# Patient Record
Sex: Female | Born: 1945
Health system: Southern US, Community
[De-identification: ages and names within clinical notes are randomized; demographics above are authoritative.]

## PROBLEM LIST (undated history)

## (undated) DIAGNOSIS — I700 Atherosclerosis of aorta: Secondary | ICD-10-CM

## (undated) DIAGNOSIS — Z87442 Personal history of urinary calculi: Secondary | ICD-10-CM

## (undated) DIAGNOSIS — I69354 Hemiplegia and hemiparesis following cerebral infarction affecting left non-dominant side: Secondary | ICD-10-CM

## (undated) DIAGNOSIS — E538 Deficiency of other specified B group vitamins: Secondary | ICD-10-CM

## (undated) DIAGNOSIS — M069 Rheumatoid arthritis, unspecified: Secondary | ICD-10-CM

## (undated) DIAGNOSIS — D509 Iron deficiency anemia, unspecified: Secondary | ICD-10-CM

## (undated) DIAGNOSIS — C801 Malignant (primary) neoplasm, unspecified: Secondary | ICD-10-CM

## (undated) DIAGNOSIS — K219 Gastro-esophageal reflux disease without esophagitis: Secondary | ICD-10-CM

## (undated) DIAGNOSIS — M549 Dorsalgia, unspecified: Secondary | ICD-10-CM

## (undated) DIAGNOSIS — E119 Type 2 diabetes mellitus without complications: Secondary | ICD-10-CM

## (undated) DIAGNOSIS — G5601 Carpal tunnel syndrome, right upper limb: Secondary | ICD-10-CM

## (undated) DIAGNOSIS — J45909 Unspecified asthma, uncomplicated: Secondary | ICD-10-CM

## (undated) DIAGNOSIS — E1140 Type 2 diabetes mellitus with diabetic neuropathy, unspecified: Secondary | ICD-10-CM

## (undated) DIAGNOSIS — I10 Essential (primary) hypertension: Secondary | ICD-10-CM

## (undated) DIAGNOSIS — IMO0001 Reserved for inherently not codable concepts without codable children: Secondary | ICD-10-CM

## (undated) DIAGNOSIS — M659 Unspecified synovitis and tenosynovitis, unspecified site: Secondary | ICD-10-CM

## (undated) DIAGNOSIS — C679 Malignant neoplasm of bladder, unspecified: Secondary | ICD-10-CM

## (undated) DIAGNOSIS — D494 Neoplasm of unspecified behavior of bladder: Secondary | ICD-10-CM

## (undated) DIAGNOSIS — M1990 Unspecified osteoarthritis, unspecified site: Secondary | ICD-10-CM

## (undated) DIAGNOSIS — F329 Major depressive disorder, single episode, unspecified: Secondary | ICD-10-CM

## (undated) DIAGNOSIS — E785 Hyperlipidemia, unspecified: Secondary | ICD-10-CM

## (undated) DIAGNOSIS — G629 Polyneuropathy, unspecified: Secondary | ICD-10-CM

## (undated) DIAGNOSIS — R319 Hematuria, unspecified: Secondary | ICD-10-CM

## (undated) DIAGNOSIS — R079 Chest pain, unspecified: Secondary | ICD-10-CM

## (undated) DIAGNOSIS — G4700 Insomnia, unspecified: Secondary | ICD-10-CM

## (undated) DIAGNOSIS — R0600 Dyspnea, unspecified: Secondary | ICD-10-CM

## (undated) DIAGNOSIS — J449 Chronic obstructive pulmonary disease, unspecified: Secondary | ICD-10-CM

## (undated) DIAGNOSIS — M8580 Other specified disorders of bone density and structure, unspecified site: Secondary | ICD-10-CM

## (undated) DIAGNOSIS — H269 Unspecified cataract: Secondary | ICD-10-CM

## (undated) DIAGNOSIS — M5090 Cervical disc disorder, unspecified, unspecified cervical region: Secondary | ICD-10-CM

## (undated) DIAGNOSIS — I6529 Occlusion and stenosis of unspecified carotid artery: Secondary | ICD-10-CM

## (undated) DIAGNOSIS — I639 Cerebral infarction, unspecified: Secondary | ICD-10-CM

## (undated) DIAGNOSIS — D649 Anemia, unspecified: Secondary | ICD-10-CM

## (undated) DIAGNOSIS — F32A Depression, unspecified: Secondary | ICD-10-CM

## (undated) DIAGNOSIS — G4730 Sleep apnea, unspecified: Secondary | ICD-10-CM

## (undated) HISTORY — DX: Type 2 diabetes mellitus without complications: E11.9

## (undated) HISTORY — DX: Unspecified synovitis and tenosynovitis, unspecified site: M65.90

## (undated) HISTORY — DX: Cervical disc disorder, unspecified, unspecified cervical region: M50.90

## (undated) HISTORY — DX: Other specified disorders of bone density and structure, unspecified site: M85.80

## (undated) HISTORY — DX: Depression, unspecified: F32.A

## (undated) HISTORY — DX: Polyneuropathy, unspecified: G62.9

## (undated) HISTORY — DX: Unspecified cataract: H26.9

## (undated) HISTORY — DX: Type 2 diabetes mellitus with diabetic neuropathy, unspecified: E11.40

## (undated) HISTORY — DX: Rheumatoid arthritis, unspecified: M06.9

## (undated) HISTORY — PX: NECK SURGERY: SHX720

## (undated) HISTORY — DX: Insomnia, unspecified: G47.00

## (undated) HISTORY — DX: Synovitis and tenosynovitis, unspecified: M65.9

## (undated) HISTORY — PX: OVARIAN CYST REMOVAL: SHX89

## (undated) HISTORY — PX: BREAST BIOPSY: SHX20

## (undated) HISTORY — DX: Unspecified osteoarthritis, unspecified site: M19.90

## (undated) HISTORY — DX: Gastro-esophageal reflux disease without esophagitis: K21.9

## (undated) HISTORY — DX: Cerebral infarction, unspecified: I63.9

## (undated) HISTORY — DX: Chronic obstructive pulmonary disease, unspecified: J44.9

## (undated) HISTORY — DX: Sleep apnea, unspecified: G47.30

## (undated) HISTORY — DX: Major depressive disorder, single episode, unspecified: F32.9

## (undated) HISTORY — DX: Chest pain, unspecified: R07.9

## (undated) HISTORY — DX: Hyperlipidemia, unspecified: E78.5

## (undated) HISTORY — DX: Dorsalgia, unspecified: M54.9

## (undated) HISTORY — DX: Reserved for inherently not codable concepts without codable children: IMO0001

---

## 2004-02-03 ENCOUNTER — Other Ambulatory Visit: Payer: Self-pay

## 2005-09-25 ENCOUNTER — Other Ambulatory Visit: Payer: Self-pay

## 2006-01-16 LAB — HM COLONOSCOPY: HM Colonoscopy: NORMAL

## 2006-09-24 ENCOUNTER — Other Ambulatory Visit: Payer: Self-pay

## 2008-04-05 ENCOUNTER — Emergency Department: Payer: Self-pay | Admitting: Emergency Medicine

## 2008-04-05 ENCOUNTER — Other Ambulatory Visit: Payer: Self-pay

## 2008-04-26 ENCOUNTER — Emergency Department: Payer: Self-pay | Admitting: Emergency Medicine

## 2008-07-04 ENCOUNTER — Ambulatory Visit: Payer: Self-pay | Admitting: Family Medicine

## 2008-11-04 ENCOUNTER — Ambulatory Visit: Payer: Self-pay | Admitting: Family Medicine

## 2008-12-08 ENCOUNTER — Ambulatory Visit: Payer: Self-pay | Admitting: Unknown Physician Specialty

## 2008-12-15 ENCOUNTER — Ambulatory Visit: Payer: Self-pay | Admitting: Unknown Physician Specialty

## 2009-11-09 ENCOUNTER — Emergency Department: Payer: Self-pay | Admitting: Emergency Medicine

## 2009-11-12 ENCOUNTER — Emergency Department: Payer: Self-pay | Admitting: Internal Medicine

## 2010-08-04 LAB — HM DEXA SCAN: HM Dexa Scan: NORMAL

## 2010-12-02 ENCOUNTER — Emergency Department: Payer: Self-pay | Admitting: Emergency Medicine

## 2011-06-03 ENCOUNTER — Emergency Department: Payer: Self-pay | Admitting: Unknown Physician Specialty

## 2012-06-20 DIAGNOSIS — Z23 Encounter for immunization: Secondary | ICD-10-CM | POA: Diagnosis not present

## 2013-02-14 ENCOUNTER — Ambulatory Visit: Payer: Self-pay | Admitting: Family Medicine

## 2013-03-14 ENCOUNTER — Ambulatory Visit: Payer: Self-pay | Admitting: Family Medicine

## 2013-06-10 DIAGNOSIS — J449 Chronic obstructive pulmonary disease, unspecified: Secondary | ICD-10-CM | POA: Diagnosis not present

## 2013-06-10 DIAGNOSIS — E785 Hyperlipidemia, unspecified: Secondary | ICD-10-CM | POA: Diagnosis not present

## 2013-06-10 DIAGNOSIS — Z23 Encounter for immunization: Secondary | ICD-10-CM | POA: Diagnosis not present

## 2013-06-10 DIAGNOSIS — IMO0001 Reserved for inherently not codable concepts without codable children: Secondary | ICD-10-CM | POA: Diagnosis not present

## 2013-06-10 DIAGNOSIS — Z1331 Encounter for screening for depression: Secondary | ICD-10-CM | POA: Diagnosis not present

## 2013-09-04 DIAGNOSIS — G4700 Insomnia, unspecified: Secondary | ICD-10-CM | POA: Diagnosis not present

## 2013-09-04 DIAGNOSIS — IMO0001 Reserved for inherently not codable concepts without codable children: Secondary | ICD-10-CM | POA: Diagnosis not present

## 2013-09-04 DIAGNOSIS — K219 Gastro-esophageal reflux disease without esophagitis: Secondary | ICD-10-CM | POA: Diagnosis not present

## 2013-09-04 DIAGNOSIS — E1149 Type 2 diabetes mellitus with other diabetic neurological complication: Secondary | ICD-10-CM | POA: Diagnosis not present

## 2013-09-18 DIAGNOSIS — I639 Cerebral infarction, unspecified: Secondary | ICD-10-CM

## 2013-09-18 HISTORY — DX: Cerebral infarction, unspecified: I63.9

## 2013-10-10 DIAGNOSIS — E559 Vitamin D deficiency, unspecified: Secondary | ICD-10-CM | POA: Diagnosis not present

## 2013-10-10 DIAGNOSIS — Z79899 Other long term (current) drug therapy: Secondary | ICD-10-CM | POA: Diagnosis not present

## 2013-10-10 DIAGNOSIS — M2550 Pain in unspecified joint: Secondary | ICD-10-CM | POA: Diagnosis not present

## 2013-10-10 DIAGNOSIS — M65849 Other synovitis and tenosynovitis, unspecified hand: Secondary | ICD-10-CM | POA: Diagnosis not present

## 2013-10-10 DIAGNOSIS — E785 Hyperlipidemia, unspecified: Secondary | ICD-10-CM | POA: Diagnosis not present

## 2013-10-10 DIAGNOSIS — IMO0001 Reserved for inherently not codable concepts without codable children: Secondary | ICD-10-CM | POA: Diagnosis not present

## 2013-10-10 DIAGNOSIS — E538 Deficiency of other specified B group vitamins: Secondary | ICD-10-CM | POA: Diagnosis not present

## 2013-10-10 DIAGNOSIS — M65839 Other synovitis and tenosynovitis, unspecified forearm: Secondary | ICD-10-CM | POA: Diagnosis not present

## 2013-10-10 DIAGNOSIS — Q846 Other congenital malformations of nails: Secondary | ICD-10-CM | POA: Diagnosis not present

## 2013-10-17 ENCOUNTER — Encounter: Payer: Self-pay | Admitting: Podiatry

## 2013-10-31 ENCOUNTER — Ambulatory Visit (INDEPENDENT_AMBULATORY_CARE_PROVIDER_SITE_OTHER): Payer: Medicare Other | Admitting: Podiatry

## 2013-10-31 ENCOUNTER — Encounter: Payer: Self-pay | Admitting: Podiatry

## 2013-10-31 VITALS — BP 132/81 | HR 82 | Resp 16 | Ht 64.0 in | Wt 167.0 lb

## 2013-10-31 DIAGNOSIS — E1159 Type 2 diabetes mellitus with other circulatory complications: Secondary | ICD-10-CM | POA: Diagnosis not present

## 2013-10-31 DIAGNOSIS — Q828 Other specified congenital malformations of skin: Secondary | ICD-10-CM | POA: Diagnosis not present

## 2013-10-31 DIAGNOSIS — B351 Tinea unguium: Secondary | ICD-10-CM | POA: Diagnosis not present

## 2013-10-31 DIAGNOSIS — M79609 Pain in unspecified limb: Secondary | ICD-10-CM

## 2013-10-31 NOTE — Progress Notes (Signed)
   Subjective:    Patient ID: Kendra [name], female    DOB: 1946/01/14, 68 y.o.   MRN: [id number]  HPI Comments: Primary doctor set up this appointment, i have fungus under my toenails. They are thick and painful, hard to trim self. Diabetic for 7 years and my sugars been running high, she has me on two types of insulin now for it   Diabetes Hypoglycemia symptoms include nervousness/anxiousness.      Review of Systems  Constitutional:       Weight changes   Respiratory:       Difficulty breathing  Shortness of breath  wheezing  Endocrine:       Diabetic  Excessive thirst    Musculoskeletal:       Muscle pain Joint pain  Difficulty walking   Psychiatric/Behavioral: The patient is nervous/anxious.   All other systems reviewed and are negative.       Objective:   Physical Exam        Assessment & Plan:

## 2013-10-31 NOTE — Progress Notes (Signed)
Subjective:     Patient ID: Kendra [name], female   DOB: [birth year], 68 y.o.   MRN: [id number]  Diabetes   patient presents stating she has painful nails of both feet that she cannot cut and she has very bad lesions on both feet the become very painful and are impossible for her to cut. She is a long-term diabetic and her sugars have been running high and she is to see Dr. in the next several days to try to get it under better control. States her sugars been running around 300 350   Review of Systems  All other systems reviewed and are negative.       Objective:   Physical Exam  Nursing note and vitals reviewed. Constitutional: She is oriented to person, place, and time.  Musculoskeletal: Normal range of motion.  Neurological: She is oriented to person, place, and time.  Skin: Skin is dry.   vascular status is diminished with pulses running +1/4 DP and PT. Neurological is diminished but sharp dull and vibratory and range of motion was found to be adequate. No equinus condition was noted and there is severe keratotic lesions hallux bilateral sub-one and sub-fifth metatarsals bilateral with thickness and pain when pressed. Nailbeds are thickened 1-5 both feet and impossible for her to cut     Assessment:     Diabetic x7 years who is not in good control with neurological and vascular manifestations and severe nail disease and lesion formation of both feet    Plan:     Diabetic education rendered and importance of control was told to patient. I debrided nailbeds 1-5 both feet with no iatrogenic bleeding and all lesions. Patient had a small amount of bleeding on right hallux for which I applied Neosporin and bandage and instructed on soaks and if any redness or swelling were to occur to let us know immediately. She is to see her family physician and we have also recommended diabetic shoes for long term and we'll get authorization to have these made

## 2013-11-30 ENCOUNTER — Inpatient Hospital Stay: Payer: Self-pay | Admitting: Internal Medicine

## 2013-11-30 DIAGNOSIS — F17200 Nicotine dependence, unspecified, uncomplicated: Secondary | ICD-10-CM | POA: Diagnosis not present

## 2013-11-30 DIAGNOSIS — R42 Dizziness and giddiness: Secondary | ICD-10-CM | POA: Diagnosis not present

## 2013-11-30 DIAGNOSIS — R059 Cough, unspecified: Secondary | ICD-10-CM | POA: Diagnosis not present

## 2013-11-30 DIAGNOSIS — I10 Essential (primary) hypertension: Secondary | ICD-10-CM | POA: Diagnosis present

## 2013-11-30 DIAGNOSIS — R05 Cough: Secondary | ICD-10-CM | POA: Diagnosis not present

## 2013-11-30 DIAGNOSIS — J069 Acute upper respiratory infection, unspecified: Secondary | ICD-10-CM | POA: Diagnosis not present

## 2013-11-30 DIAGNOSIS — R0602 Shortness of breath: Secondary | ICD-10-CM | POA: Diagnosis not present

## 2013-11-30 DIAGNOSIS — J209 Acute bronchitis, unspecified: Secondary | ICD-10-CM | POA: Diagnosis not present

## 2013-11-30 DIAGNOSIS — E119 Type 2 diabetes mellitus without complications: Secondary | ICD-10-CM | POA: Diagnosis not present

## 2013-11-30 DIAGNOSIS — J441 Chronic obstructive pulmonary disease with (acute) exacerbation: Secondary | ICD-10-CM | POA: Diagnosis not present

## 2013-11-30 DIAGNOSIS — J440 Chronic obstructive pulmonary disease with acute lower respiratory infection: Secondary | ICD-10-CM | POA: Diagnosis present

## 2013-11-30 DIAGNOSIS — R509 Fever, unspecified: Secondary | ICD-10-CM | POA: Diagnosis not present

## 2013-11-30 DIAGNOSIS — K219 Gastro-esophageal reflux disease without esophagitis: Secondary | ICD-10-CM | POA: Diagnosis not present

## 2013-11-30 DIAGNOSIS — A419 Sepsis, unspecified organism: Secondary | ICD-10-CM | POA: Diagnosis not present

## 2013-11-30 LAB — BASIC METABOLIC PANEL
Anion Gap: 7 (ref 7–16)
BUN: 5 mg/dL — ABNORMAL LOW (ref 7–18)
Calcium, Total: 9.5 mg/dL (ref 8.5–10.1)
Chloride: 102 mmol/L (ref 98–107)
Co2: 27 mmol/L (ref 21–32)
Creatinine: 0.76 mg/dL (ref 0.60–1.30)
EGFR (African American): >60
EGFR (Non-African Amer.): >60
Glucose: 182 mg/dL — ABNORMAL HIGH (ref 65–99)
Osmolality: 274 (ref 275–301)
Potassium: 3.6 mmol/L (ref 3.5–5.1)
Sodium: 136 mmol/L (ref 136–145)

## 2013-11-30 LAB — CBC
HCT: 39.8 % (ref 35.0–47.0)
HGB: 12.9 g/dL (ref 12.0–16.0)
MCH: 28.7 pg (ref 26.0–34.0)
MCHC: 32.4 g/dL (ref 32.0–36.0)
MCV: 89 fL (ref 80–100)
Platelet: 250 10*3/uL (ref 150–440)
RBC: 4.48 10*6/uL (ref 3.80–5.20)
RDW: 13.8 % (ref 11.5–14.5)
WBC: 6 10*3/uL (ref 3.6–11.0)

## 2013-11-30 LAB — RAPID INFLUENZA A&B ANTIGENS

## 2013-11-30 LAB — TROPONIN I: Troponin-I: <0.02 ng/mL

## 2013-12-01 DIAGNOSIS — A419 Sepsis, unspecified organism: Secondary | ICD-10-CM | POA: Diagnosis not present

## 2013-12-01 DIAGNOSIS — J209 Acute bronchitis, unspecified: Secondary | ICD-10-CM | POA: Diagnosis not present

## 2013-12-01 DIAGNOSIS — J441 Chronic obstructive pulmonary disease with (acute) exacerbation: Secondary | ICD-10-CM | POA: Diagnosis not present

## 2013-12-01 DIAGNOSIS — E119 Type 2 diabetes mellitus without complications: Secondary | ICD-10-CM | POA: Diagnosis not present

## 2013-12-01 DIAGNOSIS — F17200 Nicotine dependence, unspecified, uncomplicated: Secondary | ICD-10-CM | POA: Diagnosis not present

## 2013-12-06 LAB — CULTURE, BLOOD (SINGLE)

## 2013-12-15 DIAGNOSIS — IMO0001 Reserved for inherently not codable concepts without codable children: Secondary | ICD-10-CM | POA: Diagnosis not present

## 2013-12-15 DIAGNOSIS — J441 Chronic obstructive pulmonary disease with (acute) exacerbation: Secondary | ICD-10-CM | POA: Diagnosis not present

## 2013-12-15 DIAGNOSIS — Z09 Encounter for follow-up examination after completed treatment for conditions other than malignant neoplasm: Secondary | ICD-10-CM | POA: Diagnosis not present

## 2013-12-15 DIAGNOSIS — G4700 Insomnia, unspecified: Secondary | ICD-10-CM | POA: Diagnosis not present

## 2014-01-20 ENCOUNTER — Ambulatory Visit: Payer: Medicare Other | Admitting: Pulmonary Disease

## 2014-01-20 NOTE — Progress Notes (Signed)
No show

## 2014-01-30 ENCOUNTER — Ambulatory Visit: Payer: Medicare Other | Admitting: Podiatry

## 2014-02-06 ENCOUNTER — Ambulatory Visit: Payer: Medicare Other | Admitting: Podiatry

## 2014-02-17 ENCOUNTER — Institutional Professional Consult (permissible substitution): Payer: Self-pay | Admitting: Pulmonary Disease

## 2014-02-25 ENCOUNTER — Ambulatory Visit: Payer: Self-pay | Admitting: Family Medicine

## 2014-02-25 DIAGNOSIS — Z1231 Encounter for screening mammogram for malignant neoplasm of breast: Secondary | ICD-10-CM | POA: Diagnosis not present

## 2014-02-25 LAB — HM MAMMOGRAPHY: HM Mammogram: NORMAL

## 2014-03-30 ENCOUNTER — Institutional Professional Consult (permissible substitution): Payer: Self-pay | Admitting: Pulmonary Disease

## 2014-05-19 ENCOUNTER — Emergency Department: Payer: Self-pay | Admitting: Internal Medicine

## 2014-05-19 DIAGNOSIS — R079 Chest pain, unspecified: Secondary | ICD-10-CM | POA: Diagnosis not present

## 2014-05-19 DIAGNOSIS — F411 Generalized anxiety disorder: Secondary | ICD-10-CM | POA: Diagnosis not present

## 2014-05-19 DIAGNOSIS — Z794 Long term (current) use of insulin: Secondary | ICD-10-CM | POA: Diagnosis not present

## 2014-05-19 DIAGNOSIS — R0789 Other chest pain: Secondary | ICD-10-CM | POA: Diagnosis not present

## 2014-05-19 DIAGNOSIS — R0602 Shortness of breath: Secondary | ICD-10-CM | POA: Diagnosis not present

## 2014-05-19 DIAGNOSIS — R05 Cough: Secondary | ICD-10-CM | POA: Diagnosis not present

## 2014-05-19 DIAGNOSIS — Z79899 Other long term (current) drug therapy: Secondary | ICD-10-CM | POA: Diagnosis not present

## 2014-05-19 DIAGNOSIS — E119 Type 2 diabetes mellitus without complications: Secondary | ICD-10-CM | POA: Diagnosis not present

## 2014-05-19 DIAGNOSIS — I10 Essential (primary) hypertension: Secondary | ICD-10-CM | POA: Diagnosis not present

## 2014-05-19 DIAGNOSIS — F17200 Nicotine dependence, unspecified, uncomplicated: Secondary | ICD-10-CM | POA: Diagnosis not present

## 2014-05-19 DIAGNOSIS — J440 Chronic obstructive pulmonary disease with acute lower respiratory infection: Secondary | ICD-10-CM | POA: Diagnosis not present

## 2014-05-19 DIAGNOSIS — R059 Cough, unspecified: Secondary | ICD-10-CM | POA: Diagnosis not present

## 2014-05-19 DIAGNOSIS — R0609 Other forms of dyspnea: Secondary | ICD-10-CM | POA: Diagnosis not present

## 2014-05-19 DIAGNOSIS — J209 Acute bronchitis, unspecified: Secondary | ICD-10-CM | POA: Diagnosis not present

## 2014-05-19 LAB — COMPREHENSIVE METABOLIC PANEL
Albumin: 3.3 g/dL — ABNORMAL LOW (ref 3.4–5.0)
Alkaline Phosphatase: 82 U/L
Anion Gap: 7 (ref 7–16)
BUN: 6 mg/dL — ABNORMAL LOW (ref 7–18)
Bilirubin,Total: 0.3 mg/dL (ref 0.2–1.0)
Calcium, Total: 8.4 mg/dL — ABNORMAL LOW (ref 8.5–10.1)
Chloride: 110 mmol/L — ABNORMAL HIGH (ref 98–107)
Co2: 26 mmol/L (ref 21–32)
Creatinine: 0.79 mg/dL (ref 0.60–1.30)
EGFR (African American): >60
EGFR (Non-African Amer.): >60
Glucose: 128 mg/dL — ABNORMAL HIGH (ref 65–99)
Osmolality: 284 (ref 275–301)
Potassium: 3.2 mmol/L — ABNORMAL LOW (ref 3.5–5.1)
SGOT(AST): 28 U/L (ref 15–37)
SGPT (ALT): 26 U/L
Sodium: 143 mmol/L (ref 136–145)
Total Protein: 6.5 g/dL (ref 6.4–8.2)

## 2014-05-19 LAB — URINALYSIS, COMPLETE
Bacteria: NONE SEEN
Bilirubin,UR: NEGATIVE
Blood: NEGATIVE
Glucose,UR: NEGATIVE mg/dL (ref 0–75)
Ketone: NEGATIVE
Leukocyte Esterase: NEGATIVE
Nitrite: NEGATIVE
Ph: 6 (ref 4.5–8.0)
Protein: NEGATIVE
RBC,UR: <1 /HPF (ref 0–5)
Specific Gravity: 1.002 (ref 1.003–1.030)
Squamous Epithelial: NONE SEEN
WBC UR: <1 /HPF (ref 0–5)

## 2014-05-19 LAB — CBC
HCT: 35.8 % (ref 35.0–47.0)
HGB: 12.1 g/dL (ref 12.0–16.0)
MCH: 30.3 pg (ref 26.0–34.0)
MCHC: 33.9 g/dL (ref 32.0–36.0)
MCV: 89 fL (ref 80–100)
Platelet: 250 10*3/uL (ref 150–440)
RBC: 4.01 10*6/uL (ref 3.80–5.20)
RDW: 13.4 % (ref 11.5–14.5)
WBC: 5.7 10*3/uL (ref 3.6–11.0)

## 2014-05-19 LAB — TROPONIN I: Troponin-I: <0.02 ng/mL

## 2014-05-19 LAB — PRO B NATRIURETIC PEPTIDE: B-Type Natriuretic Peptide: 78 pg/mL (ref 0–125)

## 2014-05-26 DIAGNOSIS — J440 Chronic obstructive pulmonary disease with acute lower respiratory infection: Secondary | ICD-10-CM | POA: Diagnosis not present

## 2014-05-26 DIAGNOSIS — E785 Hyperlipidemia, unspecified: Secondary | ICD-10-CM | POA: Diagnosis not present

## 2014-05-26 DIAGNOSIS — Z23 Encounter for immunization: Secondary | ICD-10-CM | POA: Diagnosis not present

## 2014-05-26 DIAGNOSIS — IMO0001 Reserved for inherently not codable concepts without codable children: Secondary | ICD-10-CM | POA: Diagnosis not present

## 2014-06-29 DIAGNOSIS — E559 Vitamin D deficiency, unspecified: Secondary | ICD-10-CM | POA: Diagnosis not present

## 2014-06-29 DIAGNOSIS — Z5181 Encounter for therapeutic drug level monitoring: Secondary | ICD-10-CM | POA: Diagnosis not present

## 2014-06-29 DIAGNOSIS — E538 Deficiency of other specified B group vitamins: Secondary | ICD-10-CM | POA: Diagnosis not present

## 2014-06-29 DIAGNOSIS — E1143 Type 2 diabetes mellitus with diabetic autonomic (poly)neuropathy: Secondary | ICD-10-CM | POA: Diagnosis not present

## 2014-06-29 DIAGNOSIS — E1165 Type 2 diabetes mellitus with hyperglycemia: Secondary | ICD-10-CM | POA: Diagnosis not present

## 2014-06-29 DIAGNOSIS — J449 Chronic obstructive pulmonary disease, unspecified: Secondary | ICD-10-CM | POA: Diagnosis not present

## 2014-06-29 DIAGNOSIS — E785 Hyperlipidemia, unspecified: Secondary | ICD-10-CM | POA: Diagnosis not present

## 2014-06-29 LAB — LIPID PANEL
Cholesterol: 121 mg/dL (ref 0–200)
HDL: 47 mg/dL (ref 35–70)
LDL Cholesterol: 54 mg/dL
Triglycerides: 101 mg/dL (ref 40–160)

## 2014-08-01 ENCOUNTER — Emergency Department: Payer: Self-pay | Admitting: Emergency Medicine

## 2014-08-01 DIAGNOSIS — Z720 Tobacco use: Secondary | ICD-10-CM | POA: Diagnosis not present

## 2014-08-01 DIAGNOSIS — S92911A Unspecified fracture of right toe(s), initial encounter for closed fracture: Secondary | ICD-10-CM | POA: Diagnosis not present

## 2014-08-01 DIAGNOSIS — I10 Essential (primary) hypertension: Secondary | ICD-10-CM | POA: Diagnosis not present

## 2014-08-01 DIAGNOSIS — S99921A Unspecified injury of right foot, initial encounter: Secondary | ICD-10-CM | POA: Diagnosis not present

## 2014-08-01 DIAGNOSIS — E109 Type 1 diabetes mellitus without complications: Secondary | ICD-10-CM | POA: Diagnosis not present

## 2014-08-01 DIAGNOSIS — S92511A Displaced fracture of proximal phalanx of right lesser toe(s), initial encounter for closed fracture: Secondary | ICD-10-CM | POA: Diagnosis not present

## 2014-08-11 DIAGNOSIS — L851 Acquired keratosis [keratoderma] palmaris et plantaris: Secondary | ICD-10-CM | POA: Diagnosis not present

## 2014-08-11 DIAGNOSIS — B351 Tinea unguium: Secondary | ICD-10-CM | POA: Diagnosis not present

## 2014-08-11 DIAGNOSIS — E1142 Type 2 diabetes mellitus with diabetic polyneuropathy: Secondary | ICD-10-CM | POA: Diagnosis not present

## 2014-08-11 DIAGNOSIS — S92514A Nondisplaced fracture of proximal phalanx of right lesser toe(s), initial encounter for closed fracture: Secondary | ICD-10-CM | POA: Diagnosis not present

## 2014-08-12 DIAGNOSIS — B351 Tinea unguium: Secondary | ICD-10-CM | POA: Insufficient documentation

## 2014-08-12 DIAGNOSIS — S92919A Unspecified fracture of unspecified toe(s), initial encounter for closed fracture: Secondary | ICD-10-CM | POA: Insufficient documentation

## 2014-08-12 DIAGNOSIS — E1142 Type 2 diabetes mellitus with diabetic polyneuropathy: Secondary | ICD-10-CM | POA: Insufficient documentation

## 2014-08-25 DIAGNOSIS — S92514D Nondisplaced fracture of proximal phalanx of right lesser toe(s), subsequent encounter for fracture with routine healing: Secondary | ICD-10-CM | POA: Diagnosis not present

## 2014-09-01 DIAGNOSIS — R51 Headache: Secondary | ICD-10-CM | POA: Diagnosis not present

## 2014-09-01 DIAGNOSIS — I6789 Other cerebrovascular disease: Secondary | ICD-10-CM | POA: Diagnosis not present

## 2014-09-01 DIAGNOSIS — E119 Type 2 diabetes mellitus without complications: Secondary | ICD-10-CM | POA: Diagnosis not present

## 2014-09-01 DIAGNOSIS — R739 Hyperglycemia, unspecified: Secondary | ICD-10-CM | POA: Diagnosis not present

## 2014-09-01 DIAGNOSIS — K219 Gastro-esophageal reflux disease without esophagitis: Secondary | ICD-10-CM | POA: Diagnosis not present

## 2014-09-01 DIAGNOSIS — R531 Weakness: Secondary | ICD-10-CM | POA: Diagnosis not present

## 2014-09-01 DIAGNOSIS — G459 Transient cerebral ischemic attack, unspecified: Secondary | ICD-10-CM | POA: Diagnosis not present

## 2014-09-01 DIAGNOSIS — J449 Chronic obstructive pulmonary disease, unspecified: Secondary | ICD-10-CM | POA: Diagnosis not present

## 2014-09-01 DIAGNOSIS — I639 Cerebral infarction, unspecified: Secondary | ICD-10-CM | POA: Diagnosis not present

## 2014-09-01 DIAGNOSIS — H539 Unspecified visual disturbance: Secondary | ICD-10-CM | POA: Diagnosis not present

## 2014-09-01 LAB — URINALYSIS, COMPLETE
Bacteria: NONE SEEN
Bilirubin,UR: NEGATIVE
Blood: NEGATIVE
Glucose,UR: >=500 mg/dL (ref 0–75)
Ketone: NEGATIVE
Leukocyte Esterase: NEGATIVE
Nitrite: NEGATIVE
Ph: 5 (ref 4.5–8.0)
Protein: NEGATIVE
RBC,UR: 1 /HPF (ref 0–5)
Specific Gravity: 1.027 (ref 1.003–1.030)
Squamous Epithelial: 1
WBC UR: 4 /HPF (ref 0–5)

## 2014-09-01 LAB — CBC
HCT: 37.9 % (ref 35.0–47.0)
HGB: 12.7 g/dL (ref 12.0–16.0)
MCH: 30.8 pg (ref 26.0–34.0)
MCHC: 33.5 g/dL (ref 32.0–36.0)
MCV: 92 fL (ref 80–100)
Platelet: 270 10*3/uL (ref 150–440)
RBC: 4.12 10*6/uL (ref 3.80–5.20)
RDW: 13.9 % (ref 11.5–14.5)
WBC: 6.9 10*3/uL (ref 3.6–11.0)

## 2014-09-01 LAB — TROPONIN I: Troponin-I: <0.02 ng/mL

## 2014-09-01 LAB — COMPREHENSIVE METABOLIC PANEL
Albumin: 3.5 g/dL (ref 3.4–5.0)
Alkaline Phosphatase: 112 U/L
Anion Gap: 6 — ABNORMAL LOW (ref 7–16)
BUN: 14 mg/dL (ref 7–18)
Bilirubin,Total: 0.2 mg/dL (ref 0.2–1.0)
Calcium, Total: 8.9 mg/dL (ref 8.5–10.1)
Chloride: 104 mmol/L (ref 98–107)
Co2: 28 mmol/L (ref 21–32)
Creatinine: 0.9 mg/dL (ref 0.60–1.30)
EGFR (African American): >60
EGFR (Non-African Amer.): >60
Glucose: 287 mg/dL — ABNORMAL HIGH (ref 65–99)
Osmolality: 287 (ref 275–301)
Potassium: 3.5 mmol/L (ref 3.5–5.1)
SGOT(AST): 20 U/L (ref 15–37)
SGPT (ALT): 24 U/L
Sodium: 138 mmol/L (ref 136–145)
Total Protein: 7.2 g/dL (ref 6.4–8.2)

## 2014-09-01 LAB — LIPASE, BLOOD: Lipase: 69 U/L — ABNORMAL LOW (ref 73–393)

## 2014-09-02 ENCOUNTER — Inpatient Hospital Stay: Payer: Self-pay | Admitting: Specialist

## 2014-09-02 DIAGNOSIS — M6281 Muscle weakness (generalized): Secondary | ICD-10-CM | POA: Diagnosis not present

## 2014-09-02 DIAGNOSIS — K219 Gastro-esophageal reflux disease without esophagitis: Secondary | ICD-10-CM | POA: Diagnosis present

## 2014-09-02 DIAGNOSIS — I6523 Occlusion and stenosis of bilateral carotid arteries: Secondary | ICD-10-CM | POA: Diagnosis not present

## 2014-09-02 DIAGNOSIS — J45909 Unspecified asthma, uncomplicated: Secondary | ICD-10-CM | POA: Diagnosis not present

## 2014-09-02 DIAGNOSIS — R51 Headache: Secondary | ICD-10-CM | POA: Diagnosis not present

## 2014-09-02 DIAGNOSIS — Z794 Long term (current) use of insulin: Secondary | ICD-10-CM | POA: Diagnosis not present

## 2014-09-02 DIAGNOSIS — J449 Chronic obstructive pulmonary disease, unspecified: Secondary | ICD-10-CM | POA: Diagnosis not present

## 2014-09-02 DIAGNOSIS — E785 Hyperlipidemia, unspecified: Secondary | ICD-10-CM | POA: Diagnosis present

## 2014-09-02 DIAGNOSIS — E119 Type 2 diabetes mellitus without complications: Secondary | ICD-10-CM | POA: Diagnosis not present

## 2014-09-02 DIAGNOSIS — G8194 Hemiplegia, unspecified affecting left nondominant side: Secondary | ICD-10-CM | POA: Diagnosis not present

## 2014-09-02 DIAGNOSIS — E1165 Type 2 diabetes mellitus with hyperglycemia: Secondary | ICD-10-CM | POA: Diagnosis not present

## 2014-09-02 DIAGNOSIS — Z720 Tobacco use: Secondary | ICD-10-CM | POA: Diagnosis not present

## 2014-09-02 DIAGNOSIS — R531 Weakness: Secondary | ICD-10-CM | POA: Diagnosis not present

## 2014-09-02 DIAGNOSIS — K5900 Constipation, unspecified: Secondary | ICD-10-CM | POA: Diagnosis present

## 2014-09-02 DIAGNOSIS — G459 Transient cerebral ischemic attack, unspecified: Secondary | ICD-10-CM | POA: Diagnosis not present

## 2014-09-02 DIAGNOSIS — I63311 Cerebral infarction due to thrombosis of right middle cerebral artery: Secondary | ICD-10-CM | POA: Diagnosis not present

## 2014-09-02 DIAGNOSIS — F339 Major depressive disorder, recurrent, unspecified: Secondary | ICD-10-CM | POA: Diagnosis not present

## 2014-09-02 DIAGNOSIS — F329 Major depressive disorder, single episode, unspecified: Secondary | ICD-10-CM | POA: Diagnosis present

## 2014-09-02 DIAGNOSIS — R1314 Dysphagia, pharyngoesophageal phase: Secondary | ICD-10-CM | POA: Diagnosis not present

## 2014-09-02 DIAGNOSIS — I10 Essential (primary) hypertension: Secondary | ICD-10-CM | POA: Diagnosis not present

## 2014-09-02 DIAGNOSIS — I639 Cerebral infarction, unspecified: Secondary | ICD-10-CM | POA: Diagnosis not present

## 2014-09-02 DIAGNOSIS — I6529 Occlusion and stenosis of unspecified carotid artery: Secondary | ICD-10-CM | POA: Diagnosis present

## 2014-09-02 DIAGNOSIS — F17200 Nicotine dependence, unspecified, uncomplicated: Secondary | ICD-10-CM | POA: Diagnosis present

## 2014-09-02 DIAGNOSIS — R05 Cough: Secondary | ICD-10-CM | POA: Diagnosis not present

## 2014-09-02 DIAGNOSIS — R739 Hyperglycemia, unspecified: Secondary | ICD-10-CM | POA: Diagnosis not present

## 2014-09-02 LAB — LIPID PANEL
Cholesterol: 141 mg/dL (ref 0–200)
HDL Cholesterol: 37 mg/dL — ABNORMAL LOW (ref 40–60)
Ldl Cholesterol, Calc: 73 mg/dL (ref 0–100)
Triglycerides: 155 mg/dL (ref 0–200)
VLDL Cholesterol, Calc: 31 mg/dL (ref 5–40)

## 2014-09-02 LAB — HEMOGLOBIN A1C: Hemoglobin A1C: 9.5 % — ABNORMAL HIGH (ref 4.2–6.3)

## 2014-09-05 DIAGNOSIS — E780 Pure hypercholesterolemia: Secondary | ICD-10-CM | POA: Diagnosis not present

## 2014-09-05 DIAGNOSIS — J449 Chronic obstructive pulmonary disease, unspecified: Secondary | ICD-10-CM | POA: Diagnosis not present

## 2014-09-05 DIAGNOSIS — F17200 Nicotine dependence, unspecified, uncomplicated: Secondary | ICD-10-CM | POA: Diagnosis not present

## 2014-09-05 DIAGNOSIS — F339 Major depressive disorder, recurrent, unspecified: Secondary | ICD-10-CM | POA: Diagnosis not present

## 2014-09-05 DIAGNOSIS — I69352 Hemiplegia and hemiparesis following cerebral infarction affecting left dominant side: Secondary | ICD-10-CM | POA: Diagnosis not present

## 2014-09-05 DIAGNOSIS — E119 Type 2 diabetes mellitus without complications: Secondary | ICD-10-CM | POA: Diagnosis not present

## 2014-09-05 DIAGNOSIS — G459 Transient cerebral ischemic attack, unspecified: Secondary | ICD-10-CM | POA: Diagnosis not present

## 2014-09-05 DIAGNOSIS — M6281 Muscle weakness (generalized): Secondary | ICD-10-CM | POA: Diagnosis not present

## 2014-09-05 DIAGNOSIS — Z794 Long term (current) use of insulin: Secondary | ICD-10-CM | POA: Diagnosis not present

## 2014-09-05 DIAGNOSIS — I639 Cerebral infarction, unspecified: Secondary | ICD-10-CM | POA: Diagnosis not present

## 2014-09-05 DIAGNOSIS — E785 Hyperlipidemia, unspecified: Secondary | ICD-10-CM | POA: Diagnosis not present

## 2014-09-05 DIAGNOSIS — K219 Gastro-esophageal reflux disease without esophagitis: Secondary | ICD-10-CM | POA: Diagnosis not present

## 2014-09-05 DIAGNOSIS — Z720 Tobacco use: Secondary | ICD-10-CM | POA: Diagnosis not present

## 2014-09-05 DIAGNOSIS — I10 Essential (primary) hypertension: Secondary | ICD-10-CM | POA: Diagnosis not present

## 2014-09-05 LAB — PLATELET COUNT: Platelet: 260 10*3/uL (ref 150–440)

## 2014-09-08 DIAGNOSIS — F17200 Nicotine dependence, unspecified, uncomplicated: Secondary | ICD-10-CM | POA: Diagnosis not present

## 2014-09-08 DIAGNOSIS — I69352 Hemiplegia and hemiparesis following cerebral infarction affecting left dominant side: Secondary | ICD-10-CM | POA: Diagnosis not present

## 2014-09-18 DIAGNOSIS — I69352 Hemiplegia and hemiparesis following cerebral infarction affecting left dominant side: Secondary | ICD-10-CM | POA: Diagnosis not present

## 2014-09-18 DIAGNOSIS — F17200 Nicotine dependence, unspecified, uncomplicated: Secondary | ICD-10-CM | POA: Diagnosis not present

## 2014-09-23 DIAGNOSIS — Z8673 Personal history of transient ischemic attack (TIA), and cerebral infarction without residual deficits: Secondary | ICD-10-CM | POA: Diagnosis not present

## 2014-09-23 DIAGNOSIS — E119 Type 2 diabetes mellitus without complications: Secondary | ICD-10-CM | POA: Diagnosis not present

## 2014-09-23 DIAGNOSIS — K219 Gastro-esophageal reflux disease without esophagitis: Secondary | ICD-10-CM | POA: Diagnosis not present

## 2014-09-23 DIAGNOSIS — F329 Major depressive disorder, single episode, unspecified: Secondary | ICD-10-CM | POA: Diagnosis not present

## 2014-09-23 DIAGNOSIS — I10 Essential (primary) hypertension: Secondary | ICD-10-CM | POA: Diagnosis not present

## 2014-09-23 DIAGNOSIS — Z794 Long term (current) use of insulin: Secondary | ICD-10-CM | POA: Diagnosis not present

## 2014-09-23 DIAGNOSIS — J449 Chronic obstructive pulmonary disease, unspecified: Secondary | ICD-10-CM | POA: Diagnosis not present

## 2014-09-25 DIAGNOSIS — J449 Chronic obstructive pulmonary disease, unspecified: Secondary | ICD-10-CM | POA: Diagnosis not present

## 2014-09-25 DIAGNOSIS — F329 Major depressive disorder, single episode, unspecified: Secondary | ICD-10-CM | POA: Diagnosis not present

## 2014-09-25 DIAGNOSIS — I10 Essential (primary) hypertension: Secondary | ICD-10-CM | POA: Diagnosis not present

## 2014-09-25 DIAGNOSIS — E119 Type 2 diabetes mellitus without complications: Secondary | ICD-10-CM | POA: Diagnosis not present

## 2014-09-25 DIAGNOSIS — Z8673 Personal history of transient ischemic attack (TIA), and cerebral infarction without residual deficits: Secondary | ICD-10-CM | POA: Diagnosis not present

## 2014-09-25 DIAGNOSIS — K219 Gastro-esophageal reflux disease without esophagitis: Secondary | ICD-10-CM | POA: Diagnosis not present

## 2014-09-29 DIAGNOSIS — J449 Chronic obstructive pulmonary disease, unspecified: Secondary | ICD-10-CM | POA: Diagnosis not present

## 2014-09-29 DIAGNOSIS — K219 Gastro-esophageal reflux disease without esophagitis: Secondary | ICD-10-CM | POA: Diagnosis not present

## 2014-09-29 DIAGNOSIS — E119 Type 2 diabetes mellitus without complications: Secondary | ICD-10-CM | POA: Diagnosis not present

## 2014-09-29 DIAGNOSIS — F329 Major depressive disorder, single episode, unspecified: Secondary | ICD-10-CM | POA: Diagnosis not present

## 2014-09-29 DIAGNOSIS — I10 Essential (primary) hypertension: Secondary | ICD-10-CM | POA: Diagnosis not present

## 2014-09-29 DIAGNOSIS — Z8673 Personal history of transient ischemic attack (TIA), and cerebral infarction without residual deficits: Secondary | ICD-10-CM | POA: Diagnosis not present

## 2014-09-30 DIAGNOSIS — F329 Major depressive disorder, single episode, unspecified: Secondary | ICD-10-CM | POA: Diagnosis not present

## 2014-09-30 DIAGNOSIS — Z8673 Personal history of transient ischemic attack (TIA), and cerebral infarction without residual deficits: Secondary | ICD-10-CM | POA: Diagnosis not present

## 2014-09-30 DIAGNOSIS — J449 Chronic obstructive pulmonary disease, unspecified: Secondary | ICD-10-CM | POA: Diagnosis not present

## 2014-09-30 DIAGNOSIS — E119 Type 2 diabetes mellitus without complications: Secondary | ICD-10-CM | POA: Diagnosis not present

## 2014-09-30 DIAGNOSIS — K219 Gastro-esophageal reflux disease without esophagitis: Secondary | ICD-10-CM | POA: Diagnosis not present

## 2014-09-30 DIAGNOSIS — I10 Essential (primary) hypertension: Secondary | ICD-10-CM | POA: Diagnosis not present

## 2014-10-02 DIAGNOSIS — J449 Chronic obstructive pulmonary disease, unspecified: Secondary | ICD-10-CM | POA: Diagnosis not present

## 2014-10-02 DIAGNOSIS — I10 Essential (primary) hypertension: Secondary | ICD-10-CM | POA: Diagnosis not present

## 2014-10-02 DIAGNOSIS — E119 Type 2 diabetes mellitus without complications: Secondary | ICD-10-CM | POA: Diagnosis not present

## 2014-10-02 DIAGNOSIS — F329 Major depressive disorder, single episode, unspecified: Secondary | ICD-10-CM | POA: Diagnosis not present

## 2014-10-02 DIAGNOSIS — Z8673 Personal history of transient ischemic attack (TIA), and cerebral infarction without residual deficits: Secondary | ICD-10-CM | POA: Diagnosis not present

## 2014-10-02 DIAGNOSIS — K219 Gastro-esophageal reflux disease without esophagitis: Secondary | ICD-10-CM | POA: Diagnosis not present

## 2014-10-05 DIAGNOSIS — I10 Essential (primary) hypertension: Secondary | ICD-10-CM | POA: Diagnosis not present

## 2014-10-05 DIAGNOSIS — Z8673 Personal history of transient ischemic attack (TIA), and cerebral infarction without residual deficits: Secondary | ICD-10-CM | POA: Diagnosis not present

## 2014-10-05 DIAGNOSIS — F329 Major depressive disorder, single episode, unspecified: Secondary | ICD-10-CM | POA: Diagnosis not present

## 2014-10-05 DIAGNOSIS — K219 Gastro-esophageal reflux disease without esophagitis: Secondary | ICD-10-CM | POA: Diagnosis not present

## 2014-10-05 DIAGNOSIS — E119 Type 2 diabetes mellitus without complications: Secondary | ICD-10-CM | POA: Diagnosis not present

## 2014-10-05 DIAGNOSIS — J449 Chronic obstructive pulmonary disease, unspecified: Secondary | ICD-10-CM | POA: Diagnosis not present

## 2014-10-06 DIAGNOSIS — I10 Essential (primary) hypertension: Secondary | ICD-10-CM | POA: Diagnosis not present

## 2014-10-06 DIAGNOSIS — F329 Major depressive disorder, single episode, unspecified: Secondary | ICD-10-CM | POA: Diagnosis not present

## 2014-10-06 DIAGNOSIS — J449 Chronic obstructive pulmonary disease, unspecified: Secondary | ICD-10-CM | POA: Diagnosis not present

## 2014-10-06 DIAGNOSIS — K219 Gastro-esophageal reflux disease without esophagitis: Secondary | ICD-10-CM | POA: Diagnosis not present

## 2014-10-06 DIAGNOSIS — Z8673 Personal history of transient ischemic attack (TIA), and cerebral infarction without residual deficits: Secondary | ICD-10-CM | POA: Diagnosis not present

## 2014-10-06 DIAGNOSIS — E119 Type 2 diabetes mellitus without complications: Secondary | ICD-10-CM | POA: Diagnosis not present

## 2014-10-07 DIAGNOSIS — Z8673 Personal history of transient ischemic attack (TIA), and cerebral infarction without residual deficits: Secondary | ICD-10-CM | POA: Diagnosis not present

## 2014-10-07 DIAGNOSIS — J449 Chronic obstructive pulmonary disease, unspecified: Secondary | ICD-10-CM | POA: Diagnosis not present

## 2014-10-07 DIAGNOSIS — K219 Gastro-esophageal reflux disease without esophagitis: Secondary | ICD-10-CM | POA: Diagnosis not present

## 2014-10-07 DIAGNOSIS — I10 Essential (primary) hypertension: Secondary | ICD-10-CM | POA: Diagnosis not present

## 2014-10-07 DIAGNOSIS — F329 Major depressive disorder, single episode, unspecified: Secondary | ICD-10-CM | POA: Diagnosis not present

## 2014-10-07 DIAGNOSIS — E119 Type 2 diabetes mellitus without complications: Secondary | ICD-10-CM | POA: Diagnosis not present

## 2014-10-13 DIAGNOSIS — J449 Chronic obstructive pulmonary disease, unspecified: Secondary | ICD-10-CM | POA: Diagnosis not present

## 2014-10-13 DIAGNOSIS — F329 Major depressive disorder, single episode, unspecified: Secondary | ICD-10-CM | POA: Diagnosis not present

## 2014-10-13 DIAGNOSIS — I10 Essential (primary) hypertension: Secondary | ICD-10-CM | POA: Diagnosis not present

## 2014-10-13 DIAGNOSIS — Z8673 Personal history of transient ischemic attack (TIA), and cerebral infarction without residual deficits: Secondary | ICD-10-CM | POA: Diagnosis not present

## 2014-10-13 DIAGNOSIS — E119 Type 2 diabetes mellitus without complications: Secondary | ICD-10-CM | POA: Diagnosis not present

## 2014-10-13 DIAGNOSIS — K219 Gastro-esophageal reflux disease without esophagitis: Secondary | ICD-10-CM | POA: Diagnosis not present

## 2014-10-15 DIAGNOSIS — E119 Type 2 diabetes mellitus without complications: Secondary | ICD-10-CM | POA: Diagnosis not present

## 2014-10-15 DIAGNOSIS — Z8673 Personal history of transient ischemic attack (TIA), and cerebral infarction without residual deficits: Secondary | ICD-10-CM | POA: Diagnosis not present

## 2014-10-15 DIAGNOSIS — J449 Chronic obstructive pulmonary disease, unspecified: Secondary | ICD-10-CM | POA: Diagnosis not present

## 2014-10-15 DIAGNOSIS — K219 Gastro-esophageal reflux disease without esophagitis: Secondary | ICD-10-CM | POA: Diagnosis not present

## 2014-10-15 DIAGNOSIS — I10 Essential (primary) hypertension: Secondary | ICD-10-CM | POA: Diagnosis not present

## 2014-10-15 DIAGNOSIS — F329 Major depressive disorder, single episode, unspecified: Secondary | ICD-10-CM | POA: Diagnosis not present

## 2014-10-19 DIAGNOSIS — S92514S Nondisplaced fracture of proximal phalanx of right lesser toe(s), sequela: Secondary | ICD-10-CM | POA: Diagnosis not present

## 2014-10-20 DIAGNOSIS — K219 Gastro-esophageal reflux disease without esophagitis: Secondary | ICD-10-CM | POA: Diagnosis not present

## 2014-10-20 DIAGNOSIS — J449 Chronic obstructive pulmonary disease, unspecified: Secondary | ICD-10-CM | POA: Diagnosis not present

## 2014-10-20 DIAGNOSIS — I10 Essential (primary) hypertension: Secondary | ICD-10-CM | POA: Diagnosis not present

## 2014-10-20 DIAGNOSIS — E119 Type 2 diabetes mellitus without complications: Secondary | ICD-10-CM | POA: Diagnosis not present

## 2014-10-20 DIAGNOSIS — F329 Major depressive disorder, single episode, unspecified: Secondary | ICD-10-CM | POA: Diagnosis not present

## 2014-10-20 DIAGNOSIS — Z8673 Personal history of transient ischemic attack (TIA), and cerebral infarction without residual deficits: Secondary | ICD-10-CM | POA: Diagnosis not present

## 2014-10-21 DIAGNOSIS — I10 Essential (primary) hypertension: Secondary | ICD-10-CM | POA: Diagnosis not present

## 2014-10-21 DIAGNOSIS — G4700 Insomnia, unspecified: Secondary | ICD-10-CM | POA: Diagnosis not present

## 2014-10-21 DIAGNOSIS — J449 Chronic obstructive pulmonary disease, unspecified: Secondary | ICD-10-CM | POA: Diagnosis not present

## 2014-10-21 DIAGNOSIS — E1143 Type 2 diabetes mellitus with diabetic autonomic (poly)neuropathy: Secondary | ICD-10-CM | POA: Diagnosis not present

## 2014-10-21 DIAGNOSIS — K219 Gastro-esophageal reflux disease without esophagitis: Secondary | ICD-10-CM | POA: Diagnosis not present

## 2014-10-21 DIAGNOSIS — Z23 Encounter for immunization: Secondary | ICD-10-CM | POA: Diagnosis not present

## 2014-10-21 DIAGNOSIS — Z8673 Personal history of transient ischemic attack (TIA), and cerebral infarction without residual deficits: Secondary | ICD-10-CM | POA: Diagnosis not present

## 2014-10-21 DIAGNOSIS — E785 Hyperlipidemia, unspecified: Secondary | ICD-10-CM | POA: Diagnosis not present

## 2014-10-21 DIAGNOSIS — G629 Polyneuropathy, unspecified: Secondary | ICD-10-CM | POA: Diagnosis not present

## 2014-10-21 DIAGNOSIS — F329 Major depressive disorder, single episode, unspecified: Secondary | ICD-10-CM | POA: Diagnosis not present

## 2014-10-21 DIAGNOSIS — E119 Type 2 diabetes mellitus without complications: Secondary | ICD-10-CM | POA: Diagnosis not present

## 2014-10-21 LAB — HEMOGLOBIN A1C: Hgb A1c MFr Bld: 6.7 % — AB (ref 4.0–6.0)

## 2014-11-16 DIAGNOSIS — Z1239 Encounter for other screening for malignant neoplasm of breast: Secondary | ICD-10-CM | POA: Diagnosis not present

## 2014-11-16 DIAGNOSIS — Z124 Encounter for screening for malignant neoplasm of cervix: Secondary | ICD-10-CM | POA: Diagnosis not present

## 2014-11-16 DIAGNOSIS — E1143 Type 2 diabetes mellitus with diabetic autonomic (poly)neuropathy: Secondary | ICD-10-CM | POA: Diagnosis not present

## 2014-11-16 DIAGNOSIS — F329 Major depressive disorder, single episode, unspecified: Secondary | ICD-10-CM | POA: Diagnosis not present

## 2014-11-16 DIAGNOSIS — Z9181 History of falling: Secondary | ICD-10-CM | POA: Diagnosis not present

## 2014-11-16 DIAGNOSIS — Z1389 Encounter for screening for other disorder: Secondary | ICD-10-CM | POA: Diagnosis not present

## 2014-11-16 DIAGNOSIS — R6889 Other general symptoms and signs: Secondary | ICD-10-CM | POA: Diagnosis not present

## 2014-11-16 DIAGNOSIS — A630 Anogenital (venereal) warts: Secondary | ICD-10-CM | POA: Diagnosis not present

## 2014-11-16 DIAGNOSIS — Z0000 Encounter for general adult medical examination without abnormal findings: Secondary | ICD-10-CM | POA: Diagnosis not present

## 2014-11-16 LAB — HM PAP SMEAR: HM Pap smear: NORMAL

## 2014-11-30 DIAGNOSIS — M180 Bilateral primary osteoarthritis of first carpometacarpal joints: Secondary | ICD-10-CM | POA: Diagnosis not present

## 2014-12-15 DIAGNOSIS — F331 Major depressive disorder, recurrent, moderate: Secondary | ICD-10-CM | POA: Diagnosis not present

## 2014-12-28 DIAGNOSIS — E538 Deficiency of other specified B group vitamins: Secondary | ICD-10-CM | POA: Diagnosis not present

## 2014-12-28 DIAGNOSIS — Z8673 Personal history of transient ischemic attack (TIA), and cerebral infarction without residual deficits: Secondary | ICD-10-CM | POA: Diagnosis not present

## 2014-12-28 DIAGNOSIS — F329 Major depressive disorder, single episode, unspecified: Secondary | ICD-10-CM | POA: Diagnosis not present

## 2014-12-29 DIAGNOSIS — M159 Polyosteoarthritis, unspecified: Secondary | ICD-10-CM | POA: Diagnosis not present

## 2014-12-29 DIAGNOSIS — F17210 Nicotine dependence, cigarettes, uncomplicated: Secondary | ICD-10-CM | POA: Diagnosis not present

## 2014-12-29 DIAGNOSIS — G629 Polyneuropathy, unspecified: Secondary | ICD-10-CM | POA: Diagnosis not present

## 2014-12-29 DIAGNOSIS — Z8673 Personal history of transient ischemic attack (TIA), and cerebral infarction without residual deficits: Secondary | ICD-10-CM | POA: Diagnosis not present

## 2014-12-29 DIAGNOSIS — R2689 Other abnormalities of gait and mobility: Secondary | ICD-10-CM | POA: Diagnosis not present

## 2014-12-29 DIAGNOSIS — M8580 Other specified disorders of bone density and structure, unspecified site: Secondary | ICD-10-CM | POA: Diagnosis not present

## 2014-12-29 DIAGNOSIS — J449 Chronic obstructive pulmonary disease, unspecified: Secondary | ICD-10-CM | POA: Diagnosis not present

## 2014-12-29 DIAGNOSIS — E119 Type 2 diabetes mellitus without complications: Secondary | ICD-10-CM | POA: Diagnosis not present

## 2014-12-29 DIAGNOSIS — R296 Repeated falls: Secondary | ICD-10-CM | POA: Diagnosis not present

## 2014-12-29 DIAGNOSIS — F329 Major depressive disorder, single episode, unspecified: Secondary | ICD-10-CM | POA: Diagnosis not present

## 2014-12-31 DIAGNOSIS — M159 Polyosteoarthritis, unspecified: Secondary | ICD-10-CM | POA: Diagnosis not present

## 2014-12-31 DIAGNOSIS — E119 Type 2 diabetes mellitus without complications: Secondary | ICD-10-CM | POA: Diagnosis not present

## 2014-12-31 DIAGNOSIS — G629 Polyneuropathy, unspecified: Secondary | ICD-10-CM | POA: Diagnosis not present

## 2014-12-31 DIAGNOSIS — F329 Major depressive disorder, single episode, unspecified: Secondary | ICD-10-CM | POA: Diagnosis not present

## 2014-12-31 DIAGNOSIS — R2689 Other abnormalities of gait and mobility: Secondary | ICD-10-CM | POA: Diagnosis not present

## 2014-12-31 DIAGNOSIS — R296 Repeated falls: Secondary | ICD-10-CM | POA: Diagnosis not present

## 2015-01-04 DIAGNOSIS — E119 Type 2 diabetes mellitus without complications: Secondary | ICD-10-CM | POA: Diagnosis not present

## 2015-01-04 DIAGNOSIS — R296 Repeated falls: Secondary | ICD-10-CM | POA: Diagnosis not present

## 2015-01-04 DIAGNOSIS — M159 Polyosteoarthritis, unspecified: Secondary | ICD-10-CM | POA: Diagnosis not present

## 2015-01-04 DIAGNOSIS — R2689 Other abnormalities of gait and mobility: Secondary | ICD-10-CM | POA: Diagnosis not present

## 2015-01-04 DIAGNOSIS — F329 Major depressive disorder, single episode, unspecified: Secondary | ICD-10-CM | POA: Diagnosis not present

## 2015-01-04 DIAGNOSIS — G629 Polyneuropathy, unspecified: Secondary | ICD-10-CM | POA: Diagnosis not present

## 2015-01-09 NOTE — H&P (Signed)
PATIENT NAME:  Kendra [name], Kendra [name] A MR#:  [id number] DATE OF BIRTH:  09-17-1946  DATE OF ADMISSION:  11/30/2013  REFERRING PHYSICIAN: Dr. Jacqualine Code.   PRIMARY CARE PHYSICIAN: Dr. Ancil Boozer.   CHIEF COMPLAINT: Shortness of breath.    A 69 year old [place] American female with past medical history of COPD, non oxygen dependent, hypertension, diabetes presented with shortness of breath. Describes two day duration of shortness of breath with associated nonproductive cough. Positive for subjective fevers, chills which has been worsening over the last day. She denies any further symptoms. She denies any nausea, vomiting, diarrhea, abdominal pain. Denies any recent sick contacts. She was noted to be hypoxic, in the Emergency Department requiring supplemental O2  She has had some relief after receiving 2 DuoNeb treatments. Currently without complaints.   REVIEW OF SYSTEMS:  CONSTITUTIONAL: Positive for fevers, chills, fatigue.  EYES: Denies blurred vision, double vision, eye pain.  EARS, NOSE, THROAT: Denies tinnitus, ear pain, hearing loss.  RESPIRATORY: Positive for cough, shortness of breath. Denies wheezing.  CARDIOVASCULAR: Denies chest pain, palpitations, edema.  GASTROINTESTINAL: Denies nausea, vomiting, diarrhea, abdominal pain.  GENITOURINARY: Denies dysuria, hematuria.  ENDOCRINE: Denies nocturia or thyroid problems. HEMATOLOGIC/LYMPHATIC: Denies easy bruising, bleeding.  SKIN: Denies rash or lesion.  MUSCULOSKELETAL: Denies pain in neck, back, shoulders, knee, hips or arthritic symptoms.  NEUROLOGICAL: Denies paralysis, paresthesias.  PSYCHIATRIC: Denies anxiety/depression symptoms.  Otherwise, full review of systems performed by me is negative.   PAST MEDICAL HISTORY: Hypertension, diabetes, gastric reflux disease, COPD, non- O2 dependent.   SOCIAL HISTORY: Positive for tobacco usage. Denies any alcohol usage or drug usage.   FAMILY HISTORY: Denies any known cardiovascular or pulmonary  disorders.   ALLERGIES: No known drug allergies.   HOME MEDICATIONS: Include tramadol 50 mg p.o. 3 times daily as needed for pain, citalopram 20 mg p.o. daily, Janumet 1000 mg/50 mg 1 tab p.o. b.i.d., Lantus 30 units daily, NovoLog 8 units daily, Ambien 5 mg p.o. at bedtime as needed for sleep, Advair 250/50 mcg 1 puff b.i.d., Pro air 90 mcg inhalation every 4 to 6 hours as needed for shortness of breath, Spiriva 18 mcg inhalation daily, Nexium 40 mg p.o. daily, vitamin B12 1000 mcg p.o. daily,   PHYSICAL EXAMINATION: VITAL SIGNS: Temperature 101.9, heart rate 81, respirations 20, blood pressure 120/86, saturating 94% on supplemental O2. Weight 75.8 kg, BMI 28.7.  GENERAL: Well-nourished, well-developed, African American female, currently in no acute distress.  HEAD: Normocephalic, atraumatic.  EYES: Pupils equal, round, reactive to light. Extraocular muscles intact. No scleral icterus.  MOUTH: Moist oral mucosa, dentition intact. No abscess noted.  EAR, NOSE AND THROAT: Throat clear without exudates. No external lesions.  NECK: Supple. No thyromegaly. No nodules. No JVD.  PULMONARY: Clear to auscultation bilaterally without wheezes, rales, rhonchi. No use of accessory muscles. Good respiratory effort.  CHEST: Nontender to palpation.  CARDIOVASCULAR: S1, S2, tachycardic. No murmurs, rubs, or gallops. No edema. Pedal pulses 2+ bilaterally.  GASTROINTESTINAL: Soft, nontender, nondistended. No masses. Positive bowel sounds. No hepatosplenomegaly.  MUSCULOSKELETAL: No swelling, clubbing, edema. Range of motion full in all extremities.  NEUROLOGIC: Cranial nerves II through XII intact. No gross focal neurological deficits, sensation intact,  reflexes intact.  SKIN: No ulcerations, lesions, rash, cyanosis. Skin warm, dry. Turgor intact.  PSYCHIATRIC: Mood and affect within normal limits. The patient alert and oriented x 3. Insight and judgment intact.   LABORATORY DATA: Chest x-ray performed  revealing no acute cardiopulmonary process. Remainder of laboratory data: Sodium 136, potassium 3.6,  chloride 102, bicarbonate 27, BUN 5, creatinine 0.76, glucose 182, troponin I less than 0.02. WBC 6, hemoglobin 12.9, platelets of 250. Influenza negative.   ASSESSMENT AND PLAN: A 69 year old African American female with history of chronic obstructive pulmonary disease presenting with shortness of breath. 1. Chronic obstructive pulmonary disease exacerbation.  Provide DuoNeb therapy q.4h., supplemental O2 to keep oxygen greater than 92%. Flutter valve therapy, azithromycin, steroids with Solu-Medrol 60 mg IV daily. This seems that it is likely induced by a viral upper respiratory infection given her symptomatology . Continue her Advair and Spiriva. 2. Diabetes type 2. Continue Lantus, hold p.o. agents, add insulin sliding scale, q.6 hour Accu-Cheks.  3. Gastroesophageal reflux disease with proton pump inhibitor therapy. 4.  vte  prophylaxis with heparin  CODE STATUS: FULL CODE.   TIME SPENT: 45 minutes.    ____________________________ Aaron Mose. Drake Wuertz, MD dkh:sg D: 12/01/2013 00:11:00 ET T: 12/01/2013 11:58:49 ET JOB#: [id number]  cc: Aaron Mose. Maleki Hippe, MD, <Dictator> Jennalynn Rivard Woodfin Ganja MD ELECTRONICALLY SIGNED 12/01/2013 20:53

## 2015-01-09 NOTE — H&P (Signed)
PATIENT NAME:  Kendra Kendra [name], Kendra Kendra [name] MR#:  [id number] DATE OF BIRTH:  [birth year]  DATE OF ADMISSION:  09/02/2014  REFERRING PHYSICIAN: Verdia Kuba. Paduchowski, MD   PRIMARY CARE PHYSICIAN: Bethena Roys. Sowles, MD   CHIEF COMPLAINT: Weakness.   HISTORY OF PRESENT ILLNESS: Kendra [name] 69 year old African American female with history of type 2 diabetes insulin-requiring, however uncomplicated; hypertension essential, presenting with slurred speech and weakness. She describes acute onset of symptoms which were slurred speech and Kendra [name] left-sided weakness about 2 hours prior to arrival to the Emergency Department. This was noted by her daughter, who is on the phone with her. Weakness is mainly in her left lower extremity. Upon arrival to the Emergency Department, symptoms already started to improve, speech back to normal per family at bedside; however, the weakness is still present but continues improvement. Denies any further symptomatology at this time.   REVIEW OF SYSTEMS:  CONSTITUTIONAL: Denies fever, fatigue. Positive weakness as described above.  EYES: Denies blurry vision, double vision, eye pain.  EAR, NOSE, THROAT: Denies tinnitus, ear pain, hearing loss.  RESPIRATORY: Denies cough or shortness of breath. CARDIOVASCULAR: Denies chest pain, palpitations, edema. GASTROINTESTINAL: Denies nausea, vomiting, diarrhea, or abdominal pain.  GENITOURINARY; Denies dysuria or hematuria. ENDOCRINE: Denies nocturia or thyroid problems. HEMATOLOGIC AND LYMPHATIC: Denies easy bleeding or bruising.   SKIN: No rashes or lesions.  MUSCULOSKELETAL: Denies pain in neck, back, shoulder, knees, hips or arthritic symptoms.  NEUROLOGIC: Positive for dysarthria as well as left-sided weakness as mentioned above. PSYCHIATRIC: Denies anxiety or depressive symptoms. Otherwise Kendra [name] full review of systems performed by me is negative.   PAST MEDICAL HISTORY: Hypertension, essential; type 2 diabetes, insulin-requiring, however, uncomplicated;  gastroesophageal reflux disease ; depression not otherwise specified; COPD, non-oxygen dependent.   SOCIAL HISTORY: Positive for continued tobacco use. Denies alcohol or drug use.  FAMILY HISTORY: She denies any known cardiovascular or pulmonary disorders.   ALLERGIES: No known drug allergies.   HOME MEDICATIONS: Include Percocet 5/325 mg 1 tablet  p.o. q.6 hours as needed for pain, tramadol 50 mg 1 tablet p.o. b.i.d. as needed for pain, citalopram 20 mg p.o. daily, januvmet 1000/50 mg p.o. b.i.d.,  Lantus 30 units subcutaneous daily, NovoLog 8 units in the morning for sliding coverage, Ambien 5 mg p.o. at bedtime as needed for inability to sleep, Advair 250/50 mcg inhalation 1 puff b.i.d., ProAir 90 mcg inhalation 2 puffs 4 times Kendra [name] day as needed for shortness of breath, Spiriva 18 mcg inhalation once daily, Nexium 40 mg p.o. daily, vitamin B12 at 1000 mcg p.o. daily.   PHYSICAL EXAMINATION:  VITAL SIGNS: Temperature 98, heart rate 86, respirations 17, blood pressure 127/87, saturating 100% on room air. Weight  72.2 kg, BMI of 27.2.  GENERAL: Weak -appearing African American female currently in no acute distress.  HEAD: Normocephalic, atraumatic.  EYES: Pupils equal, round reactive to light. Extraocular muscles intact. No scleral icterus.  MOUTH: Moist oral mucosa. Dentition intact. No abscess noted.  EARS, NOSE, THROAT: Clear without exudates. No external lesions. NECK: Supple. No thyromegaly. No nodules. No JVD.   PULMONARY: Clear to auscultation bilaterally without wheeze, rales, or rhonchi. No accessory muscle use. Good respiratory effort. CHEST: Nontender to palpation.  CARDIOVASCULAR: S1, S2, regular rate and rhythm. No murmurs, rubs, or gallops. No edema. Pedal pulses 2+ bilaterally.   GASTROINTESTINAL: Soft, nontender, nondistended. No masses. Positive bowel sounds. No hepatosplenomegaly.  MUSCULOSKELETAL: No swelling, clubbing, or edema. Range of motion full in all extremities.   NEUROLOGIC: Cranial nerve  nerves II-XII within normal limits aside from left-sided facial droop. Also strength testing right upper extremity and right lower extremity 5/5 including proximal and distal flexion and extension; however, her left upper extremity hand grip 4/5, proximal and distal flexion-extension 4-/5, lower extremity distally 3/5, distal flexion and extension 3/5, proximal flexion and extension 4/5. Left-sided pronator drift also present. Sensation intact. Reflexes intact.  PSYCHIATRIC: Mood and affect within normal limits. Patient awake, alert, oriented x 3. Insight and judgment intact.   LABORATORY DATA: Sodium 138, potassium 3.5, bicarbonate 28, BUN 14, creatinine 0.9, glucose 270. LFTs within normal limits. WBC 6.9, hemoglobin 12.7, platelets of 270,000. CT head performed no acute intracranial process.   ASSESSMENT AND PLAN: Kendra [name] 70 year old African American type 2 diabetes insulin-requiring, hypertension essential, presenting with slurred speech and weakness. Still has symptoms present including left-sided weakness as well as pronator drift; however, symptoms have markedly improved since onset. 1.  Transient ischemic attack: Symptoms improving; however, once again, still with left-sided weakness present with pronator drift. Neurologic checks q.4 hours, MRI of the brain, transthoracic echocardiogram, and carotid Doppler. Initiate aspirin and statin therapy. permissive hypertension, treating blood pressure greater than 220/120 or symptomatic. We will hold heparin for the first 24-48 hours.  2  Type 2 diabetes, insulin-requiring, however, uncomplicated: Continue Lantus. Add insulin sliding scale with q.6 hours Accu-Checks.   3.  Chronic obstructive pulmonary disease, not in exacerbation: Continue with Advair and p.r.n. breathing treatments as required.  4.  Gastrointestinal prophylaxis: Proton pump inhibitor therapy.  5.  Venous thromboembolism prophylaxis: Sequential compression  devices.  CODE STATUS: Patient is full code.  TIME SPENT: 45 minutes.   ____________________________ Aaron Mose. Colston Pyle, MD dkh:bm D: 09/01/2014 23:56:00 ET T: 09/02/2014 01:10:55 ET JOB#: [id number]  cc: Aaron Mose. Diego Ulbricht, MD, <Dictator> Bristol Osentoski Woodfin Ganja MD ELECTRONICALLY SIGNED 09/02/2014 2:57

## 2015-01-09 NOTE — Discharge Summary (Signed)
PATIENT NAME:  Kendra [name], Kendra [name] MR#:  [id number] DATE OF BIRTH:  12-Apr-1946  DATE OF ADMISSION:  09/02/2014 DATE OF DISCHARGE:  09/05/2014   For [name] detailed note, please look at the history and physical done on admission by Dr. Valentino Nose.  Please also look at the detailed discharge summary done by Dr. Ether Griffins, which covers the extensive part of the hospital course from 09/01/2014 through 09/04/2014. This is just [name] quick addendum for 09/05/2014.   DIAGNOSES AT DISCHARGE:  1.  Acute cerebrovascular accident in the right MCA territory resulting in left-sided weakness.  2.  Slurred speech and dysphagia secondary to [name] stroke. 3.  Hyperlipidemia. 4.  Diabetes.  5.  Hypertension. 6.  Chronic obstructive pulmonary disease with ongoing tobacco abuse. 7.  Gastroesophageal reflux disease. 8.  Depression.   DIET: The patient is being discharged on [name] low-sodium, low-fat, American Diabetic Association diet with strict aspiration precautions, and [name] mechanical soft diet.   ACTIVITY: As tolerated. The patient will continue physical therapy, occupational therapy and speech at the rehab facility.   DISCHARGE MEDICATIONS: As follows: Janumet 1000 mg-50 at 1 tablet b.i.d., Spiriva  1 puff daily, Advair 250/50 at 1 puff b.i.d., Celexa 20 mg [name] day, Nexium 40 mg daily, NovoLog FlexPen 8 units in the morning, Ambien 5 mg at bedtime as needed, Percocet 5/325 at 1 tablet every 6 hours as needed, Lantus 35 units at bedtime, atorvastatin 20 mg daily, aspirin 325 mg daily, Colace 100 mg b.i.d., Senokot 1 tablet daily, Combivent Respimat  1 puff 4 times daily as needed, Nicotrol inhaler every 1 to 2 hours as needed, Glucerna shake b.i.d. as needed, and lisinopril 5 mg daily.   BRIEF HOSPITAL COURSE:  This is [name] 68 year old female with medical problems as mentioned above, presented to the hospital due to left-sided weakness and slurred speech and [name] left-sided facial droop consistent with an acute CVA.  1.  Acute  cerebrovascular accident. The patient had left-sided weakness and had [name] MRI that has confirmed [name] right MCA territory stroke. The patient was seen by physical therapy and occupational therapy and is currently being discharged to short-term rehab. She also was seen by speech, is currently tolerating [name] mechanical soft diet with strict aspiration precautions.  She will continue aspirin. She will continue statin and continue rehab as mentioned.  2.  Diabetes type 2.  This is uncontrolled. Hemoglobin A1c was as high as 9.5.  For now, the patient will continue her Lantus, Janumet and NovoLog in the morning and further titrations to diabetic medications can be done as an outpatient.  3.  Hypertension. The patient apparently was not on any antihypertensives prior to coming to the hospital.  Her blood pressures were somewhat low initially, but has started to creep up [name] bit. I did start her on low-dose lisinopril prior to discharge. 4.   Chronic obstructive pulmonary disease. The patient had no acute chronic obstructive pulmonary disease exacerbation. She will continue on Advair, Spiriva and Combivent as stated.  5.  Nicotine dependence. The patient was placed on nicotine patch. She will continue on Nicotrol inhaler as needed.  6.  Constipation. This is probably secondary to use of chronic pain medications. The patient has not had [name] bowel movement in 2 days.  She will be given lactulose. She has been given [name] suppository yesterday. We will follow response, likely she will be able to have [name] bowel movement and be discharged later today.   CODE STATUS:  The patient is  [name] FULL CODE.   DISPOSITION: She is being discharged to [name] skilled nursing facility for ongoing care.   TIME SPENT: 40 minutes.     ____________________________ Belia Heman. Verdell Carmine, MD vjs:DT D: 09/05/2014 09:02:33 ET T: 09/05/2014 09:18:07 ET JOB#: [id number]  cc: Belia Heman. Verdell Carmine, MD, <Dictator> Henreitta Leber MD ELECTRONICALLY SIGNED 09/17/2014  10:44

## 2015-01-09 NOTE — Discharge Summary (Signed)
PATIENT NAME:  Kendra [name], BLOOMQUIST A MR#:  [id number] DATE OF BIRTH:  April 11, 1946  DATE OF ADMISSION:  11/30/2013 DATE OF DISCHARGE:  12/01/2013  DISCHARGE DIAGNOSES:  1. Chronic obstructive pulmonary disease exacerbation.  2. Acute bronchitis.  3. Sepsis.  4. Insulin-dependent diabetes mellitus.  5. Tobacco abuse.   DISCHARGE MEDICATIONS:  1. Spiriva 18 mcg inhaled daily.  2. Janumet 1 tablet oral 2 times a day.  3. Citalopram 20 mg daily.  5. Tramadol 50 mg oral 2 times a day as needed for pain.  6. Ambien 5 mg oral once a day at bedtime as needed.  7. Lantus 30 units subcutaneous once a day at bedtime.  8. NovoLog FlexPen 8 units subcutaneous once a day.  9. Advair Diskus 250/50 one puff inhaled 2 times a day.  10. Vitamin B12 1000 mcg daily.  11. Nexium 40 mg daily.  12. ProAir HFA 2 puffs every 4 hours as needed for shortness of breath or wheezing.  13. Prednisone 60 mg tapered over 6 days.  14. Erythromycin 500 mg oral once a day.  15. Cheracol with codeine 10 mL oral 3 times a day as needed for cough.   ADMITTING HISTORY AND PHYSICAL AND HOSPITAL COURSE: Please see detailed H and P dictated previously by Dr. Lavetta Nielsen. In brief, a 66 patient with history of diabetes, COPD with tobacco abuse. Presented to the hospital complaining of shortness of breath. The patient was found to have fever along with tachycardia, wheezing. Admitted to the hospitalist service.   HOSPITAL COURSE: Acute COPD exacerbation with acute bronchitis and sepsis. The patient was started on IV steroids, nebulizers, antibiotics, with which she improved well. Dramatic improvement, and the patient had requested to be discharged home. She is saturating 96% on room air on ambulation with minimal wheezing. She has been counseled for greater than 3 minutes by me on day of discharge to quit smoking. She has verbalized understanding. I will start her on azithromycin tablets, prednisone taper and discharge the patient back to  follow up with her primary care physician. The patient did have sepsis during the admission which has resolved.   DISCHARGE INSTRUCTIONS: Carbohydrate-controlled diet. Activity as tolerated. Follow up with primary care physician in 1 or 2 weeks and quit smoking.   ____________________________ Leia Alf. Sharelle Burditt, MD srs:gb D: 12/01/2013 15:01:39 ET T: 12/02/2013 00:52:27 ET JOB#: [id number]  cc: Alveta Heimlich R. Peirce Deveney, MD, <Dictator> Bethena Roys. Ancil Boozer, [place] MD ELECTRONICALLY SIGNED 12/13/2013 10:24

## 2015-01-13 DIAGNOSIS — F329 Major depressive disorder, single episode, unspecified: Secondary | ICD-10-CM | POA: Diagnosis not present

## 2015-01-13 DIAGNOSIS — R2689 Other abnormalities of gait and mobility: Secondary | ICD-10-CM | POA: Diagnosis not present

## 2015-01-13 DIAGNOSIS — R296 Repeated falls: Secondary | ICD-10-CM | POA: Diagnosis not present

## 2015-01-13 DIAGNOSIS — G629 Polyneuropathy, unspecified: Secondary | ICD-10-CM | POA: Diagnosis not present

## 2015-01-13 DIAGNOSIS — M159 Polyosteoarthritis, unspecified: Secondary | ICD-10-CM | POA: Diagnosis not present

## 2015-01-13 DIAGNOSIS — E119 Type 2 diabetes mellitus without complications: Secondary | ICD-10-CM | POA: Diagnosis not present

## 2015-01-13 NOTE — Discharge Summary (Signed)
PATIENT NAME:  Kendra [name], Kendra [name] MR#:  [id number] DATE OF BIRTH:  [birth year]  DATE OF ADMISSION:  09/01/2014 DATE OF DISCHARGE:  09/04/2014  ADMITTING DIAGNOSIS: Transient ischemic attack.   DISCHARGE DIAGNOSES: 1.  Cerebrovascular accident in right MCA territory thrombotic likely with left-sided weakness, slurred speech as well as dysphagia.  2.  Hyperlipidemia with LDL of 73.  3.  Diabetes mellitus type 2, poorly controlled with hemoglobin A1c 9.5.  4.  Essential hypertension.  5.  Chronic obstructive pulmonary disease.  6.  Ongoing tobacco abuse.  7.  Carotid artery disease with less than 70% occlusion per ultrasound. 8.  History of gastroesophageal reflux disease as well as depression.   DISCHARGE CONDITION: Stable.   DISCHARGE MEDICATIONS: The patient is to continue Spiriva 1 inhalation once daily, Janumet 1000/50 one tablet twice daily, citalopram 20 mg p.o. daily, Advair Diskus 250/50 one puff twice daily, Nexium 40 mg p.o. daily, NovoLog Flex-Pen 8 units subcutaneously in the morning, Ambien 5 mg at bedtime as needed, Percocet 5/325 mg 1 tablet every 6 hours as needed, Lantus 35 units subcutaneously at bedtime, atorvastatin 20 mg p.o. daily, aspirin 325 mg p.o. daily, docusate sodium 100 mg twice daily, senna 1 tablet once daily as needed, Combivent Respimat 1 inhalation 4 times daily as needed, nicotine oral inhaler 1 inhalation every 1 to 2 hours as needed, Glucerna shakes 237 mL twice daily as needed.   NOTE: The patient is not to take tramadol, ProAir or prednisone unless recommended by primary physician.   HOME OXYGEN: None.   DISCHARGE DIET: Two grams salt, low-fat, low-cholesterol, carbohydrate-controlled diet. Mechanical soft. No stools. Aspiration precautions. Recommend quiet environment with subdued distractions during meals. Medications in puree.   DIET SPECIAL INSTRUCTIONS: Moisten foods well and cut meats small, smaller bites and sips, eat and drink slowly.  ACTIVITY  LIMITATIONS: As tolerated.   REFERRALS: To physical therapy, speech therapy as well as occupational therapy.   FOLLOW-UP APPOINTMENTS: With Dr. Ancil Boozer in 2 days after discharge.  CONSULTANTS: Care management, social work, physical therapy, speech therapy.   RADIOLOGIC STUDIES: CT scan of head without contrast, 15th of December 2015, revealing no acute intracranial findings, moderate chronic small vessel disease.   Chest x-ray, PA and lateral, 16th of December 2015, showed no active cardiopulmonary disease.   MRI of brain without contrast, 16th of December 2015, revealed small patchy right MCA territory acute infarct, moderate chronic small vessel ischemic disease.   Carotid ultrasound, 16th of December 2015, revealed moderate plaque at both carotid bifurcations and in both proximal internal carotid arteries. Estimated right ICA stenosis of 50 to 69%. Estimated left ICA stenosis is less than 50%.  Echocardiogram, 16th of December 2015, revealing left ventricular ejection fraction by visual estimation 55% to 60%, normal global left ventricular systolic function, moderately increased left ventricular posterior wall thickness.   HISTORY AND HOSPITAL COURSE: The patient is [name] 69 year old African American female with [name] history of diabetes, who is insulin dependent, presented to the hospital with complaints of weakness on the left side of her body which is going on for approximately 2 weeks now as well as slurring speech. Please refer to Dr. Samantha Crimes admission note on the 15th of December 2015.   On arrival to the Emergency Room, the patient's temperature was 98, pulse was 86, respiration rate was 17, blood pressure 127/87 and saturation was 100% on room air. Physical exam revealed left-sided weakness, also left-sided facial droop. Sensation was intact as well as reflexes.  The  patient's lab data done on arrival to the hospital: BMP: Glucose level of 287, otherwise unremarkable. Lipase level was low at  69. Liver enzymes were unremarkable. Cardiac enzymes, one set, was unremarkable. CBC within normal limits with white blood cell count 6.9, hemoglobin 12.7, and platelet count was 270,000. Urinalysis was remarkable for 4 white blood cells, 1 red blood cell, negative for protein, nitrites or leukocyte esterase, no bacteria or epithelial cells were noted. EKG showed normal sinus rhythm at 81 beats per minute, left axis deviation, moderate voltage criteria for LVH, and questionable septal infarct, age undetermined.   The patient was admitted to the hospital for further evaluation with diagnosis of TIA and TIA workup was entertained. The patient had carotid ultrasound done which revealed carotid artery stenosis as well as carotid bifurcation stenosis as well as proximal internal carotid artery stenosis, on the right less than 70% and on the left less than 50%. The patient would benefit from vascular surgery evaluation as outpatient. The patient's lipid panel was checked while she was in the hospital and her lipid panel revealed LDL of 73, cholesterol level 141, triglycerides 155, and HDL was 37. The patient was initiated on Lipitor. It is recommended to have the patient's LDL less than 73. The patient is to continue aspirin therapy for stroke, which was detected on MRI as well as clinically since the patient's condition did not improve as time progressed. She was seen by speech therapist who recommended aspiration precautions.   In regards to diabetes mellitus, the patient's hemoglobin A1c was found to be 9.3. Since the patient did not have [name] good appetite in the hospital, we were not able to advance her Lantus significantly. However, we were able to advance it to 35 units subcutaneously twice daily dose. We recommend however to advance the patient's Lantus depending on her oral intake.  In regards to essential hypertension, the patient's blood pressure medications were placed on hold while she was in the hospital  since her blood pressure was relatively low. We recommended to continue holding any blood pressure medications since her blood pressure on the day of discharge is around 370 systolic to 488Q. We recommend however to continue low salt diet.   In regards to CKD, as well as ongoing tobacco abuse, we counseled the patient about tobacco abuse risks and recommended nicotine replacement therapy. The patient is also to continue her usual inhalation therapy for COPD, which was stable.  For carotid artery disease, the patient is to follow up with vascular surgery.  For her chronic medical problems, including depression as well as gastroesophageal reflux disease, the patient is to continue her outpatient management. She was evaluated by physical therapist and rehabilitation was recommended. The patient is being referred to physical therapy, speech therapy as well as occupational therapy in rehabilitation facility. She is to follow up with her primary care physician in the next few days after discharge.  On the day of discharge, the patient's temperature was 98.1, pulse was 60, respiration was 20, blood pressure 138/72 and saturation was 97 to 98% on room air at rest.   TIME SPENT: 40 minutes.  ____________________________ Theodoro Grist, MD rv:sb D: 09/04/2014 12:28:26 ET T: 09/04/2014 13:43:17 ET JOB#: [id number]  cc: Theodoro Grist, MD, <Dictator> Bethena Roys. Ancil Boozer, MD Theodoro Grist MD ELECTRONICALLY SIGNED 09/21/2014 21:24

## 2015-01-18 DIAGNOSIS — G629 Polyneuropathy, unspecified: Secondary | ICD-10-CM | POA: Diagnosis not present

## 2015-01-18 DIAGNOSIS — E119 Type 2 diabetes mellitus without complications: Secondary | ICD-10-CM | POA: Diagnosis not present

## 2015-01-18 DIAGNOSIS — R2689 Other abnormalities of gait and mobility: Secondary | ICD-10-CM | POA: Diagnosis not present

## 2015-01-18 DIAGNOSIS — R296 Repeated falls: Secondary | ICD-10-CM | POA: Diagnosis not present

## 2015-01-18 DIAGNOSIS — M159 Polyosteoarthritis, unspecified: Secondary | ICD-10-CM | POA: Diagnosis not present

## 2015-01-18 DIAGNOSIS — F329 Major depressive disorder, single episode, unspecified: Secondary | ICD-10-CM | POA: Diagnosis not present

## 2015-03-03 ENCOUNTER — Ambulatory Visit: Payer: Self-pay | Admitting: Family Medicine

## 2015-03-16 ENCOUNTER — Other Ambulatory Visit: Payer: Self-pay | Admitting: Family Medicine

## 2015-03-30 ENCOUNTER — Telehealth: Payer: Self-pay | Admitting: Family Medicine

## 2015-03-30 ENCOUNTER — Encounter: Payer: Self-pay | Admitting: Family Medicine

## 2015-03-30 DIAGNOSIS — J449 Chronic obstructive pulmonary disease, unspecified: Secondary | ICD-10-CM | POA: Insufficient documentation

## 2015-03-30 DIAGNOSIS — I6930 Unspecified sequelae of cerebral infarction: Secondary | ICD-10-CM | POA: Insufficient documentation

## 2015-03-30 DIAGNOSIS — E663 Overweight: Secondary | ICD-10-CM | POA: Insufficient documentation

## 2015-03-30 DIAGNOSIS — E559 Vitamin D deficiency, unspecified: Secondary | ICD-10-CM | POA: Insufficient documentation

## 2015-03-30 DIAGNOSIS — F32A Depression, unspecified: Secondary | ICD-10-CM | POA: Insufficient documentation

## 2015-03-30 DIAGNOSIS — L602 Onychogryphosis: Secondary | ICD-10-CM | POA: Insufficient documentation

## 2015-03-30 DIAGNOSIS — J432 Centrilobular emphysema: Secondary | ICD-10-CM | POA: Insufficient documentation

## 2015-03-30 DIAGNOSIS — M8580 Other specified disorders of bone density and structure, unspecified site: Secondary | ICD-10-CM | POA: Insufficient documentation

## 2015-03-30 DIAGNOSIS — E1169 Type 2 diabetes mellitus with other specified complication: Secondary | ICD-10-CM | POA: Insufficient documentation

## 2015-03-30 DIAGNOSIS — E785 Hyperlipidemia, unspecified: Secondary | ICD-10-CM | POA: Insufficient documentation

## 2015-03-30 DIAGNOSIS — K219 Gastro-esophageal reflux disease without esophagitis: Secondary | ICD-10-CM | POA: Insufficient documentation

## 2015-03-30 DIAGNOSIS — M5090 Cervical disc disorder, unspecified, unspecified cervical region: Secondary | ICD-10-CM | POA: Insufficient documentation

## 2015-03-30 DIAGNOSIS — J309 Allergic rhinitis, unspecified: Secondary | ICD-10-CM | POA: Insufficient documentation

## 2015-03-30 DIAGNOSIS — H269 Unspecified cataract: Secondary | ICD-10-CM | POA: Insufficient documentation

## 2015-03-30 DIAGNOSIS — E538 Deficiency of other specified B group vitamins: Secondary | ICD-10-CM | POA: Insufficient documentation

## 2015-03-30 DIAGNOSIS — F17200 Nicotine dependence, unspecified, uncomplicated: Secondary | ICD-10-CM | POA: Insufficient documentation

## 2015-03-30 DIAGNOSIS — M1990 Unspecified osteoarthritis, unspecified site: Secondary | ICD-10-CM | POA: Insufficient documentation

## 2015-03-30 DIAGNOSIS — G4700 Insomnia, unspecified: Secondary | ICD-10-CM | POA: Insufficient documentation

## 2015-03-30 DIAGNOSIS — F329 Major depressive disorder, single episode, unspecified: Secondary | ICD-10-CM | POA: Insufficient documentation

## 2015-03-31 NOTE — Telephone Encounter (Signed)
She currently do not have any transportation and will call back once she find someone to bring her

## 2015-04-23 ENCOUNTER — Telehealth: Payer: Self-pay | Admitting: Family Medicine

## 2015-04-23 NOTE — Telephone Encounter (Signed)
Josh from [employer] is requesting a return call [id number]. He received a Prior Authorization for Spiriva but states that patient was on Incruse in the past. Just checking to make sure this is correct.

## 2015-04-23 NOTE — Telephone Encounter (Signed)
I did the PA for Spiriva because it landed on my desk. I didn't check the other medication. I guess ask Dr. Ancil Boozer which one she would prefer her to be on. Incruse must have been what her insurance paid for before.

## 2015-04-23 NOTE — Telephone Encounter (Signed)
Do you know anything regarding this PA? Thanks

## 2015-04-23 NOTE — Telephone Encounter (Signed)
Dr. Ancil Boozer we needed some clarification on what the patient is taking, is she taking Incruse or Spiriva?

## 2015-04-24 ENCOUNTER — Emergency Department
Admission: EM | Admit: 2015-04-24 | Discharge: 2015-04-24 | Discharge status: Against Medical Advice | Payer: Medicare Other | Attending: Student | Admitting: Student

## 2015-04-24 ENCOUNTER — Encounter: Payer: Self-pay | Admitting: Emergency Medicine

## 2015-04-24 ENCOUNTER — Emergency Department: Payer: Medicare Other

## 2015-04-24 ENCOUNTER — Other Ambulatory Visit: Payer: Self-pay

## 2015-04-24 DIAGNOSIS — Z79899 Other long term (current) drug therapy: Secondary | ICD-10-CM | POA: Diagnosis not present

## 2015-04-24 DIAGNOSIS — M6281 Muscle weakness (generalized): Secondary | ICD-10-CM | POA: Diagnosis present

## 2015-04-24 DIAGNOSIS — G459 Transient cerebral ischemic attack, unspecified: Secondary | ICD-10-CM | POA: Insufficient documentation

## 2015-04-24 DIAGNOSIS — R531 Weakness: Secondary | ICD-10-CM | POA: Diagnosis not present

## 2015-04-24 DIAGNOSIS — E1140 Type 2 diabetes mellitus with diabetic neuropathy, unspecified: Secondary | ICD-10-CM | POA: Insufficient documentation

## 2015-04-24 DIAGNOSIS — G629 Polyneuropathy, unspecified: Secondary | ICD-10-CM | POA: Insufficient documentation

## 2015-04-24 DIAGNOSIS — Z794 Long term (current) use of insulin: Secondary | ICD-10-CM | POA: Diagnosis not present

## 2015-04-24 DIAGNOSIS — R262 Difficulty in walking, not elsewhere classified: Secondary | ICD-10-CM | POA: Diagnosis not present

## 2015-04-24 DIAGNOSIS — R29898 Other symptoms and signs involving the musculoskeletal system: Secondary | ICD-10-CM

## 2015-04-24 DIAGNOSIS — Z720 Tobacco use: Secondary | ICD-10-CM | POA: Insufficient documentation

## 2015-04-24 DIAGNOSIS — R51 Headache: Secondary | ICD-10-CM | POA: Diagnosis not present

## 2015-04-24 DIAGNOSIS — R208 Other disturbances of skin sensation: Secondary | ICD-10-CM | POA: Diagnosis not present

## 2015-04-24 LAB — COMPREHENSIVE METABOLIC PANEL
ALT: 15 U/L (ref 14–54)
AST: 16 U/L (ref 15–41)
Albumin: 4.1 g/dL (ref 3.5–5.0)
Alkaline Phosphatase: 69 U/L (ref 38–126)
Anion gap: 7 (ref 5–15)
BUN: 15 mg/dL (ref 6–20)
CO2: 28 mmol/L (ref 22–32)
Calcium: 9.7 mg/dL (ref 8.9–10.3)
Chloride: 105 mmol/L (ref 101–111)
Creatinine, Ser: 0.63 mg/dL (ref 0.44–1.00)
GFR calc Af Amer: >60 mL/min (ref >60)
GFR calc non Af Amer: >60 mL/min (ref >60)
Glucose, Bld: 90 mg/dL (ref 65–99)
Potassium: 3.9 mmol/L (ref 3.5–5.1)
Sodium: 140 mmol/L (ref 135–145)
Total Bilirubin: 0.3 mg/dL (ref 0.3–1.2)
Total Protein: 7.3 g/dL (ref 6.5–8.1)

## 2015-04-24 LAB — URINALYSIS COMPLETE WITH MICROSCOPIC (ARMC ONLY)
Bilirubin Urine: NEGATIVE
Glucose, UA: NEGATIVE mg/dL
Hgb urine dipstick: NEGATIVE
Ketones, ur: NEGATIVE mg/dL
Leukocytes, UA: NEGATIVE
Nitrite: NEGATIVE
Protein, ur: NEGATIVE mg/dL
Specific Gravity, Urine: 1.011 (ref 1.005–1.030)
pH: 5 (ref 5.0–8.0)

## 2015-04-24 LAB — CBC WITH DIFFERENTIAL/PLATELET
Basophils Absolute: 0.1 10*3/uL (ref 0–0.1)
Basophils Relative: 1 %
Eosinophils Absolute: 0.2 10*3/uL (ref 0–0.7)
Eosinophils Relative: 3 %
HCT: 36.5 % (ref 35.0–47.0)
Hemoglobin: 12.5 g/dL (ref 12.0–16.0)
Lymphocytes Relative: 46 %
Lymphs Abs: 3.3 10*3/uL (ref 1.0–3.6)
MCH: 31.3 pg (ref 26.0–34.0)
MCHC: 34.4 g/dL (ref 32.0–36.0)
MCV: 91 fL (ref 80.0–100.0)
Monocytes Absolute: 0.3 10*3/uL (ref 0.2–0.9)
Monocytes Relative: 5 %
Neutro Abs: 3.3 10*3/uL (ref 1.4–6.5)
Neutrophils Relative %: 45 %
Platelets: 242 10*3/uL (ref 150–440)
RBC: 4.01 MIL/uL (ref 3.80–5.20)
RDW: 13.3 % (ref 11.5–14.5)
WBC: 7.2 10*3/uL (ref 3.6–11.0)

## 2015-04-24 LAB — TROPONIN I: Troponin I: <0.03 ng/mL (ref <0.031)

## 2015-04-24 LAB — PROTIME-INR
INR: 1.27
Prothrombin Time: 16.1 seconds — ABNORMAL HIGH (ref 11.4–15.0)

## 2015-04-24 LAB — APTT: aPTT: 31 seconds (ref 24–36)

## 2015-04-24 NOTE — ED Notes (Signed)
PT to ER via EMS from home with c/o hand tingling, more right than left and right leg weakness.  Pt with previous hx of CVA in December minimal left sided deficits remaining

## 2015-04-24 NOTE — ED Provider Notes (Signed)
[facility] Emergency Department Provider Note  ____________________________________________  Time seen: Approximately 8:30 PM  I have reviewed the triage vital signs and the nursing notes.   HISTORY  Chief Complaint Numbness and Extremity Weakness    HPI Kendra [name] is a 69 y.o. female with COPD, diabetes, hypo-lipidemia, recent CVA with minimal left-sided deficits to presents for evaluation of sudden onset right lower extremity weakness, now resolved. Patient reports that approximately 5:30 p.m. she developed weakness in the right leg and reports that she "couldn't make it move". Her son had to help her/catch her before she could fall on the ground. She has had tingling in bilateral hands for some time however reports that that is not new this evening. No recent illness including no cough, sneezing, runny nose, congestion, vomiting, diarrhea. No chest pain or difficulty breathing. Current severity of symptoms is mild. No modifying factors.   Past Medical History  Diagnosis Date  . Back pain   . Chest pain   . Insomnia   . Osteoarthritis   . Neuropathy   . Cataract   . Osteopenia   . Reflux   . COPD (chronic obstructive pulmonary disease)   . Tenosynovitis   . Diabetic neuropathy   . Hyperlipidemia   . Cervical disc disorder   . Diabetes     Patient Active Problem List   Diagnosis Date Noted  . Cataract 03/30/2015  . Other and unspecified disc disorder of cervical region 03/30/2015  . Insomnia 03/30/2015  . COPD, moderate 03/30/2015  . Clinical depression 03/30/2015  . Dyslipidemia 03/30/2015  . Gastro-esophageal reflux disease without esophagitis 03/30/2015  . Increased thickness of nail 03/30/2015  . History of cerebrovascular accident with residual deficit 03/30/2015  . Neuropathy 03/30/2015  . Osteoarthritis 03/30/2015  . Osteopenia 03/30/2015  . Excess weight 03/30/2015  . Allergic rhinitis 03/30/2015  . B12 deficiency 03/30/2015   . Diabetes mellitus, type 2 03/30/2015  . Tobacco use disorder 03/30/2015  . Vitamin D deficiency 03/30/2015    Past Surgical History  Procedure Laterality Date  . Breast biopsy    . Neck surgery    . Ovarian cyst removal      Current Outpatient Rx  Name  Route  Sig  Dispense  Refill  . albuterol-ipratropium (COMBIVENT) 18-103 MCG/ACT inhaler   Inhalation   Inhale 1 puff into the lungs every 6 (six) hours as needed for wheezing or shortness of breath.         [name] atorvastatin (LIPITOR) 40 MG tablet   Oral   Take 40 mg by mouth daily.         . insulin aspart (NOVOLOG) 100 UNIT/ML injection   Subcutaneous   Inject 8 Units into the skin daily. Before largest meal of the day         . insulin glargine (LANTUS) 100 UNIT/ML injection   Subcutaneous   Inject 30 Units into the skin at bedtime.          . pravastatin (PRAVACHOL) 20 MG tablet   Oral   Take 20 mg by mouth daily.         [name] PROAIR HFA 108 (90 BASE) MCG/ACT inhaler               . zolpidem (AMBIEN) 5 MG tablet   Oral   Take 5 mg by mouth at bedtime as needed for sleep.         [name] ADVAIR DISKUS 250-50 MCG/DOSE AEPB               .  BD PEN NEEDLE NANO U/F 32G X 4 MM MISC      use WITH LANTUS DAILY AND APIDRA.   100 each   2   . traMADol (ULTRAM) 50 MG tablet      50 mg every 6 (six) hours as needed for moderate pain.            Allergies Review of patient's allergies indicates no known allergies.  History reviewed. No pertinent family history.  Social History History  Substance Use Topics  . Smoking status: Current Some Day Smoker  . Smokeless tobacco: Never Used  . Alcohol Use: No    Review of Systems Constitutional: No fever/chills Eyes: No visual changes. ENT: No sore throat. Cardiovascular: Denies chest pain. Respiratory: Denies shortness of breath. Gastrointestinal: No abdominal pain.  No nausea, no vomiting.  No diarrhea.  No constipation. Genitourinary: Negative  for dysuria. Musculoskeletal: Negative for back pain. Skin: Negative for rash. Neurological: Negative for headaches, + for right lower extremity weakness.  10-point ROS otherwise negative.  ____________________________________________   PHYSICAL EXAM:  VITAL SIGNS: ED Triage Vitals  Enc Vitals Group     BP 04/24/15 2025 150/70 mmHg     Pulse Rate 04/24/15 2025 75     Resp --      Temp 04/24/15 2025 97.9 F (36.6 C)     Temp src --      SpO2 04/24/15 2025 97 %     Weight 04/24/15 2025 140 lb (63.504 kg)     Height 04/24/15 2025 5\' 4"  (1.626 m)     Head Cir --      Peak Flow --      Pain Score 04/24/15 2027 0     Pain Loc --      Pain Edu? --      Excl. in [place]? --     Constitutional: Alert and oriented. Well appearing and in no acute distress. Eyes: Conjunctivae are normal. PERRL. EOMI. Head: Atraumatic. Nose: No congestion/rhinnorhea. Mouth/Throat: Mucous membranes are moist.  Oropharynx non-erythematous. Neck: No stridor.  Cardiovascular: Normal rate, regular rhythm. Grossly normal heart sounds.  Good peripheral circulation. Respiratory: Normal respiratory effort.  No retractions. Lungs CTAB. Gastrointestinal: Soft and nontender. No distention. No abdominal bruits. No CVA tenderness. Genitourinary: deferred Musculoskeletal: No lower extremity tenderness nor edema.  No joint effusions. Neurologic:  Normal speech and language. No gross focal neurologic deficits are appreciated. 5 out of 5 strength in bilateral upper and lower extremities, sensation intact to light touch throughout.  Skin:  Skin is warm, dry and intact. No rash noted. Psychiatric: Mood and affect are normal. Speech and behavior are normal.  ____________________________________________   LABS (all labs ordered are listed, but only abnormal results are displayed)  Labs Reviewed  PROTIME-INR - Abnormal; Notable for the following:    Prothrombin Time 16.1 (*)    All other components within normal limits   URINALYSIS COMPLETEWITH MICROSCOPIC ([facility] ONLY) - Abnormal; Notable for the following:    Color, Urine YELLOW (*)    APPearance CLEAR (*)    Bacteria, UA RARE (*)    Squamous Epithelial / LPF 0-5 (*)    All other components within normal limits  CBC WITH DIFFERENTIAL/PLATELET  COMPREHENSIVE METABOLIC PANEL  TROPONIN I  APTT   ____________________________________________  EKG  ED ECG REPORT I, Joanne Gavel, the attending physician, personally viewed and interpreted this ECG.   Date: 04/24/2015  EKG Time: 20:32  Rate: 71  Rhythm: normal sinus rhythm  Axis: normal  Intervals:none  ST&T Change: Q waves in V1, V2 chronic since 09/01/2014.[name] No acute ST segment elevation.  ____________________________________________  RADIOLOGY  CT head FINDINGS: Chronic right periventricular white matter encephalomalacia. Periventricular and subcortical white matter hypodensity compatible with chronic small vessel ischemic changes. No evidence for acute cortically based infarct, intracranial hemorrhage, mass lesion or mass-effect. Orbits are unremarkable. Paranasal sinuses are unremarkable. Mastoid air cells are well aerated. Calvarium is intact.  IMPRESSION: No acute intracranial process. Chronic small vessel ischemic Change.   ____________________________________________   PROCEDURES  Procedure(s) performed: None  Critical Care performed: Yes, see critical care note(s). Total critical care time spent 30 minutes.  ____________________________________________   INITIAL IMPRESSION / ASSESSMENT AND PLAN / ED COURSE  Pertinent labs & imaging results that were available during my care of the patient were reviewed by me and considered in my medical decision making (see chart for details).  Kendra [name] is a 69 y.o. female with COPD, diabetes, hypo-lipidemia, recent CVA with minimal left-sided deficits to presents for evaluation of sudden onset right lower extremity  weakness, now resolved. Currently she has an intact neurological exam.. Given mild severity of symptoms and rapid resolution of her right lower extremity weakness, will not administer TPA. Plan for labs, CT head, urinalysis, chest x-ray, anticipate admission given concern for TIA.  ----------------------------------------- 10:50 PM on 04/24/2015 ----------------------------------------- Labs reviewed and are generally unremarkable. Urinalysis negative for any evidence of infection. Troponin negative. CT head with no acute intracranial process. I discussed need for admission secondary to my concern for TIA. The patient is refusing admission and has requested to leave [place]. I discussed risks and benefits of leaving [place]. We discussed that risks include repeat TIA or stroke with permanent weakness or numbness of arms or legs. She is able to voice "I know that I could have a permanent stroke and not be able to use my arm or my leg". She has signed [place] paperwork. I encouraged her to return at any time if she changes her mind, has worsening symptoms. She voices understanding of this.  ____________________________________________   FINAL CLINICAL IMPRESSION(S) / ED DIAGNOSES  Final diagnoses:  Right leg weakness  Transient cerebral ischemia, unspecified transient cerebral ischemia type      Joanne Gavel, MD 04/24/15 [phone]

## 2015-04-26 NOTE — Telephone Encounter (Signed)
They are the same class of medication, can take one or the other

## 2015-04-27 NOTE — Telephone Encounter (Signed)
Yes, it was approved

## 2015-04-27 NOTE — Telephone Encounter (Signed)
Was the [place] approved for this patient?

## 2015-05-16 ENCOUNTER — Other Ambulatory Visit: Payer: Self-pay | Admitting: Family Medicine

## 2015-05-21 ENCOUNTER — Ambulatory Visit: Payer: Self-pay | Admitting: Family Medicine

## 2015-05-23 ENCOUNTER — Other Ambulatory Visit: Payer: Self-pay | Admitting: Family Medicine

## 2015-05-25 NOTE — Telephone Encounter (Signed)
Spoke with patient and she said she is having transportation issues and will give [place] a call back to schedule the appointment.

## 2015-05-31 ENCOUNTER — Other Ambulatory Visit: Payer: Self-pay

## 2015-05-31 ENCOUNTER — Telehealth: Payer: Self-pay | Admitting: Family Medicine

## 2015-05-31 NOTE — Telephone Encounter (Signed)
Spoke with patient and she states she is with [employer] now. That her mail order company told her she had to have either a bigger size or smaller size needle they would not cover the 32G now. I informed her to call her Insurance and then call us back with the size so we could send them what is covered.

## 2015-05-31 NOTE — Telephone Encounter (Signed)
Pt needs to know what gage she uses for her needles for her insulin.

## 2015-06-02 NOTE — Telephone Encounter (Signed)
I need u to refuse all so we can close the encounter

## 2015-06-06 ENCOUNTER — Encounter: Payer: Self-pay | Admitting: Family Medicine

## 2015-06-06 DIAGNOSIS — E118 Type 2 diabetes mellitus with unspecified complications: Secondary | ICD-10-CM | POA: Insufficient documentation

## 2015-06-07 ENCOUNTER — Encounter: Payer: Self-pay | Admitting: Family Medicine

## 2015-06-07 ENCOUNTER — Ambulatory Visit
Admission: RE | Admit: 2015-06-07 | Discharge: 2015-06-07 | Disposition: A | Discharge status: Home / Self Care | Payer: Commercial Managed Care - HMO | Source: Ambulatory Visit | Attending: Family Medicine | Admitting: Family Medicine

## 2015-06-07 ENCOUNTER — Ambulatory Visit (INDEPENDENT_AMBULATORY_CARE_PROVIDER_SITE_OTHER): Payer: Commercial Managed Care - HMO | Admitting: Family Medicine

## 2015-06-07 VITALS — BP 116/72 | HR 93 | Temp 98.0°F | Resp 16 | Ht 64.0 in | Wt 137.7 lb

## 2015-06-07 DIAGNOSIS — M158 Other polyosteoarthritis: Secondary | ICD-10-CM | POA: Diagnosis not present

## 2015-06-07 DIAGNOSIS — E559 Vitamin D deficiency, unspecified: Secondary | ICD-10-CM | POA: Diagnosis not present

## 2015-06-07 DIAGNOSIS — M954 Acquired deformity of chest and rib: Secondary | ICD-10-CM | POA: Insufficient documentation

## 2015-06-07 DIAGNOSIS — E785 Hyperlipidemia, unspecified: Secondary | ICD-10-CM | POA: Diagnosis not present

## 2015-06-07 DIAGNOSIS — Z23 Encounter for immunization: Secondary | ICD-10-CM | POA: Diagnosis not present

## 2015-06-07 DIAGNOSIS — E1340 Other specified diabetes mellitus with diabetic neuropathy, unspecified: Secondary | ICD-10-CM

## 2015-06-07 DIAGNOSIS — J449 Chronic obstructive pulmonary disease, unspecified: Secondary | ICD-10-CM | POA: Diagnosis not present

## 2015-06-07 DIAGNOSIS — M6588 Other synovitis and tenosynovitis, other site: Secondary | ICD-10-CM | POA: Diagnosis not present

## 2015-06-07 DIAGNOSIS — R0789 Other chest pain: Secondary | ICD-10-CM | POA: Diagnosis not present

## 2015-06-07 DIAGNOSIS — R634 Abnormal weight loss: Secondary | ICD-10-CM

## 2015-06-07 DIAGNOSIS — I6930 Unspecified sequelae of cerebral infarction: Secondary | ICD-10-CM | POA: Diagnosis not present

## 2015-06-07 DIAGNOSIS — G629 Polyneuropathy, unspecified: Secondary | ICD-10-CM | POA: Diagnosis not present

## 2015-06-07 DIAGNOSIS — E538 Deficiency of other specified B group vitamins: Secondary | ICD-10-CM | POA: Diagnosis not present

## 2015-06-07 DIAGNOSIS — E1342 Other specified diabetes mellitus with diabetic polyneuropathy: Secondary | ICD-10-CM

## 2015-06-07 DIAGNOSIS — G4700 Insomnia, unspecified: Secondary | ICD-10-CM | POA: Diagnosis not present

## 2015-06-07 DIAGNOSIS — E46 Unspecified protein-calorie malnutrition: Secondary | ICD-10-CM | POA: Insufficient documentation

## 2015-06-07 DIAGNOSIS — R6889 Other general symptoms and signs: Secondary | ICD-10-CM | POA: Diagnosis not present

## 2015-06-07 DIAGNOSIS — M659 Synovitis and tenosynovitis, unspecified: Secondary | ICD-10-CM

## 2015-06-07 DIAGNOSIS — R110 Nausea: Secondary | ICD-10-CM

## 2015-06-07 LAB — POCT UA - MICROALBUMIN: Microalbumin Ur, POC: NEGATIVE mg/L

## 2015-06-07 LAB — POCT GLYCOSYLATED HEMOGLOBIN (HGB A1C): Hemoglobin A1C: 5.9

## 2015-06-07 MED ORDER — SITAGLIP PHOS-METFORMIN HCL ER 50-1000 MG PO TB24: 1.0000 | ORAL_TABLET | Freq: Every day | ORAL | Status: DC

## 2015-06-07 MED ORDER — INSULIN GLARGINE 100 UNIT/ML ~~LOC~~ SOLN: 25.0000 [IU] | Freq: Every day | SUBCUTANEOUS | Status: DC

## 2015-06-07 MED ORDER — IPRATROPIUM-ALBUTEROL 20-100 MCG/ACT IN AERS: 1.0000 | INHALATION_SPRAY | Freq: Four times a day (QID) | RESPIRATORY_TRACT | Status: DC | PRN

## 2015-06-07 MED ORDER — ACETAMINOPHEN 500 MG PO TABS: 500.0000 mg | ORAL_TABLET | Freq: Three times a day (TID) | ORAL | Status: DC | PRN

## 2015-06-07 MED ORDER — GLUCOSE BLOOD VI STRP: ORAL_STRIP | Status: DC

## 2015-06-07 MED ORDER — GABAPENTIN 300 MG PO CAPS: 300.0000 mg | ORAL_CAPSULE | Freq: Two times a day (BID) | ORAL | Status: DC

## 2015-06-07 MED ORDER — INSULIN GLARGINE 100 UNIT/ML SOLOSTAR PEN: 25.0000 [IU] | PEN_INJECTOR | Freq: Every day | SUBCUTANEOUS | Status: DC

## 2015-06-07 MED ORDER — INCRUSE ELLIPTA 62.5 MCG/INH IN AEPB
1.0000 | INHALATION_SPRAY | Freq: Every day | RESPIRATORY_TRACT | Status: DC
Start: 2015-06-07 — End: 2016-01-18

## 2015-06-07 MED ORDER — SPIRIVA HANDIHALER 18 MCG IN CAPS: 18.0000 ug | ORAL_CAPSULE | Freq: Two times a day (BID) | RESPIRATORY_TRACT | Status: DC

## 2015-06-07 MED ORDER — ATORVASTATIN CALCIUM 40 MG PO TABS: 40.0000 mg | ORAL_TABLET | Freq: Every day | ORAL | Status: DC

## 2015-06-07 MED ORDER — ZOLPIDEM TARTRATE 5 MG PO TABS: 5.0000 mg | ORAL_TABLET | Freq: Every evening | ORAL | Status: DC | PRN

## 2015-06-07 MED ORDER — ASPIRIN-DIPYRIDAMOLE ER 25-200 MG PO CP12: 1.0000 | ORAL_CAPSULE | Freq: Two times a day (BID) | ORAL | Status: DC

## 2015-06-07 NOTE — Progress Notes (Addendum)
Name: Kendra [name]   MRN: [id number]    DOB: 1946/05/03   Date:06/07/2015       Progress Note  Subjective  Chief Complaint  Chief Complaint  Patient presents with  . Medication Refill    follow-up  . Diabetes    Checks BG bid High-200 Low-123  . Hyperlipidemia  . Insomnia  . COPD  . Arm Pain    right onset 1 week unknown trauma.  Painful and unable to bend or lift arm    HPI  Weight loss: she states she has no appetite, she lives with two sons, she states the smell of food makes her feel nauseated, so she has to wait one hour before she can eat the food she cooks, no nausea or nausea when she is eating, no abdominal pain, no changed in bowel movements, no fever  Insomnia: doing well on Ambien, able to sleep 8 hours with medication, denies side effects of medication  DMII insulin dependent, taking Janumet, Lantus and Novolog before largest meal of the day, her hgbA1C today is below 6 , and because of her age we will adjust dose ( decrease medication intake ) to avoid hypoglycemia. Denies polyphagia, polydipsia or polyuria  Hyperlipidemia: taking Pravastatin, but she has a history of TIA, and we will change to Lipitor  COPD: she is still smoking cigarettes less than one pack daily, using inhalers, she still has a daily cough, no SOB with activity.   Right arm and bilateral tenosynovitis: symptoms getting worse, missed appointment with Ortho because of transportation issues. She states difficulty cooking and cleaning her house,and even opening medication bottles. Right arm is tender above the elbow, pain with extension of elbow and touch, no trauma.   TIA: recent trip to [facility], left against medical advised, went for left side weakness, symptoms resolved, taking Aggrenox, denies side effects, no blood in stools or easy bruising  Chest wall mass: she states she has noticed a lump on left lower anterior rib cage after a fall , years ago. She states it does not hurt, but she would like to  have it checked   Patient Active Problem List   Diagnosis Date Noted  . Weight loss 06/07/2015  . Tenosynovitis of wrist 06/07/2015  . Protein calorie malnutrition 06/07/2015  . Cataract 03/30/2015  . Other and unspecified disc disorder of cervical region 03/30/2015  . Insomnia 03/30/2015  . COPD, moderate 03/30/2015  . Clinical depression 03/30/2015  . Dyslipidemia 03/30/2015  . Gastro-esophageal reflux disease without esophagitis 03/30/2015  . Increased thickness of nail 03/30/2015  . History of cerebrovascular accident with residual deficit 03/30/2015  . Neuropathy 03/30/2015  . Osteoarthritis 03/30/2015  . Osteopenia 03/30/2015  . Allergic rhinitis 03/30/2015  . B12 deficiency 03/30/2015  . Tobacco use disorder 03/30/2015  . Vitamin D deficiency 03/30/2015  . Closed fracture of one or more phalanges of foot 08/12/2014  . Diabetic peripheral neuropathy associated with type 2 diabetes mellitus 08/12/2014    Past Surgical History  Procedure Laterality Date  . Breast biopsy    . Neck surgery    . Ovarian cyst removal      History reviewed. No pertinent family history.  Social History   Social History  . Marital Status: Divorced    Spouse Name: N/A  . Number of Children: N/A  . Years of Education: N/A   Occupational History  . Not on file.   Social History Main Topics  . Smoking status: Current Some Day Smoker  .  Smokeless tobacco: Never Used  . Alcohol Use: No  . Drug Use: No  . Sexual Activity: Not on file   Other Topics Concern  . Not on file   Social History Narrative     Current outpatient prescriptions:  .  acetaminophen (TYLENOL) 500 MG tablet, Take 1 tablet (500 mg total) by mouth every 8 (eight) hours as needed., Disp: 270 tablet, Rfl: 1 .  atorvastatin (LIPITOR) 40 MG tablet, Take 1 tablet (40 mg total) by mouth daily. In place of Pravastatin, Disp: 90 tablet, Rfl: 1 .  BD PEN NEEDLE NANO U/F 32G X 4 MM MISC, use WITH LANTUS DAILY AND APIDRA.,  Disp: 100 each, Rfl: 2 .  dipyridamole-aspirin (AGGRENOX) 200-25 MG per 12 hr capsule, Take 1 capsule by mouth 2 (two) times daily., Disp: 180 capsule, Rfl: 1 .  gabapentin (NEURONTIN) 300 MG capsule, Take 1 capsule (300 mg total) by mouth 2 (two) times daily., Disp: 180 capsule, Rfl: 1 .  glucose blood (ACCU-CHEK AVIVA PLUS) test strip, Use as instructed, Disp: 100 each, Rfl: 12 .  INCRUSE ELLIPTA 62.5 MCG/INH AEPB, Inhale 1 puff into the lungs daily., Disp: 3 each, Rfl: 1 .  Insulin Glargine (LANTUS SOLOSTAR) 100 UNIT/ML Solostar Pen, Inject 25 Units into the skin daily at 10 pm., Disp: 15 pen, Rfl: PRN .  Ipratropium-Albuterol (COMBIVENT RESPIMAT) 20-100 MCG/ACT AERS respimat, Inhale 1 puff into the lungs every 6 (six) hours as needed for wheezing or shortness of breath., Disp: 12 g, Rfl: 1 .  SitaGLIPtin-MetFORMIN HCl 50-1000 MG TB24, Take 1 tablet by mouth daily., Disp: 90 tablet, Rfl: 1 .  SPIRIVA HANDIHALER 18 MCG inhalation capsule, Place 1 capsule (18 mcg total) into inhaler and inhale 2 (two) times daily., Disp: 3 capsule, Rfl: 1 .  zolpidem (AMBIEN) 5 MG tablet, Take 1 tablet (5 mg total) by mouth at bedtime as needed for sleep., Disp: 90 tablet, Rfl: 1  No Known Allergies   ROS  Constitutional: Negative for fever , positive for  weight change ( loss ) .  Respiratory: Positive  for cough no  shortness of breath.   Cardiovascular: Negative for chest pain or palpitations.  Gastrointestinal: Negative for abdominal pain, no bowel changes.  Musculoskeletal: Positive  for gait problem and joint swelling.  Skin: Negative for rash.  Neurological: Negative for dizziness or headache.  No other specific complaints in a complete review of systems (except as listed in HPI above).  Objective  Filed Vitals:   06/07/15 1040  BP: 116/72  Pulse: 93  Temp: 98 F (36.7 C)  TempSrc: Oral  Resp: 16  Height: $Remove'5\' 4"'FLuUKpE$  (1.626 m)  Weight: 137 lb 11.2 oz (62.46 kg)  SpO2: 98%    Body mass index  is 23.62 kg/(m^2).  Physical Exam  Constitutional: Patient appears well-developed and well-nourished.  No distress.  HEENT: head atraumatic, normocephalic, pupils equal and reactive to light,neck supple, throat within normal limits Cardiovascular: Normal rate, regular rhythm and normal heart sounds.  No murmur heard. No BLE edema. Pulmonary/Chest: Effort normal and breath sounds normal. No respiratory distress. Chest wall has a hard , raised area, seems bone on left anterior rib cage, non tender Abdominal: Soft.  There is no tenderness. Psychiatric: Patient has a normal mood and affect. behavior is normal. Judgment and thought content normal. Muscular skeletal: pain and swelling on both thumbs/wrist and decrease rom, also has pain with extension of right elbow, slow to get up from chair and antalgic gait, hammer toes  on both feet, thick toenails  Recent Results (from the past 2160 hour(s))  CBC with Differential     Status: None   Collection Time: 04/24/15  8:39 PM  Result Value Ref Range   WBC 7.2 3.6 - 11.0 K/uL   RBC 4.01 3.80 - 5.20 MIL/uL   Hemoglobin 12.5 12.0 - 16.0 g/dL   HCT 36.5 35.0 - 47.0 %   MCV 91.0 80.0 - 100.0 fL   MCH 31.3 26.0 - 34.0 pg   MCHC 34.4 32.0 - 36.0 g/dL   RDW 13.3 11.5 - 14.5 %   Platelets 242 150 - 440 K/uL   Neutrophils Relative % 45 %   Neutro Abs 3.3 1.4 - 6.5 K/uL   Lymphocytes Relative 46 %   Lymphs Abs 3.3 1.0 - 3.6 K/uL   Monocytes Relative 5 %   Monocytes Absolute 0.3 0.2 - 0.9 K/uL   Eosinophils Relative 3 %   Eosinophils Absolute 0.2 0 - 0.7 K/uL   Basophils Relative 1 %   Basophils Absolute 0.1 0 - 0.1 K/uL  Comprehensive metabolic panel     Status: None   Collection Time: 04/24/15  8:39 PM  Result Value Ref Range   Sodium 140 135 - 145 mmol/L   Potassium 3.9 3.5 - 5.1 mmol/L   Chloride 105 101 - 111 mmol/L   CO2 28 22 - 32 mmol/L   Glucose, Bld 90 65 - 99 mg/dL   BUN 15 6 - 20 mg/dL   Creatinine, Ser 0.63 0.44 - 1.00 mg/dL    Calcium 9.7 8.9 - 10.3 mg/dL   Total Protein 7.3 6.5 - 8.1 g/dL   Albumin 4.1 3.5 - 5.0 g/dL   AST 16 15 - 41 U/L   ALT 15 14 - 54 U/L   Alkaline Phosphatase 69 38 - 126 U/L   Total Bilirubin 0.3 0.3 - 1.2 mg/dL   GFR calc non Af Amer >60 >60 mL/min   GFR calc Af Amer >60 >60 mL/min    Comment: (NOTE) The eGFR has been calculated using the CKD EPI equation. This calculation has not been validated in all clinical situations. eGFR's persistently <60 mL/min signify possible Chronic Kidney Disease.    Anion gap 7 5 - 15  Troponin I     Status: None   Collection Time: 04/24/15  8:39 PM  Result Value Ref Range   Troponin I <0.03 <0.031 ng/mL    Comment:        NO INDICATION OF MYOCARDIAL INJURY.   Protime-INR     Status: Abnormal   Collection Time: 04/24/15  8:39 PM  Result Value Ref Range   Prothrombin Time 16.1 (H) 11.4 - 15.0 seconds   INR 1.27   APTT     Status: None   Collection Time: 04/24/15  8:39 PM  Result Value Ref Range   aPTT 31 24 - 36 seconds  Urinalysis complete, with microscopic ([facility] only)     Status: Abnormal   Collection Time: 04/24/15  9:26 PM  Result Value Ref Range   Color, Urine YELLOW (A) YELLOW   APPearance CLEAR (A) CLEAR   Glucose, UA NEGATIVE NEGATIVE mg/dL   Bilirubin Urine NEGATIVE NEGATIVE   Ketones, ur NEGATIVE NEGATIVE mg/dL   Specific Gravity, Urine 1.011 1.005 - 1.030   Hgb urine dipstick NEGATIVE NEGATIVE   pH 5.0 5.0 - 8.0   Protein, ur NEGATIVE NEGATIVE mg/dL   Nitrite NEGATIVE NEGATIVE   Leukocytes, UA NEGATIVE NEGATIVE  RBC / HPF 0-5 0 - 5 RBC/hpf   WBC, UA 0-5 0 - 5 WBC/hpf   Bacteria, UA RARE (A) NONE SEEN   Squamous Epithelial / LPF 0-5 (A) NONE SEEN   Mucous PRESENT   POCT HgB A1C     Status: Normal   Collection Time: 06/07/15 10:51 AM  Result Value Ref Range   Hemoglobin A1C 5.9   POCT UA - Microalbumin     Status: Normal   Collection Time: 06/07/15 10:52 AM  Result Value Ref Range   Microalbumin Ur, POC negative mg/L    Creatinine, POC  mg/dL   Albumin/Creatinine Ratio, Urine, POC      Diabetic Foot Exam: Diabetic Foot Exam - Simple   Simple Foot Form  Visual Inspection  See comments:  Yes  Sensation Testing  Intact to touch and monofilament testing bilaterally:  Yes  Pulse Check  Posterior Tibialis and Dorsalis pulse intact bilaterally:  Yes  Comments  Hammer toes and thick toenails    thick toenails, and hammer toes  PHQ2/9: Depression screen PHQ 2/9 06/07/2015  Decreased Interest 0  Down, Depressed, Hopeless 1  PHQ - 2 Score 1     Fall Risk: Fall Risk  06/07/2015  Falls in the past year? No     Functional Status Survey: Is the patient deaf or have difficulty hearing?: No Does the patient have difficulty seeing, even when wearing glasses/contacts?: No Does the patient have difficulty walking or climbing stairs?: No Does the patient have difficulty dressing or bathing?: No Does the patient have difficulty doing errands alone such as visiting a doctor's office or shopping?: Yes (does not drive)    Assessment & Plan  1. Secondary diabetes with peripheral neuropathy hgbA1C is low, she has lost weight, we will stop pre-meal insulin and decrease dose of Lantus to avoid hypoglycemia  - POCT HgB A1C - POCT UA - Microalbumin - glucose blood (ACCU-CHEK AVIVA PLUS) test strip; Use as instructed  Dispense: 100 each; Refill: 12 - SitaGLIPtin-MetFORMIN HCl 50-1000 MG TB24; Take 1 tablet by mouth daily.  Dispense: 90 tablet; Refill: 1  2. Needs flu shot  - Flu Vaccine QUAD 36+ mos PF IM (Fluarix & Fluzone Quad PF)  3. COPD, moderate Doing better - INCRUSE ELLIPTA 62.5 MCG/INH AEPB; Inhale 1 puff into the lungs daily.  Dispense: 3 each; Refill: 1 - SPIRIVA HANDIHALER 18 MCG inhalation capsule; Place 1 capsule (18 mcg total) into inhaler and inhale 2 (two) times daily.  Dispense: 3 capsule; Refill: 1 - Ipratropium-Albuterol (COMBIVENT RESPIMAT) 20-100 MCG/ACT AERS respimat; Inhale 1 puff  into the lungs every 6 (six) hours as needed for wheezing or shortness of breath.  Dispense: 12 g; Refill: 1  4. Neuropathy She states she has been out of medication and pain has increased  - gabapentin (NEURONTIN) 300 MG capsule; Take 1 capsule (300 mg total) by mouth 2 (two) times daily.  Dispense: 180 capsule; Refill: 1  5. Dyslipidemia Change from Pravastatin to Atorvastatin since she has a history of TIA and needs a high dose statin therapy  - atorvastatin (LIPITOR) 40 MG tablet; Take 1 tablet (40 mg total) by mouth daily. In place of Pravastatin  Dispense: 90 tablet; Refill: 1 - Lipid panel  6. History of cerebrovascular accident with residual deficit Continue statin also  - dipyridamole-aspirin (AGGRENOX) 200-25 MG per 12 hr capsule; Take 1 capsule by mouth 2 (two) times daily.  Dispense: 180 capsule; Refill: 1  7. Insomnia Doing well on  medication  - zolpidem (AMBIEN) 5 MG tablet; Take 1 tablet (5 mg total) by mouth at bedtime as needed for sleep.  Dispense: 90 tablet; Refill: 1  8. Vitamin D deficiency  - Vit D  25 hydroxy (rtn osteoporosis monitoring)  9. B12 deficiency  - Vitamin B12  10. Weight loss  - C-reactive protein - Thyroid Panel With TSH  11. Tenosynovitis of wrist  Missed appointment with Ortho, refer back  - Ambulatory referral to Orthopedic Surgery  12. Other osteoarthritis involving multiple joints  - acetaminophen (TYLENOL) 500 MG tablet; Take 1 tablet (500 mg total) by mouth every 8 (eight) hours as needed.  Dispense: 270 tablet; Refill: 1  13. Protein calorie malnutrition  Discussed importance of increase in protein intake, she has been nauseated and we will check to see the cause of that  14. Nausea  - US Abdomen Limited; Future  15. Acquired chest/rib deformity  - DG Ribs Unilateral Left; Future

## 2015-06-08 ENCOUNTER — Other Ambulatory Visit: Payer: Self-pay | Admitting: Family Medicine

## 2015-06-08 DIAGNOSIS — E559 Vitamin D deficiency, unspecified: Secondary | ICD-10-CM

## 2015-06-08 LAB — THYROID PANEL WITH TSH
Free Thyroxine Index: 2.5 (ref 1.2–4.9)
T3 Uptake Ratio: 25 % (ref 24–39)
T4, Total: 10 ug/dL (ref 4.5–12.0)
TSH: 0.859 u[IU]/mL (ref 0.450–4.500)

## 2015-06-08 LAB — VITAMIN D 25 HYDROXY (VIT D DEFICIENCY, FRACTURES): Vit D, 25-Hydroxy: 17.6 ng/mL — ABNORMAL LOW (ref 30.0–100.0)

## 2015-06-08 LAB — LIPID PANEL
Chol/HDL Ratio: 3.2 ratio units (ref 0.0–4.4)
Cholesterol, Total: 177 mg/dL (ref 100–199)
HDL: 56 mg/dL (ref >39)
LDL Calculated: 93 mg/dL (ref 0–99)
Triglycerides: 138 mg/dL (ref 0–149)
VLDL Cholesterol Cal: 28 mg/dL (ref 5–40)

## 2015-06-08 LAB — VITAMIN B12: Vitamin B-12: 523 pg/mL (ref 211–946)

## 2015-06-08 LAB — C-REACTIVE PROTEIN: CRP: 0.8 mg/L (ref 0.0–4.9)

## 2015-06-08 MED ORDER — VITAMIN D (ERGOCALCIFEROL) 1.25 MG (50000 UNIT) PO CAPS: 50000.0000 [IU] | ORAL_CAPSULE | ORAL | Status: DC

## 2015-06-08 NOTE — Progress Notes (Signed)
Patient notified

## 2015-06-10 ENCOUNTER — Other Ambulatory Visit: Payer: Self-pay | Admitting: Family Medicine

## 2015-06-11 ENCOUNTER — Telehealth: Payer: Self-pay | Admitting: Family Medicine

## 2015-06-16 ENCOUNTER — Ambulatory Visit
Admission: RE | Admit: 2015-06-16 | Discharge: 2015-06-16 | Disposition: A | Discharge status: Home / Self Care | Payer: Commercial Managed Care - HMO | Source: Ambulatory Visit | Attending: Family Medicine | Admitting: Family Medicine

## 2015-06-16 DIAGNOSIS — K824 Cholesterolosis of gallbladder: Secondary | ICD-10-CM | POA: Insufficient documentation

## 2015-06-16 DIAGNOSIS — R1010 Upper abdominal pain, unspecified: Secondary | ICD-10-CM | POA: Diagnosis not present

## 2015-06-16 DIAGNOSIS — R6889 Other general symptoms and signs: Secondary | ICD-10-CM | POA: Diagnosis not present

## 2015-06-16 DIAGNOSIS — R110 Nausea: Secondary | ICD-10-CM | POA: Diagnosis not present

## 2015-06-17 ENCOUNTER — Telehealth: Payer: Self-pay

## 2015-06-17 ENCOUNTER — Other Ambulatory Visit: Payer: Self-pay

## 2015-06-17 DIAGNOSIS — R110 Nausea: Secondary | ICD-10-CM

## 2015-06-17 DIAGNOSIS — R634 Abnormal weight loss: Secondary | ICD-10-CM

## 2015-06-17 NOTE — Telephone Encounter (Signed)
ERRENOUS °

## 2015-06-17 NOTE — Telephone Encounter (Signed)
Go ahead and do referral to GI she states she has transportation do [place] but [place] office

## 2015-06-17 NOTE — Progress Notes (Signed)
Pt.notified

## 2015-06-18 ENCOUNTER — Encounter: Payer: Self-pay | Admitting: Gastroenterology

## 2015-06-21 DIAGNOSIS — M180 Bilateral primary osteoarthritis of first carpometacarpal joints: Secondary | ICD-10-CM | POA: Diagnosis not present

## 2015-07-02 DIAGNOSIS — R6889 Other general symptoms and signs: Secondary | ICD-10-CM | POA: Diagnosis not present

## 2015-07-02 DIAGNOSIS — M180 Bilateral primary osteoarthritis of first carpometacarpal joints: Secondary | ICD-10-CM | POA: Diagnosis not present

## 2015-07-06 ENCOUNTER — Telehealth: Payer: Self-pay | Admitting: Family Medicine

## 2015-07-06 NOTE — Telephone Encounter (Signed)
Will you do this? 

## 2015-07-06 NOTE — Telephone Encounter (Signed)
He will have to come in with her and bring a FMLA form, I need specifics on how he is helping her

## 2015-07-06 NOTE — Telephone Encounter (Signed)
Requesting a note or letter stating that her son Kendra [name]  help take care of her for his job purposes

## 2015-07-08 ENCOUNTER — Telehealth: Payer: Self-pay | Admitting: Family Medicine

## 2015-07-08 NOTE — Telephone Encounter (Signed)
Pt would like a call back. She has [employer] and they want to know if she [place] updated on her shots. Please advise pt.

## 2015-07-08 NOTE — Patient Outreach (Signed)
[place] [name]) Care Management  07/08/2015  Kendra [name] 05/20/1946 [id number]   Referral from [employer] Tier 2 List, assigned Jon Billings, RN to outreach for [place] Management services.  Thanks, Ronnell Freshwater. [place], [place] Assistant Phone: [phone] Fax: [phone]

## 2015-07-09 ENCOUNTER — Other Ambulatory Visit: Payer: Self-pay

## 2015-07-09 DIAGNOSIS — G5602 Carpal tunnel syndrome, left upper limb: Secondary | ICD-10-CM | POA: Diagnosis not present

## 2015-07-09 DIAGNOSIS — R6889 Other general symptoms and signs: Secondary | ICD-10-CM | POA: Diagnosis not present

## 2015-07-09 DIAGNOSIS — M180 Bilateral primary osteoarthritis of first carpometacarpal joints: Secondary | ICD-10-CM | POA: Diagnosis not present

## 2015-07-09 NOTE — Patient Outreach (Signed)
[place] [facility]) Care Management  07/09/2015  Kendra [name] Sep 22, 1945 [id number]   First telephone call to patient regarding  Tier 2 referral.  No answer.     Plan: RN Health Coach will attempt telephone outreach within 1-2 weeks.    Jone Baseman, RN, MSN [place] [phone]

## 2015-07-12 ENCOUNTER — Other Ambulatory Visit: Payer: Self-pay

## 2015-07-12 DIAGNOSIS — J441 Chronic obstructive pulmonary disease with (acute) exacerbation: Secondary | ICD-10-CM

## 2015-07-12 NOTE — Patient Outreach (Signed)
[place] [facility]) Care Management  07/12/2015  Kendra [name] Sep 12, 1946 [id number]   Request from Jon Billings, RN to assign [place], assigned Kendra Shock, RN.  Thanks, Ronnell Freshwater. [place], [place] Assistant Phone: [phone] Fax: [phone]

## 2015-07-12 NOTE — Patient Outreach (Signed)
[place] [facility]) Care Management  07/12/2015  Kendra [name] Dec 30, 1945 [id number]   Telephone call to patient for Tier 2 referral.  Patient admits to COPD and DM.  She reports her last A1c was 5.9. She reports she uses control inhaler and rescue inhaler. Patient has done flu vaccine for this season. Patient has had 1 fall this year related to ? TIA.  Patient in agreement to services.  Plan: RN Health Coach will do referral for health coach for education on COPD for self management.  Jone Baseman, RN, MSN [place] [phone]

## 2015-07-13 ENCOUNTER — Telehealth: Payer: Self-pay

## 2015-07-13 NOTE — Telephone Encounter (Signed)
Surgical clearance appointment scheduled for 07-21-15 for 67min.

## 2015-07-13 NOTE — Telephone Encounter (Signed)
Patient needs surgical clearance appointment scheduled before 07-28-15.  Please call and schedule

## 2015-07-15 ENCOUNTER — Other Ambulatory Visit: Payer: Self-pay | Admitting: *Deleted

## 2015-07-15 NOTE — Patient Outreach (Signed)
[place] [facility]) Care Management  07/15/2015  Kendra [name] 1946/05/27 [id number]   RN Health Coach attempted outreach call to Tier 2 patient to discuss services of [place]. Patient asked that I call her back before we close. She is expecting a call from her doctor. HIPPA compliance voicemail acknowledge. [place] Care Management [phone]

## 2015-07-15 NOTE — Patient Outreach (Signed)
[place] [facility]) Care Management  07/15/2015  RHIA BLATCHFORD January 15, 1946 [id number]   RN Health Coach attempted outreach call to  Patient at designated time. Per patient she still has company . Left6 patient my number and per patient she will call me in the morning. HIPPA compliance acknowledged.  [place] Care Management [phone]

## 2015-07-16 ENCOUNTER — Encounter: Payer: Self-pay | Admitting: Family Medicine

## 2015-07-16 DIAGNOSIS — M189 Osteoarthritis of first carpometacarpal joint, unspecified: Secondary | ICD-10-CM | POA: Insufficient documentation

## 2015-07-19 ENCOUNTER — Other Ambulatory Visit: Payer: Self-pay | Admitting: Family Medicine

## 2015-07-19 NOTE — Telephone Encounter (Signed)
.  [facility]

## 2015-07-19 NOTE — Telephone Encounter (Signed)
Pt stated that she is unable to make her appointment due to her insurance cutting out her transportation coverage services until next yr. (Pt is allowed 12 per year and she has already used all of them up)

## 2015-07-19 NOTE — Telephone Encounter (Signed)
Patient requesting refill. 

## 2015-07-20 ENCOUNTER — Other Ambulatory Visit: Payer: Self-pay | Admitting: Family Medicine

## 2015-07-20 ENCOUNTER — Other Ambulatory Visit: Payer: Self-pay

## 2015-07-20 DIAGNOSIS — R6889 Other general symptoms and signs: Secondary | ICD-10-CM

## 2015-07-20 DIAGNOSIS — Z01818 Encounter for other preprocedural examination: Secondary | ICD-10-CM

## 2015-07-21 ENCOUNTER — Ambulatory Visit: Payer: Commercial Managed Care - HMO | Admitting: Family Medicine

## 2015-07-21 ENCOUNTER — Other Ambulatory Visit: Payer: Self-pay

## 2015-07-23 ENCOUNTER — Other Ambulatory Visit: Payer: Self-pay

## 2015-07-27 ENCOUNTER — Other Ambulatory Visit: Payer: Self-pay

## 2015-07-27 ENCOUNTER — Ambulatory Visit: Payer: Commercial Managed Care - HMO | Admitting: Gastroenterology

## 2015-07-28 ENCOUNTER — Ambulatory Visit: Admit: 2015-07-28 | Payer: Self-pay | Admitting: Specialist

## 2015-07-28 SURGERY — ARTHROPLASTY, FINGER
Anesthesia: Choice | Laterality: Right

## 2015-07-29 ENCOUNTER — Ambulatory Visit: Payer: Commercial Managed Care - HMO | Admitting: Family Medicine

## 2015-07-30 ENCOUNTER — Other Ambulatory Visit: Payer: Self-pay | Admitting: *Deleted

## 2015-07-30 ENCOUNTER — Encounter: Payer: Self-pay | Admitting: *Deleted

## 2015-07-30 NOTE — Patient Outreach (Signed)
[place] [facility]) Care Management  07/30/2015  Kendra [name] 1945/11/17 [id number]   RN Health Coach  attempted third outreach call to patient. Per patient this is not a good time. Per patient she is washing clothes and trying to get them hung out.  The consumer has agreed to participate but does not respond to follow-up calls attempts/contacts.   Plan: RN Health coach will close this case out. RN Health Coach will send informational packet on [place] [facility] a [place] Management [phone]

## 2015-08-02 ENCOUNTER — Other Ambulatory Visit: Payer: Self-pay

## 2015-08-04 ENCOUNTER — Ambulatory Visit
Admission: RE | Admit: 2015-08-04 | Payer: Commercial Managed Care - HMO | Source: Ambulatory Visit | Admitting: Specialist

## 2015-08-04 ENCOUNTER — Encounter: Admission: RE | Payer: Self-pay | Source: Ambulatory Visit

## 2015-08-04 SURGERY — ARTHROPLASTY, FINGER
Anesthesia: Choice | Laterality: Right

## 2015-08-09 ENCOUNTER — Other Ambulatory Visit: Payer: Self-pay | Admitting: Family Medicine

## 2015-08-10 ENCOUNTER — Telehealth: Payer: Self-pay | Admitting: Family Medicine

## 2015-08-10 NOTE — Telephone Encounter (Signed)
Requesting a referral to heart specialist. Patient already have appointment for August 30, 2015 with Dr Gigi Gin [name]).

## 2015-08-10 NOTE — Telephone Encounter (Signed)
Patient requesting refill. 

## 2015-08-11 NOTE — Telephone Encounter (Signed)
I will need a reason for the referral. Why is she going to go see him?

## 2015-08-11 NOTE — Telephone Encounter (Signed)
Patient is going to the Cardiologist due to cardiac clearance for surgery.

## 2015-08-12 ENCOUNTER — Other Ambulatory Visit: Payer: Self-pay | Admitting: Family Medicine

## 2015-08-12 DIAGNOSIS — Z01810 Encounter for preprocedural cardiovascular examination: Secondary | ICD-10-CM

## 2015-08-19 ENCOUNTER — Telehealth: Payer: Self-pay | Admitting: Family Medicine

## 2015-08-19 NOTE — Telephone Encounter (Signed)
Contacted pt to ask about referral to pain management, Pt states she has to wait to get her transportation figured out and she will call us back to go ahead and do the referral then.

## 2015-08-19 NOTE — Telephone Encounter (Signed)
Pt states she has arthritis really bad and has been advised to take Tylenol for pain, pt states that is not working for her and wants to know if she can get something for her pain.

## 2015-08-19 NOTE — Telephone Encounter (Signed)
I can refer her to pain management, she has seen Ortho already

## 2015-08-24 ENCOUNTER — Other Ambulatory Visit: Payer: Self-pay | Admitting: Family Medicine

## 2015-08-24 DIAGNOSIS — I6930 Unspecified sequelae of cerebral infarction: Secondary | ICD-10-CM

## 2015-08-24 DIAGNOSIS — E785 Hyperlipidemia, unspecified: Secondary | ICD-10-CM

## 2015-08-24 DIAGNOSIS — G629 Polyneuropathy, unspecified: Secondary | ICD-10-CM

## 2015-08-24 DIAGNOSIS — G4700 Insomnia, unspecified: Secondary | ICD-10-CM

## 2015-08-24 NOTE — Telephone Encounter (Signed)
Please cal her and go over her list

## 2015-08-24 NOTE — Telephone Encounter (Signed)
Pt would like a call back about her med list.

## 2015-08-26 ENCOUNTER — Ambulatory Visit: Payer: Commercial Managed Care - HMO | Admitting: Family Medicine

## 2015-09-29 ENCOUNTER — Ambulatory Visit
Admission: RE | Admit: 2015-09-29 | Discharge: 2015-09-29 | Disposition: A | Discharge status: Home / Self Care | Payer: Commercial Managed Care - HMO | Source: Ambulatory Visit | Attending: Family Medicine | Admitting: Family Medicine

## 2015-09-29 ENCOUNTER — Encounter: Payer: Self-pay | Admitting: Family Medicine

## 2015-09-29 ENCOUNTER — Ambulatory Visit (INDEPENDENT_AMBULATORY_CARE_PROVIDER_SITE_OTHER): Payer: Commercial Managed Care - HMO | Admitting: Family Medicine

## 2015-09-29 VITALS — BP 110/66 | HR 100 | Temp 97.7°F | Resp 18 | Wt 141.8 lb

## 2015-09-29 DIAGNOSIS — M47818 Spondylosis without myelopathy or radiculopathy, sacral and sacrococcygeal region: Secondary | ICD-10-CM | POA: Diagnosis not present

## 2015-09-29 DIAGNOSIS — W19XXXA Unspecified fall, initial encounter: Secondary | ICD-10-CM

## 2015-09-29 DIAGNOSIS — M533 Sacrococcygeal disorders, not elsewhere classified: Secondary | ICD-10-CM | POA: Diagnosis not present

## 2015-09-29 DIAGNOSIS — S3992XA Unspecified injury of lower back, initial encounter: Secondary | ICD-10-CM | POA: Diagnosis not present

## 2015-09-29 MED ORDER — TRAMADOL HCL 50 MG PO TABS: 50.0000 mg | ORAL_TABLET | Freq: Four times a day (QID) | ORAL | Status: DC | PRN

## 2015-09-29 NOTE — Progress Notes (Signed)
Name: Kendra [name]   MRN: [id number]    DOB: [birth year]   Date:09/29/2015       Progress Note  Subjective  Chief Complaint  Chief Complaint  Patient presents with  . Fall    HPI  Fall at home: she had placed some heavy candles on her floor this past Sunday ( 4 days ago ) because she was expecting her power to go off. She was returning from the bathroom and was about to get in bed, but left foot kicked the candle that was on the floor, she lost her balance and fell backwards on her buttocks. Her son helped her get up. She states the pain was initially a 10/10. She has been using a heating pad and pain is now down to 8/10. Pain is described as a constant aching. Worse when sitting down, better when standing up. She denies numbness, bowel or bladder incontinence, able to walk.   Patient Active Problem List   Diagnosis Date Noted  . CMC arthritis, thumb, degenerative 07/16/2015  . Weight loss 06/07/2015  . Tenosynovitis of wrist 06/07/2015  . Protein calorie malnutrition (HCC) 06/07/2015  . Cataract 03/30/2015  . Other and unspecified disc disorder of cervical region 03/30/2015  . Insomnia 03/30/2015  . COPD, moderate (HCC) 03/30/2015  . Clinical depression 03/30/2015  . Dyslipidemia 03/30/2015  . Gastro-esophageal reflux disease without esophagitis 03/30/2015  . Increased thickness of nail 03/30/2015  . History of cerebrovascular accident with residual deficit 03/30/2015  . Neuropathy (HCC) 03/30/2015  . Osteoarthritis 03/30/2015  . Osteopenia 03/30/2015  . Allergic rhinitis 03/30/2015  . B12 deficiency 03/30/2015  . Tobacco use disorder 03/30/2015  . Vitamin D deficiency 03/30/2015  . Closed fracture of one or more phalanges of foot 08/12/2014  . Diabetic peripheral neuropathy associated with type 2 diabetes mellitus (HCC) 08/12/2014    Past Surgical History  Procedure Laterality Date  . Breast biopsy    . Neck surgery    . Ovarian cyst removal      History reviewed.  No pertinent family history.  Social History   Social History  . Marital Status: Divorced    Spouse Name: N/A  . Number of Children: N/A  . Years of Education: N/A   Occupational History  . Not on file.   Social History Main Topics  . Smoking status: Current Some Day Smoker  . Smokeless tobacco: Never Used  . Alcohol Use: No  . Drug Use: No  . Sexual Activity: Not on file   Other Topics Concern  . Not on file   Social History Narrative     Current outpatient prescriptions:  .  ACCU-CHEK AVIVA PLUS test strip, CHECK BLOOD SUGAR FOUR TIMES DAILY, Disp: 100 each, Rfl: 5 .  ACCU-CHEK SOFTCLIX LANCETS lancets, CHECK BLOOD SUGAR FOUR TIMES DAILY, Disp: 400 each, Rfl: 5 .  acetaminophen (TYLENOL) 500 MG tablet, Take 1 tablet (500 mg total) by mouth every 8 (eight) hours as needed. (Patient not taking: Reported on 07/12/2015), Disp: 270 tablet, Rfl: 1 .  atorvastatin (LIPITOR) 40 MG tablet, Take 1 tablet (40 mg total) by mouth daily. In place of Pravastatin, Disp: 90 tablet, Rfl: 1 .  BD PEN NEEDLE NANO U/F 32G X 4 MM MISC, use WITH LANTUS DAILY AND APIDRA., Disp: 100 each, Rfl: 2 .  Blood Glucose Monitoring Suppl (ACCU-CHEK AVIVA PLUS) W/DEVICE KIT, CHECK BLOOD SUGAR FOUR TIMES DAILY, Disp: 1 kit, Rfl: 0 .  cyclobenzaprine (FLEXERIL) 10 MG tablet, take 1 tablet  by mouth at bedtime for SPASMS, Disp: 30 tablet, Rfl: 0 .  dipyridamole-aspirin (AGGRENOX) 200-25 MG per 12 hr capsule, Take 1 capsule by mouth 2 (two) times daily., Disp: 180 capsule, Rfl: 1 .  gabapentin (NEURONTIN) 300 MG capsule, Take 1 capsule (300 mg total) by mouth 2 (two) times daily., Disp: 180 capsule, Rfl: 1 .  INCRUSE ELLIPTA 62.5 MCG/INH AEPB, Inhale 1 puff into the lungs daily., Disp: 3 each, Rfl: 1 .  Insulin Glargine (LANTUS SOLOSTAR) 100 UNIT/ML Solostar Pen, Inject 25 Units into the skin daily at 10 pm., Disp: 15 pen, Rfl: PRN .  Ipratropium-Albuterol (COMBIVENT RESPIMAT) 20-100 MCG/ACT AERS respimat, Inhale 1  puff into the lungs every 6 (six) hours as needed for wheezing or shortness of breath., Disp: 12 g, Rfl: 1 .  SitaGLIPtin-MetFORMIN HCl 50-1000 MG TB24, Take 1 tablet by mouth daily. (Patient not taking: Reported on 07/12/2015), Disp: 90 tablet, Rfl: 1 .  tiotropium (SPIRIVA) 18 MCG inhalation capsule, Place 18 mcg into inhaler and inhale daily., Disp: , Rfl:  .  Vitamin D, Ergocalciferol, (DRISDOL) 50000 UNITS CAPS capsule, Take 1 capsule (50,000 Units total) by mouth every 7 (seven) days. (Patient not taking: Reported on 07/12/2015), Disp: 12 capsule, Rfl: 0 .  zolpidem (AMBIEN) 5 MG tablet, Take 1 tablet (5 mg total) by mouth at bedtime as needed for sleep., Disp: 90 tablet, Rfl: 1  No Known Allergies   ROS  Ten systems reviewed and is negative except as mentioned in HPI   Objective  Filed Vitals:   09/29/15 0945  BP: 110/66  Pulse: 100  Temp: 97.7 F (36.5 C)  TempSrc: Oral  Resp: 18  Weight: 141 lb 12.8 oz (64.32 kg)  SpO2: 98%    Body mass index is 24.33 kg/(m^2).  Physical Exam  Constitutional: Patient appears well-developed and well-nourished.No distress.  HEENT: head atraumatic, normocephalic, pupils equal and reactive to light,  neck supple, throat within normal limits Cardiovascular: Normal rate, regular rhythm and normal heart sounds.  No murmur heard. No BLE edema. Pulmonary/Chest: Effort normal and breath sounds normal. No respiratory distress. Abdominal: Soft.  There is no tenderness. Psychiatric: Patient has a normal mood and affect. behavior is normal. Judgment and thought content normal. Muscular Skeletal: slow to get up from chair, sitting on a pillow, stooping a little to walk, antalgia and slow gait. Tail bone is very sore to touch, decrease rom of lumbar spine   PHQ2/9: Depression screen [name] 2/9 09/29/2015 07/12/2015 06/07/2015  Decreased Interest 0 0 0  Down, Depressed, Hopeless 0 0 1  PHQ - 2 Score 0 0 1     Fall Risk: Fall Risk  09/29/2015  07/12/2015 06/07/2015  Falls in the past year? Yes Yes No  Number falls in past yr: 1 1 -  Injury with Fall? Yes No -  Follow up - Falls prevention discussed -      Functional Status Survey: Is the patient deaf or have difficulty hearing?: No Does the patient have difficulty seeing, even when wearing glasses/contacts?: No Does the patient have difficulty concentrating, remembering, or making decisions?: No Does the patient have difficulty walking or climbing stairs?: No Does the patient have difficulty dressing or bathing?: No Does the patient have difficulty doing errands alone such as visiting a doctor's office or shopping?: No    Assessment & Plan  1. Fall, initial encounter  Discussed importance of not having any items on the floor to avoid recurrence of fall  2. Sacral pain  -  DG Sacrum/Coccyx; Future - traMADol (ULTRAM) 50 MG tablet; Take 1 tablet (50 mg total) by mouth every 6 (six) hours as needed.  Dispense: 40 tablet; Refill: 0

## 2015-10-08 ENCOUNTER — Ambulatory Visit: Payer: Commercial Managed Care - HMO | Admitting: Family Medicine

## 2015-10-14 ENCOUNTER — Other Ambulatory Visit: Payer: Self-pay | Admitting: Family Medicine

## 2015-10-22 ENCOUNTER — Other Ambulatory Visit: Payer: Self-pay | Admitting: Family Medicine

## 2015-10-22 NOTE — Telephone Encounter (Signed)
Patient was prescribed Tramadol on 10-08-15 and due to being in severe pain she is now completely out. Please send in new refill to pharmacy

## 2015-10-23 NOTE — Telephone Encounter (Signed)
She needs to be seen.

## 2015-10-25 NOTE — Telephone Encounter (Signed)
Tried to call to notify patient phone has been disconnected.

## 2015-11-02 ENCOUNTER — Ambulatory Visit: Payer: Commercial Managed Care - HMO | Admitting: Family Medicine

## 2015-11-04 NOTE — Telephone Encounter (Signed)
Call in one month supply of insulin, atorvastatin and aggrenox only. Thank you

## 2015-11-04 NOTE — Telephone Encounter (Signed)
Per the request of Dr. Steele Sizer, I called in the requested prescriptions (insulin - 10 pens with 3 refills, atorvastatin - 40, Aggrenox - 200/25) to [facility] mail order..  I spoke to pharmacist, Erasmo Downer.

## 2015-11-08 ENCOUNTER — Ambulatory Visit: Payer: Commercial Managed Care - HMO | Admitting: Family Medicine

## 2015-12-03 ENCOUNTER — Telehealth: Payer: Self-pay | Admitting: Family Medicine

## 2015-12-03 NOTE — Telephone Encounter (Signed)
Patient is experiencing severe right hand pain that is radiating up her arm.  Patient wants to know what she can take.  Please call patient @ [phone].

## 2015-12-03 NOTE — Telephone Encounter (Signed)
She needs to go to [facility]. Just got message after 5 today

## 2015-12-14 ENCOUNTER — Other Ambulatory Visit: Payer: Self-pay

## 2015-12-14 DIAGNOSIS — G4700 Insomnia, unspecified: Secondary | ICD-10-CM

## 2015-12-14 NOTE — Telephone Encounter (Signed)
Refill request was sent to Dr. Krichna Sowles for approval and submission.  

## 2015-12-15 NOTE — Telephone Encounter (Signed)
Tried to contact patient number on file is disconnected.

## 2015-12-27 ENCOUNTER — Other Ambulatory Visit: Payer: Self-pay | Admitting: Family Medicine

## 2015-12-28 NOTE — Telephone Encounter (Signed)
Patient requesting refill. 

## 2015-12-29 NOTE — Telephone Encounter (Signed)
Tried contacting patient but number on file has been disconnected.

## 2015-12-29 NOTE — Telephone Encounter (Signed)
Last seen 09-29-15. She has cancelled three appointments this year and have cancelled several last year.

## 2016-01-18 ENCOUNTER — Encounter: Payer: Self-pay | Admitting: Family Medicine

## 2016-01-18 ENCOUNTER — Ambulatory Visit (INDEPENDENT_AMBULATORY_CARE_PROVIDER_SITE_OTHER): Payer: Commercial Managed Care - HMO | Admitting: Family Medicine

## 2016-01-18 VITALS — BP 108/60 | HR 84 | Temp 97.5°F | Resp 20 | Ht 64.0 in | Wt 147.8 lb

## 2016-01-18 DIAGNOSIS — M6588 Other synovitis and tenosynovitis, other site: Secondary | ICD-10-CM

## 2016-01-18 DIAGNOSIS — I6930 Unspecified sequelae of cerebral infarction: Secondary | ICD-10-CM

## 2016-01-18 DIAGNOSIS — G4700 Insomnia, unspecified: Secondary | ICD-10-CM

## 2016-01-18 DIAGNOSIS — Z79899 Other long term (current) drug therapy: Secondary | ICD-10-CM | POA: Diagnosis not present

## 2016-01-18 DIAGNOSIS — G629 Polyneuropathy, unspecified: Secondary | ICD-10-CM | POA: Diagnosis not present

## 2016-01-18 DIAGNOSIS — E46 Unspecified protein-calorie malnutrition: Secondary | ICD-10-CM | POA: Diagnosis not present

## 2016-01-18 DIAGNOSIS — E1142 Type 2 diabetes mellitus with diabetic polyneuropathy: Secondary | ICD-10-CM | POA: Diagnosis not present

## 2016-01-18 DIAGNOSIS — E785 Hyperlipidemia, unspecified: Secondary | ICD-10-CM

## 2016-01-18 DIAGNOSIS — M158 Other polyosteoarthritis: Secondary | ICD-10-CM | POA: Diagnosis not present

## 2016-01-18 DIAGNOSIS — M62838 Other muscle spasm: Secondary | ICD-10-CM | POA: Diagnosis not present

## 2016-01-18 DIAGNOSIS — J449 Chronic obstructive pulmonary disease, unspecified: Secondary | ICD-10-CM

## 2016-01-18 DIAGNOSIS — Z1211 Encounter for screening for malignant neoplasm of colon: Secondary | ICD-10-CM | POA: Diagnosis not present

## 2016-01-18 DIAGNOSIS — E559 Vitamin D deficiency, unspecified: Secondary | ICD-10-CM | POA: Diagnosis not present

## 2016-01-18 DIAGNOSIS — M7541 Impingement syndrome of right shoulder: Secondary | ICD-10-CM

## 2016-01-18 DIAGNOSIS — M659 Synovitis and tenosynovitis, unspecified: Secondary | ICD-10-CM

## 2016-01-18 DIAGNOSIS — M65939 Unspecified synovitis and tenosynovitis, unspecified forearm: Secondary | ICD-10-CM

## 2016-01-18 LAB — POCT GLYCOSYLATED HEMOGLOBIN (HGB A1C): Hemoglobin A1C: 6.5

## 2016-01-18 LAB — POCT UA - MICROALBUMIN: Microalbumin Ur, POC: 20 mg/L

## 2016-01-18 MED ORDER — GABAPENTIN 300 MG PO CAPS: 300.0000 mg | ORAL_CAPSULE | Freq: Three times a day (TID) | ORAL | Status: DC

## 2016-01-18 MED ORDER — TIZANIDINE HCL 4 MG PO TABS: 4.0000 mg | ORAL_TABLET | Freq: Every day | ORAL | Status: DC

## 2016-01-18 MED ORDER — ACETAMINOPHEN 500 MG PO TABS: 500.0000 mg | ORAL_TABLET | Freq: Three times a day (TID) | ORAL | Status: DC | PRN

## 2016-01-18 MED ORDER — ATORVASTATIN CALCIUM 40 MG PO TABS: 40.0000 mg | ORAL_TABLET | Freq: Every day | ORAL | Status: DC

## 2016-01-18 MED ORDER — ACCU-CHEK SOFTCLIX LANCETS MISC: 1.0000 | Freq: Three times a day (TID) | Status: DC

## 2016-01-18 MED ORDER — IPRATROPIUM-ALBUTEROL 20-100 MCG/ACT IN AERS: 1.0000 | INHALATION_SPRAY | Freq: Four times a day (QID) | RESPIRATORY_TRACT | Status: DC | PRN

## 2016-01-18 MED ORDER — ASPIRIN-DIPYRIDAMOLE ER 25-200 MG PO CP12
1.0000 | ORAL_CAPSULE | Freq: Two times a day (BID) | ORAL | Status: DC
Start: 2016-01-18 — End: 2017-03-30

## 2016-01-18 MED ORDER — GLUCOSE BLOOD VI STRP: 1.0000 | ORAL_STRIP | Freq: Three times a day (TID) | Status: DC

## 2016-01-18 MED ORDER — TIOTROPIUM BROMIDE MONOHYDRATE 18 MCG IN CAPS: 18.0000 ug | ORAL_CAPSULE | Freq: Every day | RESPIRATORY_TRACT | Status: DC

## 2016-01-18 MED ORDER — INSULIN GLARGINE 100 UNIT/ML SOLOSTAR PEN: 22.0000 [IU] | PEN_INJECTOR | Freq: Every day | SUBCUTANEOUS | Status: DC

## 2016-01-18 MED ORDER — SITAGLIP PHOS-METFORMIN HCL ER 50-1000 MG PO TB24: 1.0000 | ORAL_TABLET | Freq: Every day | ORAL | Status: DC

## 2016-01-18 MED ORDER — VITAMIN D (ERGOCALCIFEROL) 1.25 MG (50000 UNIT) PO CAPS: 50000.0000 [IU] | ORAL_CAPSULE | ORAL | Status: DC

## 2016-01-18 MED ORDER — INSULIN PEN NEEDLE 32G X 4 MM MISC: Status: DC

## 2016-01-18 MED ORDER — ZOLPIDEM TARTRATE 5 MG PO TABS: 5.0000 mg | ORAL_TABLET | Freq: Every evening | ORAL | Status: DC | PRN
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
## 2016-01-19 LAB — COMPREHENSIVE METABOLIC PANEL
ALT: 26 IU/L (ref 0–32)
AST: 16 IU/L (ref 0–40)
Albumin/Globulin Ratio: 1.8 (ref 1.2–2.2)
Albumin: 4.6 g/dL (ref 3.6–4.8)
Alkaline Phosphatase: 83 IU/L (ref 39–117)
BUN/Creatinine Ratio: 17 (ref 12–28)
BUN: 12 mg/dL (ref 8–27)
Bilirubin Total: 0.3 mg/dL (ref 0.0–1.2)
CO2: 25 mmol/L (ref 18–29)
Calcium: 9.7 mg/dL (ref 8.7–10.3)
Chloride: 100 mmol/L (ref 96–106)
Creatinine, Ser: 0.7 mg/dL (ref 0.57–1.00)
GFR calc Af Amer: 104 mL/min/{1.73_m2} (ref >59)
GFR calc non Af Amer: 90 mL/min/{1.73_m2} (ref >59)
Globulin, Total: 2.5 g/dL (ref 1.5–4.5)
Glucose: 118 mg/dL — ABNORMAL HIGH (ref 65–99)
Potassium: 4 mmol/L (ref 3.5–5.2)
Sodium: 141 mmol/L (ref 134–144)
Total Protein: 7.1 g/dL (ref 6.0–8.5)

## 2016-01-19 LAB — MAGNESIUM: Magnesium: 2.1 mg/dL (ref 1.6–2.3)

## 2016-01-27 ENCOUNTER — Telehealth: Payer: Self-pay | Admitting: Family Medicine

## 2016-01-27 NOTE — Telephone Encounter (Signed)
Pt states her hand is still swollen and was advised to take Tylenol and pt states she needs something else for her pain. Please advise.

## 2016-01-27 NOTE — Telephone Encounter (Signed)
She has seen ortho, we can send her to pain clinic if she would like, or she can contact her Ortho to treat her pain. I am sorry

## 2016-01-28 NOTE — Telephone Encounter (Signed)
Patient notified and states she has made a appointment to go see Dr. Sabra Heck in Orthopedics on Tuesday and her hand is very swollen. Informed her Dr. Ancil Boozer was sorry but she needed to follow up with Orthopedics or we could refer her to Pain Clinic and she denied pain clinic referral.

## 2016-01-28 NOTE — Telephone Encounter (Signed)
Called pt and informed pt of Dr Ancil Boozer suggestion. Pt understands and will wait on Orhto appt.

## 2016-02-28 ENCOUNTER — Other Ambulatory Visit: Payer: Self-pay | Admitting: Family Medicine

## 2016-02-28 DIAGNOSIS — J449 Chronic obstructive pulmonary disease, unspecified: Secondary | ICD-10-CM

## 2016-02-28 NOTE — Telephone Encounter (Signed)
REFILLS NEEDED ON CONTICENT INHALER. PHARM IS [facility].  PLEASE CALL SOME AT [facility] ON [address] TILL THE MAIL ORDER CAN COME FOR SHE IS TOTALLY OUT.

## 2016-02-29 MED ORDER — IPRATROPIUM-ALBUTEROL 20-100 MCG/ACT IN AERS: 1.0000 | INHALATION_SPRAY | Freq: Four times a day (QID) | RESPIRATORY_TRACT | Status: DC | PRN

## 2016-02-29 NOTE — Telephone Encounter (Signed)
Refill request was sent to Dr. Steele Sizer for approval and submission.   Patient needs a short dose sent to [facility] on [address] first then the rest to her mail order.

## 2016-03-16 ENCOUNTER — Other Ambulatory Visit: Payer: Self-pay | Admitting: Family Medicine

## 2016-03-16 NOTE — Telephone Encounter (Signed)
Patient requesting refill. 

## 2016-03-22 ENCOUNTER — Telehealth: Payer: Self-pay | Admitting: Family Medicine

## 2016-03-22 NOTE — Telephone Encounter (Signed)
Patient was informed of lab results and said thank you.

## 2016-03-22 NOTE — Telephone Encounter (Signed)
Requesting return call for test results °

## 2016-04-21 ENCOUNTER — Telehealth: Payer: Self-pay | Admitting: Family Medicine

## 2016-04-21 NOTE — Telephone Encounter (Signed)
Please advise 

## 2016-04-23 ENCOUNTER — Other Ambulatory Visit: Payer: Self-pay | Admitting: Family Medicine

## 2016-04-23 MED ORDER — CITALOPRAM HYDROBROMIDE 20 MG PO TABS: 20.0000 mg | ORAL_TABLET | Freq: Every day | ORAL | 0 refills | Status: DC

## 2016-04-23 NOTE — Telephone Encounter (Signed)
Sent Citalopram to [facility], she should have a follow up sooner if she is so depressed

## 2016-04-24 ENCOUNTER — Other Ambulatory Visit: Payer: Self-pay | Admitting: Family Medicine

## 2016-04-24 NOTE — Telephone Encounter (Signed)
Patient notified and has a follow up on 05/23/16 with Dr. Ancil Boozer

## 2016-05-23 ENCOUNTER — Ambulatory Visit: Payer: Commercial Managed Care - HMO | Admitting: Family Medicine

## 2016-05-23 ENCOUNTER — Other Ambulatory Visit: Payer: Self-pay | Admitting: Family Medicine

## 2016-05-23 DIAGNOSIS — J449 Chronic obstructive pulmonary disease, unspecified: Secondary | ICD-10-CM

## 2016-05-23 DIAGNOSIS — E1142 Type 2 diabetes mellitus with diabetic polyneuropathy: Secondary | ICD-10-CM

## 2016-05-24 NOTE — Telephone Encounter (Signed)
Patient requesting refill of Combivent and Accu-Chek Softclix lancets be sent to [facility].

## 2016-06-05 ENCOUNTER — Other Ambulatory Visit: Payer: Self-pay | Admitting: Family Medicine

## 2016-06-05 DIAGNOSIS — E785 Hyperlipidemia, unspecified: Secondary | ICD-10-CM

## 2016-06-05 DIAGNOSIS — J449 Chronic obstructive pulmonary disease, unspecified: Secondary | ICD-10-CM

## 2016-06-05 DIAGNOSIS — I6930 Unspecified sequelae of cerebral infarction: Secondary | ICD-10-CM

## 2016-06-05 DIAGNOSIS — M62838 Other muscle spasm: Secondary | ICD-10-CM

## 2016-06-06 NOTE — Telephone Encounter (Signed)
PT COMING IN ON 06-07-16 @ 8:20

## 2016-06-07 ENCOUNTER — Ambulatory Visit: Payer: Commercial Managed Care - HMO | Admitting: Family Medicine

## 2016-06-07 ENCOUNTER — Telehealth: Payer: Self-pay

## 2016-06-07 NOTE — Telephone Encounter (Signed)
Patient needs to know where to make the appointment at for the biopsy due to her imaging of her chest. Patient states Dr. Ancil Boozer showed her the image and she needs a biopsy done due to it showing something on her left side near her rib cage. Please advise I'm not showing a referral or this imaging. Thanks [phone]

## 2016-06-07 NOTE — Telephone Encounter (Signed)
I don't know either, I think she was scheduled to see me today and did not show. She will need an appointment

## 2016-06-08 NOTE — Telephone Encounter (Signed)
PT CALLED IN ON 06-07-16 AND [place] HER APPT TO [facility]. SHE IS COMING IN ON 06-20-16 FOR REFILL

## 2016-06-08 NOTE — Telephone Encounter (Signed)
Patient rescheduled for 06/20/16.

## 2016-06-08 NOTE — Telephone Encounter (Signed)
Pt states she has enough meds to last until her next appt

## 2016-06-08 NOTE — Telephone Encounter (Signed)
Please call in enough until follow up

## 2016-06-08 NOTE — Telephone Encounter (Signed)
Please inform patient we are only able to send in enough to her appointment to a local pharmacy since she was a no show to her appointment. Please let me know when her appointment is scheduled. Thanks

## 2016-06-12 NOTE — Telephone Encounter (Signed)
Call in until her follow up

## 2016-06-20 ENCOUNTER — Ambulatory Visit: Payer: Commercial Managed Care - HMO | Admitting: Family Medicine

## 2016-07-03 ENCOUNTER — Telehealth: Payer: Self-pay | Admitting: Family Medicine

## 2016-07-03 NOTE — Telephone Encounter (Signed)
She needs to take otc medication, I can't call in cough medication without seeing her

## 2016-07-03 NOTE — Telephone Encounter (Signed)
Patient notified to take otc medications.

## 2016-07-03 NOTE — Telephone Encounter (Signed)
PT SAYS THAT SHE HAS A BAD COUGH AND WANTS TO KNOW IF YOU WOULD CALL HER SOMETHING IN FOR IT. SHE FEELS LIKE SHE HAS A COLD  BUT THE COUGH KEEPS HER AWAKE. LOOKED AT APPT SCHEDULE AND YOU HAVE NOTHING THIS WEEK.

## 2016-07-04 ENCOUNTER — Encounter: Payer: Self-pay | Admitting: Emergency Medicine

## 2016-07-04 DIAGNOSIS — F17210 Nicotine dependence, cigarettes, uncomplicated: Secondary | ICD-10-CM | POA: Diagnosis not present

## 2016-07-04 DIAGNOSIS — R05 Cough: Secondary | ICD-10-CM | POA: Diagnosis not present

## 2016-07-04 DIAGNOSIS — Z794 Long term (current) use of insulin: Secondary | ICD-10-CM | POA: Insufficient documentation

## 2016-07-04 DIAGNOSIS — E119 Type 2 diabetes mellitus without complications: Secondary | ICD-10-CM | POA: Insufficient documentation

## 2016-07-04 DIAGNOSIS — J449 Chronic obstructive pulmonary disease, unspecified: Secondary | ICD-10-CM | POA: Insufficient documentation

## 2016-07-04 DIAGNOSIS — J40 Bronchitis, not specified as acute or chronic: Secondary | ICD-10-CM | POA: Insufficient documentation

## 2016-07-04 DIAGNOSIS — Z79899 Other long term (current) drug therapy: Secondary | ICD-10-CM | POA: Diagnosis not present

## 2016-07-04 DIAGNOSIS — J219 Acute bronchiolitis, unspecified: Secondary | ICD-10-CM | POA: Diagnosis not present

## 2016-07-04 NOTE — ED Triage Notes (Signed)
Pt presents to ED with frequent productive cough for the past 3 days. Pt states when she starts to cough it will be ongoing for about 10 minutes and makes her feel short of breath. Inhaler is not helping at home. +nasal congestion. Denies n/v/d. Pt has slight increased work of breathing noted at this time. Alert and answering questions without difficulty.

## 2016-07-05 ENCOUNTER — Emergency Department: Payer: Commercial Managed Care - HMO

## 2016-07-05 ENCOUNTER — Emergency Department
Admission: EM | Admit: 2016-07-05 | Discharge: 2016-07-05 | Disposition: A | Discharge status: Home / Self Care | Payer: Commercial Managed Care - HMO | Attending: Emergency Medicine | Admitting: Emergency Medicine

## 2016-07-05 DIAGNOSIS — R05 Cough: Secondary | ICD-10-CM | POA: Diagnosis not present

## 2016-07-05 DIAGNOSIS — J40 Bronchitis, not specified as acute or chronic: Secondary | ICD-10-CM

## 2016-07-05 DIAGNOSIS — R059 Cough, unspecified: Secondary | ICD-10-CM

## 2016-07-05 LAB — BASIC METABOLIC PANEL
Anion gap: 6 (ref 5–15)
BUN: 17 mg/dL (ref 6–20)
CO2: 29 mmol/L (ref 22–32)
Calcium: 9.5 mg/dL (ref 8.9–10.3)
Chloride: 105 mmol/L (ref 101–111)
Creatinine, Ser: 0.93 mg/dL (ref 0.44–1.00)
GFR calc Af Amer: >60 mL/min (ref >60)
GFR calc non Af Amer: >60 mL/min (ref >60)
Glucose, Bld: 159 mg/dL — ABNORMAL HIGH (ref 65–99)
Potassium: 3.4 mmol/L — ABNORMAL LOW (ref 3.5–5.1)
Sodium: 140 mmol/L (ref 135–145)

## 2016-07-05 LAB — CBC
HCT: 36.9 % (ref 35.0–47.0)
Hemoglobin: 12.6 g/dL (ref 12.0–16.0)
MCH: 31.9 pg (ref 26.0–34.0)
MCHC: 34.1 g/dL (ref 32.0–36.0)
MCV: 93.6 fL (ref 80.0–100.0)
Platelets: 259 10*3/uL (ref 150–440)
RBC: 3.95 MIL/uL (ref 3.80–5.20)
RDW: 13.8 % (ref 11.5–14.5)
WBC: 7.6 10*3/uL (ref 3.6–11.0)

## 2016-07-05 MED ORDER — PREDNISONE 20 MG PO TABS: 60.0000 mg | ORAL_TABLET | Freq: Every day | ORAL | 0 refills | Status: DC

## 2016-07-05 MED ORDER — IPRATROPIUM-ALBUTEROL 0.5-2.5 (3) MG/3ML IN SOLN
3.0000 mL | Freq: Once | RESPIRATORY_TRACT | Status: AC
  Administered 2016-07-05: 3 mL via RESPIRATORY_TRACT
  Filled 2016-07-05: qty 3

## 2016-07-05 MED ORDER — PREDNISONE 20 MG PO TABS
60.0000 mg | ORAL_TABLET | Freq: Once | ORAL | Status: AC
  Administered 2016-07-05: 60 mg via ORAL
  Filled 2016-07-05: qty 3

## 2016-07-05 MED ORDER — BENZONATATE 100 MG PO CAPS
100.0000 mg | ORAL_CAPSULE | Freq: Once | ORAL | Status: AC
  Administered 2016-07-05: 100 mg via ORAL
  Filled 2016-07-05: qty 1

## 2016-07-05 MED ORDER — ALBUTEROL SULFATE HFA 108 (90 BASE) MCG/ACT IN AERS: 2.0000 | INHALATION_SPRAY | Freq: Four times a day (QID) | RESPIRATORY_TRACT | 0 refills | Status: DC | PRN

## 2016-07-05 NOTE — ED Provider Notes (Signed)
[facility] Emergency Department Provider Note   ____________________________________________   First MD Initiated Contact with Patient 07/05/16 0402     (approximate)  I have reviewed the triage vital signs and the nursing notes.   HISTORY  Chief Complaint Cough and Shortness of Breath    HPI Kendra [name] is a 70 y.o. female who comes into the hospital today with a cough and shortness of breath. The patient reports that she's had these symptoms for the past 3-4 days and she coughs a lot while getting out of breath. The patient has been unable to sleep. The patient's family is concerned because at the very bad sounding cough. The patient spoke to her doctor and was told to get some NyQuil but she was unable to do so. The family reports his house deeper than a simple cold. The patient has had similar cough before. The patient does have an inhaler but thinks it does not work. The patient is using Combivent. The patient's cough is dry and nonproductive. She's had no fevers. The patient had some chest pains earlier but is not bothering her anymore. The patient has no headache with some blurred vision but no nausea or vomiting.   Past Medical History:  Diagnosis Date  . Back pain   . Cataract   . Cervical disc disorder   . Chest pain   . COPD (chronic obstructive pulmonary disease) ([place])   . Diabetes ([place])   . Diabetic neuropathy ([place])   . Hyperlipidemia   . Insomnia   . Neuropathy ([place])   . Osteoarthritis   . Osteopenia   . Reflux   . Tenosynovitis     Patient Active Problem List   Diagnosis Date Noted  . CMC arthritis, thumb, degenerative 07/16/2015  . Weight loss 06/07/2015  . Tenosynovitis of wrist 06/07/2015  . Protein calorie malnutrition ([place]) 06/07/2015  . Cataract 03/30/2015  . Other and unspecified disc disorder of cervical region 03/30/2015  . Insomnia 03/30/2015  . COPD, moderate ([place]) 03/30/2015  . Clinical depression  03/30/2015  . Dyslipidemia 03/30/2015  . Gastro-esophageal reflux disease without esophagitis 03/30/2015  . Increased thickness of nail 03/30/2015  . History of cerebrovascular accident with residual deficit 03/30/2015  . Neuropathy ([place]) 03/30/2015  . Osteoarthritis 03/30/2015  . Osteopenia 03/30/2015  . Allergic rhinitis 03/30/2015  . B12 deficiency 03/30/2015  . Tobacco use disorder 03/30/2015  . Vitamin D deficiency 03/30/2015  . Closed fracture of one or more phalanges of foot 08/12/2014  . Diabetic peripheral neuropathy associated with type 2 diabetes mellitus ([place]) 08/12/2014    Past Surgical History:  Procedure Laterality Date  . BREAST BIOPSY    . NECK SURGERY    . OVARIAN CYST REMOVAL      Prior to Admission medications   Medication Sig Start Date End Date Taking? Authorizing Provider  ACCU-CHEK AVIVA PLUS test strip USE  TO  TEST THREE TIMES DAILY AS DIRECTED 03/16/16   Steele Sizer, MD  ACCU-CHEK SOFTCLIX LANCETS lancets TEST 3 TIMES DAILY AS INSTRUCTED 05/24/16   Steele Sizer, MD  acetaminophen (TYLENOL) 500 MG tablet Take 1 tablet (500 mg total) by mouth every 8 (eight) hours as needed. 01/18/16   Steele Sizer, MD  albuterol (PROVENTIL HFA;VENTOLIN HFA) 108 (90 Base) MCG/ACT inhaler Inhale 2 puffs into the lungs every 6 (six) hours as needed for wheezing or shortness of breath. 07/05/16   Loney Hering, MD  atorvastatin (LIPITOR) 40 MG tablet Take 1 tablet (40  mg total) by mouth daily. In place of Pravastatin 01/18/16   Steele Sizer, MD  Blood Glucose Monitoring Suppl (ACCU-CHEK AVIVA PLUS) w/Device KIT CHECK BLOOD SUGAR FOUR TIMES DAILY 10/14/15   Steele Sizer, MD  citalopram (CELEXA) 20 MG tablet Take 1 tablet (20 mg total) by mouth daily. 04/23/16   Steele Sizer, MD  COMBIVENT RESPIMAT 20-100 MCG/ACT AERS respimat INHALE 1 PUFF EVERY 6 HOURS AS NEEDED FOR SHORTNESS OF BREATH  OR  WHEEZING. FOLLOW UP NEEDED. 05/24/16   Steele Sizer, MD  dipyridamole-aspirin  (AGGRENOX) 200-25 MG 12hr capsule Take 1 capsule by mouth 2 (two) times daily. 01/18/16   Steele Sizer, MD  gabapentin (NEURONTIN) 300 MG capsule Take 1 capsule (300 mg total) by mouth 3 (three) times daily. 01/18/16   Steele Sizer, MD  Insulin Glargine (LANTUS SOLOSTAR) 100 UNIT/ML Solostar Pen Inject 22 Units into the skin daily at 10 pm. 01/18/16   Steele Sizer, MD  Insulin Pen Needle (BD PEN NEEDLE NANO U/F) 32G X 4 MM MISC use WITH LANTUS 01/18/16   Steele Sizer, MD  predniSONE (DELTASONE) 20 MG tablet Take 3 tablets (60 mg total) by mouth daily. 07/05/16   Loney Hering, MD  SitaGLIPtin-MetFORMIN HCl 50-1000 MG TB24 Take 1 tablet by mouth daily. 01/18/16   Steele Sizer, MD  tiotropium (SPIRIVA HANDIHALER) 18 MCG inhalation capsule Place 1 capsule (18 mcg total) into inhaler and inhale daily. 01/18/16   Steele Sizer, MD  tiZANidine (ZANAFLEX) 4 MG tablet Take 1 tablet (4 mg total) by mouth at bedtime. 01/18/16   Steele Sizer, MD  Vitamin D, Ergocalciferol, (DRISDOL) 50000 units CAPS capsule Take 1 capsule (50,000 Units total) by mouth every 7 (seven) days. 01/18/16   Steele Sizer, MD  zolpidem (AMBIEN) 5 MG tablet Take 1 tablet (5 mg total) by mouth at bedtime as needed for sleep. 01/18/16   Steele Sizer, MD    Allergies Review of patient's allergies indicates no known allergies.  No family history on file.  Social History Social History  Substance Use Topics  . Smoking status: Current Some Day Smoker    Packs/day: 1.00    Types: Cigarettes  . Smokeless tobacco: Never Used  . Alcohol use No    Review of Systems Constitutional: No fever/chills Eyes: No visual changes. ENT: No sore throat. Cardiovascular: Denies chest pain. Respiratory: cough and shortness of breath. Gastrointestinal: No abdominal pain.  No nausea, no vomiting.  No diarrhea.  No constipation. Genitourinary: Negative for dysuria. Musculoskeletal: Negative for back pain. Skin: Negative for  rash. Neurological: Negative for headaches, focal weakness or numbness.  10-point ROS otherwise negative.  ____________________________________________   PHYSICAL EXAM:  VITAL SIGNS: ED Triage Vitals  Enc Vitals Group     BP 07/04/16 2341 124/72     Pulse Rate 07/04/16 2341 97     Resp 07/04/16 2341 (!) 22     Temp 07/04/16 2341 98.8 F (37.1 C)     Temp Source 07/04/16 2341 Oral     SpO2 07/04/16 2341 98 %     Weight 07/04/16 2342 140 lb (63.5 kg)     Height 07/04/16 2342 _0  (1.626 m)     Head Circumference --      Peak Flow --      Pain Score 07/04/16 2342 0     Pain Loc --      Pain Edu? --      Excl. in [place]? --     Constitutional: Alert and oriented.  Well appearing and in no acute distress. Eyes: Conjunctivae are normal. PERRL. EOMI. Head: Atraumatic. Nose: No congestion/rhinnorhea. Mouth/Throat: Mucous membranes are moist.  Oropharynx non-erythematous. Cardiovascular: Normal rate, regular rhythm. Grossly normal heart sounds.  Good peripheral circulation. Respiratory: Normal respiratory effort.  No retractions. With some expiratory wheezing Gastrointestinal: Soft and nontender. No distention. Positive bowel sounds Musculoskeletal: No lower extremity tenderness nor edema.   Neurologic:  Normal speech and language.  Skin:  Skin is warm, dry and intact. No rash noted. Psychiatric: Mood and affect are normal.  ____________________________________________   LABS (all labs ordered are listed, but only abnormal results are displayed)  Labs Reviewed  BASIC METABOLIC PANEL - Abnormal; Notable for the following:       Result Value   Potassium 3.4 (*)    Glucose, Bld 159 (*)    All other components within normal limits  CBC   ____________________________________________  EKG  none ____________________________________________  RADIOLOGY  CXR ____________________________________________   PROCEDURES  Procedure(s) performed:  None  Procedures  Critical Care performed: No  ____________________________________________   INITIAL IMPRESSION / ASSESSMENT AND PLAN / ED COURSE  Pertinent labs & imaging results that were available during my care of the patient were reviewed by me and considered in my medical decision making (see chart for details).  This is a 70 year old female who comes into the hospital today with a cough that is causing her fall short of breath. The patient has a history of COPD and does have some mild wheezing. I'll give the patient a dose of prednisone as well as some DuoNeb's. I will reassess the patient when she's received medications. The patient's blood work is unremarkable.  Clinical Course  Value Comment By Time  DG Chest 2 View No acute cardiopulmonary process seen. Loney Hering, MD 10/18 0424   The patient's chest x-ray does not show a pneumonia. After the DuoNeb's and the prednisone the patient reports that she does feel improved. She'll be discharged home to follow-up with her primary care physician.  ____________________________________________   FINAL CLINICAL IMPRESSION(S) / ED DIAGNOSES  Final diagnoses:  Cough  Bronchitis      NEW MEDICATIONS STARTED DURING THIS VISIT:  Discharge Medication List as of 07/05/2016  5:39 AM    START taking these medications   Details  albuterol (PROVENTIL HFA;VENTOLIN HFA) 108 (90 Base) MCG/ACT inhaler Inhale 2 puffs into the lungs every 6 (six) hours as needed for wheezing or shortness of breath., Starting Wed 07/05/2016, Print    predniSONE (DELTASONE) 20 MG tablet Take 3 tablets (60 mg total) by mouth daily., Starting Wed 07/05/2016, Print         Note:  This document was prepared using Dragon voice recognition software and may include unintentional dictation errors.    Loney Hering, MD 07/05/16 [phone]

## 2016-07-26 ENCOUNTER — Encounter: Payer: Self-pay | Admitting: Family Medicine

## 2016-07-26 ENCOUNTER — Ambulatory Visit (INDEPENDENT_AMBULATORY_CARE_PROVIDER_SITE_OTHER): Payer: Commercial Managed Care - HMO | Admitting: Family Medicine

## 2016-07-26 VITALS — BP 136/68 | HR 86 | Temp 97.5°F | Resp 18 | Ht 64.0 in | Wt 147.7 lb

## 2016-07-26 DIAGNOSIS — E441 Mild protein-calorie malnutrition: Secondary | ICD-10-CM | POA: Diagnosis not present

## 2016-07-26 DIAGNOSIS — Z1211 Encounter for screening for malignant neoplasm of colon: Secondary | ICD-10-CM

## 2016-07-26 DIAGNOSIS — Z1231 Encounter for screening mammogram for malignant neoplasm of breast: Secondary | ICD-10-CM

## 2016-07-26 DIAGNOSIS — E1142 Type 2 diabetes mellitus with diabetic polyneuropathy: Secondary | ICD-10-CM

## 2016-07-26 DIAGNOSIS — E559 Vitamin D deficiency, unspecified: Secondary | ICD-10-CM | POA: Diagnosis not present

## 2016-07-26 DIAGNOSIS — M62838 Other muscle spasm: Secondary | ICD-10-CM

## 2016-07-26 DIAGNOSIS — E785 Hyperlipidemia, unspecified: Secondary | ICD-10-CM | POA: Diagnosis not present

## 2016-07-26 DIAGNOSIS — R6881 Early satiety: Secondary | ICD-10-CM

## 2016-07-26 DIAGNOSIS — E538 Deficiency of other specified B group vitamins: Secondary | ICD-10-CM | POA: Diagnosis not present

## 2016-07-26 DIAGNOSIS — F331 Major depressive disorder, recurrent, moderate: Secondary | ICD-10-CM

## 2016-07-26 DIAGNOSIS — M25572 Pain in left ankle and joints of left foot: Secondary | ICD-10-CM | POA: Diagnosis not present

## 2016-07-26 DIAGNOSIS — G629 Polyneuropathy, unspecified: Secondary | ICD-10-CM

## 2016-07-26 DIAGNOSIS — J449 Chronic obstructive pulmonary disease, unspecified: Secondary | ICD-10-CM

## 2016-07-26 DIAGNOSIS — I69354 Hemiplegia and hemiparesis following cerebral infarction affecting left non-dominant side: Secondary | ICD-10-CM | POA: Diagnosis not present

## 2016-07-26 DIAGNOSIS — Z23 Encounter for immunization: Secondary | ICD-10-CM | POA: Diagnosis not present

## 2016-07-26 LAB — COMPLETE METABOLIC PANEL WITH GFR
ALT: 14 U/L (ref 6–29)
AST: 13 U/L (ref 10–35)
Albumin: 4.1 g/dL (ref 3.6–5.1)
Alkaline Phosphatase: 65 U/L (ref 33–130)
BUN: 10 mg/dL (ref 7–25)
CO2: 27 mmol/L (ref 20–31)
Calcium: 9.6 mg/dL (ref 8.6–10.4)
Chloride: 108 mmol/L (ref 98–110)
Creat: 0.75 mg/dL (ref 0.50–0.99)
GFR, Est African American: >89 mL/min (ref >=60)
GFR, Est Non African American: 82 mL/min (ref >=60)
Glucose, Bld: 90 mg/dL (ref 65–99)
Potassium: 5.2 mmol/L (ref 3.5–5.3)
Sodium: 141 mmol/L (ref 135–146)
Total Bilirubin: 0.4 mg/dL (ref 0.2–1.2)
Total Protein: 6.6 g/dL (ref 6.1–8.1)

## 2016-07-26 LAB — LIPID PANEL
Cholesterol: 103 mg/dL (ref <200)
HDL: 52 mg/dL (ref >50)
LDL Cholesterol: 35 mg/dL
Total CHOL/HDL Ratio: 2 Ratio (ref <5.0)
Triglycerides: 78 mg/dL (ref <150)
VLDL: 16 mg/dL (ref <30)

## 2016-07-26 LAB — HEMOGLOBIN A1C
Hgb A1c MFr Bld: 6.4 % — ABNORMAL HIGH (ref <5.7)
Mean Plasma Glucose: 137 mg/dL

## 2016-07-26 LAB — URIC ACID: Uric Acid, Serum: 4.3 mg/dL (ref 2.5–7.0)

## 2016-07-26 MED ORDER — MELOXICAM 15 MG PO TABS: 15.0000 mg | ORAL_TABLET | Freq: Every day | ORAL | 0 refills | Status: DC

## 2016-07-26 MED ORDER — CITALOPRAM HYDROBROMIDE 20 MG PO TABS: 20.0000 mg | ORAL_TABLET | Freq: Every day | ORAL | 1 refills | Status: DC

## 2016-07-26 MED ORDER — GABAPENTIN 300 MG PO CAPS: 300.0000 mg | ORAL_CAPSULE | Freq: Three times a day (TID) | ORAL | 1 refills | Status: DC

## 2016-07-26 MED ORDER — INSULIN GLARGINE 100 UNIT/ML SOLOSTAR PEN: 22.0000 [IU] | PEN_INJECTOR | Freq: Every day | SUBCUTANEOUS | 1 refills | Status: DC

## 2016-07-26 MED ORDER — FLUTICASONE FUROATE-VILANTEROL 100-25 MCG/INH IN AEPB: 1.0000 | INHALATION_SPRAY | Freq: Every day | RESPIRATORY_TRACT | 3 refills | Status: DC

## 2016-07-26 MED ORDER — TIZANIDINE HCL 4 MG PO TABS: 4.0000 mg | ORAL_TABLET | Freq: Every day | ORAL | 1 refills | Status: DC

## 2016-07-26 NOTE — Progress Notes (Signed)
Name: Kendra [name]   MRN: [id number]    DOB: December 12, 1945   Date:07/26/2016       Progress Note  Subjective  Chief Complaint  Chief Complaint  Patient presents with  . Hospitalization Follow-up    Patient went in on 07/05/16 for SOB, wheezing and was diagnosed with URI. Given Tessalon perles, Duoneb and Prednisone. Symptoms have improved.     HPI   DMII only on Lantus now she stopped aking Janumet because of diarrhea, denies hypoglycemia and glucose fasting has been in the 120's. HgbA1C is at goal at 6.5%. She has intermittent episodes of  polyphagia, polydipsia but no polyuria. She has neuropathy and symptoms have improved with gabapentin, no longer has burning sensation .  COPD: she is still smoking but down to 2 cigarettes per day . She recently went to [name] with COPD exacerbation, she was given Ventolin and prednisone, she states she is feeling better, back to baseline  daily cough, and wheezing but no SOB with activity. She does not like Spiriva, and we will try Breo  Right arm and bilateral tenosynovitis: seen  Ortho was given steroid injection and advised to have surgery but has not scheduled it yet.. She states difficulty cooking and cleaning her house,and even opening medication bottles. She also has swelling of left ankle and pain over the past month. Pain with pressure, no redness or increase in warmth, no trauma.  CVA history : she still has some weakness on left leg that causes her lose her balance at times, taking Aggrenox and Atorvastatin daily , she has muscle spasms and discussed adding CoQ 10 otc  Depression Moderate Recurrent: she feels sad all the time, denies suicidal thought or ideation, lack of appetite and she has anhedonia. She has been off Celexa and would like a refill  Malnutrition: she has noticed some early satiety lately, eats small amounts, we will refer to GI  Patient Active Problem List   Diagnosis Date Noted  . Early satiety 07/26/2016  . CMC  arthritis, thumb, degenerative 07/16/2015  . Weight loss 06/07/2015  . Tenosynovitis of wrist 06/07/2015  . Protein calorie malnutrition ([place]) 06/07/2015  . Cataract 03/30/2015  . Other and unspecified disc disorder of cervical region 03/30/2015  . Insomnia 03/30/2015  . COPD, moderate ([place]) 03/30/2015  . Clinical depression 03/30/2015  . Dyslipidemia 03/30/2015  . Gastro-esophageal reflux disease without esophagitis 03/30/2015  . Increased thickness of nail 03/30/2015  . History of cerebrovascular accident with residual deficit 03/30/2015  . Neuropathy ([place]) 03/30/2015  . Osteoarthritis 03/30/2015  . Osteopenia 03/30/2015  . Allergic rhinitis 03/30/2015  . B12 deficiency 03/30/2015  . Tobacco use disorder 03/30/2015  . Vitamin D deficiency 03/30/2015  . Closed fracture of one or more phalanges of foot 08/12/2014  . Diabetic peripheral neuropathy associated with type 2 diabetes mellitus ([place]) 08/12/2014    Past Surgical History:  Procedure Laterality Date  . BREAST BIOPSY    . NECK SURGERY    . OVARIAN CYST REMOVAL      No family history on file.  Social History   Social History  . Marital status: Divorced    Spouse name: N/A  . Number of children: N/A  . Years of education: N/A   Occupational History  . Not on file.   Social History Main Topics  . Smoking status: Current Some Day Smoker    Packs/day: 1.00    Types: Cigarettes  . Smokeless tobacco: Never Used  . Alcohol use No  .  Drug use: No  . Sexual activity: Not on file   Other Topics Concern  . Not on file   Social History Narrative  . No narrative on file     Current Outpatient Prescriptions:  .  ACCU-CHEK AVIVA PLUS test strip, USE  TO  TEST THREE TIMES DAILY AS DIRECTED, Disp: 300 each, Rfl: 1 .  ACCU-CHEK SOFTCLIX LANCETS lancets, TEST 3 TIMES DAILY AS INSTRUCTED, Disp: 300 each, Rfl: 1 .  acetaminophen (TYLENOL) 500 MG tablet, Take 1 tablet (500 mg total) by mouth every 8 (eight) hours as  needed., Disp: 270 tablet, Rfl: 1 .  albuterol (PROVENTIL HFA;VENTOLIN HFA) 108 (90 Base) MCG/ACT inhaler, Inhale 2 puffs into the lungs every 6 (six) hours as needed for wheezing or shortness of breath., Disp: 1 Inhaler, Rfl: 0 .  atorvastatin (LIPITOR) 40 MG tablet, Take 1 tablet (40 mg total) by mouth daily. In place of Pravastatin, Disp: 90 tablet, Rfl: 1 .  Blood Glucose Monitoring Suppl (ACCU-CHEK AVIVA PLUS) w/Device KIT, CHECK BLOOD SUGAR FOUR TIMES DAILY, Disp: 1 kit, Rfl: 0 .  citalopram (CELEXA) 20 MG tablet, Take 1 tablet (20 mg total) by mouth daily., Disp: 90 tablet, Rfl: 1 .  dipyridamole-aspirin (AGGRENOX) 200-25 MG 12hr capsule, Take 1 capsule by mouth 2 (two) times daily., Disp: 180 capsule, Rfl: 1 .  gabapentin (NEURONTIN) 300 MG capsule, Take 1 capsule (300 mg total) by mouth 3 (three) times daily., Disp: 270 capsule, Rfl: 1 .  Insulin Glargine (LANTUS SOLOSTAR) 100 UNIT/ML Solostar Pen, Inject 22 Units into the skin daily at 10 pm., Disp: 15 pen, Rfl: 1 .  Insulin Pen Needle (BD PEN NEEDLE NANO U/F) 32G X 4 MM MISC, use WITH LANTUS, Disp: 100 each, Rfl: 2 .  tiZANidine (ZANAFLEX) 4 MG tablet, Take 1 tablet (4 mg total) by mouth at bedtime., Disp: 90 tablet, Rfl: 1 .  fluticasone furoate-vilanterol (BREO ELLIPTA) 100-25 MCG/INH AEPB, Inhale 1 puff into the lungs daily., Disp: 60 each, Rfl: 3 .  meloxicam (MOBIC) 15 MG tablet, Take 1 tablet (15 mg total) by mouth daily., Disp: 30 tablet, Rfl: 0  No Known Allergies   ROS  Constitutional: Negative for fever, positive for  Weight gain Respiratory: Positive  for cough but denies  shortness of breath.   Cardiovascular: Negative for chest pain or palpitations.  Gastrointestinal: Negative for abdominal pain, no bowel changes.  Musculoskeletal: positive for gait problem or joint swelling.  Skin: Negative for rash.  Neurological: Negative for dizziness or headache.  No other specific complaints in a complete review of systems  (except as listed in HPI above).  Objective  Vitals:   07/26/16 1116  BP: 136/68  Pulse: 86  Resp: 18  Temp: 97.5 F (36.4 C)  TempSrc: Oral  SpO2: 97%  Weight: 147 lb 11.2 oz (67 kg)  Height: '5\' 4"'$  (1.626 m)    Body mass index is 25.35 kg/m.  Physical Exam  Constitutional: Patient appears well-developed. No distress.  HEENT: head atraumatic, normocephalic, pupils equal and reactive to light,  neck supple, throat within normal limits Cardiovascular: Normal rate, regular rhythm and normal heart sounds.  No murmur heard. No BLE edema. Pulmonary/Chest: Effort normal and breath sounds normal. No respiratory distress. Abdominal: Soft.  There is no tenderness. Psychiatric: Patient has a normal mood and affect. behavior is normal. Judgment and thought content normal. Muscular Skeletal: hand deformities, pain during palpation of dorsal foot, no pain with palpation of malleolus, no ecchymosis or erythema Neurological:  failed monofilament test, normal grip and sensation , weaker on left lower extremity   Recent Results (from the past 2160 hour(s))  CBC     Status: None   Collection Time: 07/04/16 11:51 PM  Result Value Ref Range   WBC 7.6 3.6 - 11.0 K/uL   RBC 3.95 3.80 - 5.20 MIL/uL   Hemoglobin 12.6 12.0 - 16.0 g/dL   HCT 36.9 35.0 - 47.0 %   MCV 93.6 80.0 - 100.0 fL   MCH 31.9 26.0 - 34.0 pg   MCHC 34.1 32.0 - 36.0 g/dL   RDW 13.8 11.5 - 14.5 %   Platelets 259 150 - 440 K/uL  Basic metabolic panel     Status: Abnormal   Collection Time: 07/04/16 11:51 PM  Result Value Ref Range   Sodium 140 135 - 145 mmol/L   Potassium 3.4 (L) 3.5 - 5.1 mmol/L   Chloride 105 101 - 111 mmol/L   CO2 29 22 - 32 mmol/L   Glucose, Bld 159 (H) 65 - 99 mg/dL   BUN 17 6 - 20 mg/dL   Creatinine, Ser 0.93 0.44 - 1.00 mg/dL   Calcium 9.5 8.9 - 10.3 mg/dL   GFR calc non Af Amer >60 >60 mL/min   GFR calc Af Amer >60 >60 mL/min    Comment: (NOTE) The eGFR has been calculated using the CKD EPI  equation. This calculation has not been validated in all clinical situations. eGFR's persistently <60 mL/min signify possible Chronic Kidney Disease.    Anion gap 6 5 - 15    Diabetic Foot Exam: Diabetic Foot Exam - Simple   Simple Foot Form Diabetic Foot exam was performed with the following findings:  Yes 07/26/2016 11:57 AM  Visual Inspection See comments:  Yes Sensation Testing See comments:  Yes Pulse Check Posterior Tibialis and Dorsalis pulse intact bilaterally:  Yes Comments Failed monofilament, thick toenails     PHQ2/9: Depression screen [facility] 2/9 07/26/2016 01/18/2016 09/29/2015 07/12/2015 06/07/2015  Decreased Interest 3 0 0 0 0  Down, Depressed, Hopeless 3 0 0 0 1  PHQ - 2 Score 6 0 0 0 1  Altered sleeping 3 - - - -  Tired, decreased energy 3 - - - -  Change in appetite 3 - - - -  Feeling bad or failure about yourself  0 - - - -  Trouble concentrating 0 - - - -  Moving slowly or fidgety/restless 2 - - - -  Suicidal thoughts 0 - - - -  PHQ-9 Score 17 - - - -  Difficult doing work/chores Somewhat difficult - - - -     Fall Risk: Fall Risk  07/26/2016 01/18/2016 09/29/2015 07/12/2015 06/07/2015  Falls in the past year? Yes Yes Yes Yes No  Number falls in past yr: 1 2 or more 1 1 -  Injury with Fall? Yes Yes Yes No -  Follow up - - - Falls prevention discussed -      Functional Status Survey: Is the patient deaf or have difficulty hearing?: No Does the patient have difficulty seeing, even when wearing glasses/contacts?: No Does the patient have difficulty concentrating, remembering, or making decisions?: No Does the patient have difficulty walking or climbing stairs?: No Does the patient have difficulty dressing or bathing?: No Does the patient have difficulty doing errands alone such as visiting a doctor's office or shopping?: No    Assessment & Plan  1. Diabetic peripheral neuropathy associated with type 2 diabetes mellitus ([place])  -  Hemoglobin A1c -  Insulin Glargine (LANTUS SOLOSTAR) 100 UNIT/ML Solostar Pen; Inject 22 Units into the skin daily at 10 pm.  Dispense: 15 pen; Refill: 1 - gabapentin (NEURONTIN) 300 MG capsule; Take 1 capsule (300 mg total) by mouth 3 (three) times daily.  Dispense: 270 capsule; Refill: 1 - Ambulatory referral to Ophthalmology  2. Needs flu shot  - Flu vaccine HIGH DOSE PF (Fluzone High dose)  3. Hemiparesis affecting left side as late effect of cerebrovascular accident (CVA) ([place])  Continue medication   4. COPD, moderate (HCC)  - fluticasone furoate-vilanterol (BREO ELLIPTA) 100-25 MCG/INH AEPB; Inhale 1 puff into the lungs daily.  Dispense: 60 each; Refill: 3  5. Dyslipidemia  - Lipid panel  6. Vitamin D deficiency  - VITAMIN D 25 Hydroxy (Vit-D Deficiency, Fractures)  7. Mild protein-calorie malnutrition (HCC)  - COMPLETE METABOLIC PANEL WITH GFR  8. Muscle spasm  - tiZANidine (ZANAFLEX) 4 MG tablet; Take 1 tablet (4 mg total) by mouth at bedtime.  Dispense: 90 tablet; Refill: 1  9. Neuropathy (HCC)  - gabapentin (NEURONTIN) 300 MG capsule; Take 1 capsule (300 mg total) by mouth 3 (three) times daily.  Dispense: 270 capsule; Refill: 1  10. Acute left ankle pain  - Uric acid - meloxicam (MOBIC) 15 MG tablet; Take 1 tablet (15 mg total) by mouth daily.  Dispense: 30 tablet; Refill: 0  11. B12 deficiency  -B12  12. Moderate episode of recurrent major depressive disorder (HCC)  - citalopram (CELEXA) 20 MG tablet; Take 1 tablet (20 mg total) by mouth daily.  Dispense: 90 tablet; Refill: 1  13. Encounter for screening mammogram for breast cancer   - MM Digital Screening; Future  14. Colon cancer screening  - Ambulatory referral to Gastroenterology   15. Early satiety  - see GI, it may be diabetic gastroparesis

## 2016-07-27 ENCOUNTER — Other Ambulatory Visit: Payer: Self-pay | Admitting: Family Medicine

## 2016-07-27 LAB — VITAMIN B12: Vitamin B-12: 433 pg/mL (ref 200–1100)

## 2016-07-27 LAB — VITAMIN D 25 HYDROXY (VIT D DEFICIENCY, FRACTURES): Vit D, 25-Hydroxy: 14 ng/mL — ABNORMAL LOW (ref 30–100)

## 2016-07-27 NOTE — Telephone Encounter (Signed)
Patient requesting refill of Combivent to [facility].

## 2016-08-02 ENCOUNTER — Other Ambulatory Visit: Payer: Self-pay | Admitting: Family Medicine

## 2016-08-02 DIAGNOSIS — E1142 Type 2 diabetes mellitus with diabetic polyneuropathy: Secondary | ICD-10-CM

## 2016-08-02 NOTE — Telephone Encounter (Signed)
Patient requesting refill of BD Pen Needle.

## 2016-08-03 ENCOUNTER — Encounter: Payer: Self-pay | Admitting: *Deleted

## 2016-08-09 ENCOUNTER — Other Ambulatory Visit: Payer: Self-pay

## 2016-08-09 DIAGNOSIS — M62838 Other muscle spasm: Secondary | ICD-10-CM

## 2016-08-09 NOTE — Telephone Encounter (Signed)
Patient requesting refill of test strips to [facility].

## 2016-08-09 NOTE — Telephone Encounter (Signed)
Kendra [name] from [facility] called stating Insurance will only fill a 90 day supply at a time for patient Kendra [name]. They do not perfer the 30 day supplies. Patient just picked up 30 day supply on 08/07/16. But when it comes to her pick up please change to 90 day supply and send to pharmacy, so they will have it on file. Thanks

## 2016-08-10 MED ORDER — TIZANIDINE HCL 4 MG PO TABS: 4.0000 mg | ORAL_TABLET | Freq: Every day | ORAL | 0 refills | Status: DC

## 2016-08-10 MED ORDER — GLUCOSE BLOOD VI STRP: ORAL_STRIP | 1 refills | Status: DC

## 2016-08-15 ENCOUNTER — Encounter: Payer: Self-pay | Admitting: *Deleted

## 2016-08-17 ENCOUNTER — Ambulatory Visit: Payer: Self-pay | Admitting: General Surgery

## 2016-08-18 ENCOUNTER — Other Ambulatory Visit: Payer: Self-pay | Admitting: Family Medicine

## 2016-08-21 ENCOUNTER — Other Ambulatory Visit: Payer: Self-pay | Admitting: Family Medicine

## 2016-08-21 DIAGNOSIS — M25572 Pain in left ankle and joints of left foot: Secondary | ICD-10-CM

## 2016-09-05 ENCOUNTER — Ambulatory Visit: Payer: Self-pay | Admitting: General Surgery

## 2016-09-06 ENCOUNTER — Ambulatory Visit: Payer: Commercial Managed Care - HMO | Attending: Family Medicine

## 2016-09-07 ENCOUNTER — Other Ambulatory Visit: Payer: Self-pay | Admitting: Family Medicine

## 2016-09-07 ENCOUNTER — Encounter: Payer: Self-pay | Admitting: *Deleted

## 2016-09-07 DIAGNOSIS — M25572 Pain in left ankle and joints of left foot: Secondary | ICD-10-CM

## 2016-09-07 NOTE — Telephone Encounter (Signed)
Pt requesting refill on meloxicam (for swelling in left ankle). Patient is completely out. Please send to [facility]

## 2016-09-08 MED ORDER — MELOXICAM 15 MG PO TABS: 15.0000 mg | ORAL_TABLET | Freq: Every day | ORAL | 0 refills | Status: DC

## 2016-09-15 ENCOUNTER — Other Ambulatory Visit: Payer: Self-pay | Admitting: Family Medicine

## 2016-09-15 DIAGNOSIS — E559 Vitamin D deficiency, unspecified: Secondary | ICD-10-CM

## 2016-09-15 MED ORDER — VITAMIN D (ERGOCALCIFEROL) 1.25 MG (50000 UNIT) PO CAPS: 50000.0000 [IU] | ORAL_CAPSULE | ORAL | 0 refills | Status: DC

## 2016-09-18 DIAGNOSIS — J189 Pneumonia, unspecified organism: Secondary | ICD-10-CM

## 2016-09-18 HISTORY — DX: Pneumonia, unspecified organism: J18.9

## 2016-09-20 ENCOUNTER — Ambulatory Visit: Payer: Self-pay | Admitting: General Surgery

## 2016-09-26 ENCOUNTER — Encounter: Payer: Self-pay | Admitting: *Deleted

## 2016-09-26 DIAGNOSIS — R05 Cough: Secondary | ICD-10-CM | POA: Diagnosis not present

## 2016-09-26 DIAGNOSIS — R110 Nausea: Secondary | ICD-10-CM | POA: Diagnosis not present

## 2016-09-27 ENCOUNTER — Encounter: Payer: Self-pay | Admitting: *Deleted

## 2016-09-27 ENCOUNTER — Emergency Department
Admission: EM | Admit: 2016-09-27 | Discharge: 2016-09-27 | Disposition: A | Discharge status: Home / Self Care | Payer: Commercial Managed Care - HMO | Attending: Emergency Medicine | Admitting: Emergency Medicine

## 2016-09-27 ENCOUNTER — Encounter: Payer: Self-pay | Admitting: Family Medicine

## 2016-09-27 ENCOUNTER — Emergency Department: Payer: Commercial Managed Care - HMO

## 2016-09-27 DIAGNOSIS — R197 Diarrhea, unspecified: Secondary | ICD-10-CM | POA: Diagnosis not present

## 2016-09-27 DIAGNOSIS — R109 Unspecified abdominal pain: Secondary | ICD-10-CM | POA: Insufficient documentation

## 2016-09-27 DIAGNOSIS — F17210 Nicotine dependence, cigarettes, uncomplicated: Secondary | ICD-10-CM | POA: Diagnosis not present

## 2016-09-27 DIAGNOSIS — Z79899 Other long term (current) drug therapy: Secondary | ICD-10-CM | POA: Insufficient documentation

## 2016-09-27 DIAGNOSIS — I700 Atherosclerosis of aorta: Secondary | ICD-10-CM | POA: Insufficient documentation

## 2016-09-27 DIAGNOSIS — Z794 Long term (current) use of insulin: Secondary | ICD-10-CM | POA: Diagnosis not present

## 2016-09-27 DIAGNOSIS — J449 Chronic obstructive pulmonary disease, unspecified: Secondary | ICD-10-CM | POA: Insufficient documentation

## 2016-09-27 DIAGNOSIS — Z5321 Procedure and treatment not carried out due to patient leaving prior to being seen by health care provider: Secondary | ICD-10-CM | POA: Diagnosis not present

## 2016-09-27 DIAGNOSIS — R1110 Vomiting, unspecified: Secondary | ICD-10-CM | POA: Insufficient documentation

## 2016-09-27 DIAGNOSIS — E119 Type 2 diabetes mellitus without complications: Secondary | ICD-10-CM | POA: Diagnosis not present

## 2016-09-27 DIAGNOSIS — R05 Cough: Secondary | ICD-10-CM | POA: Diagnosis not present

## 2016-09-27 LAB — CBC
HCT: 40.1 % (ref 35.0–47.0)
Hemoglobin: 14 g/dL (ref 12.0–16.0)
MCH: 32 pg (ref 26.0–34.0)
MCHC: 34.9 g/dL (ref 32.0–36.0)
MCV: 91.8 fL (ref 80.0–100.0)
Platelets: 194 10*3/uL (ref 150–440)
RBC: 4.37 MIL/uL (ref 3.80–5.20)
RDW: 13.3 % (ref 11.5–14.5)
WBC: 6 10*3/uL (ref 3.6–11.0)

## 2016-09-27 LAB — BASIC METABOLIC PANEL
Anion gap: 8 (ref 5–15)
BUN: 21 mg/dL — ABNORMAL HIGH (ref 6–20)
CO2: 25 mmol/L (ref 22–32)
Calcium: 9 mg/dL (ref 8.9–10.3)
Chloride: 103 mmol/L (ref 101–111)
Creatinine, Ser: 0.84 mg/dL (ref 0.44–1.00)
GFR calc Af Amer: >60 mL/min (ref >60)
GFR calc non Af Amer: >60 mL/min (ref >60)
Glucose, Bld: 127 mg/dL — ABNORMAL HIGH (ref 65–99)
Potassium: 3.4 mmol/L — ABNORMAL LOW (ref 3.5–5.1)
Sodium: 136 mmol/L (ref 135–145)

## 2016-09-27 LAB — TROPONIN I: Troponin I: <0.03 ng/mL (ref <0.03)

## 2016-09-27 NOTE — ED Notes (Signed)
Pt noted leaving ED lobby with family 

## 2016-09-27 NOTE — ED Notes (Signed)
No answer when called from lobby 

## 2016-09-27 NOTE — ED Triage Notes (Addendum)
Pt brought in via ems from home with vomiting and diarrhea.  Pt reports abd cramping.  Pt states vomiting x 1 today .  Diarrhea x 3.  Pt also has sob. No chest pain.  Pt coughing in triage.  Hx copd  cig smoker.  Pt alert.

## 2016-09-28 ENCOUNTER — Telehealth: Payer: Self-pay | Admitting: Emergency Medicine

## 2016-09-28 NOTE — Telephone Encounter (Signed)
Called patient due to lwot to inquire about condition and follow up plans. Patient says she still feels bad.  She is planning to go to pcp on Monday.  I told her that her labs and xray really need to be reviewed by her doctor.  She had said her doctor may not be in today, but I asked her to call them anyway to see if another provider can review these.  She agrees and will call now.

## 2016-09-29 ENCOUNTER — Ambulatory Visit (INDEPENDENT_AMBULATORY_CARE_PROVIDER_SITE_OTHER): Payer: Medicare HMO | Admitting: Family Medicine

## 2016-09-29 ENCOUNTER — Encounter: Payer: Self-pay | Admitting: Family Medicine

## 2016-09-29 VITALS — BP 106/68 | HR 86 | Temp 98.1°F | Resp 16 | Ht 64.0 in | Wt 143.8 lb

## 2016-09-29 DIAGNOSIS — T2115XA Burn of first degree of buttock, initial encounter: Secondary | ICD-10-CM | POA: Diagnosis not present

## 2016-09-29 DIAGNOSIS — J181 Lobar pneumonia, unspecified organism: Secondary | ICD-10-CM

## 2016-09-29 DIAGNOSIS — J449 Chronic obstructive pulmonary disease, unspecified: Secondary | ICD-10-CM

## 2016-09-29 DIAGNOSIS — J189 Pneumonia, unspecified organism: Secondary | ICD-10-CM

## 2016-09-29 MED ORDER — FLUTICASONE FUROATE-VILANTEROL 100-25 MCG/INH IN AEPB: 1.0000 | INHALATION_SPRAY | Freq: Every day | RESPIRATORY_TRACT | 3 refills | Status: DC

## 2016-09-29 MED ORDER — MUCINEX DM 30-600 MG PO TB12: 1.0000 | ORAL_TABLET | Freq: Two times a day (BID) | ORAL | 0 refills | Status: DC

## 2016-09-29 MED ORDER — LEVOFLOXACIN 500 MG PO TABS: 500.0000 mg | ORAL_TABLET | Freq: Every day | ORAL | 0 refills | Status: DC

## 2016-09-29 NOTE — Patient Instructions (Signed)
PLEASE DO NOT TAKE CELEXA ( CITALOPRAM ) WHILE TAKING LEVAQUIN FOR PNEUMONIA
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
## 2016-10-04 ENCOUNTER — Ambulatory Visit: Payer: Medicare HMO | Admitting: Family Medicine

## 2016-10-11 ENCOUNTER — Other Ambulatory Visit: Payer: Self-pay | Admitting: Family Medicine

## 2016-10-11 DIAGNOSIS — J449 Chronic obstructive pulmonary disease, unspecified: Secondary | ICD-10-CM

## 2016-10-11 MED ORDER — FLUTICASONE FUROATE-VILANTEROL 100-25 MCG/INH IN AEPB: 1.0000 | INHALATION_SPRAY | Freq: Every day | RESPIRATORY_TRACT | 3 refills | Status: DC

## 2016-10-11 NOTE — Telephone Encounter (Signed)
PT NEEDS REFILL ON HER BREO IN [place]. [place]

## 2016-10-12 ENCOUNTER — Ambulatory Visit: Payer: Medicare HMO | Admitting: Family Medicine

## 2016-10-17 ENCOUNTER — Telehealth: Payer: Self-pay | Admitting: Family Medicine

## 2016-10-17 NOTE — Telephone Encounter (Signed)
Patient stated that she can't afford the [facility] inhaler.  Would like to know if there is an another inhaler she can use.  Please advise.

## 2016-10-18 ENCOUNTER — Other Ambulatory Visit: Payer: Self-pay | Admitting: Family Medicine

## 2016-10-18 DIAGNOSIS — E1142 Type 2 diabetes mellitus with diabetic polyneuropathy: Secondary | ICD-10-CM

## 2016-10-18 MED ORDER — FLUTICASONE-SALMETEROL 250-50 MCG/DOSE IN AEPB: 1.0000 | INHALATION_SPRAY | Freq: Two times a day (BID) | RESPIRATORY_TRACT | 2 refills | Status: DC

## 2016-10-23 ENCOUNTER — Other Ambulatory Visit: Payer: Self-pay | Admitting: Family Medicine

## 2016-11-10 ENCOUNTER — Other Ambulatory Visit: Payer: Self-pay | Admitting: Family Medicine

## 2016-11-10 DIAGNOSIS — J189 Pneumonia, unspecified organism: Secondary | ICD-10-CM

## 2016-11-10 DIAGNOSIS — J181 Lobar pneumonia, unspecified organism: Principal | ICD-10-CM

## 2016-11-10 NOTE — Telephone Encounter (Signed)
Pt states she needs a refill on Levofloxacin. [address]

## 2016-11-24 ENCOUNTER — Ambulatory Visit: Payer: Commercial Managed Care - HMO | Admitting: Family Medicine

## 2016-12-25 ENCOUNTER — Telehealth: Payer: Self-pay | Admitting: Family Medicine

## 2016-12-25 DIAGNOSIS — M62838 Other muscle spasm: Secondary | ICD-10-CM

## 2016-12-25 DIAGNOSIS — F331 Major depressive disorder, recurrent, moderate: Secondary | ICD-10-CM

## 2016-12-25 DIAGNOSIS — E1142 Type 2 diabetes mellitus with diabetic polyneuropathy: Secondary | ICD-10-CM

## 2016-12-28 NOTE — Telephone Encounter (Signed)
Pt informed and appt made for 03-05-17

## 2017-01-11 ENCOUNTER — Ambulatory Visit: Payer: Medicare HMO | Admitting: Nurse Practitioner

## 2017-01-17 ENCOUNTER — Ambulatory Visit: Payer: Commercial Managed Care - HMO | Attending: Family Medicine

## 2017-01-18 ENCOUNTER — Ambulatory Visit: Payer: Medicare HMO | Admitting: Nurse Practitioner

## 2017-02-14 ENCOUNTER — Ambulatory Visit: Payer: Medicare HMO | Admitting: Nurse Practitioner

## 2017-02-19 ENCOUNTER — Ambulatory Visit: Payer: Medicare HMO | Admitting: Nurse Practitioner

## 2017-03-05 ENCOUNTER — Ambulatory Visit: Payer: Medicare HMO | Admitting: Family Medicine

## 2017-03-05 ENCOUNTER — Ambulatory Visit: Payer: Medicare HMO

## 2017-03-07 ENCOUNTER — Ambulatory Visit: Payer: Medicare HMO | Admitting: Nurse Practitioner

## 2017-03-30 ENCOUNTER — Ambulatory Visit (INDEPENDENT_AMBULATORY_CARE_PROVIDER_SITE_OTHER): Payer: Medicare Other | Admitting: Nurse Practitioner

## 2017-03-30 ENCOUNTER — Encounter: Payer: Self-pay | Admitting: Nurse Practitioner

## 2017-03-30 VITALS — BP 135/77 | HR 82 | Temp 97.5°F | Ht 64.0 in | Wt 150.2 lb

## 2017-03-30 DIAGNOSIS — J449 Chronic obstructive pulmonary disease, unspecified: Secondary | ICD-10-CM

## 2017-03-30 DIAGNOSIS — Z1211 Encounter for screening for malignant neoplasm of colon: Secondary | ICD-10-CM

## 2017-03-30 DIAGNOSIS — E785 Hyperlipidemia, unspecified: Secondary | ICD-10-CM | POA: Diagnosis not present

## 2017-03-30 DIAGNOSIS — M19041 Primary osteoarthritis, right hand: Secondary | ICD-10-CM

## 2017-03-30 DIAGNOSIS — M19042 Primary osteoarthritis, left hand: Secondary | ICD-10-CM | POA: Diagnosis not present

## 2017-03-30 DIAGNOSIS — Z7689 Persons encountering health services in other specified circumstances: Secondary | ICD-10-CM | POA: Diagnosis not present

## 2017-03-30 DIAGNOSIS — E1142 Type 2 diabetes mellitus with diabetic polyneuropathy: Secondary | ICD-10-CM

## 2017-03-30 DIAGNOSIS — E559 Vitamin D deficiency, unspecified: Secondary | ICD-10-CM | POA: Diagnosis not present

## 2017-03-30 MED ORDER — BUDESONIDE-FORMOTEROL FUMARATE 160-4.5 MCG/ACT IN AERO: 2.0000 | INHALATION_SPRAY | Freq: Two times a day (BID) | RESPIRATORY_TRACT | 3 refills | Status: DC

## 2017-03-30 MED ORDER — ATORVASTATIN CALCIUM 40 MG PO TABS: 40.0000 mg | ORAL_TABLET | Freq: Every day | ORAL | 1 refills | Status: DC

## 2017-03-30 MED ORDER — BUDESONIDE-FORMOTEROL FUMARATE 160-4.5 MCG/ACT IN AERO: 2.0000 | INHALATION_SPRAY | Freq: Two times a day (BID) | RESPIRATORY_TRACT | 0 refills | Status: DC

## 2017-03-30 MED ORDER — GABAPENTIN 300 MG PO CAPS: ORAL_CAPSULE | ORAL | 1 refills | Status: DC

## 2017-03-30 MED ORDER — ALBUTEROL SULFATE HFA 108 (90 BASE) MCG/ACT IN AERS: 2.0000 | INHALATION_SPRAY | Freq: Four times a day (QID) | RESPIRATORY_TRACT | 0 refills | Status: DC | PRN

## 2017-03-30 MED ORDER — ALBUTEROL SULFATE HFA 108 (90 BASE) MCG/ACT IN AERS: 2.0000 | INHALATION_SPRAY | Freq: Four times a day (QID) | RESPIRATORY_TRACT | 5 refills | Status: DC | PRN

## 2017-03-30 MED ORDER — MEDERMA EX GEL: 1.0000 "application " | Freq: Every day | CUTANEOUS | 0 refills | Status: DC

## 2017-03-30 NOTE — Progress Notes (Signed)
Subjective:    Patient ID: Kendra [name], female    DOB: 1946/02/03, 71 y.o.   MRN: [id number]  Kendra [name] is a 71 y.o. female presenting on 03/30/2017 for Establish Care (muscle spasm w/ pain in buttock and down bilateral legs since stroke x 1 yr ago )   HPI Establish Care New Provider Pt last seen by PCP Dr. Ancil [name] in January.  Obtain records from [facility].  Pt was unable to continue to be seen there r/t "insurance."  Pulmonology Dr. Bedelia [name] cares for pulmonology needs Is having shortness of breath w/ exertion after about 25 feet.  Is currently out of albuterol inhaler.  Would use about 2x daily if she had it.  Is also not sure of her long acting inhaler frequency or which one she is taking.  Med list shows she is taking advair, but she states she has run out.  Scar on bilateral buttock Heat related injury from heating pad she uses for low back pain in past.   Today: No back pain.  Has buttock spasm and will have shooting pain down back of legs.  Occurs "all day every day." Gabapentin does not help. Tizanidine for spasm and does not help.  Can sleep, but awakens at night w/ spasm.    Past Medical History:  Diagnosis Date  . Back pain   . Cataract   . Cervical disc disorder   . Chest pain   . COPD (chronic obstructive pulmonary disease) ([place])   . Depression   . Diabetes ([place])   . Diabetic neuropathy ([place])   . GERD (gastroesophageal reflux disease)   . Hyperlipidemia   . Insomnia   . Neuropathy   . Osteoarthritis   . Osteopenia   . Reflux   . Rheumatoid arthritis ([place])   . Sleep apnea    no CPAP and no suggestion that she needed one per pt  . Stroke ([place]) 2015   and again 2017  - Weakness in left leg, staggers w/ walking  . Tenosynovitis    Past Surgical History:  Procedure Laterality Date  . BREAST BIOPSY    . NECK SURGERY    . OVARIAN CYST REMOVAL     Social History   Social History  . Marital status: Divorced    Spouse name: N/A  . Number  of children: N/A  . Years of education: N/A   Occupational History  . Not on file.   Social History Main Topics  . Smoking status: Current Some Day Smoker    Packs/day: 0.25    Types: Cigarettes  . Smokeless tobacco: Never Used  . Alcohol use No  . Drug use: No  . Sexual activity: No   Other Topics Concern  . Not on file   Social History Narrative  . No narrative on file   Family History  Problem Relation Age of Onset  . Stroke Father   . Depression Father   . Diabetes Mother   . Hyperlipidemia Mother   . Drug abuse Sister   . Heart murmur Son   . Drug abuse Sister   . Heart murmur Son   . Heart murmur Son    Current Outpatient Prescriptions on File Prior to Visit  Medication Sig  . ACCU-CHEK AVIVA PLUS test strip CHECK BLOOD SUGAR FOUR TIMES DAILY  . ACCU-CHEK SOFTCLIX LANCETS lancets TEST 3 TIMES DAILY AS INSTRUCTED  . acetaminophen (TYLENOL) 500 MG tablet Take 1 tablet (500 mg total) by mouth every  8 (eight) hours as needed.  . BD PEN NEEDLE NANO U/F 32G X 4 MM MISC USE WITH LANTUS AS DIRECTED  . Blood Glucose Monitoring Suppl (ACCU-CHEK AVIVA PLUS) w/Device KIT CHECK BLOOD SUGAR FOUR TIMES DAILY  . citalopram (CELEXA) 20 MG tablet TAKE 1 TABLET EVERY DAY  . LANTUS SOLOSTAR 100 UNIT/ML Solostar Pen INJECT  22 UNITS SUBCUTANEOUSLY EVERY DAY  AT  10PM  . meloxicam (MOBIC) 15 MG tablet Take 1 tablet (15 mg total) by mouth daily.  [name] tiZANidine (ZANAFLEX) 4 MG tablet TAKE 1 TABLET AT BEDTIME   No current facility-administered medications on file prior to visit.     Review of Systems  Constitutional: Negative.   HENT: Negative.   Eyes: Negative.   Respiratory: Positive for cough, shortness of breath and wheezing.   Cardiovascular: Negative.   Gastrointestinal: Negative.   Endocrine: Negative.   Genitourinary: Negative.   Musculoskeletal: Positive for arthralgias, back pain and myalgias.  Skin: Positive for wound.  Allergic/Immunologic: Negative.     Neurological: Negative.   Hematological: Negative.   Psychiatric/Behavioral: Negative.    Per HPI unless specifically indicated above.     Objective:    BP 135/77 (BP Location: Left Arm, Patient Position: Sitting, Cuff Size: Normal)   Pulse 82   Temp (!) 97.5 F (36.4 C) (Oral)   Ht '5\' 4"'$  (1.626 m)   Wt 150 lb 3.2 oz (68.1 kg)   BMI 25.78 kg/m   Wt Readings from Last 3 Encounters:  03/30/17 150 lb 3.2 oz (68.1 kg)  09/29/16 143 lb 12.8 oz (65.2 kg)  09/27/16 147 lb (66.7 kg)    Physical Exam  General - healthy, well-appearing, NAD HEENT - Normocephalic, atraumatic Neck - supple, non-tender, no LAD, no thyromegaly, no carotid bruit Heart - RRR, no murmurs heard Lungs - Clear throughout all lobes on left.  Right lung w/ deeply pitched inspiratory wheezing throughout, no crackles or rhonchi. Normal work of breathing. Abdomen - soft, NTND, no masses, no hepatosplenomegaly, active bowel sounds Musculoskeletal -  Left Hand/Wrist Inspection: Normal appearance, symmetrical, bulky PIP joints of hand, no edema or erythema.    Palpation: tender wrist at carpal bones and base of thumb.  Nontender MCP joints. No distinct anatomical snuff box or scaphoid tenderness. Mild tenderness over APL / EPB tendons radially. ROM: limited active wrist ROM w/ flexion.  Normal AROM ext, ulnar / radial deviation Strength: 5/5 grip, thumb opposition, pincer grasp. 3/5 wrist flex/ext Neurovascular: distally intact Right Hand/Wrist Inspection: Normal appearance, symmetrical, bulky PIP joints of hand, no edema or erythema.    Palpation: significantly tender wrist at carpal bones and base of thumb.  Nontender MCP joints. Distinct anatomical snuff box and scaphoid tenderness. Severe tenderness over APL / EPB tendons radially. ROM: limited active wrist ROM w/ flexion only to 70 degrees, extension to 190 degrees, limited ulnar / radial deviation r/t pain Strength: 4/5 grip; 3/5 thumb opposition, pincer grasp;  1/5 wrist flex/ext Neurovascular: distally intact Lower Extremities - non-tender, no edema, cap refill < 2 seconds, peripheral pulses intact +2 bilaterally Skin - warm, dry, no rashes Neuro - awake, alert, oriented x3, CN II-X intact, intact muscle strength 5/5 right leg and arm & 4/5 left leg and arm, intact distal sensation to light touch, normal coordination, mildly hemiplegic gait Psych - Normal mood and affect, normal behavior    Results for orders placed or performed in visit on 03/30/17  Vitamin D (25 hydroxy)  Result Value Ref Range  Vit D, 25-Hydroxy 17 (L) 30 - 100 ng/mL  Comprehensive metabolic panel  Result Value Ref Range   Sodium 138 135 - 146 mmol/L   Potassium 4.0 3.5 - 5.3 mmol/L   Chloride 103 98 - 110 mmol/L   CO2 22 20 - 31 mmol/L   Glucose, Bld 130 (H) 65 - 99 mg/dL   BUN 10 7 - 25 mg/dL   Creat 0.81 0.50 - 0.99 mg/dL   Total Bilirubin 0.5 0.2 - 1.2 mg/dL   Alkaline Phosphatase 75 33 - 130 U/L   AST 13 10 - 35 U/L   ALT 18 6 - 29 U/L   Total Protein 7.1 6.1 - 8.1 g/dL   Albumin 4.3 3.6 - 5.1 g/dL   Calcium 9.6 8.6 - 10.4 mg/dL      Assessment & Plan:   Problem List Items Addressed This Visit      Respiratory   COPD, moderate (HCC)    Stable, uncontrolled.  Pt w/o recent regular use of inhalers.  Pt needs refill of albuterol and maintenance inhaler.    Plan: 1. START symbicort instead of advair r/t insurance coverage w/ medicare preferred formulary.  Take 2 puffs twice daily. 2. START albuterol 2 puffs up to every 6 hours for shortness of breath. 3. Continue follow up w/ pulmonology.  4. Follow up w/ [facility] as needed.      Relevant Medications   albuterol (PROVENTIL HFA;VENTOLIN HFA) 108 (90 Base) MCG/ACT inhaler   albuterol (PROVENTIL HFA;VENTOLIN HFA) 108 (90 Base) MCG/ACT inhaler   budesonide-formoterol (SYMBICORT) 160-4.5 MCG/ACT inhaler     Endocrine   Diabetic peripheral neuropathy associated with type 2 diabetes mellitus (HCC)    Pt notes  gabapentin dose makes her sleepy.  Does help neuropathy.  Plan:  1. Continue gabapentin 300 mg at bedtime.   2. Continue atorvastatin for ASCVD risk reduction for pt w/ DM. 3. FOllow up w/ DM in about 4 weeks.      Relevant Medications   atorvastatin (LIPITOR) 40 MG tablet   gabapentin (NEURONTIN) 300 MG capsule     Musculoskeletal and Integument   Osteoarthritis - Primary    Pt w/ osteoarthritis of multiple joints.  R wrist w/ tenosynovitis.  Left wrist w/ mild tenosynovitis.  Prior workup for surgery, but no follow through.  Pt desires referral.  Plan: 1. Proceed w/ referral to [name]. 2. Follow up at [facility] as needed.      Relevant Orders   AMB referral to orthopedics     Other   Dyslipidemia    Status unknown.  Pt not fasting today.  Pt taking atorvastatin 40 mg once daily.  Plan: 1. Continue atorvastatin.  Refill provided 2. Encouraged heart healthy, low carb diet. 3. Follow up 4 weeks w/ DM visit.      Relevant Medications   atorvastatin (LIPITOR) 40 MG tablet   Other Relevant Orders   Comprehensive metabolic panel (Completed)   Vitamin D deficiency    Pt w/ prior vitamin D deficiency on ergocalciferol 50,000 IU once weekly supplement.  Pt requests refill today.    Plan: 1. Hold refill until assessment of current vitamin D levels. 2. Vitamin D 25 = 17, repeat prescription strength vitamin D replacement for 12 weeks. 3. Follow up 3 months      Relevant Medications   Vitamin D, Ergocalciferol, (DRISDOL) 50000 units CAPS capsule   Other Relevant Orders   Vitamin D (25 hydroxy) (Completed)    Other Visit Diagnoses  Colon cancer screening     Pt w/ prior colonoscopies that were negative.    Plan: 1. Cologuard order placed.  Kit demonstration provided to pt.   Relevant Orders   Cologuard   Encounter to establish care     Pt needs re-establishment w/ PCP after insurance lapse made her ineligible to continue seeing her former PCP.        Meds ordered this encounter  Medications  . Scar Treatment Products [facility]) GEL    Sig: Apply 1 application topically daily.    Dispense:  1 Tube    Refill:  0    Order Specific Question:   Supervising Provider    Answer:   Olin Hauser [[id number]]  . DISCONTD: budesonide-formoterol (SYMBICORT) 160-4.5 MCG/ACT inhaler    Sig: Inhale 2 puffs into the lungs 2 (two) times daily.    Dispense:  1 Inhaler    Refill:  3    Order Specific Question:   Supervising Provider    Answer:   Olin Hauser [[id number]]  . DISCONTD: albuterol (PROVENTIL HFA;VENTOLIN HFA) 108 (90 Base) MCG/ACT inhaler    Sig: Inhale 2 puffs into the lungs every 6 (six) hours as needed for wheezing or shortness of breath.    Dispense:  1 Inhaler    Refill:  0    Order Specific Question:   Supervising Provider    Answer:   Olin Hauser [[id number]]  . atorvastatin (LIPITOR) 40 MG tablet    Sig: Take 1 tablet (40 mg total) by mouth daily. In place of Pravastatin    Dispense:  90 tablet    Refill:  1    Order Specific Question:   Supervising Provider    Answer:   Olin Hauser [[id number]]  . gabapentin (NEURONTIN) 300 MG capsule    Sig: Take one tablet three times during the day.  Take 2 tablets at bedtime.    Dispense:  270 capsule    Refill:  1    Do not fill until refill request.    Order Specific Question:   Supervising Provider    Answer:   Olin Hauser [[id number]]  . albuterol (PROVENTIL HFA;VENTOLIN HFA) 108 (90 Base) MCG/ACT inhaler    Sig: Inhale 2 puffs into the lungs every 6 (six) hours as needed for wheezing or shortness of breath.    Dispense:  1 Inhaler    Refill:  0    Order Specific Question:   Supervising Provider    Answer:   Olin Hauser [[id number]]  . albuterol (PROVENTIL HFA;VENTOLIN HFA) 108 (90 Base) MCG/ACT inhaler    Sig: Inhale 2 puffs into the lungs every 6 (six) hours as needed for wheezing or shortness of breath.    Dispense:  1 Inhaler     Refill:  5    Order Specific Question:   Supervising Provider    Answer:   Olin Hauser [[id number]]  . budesonide-formoterol (SYMBICORT) 160-4.5 MCG/ACT inhaler    Sig: Inhale 2 puffs into the lungs 2 (two) times daily.    Dispense:  1 Inhaler    Refill:  0    Order Specific Question:   Supervising Provider    Answer:   Olin Hauser [[id number]]  . Vitamin D, Ergocalciferol, (DRISDOL) 50000 units CAPS capsule    Sig: Take 1 capsule (50,000 Units total) by mouth every 7 (seven) days.    Dispense:  12 capsule    Refill:  0    Order  Specific Question:   Supervising Provider    Answer:   Olin Hauser [[id number]]      Follow up plan: Return in about 4 weeks (around 04/27/2017) for diabetes.  [name], DNP, AGPCNP-BC Adult Gerontology Primary Care Nurse Practitioner [facility] 04/01/2017, 11:51 PM

## 2017-03-30 NOTE — Patient Instructions (Addendum)
Kendra [name], Thank you for coming in to clinic today.  1. Please return for your diabetes management.  2. For your leg pain: - Take gabapentin 300 mg tablets up to three times per day and 2 tablets at night.  3. For your COPD: - STOP advair and START your symbicort 2 puffs twice daily. - Albuterol inhaler - use every 6 hours as needed for shortness of breath that does not go away after you rest.  4. For your colon cancer screening: Colon Cancer Screening: - For all adults age 18 and older, routine colon cancer screening is highly recommended. - Early detection of colon cancer is important, because often there are no warning signs or symptoms.  If colon cancer is found early, usually it can be cured. Advanced cancer is hard to treat. - If you are not interested in Colonoscopy screening (if done and normal you could be cleared for 5 to 10 years until next due), then Cologuard is an excellent alternative for screening test for Colon Cancer. It is highly sensitive for detecting DNA of colon cancer from even the earliest stages. Also, there is NO bowel prep required. - If Cologuard is NEGATIVE, then it is good for 3 years before next due - If Cologuard is POSITIVE, then it is strongly advised to get a Colonoscopy, which allows the GI doctor to locate the source of the cancer or polyp (even very early stage) and treat it by removing it. - The test kit will be delivered to your house in about 1 week. Follow instructions to collect your stool sample.  You may call the company for any help or questions, 24/7 telephone support at [phone].   Please schedule a follow-up appointment with [name], AGNP to Return in about 4 weeks (around 04/27/2017) for diabetes.   Confirm your transportation the day before your next appointment.  If you have any other questions or concerns, please feel free to call the clinic or send a message through [place]. You may also schedule an earlier appointment if  necessary.  [name], DNP, AGNP-BC Adult Gerontology Nurse Practitioner [place]

## 2017-03-31 LAB — COMPREHENSIVE METABOLIC PANEL
ALT: 18 U/L (ref 6–29)
AST: 13 U/L (ref 10–35)
Albumin: 4.3 g/dL (ref 3.6–5.1)
Alkaline Phosphatase: 75 U/L (ref 33–130)
BUN: 10 mg/dL (ref 7–25)
CO2: 22 mmol/L (ref 20–31)
Calcium: 9.6 mg/dL (ref 8.6–10.4)
Chloride: 103 mmol/L (ref 98–110)
Creat: 0.81 mg/dL (ref 0.50–0.99)
Glucose, Bld: 130 mg/dL — ABNORMAL HIGH (ref 65–99)
Potassium: 4 mmol/L (ref 3.5–5.3)
Sodium: 138 mmol/L (ref 135–146)
Total Bilirubin: 0.5 mg/dL (ref 0.2–1.2)
Total Protein: 7.1 g/dL (ref 6.1–8.1)

## 2017-03-31 LAB — VITAMIN D 25 HYDROXY (VIT D DEFICIENCY, FRACTURES): Vit D, 25-Hydroxy: 17 ng/mL — ABNORMAL LOW (ref 30–100)

## 2017-04-01 DIAGNOSIS — M5410 Radiculopathy, site unspecified: Secondary | ICD-10-CM | POA: Insufficient documentation

## 2017-04-01 MED ORDER — VITAMIN D (ERGOCALCIFEROL) 1.25 MG (50000 UNIT) PO CAPS: 50000.0000 [IU] | ORAL_CAPSULE | ORAL | 0 refills | Status: DC

## 2017-04-01 NOTE — Assessment & Plan Note (Signed)
Pt w/ prior vitamin D deficiency on ergocalciferol 50,000 IU once weekly supplement.  Pt requests refill today.    Plan: 1. Hold refill until assessment of current vitamin D levels. 2. Vitamin D 25 = 17, repeat prescription strength vitamin D replacement for 12 weeks. 3. Follow up 3 months

## 2017-04-01 NOTE — Assessment & Plan Note (Addendum)
Pt notes gabapentin dose makes her sleepy.  Does help neuropathy.  Plan:  1. Continue gabapentin 300 mg at bedtime.   2. Continue atorvastatin for ASCVD risk reduction for pt w/ DM. 3. FOllow up w/ DM in about 4 weeks.

## 2017-04-01 NOTE — Assessment & Plan Note (Signed)
Pt w/ osteoarthritis of multiple joints.  R wrist w/ tenosynovitis.  Left wrist w/ mild tenosynovitis.  Prior workup for surgery, but no follow through.  Pt desires referral.  Plan: 1. Proceed w/ referral to [facility]. 2. Follow up at [facility] as needed.

## 2017-04-01 NOTE — Assessment & Plan Note (Addendum)
Status unknown.  Pt not fasting today.  Pt taking atorvastatin 40 mg once daily.  Plan: 1. Continue atorvastatin.  Refill provided 2. Encouraged heart healthy, low carb diet. 3. Follow up 4 weeks w/ DM visit.

## 2017-04-01 NOTE — Assessment & Plan Note (Signed)
Stable, uncontrolled.  Pt w/o recent regular use of inhalers.  Pt needs refill of albuterol and maintenance inhaler.    Plan: 1. START symbicort instead of advair r/t insurance coverage w/ medicare preferred formulary.  Take 2 puffs twice daily. 2. START albuterol 2 puffs up to every 6 hours for shortness of breath. 3. Continue follow up w/ pulmonology.  4. Follow up w/ [facility] as needed.

## 2017-04-02 ENCOUNTER — Telehealth: Payer: Self-pay | Admitting: Nurse Practitioner

## 2017-04-02 NOTE — Telephone Encounter (Signed)
Pt called for lab results [phone]

## 2017-04-02 NOTE — Addendum Note (Signed)
Addended by: Cleaster Corin on: 04/02/2017 04:45 PM   Modules accepted: Orders

## 2017-04-02 NOTE — Telephone Encounter (Signed)
The pt was notified. No questions or concerns. 

## 2017-04-02 NOTE — Progress Notes (Signed)
I have reviewed this encounter including the documentation in this note and/or discussed this patient with the provider, [name], AGPCNP-BC. I am certifying that I agree with the content of this note as supervising physician.  Nobie Putnam, [facility] 04/02/2017, 8:34 AM

## 2017-04-05 LAB — VITAMIN D 1,25 DIHYDROXY
Vitamin D 1, 25 (OH)2 Total: 45 pg/mL (ref 18–72)
Vitamin D2 1, 25 (OH)2: 24 pg/mL
Vitamin D3 1, 25 (OH)2: 21 pg/mL

## 2017-04-11 ENCOUNTER — Other Ambulatory Visit: Payer: Self-pay

## 2017-04-11 MED ORDER — MEDERMA EX GEL: 1.0000 "application " | Freq: Every day | CUTANEOUS | 0 refills | Status: DC

## 2017-04-30 ENCOUNTER — Ambulatory Visit: Payer: Medicare Other | Admitting: Nurse Practitioner

## 2017-05-01 ENCOUNTER — Other Ambulatory Visit: Payer: Self-pay

## 2017-05-01 DIAGNOSIS — M25572 Pain in left ankle and joints of left foot: Secondary | ICD-10-CM

## 2017-05-01 DIAGNOSIS — F331 Major depressive disorder, recurrent, moderate: Secondary | ICD-10-CM

## 2017-05-01 DIAGNOSIS — E1142 Type 2 diabetes mellitus with diabetic polyneuropathy: Secondary | ICD-10-CM

## 2017-05-01 DIAGNOSIS — M62838 Other muscle spasm: Secondary | ICD-10-CM

## 2017-05-01 DIAGNOSIS — J449 Chronic obstructive pulmonary disease, unspecified: Secondary | ICD-10-CM

## 2017-05-01 MED ORDER — GABAPENTIN 300 MG PO CAPS: ORAL_CAPSULE | ORAL | 1 refills | Status: DC

## 2017-05-01 MED ORDER — GLUCOSE BLOOD VI STRP: ORAL_STRIP | 5 refills | Status: DC

## 2017-05-01 MED ORDER — MELOXICAM 15 MG PO TABS: 15.0000 mg | ORAL_TABLET | Freq: Every day | ORAL | 0 refills | Status: DC

## 2017-05-01 MED ORDER — INSULIN GLARGINE 100 UNIT/ML SOLOSTAR PEN: PEN_INJECTOR | SUBCUTANEOUS | 2 refills | Status: DC

## 2017-05-01 MED ORDER — CITALOPRAM HYDROBROMIDE 20 MG PO TABS: 20.0000 mg | ORAL_TABLET | Freq: Every day | ORAL | 1 refills | Status: DC

## 2017-05-01 MED ORDER — BUDESONIDE-FORMOTEROL FUMARATE 160-4.5 MCG/ACT IN AERO: 2.0000 | INHALATION_SPRAY | Freq: Two times a day (BID) | RESPIRATORY_TRACT | 5 refills | Status: DC

## 2017-05-01 NOTE — Telephone Encounter (Signed)
Pt was to follow up around 04/30/17 for diabetes follow up.  She does not have any appointments scheduled.  Please schedule follow up ASAP.

## 2017-05-10 ENCOUNTER — Telehealth: Payer: Self-pay | Admitting: Nurse Practitioner

## 2017-05-10 NOTE — Telephone Encounter (Signed)
Pt needs refills on gabapentin, vit D, atorvastatin, fluid pills, insulin pens and tips.  She said she uses [facility] mail order  [phone]

## 2017-05-15 NOTE — Telephone Encounter (Signed)
Rx send 08/15.

## 2017-05-22 ENCOUNTER — Other Ambulatory Visit: Payer: Self-pay | Admitting: Nurse Practitioner

## 2017-05-22 DIAGNOSIS — J449 Chronic obstructive pulmonary disease, unspecified: Secondary | ICD-10-CM

## 2017-05-29 ENCOUNTER — Other Ambulatory Visit: Payer: Self-pay

## 2017-05-29 ENCOUNTER — Other Ambulatory Visit: Payer: Self-pay | Admitting: Family Medicine

## 2017-05-29 DIAGNOSIS — E559 Vitamin D deficiency, unspecified: Secondary | ICD-10-CM

## 2017-05-29 MED ORDER — VITAMIN D (ERGOCALCIFEROL) 1.25 MG (50000 UNIT) PO CAPS: 50000.0000 [IU] | ORAL_CAPSULE | ORAL | 1 refills | Status: DC

## 2017-05-30 ENCOUNTER — Other Ambulatory Visit: Payer: Self-pay

## 2017-05-30 DIAGNOSIS — E1142 Type 2 diabetes mellitus with diabetic polyneuropathy: Secondary | ICD-10-CM

## 2017-05-30 DIAGNOSIS — J449 Chronic obstructive pulmonary disease, unspecified: Secondary | ICD-10-CM

## 2017-05-30 DIAGNOSIS — F331 Major depressive disorder, recurrent, moderate: Secondary | ICD-10-CM

## 2017-05-30 DIAGNOSIS — M25572 Pain in left ankle and joints of left foot: Secondary | ICD-10-CM

## 2017-05-30 MED ORDER — ACCU-CHEK AVIVA PLUS W/DEVICE KIT: PACK | 0 refills | Status: DC

## 2017-05-30 MED ORDER — ACCU-CHEK SOFTCLIX LANCETS MISC: 1 refills | Status: DC

## 2017-05-30 MED ORDER — GABAPENTIN 300 MG PO CAPS: ORAL_CAPSULE | ORAL | 1 refills | Status: DC

## 2017-05-30 MED ORDER — MELOXICAM 15 MG PO TABS: 15.0000 mg | ORAL_TABLET | Freq: Every day | ORAL | 0 refills | Status: DC

## 2017-05-30 MED ORDER — CITALOPRAM HYDROBROMIDE 20 MG PO TABS: 20.0000 mg | ORAL_TABLET | Freq: Every day | ORAL | 1 refills | Status: DC

## 2017-05-30 MED ORDER — BUDESONIDE-FORMOTEROL FUMARATE 160-4.5 MCG/ACT IN AERO: 2.0000 | INHALATION_SPRAY | Freq: Two times a day (BID) | RESPIRATORY_TRACT | 1 refills | Status: DC

## 2017-05-30 NOTE — Telephone Encounter (Signed)
Patient requesting refill of Flexeril to [facility].

## 2017-05-31 ENCOUNTER — Telehealth: Payer: Self-pay

## 2017-05-31 NOTE — Telephone Encounter (Signed)
I spoke w/ pt and cleared of all her concerns about her medication that was recalled.

## 2017-06-04 ENCOUNTER — Telehealth: Payer: Self-pay | Admitting: Nurse Practitioner

## 2017-06-04 NOTE — Telephone Encounter (Signed)
Please call pt and triage then schedule appointment.

## 2017-06-04 NOTE — Telephone Encounter (Signed)
Shoulder pain needs to be evaluated before treatment.  No medication will be provided via phone for this complaint.

## 2017-06-04 NOTE — Telephone Encounter (Signed)
I called the pt and she informed me that her shoulder pain feels like cold water under her skin. The pt was scheduled to come in to be evaluated on this Friday.

## 2017-06-04 NOTE — Telephone Encounter (Signed)
Pt said she needed something for her right shoulder pain.  Her call back number is [phone]

## 2017-06-08 ENCOUNTER — Encounter: Payer: Self-pay | Admitting: Nurse Practitioner

## 2017-06-08 ENCOUNTER — Ambulatory Visit (INDEPENDENT_AMBULATORY_CARE_PROVIDER_SITE_OTHER): Payer: Medicare Other | Admitting: Nurse Practitioner

## 2017-06-08 VITALS — BP 125/55 | HR 66 | Temp 98.2°F | Ht 64.0 in | Wt 150.2 lb

## 2017-06-08 DIAGNOSIS — R809 Proteinuria, unspecified: Secondary | ICD-10-CM | POA: Diagnosis not present

## 2017-06-08 DIAGNOSIS — Z23 Encounter for immunization: Secondary | ICD-10-CM

## 2017-06-08 DIAGNOSIS — E118 Type 2 diabetes mellitus with unspecified complications: Secondary | ICD-10-CM

## 2017-06-08 DIAGNOSIS — M25511 Pain in right shoulder: Secondary | ICD-10-CM

## 2017-06-08 DIAGNOSIS — J449 Chronic obstructive pulmonary disease, unspecified: Secondary | ICD-10-CM

## 2017-06-08 DIAGNOSIS — E785 Hyperlipidemia, unspecified: Secondary | ICD-10-CM | POA: Diagnosis not present

## 2017-06-08 DIAGNOSIS — E1129 Type 2 diabetes mellitus with other diabetic kidney complication: Secondary | ICD-10-CM | POA: Diagnosis not present

## 2017-06-08 DIAGNOSIS — E559 Vitamin D deficiency, unspecified: Secondary | ICD-10-CM

## 2017-06-08 DIAGNOSIS — E1142 Type 2 diabetes mellitus with diabetic polyneuropathy: Secondary | ICD-10-CM

## 2017-06-08 LAB — POCT GLYCOSYLATED HEMOGLOBIN (HGB A1C): Hemoglobin A1C: 6.6

## 2017-06-08 LAB — POCT UA - MICROALBUMIN: Microalbumin Ur, POC: 50 mg/L

## 2017-06-08 MED ORDER — VITAMIN D (ERGOCALCIFEROL) 1.25 MG (50000 UNIT) PO CAPS: 50000.0000 [IU] | ORAL_CAPSULE | ORAL | 0 refills | Status: DC

## 2017-06-08 MED ORDER — LISINOPRIL 2.5 MG PO TABS: 2.5000 mg | ORAL_TABLET | Freq: Every day | ORAL | 0 refills | Status: DC

## 2017-06-08 MED ORDER — MELOXICAM 15 MG PO TABS: 15.0000 mg | ORAL_TABLET | Freq: Every day | ORAL | 0 refills | Status: DC

## 2017-06-08 MED ORDER — LIDOCAINE 5 % EX OINT: 1.0000 "application " | TOPICAL_OINTMENT | CUTANEOUS | 0 refills | Status: DC | PRN

## 2017-06-08 MED ORDER — BUDESONIDE-FORMOTEROL FUMARATE 160-4.5 MCG/ACT IN AERO: 2.0000 | INHALATION_SPRAY | Freq: Two times a day (BID) | RESPIRATORY_TRACT | 1 refills | Status: DC

## 2017-06-08 MED ORDER — ATORVASTATIN CALCIUM 40 MG PO TABS: 40.0000 mg | ORAL_TABLET | Freq: Every day | ORAL | 1 refills | Status: DC

## 2017-06-08 NOTE — Patient Instructions (Addendum)
Stephaney, Thank you for coming in to clinic today.  1. Your provider would like to you have your annual eye exam. Please contact your current eye doctor or here are some good options for you to contact.   [facility] Address: [address] [place], [place] [address]  Address: [address], [place], [place] [address]  Phone: [phone]     Phone: [phone]  Website: [email or web address]   Website: [email or web address]     [name]  Address: [place], [place], [place] [address]  Address: [place], [place], [place] [address] Phone: [phone]     Phone: [phone]    [name] Address: [place], [place], [place] [address]  Phone: [phone]  2. START taking lisinopril 2.5 mg once daily, then recheck your kidney function with labs in this clinic 2 weeks after you start this medicine. You will be due for FASTING BLOOD WORK (no food or drink after midnight before, only water or coffee without cream/sugar on the morning of)  - Please go ahead and schedule a "Lab Only" visit in the morning at the clinic for lab draw in 3 WEEKS before next Annual Physical   3. Continue lantus 22 units once daily.  4. For your shoulder: - Continue your lidocaine rub up to 3 times daily - START baclofen 10 mg at bedtime for 15 days. - You received a Right Shoulder Joint steroid injection today. - Lidocaine numbing medicine may ease the pain initially for a few hours until it wears off - As discussed, you may experience a "steroid flare" this evening or within 24-48 hours, anytime medicine is injected into an inflamed joint it can cause the pain to get worse temporarily - Everyone responds differently to these injections, it depends on the patient and the severity of the joint problem, it may provide anywhere from days to weeks, to months of relief. Ideal response is >6 months relief - Try to take it easy for  next 1-2 days, avoid over activity and strain on joint (limit lifting for shoulder) - Recommend the following:   - For swelling - rest, compression sleeve / ACE wrap, elevation, and ice packs as needed for first few days   - For pain in future may use heating pad or moist heat as needed   Please schedule a follow-up appointment with [name], AGNP. Return in about 3 months (around 09/07/2017) for Diabetes, Cholesterol.  If you have any other questions or concerns, please feel free to call the clinic or send a message through [place]. You may also schedule an earlier appointment if necessary.  You will receive a survey after today's visit either digitally by e-mail or paper by [employer]. Your experiences and feedback matter to [place].  Please respond so we know how we are doing as we provide care for you.   [name], DNP, AGNP-BC Adult Gerontology Nurse Practitioner [place]

## 2017-06-08 NOTE — Progress Notes (Signed)
Subjective:    Patient ID: Kendra [name], female    DOB: [birth year], 71 y.o.   MRN: [id number]  Kendra [name] is a 71 y.o. female presenting on 06/08/2017 for Shoulder Pain (right shoulder pain that radiates down the arm x 58mth ) and Diabetes   HPI Right Shoulder Pain No known injury for right shoulder.  Known arthritis throughout her body affecting multiple joints.  - Pt describes pain that is aching, and occasionally sharp w/ movement.   Sometimes feels like cold water is under skin lasting 1 month w/ gradual worsening.  - Can move her right arm, but pain occurs, so Pt has had to limit her movements.   - Pain currently prevents sleep, cannot comb her hair, wash dishes, mop floor. - Tylenol 1,000 mg every 12 hours intermittently is not helping. Is still taking meloxicam 15 mg once daily.   - Has used lidocaine cream - helps for about 8 hours. Uses once per day at bedtime, but does not help her sleep. - No previous right shoulder surgeries or injections    Diabetes Pt presents today for follow up of Type 2 diabetes mellitus. She is checking fasting am CBG at home with a range of 200, 120, 75  Mostly between 120-150 - Current diabetic medications include: lantus 22 units once daily - She is not currently symptomatic.  - She denies polydipsia, polyphagia, polyuria, headaches, diaphoresis, shakiness, chills, pain, numbness or tingling in extremities and changes in vision.   - Clinical course has been stable. - She  reports no regular exercise routine. Cleaning house when able. - Her diet is moderate in salt, moderate in fat, and moderate in carbohydrates. - Weight trend: stable  PREVENTION: Eye exam current (within one year): unknown - Needs one Foot exam current (within one year): yes Lipid/ASCVD risk reduction - on statin: yes Kidney protection - on ace or arb: started today  Recent Labs  07/26/16 1216 06/08/17 1010  HGBA1C 6.4* 6.6    Social History  Substance Use  Topics  . Smoking status: Current Some Day Smoker    Packs/day: 0.25    Types: Cigarettes  . Smokeless tobacco: Never Used  . Alcohol use No    Review of Systems Per HPI unless specifically indicated above     Objective:    BP (!) 125/55 (BP Location: Right Arm, Patient Position: Sitting, Cuff Size: Normal)   Pulse 66   Temp 98.2 F (36.8 C) (Oral)   Ht 5\' 4"  (1.626 m)   Wt 150 lb 3.2 oz (68.1 kg)   SpO2 100%   BMI 25.78 kg/m   Wt Readings from Last 3 Encounters:  06/08/17 150 lb 3.2 oz (68.1 kg)  03/30/17 150 lb 3.2 oz (68.1 kg)  09/29/16 143 lb 12.8 oz (65.2 kg)    Physical Exam  General - frail older adult, well-appearing, NAD HEENT - Normocephalic, atraumatic Neck - supple, non-tender, no LAD, no carotid bruit Heart - RRR, no murmurs heard Lungs - Clear throughout all lobes, no wheezing, crackles, or rhonchi. Normal work of breathing. Musculoskeletal - Right Shoulder Inspection: Normal appearance bilateral symmetrical Palpation: Non-tender to palpation over anterior, lateral, or posterior shoulder ROM: Limited ROM w/ forward flexion, abduction, internal / external rotation on R w/ pain posterior shoulder. Left shoulder w/ full AROM. Special Testing: Rotator cuff testing negative for weakness with supraspinatus full can and empty can test, O'brien's negative for labral pain, Hawkin's AC impingement negative for pain Strength: Normal strength  5/5 flex/ext, ext rot / int rot, grip, rotator cuff str testing. Neurovascular: Distally intact pulses, sensation to light touch  Extremeties - non-tender, +1 pedal edema, cap refill < 2 seconds, peripheral pulses intact +2 bilaterally Skin - warm, dry Neuro - awake, alert, oriented x3, antalgic gait - patient walks w/ walker Psych - Normal mood and affect, normal behavior   Diabetic Foot Exam - Simple   Simple Foot Form Diabetic Foot exam was performed with the following findings:  Yes 06/08/2017 10:15 AM  Visual  Inspection See comments:  Yes Sensation Testing Intact to touch and monofilament testing bilaterally:  Yes Pulse Check Posterior Tibialis and Dorsalis pulse intact bilaterally:  Yes Comments Right foot third digit w/ hammer toe. 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      Results for orders placed or performed in visit on 06/08/17  POCT UA - Microalbumin  Result Value Ref Range   Microalbumin Ur, POC 50 mg/L   Creatinine, POC  mg/dL   Albumin/Creatinine Ratio, Urine, POC        Assessment & Plan:   Problem List Items Addressed This Visit      Respiratory   COPD, moderate ([place])    Pt is tolerating inhalers well.  Needs refills.  Plan: 1. Provide refill of Symbicort      Relevant Medications   budesonide-formoterol (SYMBICORT) 160-4.5 MCG/ACT inhaler   methylPREDNISolone acetate (DEPO-MEDROL) injection 40 mg (Completed)     Endocrine   Diabetic peripheral neuropathy associated with type 2 diabetes mellitus (HCC)    Stable neuropathy.  Pt notes gabapentin dose makes her sleepy and has had adequate relief of symptoms w/ HS dosing.   Plan:  1. Continue gabapentin 300 mg at bedtime.   2. Follow up  3 months.      Relevant Medications   lisinopril (PRINIVIL,ZESTRIL) 2.5 MG tablet   atorvastatin (LIPITOR) 40 MG tablet   Other Relevant Orders   POCT glycosylated hemoglobin (Hb A1C) (Completed)   POCT UA - Microalbumin (Completed)   Diabetes mellitus type 2, controlled, with complications ([place]) - Primary    Controlled T2DM w/ A1c <7.0% and at goal.  Today A1c = 6.6%.  Pt taking lantus 22 units once daily w/o any other medications.   Plan: 1. Continue lantus 22 units once daily w/o changes. 2. Reviewed need for physical activity. 3. Encouraged healthy diet w/ low glycemic foods. 4. Follow up 3 months.      Relevant Medications   lisinopril (PRINIVIL,ZESTRIL) 2.5 MG tablet   atorvastatin (LIPITOR) 40 MG tablet     Other   Dyslipidemia    No  recent lipid panel.  Pt taking atorvastatin 40 mg once daily and is tolerating well. - Controlled on lipid panel collected 06/08/17 w/ LDL = 70 which is goal for pt w/ DMT2  Plan: 1. Continue atorvastatin.  Refill provided 2. Encouraged heart healthy, low carb diet. 3. Lipid panel today. 4. Follow up 3 months.      Relevant Medications   atorvastatin (LIPITOR) 40 MG tablet   Other Relevant Orders   Comprehensive metabolic panel   Lipid panel (Completed)   Vitamin D deficiency    Pt w/ prior vitamin D deficiency on ergocalciferol 50,000 IU once weekly supplement.  Pt requests refill today.    Plan: 1. Vitamin D repeated and remains low.  Continue prescription strength vitamin D replacement for 8 weeks. 3. Follow up 3 months      Relevant Medications  Vitamin D, Ergocalciferol, (DRISDOL) 50000 units CAPS capsule    Other Visit Diagnoses    Microalbuminuria due to type 2 diabetes mellitus ([place])       See AP for DMT2   Relevant Medications   lisinopril (PRINIVIL,ZESTRIL) 2.5 MG tablet   atorvastatin (LIPITOR) 40 MG tablet   Other Relevant Orders   BASIC METABOLIC PANEL WITH GFR   Comprehensive metabolic panel   Acute pain of right shoulder       Pt w/ pain lasting about 1 month w/ limitation of AROM.  Pt has has adequate conservative therapy prior to visit.  Pain impacts patient's ability to complete ADLs and iADLs.  Plan: 1. Perform subacromial right shoulder injection w/ corticosteroids today. See procedure note below. 2. Treat with NSAIDs (acetaminophen and meloxicam).  Discussed alternate dosing and max dosing. 3. Apply heat and/or ice to affected area. 4. May also apply a muscle rub with lidocaine after heat or ice. 5. Follow up as needed   Relevant Medications   meloxicam (MOBIC) 15 MG tablet   lidocaine (PF) (XYLOCAINE) 1 % injection 4 mL (Completed)   methylPREDNISolone acetate (DEPO-MEDROL) injection 40 mg (Completed)   Need for immunization against influenza      Pt > age 79.  Needs annual influenza vaccine.  Plan: 1. Administer high dose fluzone today.   Relevant Orders   Flu vaccine HIGH DOSE PF (Fluzone High dose)      Meds ordered this encounter  Medications  . lisinopril (PRINIVIL,ZESTRIL) 2.5 MG tablet    Sig: Take 1 tablet (2.5 mg total) by mouth daily.    Dispense:  90 tablet    Refill:  0  . atorvastatin (LIPITOR) 40 MG tablet    Sig: Take 1 tablet (40 mg total) by mouth daily. In place of Pravastatin    Dispense:  90 tablet    Refill:  1  . budesonide-formoterol (SYMBICORT) 160-4.5 MCG/ACT inhaler    Sig: Inhale 2 puffs into the lungs 2 (two) times daily.    Dispense:  20.4 g    Refill:  1    Must have appointment prior to additional refills.  . meloxicam (MOBIC) 15 MG tablet    Sig: Take 1 tablet (15 mg total) by mouth daily.    Dispense:  30 tablet    Refill:  0  . lidocaine (XYLOCAINE) 5 % ointment    Sig: Apply 1 application topically as needed.    Dispense:  35.44 g    Refill:  0  . Vitamin D, Ergocalciferol, (DRISDOL) 50000 units CAPS capsule    Sig: Take 1 capsule (50,000 Units total) by mouth every 7 (seven) days.    Dispense:  8 capsule    Refill:  0  . lidocaine (PF) (XYLOCAINE) 1 % injection 4 mL  . methylPREDNISolone acetate (DEPO-MEDROL) injection 40 mg   ________________________________________________________ PROCEDURE NOTE Date: 06/08/2017 Right subacromial shoulder injection Discussed benefits and risks (including pain, bleeding, infection, steroid flare). Verbal consent given by patient. Medication:  1 cc Depo-medrol 40mg  and 4 cc Lidocaine 1% without epi Time Out taken  Landmarks identified. Area cleansed with alcohol wipes.Using 21 gauge and 1, 1/2 inch needle, Right subacromial bursa space was injected (with above listed medication) via posterior approach.  Mild pain relief and increased AROM was obtained within 5 minutes after procedure.  Procedure observed directly by Dr. Nobie Putnam, DO   Follow up plan: Return in about 3 months (around 09/07/2017) for Diabetes, Cholesterol.  [name],  DNP, AGPCNP-BC Adult Gerontology Primary Care Nurse Practitioner [facility] 06/08/2017, 9:50 AM

## 2017-06-09 LAB — LIPID PANEL
Cholesterol: 147 mg/dL (ref <200)
HDL: 52 mg/dL (ref >50)
LDL Cholesterol (Calc): 70 mg/dL (calc)
Non-HDL Cholesterol (Calc): 95 mg/dL (calc) (ref <130)
Total CHOL/HDL Ratio: 2.8 (calc) (ref <5.0)
Triglycerides: 175 mg/dL — ABNORMAL HIGH (ref <150)

## 2017-06-09 LAB — VITAMIN D 25 HYDROXY (VIT D DEFICIENCY, FRACTURES): Vit D, 25-Hydroxy: 14 ng/mL — ABNORMAL LOW (ref 30–100)

## 2017-06-11 ENCOUNTER — Telehealth: Payer: Self-pay | Admitting: Nurse Practitioner

## 2017-06-11 NOTE — Telephone Encounter (Signed)
Pt needs someone to call her pharmacy.  They are wanting her to pay for medications.  Her call back number is [phone]

## 2017-06-14 DIAGNOSIS — Z23 Encounter for immunization: Secondary | ICD-10-CM

## 2017-06-14 MED ORDER — METHYLPREDNISOLONE ACETATE 40 MG/ML IJ SUSP
40.0000 mg | Freq: Once | INTRAMUSCULAR | Status: AC
  Administered 2017-06-08: 40 mg via INTRA_ARTICULAR

## 2017-06-14 MED ORDER — LIDOCAINE HCL (PF) 1 % IJ SOLN
4.0000 mL | Freq: Once | INTRAMUSCULAR | Status: AC
  Administered 2017-06-08: 4 mL

## 2017-06-14 NOTE — Assessment & Plan Note (Signed)
Controlled T2DM w/ A1c <7.0% and at goal.  Today A1c = 6.6%.  Pt taking lantus 22 units once daily w/o any other medications.   Plan: 1. Continue lantus 22 units once daily w/o changes. 2. Reviewed need for physical activity. 3. Encouraged healthy diet w/ low glycemic foods. 4. Follow up 3 months.

## 2017-06-14 NOTE — Assessment & Plan Note (Addendum)
No recent lipid panel.  Pt taking atorvastatin 40 mg once daily and is tolerating well. - Controlled on lipid panel collected 06/08/17 w/ LDL = 70 which is goal for pt w/ DMT2  Plan: 1. Continue atorvastatin.  Refill provided 2. Encouraged heart healthy, low carb diet. 3. Lipid panel today. 4. Follow up 3 months.

## 2017-06-14 NOTE — Assessment & Plan Note (Signed)
Pt is tolerating inhalers well.  Needs refills.  Plan: 1. Provide refill of Symbicort

## 2017-06-14 NOTE — Assessment & Plan Note (Signed)
Pt w/ prior vitamin D deficiency on ergocalciferol 50,000 IU once weekly supplement.  Pt requests refill today.    Plan: 1. Vitamin D repeated and remains low.  Continue prescription strength vitamin D replacement for 8 weeks. 3. Follow up 3 months

## 2017-06-14 NOTE — Assessment & Plan Note (Signed)
Stable neuropathy.  Pt notes gabapentin dose makes her sleepy and has had adequate relief of symptoms w/ HS dosing.   Plan:  1. Continue gabapentin 300 mg at bedtime.   2. Follow up  3 months.

## 2017-06-22 ENCOUNTER — Telehealth: Payer: Self-pay

## 2017-06-22 NOTE — Telephone Encounter (Signed)
The pt was notified, no fever, chills or bodyaches. Just cough and congestion. She verbalize understanding. No questions or concerns.

## 2017-06-22 NOTE — Telephone Encounter (Signed)
Pt called complaining of  A productive cough w/ whitish, darkess phlegm, fatigue, and weak. X 1 week  She scheduled an appt for Monday. She have transportation issues so can't  cannot come in today.

## 2017-06-22 NOTE — Telephone Encounter (Signed)
If she has high fever> 102 that does not respond to Tylenol, chills, sweats, feels very ill, cannot eat or drink, or cannot void she will need to go to ER before Monday.  She can also visit an urgent care over the weekend if feeling like she needs to be seen before Monday.  Otherwise, we will be more than willing to see her Monday.  Until then: - Use 1-2 tablet over the counter mucinex-DM 2 times daily for cough and congestion.   - Use acetaminophen/Tylenol 1-2 tablets every 6-8 hours as needed for fever, aches, pains. - Drink plenty of fluids. - Keep taking all medications, but especially her diabetes medications -

## 2017-06-25 ENCOUNTER — Ambulatory Visit: Payer: Medicare Other | Admitting: Nurse Practitioner

## 2017-07-02 ENCOUNTER — Telehealth: Payer: Self-pay

## 2017-07-02 DIAGNOSIS — M25511 Pain in right shoulder: Principal | ICD-10-CM

## 2017-07-02 DIAGNOSIS — G8929 Other chronic pain: Secondary | ICD-10-CM

## 2017-07-02 NOTE — Telephone Encounter (Signed)
The pt called complaining of of sharp pain in the right arm that radiates down the arm with sudden movement and cough. The pt states the symptoms started after her injection. I reminded the pt that her symptoms at the time of her last visit was   Aching pain, and occasionally sharp w/ movement.   Sometimes feels like cold water is under skin lasting 1 month w/ gradual worsening. Please advise

## 2017-07-02 NOTE — Telephone Encounter (Signed)
Pain is likely chronic in nature w/ symptoms consistent w/ prior evaluation.  Pt w/ severe ROM limitations at that time.  Now is approximately 3 weeks s/p corticosteroid injection to R shoulder subacromial space.  Will send orthopedic referral for additional evaluation.

## 2017-07-03 NOTE — Telephone Encounter (Signed)
Pt notified and referral was faxed over to [place].

## 2017-07-04 ENCOUNTER — Telehealth: Payer: Self-pay

## 2017-07-04 NOTE — Telephone Encounter (Signed)
Pt notified about [place] scheduled for Monday, Oct 22nd at 2:00pm.

## 2017-07-10 ENCOUNTER — Telehealth: Payer: Self-pay | Admitting: Nurse Practitioner

## 2017-07-10 DIAGNOSIS — E559 Vitamin D deficiency, unspecified: Secondary | ICD-10-CM

## 2017-07-10 MED ORDER — BACLOFEN 10 MG PO TABS: 10.0000 mg | ORAL_TABLET | Freq: Three times a day (TID) | ORAL | 0 refills | Status: DC | PRN

## 2017-07-10 MED ORDER — VITAMIN D (ERGOCALCIFEROL) 1.25 MG (50000 UNIT) PO CAPS: 50000.0000 [IU] | ORAL_CAPSULE | ORAL | 0 refills | Status: AC

## 2017-07-10 NOTE — Telephone Encounter (Signed)
Pt. Called request vitamin D, muscle spasm medication to be called into pt [facility] mail order. Pt call back # is  [phone]

## 2017-07-10 NOTE — Telephone Encounter (Signed)
Pt called.  Encouraged pt to use local pharmacy for faster medications than mail order.  Had never received Vitamin D from [facility] in September, so has not started this yet.  Rx sent for baclofen 10 mg three times daily prn and vitamin D once weekly to [facility].

## 2017-07-23 ENCOUNTER — Telehealth: Payer: Self-pay | Admitting: Nurse Practitioner

## 2017-07-23 DIAGNOSIS — M25511 Pain in right shoulder: Secondary | ICD-10-CM

## 2017-07-23 MED ORDER — MELOXICAM 15 MG PO TABS: 15.0000 mg | ORAL_TABLET | Freq: Every day | ORAL | 5 refills | Status: DC

## 2017-07-23 NOTE — Telephone Encounter (Signed)
Pt needs a refill on meloxicam sent to mail order [phone]

## 2017-07-31 ENCOUNTER — Other Ambulatory Visit: Payer: Self-pay | Admitting: Nurse Practitioner

## 2017-07-31 DIAGNOSIS — J449 Chronic obstructive pulmonary disease, unspecified: Secondary | ICD-10-CM

## 2017-09-04 ENCOUNTER — Ambulatory Visit: Payer: Medicare Other

## 2017-09-07 ENCOUNTER — Ambulatory Visit: Payer: Medicare Other | Admitting: Nurse Practitioner

## 2017-09-25 ENCOUNTER — Ambulatory Visit: Payer: Medicare Other

## 2017-09-25 ENCOUNTER — Other Ambulatory Visit: Payer: Self-pay | Admitting: Nurse Practitioner

## 2017-09-25 ENCOUNTER — Other Ambulatory Visit: Payer: Self-pay

## 2017-09-25 ENCOUNTER — Telehealth: Payer: Self-pay | Admitting: Nurse Practitioner

## 2017-09-25 DIAGNOSIS — F331 Major depressive disorder, recurrent, moderate: Secondary | ICD-10-CM

## 2017-09-25 DIAGNOSIS — J449 Chronic obstructive pulmonary disease, unspecified: Secondary | ICD-10-CM

## 2017-09-25 DIAGNOSIS — R809 Proteinuria, unspecified: Secondary | ICD-10-CM

## 2017-09-25 DIAGNOSIS — E785 Hyperlipidemia, unspecified: Secondary | ICD-10-CM

## 2017-09-25 DIAGNOSIS — M25511 Pain in right shoulder: Secondary | ICD-10-CM

## 2017-09-25 DIAGNOSIS — E1142 Type 2 diabetes mellitus with diabetic polyneuropathy: Secondary | ICD-10-CM

## 2017-09-25 DIAGNOSIS — E1129 Type 2 diabetes mellitus with other diabetic kidney complication: Secondary | ICD-10-CM

## 2017-09-25 MED ORDER — INSULIN PEN NEEDLE 32G X 4 MM MISC: 2 refills | Status: DC

## 2017-09-25 MED ORDER — CITALOPRAM HYDROBROMIDE 20 MG PO TABS: 20.0000 mg | ORAL_TABLET | Freq: Every day | ORAL | 1 refills | Status: DC

## 2017-09-25 MED ORDER — MELOXICAM 15 MG PO TABS: 15.0000 mg | ORAL_TABLET | Freq: Every day | ORAL | 5 refills | Status: DC

## 2017-09-25 MED ORDER — BACLOFEN 10 MG PO TABS
10.0000 mg | ORAL_TABLET | Freq: Three times a day (TID) | ORAL | 0 refills | Status: DC | PRN
Start: 2017-09-25 — End: 2018-01-17

## 2017-09-25 MED ORDER — GABAPENTIN 300 MG PO CAPS: ORAL_CAPSULE | ORAL | 1 refills | Status: DC

## 2017-09-25 MED ORDER — ATORVASTATIN CALCIUM 40 MG PO TABS: 40.0000 mg | ORAL_TABLET | Freq: Every day | ORAL | 1 refills | Status: DC

## 2017-09-25 MED ORDER — LIDOCAINE 5 % EX OINT: 1.0000 "application " | TOPICAL_OINTMENT | CUTANEOUS | 0 refills | Status: DC | PRN

## 2017-09-25 MED ORDER — LISINOPRIL 2.5 MG PO TABS: 2.5000 mg | ORAL_TABLET | Freq: Every day | ORAL | 0 refills | Status: DC

## 2017-09-25 MED ORDER — GLUCOSE BLOOD VI STRP: ORAL_STRIP | 5 refills | Status: DC

## 2017-09-25 MED ORDER — BUDESONIDE-FORMOTEROL FUMARATE 160-4.5 MCG/ACT IN AERO: INHALATION_SPRAY | RESPIRATORY_TRACT | 3 refills | Status: DC

## 2017-09-25 MED ORDER — ALBUTEROL SULFATE HFA 108 (90 BASE) MCG/ACT IN AERS: 2.0000 | INHALATION_SPRAY | Freq: Four times a day (QID) | RESPIRATORY_TRACT | 5 refills | Status: DC | PRN

## 2017-09-25 MED ORDER — ACCU-CHEK SOFTCLIX LANCETS MISC: 1 refills | Status: DC

## 2017-09-25 MED ORDER — INSULIN GLARGINE 100 UNIT/ML SOLOSTAR PEN: PEN_INJECTOR | SUBCUTANEOUS | 2 refills | Status: DC

## 2017-09-25 MED ORDER — LIDOCAINE 5 % EX OINT: 1.0000 "application " | TOPICAL_OINTMENT | Freq: Four times a day (QID) | CUTANEOUS | 0 refills | Status: DC | PRN

## 2017-09-25 NOTE — Progress Notes (Signed)
The pt confirmed that she does want to change her pharmacy and would like all her prescription sent over.

## 2017-09-25 NOTE — Progress Notes (Signed)
Received notification pt would like to start using [facility].  Not all meds were on request for prescriptions.  Please call to confirm with patient that all meds are to go to [facility].

## 2017-09-25 NOTE — Telephone Encounter (Signed)
Corrine with [facility] has a question about directions on a prescription [phone]

## 2017-10-02 ENCOUNTER — Telehealth: Payer: Self-pay

## 2017-10-02 ENCOUNTER — Ambulatory Visit: Payer: Self-pay

## 2017-10-02 DIAGNOSIS — Z748 Other problems related to care provider dependency: Secondary | ICD-10-CM

## 2017-10-02 NOTE — Telephone Encounter (Signed)
Cancelled appt due to no transportation. Will placed referral to connected care.

## 2017-10-03 ENCOUNTER — Other Ambulatory Visit: Payer: Self-pay | Admitting: Nurse Practitioner

## 2017-10-03 DIAGNOSIS — E1129 Type 2 diabetes mellitus with other diabetic kidney complication: Secondary | ICD-10-CM

## 2017-10-03 DIAGNOSIS — E1142 Type 2 diabetes mellitus with diabetic polyneuropathy: Secondary | ICD-10-CM

## 2017-10-03 DIAGNOSIS — R809 Proteinuria, unspecified: Secondary | ICD-10-CM

## 2017-10-10 ENCOUNTER — Other Ambulatory Visit: Payer: Self-pay

## 2017-10-10 DIAGNOSIS — E559 Vitamin D deficiency, unspecified: Secondary | ICD-10-CM

## 2017-10-10 MED ORDER — ERGOCALCIFEROL 1.25 MG (50000 UT) PO CAPS: 50000.0000 [IU] | ORAL_CAPSULE | ORAL | 0 refills | Status: DC

## 2017-11-30 ENCOUNTER — Other Ambulatory Visit: Payer: Self-pay | Admitting: Nurse Practitioner

## 2017-11-30 DIAGNOSIS — E1129 Type 2 diabetes mellitus with other diabetic kidney complication: Secondary | ICD-10-CM

## 2017-11-30 DIAGNOSIS — E559 Vitamin D deficiency, unspecified: Secondary | ICD-10-CM

## 2017-11-30 DIAGNOSIS — R809 Proteinuria, unspecified: Secondary | ICD-10-CM

## 2017-12-09 DIAGNOSIS — Z794 Long term (current) use of insulin: Secondary | ICD-10-CM | POA: Diagnosis not present

## 2017-12-09 DIAGNOSIS — E1136 Type 2 diabetes mellitus with diabetic cataract: Secondary | ICD-10-CM | POA: Diagnosis not present

## 2017-12-09 DIAGNOSIS — Z6827 Body mass index (BMI) 27.0-27.9, adult: Secondary | ICD-10-CM | POA: Diagnosis not present

## 2017-12-09 DIAGNOSIS — M545 Low back pain: Secondary | ICD-10-CM | POA: Diagnosis not present

## 2017-12-09 DIAGNOSIS — F17210 Nicotine dependence, cigarettes, uncomplicated: Secondary | ICD-10-CM | POA: Diagnosis not present

## 2017-12-09 DIAGNOSIS — Z8673 Personal history of transient ischemic attack (TIA), and cerebral infarction without residual deficits: Secondary | ICD-10-CM | POA: Diagnosis not present

## 2017-12-09 DIAGNOSIS — B351 Tinea unguium: Secondary | ICD-10-CM | POA: Diagnosis not present

## 2017-12-09 DIAGNOSIS — F330 Major depressive disorder, recurrent, mild: Secondary | ICD-10-CM | POA: Diagnosis not present

## 2017-12-09 DIAGNOSIS — E663 Overweight: Secondary | ICD-10-CM | POA: Diagnosis not present

## 2017-12-09 DIAGNOSIS — H547 Unspecified visual loss: Secondary | ICD-10-CM | POA: Diagnosis not present

## 2017-12-09 DIAGNOSIS — E785 Hyperlipidemia, unspecified: Secondary | ICD-10-CM | POA: Diagnosis not present

## 2017-12-09 DIAGNOSIS — F418 Other specified anxiety disorders: Secondary | ICD-10-CM | POA: Diagnosis not present

## 2017-12-09 DIAGNOSIS — E1142 Type 2 diabetes mellitus with diabetic polyneuropathy: Secondary | ICD-10-CM | POA: Diagnosis not present

## 2017-12-10 ENCOUNTER — Other Ambulatory Visit: Payer: Self-pay | Admitting: Nurse Practitioner

## 2017-12-10 DIAGNOSIS — Z1231 Encounter for screening mammogram for malignant neoplasm of breast: Secondary | ICD-10-CM

## 2017-12-11 DIAGNOSIS — M170 Bilateral primary osteoarthritis of knee: Secondary | ICD-10-CM | POA: Diagnosis not present

## 2017-12-11 DIAGNOSIS — M545 Low back pain: Secondary | ICD-10-CM | POA: Diagnosis not present

## 2017-12-27 ENCOUNTER — Other Ambulatory Visit: Payer: Self-pay

## 2018-01-04 ENCOUNTER — Other Ambulatory Visit: Payer: Self-pay | Admitting: *Deleted

## 2018-01-04 NOTE — Patient Outreach (Signed)
[facility]) Care Management  01/04/2018  Kendra [name] [birth year] [id number]   Telephone screening call  Referral received 4/15 Referral source: PCP office, insurance referral  Referral reason; COPD,  Transportation needs, concern for paying more medication.  Insurance: [employer] screening call to patient, HIPAA verified, explained purpose of call.  Patient discussed she missed her recent appointment due to not having a ride, patient states she does not drive.  Social Patient lives at home with her son, patient does not drive and recently cancelled  MD office visit due to no transportation .  Patient discussed she had medicaid and transportation up until August of 2018 and they would taxi to her home for  transportation. Patient reports she gets $ 36 in food stamps monthly/ Patient reports being independent with care.   Conditions  Patient discussed having history of COPD, she is not familiar with the zone information when discussed. Patient reports using her symbicort daily , but does not have Proair. Patient denies increased in shortness of breath, cough or sputum production with color change.  Patient reports she has diabetes, reports she has blood sugar meter but it will not come on . Patient discussed she has asked her PCP about the meter that does not require finger sticks .  Patient reports she is taking her insulin daily. Denies having symptoms of low blood sugar when reviewed .   Medications Patient discussed getting her medication from [facility], patient states cost of medication is about  $3.40 including inhaler that she gets from [facility] [facility] [employer]. Patient discussed not having ProAir for a couple of months, reports she request it but they do not send it, " I got tried of calling about it. Patient reports she has number to [facility] and will try again .   Consent  Explained and offered [facility] care management services and patient agreeable. Patient will  benefit from care management community care manager services for COPD education and assist with education on use of blood sugar meter.  Patient will benefit from LCSW consult for transportation needs .    Plan Will mark case as active Will place LCSW consult for transportation needs and financial difficulties  Will place pharmacy consult for patient concern regarding problems paying for  medication  and difficulty  getting ProAir inhaler from [facility].  Will plan follow up call in the next week to complete  assessments and plan home visit .  Provided patient with [facility] care management and care coordinator contact information. Provided and explained [employer] transportation benefit  and provided contact number.    Joylene Draft, RN, [facility] Management Coordinator  [phone]- Mobile [phone]- Toll Free Main Office

## 2018-01-07 ENCOUNTER — Other Ambulatory Visit: Payer: Self-pay | Admitting: Pharmacist

## 2018-01-07 ENCOUNTER — Other Ambulatory Visit: Payer: Self-pay | Admitting: *Deleted

## 2018-01-07 NOTE — Patient Outreach (Signed)
[facility]) Care Management  01/07/2018  Kendra [name] 1946/03/26 [id number]  Telephone assessment   Successful outreach call to patient ,for assessments not able to complete on initial contact on 4/19, due to severe weather in her area. HIPAA information verified.   Patient reports feeling alright on this am denies any increase in shortness of breath this am.  Patient agreeable to home visit by care coordinator to assist with use of blood sugar meter, further assessment of needs and care planning, goal setting.   Plan  Will plan home visit on this week.    Joylene Draft, RN, [facility] Management Coordinator  [phone]- Mobile [phone]- Toll Free Main Office

## 2018-01-07 NOTE — Patient Outreach (Signed)
[facility]) Care Management  01/07/2018  ZARI CLY September 09, 1946 [id number]  72 y.o. year old female referred to [facility] for Medication Management (Initial pharmacy outreach) and Medication Assistance (Proventil) Referred by primary care provider, Dr. Merrilyn Puma regarding medication assistance for Proventil.  Patient with [name] Advantage plan.   Patient confirms identity with HIPAA-identifiers x2. Patient scheduled a home visit for later this week.   Charlett Lango, PharmD Clinical Pharmacist, [facility] [phone]

## 2018-01-09 ENCOUNTER — Ambulatory Visit: Payer: Self-pay | Admitting: Pharmacist

## 2018-01-09 ENCOUNTER — Encounter: Payer: Self-pay | Admitting: *Deleted

## 2018-01-09 ENCOUNTER — Other Ambulatory Visit: Payer: Self-pay | Admitting: Pharmacist

## 2018-01-09 NOTE — Patient Outreach (Signed)
[facility]) Care Management  [place]   01/09/2018  Kendra [name] July 16, 1946 [id number]  Subjective: 72 y.o. year old female referred to [facility] for Medication Adherence (Initial pharmacy visit) and Medication Assistance (Proventil) Referred by primary care provider, Dr. [name], DNP.   PMH s/f: COPD, type 2 diabetes mellitus, neuropathy, osteoarthritis, depression, dyslipidemia, osteopenia, history of CVA  Patient with  [name] Advantage plan.   Patient confirms identity with HIPAA-identifiers x2 and gives verbal and written consent to [employer] and speak about medications. Consent will be uploaded under Media tab.  Medication Adherence: Patient reports no issues with adherence, but a review of medications shows discrepancies in fill history. After some questioning, patient states that sometimes she gets busy and forgets to take her medications. She reports 100% compliance with inhalers (Proventil, Symbicort).   Medication Assistance:  Patient has full Extra Help with Medicare. She reports difficulty obtaining inhalers from current preferred pharmacy.   Medication Management:  Patient manages her medications herself. Her pharmacy, [facility], packages her medications into bubble packs with date, time, and what medications printed on each pack. Patient states she sometimes has difficulty obtaining test strips for her glucometer, but she will be getting a CGM soon. Currently, report her fasting BG (when patient first wakes up) as 120-140 mg/dL. Does not have a home BP monitor and reports she sometimes gets dizzy if she stands up too fast.  Objective:   Encounter Medications: Outpatient Encounter Medications as of 01/09/2018  Medication Sig Note  . ACCU-CHEK SOFTCLIX LANCETS lancets Use One time daily as instructed   . albuterol (PROVENTIL HFA;VENTOLIN HFA) 108 (90 Base) MCG/ACT inhaler Inhale 2 puffs into the lungs every 6  (six) hours as needed for wheezing or shortness of breath.   [name] aspirin 81 MG chewable tablet Chew 81 mg by mouth daily.   [name] atorvastatin (LIPITOR) 40 MG tablet Take 1 tablet (40 mg total) by mouth daily. In place of Pravastatin   . budesonide-formoterol (SYMBICORT) 160-4.5 MCG/ACT inhaler USE 2 PUFFS BY INHALATION  TWO TIMES DAILY   . citalopram (CELEXA) 20 MG tablet Take 1 tablet (20 mg total) by mouth daily.   [name] gabapentin (NEURONTIN) 300 MG capsule Take one tablet three times during the day.  Take 2 tablets at bedtime.   [name] glucose blood (ACCU-CHEK AVIVA PLUS) test strip CHECK BLOOD SUGAR FOUR TIMES DAILY 01/09/2018: Need some test strips  . Insulin Glargine (LANTUS SOLOSTAR) 100 UNIT/ML Solostar Pen INJECT  22 UNITS SUBCUTANEOUSLY EVERY DAY  AT  10PM   . Insulin Pen Needle (BD PEN NEEDLE NANO U/F) 32G X 4 MM MISC USE WITH LANTUS AS DIRECTED   . lisinopril (PRINIVIL,ZESTRIL) 2.5 MG tablet Take 1 tablet (2.5 mg total) by mouth daily.   . meloxicam (MOBIC) 15 MG tablet Take 1 tablet (15 mg total) by mouth daily.   . Vitamin D, Ergocalciferol, (DRISDOL) 50000 units CAPS capsule Take 1 capsule (50,000 Units total) by mouth once a week. 01/09/2018: Monday  . acetaminophen (TYLENOL) 500 MG tablet Take 1 tablet (500 mg total) by mouth every 8 (eight) hours as needed. (Patient not taking: Reported on 06/08/2017)   . baclofen (LIORESAL) 10 MG tablet Take 1 tablet (10 mg total) by mouth 3 (three) times daily as needed for muscle spasms. (Patient not taking: Reported on 01/09/2018)   . Blood Glucose Monitoring Suppl (ACCU-CHEK AVIVA PLUS) w/Device KIT CHECK BLOOD SUGAR ONE TIME DAILY   . lidocaine (  XYLOCAINE) 5 % ointment Apply 1 application topically 4 (four) times daily as needed. (Patient not taking: Reported on 01/09/2018)   . [DISCONTINUED] budesonide-formoterol (SYMBICORT) 160-4.5 MCG/ACT inhaler Inhale 2 puffs into the lungs 2 (two) times daily.    No facility-administered encounter medications on file as  of 01/09/2018.     Functional Status: In your present state of health, do you have any difficulty performing the following activities: 01/07/2018  Hearing? N  Vision? N  Difficulty concentrating or making decisions? N  Walking or climbing stairs? N  Dressing or bathing? N  Doing errands, shopping? Y  Comment needs Agricultural engineer and eating ? Y  Comment son helps  Using the Toilet? N  In the past six months, have you accidently leaked urine? Y  Do you have problems with loss of bowel control? N  Managing your Medications? N  Managing your Finances? N  Housekeeping or managing your Housekeeping? N  Some recent data might be hidden    Fall/Depression Screening: Fall Risk  01/07/2018 09/29/2016 07/26/2016  Falls in the past year? No Yes Yes  Number falls in past yr: - 2 or more 1  Injury with Fall? - Yes Yes  Comment - - Injured her hips  Follow up - - -   PHQ 2/9 Scores 01/04/2018 06/08/2017 03/30/2017 09/29/2016 07/26/2016 01/18/2016 09/29/2015  PHQ - 2 Score _0 0 6 0 0  PHQ- 9 Score _1 - 17 - -   Assessment:  Drugs sorted by system:  Neurologic/Psychologic: citalopram, gabapentin  Cardiovascular: aspirin, lisinopril, atorvastatin  Pulmonary/Allergy: Proventil, Symbicort  Endocrine: insulin glargine  Pain: meloxicam  Vitamins/Minerals: ergocalciferol  Drug interactions: none of clinical significance Other issues noted: patient reports she needs pull up depends. The adult diapers she ordered are too small. She thinks she ordered them herself without prescription from [facility].   Plan: 1. Patient already is subsidized on prescription costs through Medicare Extra Help. Unfortunately, this would disqualify her from enrolling in any programs for inhalers, including Proventil. However, patient could switch to [employer] and receive all Tier 1 and Tier 2 generics for free. Patient is agreeable to this plan. [facility] pharmacist will provide pill tray to  patient so that she can continue with adherence aids similar to her current [facility] adherence packaging. Will contact prescribers regarding sending new prescriptions to [facility] and update patient's preferred pharmacy in chart.  2. Place order to [employer] for transportation assessment as patient expressed transportation needs in the course of home visit.  3. Contact primary care physician regarding a prescription for adult diapers to be sent to appropriate DME supply company.  4. Will follow up in 3-5 business days to ensure successful transfer to mail order pharmacy. Will close pharmacy case at that time if there is no further need.   Charlett Lango, PharmD Clinical Pharmacist, [facility] [phone]

## 2018-01-09 NOTE — Progress Notes (Signed)
This encounter was created in error - please disregard.

## 2018-01-11 ENCOUNTER — Other Ambulatory Visit: Payer: Self-pay | Admitting: *Deleted

## 2018-01-11 NOTE — Patient Outreach (Signed)
[facility]) Care Management   01/11/2018  KINZY WEYERS 11/19/1945 [id number]  Kendra [name] is an 72 y.o. female   Referral received 4/15 Referral source: PCP office, insurance referral  Referral reason; COPD,  Transportation needs, concern for paying more medication.  Insurance: [employer]  Chart reviewed PMHx includes Diabetes, COPD, Osteoarthritis, CVA   Subjective:  Patient discussed feeling tired often even after sleeping well at night.   Patient discussed she is unsure what her insurance plan is, states she has cards for Kendra [name], with [employer] being most recent.   Patient reports discomfort of right shoulder at times, states she is going to have doctor check it out.     Objective: BP 122/80 (BP Location: Right Arm, Patient Position: Sitting, Cuff Size: Normal)   Pulse 78   Resp 18   SpO2 95%  Review of Systems  Constitutional: Negative.   HENT: Negative.   Eyes: Negative.    Respiratory: Negative.   Cardiovascular: Negative.   Gastrointestinal: Negative.   Genitourinary: Negative.   Musculoskeletal: Negative.   Skin: Negative.   Endo/Heme/Allergies: Negative.     Physical Exam  Constitutional: She is oriented to person, place, and time. She appears well-developed and well-nourished.  Cardiovascular: Normal rate and normal heart sounds.  Respiratory: Effort normal and breath sounds normal.  GI: Soft.  Neurological: She is alert and oriented to person, place, and time.  Skin: Skin is warm and dry.  Psychiatric: She has a normal mood and affect. Her behavior is normal. Judgment and thought content normal.    Encounter Medications:   Outpatient Encounter Medications as of 01/11/2018  Medication Sig Note  . albuterol (PROVENTIL HFA;VENTOLIN HFA) 108 (90 Base) MCG/ACT inhaler Inhale 2 puffs into the lungs every 6 (six) hours as needed for wheezing or shortness of breath.   [name] aspirin 81 MG EC tablet Take 81 mg by mouth daily. Swallow  whole.   [name] atorvastatin (LIPITOR) 40 MG tablet Take 1 tablet (40 mg total) by mouth daily. In place of Pravastatin   . budesonide-formoterol (SYMBICORT) 160-4.5 MCG/ACT inhaler USE 2 PUFFS BY INHALATION  TWO TIMES DAILY   . citalopram (CELEXA) 20 MG tablet Take 1 tablet (20 mg total) by mouth daily.   [name] gabapentin (NEURONTIN) 300 MG capsule Take one tablet three times during the day.  Take 2 tablets at bedtime.   . Insulin Glargine (LANTUS SOLOSTAR) 100 UNIT/ML Solostar Pen INJECT  22 UNITS SUBCUTANEOUSLY EVERY DAY  AT  10PM   . Insulin Pen Needle (BD PEN NEEDLE NANO U/F) 32G X 4 MM MISC USE WITH LANTUS AS DIRECTED   . lisinopril (PRINIVIL,ZESTRIL) 2.5 MG tablet Take 1 tablet (2.5 mg total) by mouth daily.   . meloxicam (MOBIC) 15 MG tablet Take 1 tablet (15 mg total) by mouth daily.   . Vitamin D, Ergocalciferol, (DRISDOL) 50000 units CAPS capsule Take 1 capsule (50,000 Units total) by mouth once a week. 01/09/2018: Monday  . ACCU-CHEK SOFTCLIX LANCETS lancets Use One time daily as instructed (Patient not taking: Reported on 01/11/2018)   . acetaminophen (TYLENOL) 500 MG tablet Take 1 tablet (500 mg total) by mouth every 8 (eight) hours as needed. (Patient not taking: Reported on 06/08/2017)   . aspirin 81 MG chewable tablet Chew 81 mg by mouth daily.   . baclofen (LIORESAL) 10 MG tablet Take 1 tablet (10 mg total) by mouth 3 (three) times daily as needed for muscle spasms. (Patient not taking: Reported on  01/09/2018)   . Blood Glucose Monitoring Suppl (ACCU-CHEK AVIVA PLUS) w/Device KIT CHECK BLOOD SUGAR ONE TIME DAILY (Patient not taking: Reported on 01/11/2018)   . glucose blood (ACCU-CHEK AVIVA PLUS) test strip CHECK BLOOD SUGAR FOUR TIMES DAILY (Patient not taking: Reported on 01/11/2018) 01/09/2018: Need some test strips  . lidocaine (XYLOCAINE) 5 % ointment Apply 1 application topically 4 (four) times daily as needed. (Patient not taking: Reported on 01/09/2018)    No facility-administered  encounter medications on file as of 01/11/2018.     Functional Status:   In your present state of health, do you have any difficulty performing the following activities: 01/07/2018  Hearing? N  Vision? N  Difficulty concentrating or making decisions? N  Walking or climbing stairs? N  Dressing or bathing? N  Doing errands, shopping? Y  Comment needs Agricultural engineer and eating ? Y  Comment son helps  Using the Toilet? N  In the past six months, have you accidently leaked urine? Y  Do you have problems with loss of bowel control? N  Managing your Medications? N  Managing your Finances? N  Housekeeping or managing your Housekeeping? N  Some recent data might be hidden    Fall/Depression Screening:    Fall Risk  01/07/2018 09/29/2016 07/26/2016  Falls in the past year? No Yes Yes  Number falls in past yr: - 2 or more 1  Injury with Fall? - Yes Yes  Comment - - Injured her hips  Follow up - - -   PHQ 2/9 Scores 01/04/2018 06/08/2017 03/30/2017 09/29/2016 07/26/2016 01/18/2016 09/29/2015  PHQ - 2 Score '2 3 4 '$ 0 6 0 0  PHQ- 9 Score '7 15 20 '$ - 17 - -    Assessment:  Initial home visit , patient son is present in home as well as friend. Patient receiving multiple telephone calls during visit.    COPD Patient continues to smoke, will discuss nicotine patch with MD at visit.  Patient her breathing is at usual state, she has proair and symbicort available for use at home, report use of albuterol inhaler about once daily. Will benefit from continued education on COPD self care management.   Diabetes  Patient has Pro[employer] meter, no strips reports she received meter a few months ago but is unsure who sent meter to her. Asked patient about having Accu Check arriva on medication list ,  reports she may have had that in the past, when she was with [facility] insurance,  Does not recall getting strips from [facility].  She reports taking insulin daily , will benefit from continued education on Diabetes  management and care coordination with getting necessary supplies  Medications Patient currently getting  medication from [facility], reports easier for her to use due to difficulty with opening bottles at times. Patient seems unsure of whether she wants to continue with [facility] or switch to [facility] concern /PCP follow up  Patient discussed issues with transportation to medical appointments, reports she no longer has medicaid and the transportation benefit. Reviewed [employer] transportation benefit.   Plan 1.Provided and reviewed welcome packet, THN consent signed 2.Provided smoking cessation handout.  3.Will place call to PCP office regarding need for CBG meter and strips patient request to get  from [facility] due to cost benefit.  4.Will discuss patient concern with opening pill bottles and ease of [facility] and of her being  unsure which company she wants to get medication from  with  [facility] pharmacist Karolee Stamps, Pharm D 5..Pt placed call to PCP office to schedule office visit, assisted patient  with calling [employer] transportation to arrange ride to PCP visit on 5/2 at 0920. Contact number for [employer] transportation to arrange a ride and help number for "where is my ride, and return transportation number written down for patient . Verified with patient her [employer] plan is active, and on speaker phone assisted with  Call to [facility]  regarding patient question of her over the counter benefits, verified she has $50 per quarter , explained to patient time frame, provided contact number and requested to have OTC catalog sent to patient.  Will plan telephone visit in the next 2 weeks.   Toward end of  visit, loud argument outside patient apartment, patient checked outside door and stated it might be best if I leave now.     [facility] CM Care Plan Problem One     Most Recent Value  Care Plan Problem One  Knowledge related to COPD self care management   Role Documenting the Problem One   Care Management Coordinator  Care Plan for Problem One  Active  THN Long Term Goal   Patient will report increased knowledge of COPD self care managment in the next 31 days   Interventions for Problem One Long Term Goal  Advised regarding importance of taking medication as prescribed,   THN CM Short Term Goal #1   Patient will report attending PCP visit in the next 14 days   THN CM Short Term Goal #1 Start Date  01/11/18  Interventions for Short Term Goal #1  RN assist patient with scheduling PCP office visit and call to [name] transporation to arrange a ride, teachback to patient on how to contact for return ride , and time to be ready for ride pick up all instructions written down .     THN CM Short Term Goal #2   Over the next 30 days patient will be able to report 3 symptoms of worsening COPD and action plan  THN CM Short Term Goal #2 Start Date  01/11/18  Interventions for Short Term Goal #2  Provided Patient with [facility] COPD packet, magnet,. Reviewed zone chart information .       Joylene Draft, RN, [place] Management Coordinator  [phone]- Mobile [phone]- Toll Free Main Office

## 2018-01-14 ENCOUNTER — Ambulatory Visit: Payer: Self-pay | Admitting: Pharmacist

## 2018-01-16 ENCOUNTER — Other Ambulatory Visit: Payer: Self-pay | Admitting: Pharmacist

## 2018-01-16 ENCOUNTER — Other Ambulatory Visit: Payer: Self-pay | Admitting: *Deleted

## 2018-01-16 ENCOUNTER — Other Ambulatory Visit: Payer: Self-pay | Admitting: Nurse Practitioner

## 2018-01-16 DIAGNOSIS — E1142 Type 2 diabetes mellitus with diabetic polyneuropathy: Secondary | ICD-10-CM

## 2018-01-16 DIAGNOSIS — E118 Type 2 diabetes mellitus with unspecified complications: Secondary | ICD-10-CM

## 2018-01-16 MED ORDER — ACCU-CHEK SOFTCLIX LANCETS MISC: 4 refills | Status: DC

## 2018-01-16 MED ORDER — GLUCOSE BLOOD VI STRP
ORAL_STRIP | 4 refills | Status: DC
Start: 2018-01-16 — End: 2018-04-01

## 2018-01-16 MED ORDER — LANCET DEVICE WITH EJECTOR MISC: 1.0000 | Freq: Once | 0 refills | Status: AC

## 2018-01-16 MED ORDER — ACCU-CHEK AVIVA PLUS W/DEVICE KIT: PACK | 0 refills | Status: DC

## 2018-01-16 NOTE — Patient Outreach (Signed)
[facility]) Care Management  01/16/2018  Kendra [name] [birth year] [id number]  72 y.o. year old female referred to [facility] for Medication Management (Follow up- [facility] mail order) Referred by primary care provider, Dr. [name], DNP.   PMH s/f: COPD, type 2 diabetes mellitus, neuropathy, osteoarthritis, depression, dyslipidemia, osteopenia, history of CVA  Patient with [facility] Advantage plan.   Patient confirms identity with HIPAA-identifiers x2 and gives verbal consent to speak over the phone about medications.   Medication Management:  Patient states that she received a bill from [facility] her current preferred pharmacy. [facility] states they will not send her new medications until she pays her bill. Patient states she does plan on paying the bill ("my bills come first") but would still like to switch to [facility] due to the $0 copay on generic Tier 1 and Tier 2 medications.   Assessment/Plan: 1. Spoke with patient at length regarding the switch to [name] order pharmacy. Benefit would be the lower cost of medications. However, patient has previously stated that she has difficulty opening pill bottles. Informed patient she would need to request "easy open bottles" from [facility]. Patient verbalized understanding of the pros/cons of [employer] mail order and wishes to proceed with switching to [facility] mail order as preferred pharmacy.  2. Contact Dr. Audrea Muscat office to ask that all new prescriptions be sent to [facility] mail order pharmacy. Also requested that the prescriber include a note about easy open prescription bottles for the patient. Updated preferred pharmacy in patient's chart.  3. Will follow up in 3-4 business days to ensure successful transfer of medications. Will close pharmacy episode at that time if there are no further clinical pharmacy needs.   Charlett Lango, PharmD Clinical Pharmacist, [facility] [phone]

## 2018-01-16 NOTE — Patient Outreach (Signed)
[facility]) Care Management  01/16/2018  Kendra [name] 01/18/1946 [id number]   Telephone outreach call to patient, to remind of scheduled PCP visit on 5/2, HIPAA verified.  Patient states she is planning to attend PCP visit, discussed transportation, patient is able to read back [employer] contact number . Patient able to state time of her appointment 5/2 at 9:20 patient is able to recall from written information that  transportation will pick her up at 8:30 and she is able to read contact number for return ride or to question where ride is.    Plan  Will plan return follow up call in the next 2 weeks.    Joylene Draft, RN, [place] Management Coordinator  [phone]- Mobile [phone]- Toll Free Main Office

## 2018-01-17 ENCOUNTER — Encounter: Payer: Self-pay | Admitting: Nurse Practitioner

## 2018-01-17 ENCOUNTER — Ambulatory Visit (INDEPENDENT_AMBULATORY_CARE_PROVIDER_SITE_OTHER): Payer: Self-pay | Admitting: Nurse Practitioner

## 2018-01-17 ENCOUNTER — Other Ambulatory Visit: Payer: Self-pay

## 2018-01-17 VITALS — BP 138/79 | HR 67 | Temp 98.2°F | Ht 64.0 in | Wt 162.8 lb

## 2018-01-17 DIAGNOSIS — E785 Hyperlipidemia, unspecified: Secondary | ICD-10-CM

## 2018-01-17 DIAGNOSIS — E118 Type 2 diabetes mellitus with unspecified complications: Secondary | ICD-10-CM | POA: Diagnosis not present

## 2018-01-17 DIAGNOSIS — F331 Major depressive disorder, recurrent, moderate: Secondary | ICD-10-CM

## 2018-01-17 DIAGNOSIS — Z79899 Other long term (current) drug therapy: Secondary | ICD-10-CM | POA: Diagnosis not present

## 2018-01-17 DIAGNOSIS — R809 Proteinuria, unspecified: Secondary | ICD-10-CM

## 2018-01-17 DIAGNOSIS — E1129 Type 2 diabetes mellitus with other diabetic kidney complication: Secondary | ICD-10-CM

## 2018-01-17 DIAGNOSIS — E1142 Type 2 diabetes mellitus with diabetic polyneuropathy: Secondary | ICD-10-CM

## 2018-01-17 DIAGNOSIS — J449 Chronic obstructive pulmonary disease, unspecified: Secondary | ICD-10-CM

## 2018-01-17 DIAGNOSIS — M19011 Primary osteoarthritis, right shoulder: Secondary | ICD-10-CM

## 2018-01-17 DIAGNOSIS — R6889 Other general symptoms and signs: Secondary | ICD-10-CM | POA: Diagnosis not present

## 2018-01-17 DIAGNOSIS — E559 Vitamin D deficiency, unspecified: Secondary | ICD-10-CM

## 2018-01-17 DIAGNOSIS — M25511 Pain in right shoulder: Secondary | ICD-10-CM

## 2018-01-17 LAB — LIPID PANEL
Cholesterol: 121 mg/dL (ref <200)
HDL: 57 mg/dL (ref >50)
LDL Cholesterol (Calc): 48 mg/dL (calc)
Non-HDL Cholesterol (Calc): 64 mg/dL (calc) (ref <130)
Total CHOL/HDL Ratio: 2.1 (calc) (ref <5.0)
Triglycerides: 77 mg/dL (ref <150)

## 2018-01-17 LAB — COMPLETE METABOLIC PANEL WITH GFR
AG Ratio: 1.7 (calc) (ref 1.0–2.5)
ALT: 24 U/L (ref 6–29)
AST: 16 U/L (ref 10–35)
Albumin: 4.3 g/dL (ref 3.6–5.1)
Alkaline phosphatase (APISO): 87 U/L (ref 33–130)
BUN: 17 mg/dL (ref 7–25)
CO2: 28 mmol/L (ref 20–32)
Calcium: 9.4 mg/dL (ref 8.6–10.4)
Chloride: 106 mmol/L (ref 98–110)
Creat: 0.94 mg/dL (ref 0.50–0.99)
GFR, Est African American: 72 mL/min/{1.73_m2} (ref >=60)
GFR, Est Non African American: 62 mL/min/{1.73_m2} (ref >=60)
Globulin: 2.5 g/dL (calc) (ref 1.9–3.7)
Glucose, Bld: 152 mg/dL — ABNORMAL HIGH (ref 65–99)
Potassium: 4.8 mmol/L (ref 3.5–5.3)
Sodium: 140 mmol/L (ref 135–146)
Total Bilirubin: 0.3 mg/dL (ref 0.2–1.2)
Total Protein: 6.8 g/dL (ref 6.1–8.1)

## 2018-01-17 LAB — POCT GLYCOSYLATED HEMOGLOBIN (HGB A1C): Hemoglobin A1C: 7.8

## 2018-01-17 MED ORDER — CITALOPRAM HYDROBROMIDE 20 MG PO TABS
20.0000 mg | ORAL_TABLET | Freq: Every day | ORAL | 1 refills | Status: DC
Start: 2018-01-17 — End: 2018-09-30

## 2018-01-17 MED ORDER — GABAPENTIN 300 MG PO CAPS: ORAL_CAPSULE | ORAL | 1 refills | Status: DC

## 2018-01-17 MED ORDER — ATORVASTATIN CALCIUM 40 MG PO TABS: 40.0000 mg | ORAL_TABLET | Freq: Every day | ORAL | 1 refills | Status: DC

## 2018-01-17 MED ORDER — VITAMIN D (ERGOCALCIFEROL) 1.25 MG (50000 UNIT) PO CAPS: ORAL_CAPSULE | ORAL | 0 refills | Status: DC

## 2018-01-17 MED ORDER — BACLOFEN 10 MG PO TABS: 10.0000 mg | ORAL_TABLET | Freq: Every evening | ORAL | 6 refills | Status: DC | PRN

## 2018-01-17 MED ORDER — INSULIN PEN NEEDLE 32G X 4 MM MISC: 2 refills | Status: DC

## 2018-01-17 MED ORDER — LISINOPRIL 2.5 MG PO TABS: 2.5000 mg | ORAL_TABLET | Freq: Every day | ORAL | 3 refills | Status: DC

## 2018-01-17 MED ORDER — ALBUTEROL SULFATE HFA 108 (90 BASE) MCG/ACT IN AERS: 2.0000 | INHALATION_SPRAY | Freq: Four times a day (QID) | RESPIRATORY_TRACT | 5 refills | Status: DC | PRN

## 2018-01-17 MED ORDER — INSULIN GLARGINE 100 UNIT/ML SOLOSTAR PEN: PEN_INJECTOR | SUBCUTANEOUS | 2 refills | Status: DC

## 2018-01-17 MED ORDER — ASPIRIN 81 MG PO TBEC: 81.0000 mg | DELAYED_RELEASE_TABLET | Freq: Every day | ORAL | 3 refills | Status: DC

## 2018-01-17 MED ORDER — BUDESONIDE-FORMOTEROL FUMARATE 160-4.5 MCG/ACT IN AERO: INHALATION_SPRAY | RESPIRATORY_TRACT | 3 refills | Status: DC

## 2018-01-17 MED ORDER — MELOXICAM 15 MG PO TABS: 15.0000 mg | ORAL_TABLET | Freq: Every day | ORAL | 5 refills | Status: DC

## 2018-01-17 MED ORDER — GLIPIZIDE 10 MG PO TABS: 10.0000 mg | ORAL_TABLET | Freq: Every day | ORAL | 1 refills | Status: DC

## 2018-01-17 NOTE — Patient Instructions (Addendum)
Kendra [name],   Thank you for coming in to clinic today.  1. When people call you to offer products or services including insurance changes, you do not have to say yes.  2. [employer] pharmacy is the only pharmacy you should continue to use this year.  3. For diabetes: - Restart all of your old medicines. - START new glimepiride 10 mg once daily with breakfast.  4. I have ordered [facility] home health for physical therapy.  They will call you.  5. I have also referred you to orthopedic surgery.  [facility] or [facility] will call to schedule.  Your provider would like to you have your annual eye exam. Please contact your current eye doctor or here are some good options for you to contact.   Let them know you have diabetes and you need your diabetic eye exam.  [facility]   Address: [address] [place], [place] [address] Phone: [phone]  Website: [email or web address] [address] [place], [place] [address] Phone: [phone] [email or web address]  [facility]  Address: [place], [place], [place] [address] Phone: [phone]   [facility] [address] [place], [place] [address] Phone: [phone]  [facility] Address: [place], [place], [place] [address]  Phone: [phone]    Please schedule a follow-up appointment with [name], AGNP. Return in about 3 months (around 04/19/2018) for diabetes.  If you have any other questions or concerns, please feel free to call the clinic or send a message through [place]. You may also schedule an earlier appointment if necessary.  You will receive a survey after today's visit either digitally by e-mail or paper by [employer]. Your experiences and feedback matter to [place].  Please respond so we know how we are doing as we provide care for you.   [name], DNP, AGNP-BC Adult Gerontology Nurse Practitioner [place]

## 2018-01-17 NOTE — Progress Notes (Signed)
Subjective:    Patient ID: Kendra [name], female    DOB: 20-Sep-1945, 72 y.o.   MRN: [id number]  Kendra [name] is a 72 y.o. female presenting on 01/17/2018 for Diabetes and Shoulder Pain (chronic R shoulder & neck pain)   HPI Difficulty with navigating healthcare resources Pt has previously been referred to [facility] for assistance in community resources as well as disease-specific information.  Pt has requested all med refills to [facility] with easy open caps for affordability while still being able to open pill bottles.  RN with [facility] has also requested additional meter supplies and meter for pt to be ordered as she has no way to check her CBG.  Diabetes Pt presents today for follow up of Type 2 diabetes mellitus. She is not checking CBG at home - Current diabetic medications include: lantus only.  Is continuing to use lantus once daily. - She is symptomatic with polyphagia, polydipsia,polyuria.  - She denies headaches, diaphoresis, shakiness, chills, pain, numbness or tingling in extremities and changes in vision.   - Clinical course has been worsening. - She  reports no regular exercise routine. - Her diet is moderate in salt, moderate in fat, and high in carbohydrates. - Weight trend: increasing steadily  PREVENTION: Eye exam current (within one year): no Foot exam current (within one year): yes  Lipid/ASCVD risk reduction - on statin: yes Kidney protection - on ace or arb: yes Recent Labs    06/08/17 1010 01/17/18 1011  HGBA1C 6.6 7.8   Depression Pt notes she no longer has her depression pills.  She hasn't taken them for several days.  She notes she worries more, can't calm down.  Cant get worries off her mind even with walking.  Has been out of citalopram because "a nurse came to the house and took the medication."  She requests to restart them.  Chronic R shoulder pain Causing insomnia. Fell with ministrokes.  Had gone to ER.  Xrays negative.  She has had persistent R shoulder  pain since that time.  Intraarticular injection to R subacromial joint space relieved pain for only a few days. She would like to proceed with treatment.  Social History   Tobacco Use  . Smoking status: Current Some Day Smoker    Packs/day: 0.25    Types: Cigarettes  . Smokeless tobacco: Never Used  Substance Use Topics  . Alcohol use: No  . Drug use: No    Review of Systems Per HPI unless specifically indicated above     Objective:    BP 138/79 (BP Location: Right Arm, Patient Position: Sitting, Cuff Size: Normal)   Pulse 67   Temp 98.2 F (36.8 C) (Oral)   Ht 5\' 4"  (1.626 m)   Wt 162 lb 12.8 oz (73.8 kg)   BMI 27.94 kg/m   Wt Readings from Last 3 Encounters:  01/17/18 162 lb 12.8 oz (73.8 kg)  06/08/17 150 lb 3.2 oz (68.1 kg)  03/30/17 150 lb 3.2 oz (68.1 kg)    Physical Exam  Constitutional: She is oriented to person, place, and time. She appears well-developed and well-nourished. No distress.  Neck: Normal range of motion. Neck supple. Carotid bruit is not present.  Cardiovascular: Normal rate, regular rhythm, S1 normal, S2 normal, normal heart sounds and intact distal pulses.  Pulmonary/Chest: Effort normal and breath sounds normal. No respiratory distress.  Musculoskeletal: She exhibits no edema (pedal).  Right Shoulder Inspection: Normal appearance bilateral symmetrical Palpation: Non-tender to palpation over anterior, lateral,  or posterior shoulder  ROM: Full intact active ROM forward flexion, abduction, internal / external rotation, symmetrical Special Testing: Rotator cuff testing positive for pain and weakness with supraspinatus full can test.  Negative pain, but positive weakness with empty can test.  O'brien's negative for labral pain, Hawkin's AC impingement positive for pain. Strength: Normal strength 5/5 flex/ext, ext rot / int rot, grip, rotator cuff str testing. Neurovascular: Distally intact pulses, sensation to light touch   Neurological: She is alert  and oriented to person, place, and time.  Skin: Skin is warm and dry.  Psychiatric: She has a normal mood and affect. Her behavior is normal.  Vitals reviewed.   Results for orders placed or performed in visit on 01/17/18  POCT glycosylated hemoglobin (Hb A1C)  Result Value Ref Range   Hemoglobin A1C 7.8       Assessment & Plan:   Problem List Items Addressed This Visit      Respiratory   COPD, moderate (HCC)   Relevant Medications   budesonide-formoterol (SYMBICORT) 160-4.5 MCG/ACT inhaler   albuterol (PROVENTIL HFA;VENTOLIN HFA) 108 (90 Base) MCG/ACT inhaler     Endocrine   Diabetic peripheral neuropathy associated with type 2 diabetes mellitus (HCC)   Relevant Medications   glipiZIDE (GLUCOTROL) 10 MG tablet   citalopram (CELEXA) 20 MG tablet   baclofen (LIORESAL) 10 MG tablet   lisinopril (PRINIVIL,ZESTRIL) 2.5 MG tablet   Insulin Pen Needle (BD PEN NEEDLE NANO U/F) 32G X 4 MM MISC   Insulin Glargine (LANTUS SOLOSTAR) 100 UNIT/ML Solostar Pen   gabapentin (NEURONTIN) 300 MG capsule   atorvastatin (LIPITOR) 40 MG tablet   aspirin 81 MG EC tablet   Diabetes mellitus type 2, controlled, with complications (HCC) - Primary   Relevant Medications   glipiZIDE (GLUCOTROL) 10 MG tablet   lisinopril (PRINIVIL,ZESTRIL) 2.5 MG tablet   Insulin Glargine (LANTUS SOLOSTAR) 100 UNIT/ML Solostar Pen   atorvastatin (LIPITOR) 40 MG tablet   aspirin 81 MG EC tablet   Other Relevant Orders   POCT glycosylated hemoglobin (Hb A1C) (Completed)   Lipid panel   COMPLETE METABOLIC PANEL WITH GFR     Musculoskeletal and Integument   Osteoarthritis   Relevant Medications   baclofen (LIORESAL) 10 MG tablet   meloxicam (MOBIC) 15 MG tablet   aspirin 81 MG EC tablet   Other Relevant Orders   AMB referral to orthopedics   Ambulatory referral to [facility]     Other   Clinical depression   Relevant Medications   citalopram (CELEXA) 20 MG tablet   Dyslipidemia   Relevant Medications    atorvastatin (LIPITOR) 40 MG tablet   aspirin 81 MG EC tablet   Vitamin D deficiency   Relevant Medications   Vitamin D, Ergocalciferol, (DRISDOL) 50000 units CAPS capsule    Other Visit Diagnoses    Acute pain of right shoulder       Pt w/ pain lasting about 1 month w/ limitation of AROM.  Pt has has adequate conservative therapy prior to visit.   Relevant Medications   baclofen (LIORESAL) 10 MG tablet   meloxicam (MOBIC) 15 MG tablet   Microalbuminuria due to type 2 diabetes mellitus ([place])       See AP for DMT2   Relevant Medications   glipiZIDE (GLUCOTROL) 10 MG tablet   lisinopril (PRINIVIL,ZESTRIL) 2.5 MG tablet   Insulin Glargine (LANTUS SOLOSTAR) 100 UNIT/ML Solostar Pen   atorvastatin (LIPITOR) 40 MG tablet   aspirin 81 MG EC tablet  Encounter for long-term (current) use of high-risk medication       Relevant Orders   COMPLETE METABOLIC PANEL WITH GFR     #1 UncontrolledDM with A1c 7.4% and  Worsening control from last A1c. Goal A1c < 7.0%. - Complications - peripheral neuropathy and hyperglycemia.  Plan:  1. Continue lantus without changes  - START glimepiride 10 mg once daily 2. Encourage improved lifestyle: - low carb/low glycemic diet reinforced prior education - Increase physical activity to 30 minutes most days of the week.  Explained that increased physical activity increases body's use of sugar for energy. 3. Check fasting am CBG.  Bring log to next visit for review 4. Continue ASA, ACEi and Statin 5. Advised to schedule DM ophtho exam, send record. 6. Follow-up 3 months     #2 R shoulder pain most consistent with rotator cuff injury vs arthritis.  Plan: 1. START home health PT 2. Referral Orthopedics ([facility] or [facility])  Pt states she has outstanding balance at one, but cannot remember which.  She can go to either location. 3. Continue current medications.  #3 Healthcare system navigation: as assisted by [facility] Pharm[facility], medications are all  reviewed and refilled to [facility] for easy-open bottles.  Meds ordered this encounter  Medications  . glipiZIDE (GLUCOTROL) 10 MG tablet    Sig: Take 1 tablet (10 mg total) by mouth daily before breakfast.    Dispense:  90 tablet    Refill:  1    Always use easy open prescription bottles.    Order Specific Question:   Supervising Provider    Answer:   Olin Hauser [[id number]]  . citalopram (CELEXA) 20 MG tablet    Sig: Take 1 tablet (20 mg total) by mouth daily.    Dispense:  90 tablet    Refill:  1    Always use easy open prescription bottles.    Order Specific Question:   Supervising Provider    Answer:   Olin Hauser [[id number]]  . baclofen (LIORESAL) 10 MG tablet    Sig: Take 1 tablet (10 mg total) by mouth at bedtime as needed for muscle spasms.    Dispense:  30 each    Refill:  6    Always use easy open prescription bottles.    Order Specific Question:   Supervising Provider    Answer:   Olin Hauser [[id number]]  . Vitamin D, Ergocalciferol, (DRISDOL) 50000 units CAPS capsule    Sig: Take 1 capsule (50,000 Units total) by mouth once a week.    Dispense:  12 capsule    Refill:  0    Always use easy open prescription bottles.    Order Specific Question:   Supervising Provider    Answer:   Olin Hauser [[id number]]  . meloxicam (MOBIC) 15 MG tablet    Sig: Take 1 tablet (15 mg total) by mouth daily.    Dispense:  30 tablet    Refill:  5    Always use easy open prescription bottles.    Order Specific Question:   Supervising Provider    Answer:   Olin Hauser [[id number]]  . lisinopril (PRINIVIL,ZESTRIL) 2.5 MG tablet    Sig: Take 1 tablet (2.5 mg total) by mouth daily.    Dispense:  90 tablet    Refill:  3    Always use easy open prescription bottles.    Order Specific Question:   Supervising Provider    Answer:  KARAMALEGOS, ALEXANDER J [[id number]]  . Insulin Pen Needle (BD PEN NEEDLE NANO U/F) 32G X 4 MM MISC    Sig: USE WITH  LANTUS AS DIRECTED    Dispense:  90 each    Refill:  2    Always use easy open prescription bottles.    Order Specific Question:   Supervising Provider    Answer:   Olin Hauser [[id number]]  . Insulin Glargine (LANTUS SOLOSTAR) 100 UNIT/ML Solostar Pen    Sig: INJECT  22 UNITS SUBCUTANEOUSLY EVERY DAY  AT  10PM    Dispense:  8 pen    Refill:  2    Always use easy open prescription bottles.    Order Specific Question:   Supervising Provider    Answer:   Olin Hauser [[id number]]  . gabapentin (NEURONTIN) 300 MG capsule    Sig: Take one tablet three times during the day.  Take 2 tablets at bedtime.    Dispense:  450 capsule    Refill:  1    Always use easy open prescription bottles.    Order Specific Question:   Supervising Provider    Answer:   Olin Hauser [[id number]]  . budesonide-formoterol (SYMBICORT) 160-4.5 MCG/ACT inhaler    Sig: USE 2 PUFFS BY INHALATION  TWO TIMES DAILY    Dispense:  30.6 g    Refill:  3    Always use easy open prescription bottles.    Order Specific Question:   Supervising Provider    Answer:   Olin Hauser [[id number]]  . atorvastatin (LIPITOR) 40 MG tablet    Sig: Take 1 tablet (40 mg total) by mouth daily. In place of Pravastatin    Dispense:  90 tablet    Refill:  1    Always use easy open prescription bottles.    Order Specific Question:   Supervising Provider    Answer:   Olin Hauser [[id number]]  . aspirin 81 MG EC tablet    Sig: Take 1 tablet (81 mg total) by mouth daily. Swallow whole.    Dispense:  90 tablet    Refill:  3    Always use easy open prescription bottles.    Order Specific Question:   Supervising Provider    Answer:   Olin Hauser [[id number]]  . albuterol (PROVENTIL HFA;VENTOLIN HFA) 108 (90 Base) MCG/ACT inhaler    Sig: Inhale 2 puffs into the lungs every 6 (six) hours as needed for wheezing or shortness of breath.    Dispense:  1 Inhaler    Refill:  5    Product selection permitted  for insurance preference for best coverage and least cost to patient.    Order Specific Question:   Supervising Provider    Answer:   Olin Hauser [[id number]]    Follow up plan: Return in about 3 months (around 04/19/2018) for diabetes.  [name], DNP, AGPCNP-BC Adult Gerontology Primary Care Nurse Practitioner [facility] 01/17/2018, 1:33 PM

## 2018-01-18 ENCOUNTER — Other Ambulatory Visit: Payer: Self-pay

## 2018-01-18 ENCOUNTER — Other Ambulatory Visit: Payer: Self-pay | Admitting: Pharmacist

## 2018-01-18 NOTE — Patient Outreach (Signed)
[facility]) Care Management  01/18/2018  Kendra [name] 1946-04-01 [id number]   BSW contacted patient to follow up on social work referral for transportation needs. HIPAA identifiers verified. Unfortunately, there are no public transportation options that service patients home address. Patient does have transportation benefits through [place].   Patient was educated on [place] transportation benefits by RN CM, [name] on 01/11/18. Patient confirmed she understands her transportation benefits through insurance. Patient successfully used this benefit on 01/17/18. Patient states she has 12 one-way trips per year with this benefit. At this time, patient has used 2 of her trips. Patient aware she has 10 one-way tips left for this calendar year.  BSW spoke with patient about her normal amount of physician visits in a year and inquired if she normally would be seen more than six times per year. Patient denies large amount of physician needs. Patients next physician appointment is August 6th. Patient plans to utilize her [employer] benefit for upcomming appointment in August.  BSW informed patient that her social work episode would be resolved due to patients understanding of her transportation benefit. BSW spoke with patient about self referral if other social work needs arise or if she runs out of transportation benefits and needs assistance to future appointments. Patient agrees with this option for future needs. BSW provided patient with contact information.  Plan: BSW to mail patient information about the process of scheduling transportation through [facility] for future needs.  BSW to mail patient this BSW's business card for patient to keep on file for possible future social work needs. BSW will resolve social work episode due to no other active social work needs at this time.  [name], BSW, CDP Social Worker Cell # [phone] [name]

## 2018-01-18 NOTE — Patient Outreach (Signed)
[place] [facility]) Care Management  01/18/2018  Kendra [name] 20-Oct-1945 [id number]   72 y.o. year old female referred to [facility] for Case Closure (pharmacy episode closure) Referred by primary care provider, Dr. [name], DNP.  Patient with [facility] Advantage plan.   Patient confirms identity with HIPAA-identifiers x2 and gives verbal consent to speak over the phone about medications.   Spoke with patient following her appointment with Dr. Merrilyn Puma yesterday. Dr. Merrilyn Puma has sent in all prescriptions, including a new prescription for glimepiride, to [facility]. All prescriptions written for easy open caps as requested by patient.   Counseled the patient again regarding medication adherence, the reasons why she decided to use [employer], and about new prescription glimepiride. Patient states she did not want a fingerprick glucometer, but rather the "kind you stick to your arm" (CGM- Freestyle Libre).  All clinical pharmacy services needs have been met. Will close pharmacy episode at this time. Patient verbalized understanding. Provided patient with contact information should she have any pharmacy related needs in the future.   Charlett Lango, PharmD Clinical Pharmacist, [facility] [phone]

## 2018-01-21 ENCOUNTER — Ambulatory Visit: Payer: Self-pay | Admitting: Pharmacist

## 2018-01-23 ENCOUNTER — Telehealth: Payer: Self-pay | Admitting: Nurse Practitioner

## 2018-01-23 DIAGNOSIS — M19011 Primary osteoarthritis, right shoulder: Secondary | ICD-10-CM

## 2018-01-23 DIAGNOSIS — M25511 Pain in right shoulder: Secondary | ICD-10-CM

## 2018-01-23 NOTE — Telephone Encounter (Signed)
patient preferred [facility] outpatient PT.

## 2018-01-23 NOTE — Telephone Encounter (Signed)
Terra with [facility] said pt was not qualified for home health PT because she is not homebound but does need outpatient PT for shoulder.  Pt requested going to [facility].  Terra's call back number is [phone]

## 2018-01-23 NOTE — Telephone Encounter (Signed)
Pt prefers [facility].  Order placed.

## 2018-01-23 NOTE — Telephone Encounter (Signed)
[name] does not [facility].  This will not be the best fit for [name].  I recommend she go to [name] or [facility].  She can arrange transportation if needed to get there.  Because it is for medical purposes, her medicare transport will take her.  Paper order written.  Can send electronic order as needed to [facility]

## 2018-01-24 ENCOUNTER — Other Ambulatory Visit: Payer: Self-pay | Admitting: *Deleted

## 2018-01-24 ENCOUNTER — Encounter: Payer: Self-pay | Admitting: *Deleted

## 2018-01-24 NOTE — Patient Outreach (Signed)
[facility]) Care Management  01/24/2018  BELKIS NORBECK Oct 15, 1945 [id number]   Telephone follow up call   Placed call to patient , HIPAA verified. Patient discussed feeling better on today, she shared that she is waiting a call regarding getting therapy on shoulder at [facility]. Discussed transportation needs, patient states she will use [employer] service as she did going to MD appointment . Reinforced with patient , limits on rides available per year, she understands that she has 10 one way trips left for this year.    Patient discussed that she has all of her medications, new delivery from [facility] mail order on yesterday, patient reports being able to manage her daily medications taking from the bottle . Patient reports she has not received her new meter yet, but has received a call from [facility] stating it is on the way. Encouraged patient to notify RN if meter arrived before next visit, and she needed help with using meter.   Patient reports her breathing is good in usual state, using Symbicort as prescribed and rescue inhaler as needed, no increase in shortness of breath or yellow zone symptoms reviewed.   Patient requesting help with getting a blood pressure monitor for use at home.   Plan  Will plan follow up home visit in the next 3 weeks. Will assess [facility] resources for assistance with blood pressure monitor and provide to patient by next visit.     Joylene Draft, RN, [place] Management Coordinator  [phone]- Mobile [phone]- Toll Free Main Office

## 2018-01-29 ENCOUNTER — Other Ambulatory Visit: Payer: Self-pay | Admitting: Nurse Practitioner

## 2018-01-29 DIAGNOSIS — E1142 Type 2 diabetes mellitus with diabetic polyneuropathy: Secondary | ICD-10-CM

## 2018-01-29 MED ORDER — INSULIN GLARGINE 100 UNIT/ML SOLOSTAR PEN: PEN_INJECTOR | SUBCUTANEOUS | 2 refills | Status: DC

## 2018-01-31 ENCOUNTER — Other Ambulatory Visit: Payer: Self-pay | Admitting: Nurse Practitioner

## 2018-01-31 DIAGNOSIS — J449 Chronic obstructive pulmonary disease, unspecified: Secondary | ICD-10-CM

## 2018-01-31 MED ORDER — ALBUTEROL SULFATE HFA 108 (90 BASE) MCG/ACT IN AERS: 1.0000 | INHALATION_SPRAY | Freq: Four times a day (QID) | RESPIRATORY_TRACT | 3 refills | Status: DC | PRN

## 2018-02-07 ENCOUNTER — Telehealth: Payer: Self-pay | Admitting: Nurse Practitioner

## 2018-02-07 DIAGNOSIS — E118 Type 2 diabetes mellitus with unspecified complications: Secondary | ICD-10-CM

## 2018-02-07 MED ORDER — GLIPIZIDE 10 MG PO TABS: 10.0000 mg | ORAL_TABLET | Freq: Every day | ORAL | 1 refills | Status: DC

## 2018-02-07 NOTE — Telephone Encounter (Signed)
Sick with dizziness and weakness yesterday.  Took today also, but did not have symptoms.   Pt was advised to take again tomorrow and call if any symptoms with future doses.  Also advised that if dizziness and weakness occurs again, she should call clinic and consider coming for a visit to evaluate the dizziness. Pt verbalized understanding.

## 2018-02-07 NOTE — Telephone Encounter (Signed)
Pt. Called states that her medicine glipizide was making her sick in the morning wanted to know what she needed to do.Pt call back  # Is [phone]

## 2018-02-08 ENCOUNTER — Other Ambulatory Visit: Payer: Self-pay | Admitting: *Deleted

## 2018-02-08 NOTE — Patient Outreach (Signed)
[name]) Care Management  02/08/2018  Kendra [name] 1946-04-13 [id number]  Telephone follow up call   Successful outreach call to patient, HIPAA verified , patient discussed doing okay except she still has right shoulder discomfort that she has been bothered with for years . She reports that she has an orthopedic appointment on June 5th, Discussed transportation and plan to use [name] [employer] transportation that she has used previously, verified that she she has contact number.   Patient discussed that she has all her medication through [facility] now. Patient states she has received her new blood sugar meter and has reviewed last 2 days 170 and 250.   Patient discussed COPD and her breathing is about the same, using rescue inhaler about once a day, has occasional coughing spell, white colored sputum. Reviewed COPD action plan.   Discussed with patient rescheduling home visit on next week. Patient again mentions wanting help with getting blood pressure monitor, for home use remarks that she is on blood pressure medication and wants to keep a check on blood pressure.   Plan  mail message to [name] secretary regarding mailing blood pressure monitor to patient.  Will plan follow up call in the next month.   Joylene Draft, RN, [place] Management Coordinator  [phone]- Mobile [phone]- Toll Free Main Office

## 2018-02-13 ENCOUNTER — Ambulatory Visit: Payer: Self-pay | Admitting: *Deleted

## 2018-02-14 ENCOUNTER — Other Ambulatory Visit: Payer: Self-pay

## 2018-02-27 ENCOUNTER — Other Ambulatory Visit: Payer: Self-pay | Admitting: *Deleted

## 2018-02-27 ENCOUNTER — Encounter: Payer: Self-pay | Admitting: *Deleted

## 2018-02-27 NOTE — Patient Outreach (Signed)
[facility] [facility]) Care Management  02/27/2018  Kendra [name] 08-04-1946 [id number]   Referral received 4/15 Referral source: PCP office, insurance referral  Referral reason; COPD, Transportation needs, concern for paying more medication.  Insurance: [employer]  Chart reviewed PMHx includes Diabetes, COPD, Osteoarthritis, CVA   Unsuccessful telephone outreach to patient, no answer to mobile number able to leave a HIPAA compliant message. Placed call to home number phone just rang then line disconnected as if phone hung up.  Plan Will await return call, if no response will send unsuccessful outreach letter, will plan outreach call in the next 3 to 4 business days.   Joylene Draft, RN, [place] Management Coordinator  [phone]- Mobile [phone]- Toll Free Main Office

## 2018-02-28 ENCOUNTER — Other Ambulatory Visit: Payer: Self-pay | Admitting: Nurse Practitioner

## 2018-02-28 DIAGNOSIS — E559 Vitamin D deficiency, unspecified: Secondary | ICD-10-CM

## 2018-03-01 ENCOUNTER — Other Ambulatory Visit: Payer: Self-pay | Admitting: *Deleted

## 2018-03-01 NOTE — Patient Outreach (Signed)
[facility]) Care Management  03/01/2018  Kendra [name] 03-08-1946 [id number]  Telephone call attempt #2  Referral received 4/15 Referral source: PCP office, insurance referral  Referral reason; COPD, Transportation needs, concern for paying more medication.  Insurance: [employer]  Chart reviewed PMHx includes Diabetes, COPD, Osteoarthritis, CVA  Unsuccessful outreach call attempt to patient home and mobile numbers, no answer , no answering to leave a voice message.   Plan  Will plan 3rd call attempt in day #7 , 6/20.   Joylene Draft, RN, [place] Management Coordinator  [phone]- Mobile [phone]- Toll Free Main Office

## 2018-03-07 ENCOUNTER — Other Ambulatory Visit: Payer: Self-pay | Admitting: *Deleted

## 2018-03-07 ENCOUNTER — Other Ambulatory Visit: Payer: Self-pay | Admitting: Family Medicine

## 2018-03-07 DIAGNOSIS — J449 Chronic obstructive pulmonary disease, unspecified: Secondary | ICD-10-CM

## 2018-03-07 NOTE — Telephone Encounter (Signed)
Pt needs a refill on symbicort and medium diapers sent to [facility] order.  Her call back number is [phone]

## 2018-03-07 NOTE — Patient Outreach (Signed)
[name]) Care Management  03/07/2018  Kendra [name] [birth year] [id number]   Telephone outreach call  Successful outreach call to patient, HIPAA verified. Patient discussed having spasm discomfort in legs has medication that she takes at night that helps. Patient discussed recent visit from home health therapist from [name] that came to evaluate her and she discussed plans for outpatient therapy.  Patient reports she is has all her of medication,except she is out of Symbicort states [employer] needs a new refill on medication from doctor office and she plans to call them on today. Patient reports having her albuterol inhaler and uses it about twice daily for wheezing. Patient reports usual cough and clear sputum.  Patient discussed using [employer] OTC to order pull up briefs, she believes that she still has some money left on her this quarter benefit, verified with patient that she has contact number to use.   Patient unable to complete call at this time, needs to go she is agreeable to call in the next week and discuss home visit .    Plan  Will plan return call within a week,  regarding getting medication refill and accessing [employer] for over the counter use.   Joylene Draft, RN, [place] Management Coordinator  [phone]- Mobile [phone]- Toll Free Main Office

## 2018-03-08 MED ORDER — BUDESONIDE-FORMOTEROL FUMARATE 160-4.5 MCG/ACT IN AERO: INHALATION_SPRAY | RESPIRATORY_TRACT | 3 refills | Status: DC

## 2018-03-09 ENCOUNTER — Other Ambulatory Visit: Payer: Self-pay | Admitting: Nurse Practitioner

## 2018-03-09 DIAGNOSIS — F331 Major depressive disorder, recurrent, moderate: Secondary | ICD-10-CM

## 2018-03-09 DIAGNOSIS — M25511 Pain in right shoulder: Secondary | ICD-10-CM

## 2018-03-09 DIAGNOSIS — E785 Hyperlipidemia, unspecified: Secondary | ICD-10-CM

## 2018-03-12 ENCOUNTER — Other Ambulatory Visit: Payer: Self-pay | Admitting: *Deleted

## 2018-03-12 NOTE — Patient Outreach (Signed)
[facility]) Care Management  03/12/2018  Kendra [name] Apr 01, 1946 [id number]   Referral received 4/15 Referral source: PCP office, insurance referral  Referral reason; COPD, Transportation needs, concern for paying more medication.  Insurance: [employer]  Chart reviewed PMHx includes Diabetes, COPD, Osteoarthritis, CVA   Unsuccessful telephone outreach to patient , able to leave a voice message on phone for return call.   Plan Will return call in the next 3 to 4 business days, unsuccessful outreach letter on 6/12, Unsuccessful outreaches on 6/12,6/14 and 6/20 brief telephone outreach patient had to hang up. 3 unsuccessful contacts in the last 4 calls.    Joylene Draft, RN, [place] Management Coordinator  [phone]- Mobile [phone]- Toll Free Main Office

## 2018-03-15 ENCOUNTER — Other Ambulatory Visit: Payer: Self-pay | Admitting: *Deleted

## 2018-03-15 NOTE — Patient Outreach (Signed)
[facility]) Care Management  03/15/2018  Kendra [name] Jun 27, 1946 [id number]   Referral received 4/15 Referral source: PCP office, insurance referral  Referral reason; COPD, Transportation needs, concern for paying more medication.  Insurance: [employer] Chart reviewed PMHx includes Diabetes, COPD, Osteoarthritis, CVA  Subjective Successful outreach call to patient , HIPAA verified.  Patient reports feeling pretty good on today.  COPD  Patient denies increase in shortness of breath, reports having a cough , clear thick sputum, denies fever, or wheezing noted.  Patient reports she has received her symbicort and using daily as prescribed, reports she has also reordered albuterol inhaler as if is near out and using as needed at least 4 days a week for wheezing/sob. Patient reports drinking adequate fluids and staying cool, avoiding going out in the heat.   Diabetes  Patient reports checking blood sugar daily, she reviewed recent reading of [id number]. Patient reports taking po medication daily as prescribed, denies any new symptoms. Patient denies having any low blood sugar readings.   Assessment  COPD- no increase in symptoms, will benefit from continued education support.   Patient declined home visit , agreeable to telephone visit in the next month.   Plan  Will plan follow up call in the next month,  To assess for further community care management concern .     Joylene Draft, RN, [place] Management Coordinator  [phone]- Mobile [phone]- Toll Free Main Office

## 2018-03-20 ENCOUNTER — Other Ambulatory Visit: Payer: Self-pay | Admitting: Nurse Practitioner

## 2018-03-20 ENCOUNTER — Other Ambulatory Visit: Payer: Self-pay | Admitting: Pharmacist

## 2018-03-20 ENCOUNTER — Telehealth: Payer: Self-pay | Admitting: Nurse Practitioner

## 2018-03-20 DIAGNOSIS — E559 Vitamin D deficiency, unspecified: Secondary | ICD-10-CM

## 2018-03-20 DIAGNOSIS — J449 Chronic obstructive pulmonary disease, unspecified: Secondary | ICD-10-CM

## 2018-03-20 NOTE — Patient Outreach (Addendum)
[place] [facility]) Care Management  03/20/2018  SHEILLA MARIS [birth year] [id number]  Outreach call to [name] regarding her request for follow up from the [facility] Medication Adherence Campaign. Speak with patient. HIPAA identifiers verified and verbal consent received.  Ms. Roets reports that she is feeling short of breath today. Reports that she has been using her Symbicort inhaler twice daily as directed, rinsing her mouth out after each use, but that she is concerned as she is running low on her albuterol inhaler, only has 10 puffs remaining on this inhaler. Reports that she has been using this inhaler about twice daily this week.  Counsel patient about the importance of planning ahead with mail order to insure that she has a sufficient supply of her inhalers and other medications before she runs out. As patient will need a new albuterol inhaler before this can be delivered by [place], let patient know that I will call her PCP office today to request that a refill be called into a local pharmacy, patient requests to use [address] in [place].  Ask patient if she has called to discuss her shortness of breath with her PCP's office. Patient reports that she has not, reporting that she only thought that she should call if it was an emergency. Counsel patient about calling her PCP sooner for shortness of breath or other warning symptoms/signs of a flare. Review these symptoms with the patient. Patient verbalizes understanding and states that she will call the office to let them know about her symptoms and schedule an appointment. Note that patient is also working with [place] for education about her COPD.  Ms. Bischoff reports that she has been taking her atorvastatin and lisinopril each once daily as directed. However, patient reports that she sometimes misses doses. Discuss with patient the importance of medication adherence  and medication adherence techniques. Patient reports that she does have a weekly pillbox, but does not currently use it. Patient also reports difficulty with keeping up with her medication refills through [facility]. Counsel patient about the value of using the pillbox as a tool to help with her medication adherence and how filling this tool ahead of time can help her to plan ahead for ordering her medications from [employer]. Note that patient has previously worked with [name] RPh Ruben Reason for medication management. Patient expresses interest in having a pharmacist come to her home again to help her with learning to use the medication pillbox to improve her medication adherence.  Ms. Hietala denies any further medication questions/concerns at this time. Provide patient with my phone number as well as the phone number for the 24- hour nurse advice line.  PLAN  1) Will call patient's PCP office to request a prescription for an albuterol rescue inhaler be called into patient's local pharmacy [facility]) today.  2) Ms. Moger to call to follow up with her PCP office regarding her current symptoms.  3) Will route my note to [place] who is currently working with Ms. Althouse  4) Will send an Google to [place] to ask her to schedule a visit with the patient to aid her with setting up her pillbox/ medication adherence.  5) Let Ms. Yorks know that I will call to follow up with her again on 03/22/18 to insure that she was able to pick up her albuterol rescue inhaler.  [name], PharmD, [place]  [place] Management [phone]

## 2018-03-20 NOTE — Patient Outreach (Signed)
[place] [facility]) Care Management  03/20/2018  Kendra [name] Jan 25, 1946 [id number]   Call patient's PCP office to request a prescription for an albuterol rescue inhaler be called into patient's local pharmacy [facility]) today. Speak with Thayer Headings in the office.  [name], PharmD, [place] Management [phone]

## 2018-03-20 NOTE — Telephone Encounter (Signed)
Elizabeth with [facility] talked with pt today and pt told her she was having shortness of breath.  Elizabeth asked if pt's albuterol could be sent to [facility] in [place] and not have to wait on mail order.  Her call back number is [phone]

## 2018-03-22 ENCOUNTER — Other Ambulatory Visit: Payer: Self-pay | Admitting: Pharmacist

## 2018-03-22 ENCOUNTER — Ambulatory Visit: Payer: Self-pay | Admitting: Pharmacist

## 2018-03-22 ENCOUNTER — Telehealth: Payer: Self-pay | Admitting: Nurse Practitioner

## 2018-03-22 ENCOUNTER — Other Ambulatory Visit: Payer: Self-pay

## 2018-03-22 MED ORDER — ALBUTEROL SULFATE HFA 108 (90 BASE) MCG/ACT IN AERS: 1.0000 | INHALATION_SPRAY | Freq: Four times a day (QID) | RESPIRATORY_TRACT | 3 refills | Status: DC | PRN

## 2018-03-22 NOTE — Telephone Encounter (Signed)
Responded in other encounter.

## 2018-03-22 NOTE — Telephone Encounter (Signed)
Need albuterol inhaler to local pharmacy not mail order as she has had some SOB episodes related to heat,

## 2018-03-22 NOTE — Telephone Encounter (Signed)
[facility]  Called requesting that we call pt  [facility]ol be [facility] into   [facility]

## 2018-03-22 NOTE — Patient Outreach (Signed)
[facility]) Care Management  03/22/2018  Kendra [name] June 28, 1946 [id number]   Receive a message within Epic from patient's PCP to let me know that a prescription for patient's albuterol inhaler has been called into patient's [place]. Call patient's [place] to confirm that the prescription has been received and filled. Speak with pharmacist Marya Amsler regarding a billing issue with the prescription and assist him with resolving this issue. Reports that the patient's copayment will be $0 and that the pharmacy will get the prescription ready.  [name], PharmD, [place] Management [phone]

## 2018-03-22 NOTE — Patient Outreach (Signed)
[facility]) Care Management  03/22/2018  TRENITA HULME 1946/05/27 [id number]   72 y.o. year old female referred to [place] for Medication Adherence (EMMI referral med adherence) Referred by [name], [facility] pharmacist, from her med adherence EMMI campaign (see note 03/20/18 for detail). I have previously worked with this patient in April/May of 2019.   Patient with [facility] Advantage plan.   Patient confirms identity with HIPAA-identifiers x2 and gives verbal consent to speak over the phone about medications.    Medication Adherence: Patient would like assistance with using her pill box and knowing when and how to reorder her prescriptions from [place].   Plan:  1. Will attend primary care office visit with patient on Monday to provide counseling support and also review her pill box and medications after the visit. [place] to alert them that I will be attending patient's visit.  2. Patient states she is not sure if she has transportation to the visit. Called [place], [facility] BSW to assist in setting up transportation.   Follow up on Monday, July 8.   Ruben Reason, PharmD Clinical Pharmacist, [place] Network [phone]

## 2018-03-22 NOTE — Patient Outreach (Signed)
[name]) Care Management  03/22/2018  Kendra [name] 1946-03-27 [id number]   Called and spoke with patient. HIPAA identifiers verified and verbal consent received. Let Ms. Oats know that I just called her local [place] and that the prescription for her albuterol inhaler is now ready for pick up. Ms. Wickes confirms that she spoke with [facility] Pharmacist Ruben Reason and will meet with Almyra Free at her PCP's office on Monday, following her appointment with her PCP.  Ms. Subia denies any further medication questions or concerns at this time.   [name], PharmD, [place] Management [phone]

## 2018-03-22 NOTE — Patient Outreach (Signed)
[facility] [facility]) Care Management  03/22/2018  Kendra [name] 07-28-1946 [id number]  BSW received a request from Ruben Reason, [facility] pharmacist, to assist the patient with transportation to her physician appointment on Monday July 8th. BSW arranged transportation through [address]. BSW contacted the patient to confirm transportation had been arranged.   [name], BSW, CDP [facility] [phone]

## 2018-03-25 ENCOUNTER — Ambulatory Visit: Payer: Self-pay | Admitting: Nurse Practitioner

## 2018-03-25 ENCOUNTER — Other Ambulatory Visit: Payer: Self-pay | Admitting: Pharmacist

## 2018-03-25 ENCOUNTER — Ambulatory Visit: Payer: Self-pay | Admitting: Pharmacist

## 2018-03-25 ENCOUNTER — Other Ambulatory Visit: Payer: Self-pay | Admitting: Nurse Practitioner

## 2018-03-25 ENCOUNTER — Telehealth: Payer: Self-pay

## 2018-03-25 ENCOUNTER — Other Ambulatory Visit: Payer: Self-pay

## 2018-03-25 ENCOUNTER — Telehealth: Payer: Self-pay | Admitting: Nurse Practitioner

## 2018-03-25 DIAGNOSIS — E118 Type 2 diabetes mellitus with unspecified complications: Secondary | ICD-10-CM

## 2018-03-25 DIAGNOSIS — M25511 Pain in right shoulder: Secondary | ICD-10-CM

## 2018-03-25 MED ORDER — BACLOFEN 10 MG PO TABS: 10.0000 mg | ORAL_TABLET | Freq: Every evening | ORAL | 0 refills | Status: DC | PRN

## 2018-03-25 NOTE — Patient Outreach (Signed)
[name] Care Management  03/25/2018  Kendra [name] [birth year] [id number]   4:00 PM:   Followed up with patient this afternoon after speaking with [name], [facility] RNCM. [name] states she is feeling better than she felt this morning. Patient has agreed to co-home visit with Maudie Mercury and myself on Thursday.   Ruben Reason, PharmD Clinical Pharmacist, [place] Network [phone]

## 2018-03-25 NOTE — Telephone Encounter (Signed)
Pt. Called requesting refill on  Baclofen [facility]. Pt. Call back  # is  [phone]

## 2018-03-25 NOTE — Telephone Encounter (Signed)
Patient should keep these types of appointments for best care.  Please make sure she reschedules a followup appointment.

## 2018-03-25 NOTE — Patient Outreach (Signed)
[place] [facility]) Care Management  03/25/2018  Kendra [name] [birth year] [id number]    72 y.o. year old female referred to [place] for [place], [facility] BSW, contacted me this morning stating that [employer] [address] [employer] has been waiting at patient's home to take her to office visit and is unable to contact patient.   Placed call to patient prior to meeting her at her primary care appointment. Patient answered phone and stated she "forgot" she had an appointment today and did not have transportation. I reminded patient that transportation has been arranged for her and has since had to cancel. I asked if her son could take her to the appointment. [name] stated no and she "didn't feel good and didn't want to go" to the doctor.   I encouraged [name] to please go to the doctor if she does not feel good. [name] did not respond. I then asked her to cancel her appointment and provided her the phone number. Encouraged patient to reschedule for later this week.  Placed call to [name]. [name], RN, states that patient did cancel but unfortunately with less than 24 hours notice and the reasoning for cancellation, it will count as a no show. [name] further states that she encouraged patient to come in or reschedule and patient declined.   I will follow up in 2-3 business days to try and schedule the patient with her PCP at a time when her son can take her.   Ruben Reason, PharmD Clinical Pharmacist, [place] Network [phone]

## 2018-03-25 NOTE — Telephone Encounter (Signed)
The pt called today about 10 minutes before her schedule appointment to cancel. She stated the reason for cancelling was because she was up all night coughing and didn't get any sleep. I encourage the patient to keep the appointment since the coughing is worsening. She declined.

## 2018-03-25 NOTE — Patient Outreach (Signed)
[place]) Care Management  03/25/2018  NURA CAHOON Nov 15, 1945 [id number]  BSW received an incomming call from Mr. White the driver assigned to pick-up the patient for this mornings physician appointment. Mr. Dema Severin stated he has been at the patients home for 20 minutes and has attempted to contact her without success. Mr. Dema Severin stated his supervisor, Nathaneil Canary, had just contacted him to say the reservation was canceled. BSW contacted Nathaneil Canary who confirmed she successfully contacted the patient. Mrs. Glennon Mac informed this BSW that the patient canceled the reservation stating to Mrs Glennon Mac "I forgot about the trip and I'm not going".  BSW notified Ruben Reason, pharmacist with [facility] Care Management of patients cancelling arranged transportation.  [name], BSW, CDP [facility] [phone]

## 2018-03-28 ENCOUNTER — Other Ambulatory Visit: Payer: Self-pay | Admitting: *Deleted

## 2018-03-28 ENCOUNTER — Other Ambulatory Visit: Payer: Self-pay | Admitting: Pharmacist

## 2018-03-28 NOTE — Patient Outreach (Addendum)
[facility] [name]) Care Management   03/28/2018  Kendra [name] September 20, 1945 [id number]  Kendra [name] is an 72 y.o. female  Subjective:  Patient report being a little groggy every morning, states never able to sleep off gabapentin.   Patient discussed her breathing is doing pretty good on today.   Patient complains of problems with right shoulder and limited movement , states difficulty with lifting right arm up to use a comb at times. She states that she had referral appointment to orthopedic doctor but she owed  a balance at office and they would not see her until she paid it and she did not have funds. .    Objective:  BP 130/80 (BP Location: Left Arm, Patient Position: Sitting, Cuff Size: Normal)   Pulse 67   Resp 18   SpO2 98%  Review of Systems  Constitutional: Negative.   HENT: Negative.   Eyes: Negative.   Respiratory: Positive for cough and sputum production.   Cardiovascular: Negative.   Gastrointestinal: Negative.   Genitourinary: Negative.   Musculoskeletal: Negative.   Skin: Negative.   Neurological: Negative.   Endo/Heme/Allergies: Negative.   Psychiatric/Behavioral: Negative.     Physical Exam  Constitutional: She is oriented to person, place, and time. She appears well-developed and well-nourished.  Cardiovascular: Normal rate and normal heart sounds.  Respiratory: Effort normal and breath sounds normal.  GI: Soft. Bowel sounds are normal.  Neurological: She is alert and oriented to person, place, and time.  Skin: Skin is warm and dry.  Psychiatric: She has a normal mood and affect. Her behavior is normal. Judgment and thought content normal.    Encounter Medications:   Outpatient Encounter Medications as of 03/28/2018  Medication Sig  . ACCU-CHEK SOFTCLIX LANCETS lancets Use One time daily as instructed  . acetaminophen (TYLENOL) 500 MG tablet Take 1 tablet (500 mg total) by mouth every 8 (eight) hours as needed. (Patient not taking:  Reported on 06/08/2017)  . albuterol (VENTOLIN HFA) 108 (90 Base) MCG/ACT inhaler Inhale 1-2 puffs into the lungs every 6 (six) hours as needed for wheezing or shortness of breath.  [name] aspirin 81 MG EC tablet Take 1 tablet (81 mg total) by mouth daily. Swallow whole.  [name] atorvastatin (LIPITOR) 40 MG tablet Take 1 tablet (40 mg total) by mouth daily. In place of Pravastatin  . baclofen (LIORESAL) 10 MG tablet Take 1 tablet (10 mg total) by mouth at bedtime as needed for muscle spasms.  . Blood Glucose Monitoring Suppl (ACCU-CHEK AVIVA PLUS) w/Device KIT CHECK BLOOD SUGAR ONE TIME DAILY  . budesonide-formoterol (SYMBICORT) 160-4.5 MCG/ACT inhaler USE 2 PUFFS BY INHALATION  TWO TIMES DAILY  . citalopram (CELEXA) 20 MG tablet Take 1 tablet (20 mg total) by mouth daily.  [name] gabapentin (NEURONTIN) 300 MG capsule Take one tablet three times during the day.  Take 2 tablets at bedtime.  [name] glipiZIDE (GLUCOTROL) 10 MG tablet Take 1 tablet (10 mg total) by mouth daily before breakfast.  . glucose blood (ACCU-CHEK AVIVA PLUS) test strip CHECK BLOOD SUGAR ONE TIME DAILY  . Insulin Glargine (LANTUS SOLOSTAR) 100 UNIT/ML Solostar Pen INJECT  22 UNITS SUBCUTANEOUSLY EVERY DAY  AT  10PM  . Insulin Pen Needle (BD PEN NEEDLE NANO U/F) 32G X 4 MM MISC USE WITH LANTUS AS DIRECTED  . lidocaine (XYLOCAINE) 5 % ointment Apply 1 application topically 4 (four) times daily as needed. (Patient not taking: Reported on 01/09/2018)  . lisinopril (PRINIVIL,ZESTRIL) 2.5 MG tablet Take  1 tablet (2.5 mg total) by mouth daily.  . meloxicam (MOBIC) 15 MG tablet Take 1 tablet (15 mg total) by mouth daily.  . Vitamin D, Ergocalciferol, (DRISDOL) 50000 units CAPS capsule TAKE 1 CAPSULE ONE TIME WEEKLY   No facility-administered encounter medications on file as of 03/28/2018.     Functional Status:   In your present state of health, do you have any difficulty performing the following activities: 01/07/2018  Hearing? N  Vision? N   Difficulty concentrating or making decisions? N  Walking or climbing stairs? N  Dressing or bathing? N  Doing errands, shopping? Y  Comment needs Agricultural engineer and eating ? Y  Comment son helps  Using the Toilet? N  In the past six months, have you accidently leaked urine? Y  Do you have problems with loss of bowel control? N  Managing your Medications? N  Managing your Finances? N  Housekeeping or managing your Housekeeping? N  Some recent data might be hidden    Fall/Depression Screening:    Fall Risk  01/07/2018 09/29/2016 07/26/2016  Falls in the past year? No Yes Yes  Number falls in past yr: - 2 or more 1  Injury with Fall? - Yes Yes  Comment - - Injured her hips  Follow up - - -   PHQ 2/9 Scores 01/04/2018 06/08/2017 03/30/2017 09/29/2016 07/26/2016 01/18/2016 09/29/2015  PHQ - 2 Score '2 3 4 '$ 0 6 0 0  PHQ- 9 Score '7 15 20 '$ - 17 - -    Assessment:  Routine home visit , co visit with Ruben Reason, Pharmacist. Patient has at least 4 other adults present in her home, we met with patient in her kitchen.   COPD- reports being in green zone after symptoms reviewed. Still smoking not ready to quit yet . Will benefit from continued education and support.   Diabetes - has accu check arriva meter but does not have correct strips to match. [facility] pharmacist assisted with ordering correct strips .  Patient toenails long,  dark discolored , with callus great toe areas .skin intact she has never had podiatry visit for nail trim .  Patient plans to follow up on eye exam, but wants to address shoulder issue first.   Shoulder/discomfort _  Limited movement with right shoulder, patient is agreeable to follow up outpatient therapy at this time .  She will need a  referral if MD agreeable.   Social Issues  Patient  discussed problems with co payments with speciality visits, deductions in food stamps,she does get once a month food delivery from a church.  Patient discussed she has  received a letter and not eligible for medicaid any longer states someone told her to reapply again, but she is unsure.  She  interested any other community resources that may be available. Patient is familiar with and plans to use [employer] transportation for medical appointments and states aware  to call at least 3 business day prior to appointment reminded that she has 10 one way trips left.  Patient concern regarding transportation difficulty to shopping as family at times does not have gas money, and there is a distance to bus stop in the area. Patient asked about housing resources in areas more accessible to public transportation .   Plan:  Will continue education and support on COPD Will plan return follow up call in the next 2 weeks to follow up on Accu meter strips and monitoring reading. Will notify PCP of patient  wanting physical therapy treatment and will need new referral . Provided written instructions on transportation contact and OTC contact number and placed on patient refrigerator . Will re consult [facility] Social worker consult due to patient ongoing social issues, medicaid, housing , transportation, meals on wheels.  Will send this visit note to PCP.   [facility] CM Care Plan Problem One     Most Recent Value  Care Plan Problem One  Knowledge related to COPD self care management   Role Documenting the Problem One  Care Management Coordinator  Care Plan for Problem One  Active  [facility]N Long Term Goal   Patient will report increased knowledge of COPD self care managment in the next 90 days  [goal updated ]  THN Long Term Goal Start Date  01/11/18  Interventions for Problem One Long Term Goal  home visit completed , reinforced the differences in her rescue inhaler and symbicort daily inhaler, discussed how cigarette smoking is a irritant to lung and will worsen condition .   THN CM Short Term Goal #1   Patient will report attending PCP visit in the next 14 days   THN CM Short Term Goal #1 Start  Date  01/11/18  [facility] CM Short Term Goal #1 Met Date  01/24/18  THN CM Short Term Goal #2   Over the next 30 days patient will be able to report 3 symptoms of worsening COPD and action plan [goal restablished ]  THN CM Short Term Goal #2 Start Date  03/28/18  Interventions for Short Term Goal #2  RN explained COPD zones with patient , provided zone magnet placed on refrigerator   THN CM Short Term Goal #3  Patient will be able to reports how to successfully make contact for OTC products. in the next 20 days   [goal date restated ]  THN CM Short Term Goal #3 Start Date  03/28/18  Interventions for Short Tern Goal #3  RN provided written note for [employer] OTC contact information, pharmacy to provide catalog.       Joylene Draft, RN, [place] Management Coordinator  [phone]- Mobile [phone]- Toll Free Main Office

## 2018-03-28 NOTE — Patient Outreach (Signed)
[place] [facility]) Care Management  [place]   03/28/2018  Kendra [name] 15-Jan-1946 [id number]  72 y.o. year old female referred to [place] for Medication Management and Medication Adherence Referred by [name], [facility] Pharmacist, following an EMMI call. I have previously worked with this patient in April/May 2019.   Patient with [facility] Advantage plan.   Visit conducted in patient's home with [name], [facility]. Patient identified self x 2.    Subjective:   Medication Adherence: Referral based on EMMI medication adherence campaign. Patient has a pill box that she doesn't use.   Medication Assistance:  Patient has Medicare full Extra Help and uses [employer] Mail order so that her copays on tier 1 and 2 generics are $0. Patient still reports difficulty paying for copays for physician appointments. On going difficulty with Medicaid, food stamps, and transportation.   Medication Management:  #HLD: per labs 01/17/18, lipids are controlled  #Diabetes: per A1c 01/17/18 of 7.8%, below goal for age of <8.0%; patient has incorrect strips that do not match her meter #Vitamin D deficiency: last labs drawn 06/08/17 #neuropathy: patient complaining of excessive somnolence with gabapentin #COPD: using Symbicort and albuterol as needed #osteoarthritis: difficulty gripping items, especially with right hand #smoking: not ready to quit    Objective:   Encounter Medications: Outpatient Encounter Medications as of 03/28/2018  Medication Sig Note  . albuterol (VENTOLIN HFA) 108 (90 Base) MCG/ACT inhaler Inhale 1-2 puffs into the lungs every 6 (six) hours as needed for wheezing or shortness of breath.   [name] aspirin 81 MG EC tablet Take 1 tablet (81 mg total) by mouth daily. Swallow whole.   [name] atorvastatin (LIPITOR) 40 MG tablet Take 1 tablet (40 mg total) by mouth daily. In place of Pravastatin   . budesonide-formoterol (SYMBICORT) 160-4.5 MCG/ACT inhaler USE 2  PUFFS BY INHALATION  TWO TIMES DAILY   . citalopram (CELEXA) 20 MG tablet Take 1 tablet (20 mg total) by mouth daily.   [name] gabapentin (NEURONTIN) 300 MG capsule Take one tablet three times during the day.  Take 2 tablets at bedtime. 03/28/2018: Patient experiencing excessive drowsiness  . glipiZIDE (GLUCOTROL) 10 MG tablet Take 1 tablet (10 mg total) by mouth daily before breakfast.   . Insulin Glargine (LANTUS SOLOSTAR) 100 UNIT/ML Solostar Pen INJECT  22 UNITS SUBCUTANEOUSLY EVERY DAY  AT  10PM   . lidocaine (XYLOCAINE) 5 % ointment Apply 1 application topically 4 (four) times daily as needed.   [name] lisinopril (PRINIVIL,ZESTRIL) 2.5 MG tablet Take 1 tablet (2.5 mg total) by mouth daily.   . meloxicam (MOBIC) 15 MG tablet Take 1 tablet (15 mg total) by mouth daily.   . Vitamin D, Ergocalciferol, (DRISDOL) 50000 units CAPS capsule TAKE 1 CAPSULE ONE TIME WEEKLY   . ACCU-CHEK SOFTCLIX LANCETS lancets Use One time daily as instructed   . acetaminophen (TYLENOL) 500 MG tablet Take 1 tablet (500 mg total) by mouth every 8 (eight) hours as needed. (Patient not taking: Reported on 06/08/2017)   . baclofen (LIORESAL) 10 MG tablet Take 1 tablet (10 mg total) by mouth at bedtime as needed for muscle spasms. 03/28/2018: Needs refill  . Blood Glucose Monitoring Suppl (ACCU-CHEK AVIVA PLUS) w/Device KIT CHECK BLOOD SUGAR ONE TIME DAILY   . glucose blood (ACCU-CHEK AVIVA PLUS) test strip CHECK BLOOD SUGAR ONE TIME DAILY (Patient not taking: Reported on 03/28/2018) 03/28/2018: Patient has "True Meter" strips that do not match her AcuChek Aviva meter. Reordered the  Aviva Plus test strips, next refill date: 04/13/18  . Insulin Pen Needle (BD PEN NEEDLE NANO U/F) 32G X 4 MM MISC USE WITH LANTUS AS DIRECTED    No facility-administered encounter medications on file as of 03/28/2018.     Functional Status: In your present state of health, do you have any difficulty performing the following activities: 01/07/2018  Hearing? N   Vision? N  Difficulty concentrating or making decisions? N  Walking or climbing stairs? N  Dressing or bathing? N  Doing errands, shopping? Y  Comment needs Agricultural engineer and eating ? Y  Comment son helps  Using the Toilet? N  In the past six months, have you accidently leaked urine? Y  Do you have problems with loss of bowel control? N  Managing your Medications? N  Managing your Finances? N  Housekeeping or managing your Housekeeping? N  Some recent data might be hidden    Fall/Depression Screening: Fall Risk  01/07/2018 09/29/2016 07/26/2016  Falls in the past year? No Yes Yes  Number falls in past yr: - 2 or more 1  Injury with Fall? - Yes Yes  Comment - - Injured her hips  Follow up - - -   PHQ 2/9 Scores 01/04/2018 06/08/2017 03/30/2017 09/29/2016 07/26/2016 01/18/2016 09/29/2015  PHQ - 2 Score _0 0 6 0 0  PHQ- 9 Score _1 - 17 - -    Assessment and Plan:  1. Placed call to [facility] mail order to double check status of Baclofen and test strips. Baclofen has been refilled (sent in by Dr. Merrilyn Puma 03/25/18) and patient should receive it sometime next week (7-10 days from order date). Acu Check Aviva strips were filled 01/23/18. Next fill date is April 13 2018. Patient does not know where the Aviva test strips are in her home. [employer] representative Gilmore Laroche) further stated that patient had an approximately $26 bill on her account from ergocalciferol and aspirin.  2. Request OTC catalog for patient to use her [employer] benefit with. Recommended patient use catalog to purchase aspirin and Vitamin D. 3. Counseled patient on adherence and indication for each of her medications.  4. Counseled patient on inhaler technique. Observed her administer Symbicort. Explained that she should use Symbicort once in the morning and once in the evening and it will really help her breathing and coughing. Patient verbalized understanding. Labeled patient's inhalers to help her remember. 5. Educated  patient on how to place refills with [facility] mail order pharmacy. Wrote out simple instructions and left them on her fridge.  6. Will update Dr. Merrilyn Puma on today's visit, particularly patient's Symbicort adherence and somnolence with gabapentin.  7. Discussed ongoing social issues with [name] and patient (Mediciad, housing, transportation, meals, food stamps). [name] to place [facility] Social Work consult.  8. Follow up in 2 weeks to ensure patient has received prescriptions and assess how daily Symbicort has improved her symptoms.   Ruben Reason, PharmD Clinical Pharmacist, [place] [facility] [phone]

## 2018-03-29 ENCOUNTER — Telehealth: Payer: Self-pay | Admitting: Nurse Practitioner

## 2018-03-29 DIAGNOSIS — M25511 Pain in right shoulder: Secondary | ICD-10-CM

## 2018-03-29 NOTE — Telephone Encounter (Signed)
-----   Message from Alfonzo Feller, RN sent at 03/28/2018  9:43 PM EDT ----- Regarding: Patient home visit  Hi [name], PCP , I am [facility] care coordinator following patient .  I had home visit with patient along with [facility] pharmacist on today. One of her concerns was her right shoulder discomfort and limits in movement due to reported rotator cuff. She now wants to pursue outpatient PT sessions. She will be able to use [name] transportation. We have consulted social worker again to look into her other social needs, finances, meals on wheels, possibly pursuing medicaid again , housing resources as mentioned by patient .  Patient reports she was unable to go to ortho referral due to having a balance at office.  Thank You.   Joylene Draft, RN, [place] Management Coordinator  [phone]- Mobile [phone]- Toll Free Main Office

## 2018-03-29 NOTE — Telephone Encounter (Signed)
RIGHT shoulder pain with reduced ROM, rotator cuff limitations.  Patient previously referred to ortho, but has been unable to keep/make this appointment due to outstanding balance.  She had also previously been offered PT and declined.  Now willing.  OV 06/08/2017 with R subacromial corticosteroid injection.    Last OV: 01/17/2018

## 2018-04-01 ENCOUNTER — Other Ambulatory Visit: Payer: Self-pay

## 2018-04-01 DIAGNOSIS — E118 Type 2 diabetes mellitus with unspecified complications: Secondary | ICD-10-CM

## 2018-04-01 MED ORDER — GLUCOSE BLOOD VI STRP: ORAL_STRIP | 4 refills | Status: DC

## 2018-04-02 ENCOUNTER — Other Ambulatory Visit: Payer: Self-pay | Admitting: *Deleted

## 2018-04-02 NOTE — Patient Outreach (Addendum)
[place] [facility]) Care Management  04/02/2018  BAO COREAS 13-Dec-1945 [id number]   Patient referred to this social worker by [facility] Pharmacist and RNCM to assist with referral for Meals on Wheels, housing resources closer to public transportation, questions regarding food stamps and Medicaid. Per patient, resides in her own apartment with her son. Pateint states that she would like a referral for meals on wheels due to difficulty making her own meals. Patient reports that her son helps with meals when he is at home and leaves a cooked meal for her when he leaves but she is interested in a meal that is consistent. This social worker explained that patient may not qualify for Meals on Wheels due to the fact that she is not homebound and her son is available to assist with meals. This social worker will complete referral to confirm.  Per patient, she receives $15.00 in food stamps and has applied for Medicaid but does not qualify. Medicaid eligibility and income limits. This Education officer, museum discussed considering Senior Living due to patient's recent rent increase and difficultry paying utilities. Per patient, her son helps financially  but is not consistent. Per patient, she is not interested in [place] for seniors. Patient will accept a resource list for affordable living.    Plan: This social worker will mail patient a affordable living housing list. Patient will inform food stamp worker of increase in rent. This Education officer, museum will refer patient to Meals on Wheels.   [place], [place] Management [place], [place] Care Management [phone]

## 2018-04-03 ENCOUNTER — Other Ambulatory Visit: Payer: Self-pay | Admitting: *Deleted

## 2018-04-03 NOTE — Patient Outreach (Signed)
[place] [facility]) Care Management  04/03/2018  Kendra [name] 18-May-1946 [id number]   Referral made to Meals on Wheels on patient's behalf. Referral completed with [name] who will forward to the social worker. Patient will go on the waiting list, once her name comes up the social worker will contact patient to complete an in home assessment.   [name] [facility] Care Management [phone]

## 2018-04-04 ENCOUNTER — Encounter: Payer: Self-pay | Admitting: *Deleted

## 2018-04-08 ENCOUNTER — Ambulatory Visit: Payer: Medicare HMO | Admitting: *Deleted

## 2018-04-10 ENCOUNTER — Other Ambulatory Visit: Payer: Self-pay | Admitting: *Deleted

## 2018-04-10 NOTE — Patient Outreach (Signed)
[place] [facility]) Care Management  [place]  04/10/2018   Kendra [name] 02/18/1946 [id number]  Subjective:  Successful outreach call to patient , HIPAA verified.  Patient reports that she is having a pretty good day, she denies increase in shortness of breath, cough or sputum production. Patient reports that she is using  symbicort inhaler  and not having to use albuterol inhaler as much .  Patient discussed that she is still bothered by right shoulder discomfort. She reports that she has her visit for physical therapy in August, discussed patient follow up with orthopedic MD , states she wants to just get started with the therapy of now.   Patient reports that she currently has all of her medication and taking as prescribed, she discussed that she has not received strips for blood sugar meter yet , but received a message from the pharmacy that some medication would be delivered in the next 7 - 10 days she is unable to recall the specifics .   Encounter Medications:  Outpatient Encounter Medications as of 04/10/2018  Medication Sig Note  . ACCU-CHEK SOFTCLIX LANCETS lancets Use One time daily as instructed   . acetaminophen (TYLENOL) 500 MG tablet Take 1 tablet (500 mg total) by mouth every 8 (eight) hours as needed. (Patient not taking: Reported on 06/08/2017)   . albuterol (VENTOLIN HFA) 108 (90 Base) MCG/ACT inhaler Inhale 1-2 puffs into the lungs every 6 (six) hours as needed for wheezing or shortness of breath.   [name] aspirin 81 MG EC tablet Take 1 tablet (81 mg total) by mouth daily. Swallow whole.   [name] atorvastatin (LIPITOR) 40 MG tablet Take 1 tablet (40 mg total) by mouth daily. In place of Pravastatin   . baclofen (LIORESAL) 10 MG tablet Take 1 tablet (10 mg total) by mouth at bedtime as needed for muscle spasms. (Patient not taking: Reported on 03/28/2018) 03/28/2018: Needs refill  . Blood Glucose Monitoring Suppl (ACCU-CHEK AVIVA PLUS) w/Device KIT CHECK BLOOD  SUGAR ONE TIME DAILY   . budesonide-formoterol (SYMBICORT) 160-4.5 MCG/ACT inhaler USE 2 PUFFS BY INHALATION  TWO TIMES DAILY 03/28/2018: She's using  . citalopram (CELEXA) 20 MG tablet Take 1 tablet (20 mg total) by mouth daily.   [name] gabapentin (NEURONTIN) 300 MG capsule Take one tablet three times during the day.  Take 2 tablets at bedtime. 03/28/2018: Patient experiencing excessive drowsiness  . glipiZIDE (GLUCOTROL) 10 MG tablet Take 1 tablet (10 mg total) by mouth daily before breakfast.   . glucose blood (ACCU-CHEK AVIVA PLUS) test strip CHECK BLOOD SUGAR ONE TIME DAILY   . Insulin Glargine (LANTUS SOLOSTAR) 100 UNIT/ML Solostar Pen INJECT  22 UNITS SUBCUTANEOUSLY EVERY DAY  AT  10PM   . Insulin Pen Needle (BD PEN NEEDLE NANO U/F) 32G X 4 MM MISC USE WITH LANTUS AS DIRECTED   . lidocaine (XYLOCAINE) 5 % ointment Apply 1 application topically 4 (four) times daily as needed.   [name] lisinopril (PRINIVIL,ZESTRIL) 2.5 MG tablet Take 1 tablet (2.5 mg total) by mouth daily.   . meloxicam (MOBIC) 15 MG tablet Take 1 tablet (15 mg total) by mouth daily.   . Vitamin D, Ergocalciferol, (DRISDOL) 50000 units CAPS capsule TAKE 1 CAPSULE ONE TIME WEEKLY    No facility-administered encounter medications on file as of 04/10/2018.     Functional Status:  In your present state of health, do you have any difficulty performing the following activities: 01/07/2018  Hearing? N  Vision? N  Difficulty  concentrating or making decisions? N  Walking or climbing stairs? N  Dressing or bathing? N  Doing errands, shopping? Y  Comment needs Agricultural engineer and eating ? Y  Comment son helps  Using the Toilet? N  In the past six months, have you accidently leaked urine? Y  Do you have problems with loss of bowel control? N  Managing your Medications? N  Managing your Finances? N  Housekeeping or managing your Housekeeping? N  Some recent data might be hidden    Fall/Depression Screening: Fall Risk   01/07/2018 09/29/2016 07/26/2016  Falls in the past year? No Yes Yes  Number falls in past yr: - 2 or more 1  Injury with Fall? - Yes Yes  Comment - - Injured her hips  Follow up - - -   PHQ 2/9 Scores 01/04/2018 06/08/2017 03/30/2017 09/29/2016 07/26/2016 01/18/2016 09/29/2015  PHQ - 2 Score '2 3 4 '$ 0 6 0 0  PHQ- 9 Score '7 15 20 '$ - 17 - -    Assessment:   COPD- will benefit from ongoing education and care coordination support, identifies as being in green zone when zone reviewed.  Right shoulder pain - has upcoming physical therapy evaluation, she is aware of copayment and states she should have it on that day.   Diabetes - anticipate refill of strips for meter after 7/27, will support with education.   Plan:  Will continue to follow patient for community care coordination, education and support for chronic conditions of COPD, Diabetes.  Provided patient for address of [facility], to arrange ride for her appointment on 8/ 7 and with PCP on 8/6. Patient able to state contact number for arranging [employer] rides and to arrange 3 days prior to appointments. Reviewed this will leave her 6 one -way trips for the calendar year .  Will plan follow up call in the next 3 weeks .   THN CM Care Plan Problem One     Most Recent Value  Care Plan Problem One  Knowledge related to COPD self care management   Role Documenting the Problem One  Care Management [place] for Problem One  Active  THN Long Term Goal   Patient will report increased knowledge of COPD self care managment in the next 90 days  [goal extended to to reach outcomes ]  THN Long Term Goal Start Date  04/10/18  Interventions for Problem One Long Term Goal  Advised regarding taking inhalers as prescribed, reviewed difference in control and rescue   THN CM Short Term Goal #1   Patient will report attending upcoming appointments in the next 20 days   THN CM Short Term Goal #1 Start Date  04/10/18  Interventions for  Short Term Goal #1  Discussed the importance in keeping all medical appointments   THN CM Short Term Goal #2   Over the next 30 days patient will be able to report 3 symptoms of worsening COPD and action plan  [facility] CM Short Term Goal #2 Start Date  04/10/18 [name] restarted ]  Interventions for Short Term Goal #2  Reviewed COPD yellow zone symptoms       Joylene Draft, RN, [place] Management Coordinator  [phone]- Mobile [phone]- [place]

## 2018-04-12 ENCOUNTER — Ambulatory Visit: Payer: Self-pay | Admitting: *Deleted

## 2018-04-12 ENCOUNTER — Other Ambulatory Visit: Payer: Self-pay | Admitting: Pharmacist

## 2018-04-12 ENCOUNTER — Ambulatory Visit: Payer: Self-pay | Admitting: Pharmacist

## 2018-04-12 ENCOUNTER — Other Ambulatory Visit: Payer: Self-pay | Admitting: *Deleted

## 2018-04-12 NOTE — Patient Outreach (Signed)
[place] [name]) Care Management  04/12/2018  YARIELIS FUNARO [birth year] [id number]   72 y.o. year old female referred to [place] for Medication Adherence (follow up)  Following up from home visit 03/28/18 regarding:  #was OTC catalog received? #ordering Aviva test strips via mail order #is Symbicort daily use controlling symptoms?  Was unable to reach patient via telephone today and have left HIPAA compliant voicemail asking patient to return my call. (unsuccessful outreach #1). Home phone number in chart no longer connects.  Plan: Will follow-up within 2-4  business days via telephone. Noted Chrystal Land, [facility] LCSW, also had difficulty reaching the patient today.   Ruben Reason, PharmD Clinical Pharmacist, [place] [facility] [phone]

## 2018-04-12 NOTE — Patient Outreach (Signed)
[place] [facility]) Care Management  04/12/2018  Kendra [name] 1946/05/31 [id number]   Phone call to patient to confirm receiving  The resources mailed to her regarding affordable housing. Patient did not answer, voicemail message left requesting a return call.   [name] [facility] Care Management [phone]

## 2018-04-16 ENCOUNTER — Other Ambulatory Visit: Payer: Self-pay | Admitting: Pharmacist

## 2018-04-16 NOTE — Patient Outreach (Signed)
[place] [facility]) Care Management  04/16/2018  AUTUM BENFER 01-Apr-1946 [id number]  72 y.o. year old female referred to [place] for Medication Adherence (follow up)  Following up from home visit 03/28/18 regarding:  #was OTC catalog received? #ordering Aviva test strips via mail order #is Symbicort daily use controlling symptoms?  Successfully contacted patient via mobile phone today. Identified self x 2 with HIPAA identifiers.   Patient states that she is "out" of inhalers. At home visit (03/28/18), she had picked up her albuterol inhaler a few days prior and stated that she had two Symbicort inhalers in her room in addition to the one I saw and labeled for her. Additionally, follow up call with [name] (04/10/18) patient does not mention this and states she is doing well.   I begin to ask patient further clarifying questions, but patient states that she is running out of minutes on her cell phone.   [place]. Spoke with Santiago Glad, Education administrator. Next fill date for Symbicort is 05/17/18. [employer] does not have an active Proventil prescription on file.   [employer]. Spoke with Lonn [place], Education administrator. Requested refill for Proventil prescription on file. Proventil will be ready for pick up tomorrow morning.   Called patient to update her that Proventil will be available tomorrow, but unfortunately (likely due to her cell phone minutes), patient did not answer the phone. Left HIPAA compliant message.   Plan:  Will follow up with patient in 2-4 days (hopefully she will be able to purchase more minutes after the first of the month or when SS checks are issued).   After clarifying with patient, it is sometimes possible to achieve a onetime override on prescriptions if a patient has misplaced them. This is an option for her Symbicort if they are truly lost.   Patient has an appointment with her primary care doctor on 04/23/18.   Ruben Reason, PharmD Clinical Pharmacist, [place] [facility] [phone]

## 2018-04-17 ENCOUNTER — Ambulatory Visit: Payer: Self-pay | Admitting: *Deleted

## 2018-04-17 ENCOUNTER — Other Ambulatory Visit: Payer: Self-pay | Admitting: *Deleted

## 2018-04-17 NOTE — Patient Outreach (Signed)
[place] [facility]) Care Management  04/17/2018  Kendra [name] [birth year] [id number]    Phone call to patient to confirm receiving the resources mailed to her regarding affordable housing. Patient did not answer, voicemail message left requesting a return call.   [name] [facility] Care Management [phone]

## 2018-04-18 ENCOUNTER — Other Ambulatory Visit: Payer: Self-pay | Admitting: *Deleted

## 2018-04-18 ENCOUNTER — Ambulatory Visit: Payer: Self-pay | Admitting: *Deleted

## 2018-04-18 NOTE — Patient Outreach (Signed)
[place] [facility]) Care Management  04/18/2018  Kendra [name] 11-09-1945 [id number]   Phone call to patient to confirm receving affordable housing resources mailed to her. This social worker left a Dance movement psychotherapist requesitng a return all.   Plan: This Education officer, museum will send patient an Economist. Follow up within 2-4 business days.  [name] [facility] Care Management [phone]

## 2018-04-19 ENCOUNTER — Other Ambulatory Visit: Payer: Self-pay | Admitting: Pharmacist

## 2018-04-19 NOTE — Patient Outreach (Signed)
[place] [facility]) Care Management  04/19/2018  Kendra [name] 1945/10/24 [id number]   72 y.o. year old female referred to [place] for Medication Adherence (follow up)  Patient confirms identity with HIPAA-identifiers x2 and gives verbal consent to speak over the phone about medications.   Following up from home visit 03/28/18 regarding:  #was OTC catalog received? #ordering Aviva test strips via mail order #is Symbicort daily use controlling symptoms?  Medication Adherence:  Reviewed patient's inhalers with her. Informed her albuterol is ready for her to pick up at [facility] (see note 04/16/18). Symbicort daily use (counseled patient during home visit 03/28/18) was improving her breathing. However, it seems patient started using Symbicort more frequently than BID as prescribed and has run out (not misplaced) her inhalers. Per call with [facility] Mail Order on 04/16/18, next refill is 05/17/18.   Patient states she did receive the Aviva test strips in the mail.   She has not received the OTC catalog requested 03/28/18.   Plan: Noted with patient that Chrystal Land, [facility] LCSW, has been trying to contact her. Patient has enough minutes on her cell phone now.   Will contact Dr. Merrilyn Puma regarding patient's inhaler use. Next appointment is 04/23/18. I will to attend visit (with permission). Reminded patient about this appointment.    I will call [place] and ask for a one time override for Symbicort. I will send patient a copy of the [facility] OTC catalog that we have from the office.   Follow up Monday, plan to attend office visit Tuesday.  Ruben Reason, PharmD Clinical Pharmacist, [place] Network [phone]

## 2018-04-22 ENCOUNTER — Other Ambulatory Visit: Payer: Self-pay | Admitting: Pharmacist

## 2018-04-23 ENCOUNTER — Other Ambulatory Visit: Payer: Self-pay | Admitting: *Deleted

## 2018-04-23 ENCOUNTER — Ambulatory Visit: Payer: Self-pay | Admitting: *Deleted

## 2018-04-23 ENCOUNTER — Ambulatory Visit: Payer: Self-pay | Admitting: Nurse Practitioner

## 2018-04-23 ENCOUNTER — Ambulatory Visit: Payer: Self-pay

## 2018-04-23 NOTE — Patient Outreach (Signed)
[name]) Care Management  04/23/2018  Kendra [name] Jul 05, 1946 [id number]     Phone call to patient to confirm receving affordable housing resources mailed to her. This social worker left a Dance movement psychotherapist requesitng a return all.  Plan: This Education officer, museum will make final attempt to reach patient within 10 days,   [name] [name] Care Management [phone]

## 2018-04-24 ENCOUNTER — Ambulatory Visit: Payer: Medicare HMO | Attending: Nurse Practitioner

## 2018-04-29 ENCOUNTER — Ambulatory Visit: Payer: Medicare HMO

## 2018-04-30 ENCOUNTER — Other Ambulatory Visit: Payer: Self-pay | Admitting: *Deleted

## 2018-04-30 NOTE — Patient Outreach (Signed)
[facility]) Care Management  04/30/2018  Kendra [name] June 01, 1946 [id number]    Telephone follow up call   Referral received 4/15 Referral source: PCP office, insurance referral  Referral reason; COPD, Transportation needs, concern for paying more medication.  Insurance: [employer]  Chart reviewed PMHx includes Diabetes, COPD, Osteoarthritis, CVA    Successful outreach call to patient, confirmed HIPAA x 2 identifiers. Patient discussed having a pretty good day, states she still has some wheezing at times and productive cough white thick sputum but improved on today.  Patient discussed that she uses her blue inhaler at least twice a day for wheezing, reports that she lost her symbicort inhaler while in the yard one day and does not have a replacement yet.  Diabetes Patient discussed that she did received strips for blood sugar meter and she has checked it at least twice she stated reading of 132 and 129   Missed PCP appointment  Discussed patient not attending recent PCP office visits, she states [place] did not have her ride on the book, patient is agreeable to rescheduling visit.   Missed Outpatient Physical therapy visit for right shoulder   Patient states that her shoulder has been feeling better after using exercise ball to rub over her shoulder and she does not plan to attend physical therapy sessions.   Patient is agreeable to home visit to review education on scheduling PCP visit and scheduling transportation with [facility] again.  Patient is not able to identify zone for worsening of COPD, will benefit from reinforcement of self care management education .   Plan  Will plan follow up home visit in the next week, for education and care coordination of COPD. Will in basket Ruben Reason, [name] Pharmacist regarding patient concern regarding not having symbicort inhaler.  Will send PCP this note.    [facility] CM Care Plan Problem One     Most Recent  Value  Care Plan Problem One  Knowledge related to COPD self care management   Role Documenting the Problem One  Care Management Coordinator  Care Plan for Problem One  Active  [facility] Long Term Goal   Patient will report increased knowledge of COPD self care managment in the next 90 days  [goal extended to to reach outcomes ]  [facility] Long Term Goal Start Date  04/10/18  Interventions for Problem One Long Term Goal  Home visit scheduled, advised regarding importance of taking all medication as prescribed to prevent worsening of symptoms, reviewed the difference in her rescue inhaler and daily control inhaler.   [facility] CM Short Term Goal #1   Patient will report attending upcoming appointments in the next 20 days   [facility] CM Short Term Goal #1 Start Date  04/30/18 [name] not met, restarted ]  Interventions for Short Term Goal #1  Advised the importance of attending all medical appointments, will schedule home visit to review how to contact [employer] transportaton for ride.   [facility] CM Short Term Goal #2   Over the next 30 days patient will be able to report 3 symptoms of worsening COPD and action plan  [facility] CM Short Term Goal #2 Start Date  04/10/18 [name] restarted ]  Interventions for Short Term Goal #2  Discussed COPD zones, unable to state by teachback, reviewed zones and importance of action plan to identify sooner to prevent worsening or symptoms and avoid hospital admission       Joylene Draft, RN, [place] Management Coordinator  [place]

## 2018-05-01 ENCOUNTER — Other Ambulatory Visit: Payer: Self-pay | Admitting: Licensed Clinical Social Worker

## 2018-05-01 ENCOUNTER — Ambulatory Visit: Payer: Medicare HMO

## 2018-05-01 NOTE — Patient Outreach (Signed)
[facility]) Care Management  05/01/2018  Kendra [name] 01-16-1946 [id number]  Community resources mailed on today's date as requested by CSW, [name].  [name], BSW, CDP [facility] [phone]

## 2018-05-01 NOTE — Patient Outreach (Signed)
[facility] [facility]) Care Management  05/01/2018  Kendra [name] 12/07/1945 [id number]  [facility] CSW [name] is currently providing coverage for [name]. [facility] CSW completed third and final outreach but was unable to reach patient. However, patient returned call while [facility] CSW was leaving a voice message. [facility] CSW introduced self, reason for call and of [facility] social work services. HIPPA verifications successfully received. Patient reports concerns related to finances and being unable to afford copayments to specialty visits and is unsure if she can apply again for medicaid. Patient is also asking about housing resource information to areas that have public transportation that she may access for shopping. Patient is interested in meals on wheels. Patient is interested in looking into other cost saving measures to help with being able to afford co payments and she is aware that we are unable to assist with payments. [facility] BSW [name] has consulted on patient in the past. Patient reports that she uses [employer] as her primary source of transportation. Patient interested in Meals on Wheels and patient was reminded that Zena made a referral for patient for Meals on Wheels on patient's behalf on 04/03/18. Patient is currently on the waiting list and once her name comes up the social worker will contact patient to complete an in home assessment. Per patient, she still receives $15.00 in food stamps and has applied for Medicaid but does not qualify. Medicaid eligibility and income limit education provided to patient. [facility] CSW Chrystal Land has already mailed patient a Futures trader for affordable living but patient states she needs this information to be resent to her. [facility] CSW will send request to [place] to mail out financial resources, food assistance resources and affordable housing resources to patient's residence. [place] will follow up with patient upon her return  back to the office to ensure resources were successfully received in the mail and that [employer] education has been provided. Patient agreeable to plan.  [name], BSW, MSW, [email or web address] Phone: [phone] Fax: [phone]

## 2018-05-02 ENCOUNTER — Other Ambulatory Visit: Payer: Self-pay | Admitting: *Deleted

## 2018-05-02 ENCOUNTER — Encounter: Payer: Self-pay | Admitting: *Deleted

## 2018-05-02 NOTE — Patient Outreach (Signed)
Kendra [name]) Care Management   05/02/2018  Kendra [name] 07/06/1946 [id number]  Kendra [name] is an 72 y.o. female  Subjective:  Patient reports feeling okay today, she discussed glipizide that she takes in the morning makes her feel nauseated states she plans to talk to MD about it again at visit.  Patient states that she does try to eat something in the morning also.  Patient reports having meter and strips available to check blood sugar but has not checked it yet.  Patient report she breathing is in usual range on today , still has a cough that bothers mostly at night .  Patient reports right shoulder discomfort has improved, but still has some discomfort in her right hand.    Objective:  BP 118/60 (BP Location: Right Arm, Patient Position: Sitting, Cuff Size: Normal)   Pulse 73   Resp 18   SpO2 98%  Review of Systems  Constitutional: Negative.   HENT: Negative.   Eyes: Negative.   Respiratory: Positive for cough.   Cardiovascular: Negative.   Genitourinary: Negative.   Musculoskeletal: Positive for joint pain.       Right shoulder   Skin: Negative.   Neurological: Negative.   Endo/Heme/Allergies: Negative.   Psychiatric/Behavioral: Negative.     Physical Exam  Constitutional: She is oriented to person, place, and time. She appears well-developed and well-nourished.  Cardiovascular: Normal rate and normal heart sounds.  Respiratory: Effort normal and breath sounds normal.  GI: Soft.  Neurological: She is alert and oriented to person, place, and time.  Skin: Skin is warm and dry.  Toe nails on both feet  thick dark discolored and some with long length   Psychiatric: She has a normal mood and affect. Her behavior is normal. Judgment and thought content normal.    Encounter Medications:   Outpatient Encounter Medications as of 05/02/2018  Medication Sig Note  . ACCU-CHEK SOFTCLIX LANCETS lancets Use One time daily as instructed   . aspirin 81 MG  EC tablet Take 1 tablet (81 mg total) by mouth daily. Swallow whole.   [name] atorvastatin (LIPITOR) 40 MG tablet Take 1 tablet (40 mg total) by mouth daily. In place of Pravastatin   . baclofen (LIORESAL) 10 MG tablet Take 1 tablet (10 mg total) by mouth at bedtime as needed for muscle spasms. 03/28/2018: Needs refill  . Blood Glucose Monitoring Suppl (ACCU-CHEK AVIVA PLUS) w/Device KIT CHECK BLOOD SUGAR ONE TIME DAILY   . citalopram (CELEXA) 20 MG tablet Take 1 tablet (20 mg total) by mouth daily.   [name] gabapentin (NEURONTIN) 300 MG capsule Take one tablet three times during the day.  Take 2 tablets at bedtime. 03/28/2018: Patient experiencing excessive drowsiness  . glucose blood (ACCU-CHEK AVIVA PLUS) test strip CHECK BLOOD SUGAR ONE TIME DAILY   . Insulin Glargine (LANTUS SOLOSTAR) 100 UNIT/ML Solostar Pen INJECT  22 UNITS SUBCUTANEOUSLY EVERY DAY  AT  10PM   . Insulin Pen Needle (BD PEN NEEDLE NANO U/F) 32G X 4 MM MISC USE WITH LANTUS AS DIRECTED   . lisinopril (PRINIVIL,ZESTRIL) 2.5 MG tablet Take 1 tablet (2.5 mg total) by mouth daily.   . meloxicam (MOBIC) 15 MG tablet Take 1 tablet (15 mg total) by mouth daily.   . Vitamin D, Ergocalciferol, (DRISDOL) 50000 units CAPS capsule TAKE 1 CAPSULE ONE TIME WEEKLY   . acetaminophen (TYLENOL) 500 MG tablet Take 1 tablet (500 mg total) by mouth every 8 (eight) hours as needed. (Patient not  taking: Reported on 06/08/2017)   . albuterol (VENTOLIN HFA) 108 (90 Base) MCG/ACT inhaler Inhale 1-2 puffs into the lungs every 6 (six) hours as needed for wheezing or shortness of breath.   . budesonide-formoterol (SYMBICORT) 160-4.5 MCG/ACT inhaler USE 2 PUFFS BY INHALATION  TWO TIMES DAILY 03/28/2018: She's using  . glipiZIDE (GLUCOTROL) 10 MG tablet Take 1 tablet (10 mg total) by mouth daily before breakfast.   . lidocaine (XYLOCAINE) 5 % ointment Apply 1 application topically 4 (four) times daily as needed. (Patient not taking: Reported on 05/02/2018)    No  facility-administered encounter medications on file as of 05/02/2018.     Functional Status:   In your present state of health, do you have any difficulty performing the following activities: 01/07/2018  Hearing? N  Vision? N  Difficulty concentrating or making decisions? N  Walking or climbing stairs? N  Dressing or bathing? N  Doing errands, shopping? Y  Comment needs Agricultural engineer and eating ? Y  Comment son helps  Using the Toilet? N  In the past six months, have you accidently leaked urine? Y  Do you have problems with loss of bowel control? N  Managing your Medications? N  Managing your Finances? N  Housekeeping or managing your Housekeeping? N  Some recent data might be hidden    Fall/Depression Screening:    Fall Risk  01/07/2018 09/29/2016 07/26/2016  Falls in the past year? No Yes Yes  Number falls in past yr: - 2 or more 1  Injury with Fall? - Yes Yes  Comment - - Injured her hips  Follow up - - -   PHQ 2/9 Scores 01/04/2018 06/08/2017 03/30/2017 09/29/2016 07/26/2016 01/18/2016 09/29/2015  PHQ - 2 Score _0 0 6 0 0  PHQ- 9 Score _1 - 17 - -    Assessment:  Routine home visit  COPD- reports being in green zone when COPD zones reviewed.  Patient reports using albuterol inhaler at least twice daily, she does not have Symbicort , and it is not available until 8/30 for refill per Ruben Reason , Pharmacist . Patient continues to smoke,unsure if she is ready to quit , review of benefits of quitting.   Diabetes- patient has meter, strips available but not able to locate lancet device to fit lancets that she has, reports they are in her room she will continue to search. She is able to teach back, how to use meter. Unable to recall recent eye exam.    Right shoulder discomfort- reports improvement and does not wish to go to therapy at this time .   PCP follow up - patient reports she has rescheduled PCP visit and arranged transportation per [facility]    Plan:   Will send PCP visit note Provided COPD handout on living with COPD on review of COPD zones.  Review of arranging transportation, confirmed transportation appointment .  Assisted patient with calling [employer] for personal care product order Provided plastic bin to place patient medication bottles and blood sugar meter supplies on hand.  Will plan reminder call on Monday for her PCP visit on 8/21.  [facility] CM Care Plan Problem One     Most Recent Value  Care Plan Problem One  Knowledge related to COPD self care management   Role Documenting the Problem One  Care Management [place] for Problem One  Active  [facility] Long Term Goal   Patient will report increased knowledge of  COPD self care managment in the next 90 days  [goal extended to to reach outcomes ]  [facility] Long Term Goal Start Date  04/10/18  Interventions for Problem One Long Term Goal  Home visit, reinforced taking medication as prescribed, notifying MD sooner of worsening of symptoms and scheduling af office visit, reviewed again when to use rescue inhaler vs daily control inhaler   [facility] CM Short Term Goal #1   Patient will report attending upcoming appointments in the next 20 days   [facility] CM Short Term Goal #1 Start Date  04/30/18 [name] not met, restarted ]  Interventions for Short Term Goal #1  Reinforced attending rescheduled PCP visit, reviewed process for scheduling future rides and return   [facility] CM Short Term Goal #2   Over the next 30 days patient will be able to report 3 symptoms of worsening COPD and action plan  [facility] CM Short Term Goal #2 Start Date  04/10/18 [name] restarted ]  Interventions for Short Term Goal #2  Provided and reviewed information on COPD and zones in Living with COPD packet,       Joylene Draft, RN, [place] Management Coordinator  [phone]- Mobile [phone]- [place]

## 2018-05-06 ENCOUNTER — Ambulatory Visit: Payer: Medicare HMO

## 2018-05-06 ENCOUNTER — Other Ambulatory Visit: Payer: Self-pay | Admitting: *Deleted

## 2018-05-06 NOTE — Patient Outreach (Signed)
[facility]) Care Management  05/06/2018  Kendra [name] [birth year] [id number]   Care Coordination call  Placed call to patient as reminder of her PCP visit on 8/20 She reports that she plans to attend, is able to state arrival time of transportation.   Patient reports she has not been able to locate lancets for lancet device that she has , will she look again. Reviewed with patient [facility] pharmacy contact number for reorder reports she will call if unable to locate or return call to me it assistance needed.   Plan  Will plan return call to patient in the next 3 weeks.    Joylene Draft, RN, [place] Management Coordinator  [phone]- Mobile [phone]- Toll Free Main Office    [place], [place], [place] Management Coordinator  [phone]- Mobile [phone]- Toll Free Main Office

## 2018-05-07 ENCOUNTER — Telehealth: Payer: Self-pay

## 2018-05-07 ENCOUNTER — Ambulatory Visit
Admission: RE | Admit: 2018-05-07 | Discharge: 2018-05-07 | Disposition: A | Discharge status: Home / Self Care | Payer: Medicare HMO | Source: Ambulatory Visit | Attending: Nurse Practitioner | Admitting: Nurse Practitioner

## 2018-05-07 ENCOUNTER — Encounter: Payer: Self-pay | Admitting: Nurse Practitioner

## 2018-05-07 ENCOUNTER — Ambulatory Visit (INDEPENDENT_AMBULATORY_CARE_PROVIDER_SITE_OTHER): Payer: Medicare HMO | Admitting: Nurse Practitioner

## 2018-05-07 ENCOUNTER — Other Ambulatory Visit: Payer: Self-pay

## 2018-05-07 VITALS — BP 113/58 | HR 72 | Temp 98.3°F | Resp 18 | Ht 64.0 in | Wt 163.4 lb

## 2018-05-07 DIAGNOSIS — E1142 Type 2 diabetes mellitus with diabetic polyneuropathy: Secondary | ICD-10-CM

## 2018-05-07 DIAGNOSIS — M19031 Primary osteoarthritis, right wrist: Secondary | ICD-10-CM | POA: Insufficient documentation

## 2018-05-07 DIAGNOSIS — M545 Low back pain, unspecified: Secondary | ICD-10-CM

## 2018-05-07 DIAGNOSIS — E118 Type 2 diabetes mellitus with unspecified complications: Secondary | ICD-10-CM | POA: Diagnosis not present

## 2018-05-07 DIAGNOSIS — M25531 Pain in right wrist: Principal | ICD-10-CM

## 2018-05-07 DIAGNOSIS — M79644 Pain in right finger(s): Secondary | ICD-10-CM

## 2018-05-07 DIAGNOSIS — G8929 Other chronic pain: Secondary | ICD-10-CM | POA: Diagnosis not present

## 2018-05-07 DIAGNOSIS — M79641 Pain in right hand: Secondary | ICD-10-CM | POA: Diagnosis not present

## 2018-05-07 DIAGNOSIS — J449 Chronic obstructive pulmonary disease, unspecified: Secondary | ICD-10-CM | POA: Diagnosis not present

## 2018-05-07 DIAGNOSIS — R6889 Other general symptoms and signs: Secondary | ICD-10-CM | POA: Diagnosis not present

## 2018-05-07 DIAGNOSIS — E1143 Type 2 diabetes mellitus with diabetic autonomic (poly)neuropathy: Secondary | ICD-10-CM | POA: Diagnosis not present

## 2018-05-07 LAB — POCT GLYCOSYLATED HEMOGLOBIN (HGB A1C): Hemoglobin A1C: 6.7 % — AB (ref 4.0–5.6)

## 2018-05-07 MED ORDER — LANCET DEVICE WITH EJECTOR MISC: 1.0000 | Freq: Every day | 0 refills | Status: DC

## 2018-05-07 MED ORDER — BUDESONIDE-FORMOTEROL FUMARATE 160-4.5 MCG/ACT IN AERO: INHALATION_SPRAY | RESPIRATORY_TRACT | 3 refills | Status: DC

## 2018-05-07 MED ORDER — GABAPENTIN 100 MG PO CAPS: ORAL_CAPSULE | ORAL | 3 refills | Status: DC

## 2018-05-07 MED ORDER — BACLOFEN 10 MG PO TABS: 10.0000 mg | ORAL_TABLET | Freq: Every day | ORAL | 0 refills | Status: DC | PRN

## 2018-05-07 MED ORDER — GLIPIZIDE 5 MG PO TABS: 5.0000 mg | ORAL_TABLET | Freq: Every day | ORAL | 5 refills | Status: DC

## 2018-05-07 NOTE — Telephone Encounter (Signed)
Enter in error

## 2018-05-07 NOTE — Assessment & Plan Note (Signed)
Stable neuropathy.  Pt notes gabapentin dose makes her sleepy/dizzy in early am.  She is also sleepy during day when taking 300 mg tid and 600 mg hs.  She currently has adequate relief of symptoms.  Desires to reduce fatigue side effect.  Plan: 1. REDUCE gabapentin to 100 mg bid while awake and 300 mg at bedtime.   2. Follow up 6 weeks reassess pain/side effects after dose decrease.

## 2018-05-07 NOTE — Progress Notes (Signed)
Subjective:    Patient ID: Kendra [name], female    DOB: 1945/10/01, 71 y.o.   MRN: [id number]  Kendra [name] is a 72 y.o. female presenting on 05/07/2018 for Diabetes (pt reports the glipizide makes her nausea, but she still takes it) and Insomnia   HPI Diabetes Pt presents today for follow up of Type 2 diabetes mellitus. She is not checking CBG at home.  She does not have a functioning lancet device. - Current diabetic medications include: Lantus and glipizide. Patient is having nausea with glipizide dose. - She is not currently symptomatic.  - She denies polydipsia, polyphagia, polyuria, headaches, diaphoresis, shakiness, chills, pain, numbness or tingling in extremities and changes in vision.   - Clinical course has been improving. - She  reports no regular exercise routine. - Her diet is moderate in salt, moderate in fat, and moderate in carbohydrates. - Weight trend: steadily increasing  PREVENTION: Eye exam current (within one year): no Foot exam current (within one year): yes  Lipid/ASCVD risk reduction - on statin: yes Kidney protection - on ace or arb: yes Recent Labs    06/08/17 1010 01/17/18 1011 05/07/18 0835  HGBA1C 6.6 7.8 6.7*     Hand pain - prevents physical therapy for her shoulder, but is no longer having shoulder pain.  She has had right CMC joint pain worsening over last several weeks.  Meloxicam is not helping this pain.  Patient continues meloxicam 15 mg once daily.   Insomnia -  Patient had taken ambien from 2015-2017 as prescribed by Dr. Ancil Boozer.  It was stopped in 2017 likely due to side effect that patient is unable to recall.  Currently patient reports difficulty sleeping through the night.  Patient is awoken by noises around her apartment in the middle of the night up to 3 am.  It takes about 90 minutes to return to sleep.  Patient reports she does not feel rested in am.  Is dizzy first in the morning "because of not sleeping off that medicine,  the gabapentin."  She also reports feeling very drowsy throughout the day after she takes her gabapentin.  Peripheral autonomic neuropathy Patient is taking gabapentin 300 mg tid and 600 mg q hs.  Patient notes she is having daytime sleepiness with current dose of medication.  She is denying all pain in feet today with improved DM control.  Social History   Tobacco Use  . Smoking status: Current Some Day Smoker    Packs/day: 0.25    Types: Cigarettes  . Smokeless tobacco: Never Used  Substance Use Topics  . Alcohol use: No  . Drug use: No    Review of Systems Per HPI unless specifically indicated above     Objective:    BP (!) 113/58 (BP Location: Right Arm, Patient Position: Sitting, Cuff Size: Normal)   Pulse 72   Temp 98.3 F (36.8 C) (Oral)   Resp 18   Ht 5\' 4"  (1.626 m)   Wt 163 lb 6.4 oz (74.1 kg)   SpO2 100%   BMI 28.05 kg/m   Wt Readings from Last 3 Encounters:  05/07/18 163 lb 6.4 oz (74.1 kg)  01/17/18 162 lb 12.8 oz (73.8 kg)  06/08/17 150 lb 3.2 oz (68.1 kg)    Physical Exam  Constitutional: She is oriented to person, place, and time. She appears well-developed and well-nourished. No distress.  HENT:  Head: Normocephalic and atraumatic.  Neck: Normal range of motion. Neck supple. Carotid bruit is  not present.  Cardiovascular: Normal rate, regular rhythm, S1 normal, S2 normal, normal heart sounds and intact distal pulses.  Pulmonary/Chest: Effort normal and breath sounds normal. No respiratory distress.  Musculoskeletal: She exhibits no edema (pedal).       Right wrist: She exhibits decreased range of motion (decreased wrist extension, internal/external rotation), tenderness and deformity (bony tenderness and enlargement noted at first [facility], carpal bones of radial aspect of wrist). She exhibits no swelling, no effusion, no crepitus and no laceration.       Hands: Neurological: She is alert and oriented to person, place, and time.  Skin: Skin is warm and dry.   Psychiatric: She has a normal mood and affect. Her behavior is normal.  Vitals reviewed.    Results for orders placed or performed in visit on 05/07/18  POCT glycosylated hemoglobin (Hb A1C)  Result Value Ref Range   Hemoglobin A1C 6.7 (A) 4.0 - 5.6 %   HbA1c POC (<> result, manual entry)     HbA1c, POC (prediabetic range)     HbA1c, POC (controlled diabetic range)        Assessment & Plan:   Problem List Items Addressed This Visit      Respiratory   COPD, moderate ([place])    Currently uncontrolled without Symbicort.  Previously patient was tolerating inhalers well.  Needs refills.  Continues albuterol prn shortness of breath and wheezing.  Plan: 1. Provide refill of Symbicort.  Take 2 puffs bid. 2. Continue albuterol prn. 3. Followup 3 months.  Consider PFT for evaluation of disease progression.       Relevant Medications   budesonide-formoterol (SYMBICORT) 160-4.5 MCG/ACT inhaler     Endocrine   Diabetic peripheral neuropathy associated with type 2 diabetes mellitus (HCC)    Stable neuropathy.  Pt notes gabapentin dose makes her sleepy/dizzy in early am.  She is also sleepy during day when taking 300 mg tid and 600 mg hs.  She currently has adequate relief of symptoms.  Desires to reduce fatigue side effect.  Plan: 1. REDUCE gabapentin to 100 mg bid while awake and 300 mg at bedtime.   2. Follow up 6 weeks reassess pain/side effects after dose decrease.       Relevant Medications   glipiZIDE (GLUCOTROL) 5 MG tablet   gabapentin (NEURONTIN) 100 MG capsule   baclofen (LIORESAL) 10 MG tablet   Lancet Devices (LANCET DEVICE WITH EJECTOR) MISC   Other Relevant Orders   COMPLETE METABOLIC PANEL WITH GFR   Lipid panel   Diabetes mellitus type 2, controlled, with complications ([place]) - Primary    Controlled T2DM w/ A1c <7.0% and at goal.  Today A1c = 6.7%.  Pt taking lantus 22 units once daily w/ glipizide 10 mg once daily with side effect of nausea after taking.      Plan: 1. Continue lantus 22 units once daily w/o changes. - Reduce glipizide to 5 mg once daily with breakfast.  Reduce dose to reduce nausea. May also take 10-15 minutes before breakfast instead of 30 minutes prior. 2. Reviewed need for physical activity. 3. Encouraged healthy diet w/ low glycemic foods. 4. Follow up 3 months.      Relevant Medications   glipiZIDE (GLUCOTROL) 5 MG tablet   gabapentin (NEURONTIN) 100 MG capsule   Lancet Devices (LANCET DEVICE WITH EJECTOR) MISC   Other Relevant Orders   POCT glycosylated hemoglobin (Hb A1C) (Completed)   COMPLETE METABOLIC PANEL WITH GFR   Lipid panel  Other   Chronic midline low back pain without sciatica    Pain likely chronic and associated with arthritis of spine.  Patient with spasm controlled with baclofen 10 mg once daily.    Plan:  1. Continue acetaminophen 1000 mg tid prn for pain. - Continue meloxicam 15 mg once daily for arthritis pain.  Consider stopping med several days to provide prostaglandin recovery.  Discussed protecting stomach and kidneys with patient. 2. Apply heat and/or ice to affected area. 3. May also apply a muscle rub with lidocaine or lidocaine patch after heat or ice. 4. Take muscle relaxer baclofen 10 mg once daily as needed for muscle spasm.  Refill provided.  Cautioned drowsiness. 5. Follow up 3 months        Relevant Medications   baclofen (LIORESAL) 10 MG tablet    Other Visit Diagnoses    Wrist pain, chronic, right       Relevant Medications   gabapentin (NEURONTIN) 100 MG capsule   baclofen (LIORESAL) 10 MG tablet   Other Relevant Orders   DG Hand Complete Right (Completed)   DG Wrist Complete Right (Completed)   Chronic thumb pain, right       Relevant Medications   gabapentin (NEURONTIN) 100 MG capsule   baclofen (LIORESAL) 10 MG tablet   Other Relevant Orders   DG Hand Complete Right (Completed)   DG Wrist Complete Right (Completed)    Wrist and thumb pain likely chronic.   No prior Xray evaluation.  Patient with decreased ROM, decreased grip strength.  No relief with meloxicam currently taken for other osteoarthritis.  - Xray today.  - Continue meloxicam - Consider orthopedic referral, however, patient has been unable to go to orthopedics in past due to debt with local practices. - Followup prn.  Consider OT/PT for increasing ROM.    Meds ordered this encounter  Medications  . glipiZIDE (GLUCOTROL) 5 MG tablet    Sig: Take 1 tablet (5 mg total) by mouth daily before breakfast.    Dispense:  30 tablet    Refill:  5    Always use easy open prescription bottles.    Order Specific Question:   Supervising Provider    Answer:   Olin Hauser [[id number]]  . gabapentin (NEURONTIN) 100 MG capsule    Sig: Take 1 capsule (100mg ) by mouth in morning and afternoon.  Take 3 capsules (300mg ) by mouth at bedtime.    Dispense:  90 capsule    Refill:  3    Order Specific Question:   Supervising Provider    Answer:   Olin Hauser [[id number]]  . baclofen (LIORESAL) 10 MG tablet    Sig: Take 1 tablet (10 mg total) by mouth daily as needed for muscle spasms.    Dispense:  90 each    Refill:  0    Always use easy open prescription bottles.    Order Specific Question:   Supervising Provider    Answer:   Olin Hauser [[id number]]  . budesonide-formoterol (SYMBICORT) 160-4.5 MCG/ACT inhaler    Sig: USE 2 PUFFS BY INHALATION  TWO TIMES DAILY    Dispense:  30.6 g    Refill:  3    Order Specific Question:   Supervising Provider    Answer:   Olin Hauser [[id number]]  . Lancet Devices (LANCET DEVICE WITH EJECTOR) MISC    Sig: 1 Device by Does not apply route daily. Use to check blood sugar once daily.    Dispense:  1 each    Refill:  0    Please send prescription request for lancet refills once brand of lancet device is chosen based on insurance preference for coverage.    Order Specific Question:   Supervising Provider    Answer:   Olin Hauser [[id number]]    Follow up plan: Return in about 6 weeks (around 06/18/2018) for diabetes, insomnia.  [name], DNP, AGPCNP-BC Adult Gerontology Primary Care Nurse Practitioner [facility] 05/07/2018, 10:43 AM

## 2018-05-07 NOTE — Assessment & Plan Note (Signed)
Pain likely chronic and associated with arthritis of spine.  Patient with spasm controlled with baclofen 10 mg once daily.    Plan:  1. Continue acetaminophen 1000 mg tid prn for pain. - Continue meloxicam 15 mg once daily for arthritis pain.  Consider stopping med several days to provide prostaglandin recovery.  Discussed protecting stomach and kidneys with patient. 2. Apply heat and/or ice to affected area. 3. May also apply a muscle rub with lidocaine or lidocaine patch after heat or ice. 4. Take muscle relaxer baclofen 10 mg once daily as needed for muscle spasm.  Refill provided.  Cautioned drowsiness. 5. Follow up 3 months

## 2018-05-07 NOTE — Assessment & Plan Note (Signed)
Currently uncontrolled without Symbicort.  Previously patient was tolerating inhalers well.  Needs refills.  Continues albuterol prn shortness of breath and wheezing.  Plan: 1. Provide refill of Symbicort.  Take 2 puffs bid. 2. Continue albuterol prn. 3. Followup 3 months.  Consider PFT for evaluation of disease progression.

## 2018-05-07 NOTE — Assessment & Plan Note (Signed)
Controlled T2DM w/ A1c <7.0% and at goal.  Today A1c = 6.7%.  Pt taking lantus 22 units once daily w/ glipizide 10 mg once daily with side effect of nausea after taking.     Plan: 1. Continue lantus 22 units once daily w/o changes. - Reduce glipizide to 5 mg once daily with breakfast.  Reduce dose to reduce nausea. May also take 10-15 minutes before breakfast instead of 30 minutes prior. 2. Reviewed need for physical activity. 3. Encouraged healthy diet w/ low glycemic foods. 4. Follow up 3 months.

## 2018-05-07 NOTE — Patient Instructions (Addendum)
[name],   Thank you for coming in to clinic today.  1. Gabapentin 100 mg tablets - NOTE DOSE CHANGE: - Take one 100 mg tablet in morning and afternoon.  Take three 100 mg tablets at bedtime.  You may take one 300 mg tablet at bedtime instead of your new 100 mg tablets until you run out of your current prescription.  2. Glipizide 5 mg tablet - REDUCE DOSE - Take one 5 mg tablet in the morning close to breakfast.  3. Sleepiness - gabapentin reduction should help.   Insomnia - we will address in about 6 weeks.  4. Vitamin D deficiency -  START Vitamin D3 from your catalog.  Purchase 2,000 IU tablets.  Take 2,000 IU (units) once daily.  5. Hand and wrist xray today for your wrist pain.  Please schedule a follow-up appointment with [name], AGNP. Return in about 6 weeks (around 06/18/2018) for diabetes, insomnia.  If you have any other questions or concerns, please feel free to call the clinic or send a message through [place]. You may also schedule an earlier appointment if necessary.  You will receive a survey after today's visit either digitally by e-mail or paper by [employer]. Your experiences and feedback matter to [place].  Please respond so we know how we are doing as we provide care for you.   [name], DNP, AGNP-BC Adult Gerontology Nurse Practitioner [place]

## 2018-05-08 ENCOUNTER — Telehealth: Payer: Self-pay

## 2018-05-08 DIAGNOSIS — M25531 Pain in right wrist: Secondary | ICD-10-CM

## 2018-05-08 LAB — LIPID PANEL
Cholesterol: 120 mg/dL (ref <200)
HDL: 39 mg/dL — ABNORMAL LOW (ref >50)
LDL Cholesterol (Calc): 57 mg/dL (calc)
Non-HDL Cholesterol (Calc): 81 mg/dL (calc) (ref <130)
Total CHOL/HDL Ratio: 3.1 (calc) (ref <5.0)
Triglycerides: 153 mg/dL — ABNORMAL HIGH (ref <150)

## 2018-05-08 LAB — COMPLETE METABOLIC PANEL WITH GFR
AG Ratio: 1.6 (calc) (ref 1.0–2.5)
ALT: 13 U/L (ref 6–29)
AST: 15 U/L (ref 10–35)
Albumin: 4.3 g/dL (ref 3.6–5.1)
Alkaline phosphatase (APISO): 71 U/L (ref 33–130)
BUN: 18 mg/dL (ref 7–25)
CO2: 26 mmol/L (ref 20–32)
Calcium: 9.4 mg/dL (ref 8.6–10.4)
Chloride: 104 mmol/L (ref 98–110)
Creat: 0.92 mg/dL (ref 0.60–0.93)
GFR, Est African American: 73 mL/min/{1.73_m2} (ref >=60)
GFR, Est Non African American: 63 mL/min/{1.73_m2} (ref >=60)
Globulin: 2.7 g/dL (calc) (ref 1.9–3.7)
Glucose, Bld: 157 mg/dL — ABNORMAL HIGH (ref 65–99)
Potassium: 4.1 mmol/L (ref 3.5–5.3)
Sodium: 139 mmol/L (ref 135–146)
Total Bilirubin: 0.4 mg/dL (ref 0.2–1.2)
Total Protein: 7 g/dL (ref 6.1–8.1)

## 2018-05-08 NOTE — Telephone Encounter (Signed)
The referral can be placed to [facility].

## 2018-05-08 NOTE — Telephone Encounter (Signed)
-----   Message from [name], NP sent at 05/08/2018  1:24 PM EDT ----- Regarding: Referral Can you find out which office she has a balance?  It was either [facility] or [facility].  I'll send the referral to the location where she can be seen.   ----- Message ----- From: Wilson Singer, CMA Sent: 05/08/2018   1:18 PM EDT To: [name], NP  The pt was notified and would like for you to place that referral for orthopedics.

## 2018-05-09 ENCOUNTER — Other Ambulatory Visit: Payer: Self-pay | Admitting: Pharmacist

## 2018-05-09 NOTE — Patient Outreach (Signed)
[facility]) Care Management  05/09/2018  Kendra [name] 11-29-1945 [id number]   72 y.o. year old female referred to [place] for Medication Adherence  Patient with [facility] Advantage plan.   Patient confirms identity with HIPAA-identifiers x2 and gives verbal consent to speak over the phone about medications.   Office visit with primary care provider, Dr. [name] on 05/07/18. Several medication changes were made at this appointment.   Medication Adherence: We reviewed patient's medications list, especially emphasizing recent changes in medications (decreased doses of glipizide and gabapentin). Patient is doing well managing COPD and type 2 DM. She has not started the reduced dose in gabapentin yet as she is waiting on the new prescription from [facility] mail order pharmacy. We reviewed how to refill prescriptions from mail order.   Assessment/Plan:  Patient was able to verbalize well medication changes. All clinical pharmacy needs have been met at this time. I hope patient continues to stabilize her management of her chronic disease states. I have ended the clinical pharmacy episode and removed myself from the care team.   Ruben Reason, PharmD Clinical Pharmacist, [place] [phone]

## 2018-05-10 ENCOUNTER — Other Ambulatory Visit: Payer: Self-pay | Admitting: *Deleted

## 2018-05-10 NOTE — Patient Outreach (Signed)
[facility]) Care Management  05/10/2018  Kendra [name] 05/02/1946 [id number]   Phone call to patient to follow up on resources previously mailed to patient regarding affordable housing. Per patient, she now recalls that she did receive the housing resources mailed to her. Per patient, she is considering moving into affordable housing and has identified an apartment however is hesitant because she cannot take her family with her. Per patient, she does not want to abandon them. This Education officer, museum provided emotional support emphasizing placing focus on herself and her own wellbeing as her children are now responsible adults. Patient verbalized an understanding of this and will continue to consider her options. Patient ws able to also verbalize area food pantry options. The [employer]AF[employer] program's contact information provided in addition [address] [place], [place]. Patient verbalized having no additional community resource needs at this time. This social worker's contact information provided if she has in questions or concerns in the future.  This Education officer, museum will sign off and will update RNCM [name].    [name] [facility] Care Management [phone]

## 2018-05-27 DIAGNOSIS — M1811 Unilateral primary osteoarthritis of first carpometacarpal joint, right hand: Secondary | ICD-10-CM | POA: Diagnosis not present

## 2018-05-27 DIAGNOSIS — M67431 Ganglion, right wrist: Secondary | ICD-10-CM | POA: Diagnosis not present

## 2018-05-27 DIAGNOSIS — R6889 Other general symptoms and signs: Secondary | ICD-10-CM | POA: Diagnosis not present

## 2018-05-30 ENCOUNTER — Other Ambulatory Visit: Payer: Self-pay | Admitting: *Deleted

## 2018-05-30 NOTE — Patient Outreach (Signed)
[facility]) Care Management  05/30/2018  Kendra [name] 04-15-1946 [id number]   Telephone follow up call   Successful outreach call to patient.  Patient reports that she is doing pretty good, has been busy attending medical appointments.  She discussed recent appointment with orthopedics regarding right wrist reports that she received and injection and is wearing a brace to wrist , reports improved in comfort.   COPD She reports her breathing is good and cough about the same, no change in sputum color or shortness of breath . Patient continues to smoke. Patient reports using symbicort twice daily. She has refill for albuterol inhaler for pickup at local pharmacy , will call her sister to pick up for her.   Diabetes Patient states that she does not have strips yet to match meter that she has at home . Discussed with patient calling [facility] pharmacy for strips, states that she has not done that , attempt 3 way call to [facility] for requesting strips for meter, wait time 10 to 20 minutes patient states that is too long because her phone minutes is getting low.  Patient agreeable to home visit for support on notifying pharmacy for refills on strips.  Plan Will plan home visit in the next week, for continued care coordination for chronic condition states .    Joylene Draft, RN, [place] Management Coordinator  [phone]- Mobile [phone]- Toll Free Main Office

## 2018-06-03 ENCOUNTER — Other Ambulatory Visit: Payer: Self-pay | Admitting: *Deleted

## 2018-06-03 NOTE — Patient Outreach (Signed)
[facility]) Care Management   06/03/2018  Kendra [name] Dec 20, 1945 [id number]    Kendra [name] is an 72 y.o. female  Subjective:  Patient discussed checking her blood sugar with strips she found in home on yesterday, reports reading 168, patient discussed this being high due to recent death in family.   Patient discussed discomfort with right wrist with some improvement she receiving a injection.   Patient discussed needing help with ordering strips for meter.    Objective:  Pulse 72   Resp 18   SpO2 94%  Review of Systems  Constitutional: Negative.   HENT: Negative.   Eyes: Negative.   Respiratory: Positive for cough.   Cardiovascular: Negative.   Gastrointestinal: Negative.   Genitourinary: Negative.   Musculoskeletal: Positive for joint pain.       Right wrist and shoulder  Skin: Negative.   Neurological: Negative.   Endo/Heme/Allergies: Negative.   Psychiatric/Behavioral: Negative.     Physical Exam  Constitutional: She is oriented to person, place, and time. She appears well-developed and well-nourished.  Cardiovascular: Normal rate and normal heart sounds.  Respiratory: Effort normal and breath sounds normal.  GI: Soft. Bowel sounds are normal.  Musculoskeletal:       Arms: Neurological: She is alert and oriented to person, place, and time.  Skin: Skin is warm and dry.  Psychiatric: She has a normal mood and affect. Her behavior is normal. Thought content normal.    Encounter Medications:   Outpatient Encounter Medications as of 06/03/2018  Medication Sig  . ACCU-CHEK SOFTCLIX LANCETS lancets Use One time daily as instructed  . albuterol (VENTOLIN HFA) 108 (90 Base) MCG/ACT inhaler Inhale 1-2 puffs into the lungs every 6 (six) hours as needed for wheezing or shortness of breath.  [name] aspirin 81 MG EC tablet Take 1 tablet (81 mg total) by mouth daily. Swallow whole.  [name] atorvastatin (LIPITOR) 40 MG tablet Take 1 tablet (40 mg total) by  mouth daily. In place of Pravastatin  . baclofen (LIORESAL) 10 MG tablet Take 1 tablet (10 mg total) by mouth daily as needed for muscle spasms.  . Blood Glucose Monitoring Suppl (ACCU-CHEK AVIVA PLUS) w/Device KIT CHECK BLOOD SUGAR ONE TIME DAILY  . budesonide-formoterol (SYMBICORT) 160-4.5 MCG/ACT inhaler USE 2 PUFFS BY INHALATION  TWO TIMES DAILY  . citalopram (CELEXA) 20 MG tablet Take 1 tablet (20 mg total) by mouth daily.  [name] gabapentin (NEURONTIN) 100 MG capsule Take 1 capsule ('100mg'$ ) by mouth in morning and afternoon.  Take 3 capsules ('300mg'$ ) by mouth at bedtime.  [name] glipiZIDE (GLUCOTROL) 5 MG tablet Take 1 tablet (5 mg total) by mouth daily before breakfast.  . glucose blood (ACCU-CHEK AVIVA PLUS) test strip CHECK BLOOD SUGAR ONE TIME DAILY  . Insulin Glargine (LANTUS SOLOSTAR) 100 UNIT/ML Solostar Pen INJECT  22 UNITS SUBCUTANEOUSLY EVERY DAY  AT  10PM  . Insulin Pen Needle (BD PEN NEEDLE NANO U/F) 32G X 4 MM MISC USE WITH LANTUS AS DIRECTED  . Lancet Devices (LANCET DEVICE WITH EJECTOR) MISC 1 Device by Does not apply route daily. Use to check blood sugar once daily.  [name] lisinopril (PRINIVIL,ZESTRIL) 2.5 MG tablet Take 1 tablet (2.5 mg total) by mouth daily.  . meloxicam (MOBIC) 15 MG tablet Take 1 tablet (15 mg total) by mouth daily.  . Vitamin D, Ergocalciferol, (DRISDOL) 50000 units CAPS capsule TAKE 1 CAPSULE ONE TIME WEEKLY  . acetaminophen (TYLENOL) 500 MG tablet Take 1 tablet (500 mg total)  by mouth every 8 (eight) hours as needed. (Patient not taking: Reported on 05/07/2018)  . lidocaine (XYLOCAINE) 5 % ointment Apply 1 application topically 4 (four) times daily as needed. (Patient not taking: Reported on 05/02/2018)   No facility-administered encounter medications on file as of 06/03/2018.     Functional Status:   In your present state of health, do you have any difficulty performing the following activities: 01/07/2018  Hearing? N  Vision? N  Difficulty concentrating or making  decisions? N  Walking or climbing stairs? N  Dressing or bathing? N  Doing errands, shopping? Y  Comment needs Agricultural engineer and eating ? Y  Comment son helps  Using the Toilet? N  In the past six months, have you accidently leaked urine? Y  Do you have problems with loss of bowel control? N  Managing your Medications? N  Managing your Finances? N  Housekeeping or managing your Housekeeping? N  Some recent data might be hidden    Fall/Depression Screening:    Fall Risk  01/07/2018 09/29/2016 07/26/2016  Falls in the past year? No Yes Yes  Number falls in past yr: - 2 or more 1  Injury with Fall? - Yes Yes  Comment - - Injured her hips  Follow up - - -   PHQ 2/9 Scores 05/07/2018 01/04/2018 06/08/2017 03/30/2017 09/29/2016 07/26/2016 01/18/2016  PHQ - 2 Score '5 2 3 4 '$ 0 6 0  PHQ- 9 Score '19 7 15 20 '$ - 17 -    Assessment:  Routine home visit  COPD- has all medications, verbalized using symbicort twice daily and albuterol as needed at least once daily.  Patient continues to smoke, discussed smoking cessation reports not ready to work on this at present  Discussed getting eye exam, agreeable with making appointment in next month after next MD appointment . [name]   Diabetes - stirps out of date, has other supplies, lancets and lancet device. Needs assistance with reordering strips. Coached patient with placing call to [facility] with contact number previously posted on her refrigerator , placing order for strips, last ordered in July but patient unable locate in home. Patient to anticipate strips to be refilled on 9/25 and anticipate delivery by 10/1 .  Patient discussed next PCP visit on 10/1, patient able to verbalize when and how to make contact with [facility] transportation to set up ride.      Plan:  Provided EMMI on COPD what to do Provided EMMI on rotating sites with insulin .  Will send PCP visit note Will plan follow up call in the next month.  Will send PCP visit  note , and letter for quarterly update.    THN CM Care Plan Problem One     Most Recent Value  Care Plan Problem One  Knowledge related to COPD self care management   Role Documenting the Problem One  Care Management Coordinator  Care Plan for Problem One  Active  THN Long Term Goal   Patient will report increased knowledge of COPD self care managment in the next 90 days  [goal extended to to reach outcomes ]  THN Long Term Goal Start Date  04/10/18  Interventions for Problem One Long Term Goal  Reinforced differences inhaler, symbicort maintenance and albuterol as needed for increased shortness of breath , wheezing.. encouraged calling or refills sooner to avoid running out. , Teachback on worseing symptoms to notify MD of .   [facility]N CM Short Term Goal #1  Patient will report attending upcoming appointments in the next 20 days   [facility]N CM Short Term Goal #1 Start Date  04/30/18 [name] not met, restarted ]  [facility] CM Short Term Goal #1 Met Date  05/07/18  THN CM Short Term Goal #2   Over the next 30 days patient will be able to report 3 symptoms of worsening COPD and action plan  [name] CM Short Term Goal #2 Start Date  04/10/18 [name] restarted ]  [name] CM Short Term Goal #2 Met Date  05/30/18    [facility] CM Care Plan Problem Two     Most Recent Value  Care Plan Problem Two  Diabetes self care managment knowledge deficit   Role Documenting the Problem Two  Care Management Coordinator  Care Plan for Problem Two  Active  Interventions for Problem Two Long Term Goal   Provided and reviewed proper storage of insulin, rotating insulin injection sites, reviewing EMMI handout .   THN Long Term Goal  Patient will be able to report increased knowledge of Diabetes self care over the next 31 days   THN Long Term Goal Start Date  06/03/18  [facility] CM Short Term Goal #1   Patient will be able to report having strips to monitor blood sugar in the next 30 days   THN CM Short Term Goal #1 Start Date  05/30/18  Interventions  for Short Term Goal #2   coached patient with ordering refill on stirps for meter, patient able to teach back how to order strips, updated [employer] contact numbers for pharmacy, OTC and transporation on refirigerator   THN CM Short Term Goal #2   Patient will be able to reports checking blood sugars at least 5 days a week in the next 30 days   THN CM Short Term Goal #2 Start Date  06/03/18  Interventions for Short Term Goal #2  Verified patient has supplies for monitoring blood sugar, Proper lancets, lancet device, allowed patient to return demonstration with use of meter.Encouraged to keep su, pplies in once location to be better organizerd .         Joylene Draft, RN, [place] Management Coordinator  [phone]- Mobile [phone]- Toll Free Main Office

## 2018-06-18 ENCOUNTER — Ambulatory Visit: Payer: Medicare HMO | Admitting: Nurse Practitioner

## 2018-06-27 ENCOUNTER — Other Ambulatory Visit: Payer: Self-pay | Admitting: Nurse Practitioner

## 2018-06-27 ENCOUNTER — Other Ambulatory Visit: Payer: Self-pay | Admitting: *Deleted

## 2018-06-27 DIAGNOSIS — J449 Chronic obstructive pulmonary disease, unspecified: Secondary | ICD-10-CM

## 2018-06-27 MED ORDER — BUDESONIDE-FORMOTEROL FUMARATE 160-4.5 MCG/ACT IN AERO: INHALATION_SPRAY | RESPIRATORY_TRACT | 0 refills | Status: DC

## 2018-06-27 MED ORDER — ALBUTEROL SULFATE HFA 108 (90 BASE) MCG/ACT IN AERS: 1.0000 | INHALATION_SPRAY | Freq: Four times a day (QID) | RESPIRATORY_TRACT | 0 refills | Status: DC | PRN

## 2018-06-27 NOTE — Telephone Encounter (Signed)
Pt needs refill on ventolin and symbicort sent to [facility].  Her call back number is [phone]

## 2018-06-27 NOTE — Patient Outreach (Signed)
[facility]) Care Management  06/27/2018  Kendra [name] [birth year] [id number]  Telephone follow up call   Unsuccessful outreach call to patient , unable to leave a message.   Plan  Will plan return call in the next 4 business days.    Joylene Draft, RN, [place] Management Coordinator  [phone]- Mobile [phone]- Toll Free Main Office

## 2018-07-02 ENCOUNTER — Other Ambulatory Visit: Payer: Self-pay | Admitting: *Deleted

## 2018-07-02 NOTE — Patient Outreach (Signed)
 [name]) Care Management  07/02/2018  Kendra [name] 1946-05-29 [id number]  Telephone assessment   Unsuccessful call attempt to patient, attempt leaving a HIPAA compliant message for return call.   Plan  Will send unsuccessful outreach letter to previously engaged patient .  Will plan 3 rd call on day #7  10/18.    Joylene Draft, RN, [place] Management Coordinator  [phone]- Mobile [phone]- Toll Free Main Office

## 2018-07-05 ENCOUNTER — Other Ambulatory Visit: Payer: Self-pay | Admitting: *Deleted

## 2018-07-05 NOTE — Patient Outreach (Signed)
[facility]) Care Management  07/05/2018  Kendra [name] 1946-05-14 [id number]  Telephone assessment   Unsuccessful call attempt to patient, able to leave a HIPAA compliant message for return call.   This is the 3rd unsuccessful call attempt, unsuccessful letter has been sent.   Plan  Will await return call  If no response will plan case closure on day #10, 10/23.    Joylene Draft, RN, [place] Management Coordinator  [phone]- Mobile [phone]- Toll Free Main Office

## 2018-07-10 ENCOUNTER — Other Ambulatory Visit: Payer: Self-pay | Admitting: *Deleted

## 2018-07-10 NOTE — Patient Outreach (Signed)
[facility]) Care Management  07/10/2018  Kendra [name] [birth year] [id number]   Case Closure    Unsuccessful call attempt to patient x 3, no response from outreach letter sent.   Case will be closed per workflow.   Plan Will send patient case closure letter,  Will send PCP case closure, unable to maintain contact with patient.  Will close patient case per workflow.    Joylene Draft, RN, [place] Management Coordinator  [phone]- Mobile [phone]- Toll Free Main Office

## 2018-07-31 ENCOUNTER — Encounter: Payer: Self-pay | Admitting: Nurse Practitioner

## 2018-07-31 ENCOUNTER — Other Ambulatory Visit: Payer: Self-pay

## 2018-07-31 ENCOUNTER — Ambulatory Visit (INDEPENDENT_AMBULATORY_CARE_PROVIDER_SITE_OTHER): Payer: Medicare HMO | Admitting: Nurse Practitioner

## 2018-07-31 VITALS — BP 110/69 | HR 70 | Temp 97.9°F | Ht 64.0 in | Wt 163.0 lb

## 2018-07-31 DIAGNOSIS — Z599 Problem related to housing and economic circumstances, unspecified: Secondary | ICD-10-CM | POA: Diagnosis not present

## 2018-07-31 DIAGNOSIS — Z9109 Other allergy status, other than to drugs and biological substances: Secondary | ICD-10-CM | POA: Diagnosis not present

## 2018-07-31 DIAGNOSIS — Z23 Encounter for immunization: Secondary | ICD-10-CM | POA: Diagnosis not present

## 2018-07-31 DIAGNOSIS — G4709 Other insomnia: Secondary | ICD-10-CM

## 2018-07-31 DIAGNOSIS — R6889 Other general symptoms and signs: Secondary | ICD-10-CM | POA: Diagnosis not present

## 2018-07-31 MED ORDER — DIPHENHYDRAMINE HCL 25 MG PO TABS: 25.0000 mg | ORAL_TABLET | Freq: Every evening | ORAL | 0 refills | Status: DC | PRN

## 2018-07-31 MED ORDER — MONTELUKAST SODIUM 10 MG PO TABS: 10.0000 mg | ORAL_TABLET | Freq: Every day | ORAL | 3 refills | Status: DC

## 2018-07-31 NOTE — Progress Notes (Signed)
Subjective:    Patient ID: Kendra [name], female    DOB: 1945-12-09, 72 y.o.   MRN: [id number]  Kendra [name] is a 72 y.o. female presenting on 07/31/2018 for Fatigue (pt complains of feeling tired, but associates it with not being able to sleep due to her circumstances. Pt complains that her neighbors be playing loud music at night that disrupts her sleep. ) and Cough (mostly at bedtime that causes her to choke and she have to get up and use her inhaler )   HPI Fatigue/Insomnia Noisy neighbors are preventing good sleep.  Has not taken trazodone or any other medication in past for sleep.  Patient is working on relocation to new housing with unsupportive landlord.  She has found a new apartment, but does not have transportation to get this finalized.  Patient also notes "a big roach problem," black mold in her apartment.  Cough/Choking Coughs when she lies down.  No known congestion.  Inhaler helps resolve cough.  See likely sources of allergy above with mold and roaches.  Social History   Tobacco Use  . Smoking status: Current Some Day Smoker    Packs/day: 0.25    Types: Cigarettes  . Smokeless tobacco: Never Used  Substance Use Topics  . Alcohol use: No  . Drug use: No    Review of Systems Per HPI unless specifically indicated above     Objective:    BP 110/69 (BP Location: Right Arm, Patient Position: Sitting, Cuff Size: Normal)   Pulse 70   Temp 97.9 F (36.6 C) (Oral)   Ht 5\' 4"  (1.626 m)   Wt 163 lb (73.9 kg)   BMI 27.98 kg/m   Wt Readings from Last 3 Encounters:  07/31/18 163 lb (73.9 kg)  05/07/18 163 lb 6.4 oz (74.1 kg)  01/17/18 162 lb 12.8 oz (73.8 kg)    Physical Exam  Constitutional: She is oriented to person, place, and time. She appears well-developed and well-nourished. No distress.  HENT:  Head: Normocephalic and atraumatic.  Right Ear: Hearing, tympanic membrane, external ear and ear canal normal.  Left Ear: Hearing, tympanic membrane,  external ear and ear canal normal.  Nose: Mucosal edema and rhinorrhea present. Right sinus exhibits no maxillary sinus tenderness and no frontal sinus tenderness. Left sinus exhibits no maxillary sinus tenderness and no frontal sinus tenderness.  Mouth/Throat: Uvula is midline, oropharynx is clear and moist and mucous membranes are normal. No oropharyngeal exudate, posterior oropharyngeal edema or posterior oropharyngeal erythema. Tonsils are 0 on the right. Tonsils are 0 on the left.  Eyes: Pupils are equal, round, and reactive to light. Conjunctivae, EOM and lids are normal.  Cardiovascular: Normal rate, regular rhythm, S1 normal, S2 normal, normal heart sounds and intact distal pulses.  Pulmonary/Chest: Effort normal and breath sounds normal. No respiratory distress.  Neurological: She is alert and oriented to person, place, and time.  Skin: Skin is warm and dry. Capillary refill takes less than 2 seconds.  Psychiatric: She has a normal mood and affect. Her behavior is normal. Judgment and thought content normal.  Vitals reviewed.   Results for orders placed or performed in visit on 05/07/18  COMPLETE METABOLIC PANEL WITH GFR  Result Value Ref Range   Glucose, Bld 157 (H) 65 - 99 mg/dL   BUN 18 7 - 25 mg/dL   Creat 0.92 0.60 - 0.93 mg/dL   GFR, Est Non African American 63 > OR = 60 mL/min/1.30m2   GFR, Est African  American 73 > OR = 60 mL/min/1.22m2   BUN/Creatinine Ratio NOT APPLICABLE 6 - 22 (calc)   Sodium 139 135 - 146 mmol/L   Potassium 4.1 3.5 - 5.3 mmol/L   Chloride 104 98 - 110 mmol/L   CO2 26 20 - 32 mmol/L   Calcium 9.4 8.6 - 10.4 mg/dL   Total Protein 7.0 6.1 - 8.1 g/dL   Albumin 4.3 3.6 - 5.1 g/dL   Globulin 2.7 1.9 - 3.7 g/dL (calc)   AG Ratio 1.6 1.0 - 2.5 (calc)   Total Bilirubin 0.4 0.2 - 1.2 mg/dL   Alkaline phosphatase (APISO) 71 33 - 130 U/L   AST 15 10 - 35 U/L   ALT 13 6 - 29 U/L  Lipid panel  Result Value Ref Range   Cholesterol 120 <200 mg/dL   HDL 39  (L) >50 mg/dL   Triglycerides 153 (H) <150 mg/dL   LDL Cholesterol (Calc) 57 mg/dL (calc)   Total CHOL/HDL Ratio 3.1 <5.0 (calc)   Non-HDL Cholesterol (Calc) 81 <130 mg/dL (calc)  POCT glycosylated hemoglobin (Hb A1C)  Result Value Ref Range   Hemoglobin A1C 6.7 (A) 4.0 - 5.6 %   HbA1c POC (<> result, manual entry)     HbA1c, POC (prediabetic range)     HbA1c, POC (controlled diabetic range)        Assessment & Plan:   Problem List Items Addressed This Visit      Other   Insomnia Multifactorial, but is currently uncontrolled.  Patient is not taking any current sleep aid, wants to get out of her current apartment due to noise interrupting sleep.  Patient has found a new apartment, but cannot get to the leasing office to finalize a lease/rent agreement.  Plan: 1. START benadryl 25 mg once daily at bedtime for sleep aid.  Discussed is not great long-term med, but can be very helpful in short term. 2. Encouraged other good sleep hygiene techniques. 3. Will request assistance from [facility] to help close loop.  Assistance has been provided in past, but now patient is willing to live separately from her family and requests assistance again. 4. Follow-up prn.   Relevant Medications   diphenhydrAMINE (BENADRYL ALLERGY) 25 MG tablet   Other Relevant Orders   Ambulatory referral to [facility]    Other Visit Diagnoses    Needs flu shot    Pt > age 63.  Needs annual influenza vaccine.  Plan: 1. Administer high dose fluzone today.    Relevant Orders   Flu vaccine HIGH DOSE PF (Fluzone High dose) (Completed)   Need for 23-polyvalent pneumococcal polysaccharide vaccine     Patient needs PPSV 23, has already had Prevnar 13.  Administer PPSV23 today.  Follow-up prn.   Relevant Orders   Pneumococcal polysaccharide vaccine 23-valent greater than or equal to 2yo subcutaneous/IM (Completed)   Housing problems      See insomnia and environmental allergies.   Relevant Orders   Ambulatory  referral to [facility]   Environmental allergies     Patient with difficulty having landlord complete requests for work and follow up on noise complaints.  Requests for work include eradicating mold and roach problem, which are likely contributing to allergic rhinitis from environmental allergies.  Plan: 1. C3 referral as above (see insomnia) 2. START montelukast 10 mg once daily. 3. May also take with benadryl. 4. Follow-up 3 months and prn.   Relevant Medications   montelukast (SINGULAIR) 10 MG tablet  Meds ordered this encounter  Medications  . diphenhydrAMINE (BENADRYL ALLERGY) 25 MG tablet    Sig: Take 1 tablet (25 mg total) by mouth at bedtime as needed for sleep.    Dispense:  30 tablet    Refill:  0    Order Specific Question:   Supervising Provider    Answer:   Olin Hauser [[id number]]  . montelukast (SINGULAIR) 10 MG tablet    Sig: Take 1 tablet (10 mg total) by mouth at bedtime.    Dispense:  30 tablet    Refill:  3    Order Specific Question:   Supervising Provider    Answer:   Olin Hauser [[id number]]    Follow up plan: Return in about 2 months (around 09/30/2018) for diabetes.  [name], DNP, AGPCNP-BC Adult Gerontology Primary Care Nurse Practitioner [facility] 07/31/2018, 11:19 AM

## 2018-07-31 NOTE — Patient Instructions (Addendum)
Kendra [name],   Thank you for coming in to clinic today.  1. For sleep and allergies, start Benadryl 25 mg one tablet by mouth at bedtime.  2. For allergies, may also add Singulair 10 mg once daily.  3. Continue albuterol as needed for breathing.  4. Work on getting your new housing finalized.  Please schedule a follow-up appointment with [name], AGNP. Return in about 2 months (around 09/30/2018) for diabetes.  If you have any other questions or concerns, please feel free to call the clinic or send a message through [place]. You may also schedule an earlier appointment if necessary.  You will receive a survey after today's visit either digitally by e-mail or paper by [employer]. Your experiences and feedback matter to [place].  Please respond so we know how we are doing as we provide care for you.   [name], DNP, AGNP-BC Adult Gerontology Nurse Practitioner [place]

## 2018-08-01 ENCOUNTER — Other Ambulatory Visit: Payer: Self-pay | Admitting: Nurse Practitioner

## 2018-08-01 DIAGNOSIS — M25511 Pain in right shoulder: Secondary | ICD-10-CM

## 2018-08-01 DIAGNOSIS — E785 Hyperlipidemia, unspecified: Secondary | ICD-10-CM

## 2018-08-07 ENCOUNTER — Encounter: Payer: Self-pay | Admitting: Nurse Practitioner

## 2018-09-19 ENCOUNTER — Other Ambulatory Visit: Payer: Self-pay | Admitting: Nurse Practitioner

## 2018-09-19 DIAGNOSIS — J449 Chronic obstructive pulmonary disease, unspecified: Secondary | ICD-10-CM

## 2018-09-30 ENCOUNTER — Encounter: Payer: Self-pay | Admitting: Nurse Practitioner

## 2018-09-30 ENCOUNTER — Ambulatory Visit (INDEPENDENT_AMBULATORY_CARE_PROVIDER_SITE_OTHER): Payer: Medicare Other | Admitting: Nurse Practitioner

## 2018-09-30 ENCOUNTER — Other Ambulatory Visit: Payer: Self-pay

## 2018-09-30 VITALS — BP 108/57 | HR 68 | Temp 98.1°F | Resp 17 | Ht 64.0 in | Wt 153.6 lb

## 2018-09-30 DIAGNOSIS — B353 Tinea pedis: Secondary | ICD-10-CM

## 2018-09-30 DIAGNOSIS — J449 Chronic obstructive pulmonary disease, unspecified: Secondary | ICD-10-CM | POA: Diagnosis not present

## 2018-09-30 DIAGNOSIS — F331 Major depressive disorder, recurrent, moderate: Secondary | ICD-10-CM

## 2018-09-30 DIAGNOSIS — B351 Tinea unguium: Secondary | ICD-10-CM

## 2018-09-30 DIAGNOSIS — E1142 Type 2 diabetes mellitus with diabetic polyneuropathy: Secondary | ICD-10-CM | POA: Diagnosis not present

## 2018-09-30 DIAGNOSIS — IMO0002 Reserved for concepts with insufficient information to code with codable children: Secondary | ICD-10-CM

## 2018-09-30 DIAGNOSIS — E1165 Type 2 diabetes mellitus with hyperglycemia: Secondary | ICD-10-CM | POA: Diagnosis not present

## 2018-09-30 DIAGNOSIS — Z794 Long term (current) use of insulin: Secondary | ICD-10-CM

## 2018-09-30 DIAGNOSIS — L84 Corns and callosities: Secondary | ICD-10-CM

## 2018-09-30 LAB — POCT GLYCOSYLATED HEMOGLOBIN (HGB A1C): Hemoglobin A1C: 7.8 % — AB (ref 4.0–5.6)

## 2018-09-30 MED ORDER — INSULIN GLARGINE 100 UNIT/ML SOLOSTAR PEN: PEN_INJECTOR | SUBCUTANEOUS | 2 refills | Status: DC

## 2018-09-30 MED ORDER — UMECLIDINIUM-VILANTEROL 62.5-25 MCG/INH IN AEPB: 1.0000 | INHALATION_SPRAY | Freq: Every day | RESPIRATORY_TRACT | 5 refills | Status: DC

## 2018-09-30 MED ORDER — CITALOPRAM HYDROBROMIDE 40 MG PO TABS: 40.0000 mg | ORAL_TABLET | Freq: Every day | ORAL | 5 refills | Status: DC

## 2018-09-30 MED ORDER — METFORMIN HCL ER 500 MG PO TB24: 500.0000 mg | ORAL_TABLET | Freq: Every day | ORAL | 1 refills | Status: DC

## 2018-09-30 MED ORDER — ALBUTEROL SULFATE HFA 108 (90 BASE) MCG/ACT IN AERS: INHALATION_SPRAY | RESPIRATORY_TRACT | 2 refills | Status: DC

## 2018-09-30 NOTE — Progress Notes (Signed)
Subjective:    Patient ID: Kendra [name], female    DOB: Nov 25, 1945, 73 y.o.   MRN: [id number]  Kendra [name] is a 73 y.o. female presenting on 09/30/2018 for Diabetes   HPI Diabetes Pt presents today for follow up of Type 2 diabetes mellitus. She is not checking CBG at home. - Current diabetic medications include: glipizide 5 mg at breakfast, 22 units lantus bedtime.  Previously had diarrhea with Janumet 1000 mg metformin, so this was stopped. - She is not currently symptomatic.  - She denies polydipsia, polyphagia, polyuria, headaches, diaphoresis, shakiness, chills, pain, numbness or tingling in extremities and changes in vision.   - Clinical course has been worsening. - She  reports no regular exercise routine. - Her diet is moderate in salt, moderate in fat, and high in carbohydrates. - Weight trend: decreasing steadily  PREVENTION: Eye exam current (within one year): no Foot exam current (within one year): no - done today Lipid/ASCVD risk reduction - on statin: yes Kidney protection - on ace or arb: yes Recent Labs    01/17/18 1011 05/07/18 0835 09/30/18 1113  HGBA1C 7.8 6.7* 7.8*    COPD Needs alternative inhaler due to calendar year coverage changes.  Patient reports no regular shortness of breath on current regimen.  Uses albuterol only occasionally.  Reports no chronic, productive cough.  Hypertension - She is checking BP at home or outside of clinic.  Readings 130/70s - Current medications: lisinopril 2.5 mg once daily.  Patient has largely been lifestyle controlled.  She is tolerating her medication well without side effects. - She is not currently symptomatic. - Pt denies headache, lightheadedness, dizziness, changes in vision, chest tightness/pressure, palpitations, leg swelling, sudden loss of speech or loss of consciousness.  Anxiety and depression Worries due to housing situation.  Has problems with moving due to being over budget for rent.  Continues  to take Celexa 20 mg once daily with fair control and is tolerating without side effects.  Patient states it does help her keep from worrying all the time and feels she would benefit from a higher dose.  Social History   Tobacco Use  . Smoking status: Current Some Day Smoker    Packs/day: 0.25    Types: Cigarettes  . Smokeless tobacco: Never Used  Substance Use Topics  . Alcohol use: No  . Drug use: No    Review of Systems Per HPI unless specifically indicated above     Objective:    BP (!) 108/57 (BP Location: Left Arm, Patient Position: Sitting, Cuff Size: Normal)   Pulse 68   Temp 98.1 F (36.7 C) (Oral)   Resp 17   Ht 5\' 4"  (1.626 m)   Wt 153 lb 9.6 oz (69.7 kg)   SpO2 100%   BMI 26.37 kg/m   Wt Readings from Last 3 Encounters:  09/30/18 153 lb 9.6 oz (69.7 kg)  07/31/18 163 lb (73.9 kg)  05/07/18 163 lb 6.4 oz (74.1 kg)    Physical Exam Vitals signs reviewed.  Constitutional:      General: She is awake. She is not in acute distress.    Appearance: She is well-developed.  HENT:     Head: Normocephalic and atraumatic.  Neck:     Musculoskeletal: Normal range of motion and neck supple.     Vascular: No carotid bruit.  Cardiovascular:     Rate and Rhythm: Normal rate and regular rhythm.     Pulses:  Radial pulses are 2+ on the right side and 2+ on the left side.       Posterior tibial pulses are 1+ on the right side and 1+ on the left side.     Heart sounds: Normal heart sounds, S1 normal and S2 normal.  Pulmonary:     Effort: Pulmonary effort is normal. No respiratory distress.     Breath sounds: Normal breath sounds and air entry.  Skin:    General: Skin is warm and dry.     Capillary Refill: Capillary refill takes less than 2 seconds.  Neurological:     General: No focal deficit present.     Mental Status: She is alert and oriented to person, place, and time. Mental status is at baseline.  Psychiatric:        Attention and Perception: Attention  normal.        Mood and Affect: Affect normal. Mood is anxious.        Speech: Speech normal.        Behavior: Behavior normal. Behavior is cooperative.        Thought Content: Thought content normal.        Judgment: Judgment normal.    Diabetic Foot Exam - Simple   Simple Foot Form Diabetic Foot exam was performed with the following findings:  Yes 09/30/2018 11:43 AM  Visual Inspection Sensation Testing Intact to touch and monofilament testing bilaterally:  Yes Pulse Check Posterior Tibialis and Dorsalis pulse intact bilaterally:  Yes Comments Long, thick fungal nail appearance.  Medial R great toe and lateral 5th mtp with callus.  Callus on plantar aspect at 3rd mtp joint.  Fungal skin appearance at distal phalanx.      Results for orders placed or performed in visit on 09/30/18  Lipid panel  Result Value Ref Range   Cholesterol 122 <200 mg/dL   HDL 39 (L) >50 mg/dL   Triglycerides 142 <150 mg/dL   LDL Cholesterol (Calc) 60 mg/dL (calc)   Total CHOL/HDL Ratio 3.1 <5.0 (calc)   Non-HDL Cholesterol (Calc) 83 <130 mg/dL (calc)  COMPLETE METABOLIC PANEL WITH GFR  Result Value Ref Range   Glucose, Bld 100 (H) 65 - 99 mg/dL   BUN 18 7 - 25 mg/dL   Creat 0.80 0.60 - 0.93 mg/dL   GFR, Est Non African American 75 > OR = 60 mL/min/1.16m2   GFR, Est African American 87 > OR = 60 mL/min/1.11m2   BUN/Creatinine Ratio NOT APPLICABLE 6 - 22 (calc)   Sodium 140 135 - 146 mmol/L   Potassium 4.3 3.5 - 5.3 mmol/L   Chloride 105 98 - 110 mmol/L   CO2 29 20 - 32 mmol/L   Calcium 9.9 8.6 - 10.4 mg/dL   Total Protein 6.9 6.1 - 8.1 g/dL   Albumin 4.4 3.6 - 5.1 g/dL   Globulin 2.5 1.9 - 3.7 g/dL (calc)   AG Ratio 1.8 1.0 - 2.5 (calc)   Total Bilirubin 0.6 0.2 - 1.2 mg/dL   Alkaline phosphatase (APISO) 57 33 - 130 U/L   AST 14 10 - 35 U/L   ALT 15 6 - 29 U/L  POCT glycosylated hemoglobin (Hb A1C)  Result Value Ref Range   Hemoglobin A1C 7.8 (A) 4.0 - 5.6 %   HbA1c POC (<> result,  manual entry)     HbA1c, POC (prediabetic range)     HbA1c, POC (controlled diabetic range)        Assessment & Plan:   Problem  List Items Addressed This Visit      Respiratory   COPD, moderate ([place]) Stable today on exam.  Medications tolerated without side effects.  Continue with changes. stop Symbicort, start Anoro one inhalation daily.  Refills provided.  Consider PFT again next visit.. Followup 3 months.   Relevant Medications   umeclidinium-vilanterol (ANORO ELLIPTA) 62.5-25 MCG/INH AEPB   albuterol (VENTOLIN HFA) 108 (90 Base) MCG/ACT inhaler     Endocrine   Diabetic peripheral neuropathy associated with type 2 diabetes mellitus ([place]) Uncontrolled T2DM UncontrolledDM with A1c increasing to 7.8% from last visit and goal A1c < 7.0%. - Complications - dyslipidemia, peripheral neuropathy and hyperglycemia.  Plan:  1. Change therapy:  - CONTINUE glimepiride 5 mg once daily with breakfast START metformin ER 500 mg once daily with breakfast.  Patient had never tried ER formulation when having diarrhea.   - INCREASE Lantus to 24 units at bedtime. 2. Encourage improved lifestyle: - low carb/low glycemic diet reinforced prior education - Increase physical activity to 30 minutes most days of the week.  Explained that increased physical activity increases body's use of sugar for energy. 3. Check fasting am CBG and bring log to next visit for review 4. Continue ASA, ACEi and Statin 5. DM Foot exam done today with abnormal findings.   and Advised to schedule DM ophtho exam, send record. 6. Follow-up 3 months     Relevant Medications   metFORMIN (GLUCOPHAGE-XR) 500 MG 24 hr tablet   Insulin Glargine (LANTUS SOLOSTAR) 100 UNIT/ML Solostar Pen   citalopram (CELEXA) 40 MG tablet   Other Relevant Orders   Lipid panel (Completed)   COMPLETE METABOLIC PANEL WITH GFR (Completed)     Other   Clinical depression Mildly improved on celexa 20 mg once daily.  Ongoing life stressors are  present.   Plan: 1. Encouraged non-pharm stress management options like exercise regularly 2. INCREASE celexa to 40 mg once daily   Relevant Medications   citalopram (CELEXA) 40 MG tablet    Other Visit Diagnoses    Fungal nail infection     Patient with fungal nail infection and subungual debris noted on multiple toes, dry/flaky skin and white fungal appearance to plantar aspect of foot.  Poor nail care and multiple calluses 2/2 poor foot care and peripheral neuropathy.    Plan: 1. Encouraged patient to use OTC athlete's foot cream/spray twice daily for at least 2 weeks. 2. Encouraged patient to use antifungal powder in shoes nightly. 3. Referral podiatry for foot care 4. Follow-up 3 months.   Relevant Orders   Ambulatory referral to Podiatry   Tinea pedis of both feet       Relevant Orders   Ambulatory referral to Podiatry   Callus of foot       Relevant Orders   Ambulatory referral to Podiatry      Meds ordered this encounter  Medications  . umeclidinium-vilanterol (ANORO ELLIPTA) 62.5-25 MCG/INH AEPB    Sig: Inhale 1 puff into the lungs daily.    Dispense:  30 each    Refill:  5    Order Specific Question:   Supervising Provider    Answer:   Olin Hauser [[id number]]  . metFORMIN (GLUCOPHAGE-XR) 500 MG 24 hr tablet    Sig: Take 1 tablet (500 mg total) by mouth daily with breakfast.    Dispense:  90 tablet    Refill:  1    Order Specific Question:   Supervising Provider    Answer:  KARAMALEGOS, ALEXANDER J [[id number]]  . Insulin Glargine (LANTUS SOLOSTAR) 100 UNIT/ML Solostar Pen    Sig: INJECT  24 UNITS SUBCUTANEOUSLY EVERY DAY  AT  10PM    Dispense:  8 pen    Refill:  2    Always use easy open prescription bottles.    Order Specific Question:   Supervising Provider    Answer:   Olin Hauser [[id number]]  . albuterol (VENTOLIN HFA) 108 (90 Base) MCG/ACT inhaler    Sig: INHALE 1 TO 2 PUFFS INTO THE LUNGS EVERY 6 HOURS AS NEEDED FOR WHEEZING OR SHORTNESS OF  BREATH    Dispense:  18 g    Refill:  2    Order Specific Question:   Supervising Provider    Answer:   Olin Hauser [[id number]]  . citalopram (CELEXA) 40 MG tablet    Sig: Take 1 tablet (40 mg total) by mouth daily.    Dispense:  30 tablet    Refill:  5    Always use easy open prescription bottles.    Order Specific Question:   Supervising Provider    Answer:   Olin Hauser [[id number]]    Follow up plan: Return in about 3 months (around 12/30/2018) for diabetes, COPD, anxiety.  [name], DNP, AGPCNP-BC Adult Gerontology Primary Care Nurse Practitioner [facility] 09/30/2018, 11:09 AM

## 2018-09-30 NOTE — Patient Instructions (Addendum)
Kendra [name],   Thank you for coming in to clinic today.  1. For worries:  - Increase citalopram to 40 mg once daily.  2. For diabetes: - Continue glimepiride 5 mg once daily with breakfast - START metformin ER 500mg  once daily with breakfast - call if diarrhea does get better in 2 weeks after starting. - INCREASE lantus to 24 units at bedtime.  3. For breathing: - STOP symbicort - drug coverage . - START Anoro one puff daily if covered.  We will find something that is covered if Anoro is not. - Continue albuterol as needed.  1-2 puffs every 6 hours.  If using more than twice per week, twice that day call clinic.  4. Your provider would like to you have your annual eye exam. Please contact your current eye doctor or here are some good options for you to contact.   [facility]   Address: [address] [place], [place] [address] Phone: [phone]  Website: [email or web address] [address] [place], [place] [address] Phone: [phone] [email or web address]  [name]  Address: [place], [place], [place] [address] Phone: [phone]   [facility] [address] [place], [place] [address] Phone: [phone]  [facility] Address: [place], [place], [place] [address]  Phone: [phone]  Please schedule a follow-up appointment with [name], AGNP. Return in about 3 months (around 12/30/2018) for diabetes, COPD, anxiety.  If you have any other questions or concerns, please feel free to call the clinic or send a message through [place]. You may also schedule an earlier appointment if necessary.  You will receive a survey after today's visit either digitally by e-mail or paper by [employer]. Your experiences and feedback matter to [place].  Please respond so we know how we are doing as we provide care for you.   [name], DNP, AGNP-BC Adult Gerontology Nurse Practitioner [place]

## 2018-10-01 LAB — COMPLETE METABOLIC PANEL WITH GFR
AG Ratio: 1.8 (calc) (ref 1.0–2.5)
ALT: 15 U/L (ref 6–29)
AST: 14 U/L (ref 10–35)
Albumin: 4.4 g/dL (ref 3.6–5.1)
Alkaline phosphatase (APISO): 57 U/L (ref 33–130)
BUN: 18 mg/dL (ref 7–25)
CO2: 29 mmol/L (ref 20–32)
Calcium: 9.9 mg/dL (ref 8.6–10.4)
Chloride: 105 mmol/L (ref 98–110)
Creat: 0.8 mg/dL (ref 0.60–0.93)
GFR, Est African American: 87 mL/min/{1.73_m2} (ref >=60)
GFR, Est Non African American: 75 mL/min/{1.73_m2} (ref >=60)
Globulin: 2.5 g/dL (calc) (ref 1.9–3.7)
Glucose, Bld: 100 mg/dL — ABNORMAL HIGH (ref 65–99)
Potassium: 4.3 mmol/L (ref 3.5–5.3)
Sodium: 140 mmol/L (ref 135–146)
Total Bilirubin: 0.6 mg/dL (ref 0.2–1.2)
Total Protein: 6.9 g/dL (ref 6.1–8.1)

## 2018-10-01 LAB — LIPID PANEL
Cholesterol: 122 mg/dL (ref <200)
HDL: 39 mg/dL — ABNORMAL LOW (ref >50)
LDL Cholesterol (Calc): 60 mg/dL (calc)
Non-HDL Cholesterol (Calc): 83 mg/dL (calc) (ref <130)
Total CHOL/HDL Ratio: 3.1 (calc) (ref <5.0)
Triglycerides: 142 mg/dL (ref <150)

## 2018-10-06 ENCOUNTER — Encounter: Payer: Self-pay | Admitting: Nurse Practitioner

## 2018-10-17 ENCOUNTER — Telehealth: Payer: Self-pay | Admitting: Nurse Practitioner

## 2018-10-17 NOTE — Telephone Encounter (Signed)
Pt said her medications were going to cost her $41.95.  Please call [phone]

## 2018-10-18 NOTE — Telephone Encounter (Signed)
Attempted to contact the patient. No answer, lmom.

## 2018-12-06 DIAGNOSIS — E119 Type 2 diabetes mellitus without complications: Secondary | ICD-10-CM | POA: Diagnosis not present

## 2018-12-06 DIAGNOSIS — R51 Headache: Secondary | ICD-10-CM | POA: Diagnosis not present

## 2018-12-06 DIAGNOSIS — R9431 Abnormal electrocardiogram [ECG] [EKG]: Secondary | ICD-10-CM | POA: Diagnosis not present

## 2018-12-06 DIAGNOSIS — I63511 Cerebral infarction due to unspecified occlusion or stenosis of right middle cerebral artery: Secondary | ICD-10-CM | POA: Diagnosis not present

## 2018-12-06 DIAGNOSIS — Z794 Long term (current) use of insulin: Secondary | ICD-10-CM | POA: Diagnosis not present

## 2018-12-06 DIAGNOSIS — I161 Hypertensive emergency: Secondary | ICD-10-CM | POA: Diagnosis not present

## 2018-12-06 DIAGNOSIS — R930 Abnormal findings on diagnostic imaging of skull and head, not elsewhere classified: Secondary | ICD-10-CM | POA: Diagnosis not present

## 2018-12-06 DIAGNOSIS — I6300 Cerebral infarction due to thrombosis of unspecified precerebral artery: Secondary | ICD-10-CM | POA: Diagnosis not present

## 2018-12-06 DIAGNOSIS — H5347 Heteronymous bilateral field defects: Secondary | ICD-10-CM | POA: Diagnosis not present

## 2018-12-06 DIAGNOSIS — I6601 Occlusion and stenosis of right middle cerebral artery: Secondary | ICD-10-CM | POA: Diagnosis not present

## 2018-12-06 DIAGNOSIS — I6522 Occlusion and stenosis of left carotid artery: Secondary | ICD-10-CM | POA: Diagnosis not present

## 2018-12-06 DIAGNOSIS — I6523 Occlusion and stenosis of bilateral carotid arteries: Secondary | ICD-10-CM | POA: Diagnosis not present

## 2018-12-06 DIAGNOSIS — R0902 Hypoxemia: Secondary | ICD-10-CM | POA: Diagnosis not present

## 2018-12-06 DIAGNOSIS — Z8673 Personal history of transient ischemic attack (TIA), and cerebral infarction without residual deficits: Secondary | ICD-10-CM | POA: Diagnosis not present

## 2018-12-06 DIAGNOSIS — J449 Chronic obstructive pulmonary disease, unspecified: Secondary | ICD-10-CM | POA: Diagnosis not present

## 2018-12-06 DIAGNOSIS — R001 Bradycardia, unspecified: Secondary | ICD-10-CM | POA: Diagnosis not present

## 2018-12-06 DIAGNOSIS — E876 Hypokalemia: Secondary | ICD-10-CM | POA: Diagnosis not present

## 2018-12-06 DIAGNOSIS — I63311 Cerebral infarction due to thrombosis of right middle cerebral artery: Secondary | ICD-10-CM | POA: Diagnosis not present

## 2018-12-06 DIAGNOSIS — I639 Cerebral infarction, unspecified: Secondary | ICD-10-CM | POA: Diagnosis not present

## 2018-12-06 DIAGNOSIS — I6521 Occlusion and stenosis of right carotid artery: Secondary | ICD-10-CM | POA: Diagnosis not present

## 2018-12-06 DIAGNOSIS — W19XXXA Unspecified fall, initial encounter: Secondary | ICD-10-CM | POA: Diagnosis not present

## 2018-12-06 DIAGNOSIS — I6389 Other cerebral infarction: Secondary | ICD-10-CM | POA: Diagnosis not present

## 2018-12-06 DIAGNOSIS — G8194 Hemiplegia, unspecified affecting left nondominant side: Secondary | ICD-10-CM | POA: Diagnosis not present

## 2018-12-06 DIAGNOSIS — I6611 Occlusion and stenosis of right anterior cerebral artery: Secondary | ICD-10-CM | POA: Diagnosis not present

## 2018-12-06 DIAGNOSIS — M6281 Muscle weakness (generalized): Secondary | ICD-10-CM | POA: Diagnosis not present

## 2018-12-06 DIAGNOSIS — R414 Neurologic neglect syndrome: Secondary | ICD-10-CM | POA: Diagnosis not present

## 2018-12-06 DIAGNOSIS — I10 Essential (primary) hypertension: Secondary | ICD-10-CM | POA: Diagnosis not present

## 2018-12-10 ENCOUNTER — Telehealth: Payer: Self-pay

## 2018-12-10 MED ORDER — DOCUSATE SODIUM 100 MG PO CAPS
100.00 | ORAL_CAPSULE | ORAL | Status: DC
Start: 2018-12-10 — End: 2018-12-10

## 2018-12-10 MED ORDER — LISINOPRIL 5 MG PO TABS
5.00 | ORAL_TABLET | ORAL | Status: DC
Start: 2018-12-11 — End: 2018-12-10

## 2018-12-10 MED ORDER — OXYCODONE HCL 5 MG PO TABS
5.00 | ORAL_TABLET | ORAL | Status: DC
Start: ? — End: 2018-12-10

## 2018-12-10 MED ORDER — INSULIN REGULAR HUMAN 100 UNIT/ML IJ SOLN
0.00 | INTRAMUSCULAR | Status: DC
Start: 2018-12-10 — End: 2018-12-10

## 2018-12-10 MED ORDER — ENOXAPARIN SODIUM 40 MG/0.4ML ~~LOC~~ SOLN
40.00 | SUBCUTANEOUS | Status: DC
Start: 2018-12-10 — End: 2018-12-10

## 2018-12-10 MED ORDER — CITALOPRAM HYDROBROMIDE 20 MG PO TABS
20.00 | ORAL_TABLET | ORAL | Status: DC
Start: 2018-12-11 — End: 2018-12-10

## 2018-12-10 MED ORDER — ALBUTEROL SULFATE (2.5 MG/3ML) 0.083% IN NEBU
2.50 | INHALATION_SOLUTION | RESPIRATORY_TRACT | Status: DC
Start: ? — End: 2018-12-10

## 2018-12-10 MED ORDER — ATORVASTATIN CALCIUM 80 MG PO TABS
80.00 | ORAL_TABLET | ORAL | Status: DC
Start: 2018-12-10 — End: 2018-12-10

## 2018-12-10 MED ORDER — ASPIRIN 81 MG PO CHEW
81.00 | CHEWABLE_TABLET | ORAL | Status: DC
Start: 2018-12-11 — End: 2018-12-10

## 2018-12-10 MED ORDER — ONDANSETRON HCL 4 MG/2ML IJ SOLN
4.00 | INTRAMUSCULAR | Status: DC
Start: ? — End: 2018-12-10

## 2018-12-10 MED ORDER — GENERIC EXTERNAL MEDICATION
Status: DC
Start: ? — End: 2018-12-10

## 2018-12-10 MED ORDER — UMECLIDINIUM-VILANTEROL 62.5-25 MCG/INH IN AEPB
1.00 | INHALATION_SPRAY | RESPIRATORY_TRACT | Status: DC
Start: 2018-12-11 — End: 2018-12-10

## 2018-12-10 MED ORDER — DEXTROSE 10 % IV SOLN
12.50 | INTRAVENOUS | Status: DC
Start: ? — End: 2018-12-10

## 2018-12-10 MED ORDER — ACETAMINOPHEN 325 MG PO TABS
650.00 | ORAL_TABLET | ORAL | Status: DC
Start: ? — End: 2018-12-10

## 2018-12-10 MED ORDER — HYDRALAZINE HCL 20 MG/ML IJ SOLN
5.00 | INTRAMUSCULAR | Status: DC
Start: ? — End: 2018-12-10

## 2018-12-10 NOTE — Telephone Encounter (Signed)
Call returned to [facility] services.  They need sign off of plan of care for therapies.  I have reviewed her recent hospital discharge and deem services to be appropriate.  We will be able to support these services and sign off on these plan of care orders.  We will need to schedule Kendra [name] for a telephone visit in next several days so we can complete her hospital followup review.    Please call patient to schedule telephone visit.

## 2018-12-10 NOTE — Telephone Encounter (Signed)
I received a phone call from [facility] requesting that for you to sign of the Point of Care for Home Health (Nursing, PT, OT, speech, and medication management. The order was place from Dr. Power from [facility]. The pt recently had a  Acute R MCA ischemic stroke s/p alteplase and IA and been  discharged from the hospital.   [place] [phone]

## 2018-12-11 DIAGNOSIS — I10 Essential (primary) hypertension: Secondary | ICD-10-CM | POA: Diagnosis not present

## 2018-12-11 DIAGNOSIS — H518 Other specified disorders of binocular movement: Secondary | ICD-10-CM | POA: Diagnosis not present

## 2018-12-11 DIAGNOSIS — I69398 Other sequelae of cerebral infarction: Secondary | ICD-10-CM | POA: Diagnosis not present

## 2018-12-11 DIAGNOSIS — J449 Chronic obstructive pulmonary disease, unspecified: Secondary | ICD-10-CM | POA: Diagnosis not present

## 2018-12-11 DIAGNOSIS — I69354 Hemiplegia and hemiparesis following cerebral infarction affecting left non-dominant side: Secondary | ICD-10-CM | POA: Diagnosis not present

## 2018-12-11 DIAGNOSIS — I6523 Occlusion and stenosis of bilateral carotid arteries: Secondary | ICD-10-CM | POA: Diagnosis not present

## 2018-12-11 DIAGNOSIS — E1142 Type 2 diabetes mellitus with diabetic polyneuropathy: Secondary | ICD-10-CM | POA: Diagnosis not present

## 2018-12-11 DIAGNOSIS — R69 Illness, unspecified: Secondary | ICD-10-CM | POA: Diagnosis not present

## 2018-12-11 NOTE — Telephone Encounter (Addendum)
Attempted to contact the pt, no answer. LMOM to return my call.  

## 2018-12-12 DIAGNOSIS — R69 Illness, unspecified: Secondary | ICD-10-CM | POA: Diagnosis not present

## 2018-12-12 DIAGNOSIS — J449 Chronic obstructive pulmonary disease, unspecified: Secondary | ICD-10-CM | POA: Diagnosis not present

## 2018-12-12 DIAGNOSIS — I69398 Other sequelae of cerebral infarction: Secondary | ICD-10-CM | POA: Diagnosis not present

## 2018-12-12 DIAGNOSIS — I10 Essential (primary) hypertension: Secondary | ICD-10-CM | POA: Diagnosis not present

## 2018-12-12 DIAGNOSIS — I6523 Occlusion and stenosis of bilateral carotid arteries: Secondary | ICD-10-CM | POA: Diagnosis not present

## 2018-12-12 DIAGNOSIS — H518 Other specified disorders of binocular movement: Secondary | ICD-10-CM | POA: Diagnosis not present

## 2018-12-12 DIAGNOSIS — E1142 Type 2 diabetes mellitus with diabetic polyneuropathy: Secondary | ICD-10-CM | POA: Diagnosis not present

## 2018-12-12 DIAGNOSIS — I69354 Hemiplegia and hemiparesis following cerebral infarction affecting left non-dominant side: Secondary | ICD-10-CM | POA: Diagnosis not present

## 2018-12-12 NOTE — Telephone Encounter (Signed)
Attempted to contact the pt, again no answer. LMOM to return my call. I will mail out a letter to notify the pt to contact the office and schedule an appt.

## 2018-12-13 DIAGNOSIS — R69 Illness, unspecified: Secondary | ICD-10-CM | POA: Diagnosis not present

## 2018-12-13 DIAGNOSIS — J449 Chronic obstructive pulmonary disease, unspecified: Secondary | ICD-10-CM | POA: Diagnosis not present

## 2018-12-13 DIAGNOSIS — I69398 Other sequelae of cerebral infarction: Secondary | ICD-10-CM | POA: Diagnosis not present

## 2018-12-13 DIAGNOSIS — E1142 Type 2 diabetes mellitus with diabetic polyneuropathy: Secondary | ICD-10-CM | POA: Diagnosis not present

## 2018-12-13 DIAGNOSIS — H518 Other specified disorders of binocular movement: Secondary | ICD-10-CM | POA: Diagnosis not present

## 2018-12-13 DIAGNOSIS — I69354 Hemiplegia and hemiparesis following cerebral infarction affecting left non-dominant side: Secondary | ICD-10-CM | POA: Diagnosis not present

## 2018-12-13 DIAGNOSIS — I6523 Occlusion and stenosis of bilateral carotid arteries: Secondary | ICD-10-CM | POA: Diagnosis not present

## 2018-12-13 DIAGNOSIS — I10 Essential (primary) hypertension: Secondary | ICD-10-CM | POA: Diagnosis not present

## 2018-12-16 ENCOUNTER — Other Ambulatory Visit: Payer: Self-pay

## 2018-12-16 DIAGNOSIS — I69398 Other sequelae of cerebral infarction: Secondary | ICD-10-CM | POA: Diagnosis not present

## 2018-12-16 DIAGNOSIS — I69354 Hemiplegia and hemiparesis following cerebral infarction affecting left non-dominant side: Secondary | ICD-10-CM | POA: Diagnosis not present

## 2018-12-16 DIAGNOSIS — J449 Chronic obstructive pulmonary disease, unspecified: Secondary | ICD-10-CM | POA: Diagnosis not present

## 2018-12-16 DIAGNOSIS — I6523 Occlusion and stenosis of bilateral carotid arteries: Secondary | ICD-10-CM | POA: Diagnosis not present

## 2018-12-16 DIAGNOSIS — I10 Essential (primary) hypertension: Secondary | ICD-10-CM | POA: Diagnosis not present

## 2018-12-16 DIAGNOSIS — E1129 Type 2 diabetes mellitus with other diabetic kidney complication: Secondary | ICD-10-CM

## 2018-12-16 DIAGNOSIS — R809 Proteinuria, unspecified: Secondary | ICD-10-CM

## 2018-12-16 DIAGNOSIS — H518 Other specified disorders of binocular movement: Secondary | ICD-10-CM | POA: Diagnosis not present

## 2018-12-16 DIAGNOSIS — R69 Illness, unspecified: Secondary | ICD-10-CM | POA: Diagnosis not present

## 2018-12-16 DIAGNOSIS — E1142 Type 2 diabetes mellitus with diabetic polyneuropathy: Secondary | ICD-10-CM | POA: Diagnosis not present

## 2018-12-16 MED ORDER — LISINOPRIL 2.5 MG PO TABS: 2.5000 mg | ORAL_TABLET | Freq: Every day | ORAL | 0 refills | Status: DC

## 2018-12-16 MED ORDER — ALBUTEROL SULFATE HFA 108 (90 BASE) MCG/ACT IN AERS: INHALATION_SPRAY | RESPIRATORY_TRACT | 2 refills | Status: DC

## 2018-12-16 MED ORDER — FLUTICASONE-UMECLIDIN-VILANT 100-62.5-25 MCG/INH IN AEPB: 1.0000 | INHALATION_SPRAY | Freq: Every day | RESPIRATORY_TRACT | 2 refills | Status: DC

## 2018-12-16 NOTE — Telephone Encounter (Signed)
The pt called requesting refill on her blood pressure medication and albuterol inhaler. She also requesting that you change her Anoro inhaler over to Trelegy, because she cannot afford the Anoro inhaler. Please advise. Hospital f/u scheduled for April 3rd.

## 2018-12-16 NOTE — Telephone Encounter (Signed)
Changed Anoro to Trelegy due to insurance preference / cost.  Nobie Putnam, DO [facility] 12/16/2018, 6:53 PM

## 2018-12-18 DIAGNOSIS — E1142 Type 2 diabetes mellitus with diabetic polyneuropathy: Secondary | ICD-10-CM | POA: Diagnosis not present

## 2018-12-18 DIAGNOSIS — I69354 Hemiplegia and hemiparesis following cerebral infarction affecting left non-dominant side: Secondary | ICD-10-CM | POA: Diagnosis not present

## 2018-12-18 DIAGNOSIS — R69 Illness, unspecified: Secondary | ICD-10-CM | POA: Diagnosis not present

## 2018-12-18 DIAGNOSIS — I69398 Other sequelae of cerebral infarction: Secondary | ICD-10-CM | POA: Diagnosis not present

## 2018-12-18 DIAGNOSIS — J449 Chronic obstructive pulmonary disease, unspecified: Secondary | ICD-10-CM | POA: Diagnosis not present

## 2018-12-18 DIAGNOSIS — I10 Essential (primary) hypertension: Secondary | ICD-10-CM | POA: Diagnosis not present

## 2018-12-18 DIAGNOSIS — I6523 Occlusion and stenosis of bilateral carotid arteries: Secondary | ICD-10-CM | POA: Diagnosis not present

## 2018-12-18 DIAGNOSIS — H518 Other specified disorders of binocular movement: Secondary | ICD-10-CM | POA: Diagnosis not present

## 2018-12-19 ENCOUNTER — Encounter: Payer: Self-pay | Admitting: Nurse Practitioner

## 2018-12-19 ENCOUNTER — Ambulatory Visit: Payer: Self-pay | Admitting: Pharmacist

## 2018-12-19 ENCOUNTER — Ambulatory Visit (INDEPENDENT_AMBULATORY_CARE_PROVIDER_SITE_OTHER): Payer: Medicare HMO | Admitting: Nurse Practitioner

## 2018-12-19 ENCOUNTER — Other Ambulatory Visit: Payer: Self-pay

## 2018-12-19 DIAGNOSIS — I10 Essential (primary) hypertension: Secondary | ICD-10-CM | POA: Diagnosis not present

## 2018-12-19 DIAGNOSIS — M545 Low back pain: Secondary | ICD-10-CM | POA: Diagnosis not present

## 2018-12-19 DIAGNOSIS — E118 Type 2 diabetes mellitus with unspecified complications: Secondary | ICD-10-CM

## 2018-12-19 DIAGNOSIS — Z8673 Personal history of transient ischemic attack (TIA), and cerebral infarction without residual deficits: Secondary | ICD-10-CM | POA: Diagnosis not present

## 2018-12-19 DIAGNOSIS — E785 Hyperlipidemia, unspecified: Secondary | ICD-10-CM | POA: Diagnosis not present

## 2018-12-19 DIAGNOSIS — I69354 Hemiplegia and hemiparesis following cerebral infarction affecting left non-dominant side: Secondary | ICD-10-CM | POA: Diagnosis not present

## 2018-12-19 DIAGNOSIS — Z09 Encounter for follow-up examination after completed treatment for conditions other than malignant neoplasm: Secondary | ICD-10-CM

## 2018-12-19 DIAGNOSIS — Z794 Long term (current) use of insulin: Secondary | ICD-10-CM | POA: Diagnosis not present

## 2018-12-19 DIAGNOSIS — IMO0002 Reserved for concepts with insufficient information to code with codable children: Secondary | ICD-10-CM

## 2018-12-19 DIAGNOSIS — G8929 Other chronic pain: Secondary | ICD-10-CM

## 2018-12-19 DIAGNOSIS — E1142 Type 2 diabetes mellitus with diabetic polyneuropathy: Secondary | ICD-10-CM | POA: Diagnosis not present

## 2018-12-19 DIAGNOSIS — H518 Other specified disorders of binocular movement: Secondary | ICD-10-CM | POA: Diagnosis not present

## 2018-12-19 DIAGNOSIS — I152 Hypertension secondary to endocrine disorders: Secondary | ICD-10-CM | POA: Insufficient documentation

## 2018-12-19 DIAGNOSIS — I69398 Other sequelae of cerebral infarction: Secondary | ICD-10-CM | POA: Diagnosis not present

## 2018-12-19 DIAGNOSIS — I6523 Occlusion and stenosis of bilateral carotid arteries: Secondary | ICD-10-CM | POA: Diagnosis not present

## 2018-12-19 DIAGNOSIS — J449 Chronic obstructive pulmonary disease, unspecified: Secondary | ICD-10-CM | POA: Diagnosis not present

## 2018-12-19 DIAGNOSIS — E1165 Type 2 diabetes mellitus with hyperglycemia: Secondary | ICD-10-CM

## 2018-12-19 DIAGNOSIS — E1159 Type 2 diabetes mellitus with other circulatory complications: Secondary | ICD-10-CM | POA: Insufficient documentation

## 2018-12-19 DIAGNOSIS — R69 Illness, unspecified: Secondary | ICD-10-CM | POA: Diagnosis not present

## 2018-12-19 NOTE — Progress Notes (Signed)
Subjective:    Patient ID: Kendra [name], female    DOB: Apr 06, 1946, 73 y.o.   MRN: [id number]  Kendra [name] is a 73 y.o. female presenting on 12/19/2018 for Hospitalization Follow-up (pt was recently discharged for a TIA)   [facility] Follow-up  Patient was diagnosed with CVA and treated at [facility].  She had an event much more significant than a TIA, but patient refers to this as a TIA.  Hospital/Location: [facility] Date of Admission: 12/06/2018 Date of Discharge: 12/10/2018 Transitions of care telephone call: 12/11/2018  Reason for Admission: CVA, Ischemic Right MCA Primary (+Secondary) Diagnosis: Right MCA ischemic CVA, left hemiparesis  FOLLOW-UP  - Hospital H&P and Discharge Summary have been reviewed. - Patient presents today 9 days after recent hospitalization.  - New medications on discharge: aspirin 81 mg once daily - Changes to current meds on discharge: Increased lisinopril to 5 mg daily, atorvastatin to 80 mg    Patient started having symptoms of headache on 3/20.  Notes she had a bad headache when she wok up and she fell.  She could not help her son stand as her left side was too weak.  Transported to hospital via EMS. She was treated with thrombolysis and thrombectomy.  No known source of clot, but is presumed to be atherosclerotic disease.  She has established [facility] follow-up in 4-8 weeks.  Visit is not scheduled.  Patient will call today. - Patient had initial NIHSS 20, resolution tow NIHSS of 2 at discharge.  DC summary recommended PCP discuss dm, hypertension, smoking cessation counseling at D/C follow-up. - Patient with residual deficits of Left leg weakness.  Left hand weakness but still has regular functioning.  She is continuing with [facility] for PT/OT.  She has been provided rolling walker, and 3-in-1 commode.  Home health has started.  SINCE discharge: - Patient has had some headaches ongoing since discharge.  - Patient uses walker  at home.  Patient is not walking on uneven surfaces outside.  - Patient is not currently cooking for safety. Her son is helping her with meals now.   Diabetes "Entire bag of meds was $41" the last time she left my clinic (09/30/2018) about 3 months ago.  She had only enough money to get one inhaler.  She didn't get any of those other fills and has been off medications except Lantus since that time.  Patient states they keep sending wrong test strips - she is using accu-chek aviva meter currently which was the last order sent to the pharmacy.  She is unable to tell me who is sending her strips. Patient has used mail order in past and is content to use it again.  [facility] benefit mail order [facility], but has no account set up this year.  Hypertension Last BP readings with physical therapy - 132/78 "possibly"  - Patient is taking some of her old dose of lisinopril, but has not taken her cholesterol medication nor has she gotten increased dose from hospital filled.  Social History   Tobacco Use  . Smoking status: Current Some Day Smoker    Packs/day: 0.25    Types: Cigarettes  . Smokeless tobacco: Never Used  Substance Use Topics  . Alcohol use: No  . Drug use: No   Outpatient Encounter Medications as of 12/19/2018  Medication Sig  . [DISCONTINUED] ACCU-CHEK SOFTCLIX LANCETS lancets Use One time daily as instructed  . [DISCONTINUED] acetaminophen (TYLENOL) 500  MG tablet Take 1 tablet (500 mg total) by mouth every 8 (eight) hours as needed. (Patient taking differently: Take 1,000 mg by mouth every 8 (eight) hours as needed. )  . [DISCONTINUED] aspirin 81 MG EC tablet Take 1 tablet (81 mg total) by mouth daily. Swallow whole.  . [DISCONTINUED] atorvastatin (LIPITOR) 40 MG tablet TAKE 1 TABLET EVERY DAY (IN PLACE OF PRAVASTATIN)  . [DISCONTINUED] Blood Glucose Monitoring Suppl (ACCU-CHEK AVIVA PLUS) w/Device KIT CHECK BLOOD SUGAR ONE TIME DAILY  . [DISCONTINUED]  Cholecalciferol (VITAMIN D3 PO) Take 125 mcg by mouth daily.   . [DISCONTINUED] citalopram (CELEXA) 40 MG tablet Take 1 tablet (40 mg total) by mouth daily. (Patient not taking: Reported on 12/20/2018)  . [DISCONTINUED] glucose blood (ACCU-CHEK AVIVA PLUS) test strip CHECK BLOOD SUGAR ONE TIME DAILY  . [DISCONTINUED] Insulin Glargine (LANTUS SOLOSTAR) 100 UNIT/ML Solostar Pen INJECT  24 UNITS SUBCUTANEOUSLY EVERY DAY  AT  10PM  . [DISCONTINUED] Insulin Pen Needle (BD PEN NEEDLE NANO U/F) 32G X 4 MM MISC USE WITH LANTUS AS DIRECTED  . [DISCONTINUED] Lancet Devices (LANCET DEVICE WITH EJECTOR) MISC 1 Device by Does not apply route daily. Use to check blood sugar once daily.  . [DISCONTINUED] Lancets Misc. (ACCU-CHEK SOFTCLIX LANCET DEV) KIT   . [DISCONTINUED] lisinopril (PRINIVIL,ZESTRIL) 2.5 MG tablet Take 1 tablet (2.5 mg total) by mouth daily.  . [DISCONTINUED] albuterol (VENTOLIN HFA) 108 (90 Base) MCG/ACT inhaler INHALE 1 TO 2 PUFFS INTO THE LUNGS EVERY 6 HOURS AS NEEDED FOR WHEEZING OR SHORTNESS OF BREATH (Patient not taking: Reported on 12/19/2018)  . [DISCONTINUED] baclofen (LIORESAL) 10 MG tablet Take 1 tablet (10 mg total) by mouth daily as needed for muscle spasms.  . [DISCONTINUED] Fluticasone-Umeclidin-Vilant (TRELEGY ELLIPTA) 100-62.5-25 MCG/INH AEPB Inhale 1 puff into the lungs daily. (Patient not taking: Reported on 12/19/2018)  . [DISCONTINUED] glipiZIDE (GLUCOTROL) 5 MG tablet Take 1 tablet (5 mg total) by mouth daily before breakfast.  . [DISCONTINUED] meloxicam (MOBIC) 15 MG tablet TAKE 1 TABLET EVERY DAY (Patient taking differently: Take 15 mg by mouth daily as needed (for arthritis pain). )  . [DISCONTINUED] metFORMIN (GLUCOPHAGE-XR) 500 MG 24 hr tablet Take 1 tablet (500 mg total) by mouth daily with breakfast. (Patient not taking: Reported on 12/19/2018)   No facility-administered encounter medications on file as of 12/19/2018.    Medications reviewed with patient in clinic  Review of  Systems Per HPI unless specifically indicated above     Objective:    There were no vitals taken for this visit.  Wt Readings from Last 3 Encounters:  09/30/18 153 lb 9.6 oz (69.7 kg)  07/31/18 163 lb (73.9 kg)  05/07/18 163 lb 6.4 oz (74.1 kg)    Physical Exam Patient remotely monitored.  Verbal communication appropriate.  Cognition normal.   Results for orders placed or performed in visit on 09/30/18  Lipid panel  Result Value Ref Range   Cholesterol 122 <200 mg/dL   HDL 39 (L) >50 mg/dL   Triglycerides 142 <150 mg/dL   LDL Cholesterol (Calc) 60 mg/dL (calc)   Total CHOL/HDL Ratio 3.1 <5.0 (calc)   Non-HDL Cholesterol (Calc) 83 <130 mg/dL (calc)  COMPLETE METABOLIC PANEL WITH GFR  Result Value Ref Range   Glucose, Bld 100 (H) 65 - 99 mg/dL   BUN 18 7 - 25 mg/dL   Creat 0.80 0.60 - 0.93 mg/dL   GFR, Est Non African American 75 > OR = 60 mL/min/1.62m   GFR, Est African American  87 > OR = 60 mL/min/1.3m   BUN/Creatinine Ratio NOT APPLICABLE 6 - 22 (calc)   Sodium 140 135 - 146 mmol/L   Potassium 4.3 3.5 - 5.3 mmol/L   Chloride 105 98 - 110 mmol/L   CO2 29 20 - 32 mmol/L   Calcium 9.9 8.6 - 10.4 mg/dL   Total Protein 6.9 6.1 - 8.1 g/dL   Albumin 4.4 3.6 - 5.1 g/dL   Globulin 2.5 1.9 - 3.7 g/dL (calc)   AG Ratio 1.8 1.0 - 2.5 (calc)   Total Bilirubin 0.6 0.2 - 1.2 mg/dL   Alkaline phosphatase (APISO) 57 33 - 130 U/L   AST 14 10 - 35 U/L   ALT 15 6 - 29 U/L  POCT glycosylated hemoglobin (Hb A1C)  Result Value Ref Range   Hemoglobin A1C 7.8 (A) 4.0 - 5.6 %   HbA1c POC (<> result, manual entry)     HbA1c, POC (prediabetic range)     HbA1c, POC (controlled diabetic range)        Assessment & Plan:   Problem List Items Addressed This Visit      Cardiovascular and Mediastinum   Essential hypertension     Endocrine   Diabetes mellitus type 2, controlled, with complications ([place]     Other   Dyslipidemia   Chronic midline low back pain without sciatica     Other Visit Diagnoses    Hospital discharge follow-up    -  Primary   Hemiparesis affecting left side as late effect of stroke ([place]       Relevant Orders   Ambulatory referral to Chronic Care Management Services   History of ischemic right MCA stroke       Relevant Orders   Ambulatory referral to Chronic Care Management Services     # Hospital FOLLOW-UP: History of RIGHT MCA CVA with hemiparesis affecting left leg > left arm Patient with expected recovery at this time with left leg weakness persisting.  [facility]  Plan: 1. Continue managing dyslipidemia, hypertension to goals 2. Continue homehealth recovery 3. Medication adherence problems discovered today.  Needs referral to CCM.    # Dyslipidemia Currently uncontrolled and not on statin medication despite being prescribed.    Plan: 1. Discussed need to have regular high-intensity statin to prevent future ASCVD events. - Continue atorvastatin as prescribed at discharge 2. Labs in 3 months after resuming med.   #DMT2, Hypertension Uncontrolled, off medications due to cost/coverage issues for patient.   - Referral to CCM - Medications refilled.  Patient needs to resume ASAP to improve control - labs 3 months to reassess.   Disclosed to patient at start of encounter that we will bill telephone services and she will receive bill of services provided.  Patient consents to be treated via phone prior to discussion.  - Patient is at her home and is accessed via telephone. - [facility] - Time spent in direct consultation with patient on phone: 17 minutes  Follow up plan: Follow-up 3 months.  LCassell Smiles DNP, AGPCNP-BC Adult Gerontology Primary Care Nurse Practitioner [facility] 12/19/2018, 9:20 AM

## 2018-12-19 NOTE — Chronic Care Management (AMB) (Signed)
Chronic Care Management   Note  12/19/2018 Name: Kendra [name] MRN: [id number] DOB: 12-11-1945   Subjective:   Kendra [name] is a 73 y.o. year old female who is a primary care patient of Kendra College, NP. The CM team was consulted for assistance with chronic disease management, care coordination and medication assistance.  I reached out to [name] by phone today.   Kendra [name] was given information about Chronic Care Management services today. Patient agreed to services and verbal consent obtained. Investigation of health plan enrollment underway.  Kendra [name] stated that her current coverage is through [employer]. Also per notes in chart from [place], patient had Full Extra Help Subsidy in 2019. However, patient states that when she called her [place] to have her medications refilled last week, she was told that her cost would be over $200.  Place a coordination of care call to patient's Kendra [name]. Kendra [name] Pharmacist attempts to bill Kendra [name]' prescriptions through her [employer] that is listed and reports back a rejection of patient not-covered and requests an alternative card for the patient.  Attempt to reach Kendra [name] again by telephone, but patient does not answer.   Objective: Lab Results  Component Value Date   CREATININE 0.80 09/30/2018   CREATININE 0.92 05/07/2018   CREATININE 0.94 01/17/2018    Lab Results  Component Value Date   HGBA1C 7.8 (A) 09/30/2018    Lipid Panel     Component Value Date/Time   CHOL 122 09/30/2018 1151   CHOL 177 06/07/2015 1215   CHOL 141 09/02/2014 0440   TRIG 142 09/30/2018 1151   TRIG 155 09/02/2014 0440   HDL 39 (L) 09/30/2018 1151   HDL 56 06/07/2015 1215   HDL 37 (L) 09/02/2014 0440   CHOLHDL 3.1 09/30/2018 1151   VLDL 16 07/26/2016 1216   VLDL 31 09/02/2014 0440   LDLCALC 60 09/30/2018 1151   LDLCALC 73 09/02/2014 0440    BP Readings from Last 3 Encounters:  09/30/18 (!) 108/57  07/31/18 110/69  05/07/18 (!) 113/58    No Known Allergies  Medications Reviewed Today    Reviewed by Wilson Singer, CMA (Certified Medical Assistant) on 12/19/18 at [phone]  Med List Status: <None>  Medication Order Taking? Sig Documenting Provider Last Dose Status Informant  ACCU-CHEK SOFTCLIX LANCETS lancets [id number] Yes Use One time daily as instructed Kendra College, NP Taking Active   acetaminophen (TYLENOL) 500 MG tablet [id number] Yes Take 1 tablet (500 mg total) by mouth every 8 (eight) hours as needed. Steele Sizer, MD Taking Active   albuterol (VENTOLIN HFA) 108 (90 Base) MCG/ACT inhaler [id number] No INHALE 1 TO 2 PUFFS INTO THE LUNGS EVERY 6 HOURS AS NEEDED FOR WHEEZING OR SHORTNESS OF BREATH  Patient not taking:  Reported on 12/19/2018   Olin Hauser, DO Not Taking Active   aspirin 81 MG EC tablet [id number] Yes Take 1 tablet (81 mg total) by mouth daily. Swallow whole. Kendra College, NP Taking Active   atorvastatin (LIPITOR) 40 MG tablet [id number] Yes TAKE 1 TABLET EVERY DAY (IN PLACE OF PRAVASTATIN) Kendra College, NP Taking Active   baclofen (LIORESAL) 10 MG tablet [id number] No Take 1 tablet (10 mg total) by mouth daily as needed for muscle spasms.  Patient not taking:  Reported on 12/19/2018   Kendra College, NP Not Taking Active   Blood Glucose Monitoring Suppl (ACCU-CHEK  AVIVA PLUS) w/Device KIT [id number] Yes CHECK BLOOD SUGAR ONE TIME DAILY Kendra College, NP Taking Active   Cholecalciferol (VITAMIN D3 PO) [id number] Yes Take by mouth. [provider] Taking Active   citalopram (CELEXA) 40 MG tablet [id number] Yes Take 1 tablet (40 mg total) by mouth daily. Kendra College, NP Taking Active   Fluticasone-Umeclidin-Vilant (TRELEGY ELLIPTA) 100-62.5-25 MCG/INH AEPB [id number] No Inhale 1 puff into the lungs daily.  Patient not  taking:  Reported on 12/19/2018   Olin Hauser, DO Not Taking Active   glipiZIDE (GLUCOTROL) 5 MG tablet [id number] No Take 1 tablet (5 mg total) by mouth daily before breakfast.  Patient not taking:  Reported on 12/19/2018   Kendra College, NP Not Taking Active   glucose blood (ACCU-CHEK AVIVA PLUS) test strip [id number] Yes CHECK BLOOD SUGAR ONE TIME DAILY Kendra College, NP Taking Active   Insulin Glargine (LANTUS SOLOSTAR) 100 UNIT/ML Solostar Pen [id number] Yes INJECT  24 UNITS SUBCUTANEOUSLY EVERY DAY  AT  10PM Kendra College, NP Taking Active   Insulin Pen Needle (BD PEN NEEDLE NANO U/F) 32G X 4 MM MISC [id number] Yes USE WITH LANTUS AS DIRECTED Kendra College, NP Taking Active   Lancet Devices (LANCET DEVICE WITH EJECTOR) MISC [id number] Yes 1 Device by Does not apply route daily. Use to check blood sugar once daily. Kendra College, NP Taking Active   Lancets Misc. (ACCU-CHEK SOFTCLIX LANCET DEV) KIT [id number] Yes  [provider] Taking Active   lisinopril (PRINIVIL,ZESTRIL) 2.5 MG tablet [id number] Yes Take 1 tablet (2.5 mg total) by mouth daily. Olin Hauser, DO Taking Active   meloxicam (MOBIC) 15 MG tablet [id number] No TAKE 1 TABLET EVERY DAY  Patient not taking:  Reported on 12/19/2018   Kendra College, NP Not Taking Active   metFORMIN (GLUCOPHAGE-XR) 500 MG 24 hr tablet [id number] No Take 1 tablet (500 mg total) by mouth daily with breakfast.  Patient not taking:  Reported on 12/19/2018   Kendra College, NP Not Taking Active            Assessment:   Patient in need of assistance with determining her current health plan coverage and assistance with medication cost.  Plan:  The CM team will reach out to the patient again tomorrow.  [name], PharmD, [place] [employer] [phone]

## 2018-12-20 ENCOUNTER — Other Ambulatory Visit: Payer: Self-pay | Admitting: Nurse Practitioner

## 2018-12-20 ENCOUNTER — Ambulatory Visit (INDEPENDENT_AMBULATORY_CARE_PROVIDER_SITE_OTHER): Payer: Medicare HMO | Admitting: Pharmacist

## 2018-12-20 ENCOUNTER — Ambulatory Visit: Payer: Self-pay | Admitting: *Deleted

## 2018-12-20 DIAGNOSIS — E1165 Type 2 diabetes mellitus with hyperglycemia: Secondary | ICD-10-CM

## 2018-12-20 DIAGNOSIS — Z794 Long term (current) use of insulin: Secondary | ICD-10-CM

## 2018-12-20 DIAGNOSIS — E118 Type 2 diabetes mellitus with unspecified complications: Secondary | ICD-10-CM

## 2018-12-20 DIAGNOSIS — E785 Hyperlipidemia, unspecified: Secondary | ICD-10-CM

## 2018-12-20 DIAGNOSIS — M19011 Primary osteoarthritis, right shoulder: Secondary | ICD-10-CM

## 2018-12-20 DIAGNOSIS — M158 Other polyosteoarthritis: Secondary | ICD-10-CM

## 2018-12-20 DIAGNOSIS — IMO0002 Reserved for concepts with insufficient information to code with codable children: Secondary | ICD-10-CM

## 2018-12-20 DIAGNOSIS — F331 Major depressive disorder, recurrent, moderate: Secondary | ICD-10-CM

## 2018-12-20 DIAGNOSIS — E559 Vitamin D deficiency, unspecified: Secondary | ICD-10-CM

## 2018-12-20 DIAGNOSIS — M545 Low back pain, unspecified: Secondary | ICD-10-CM

## 2018-12-20 DIAGNOSIS — R69 Illness, unspecified: Secondary | ICD-10-CM | POA: Diagnosis not present

## 2018-12-20 DIAGNOSIS — I10 Essential (primary) hypertension: Secondary | ICD-10-CM | POA: Diagnosis not present

## 2018-12-20 DIAGNOSIS — E1129 Type 2 diabetes mellitus with other diabetic kidney complication: Secondary | ICD-10-CM

## 2018-12-20 DIAGNOSIS — J449 Chronic obstructive pulmonary disease, unspecified: Secondary | ICD-10-CM

## 2018-12-20 DIAGNOSIS — M25511 Pain in right shoulder: Secondary | ICD-10-CM

## 2018-12-20 DIAGNOSIS — R809 Proteinuria, unspecified: Secondary | ICD-10-CM

## 2018-12-20 DIAGNOSIS — E1142 Type 2 diabetes mellitus with diabetic polyneuropathy: Secondary | ICD-10-CM

## 2018-12-20 DIAGNOSIS — G8929 Other chronic pain: Secondary | ICD-10-CM

## 2018-12-20 MED ORDER — INSULIN PEN NEEDLE 32G X 4 MM MISC: 3 refills | Status: DC

## 2018-12-20 MED ORDER — FLUTICASONE-UMECLIDIN-VILANT 100-62.5-25 MCG/INH IN AEPB: 1.0000 | INHALATION_SPRAY | Freq: Every day | RESPIRATORY_TRACT | 3 refills | Status: DC

## 2018-12-20 MED ORDER — ALBUTEROL SULFATE HFA 108 (90 BASE) MCG/ACT IN AERS: INHALATION_SPRAY | RESPIRATORY_TRACT | 2 refills | Status: DC

## 2018-12-20 MED ORDER — VITAMIN D3 50 MCG (2000 UT) PO CAPS: 2000.0000 [IU] | ORAL_CAPSULE | Freq: Every day | ORAL | 1 refills | Status: DC

## 2018-12-20 MED ORDER — BACLOFEN 10 MG PO TABS: 10.0000 mg | ORAL_TABLET | Freq: Every day | ORAL | 1 refills | Status: DC | PRN

## 2018-12-20 MED ORDER — ATORVASTATIN CALCIUM 40 MG PO TABS: 40.0000 mg | ORAL_TABLET | Freq: Every day | ORAL | 3 refills | Status: DC

## 2018-12-20 MED ORDER — MELOXICAM 15 MG PO TABS: 15.0000 mg | ORAL_TABLET | Freq: Every day | ORAL | 1 refills | Status: DC | PRN

## 2018-12-20 MED ORDER — ASPIRIN 81 MG PO TBEC: 81.0000 mg | DELAYED_RELEASE_TABLET | Freq: Every day | ORAL | 3 refills | Status: DC

## 2018-12-20 MED ORDER — CITALOPRAM HYDROBROMIDE 40 MG PO TABS: 40.0000 mg | ORAL_TABLET | Freq: Every day | ORAL | 1 refills | Status: DC

## 2018-12-20 MED ORDER — LISINOPRIL 2.5 MG PO TABS: 2.5000 mg | ORAL_TABLET | Freq: Every day | ORAL | 3 refills | Status: DC

## 2018-12-20 MED ORDER — ACETAMINOPHEN 500 MG PO TABS: 1000.0000 mg | ORAL_TABLET | Freq: Three times a day (TID) | ORAL | 1 refills | Status: DC | PRN

## 2018-12-20 MED ORDER — GLIPIZIDE 5 MG PO TABS: 5.0000 mg | ORAL_TABLET | Freq: Every day | ORAL | 1 refills | Status: DC

## 2018-12-20 MED ORDER — ONETOUCH DELICA LANCETS 33G MISC: 1.0000 | Freq: Two times a day (BID) | 4 refills | Status: DC

## 2018-12-20 MED ORDER — INSULIN GLARGINE 100 UNIT/ML SOLOSTAR PEN: PEN_INJECTOR | SUBCUTANEOUS | 3 refills | Status: DC

## 2018-12-20 MED ORDER — ONETOUCH DELICA LANCING DEV MISC: 1.0000 | Freq: Two times a day (BID) | 0 refills | Status: DC

## 2018-12-20 MED ORDER — ONETOUCH VERIO W/DEVICE KIT: 1.0000 | PACK | Freq: Two times a day (BID) | 0 refills | Status: DC

## 2018-12-20 MED ORDER — METFORMIN HCL ER 500 MG PO TB24: 500.0000 mg | ORAL_TABLET | Freq: Every day | ORAL | 1 refills | Status: DC

## 2018-12-20 MED ORDER — GLUCOSE BLOOD VI STRP: ORAL_STRIP | 4 refills | Status: DC

## 2018-12-20 NOTE — Chronic Care Management (AMB) (Addendum)
  Chronic Care Management   Follow Up Note   12/20/2018 Name: Kendra [name] MRN: [id number] DOB: 1946/08/25  Referred by: [name], NP Reason for referral : Chronic Care Management (Initial Outreach New Referral DM)   Kendra [name] is a 73 y.o. year old female who is a primary care patient of [name], NP. The CCM team was consulted for assistance with chronic disease management and care coordination needs.    Review of patient status, including review of consultants reports, relevant laboratory and other test results, and collaboration with appropriate care team members and the patient's provider was performed as part of comprehensive patient evaluation and provision of chronic care management services.    Return call received from Kendra [name].   Goals Addressed    . "I need some help with my sugar" (pt-stated)       Current Barriers:  Kendra [name] Knowledge Deficits related to diabetes management  Nurse Case Manager Clinical Goal(s):  Kendra [name] Over the next 30 days, patient will verbalize understanding of plan for diabetes management . Over the next 30 days, patient will demonstrate improved health management independence as evidenced byperforming self health management activities such as checking cbg's/recording, working with CM team to address needs around chronic disease management  Interventions:  . Evaluation of current treatment plan related to diabetes management and patient's adherence to plan as established by provider. . Provided education to patient re: assistance being provided with medication management and importance of med adherence, self health management activities (see patient education provided) . Reviewed medications with patient and discussed collaboration with CM Clinic pharmacist . Collaborated with Pharmacist  regarding medication management needs  Patient Self Care Activities:  . Currently UNABLE TO independently self manage diabetes .  Performs ADL's independently . Calls provider office for new concerns or questions  Initial goal documentation    . "I want to make sure I don't have that virus" (pt-stated)       Current Barriers:  Kendra [name] Knowledge Deficits related to chronic pulmonary disease (COPD) and current COVID19 pandemic  Nurse Case Manager Clinical Goal(s):  Kendra [name] Over the next 30 days, patient will verbalize understanding of plan for long term self health management related to chronic pulmonary disease and infectious disease prevention  Interventions:  . Evaluation of current treatment plan related to COPD management and patient's adherence to plan as established by provider. . Advised patient to maintain social distance and try not to leave her home unless necessary for provider appointments.  Provided education to patient re: COVID19 signs and symptoms vs usual symptoms (per her reported baseline) of moderate COPD.  Provided web information as requested:   For information about COVID-19 or "Corona Virus", the following web resources may be helpful: CDC: BeginnerSteps.be   [place]:  [email or web address]  Patient Self Care Activities:  . Performs ADL's independently . Calls provider office for new concerns or questions  Initial goal documentation      The CM team will reach out to the patient again over the next 7 days.    [facility] Care Management [phone]

## 2018-12-20 NOTE — Chronic Care Management (AMB) (Signed)
Chronic Care Management   Note  12/20/2018 Name: Kendra [name] MRN: [id number] DOB: May 30, 1946   Subjective:   Kendra [name] is a 73 y.o. year old female who is a primary care patient of Kendra College, NP. The CM team was consulted for assistance with chronic disease management and care coordination.   I reached out to [name] by phone today. Speak with patient and her son Kendra [name]) regarding medication management, medication adherence and medication assistance.  [name] was given information about Chronic Care Management services today including:  1. CCM service includes personalized support from designated clinical staff supervised by her physician, including individualized plan of care and coordination with other care providers 2. 24/7 contact phone numbers for assistance for urgent and routine care needs. 3. Service will only be billed when office clinical staff spend 20 minutes or more in a month to coordinate care. 4. Only one practitioner may furnish and bill the service in a calendar month. 5. The patient may stop CCM services at any time (effective at the end of the month) by phone call to the office staff. 6. The patient will be responsible for cost sharing (co-pay) of up to 20% of the service fee (after annual deductible is met).  Patient has agreed to services and verbal consent obtained.   Review of patient status, including review of consultants reports, laboratory and other test data, was performed as part of comprehensive evaluation and provision of chronic care management services.   Objective: Lab Results  Component Value Date   CREATININE 0.80 09/30/2018   CREATININE 0.92 05/07/2018   CREATININE 0.94 01/17/2018    Lab Results  Component Value Date   HGBA1C 7.8 (A) 09/30/2018    Lipid Panel     Component Value Date/Time   CHOL 122 09/30/2018 1151   CHOL 177 06/07/2015 1215   CHOL 141 09/02/2014 0440   TRIG 142 09/30/2018 1151    TRIG 155 09/02/2014 0440   HDL 39 (L) 09/30/2018 1151   HDL 56 06/07/2015 1215   HDL 37 (L) 09/02/2014 0440   CHOLHDL 3.1 09/30/2018 1151   VLDL 16 07/26/2016 1216   VLDL 31 09/02/2014 0440   LDLCALC 60 09/30/2018 1151   LDLCALC 73 09/02/2014 0440    BP Readings from Last 3 Encounters:  09/30/18 (!) 108/57  07/31/18 110/69  05/07/18 (!) 113/58    No Known Allergies  Medications Reviewed Today    Reviewed by Kendra [name], RPH (Pharmacist) on 12/20/18 at 1420  Med List Status: <None>  Medication Order Taking? Sig Documenting Provider Last Dose Status Informant  ACCU-CHEK SOFTCLIX LANCETS lancets [id number]  Use One time daily as instructed Kendra College, NP  Active   acetaminophen (TYLENOL) 500 MG tablet [id number] Yes Take 1 tablet (500 mg total) by mouth every 8 (eight) hours as needed.  Patient taking differently:  Take 1,000 mg by mouth every 8 (eight) hours as needed.    Steele Sizer, MD Taking Active   albuterol (VENTOLIN HFA) 108 (90 Base) MCG/ACT inhaler [id number] No INHALE 1 TO 2 PUFFS INTO THE LUNGS EVERY 6 HOURS AS NEEDED FOR WHEEZING OR SHORTNESS OF BREATH  Patient not taking:  Reported on 12/19/2018   Olin Hauser, DO Not Taking Active   aspirin 81 MG EC tablet [id number] Yes Take 1 tablet (81 mg total) by mouth daily. Swallow whole. Kendra College, NP Taking Active   atorvastatin (LIPITOR) 40 MG tablet [id number] Yes TAKE 1  TABLET EVERY DAY (IN PLACE OF PRAVASTATIN) Kendra College, NP Taking Active   baclofen (LIORESAL) 10 MG tablet [id number] Yes Take 1 tablet (10 mg total) by mouth daily as needed for muscle spasms. Kendra College, NP Taking Active   Blood Glucose Monitoring Suppl (ACCU-CHEK AVIVA PLUS) w/Device KIT [id number]  CHECK BLOOD SUGAR ONE TIME DAILY Kendra College, NP  Active   Cholecalciferol (VITAMIN D3 PO) [id number] Yes Take 125 mcg by mouth daily.  [provider] Taking Active   citalopram  (CELEXA) 40 MG tablet [id number]  Take 1 tablet (40 mg total) by mouth daily. Kendra College, NP  Active   Fluticasone-Umeclidin-Vilant (TRELEGY ELLIPTA) 100-62.5-25 MCG/INH AEPB [id number] No Inhale 1 puff into the lungs daily.  Patient not taking:  Reported on 12/19/2018   Olin Hauser, DO Not Taking Active   glipiZIDE (GLUCOTROL) 5 MG tablet [id number] Yes Take 1 tablet (5 mg total) by mouth daily before breakfast. Kendra College, NP Taking Active   glucose blood (ACCU-CHEK AVIVA PLUS) test strip [id number]  CHECK BLOOD SUGAR ONE TIME DAILY Kendra College, NP  Active   Insulin Glargine (LANTUS SOLOSTAR) 100 UNIT/ML Solostar Pen [id number] Yes INJECT  24 UNITS SUBCUTANEOUSLY EVERY DAY  AT  10PM Kendra College, NP Taking Active   Insulin Pen Needle (BD PEN NEEDLE NANO U/F) 32G X 4 MM MISC [id number]  USE WITH LANTUS AS DIRECTED Kendra College, NP  Active   Lancet Devices (LANCET DEVICE WITH EJECTOR) MISC [id number]  1 Device by Does not apply route daily. Use to check blood sugar once daily. Kendra College, NP  Active   Lancets Misc. (ACCU-CHEK SOFTCLIX LANCET DEV) KIT [id number]   [provider]  Active   lisinopril (PRINIVIL,ZESTRIL) 2.5 MG tablet [id number] Yes Take 1 tablet (2.5 mg total) by mouth daily. Olin Hauser, DO Taking Active   meloxicam (MOBIC) 15 MG tablet [id number] Yes TAKE 1 TABLET EVERY DAY  Patient taking differently:  Take 15 mg by mouth daily as needed (for arthritis pain).    Kendra College, NP Taking Active   metFORMIN (GLUCOPHAGE-XR) 500 MG 24 hr tablet [id number] No Take 1 tablet (500 mg total) by mouth daily with breakfast.  Patient not taking:  Reported on 12/19/2018   Kendra College, NP Not Taking Active            Assessment:   Goals Addressed            This Visit's Progress   . "I need help affording my medicine"  (pt-stated)       Current Barriers:  [name] Knowledge Deficits  related to medication assistance and health plan coverage for medications . Financial Barriers . Medication Adherence - reports missing doses due to financial barriers, but also reports no longer using weekly pillbox to manage medications. . Lack of glucometer results for clinical team  Pharmacist Clinical Goal(s):  [name] Over the next 30 days, patient will demonstrate Improved medication adherence as evidenced by verbalized understanding of prescribed medication regimen, assistance available, and patient report of adherence . Over the next 30 days, patient will work with Consulting civil engineer to address needs related to diabetes disease management and education  . Over the next 30 days, patient will work with LCSW to address needs related to financial constraints  Over the next 14 days, patient will demonstrate improved adherence to blood sugar checks as evidenced by telephonic blood sugar review  Interventions:  Collaboration with practice insurance administration department to determine patient's current active health care benefits. Determine patient has current active coverage through [facility].  Coordination of care call to [place] Worker - Burnis Medin [phone])  Counsel patient and son, Kendra [name], about [place] benefits.  Perform Medication Review with patient.  Note patient currently out of her albuterol inhaler, Trelegy inhaler, citalopram and metformin  Reports that she does not currently have the correct test strips of her Accu-Chek meter. Note that per [name] Summary of benefits, patient's current plan exclusively covers blood glucose monitors and diabetic test strips manufactured by [employer].  Patient counseled about taking meloxicam only as needed and always taking with food. . [facility] - [facility] with patient's current insurance information and verify that all patient's current prescription  medications are covered through her plan. o Note that patient does have Extra Help Subsidy - Level 1.  o Patient's son states that he will call the pharmacy and pick up needed medications for the patient. . Discussed plans with patient for ongoing care management follow up and provided patient with direct contact information for care management team . Collaboration with provider re: medication management - Request that PCP send in new prescriptions for all of patient's current active medications & new glucose testing meters and supplies to [facility] . Referred to [facility] for disease management . Counsel patient on the importance of medication adherence o Counsel patient about daily use of Trelegy inhaler and rinsing mouth after each use. o Counsel patient about using weekly pillbox as adherence tool. Patient's son reports that he has a weekly pillbox for the patient and will help her with setting this up.   Instructions provided to patient's son Kendra [name]) for how to access video for how to use:  Trelegy inhaler from manufacturer website: https://www.trelegy.com/how-to-use-trelegy/  One Touch Verio Meter from manufacturer website: MobiPortfolios.at  Patient Self Care Activities:  . Currently UNABLE TO independently manage medications . Calls pharmacy for medication refills . Calls provider office for new concerns or questions  . Patient to check blood sugars as directed with new meter  Please see past updates related to this goal by clicking on the "Past Updates" button in the selected goal          Plan: 1) Patient's son, Kendra [name], states that he will [facility] and pick up needed medications for the patient. 2) With son's assistance, patient to check blood sugars as directed with new meter 3) Telephone follow up appointment with CCM team member scheduled for: 12/23/2018  [name], PharmD, [place] [phone]

## 2018-12-20 NOTE — Patient Instructions (Signed)
Visit Information  Goals    . "I need help affording my medicine"  (pt-stated)     Current Barriers:   Knowledge Deficits related to medication assistance and health plan coverage for medications - Ms. Klang stated that her current coverage is through [employer]. Also per notes in chart from [place], patient had Full Extra Help Subsidy in 2019. However, patient states that when she called her [place] to have her medications refilled last week, she was told that her cost would be over $200. [name] Financial Barriers  Pharmacist Clinical Goal(s):  [name] Over the next 30 days, patient will demonstrate Improved medication adherence as evidenced by verbalized understanding of prescribed medication regimen, assistance available, and patient report of adherence . Over the next 30 days, patient will work with Consulting civil engineer to address needs related to diabetes disease management and education  . Over the next 30 days, patient will work with LCSW to address needs related to financial constraints  Interventions:  Collaboration with [place] a coordination of care call to [employer] on 12/19/18. [facility] Pharmacist attempts to bill Ms. Rather' prescriptions through her [employer] that is listed and reports back a rejection of patient not-covered and requests an alternative card for the patient. . Collaboration with practice insurance administration department re: questions about health plan coverage . Discussed plans with patient for ongoing care management follow up and provided patient with direct contact information for care management team . Collaboration with provider re: medication management . Referred to [facility] for disease management . Referred to Clinic Social Worker for financial constraints  Patient Self Care Activities:  . Currently UNABLE TO independently manage  medications . Calls pharmacy for medication refills . Calls provider office for new concerns or questions  Initial goal documentation         Ms. Soth was given information about Chronic Care Management services today including:  1. CCM service includes personalized support from designated clinical staff supervised by her physician, including individualized plan of care and coordination with other care providers 2. 24/7 contact phone numbers for assistance for urgent and routine care needs. 3. Service will only be billed when office clinical staff spend 20 minutes or more in a month to coordinate care. 4. Only one practitioner may furnish and bill the service in a calendar month. 5. The patient may stop CCM services at any time (effective at the end of the month) by phone call to the office staff. 6. The patient will be responsible for cost sharing (co-pay) of up to 20% of the service fee (after annual deductible is met).  Patient agreed to services and verbal consent obtained.   The patient verbalized understanding of instructions provided today and declined a print copy of patient instruction materials.   The CM team will reach out to the patient again tomorrow.  [name], PharmD, [place] [employer] [phone]

## 2018-12-20 NOTE — Chronic Care Management (AMB) (Signed)
  Chronic Care [name]   Note  12/20/2018 Name: Kendra [name] MRN: [id number] DOB: January 04, 1946  Unable to reach Kendra [name] today in response to a referral sent by her primary care provider Kendra Smiles NP and by Kendra [name]. I left a HIPPA compliant message with Kendra [name] requesting a return call.   Please see care plan entry below re: Kendra [name] engagement with patient on 12/19/18.   Goals Addressed    . "I need help affording my medicine"  (pt-stated)       Current Barriers:   Knowledge Deficits related to medication assistance and health plan coverage for medications - Kendra [name] stated that her current coverage is through Kendra [name]. Also per notes in chart from Kendra [name], patient had Full Extra Help Subsidy in 2019. However, patient states that when she called her [place] to have her medications refilled last week, she was told that her cost would be over $200. [name] Financial Barriers  Pharmacist Clinical Goal(s):  [name] Over the next 30 days, patient will demonstrate Improved medication adherence as evidenced by verbalized understanding of prescribed medication regimen, assistance available, and patient report of adherence . Over the next 30 days, patient will work with Kendra [name] to address needs related to diabetes disease [name] and education  . Over the next 30 days, patient will work with LCSW to address needs related to financial constraints  Interventions:  Collaboration with Kendra [name] a coordination of care call to [employer]ra [name] [employer] on 12/19/18. Kendra [name] Pharmacist attempts to bill Kendra [name]' prescriptions through her [employer] that is listed and reports back a rejection of patient not-covered and requests an alternative card for the patient. . Collaboration with practice insurance administration department  re: questions about health plan coverage . Discussed plans with patient for ongoing care [name] follow up and provided patient with direct contact information for care [name] team . Collaboration with provider re: medication [name] . Referred to [name] for disease [name] . Referred to Kendra [name] for financial constraints  Patient Self Care Activities:  . Currently UNABLE TO independently manage medications . Calls pharmacy for medication refills . Calls provider office for new concerns or questions  Initial goal documentation      Follow up plan: The CM team will reach out to the patient again over the next 7 days.   Kendra [name] / Kendra [name] [phone]

## 2018-12-20 NOTE — Patient Instructions (Signed)
Please return a call to [place], BSN, RN, CCM @ [phone] at your earliest convenience.   General Information for all patients:   For information about COVID-19 or "Corona Virus", the following web resources may be helpful:  CDC: BeginnerSteps.be    [place]:  [email or web address]

## 2018-12-20 NOTE — Patient Instructions (Addendum)
Visit Information  Goals Addressed            This Visit's Progress   . "I need help affording my medicine"  (pt-stated)       Current Barriers:  [name] Knowledge Deficits related to medication assistance and health plan coverage for medications . Financial Barriers . Medication Adherence - reports missing doses due to financial barriers, but also reports no longer using weekly pillbox to manage medications. . Lack of glucometer results for clinical team  Pharmacist Clinical Goal(s):  [name] Over the next 30 days, patient will demonstrate Improved medication adherence as evidenced by verbalized understanding of prescribed medication regimen, assistance available, and patient report of adherence . Over the next 30 days, patient will work with Consulting civil engineer to address needs related to diabetes disease management and education  . Over the next 30 days, patient will work with LCSW to address needs related to financial constraints  Over the next 14 days, patient will demonstrate improved adherence to blood sugar checks as evidenced by telephonic blood sugar review  Interventions:  Collaboration with practice insurance administration department to determine patient's current active health care benefits. Determine patient has current active coverage through [facility].  Coordination of care call to [place] Worker - Burnis Medin [phone])  Counsel patient and son, [name], about [place] benefits.  Perform Medication Review with patient.  Note patient currently out of her albuterol inhaler, Trelegy inhaler, citalopram and metformin  Reports that she does not currently have the correct test strips of her Accu-Chek meter. Note that per [facility] Summary of benefits, patient's current plan exclusively covers blood glucose monitors and diabetic test strips manufactured by [employer].  Patient counseled about taking meloxicam only as needed and always  taking with food. . [facility] - [facility] with [facility]'s current insurance information and verify that all patient's current prescription medications are covered through her plan. o Note that patient does have Extra Help Subsidy - Level 1.  o Patient's son states that he will call the pharmacy and pick up needed medications for the patient. . Discussed plans with patient for ongoing care management follow up and provided patient with direct contact information for care management team . Collaboration with provider re: medication management - Request that PCP send in new prescriptions for all of patient's current active medications & new glucose testing meters and supplies [facility] . Referred to [facility] for disease management . Counsel patient on the importance of medication adherence o Counsel patient about daily use of Trelegy inhaler and rinsing mouth after each use. o Counsel patient about using weekly pillbox as adherence tool. Patient's son reports that he has a weekly pillbox for the patient and will help her with setting this up.   Instructions provided to patient's son [name]) for how to access video for how to use:  Trelegy inhaler from manufacturer website: https://www.trelegy.com/how-to-use-trelegy/  One Touch Verio Meter from manufacturer website: MobiPortfolios.at  Patient Self Care Activities:  . Currently UNABLE TO independently manage medications . Calls pharmacy for medication refills . Calls provider office for new concerns or questions  . Patient to check blood sugars as directed with new meter  Please see past updates related to this goal by clicking on the "Past Updates" button in the selected goal          Ms. Grandpre was given information about Chronic Care Management services today including:  1. CCM  service includes personalized support from designated clinical staff supervised  by her physician, including individualized plan of care and coordination with other care providers 2. 24/7 contact phone numbers for assistance for urgent and routine care needs. 3. Service will only be billed when office clinical staff spend 20 minutes or more in a month to coordinate care. 4. Only one practitioner may furnish and bill the service in a calendar month. 5. The patient may stop CCM services at any time (effective at the end of the month) by phone call to the office staff. 6. The patient will be responsible for cost sharing (co-pay) of up to 20% of the service fee (after annual deductible is met).  Patient agreed to services and verbal consent obtained.    The patient verbalized understanding of instructions provided today and declined a print copy of patient instruction materials.   Telephone follow up appointment with CCM team member scheduled for: 12/23/2018  [name], PharmD, [facility] [phone]

## 2018-12-23 ENCOUNTER — Ambulatory Visit: Payer: Self-pay | Admitting: Pharmacist

## 2018-12-23 ENCOUNTER — Encounter: Payer: Self-pay | Admitting: Nurse Practitioner

## 2018-12-23 DIAGNOSIS — Z794 Long term (current) use of insulin: Secondary | ICD-10-CM | POA: Diagnosis not present

## 2018-12-23 DIAGNOSIS — I69354 Hemiplegia and hemiparesis following cerebral infarction affecting left non-dominant side: Secondary | ICD-10-CM | POA: Diagnosis not present

## 2018-12-23 DIAGNOSIS — I69398 Other sequelae of cerebral infarction: Secondary | ICD-10-CM | POA: Diagnosis not present

## 2018-12-23 DIAGNOSIS — E1165 Type 2 diabetes mellitus with hyperglycemia: Secondary | ICD-10-CM

## 2018-12-23 DIAGNOSIS — IMO0002 Reserved for concepts with insufficient information to code with codable children: Secondary | ICD-10-CM

## 2018-12-23 DIAGNOSIS — J449 Chronic obstructive pulmonary disease, unspecified: Secondary | ICD-10-CM | POA: Diagnosis not present

## 2018-12-23 DIAGNOSIS — E1142 Type 2 diabetes mellitus with diabetic polyneuropathy: Secondary | ICD-10-CM | POA: Diagnosis not present

## 2018-12-23 DIAGNOSIS — E785 Hyperlipidemia, unspecified: Secondary | ICD-10-CM | POA: Diagnosis not present

## 2018-12-23 DIAGNOSIS — R69 Illness, unspecified: Secondary | ICD-10-CM | POA: Diagnosis not present

## 2018-12-23 DIAGNOSIS — I6523 Occlusion and stenosis of bilateral carotid arteries: Secondary | ICD-10-CM | POA: Diagnosis not present

## 2018-12-23 DIAGNOSIS — H518 Other specified disorders of binocular movement: Secondary | ICD-10-CM | POA: Diagnosis not present

## 2018-12-23 DIAGNOSIS — I10 Essential (primary) hypertension: Secondary | ICD-10-CM

## 2018-12-23 NOTE — Chronic Care Management (AMB) (Signed)
Chronic Care Management   Follow Up Note   12/23/2018 Name: Kendra [name] MRN: [id number] DOB: 1946-09-02  Referred by: [name], NP Reason for referral : Chronic Care Management (Patient Phone Call)   Kendra [name] is a 73 y.o. year old female who is a primary care patient of [name], NP. The CCM team was consulted for assistance with chronic disease management and care coordination needs.    I reached out to [name] by phone today. Speak with patient and her son Kendra [name]) regarding medication management, medication adherence and medication assistance.  Review of patient status, including review of consultants reports, relevant laboratory and other test results, and collaboration with appropriate care team members and the patient's provider was performed as part of comprehensive patient evaluation and provision of chronic care management services.    Goals Addressed            This Visit's Progress   . "I need help affording my medicine"  (pt-stated)       Current Barriers:  [name] Knowledge Deficits related to medication assistance and health plan coverage for medications . Financial Barriers . Medication Adherence - reports missing doses due to financial barriers, but also reports no longer using weekly pillbox to manage medications. . Lack of glucometer results for clinical team  Pharmacist Clinical Goal(s):  [name] Over the next 30 days, patient will demonstrate Improved medication adherence as evidenced by verbalized understanding of prescribed medication regimen, assistance available, and patient report of adherence . Over the next 30 days, patient will work with Consulting civil engineer to address needs related to diabetes disease management and education  . Over the next 30 days, patient will work with LCSW to address needs related to financial constraints  Over the next 14 days, patient will demonstrate improved adherence to blood sugar checks as  evidenced by telephonic blood sugar review  Interventions:  Perform Medication Review with patient.  Note patient reports now having all of her active medications, except Trelegy inhaler and Vitamin D  Counsel patient and son on how to obtain Vitamin D over the counter.  [facility] reports that the Trelegy inhaler needed to be ordered, but is in today o Patient's son states that he will pick up patient's Trelegy inhaler and over the counter Vitamin [facility] the [facility] today. . Counsel patient on the importance of medication adherence o Counsel patient and son about daily use of Trelegy inhaler and rinsing mouth after each use. o Counsel patient about using weekly pillbox as adherence tool. Patient's son reports that he has a weekly pillbox for the patient and will help her with setting this up this afternoon.   Remind patient and son to access video with son for how to use Trelegy inhaler  [email or web address].trelegy.com/how-to-use-trelegy/  Text this link to son's cell phone as well, per his request  Counsel on importance of checking blood sugar regularly as directed.  Patient Self Care Activities:  . Currently UNABLE TO independently manage medications . Calls pharmacy for medication refills . Calls provider office for new concerns or questions   Reports that she has successfully used her new [employer] Verio meter yesterday.  o 12/22/2018- Fasting morning:125 mg/dL   Please see past updates related to this goal by clicking on the "Past Updates" button in the selected goal           PLAN  1) Telephone follow up appointment with CCM  team member scheduled for tomorrow to follow up regarding Trelegy inhaler, Vitamin D and weekly pillbox 2) CCM Nurse Care Manager to follow up with patient within 7 days regarding diabetes education.   [name], PharmD, [place] [employer]  [phone]

## 2018-12-23 NOTE — Patient Instructions (Signed)
Visit Information  Goals Addressed            This Visit's Progress   . "I need help affording my medicine"  (pt-stated)       Current Barriers:  [name] Knowledge Deficits related to medication assistance and health plan coverage for medications . Financial Barriers . Medication Adherence - reports missing doses due to financial barriers, but also reports no longer using weekly pillbox to manage medications. . Lack of glucometer results for clinical team  Pharmacist Clinical Goal(s):  [name] Over the next 30 days, patient will demonstrate Improved medication adherence as evidenced by verbalized understanding of prescribed medication regimen, assistance available, and patient report of adherence . Over the next 30 days, patient will work with Consulting civil engineer to address needs related to diabetes disease management and education  . Over the next 30 days, patient will work with LCSW to address needs related to financial constraints  Over the next 14 days, patient will demonstrate improved adherence to blood sugar checks as evidenced by telephonic blood sugar review  Interventions:  Perform Medication Review with patient.  Note patient reports now having all of her active medications, except Trelegy inhaler and Vitamin D  Counsel patient and son on how to obtain Vitamin D over the counter.  [facility] reports that the Trelegy inhaler needed to be ordered, but is in today o Patient's son states that he will pick up patient's Trelegy inhaler and over the counter Vitamin D for the [facility] today. . Counsel patient on the importance of medication adherence o Counsel patient and son about daily use of Trelegy inhaler and rinsing mouth after each use. o Counsel patient about using weekly pillbox as adherence tool. Patient's son reports that he has a weekly pillbox for the patient and will help her with setting this up this afternoon.   Remind patient and son  to access video with son for how to use Trelegy inhaler  [email or web address].com/how-to-use-trelegy/  Text this link to son's cell phone as well, per his request  Counsel on importance of checking blood sugar regularly as directed.  Patient Self Care Activities:  . Currently UNABLE TO independently manage medications . Calls pharmacy for medication refills . Calls provider office for new concerns or questions   Reports that she has successfully used her new [employer] Verio meter yesterday.  o 12/22/2018- Fasting morning:125 mg/dL   Please see past updates related to this goal by clicking on the "Past Updates" button in the selected goal          The patient verbalized understanding of instructions provided today and declined a print copy of patient instruction materials.   Telephone follow up appointment with CCM team member scheduled for: 12/24/2018  [name], PharmD, [facility] [phone]

## 2018-12-24 ENCOUNTER — Ambulatory Visit: Payer: Self-pay | Admitting: Pharmacist

## 2018-12-24 DIAGNOSIS — E559 Vitamin D deficiency, unspecified: Secondary | ICD-10-CM

## 2018-12-24 DIAGNOSIS — R69 Illness, unspecified: Secondary | ICD-10-CM | POA: Diagnosis not present

## 2018-12-24 DIAGNOSIS — J449 Chronic obstructive pulmonary disease, unspecified: Secondary | ICD-10-CM

## 2018-12-24 DIAGNOSIS — I10 Essential (primary) hypertension: Secondary | ICD-10-CM | POA: Diagnosis not present

## 2018-12-24 DIAGNOSIS — Z794 Long term (current) use of insulin: Secondary | ICD-10-CM

## 2018-12-24 DIAGNOSIS — E1165 Type 2 diabetes mellitus with hyperglycemia: Secondary | ICD-10-CM

## 2018-12-24 DIAGNOSIS — I69354 Hemiplegia and hemiparesis following cerebral infarction affecting left non-dominant side: Secondary | ICD-10-CM | POA: Diagnosis not present

## 2018-12-24 DIAGNOSIS — E1142 Type 2 diabetes mellitus with diabetic polyneuropathy: Secondary | ICD-10-CM | POA: Diagnosis not present

## 2018-12-24 DIAGNOSIS — IMO0002 Reserved for concepts with insufficient information to code with codable children: Secondary | ICD-10-CM

## 2018-12-24 DIAGNOSIS — H518 Other specified disorders of binocular movement: Secondary | ICD-10-CM | POA: Diagnosis not present

## 2018-12-24 DIAGNOSIS — I69398 Other sequelae of cerebral infarction: Secondary | ICD-10-CM | POA: Diagnosis not present

## 2018-12-24 DIAGNOSIS — I6523 Occlusion and stenosis of bilateral carotid arteries: Secondary | ICD-10-CM | POA: Diagnosis not present

## 2018-12-24 NOTE — Patient Instructions (Signed)
Visit Information  Goals Addressed            This Visit's Progress   . "I need help affording my medicine"        Current Barriers:  . Financial Barriers . Medication Adherence - reports missing doses due to financial barriers, but also reports no longer using weekly pillbox to manage medications. . Lack of glucometer and blood pressure results for clinical team Reports not knowing who the name of her neurologist or when her follow up appointment is scheduled with him.  Pharmacist Clinical Goal(s):  [name] Over the next 30 days, patient will demonstrate improved medication adherence as evidenced by verbalized understanding of prescribed medication regimen, assistance available, and patient report of adherence . Over the next 30 days, patient will work with Consulting civil engineer to address needs related to diabetes disease management and education  . Over the next 30 days, patient will work with LCSW to address needs related to financial constraints  Over the next 14 days, patient will demonstrate improved adherence to blood sugar checks as evidenced by telephonic blood sugar review  Interventions:  Follow up with patient regarding medication adherence  Patient reports now having her Trelegy inhaler, but not having yet used the medication  Patient's son reports he was unable to pick up Vitamin D for the patient due to cost  Reports unable to start using weekly pillbox because unable to locate old pillbox  Advise patient and son of availability of Vitamin D and weekly pillbox at other stores in the area for cost savings  Patient states that she will ask her son to pick up both from [place] . Advise patient to review the Trelegy administration video online and have patient demonstrate understanding via the teach-back method. Review again with patient step-by-step instructions for use of the Trelegy inhaler. Patient verbalizes understanding and states that she will use it now. [name] on  importance of checking blood sugar and blood pressure regularly as directed. [name] Perform chart review and per note from [facility] nurse, Lucretia Field, patient's blood pressure was 132/78 on 12/23/2018 . Perform chart review and note that patient appears to have a Neurology note    Patient Self Care Activities:  . Calls pharmacy for medication refills . Calls provider office for new concerns or questions  . Patient will work on checking blood sugars twice daily and blood pressure daily and keeping log of each.  Please see past updates related to this goal by clicking on the "Past Updates" button in the selected goal          The patient verbalized understanding of instructions provided today and declined a print copy of patient instruction materials.   Telephone follow up appointment with CCM team member scheduled for: 12/26/18  [name], PharmD, [facility] [phone]

## 2018-12-24 NOTE — Chronic Care Management (AMB) (Signed)
  Chronic Care Management   Note  12/24/2018 Name: Kendra [name] MRN: [id number] DOB: [birth year]  Outreach call to [name] today to follow up regarding medication management and medication adherence. Was unable to reach patient by telephone and have left HIPAA compliant voicemail asking patient to return my call.    Follow up plan: The CM team will reach out to the patient again this afternoon.  [name], PharmD, [place] [employer] [phone]

## 2018-12-24 NOTE — Chronic Care Management (AMB) (Signed)
Chronic Care Management   Follow Up Note   12/24/2018 Name: JAYEL SCADUTO MRN: [id number] DOB: March 04, 1946  Referred by: [name], NP Reason for referral : Chronic Care Management (Patient Telephone Outreach)   Kendra [name] is a 73 y.o. year old female who is a primary care patient of [name], NP. The CCM team was consulted for assistance with chronic disease management and care coordination needs.    Outreach call to Ms. Cundari today to follow up regarding medication management and medication adherence. I speak with both Ms. Manago and her son [name].  Review of patient status, including review of consultants reports, relevant laboratory and other test results, and collaboration with appropriate care team members and the patient's provider was performed as part of comprehensive patient evaluation and provision of chronic care management services.    Goals Addressed            This Visit's Progress   . "I need help affording my medicine"        Current Barriers:  . Financial Barriers . Medication Adherence - reports missing doses due to financial barriers, but also reports no longer using weekly pillbox to manage medications. . Lack of glucometer and blood pressure results for clinical team . Knowledge deficits related to coordination of her own care o Reports not knowing who the name of her neurologist or when her follow up appointment is scheduled with him.  Pharmacist Clinical Goal(s):  [name] Over the next 30 days, patient will demonstrate improved medication adherence as evidenced by verbalized understanding of prescribed medication regimen, assistance available, and patient report of adherence . Over the next 30 days, patient will work with Consulting civil engineer to address needs related to diabetes disease management and education  . Over the next 30 days, patient will work with LCSW to address needs related to financial constraints  Over the next  14 days, patient will demonstrate improved adherence to blood sugar checks as evidenced by telephonic blood sugar review  Interventions:  Follow up with patient regarding medication adherence  Patient reports now having her Trelegy inhaler, but not having yet used the medication  Patient's son reports he was unable to pick up Vitamin D for the patient due to cost  Reports unable to start using weekly pillbox because unable to locate old pillbox  Advise patient and son of availability of Vitamin D and weekly pillbox at other stores in the area for cost savings  Patient states that she will ask her son to pick up both from [place] . Advise patient to review the Trelegy administration video online and have patient demonstrate understanding via the teach-back method after. Review again with patient step-by-step instructions for use of the Trelegy inhaler. Patient verbalizes understanding and states that she will use it now. [name] on importance of checking blood sugar and blood pressure regularly as directed. [name] Perform chart review and per note from [name] nurse, Lucretia Field, patient's blood pressure was 132/78 on 12/23/2018 . Perform chart review and note that patient appears to have a Neurology note indicating a follow up appointment scheduled for     Patient Self Care Activities:  . Currently UNABLE TO independently manage medications . Calls pharmacy for medication refills . Calls provider office for new concerns or questions  . Patient will work on checking blood sugars twice daily and blood pressure daily and keeping log of each.  Please see past updates related to this goal by  clicking on the "Past Updates" button in the selected goal          Plan  1) Will place a coordination of care call to patient's Neurologist's office to confirm patient's upcoming appointment on 01/08/19 and how patient will be contacted for this appointment. 2) Will place a coordination of care  call to [facility] nurse to discuss patient's medication adherence. 3) Telephone follow up appointment with CCM team member scheduled for: 12/26/2018 to discuss medication adherence and disease state management  -Patient to provide home blood sugar and blood pressure results 4) CCM Nurse Care Manager to follow up with patient within 7 days regarding diabetes education.  [name], PharmD, [place] [employer] [phone]

## 2018-12-25 ENCOUNTER — Ambulatory Visit: Payer: Self-pay | Admitting: Pharmacist

## 2018-12-25 DIAGNOSIS — Z794 Long term (current) use of insulin: Secondary | ICD-10-CM

## 2018-12-25 DIAGNOSIS — E1165 Type 2 diabetes mellitus with hyperglycemia: Secondary | ICD-10-CM

## 2018-12-25 DIAGNOSIS — I10 Essential (primary) hypertension: Secondary | ICD-10-CM

## 2018-12-25 DIAGNOSIS — J449 Chronic obstructive pulmonary disease, unspecified: Secondary | ICD-10-CM

## 2018-12-25 DIAGNOSIS — IMO0002 Reserved for concepts with insufficient information to code with codable children: Secondary | ICD-10-CM

## 2018-12-25 NOTE — Chronic Care Management (AMB) (Signed)
  Chronic Care Management   Follow Up Note   12/25/2018 Name: Kendra [name] MRN: [id number] DOB: 1946/08/16  Referred by: [name], NP Reason for referral : Care Coordination (Call to [place] RN)   Kendra [name] is a 73 y.o. year old female who is a primary care patient of [name], NP. The CCM team was consulted for assistance with chronic disease management and care coordination needs.    Place a coordination of care call to [place].  Goals Addressed            This Visit's Progress   . "I need help affording my medicine"        Current Barriers:  . Financial Barriers . Medication Adherence - reports missing doses due to financial barriers, but also reports no longer using weekly pillbox to manage medications. . Lack of glucometer and blood pressure results for clinical team . Knowledge deficits related to coordination of her own care o Reports not knowing who the name of her neurologist or when her follow up appointment is scheduled with him.  Pharmacist Clinical Goal(s):  [name] Over the next 30 days, patient will demonstrate improved medication adherence as evidenced by verbalized understanding of prescribed medication regimen, assistance available, and patient report of adherence . Over the next 30 days, patient will work with Consulting civil engineer to address needs related to diabetes disease management and education  . Over the next 30 days, patient will work with LCSW to address needs related to financial constraints  Over the next 14 days, patient will demonstrate improved adherence to blood sugar checks as evidenced by telephonic blood sugar review  Interventions: . Collaborate with [facility] RN, Lucretia Field, for patient's medication management and disease states management  o Home Health RN agrees to assisting patient and son with learning to accurately fill her weekly pillbox, as well as to encourage/assist patient  with checking her own daily blood pressure and blood sugars. o Reports next visit tomorrow afternoon and call me following the visit. o Agrees to place a referral to [place] Worker for assistance with resources for patient for affording adult diapers   Patient Self Care Activities:  . Currently UNABLE TO independently manage medications . Calls pharmacy for medication refills . Calls provider office for new concerns or questions  . Patient will work on checking blood sugars twice daily and blood pressure daily and keeping log of each.  Please see past updates related to this goal by clicking on the "Past Updates" button in the selected goal           Telephone follow up appointment with CCM team member scheduled for: 12/26/18  [name], PharmD, [facility] [phone]

## 2018-12-26 ENCOUNTER — Ambulatory Visit: Payer: Self-pay | Admitting: Pharmacist

## 2018-12-26 DIAGNOSIS — I10 Essential (primary) hypertension: Secondary | ICD-10-CM | POA: Diagnosis not present

## 2018-12-26 DIAGNOSIS — I69354 Hemiplegia and hemiparesis following cerebral infarction affecting left non-dominant side: Secondary | ICD-10-CM | POA: Diagnosis not present

## 2018-12-26 DIAGNOSIS — I69398 Other sequelae of cerebral infarction: Secondary | ICD-10-CM | POA: Diagnosis not present

## 2018-12-26 DIAGNOSIS — I6523 Occlusion and stenosis of bilateral carotid arteries: Secondary | ICD-10-CM | POA: Diagnosis not present

## 2018-12-26 DIAGNOSIS — H518 Other specified disorders of binocular movement: Secondary | ICD-10-CM | POA: Diagnosis not present

## 2018-12-26 DIAGNOSIS — J449 Chronic obstructive pulmonary disease, unspecified: Secondary | ICD-10-CM | POA: Diagnosis not present

## 2018-12-26 DIAGNOSIS — R69 Illness, unspecified: Secondary | ICD-10-CM | POA: Diagnosis not present

## 2018-12-26 DIAGNOSIS — E1142 Type 2 diabetes mellitus with diabetic polyneuropathy: Secondary | ICD-10-CM | POA: Diagnosis not present

## 2018-12-26 NOTE — Chronic Care Management (AMB) (Deleted)
  Care Management Note   Kendra [name] is a 73 y.o. year old female who is a primary care patient of [name], NP. The CM team was consulted for assistance with chronic disease management and care coordination.   I reached out to [name] by phone today. Outreach attempt # [phone]. Left a HIPAA compliant message on the patient's voicemail.   Follow Up Plan: {CCM FOLLOW UP ZTIW:58099}   [name], PharmD, [place] [employer] [phone]

## 2018-12-26 NOTE — Chronic Care Management (AMB) (Signed)
  Chronic Care Management   Follow Up Note   12/26/2018 Name: Kendra [name] MRN: [id number] DOB: 1945/10/08  Referred by: [name], NP Reason for referral : Chronic Care Management (Patient Telephone Outreach)   [name] is a 73 y.o. year old female who is a primary care patient of [name], NP. Call to follow up with [name] regarding medication adherence and disease state management.   Was unable to reach patient via telephone today and have left HIPAA compliant voicemail asking patient to return my call.   Plan  The CM team will reach out to the patient again over the next 7 days.   [name], PharmD, [place] [employer] [phone]

## 2018-12-27 DIAGNOSIS — I10 Essential (primary) hypertension: Secondary | ICD-10-CM | POA: Diagnosis not present

## 2018-12-27 DIAGNOSIS — I69398 Other sequelae of cerebral infarction: Secondary | ICD-10-CM | POA: Diagnosis not present

## 2018-12-27 DIAGNOSIS — I6523 Occlusion and stenosis of bilateral carotid arteries: Secondary | ICD-10-CM | POA: Diagnosis not present

## 2018-12-27 DIAGNOSIS — E1142 Type 2 diabetes mellitus with diabetic polyneuropathy: Secondary | ICD-10-CM | POA: Diagnosis not present

## 2018-12-27 DIAGNOSIS — I69354 Hemiplegia and hemiparesis following cerebral infarction affecting left non-dominant side: Secondary | ICD-10-CM | POA: Diagnosis not present

## 2018-12-27 DIAGNOSIS — R69 Illness, unspecified: Secondary | ICD-10-CM | POA: Diagnosis not present

## 2018-12-27 DIAGNOSIS — H518 Other specified disorders of binocular movement: Secondary | ICD-10-CM | POA: Diagnosis not present

## 2018-12-27 DIAGNOSIS — J449 Chronic obstructive pulmonary disease, unspecified: Secondary | ICD-10-CM | POA: Diagnosis not present

## 2018-12-30 ENCOUNTER — Ambulatory Visit: Payer: Self-pay | Admitting: *Deleted

## 2018-12-30 DIAGNOSIS — E1165 Type 2 diabetes mellitus with hyperglycemia: Secondary | ICD-10-CM | POA: Diagnosis not present

## 2018-12-30 DIAGNOSIS — E1142 Type 2 diabetes mellitus with diabetic polyneuropathy: Secondary | ICD-10-CM | POA: Diagnosis not present

## 2018-12-30 DIAGNOSIS — I6523 Occlusion and stenosis of bilateral carotid arteries: Secondary | ICD-10-CM | POA: Diagnosis not present

## 2018-12-30 DIAGNOSIS — I69398 Other sequelae of cerebral infarction: Secondary | ICD-10-CM | POA: Diagnosis not present

## 2018-12-30 DIAGNOSIS — R69 Illness, unspecified: Secondary | ICD-10-CM | POA: Diagnosis not present

## 2018-12-30 DIAGNOSIS — H518 Other specified disorders of binocular movement: Secondary | ICD-10-CM | POA: Diagnosis not present

## 2018-12-30 DIAGNOSIS — I10 Essential (primary) hypertension: Secondary | ICD-10-CM

## 2018-12-30 DIAGNOSIS — IMO0002 Reserved for concepts with insufficient information to code with codable children: Secondary | ICD-10-CM

## 2018-12-30 DIAGNOSIS — E785 Hyperlipidemia, unspecified: Secondary | ICD-10-CM | POA: Diagnosis not present

## 2018-12-30 DIAGNOSIS — Z794 Long term (current) use of insulin: Secondary | ICD-10-CM | POA: Diagnosis not present

## 2018-12-30 DIAGNOSIS — I69354 Hemiplegia and hemiparesis following cerebral infarction affecting left non-dominant side: Secondary | ICD-10-CM | POA: Diagnosis not present

## 2018-12-30 DIAGNOSIS — J449 Chronic obstructive pulmonary disease, unspecified: Secondary | ICD-10-CM | POA: Diagnosis not present

## 2018-12-31 NOTE — Patient Instructions (Signed)
Thank you allowing the Chronic Care Management Team to be a part of your care! It was a pleasure speaking with you today!  1. Review diabetic diet information sent to you in the mail 2. Please be on the look out for Glucerna coupons in your son's email   CCM (Chronic Care Management) Team   [name] RN, BSN Nurse Care Coordinator  [phone]  [name] PharmD  Clinical Pharmacist  [phone]  [place] Worker [phone]  Goals Addressed            This Visit's Progress   . "I need some help with my sugar" (pt-stated)       Current Barriers:  Kendra [name] Knowledge Deficits related to diabetes management  Nurse Case Manager Clinical Goal(s):  Kendra [name] Over the next 30 days, patient will verbalize understanding of plan for diabetes management . Over the next 30 days, patient will demonstrate improved health management independence as evidenced byperforming self health management activities such as checking cbg's/recording, working with CM team to address needs around chronic disease management  Interventions:  . Evaluation of current treatment plan related to diabetes management and patient's adherence to plan as established by provider. . Provided education to patient re: assistance being provided with medication management and importance of med adherence, self health management activities (see patient education provided) . Reviewed medications with patient and discussed collaboration with [facility] Clinic pharmacist . Collaborated with Pharmacist  regarding medication management needs . Signed patient up to receive Glucerna coupons via her son's email with their permission.  . Mailed Diabetes Diet education to patient as discussed.   Patient Self Care Activities:  . Currently UNABLE TO independently self manage diabetes . Performs ADL's independently . Calls provider office for new concerns or questions  Initial goal documentation     . "I want to  make sure I don't have that virus" (pt-stated)       Current Barriers:  Kendra [name] Knowledge Deficits related to chronic pulmonary disease (COPD) and current COVID19 pandemic  Nurse Case Manager Clinical Goal(s):  Kendra [name] Over the next 30 days, patient will verbalize understanding of plan for long term self health management related to chronic pulmonary disease and infectious disease prevention  Interventions:  . Evaluation of current treatment plan related to COPD management and patient's adherence to plan as established by provider. . Advised patient to maintain social distance and try not to leave her home unless necessary for provider appointments.  Provided education to patient re: COVID19 signs and symptoms vs usual symptoms (per her reported baseline) of moderate COPD.  Provided web information as requested:  Reviewed Covid19 prevention strategies.   For information about COVID-19 or "Corona Virus", the following web resources may be helpful: CDC: BeginnerSteps.be   [place]:  [email or web address].[email or web address]  Patient Self Care Activities:  . Performs ADL's independently . Calls provider office for new concerns or questions  Initial goal documentation        The patient verbalized understanding of instructions provided today and declined a print copy of patient instruction materials.   The CM team will reach out to the patient again over the next 30 days.     Carbohydrate Counting for Diabetes Mellitus, Adult  Carbohydrate counting is a method of keeping track of how many carbohydrates you eat. Eating carbohydrates naturally increases the amount of sugar (glucose) in the blood. Counting how many carbohydrates you eat helps keep your blood glucose within normal limits, which helps you manage  your diabetes (diabetes mellitus). It is important to know how many carbohydrates  you can safely have in each meal. This is different for every person. A diet and nutrition specialist (registered dietitian) can help you make a meal plan and calculate how many carbohydrates you should have at each meal and snack. Carbohydrates are found in the following foods:  Grains, such as breads and cereals.  Dried beans and soy products.  Starchy vegetables, such as potatoes, peas, and corn.  Fruit and fruit juices.  Milk and yogurt.  Sweets and snack foods, such as cake, cookies, candy, chips, and soft drinks. How do I count carbohydrates? There are two ways to count carbohydrates in food. You can use either of the methods or a combination of both. Reading "Nutrition Facts" on packaged food The "Nutrition Facts" list is included on the labels of almost all packaged foods and beverages in the [place] It includes:  The serving size.  Information about nutrients in each serving, including the grams (g) of carbohydrate per serving. To use the "Nutrition Facts":  Decide how many servings you will have.  Multiply the number of servings by the number of carbohydrates per serving.  The resulting number is the total amount of carbohydrates that you will be having. Learning standard serving sizes of other foods When you eat carbohydrate foods that are not packaged or do not include "Nutrition Facts" on the label, you need to measure the servings in order to count the amount of carbohydrates:  Measure the foods that you will eat with a food scale or measuring cup, if needed.  Decide how many standard-size servings you will eat.  Multiply the number of servings by 15. Most carbohydrate-rich foods have about 15 g of carbohydrates per serving. ? For example, if you eat 8 oz (170 g) of strawberries, you will have eaten 2 servings and 30 g of carbohydrates (2 servings x 15 g = 30 g).  For foods that have more than one food mixed, such as soups and casseroles, you must count the  carbohydrates in each food that is included. The following list contains standard serving sizes of common carbohydrate-rich foods. Each of these servings has about 15 g of carbohydrates:   hamburger bun or  English muffin.   oz (15 mL) syrup.   oz (14 g) jelly.  1 slice of bread.  1 six-inch tortilla.  3 oz (85 g) cooked rice or pasta.  4 oz (113 g) cooked dried beans.  4 oz (113 g) starchy vegetable, such as peas, corn, or potatoes.  4 oz (113 g) hot cereal.  4 oz (113 g) mashed potatoes or  of a large baked potato.  4 oz (113 g) canned or frozen fruit.  4 oz (120 mL) fruit juice.  4-6 crackers.  6 chicken nuggets.  6 oz (170 g) unsweetened dry cereal.  6 oz (170 g) plain fat-free yogurt or yogurt sweetened with artificial sweeteners.  8 oz (240 mL) milk.  8 oz (170 g) fresh fruit or one small piece of fruit.  24 oz (680 g) popped popcorn. Example of carbohydrate counting Sample meal  3 oz (85 g) chicken breast.  6 oz (170 g) brown rice.  4 oz (113 g) corn.  8 oz (240 mL) milk.  8 oz (170 g) strawberries with sugar-free whipped topping. Carbohydrate calculation 1. Identify the foods that contain carbohydrates: ? Rice. ? Corn. ? Milk. ? Strawberries. 2. Calculate how many servings you have of each  food: ? 2 servings rice. ? 1 serving corn. ? 1 serving milk. ? 1 serving strawberries. 3. Multiply each number of servings by 15 g: ? 2 servings rice x 15 g = 30 g. ? 1 serving corn x 15 g = 15 g. ? 1 serving milk x 15 g = 15 g. ? 1 serving strawberries x 15 g = 15 g. 4. Add together all of the amounts to find the total grams of carbohydrates eaten: ? 30 g + 15 g + 15 g + 15 g = 75 g of carbohydrates total. Summary  Carbohydrate counting is a method of keeping track of how many carbohydrates you eat.  Eating carbohydrates naturally increases the amount of sugar (glucose) in the blood.  Counting how many carbohydrates you eat helps keep your  blood glucose within normal limits, which helps you manage your diabetes.  A diet and nutrition specialist (registered dietitian) can help you make a meal plan and calculate how many carbohydrates you should have at each meal and snack. This information is not intended to replace advice given to you by your health care provider. Make sure you discuss any questions you have with your health care provider. Document Released: 09/04/2005 Document Revised: 03/14/2017 Document Reviewed: 02/16/2016 Elsevier Interactive Patient Education  2019 [employer].

## 2018-12-31 NOTE — Chronic Care Management (AMB) (Signed)
Chronic Care Management   Follow Up Note   12/31/2018 Name: Kendra [name] MRN: [id number] DOB: 01/27/1946  Referred by: [name], NP Reason for referral : Chronic Care Management (DM/COPD) and Care Coordination (Glucerna coupons, CCM PharmD)   Kendra [name] is a 73 y.o. year old female who is a primary care patient of [name], NP. The CCM team was consulted for assistance with chronic disease management and care coordination needs.    Review of patient status, including review of consultants reports, relevant laboratory and other test results, and collaboration with appropriate care team members and the patient's provider was performed as part of comprehensive patient evaluation and provision of chronic care management services.  Subjective: Spoke with patient on the phone. Patient expressed concerns with diabetes and the COVID19 virus. Patient expressed she was breathing better. She also expressed she would like to pursue obtaining dentures in the future.  (CCM will assist pt with this)   Objective:  Lab Results  Component Value Date   HGBA1C 7.8 (A) 09/30/2018    Goals Addressed            This Visit's Progress   . "I need some help with my sugar" (pt-stated)       Current Barriers:  [name] Knowledge Deficits related to diabetes management  Nurse Case Manager Clinical Goal(s):  [name] Over the next 30 days, patient will verbalize understanding of plan for diabetes management . Over the next 30 days, patient will demonstrate improved health management independence as evidenced byperforming self health management activities such as checking cbg's/recording, working with CM team to address needs around chronic disease management  Interventions:  . Evaluation of current treatment plan related to diabetes management and patient's adherence to plan as established by provider. . Provided education to patient re: assistance being provided with medication  management and importance of med adherence, self health management activities (see patient education provided) . Reviewed medications with patient and discussed collaboration with [facility] Clinic pharmacist . Collaborated with Pharmacist  regarding medication management needs . Signed patient up to receive Glucerna coupons via her son's email with their permission.  . Mailed Diabetes Diet education to patient as discussed.   Patient Self Care Activities:  . Currently UNABLE TO independently self manage diabetes . Performs ADL's independently . Calls provider office for new concerns or questions  Initial goal documentation     . "I want to make sure I don't have that virus" (pt-stated)       Current Barriers:  [name] Knowledge Deficits related to chronic pulmonary disease (COPD) and current COVID19 pandemic  Nurse Case Manager Clinical Goal(s):  [name] Over the next 30 days, patient will verbalize understanding of plan for long term self health management related to chronic pulmonary disease and infectious disease prevention  Interventions:  . Evaluation of current treatment plan related to COPD management and patient's adherence to plan as established by provider. . Advised patient to maintain social distance and try not to leave her home unless necessary for provider appointments.  Provided education to patient re: COVID19 signs and symptoms vs usual symptoms (per her reported baseline) of moderate COPD.  Provided web information as requested:  Reviewed Covid19 prevention strategies.   For information about COVID-19 or "Corona Virus", the following web resources may be helpful: CDC: BeginnerSteps.be   [place]:  [email or web address]  Patient Self Care Activities:  . Performs ADL's independently . Calls provider office for new concerns or questions  Initial  goal  documentation         The CM team will reach out to the patient again over the next 30 days.    Americus Scheurich RN, BSN Nurse Case Manager [name] Care Management  [phone]) Business Mobile

## 2019-01-01 ENCOUNTER — Ambulatory Visit: Payer: Self-pay | Admitting: Pharmacist

## 2019-01-01 DIAGNOSIS — R69 Illness, unspecified: Secondary | ICD-10-CM | POA: Diagnosis not present

## 2019-01-01 DIAGNOSIS — E559 Vitamin D deficiency, unspecified: Secondary | ICD-10-CM

## 2019-01-01 DIAGNOSIS — I69398 Other sequelae of cerebral infarction: Secondary | ICD-10-CM | POA: Diagnosis not present

## 2019-01-01 DIAGNOSIS — J449 Chronic obstructive pulmonary disease, unspecified: Secondary | ICD-10-CM

## 2019-01-01 DIAGNOSIS — H518 Other specified disorders of binocular movement: Secondary | ICD-10-CM | POA: Diagnosis not present

## 2019-01-01 DIAGNOSIS — IMO0002 Reserved for concepts with insufficient information to code with codable children: Secondary | ICD-10-CM

## 2019-01-01 DIAGNOSIS — E1142 Type 2 diabetes mellitus with diabetic polyneuropathy: Secondary | ICD-10-CM | POA: Diagnosis not present

## 2019-01-01 DIAGNOSIS — I10 Essential (primary) hypertension: Secondary | ICD-10-CM

## 2019-01-01 DIAGNOSIS — I6523 Occlusion and stenosis of bilateral carotid arteries: Secondary | ICD-10-CM | POA: Diagnosis not present

## 2019-01-01 DIAGNOSIS — I69354 Hemiplegia and hemiparesis following cerebral infarction affecting left non-dominant side: Secondary | ICD-10-CM | POA: Diagnosis not present

## 2019-01-01 DIAGNOSIS — Z794 Long term (current) use of insulin: Principal | ICD-10-CM

## 2019-01-01 DIAGNOSIS — E1165 Type 2 diabetes mellitus with hyperglycemia: Secondary | ICD-10-CM

## 2019-01-01 NOTE — Patient Instructions (Signed)
Thank you allowing the Chronic Care Management Team to be a part of your care! It was a pleasure speaking with you today!     CCM (Chronic Care Management) Team    [name] RN, BSN Nurse Care Coordinator  [phone]   [name] PharmD  Clinical Pharmacist  [phone]   [name] LCSW Clinical Social Worker [phone]  Visit Information  Goals Addressed            This Visit's Progress   . "I need help affording my medicine"        Current Barriers:  . Financial Barriers . Medication Adherence - improved with use of weekly pillbox and resolution of insurance billing issues . Knowledge deficits related to coordination of her own care  Pharmacist Clinical Goal(s):  [name] Over the next 30 days, patient will demonstrate improved medication adherence as evidenced by verbalized understanding of prescribed medication regimen, assistance available, and patient report of adherence . Over the next 30 days, patient will work with Consulting civil engineer to address needs related to diabetes disease management and education  . Over the next 30 days, patient will work with LCSW to address needs related to financial constraints  Over the next 14 days, patient will demonstrate improved adherence to blood sugar checks as evidenced by telephonic blood sugar review  Interventions: . Perform chart review - per notes from [place], she assisted patient with filling her weekly pillbox and is working with patient to check her blood pressure and blood sugar daily . Counseling on medication adherence o Patient reports that she has all of her medications, except for the Vitamin D, which her son is picking up from the pharmacy today o Reports successfully using her weekly pillbox as directed with the assistance of home health RN.  o Confirms using Trelegy inhaler once daily as directed and albuterol inhaler as needed . Address patient's medication question- patient  reports that some days she has an upset stomach after taking her medications. o Counsel patient that when restarting taking metformin, this medication can cause some upset stomach, but to continue taking it consistently and with her breakfast to improve tolerability.  Congratulate patient on keeping track of her blood pressure and blood sugar and encourage patient to continue keeping these logs.  Place coordination of care call to [facility] Neurology [phone]) to confirm patient has appointment with Neurologist, Dr. Rex Kras, for Wednesday, 01/08/2019 at 1 pm (provider to call patient on her cell phone).  Provide patient with time and provider's instructions for this appointment. Patient writes down details.   Patient Self Care Activities:  . Currently able to manage medications with assistance of Home Health RN and son . Calls pharmacy for medication refills . Calls provider office for new concerns or questions  . Patient will check blood sugars twice daily and blood pressure daily and keeping log of each. o Reports that her blood pressure was 120/70, HR 86 today Date Fasting Blood Glucose After Breakfast  13 - April 120   14 - April 119 129  15 - April 101    Please see past updates related to this goal by clicking on the "Past Updates" button in the selected goal          The patient verbalized understanding of instructions provided today and declined a print copy of patient instruction materials.   Telephone follow up appointment with CCM team member scheduled for: 01/08/2019  [name], PharmD, BCACP Clinical  Pharmacist [place] [employer] [phone]

## 2019-01-01 NOTE — Chronic Care Management (AMB) (Signed)
Chronic Care Management   Follow Up Note   01/01/2019 Name: Kendra [name] MRN: [id number] DOB: [birth year]  Referred by: [name], NP Reason for referral : Chronic Care Management (Patient Telephone Call) and [place] is a 73 y.o. year old female who is a primary care patient of [name], NP. The CCM team was consulted for assistance with chronic disease management and care coordination needs.    I reached out to [name] by phone today to follow up regarding medication adherence as well as diabetes and hypertension management.  Review of patient status, including review of consultants reports, relevant laboratory and other test results, and collaboration with appropriate care team members and the patient's provider was performed as part of comprehensive patient evaluation and provision of chronic care management services.    Goals Addressed            This Visit's Progress   . "I need help affording my medicine"        Current Barriers:  . Financial Barriers . Medication Adherence - improved with use of weekly pillbox and resolution of insurance billing issues . Knowledge deficits related to coordination of her own care  Pharmacist Clinical Goal(s):  [name] Over the next 30 days, patient will demonstrate improved medication adherence as evidenced by verbalized understanding of prescribed medication regimen, assistance available, and patient report of adherence . Over the next 30 days, patient will work with Consulting civil engineer to address needs related to diabetes disease management and education  . Over the next 30 days, patient will work with LCSW to address needs related to financial constraints  Over the next 14 days, patient will demonstrate improved adherence to blood sugar checks as evidenced by telephonic blood sugar review  Interventions: . Perform chart review - per notes from [place], she  assisted patient with filling her weekly pillbox and is working with patient to check her blood pressure and blood sugar daily . Counseling on medication adherence o Patient reports that she has all of her medications, except for the Vitamin D, which her son is picking up from the pharmacy today o Reports successfully using her weekly pillbox as directed with the assistance of home health RN.  o Confirms using Trelegy inhaler once daily as directed and albuterol inhaler as needed . Address patient's medication question- patient reports that some days she has an upset stomach after taking her medications. o Counsel patient that when restarting taking metformin, this medication can cause some upset stomach, but to continue taking it consistently and with her breakfast to improve tolerability.  Congratulate patient on keeping track of her blood pressure and blood sugar and encourage patient to continue keeping these logs.  Place coordination of care call to [facility] Neurology [phone]) to confirm patient has appointment with Neurologist, Dr. Rex Kras, for Wednesday, 01/08/2019 at 1 pm (provider to call patient on her cell phone).  Provide patient with time and provider's instructions for this appointment. Patient writes down details.   Patient Self Care Activities:  . Currently able to manage medications with assistance of Home Health RN and son . Calls pharmacy for medication refills . Calls provider office for new concerns or questions  . Patient will check blood sugars twice daily and blood pressure daily and keeping log of each. o Reports that her blood pressure was 120/70, HR 86 today Date Fasting Blood Glucose After Breakfast  13 - April 120  14 - April 119 129  15 - April 101    Please see past updates related to this goal by clicking on the "Past Updates" button in the selected goal          PLAN  Telephone follow up appointment with CCM Pharmacist scheduled for: 01/08/2019 at 2 pm   [name], PharmD, [place] [phone]

## 2019-01-08 ENCOUNTER — Ambulatory Visit: Payer: Self-pay | Admitting: Pharmacist

## 2019-01-08 ENCOUNTER — Other Ambulatory Visit: Payer: Self-pay

## 2019-01-08 DIAGNOSIS — E1165 Type 2 diabetes mellitus with hyperglycemia: Secondary | ICD-10-CM

## 2019-01-08 DIAGNOSIS — IMO0002 Reserved for concepts with insufficient information to code with codable children: Secondary | ICD-10-CM

## 2019-01-08 DIAGNOSIS — Z8673 Personal history of transient ischemic attack (TIA), and cerebral infarction without residual deficits: Secondary | ICD-10-CM | POA: Diagnosis not present

## 2019-01-08 DIAGNOSIS — Z794 Long term (current) use of insulin: Secondary | ICD-10-CM | POA: Diagnosis not present

## 2019-01-08 DIAGNOSIS — J449 Chronic obstructive pulmonary disease, unspecified: Secondary | ICD-10-CM | POA: Diagnosis not present

## 2019-01-08 DIAGNOSIS — Z720 Tobacco use: Secondary | ICD-10-CM | POA: Diagnosis not present

## 2019-01-08 DIAGNOSIS — I10 Essential (primary) hypertension: Secondary | ICD-10-CM | POA: Diagnosis not present

## 2019-01-08 DIAGNOSIS — E785 Hyperlipidemia, unspecified: Secondary | ICD-10-CM | POA: Diagnosis not present

## 2019-01-08 NOTE — Patient Instructions (Signed)
Thank you allowing the Chronic Care Management Team to be a part of your care! It was a pleasure speaking with you today!     CCM (Chronic Care Management) Team    [name] RN, BSN Nurse Care Coordinator  [phone]   [name] PharmD  Clinical Pharmacist  [phone]   [name] LCSW Clinical Social Worker [phone]  Visit Information     . Medication Adherence       Current Barriers:  . Financial Barriers . Knowledge deficits related to coordination of her own care . Lack of glucometer and blood pressure results for clinical team  Pharmacist Clinical Goal(s):  [name] Over the next 30 days, patient will demonstrate improved medication adherence as evidenced by verbalized understanding of prescribed medication regimen, assistance available, and patient report of adherence  Over the next 30 days, patient will demonstrate improved adherence to blood sugar checks as evidenced by telephonic blood sugar review  Interventions: . Perform chart review - per notes from [facility], patient has been discharged from home health nursing, as her goals were completed. Patient confirms that she has been told that she was discharged, but to call if she needs future assistance. . Counseling on medication adherence o Patient reports that she has all of her medications, including her Vitamin D o Reports that the [facility] RN has been assisting her with filling the pillbox, but that her daughter-in-law, [name], will help her with filling pillbox moving forward. [name] is agreeable. o Counsel patient regarding how to best use pillbox to aid with adherence o [name] confirms having filled weekly pillboxes in past; counsel on method  . Follow up regarding patient's previous reports of stomach upset with restart of metformin o Patient confirms that side effect has resolved with consistently taking the medication  Counsel patient on the importance of continuing  to monitor her own blood pressure and blood sugar.  Patient reports that she is unable to find her blood pressure monitor today. Counsel patient on the importance of finding this and continuing to check blood pressure.  Counsel patient and daughter-in- law on signs of low blood sugar and management of lows  Patient reports having one recent low blood sugar last week on Wednesday afternoon.   Patient states that she checked her blood sugar and that it was in the 40s, but that she did not write this down. Reports that she felt woozy/weak and treated the low with orange juice and when she checked her blood sugar again in 15 minutes, her blood sugar was back up in the 90s.  Discuss with patient and daughter-in-law the importance of eating regular meals, particularly to avoid low blood sugars  Patient asks about coupons for Glucerna that she discussed with CCM Nurse Case Manager. Advise patient that these were sent to her son's email. Ms. Goecke to check with her son about the coupons and then follow up with me or CCM Nurse Case Manager if needed.  Confirms she kept telephone appointment with Neurologist this afternoon. Will review note in chart once available.  Confirm that patient has and provide daughter-in-law with contact phone numbers for me and CCM nurse. . Counsel patient that her PCP will be out of the office for the next three months, but that [name] will be covering on her behalf.   Patient Self Care Activities:  . Currently able to manage medications with assistance of Home Health RN and son . Calls pharmacy for medication  refills . Calls provider office for new concerns or questions  . Patient currently unable to  check blood sugars twice daily and blood pressure daily and keeping log of each. Date Fasting Blood Glucose 2 hours After Breakfast Afternoon? Comments  15- April   "in the 74s"* * see notes above  16 - April  129    17 - April      18 - April      19- April       20-April      21- April  119    22- April  120     Please see past updates related to this goal by clicking on the "Past Updates" button in the selected goal          The patient verbalized understanding of instructions provided today and declined a print copy of patient instruction materials.   Telephone follow up appointment with CCM Pharmacist scheduled for: 01/15/19  [name], PharmD, [facility] [phone]

## 2019-01-08 NOTE — Chronic Care Management (AMB) (Signed)
Chronic Care Management   Follow Up Note   01/08/2019 Name: Kendra [name] MRN: [id number] DOB: [birth year]  Referred by: [name], NP Reason for referral : Chronic Care Management (Patient Phone Call) and Care Coordination   Kendra [name] is a 73 y.o. year old female who is a primary care patient of [name], NP. The CCM team was consulted for assistance with chronic disease management and care coordination needs.  Ms. Audi has a medical history which includes but is not limited to recent CVA, Type 2 Diabetes, hypertension, hyperlipidemia and COPD.  I reached out to [name] by phone today. Spoke with patient and her daughter-in-law, Kendra [name] (permission given by patient).  Review of patient status, including review of consultants reports, relevant laboratory and other test results, and collaboration with appropriate care team members and the patient's provider was performed as part of comprehensive patient evaluation and provision of chronic care management services.    Goals Addressed            This Visit's Progress   . Medication Adherence       Current Barriers:  . Financial Barriers . Knowledge deficits related to coordination of her own care . Lack of glucometer and blood pressure results for clinical team  Pharmacist Clinical Goal(s):  [name] Over the next 30 days, patient will demonstrate improved medication adherence as evidenced by verbalized understanding of prescribed medication regimen, assistance available, and patient report of adherence  Over the next 30 days, patient will demonstrate improved adherence to blood sugar checks as evidenced by telephonic blood sugar review  Interventions: . Perform chart review - per notes from [facility], patient has been discharged from home health nursing, as her goals were completed. Patient confirms that she has been told that she was discharged, but to call if she needs  future assistance. . Counseling on medication adherence o Patient reports that she has all of her medications, including her Vitamin D o Reports that the [place] RN has been assisting her with filling the pillbox, but that her daughter-in-law, Kendra [name], will help her with filling pillbox moving forward. Kendra [name] is agreeable. o Counsel patient regarding how to best use pillbox to aid with adherence o Kendra [name] confirms having filled weekly pillboxes in past; counsel on method  . Follow up regarding patient's previous reports of stomach upset with restart of metformin o Patient confirms that side effect has resolved with consistently taking the medication  Counsel patient on the importance of continuing to monitor her own blood pressure and blood sugar.  Patient reports that she is unable to find her blood pressure monitor today. Counsel patient on the importance of finding this and continuing to check blood pressure.  Counsel patient and daughter-in- law on signs of low blood sugar and management of lows  Patient reports having one recent low blood sugar last week on Wednesday afternoon.   Patient states that she checked her blood sugar and that it was in the 40s, but that she did not write this down. Reports that she felt woozy/weak and treated the low with orange juice and when she checked her blood sugar again in 15 minutes, her blood sugar was back up in the 90s.  Discuss with patient and daughter-in-law the importance of eating regular meals, particularly to avoid low blood sugars  Patient asks about coupons for Glucerna that she discussed with CCM Nurse Case Manager. Advise patient that these were sent to  her son's email. Ms. Windish to check with her son about the coupons and then follow up with me or CCM Nurse Case Manager if needed.  Confirms she kept telephone appointment with Neurologist this afternoon. Will review note in chart once available.  Confirm that patient has and provide  daughter-in-law with contact phone numbers for me and CCM nurse. . Counsel patient that her PCP will be out of the office for the next three months, but that [name] will be covering on her behalf.   Patient Self Care Activities:  . Currently able to manage medications with assistance of Home Health RN and son . Calls pharmacy for medication refills . Calls provider office for new concerns or questions  . Patient currently unable to  check blood sugars twice daily and blood pressure daily and keeping log of each. Date Fasting Blood Glucose 2 hours After Breakfast Afternoon? Comments  15- April   "in the 20s"* * see notes above  16 - April  129    17 - April      18 - April      19- April      20-April      21- April  119    22- April  120     Please see past updates related to this goal by clicking on the "Past Updates" button in the selected goal          Plan  1) Patient to locate blood pressure monitor in home with help of family 2) Patient to start consistently checking blood sugar and blood pressure and keep log 3) Patient to work on eating regular meals 4) Patient's daughter-in-law to assist patient with filling pillbox weekly. 5) Telephone follow up appointment with CCM Pharmacist scheduled for: 01/15/2019 at 2 pm  [name], PharmD, [place] [phone]

## 2019-01-15 ENCOUNTER — Other Ambulatory Visit: Payer: Self-pay

## 2019-01-15 ENCOUNTER — Ambulatory Visit: Payer: Self-pay | Admitting: Pharmacist

## 2019-01-15 DIAGNOSIS — Z794 Long term (current) use of insulin: Principal | ICD-10-CM

## 2019-01-15 DIAGNOSIS — IMO0002 Reserved for concepts with insufficient information to code with codable children: Secondary | ICD-10-CM

## 2019-01-15 DIAGNOSIS — J449 Chronic obstructive pulmonary disease, unspecified: Secondary | ICD-10-CM | POA: Diagnosis not present

## 2019-01-15 DIAGNOSIS — E1165 Type 2 diabetes mellitus with hyperglycemia: Secondary | ICD-10-CM | POA: Diagnosis not present

## 2019-01-15 DIAGNOSIS — I10 Essential (primary) hypertension: Secondary | ICD-10-CM | POA: Diagnosis not present

## 2019-01-15 DIAGNOSIS — E785 Hyperlipidemia, unspecified: Secondary | ICD-10-CM | POA: Diagnosis not present

## 2019-01-15 NOTE — Chronic Care Management (AMB) (Signed)
Chronic Care Management   Follow Up Note   01/15/2019 Name: MEILING HENDRIKS MRN: [id number] DOB: [birth year]  Referred by: [name], NP Reason for referral : Chronic Care Management (Patient Phone Cal)   ZILLAH ALEXIE is a 73 y.o. year old female who is a primary care patient of [name], NP. The CCM team was consulted for assistance with chronic disease management and care coordination needs.  Ms. Irani has a medical history which includes but is not limited to recent CVA, Type 2 Diabetes, hypertension, hyperlipidemia and COPD.  I reached out to [name] by phone today. Spoke with patient and her daughter-in-law, [name] (permission given by patient).  Review of patient status, including review of consultants reports, relevant laboratory and other test results, and collaboration with appropriate care team members and the patient's provider was performed as part of comprehensive patient evaluation and provision of chronic care management services.    Goals Addressed            This Visit's Progress   . Medication Adherence       Current Barriers:  . Financial Barriers . Knowledge deficits related to coordination of her own care . Lack of glucometer and blood pressure results for clinical team  Pharmacist Clinical Goal(s):  [name] Over the next 30 days, patient will demonstrate improved medication adherence as evidenced by verbalized understanding of prescribed medication regimen, assistance available, and patient report of adherence  Over the next 30 days, patient will demonstrate improved adherence to blood sugar checks as evidenced by telephonic blood sugar review  Interventions: . Counsel on medication adherence o Patient reports that she has all of her medications. o Reports that she has been filling her own pillbox and denies any missed doses. o Again discuss having daughter-in-law, [name] help her with filling pillbox moving forward.  Patient and [name] are both agreeable. [name] states that she will start with patient today. o Patient denies having a current medication list. - Will mail patient a current medication list  o Patient reports now only using Trelegy inhaler as needed. Counsel patient and daughter-in-law about importance of using Trelegy inhaler everyday as directed and using albuterol inhaler as needed for shortness of breath. - Patient confirms rinsing mouth out after each use of Trelegy.  Counsel patient on the importance of monitoring her own blood pressure and blood sugar.  Patient reports that she has found her blood pressure monitor and has been checking, but that she did not write down dates or times with her readings.  Will send patient blood pressure and blood sugar log sheets  Reports difficulty with checking her own blood sugar due to pain in fingers from testing. Counsel patient on monitoring technique.  . Discuss smoking cessation with patient. Patient expresses interest in quitting smoking, using nicotine patches to aid her. Note that Nicotine patches have been sent by her Neurologist to her Pharmacy, but patches are not covered through her plan. . Patient asks about Zio Patch ordered by [facility].  o Will collaborate with [facility] about how patient is to obtain this monitoring device.   Patient Self Care Activities:  . Currently able to manage medications with assistance of Home Health RN and son . Calls pharmacy for medication refills . Calls provider office for new concerns or questions  . Patient currently unable to  check blood sugars twice daily and blood pressure daily and keeping log of each. Date Fasting Blood Glucose Before Lunch  70- April 115   24 - April  96  25 - April    26 - April    27 - April 96    Denies any low blood sugars over past week.  Please see past updates related to this goal by clicking on the "Past Updates" button in the selected goal           Plan  1) Will mail patient blood sugar log, blood pressure log and medication list as requested. 2) Will follow up with [facility] regarding ordered Zio patch for patient 3) Will evaluate possible assistance options for cost of nicotine patches for patient. 4) CM Pharmacist will reach out to the patient again over the next 3 days.   [name], PharmD, [place] [employer] [phone]

## 2019-01-16 ENCOUNTER — Ambulatory Visit: Payer: Self-pay | Admitting: Pharmacist

## 2019-01-16 DIAGNOSIS — Z794 Long term (current) use of insulin: Principal | ICD-10-CM

## 2019-01-16 DIAGNOSIS — Z8673 Personal history of transient ischemic attack (TIA), and cerebral infarction without residual deficits: Secondary | ICD-10-CM

## 2019-01-16 DIAGNOSIS — E1165 Type 2 diabetes mellitus with hyperglycemia: Secondary | ICD-10-CM

## 2019-01-16 DIAGNOSIS — IMO0002 Reserved for concepts with insufficient information to code with codable children: Secondary | ICD-10-CM

## 2019-01-16 NOTE — Patient Instructions (Signed)
Thank you allowing the Chronic Care Management Team to be a part of your care! It was a pleasure speaking with you today!     CCM (Chronic Care Management) Team    [name] RN, BSN Nurse Care Coordinator  [phone]   [name] PharmD  Clinical Pharmacist  [phone]   [name] LCSW Clinical Social Worker [phone]  Visit Information  Goals Addressed            This Visit's Progress   . Medication Adherence       Current Barriers:  . Financial Barriers . Knowledge deficits related to coordination of her own care . Lack of glucometer and blood pressure results for clinical team  Pharmacist Clinical Goal(s):  [name] Over the next 30 days, patient will demonstrate improved medication adherence as evidenced by verbalized understanding of prescribed medication regimen, assistance available, and patient report of adherence  Over the next 30 days, patient will demonstrate improved adherence to blood sugar checks as evidenced by telephonic blood sugar review  Interventions: . Counsel on medication adherence o Patient and daughter-in-law, [name], to refill pillbox together weekly  Counsel patient on the importance of monitoring her own blood pressure and blood sugar. . Discuss smoking cessation with patient. Patient expresses interest in quitting smoking, using nicotine patches to aid her. Note that Nicotine patches have been sent by her Neurologist to her Pharmacy, but patches are not covered through her plan. Advise patient to call [employer] [place] [phone]) for assistance with quitting o Note that [employer] can provide nicotine patches to patients for free.  Collaborate with [facility] regarding Zio Patch for cardiac monitoring.  Per chart, order placed on 01/08/2019.  Leave message for Ivin Booty, [place] with Dr. Rex Kras requesting that the office follow up with the patient to schedule placement.   Patient Self Care Activities:  . Currently able to  manage medications with assistance of daughter-in-law and son . Calls pharmacy for medication refills . Calls provider office for new concerns or questions  . Patient currently unable to  check blood sugars twice daily and blood pressure daily and keeping log of each.   Please see past updates related to this goal by clicking on the "Past Updates" button in the selected goal          The patient verbalized understanding of instructions provided today and declined a print copy of patient instruction materials.   Telephone follow up appointment with CCM team member scheduled for: 01/22/2019  [name], PharmD, [facility] [phone]

## 2019-01-16 NOTE — Patient Instructions (Signed)
Thank you allowing the Chronic Care Management Team to be a part of your care! It was a pleasure speaking with you today!     CCM (Chronic Care Management) Team    [name] RN, BSN Nurse Care Coordinator  [phone]   [name] PharmD  Clinical Pharmacist  [phone]   [name] LCSW Clinical Social Worker [phone]  Visit Information  Goals Addressed            This Visit's Progress   . Medication Adherence       Current Barriers:  . Financial Barriers . Knowledge deficits related to coordination of her own care . Lack of glucometer and blood pressure results for clinical team  Pharmacist Clinical Goal(s):  [name] Over the next 30 days, patient will demonstrate improved medication adherence as evidenced by verbalized understanding of prescribed medication regimen, assistance available, and patient report of adherence  Over the next 30 days, patient will demonstrate improved adherence to blood sugar checks as evidenced by telephonic blood sugar review  Interventions: . Counsel on medication adherence o Patient reports that she has all of her medications. o Reports that she has been filling her own pillbox and denies any missed doses. o Again discuss having daughter-in-law, [name] help her with filling pillbox moving forward. Patient and [name] are both agreeable. [name] states that she will start with patient today. o Patient denies having a current medication list. - Will mail patient a current medication list  o Patient reports now only using Trelegy inhaler as needed. Counsel patient and daughter-in-law about importance of using Trelegy inhaler everyday as directed and using albuterol inhaler as needed for shortness of breath. - Patient confirms rinsing mouth out after each use of Trelegy.  Counsel patient on the importance of monitoring her own blood pressure and blood sugar.  Patient reports that she has found her blood pressure monitor and has  been checking, but that she did not write down dates or times with her readings.  Will send patient blood pressure and blood sugar log sheets  Reports difficulty with checking her own blood sugar due to pain in fingers from testing. Counsel patient on monitoring technique.  . Discuss smoking cessation with patient. Patient expresses interest in quitting smoking, using nicotine patches to aid her. Note that Nicotine patches have been sent by her Neurologist to her Pharmacy, but patches are not covered through her plan. . Patient asks about Zio Patch ordered by [facility].  o Will collaborate with [name] Neu[facility] about how patient is to obtain this monitoring device.   Patient Self Care Activities:  . Currently able to manage medications with assistance of family members . Calls pharmacy for medication refills . Calls provider office for new concerns or questions  . Patient currently unable to  check blood sugars twice daily and blood pressure daily and keeping log of each. Date Fasting Blood Glucose Before Lunch  23- April 115   24 - April  96  25 - April    26 - April    27 - April 96    Denies any low blood sugars over past week.  Please see past updates related to this goal by clicking on the "Past Updates" button in the selected goal          The patient verbalized understanding of instructions provided today and declined a print copy of patient instruction materials.   The CM team will reach out to the patient again over the  next 3 days.   [name], PharmD, [place] [employer] [phone]

## 2019-01-16 NOTE — Chronic Care Management (AMB) (Signed)
  Chronic Care Management   Follow Up Note   01/16/2019 Name: Kendra [name] MRN: [id number] DOB: [birth year]  Referred by: [name], NP Reason for referral : Chronic Care Management (Patient Phone Call) and Care Coordination [facility])   Kendra [name] is a 73 y.o. year old female who is a primary care patient of [name], NP. The CCM team was consulted for assistance with chronic disease management and care coordination needs.  Kendra [name] has a medical history which includes but is not limited to recent CVA, Type 2 Diabetes, hypertension, hyperlipidemia and COPD.  Coordination of care call to [facility] Neurology to follow up about scheduling placement of Zio Patch cardiac monitor for patient. Leave a message for Ivin Booty, CMA with Dr. Rex Kras  I reached out to [name] by phone today.   Review of patient status, including review of consultants reports, relevant laboratory and other test results, and collaboration with appropriate care team members and the patient's provider was performed as part of comprehensive patient evaluation and provision of chronic care management services.    Goals Addressed            This Visit's Progress   . Medication Adherence       Current Barriers:  . Financial Barriers . Knowledge deficits related to coordination of her own care . Lack of glucometer and blood pressure results for clinical team  Pharmacist Clinical Goal(s):  [name] Over the next 30 days, patient will demonstrate improved medication adherence as evidenced by verbalized understanding of prescribed medication regimen, assistance available, and patient report of adherence  Over the next 30 days, patient will demonstrate improved adherence to blood sugar checks as evidenced by telephonic blood sugar review  Interventions: . Counsel on medication adherence o Patient and daughter-in-law, [name], to refill pillbox together weekly  Counsel patient on the  importance of monitoring her own blood pressure and blood sugar. . Discuss smoking cessation with patient. Patient expresses interest in quitting smoking, using nicotine patches to aid her. Note that Nicotine patches have been sent by her Neurologist to her Pharmacy, but patches are not covered through her plan. Advise patient to call [employer] [place] [phone]) for assistance with quitting o Note that [employer] can provide nicotine patches to patients for free.  Collaborate with [facility] Neurology regarding Zio Patch for cardiac monitoring.  Per chart, order placed on 01/08/2019.  Leave message for Ivin Booty, [place] with Dr. Rex Kras requesting that the office follow up with the patient to schedule placement.   Patient Self Care Activities:  . Currently able to manage medications with assistance of daughter-in-law and son . Calls pharmacy for medication refills . Calls provider office for new concerns or questions  . Patient currently unable to  check blood sugars twice daily and blood pressure daily and keeping log of each.   Please see past updates related to this goal by clicking on the "Past Updates" button in the selected goal          Plan  Telephone follow up appointment with CCM team member scheduled for: 01/22/2019.  [name], PharmD, [place] [employer] [phone]

## 2019-01-20 ENCOUNTER — Ambulatory Visit: Payer: Medicare HMO | Admitting: *Deleted

## 2019-01-20 DIAGNOSIS — Z794 Long term (current) use of insulin: Principal | ICD-10-CM

## 2019-01-20 DIAGNOSIS — J449 Chronic obstructive pulmonary disease, unspecified: Secondary | ICD-10-CM

## 2019-01-20 DIAGNOSIS — IMO0002 Reserved for concepts with insufficient information to code with codable children: Secondary | ICD-10-CM

## 2019-01-20 DIAGNOSIS — E1165 Type 2 diabetes mellitus with hyperglycemia: Secondary | ICD-10-CM

## 2019-01-20 NOTE — Patient Instructions (Signed)
Thank you allowing the Chronic Care Management Team to be a part of your care! It was a pleasure speaking with you today!  CCM (Chronic Care Management) Team   Benicia Antolin RN, BSN Nurse Care Coordinator  [phone]  [name] PharmD  Clinical Pharmacist  [phone]  [name] LCSW Clinical Social Worker [phone]  Goals Addressed            This Visit's Progress   . "I need some help with my sugar" (pt-stated)       Current Barriers:  [name] Knowledge Deficits related to diabetes management  Nurse Case Manager Clinical Goal(s):  [name] Over the next 30 days, patient will verbalize understanding of plan for diabetes management . Over the next 30 days, patient will demonstrate improved health management independence as evidenced byperforming self health management activities such as checking cbg's/recording, working with CM team to address needs around chronic disease management  Interventions:  . Evaluation of current treatment plan related to diabetes management and patient's adherence to plan as established by provider. . Provided education to patient re: assistance being provided with medication management and importance of med adherence, self health management activities (see patient education provided) . Reviewed medications with patient and discussed collaboration with CM Clinic pharmacist . Collaborated with Pharmacist  regarding medication management needs . Signed patient up to receive Glucerna coupons via her son's email with their permission. . Mailed Diabetes Diet education to patient as discussed.  . Reviewed diabetes diet information-Patient stating she drinks OJ with most meals discussed options . Patient has not received glucerna coupons will re-do application  Patient Self Care Activities:  . Currently UNABLE TO independently self manage diabetes . Performs ADL's independently . Calls provider office for new concerns or questions  Please see  past updates related to this goal by clicking on the "Past Updates" button in the selected goal         The patient verbalized understanding of instructions provided today and declined a print copy of patient instruction materials.   The CM team will reach out to the patient again over the next 30 days.  The patient has been provided with contact information for the chronic care management team and has been advised to call with any health related questions or concerns.

## 2019-01-20 NOTE — Chronic Care Management (AMB) (Signed)
  Chronic Care Management   Follow Up Note   01/20/2019 Name: Kendra [name] MRN: [id number] DOB: March 23, 1946  Referred by: Kendra College, NP Reason for referral : Chronic Care Management (DM/COPD)   Kendra [name] is a 73 y.o. year old female who is a primary care patient of Kendra College, NP. The CCM team was consulted for assistance with chronic disease management and care coordination needs.    Review of patient status, including review of consultants reports, relevant laboratory and other test results, and collaboration with appropriate care team members and the patient's provider was performed as part of comprehensive patient evaluation and provision of chronic care management services.   Spoke with patient on the phone. Patient stated her sugars have been running a little high. Discussed with patient recent diet. Patient stated she has been drinking OJ with meals. Reviewed ADA/Carb modified diet choices. Patient verbalized understanding. Discussed COPD and patient stated "My breathing is doing really good right now."    Goals Addressed            This Visit's Progress   . "I need some help with my sugar" (pt-stated)       Current Barriers:  [name] Knowledge Deficits related to diabetes management  Nurse Case Manager Clinical Goal(s):  [name] Over the next 30 days, patient will verbalize understanding of plan for diabetes management . Over the next 30 days, patient will demonstrate improved health management independence as evidenced byperforming self health management activities such as checking cbg's/recording, working with CM team to address needs around chronic disease management  Interventions:  . Evaluation of current treatment plan related to diabetes management and patient's adherence to plan as established by provider. . Provided education to patient re: assistance being provided with medication management and importance of med adherence, self health  management activities (see patient education provided) . Reviewed medications with patient and discussed collaboration with CM Clinic pharmacist . Collaborated with Pharmacist  regarding medication management needs . Signed patient up to receive Glucerna coupons via her son's email with their permission. . Mailed Diabetes Diet education to patient as discussed.  . Reviewed diabetes diet information-Patient stating she drinks OJ with most meals discussed options . Patient has not received glucerna coupons will re-do application  Patient Self Care Activities:  . Currently UNABLE TO independently self manage diabetes . Performs ADL's independently . Calls provider office for new concerns or questions  Please see past updates related to this goal by clicking on the "Past Updates" button in the selected goal          The CM team will reach out to the patient again over the next 30 days.  The patient has been provided with contact information for the chronic care management team and has been advised to call with any health related questions or concerns.   Kendra [name] Kendra Winner RN, BSN Nurse Case Pharmacist, [facility]  [phone]) [employer]

## 2019-01-22 ENCOUNTER — Ambulatory Visit: Payer: Self-pay | Admitting: Pharmacist

## 2019-01-22 DIAGNOSIS — Z794 Long term (current) use of insulin: Secondary | ICD-10-CM

## 2019-01-22 DIAGNOSIS — E118 Type 2 diabetes mellitus with unspecified complications: Secondary | ICD-10-CM

## 2019-01-22 DIAGNOSIS — I10 Essential (primary) hypertension: Secondary | ICD-10-CM

## 2019-01-22 NOTE — Patient Instructions (Signed)
Thank you allowing the Chronic Care Management Team to be a part of your care! It was a pleasure speaking with you today!     CCM (Chronic Care Management) Team    [name] RN, BSN Nurse Care Coordinator  [phone]   [name] PharmD  Clinical Pharmacist  [phone]   [name] LCSW Clinical Social Worker [phone]  Visit Information  Goals Addressed            This Visit's Progress   . Medication Adherence       Current Barriers:  . Financial Barriers . Knowledge deficits related to coordination of her own care  Pharmacist Clinical Goal(s):  [name] Over the next 30 days, patient will demonstrate improved medication adherence as evidenced by verbalized understanding of prescribed medication regimen, assistance available, and patient report of adherence  Over the next 30 days, patient will demonstrate improved adherence to blood sugar checks as evidenced by telephonic blood sugar review  Interventions: . Counsel on medication adherence o Patient reports that she filled her own pillbox last week after we spoke.  - Reports that her son [name] will assist her with filling her pillbox moving forward, starting on Friday  Congratulate patient on checking her own blood pressure and blood sugar and keeping record.  Patient reports a symptomatic (tired and weak) low on Monday morning. Describes treating low as directed with quick-acting sugar source.  Provide additional counseling about low blood sugar management.  Review medications with patient- reports taking glipizide, metformin ER and Lantus as directed.  Will collaborate with [name] (covering provider for PCP) regarding patient's diabetes medication management.   Note that use of glipizide in combination with insulin may increase hypoglycemia risk . Discuss smoking cessation with patient. Patient expresses interest in quitting smoking, using nicotine patches to aid her. Note that Nicotine  patches have been sent by her Neurologist to her Pharmacy, but patches are not covered through her plan. o Remind patient to call [facility] [place] [phone]) for assistance with quitting o Note that [facility] can provide nicotine patches to patients for free.  Follow up with patient regarding appointment for placing Zio Patch for cardiac monitoring.  Reports that she missed her appointment with [facility] Neurology to have the [place] placed this morning. Asks for phone number to reschedule, provided to patient [phone])    Patient Self Care Activities:  . Currently able to manage medications with assistance of family members . Calls pharmacy for medication refills . Calls provider office for new concerns or questions  . Patient currently checks blood sugars twice daily and blood pressure daily and keeping log of each. Date Fasting Blood Glucose After Breakfast Bedtime Notes  3 - May  113    4 - May 68* 100 88 *Reports feeling tired/weak  5 - May 73 215    6 - May 80 117     Date Blood Pressure  2 - May 114/76, 86  3 - May 116/77, 61  4 - May 112/71, 73  5 - May 130/85, 64  6 - May 120/75, 89    Please see past updates related to this goal by clicking on the "Past Updates" button in the selected goal          The patient verbalized understanding of instructions provided today and declined a print copy of patient instruction materials.   The CM team will reach out to the patient again over the next 7 days.   [name],  PharmD, [place] [employer] [phone]

## 2019-01-22 NOTE — Chronic Care Management (AMB) (Signed)
Chronic Care Management   Follow Up Note   01/22/2019 Name: CARMA DWIGGINS MRN: [id number] DOB: Dec 16, 1945  Referred by: [name], NP Reason for referral : Chronic Care Management (Patient Phone Call)   JAELANI POSA is a 73 y.o. year old female who is a primary care patient of [name], NP. The CCM team was consulted for assistance with chronic disease management and care coordination needs. Ms. Hovsepian has a medical history which includes but is not limited to recent CVA, Type 2 Diabetes, hypertension, hyperlipidemia and COPD.  Receive a voicemail from Ms. Panepinto letting me know that she missed her appointment with [facility] Neurology for placement of Zio Patch monitoring device.  I reached out to [name] by phone today.   Review of patient status, including review of consultants reports, relevant laboratory and other test results, and collaboration with appropriate care team members and the patient's provider was performed as part of comprehensive patient evaluation and provision of chronic care management services.    Goals Addressed            This Visit's Progress   . Medication Adherence       Current Barriers:  . Financial Barriers . Knowledge deficits related to coordination of her own care  Pharmacist Clinical Goal(s):  [name] Over the next 30 days, patient will demonstrate improved medication adherence as evidenced by verbalized understanding of prescribed medication regimen, assistance available, and patient report of adherence  Over the next 30 days, patient will demonstrate improved adherence to blood sugar checks as evidenced by telephonic blood sugar review  Interventions: . Counsel on medication adherence o Patient reports that she filled her own pillbox last week after we spoke.  - Reports that her daughter-in-law, [name] did not follow through with helping her  - Reports that her son [name] will assist her with filling her  pillbox moving forward, starting on Friday  Congratulate patient on checking her own blood pressure and blood sugar and keeping record.  Patient reports a symptomatic (tired and weak) low on Monday morning. Describes treating low as directed with quick-acting sugar source.  Provide additional counseling about low blood sugar management.  Review medications with patient- reports taking glipizide, metformin ER and Lantus as directed.  Will collaborate with [name] (covering provider for PCP) regarding patient's diabetes medication management.   Note that use of glipizide in combination with insulin may increase hypoglycemia risk . Discuss smoking cessation with patient. Patient expresses interest in quitting smoking, using nicotine patches to aid her. Note that Nicotine patches have been sent by her Neurologist to her Pharmacy, but patches are not covered through her plan. o Remind patient to call [facility] [place] [phone]) for assistance with quitting o Note that [facility]uit[facility] can provide nicotine patches to patients for free.  Follow up with patient regarding appointment for placing Zio Patch for cardiac monitoring.  Reports that she missed her appointment with [facility] Neurology to have the [place] placed this morning. Asks for phone number to reschedule, provided to patient [phone])    Patient Self Care Activities:  . Currently able to manage medications with assistance of family members . Calls pharmacy for medication refills . Calls provider office for new concerns or questions  . Patient currently checks blood sugars twice daily and blood pressure daily and keeping log of each. Date Fasting Blood Glucose After Breakfast Bedtime Notes  3 - May  113    4 - May 68* 100 88 *  Reports feeling tired/weak  5 - May 73 215    6 - May 80 117     Date Blood Pressure  2 - May 114/76, 86  3 - May 116/77, 61  4 - May 112/71, 73  5 - May 130/85, 64  6 - May 120/75, 89    Please  see past updates related to this goal by clicking on the "Past Updates" button in the selected goal          Plan  Follow up with provider re: diabetes medication managment The CM team will reach out to the patient again over the next 7 days.    [name], PharmD, [place] [employer] [phone]

## 2019-01-23 ENCOUNTER — Ambulatory Visit (INDEPENDENT_AMBULATORY_CARE_PROVIDER_SITE_OTHER): Payer: Medicare HMO | Admitting: Licensed Clinical Social Worker

## 2019-01-23 ENCOUNTER — Other Ambulatory Visit: Payer: Self-pay | Admitting: Family Medicine

## 2019-01-23 ENCOUNTER — Other Ambulatory Visit: Payer: Self-pay

## 2019-01-23 DIAGNOSIS — E118 Type 2 diabetes mellitus with unspecified complications: Secondary | ICD-10-CM | POA: Diagnosis not present

## 2019-01-23 DIAGNOSIS — E1165 Type 2 diabetes mellitus with hyperglycemia: Secondary | ICD-10-CM

## 2019-01-23 DIAGNOSIS — J449 Chronic obstructive pulmonary disease, unspecified: Secondary | ICD-10-CM | POA: Diagnosis not present

## 2019-01-23 DIAGNOSIS — IMO0002 Reserved for concepts with insufficient information to code with codable children: Secondary | ICD-10-CM

## 2019-01-23 DIAGNOSIS — Z794 Long term (current) use of insulin: Secondary | ICD-10-CM

## 2019-01-23 DIAGNOSIS — I10 Essential (primary) hypertension: Secondary | ICD-10-CM | POA: Diagnosis not present

## 2019-01-23 MED ORDER — METFORMIN HCL ER 500 MG PO TB24: 500.0000 mg | ORAL_TABLET | Freq: Two times a day (BID) | ORAL | 1 refills | Status: DC

## 2019-01-23 NOTE — Progress Notes (Signed)
Updates from Kendra [name], [facility], copied from last communication regarding her last CCM note:   Kendra [name] has had significant improvement in her medication adherence over the past month as she is now able to afford all of her medication and is using a weekly pillbox again. She is now also checking her blood sugar and blood pressure regularly. With this improvement in adherence and data, patient has now reported two low blood sugars over the past month.   I am concerned that continuing to use glipizide in combination with insulin is increasing Kendra [name] hypoglycemia risk. Would you please consider discontinuing patient's glipizide and having her increase her metformin ER from 1 tablet once daily to 1 tablet twice daily. She has been back on her current metformin dose for a month now and while she did have GI side effects in the first weeks, she remained adherent with counseling and this resolved.   In collaboration with Kendra [name], I agree with these recommendations. I will discontinue Glipizide to avoid hypoglycemia risk. And increase Metformin ER from 1 tab daily to 1 tab BID. New orders signed  Kendra [name], [facility] 01/23/2019, 2:06 PM

## 2019-01-23 NOTE — Chronic Care Management (AMB) (Signed)
  Chronic Care Management    Clinical Social Work General Note  01/23/2019 Name: Kendra [name] MRN: [id number] DOB: [birth year]  Kendra [name] is a 73 y.o. year old female who is a primary care patient of [name], NP. The CCM was consulted to assist the patient with [employer].   Review of patient status, including review of consultants reports, relevant laboratory and other test results, and collaboration with appropriate care team members and the patient's provider was performed as part of comprehensive patient evaluation and provision of chronic care management services.    Goals Addressed    . "I need dental assistance" (pt-stated)       Current Barriers:  . Financial constraints . Limited social support . Social Isolation . Limited education about dental assistance resources*  Clinical Social Work Clinical Goal(s):  [name] Over the next 90 days, client will work with SW to address concerns related to gaining appropriate education on dental/denture assistance resources  Interventions: . Patient interviewed and appropriate assessments performed . Discussed plans with patient for ongoing care management follow up and provided patient with direct contact information for care management team . Collaborated with Care Guide (community agency) re: patient's needs . Assisted patient/caregiver with obtaining information about health plan benefits  Patient Self Care Activities:  . Attends all scheduled provider appointments . Calls provider office for new concerns or questions  Initial goal documentation   Follow Up Plan: SW will follow up with patient by phone over the next 30 days      [name], [place], MSW, [email or web address] Phone: [phone]

## 2019-01-24 ENCOUNTER — Other Ambulatory Visit: Payer: Self-pay

## 2019-01-24 ENCOUNTER — Ambulatory Visit: Payer: Self-pay | Admitting: Pharmacist

## 2019-01-24 DIAGNOSIS — Z794 Long term (current) use of insulin: Secondary | ICD-10-CM | POA: Diagnosis not present

## 2019-01-24 DIAGNOSIS — I6350 Cerebral infarction due to unspecified occlusion or stenosis of unspecified cerebral artery: Secondary | ICD-10-CM | POA: Diagnosis not present

## 2019-01-24 DIAGNOSIS — E118 Type 2 diabetes mellitus with unspecified complications: Secondary | ICD-10-CM | POA: Diagnosis not present

## 2019-01-24 DIAGNOSIS — I10 Essential (primary) hypertension: Secondary | ICD-10-CM | POA: Diagnosis not present

## 2019-01-24 DIAGNOSIS — J449 Chronic obstructive pulmonary disease, unspecified: Secondary | ICD-10-CM | POA: Diagnosis not present

## 2019-01-24 NOTE — Patient Instructions (Signed)
Thank you allowing the Chronic Care Management Team to be a part of your care! It was a pleasure speaking with you today!     CCM (Chronic Care Management) Team    [name] RN, BSN Nurse Care Coordinator  [phone]   [name] PharmD  Clinical Pharmacist  [phone]   [name] LCSW Clinical Social Worker [phone]  Visit Information  Goals Addressed            This Visit's Progress   . Medication Adherence       Current Barriers:  . Financial Barriers . Knowledge deficits related to coordination of her own care  Pharmacist Clinical Goal(s):  [name] Over the next 30 days, patient will demonstrate improved medication adherence as evidenced by verbalized understanding of prescribed medication regimen, assistance available, and patient report of adherence  Over the next 30 days, patient will demonstrate improved adherence to blood sugar checks as evidenced by telephonic blood sugar review  Interventions:  Collaborate with [name] (covering provider for PCP) regarding patient's diabetes medication management  Provider discontinues glipizide to avoid hypoglycemia risk. And increases metformin ER from 1 tab daily to 1 tab BID.  Counsel patient and son [name] on these changes. Patient verbalizes understanding via teach-back method that she is to stop glipizide and increase metformin ER 500 mg dose to 1 tablet twice daily.  Patient reports that she already picked up new metformin ER prescription from pharmacy.  Counsel patient about GI side effects with metformin dose increase - remind patient that this side effect should improve as she continues to take it and to take all doses with meals.  [name] on medication adherence o Patient confirms that she received the medication list, blood sugar log and blood pressure log that I mailed to her. - Advise patient to cross-out/note removal of glipizide from her medication list and to change the metformin  directions on this list to reflect the current twice daily dosing. - Patient's son confirms removal of glipizide from patient's pillbox and removes this pill bottle for the patient - Counsel patient and son [name] on how to fill patient's weekly pillbox, using patient's medication list as a tool to make sure that no medications are left off. [name] assists patient with filling weekly pillbox now while on the phone.  Collaboration with CM Nurse Case Manager - let patient know that RNCM has mailed her Glucerna coupons and that she is working on getting her Glucerna samples as well.  Reports that she called [facility] [place] [phone]) for assistance with quitting and has an appointment scheduled. o Note that [facility] can provide nicotine patches to patients for free.  Reports that she attended her appointment to have the Zio Patch placed this morning.  Confirms that she has the instructions and box for sending the patch in when she is done.   Patient Self Care Activities:  . Currently able to manage medications with assistance of family members . Calls pharmacy for medication refills . Calls provider office for new concerns or questions  . Patient currently checks blood sugars twice daily and blood pressure daily and keeping log of each.   Please see past updates related to this goal by clicking on the "Past Updates" button in the selected goal          The patient verbalized understanding of instructions provided today and declined a print copy of patient instruction materials.   The CM team will reach out to the patient again  over the next 7 days.   [name], PharmD, [place] [employer] [phone]

## 2019-01-24 NOTE — Chronic Care Management (AMB) (Signed)
Chronic Care Management   Follow Up Note   01/24/2019 Name: Kendra [name] MRN: [id number] DOB: 1946/08/20  Referred by: [name], NP Reason for referral : Chronic Care Management (Patient Phone Call)   Kendra [name] is a 73 y.o. year old female who is a primary care patient of [name], NP. The CCM team was consulted for assistance with chronic disease management and care coordination needs.  Kendra [name] has a medical history which includes but is not limited to recent CVA, Type 2 Diabetes, hypertension, hyperlipidemia and COPD.  I reached out to Kendra [name] by phone today regarding her diabetes medication management.  Review of patient status, including review of consultants reports, relevant laboratory and other test results, and collaboration with appropriate care team members and the patient's provider was performed as part of comprehensive patient evaluation and provision of chronic care management services.    Goals Addressed            This Visit's Progress   . Medication Adherence       Current Barriers:  . Financial Barriers . Knowledge deficits related to coordination of her own care  Pharmacist Clinical Goal(s):  [name] Over the next 30 days, patient will demonstrate improved medication adherence as evidenced by verbalized understanding of prescribed medication regimen, assistance available, and patient report of adherence  Over the next 30 days, patient will demonstrate improved adherence to blood sugar checks as evidenced by telephonic blood sugar review  Interventions:  Collaborate with Kendra [name] (covering provider for PCP) regarding patient's diabetes medication management  Provider discontinues glipizide to avoid hypoglycemia risk. And increases metformin ER from 1 tab daily to 1 tab BID.  Counsel patient and son Kendra [name] on these changes. Patient verbalizes understanding via teach-back method that she is to stop glipizide and  increase metformin ER 500 mg dose to 1 tablet twice daily.  Patient reports that she already picked up new metformin ER prescription from pharmacy.  Counsel patient about GI side effects with metformin dose increase - remind patient that this side effect should improve as she continues to take it and to take all doses with meals.  [name] on medication adherence o Patient confirms that she received the medication list, blood sugar log and blood pressure log that I mailed to her. - Advise patient to cross-out/note removal of glipizide from her medication list and to change the metformin directions on this list to reflect the current twice daily dosing. - Patient's son confirms removal of glipizide from patient's pillbox and removes this pill bottle for the patient - Counsel patient and son Kendra [name] on how to fill patient's weekly pillbox, using patient's medication list as a tool to make sure that no medications are left off. [name] assists patient with filling weekly pillbox now while on the phone.  Collaboration with CM Nurse Case Manager - let patient know that RNCM has mailed her Glucerna coupons and that she is working on getting her Glucerna samples as well.  Reports that she called [facility] [place] [phone]) for assistance with quitting and has an appointment scheduled. o Note that [facility] can provide nicotine patches to patients for free.  Reports that she attended her appointment to have the Zio Patch placed this morning.  Confirms that she has the instructions and box for sending the patch in when she is done.   Patient Self Care Activities:  . Currently able to manage medications with assistance of family members . Calls pharmacy  for medication refills . Calls provider office for new concerns or questions  . Patient currently checks blood sugars twice daily and blood pressure daily and keeping log of each.   Please see past updates related to this goal by clicking on the "Past  Updates" button in the selected goal          Plan   The CM team will reach out to the patient again over the next 7 days.   [name], PharmD, [place] [employer] [phone]

## 2019-01-30 ENCOUNTER — Other Ambulatory Visit: Payer: Self-pay

## 2019-01-30 ENCOUNTER — Ambulatory Visit: Payer: Self-pay | Admitting: Pharmacist

## 2019-01-30 DIAGNOSIS — E118 Type 2 diabetes mellitus with unspecified complications: Secondary | ICD-10-CM | POA: Diagnosis not present

## 2019-01-30 DIAGNOSIS — Z794 Long term (current) use of insulin: Secondary | ICD-10-CM | POA: Diagnosis not present

## 2019-01-30 DIAGNOSIS — I10 Essential (primary) hypertension: Secondary | ICD-10-CM | POA: Diagnosis not present

## 2019-01-30 DIAGNOSIS — J449 Chronic obstructive pulmonary disease, unspecified: Secondary | ICD-10-CM

## 2019-01-30 NOTE — Chronic Care Management (AMB) (Signed)
Chronic Care Management   Follow Up Note   01/30/2019 Name: Kendra [name] MRN: [id number] DOB: [birth year]  Referred by: Kendra College, NP Reason for referral : Chronic Care Management (Patient Phone Call)   Kendra [name] is a 73 y.o. year old female who is a primary care patient of Kendra College, NP. The CCM team was consulted for assistance with chronic disease management and care coordination needs.    Kendra [name] has a medical history which includes but is not limited to recent CVA, Type 2 Diabetes, hypertension, hyperlipidemia and COPD.  I reached out to Kendra [name] by phone today regarding her diabetes medication management.  Review of patient status, including review of consultants reports, relevant laboratory and other test results, and collaboration with appropriate care team members and the patient's provider was performed as part of comprehensive patient evaluation and provision of chronic care management services.    Goals Addressed            This Visit's Progress   . Medication Adherence       Current Barriers:  . Financial Barriers . Knowledge deficits related to coordination of her own care  Pharmacist Clinical Goal(s):  [name] Over the next 30 days, patient will demonstrate improved medication adherence as evidenced by verbalized understanding of prescribed medication regimen, assistance available, and patient report of adherence  Over the next 30 days, patient will demonstrate improved adherence to blood sugar checks as evidenced by telephonic blood sugar review  Interventions:  Inquire about patient's breathing and inhaler use  Patient reports a non-productive cough over the past week  Denies: fever, congestion, loss of taste/smell, chills, muscle pain, headache or current nausea,vomiting or diarrhea.  Reports using Trelegy inhaler once daily as directed for COPD. Reports using albuterol inhaler only as needed (Reports on average  once daily)  Reports improvement of cough over the past days.  Counsel patient to contact PCP if her cough worsens, becomes productive or if she experiences any new symptoms. Also provide patient with 24-hour nurse advice line [phone]) for patient to call if needed when clinic is not open.  Patient confirms that she has discontinued glipizide and has been taking increased dose of metformin ER 500 mg, 1 tablet twice daily as directed.  Reports GI symptoms over the past week including constipation, nausea and one episode of vomiting ([name]), but reports that these side effects have now resolved  Review with patient the medication list and blood sugar log that I mailed to her. - Patient reports crossing-out/noting removal of glipizide from her medication list and to change the metformin directions on this list to reflect the current twice daily dosing. - Have patient visually review her pillbox - patient denies any missed doses this week. - Confirms that her son Kendra [name] is assisting her with the weekly pillbox and that he will help her to refill it this evening when he gets home. - Counsel patient on the importance of monitoring her own blood sugar regularly and keeping log.  Counsel patient again on signs of low blood sugar and management of lows  Patient reports having one recent low blood sugar last week on Sunday afternoon, but that she had not eaten much of a breakfast.  Patient states that she checked her blood sugar and that it was 67. Reports that she felt tired and treated the low with ginger ale, but did not check blood sugar again in 15 minutes.  Discuss with patient the importance of  eating regular meals, particularly to avoid low blood sugars  Collaboration with CM Nurse Case Manager   Patient confirms that she received Glucerna coupons that [name] mailed  Patient Self Care Activities:  . Currently able to manage medications with assistance of family members . Calls  pharmacy for medication refills . Calls provider office for new concerns or questions  . Patient to check blood sugars twice daily and blood pressure daily and keeping log of each.  Date Fasting Blood Glucose After Breakfast Before Lunch Notes  10 - May   67* *Reports not having a sufficient breakfast *Had some ginger ale *Did not re-check blood sugar  11 - May - - -   12 - May 80     13 - May 84     14 - May  122     Date Blood Pressure  9 - May 120/81, HR79  10 - May 120/81, HR 58  11 - May 117/79, HR 67  12 - May 120/83, HR 61  13 - May 120/78, HR 73  14 - May 113/66, HR 63    Please see past updates related to this goal by clicking on the "Past Updates" button in the selected goal          Plan  The CM team will reach out to the patient again over the next 7 days.   [name], PharmD, [place] [employer] [phone]

## 2019-01-30 NOTE — Patient Instructions (Signed)
Thank you allowing the Chronic Care Management Team to be a part of your care! It was a pleasure speaking with you today!     CCM (Chronic Care Management) Team    [name] RN, BSN Nurse Care Coordinator  [phone]   Kendra [name] PharmD  Clinical Pharmacist  [phone]   [name] LCSW Clinical Social Worker [phone]  Visit Information  Goals Addressed            This Visit's Progress   . Medication Adherence       Current Barriers:  . Financial Barriers . Knowledge deficits related to coordination of her own care  Pharmacist Clinical Goal(s):  [name] Over the next 30 days, patient will demonstrate improved medication adherence as evidenced by verbalized understanding of prescribed medication regimen, assistance available, and patient report of adherence  Over the next 30 days, patient will demonstrate improved adherence to blood sugar checks as evidenced by telephonic blood sugar review  Interventions:  Inquire about patient's breathing and inhaler use  Patient reports a non-productive cough over the past week  Denies: fever, congestion, loss of taste/smell, chills, muscle pain, headache or current nausea,vomiting or diarrhea.  Reports using Trelegy inhaler once daily as directed for COPD. Reports using albuterol inhaler only as needed (Reports on average once daily)  Reports improvement of cough over the past days.  Counsel patient to contact PCP if her cough worsens, becomes productive or if she experiences any new symptoms. Also provide patient with 24-hour nurse advice line [phone]) for patient to call if needed when clinic is not open.  Patient confirms that she has discontinued glipizide and has been taking increased dose of metformin ER 500 mg, 1 tablet twice daily as directed.  Reports GI symptoms over the past week including constipation, nausea and one episode of vomiting ([name]), but reports that these side effects have  now resolved  Review with patient the medication list and blood sugar log that I mailed to her. - Patient reports crossing-out/noting removal of glipizide from her medication list and to change the metformin directions on this list to reflect the current twice daily dosing. - Have patient visually review her pillbox - patient denies any missed doses this week. - Confirms that her son [name] is assisting her with the weekly pillbox and that he will help her to refill it this evening when he gets home. - Counsel patient on the importance of monitoring her own blood sugar regularly and keeping log.  Counsel patient again on signs of low blood sugar and management of lows  Patient reports having one recent low blood sugar last week on Sunday afternoon, but that she had not eaten much of a breakfast.  Patient states that she checked her blood sugar and that it was 67. Reports that she felt tired and treated the low with ginger ale, but did not check blood sugar again in 15 minutes.  Discuss with patient the importance of eating regular meals, particularly to avoid low blood sugars  Collaboration with CM Nurse Case Manager   Patient confirms that she received Glucerna coupons that [facility] mailed  Patient Self Care Activities:  . Currently able to manage medications with assistance of family members . Calls pharmacy for medication refills . Calls provider office for new concerns or questions  . Patient to check blood sugars twice daily and blood pressure daily and keeping log of each.  Date Fasting Blood Glucose After Breakfast Before Lunch Notes  10 -  May   67* *Reports not having a sufficient breakfast *Had some ginger ale *Did not re-check blood sugar  11 - May - - -   12 - May 80     13 - May 84     14 - May  122     Date Blood Pressure  9 - May 120/81, HR79  10 - May 120/81, HR 58  11 - May 117/79, HR 67  12 - May 120/83, HR 61  13 - May 120/78, HR 73  14 - May 113/66, HR 63     Please see past updates related to this goal by clicking on the "Past Updates" button in the selected goal          The patient verbalized understanding of instructions provided today and declined a print copy of patient instruction materials.   The CM team will reach out to the patient again over the next 7 days.   Kendra [name], PharmD, [place] [employer] [phone]

## 2019-02-04 ENCOUNTER — Ambulatory Visit: Payer: Medicare HMO | Admitting: Pharmacist

## 2019-02-04 DIAGNOSIS — E118 Type 2 diabetes mellitus with unspecified complications: Secondary | ICD-10-CM

## 2019-02-04 DIAGNOSIS — Z794 Long term (current) use of insulin: Secondary | ICD-10-CM

## 2019-02-04 DIAGNOSIS — J449 Chronic obstructive pulmonary disease, unspecified: Secondary | ICD-10-CM | POA: Diagnosis not present

## 2019-02-04 DIAGNOSIS — I10 Essential (primary) hypertension: Secondary | ICD-10-CM

## 2019-02-04 NOTE — Patient Instructions (Signed)
Thank you allowing the Chronic Care Management Team to be a part of your care! It was a pleasure speaking with you today!     CCM (Chronic Care Management) Team    [name] RN, BSN Nurse Care Coordinator  [phone]   [name] PharmD  Clinical Pharmacist  [phone]   [name] LCSW Clinical Social Worker [phone]  Visit Information  Goals Addressed            This Visit's Progress   . Medication Adherence       Current Barriers:  . Financial Barriers . Knowledge deficits related to coordination of her own care  Pharmacist Clinical Goal(s):  [name] Over the next 30 days, patient will demonstrate improved medication adherence as evidenced by verbalized understanding of prescribed medication regimen, assistance available, and patient report of adherence  Over the next 30 days, patient will work with CM Pharmacist to optimize medication management.  Interventions:  Inquire about patient's breathing  Patient reports that her non-productive cough has improved  Denies: fever, congestion, new loss of taste/smell, chills, muscle pain, or nausea,vomiting or diarrhea.  Counsel patient to contact PCP if her cough worsens, becomes productive or if she experiences any new symptoms. Confirm patient has number for 24-hour nurse advice line  Medication adherence: - Have patient visually review her pillbox - patient denies any missed doses this week, except not yet having taken her medication for today - Reports that her son [name] helped her to fill her weekly pillbox last week, as [name] was out of town. - Patient reviews pillbox to confirm that she has not been taking glipizide as this was discontinued by her provider, but is taking metformin ER 500 mg BID - Also confirms currently taking her Lantus 24 units each night as directed  Counsel patient again on signs of low blood sugar and proper management of lows  Patient reports having a low blood sugar on  Friday.  Patient reports feeling tired this week with occasional headaches  Collaborate with [name] regarding patient's recent low blood sugars and her medication managment. Provider agrees with decreasing her Lantus dose to 22 units nightly  CM Pharmacist to follow up with patient regarding her blood sugar again in 7 days.   Patient advised to decrease her Lantus dose to 22 units nightly as directed by provider. Patient confirms understanding via teach-back method.  Patient Self Care Activities:  . Currently able to manage medications with assistance of family members . Calls pharmacy for medication refills . Calls provider office for new concerns or questions  . Patient checks blood sugars twice daily and blood pressure daily and keeping log of each. Date Fasting Blood Glucose After Breakfast Notes  15 - May 54* 75 *Treated with rapid-acting sugar, but not enough and then had breakfast  16 - May - 85   17 - May 82 98   18 - May 87 92   19 - May 86 98    Date Blood Pressure Notes  15 - May 105/72, HR 62   16 - May 118/75, HR 62   17 - May 145/90, HR 63   18 - May 124/77, HR 60   19 - May 151/95*, HR 60  *Had not taken BP medicine yet    Please see past updates related to this goal by clicking on the "Past Updates" button in the selected goal          The patient verbalized understanding of instructions  provided today and declined a print copy of patient instruction materials.   The CM team will reach out to the patient again over the next 7 days.   [name], PharmD, [place] [employer] [phone]

## 2019-02-04 NOTE — Chronic Care Management (AMB) (Signed)
Chronic Care Management   Follow Up Note   02/04/2019 Name: Kendra [name] MRN: [id number] DOB: 1945-09-28  Referred by: Kendra College, NP Reason for referral : Chronic Care Management (Patient Phone Call)   Kendra [name] is a 73 y.o. year old female who is a primary care patient of Kendra College, NP. The CCM team was consulted for assistance with chronic disease management and care coordination needs.  Kendra [name] has a medical history which includes but is not limited to recent CVA, Type 2 Diabetes, hypertension, hyperlipidemia and COPD.  I reached out to Kendra [name] by phone today regarding her diabetes medication management and medication adherence.  Review of patient status, including review of consultants reports, relevant laboratory and other test results, and collaboration with appropriate care team members and the patient's provider was performed as part of comprehensive patient evaluation and provision of chronic care management services.    Medications Reviewed Today    Reviewed by Kendra [name], Kendra [name] ([name]) on 12/24/18 at [place] List Status: <None>  Medication Order Taking? Sig Documenting Provider Last Dose Status Informant  acetaminophen (TYLENOL) 500 MG tablet [id number]  Take 2 tablets (1,000 mg total) by mouth every 8 (eight) hours as needed for mild pain or moderate pain. Kendra College, NP  Active   albuterol (VENTOLIN HFA) 108 (90 Base) MCG/ACT inhaler [id number]  INHALE 1 TO 2 PUFFS INTO THE LUNGS EVERY 6 HOURS AS NEEDED FOR WHEEZING OR SHORTNESS OF BREATH Kendra College, NP  Active   aspirin 81 MG EC tablet [id number]  Take 1 tablet (81 mg total) by mouth daily. Swallow whole. Kendra College, NP  Active   atorvastatin (LIPITOR) 40 MG tablet [id number]  Take 1 tablet (40 mg total) by mouth daily with supper. Kendra College, NP  Active   baclofen (LIORESAL) 10 MG tablet [id number]  Take 1 tablet (10 mg  total) by mouth daily as needed for muscle spasms. Kendra College, NP  Active   Blood Glucose Monitoring Suppl [facility] VERIO) w/Device KIT [id number]  1 kit by Does not apply route 2 (two) times daily. Kendra College, NP  Active   Cholecalciferol (VITAMIN D3) 50 MCG (2000 UT) capsule [id number]  Take 1 capsule (2,000 Units total) by mouth daily.  Patient not taking:  Reported on 12/23/2018   Kendra College, NP  Active   citalopram (CELEXA) 40 MG tablet [id number]  Take 1 tablet (40 mg total) by mouth daily. Kendra College, NP  Active   Fluticasone-Umeclidin-Vilant (TRELEGY ELLIPTA) 100-62.5-25 MCG/INH AEPB [id number]  Inhale 1 puff into the lungs daily.  Patient not taking:  Reported on 12/23/2018   Kendra College, NP  Active   glipiZIDE (GLUCOTROL) 5 MG tablet [id number]  Take 1 tablet (5 mg total) by mouth daily before breakfast. Kendra College, NP  Active   glucose blood [facility] VERIO) test strip [id number]  Use as instructed Kendra College, NP  Active   Insulin Glargine (LANTUS SOLOSTAR) 100 UNIT/ML Solostar Pen [id number]  INJECT  24 UNITS SUBCUTANEOUSLY EVERY DAY  AT  10PM Kendra College, NP  Active   Insulin Pen Needle (BD PEN NEEDLE NANO U/F) 32G X 4 MM MISC [id number]  USE WITH LANTUS AS DIRECTED Kendra College, NP  Active   Lancet Devices (ONE TOUCH DELICA LANCING DEV) MISC [id number]  1 Device by Does not apply route 2 (two) times daily. Kendra College, NP  Active   lisinopril (PRINIVIL,ZESTRIL) 2.5 MG tablet [id number]  Take 1 tablet (2.5 mg total) by mouth daily. Kendra College, NP  Active   meloxicam [facility]) 15 MG tablet [id number]  Take 1 tablet (15 mg total) by mouth daily as needed (for arthritis pain). Kendra College, NP  Active   metFORMIN (GLUCOPHAGE-XR) 500 MG 24 hr tablet [id number]  Take 1 tablet (500 mg total) by mouth daily with breakfast. Kendra College, NP  Active   [facility] Lancets 08M  MISC [id number]  1 Device by Does not apply route 2 (two) times daily. Kendra College, NP  Active            Goals Addressed            This Visit's Progress   . Medication Adherence       Current Barriers:  . Financial Barriers . Knowledge deficits related to coordination of her own care  [name] Clinical Goal(s):  [name] Over the next 30 days, patient will demonstrate improved medication adherence as evidenced by verbalized understanding of prescribed medication regimen, assistance available, and patient report of adherence  Over the next 30 days, patient will work with Kendra [name] to optimize medication management.  Interventions:  Inquire about patient's breathing  Patient reports that her non-productive cough has improved  Denies: fever, congestion, new loss of taste/smell, chills, muscle pain, or nausea,vomiting or diarrhea.  Counsel patient to contact PCP if her cough worsens, becomes productive or if she experiences any new symptoms. Confirm patient has number for 24-hour nurse advice line  Medication adherence: - Have patient visually review her pillbox - patient denies any missed doses this week, except not yet having taken her medication for today - Reports that her son Kendra [name] helped her to fill her weekly pillbox last week, as Kendra [name] was out of town. - Patient reviews pillbox to confirm that she has not been taking glipizide as this was discontinued by her provider, but is taking metformin ER 500 mg BID - Also confirms currently taking her Lantus 24 units each night as directed  Counsel patient again on signs of low blood sugar and proper management of lows  Patient reports having a low blood sugar on Friday.  Patient reports feeling tired this week with occasional headaches  Collaborate with Kendra [name] regarding patient's recent low blood sugars and her medication managment. Provider agrees with decreasing her Lantus dose from 24 units to 22 units  nightly  Kendra [name] to follow up with patient regarding her blood sugar again in 7 days.   Patient advised to decrease her Lantus dose to 22 units nightly as directed by provider. Patient confirms understanding via teach-back method.  Patient Self Care Activities:  . Currently able to manage medications with assistance of family members . Calls pharmacy for medication refills . Calls provider office for new concerns or questions  . Patient checks blood sugars twice daily and blood pressure daily and keeping log of each. Date Fasting Blood Glucose After Breakfast Notes  15 - May 54* 75 *Treated with rapid-acting sugar, but not enough and then had breakfast  16 - May - 85   17 - May 82 98   18 - May 87 92   19 - May 86 98    Date Blood Pressure Notes  15 - May 105/72, HR 62   16 - May 118/75, HR 62   17 - May 145/90, HR 63   18 - May 124/77,  HR 60   19 - May 151/95*, HR 60  *Had not taken BP medicine yet    Please see past updates related to this goal by clicking on the "Past Updates" button in the selected goal          Plan  Telephone follow up appointment with CCM team member scheduled for: 02/11/2019  [name], PharmD, [place] [phone]

## 2019-02-06 ENCOUNTER — Other Ambulatory Visit: Payer: Self-pay

## 2019-02-06 ENCOUNTER — Ambulatory Visit: Payer: Self-pay | Admitting: Licensed Clinical Social Worker

## 2019-02-06 DIAGNOSIS — E118 Type 2 diabetes mellitus with unspecified complications: Secondary | ICD-10-CM

## 2019-02-06 DIAGNOSIS — Z794 Long term (current) use of insulin: Secondary | ICD-10-CM | POA: Diagnosis not present

## 2019-02-06 DIAGNOSIS — J449 Chronic obstructive pulmonary disease, unspecified: Secondary | ICD-10-CM | POA: Diagnosis not present

## 2019-02-06 DIAGNOSIS — I10 Essential (primary) hypertension: Secondary | ICD-10-CM | POA: Diagnosis not present

## 2019-02-06 NOTE — Chronic Care Management (AMB) (Signed)
  Chronic Care Management    Clinical Social Work General Note  02/06/2019 Name: DENITRA DONAGHEY MRN: [id number] DOB: 08/18/1946  Tresa Moore Lourenco is a 73 y.o. year old female who is a primary care patient of [name], NP. The CCM was consulted to assist the patient with [employer].   Review of patient status, including review of consultants reports, relevant laboratory and other test results, and collaboration with appropriate care team members and the patient's provider was performed as part of comprehensive patient evaluation and provision of chronic care management services.    Goals Addressed    . "I need dental assistance" (pt-stated)       Current Barriers:  . Financial constraints . Limited social support . Social Isolation . Limited education about dental assistance resources*  Clinical Social Work Clinical Goal(s):  [name] Over the next 90 days, client will work with SW to address concerns related to gaining appropriate education on dental/denture assistance resources  Interventions: . Patient interviewed and appropriate assessments performed . Provided patient with information about available dental support resources within [place] . Discussed plans with patient for ongoing care management follow up and provided patient with direct contact information for care management team . Advised patient to review affordable denture/dental resources that LCSW sent to her son . Collaborated with patient's son re: emailing dental resources to him instead of mailing them to patient's residence to ensure delivery. Brief dental resource education provided to family as well. . Assisted patient/caregiver with obtaining information about health plan benefits  Patient Self Care Activities:  . Attends all scheduled provider appointments . Calls provider office for new concerns or questions  Please see past updates related to this goal by clicking on the "Past Updates" button in the  selected goal    Follow Up Plan: SW will follow up with patient by phone over the next 30 days      [name], [place], MSW, [email or web address] Phone: [phone]

## 2019-02-11 ENCOUNTER — Ambulatory Visit: Payer: Self-pay | Admitting: Pharmacist

## 2019-02-11 DIAGNOSIS — J449 Chronic obstructive pulmonary disease, unspecified: Secondary | ICD-10-CM | POA: Diagnosis not present

## 2019-02-11 DIAGNOSIS — I10 Essential (primary) hypertension: Secondary | ICD-10-CM | POA: Diagnosis not present

## 2019-02-11 DIAGNOSIS — E118 Type 2 diabetes mellitus with unspecified complications: Secondary | ICD-10-CM | POA: Diagnosis not present

## 2019-02-11 DIAGNOSIS — Z794 Long term (current) use of insulin: Secondary | ICD-10-CM | POA: Diagnosis not present

## 2019-02-11 NOTE — Patient Instructions (Signed)
Thank you allowing the Chronic Care Management Team to be a part of your care! It was a pleasure speaking with you today!     CCM (Chronic Care Management) Team    [name] RN, BSN Nurse Care Coordinator  [phone]   [name] PharmD  Clinical Pharmacist  [phone]   [name] LCSW Clinical Social Worker [phone]  Visit Information  Goals Addressed            This Visit's Progress   . Medication Adherence       Current Barriers:  . Financial Barriers . Knowledge deficits related to coordination of her own care  Pharmacist Clinical Goal(s):  [name] Over the next 30 days, patient will demonstrate improved medication adherence as evidenced by verbalized understanding of prescribed medication regimen, assistance available, and patient report of adherence  Over the next 30 days, patient will work with CM Pharmacist to optimize medication management.  Interventions:  Inquire about patient's breathing  Patient reports that her non-productive cough is about the same  Denies: fever, congestion, new loss of taste/smell, chills, muscle pain, or nausea,vomiting or diarrhea.  Counsel patient to contact PCP if her cough worsens, becomes productive or if she experiences any new symptoms. Confirm patient has number for 24-hour nurse advice line  Reports that she forgot to use her Trelegy inhaler everyday. Counsel patient again about the importance of using this inhaler once daily as directed and rinsing mouth out after each use and using albuterol inhaler as needed  Medication adherence: - Have patient visually review her pillbox - patient denies any missed doses this week, except her evening metformin last night - Reports that her son helped her to fill her weekly pillbox - Confirms currently taking her Lantus 22 units each night as directed. Denies missed doses  Patient reports that she has been feeling weaker in her legs ever since her discharge from [facility] PT.  Collaborate with CM Nurse Case Manager regarding patient's leg weakness  Collaborate with [name] regarding patient's reported blood sugars and her medication managment. Provider agrees with decreasing her Lantus dose from 22 units to 20 units nightly  CM Pharmacist to follow up with patient regarding her blood sugar again in 7 days.   Patient advised to decrease her Lantus dose to 20 units nightly as directed by provider. Patient confirms understanding via teach-back method.  Counsel patient again on signs of low blood sugar and proper management of lows  Inquire about Zio Patch follow up - Ms. Hinson confirms that she mailed Zio Patch back to Neurology as directed  Patient Self Care Activities:  . Currently able to manage medications with assistance of family members . Calls pharmacy for medication refills . Calls provider office for new concerns or questions  . Patient checks blood sugars twice daily and blood pressure daily and keeping log of each.  Date Fasting Blood Glucose After Breakfast Before Lunch Notes  20 - May 99     21 - May 80 113    22 - May 67 101 66   23 - May 70 80    24 - May -     25 - May 86 98    26 - May 157*   *Large supper night before; missed evening metformin dose  Average 93 98     Date AM Blood Pressure PM Blood Pressure  21- May 144/92, HR 61 114/76, HR 72  22- May 131/82, HR 71 117/71,  HR 66  23 - May  114/76, HR 61  24 - May 140/76, HR 61 127/83, HR 69  25 - May - -  26 - May 120/80, HR 80    Please see past updates related to this goal by clicking on the "Past Updates" button in the selected goal          The patient verbalized understanding of instructions provided today and declined a print copy of patient instruction materials.   The CM team will reach out to the patient again over the next 7 days.   [name], PharmD, [place] [employer] [phone]

## 2019-02-11 NOTE — Chronic Care Management (AMB) (Signed)
Chronic Care Management   Follow Up Note   02/11/2019 Name: Kendra [name] MRN: [id number] DOB: 04/15/1946  Referred by: [name], NP Reason for referral : Chronic Care Management (Patient Phone Call)   JEWELIANNA [name] is a 73 y.o. year old female who is a primary care patient of [name], NP. The CCM team was consulted for assistance with chronic disease management and care coordination needs.  Kendra [name] has a medical history which includes but is not limited to recent CVA, Type 2 Diabetes, hypertension, hyperlipidemia and COPD.  I reached out to [name] by phone today regarding her diabetes medication management and medication adherence.  Review of patient status, including review of consultants reports, relevant laboratory and other test results, and collaboration with appropriate care team members and the patient's provider was performed as part of comprehensive patient evaluation and provision of chronic care management services.    Goals Addressed            This Visit's Progress   . Medication Adherence       Current Barriers:  . Financial Barriers . Knowledge deficits related to coordination of her own care  Pharmacist Clinical Goal(s):  [name] Over the next 30 days, patient will demonstrate improved medication adherence as evidenced by verbalized understanding of prescribed medication regimen, assistance available, and patient report of adherence  Over the next 30 days, patient will work with CM Pharmacist to optimize medication management.  Interventions:  Inquire about patient's breathing  Patient reports that her non-productive cough is about the same  Denies: fever, congestion, new loss of taste/smell, chills, muscle pain, or nausea,vomiting or diarrhea.  Counsel patient to contact PCP if her cough worsens, becomes productive or if she experiences any new symptoms. Confirm patient has number for 24-hour nurse advice line   Reports that she forgot to use her Trelegy inhaler everyday. Counsel patient again about the importance of using this inhaler once daily as directed and rinsing mouth out after each use and using albuterol inhaler as needed  Medication adherence: - Have patient visually review her pillbox - patient denies any missed doses this week, except her evening metformin last night - Reports that her son helped her to fill her weekly pillbox - Confirms currently taking her Lantus 22 units each night as directed. Denies missed doses  Patient reports that she has been feeling weaker in her legs ever since her discharge from [name] PT.  Collaborate with CM Nurse Case Manager regarding patient's leg weakness  Collaborate with [name] regarding patient's reported blood sugars and her medication managment. Provider agrees with decreasing her Lantus dose from 22 units to 20 units nightly  CM Pharmacist to follow up with patient regarding her blood sugar again in 7 days.   Patient advised to decrease her Lantus dose to 20 units nightly as directed by provider. Patient confirms understanding via teach-back method.  Counsel patient again on signs of low blood sugar and proper management of lows  Inquire about Zio Patch follow up - [name] confirms that she mailed Zio Patch back to Neurology as directed  Patient Self Care Activities:  . Currently able to manage medications with assistance of family members . Calls pharmacy for medication refills . Calls provider office for new concerns or questions  . Patient checks blood sugars twice daily and blood pressure daily and keeping log of each.  Date Fasting Blood Glucose After Breakfast Before Lunch Notes  20 -  May 99     21 - May 80 113    22 - May 67 101 66   23 - May 70 80    24 - May -     25 - May 86 98    26 - May 157*   *Large supper night before; missed evening metformin dose  Average 93 98     Date AM Blood Pressure PM  Blood Pressure  21- May 144/92, HR 61 114/76, HR 72  22- May 131/82, HR 71 117/71, HR 66  23 - May  114/76, HR 61  24 - May 140/76, HR 61 127/83, HR 69  25 - May - -  26 - May 120/80, HR 80    Please see past updates related to this goal by clicking on the "Past Updates" button in the selected goal          Plan  CM Pharmacist will reach out to the patient again over the next 7 days.  CM Nurse Case Manager will reach out to the patient again over the next 7 days regarding leg weakness  [name], PharmD, [place] [phone]

## 2019-02-17 ENCOUNTER — Encounter: Payer: Self-pay | Admitting: Family Medicine

## 2019-02-17 ENCOUNTER — Ambulatory Visit (INDEPENDENT_AMBULATORY_CARE_PROVIDER_SITE_OTHER): Payer: Medicare HMO | Admitting: Family Medicine

## 2019-02-17 ENCOUNTER — Telehealth: Payer: Self-pay

## 2019-02-17 ENCOUNTER — Other Ambulatory Visit: Payer: Self-pay

## 2019-02-17 DIAGNOSIS — E118 Type 2 diabetes mellitus with unspecified complications: Secondary | ICD-10-CM

## 2019-02-17 DIAGNOSIS — Z794 Long term (current) use of insulin: Secondary | ICD-10-CM

## 2019-02-17 NOTE — Progress Notes (Signed)
Virtual Visit via Telephone The purpose of this virtual visit is to provide medical care while limiting exposure to the novel coronavirus (COVID19) for both patient and office staff.  Consent was obtained for phone visit:  Yes.   Answered questions that patient had about telehealth interaction:  Yes.   I discussed the limitations, risks, security and privacy concerns of performing an evaluation and management service by telephone. I also discussed with the patient that there may be a patient responsible charge related to this service. The patient expressed understanding and agreed to proceed.  Patient Location: Home Provider Location: [facility])  ---------------------------------------------------------------------- Chief Complaint  Patient presents with  . Medication Problem    she is not sure but thinks celexa made her throw up and nausea     S: Reviewed CMA documentation. I have called patient and gathered additional HPI as follows:  Bilateral Weakness / Type 2 Diabetes - Off Celexa, thought side effect - No more HH PT - but still doing home exercises - Reducing Lantus daily from 20 units daily - History of CVA 11/2018, still some residual weakness Denies any new concerns   Denies any high risk travel to areas of current concern for COVID19. Denies any known or suspected exposure to person with or possibly with COVID19.  Denies any fevers, chills, sweats, body ache, cough, shortness of breath, sinus pain or pressure, headache, abdominal pain, diarrhea  Past Medical History:  Diagnosis Date  . Back pain   . Cataract   . Cervical disc disorder   . Chest pain   . COPD (chronic obstructive pulmonary disease) ([place])   . Depression   . Diabetes ([place])   . Diabetic neuropathy ([place])   . GERD (gastroesophageal reflux disease)   . Hyperlipidemia   . Insomnia   . Neuropathy   . Osteoarthritis   . Osteopenia   . Reflux   . Rheumatoid arthritis ([place])   .  Sleep apnea    no CPAP and no suggestion that she needed one per pt  . Stroke ([place]) 2015   and again 2017  - Weakness in left leg, staggers w/ walking  . Tenosynovitis    Social History   Tobacco Use  . Smoking status: Current Some Day Smoker    Packs/day: 0.25    Types: Cigarettes  . Smokeless tobacco: Never Used  Substance Use Topics  . Alcohol use: No  . Drug use: No    Current Outpatient Medications:  .  acetaminophen (TYLENOL) 500 MG tablet, Take 2 tablets (1,000 mg total) by mouth every 8 (eight) hours as needed for mild pain or moderate pain., Disp: 270 tablet, Rfl: 1 .  albuterol (VENTOLIN HFA) 108 (90 Base) MCG/ACT inhaler, INHALE 1 TO 2 PUFFS INTO THE LUNGS EVERY 6 HOURS AS NEEDED FOR WHEEZING OR SHORTNESS OF BREATH, Disp: 18 g, Rfl: 2 .  aspirin 81 MG EC tablet, Take 1 tablet (81 mg total) by mouth daily. Swallow whole., Disp: 90 tablet, Rfl: 3 .  atorvastatin (LIPITOR) 40 MG tablet, Take 1 tablet (40 mg total) by mouth daily with supper., Disp: 90 tablet, Rfl: 3 .  baclofen (LIORESAL) 10 MG tablet, Take 1 tablet (10 mg total) by mouth daily as needed for muscle spasms., Disp: 90 each, Rfl: 1 .  bisacodyl (DULCOLAX) 5 MG EC tablet, Take 5 mg by mouth daily as needed for moderate constipation., Disp: , Rfl:  .  Blood Glucose Monitoring Suppl (ONETOUCH VERIO) w/Device KIT, 1  kit by Does not apply route 2 (two) times daily., Disp: 1 kit, Rfl: 0 .  Cholecalciferol (VITAMIN D3) 50 MCG (2000 UT) capsule, Take 1 capsule (2,000 Units total) by mouth daily., Disp: 90 capsule, Rfl: 1 .  Fluticasone-Umeclidin-Vilant (TRELEGY ELLIPTA) 100-62.5-25 MCG/INH AEPB, Inhale 1 puff into the lungs daily., Disp: 90 each, Rfl: 3 .  glucose blood (ONETOUCH VERIO) test strip, Use as instructed, Disp: 200 each, Rfl: 4 .  Insulin Glargine (LANTUS SOLOSTAR) 100 UNIT/ML Solostar Pen, INJECT  24 UNITS SUBCUTANEOUSLY EVERY DAY  AT  10PM (Patient taking differently: Inject 20 Units into the skin at bedtime.  ), Disp: 8 pen, Rfl: 3 .  Insulin Pen Needle (BD PEN NEEDLE NANO U/F) 32G X 4 MM MISC, USE WITH LANTUS AS DIRECTED, Disp: 90 each, Rfl: 3 .  Lancet Devices (ONE TOUCH DELICA LANCING DEV) MISC, 1 Device by Does not apply route 2 (two) times daily., Disp: 1 each, Rfl: 0 .  lisinopril (PRINIVIL,ZESTRIL) 2.5 MG tablet, Take 1 tablet (2.5 mg total) by mouth daily., Disp: 90 tablet, Rfl: 3 .  meloxicam (MOBIC) 15 MG tablet, Take 1 tablet (15 mg total) by mouth daily as needed (for arthritis pain)., Disp: 90 tablet, Rfl: 1 .  metFORMIN (GLUCOPHAGE-XR) 500 MG 24 hr tablet, Take 1 tablet (500 mg total) by mouth 2 (two) times daily with a meal., Disp: 180 tablet, Rfl: 1 .  OneTouch Delica Lancets 35T MISC, 1 Device by Does not apply route 2 (two) times daily., Disp: 200 each, Rfl: 4 .  nicotine (NICODERM CQ - DOSED IN MG/24 HOURS) 14 mg/24hr patch, Place onto the skin., Disp: , Rfl:   Depression screen [facility] 2/9 05/07/2018 01/04/2018 06/08/2017  Decreased Interest 2 0 0  Down, Depressed, Hopeless '3 2 3  '$ PHQ - 2 Score '5 2 3  '$ Altered sleeping '3 2 3  '$ Tired, decreased energy '3 3 3  '$ Change in appetite 0 0 1  Feeling bad or failure about yourself  2 0 2  Trouble concentrating 3 0 2  Moving slowly or fidgety/restless 3 0 1  Suicidal thoughts 0 0 0  PHQ-9 Score '19 7 15  '$ Difficult doing work/chores Not difficult at all Not difficult at all Somewhat difficult    GAD 7 : Generalized Anxiety Score 02/17/2019  Nervous, Anxious, on Edge (No Data)    -------------------------------------------------------------------------- O: No physical exam performed due to remote telephone encounter.  Lab results reviewed.  Recent Labs    05/07/18 0835 09/30/18 1113  HGBA1C 6.7* 7.8*    No results found for this or any previous visit (from the past 2160 hour(s)).  -------------------------------------------------------------------------- A&P:  Problem List Items Addressed This Visit    Diabetes mellitus type 2,  controlled, with complications ([place]) - Primary     Elevated A1c to 7.8 With hyperglycemia No hypoglycemia Complications with CVA 03/3219  Plan - Emphasis on diet lifestyle to help control A1c back down to range - Considered SGLT2 vs ID  No orders of the defined types were placed in this encounter.   Follow-up: - Return in 3 months for DM A1c, PCP  Patient verbalizes understanding with the above medical recommendations including the limitation of remote medical advice.  Specific follow-up and call-back criteria were given for patient to follow-up or seek medical care more urgently if needed.  Nobie Putnam, [facility] 02/17/2019, 11:14 AM

## 2019-02-17 NOTE — Patient Instructions (Addendum)
No Access to MyChart. AVS given by phone.

## 2019-02-18 ENCOUNTER — Ambulatory Visit: Payer: Medicare HMO | Admitting: Pharmacist

## 2019-02-18 ENCOUNTER — Encounter: Payer: Self-pay | Admitting: Family Medicine

## 2019-02-18 DIAGNOSIS — J449 Chronic obstructive pulmonary disease, unspecified: Secondary | ICD-10-CM

## 2019-02-18 DIAGNOSIS — Z8673 Personal history of transient ischemic attack (TIA), and cerebral infarction without residual deficits: Secondary | ICD-10-CM | POA: Diagnosis not present

## 2019-02-18 DIAGNOSIS — I472 Ventricular tachycardia: Secondary | ICD-10-CM | POA: Diagnosis not present

## 2019-02-18 DIAGNOSIS — Z794 Long term (current) use of insulin: Secondary | ICD-10-CM

## 2019-02-18 DIAGNOSIS — E118 Type 2 diabetes mellitus with unspecified complications: Secondary | ICD-10-CM

## 2019-02-18 DIAGNOSIS — I471 Supraventricular tachycardia: Secondary | ICD-10-CM | POA: Diagnosis not present

## 2019-02-18 NOTE — Chronic Care Management (AMB) (Signed)
  Chronic Care Management   Follow Up Note   02/18/2019 Name: Kendra [name] MRN: [id number] DOB: 04-May-1946  Referred by: Kendra College, NP Reason for referral : Chronic Care Management (Patient Phone Call)   Kendra [name] is a 73 y.o. year old female who is a primary care patient of Kendra College, NP. The CCM team was consulted for assistance with chronic disease management and care coordination needs.  Kendra [name] has a medical history which includes but is not limited to recent CVA, Type 2 Diabetes, hypertension, hyperlipidemia and COPD.  I reached out to Kendra [name] by phone today regarding her diabetes medication management and medication adherence.  Review of patient status, including review of consultants reports, relevant laboratory and other test results, and collaboration with appropriate care team members and the patient's provider was performed as part of comprehensive patient evaluation and provision of chronic care management services.    Goals Addressed            This Visit's Progress   . Medication Adherence       Current Barriers:  . Financial Barriers . Knowledge deficits related to coordination of her own care  Pharmacist Clinical Goal(s):  Kendra Kitchen Over the next 30 days, patient will demonstrate improved medication adherence as evidenced by verbalized understanding of prescribed medication regimen, assistance available, and patient report of adherence  Over the next 30 days, patient will work with CM Pharmacist to optimize medication management.  Interventions:  Medication adherence: - Have patient visually review her pillbox - patient denies any missed doses this week - Reports that her son helped her to fill her weekly pillbox - Confirms currently taking her Lantus 20 units each night and metformin as directed. Denies missed doses - Reports using Trelegy inhaler once daily as directed and rinsing mouth out after each use  Have  collaborated with CM Nurse Case Manager regarding patient's leg weakness - patient to discuss with RNCM during upcoming appointment  Counsel patient about the importance of eating regular meals, particularly breakfast  Patient denies eating breakfast over the past week. Counsel patient on the importance of eating breakfast.  Will collaborate with CM Nurse Case Manager  Inquire about follow up with Kendra [name] for smoking cessation. [name] says that she needs to follow up with the [name] and requests phone number, which I provide, [phone]  Patient Self Care Activities:  . Currently able to manage medications with assistance of sons, Kendra [name] and Kendra [name] . Calls pharmacy for medication refills o Patient to call pharmacy for refill of Ventolin inhaler . Calls provider office for new concerns or questions  . Patient to check blood sugars twice daily and blood pressure daily and keeping log of each. Date Fasting Blood Glucose  28 - May 94  29 - May 96  30 - May 79  31 - May 88  1 - June 72  2 - June -   *Patient reports that she has not been eating breakfast this week.   *Not checking blood sugar twice daily  Please see past updates related to this goal by clicking on the "Past Updates" button in the selected goal          Plan  The care management team will reach out to the patient again over the next 7 days.   [name], PharmD, [place] [employer] [phone]

## 2019-02-18 NOTE — Patient Instructions (Signed)
Thank you allowing the Chronic Care Management Team to be a part of your care! It was a pleasure speaking with you today!     CCM (Chronic Care Management) Team    [name] RN, BSN Nurse Care Coordinator  [phone]   [name] PharmD  Clinical Pharmacist  [phone]   [name] LCSW Clinical Social Worker [phone]  Visit Information  Goals Addressed            This Visit's Progress   . Medication Adherence       Current Barriers:  . Financial Barriers . Knowledge deficits related to coordination of her own care  Pharmacist Clinical Goal(s):  [name] Over the next 30 days, patient will demonstrate improved medication adherence as evidenced by verbalized understanding of prescribed medication regimen, assistance available, and patient report of adherence  Over the next 30 days, patient will work with CM Pharmacist to optimize medication management.  Interventions:  Medication adherence: - Have patient visually review her pillbox - patient denies any missed doses this week - Reports that her son helped her to fill her weekly pillbox - Confirms currently taking her Lantus 20 units each night and metformin as directed. Denies missed doses - Reports using Trelegy inhaler once daily as directed and rinsing mouth out after each use  Have collaborated with CM Nurse Case Manager regarding patient's leg weakness - patient to discuss with RNCM during upcoming appointment  Counsel patient about the importance of eating regular meals, particularly breakfast  Patient denies eating breakfast over the past week. Counsel patient on the importance of eating breakfast.  Will collaborate with CM Nurse Case Manager  Inquire about follow up with [facility] for smoking cessation. Ms. Buffkin says that she needs to follow up with the [facility] and requests phone number, which I provide, [phone]  Patient Self Care Activities:  . Currently able to manage  medications with assistance of sons, [name] and [name] . Calls pharmacy for medication refills o Patient to call pharmacy for refill of Ventolin inhaler . Calls provider office for new concerns or questions  . Patient to check blood sugars twice daily and blood pressure daily and keeping log of each. Date Fasting Blood Glucose  28 - May 94  29 - May 96  30 - May 79  31 - May 88  1 - June 72  2 - June -   *Patient reports that she has not been eating breakfast this week.   *Not checking blood sugar twice daily  Please see past updates related to this goal by clicking on the "Past Updates" button in the selected goal          The patient verbalized understanding of instructions provided today and declined a print copy of patient instruction materials.   The care management team will reach out to the patient again over the next 7 days.   [name], PharmD, [place] [employer] [phone]

## 2019-02-20 ENCOUNTER — Other Ambulatory Visit: Payer: Self-pay

## 2019-02-20 ENCOUNTER — Ambulatory Visit: Payer: Self-pay | Admitting: Licensed Clinical Social Worker

## 2019-02-20 ENCOUNTER — Ambulatory Visit (INDEPENDENT_AMBULATORY_CARE_PROVIDER_SITE_OTHER): Payer: Medicare HMO | Admitting: *Deleted

## 2019-02-20 DIAGNOSIS — Z794 Long term (current) use of insulin: Secondary | ICD-10-CM

## 2019-02-20 DIAGNOSIS — E118 Type 2 diabetes mellitus with unspecified complications: Secondary | ICD-10-CM

## 2019-02-20 DIAGNOSIS — J449 Chronic obstructive pulmonary disease, unspecified: Secondary | ICD-10-CM

## 2019-02-20 NOTE — Chronic Care Management (AMB) (Signed)
  Care Management Note   Kendra [name] is a 73 y.o. year old female who is a primary care patient of [name], NP. The CM team was consulted for assistance with chronic disease management and care coordination.   I reached out to [name] by phone today.   Review of patient status, including review of consultants reports, relevant laboratory and other test results, and collaboration with appropriate care team members and the patient's provider was performed as part of comprehensive patient evaluation and provision of chronic care management services.   Goals Addressed    . "I need dental assistance" (pt-stated)       Current Barriers:  . Financial constraints . Limited social support . Social Isolation . Limited education about dental assistance resources*  Clinical Social Work Clinical Goal(s):  [name] Over the next 90 days, client will work with SW to address concerns related to gaining appropriate education on dental/denture assistance resources  Interventions: . Patient interviewed and appropriate assessments performed . Provided patient with information about available dental support resources within [place] . Discussed plans with patient for ongoing care management follow up and provided patient with direct contact information for care management team . Advised patient to review affordable denture/dental resources that LCSW sent to her son- UPDATE* patient reports not receiving resources yet from son. LCSW re-sent these resources again to patient's son on 02/20/2019 and encouraged patient to ask son to print them out of there OR LCSW can mail them to her residence if this not feasible  . Collaborated with patient's son through email re: emailing dental resources to him again instead of mailing them to patient's residence to ensure stable delivery. Brief dental resource education provided to family as well. . Assisted patient/caregiver with obtaining information about  health plan benefits  Patient Self Care Activities:  . Attends all scheduled provider appointments . Calls provider office for new concerns or questions  Please see past updates related to this goal by clicking on the "Past Updates" button in the selected goal    Follow Up Plan: The care management team will reach out to the patient again over the next 14 days.   [name], BSW, MSW, [email or web address] Phone: [phone]

## 2019-02-20 NOTE — Patient Instructions (Signed)
Thank you allowing the Chronic Care Management Team to be a part of your care! It was a pleasure speaking with you today!   CCM (Chronic Care Management) Team   Veverly Larimer RN, BSN Nurse Care Coordinator  [phone]  [name] PharmD  Clinical Pharmacist  [phone]  [name] LCSW Clinical Social Worker [phone]  Goals Addressed            This Visit's Progress   . "I need some help with my sugar" (pt-stated)       Current Barriers:  [name] Knowledge Deficits related to diabetes management  Nurse Case Manager Clinical Goal(s):  [name] Over the next 30 days, patient will verbalize understanding of plan for diabetes management . Over the next 30 days, patient will demonstrate improved health management independence as evidenced byperforming self health management activities such as checking cbg's/recording, working with CM team to address needs around chronic disease management  Interventions:  . Evaluation of current treatment plan related to diabetes management and patient's adherence to plan as established by provider. . Provided education to patient re: assistance being provided with medication management and importance of med adherence, self health management activities (see patient education provided) . Reviewed medications with patient and discussed collaboration with CM Clinic pharmacist . Collaborated with Pharmacist  regarding medication management needs . Signed patient up to receive Glucerna coupons via her son's email with their permission. . Mailed Diabetes Diet education to patient as discussed.  . Reviewed diabetes diet information-Patient stating she drinks OJ with most meals discussed options . Patient has not received glucerna coupons will re-do application . Patient reports sugars for the last two mornings were 85 this am and yesterday 63. Patient stated her fingers were very sore and she has not been taking her sugar in the evenings.   . Discussed meals, patient reported adding breakfast for the past two mornings which included protein.  . Will discuss with pharmacist the feasibility of continuous CBG monitor.  . Patient stated she was out of her Ventolin inhaler . [facility] a [facility] and inquire about delivery related to patient stating her son's car was broken . Called patient back to give her delivery instructions per Rich the pharmacist, they use USPS and the customer has to have a CC on file. . Patient discussed she was attempting to find a new place to live, encouraged her to speak with CCM Team LCSW Brooke for resources.    Patient Self Care Activities:  . Currently UNABLE TO independently self manage diabetes . Performs ADL's independently . Calls provider office for new concerns or questions  Please see past updates related to this goal by clicking on the "Past Updates" button in the selected goal         The patient verbalized understanding of instructions provided today and declined a print copy of patient instruction materials.   The care management team will reach out to the patient again over the next 14 days.  The patient has been provided with contact information for the care management team and has been advised to call with any health related questions or concerns.

## 2019-02-20 NOTE — Chronic Care Management (AMB) (Signed)
Chronic Care Management   Follow Up Note   02/20/2019 Name: Kendra [name] MRN: [id number] DOB: 07-24-1946  Referred by: Kendra College, Kendra [name] Reason for referral : Chronic Care Management (DM, Glucerna Ordered)   Kendra [name] is a 73 y.o. year old female who is a primary care patient of Kendra [employer], Kendra [name]. The CCM team was consulted for assistance with chronic disease management and care coordination needs.    Review of patient status, including review of consultants reports, relevant laboratory and other test results, and collaboration with appropriate care team members and the patient's provider was performed as part of comprehensive patient evaluation and provision of chronic care management services.    Goals Addressed            This Visit's Progress   . "I need some help with my sugar" (pt-stated)       Current Barriers:  [name] Knowledge Deficits related to diabetes management  Nurse Case Manager Clinical Goal(s):  [name] Over the next 30 days, patient will verbalize understanding of plan for diabetes management . Over the next 30 days, patient will demonstrate improved health management independence as evidenced byperforming self health management activities such as checking cbg's/recording, working with CM team to address needs around chronic disease management  Interventions:  . Evaluation of current treatment plan related to diabetes management and patient's adherence to plan as established by provider. . Provided education to patient re: assistance being provided with medication management and importance of med adherence, self health management activities (see patient education provided) . Reviewed medications with patient and discussed collaboration with CM Clinic pharmacist . Collaborated with Pharmacist  regarding medication management needs . Signed patient up to receive Glucerna coupons via her son's email with their permission. . Mailed Diabetes Diet  education to patient as discussed.  . Reviewed diabetes diet information-Patient stating she drinks OJ with most meals discussed options . Patient has not received glucerna coupons will re-do application . Patient reports sugars for the last two mornings were 85 this am and yesterday 63. Patient stated her fingers were very sore and she has not been taking her sugar in the evenings.  . Discussed meals, patient reported adding breakfast for the past two mornings which included protein.  . Will discuss with pharmacist the feasibility of continuous CBG monitor.  . Patient stated she was out of her Ventolin inhaler . [facility] to request a refill and inquire about delivery related to patient stating her son's car was broken . Called patient back to give her delivery instructions per Rich the pharmacist, they use USPS and the customer has to have a CC on file. . Patient discussed she was attempting to find a new place to live, encouraged her to speak with CCM Team LCSW Brooke for resources.    Patient Self Care Activities:  . Currently UNABLE TO independently self manage diabetes . Performs ADL's independently . Calls provider office for new concerns or questions  Please see past updates related to this goal by clicking on the "Past Updates" button in the selected goal          The care management team will reach out to the patient again over the next 14 days.  The patient has been provided with contact information for the care management team and has been advised to call with any health related questions or concerns.    Merlene Morse Kendra Gunnerson RN, BSN Nurse Case Pharmacist, community  [facility]  [phone]) [employer]

## 2019-02-21 ENCOUNTER — Ambulatory Visit: Payer: Self-pay | Admitting: Pharmacist

## 2019-02-21 DIAGNOSIS — E118 Type 2 diabetes mellitus with unspecified complications: Secondary | ICD-10-CM

## 2019-02-21 DIAGNOSIS — J449 Chronic obstructive pulmonary disease, unspecified: Secondary | ICD-10-CM | POA: Diagnosis not present

## 2019-02-21 DIAGNOSIS — Z794 Long term (current) use of insulin: Secondary | ICD-10-CM

## 2019-02-21 NOTE — Chronic Care Management (AMB) (Signed)
Chronic Care Management   Follow Up Note   02/21/2019 Name: Kendra [name] MRN: [id number] DOB: May 04, 1946  Referred by: Kendra College, NP Reason for referral : Chronic Care Management (Patient Phone Call)   Kendra [name] is a 73 y.o. year old female who is a primary care patient of Kendra [employer], NP. The CCM team was consulted for assistance with chronic disease management and care coordination needs.  Kendra [name] has a medical history which includes but is not limited to recent CVA, Type 2 Diabetes, hypertension, hyperlipidemia and COPD.  I reached out to Kendra [name] by phone today regarding her diabetes medication management and medication adherence.  Review of patient status, including review of consultants reports, relevant laboratory and other test results, and collaboration with appropriate care team members and the patient's provider was performed as part of comprehensive patient evaluation and provision of chronic care management services.    Goals Addressed            This Visit's Progress   . Medication Adherence       Current Barriers:  . Financial Barriers . Knowledge deficits related to coordination of her own care  Pharmacist Clinical Goal(s):  [name] Over the next 30 days, patient will demonstrate improved medication adherence as evidenced by verbalized understanding of prescribed medication regimen, assistance available, and patient report of adherence  Over the next 30 days, patient will work with CM Pharmacist to optimize medication management.  Interventions:  Collaborate with CM Nurse Case Manager regarding patient's blood sugar and diabetes management  RNCM reports that patient has been working on eating more regular meals (restarted eating breakfast) as discussed. However, fasting morning blood sugars have remained low.  Reports patient has not been checking blood sugar as frequently as her fingertips sore  Discuss continuous  blood glucose monitoring - patient not currently eligible for coverage per guidelines from [employer] website  Medication adherence: - Have patient visually review her pillbox - patient denies any missed doses this week - Reports that her son helped her to fill her weekly pillbox - Confirms currently taking her Lantus 20 units each night and metformin as directed. Denies missed doses - Reports not using Trelegy inhaler once daily as directed. Counsel patient again about the importance of using this inhaler once daily as directed  Counsel patient about the importance of eating regular meals, particularly breakfast  Patient reports that she has been working on eating breakfast regularly, but states "my appetite isn't right"  Will collaborate with RNCM  Note RNCM currently assisting patient with obtaining samples of and coupons for Glucerna  Collaborate with Kendra [name] regarding patient's reported blood sugars and her medication management. Provider agrees with decreasing her Lantus dose from 20 units to 18 units nightly  Patient advised to decrease her Lantus dose to 18 units nightly as directed by provider. Patient confirms understanding via teach-back method.  Counsel patient again on signs of low blood sugar and proper management of lows  Counsel patient regarding blood glucose testing technique to reduce fingertip soreness and importance of testing regularly as directed ? Counsel patient on importance of changing out lancet from lanceting device with every use  Patient Self Care Activities:  . Currently able to manage medications with assistance of sons, Kendra [name] and Kendra [name] . Calls pharmacy for medication refills . Calls provider office for new concerns or questions  . Patient to check blood sugars twice daily and blood pressure daily and keeping log of each.  Date Fasting Blood Glucose  28 - May 94  29 - May 96  30 - May 79  31 - May 88  1 - June 72  2 - June -  3 - June -  4 -  June 85  5 - June 69  Average 83   *Not checking blood sugar twice daily  Please see past updates related to this goal by clicking on the "Past Updates" button in the selected goal          Plan  The care management team will reach out to the patient again over the next 7 days.   Kendra [name], PharmD, [place] [employer] [phone]

## 2019-02-25 ENCOUNTER — Ambulatory Visit: Payer: Self-pay | Admitting: Pharmacist

## 2019-02-25 DIAGNOSIS — J449 Chronic obstructive pulmonary disease, unspecified: Secondary | ICD-10-CM | POA: Diagnosis not present

## 2019-02-25 DIAGNOSIS — E118 Type 2 diabetes mellitus with unspecified complications: Secondary | ICD-10-CM | POA: Diagnosis not present

## 2019-02-25 DIAGNOSIS — Z794 Long term (current) use of insulin: Secondary | ICD-10-CM

## 2019-02-25 NOTE — Patient Instructions (Signed)
Thank you allowing the Chronic Care Management Team to be a part of your care! It was a pleasure speaking with you today!     CCM (Chronic Care Management) Team    [name] RN, BSN Nurse Care Coordinator  [phone]   [name] PharmD  Clinical Pharmacist  [phone]   [name] LCSW Clinical Social Worker [phone]  Visit Information  Goals Addressed            This Visit's Progress   . Medication Adherence       Current Barriers:  . Financial Barriers . Knowledge deficits related to coordination of her own care . Lack of blood sugar results for clinical team  Pharmacist Clinical Goal(s):  [name] Over the next 30 days, patient will demonstrate improved medication adherence as evidenced by verbalized understanding of prescribed medication regimen, assistance available, and patient report of adherence  Over the next 30 days, patient will work with CM Pharmacist to optimize medication management.  Interventions:  Counsel patient on the importance of medication adherence  Confirms currently taking her Lantus 18 units each night and metformin as directed. Denies missed doses  Reports using Trelegy inhaler once daily as directed and rinsing mouth out after each use.   Inquire about patient's appetite. Ms. Laatsch reports her appetite is improved. Reports that she believes that her previously poor appetite was related to stress in her life, but that it has improved and that she is eating regular meals, including breakfast  Patient reports improvement in comfort with blood glucose testing now that she has stopped reusing lancets as counseled during our last call  Follow up with patient regarding blood sugar results. Patient unable to accurately report results over the past few days as she has not been recording dates with her readings. Patient reports difficulty with fitting all information into current log.   Reports blood sugar results from today:    106 mg/dL morning fasting  108 mg/dL after breakfast  Denies any low blood sugar readings or symptoms since her last Lantus dose adjustment  Mail patient large-print blood sugar log for improved ease with recording blood sugar results  Inquire about follow up with [place] [facility]uitline for smoking cessation. Ms. Corona says that she was unsuccessful with reaching the [place] Quitline with the phone number that she tried. Provide her with phone number again.  Patient Self Care Activities:  . Currently able to manage medications with assistance of sons, [name] and [name] . Calls pharmacy for medication refills . Calls provider office for new concerns or questions  . Patient to check blood sugars twice daily and blood pressure daily and keeping log of each.  Please see past updates related to this goal by clicking on the "Past Updates" button in the selected goal          The patient verbalized understanding of instructions provided today and declined a print copy of patient instruction materials.   Telephone follow up appointment with care management team member scheduled for: 02/28/19  [name], PharmD, [place] [facility] [phone]

## 2019-02-25 NOTE — Chronic Care Management (AMB) (Signed)
Chronic Care Management   Follow Up Note   02/25/2019 Name: Kendra [name] MRN: [id number] DOB: Nov 11, 1945  Referred by: [name], NP Reason for referral : Chronic Care Management (Patient Phone Call)   Kendra [name] is a 73 y.o. year old female who is a primary care patient of [name], NP. The CCM team was consulted for assistance with chronic disease management and care coordination needs.  Kendra [name] has a medical history which includes but is not limited to recent CVA, Type 2 Diabetes, hypertension, hyperlipidemia and COPD.  I reached out to Kendra [name] by phone today regarding her diabetes medication management and medication adherence.  Review of patient status, including review of consultants reports, relevant laboratory and other test results, and collaboration with appropriate care team members and the patient's provider was performed as part of comprehensive patient evaluation and provision of chronic care management services.    Goals Addressed            This Visit's Progress   . Medication Adherence       Current Barriers:  . Financial Barriers . Knowledge deficits related to coordination of her own care . Lack of blood sugar results for clinical team  Pharmacist Clinical Goal(s):  Kendra [name] Over the next 30 days, patient will demonstrate improved medication adherence as evidenced by verbalized understanding of prescribed medication regimen, assistance available, and patient report of adherence  Over the next 30 days, patient will work with CM Pharmacist to optimize medication management.  Interventions:  Counsel patient on the importance of medication adherence  Confirms currently taking her Lantus 18 units each night and metformin as directed. Denies missed doses  Reports using Trelegy inhaler once daily as directed and rinsing mouth out after each use.   Inquire about patient's appetite. Kendra [name] reports her appetite is  improved. Reports that she believes that her previously poor appetite was related to stress in her life, but that it has improved and that she is eating regular meals, including breakfast  Patient reports improvement in comfort with blood glucose testing now that she has stopped reusing lancets as counseled during our last call  Follow up with patient regarding blood sugar results. Patient unable to accurately report results over the past few days as she has not been recording dates with her readings. Patient reports difficulty with fitting all information into current log.   Reports blood sugar results from today:   106 mg/dL morning fasting  108 mg/dL after breakfast  Denies any low blood sugar readings or symptoms since her last Lantus dose adjustment  Mail patient large-print blood sugar log for improved ease with recording blood sugar results  Inquire about follow up with [place] [facility]uitline for smoking cessation. Kendra [name] says that she was unsuccessful with reaching the [place] Quitline with the phone number that she tried. Provide her with phone number again.  Patient Self Care Activities:  . Currently able to manage medications with assistance of sons, Kendra [name] and Kendra [name] . Calls pharmacy for medication refills . Calls provider office for new concerns or questions  . Patient to check blood sugars twice daily and blood pressure daily and keeping log of each.  Please see past updates related to this goal by clicking on the "Past Updates" button in the selected goal         Plan   Telephone follow up appointment with care management team member scheduled for: 02/28/19  [name], PharmD, [place] Clinical Pharmacist  [place] [employer] [phone]

## 2019-02-28 ENCOUNTER — Ambulatory Visit: Payer: Self-pay | Admitting: Pharmacist

## 2019-02-28 DIAGNOSIS — E118 Type 2 diabetes mellitus with unspecified complications: Secondary | ICD-10-CM

## 2019-02-28 DIAGNOSIS — Z794 Long term (current) use of insulin: Secondary | ICD-10-CM

## 2019-02-28 DIAGNOSIS — J449 Chronic obstructive pulmonary disease, unspecified: Secondary | ICD-10-CM | POA: Diagnosis not present

## 2019-02-28 NOTE — Patient Instructions (Signed)
Thank you allowing the Chronic Care Management Team to be a part of your care! It was a pleasure speaking with you today!     CCM (Chronic Care Management) Team    [name] RN, BSN Nurse Care Coordinator  [phone]   [name] PharmD  Clinical Pharmacist  [phone]   [name] LCSW Clinical Social Worker [phone]  Visit Information  Goals Addressed            This Visit's Progress   . Medication Adherence       Current Barriers:  . Financial Barriers . Knowledge deficits related to coordination of her own care . Lack of blood sugar results for clinical team  Pharmacist Clinical Goal(s):  [name] Over the next 30 days, patient will demonstrate improved medication adherence as evidenced by verbalized understanding of prescribed medication regimen, assistance available, and patient report of adherence  Over the next 30 days, patient will work with CM Pharmacist to optimize medication management.  Interventions:  Counsel patient on the importance of medication adherence  Confirms currently taking her Lantus 18 units each night and metformin as directed. Denies missed doses  Reports using Trelegy inhaler once daily as directed and rinsing mouth out after each use.   Address patient's question regarding her recent insulin glargine refill - patient's last prescription refilled with Basaglar brand than Lantus. Counsel patient that both insulins are insulin glargine - to use one and then the other, not both at the same time  Maish Vaya now preferred brand through patient's insurance  Follow up with patient regarding blood sugar results. Patient reports difficulty with fitting all information into current log.   Reports blood sugar results from today:   92 mg/dL morning fasting  102 mg/dL after breakfast  Denies any low blood sugar readings or symptoms since her last Lantus dose adjustment  Have mailed patient large-print blood  sugar log for improved ease with recording blood sugar results  Inquire about follow up with [place] Quitline for smoking cessation. Ms. Bahena says that she was successful with reaching the [place] Quitline and that per Quitline counselor, the program will be able to provide her with nicotine patches.  Patient Self Care Activities:  . Currently able to manage medications with assistance of sons, [name] and [name] o Using weekly pillbox as adherence aid . Calls pharmacy for medication refills . Calls provider office for new concerns or questions  . Patient to check blood sugars twice daily and blood pressure daily and keeping log of each.  Please see past updates related to this goal by clicking on the "Past Updates" button in the selected goal          The patient verbalized understanding of instructions provided today and declined a print copy of patient instruction materials.   The care management team will reach out to the patient again over the next 7 days.   [name], PharmD, [place] [employer] [phone]

## 2019-02-28 NOTE — Chronic Care Management (AMB) (Signed)
  Chronic Care Management   Follow Up Note   02/28/2019 Name: Kendra [name] MRN: [id number] DOB: 02-10-1946  Referred by: [name], NP Reason for referral : Chronic Care Management (Patient Phone Call)   Kendra [name] is a 73 y.o. year old female who is a primary care patient of [name], NP. The CCM team was consulted for assistance with chronic disease management and care coordination needs.  Kendra [name] has a medical history which includes but is not limited to recent CVA, Type 2 Diabetes, hypertension, hyperlipidemia and COPD.  I reached out to Kendra [name] by phone today regarding her diabetes medication management and medication adherence.  Review of patient status, including review of consultants reports, relevant laboratory and other test results, and collaboration with appropriate care team members and the patient's provider was performed as part of comprehensive patient evaluation and provision of chronic care management services.    Goals Addressed            This Visit's Progress   . Medication Adherence       Current Barriers:  . Financial Barriers . Knowledge deficits related to coordination of her own care . Lack of blood sugar results for clinical team  Pharmacist Clinical Goal(s):  [name] Over the next 30 days, patient will demonstrate improved medication adherence as evidenced by verbalized understanding of prescribed medication regimen, assistance available, and patient report of adherence  Over the next 30 days, patient will work with CM Pharmacist to optimize medication management.  Interventions:  Counsel patient on the importance of medication adherence  Confirms currently taking her Lantus 18 units each night and metformin as directed. Denies missed doses  Reports using Trelegy inhaler once daily as directed and rinsing mouth out after each use.   Address patient's question regarding her recent insulin glargine refill -  patient's last prescription refilled with Basaglar brand than Lantus. Counsel patient that both insulins are insulin glargine - to use one and then the other, not both at the same time  [place] now preferred brand through patient's insurance  Follow up with patient regarding blood sugar results. Patient reports difficulty with fitting all information into current log.   Reports blood sugar results from today:   92 mg/dL morning fasting  102 mg/dL after breakfast  Denies any low blood sugar readings or symptoms since her last Lantus dose adjustment  Have mailed patient large-print blood sugar log for improved ease with recording blood sugar results  Inquire about follow up with [place] [facility] for smoking cessation. Ms. Benningfield says that she was successful with reaching the [place] [facility] and that per Quitline counselor, the program will be able to provide her with nicotine patches.  Patient Self Care Activities:  . Currently able to manage medications with assistance of sons, Kendra [name] and Kendra [name] o Using weekly pillbox as adherence aid . Calls pharmacy for medication refills . Calls provider office for new concerns or questions  . Patient to check blood sugars twice daily and blood pressure daily and keeping log of each.  Please see past updates related to this goal by clicking on the "Past Updates" button in the selected goal          Plan  The care management team will reach out to the patient again over the next 7 days.   [name], PharmD, [place] [employer] [phone]

## 2019-03-04 ENCOUNTER — Telehealth: Payer: Self-pay

## 2019-03-04 ENCOUNTER — Ambulatory Visit: Payer: Self-pay | Admitting: Pharmacist

## 2019-03-04 DIAGNOSIS — I10 Essential (primary) hypertension: Secondary | ICD-10-CM

## 2019-03-04 NOTE — Telephone Encounter (Signed)
The pt called complaining of intermittent headaches x 3 days. I scheduled her for an appt for tomorrow afternoon.

## 2019-03-04 NOTE — Chronic Care Management (AMB) (Signed)
  Chronic Care Management   Follow Up Note   03/04/2019 Name: Kendra [name] MRN: [id number] DOB: 1946-03-07  Referred by: Kendra College, NP Reason for referral : Chronic Care Management (Patient Phone Call)   Kendra [name] is a 73 y.o. year old female who is a primary care patient of Kendra College, NP. The CCM team was consulted for assistance with chronic disease management and care coordination needs.  Kendra [name] has a medical history which includes but is not limited to recent CVA (11/2018), Type 2 Diabetes, hypertension, hyperlipidemia and COPD.  Receive voicemail from Kendra [name] stating that she has been having headaches for the past couple of days and requesting a call back. I reached out to Kendra [name] by phone today.   Review of patient status, including review of consultants reports, relevant laboratory and other test results, and collaboration with appropriate care team members and the patient's provider was performed as part of comprehensive patient evaluation and provision of chronic care management services.    Goals Addressed            This Visit's Progress   . Medication Adherence       Current Barriers:  . Financial Barriers . Knowledge deficits related to coordination of her own care . Lack of blood sugar results for clinical team  Pharmacist Clinical Goal(s):  [name] Over the next 30 days, patient will demonstrate improved medication adherence as evidenced by verbalized understanding of prescribed medication regimen, assistance available, and patient report of adherence  Over the next 30 days, patient will work with CM Pharmacist to optimize medication management.  Interventions: . Follow up with patient regarding voicemail about headaches o Kendra [name] reports that she has been struggling with headaches for the past 2 days, unrelieved by acetaminophen. o Confirms that she has been checking her blood pressure and blood sugar. Denies  elevated blood pressure, hypoglycemia or hyperglycemia o Denies any numbness, weakness, dizziness, issues with balance, sight or speech today.  . Advise patient to call PCP office regarding her headaches. Provide patient with clinic phone number. Confirm that patient has phone number for 24-hour nurse advice line o Perform chart review - appointment scheduled for patient with Kendra [name] for tomorrow, 6/17  Patient Self Care Activities:  . Currently able to manage medications with assistance of sons, Kendra [name] and Kendra [name] o Using weekly pillbox as adherence aid . Calls pharmacy for medication refills . Calls provider office for new concerns or questions  . Patient to check blood sugars twice daily and blood pressure daily and keeping log of each.  Please see past updates related to this goal by clicking on the "Past Updates" button in the selected goal           The care management team will reach out to the patient again on 6/19 as previously planned  [name], PharmD, [place] [phone]

## 2019-03-04 NOTE — Patient Instructions (Signed)
Thank you allowing the Chronic Care Management Team to be a part of your care! It was a pleasure speaking with you today!     CCM (Chronic Care Management) Team    [name] RN, BSN Nurse Care Coordinator  [phone]   [name] PharmD  Clinical Pharmacist  [phone]   [name] LCSW Clinical Social Worker [phone]  Visit Information  Goals Addressed            This Visit's Progress   . Medication Adherence       Current Barriers:  . Financial Barriers . Knowledge deficits related to coordination of her own care . Lack of blood sugar results for clinical team  Pharmacist Clinical Goal(s):  Kendra Kitchen Over the next 30 days, patient will demonstrate improved medication adherence as evidenced by verbalized understanding of prescribed medication regimen, assistance available, and patient report of adherence  Over the next 30 days, patient will work with CM Pharmacist to optimize medication management.  Interventions: . Follow up with patient regarding voicemail about headaches o [name] reports that she has been struggling with headaches for the past 2 days, unrelieved by acetaminophen. o Confirms that she has been checking her blood pressure and blood sugar. Denies elevated blood pressure, hypoglycemia or hyperglycemia o Denies any numbness, weakness, dizziness, issues with balance, sight or speech today.  . Advise patient to call PCP office regarding her headaches. Provide patient with clinic phone number. Confirm that patient has phone number for 24-hour nurse advice line o Perform chart review - appointment scheduled for patient with [name] for tomorrow, 6/17  Patient Self Care Activities:  . Currently able to manage medications with assistance of sons, [name] and [name] o Using weekly pillbox as adherence aid . Calls pharmacy for medication refills . Calls provider office for new concerns or questions  . Patient to check blood sugars  twice daily and blood pressure daily and keeping log of each.  Please see past updates related to this goal by clicking on the "Past Updates" button in the selected goal          The patient verbalized understanding of instructions provided today and declined a print copy of patient instruction materials.   The care management team will reach out to the patient again over the next 7 days.   [name], PharmD, [place] [employer] [phone]

## 2019-03-05 ENCOUNTER — Ambulatory Visit (INDEPENDENT_AMBULATORY_CARE_PROVIDER_SITE_OTHER): Payer: Medicare HMO | Admitting: Family Medicine

## 2019-03-05 ENCOUNTER — Encounter: Payer: Self-pay | Admitting: Family Medicine

## 2019-03-05 ENCOUNTER — Other Ambulatory Visit: Payer: Self-pay

## 2019-03-05 DIAGNOSIS — K5900 Constipation, unspecified: Secondary | ICD-10-CM

## 2019-03-05 DIAGNOSIS — R519 Headache, unspecified: Secondary | ICD-10-CM

## 2019-03-05 DIAGNOSIS — R51 Headache: Secondary | ICD-10-CM | POA: Diagnosis not present

## 2019-03-05 MED ORDER — BACLOFEN 10 MG PO TABS: 10.0000 mg | ORAL_TABLET | Freq: Every day | ORAL | 1 refills | Status: DC | PRN

## 2019-03-05 MED ORDER — POLYETHYLENE GLYCOL 3350 17 GM/SCOOP PO POWD: 17.0000 g | Freq: Every day | ORAL | 1 refills | Status: DC | PRN

## 2019-03-05 NOTE — Patient Instructions (Signed)
No Mychart access. After visit summary info given by phone

## 2019-03-05 NOTE — Progress Notes (Signed)
Virtual Visit via Telephone The purpose of this virtual visit is to provide medical care while limiting exposure to the novel coronavirus (COVID19) for both patient and office staff.  Consent was obtained for phone visit:  Yes.   Answered questions that patient had about telehealth interaction:  Yes.   I discussed the limitations, risks, security and privacy concerns of performing an evaluation and management service by telephone. I also discussed with the patient that there may be a patient responsible charge related to this service. The patient expressed understanding and agreed to proceed.  Patient Location: Home Provider Location: [facility] (Office)  PCP is [name], AGPCNP-BC - I am currently covering during her maternity leave.  ---------------------------------------------------------------------- Chief Complaint  Patient presents with  . Headache    onset week, neck pain behind right ear  . Constipation    2 tablet of DULCOLAX were taken yesterday but still having costipation.    S: Reviewed CMA documentation. I have called patient and gathered additional HPI as follows:  CONSTIPATION Reports problem with constipation for past 2 days, no BM yesterday, she describes harder dry stool, sometimes 2-3 daily is normal. She is taking dulcolax 2 tab with improvement but if doesn't take it she will be constipated. Denies dark stool blood in stool, rectal pain abdominal pain nausea vomiting  Headache Reports episodic sharp pain behind R ear, fairly constant but not resolving. Worse with laying down or sit up. In past she tried Tylenol as needed. Also she has taken Gabapentin before had nausea with that though. Used to take baclofen PRN back pain but she is out of this or lost bottle, needs new rx.   Additionally CCM pharmacy following her - she was going to get glucerna but says no transportation to pick this up, she wanted to communicate this to  [place].  Denies any high risk travel to areas of current concern for COVID19. Denies any known or suspected exposure to person with or possibly with COVID19.  Denies any fevers, chills, sweats, body ache, cough, shortness of breath, sinus pain or pressure, headache, abdominal pain, diarrhea  Past Medical History:  Diagnosis Date  . Back pain   . Cataract   . Cervical disc disorder   . Chest pain   . COPD (chronic obstructive pulmonary disease) ([place])   . Depression   . Diabetes ([place])   . Diabetic neuropathy ([place])   . GERD (gastroesophageal reflux disease)   . Hyperlipidemia   . Insomnia   . Neuropathy   . Osteoarthritis   . Osteopenia   . Reflux   . Rheumatoid arthritis ([place])   . Sleep apnea    no CPAP and no suggestion that she needed one per pt  . Stroke ([place]) 2015   and again 2017  - Weakness in left leg, staggers w/ walking  . Tenosynovitis    Social History   Tobacco Use  . Smoking status: Current Some Day Smoker    Packs/day: 0.25    Types: Cigarettes  . Smokeless tobacco: Never Used  Substance Use Topics  . Alcohol use: No  . Drug use: No    Current Outpatient Medications:  .  acetaminophen (TYLENOL) 500 MG tablet, Take 2 tablets (1,000 mg total) by mouth every 8 (eight) hours as needed for mild pain or moderate pain., Disp: 270 tablet, Rfl: 1 .  albuterol (VENTOLIN HFA) 108 (90 Base) MCG/ACT inhaler, INHALE 1 TO 2 PUFFS INTO THE LUNGS EVERY 6 HOURS AS NEEDED  FOR WHEEZING OR SHORTNESS OF BREATH, Disp: 18 g, Rfl: 2 .  aspirin 81 MG EC tablet, Take 1 tablet (81 mg total) by mouth daily. Swallow whole., Disp: 90 tablet, Rfl: 3 .  atorvastatin (LIPITOR) 40 MG tablet, Take 1 tablet (40 mg total) by mouth daily with supper., Disp: 90 tablet, Rfl: 3 .  baclofen (LIORESAL) 10 MG tablet, Take 1 tablet (10 mg total) by mouth daily as needed for muscle spasms., Disp: 90 each, Rfl: 1 .  bisacodyl (DULCOLAX) 5 MG EC tablet, Take 5 mg by mouth daily as needed for moderate  constipation., Disp: , Rfl:  .  Blood Glucose Monitoring Suppl (ONETOUCH VERIO) w/Device KIT, 1 kit by Does not apply route 2 (two) times daily., Disp: 1 kit, Rfl: 0 .  Cholecalciferol (VITAMIN D3) 50 MCG (2000 UT) capsule, Take 1 capsule (2,000 Units total) by mouth daily., Disp: 90 capsule, Rfl: 1 .  Fluticasone-Umeclidin-Vilant (TRELEGY ELLIPTA) 100-62.5-25 MCG/INH AEPB, Inhale 1 puff into the lungs daily., Disp: 90 each, Rfl: 3 .  glucose blood (ONETOUCH VERIO) test strip, Use as instructed, Disp: 200 each, Rfl: 4 .  Insulin Glargine (LANTUS SOLOSTAR) 100 UNIT/ML Solostar Pen, INJECT  24 UNITS SUBCUTANEOUSLY EVERY DAY  AT  10PM (Patient taking differently: Inject 18 Units into the skin at bedtime. ), Disp: 8 pen, Rfl: 3 .  Insulin Pen Needle (BD PEN NEEDLE NANO U/F) 32G X 4 MM MISC, USE WITH LANTUS AS DIRECTED, Disp: 90 each, Rfl: 3 .  Lancet Devices (ONE TOUCH DELICA LANCING DEV) MISC, 1 Device by Does not apply route 2 (two) times daily., Disp: 1 each, Rfl: 0 .  lisinopril (PRINIVIL,ZESTRIL) 2.5 MG tablet, Take 1 tablet (2.5 mg total) by mouth daily., Disp: 90 tablet, Rfl: 3 .  meloxicam (MOBIC) 15 MG tablet, Take 1 tablet (15 mg total) by mouth daily as needed (for arthritis pain)., Disp: 90 tablet, Rfl: 1 .  metFORMIN (GLUCOPHAGE-XR) 500 MG 24 hr tablet, Take 1 tablet (500 mg total) by mouth 2 (two) times daily with a meal., Disp: 180 tablet, Rfl: 1 .  nicotine (NICODERM CQ - DOSED IN MG/24 HOURS) 14 mg/24hr patch, Place onto the skin., Disp: , Rfl:  .  OneTouch Delica Lancets 41U MISC, 1 Device by Does not apply route 2 (two) times daily., Disp: 200 each, Rfl: 4 .  polyethylene glycol powder (GLYCOLAX/MIRALAX) 17 GM/SCOOP powder, Take 17-34 g by mouth daily as needed for mild constipation or moderate constipation., Disp: 255 g, Rfl: 1  Depression screen [facility] 2/9 03/05/2019 05/07/2018 01/04/2018  Decreased Interest 2 2 0  Down, Depressed, Hopeless _0 PHQ - 2 Score _1 Altered sleeping _2 Tired, decreased energy _3 Change in appetite 1 0 0  Feeling bad or failure about yourself  2 2 0  Trouble concentrating 3 3 0  Moving slowly or fidgety/restless 2 3 0  Suicidal thoughts 1 0 0  PHQ-9 Score _4 Difficult doing work/chores Not difficult at all Not difficult at all Not difficult at all   [place]-Suicide Severity Rating Scale 1) Have you wished you were dead or wished you could go to sleep and not wake up? - Yes - passive ideation  2) Have you had any actual thoughts of killing yourself? - No  Skip questions 3,4, 5  6) Have you ever done anything, started to do anything, or prepared to do anything to end your life? -  No   GAD 7 : Generalized Anxiety Score 02/17/2019  Nervous, Anxious, on Edge (No Data)    -------------------------------------------------------------------------- O: No physical exam performed due to remote telephone encounter.  Lab results reviewed.  No results found for this or any previous visit (from the past 2160 hour(s)).  -------------------------------------------------------------------------- A&P:  Problem List Items Addressed This Visit    None    Visit Diagnoses    Constipation, unspecified constipation type    -  Primary  Clinically seems to be functional constipation, without GI red flags Improved on dulcolax, still incomplete resolution  Trial miralax, instructions given titrate up 1-2 caps daily PRN may adjust further, to goal 2-3 soft stool per day Keep hydrating, increase fiber Follow up if not improving    Relevant Medications   polyethylene glycol powder (GLYCOLAX/MIRALAX) 17 GM/SCOOP powder   Acute nonintractable headache, unspecified headache type       Relevant Medications   baclofen (LIORESAL) 10 MG tablet    Uncertain exact etiology of headache today, seems to be possible tension headache vs pinched nerve No longer on baclofen, will re order this to try as it has helped her before She has failed  gabapentin due to side effect nausea, advised this was another option, but she prefers to try baclofen first - increase tylenol to 1g TID PRN   Meds ordered this encounter  Medications  . polyethylene glycol powder (GLYCOLAX/MIRALAX) 17 GM/SCOOP powder    Sig: Take 17-34 g by mouth daily as needed for mild constipation or moderate constipation.    Dispense:  255 g    Refill:  1  . baclofen (LIORESAL) 10 MG tablet    Sig: Take 1 tablet (10 mg total) by mouth daily as needed for muscle spasms.    Dispense:  90 each    Refill:  1    Always use easy open prescription bottles.   Follow-up: - Return in 4-6 weeks if not improved headache w/ PCP  Patient verbalizes understanding with the above medical recommendations including the limitation of remote medical advice.  Specific follow-up and call-back criteria were given for patient to follow-up or seek medical care more urgently if needed.   - Time spent in direct consultation with patient on phone: 12 minutes  Nobie Putnam, [facility] 03/05/2019, 3:00 PM

## 2019-03-06 ENCOUNTER — Ambulatory Visit: Payer: Self-pay | Admitting: Licensed Clinical Social Worker

## 2019-03-06 DIAGNOSIS — J449 Chronic obstructive pulmonary disease, unspecified: Secondary | ICD-10-CM | POA: Diagnosis not present

## 2019-03-06 DIAGNOSIS — E118 Type 2 diabetes mellitus with unspecified complications: Secondary | ICD-10-CM | POA: Diagnosis not present

## 2019-03-06 DIAGNOSIS — Z794 Long term (current) use of insulin: Secondary | ICD-10-CM | POA: Diagnosis not present

## 2019-03-06 NOTE — Chronic Care Management (AMB) (Signed)
  Chronic Care Management    Clinical Social Work Follow Up Note  03/06/2019 Name: AJEENAH HEINY MRN: [id number] DOB: [birth year]  Tresa Moore Kratzke is a 73 y.o. year old female who is a primary care patient of [name], NP. The CCM team was consulted for assistance with [employer].   Review of patient status, including review of consultants reports, other relevant assessments, and collaboration with appropriate care team members and the patient's provider was performed as part of comprehensive patient evaluation and provision of chronic care management services.     Goals Addressed    . "I need dental assistance" (pt-stated)       Current Barriers:  . Financial constraints . Limited social support . Social Isolation . Limited education about dental assistance resources*  Clinical Social Work Clinical Goal(s):  [name] Over the next 90 days, client will work with SW to address concerns related to gaining appropriate education on dental/denture assistance resources  Interventions: . Patient interviewed and appropriate assessments performed . Provided patient with information about available dental support resources within [place] . Discussed plans with patient for ongoing care management follow up and provided patient with direct contact information for care management team . Advised patient to review affordable denture/dental resources that LCSW sent to her son- UPDATE* patient reports not receiving resources yet from son. LCSW re-sent these resources again to patient's son on 02/20/2019 and encouraged patient to ask son to print them out of there. Patient reports still not receiving information. LCSW mailed out these resources to patient on 03/06/2019. Patient was appreciative.  Nash Dimmer with patient's son through email  . Brief dental resource education provided to patient . Assisted patient/caregiver with obtaining information about health plan benefits  Patient Self  Care Activities:  . Attends all scheduled provider appointments . Calls provider office for new concerns or questions  Please see past updates related to this goal by clicking on the "Past Updates" button in the selected goal     Follow Up Plan: SW will follow up with patient by phone over the next 30 days  [name], [employer], MSW, [email or web address] Phone: [phone]

## 2019-03-07 ENCOUNTER — Ambulatory Visit: Payer: Self-pay | Admitting: Pharmacist

## 2019-03-07 DIAGNOSIS — R519 Headache, unspecified: Secondary | ICD-10-CM

## 2019-03-07 NOTE — Patient Instructions (Signed)
Thank you allowing the Chronic Care Management Team to be a part of your care! It was a pleasure speaking with you today!     CCM (Chronic Care Management) Team    [name] RN, BSN Nurse Care Coordinator  [phone]   [name] PharmD  Clinical Pharmacist  [phone]   [name] LCSW Clinical Social Worker [phone]  Visit Information  Goals Addressed            This Visit's Progress   . Medication Adherence       Current Barriers:  . Financial Barriers . Knowledge deficits related to coordination of her own care . Lack of blood sugar results for clinical team  Pharmacist Clinical Goal(s):  [name] Over the next 30 days, patient will demonstrate improved medication adherence as evidenced by verbalized understanding of prescribed medication regimen, assistance available, and patient report of adherence  Over the next 30 days, patient will work with CM Pharmacist to optimize medication management.  Interventions: . Follow up with patient regarding headaches o Denies change/improvement; reports continues to have daily headache. o  Note patient seen by [name] on 6/17 for evaluation of headaches. Constipation also addressed. Denies picking up ordered medications from pharmacy. o Note [facility] currently offering free prescription delivery; patient has reported using this service in the past few weeks. . Advise patient to call [facility] when we hang up now to arrange for delivery of medications as ordered by [name]. . Discuss with patient current lack of transportation; advise patient to reach out to [facility] Social Worker if she finds that she needs assistance with getting to medical appointments.  Patient Self Care Activities:  . Currently able to manage medications with assistance of sons, [name] and [name] o Using weekly pillbox as adherence aid . Calls pharmacy for medication refills . Calls provider office for new concerns or  questions  . Patient to check blood sugars twice daily and blood pressure daily and keep log of each. . Patient to talk to family to see who might be able to help her with transportation, including assisting her with pick up of Glucerna samples from office.  Please see past updates related to this goal by clicking on the "Past Updates" button in the selected goal          The patient verbalized understanding of instructions provided today and declined a print copy of patient instruction materials.   The care management team will reach out to the patient again over the next 7 days.   [name], PharmD, [place] [employer] [phone]

## 2019-03-07 NOTE — Chronic Care Management (AMB) (Addendum)
  Chronic Care Management   Follow Up Note   03/07/2019 Name: Kendra [name] MRN: [id number] DOB: 1945/10/03  Referred by: [name], NP Reason for referral : Chronic Care Management (Patient Phone Call)   Kendra [name] is a 73 y.o. year old female who is a primary care patient of [name], NP. The CCM team was consulted for assistance with chronic disease management and care coordination needs.  Kendra [name] has a medical history which includes but is not limited to recent CVA, Type 2 Diabetes, hypertension, hyperlipidemia and COPD.  I reached out to Kendra [name] by phone today for follow up regarding her headaches and medication adherence. Patient unable to continue conversation as she is currently busy with grandchildren and other family who are visiting.  Review of patient status, including review of consultants reports, relevant laboratory and other test results, and collaboration with appropriate care team members and the patient's provider was performed as part of comprehensive patient evaluation and provision of chronic care management services.    Goals Addressed            This Visit's Progress   . Medication Adherence       Current Barriers:  . Financial Barriers . Knowledge deficits related to coordination of her own care . Lack of blood sugar results for clinical team  Pharmacist Clinical Goal(s):  [name] Over the next 30 days, patient will demonstrate improved medication adherence as evidenced by verbalized understanding of prescribed medication regimen, assistance available, and patient report of adherence  Over the next 30 days, patient will work with CM Pharmacist to optimize medication management.  Interventions: . Follow up with patient regarding headaches o Denies change/improvement; reports continues to have daily headache. o  Note patient seen by [name] on 6/17 for evaluation of headaches. Constipation also addressed.  Denies picking up ordered medications from pharmacy. o Note [facility] currently offering free prescription delivery; patient has reported using this service in the past few weeks. . Advise patient to call [facility] when we hang up now to arrange for delivery of medications as ordered by [name]. . Discuss with patient current lack of transportation; advise patient to reach out to [facility] Social Worker if she finds that she needs assistance with getting to medical appointments. . Will collaborate with CM Social Worker and CM Nurse Case Manager regarding patient's current transportation concerns  Patient Self Care Activities:  . Currently able to manage medications with assistance of sons, [name] and [name] o Using weekly pillbox as adherence aid . Calls pharmacy for medication refills . Calls provider office for new concerns or questions  . Patient to check blood sugars twice daily and blood pressure daily and keep log of each. . Patient to talk to family to see who might be able to help her with transportation, including assisting her with pick up of Glucerna samples from office.  Please see past updates related to this goal by clicking on the "Past Updates" button in the selected goal          Plan  The care management team will reach out to the patient again over the next 7 days.   [name], PharmD, [place] [employer] [phone]

## 2019-03-11 ENCOUNTER — Ambulatory Visit: Payer: Self-pay | Admitting: Pharmacist

## 2019-03-11 DIAGNOSIS — R519 Headache, unspecified: Secondary | ICD-10-CM

## 2019-03-11 NOTE — Patient Instructions (Signed)
Thank you allowing the Chronic Care Management Team to be a part of your care! It was a pleasure speaking with you today!     CCM (Chronic Care Management) Team    [name] RN, BSN Nurse Care Coordinator  [phone]   [name] PharmD  Clinical Pharmacist  [phone]   [name] LCSW Clinical Social Worker [phone]  Visit Information  Goals Addressed            This Visit's Progress   . Medication Adherence       Current Barriers:  . Financial Barriers . Knowledge deficits related to coordination of her own care . Lack of blood sugar results for clinical team  Pharmacist Clinical Goal(s):  [name] Over the next 30 days, patient will demonstrate improved medication adherence as evidenced by verbalized understanding of prescribed medication regimen, assistance available, and patient report of adherence  Over the next 30 days, patient will work with CM Pharmacist to optimize medication management.  Interventions: . [facility]'s [facility]. [facility] states that this prescription is not covered through patient's Part D plan.  o Note that baclofen is a covered tier 3 option through patient's Part D coverage and patient has full extra help subsidy o Assist community pharmacist with billing of prescription. Prescription covered-copayment is $3.60 for 90 day supply. . Follow up with patient regarding headaches and her baclofen prescription o Ms. Savard reports that she has been continuing to take acetaminophen 500 mg BID without improvement.  - Note when patient seen by [name] on 6/17 for evaluation of headaches, provider instructed patient to "increase tylenol to 1g TID PRN". Reiterate this recommendation from provider to patient and Ms. Rattigan verbalizes understanding. o Let Ms. Peary know that baclofen prescription is covered through her plan. o Ms. Rosario states that she will ask her  nephew to help her to pay her copayment to pick up the medication . Discuss with patient current lack of transportation; advise patient to reach out to [facility] Social Worker if she finds that she needs assistance with getting to medical appointments.  Patient Self Care Activities:  . Currently able to manage medications with assistance of sons, [name] and [name] o Using weekly pillbox as adherence aid . Calls pharmacy for medication refills . Calls provider office for new concerns or questions  . Patient to check blood sugars twice daily and blood pressure daily and keep log of each. . Patient to talk to family to see who might be able to help her with transportation, including assisting her with pick up of Glucerna samples from office.  Please see past updates related to this goal by clicking on the "Past Updates" button in the selected goal          The patient verbalized understanding of instructions provided today and declined a print copy of patient instruction materials.   Telephone follow up appointment with care management team member scheduled for: 6/24  [name], PharmD, [place] [employer] [phone]

## 2019-03-11 NOTE — Chronic Care Management (AMB) (Signed)
Chronic Care Management   Follow Up Note   03/11/2019 Name: Kendra [name] MRN: [id number] DOB: 1946/05/11  Referred by: Kendra College, NP Reason for referral : Chronic Care Management (Patient Phone Call) and Care Coordination ([facility])   Kendra [name] is a 73 y.o. year old female who is a primary care patient of Kendra College, NP. The CCM team was consulted for assistance with chronic disease management and care coordination needs.  Kendra [name] has a medical history which includes but is not limited to recent CVA, Type 2 Diabetes, hypertension, hyperlipidemia and COPD.  Receive a voicemail from Kendra [name] stating that she continues to have headaches and was unable to pick up her baclofen prescription from the pharmacy because it was too expensive.   [facility] to assist with resolving [facility] issue and then to Kendra [name] by phone today.   Review of patient status, including review of consultants reports, relevant laboratory and other test results, and collaboration with appropriate care team members and the patient's provider was performed as part of comprehensive patient evaluation and provision of chronic care management services.    Goals Addressed            This Visit's Progress   . Medication Adherence       Current Barriers:  . Financial Barriers . Knowledge deficits related to coordination of her own care . Lack of blood sugar results for clinical team  Pharmacist Clinical Goal(s):  [name] Over the next 30 days, patient will demonstrate improved medication adherence as evidenced by verbalized understanding of prescribed medication regimen, assistance available, and patient report of adherence  Over the next 30 days, patient will work with CM Pharmacist to optimize medication management.  Interventions: . [facility]'s [facility]. [facility] states that this  prescription is not covered through patient's Part D plan.  o Note that baclofen is a covered tier 3 option through patient's Part D coverage and patient has full extra help subsidy o Assist community pharmacist with billing of prescription. Prescription covered-copayment is $3.60 for 90 day supply. . Follow up with patient regarding headaches and her baclofen prescription o Kendra [name] reports that she has been continuing to take acetaminophen 500 mg BID without improvement.  - Note when patient seen by Kendra [name] on 6/17 for evaluation of headaches, provider instructed patient to "increase tylenol to 1g TID PRN". Reiterate this recommendation from provider to patient and Kendra [name] verbalizes understanding. o Let Kendra [name] know that baclofen prescription is covered through her plan. o Kendra [name] states that she will ask her nephew to help her to pay her copayment to pick up the medication . Discuss with patient current lack of transportation; advise patient to reach out to Kendra [name] if she finds that she needs assistance with getting to medical appointments.  Patient Self Care Activities:  . Currently able to manage medications with assistance of sons, Kendra [name] and Kendra [name] o Using weekly pillbox as adherence aid . Calls pharmacy for medication refills . Calls provider office for new concerns or questions  . Patient to check blood sugars twice daily and blood pressure daily and keep log of each. . Patient to talk to family to see who might be able to help her with transportation, including assisting her with pick up of Kendra [name] samples from office.  Please see past updates related to this goal by clicking on the "Past Updates"  button in the selected goal          Plan  CM Pharmacist to follow up with patient for telephone visit, as previously scheduled, on 6/24 at 3 pm to review blood sugar results  [name], PharmD, [place] [phone]

## 2019-03-12 ENCOUNTER — Ambulatory Visit: Payer: Self-pay | Admitting: Pharmacist

## 2019-03-12 DIAGNOSIS — Z794 Long term (current) use of insulin: Secondary | ICD-10-CM | POA: Diagnosis not present

## 2019-03-12 DIAGNOSIS — R519 Headache, unspecified: Secondary | ICD-10-CM

## 2019-03-12 DIAGNOSIS — E118 Type 2 diabetes mellitus with unspecified complications: Secondary | ICD-10-CM

## 2019-03-12 DIAGNOSIS — J449 Chronic obstructive pulmonary disease, unspecified: Secondary | ICD-10-CM | POA: Diagnosis not present

## 2019-03-12 NOTE — Patient Instructions (Signed)
Thank you allowing the Chronic Care Management Team to be a part of your care! It was a pleasure speaking with you today!     CCM (Chronic Care Management) Team    [name] RN, BSN Nurse Care Coordinator  [phone]   [name] PharmD  Clinical Pharmacist  [phone]   [name] LCSW Clinical Social Worker [phone]  Visit Information  Goals Addressed            This Visit's Progress   . Medication Adherence       Current Barriers:  . Financial Barriers . Knowledge deficits related to coordination of her own care . Lack of blood sugar results for clinical team  Pharmacist Clinical Goal(s):  [name] Over the next 30 days, patient will demonstrate improved medication adherence as evidenced by verbalized understanding of prescribed medication regimen, assistance available, and patient report of adherence  Over the next 30 days, patient will work with CM Pharmacist to optimize medication management.  Interventions: . Follow up with patient regarding headaches and her baclofen prescription o Reports able to pick up baclofen prescription and start medication this morning. o Caution Ms. Plog about risk of sedation and dizziness with use. She reports feeling dizzy with use this morning.  Advise patient to lay down after taking to reduce risk of falling. o Reports improvement in headache pain; that the baclofen "eased it down some" . Counsel on importance of medication adherence and blood sugar control o Reports missing evening doses of metformin a couple of times this week due to headache.  o Reports continuing to use weekly pillbox and evening phone alarm for insulin administration time to aid with adherence o Reports improved ability to log her blood sugars with new large-print log . Counsel patient on administration instructions for Miralax poweder as prescribed by PCP . Counsel on continued importance of social distancing and use of face mask due to  COVID-19  Patient Self Care Activities:  . Currently able to manage medications with assistance of sons, [name] and [name] o Using weekly pillbox as adherence aid o Alarm on phone set for 10 pm as reminder for insulin adminisration . Calls pharmacy for medication refills . Calls provider office for new concerns or questions  . Patient to check blood sugars twice daily and blood pressure daily and keep log of each. Date Fasting Blood Glucose After Breakfast Notes  18 - June 86    19 - June 144 105   20 - June 119 115   21 - June 103    22 - June - -   23 - June - - *missed evening dose of metformin  24 - June 150     . Patient to talk to family to see who might be able to help her with transportation, including assisting her with pick up of Glucerna samples from office.  Please see past updates related to this goal by clicking on the "Past Updates" button in the selected goal          The patient verbalized understanding of instructions provided today and declined a print copy of patient instruction materials.   The care management team will reach out to the patient again over the next 14 days.   [name], PharmD, [place] [employer] [phone]

## 2019-03-12 NOTE — Chronic Care Management (AMB) (Signed)
Chronic Care Management   Follow Up Note   03/12/2019 Name: Kendra [name] MRN: [id number] DOB: December 20, 1945  Referred by: Kendra College, NP Reason for referral : Chronic Care Management (Patient Phone Call)   Kendra [name] is a 73 y.o. year old female who is a primary care patient of Kendra College, NP. The CCM team was consulted for assistance with chronic disease management and care coordination needs.  Kendra [name] has a medical history which includes but is not limited to recent CVA, Type 2 Diabetes, hypertension, hyperlipidemia and COPD.  I reached out to Kendra [name] by phone today regarding her diabetes medication management and medication adherence.  Review of patient status, including review of consultants reports, relevant laboratory and other test results, and collaboration with appropriate care team members and the patient's provider was performed as part of comprehensive patient evaluation and provision of chronic care management services.    Goals Addressed            This Visit's Progress    Medication Adherence       Current Barriers:   Financial Barriers  Knowledge deficits related to coordination of her own care  Lack of blood sugar results for clinical team  Pharmacist Clinical Goal(s):   Over the next 30 days, patient will demonstrate improved medication adherence as evidenced by verbalized understanding of prescribed medication regimen, assistance available, and patient report of adherence  Over the next 30 days, patient will work with CM Pharmacist to optimize medication management.  Interventions:  Follow up with patient regarding headaches and her baclofen prescription o Reports able to pick up baclofen prescription and start medication this morning. o Caution Kendra [name] about risk of sedation and dizziness with use. She reports feeling dizzy with use this morning.  Advise patient to lay down after taking to reduce risk of  falling. o Reports improvement in headache pain; that the baclofen "eased it down some  Counsel on importance of medication adherence and blood sugar control o Reports missing evening doses of metformin a couple of times this week due to headache.  o Reports continuing to use weekly pillbox and evening phone alarm for insulin administration time to aid with adherence o Reports improved ability to log her blood sugars with new large-print log  Counsel patient on administration instructions for Miralax poweder as prescribed by PCP  Counsel on continued importance of social distancing and use of face mask due to COVID-19  Patient Self Care Activities:   Currently able to manage medications with assistance of sons, Kendra [name] and Kendra [name] o Using weekly pillbox as adherence aid o Alarm on phone set for 10 pm as reminder for insulin adminisration  Calls pharmacy for medication refills  Calls provider office for new concerns or questions   Patient to check blood sugars twice daily and blood pressure daily and keep log of each. Date Fasting Blood Glucose After Breakfast Notes  18 - June 86    19 - June 144 105   20 - June 119 115   21 - June 103    22 - June - -   23 - June - - *missed evening dose of metformin  24 - June 150      Patient to talk to family to see who might be able to help her with transportation, including assisting her with pick up of Glucerna samples from office.  Please see past updates related to this goal by clicking on the "Past  Updates" button in the selected goal          Plan  Telephone follow up appointment with care management team member scheduled for: 7/8 at 3 pm  [name], PharmD, [place] [phone]

## 2019-03-17 ENCOUNTER — Ambulatory Visit: Payer: Self-pay | Admitting: Pharmacist

## 2019-03-17 ENCOUNTER — Telehealth: Payer: Self-pay

## 2019-03-17 DIAGNOSIS — R519 Headache, unspecified: Secondary | ICD-10-CM

## 2019-03-17 NOTE — Patient Instructions (Signed)
Thank you allowing the Chronic Care Management Team to be a part of your care! It was a pleasure speaking with you today!     CCM (Chronic Care Management) Team    [name] RN, BSN Nurse Care Coordinator  [phone]   [name] PharmD  Clinical Pharmacist  [phone]   [name] LCSW Clinical Social Worker [phone]  Visit Information  Goals Addressed            This Visit's Progress   . Medication Adherence       Current Barriers:  . Financial Barriers . Knowledge deficits related to coordination of her own care . Lack of blood sugar results for clinical team  Pharmacist Clinical Goal(s):  [name] Over the next 30 days, patient will demonstrate improved medication adherence as evidenced by verbalized understanding of prescribed medication regimen, assistance available, and patient report of adherence  Over the next 30 days, patient will work with CM Pharmacist to optimize medication management.  Interventions: . Follow up with patient regarding headaches o Reports continuing to have daily headaches o Reports baclofen allows her to sleep, but headahce present again when she wakes o Reports noise and light sensitivity o States that she has also started to have issues with "staggering" when she walks. Says that this started before she started the baclofen.  o Reports that she has run out of acetaminiophen, but denies it relieving the pain . Advise patient to call office regading ongoing headache. Provide patient with phone number . Collaborate with PCP regarding patient's report of continued headaches and new report of staggering when she states occurred prior to starting baclofen. . Will collaborate with CM Social Worker, who is already working with patient, regarding patient's continued struggle with fianances, including affording medications despite having Full Extra Help.  Patient Self Care Activities:  . Currently able to manage medications with  assistance of sons, [name] and [name] o Using weekly pillbox as adherence aid o Alarm on phone set for 10 pm as reminder for insulin adminisration . Calls pharmacy for medication refills . Calls provider office for new concerns or questions  . Patient to check blood sugars twice daily and blood pressure daily and keep log of each. . Patient to talk to family to see who might be able to help her with transportation, including assisting her with pick up of Glucerna samples from office.  Please see past updates related to this goal by clicking on the "Past Updates" button in the selected goal          The patient verbalized understanding of instructions provided today and declined a print copy of patient instruction materials.   The care management team will reach out to the patient again over the next 7 days.   [name], PharmD, [place] [employer] [phone]

## 2019-03-17 NOTE — Chronic Care Management (AMB) (Addendum)
Chronic Care Management   Follow Up Note   03/17/2019 Name: MEDINA DEGRAFFENREID MRN: [id number] DOB: November 06, 1945  Referred by: [name], NP Reason for referral : Chronic Care Management (Patient Phone Call)   Kendra [name] is a 73 y.o. year old female who is a primary care patient of [name], NP. The CCM team was consulted for assistance with chronic disease management and care coordination needs.  Ms. Reinhardt has a medical history which includes but is not limited to recent CVA, Type 2 Diabetes, hypertension, hyperlipidemia and COPD.  Receive a voicemail from Ms. Blinn requesting a call back.  I reached out to [name] by phone today.   Review of patient status, including review of consultants reports, relevant laboratory and other test results, and collaboration with appropriate care team members and the patient's provider was performed as part of comprehensive patient evaluation and provision of chronic care management services.    Goals Addressed            This Visit's Progress   . Medication Adherence       Current Barriers:  . Financial Barriers . Knowledge deficits related to coordination of her own care . Lack of blood sugar results for clinical team  Pharmacist Clinical Goal(s):  [name] Over the next 30 days, patient will demonstrate improved medication adherence as evidenced by verbalized understanding of prescribed medication regimen, assistance available, and patient report of adherence  Over the next 30 days, patient will work with CM Pharmacist to optimize medication management.  Interventions: . Follow up with patient regarding headaches o Reports continuing to have daily headaches o Reports baclofen allows her to sleep, but headahce present again when she wakes o Reports noise and light sensitivity o States that she has also started to have issues with "staggering" when she walks. Says that this started before she started  the baclofen.  - Denies any low blood sugars o Reports that she has run out of acetaminiophen, but denies it relieving the pain . Advise patient to call office regading ongoing headache. Provide patient with phone number . Collaborate with [name] regarding patient's report of continued headaches and new report of staggering when she states occurred prior to starting baclofen. o [name] recommends patient follow up with her Neurologist for evaluation of headaches and balance o Call back to Ms. Arvanitis and advise her to follow up with Neurology, provide phone number to [facility] . Will collaborate with CM Social Worker, who is already working with patient, regarding patient's continued struggle with fianances, including affording medications despite having Full Extra Help.  Patient Self Care Activities:  . Currently able to manage medications with assistance of sons, [name] and [name] o Using weekly pillbox as adherence aid o Alarm on phone set for 10 pm as reminder for insulin adminisration . Calls pharmacy for medication refills . Calls provider office for new concerns or questions  . Patient to check blood sugars twice daily and blood pressure daily and keep log of each. . Patient to talk to family to see who might be able to help her with transportation, including assisting her with pick up of Glucerna samples from office.  Please see past updates related to this goal by clicking on the "Past Updates" button in the selected goal          Plan  The care management team will reach out to the patient again over the next 7 days.   [name]  Winfield Cunas, PharmD, [place] [employer] [phone]

## 2019-03-18 DIAGNOSIS — R69 Illness, unspecified: Secondary | ICD-10-CM | POA: Diagnosis not present

## 2019-03-20 ENCOUNTER — Ambulatory Visit: Payer: Self-pay | Admitting: *Deleted

## 2019-03-20 ENCOUNTER — Telehealth: Payer: Self-pay

## 2019-03-20 DIAGNOSIS — Z794 Long term (current) use of insulin: Secondary | ICD-10-CM

## 2019-03-20 DIAGNOSIS — E118 Type 2 diabetes mellitus with unspecified complications: Secondary | ICD-10-CM

## 2019-03-20 NOTE — Chronic Care Management (AMB) (Signed)
  Chronic Care Management   Outreach Note  03/20/2019 Name: Kendra [name] MRN: [id number] DOB: [birth year]  Referred by: [name], NP Reason for referral : Chronic Care Management (unsuccessful outreach )   An unsuccessful telephone outreach was attempted today. The patient was referred to the case management team by for assistance with chronic care management and care coordination.   Follow Up Plan: A HIPPA compliant phone message was left for the patient providing contact information and requesting a return call.  The care management team will reach out to the patient again over the next 30 days.      Merlene Morse Wave Calzada RN, BSN Nurse Case Pharmacist, [facility]  [phone]) Business Mobile

## 2019-03-26 ENCOUNTER — Ambulatory Visit: Payer: Medicare HMO | Admitting: Pharmacist

## 2019-03-26 ENCOUNTER — Other Ambulatory Visit: Payer: Self-pay

## 2019-03-26 DIAGNOSIS — E118 Type 2 diabetes mellitus with unspecified complications: Secondary | ICD-10-CM

## 2019-03-26 DIAGNOSIS — Z794 Long term (current) use of insulin: Secondary | ICD-10-CM

## 2019-03-26 DIAGNOSIS — F17200 Nicotine dependence, unspecified, uncomplicated: Secondary | ICD-10-CM

## 2019-03-26 NOTE — Patient Instructions (Signed)
Thank you allowing the Chronic Care Management Team to be a part of your care! It was a pleasure speaking with you today!     CCM (Chronic Care Management) Team    [name] RN, BSN Nurse Care Coordinator  [phone]   [name] PharmD  Clinical Pharmacist  [phone]   [name] LCSW Clinical Social Worker [phone]  Visit Information  Goals Addressed            This Visit's Progress   . Medication Adherence       Current Barriers:  . Financial Barriers . Knowledge deficits related to coordination of her own care . Lack of blood sugar results for clinical team  Pharmacist Clinical Goal(s):  [name] Over the next 30 days, patient will demonstrate improved medication adherence as evidenced by verbalized understanding of prescribed medication regimen, assistance available, and patient report of adherence  Over the next 30 days, patient will work with CM Pharmacist to optimize medication management.  Interventions: . Follow up with patient regarding headaches o Ms. Bartus reports some improvement in headaches, no longer having these everyday and reports having some relief with use of baclofen. o Ms. Hogenson denies having followed up with Neurology for evaluation of headaches and balance o Counsel patient on importance of follow up with Neurology, provide phone number to [facility] o Perform chart review, patient called and appointment scheduled . Counsel patient on the importance of medication adherence o Confirms currently taking her Lantus 18 units each night and metformin as directed. Denies missed doses o Reports using Trelegy inhaler once daily as directed o Reports picking up refills of her medications from pharmacy today. Speak with patient and son, [name]. State that they will sit down to refill patient's weekly pillbox this evening . Counsel on smoking cessation o Ms. Britz reports that she has cut back her smoking to ~1 cigarette/day  (smoking 1/2 first thing in the morning and then other 1/2 later in the day) o Discuss techniques for managing nicotine cravings/how to change habits o Advise patient to continue to follow up with [place] [facility] for smoking cessation. Provide phone number again . Inquire about blood pressure control o Ms. Klump reports that she has been continuing to check and maintain her BP log o BP 134/88, HR 61 today . Inquire about patient's transportation - Ms. Klinger reports that she has transportation again as [place]'s car is fixed. . Advise patient the CM Nurse Case Manager reached out to her on 7/2. Provide patient with phone number for [facility] to call back  Patient Self Care Activities:  . Currently able to manage medications with assistance of sons, [name] and [name] o Using weekly pillbox as adherence aid o Alarm on phone set for 10 pm as reminder for insulin adminisration . Calls pharmacy for medication refills . Patient to attend scheduled medical appointments o Appointment with [facility] Neurology - 05/07/19 . Calls provider office for new concerns or questions  . Patient to check blood sugars twice daily and blood pressure daily and keep log of each. Date Fasting Blood Glucose  2 - July -  3 - July 86  4 - July 109  5 - July 77  6 - July 106  7 - July 146  8 - July 105  Average 105    Please see past updates related to this goal by clicking on the "Past Updates" button in the selected goal          The  patient verbalized understanding of instructions provided today and declined a print copy of patient instruction materials.   Telephone follow up appointment with care management team member scheduled for:  7/21 at 3 pm  [name], PharmD, [place] [phone]

## 2019-03-26 NOTE — Chronic Care Management (AMB) (Signed)
Chronic Care Management   Follow Up Note   03/26/2019 Name: LEONARD HENDLER MRN: [id number] DOB: 1946-06-21  Referred by: [name], NP Reason for referral : Chronic Care Management (Patient Phone Call)   BLANCH STANG is a 73 y.o. year old female who is a primary care patient of [name], NP. The CCM team was consulted for assistance with chronic disease management and care coordination needs.  Ms. Cwikla has a medical history which includes but is not limited to recent CVA, Type 2 Diabetes, hypertension, hyperlipidemia and COPD.  I reached out to [name] by phone today regarding her diabetes medication management and medication adherence.  Review of patient status, including review of consultants reports, relevant laboratory and other test results, and collaboration with appropriate care team members and the patient's provider was performed as part of comprehensive patient evaluation and provision of chronic care management services.    Goals Addressed            This Visit's Progress   . Medication Adherence       Current Barriers:  . Financial Barriers . Knowledge deficits related to coordination of her own care . Lack of blood sugar results for clinical team  Pharmacist Clinical Goal(s):  [name] Over the next 30 days, patient will demonstrate improved medication adherence as evidenced by verbalized understanding of prescribed medication regimen, assistance available, and patient report of adherence  Over the next 30 days, patient will work with CM Pharmacist to optimize medication management.  Interventions: . Follow up with patient regarding headaches o Ms. Poss reports some improvement in headaches, no longer having these everyday and reports having some relief with use of baclofen. o Ms. Altmann denies having followed up with Neurology for evaluation of headaches and balance o Counsel patient on importance of follow up with  Neurology, provide phone number to [facility] o Perform chart review, patient called and appointment scheduled . Counsel patient on the importance of medication adherence o Confirms currently taking her Lantus 18 units each night and metformin as directed. Denies missed doses o Reports using Trelegy inhaler once daily as directed o Reports picking up refills of her medications from pharmacy today. Speak with patient and son, [name]. State that they will sit down to refill patient's weekly pillbox this evening . Counsel on smoking cessation o Ms. Guidry reports that she has cut back her smoking to ~1 cigarette/day (smoking 1/2 first thing in the morning and then other 1/2 later in the day) o Discuss techniques for managing nicotine cravings/how to change habits o Advise patient to continue to follow up with [place] [facility] for smoking cessation. Provide phone number again . Inquire about blood pressure control o Ms. Hege reports that she has been continuing to check and maintain her BP log o BP 134/88, HR 61 today . Inquire about patient's transportation - Ms. Greeson reports that she has transportation again as [place]'s car is fixed. . Advise patient the CM Nurse Case Manager reached out to her on 7/2. Provide patient with phone number for [facility] to call back  Patient Self Care Activities:  . Currently able to manage medications with assistance of sons, [name] and [name] o Using weekly pillbox as adherence aid o Alarm on phone set for 10 pm as reminder for insulin adminisration . Calls pharmacy for medication refills . Patient to attend scheduled medical appointments o Appointment with [facility] Neurology - 05/07/19 . Calls provider office for new concerns or questions  .  Patient to check blood sugars twice daily and blood pressure daily and keep log of each. Date Fasting Blood Glucose  2 - July -  3 - July 86  4 - July 109  5 - July 77  6 - July 106  7 - July 146  8 - July 105  Average 105     Please see past updates related to this goal by clicking on the "Past Updates" button in the selected goal          Plan  Telephone follow up appointment with care management team member scheduled for: 7/21 at 3 pm  [name], PharmD, [place] [phone]

## 2019-04-03 ENCOUNTER — Ambulatory Visit: Payer: Self-pay | Admitting: Licensed Clinical Social Worker

## 2019-04-03 ENCOUNTER — Other Ambulatory Visit: Payer: Self-pay

## 2019-04-03 DIAGNOSIS — Z599 Problem related to housing and economic circumstances, unspecified: Secondary | ICD-10-CM
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
## 2019-04-07 ENCOUNTER — Telehealth: Payer: Self-pay

## 2019-04-08 ENCOUNTER — Ambulatory Visit: Payer: Medicare HMO | Admitting: Pharmacist

## 2019-04-08 DIAGNOSIS — I10 Essential (primary) hypertension: Secondary | ICD-10-CM

## 2019-04-08 DIAGNOSIS — E118 Type 2 diabetes mellitus with unspecified complications: Secondary | ICD-10-CM

## 2019-04-08 DIAGNOSIS — Z794 Long term (current) use of insulin: Secondary | ICD-10-CM

## 2019-04-08 NOTE — Patient Instructions (Signed)
Thank you allowing the Chronic Care Management Team to be a part of your care! It was a pleasure speaking with you today!     CCM (Chronic Care Management) Team    [name] RN, BSN Nurse Care Coordinator  [phone]   [name] PharmD  Clinical Pharmacist  [phone]   [name] LCSW Clinical Social Worker [phone]  Visit Information  Goals Addressed            This Visit's Progress   . Medication Adherence       Current Barriers:  . Financial Barriers . Knowledge deficits related to coordination of her own care . Lack of blood sugar results for clinical team  Pharmacist Clinical Goal(s):  [name] Over the next 30 days, patient will demonstrate improved medication adherence as evidenced by verbalized understanding of prescribed medication regimen, assistance available, and patient report of adherence  Over the next 30 days, patient will work with CM Pharmacist to optimize medication management.  Interventions: . As requested, will send patient additional large print blood sugar and blood pressure logs to aid with her monitoring . Patient reports that she has been monitoring both her blood pressure and blood sugar and that both have been fine o As requested, will wait to review blood sugar numbers next week . Confirm that patient has phone number for CM Social Worker  Patient Self Care Activities:  . Currently able to manage medications with assistance of sons, [name] and [name] o Using weekly pillbox as adherence aid o Alarm on phone set for 10 pm as reminder for insulin adminisration . Calls pharmacy for medication refills . Patient to attend scheduled medical appointments o Appointment with [facility] Neurology - 05/07/19 . Calls provider office for new concerns or questions  . Patient to check blood sugars twice daily and blood pressure daily and keep log of each.   Please see past updates related to this goal by clicking on the "Past Updates" button  in the selected goal          The patient verbalized understanding of instructions provided today and declined a print copy of patient instruction materials.   Telephone follow up appointment with care management team member scheduled for: 7/30 at 2 pm  [name], PharmD, [place] [phone]

## 2019-04-08 NOTE — Chronic Care Management (AMB) (Signed)
  Chronic Care Management   Follow Up Note   04/08/2019 Name: SHIANNA BALLY MRN: [id number] DOB: 05/16/1946  Referred by: [name], NP Reason for referral : Chronic Care Management (Patient Phone Call)   KOREE STAHELI is a 73 y.o. year old female who is a primary care patient of [name], NP. The CCM team was consulted for assistance with chronic disease management and care coordination needs.  Ms. Sachs has a medical history which includes but is not limited to recent CVA, Type 2 Diabetes, hypertension, hyperlipidemia and COPD.  I reached out to [name] by phone today regarding her diabetes medication management and medication adherence.  Ms. Sculley reports that she has had two recent deaths in the family. States "I'm Okay. It just hit me all at once."   Review of patient status, including review of consultants reports, relevant laboratory and other test results, and collaboration with appropriate care team members and the patient's provider was performed as part of comprehensive patient evaluation and provision of chronic care management services.    Goals Addressed            This Visit's Progress   . Medication Adherence       Current Barriers:  . Financial Barriers . Knowledge deficits related to coordination of her own care . Lack of blood sugar results for clinical team  Pharmacist Clinical Goal(s):  [name] Over the next 30 days, patient will demonstrate improved medication adherence as evidenced by verbalized understanding of prescribed medication regimen, assistance available, and patient report of adherence  Over the next 30 days, patient will work with CM Pharmacist to optimize medication management.  Interventions: . Offer patient support as Ms. Bienaime shares about the recent loss of two family members . As requested, will send patient additional large print blood sugar and blood pressure logs to aid with her monitoring .  Patient reports that she has been monitoring both her blood pressure and blood sugar and that both have been fine o As requested, will wait to review blood sugar numbers next week . Confirm that patient has phone number for CM Social Worker . Will collaborate with CM Social Worker to let her know of patient's recent loss.  Patient Self Care Activities:  . Currently able to manage medications with assistance of sons, [name] and [name] o Using weekly pillbox as adherence aid o Alarm on phone set for 10 pm as reminder for insulin adminisration . Calls pharmacy for medication refills . Patient to attend scheduled medical appointments o Appointment with [facility] Neurology - 05/07/19 . Calls provider office for new concerns or questions  . Patient to check blood sugars twice daily and blood pressure daily and keep log of each.   Please see past updates related to this goal by clicking on the "Past Updates" button in the selected goal         Plan   Telephone follow up appointment with care management team member scheduled for: 7/30 at 2 pm The patient has been provided with contact information for the care management team and has been advised to call with any health related questions or concerns.   [name], PharmD, [place] [employer] [phone]

## 2019-04-11 ENCOUNTER — Ambulatory Visit: Payer: Self-pay | Admitting: Licensed Clinical Social Worker

## 2019-04-11 DIAGNOSIS — Z599 Problem related to housing and economic circumstances, unspecified: Secondary | ICD-10-CM

## 2019-04-11 NOTE — Chronic Care Management (AMB) (Signed)
  Chronic Care Management    Clinical Social Work Follow Up Note  04/11/2019 Name: DALIS BEERS MRN: [id number] DOB: 06/20/1946  Tresa Moore Eshleman is a 73 y.o. year old female who is a primary care patient of [name], NP. The CCM team was consulted for assistance with Grief Counseling   Review of patient status, including review of consultants reports, other relevant assessments, and collaboration with appropriate care team members and the patient's provider was performed as part of comprehensive patient evaluation and provision of chronic care management services.     Goals Addressed    . "I've lost some loved ones recently" (pt-stated)       Current Barriers:  . Financial constraints . Limited social support . ADL IADL limitations . Mental Health Concerns  . Social Isolation . Limited access to caregiver . Inability to perform ADL's independently . Inability to perform IADL's independently . Lacks knowledge of community resource: grief support resources within the area  Clinical Social Work Clinical Goal(s):  [name] Over the next 90 days, client will work with SW to address concerns related to grief since the recent loss of loved ones.   Interventions: . Patient interviewed and appropriate assessments performed . Provided patient with information about grief support resources within her area  . Discussed plans with patient for ongoing care management follow up and provided patient with direct contact information for care management team . Advised patient to consider grief therapy over the next 30 days as patient declined wanting LCSW to complete referral to [name] today on 04/11/2019 . Collaborated with CCM Pharmacist  . Assisted patient/caregiver with obtaining information about health plan benefits . LCSW used active and reflective listening and implemented appropriate interventions to help suppport patient and her emotional needs. LCSW explored pt's coping skills  and access to community services.   Patient Self Care Activities:  . Attends all scheduled provider appointments . Calls provider office for new concerns or questions  Initial goal documentation   Follow Up Plan: SW will follow up with patient by phone over the next 30 days  [name], [employer], MSW, [email or web address] Phone: [phone]

## 2019-04-14 ENCOUNTER — Telehealth: Payer: Self-pay

## 2019-04-17 ENCOUNTER — Ambulatory Visit: Payer: Self-pay | Admitting: *Deleted

## 2019-04-17 ENCOUNTER — Ambulatory Visit: Payer: Medicare HMO | Admitting: Pharmacist

## 2019-04-17 DIAGNOSIS — R519 Headache, unspecified: Secondary | ICD-10-CM

## 2019-04-17 DIAGNOSIS — Z794 Long term (current) use of insulin: Secondary | ICD-10-CM

## 2019-04-17 DIAGNOSIS — I10 Essential (primary) hypertension: Secondary | ICD-10-CM

## 2019-04-17 DIAGNOSIS — E118 Type 2 diabetes mellitus with unspecified complications: Secondary | ICD-10-CM

## 2019-04-17 NOTE — Chronic Care Management (AMB) (Signed)
  Chronic Care Management   Follow Up Note   04/17/2019 Name: Kendra [name] MRN: [id number] DOB: 10/08/1945  Referred by: Kendra College, NP Reason for referral : Chronic Care Management (Patient Phone Call)   Kendra [name] is a 73 y.o. year old female who is a primary care patient of Kendra College, NP. The CCM team was consulted for assistance with chronic disease management and care coordination needs.  Kendra [name] has a medical history which includes but is not limited to recent CVA, Type 2 Diabetes, hypertension, hyperlipidemia and COPD.  I reached out to Kendra [name] by phone today.   Review of patient status, including review of consultants reports, relevant laboratory and other test results, and collaboration with appropriate care team members and the patient's provider was performed as part of comprehensive patient evaluation and provision of chronic care management services.    Goals Addressed            This Visit's Progress   . Medication Adherence       Current Barriers:  . Financial Barriers . Knowledge deficits related to coordination of her own care . Lack of blood sugar results for clinical team  Pharmacist Clinical Goal(s):  Kendra [name] Kitchen Over the next 30 days, patient will demonstrate improved medication adherence as evidenced by verbalized understanding of prescribed medication regimen, assistance available, and patient report of adherence  Over the next 30 days, patient will work with [facility] Pharmacist to optimize medication management.  Interventions: . Inquire about how Kendra [name] is feeling today. Patient reports that her biggest concern is her ongoing headaches.  o Note patient previously seen by [facility]ra [name] for evaluation of headaches. Provider also advised patient contact her [facility]rologist at [name] for further management of these headaches. - Appointment scheduled for 8/19 o Advise patient to call back to [name] today to describe what  symptoms she is having with her headaches as well as ask about whether she could be seen sooner  . Inquire about blood sugar and blood pressure control. Reports that generally her blood sugar and blood pressure readings have been good, except reports that her blood pressure was elevated yesterday, 154/92, HR 75 (admits to having headache pain yesterday). Denies missed doses of hypertension medication. . Coordination of care call to Kendra [name] . Perform chart review: Note that patient did call [name] today and leave message regarding her current symptoms and course of headache.  Patient Self Care Activities:  . Currently able to manage medications with assistance of sons, Kendra [name] and [facility]ra [name] o Using weekly pillbox as adherence aid o Alarm on phone set for 10 pm as reminder for insulin adminisration . Calls pharmacy for medication refills . Patient to attend scheduled medical appointments o Appointment with [name] - 05/07/19 . Calls provider office for new concerns or questions  . Patient to check blood sugars twice daily and blood pressure daily and keep log of each.   Please see past updates related to this goal by clicking on the "Past Updates" button in the selected goal          Plan  Telephone follow up appointment with care management team member scheduled for: 8/3  Kendra [name], PharmD, [place] [phone]

## 2019-04-17 NOTE — Patient Instructions (Signed)
Thank you allowing the Chronic Care Management Team to be a part of your care! It was a pleasure speaking with you today!     CCM (Chronic Care Management) Team    [name] RN, BSN Nurse Care Coordinator  [phone]   [name] PharmD  Clinical Pharmacist  [phone]   [name] LCSW Clinical Social Worker [phone]  Visit Information  Goals Addressed            This Visit's Progress   . Medication Adherence       Current Barriers:  . Financial Barriers . Knowledge deficits related to coordination of her own care . Lack of blood sugar results for clinical team  Pharmacist Clinical Goal(s):  [name] Over the next 30 days, patient will demonstrate improved medication adherence as evidenced by verbalized understanding of prescribed medication regimen, assistance available, and patient report of adherence  Over the next 30 days, patient will work with CM Pharmacist to optimize medication management.  Interventions: . Inquire about how Ms. Branca is feeling today. Patient reports that her biggest concern is her ongoing headaches.  o Note patient previously seen by [name] for evaluation of headaches. Provider also advised patient contact her Neurologist at [facility] for further management of these headaches. - Appointment scheduled for 8/19 o Advise patient to call back to Neurology today to describe what symptoms she is having with her headaches as well as ask about whether she could be seen sooner  . Inquire about blood sugar and blood pressure control. Reports that generally her blood sugar and blood pressure readings have been good, except reports that her blood pressure was elevated yesterday, 154/92, HR 75 (admits to having headache pain yesterday). Denies missed doses of hypertension medication.  . Coordination of care call to CM Nurse Case Manager . Perform chart review: Note that patient did call [facility] Neurology today and leave message regarding her  current symptoms and course of headache.  Patient Self Care Activities:  . Currently able to manage medications with assistance of sons, [name] and [name] o Using weekly pillbox as adherence aid o Alarm on phone set for 10 pm as reminder for insulin adminisration . Calls pharmacy for medication refills . Patient to attend scheduled medical appointments o Appointment with [facility] Neurology - 05/07/19 . Calls provider office for new concerns or questions  . Patient to check blood sugars twice daily and blood pressure daily and keep log of each.   Please see past updates related to this goal by clicking on the "Past Updates" button in the selected goal          The patient verbalized understanding of instructions provided today and declined a print copy of patient instruction materials.   Telephone follow up appointment with care management team member scheduled for: 8/3  [name], PharmD, [facility] [phone]

## 2019-04-17 NOTE — Chronic Care Management (AMB) (Signed)
Chronic Care Management   Follow Up Note   04/17/2019 Name: Kendra [name] MRN: [id number] DOB: 1946/06/25  Referred by: [name], NP Reason for referral : Chronic Care Management   Kendra [name] is a 73 y.o. year old female who is a primary care patient of [name], NP. The CCM team was consulted for assistance with chronic disease management and care coordination needs.    Review of patient status, including review of consultants reports, relevant laboratory and other test results, and collaboration with appropriate care team members and the patient's provider was performed as part of comprehensive patient evaluation and provision of chronic care management services.    Goals Addressed            This Visit's Progress   . COMPLETED: "I want to make sure I don't have that virus" (pt-stated)       Current Barriers:  [name] Knowledge Deficits related to chronic pulmonary disease (COPD) and current COVID19 pandemic  Nurse Case Manager Clinical Goal(s):  [name] Over the next 30 days, patient will verbalize understanding of plan for long term self health management related to chronic pulmonary disease and infectious disease prevention  Interventions:  . Evaluation of current treatment plan related to COPD management and patient's adherence to plan as established by provider. . Advised patient to maintain social distance and try not to leave her home unless necessary for provider appointments.  . Provided education to patient re: COVID19 signs and symptoms vs usual symptoms (per her reported baseline) of moderate COPD.   Provided web information as requested:  . Reviewed [id number] prevention strategies.   For information about COVID-19 or "Corona Virus", the following web resources may be helpful: CDC: BeginnerSteps.be   [place]:   [email or web address]  Patient Self Care Activities:  . Performs ADL's independently . Calls provider office for new concerns or questions  Please see past updates related to this goal by clicking on the "Past Updates" button in the selected goal      . RNCM-"I need some help with my sugar" (pt-stated)       Current Barriers:  [name] Knowledge Deficits related to diabetes management  Nurse Case Manager Clinical Goal(s):  [name] Over the next 30 days, patient will verbalize understanding of plan for diabetes management . Over the next 30 days, patient will demonstrate improved health management independence as evidenced byperforming self health management activities such as checking cbg's/recording, working with CM team to address needs around chronic disease management  Interventions:  . Evaluation of current treatment plan related to diabetes management and patient's adherence to plan as established by provider. . Provided education to patient re: assistance being provided with medication management and importance of med adherence, self health management activities  . Patient reports blood sugars all under 150, continues to report headaches. . Reviewed medications with patient -Patient stated she was out of her "blue" inhaler-Will collaborate with CCM pharmacist about this . Discussed meals, patient reported adding breakfast for the past two mornings which included protein.  . Will discuss with pharmacist the feasibility of continuous CBG monitor. -discussed this would be too expensive for patient . Patient discussed she was attempting to find a new place to live, encouraged her to speak with CCM Team LCSW Brooke for resources. Continues to look for housing options  . Medication review with patient and daughter n law revealed patient had not been taking Metformin related to she thought this medication was for  "bowel movements".  Education done and collaboration with ccm Pharmacist to make her aware patient needed continued reinforcement of medication education.    Patient Self Care Activities:  . Currently UNABLE TO independently self manage diabetes . Performs ADL's independently . Calls provider office for new concerns or questions  Please see past updates related to this goal by clicking on the "Past Updates" button in the selected goal      . RNCM-My headaches are worse (pt-stated)       Current Barriers:  [name] Knowledge Deficits related to basic understanding of hypertension pathophysiology and self care management . Knowledge Deficits related to understanding of medications prescribed for management of hypertension . Non-adherence to prescribed medication regimen . Film/video editor.   Case Manager Clinical Goal(s):  [name] Over the next 30 days, patient will attend all scheduled medical appointments: PCP 04/29/19 and Neurology 05/07/19 . Over the next 90 days, patient will demonstrate improved adherence to prescribed treatment plan for hypertension as evidenced by taking all medications as prescribed, monitoring and recording blood pressure as directed, adhering to low sodium/DASH diet  Interventions:  [name] UNABLE to independently:manage increased worsening headaches . Provided education to patient re: stroke prevention, s/s of heart attack and stroke, DASH diet, complications of uncontrolled blood pressure . Reviewed medications with patient and discussed importance of compliance . Discussed plans with patient for ongoing care management follow up and provided patient with direct contact information for care management team . Advised patient, providing education and rationale, to monitor blood pressure daily and record, calling PCP for findings outside established parameters.  . Reviewed scheduled/upcoming provider appointments including:  PCP 04/29/2019 and Neurology 05/07/19 . Patient admits to  not being able to "find" her lisinipril the days b/p was higher. She and her daughter in law report she has all medications except a "blue" inhaler. And had not been taking metformin either. Patient reports headache worse on days b/p higher.Patient reports blood pressures as follows:  . 04/15/19 . 04/16/19 . 04/17/19 .  AM  . 378/58 . AM . 143/78 . AM . None .  Lunch . 115/72 . Lunch . 181/115 . Lunch . 152/95 .  PM . 151/92 . PM . 151/107 . PM  . none .    Patient Self Care Activities:  . UNABLE to independently manage worsening headaches . Checks BP and records as discussed  Initial goal documentation          The care management team will reach out to the patient again over the next 30 days.  The patient has been provided with contact information for the care management team and has been advised to call with any health related questions or concerns.    Merlene Morse Alyssa Rotondo RN, BSN Nurse Case Pharmacist, [facility]  [phone]) Business Mobile

## 2019-04-20 NOTE — Patient Instructions (Addendum)
Thank you allowing the Chronic Care Management Team to be a part of your care! It was a pleasure speaking with you today!   CCM (Chronic Care Management) Team   Floria Brandau RN, BSN Nurse Care Coordinator  [phone]  [name] PharmD  Clinical Pharmacist  [phone]  [name] LCSW Clinical Social Worker [phone]  Goals Addressed            This Visit's Progress   . COMPLETED: "I want to make sure I don't have that virus" (pt-stated)       Current Barriers:  [name] Knowledge Deficits related to chronic pulmonary disease (COPD) and current COVID19 pandemic  Nurse Case Manager Clinical Goal(s):  [name] Over the next 30 days, patient will verbalize understanding of plan for long term self health management related to chronic pulmonary disease and infectious disease prevention  Interventions:  . Evaluation of current treatment plan related to COPD management and patient's adherence to plan as established by provider. . Advised patient to maintain social distance and try not to leave her home unless necessary for provider appointments.  . Provided education to patient re: COVID19 signs and symptoms vs usual symptoms (per her reported baseline) of moderate COPD.   Provided web information as requested:  . Reviewed [phone] prevention strategies.   For information about COVID-19 or "Corona Virus", the following web resources may be helpful: CDC: BeginnerSteps.be   [place]:  InsuranceIntern.se  Patient Self Care Activities:  . Performs ADL's independently . Calls provider office for new concerns or questions  Please see past updates related to this goal by clicking on the "Past Updates" button in the selected goal      . RNCM-"I need some help with my sugar" (pt-stated)       Current Barriers:  [name] Knowledge Deficits related to  diabetes management  Nurse Case Manager Clinical Goal(s):  [name] Over the next 30 days, patient will verbalize understanding of plan for diabetes management . Over the next 30 days, patient will demonstrate improved health management independence as evidenced byperforming self health management activities such as checking cbg's/recording, working with CM team to address needs around chronic disease management  Interventions:  . Evaluation of current treatment plan related to diabetes management and patient's adherence to plan as established by provider. . Provided education to patient re: assistance being provided with medication management and importance of med adherence, self health management activities  . Patient reports blood sugars all under 150, continues to report headaches. . Reviewed medications with patient -Patient stated she was out of her "blue" inhaler-Will collaborate with CCM pharmacist about this . Discussed meals, patient reported adding breakfast for the past two mornings which included protein.  . Will discuss with pharmacist the feasibility of continuous CBG monitor. -discussed this would be too expensive for patient . Patient discussed she was attempting to find a new place to live, encouraged her to speak with CCM Team LCSW Brooke for resources. Continues to look for housing options  . Medication review with patient and daughter n law revealed patient had not been taking Metformin related to she thought this medication was for "bowel movements". Education done and collaboration with ccm Pharmacist to make her aware patient needed continued reinforcement of medication education.    Patient Self Care Activities:  . Currently UNABLE TO independently self manage diabetes . Performs ADL's independently . Calls provider office for new concerns or questions  Please see past updates related to this goal by clicking  on the "Past Updates" button in the selected goal      . RNCM-My  headaches are worse (pt-stated)       Current Barriers:  [name] Knowledge Deficits related to basic understanding of hypertension pathophysiology and self care management . Knowledge Deficits related to understanding of medications prescribed for management of hypertension . Non-adherence to prescribed medication regimen . Film/video editor.   Case Manager Clinical Goal(s):  [name] Over the next 30 days, patient will attend all scheduled medical appointments: PCP 04/29/19 and Neurology 05/07/19 . Over the next 90 days, patient will demonstrate improved adherence to prescribed treatment plan for hypertension as evidenced by taking all medications as prescribed, monitoring and recording blood pressure as directed, adhering to low sodium/DASH diet  Interventions:  [name] UNABLE to independently:manage increased worsening headaches . Provided education to patient re: stroke prevention, s/s of heart attack and stroke, DASH diet, complications of uncontrolled blood pressure . Reviewed medications with patient and discussed importance of compliance . Discussed plans with patient for ongoing care management follow up and provided patient with direct contact information for care management team . Advised patient, providing education and rationale, to monitor blood pressure daily and record, calling PCP for findings outside established parameters.  . Reviewed scheduled/upcoming provider appointments including:  PCP 04/29/2019 and Neurology 05/07/19 . Patient admits to not being able to "find" her lisinipril the days b/p was higher. She and her daughter in law report she has all medications except a "blue" inhaler. And had not been taking metformin either. Patient reports headache worse on days b/p higher.Patient reports blood pressures as follows:  . 04/15/19 . 04/16/19 . 04/17/19 .  AM  . 629/47 . AM . 143/78 . AM . None .  Lunch . 115/72 . Lunch . 181/115 . Lunch . 152/95 .  PM . 151/92 . PM . 151/107 . PM  .  none .    Patient Self Care Activities:  . UNABLE to independently manage worsening headaches . Checks BP and records as discussed  Initial goal documentation         The patient verbalized understanding of instructions provided today and declined a print copy of patient instruction materials.   The patient has been provided with contact information for the care management team and has been advised to call with any health related questions or concerns.     Stroke Prevention Some medical conditions and lifestyle choices can lead to a higher risk for a stroke. You can help to prevent a stroke by making nutrition, lifestyle, and other changes. What nutrition changes can be made?   Eat healthy foods. ? Choose foods that are high in fiber. These include:  Fresh fruits.  Fresh vegetables.  Whole grains. ? Eat at least 5 or more servings of fruits and vegetables each day. Try to fill half of your plate at each meal with fruits and vegetables. ? Choose lean protein foods. These include:  Lowfat (lean) cuts of meat.  Chicken without skin.  Fish.  Tofu.  Beans.  Nuts. ? Eat low-fat dairy products. ? Avoid foods that:  Are high in salt (sodium).  Have saturated fat.  Have trans fat.  Have cholesterol.  Are processed.  Are premade.  Follow eating guidelines as told by your doctor. These may include: ? Reducing how many calories you eat and drink each day. ? Limiting how much salt you eat or drink each day to 1,500 milligrams (mg). ? Using only healthy fats for cooking.  These include:  Olive oil.  Canola oil.  Sunflower oil. ? Counting how many carbohydrates you eat and drink each day. What lifestyle changes can be made?  Try to stay at a healthy weight. Talk to your doctor about what a good weight is for you.  Get at least 30 minutes of moderate physical activity at least 5 days a week. This can include: ? Fast walking. ? Biking. ? Swimming.  Do not  use any products that have nicotine or tobacco. This includes cigarettes and e-cigarettes. If you need help quitting, ask your doctor. Avoid being around tobacco smoke in general.  Limit how much alcohol you drink to no more than 1 drink a day for nonpregnant women and 2 drinks a day for men. One drink equals 12 oz of beer, 5 oz of wine, or 1 oz of hard liquor.  Do not use drugs.  Avoid taking birth control pills. Talk to your doctor about the risks of taking birth control pills if: ? You are over 41 years old. ? You smoke. ? You get migraines. ? You have had a blood clot. What other changes can be made?  Manage your cholesterol. ? It is important to eat a healthy diet. ? If your cholesterol cannot be managed through your diet, you may also need to take medicines. Take medicines as told by your doctor.  Manage your diabetes. ? It is important to eat a healthy diet and to exercise regularly. ? If your blood sugar cannot be managed through diet and exercise, you may need to take medicines. Take medicines as told by your doctor.  Control your high blood pressure (hypertension). ? Try to keep your blood pressure below 130/80. This can help lower your risk of stroke. ? It is important to eat a healthy diet and to exercise regularly. ? If your blood pressure cannot be managed through diet and exercise, you may need to take medicines. Take medicines as told by your doctor. ? Ask your doctor if you should check your blood pressure at home. ? Have your blood pressure checked every year. Do this even if your blood pressure is normal.  Talk to your doctor about getting checked for a sleep disorder. Signs of this can include: ? Snoring a lot. ? Feeling very tired.  Take over-the-counter and prescription medicines only as told by your doctor. These may include aspirin or blood thinners (antiplatelets or anticoagulants).  Make sure that any other medical conditions you have are managed. Where  to find more information  American Stroke Association: www.strokeassociation.org  National Stroke Association: www.stroke.org Get help right away if:  You have any symptoms of stroke. "BE FAST" is an easy way to remember the main warning signs: ? B - Balance. Signs are dizziness, sudden trouble walking, or loss of balance. ? E - Eyes. Signs are trouble seeing or a sudden change in how you see. ? F - Face. Signs are sudden weakness or loss of feeling of the face, or the face or eyelid drooping on one side. ? A - Arms. Signs are weakness or loss of feeling in an arm. This happens suddenly and usually on one side of the body. ? S - Speech. Signs are sudden trouble speaking, slurred speech, or trouble understanding what people say. ? T - Time. Time to call emergency services. Write down what time symptoms started.  You have other signs of stroke, such as: ? A sudden, very bad headache with no known cause. ?  Feeling sick to your stomach (nausea). ? Throwing up (vomiting). ? Jerky movements you cannot control (seizure). These symptoms may represent a serious problem that is an emergency. Do not wait to see if the symptoms will go away. Get medical help right away. Call your local emergency services (911 in the [place] Do not drive yourself to the hospital. Summary  You can prevent a stroke by eating healthy, exercising, not smoking, drinking less alcohol, and treating other health problems, such as diabetes, high blood pressure, or high cholesterol.  Do not use any products that contain nicotine or tobacco, such as cigarettes and e-cigarettes.  Get help right away if you have any signs or symptoms of a stroke. This information is not intended to replace advice given to you by your health care provider. Make sure you discuss any questions you have with your health care provider. Document Released: 03/05/2012 Document Revised: 10/31/2018 Document Reviewed: 12/06/2016 Elsevier Patient Education   2020 [employer].

## 2019-04-21 ENCOUNTER — Ambulatory Visit: Payer: Medicare HMO | Admitting: Pharmacist

## 2019-04-21 DIAGNOSIS — Z794 Long term (current) use of insulin: Secondary | ICD-10-CM

## 2019-04-21 DIAGNOSIS — I10 Essential (primary) hypertension: Secondary | ICD-10-CM

## 2019-04-21 DIAGNOSIS — E118 Type 2 diabetes mellitus with unspecified complications: Secondary | ICD-10-CM

## 2019-04-21 NOTE — Chronic Care Management (AMB) (Signed)
Chronic Care Management   Follow Up Note   04/21/2019 Name: Kendra [name] MRN: [id number] DOB: February 17, 1946  Referred by: [name], NP Reason for referral : Chronic Care Management (Patient Phone Call)   Kendra [name] is a 73 y.o. year old female who is a primary care patient of [name], NP. The CCM team was consulted for assistance with chronic disease management and care coordination needs.  Kendra [name] has a medical history which includes but is not limited to recent CVA, Type 2 Diabetes, hypertension, hyperlipidemia and COPD.  I reached out to [name] by phone today.   Review of patient status, including review of consultants reports, relevant laboratory and other test results, and collaboration with appropriate care team members and the patient's provider was performed as part of comprehensive patient evaluation and provision of chronic care management services.    Goals Addressed            This Visit's Progress   . Medication Adherence       Current Barriers:  . Financial Barriers . Knowledge deficits related to coordination of her own care . Lack of blood sugar results for clinical team  Pharmacist Clinical Goal(s):  [name] Over the next 30 days, patient will demonstrate improved medication adherence as evidenced by verbalized understanding of prescribed medication regimen, assistance available, and patient report of adherence  Over the next 30 days, patient will work with CM Pharmacist to optimize medication management.  Interventions: . Perform chart review o Note that patient received a call back from [facility] on 7/31 and provider advised patient: 1) informed that taking medications such as Tylenol, Meloxicam, ibuprofen, and Aleve more than 2-3 times a week can cause a rebound headache. 2) To stay well hydrated and drink plenty of water. 3) Encouraged to try and get restful sleep and reduce her stress.  4) To help with head  and neck pain, suggested she could try applying a warm compress on the right side of her neck for 10-15 minutes and then apply a topical analgesic such as Biofreeze on the painful area. She could do this 2-3 times a day.  5) Advised to reduce or stop cigarette smoking. Haywood Pao about how Kendra [name] is feeling. Patient reports "I am feeling a whole lot better". Reports improvement of headaches, having only a "a little bit of a headache this morning" o Reports that she is following instructions as provided by Neurologist, particularly staying hydrated, limiting over the counter pain-reliever use and trying to get better sleep . Perform medication review o [name] reports that she had misplaced some of her medications earlier last week for a couple of days, but that she now has found everything except needs refills/more of: albuterol inhaler, aspirin and Vitamin D o Confirms that she is now taking her lisinopril. She is not sure when she restarted, but that she is certain that she has been taking it for the last two days at least. o Reports that she restarted her metformin ER 500 mg on Wednesday, currently taking it just once daily. Reports that she is planning to increase it back up to twice daily to take as directed. o Denies having missed any doses of insulin glargine, currently reports taking 18 units each night at bedtime. [name] on importance of medication adherence and blood pressure and blood sugar monitoring o Reports that she is using her weekly pillbox. Reports that her daughter-in-law, [name] has been helping her with  filling this as her son, [name] has recently been in jail.  o Review recent blood sugars with patient (see below) - Denies any low blood sugars o Reports that her blood pressure on yesterday was 134/78, HR 90 in the afternoon, but has not yet checked today. . Counsel patient on Medicare Part D coverage o Reports that she changed her Medicare coverage on Friday,  effective 8/1 from [place] to [employer] o Reports that she is waiting on her new card to come in the mail. . Outreach to patient's current [facility]. Speak with technician who confirms that patient currently has prescriptions for albuterol inhaler and insulin glargine ready for pick up o Assist technician with look up and entering patient's information for new Part D coverage for future refills . Call back to Ms. Emmerich to let her know that her albuterol and insulin glargine prescriptions are ready. Remind her to also pick up aspirin and Vitamin D from the store.  Patient Self Care Activities:  . Currently able to manage medications with assistance of sons, [name] and [name], and daughter-in-law, [name] o Using weekly pillbox as adherence aid o Alarm on phone set for 10 pm as reminder for insulin adminisration . Calls pharmacy for medication refills . Patient to attend scheduled medical appointments o Appointment with [facility] - 05/07/19 . Calls provider office for new concerns or questions  . Patient to check blood sugars twice daily and blood pressure daily and keep log of each. Date Fasting Blood Glucose After Breakfast Before Lunch After Lunch Notes  28 - July 102  110 158   29 - July 122 112   *Restarted metformin  30 - July - - - -   31 - July 107 116     1 - August - - - -   2 - August   151 189   3 - August - -       Please see past updates related to this goal by clicking on the "Past Updates" button in the selected goal          Plan  The care management team will reach out to the patient again over the next 14 days.   [name], PharmD, [place] [employer] [phone]

## 2019-04-21 NOTE — Patient Instructions (Signed)
Thank you allowing the Chronic Care Management Team to be a part of your care! It was a pleasure speaking with you today!     CCM (Chronic Care Management) Team    [name] RN, BSN Nurse Care Coordinator  [phone]   [name] PharmD  Clinical Pharmacist  [phone]   [name] LCSW Clinical Social Worker [phone]  Visit Information  Goals Addressed            This Visit's Progress   . Medication Adherence       Current Barriers:  . Financial Barriers . Knowledge deficits related to coordination of her own care . Lack of blood sugar results for clinical team  Pharmacist Clinical Goal(s):  [name] Over the next 30 days, patient will demonstrate improved medication adherence as evidenced by verbalized understanding of prescribed medication regimen, assistance available, and patient report of adherence  Over the next 30 days, patient will work with CM Pharmacist to optimize medication management.  Interventions: . Perform chart review . Note that patient received a call back from [facility] on 7/31  . Inquire about how Ms. Winiarski is feeling. Patient reports "I am feeling a whole lot better". Reports improvement of headaches, having only a "a little bit of a headache this morning" o Reports that she is following instructions as provided by Neurologist, particularly staying hydrated, limiting over the counter pain-reliever use and trying to get better sleep . Perform medication review o Ms. Tullo reports that she had misplaced some of her medications earlier last week for a couple of days, but that she now has found everything except needs refills/more of: albuterol inhaler, aspirin and Vitamin D o Confirms that she is now taking her lisinopril. She is not sure when she restarted, but that she is certain that she has been taking it for the last two days at least. o Reports that she restarted her metformin ER 500 mg on Wednesday, currently taking it  just once daily. Reports that she is planning to increase it back up to twice daily to take as directed. o Denies having missed any doses of insulin glargine, currently reports taking 18 units each night at bedtime. [name] on importance of medication adherence and blood pressure and blood sugar monitoring o Reports that she is using her weekly pillbox. Reports that her daughter-in-law, [name] has been helping her with filling this o Review recent blood sugars with patient (see below) - Denies any low blood sugars o Reports that her blood pressure on yesterday was 134/78, HR 90 in the afternoon, but has not yet checked today. . Counsel patient on Medicare Part D coverage o Reports that she changed her Medicare coverage on Friday, effective 8/1 from [place] to [employer] o Reports that she is waiting on her new card to come in the mail. . [facility]. Speak with technician who confirms that patient currently has prescriptions for albuterol inhaler and insulin glargine ready for pick up o Assist technician with look up and entering patient's information for new Part D coverage for future refills . Call back to Ms. Faul to let her know that her albuterol and insulin glargine prescriptions are ready. Remind her to also pick up aspirin and Vitamin D from the store.  Patient Self Care Activities:  . Currently able to manage medications with assistance of sons, [name] and [name], and daughter-in-law, [name] o Using weekly pillbox as adherence aid o Alarm on phone set for  10 pm as reminder for insulin adminisration . Calls pharmacy for medication refills . Patient to attend scheduled medical appointments o Appointment with [facility] - 05/07/19 . Calls provider office for new concerns or questions  . Patient to check blood sugars twice daily and blood pressure daily and keep log of each. Date Fasting Blood Glucose After Breakfast Before Lunch After Lunch Notes   28 - July 102  110 158   29 - July 122 112   *Restarted metformin  30 - July - - - -   31 - July 107 116     1 - August - - - -   2 - August   151 189   3 - August - -       Please see past updates related to this goal by clicking on the "Past Updates" button in the selected goal          The patient verbalized understanding of instructions provided today and declined a print copy of patient instruction materials.   The care management team will reach out to the patient again over the next 14 days.   [name], PharmD, [employer] [phone]

## 2019-04-22 ENCOUNTER — Ambulatory Visit: Payer: Self-pay | Admitting: Licensed Clinical Social Worker

## 2019-04-22 DIAGNOSIS — Z599 Problem related to housing and economic circumstances, unspecified: Secondary | ICD-10-CM

## 2019-04-22 NOTE — Chronic Care Management (AMB) (Signed)
  Chronic Care Management    Clinical Social Work Follow Up Note  04/22/2019 Name: Kendra [name] MRN: [id number] DOB: September 03, 1946  Kendra [name] is a 73 y.o. year old female who is a primary care patient of [name], NP. The CCM team was consulted for assistance with Level of Care Concerns.   Review of patient status, including review of consultants reports, other relevant assessments, and collaboration with appropriate care team members and the patient's provider was performed as part of comprehensive patient evaluation and provision of chronic care management services.     Goals Addressed    . I probably could use some extra support right now. (pt-stated)       Current Barriers:  . Financial constraints . Limited social support . ADL IADL limitations . Mental Health Concerns  . Social Isolation . Limited access to caregiver . Inability to perform ADL's independently . Inability to perform IADL's independently . Lacks knowledge of community resource: grief support resources within the area  Clinical Social Work Clinical Goal(s):  [name] Over the next 90 days, client will work with SW to address concerns related to grief since the recent loss of loved ones.   Interventions: . Patient interviewed and appropriate assessments performed . Education provided on how to implement appropriate self-care and coping tools into her daily routine. Patient was receptive to this education. . CCM team had concerns about patient's home situation as it seems very stressful at times. LCSW asked if patient could walk to a room where she could speak alone and in private. Patient denies any conflict with DIL Rhonda. She reports being safe and well cared for in the home. She denies needing APS involvement. She denies any abuse/neglect at this time. Patient reports being "just fine." LCSW will continue to re-assess this concern in the future.  . Provided patient with information about grief support  resources within her area  . Discussed plans with patient for ongoing care management follow up and provided patient with direct contact information for care management team . Advised patient to consider grief therapy over the next 30 days as patient declined wanting LCSW to complete referral to [facility] today on 04/11/2019 and on 04/22/2019.  [name] Collaborated with CCM Pharmacist and CCM Nursse . Assisted patient/caregiver with obtaining information about health plan benefits . LCSW used active and reflective listening and implemented appropriate interventions to help suppport patient and her emotional needs.  Patient Self Care Activities:  . Attends all scheduled provider appointments . Calls provider office for new concerns or questions  Please see past updates related to this goal by clicking on the "Past Updates" button in the selected goal      Follow Up Plan: SW will follow up with patient by phone over the next month  Kendra [name], Kendra [name], MSW, [name] Phone: [phone]

## 2019-04-28 ENCOUNTER — Ambulatory Visit (INDEPENDENT_AMBULATORY_CARE_PROVIDER_SITE_OTHER): Payer: Medicare Other | Admitting: Pharmacist

## 2019-04-28 DIAGNOSIS — Z794 Long term (current) use of insulin: Secondary | ICD-10-CM

## 2019-04-28 DIAGNOSIS — I10 Essential (primary) hypertension: Secondary | ICD-10-CM

## 2019-04-28 DIAGNOSIS — E118 Type 2 diabetes mellitus with unspecified complications: Secondary | ICD-10-CM | POA: Diagnosis not present

## 2019-04-28 NOTE — Chronic Care Management (AMB) (Addendum)
Chronic Care Management   Follow Up Note   04/28/2019 Name: Kendra [name] MRN: [id number] DOB: 12-31-1945  Referred by: Kendra [name] Reason for referral : Chronic Care Management (Patient Phone Call)   Kendra [name] is a 73 y.o. year old female who is a primary care patient of Kendra [name]. The CCM team was consulted for assistance with chronic disease management and care coordination needs. [name] has a medical history which includes but is not limited to recent CVA, Type 2 Diabetes, hypertension, hyperlipidemia and COPD.  I reached out to Kendra [name] by phone today.   Review of patient status, including review of consultants reports, relevant laboratory and other test results, and collaboration with appropriate care team members and the patient's provider was performed as part of comprehensive patient evaluation and provision of chronic care management services.    Goals Addressed            This Visit's Progress   . Medication Adherence       Current Barriers:  . Financial Barriers . Knowledge deficits related to coordination of her own care . Limited social support  Pharmacist Clinical Goal(s):  [name] Over the next 30 days, patient will demonstrate improved medication adherence as evidenced by verbalized understanding of prescribed medication regimen, assistance available, and patient report of adherence  Over the next 30 days, patient will work with CM Pharmacist to optimize medication management.  Interventions: . Counsel on importance of blood sugar control o Review recent blood sugar results with patient (see below) o Counseling on consistent blood sugar monitoring (for post-meal monitoring, checking ~2 hours after meal) . Counsel on importance of blood pressure control o Review blood pressure results with patient (see below) o Denies missed doses of lisinopril 2.5 mg daily o Reports feels like her stress from her living  situation is contributing to her recent blood pressure elevation  - Note patient has been working with Kendra [name]  - Note patient previously taking citalopram 40 mg once daily as prescribed by PCP, but per 6/1 note from Kendra [name], patient self-discontinued the medication as she felt the medication was causing nausea/vomiting.  . Encourage patient to follow up with PCP regarding current stress and mood, as well as her blood pressure management o Note patient canceled PCP visit scheduled for tomorrow. Encourage patient to call office back to reschedule appointment with PCP. [name] Counsel on importance of medication adherence. Speak with patient and son, Kendra [name] o [name] confirms having picked up refills of her albuterol inhaler, metformin as well as aspririn 81 mg from over the counter o [name] reports Kendra [name] assisted her with filling her weekly pillbox last week  o Patient consistently reports that she has been taking her Basaglar 18 units QHS. However, difficult to get consistent report from patient and son regarding whether she has been taking metformin 500 mg once or twice daily o Request to review pillbox with Kendra [name]. Kendra [name] asks that I call back in 1 hour. When I call back, Kendra [name] is no longer home. . Counseling regarding new Medicare Part D plan o [name] reports having received new Medicare card. Confirms new plan is Kendra [name] o Review new pill packaging pharmacy options available based on new plan information - [name] contacts Kendra [name] to get setup for pill packaging and delivery of medications . Pharmacy to contact patient back with next steps after transferring her medications from [facility]  Patient Self Care Activities:  . Currently UNABLE to consistently manage medications. Receives some assistance from sons, Kendra [name] and Kendra [name], with managing medications o Using weekly pillbox as adherence aid o Alarm on phone set for 10 pm as reminder  for insulin adminisration . Calls pharmacy for medication refills . Patient to attend scheduled medical appointments o Appointment with [name] - 05/07/19 . Calls provider office for new concerns or questions  . Patient to check blood sugars twice daily and blood pressure daily and keep log of each. Date Fasting Blood Glucose After Breakfast Before Lunch After Lunch  4 - August  153  188  5 - August  154    6 - August - -    7 - August 80     8 - August      9 - August 116     10 - August 127 120     Date Blood Pressure  5 - August 141/85, HR 81  6 - August 135/87, HR 65  7 - August 160/100, HR 75  8 - August 144/90, HR 61  9 - August 161/101, HR 84  10 - August 154/90, HR 71   Please see past updates related to this goal by clicking on the "Past Updates" button in the selected goal          Plan  The care management team will reach out to the patient again over the next 7 days.   Kendra [name], PharmD, [place] [employer] [phone]

## 2019-04-28 NOTE — Patient Instructions (Signed)
Thank you allowing the Chronic Care Management Team to be a part of your care! It was a pleasure speaking with you today!     CCM (Chronic Care Management) Team    [name] RN, BSN Nurse Care Coordinator  [phone]   [name] PharmD  Clinical Pharmacist  [phone]   [name] LCSW Clinical Social Worker [phone]  Visit Information  Goals Addressed            This Visit's Progress   . Medication Adherence       Current Barriers:  . Financial Barriers . Knowledge deficits related to coordination of her own care . Limited social support  Pharmacist Clinical Goal(s):  [name] Over the next 30 days, patient will demonstrate improved medication adherence as evidenced by verbalized understanding of prescribed medication regimen, assistance available, and patient report of adherence  Over the next 30 days, patient will work with CM Pharmacist to optimize medication management.  Interventions: . Counsel on importance of blood sugar control o Review recent blood sugar results with patient (see below) o Counsel on consistent blood sugar monitoring . Counsel on importance of blood pressure control o Review recent blood pressure results with patient (see below) o Denies missed doses of lisinopril 2.5 mg daily  . Encourage patient to follow up with PCP regarding current stress and mood, as well as her blood pressure management o Note patient canceled PCP visit scheduled for tomorrow. Encourage patient to call office back to reschedule appointment with PCP. [name] Counsel on importance of medication adherence. Speak with patient and son, [name] o Ms. Rooks confirms having picked up refills of her albuterol inhaler, metformin as well as aspririn 81 mg from over the counter o Ms. Constantine reports [place] assisted her with filling her weekly pillbox last week  o Patient consistently reports that she has been taking her Basaglar 18 units QHS.  [name] Counseling  regarding new Medicare Part D plan o Ms. Michaelson reports having received new Medicare card. Confirms new plan is [employer] Dual Complete o Review new pill packaging pharmacy options available based on new plan information - Ms. [place] contacts [facility] to get setup for pill packaging and delivery of medications . Pharmacy to contact patient back with next steps after transferring her medications from [facility] . Collaborate with CM Nurse Case Manager and CM Social Worker  Patient Self Care Activities:  . Currently UNABLE to consistently manage medications. Receives some assistance from sons, [place] and [name], with managing medications o Using weekly pillbox as adherence aid o Alarm on phone set for 10 pm as reminder for insulin adminisration . Calls pharmacy for medication refills . Patient to attend scheduled medical appointments o Appointment with [facility] - 05/07/19 . Calls provider office for new concerns or questions  . Patient to check blood sugars twice daily and blood pressure daily and keep log of each. Date Fasting Blood Glucose After Breakfast Before Lunch After Lunch  4 - August  153  188  5 - August  154    6 - August - -    7 - August 80     8 - August      9 - August 116     10 - August 127 120     Date Blood Pressure  5 - August 141/85, HR 81  6 - August 135/87, HR 65  7 - August 160/100, HR 75  8 - August 144/90, HR 61  9 -  August 161/101, HR 84  10 - August 154/90, HR 71   Please see past updates related to this goal by clicking on the "Past Updates" button in the selected goal          The patient verbalized understanding of instructions provided today and declined a print copy of patient instruction materials.   The care management team will reach out to the patient again over the next 7 days.   [name], PharmD, [place] [employer] [phone]

## 2019-04-29 ENCOUNTER — Ambulatory Visit: Payer: Medicare HMO | Admitting: Nurse Practitioner

## 2019-04-30 ENCOUNTER — Encounter: Payer: Self-pay | Admitting: Nurse Practitioner

## 2019-04-30 ENCOUNTER — Telehealth: Payer: Self-pay

## 2019-04-30 ENCOUNTER — Other Ambulatory Visit: Payer: Self-pay

## 2019-04-30 ENCOUNTER — Ambulatory Visit (INDEPENDENT_AMBULATORY_CARE_PROVIDER_SITE_OTHER): Payer: Medicare Other | Admitting: Nurse Practitioner

## 2019-04-30 DIAGNOSIS — F3341 Major depressive disorder, recurrent, in partial remission: Secondary | ICD-10-CM

## 2019-04-30 DIAGNOSIS — M19011 Primary osteoarthritis, right shoulder: Secondary | ICD-10-CM

## 2019-04-30 MED ORDER — BUPROPION HCL ER (XL) 150 MG PO TB24: 150.0000 mg | ORAL_TABLET | Freq: Every day | ORAL | 5 refills | Status: DC

## 2019-04-30 MED ORDER — MELOXICAM 15 MG PO TABS: 15.0000 mg | ORAL_TABLET | Freq: Every day | ORAL | 1 refills | Status: DC | PRN

## 2019-04-30 NOTE — Progress Notes (Signed)
Telemedicine Encounter: Disclosed to patient at start of encounter that we will provide appropriate telemedicine services.  Patient consents to be treated via phone prior to discussion. - Patient is at her home and is accessed via telephone. - Services are provided by [name] from [facility].  Subjective:    Patient ID: Kendra [name], female    DOB: 01/11/1946, 73 y.o.   MRN: [id number]  Kendra [name] is a 73 y.o. female presenting on 04/30/2019 for Depression (mood )  HPI Mood, Depression and Anxiety Patient doesn't feel like doing anything but sleep.  Feels tired all the time.  Housework and staying busy are helping her moods to improve some since June.  Citalopram caused possible nausea/vomiting so patient stopped taking this.   - Neighbors have calmed down some compared to prior.  Patient notes she is getting better sleep since it is quieter.  - Patient is still having a good appetite, sometimes eats too much.   Diabetes Patient states she has had some high sugars, reports one higher fasting cbg on 7/29 = 129 prior to eating  No sugars less than 70.  No sugars higher than 150 in am.  Reassured patient this is normal/expected.  Most should be less than 120, but occasional cbg higher is okay.  Depression screen [facility] 2/9 04/30/2019 03/05/2019 05/07/2018 01/04/2018 06/08/2017  Decreased Interest 3 2 2  0 0  Down, Depressed, Hopeless 2 2 3 2 3   PHQ - 2 Score 5 4 5 2 3   Altered sleeping 2 1 3 2 3   Tired, decreased energy 1 2 3 3 3   Change in appetite 0 1 0 0 1  Feeling bad or failure about yourself  0 2 2 0 2  Trouble concentrating 0 3 3 0 2  Moving slowly or fidgety/restless 1 2 3  0 1  Suicidal thoughts 0 1 0 0 0  PHQ-9 Score 9 16 19 7 15   Difficult doing work/chores Not difficult at all Not difficult at all Not difficult at all Not difficult at all Somewhat difficult   GAD 7 : Generalized Anxiety Score 04/30/2019 02/17/2019  Nervous, Anxious, on Edge 0 (No  Data)  Control/stop worrying 0 -  Worry too much - different things 2 -  Trouble relaxing 0 -  Restless 1 -  Easily annoyed or irritable 3 -  Afraid - awful might happen 0 -  Total GAD 7 Score 6 -  Anxiety Difficulty Not difficult at all -    Social History   Tobacco Use  . Smoking status: Current Some Day Smoker    Packs/day: 0.25    Types: Cigarettes  . Smokeless tobacco: Never Used  Substance Use Topics  . Alcohol use: No  . Drug use: No    Review of Systems Per HPI unless specifically indicated above     Objective:    There were no vitals taken for this visit.  Wt Readings from Last 3 Encounters:  09/30/18 153 lb 9.6 oz (69.7 kg)  07/31/18 163 lb (73.9 kg)  05/07/18 163 lb 6.4 oz (74.1 kg)    Physical Exam Patient remotely monitored.  Verbal communication appropriate.  Cognition normal.   Results for orders placed or performed in visit on 09/30/18  Lipid panel  Result Value Ref Range   Cholesterol 122 <200 mg/dL   HDL 39 (L) >50 mg/dL   Triglycerides 142 <150 mg/dL   LDL Cholesterol (Calc) 60 mg/dL (calc)   Total CHOL/HDL Ratio  3.1 <5.0 (calc)   Non-HDL Cholesterol (Calc) 83 <130 mg/dL (calc)  COMPLETE METABOLIC PANEL WITH GFR  Result Value Ref Range   Glucose, Bld 100 (H) 65 - 99 mg/dL   BUN 18 7 - 25 mg/dL   Creat 0.80 0.60 - 0.93 mg/dL   GFR, Est Non African American 75 > OR = 60 mL/min/1.44m2   GFR, Est African American 87 > OR = 60 mL/min/1.73m2   BUN/Creatinine Ratio NOT APPLICABLE 6 - 22 (calc)   Sodium 140 135 - 146 mmol/L   Potassium 4.3 3.5 - 5.3 mmol/L   Chloride 105 98 - 110 mmol/L   CO2 29 20 - 32 mmol/L   Calcium 9.9 8.6 - 10.4 mg/dL   Total Protein 6.9 6.1 - 8.1 g/dL   Albumin 4.4 3.6 - 5.1 g/dL   Globulin 2.5 1.9 - 3.7 g/dL (calc)   AG Ratio 1.8 1.0 - 2.5 (calc)   Total Bilirubin 0.6 0.2 - 1.2 mg/dL   Alkaline phosphatase (APISO) 57 33 - 130 U/L   AST 14 10 - 35 U/L   ALT 15 6 - 29 U/L  POCT glycosylated hemoglobin (Hb A1C)   Result Value Ref Range   Hemoglobin A1C 7.8 (A) 4.0 - 5.6 %   HbA1c POC (<> result, manual entry)     HbA1c, POC (prediabetic range)     HbA1c, POC (controlled diabetic range)        Assessment & Plan:   Problem List Items Addressed This Visit      Other   Clinical depression - Primary   Relevant Medications   buPROPion (WELLBUTRIN XL) 150 MG 24 hr tablet      Worsening depression and mild anxiety.  Patient with history of both on citalopram previously, stopped due to intolerance.  Patient now with continued uncontrolled depression with anhedonia and hypersomnia.  Indicated to be on medications.  Plan: 1. Start Wellbutrin 150 mg 24 hr tablet once daily.   - Can consider future change to 150 mg SR bid for smoking cessation if patient tolerates and has results with improved moods. - Reviewed patient can still have some nausea especially when starting med - take med with food. 2. Encouraged non-pharm treatments of hobbies, reading, staying occupied. 3. Follow-up 4 weeks in clinic with diabetes review. - Needs labs at that time.  Meds ordered this encounter  Medications  . buPROPion (WELLBUTRIN XL) 150 MG 24 hr tablet    Sig: Take 1 tablet (150 mg total) by mouth daily.    Dispense:  30 tablet    Refill:  5    Order Specific Question:   Supervising Provider    Answer:   Olin Hauser [[id number]]    - Time spent in direct consultation with patient via telemedicine about above concerns: 9 minutes  Follow up plan: Return in about 4 weeks (around 05/28/2019) for diabetes, a1c.  [name], DNP, AGPCNP-BC Adult Gerontology Primary Care Nurse Practitioner [facility] 04/30/2019, 10:11 AM

## 2019-04-30 NOTE — Telephone Encounter (Signed)
Please send a script over to [facility] for Meloxicam because this prescription did not transfer over from her previous pharmacy.

## 2019-05-05 ENCOUNTER — Other Ambulatory Visit: Payer: Self-pay | Admitting: Family Medicine

## 2019-05-05 ENCOUNTER — Ambulatory Visit: Payer: Self-pay | Admitting: Pharmacist

## 2019-05-05 DIAGNOSIS — F3341 Major depressive disorder, recurrent, in partial remission: Secondary | ICD-10-CM

## 2019-05-05 DIAGNOSIS — R519 Headache, unspecified: Secondary | ICD-10-CM

## 2019-05-05 NOTE — Chronic Care Management (AMB) (Signed)
Chronic Care Management   Follow Up Note   05/05/2019 Name: Kendra [name] MRN: [id number] DOB: Apr 20, 1946  Referred by: Kendra College, NP Reason for referral : No chief complaint on file.   Kendra [name] is a 73 y.o. year old female who is a primary care patient of Kendra College, NP. The CCM team was consulted for assistance with chronic disease management and care coordination needs.  Kendra [name] has a medical history which includes but is not limited to recent CVA, Type 2 Diabetes, hypertension, hyperlipidemia and COPD.  I reached out to Kendra [name] by phone today.   Review of patient status, including review of consultants reports, relevant laboratory and other test results, and collaboration with appropriate care team members and the patient's provider was performed as part of comprehensive patient evaluation and provision of chronic care management services.    Goals Addressed            This Visit's Progress   . Medication Adherence       Current Barriers:  . Financial Barriers . Knowledge deficits related to coordination of her own care . Limited social support  Pharmacist Clinical Goal(s):  [name] Over the next 30 days, patient will demonstrate improved medication adherence as evidenced by verbalized understanding of prescribed medication regimen, assistance available, and patient report of adherence  Interventions: . Perform chart review o Kendra [name] seen by PCP for telemedicine visit 8/12 for depression (mood). Patient to start Wellbutrin XL 150 mg tablet once daily (sent to [facility]) . Follow up with Kendra [name] regarding status of pharmacy transition/start of pill packaging and delivery services from Kendra [name] o Kendra [name] reports that she has not yet picked up/received Wellbutrin o Reports that she spoke with Kendra [name] on Friday and is expecting to receive her medications in the mail, but unsure when . Place  coordination of care call to Kendra [name] states he has the patient's prescriptions all transferred from [place] and confirms receipt of new Rx for Wellbutrin XL 150 mg daily from PCP. States that he will call patient today to initiate delivery service. . Also advise patient to reach out to [name] to coordinate start of delivery of her medications, including new Wellbutrin Rx . Follow up with Kendra [name] regarding upcoming [name] appointment on 8/19. Confirm patient has phone number for Kendra [name]. States that she needs to call health plan for transportation arrangements. Advise patient to call now as appointment is in 48 hours. o Receive call back from Kendra [name] -reports she was able to schedule her transportation through her health plan and connected with Kendra [name] to arrange for medicaiton delivery  Patient Self Care Activities:  . Currently UNABLE to consistently manage medications. Receives some assistance from sons, Kendra [name] and Kendra [name], with managing medications o Using weekly pillbox as adherence aid o Alarm on phone set for 10 pm as reminder for insulin adminisration . Calls pharmacy for medication refills . Patient to attend scheduled medical appointments o Appointment with [name] - 8/19 o Appointment with PCP - 9/2 . Calls provider office for new concerns or questions  . Patient to check blood sugars twice daily and blood pressure daily and keep log of each.   Please see past updates related to this goal by clicking on the "Past Updates" button in the selected goal          Plan  The care management team will reach out to the patient again over the next 14 days.   [name], PharmD, [place] [employer] [phone]

## 2019-05-05 NOTE — Patient Instructions (Signed)
Thank you allowing the Chronic Care Management Team to be a part of your care! It was a pleasure speaking with you today!     CCM (Chronic Care Management) Team    [name] RN, BSN Nurse Care Coordinator  [phone]   [name] PharmD  Clinical Pharmacist  [phone]   [name] LCSW Clinical Social Worker [phone]  Visit Information  Goals Addressed            This Visit's Progress   . Medication Adherence       Current Barriers:  . Financial Barriers . Knowledge deficits related to coordination of her own care . Limited social support  Pharmacist Clinical Goal(s):  [name] Over the next 30 days, patient will demonstrate improved medication adherence as evidenced by verbalized understanding of prescribed medication regimen, assistance available, and patient report of adherence  Interventions: . Perform chart review o Ms. Brun seen by PCP for telemedicine visit 8/12 for depression (mood). Patient to start Wellbutrin XL 150 mg tablet once daily (sent to [facility]) . Follow up with Ms. Beahm regarding status of pharmacy transition/start of pill packaging and delivery services from [facility] o Ms. Budzinski reports that she has not yet picked up/received Wellbutrin o Reports that she spoke with [facility] on Friday and is expecting to receive her medications in the mail, but unsure when . Place coordination of care call to [place] states he has the patient's prescriptions all transferred from [facility] and confirms receipt of new Rx for Wellbutrin XL 150 mg daily from PCP. States that he will call patient today to initiate delivery service. . Also advise patient to reach out to [name] to coordinate start of delivery of her medications, including new Wellbutrin Rx . Follow up with Ms. Fosco regarding upcoming Neurology appointment on 8/19. Confirm patient has phone number for [name]. States  that she needs to call health plan for transportation arrangements. Advise patient to call now as appointment is in 48 hours. o Receive call back from Ms. Ekdahl -reports she was able to schedule her transportation through her health plan and connected with [facility] to arrange for medicaiton delivery  Patient Self Care Activities:  . Currently UNABLE to consistently manage medications. Receives some assistance from sons, [place] and [name], with managing medications o Using weekly pillbox as adherence aid o Alarm on phone set for 10 pm as reminder for insulin adminisration . Calls pharmacy for medication refills . Patient to attend scheduled medical appointments o Appointment with [facility]rology - 8/19 o Appointment with PCP - 9/2 . Calls provider office for new concerns or questions  . Patient to check blood sugars twice daily and blood pressure daily and keep log of each.   Please see past updates related to this goal by clicking on the "Past Updates" button in the selected goal          The patient verbalized understanding of instructions provided today and declined a print copy of patient instruction materials.   The care management team will reach out to the patient again over the next 14 days.   [name], PharmD, [place] [employer] [phone]

## 2019-05-08 ENCOUNTER — Other Ambulatory Visit: Payer: Self-pay | Admitting: Nurse Practitioner

## 2019-05-08 ENCOUNTER — Ambulatory Visit: Payer: Medicare Other | Admitting: Licensed Clinical Social Worker

## 2019-05-08 ENCOUNTER — Other Ambulatory Visit: Payer: Self-pay

## 2019-05-08 ENCOUNTER — Encounter: Payer: Self-pay | Admitting: Nurse Practitioner

## 2019-05-08 DIAGNOSIS — F3341 Major depressive disorder, recurrent, in partial remission: Secondary | ICD-10-CM
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
## 2019-05-08 NOTE — Telephone Encounter (Signed)
Pt called requesting refill on glipizide 5 mg called into [facility]

## 2019-05-08 NOTE — Telephone Encounter (Signed)
I contacted the pt and the pharmacy and notified the patient that she no longer should be taking Glipizide. They both verbalize understanding.

## 2019-05-12 ENCOUNTER — Ambulatory Visit: Payer: Self-pay | Admitting: *Deleted

## 2019-05-12 ENCOUNTER — Ambulatory Visit: Payer: Self-pay | Admitting: Pharmacist

## 2019-05-12 DIAGNOSIS — J449 Chronic obstructive pulmonary disease, unspecified: Secondary | ICD-10-CM | POA: Diagnosis not present

## 2019-05-12 DIAGNOSIS — E118 Type 2 diabetes mellitus with unspecified complications: Secondary | ICD-10-CM

## 2019-05-12 DIAGNOSIS — I10 Essential (primary) hypertension: Secondary | ICD-10-CM | POA: Diagnosis not present

## 2019-05-12 DIAGNOSIS — Z794 Long term (current) use of insulin: Secondary | ICD-10-CM

## 2019-05-12 NOTE — Patient Instructions (Signed)
Thank you allowing the Chronic Care Management Team to be a part of your care! It was a pleasure speaking with you today!   CCM (Chronic Care Management) Team   Salsabeel Gorelick RN, BSN Nurse Care Coordinator  [phone]  [name] PharmD  Clinical Pharmacist  [phone]  [name] LCSW Clinical Social Worker [phone]  Goals Addressed            This Visit's Progress   . RNCM-My headaches are worse (pt-stated)       Current Barriers:  [name] Knowledge Deficits related to basic understanding of hypertension pathophysiology and self care management . Knowledge Deficits related to understanding of medications prescribed for management of hypertension . Non-adherence to prescribed medication regimen . Film/video editor.   Case Manager Clinical Goal(s):  [name] Over the next 30 days, patient will attend all scheduled medical appointments: PCP 05/21/19 and General Neurology supposed to call her for appt . Over the next 90 days, patient will demonstrate improved adherence to prescribed treatment plan for hypertension as evidenced by taking all medications as prescribed, monitoring and recording blood pressure as directed, adhering to low sodium/DASH diet  Interventions:  [name] UNABLE to independently:manage increased worsening headaches . Provided education to patient re: stroke prevention, s/s of heart attack and stroke, DASH diet, complications of uncontrolled blood pressure . Reviewed medications with patient and discussed importance of compliance . Discussed plans with patient for ongoing care management follow up and provided patient with direct contact information for care management team . Advised patient, providing education and rationale, to monitor blood pressure daily and record, calling PCP for findings outside established parameters.  . Reviewed scheduled/upcoming provider appointments including:  PCP 05/21/2019 and General Neurology Supposed to call  . Patient  states she is having an issue obtaining her medication from the new pill packaging pharmacy related to cost. She stated the cost was 45$ for a 3 month supply. Made patient aware that equals out to 15$ for all of her prescriptions per month. She stated she just can't understand why it would be this much. Nash Dimmer with [facility] about medication questions. Pharmacist let me know through EMR messaging she was going to contact pharmacist and contact pt afterwards. . Patient stated she was out of some of her medications.  . Patient continues to be diligent with obtaining b/ps and CBGs.  . Patient reports blood pressures  and blood sugars as follows:  . 05/11/19 . 05/12/19 . CBG .  AM  . 151/94 . AM . 148/93 . 8/23 . 86 .  pulse . 61 . pulse . 66 . 8/24 . 120    Patient Self Care Activities:  . UNABLE to independently manage worsening headaches . Checks BP and records as discussed  Initial goal documentation         The patient verbalized understanding of instructions provided today and declined a print copy of patient instruction materials.   The patient has been provided with contact information for the care management team and has been advised to call with any health related questions or concerns.

## 2019-05-12 NOTE — Patient Instructions (Signed)
Thank you allowing the Chronic Care Management Team to be a part of your care! It was a pleasure speaking with you today!     CCM (Chronic Care Management) Team    [name] RN, BSN Nurse Care Coordinator  [phone]   [name] PharmD  Clinical Pharmacist  [phone]   [name] LCSW Clinical Social Worker [phone]  Visit Information  Goals Addressed            This Visit's Progress   . PharmD-Medication Adherence       Current Barriers:  . Financial Barriers . Knowledge deficits related to coordination of her own care . Limited social support  Pharmacist Clinical Goal(s):  [name] Over the next 30 days, patient will demonstrate improved medication adherence as evidenced by verbalized understanding of prescribed medication regimen, assistance available, and patient report of adherence  Interventions: . Receive a coordination of care call from [place] - Reports that Ms. Golub is having difficulty with affording to pick up her medications from [name] . Coordination of care calls to Marcello Fennel, with [facility] and Ms. Ford Cliff billing prescriptions through patient's current coverage o Aid in determining prescriptions currently needed by patient o Discuss importance of not including "as needed" medication in pill packaging and request that these be removed . Advise Ms. Donigan to [facility] directly to discuss payment o Ms. Henes reports [employer] pharmacist agreed to work with her on a payment plan and will deliver her medications today/tomorrow . Counsel patient on continued importance of medication adherence o Discuss with patient the importance of budgeting ahead to ensure that she is able to afford her medication  Patient Self Care Activities:  . Currently UNABLE to consistently manage medications. Receives some assistance from sons, [place] and [name], with managing  medications o Using weekly pillbox as adherence aid o Alarm on phone set for 10 pm as reminder for insulin adminisration . Calls pharmacy for medication refills . Patient to attend scheduled medical appointments . Calls provider office for new concerns or questions  . Patient to check blood sugars twice daily and blood pressure daily and keep log of each.   Please see past updates related to this goal by clicking on the "Past Updates" button in the selected goal          The patient verbalized understanding of instructions provided today and declined a print copy of patient instruction materials.   Telephone follow up appointment with care management team member scheduled for: 8/28  [name], PharmD, [place] [employer] [phone]

## 2019-05-12 NOTE — Chronic Care Management (AMB) (Signed)
Chronic Care Management   Follow Up Note   05/12/2019 Name: MARNI FRANZONI MRN: [id number] DOB: [birth year]  Referred by: [name], NP Reason for referral : Chronic Care Management (HTN, DM, Tobacco use ) and Care Coordination (Medication refills )   Kendra [name] is a 73 y.o. year old female who is a primary care patient of [name], NP. The CCM team was consulted for assistance with chronic disease management and care coordination needs.    Review of patient status, including review of consultants reports, relevant laboratory and other test results, and collaboration with appropriate care team members and the patient's provider was performed as part of comprehensive patient evaluation and provision of chronic care management services.    SDOH (Social Determinants of Health) screening performed today: Housing  Financial Strain  Depression   Stress. See Care Plan for related entries.   Outpatient Encounter Medications as of 05/12/2019  Medication Sig Note  . acetaminophen (TYLENOL) 500 MG tablet Take 2 tablets (1,000 mg total) by mouth every 8 (eight) hours as needed for mild pain or moderate pain.   [name] albuterol (VENTOLIN HFA) 108 (90 Base) MCG/ACT inhaler INHALE 1 TO 2 PUFFS INTO THE LUNGS EVERY 6 HOURS AS NEEDED FOR WHEEZING OR SHORTNESS OF BREATH   . aspirin 81 MG EC tablet Take 1 tablet (81 mg total) by mouth daily. Swallow whole.   [name] atorvastatin (LIPITOR) 40 MG tablet Take 1 tablet (40 mg total) by mouth daily with supper.   . baclofen (LIORESAL) 10 MG tablet TAKE 1 TABLET BY MOUTH ONCE DAILY AS NEEDED MUSCLE SPASMS   . bisacodyl (DULCOLAX) 5 MG EC tablet Take 5 mg by mouth daily as needed for moderate constipation. 01/30/2019: Reports using 1 tablet (5 mg)/day on 2 days in past week  . Blood Glucose Monitoring Suppl (ONETOUCH VERIO) w/Device KIT 1 kit by Does not apply route 2 (two) times daily.   [name] buPROPion (WELLBUTRIN XL) 150 MG 24 hr tablet Take 1  tablet (150 mg total) by mouth daily.   . Cholecalciferol (VITAMIN D3) 50 MCG (2000 UT) capsule Take 1 capsule (2,000 Units total) by mouth daily. (Patient not taking: Reported on 04/30/2019)   . Fluticasone-Umeclidin-Vilant (TRELEGY ELLIPTA) 100-62.5-25 MCG/INH AEPB Inhale 1 puff into the lungs daily.   [name] glucose blood (ONETOUCH VERIO) test strip Use as instructed   . Insulin Glargine (LANTUS SOLOSTAR) 100 UNIT/ML Solostar Pen INJECT  24 UNITS SUBCUTANEOUSLY EVERY DAY  AT  10PM (Patient taking differently: Inject 18 Units into the skin at bedtime. )   . Insulin Pen Needle (BD PEN NEEDLE NANO U/F) 32G X 4 MM MISC USE WITH LANTUS AS DIRECTED (Patient not taking: Reported on 04/30/2019)   . Lancet Devices (ONE TOUCH DELICA LANCING DEV) MISC 1 Device by Does not apply route 2 (two) times daily.   [name] lisinopril (PRINIVIL,ZESTRIL) 2.5 MG tablet Take 1 tablet (2.5 mg total) by mouth daily.   . meloxicam (MOBIC) 15 MG tablet Take 1 tablet (15 mg total) by mouth daily as needed (for moderate arthritis pain).   . metFORMIN (GLUCOPHAGE-XR) 500 MG 24 hr tablet Take 1 tablet (500 mg total) by mouth 2 (two) times daily with a meal.   . nicotine (NICODERM CQ - DOSED IN MG/24 HOURS) 14 mg/24hr patch Place onto the skin.   [name] Lancets 35H MISC 1 Device by Does not apply route 2 (two) times daily.   . polyethylene glycol powder (GLYCOLAX/MIRALAX) 17  GM/SCOOP powder Take 17-34 g by mouth daily as needed for mild constipation or moderate constipation.    No facility-administered encounter medications on file as of 05/12/2019.      Goals Addressed            This Visit's Progress   . RNCM-My headaches are worse (pt-stated)       Current Barriers:  [name] Knowledge Deficits related to basic understanding of hypertension pathophysiology and self care management . Knowledge Deficits related to understanding of medications prescribed for management of hypertension . Non-adherence to prescribed medication  regimen . Film/video editor.   Case Manager Clinical Goal(s):  [name] Over the next 30 days, patient will attend all scheduled medical appointments: PCP 05/21/19 and General Neurology supposed to call her for appt . Over the next 90 days, patient will demonstrate improved adherence to prescribed treatment plan for hypertension as evidenced by taking all medications as prescribed, monitoring and recording blood pressure as directed, adhering to low sodium/DASH diet  Interventions:  [name] UNABLE to independently:manage increased worsening headaches . Provided education to patient re: stroke prevention, s/s of heart attack and stroke, DASH diet, complications of uncontrolled blood pressure . Reviewed medications with patient and discussed importance of compliance . Discussed plans with patient for ongoing care management follow up and provided patient with direct contact information for care management team . Advised patient, providing education and rationale, to monitor blood pressure daily and record, calling PCP for findings outside established parameters.  . Reviewed scheduled/upcoming provider appointments including:  PCP 05/21/2019 and General Neurology Supposed to call  . Patient states she is having an issue obtaining her medication from the new pill packaging pharmacy related to cost. She stated the cost was 45$ for a 3 month supply. Made patient aware that equals out to 15$ for all of her prescriptions per month. She stated she just can't understand why it would be this much. Nash Dimmer with [facility] about medication questions. Pharmacist let me know through EMR messaging she was going to contact pharmacist and contact pt afterwards. . Patient stated she was out of some of her medications.  . Patient continues to be diligent with obtaining b/ps and CBGs.  . Patient reports blood pressures  and blood sugars as follows:  . 05/11/19 . 05/12/19 . CBG .  AM  . 151/94 . AM . 148/93 . 8/23 . 86 .   pulse . 61 . pulse . 66 . 8/24 . 120    Patient Self Care Activities:  . UNABLE to independently manage worsening headaches . Checks BP and records as discussed  Initial goal documentation          The patient has been provided with contact information for the care management team and has been advised to call with any health related questions or concerns.    Merlene Morse Hadrian Yarbrough RN, BSN Nurse Case Pharmacist, [facility] Care Management  [phone]) Business Mobile

## 2019-05-12 NOTE — Chronic Care Management (AMB) (Signed)
Chronic Care Management   Follow Up Note   05/12/2019 Name: PARALEE PENDERGRASS MRN: [id number] DOB: 10-05-1945  Referred by: [name], NP Reason for referral : Chronic Care Management (Patient Phone Call) and Care Coordination [facility])   YAREMI STAHLMAN is a 73 y.o. year old female who is a primary care patient of [name], NP. The CCM team was consulted for assistance with chronic disease management and care coordination needs.   Ms. Tregoning has a medical history which includes but is not limited to recent CVA (11/2018), Type 2 Diabetes, hypertension, hyperlipidemia and COPD.  Receive a coordination of care call from [place], who reports Ms. Fulbright is having difficulty with affording to pick up her medications from [place]  I reached out to [name] by phone today.   Review of patient status, including review of consultants reports, relevant laboratory and other test results, and collaboration with appropriate care team members and the patient's provider was performed as part of comprehensive patient evaluation and provision of chronic care management services.    Outpatient Encounter Medications as of 05/12/2019  Medication Sig Note  . acetaminophen (TYLENOL) 500 MG tablet Take 2 tablets (1,000 mg total) by mouth every 8 (eight) hours as needed for mild pain or moderate pain.   [name] albuterol (VENTOLIN HFA) 108 (90 Base) MCG/ACT inhaler INHALE 1 TO 2 PUFFS INTO THE LUNGS EVERY 6 HOURS AS NEEDED FOR WHEEZING OR SHORTNESS OF BREATH   . aspirin 81 MG EC tablet Take 1 tablet (81 mg total) by mouth daily. Swallow whole.   [name] atorvastatin (LIPITOR) 40 MG tablet Take 1 tablet (40 mg total) by mouth daily with supper.   . baclofen (LIORESAL) 10 MG tablet TAKE 1 TABLET BY MOUTH ONCE DAILY AS NEEDED MUSCLE SPASMS   . bisacodyl (DULCOLAX) 5 MG EC tablet Take 5 mg by mouth daily as needed for moderate constipation. 01/30/2019: Reports using 1  tablet (5 mg)/day on 2 days in past week  . Blood Glucose Monitoring Suppl (ONETOUCH VERIO) w/Device KIT 1 kit by Does not apply route 2 (two) times daily.   [name] buPROPion (WELLBUTRIN XL) 150 MG 24 hr tablet Take 1 tablet (150 mg total) by mouth daily.   . Cholecalciferol (VITAMIN D3) 50 MCG (2000 UT) capsule Take 1 capsule (2,000 Units total) by mouth daily. (Patient not taking: Reported on 04/30/2019)   . Fluticasone-Umeclidin-Vilant (TRELEGY ELLIPTA) 100-62.5-25 MCG/INH AEPB Inhale 1 puff into the lungs daily.   [name] glucose blood (ONETOUCH VERIO) test strip Use as instructed   . Insulin Glargine (LANTUS SOLOSTAR) 100 UNIT/ML Solostar Pen INJECT  24 UNITS SUBCUTANEOUSLY EVERY DAY  AT  10PM (Patient taking differently: Inject 18 Units into the skin at bedtime. )   . Insulin Pen Needle (BD PEN NEEDLE NANO U/F) 32G X 4 MM MISC USE WITH LANTUS AS DIRECTED (Patient not taking: Reported on 04/30/2019)   . Lancet Devices (ONE TOUCH DELICA LANCING DEV) MISC 1 Device by Does not apply route 2 (two) times daily.   [name] lisinopril (PRINIVIL,ZESTRIL) 2.5 MG tablet Take 1 tablet (2.5 mg total) by mouth daily.   . meloxicam (MOBIC) 15 MG tablet Take 1 tablet (15 mg total) by mouth daily as needed (for moderate arthritis pain).   . metFORMIN (GLUCOPHAGE-XR) 500 MG 24 hr tablet Take 1 tablet (500 mg total) by mouth 2 (two) times daily with a meal.   . nicotine (NICODERM CQ -  DOSED IN MG/24 HOURS) 14 mg/24hr patch Place onto the skin.   [name] Lancets 75Y MISC 1 Device by Does not apply route 2 (two) times daily.   . polyethylene glycol powder (GLYCOLAX/MIRALAX) 17 GM/SCOOP powder Take 17-34 g by mouth daily as needed for mild constipation or moderate constipation.    No facility-administered encounter medications on file as of 05/12/2019.     Goals Addressed            This Visit's Progress   . PharmD-Medication Adherence       Current Barriers:  . Financial Barriers . Knowledge deficits related to  coordination of her own care . Limited social support  Pharmacist Clinical Goal(s):  [name] Over the next 30 days, patient will demonstrate improved medication adherence as evidenced by verbalized understanding of prescribed medication regimen, assistance available, and patient report of adherence  Interventions: . Receive a coordination of care call from [place] - Reports that Ms. Cerino is having difficulty with affording to pick up her medications from [facility] . Coordination of care calls to Marcello Fennel, with [facility] and Ms. McFarland billing prescriptions through patient's current coverage o Aid in determining prescriptions currently needed by patient o Discuss importance of not including "as needed" medication in pill packaging and request that these be removed . Advise Ms. Ottaway to [facility] directly to discuss payment o Ms. Kramm reports [employer] pharmacist agreed to work with her on a payment plan and will deliver her medications today/tomorrow . Counsel patient on continued importance of medication adherence o Discuss with patient the importance of budgeting ahead to ensure that she is able to afford her medication  Patient Self Care Activities:  . Currently UNABLE to consistently manage medications. Receives some assistance from sons, [place] and [name], with managing medications o Using weekly pillbox as adherence aid o Alarm on phone set for 10 pm as reminder for insulin adminisration . Calls pharmacy for medication refills . Patient to attend scheduled medical appointments . Calls provider office for new concerns or questions  . Patient to check blood sugars twice daily and blood pressure daily and keep log of each.   Please see past updates related to this goal by clicking on the "Past Updates" button in the selected goal          Plan  Telephone follow up appointment with care management team  member scheduled for: 8/28 at 1:30 pm  [name], PharmD, [place] [phone]

## 2019-05-13 ENCOUNTER — Telehealth: Payer: Self-pay

## 2019-05-16 ENCOUNTER — Ambulatory Visit: Payer: Self-pay | Admitting: Pharmacist

## 2019-05-16 DIAGNOSIS — E118 Type 2 diabetes mellitus with unspecified complications: Secondary | ICD-10-CM

## 2019-05-16 DIAGNOSIS — I10 Essential (primary) hypertension: Secondary | ICD-10-CM | POA: Diagnosis not present

## 2019-05-16 DIAGNOSIS — Z794 Long term (current) use of insulin: Secondary | ICD-10-CM

## 2019-05-16 NOTE — Chronic Care Management (AMB) (Signed)
Chronic Care Management   Follow Up Note   05/16/2019 Name: Kendra [name] MRN: [id number] DOB: May 05, 1946  Referred by: Kendra College, NP Reason for referral : Chronic Care Management (Patient Phone Call)   Kendra [name] is a 73 y.o. year old female who is a primary care patient of Kendra College, NP. The CCM team was consulted for assistance with chronic disease management and care coordination needs.  Kendra [name] has a medical history which includes but is not limited to recent CVA (11/2018), Type 2 Diabetes, hypertension, hyperlipidemia and COPD.  I reached out to Kendra [name] by phone today.   Review of patient status, including review of consultants reports, relevant laboratory and other test results, and collaboration with appropriate care team members and the patient's provider was performed as part of comprehensive patient evaluation and provision of chronic care management services.    SDOH (Social Determinants of Health) screening performed today: Tobacco Use. See Care Plan for related entries.   Outpatient Encounter Medications as of 05/16/2019  Medication Sig Note  . aspirin 81 MG EC tablet Take 1 tablet (81 mg total) by mouth daily. Swallow whole.   [name] atorvastatin (LIPITOR) 40 MG tablet Take 1 tablet (40 mg total) by mouth daily with supper.   [name] buPROPion (WELLBUTRIN XL) 150 MG 24 hr tablet Take 1 tablet (150 mg total) by mouth daily.   . Cholecalciferol (VITAMIN D3) 50 MCG (2000 UT) capsule Take 1 capsule (2,000 Units total) by mouth daily.   . Insulin Glargine (LANTUS SOLOSTAR) 100 UNIT/ML Solostar Pen INJECT  24 UNITS SUBCUTANEOUSLY EVERY DAY  AT  10PM (Patient taking differently: Inject 18 Units into the skin at bedtime. )   . lisinopril (PRINIVIL,ZESTRIL) 2.5 MG tablet Take 1 tablet (2.5 mg total) by mouth daily.   . metFORMIN (GLUCOPHAGE-XR) 500 MG 24 hr tablet Take 1 tablet (500 mg total) by mouth 2 (two) times daily with a meal.   .  acetaminophen (TYLENOL) 500 MG tablet Take 2 tablets (1,000 mg total) by mouth every 8 (eight) hours as needed for mild pain or moderate pain.   [name] albuterol (VENTOLIN HFA) 108 (90 Base) MCG/ACT inhaler INHALE 1 TO 2 PUFFS INTO THE LUNGS EVERY 6 HOURS AS NEEDED FOR WHEEZING OR SHORTNESS OF BREATH   . baclofen (LIORESAL) 10 MG tablet TAKE 1 TABLET BY MOUTH ONCE DAILY AS NEEDED MUSCLE SPASMS   . bisacodyl (DULCOLAX) 5 MG EC tablet Take 5 mg by mouth daily as needed for moderate constipation. 01/30/2019: Reports using 1 tablet (5 mg)/day on 2 days in past week  . Blood Glucose Monitoring Suppl (ONETOUCH VERIO) w/Device KIT 1 kit by Does not apply route 2 (two) times daily.   . Fluticasone-Umeclidin-Vilant (TRELEGY ELLIPTA) 100-62.5-25 MCG/INH AEPB Inhale 1 puff into the lungs daily.   [name] glucose blood (ONETOUCH VERIO) test strip Use as instructed   . Insulin Pen Needle (BD PEN NEEDLE NANO U/F) 32G X 4 MM MISC USE WITH LANTUS AS DIRECTED (Patient not taking: Reported on 04/30/2019)   . Lancet Devices (ONE TOUCH DELICA LANCING DEV) MISC 1 Device by Does not apply route 2 (two) times daily.   . meloxicam (MOBIC) 15 MG tablet Take 1 tablet (15 mg total) by mouth daily as needed (for moderate arthritis pain).   . nicotine (NICODERM CQ - DOSED IN MG/24 HOURS) 14 mg/24hr patch Place onto the skin.   Kendra [name] MISC 1 Device by Does not apply  route 2 (two) times daily.   . polyethylene glycol powder (GLYCOLAX/MIRALAX) 17 GM/SCOOP powder Take 17-34 g by mouth daily as needed for mild constipation or moderate constipation.    No facility-administered encounter medications on file as of 05/16/2019.     Goals Addressed            This Visit's Progress   . PharmD-Medication Adherence       Current Barriers:  . Financial Barriers . Knowledge deficits related to coordination of her own care . Limited social support  Pharmacist Clinical Goal(s):  [name] Over the next 30 days, patient will  demonstrate improved medication adherence as evidenced by verbalized understanding of prescribed medication regimen, assistance available, and patient report of adherence  Interventions: . Follow up with Kendra [name] regarding pill packaging from Kendra [name] o Kendra [name] reports Kendra [name] is working with her on a payment plan and delivered her medications over the weekend o Counsel patient on continued importance of medication adherence - Kendra [name] reports that pill packaging is going well.  - Have patient review pill packaging while on phone and patient denies any missed doses since starting on 8/22 o Discuss with patient the importance of budgeting ahead to ensure that she is able to afford her medication . Review with patient recent blood pressure results (see below) o Review with patient proper blood pressure monitoring technique  . Review with patient recent blood sugar results (see below) o Note patient's blood sugar was relatively elevated on 8/21. Suspect patient may have missed metformin that day as she was transitioning to pill packaging o Patient reports two symptomatic low blood sugars on 8/25 and 8/26, both readings of 67 mg/dL - Review with patient signs of low blood sugar and how to manage lows . Counsel patient on importance of eating regular consistent meals . Collaborated with PCP regarding formulary change with patient's insurance switch from [employer] to [facility]). Kendra [name] now preferring Lantus brand of insulin glargine, rather than Basaglar o Review this change back to Lantus from [place] with Kendra [name]. Patient verbalizes understanding that both are insulin glargine. Reports that she has been taking insulin glargine 18 units QHS. Denies missed doses . Follow up with patient regarding smoking cessation - denies interest in smoking cessation at this time, but confirms still has Kendra [name] phone number . Collaborate with PCP regarding  patient's recent blood sugars and blood sugar management o PCP agrees to reducing patient's Lantus dose from 18 units QHS to 16 units QHS.  o Call back to Ms. Esson. Patient verbalizes understanding via teach back that she will take insulin glargine (Lantus/Basaglar) 16 units QHS as directed by PCP  Patient Self Care Activities:  . Currently UNABLE to consistently manage medications. Receives some assistance from sons, Kendra [name] and Kendra [name], with managing medications o Using weekly pillbox as adherence aid o Alarm on phone set for 10 pm as reminder for insulin adminisration . Calls pharmacy for medication refills . Patient to attend scheduled medical appointments o Upcoming PCP visit scheduled for 9/2 - Remind patient to bring new insurance card with her to visit . Calls provider office for new concerns or questions  . Patient to check blood sugars twice daily and blood pressure daily and keep log of each.  Date AM  Blood Pressure PM  Blood Pressure Notes  21 - August 167/96, HR 76 -   22 - August 154/92, HR 70 136/85, HR 76 *Started using medicine from  pill pack  23 - August 151/94, HR 66 155/98, HR 84   24 - August - 153/92, HR 79   25 - August - 144/91, HR 65   26 - August 155/93, HR 73 -   27 - August - 119/78, HR 69   28 - August 144/90, HR 61      Date Fasting Blood Glucose After Breakfast Before Lunch After Lunch Notes  21 - August   171 200   22 - August 94 110 105 123 *Started using medicine from pill pack  23 - August   86 100   24 - August   120 110   25 - August 67 120 82 120   26 - August   67 109   27 - August   92 128   28 - August 89        Please see past updates related to this goal by clicking on the "Past Updates" button in the selected goal          Plan  Telephone follow up appointment with care management team member scheduled for: 9/15 at 2 pm  [name], PharmD, [place] [phone]

## 2019-05-16 NOTE — Patient Instructions (Signed)
Thank you allowing the Chronic Care Management Team to be a part of your care! It was a pleasure speaking with you today!     CCM (Chronic Care Management) Team    [name] RN, BSN Nurse Care Coordinator  [phone]   [name] PharmD  Clinical Pharmacist  [phone]   [name] LCSW Clinical Social Worker [phone]  Visit Information  Goals Addressed            This Visit's Progress   . PharmD-Medication Adherence       Current Barriers:  . Financial Barriers . Knowledge deficits related to coordination of her own care . Limited social support  Pharmacist Clinical Goal(s):  [name] Over the next 30 days, patient will demonstrate improved medication adherence as evidenced by verbalized understanding of prescribed medication regimen, assistance available, and patient report of adherence  Interventions: . Follow up with Ms. Cicio regarding pill packaging from [employer] o Ms. Goette reports [employer] is working with her on a payment plan and delivered her medications over the weekend o Counsel patient on continued importance of medication adherence - Ms. Robeck reports that pill packaging is going well.  - Have patient review pill packaging while on phone and patient denies any missed doses since starting on 8/22 o Discuss with patient the importance of budgeting ahead to ensure that she is able to afford her medication . Review with patient recent blood pressure results (see below) o Review with patient proper blood pressure monitoring technique  . Review with patient recent blood sugar results (see below) o Note patient's blood sugar was relatively elevated on 8/21. Suspect patient may have missed metformin that day as she was transitioning to pill packaging o Patient reports two symptomatic low blood sugars on 8/25 and 8/26, both readings of 67 mg/dL - Review with patient signs of low blood sugar and how to manage lows . Counsel patient  on importance of eating regular consistent meals . Collaborated with PCP regarding formulary change with patient's insurance switch from [employer] to [facility]). [employer] now preferring Lantus brand of insulin glargine, rather than Basaglar o Review this change back to Lantus from [place] with Ms. Tiu. Patient verbalizes understanding that both are insulin glargine. Reports that she has been taking insulin glargine 18 units QHS. Denies missed doses . Follow up with patient regarding smoking cessation - denies interest in smoking cessation at this time, but confirms still has [place] [employer] phone number . Collaborate with PCP regarding patient's recent blood sugars and blood sugar management o PCP agrees to reducing patient's Lantus dose from 18 units QHS to 16 units QHS.  o Call back to Ms. Hobin. Patient verbalizes understanding via teach back that she will take insulin glargine (Lantus/Basaglar) 16 units QHS as directed by PCP  Patient Self Care Activities:  . Currently UNABLE to consistently manage medications. Receives some assistance from sons, [place] and [name], with managing medications o Using weekly pillbox as adherence aid o Alarm on phone set for 10 pm as reminder for insulin adminisration . Calls pharmacy for medication refills . Patient to attend scheduled medical appointments o Upcoming PCP visit scheduled for 9/2 - Remind patient to bring new insurance card with her to visit . Calls provider office for new concerns or questions  . Patient to check blood sugars twice daily and blood pressure daily and keep log of each.  Date AM  Blood Pressure PM  Blood Pressure Notes  21 - August 167/96, HR 76 -   22 - August 154/92, HR 70 136/85, HR 76 *Started using medicine from pill pack  23 - August 151/94, HR 66 155/98, HR 84   24 - August - 153/92, HR 79   25 - August - 144/91, HR 65   26 - August 155/93, HR 73 -   27 - August - 119/78, HR 69   28 - August  144/90, HR 61      Date Fasting Blood Glucose After Breakfast Before Lunch After Lunch Notes  21 - August   171 200   22 - August 94 110 105 123 *Started using medicine from pill pack  23 - August   86 100   24 - August   120 110   25 - August 67 120 82 120   26 - August   67 109   27 - August   92 128   28 - August 89        Please see past updates related to this goal by clicking on the "Past Updates" button in the selected goal          The patient verbalized understanding of instructions provided today and declined a print copy of patient instruction materials.   Telephone follow up appointment with care management team member scheduled for: 9/15 at 2 pm  [name], PharmD, [place] [phone]

## 2019-05-19 ENCOUNTER — Other Ambulatory Visit: Payer: Self-pay | Admitting: Nurse Practitioner

## 2019-05-19 DIAGNOSIS — J449 Chronic obstructive pulmonary disease, unspecified: Secondary | ICD-10-CM

## 2019-05-21 ENCOUNTER — Ambulatory Visit (INDEPENDENT_AMBULATORY_CARE_PROVIDER_SITE_OTHER): Payer: Medicare Other | Admitting: Nurse Practitioner

## 2019-05-21 ENCOUNTER — Encounter: Payer: Self-pay | Admitting: Nurse Practitioner

## 2019-05-21 ENCOUNTER — Other Ambulatory Visit: Payer: Self-pay

## 2019-05-21 VITALS — BP 114/72 | HR 87 | Ht 64.0 in | Wt 133.4 lb

## 2019-05-21 DIAGNOSIS — F3341 Major depressive disorder, recurrent, in partial remission: Secondary | ICD-10-CM

## 2019-05-21 DIAGNOSIS — I10 Essential (primary) hypertension: Secondary | ICD-10-CM

## 2019-05-21 DIAGNOSIS — Z794 Long term (current) use of insulin: Secondary | ICD-10-CM | POA: Diagnosis not present

## 2019-05-21 DIAGNOSIS — R809 Proteinuria, unspecified: Secondary | ICD-10-CM

## 2019-05-21 DIAGNOSIS — E1129 Type 2 diabetes mellitus with other diabetic kidney complication: Secondary | ICD-10-CM

## 2019-05-21 DIAGNOSIS — Z23 Encounter for immunization: Secondary | ICD-10-CM | POA: Diagnosis not present

## 2019-05-21 DIAGNOSIS — IMO0002 Reserved for concepts with insufficient information to code with codable children: Secondary | ICD-10-CM

## 2019-05-21 DIAGNOSIS — E118 Type 2 diabetes mellitus with unspecified complications: Secondary | ICD-10-CM | POA: Diagnosis not present

## 2019-05-21 DIAGNOSIS — E1165 Type 2 diabetes mellitus with hyperglycemia: Secondary | ICD-10-CM | POA: Diagnosis not present

## 2019-05-21 LAB — POCT GLYCOSYLATED HEMOGLOBIN (HGB A1C): Hemoglobin A1C: 6 % — AB (ref 4.0–5.6)

## 2019-05-21 MED ORDER — LISINOPRIL 5 MG PO TABS: 5.0000 mg | ORAL_TABLET | Freq: Every day | ORAL | 5 refills | Status: DC

## 2019-05-21 MED ORDER — LANTUS SOLOSTAR 100 UNIT/ML ~~LOC~~ SOPN: PEN_INJECTOR | SUBCUTANEOUS | 3 refills | Status: DC

## 2019-05-21 MED ORDER — BUPROPION HCL ER (XL) 300 MG PO TB24: 300.0000 mg | ORAL_TABLET | Freq: Every day | ORAL | 5 refills | Status: DC

## 2019-05-21 NOTE — Patient Instructions (Addendum)
Kendra [name],   Thank you for coming in to clinic today.  1. DECREASE insulin glargine to 12 units daily.  2. Do not take muscle spasm medication with headache.  3. Increase lisinopril to 5 mg (next pill delivery).  4. Increase Wellbutrin to 300 mg daily (next pill delivery).  5. If headaches do not improve with fewer low blood sugars and fewer high blood pressures, call clinic or tell [name] or [name].  Blood pressure goal is < 130/80.  You should see decreases in blood pressure after next pill pack delivery.  Please schedule a follow-up appointment with [name], AGNP. Return in about 3 months (around 08/20/2019) for diabetes, depression.  Follow-up 6 weeks if needed for headaches not improving after medication changes.  If you have any other questions or concerns, please feel free to call the clinic or send a message through [place]. You may also schedule an earlier appointment if necessary.  You will receive a survey after today's visit either digitally by e-mail or paper by [employer]. Your experiences and feedback matter to [place].  Please respond so we know how we are doing as we provide care for you.   [name], DNP, AGNP-BC Adult Gerontology Nurse Practitioner [place]

## 2019-05-21 NOTE — Progress Notes (Signed)
Subjective:    Patient ID: Kendra [name], female    DOB: 03-23-1946, 73 y.o.   MRN: [id number]  Kendra [name] is a 73 y.o. female presenting on 05/21/2019 for Diabetes (mood, persistent headaches )  HPI Diabetes Pt presents today for follow up of Type 2 diabetes mellitus. She is checking CBG at home with a range of 64-140, usually 80-115.  Patient has reduced lantus to 16 units as directed by [name] interval to last visit in office - Current diabetic medications include: metformin and Lantus 16 units daily - She is symptomatic with low sugars occasionally feeling clammy, shaky.  - She denies polydipsia, polyphagia, polyuria, headaches, shakiness, pain, numbness or tingling in extremities and changes in vision.   - Clinical course has been improving. - She  reports no regular exercise routine. - Her diet is moderate in salt, moderate in fat, and low in carbohydrates. - Weight trend: decreasing steadily  PREVENTION: Eye exam current (within one year): no Foot exam current (within one year): yes Lipid/ASCVD risk reduction - on statin: yes Kidney protection - on ace or arb: yes Recent Labs    09/30/18 1113 05/21/19 1104  HGBA1C 7.8* 6.0*   Hypertension - She is checking BP at home or outside of clinic.  Readings 130-160/80s - Current medications: lisinopril 2.5 mg once daily, tolerating well without side effects - She is not currently symptomatic. - Pt denies headache, lightheadedness, dizziness, changes in vision, chest tightness/pressure, palpitations, leg swelling, sudden loss of speech or loss of consciousness. - She reports no regular exercise routine - Her diet is moderate in salt, moderate in fat, and moderate in carbohydrates.   Headaches Feels like things are crawling under her scalp.  Causes problems with sleeping.  Patient was having headache prior to hospitalization.  Continues having them now.  Tingling is noted.  No head pressure. Burning pain is most  noticed.  - Patient is taking Tylenol and muscle spasm pill.  Occasionally takes arthritris pill for headache. - None of the medications help significantly.   - No vision changes,  - Balance problems worsen with headache - Only occasionally dizzy, but after "muscle spasm pill"  Depression Patient feels she can concentrate better. Doesn't stay mad any more.  Stays disappointmed.  Is in apt 24/7.   Depression screen [name] 2/9 04/30/2019 03/05/2019 05/07/2018 01/04/2018 06/08/2017  Decreased Interest 3 2 2  0 0  Down, Depressed, Hopeless 2 2 3 2 3   PHQ - 2 Score 5 4 5 2 3   Altered sleeping 2 1 3 2 3   Tired, decreased energy 1 2 3 3 3   Change in appetite 0 1 0 0 1  Feeling bad or failure about yourself  0 2 2 0 2  Trouble concentrating 0 3 3 0 2  Moving slowly or fidgety/restless 1 2 3  0 1  Suicidal thoughts 0 1 0 0 0  PHQ-9 Score 9 16 19 7 15   Difficult doing work/chores Not difficult at all Not difficult at all Not difficult at all Not difficult at all Somewhat difficult     Social History   Tobacco Use  . Smoking status: Current Some Day Smoker    Packs/day: 0.25    Types: Cigarettes  . Smokeless tobacco: Never Used  Substance Use Topics  . Alcohol use: No  . Drug use: No    Review of Systems Per HPI unless specifically indicated above     Objective:    BP 114/72 (BP Location:  Right Arm, Patient Position: Sitting, Cuff Size: Normal)   Pulse 87   Ht 5\' 4"  (1.626 m)   Wt 133 lb 6.4 oz (60.5 kg)   SpO2 100%   BMI 22.90 kg/m   Wt Readings from Last 3 Encounters:  05/21/19 133 lb 6.4 oz (60.5 kg)  09/30/18 153 lb 9.6 oz (69.7 kg)  07/31/18 163 lb (73.9 kg)    Physical Exam Vitals signs reviewed.  Constitutional:      General: She is awake. She is not in acute distress.    Appearance: She is well-developed.  HENT:     Head: Normocephalic and atraumatic.  Neck:     Musculoskeletal: Normal range of motion and neck supple.     Vascular: No carotid bruit.   Cardiovascular:     Rate and Rhythm: Normal rate and regular rhythm.     Pulses:          Radial pulses are 2+ on the right side and 2+ on the left side.       Posterior tibial pulses are 1+ on the right side and 1+ on the left side.     Heart sounds: Normal heart sounds, S1 normal and S2 normal.  Pulmonary:     Effort: Pulmonary effort is normal. No respiratory distress.     Breath sounds: Normal breath sounds and air entry.  Skin:    General: Skin is warm and dry.  Neurological:     Mental Status: She is alert and oriented to person, place, and time.  Psychiatric:        Attention and Perception: Attention normal.        Mood and Affect: Mood and affect normal.        Behavior: Behavior normal. Behavior is cooperative.      Results for orders placed or performed in visit on 05/21/19  POCT glycosylated hemoglobin (Hb A1C)  Result Value Ref Range   Hemoglobin A1C 6.0 (A) 4.0 - 5.6 %   HbA1c POC (<> result, manual entry)     HbA1c, POC (prediabetic range)     HbA1c, POC (controlled diabetic range)        Assessment & Plan:   Problem List Items Addressed This Visit      Cardiovascular and Mediastinum   Essential hypertension   Relevant Medications   lisinopril (ZESTRIL) 5 MG tablet     Endocrine   Diabetes mellitus type 2, controlled, with complications (HCC)   Relevant Medications   lisinopril (ZESTRIL) 5 MG tablet   Insulin Glargine (LANTUS SOLOSTAR) 100 UNIT/ML Solostar Pen   Other Relevant Orders   POCT glycosylated hemoglobin (Hb A1C) (Completed)     Other   Clinical depression   Relevant Medications   buPROPion (WELLBUTRIN XL) 300 MG 24 hr tablet    Other Visit Diagnoses    Uncontrolled type 2 diabetes mellitus with insulin therapy ([place])    -  Primary   Relevant Medications   lisinopril (ZESTRIL) 5 MG tablet   Insulin Glargine (LANTUS SOLOSTAR) 100 UNIT/ML Solostar Pen   Need for immunization against influenza       Relevant Orders   Flu Vaccine QUAD  High Dose(Fluad) (Completed)   Microalbuminuria due to type 2 diabetes mellitus ([place])       See AP for DMT2   Relevant Medications   lisinopril (ZESTRIL) 5 MG tablet   Insulin Glargine (LANTUS SOLOSTAR) 100 UNIT/ML Solostar Pen      Meds ordered this encounter  Medications  .  buPROPion (WELLBUTRIN XL) 300 MG 24 hr tablet    Sig: Take 1 tablet (300 mg total) by mouth daily.    Dispense:  30 tablet    Refill:  5    Increase dose with next pill pack delivery is okay.    Order Specific Question:   Supervising Provider    Answer:   Olin Hauser [[id number]]  . lisinopril (ZESTRIL) 5 MG tablet    Sig: Take 1 tablet (5 mg total) by mouth daily.    Dispense:  30 tablet    Refill:  5    May start increased dose with next pill packs.    Order Specific Question:   Supervising Provider    Answer:   Olin Hauser [[id number]]  . Insulin Glargine (LANTUS SOLOSTAR) 100 UNIT/ML Solostar Pen    Sig: INJECT  12 UNITS SUBCUTANEOUSLY EVERY DAY  AT  10PM    Dispense:  8 pen    Refill:  3    Always use easy open prescription bottles.    Order Specific Question:   Supervising Provider    Answer:   Olin Hauser [[id number]]   T2DM: Improving T2DM control with A1c now at 6.0%.  Can start medication descalation.  Likely change is weight loss.  Concern for cause is present.  Possibly patient has had changes in eating habits.  Weight loss is over time (30 lbs in 10 months, 20 lbs in 8 months), so unlikely related to malignancy.  Patient's nutritional status seems appropriate as well.  Plan: 1. Medications: - Continue metformin. - REDUCE insulin glargine to 12 units daily after today's visit.  Tolerated previous dose change well without side effects, only had CBG up to 140 for 2 days.  Continued to have low after dose change.   - For [name] Dhalla's follow-up with patient: IF patient has 7 days in a row less than 120 in the morning, decrease by another 2 units every week or longer  interval if visits are not weekly.   Patient did not feel comfortable with her understanding of these instructions. 2.  Encouraged patient to consume enough food, but continue eating healthy foods no more weight loss is desired medically as BMI is normal.  Possible protein-calorie malnutrition may be present if continues losing weight. 3.  Continue lisinopril, statin 4. Encouraged ophthalmology exam.  Will request RN CM to encourage patient to complete this with her ophthalmologist as well. 5. Labs not needed today, due next visit 6. Follow up 3 months  Hypertension Worsening today on exam.  Medications tolerated without side effects.  Continue with changes. Increase lisinopril to 5 mg daily - may still need more for BP reduction..  Refills provided.  Labs at next visit. Followup 3 months.    Depression Currently well controlled without SI/HI or anhedonia.  Refills needed and provided.  Follow-up 3 months.   Follow up plan: Return in about 3 months (around 08/20/2019) for diabetes, depression.  [name], DNP, AGPCNP-BC Adult Gerontology Primary Care Nurse Practitioner [facility] 05/21/2019, 11:03 AM

## 2019-05-24 ENCOUNTER — Encounter: Payer: Self-pay | Admitting: Nurse Practitioner

## 2019-06-03 ENCOUNTER — Ambulatory Visit (INDEPENDENT_AMBULATORY_CARE_PROVIDER_SITE_OTHER): Payer: Medicare Other | Admitting: Pharmacist

## 2019-06-03 DIAGNOSIS — E118 Type 2 diabetes mellitus with unspecified complications: Secondary | ICD-10-CM | POA: Diagnosis not present

## 2019-06-03 DIAGNOSIS — Z794 Long term (current) use of insulin: Secondary | ICD-10-CM

## 2019-06-03 DIAGNOSIS — I10 Essential (primary) hypertension: Secondary | ICD-10-CM | POA: Diagnosis not present

## 2019-06-03 NOTE — Patient Instructions (Signed)
Thank you allowing the Chronic Care Management Team to be a part of your care! It was a pleasure speaking with you today!     CCM (Chronic Care Management) Team    Kendra Minor RN, BSN Nurse Care Coordinator  [phone]   Kendra [name] PharmD  Clinical Pharmacist  [phone]   Kendra Fried LCSW Clinical Social Worker [phone]  Visit Information  Goals Addressed            This Visit's Progress   . PharmD-Medication Adherence       Current Barriers:  . Financial Barriers . Knowledge deficits related to coordination of her own care . Limited social support  Pharmacist Clinical Goal(s):  [name] Over the next 30 days, patient will demonstrate improved medication adherence as evidenced by verbalized understanding of prescribed medication regimen, assistance available, and patient report of adherence  Interventions: . Perform chart review o Office visit with PCP on 9/2 . Follow up with Kendra [name] regarding pill packaging from [facility] and medication adherence o Kendra [name] reports she is due for her next set of pill packs from [facility] next week. - Review with patient medication dose changes to expect for bupropion and lisinopril as adjusted by PCP o Denies any missed doses from pill packaging . Review with patient recent blood sugar results (see below) o Reports that she has been taking insulin glargine 13 units QHS, rather than 12 units QHS - Have patient write down direction to take as directed by PCP o Denies recent low blood sugars o Counsel patient on importance of eating regular well balanced meals o Counsel on importance on blood sugar monitoring technique . Counsel on importance of continued blood pressure monitoring and control. o Home AM BP today: 123/83, HR 70 . Encourage patient to schedule eye exam o Kendra [name] notes that she is in need of new eye glasses o Review in-network providers per patient's health plan's website o Patient  plans to follow up with [facility] for appointment. Phone number provided . Coordination of care - provide patient with phone contact info for [facility] as requested o Reports headaches have improved, but continues to have crawling sensation in scalp   Patient Self Care Activities:  . Currently UNABLE to consistently manage medications. Receives some assistance from sons, Kendra [name] and Kendra [name], with managing medications o Using weekly pillbox as adherence aid o Alarm on phone set for 10 pm as reminder for insulin adminisration . Calls pharmacy for medication refills . Patient to attend scheduled medical appointments o Upcoming PCP visit scheduled for 9/2 - Remind patient to bring new insurance card with her to visit . Calls provider office for new concerns or questions  . Patient to check blood sugars twice daily and keep log Date Fasting Blood Glucose After Breakfast Notes  9 - September 116 136   10 - September 121 114   11 - September - -   12 - September 89 90   13 - September 80 99   14 - September 121 210* *Not sure why higher.  15 - September 150 140   Average 113 132    . Patient to check blood pressure daily and keep log o Blood pressure goal: < 130/80     Please see past updates related to this goal by clicking on the "Past Updates" button in the selected goal          The patient verbalized understanding of instructions provided  today and declined a print copy of patient instruction materials.   Telephone follow up appointment with care management team member scheduled for: 9/30 at 1:30pm  Kendra [name], PharmD, [facility] [phone]

## 2019-06-03 NOTE — Chronic Care Management (AMB) (Signed)
Chronic Care Management   Follow Up Note   06/03/2019 Name: Kendra [name] MRN: [id number] DOB: September 11, 1946  Referred by: Kendra College, NP Reason for referral : Chronic Care Management (Patient Phone Call)   Kendra [name] is a 72 y.o. year old female who is a primary care patient of Kendra College, NP. The CCM team was consulted for assistance with chronic disease management and care coordination needs.  Kendra [name] has a medical history which includes but is not limited to recent CVA (11/2018), Type 2 Diabetes, hypertension, hyperlipidemia and COPD.  I reached out to Kendra [name] by phone today.   Review of patient status, including review of consultants reports, relevant laboratory and other test results, and collaboration with appropriate care team members and the patient's provider was performed as part of comprehensive patient evaluation and provision of chronic care management services.    Objective  Lab Results  Component Value Date   HGBA1C 6.0 (A) 05/21/2019    Outpatient Encounter Medications as of 06/03/2019  Medication Sig Note  . acetaminophen (TYLENOL) 500 MG tablet Take 2 tablets (1,000 mg total) by mouth every 8 (eight) hours as needed for mild pain or moderate pain.   [name] albuterol (VENTOLIN HFA) 108 (90 Base) MCG/ACT inhaler INHALE 1 TO 2 PUFFS INTO THE LUNGS EVERY 6 HOURS AS NEEDED FOR WHEEZING OR SHORTNESS OF BREATH   . aspirin 81 MG EC tablet Take 1 tablet (81 mg total) by mouth daily. Swallow whole.   [name] atorvastatin (LIPITOR) 40 MG tablet Take 1 tablet (40 mg total) by mouth daily with supper.   . baclofen (LIORESAL) 10 MG tablet TAKE 1 TABLET BY MOUTH ONCE DAILY AS NEEDED MUSCLE SPASMS   . bisacodyl (DULCOLAX) 5 MG EC tablet Take 5 mg by mouth daily as needed for moderate constipation. 01/30/2019: Reports using 1 tablet (5 mg)/day on 2 days in past week  . Blood Glucose Monitoring Suppl (ONETOUCH VERIO) w/Device KIT 1 kit by Does not apply  route 2 (two) times daily.   [name] buPROPion (WELLBUTRIN XL) 300 MG 24 hr tablet Take 1 tablet (300 mg total) by mouth daily. 06/03/2019: Currently still taking bupropion XL 150 mg once daily  . Cholecalciferol (VITAMIN D3) 50 MCG (2000 UT) capsule Take 1 capsule (2,000 Units total) by mouth daily.   . Fluticasone-Umeclidin-Vilant (TRELEGY ELLIPTA) 100-62.5-25 MCG/INH AEPB Inhale 1 puff into the lungs daily.   [name] glucose blood (ONETOUCH VERIO) test strip Use as instructed   . Insulin Glargine (LANTUS SOLOSTAR) 100 UNIT/ML Solostar Pen INJECT  12 UNITS SUBCUTANEOUSLY EVERY DAY  AT  10PM   . Insulin Pen Needle (BD PEN NEEDLE NANO U/F) 32G X 4 MM MISC USE WITH LANTUS AS DIRECTED   . Lancet Devices (ONE TOUCH DELICA LANCING DEV) MISC 1 Device by Does not apply route 2 (two) times daily.   [name] lisinopril (ZESTRIL) 5 MG tablet Take 1 tablet (5 mg total) by mouth daily. 06/03/2019: Currently still taking lisinopril 2.5 mg daily  . meloxicam (MOBIC) 15 MG tablet Take 1 tablet (15 mg total) by mouth daily as needed (for moderate arthritis pain).   . metFORMIN (GLUCOPHAGE-XR) 500 MG 24 hr tablet Take 1 tablet (500 mg total) by mouth 2 (two) times daily with a meal.   . nicotine (NICODERM CQ - DOSED IN MG/24 HOURS) 14 mg/24hr patch Place onto the skin.   Kendra [name] Delica Lancets 96P MISC 1 Device by Does not apply route 2 (two)  times daily.   . polyethylene glycol powder (GLYCOLAX/MIRALAX) 17 GM/SCOOP powder Take 17-34 g by mouth daily as needed for mild constipation or moderate constipation.    No facility-administered encounter medications on file as of 06/03/2019.     Goals Addressed            This Visit's Progress   . PharmD-Medication Adherence       Current Barriers:  . Financial Barriers . Knowledge deficits related to coordination of her own care . Limited social support  Pharmacist Clinical Goal(s):  [name] Over the next 30 days, patient will demonstrate improved medication adherence as evidenced by  verbalized understanding of prescribed medication regimen, assistance available, and patient report of adherence  Interventions: . Perform chart review o Office visit with PCP on 9/2 - A1C now 6.0% - Instructed to reduce insulin glargine dose to 12 units daily . Per PCP: "IF patient has 7 days in a row less than 120 in the morning, decrease by another 2 units every week or longer interval if [name] Pharmacist] visits are not weekly." - With next pill packaging delivery, patient should expect (prescriptions sent): [name] Increase lisinopril dose to 5 mg daily . Increase Wellbutrin to 300 mg daily . Follow up with Kendra [name] regarding pill packaging from Kendra [name] and medication adherence o Kendra [name] reports she is due for her next set of pill packs from Kendra [name] next week. - Review with patient medication dose changes to expect for bupropion and lisinopril as adjusted by PCP o Denies any missed doses from pill packaging . Review with patient recent blood sugar results (see below) o Reports that she has been taking insulin glargine 13 units QHS, rather than 12 units QHS - Have patient write down direction to take as directed by PCP o Denies recent low blood sugars o Counsel patient on importance of eating regular well balanced meals o Counsel on importance on blood sugar monitoring technique . Counsel on importance of continued blood pressure monitoring and control. o Home AM BP today: 123/83, HR 70 . Encourage patient to schedule eye exam o Kendra [name] notes that she is in need of new eye glasses o Review in-network providers per patient's health plan's website o Patient plans to follow up with Kendra [name] for appointment. Phone number provided . Coordination of care - provide patient with phone contact info for [facility] as requested o Reports headaches have improved, but continues to have crawling sensation in scalp   Patient Self Care Activities:  . Currently  UNABLE to consistently manage medications. Receives some assistance from sons, Wanda and [name], with managing medications o Using weekly pillbox as adherence aid o Alarm on phone set for 10 pm as reminder for insulin adminisration . Calls pharmacy for medication refills . Patient to attend scheduled medical appointments o Upcoming PCP visit scheduled for 9/2 - Remind patient to bring new insurance card with her to visit . Calls provider office for new concerns or questions  . Patient to check blood sugars twice daily and keep log Date Fasting Blood Glucose After Breakfast Notes  9 - September 116 136   10 - September 121 114   11 - September - -   12 - September 89 90   13 - September 80 99   14 - September 121 210* *Not sure why higher.  15 - September 150 140   Average 113 132    . Patient to check blood pressure  daily and keep log o Blood pressure goal: < 130/80     Please see past updates related to this goal by clicking on the "Past Updates" button in the selected goal          Plan  Telephone follow up appointment with care management team member scheduled for: 9/30 at 1:30pm  [name], PharmD, [place] [phone]

## 2019-06-17 ENCOUNTER — Other Ambulatory Visit: Payer: Self-pay

## 2019-06-17 DIAGNOSIS — J449 Chronic obstructive pulmonary disease, unspecified: Secondary | ICD-10-CM

## 2019-06-18 ENCOUNTER — Ambulatory Visit: Payer: Self-pay | Admitting: Pharmacist

## 2019-06-18 MED ORDER — ALBUTEROL SULFATE HFA 108 (90 BASE) MCG/ACT IN AERS: INHALATION_SPRAY | RESPIRATORY_TRACT | 2 refills | Status: DC

## 2019-06-18 NOTE — Patient Instructions (Signed)
Thank you allowing the Chronic Care Management Team to be a part of your care! It was a pleasure speaking with you today!     CCM (Chronic Care Management) Team    [name] RN, BSN Nurse Care Coordinator  [phone]   [name] PharmD  Clinical Pharmacist  [phone]   [name] LCSW Clinical Social Worker [phone]  Visit Information  Goals Addressed            This Visit's Progress   . PharmD-Medication Adherence       Current Barriers:  . Financial Barriers . Knowledge deficits related to coordination of her own care . Limited social support  Pharmacist Clinical Goal(s):  [name] Over the next 30 days, patient will demonstrate improved medication adherence as evidenced by verbalized understanding of prescribed medication regimen, assistance available, and patient report of adherence  Interventions: . Patient reports that she is in need of a new prescription for her albuterol.  o Review chart and advise patient that albuterol inhaler was sent to pharmacy by PCP today . Reschedule appointment o Ms. Ellick asks if we can continue our appointment next week as now is not a good time . Advise patient to follow up with provider office for new medical questions or concerns  Patient Self Care Activities:  . Currently UNABLE to consistently manage medications. Receives some assistance from sons, [place] and [name], with managing medications o Using weekly pillbox as adherence aid o Alarm on phone set for 10 pm as reminder for insulin adminisration . Calls pharmacy for medication refills . Patient to attend scheduled medical appointments o Upcoming PCP visit scheduled for 9/2 - Remind patient to bring new insurance card with her to visit . Calls provider office for new concerns or questions  . Patient to check blood sugars twice daily and keep log . Patient to check blood pressure daily and keep log o Blood pressure goal: < 130/80   Please see past  updates related to this goal by clicking on the "Past Updates" button in the selected goal          The patient verbalized understanding of instructions provided today and declined a print copy of patient instruction materials.   Telephone follow up appointment with care management team member scheduled for: 10/7 at 2:30 pm   [name], PharmD, [place] [phone]

## 2019-06-18 NOTE — Chronic Care Management (AMB) (Signed)
Chronic Care Management   Follow Up Note   06/18/2019 Name: Kendra [name] MRN: [id number] DOB: 27-Mar-1946  Referred by: [name], NP Reason for referral : Chronic Care Management (Patient Phone Call)   Kendra [name] is a 73 y.o. year old female who is a primary care patient of [name], NP. The CCM team was consulted for assistance with chronic disease management and care coordination needs.  Kendra [name] has a medical history which includes but is not limited to recent CVA (11/2018), Type 2 Diabetes, hypertension, hyperlipidemia and COPD.  I reached out to Kendra [name] by phone today as scheduled. Talk to patient briefly. States that now is not a good time and asks if we can talk further next week.  Review of patient status, including review of consultants reports, relevant laboratory and other test results, and collaboration with appropriate care team members and the patient's provider was performed as part of comprehensive patient evaluation and provision of chronic care management services.     Outpatient Encounter Medications as of 06/18/2019  Medication Sig Note  . acetaminophen (TYLENOL) 500 MG tablet Take 2 tablets (1,000 mg total) by mouth every 8 (eight) hours as needed for mild pain or moderate pain.   [name] albuterol (VENTOLIN HFA) 108 (90 Base) MCG/ACT inhaler INHALE 1 TO 2 PUFFS INTO THE LUNGS EVERY 6 HOURS AS NEEDED FOR WHEEZING OR SHORTNESS OF BREATH   . aspirin 81 MG EC tablet Take 1 tablet (81 mg total) by mouth daily. Swallow whole.   [name] atorvastatin (LIPITOR) 40 MG tablet Take 1 tablet (40 mg total) by mouth daily with supper.   . baclofen (LIORESAL) 10 MG tablet TAKE 1 TABLET BY MOUTH ONCE DAILY AS NEEDED MUSCLE SPASMS   . bisacodyl (DULCOLAX) 5 MG EC tablet Take 5 mg by mouth daily as needed for moderate constipation. 01/30/2019: Reports using 1 tablet (5 mg)/day on 2 days in past week  . Blood Glucose Monitoring Suppl (ONETOUCH VERIO)  w/Device KIT 1 kit by Does not apply route 2 (two) times daily.   [name] buPROPion (WELLBUTRIN XL) 300 MG 24 hr tablet Take 1 tablet (300 mg total) by mouth daily. 06/03/2019: Currently still taking bupropion XL 150 mg once daily  . Cholecalciferol (VITAMIN D3) 50 MCG (2000 UT) capsule Take 1 capsule (2,000 Units total) by mouth daily.   . Fluticasone-Umeclidin-Vilant (TRELEGY ELLIPTA) 100-62.5-25 MCG/INH AEPB Inhale 1 puff into the lungs daily.   [name] glucose blood (ONETOUCH VERIO) test strip Use as instructed   . Insulin Glargine (LANTUS SOLOSTAR) 100 UNIT/ML Solostar Pen INJECT  12 UNITS SUBCUTANEOUSLY EVERY DAY  AT  10PM   . Insulin Pen Needle (BD PEN NEEDLE NANO U/F) 32G X 4 MM MISC USE WITH LANTUS AS DIRECTED   . Lancet Devices (ONE TOUCH DELICA LANCING DEV) MISC 1 Device by Does not apply route 2 (two) times daily.   [name] lisinopril (ZESTRIL) 5 MG tablet Take 1 tablet (5 mg total) by mouth daily. 06/03/2019: Currently still taking lisinopril 2.5 mg daily  . meloxicam (MOBIC) 15 MG tablet Take 1 tablet (15 mg total) by mouth daily as needed (for moderate arthritis pain).   . metFORMIN (GLUCOPHAGE-XR) 500 MG 24 hr tablet Take 1 tablet (500 mg total) by mouth 2 (two) times daily with a meal.   . nicotine (NICODERM CQ - DOSED IN MG/24 HOURS) 14 mg/24hr patch Place onto the skin.   Kendra [name] MISC 1 Device by  Does not apply route 2 (two) times daily.   . polyethylene glycol powder (GLYCOLAX/MIRALAX) 17 GM/SCOOP powder Take 17-34 g by mouth daily as needed for mild constipation or moderate constipation.    No facility-administered encounter medications on file as of 06/18/2019.     Goals Addressed            This Visit's Progress   . PharmD-Medication Adherence       Current Barriers:  . Financial Barriers . Knowledge deficits related to coordination of her own care . Limited social support  Pharmacist Clinical Goal(s):  [name] Over the next 30 days, patient will demonstrate improved  medication adherence as evidenced by verbalized understanding of prescribed medication regimen, assistance available, and patient report of adherence  Interventions: . Patient reports that she is in need of a new prescription for her albuterol.  o Review chart and advise patient that albuterol inhaler was sent to pharmacy by PCP today . Reschedule appointment o Ms. Louis asks if we can continue our appointment next week as now is not a good time . Advise patient to follow up with provider office for new medical questions or concerns  Patient Self Care Activities:  . Currently UNABLE to consistently manage medications. Receives some assistance from sons, [place] and [name], with managing medications o Using weekly pillbox as adherence aid o Alarm on phone set for 10 pm as reminder for insulin adminisration . Calls pharmacy for medication refills . Patient to attend scheduled medical appointments o Upcoming PCP visit scheduled for 9/2 - Remind patient to bring new insurance card with her to visit . Calls provider office for new concerns or questions  . Patient to check blood sugars twice daily and keep log . Patient to check blood pressure daily and keep log o Blood pressure goal: < 130/80   Please see past updates related to this goal by clicking on the "Past Updates" button in the selected goal          Plan  Telephone follow up appointment with care management team member rescheduled for: 10/7 at 2:30 pm   [name], PharmD, [place] [phone]

## 2019-06-20 ENCOUNTER — Other Ambulatory Visit: Payer: Self-pay | Admitting: Nurse Practitioner

## 2019-06-20 DIAGNOSIS — IMO0002 Reserved for concepts with insufficient information to code with codable children: Secondary | ICD-10-CM

## 2019-06-20 DIAGNOSIS — F331 Major depressive disorder, recurrent, moderate: Secondary | ICD-10-CM

## 2019-06-20 DIAGNOSIS — E1165 Type 2 diabetes mellitus with hyperglycemia: Secondary | ICD-10-CM

## 2019-06-20 DIAGNOSIS — M19011 Primary osteoarthritis, right shoulder: Secondary | ICD-10-CM

## 2019-06-23 ENCOUNTER — Ambulatory Visit (INDEPENDENT_AMBULATORY_CARE_PROVIDER_SITE_OTHER): Payer: Medicare Other | Admitting: *Deleted

## 2019-06-23 DIAGNOSIS — Z794 Long term (current) use of insulin: Secondary | ICD-10-CM

## 2019-06-23 DIAGNOSIS — F17200 Nicotine dependence, unspecified, uncomplicated: Secondary | ICD-10-CM

## 2019-06-23 DIAGNOSIS — E118 Type 2 diabetes mellitus with unspecified complications: Secondary | ICD-10-CM

## 2019-06-23 DIAGNOSIS — F3341 Major depressive disorder, recurrent, in partial remission: Secondary | ICD-10-CM | POA: Diagnosis not present

## 2019-06-23 DIAGNOSIS — I10 Essential (primary) hypertension: Secondary | ICD-10-CM

## 2019-06-23 NOTE — Chronic Care Management (AMB) (Signed)
Chronic Care Management   Follow Up Note   06/23/2019 Name: Kendra [name] MRN: [id number] DOB: March 19, 1946  Referred by: [name], NP Reason for referral : Chronic Care Management (HTN, DM, Headaches) and Care Coordination (Eye MD appt, Neuro Appt)   Kendra [name] is a 73 y.o. year old female who is a primary care patient of [name], NP. The CCM team was consulted for assistance with chronic disease management and care coordination needs.    Review of patient status, including review of consultants reports, relevant laboratory and other test results, and collaboration with appropriate care team members and the patient's provider was performed as part of comprehensive patient evaluation and provision of chronic care management services.    SDOH (Social Determinants of Health) screening performed today: Air cabin crew Strain  Depression   Tobacco Use Stress Physical Activity. See Care Plan for related entries.   Advanced Directives Status: N See Care Plan and Vynca application for related entries.  Outpatient Encounter Medications as of 06/23/2019  Medication Sig Note  . acetaminophen (TYLENOL) 500 MG tablet Take 2 tablets (1,000 mg total) by mouth every 8 (eight) hours as needed for mild pain or moderate pain.   [name] albuterol (VENTOLIN HFA) 108 (90 Base) MCG/ACT inhaler INHALE 1 TO 2 PUFFS INTO THE LUNGS EVERY 6 HOURS AS NEEDED FOR WHEEZING OR SHORTNESS OF BREATH   . aspirin 81 MG EC tablet Take 1 tablet (81 mg total) by mouth daily. Swallow whole.   [name] atorvastatin (LIPITOR) 40 MG tablet Take 1 tablet (40 mg total) by mouth daily with supper.   . baclofen (LIORESAL) 10 MG tablet TAKE 1 TABLET BY MOUTH ONCE DAILY AS NEEDED MUSCLE SPASMS   . bisacodyl (DULCOLAX) 5 MG EC tablet Take 5 mg by mouth daily as needed for moderate constipation. 01/30/2019: Reports using 1 tablet (5 mg)/day on 2 days in past week  . Blood Glucose Monitoring Suppl (ONETOUCH  VERIO) w/Device KIT 1 kit by Does not apply route 2 (two) times daily.   [name] buPROPion (WELLBUTRIN XL) 300 MG 24 hr tablet Take 1 tablet (300 mg total) by mouth daily. 06/03/2019: Currently still taking bupropion XL 150 mg once daily  . Cholecalciferol (VITAMIN D3) 50 MCG (2000 UT) capsule Take 1 capsule (2,000 Units total) by mouth daily.   . citalopram (CELEXA) 40 MG tablet TAKE 1 TABLET BY MOUTH EVERY DAY   . Fluticasone-Umeclidin-Vilant (TRELEGY ELLIPTA) 100-62.5-25 MCG/INH AEPB Inhale 1 puff into the lungs daily.   [name] glucose blood (ONETOUCH VERIO) test strip Use as instructed   . Insulin Glargine (LANTUS SOLOSTAR) 100 UNIT/ML Solostar Pen INJECT  12 UNITS SUBCUTANEOUSLY EVERY DAY  AT  10PM   . Insulin Pen Needle (BD PEN NEEDLE NANO U/F) 32G X 4 MM MISC USE WITH LANTUS AS DIRECTED   . Lancet Devices (ONE TOUCH DELICA LANCING DEV) MISC 1 Device by Does not apply route 2 (two) times daily.   [name] lisinopril (ZESTRIL) 5 MG tablet Take 1 tablet (5 mg total) by mouth daily. 06/03/2019: Currently still taking lisinopril 2.5 mg daily  . meloxicam (MOBIC) 15 MG tablet TAKE 1 TABLET (15 MG TOTAL) BY MOUTH DAILY AS NEEDED (FOR ARTHRITIS PAIN).   [name] metFORMIN (GLUCOPHAGE-XR) 500 MG 24 hr tablet Take 1 tablet (500 mg total) by mouth 2 (two) times daily with a meal.   . nicotine (NICODERM CQ - DOSED IN MG/24 HOURS) 14 mg/24hr patch Place onto the skin.   [name]  Delica Lancets 33G MISC 1 Device by Does not apply route 2 (two) times daily.   . polyethylene glycol powder (GLYCOLAX/MIRALAX) 17 GM/SCOOP powder Take 17-34 g by mouth daily as needed for mild constipation or moderate constipation.    No facility-administered encounter medications on file as of 06/23/2019.      Goals Addressed            This Visit's Progress   . RNCM-My headaches are worse (pt-stated)       Current Barriers:  . Knowledge Deficits related to basic understanding of hypertension pathophysiology and self care management .  Knowledge Deficits related to understanding of medications prescribed for management of hypertension . Non-adherence to prescribed medication regimen . Financial Constraints.   Case Manager Clinical Goal(s):  . Over the next 30 days, patient will attend all scheduled medical appointments: PCP 08/20/19 [facility] 07/11/19 @ 10am General Neurology Oct 12 2pm with Kathy Patel(Video visit)  . Over the next 90 days, patient will demonstrate improved adherence to prescribed treatment plan for hypertension as evidenced by taking all medications as prescribed, monitoring and recording blood pressure as directed, adhering to low sodium/DASH diet  Interventions:  . UNABLE to independently:manage increased worsening headaches . Provided education to patient re: stroke prevention, s/s of heart attack and stroke, DASH diet, complications of uncontrolled blood pressure . Reviewed medications with patient and discussed importance of compliance . Discussed plans with patient for ongoing care management follow up and provided patient with direct contact information for care management team . Advised patient, providing education and rationale, to monitor blood pressure daily and record, calling PCP for findings outside established parameters.  . Reviewed scheduled/upcoming provider appointments including:  PCP 08/20/19 [facility] 07/11/19 @ 10am General Neurology Oct 12 2pm with Kathy Patel(Video visit)  . Patient states she needs assistance in obtaining eye doctor appt and neurology appointment-  . Call made to [facility] who stated co-pay would be 75$, patient stating this is too much. Called [facility]- Co-pay 30-45$(possibly 0$ co-pay based on what is found medically) 300$ Glasses allowance. Made patient aware of this she prefers [facility].  . Made appointment with [facility]- Friday 0ct 23@ 10am- Patient aware. [address] [place] [phone]- Confirmed patient had  transportation. . Also called [facility] to assist with appointment- Tasha stated patient has a video visit scheduled for 0ct 12 @ 2 pm with Kathy Patel- Made patient aware of this.  . Patient continues to be diligent with obtaining b/ps and CBGs.  . Patient reports blood pressures  and blood sugars as follows:  . 06/23/19 . 06/22/19 . CBG .  AM  . 132/76 . AM . 146/86 . 10/5 . 183fasting .  pulse . 83 . Pulse . 75 . 10/4 . 98 fasting .  Discussed tobacco cessation: noted neurology stated they called in patches, patient states she was unaware of this but would like to stop smoking-.Made her aware I will talk to CCM pharmacist about this  . She stated on 10/1 she was very upset and b/p was 172/101 p84- Discussed ways to decrease stress such as exercise. We also discussed if her medications were helping her feel less depressed/upset she stated they were.  . Patient requested more Glucerna coupons.   Patient Self Care Activities:  . UNABLE to independently manage worsening headaches . Checks BP and records as discussed  Please see past updates related to this goal by clicking on the "  Past Updates" button in the selected goal           The care management team will reach out to the patient again over the next 30 days.  The patient has been provided with contact information for the care management team and has been advised to call with any health related questions or concerns.    [name] RN, BSN Nurse Case Manager [facility] Care Management  ([phone]) Business Mobile   

## 2019-06-23 NOTE — Patient Instructions (Signed)
Thank you allowing the Chronic Care Management Team to be a part of your care! It was a pleasure speaking with you today!   CCM (Chronic Care Management) Team   Kora Groom RN, BSN Nurse Care Coordinator  [phone]  [name] PharmD  Clinical Pharmacist  [phone]  [name] LCSW Clinical Social Worker [phone]  Goals Addressed            This Visit's Progress   . RNCM-My headaches are worse (pt-stated)       Current Barriers:  [name] Knowledge Deficits related to basic understanding of hypertension pathophysiology and self care management . Knowledge Deficits related to understanding of medications prescribed for management of hypertension . Non-adherence to prescribed medication regimen . Film/video editor.   Case Manager Clinical Goal(s):  [name] Over the next 30 days, patient will attend all scheduled medical appointments: PCP 08/20/19 [facility] 07/11/19 @ 10am General Neurology Oct 12 2pm with Juliann Pulse Patel(Video visit)  . Over the next 90 days, patient will demonstrate improved adherence to prescribed treatment plan for hypertension as evidenced by taking all medications as prescribed, monitoring and recording blood pressure as directed, adhering to low sodium/DASH diet  Interventions:  [name] UNABLE to independently:manage increased worsening headaches . Provided education to patient re: stroke prevention, s/s of heart attack and stroke, DASH diet, complications of uncontrolled blood pressure . Reviewed medications with patient and discussed importance of compliance . Discussed plans with patient for ongoing care management follow up and provided patient with direct contact information for care management team . Advised patient, providing education and rationale, to monitor blood pressure daily and record, calling PCP for findings outside established parameters.  . Reviewed scheduled/upcoming provider appointments including:  PCP 08/20/19 [facility] 07/11/19 @ 10am General Neurology Oct 12 2pm with Juliann Pulse Patel(Video visit)  . Patient states she needs assistance in obtaining eye doctor appt and neurology appointment-  . Call made to [facility] who stated co-pay would be 75$, patient stating this is too much. [facility] 30-45$(possibly 0$ co-pay based on what is found medically) 300$ Glasses allowance. Made patient aware of this she prefers [facility], early years/pre.  . Made appointment with [name]- Friday 0ct 23@ 10am- Patient aware. [place] [phone]- Confirmed patient had transportation. . Also called [facility] to assist with appointment- Aniceto Boss stated patient has a video visit scheduled for 0ct 12 @ 2 pm with [place] patient aware of this.  . Patient continues to be diligent with obtaining b/ps and CBGs.  . Patient reports blood pressures  and blood sugars as follows:  . 06/23/19 . 06/22/19 . CBG .  AM  . 132/76 . AM . 146/86 . 10/5 . 155fasting .  pulse . 83 . Pulse . 75 . 10/4 . 98 fasting .  Discussed tobacco cessation: noted neurology stated they called in patches, patient states she was unaware of this but would like to stop smoking-.Made her aware I will talk to [facility] pharmacist about this  . She stated on 10/1 she was very upset and b/p was 172/101 p84- Discussed ways to decrease stress such as exercise. We also discussed if her medications were helping her feel less depressed/upset she stated they were.  . Patient requested more Glucerna coupons.   Patient Self Care Activities:  . UNABLE to independently manage worsening headaches . Checks BP and records as discussed  Please see past updates related to this goal  by clicking on the "Past Updates" button in the selected goal          The patient verbalized understanding of instructions provided today and declined a print copy of patient instruction materials.   The patient has been provided with contact  information for the care management team and has been advised to call with any health related questions or concerns.

## 2019-06-25 ENCOUNTER — Ambulatory Visit: Payer: Self-pay | Admitting: Pharmacist

## 2019-06-25 DIAGNOSIS — I10 Essential (primary) hypertension: Secondary | ICD-10-CM | POA: Diagnosis not present

## 2019-06-25 DIAGNOSIS — E118 Type 2 diabetes mellitus with unspecified complications: Secondary | ICD-10-CM

## 2019-06-25 DIAGNOSIS — Z794 Long term (current) use of insulin: Secondary | ICD-10-CM | POA: Diagnosis not present

## 2019-06-25 DIAGNOSIS — F17200 Nicotine dependence, unspecified, uncomplicated: Secondary | ICD-10-CM

## 2019-06-25 NOTE — Chronic Care Management (AMB) (Addendum)
Chronic Care Management   Follow Up Note   06/25/2019 Name: Kendra [name] MRN: [id number] DOB: 1945-10-27  Referred by: Kendra [name] Reason for referral : Chronic Care Management (Patient Phone Call)   Kendra [name] is a 73 y.o. year old female who is a primary care patient of Kendra [name]. The CCM team was consulted for assistance with chronic disease management and care coordination needs.  Kendra [name] has a medical history which includes but is not limited to recent CVA (11/2018), Type 2 Diabetes, hypertension, hyperlipidemia and COPD.  I reached out to Kendra [name] by phone today.   Review of patient status, including review of consultants reports, relevant laboratory and other test results, and collaboration with appropriate care team members and the patient's provider was performed as part of comprehensive patient evaluation and provision of chronic care management services.     Outpatient Encounter Medications as of 06/25/2019  Medication Sig Note  . Insulin Glargine (LANTUS SOLOSTAR) 100 UNIT/ML Solostar Pen INJECT  12 UNITS SUBCUTANEOUSLY EVERY DAY  AT  10PM   . metFORMIN (GLUCOPHAGE-XR) 500 MG 24 hr tablet Take 1 tablet (500 mg total) by mouth 2 (two) times daily with a meal.   . acetaminophen (TYLENOL) 500 MG tablet Take 2 tablets (1,000 mg total) by mouth every 8 (eight) hours as needed for mild pain or moderate pain.   [name] albuterol (VENTOLIN HFA) 108 (90 Base) MCG/ACT inhaler INHALE 1 TO 2 PUFFS INTO THE LUNGS EVERY 6 HOURS AS NEEDED FOR WHEEZING OR SHORTNESS OF BREATH   . aspirin 81 MG EC tablet Take 1 tablet (81 mg total) by mouth daily. Swallow whole.   [name] atorvastatin (LIPITOR) 40 MG tablet Take 1 tablet (40 mg total) by mouth daily with supper.   . baclofen (LIORESAL) 10 MG tablet TAKE 1 TABLET BY MOUTH ONCE DAILY AS NEEDED MUSCLE SPASMS   . bisacodyl (DULCOLAX) 5 MG EC tablet Take 5 mg by mouth daily as needed for moderate constipation.  01/30/2019: Reports using 1 tablet (5 mg)/day on 2 days in past week  . Blood Glucose Monitoring Suppl (ONETOUCH VERIO) w/Device KIT 1 kit by Does not apply route 2 (two) times daily.   [name] buPROPion (WELLBUTRIN XL) 300 MG 24 hr tablet Take 1 tablet (300 mg total) by mouth daily. 06/03/2019: Currently still taking bupropion XL 150 mg once daily  . Cholecalciferol (VITAMIN D3) 50 MCG (2000 UT) capsule Take 1 capsule (2,000 Units total) by mouth daily.   . citalopram (CELEXA) 40 MG tablet TAKE 1 TABLET BY MOUTH EVERY DAY   . Fluticasone-Umeclidin-Vilant (TRELEGY ELLIPTA) 100-62.5-25 MCG/INH AEPB Inhale 1 puff into the lungs daily.   [name] glucose blood (ONETOUCH VERIO) test strip Use as instructed   . Insulin Pen Needle (BD PEN NEEDLE NANO U/F) 32G X 4 MM MISC USE WITH LANTUS AS DIRECTED   . Lancet Devices (ONE TOUCH DELICA LANCING DEV) MISC 1 Device by Does not apply route 2 (two) times daily.   [name] lisinopril (ZESTRIL) 5 MG tablet Take 1 tablet (5 mg total) by mouth daily. 06/03/2019: Currently still taking lisinopril 2.5 mg daily  . meloxicam (MOBIC) 15 MG tablet TAKE 1 TABLET (15 MG TOTAL) BY MOUTH DAILY AS NEEDED (FOR ARTHRITIS PAIN).   [name] nicotine (NICODERM CQ - DOSED IN MG/24 HOURS) 14 mg/24hr patch Place onto the skin.   [name] Lancets 62X MISC 1 Device by Does not apply route 2 (two) times daily.   [name]  polyethylene glycol powder (GLYCOLAX/MIRALAX) 17 GM/SCOOP powder Take 17-34 g by mouth daily as needed for mild constipation or moderate constipation.    No facility-administered encounter medications on file as of 06/25/2019.     Goals Addressed            This Visit's Progress   . PharmD-Medication Adherence       Current Barriers:  . Financial Barriers . Knowledge deficits related to coordination of her own care . Limited social support  Pharmacist Clinical Goal(s):  [name] Over the next 30 days, patient will demonstrate improved medication adherence as evidenced by verbalized  understanding of prescribed medication regimen, assistance available, and patient report of adherence  Interventions:  Follow up with Kendra [name] regarding pill packaging from Kendra [name] and medication adherence ? Denies any missed doses from pill packaging  Review recent blood sugar results (see below) ? Reports taking: ? Lantus 12 units QHS ? Metformin ER 500 mg BID from pill pack ? Note PCP directed at last visit, "IF patient has 7 days in a row less than 120 in the morning, decrease by another 2 units every week or longer" ? Based on provide CBG results, advise patient to decrease her Lantus dose to 10 units QHS. Patient demonstrates understanding via teach back method and writes down change. ? Counsel on blood sugar monitoring technique, including washing hands under warm water before checking  Counsel on importance of continued blood pressure monitoring and control. ? Reports taking lisinopril from pill pack daily ? Review recent blood pressure results (see below  Place coordination of care call to Kendra [name], speak with Kendra [name] ? Note PCP sent prescription on 9/2 to increase patient's lisinopril dose from 2.5 mg daily to 5 mg daily with next pill pack delivery ? Reports pharmacy did NOT update the pill pack for increase in lisinopril dose when last sent out.  ? Pharmacy will make this change for upcoming pill packs that go out to patient on Tuesday.  Follow up with patient regarding smoking cessation. Kendra [name] reports that she is ready to quit smoking. ? Reports that she currently smokes ~7 cigarettes/day ? Note patient's Neurologist has sent in a prescription for nicotine patches for patient to pharmacy, but patches not covered through Part D plan ? Nicotine patches available through Kendra [name] over the counter (OTC) benefit ? Assist patient with locating nicotine patches in OTC catalogue  ? Advise patient to order the nicotine 14 mg/day patches ? Counsel patient on nicotine  replacement therapy ? Encourage patient to follow up again with Kendra [name] for further support. Provide patient phone number again.  Patient Self Care Activities:  . Currently UNABLE to consistently manage medications. Receives some assistance from sons, [place] and [name], with managing medications o Using weekly pillbox as adherence aid o Alarm on phone set for 10 pm as reminder for insulin adminisration . Calls pharmacy for medication refills . Patient to attend scheduled medical appointments . Calls provider office for new concerns or questions  . Patient to check blood sugars twice daily and keep log Date Fasting Blood Glucose After Breakfast (~1-2 hours)  1 - October - -  2 - October 96 146  3 - October - -  4 - October 98 140  5 - October 103 111  6 - October 78 167  7 - October 103 -   . Patient to check blood pressure daily and keep log Date Blood Pressure  4 - October 146/86, HR  75  5 - October 132/76, HR 83  6 - October 142/93, HR 60  7 - October - o  Blood pressure goal: < 130/80   Please see past updates related to this goal by clicking on the "Past Updates" button in the selected goal          Plan  Telephone follow up appointment with care management team member scheduled for: 10/15 at 2 pm  [name], PharmD, [place] [phone]

## 2019-06-25 NOTE — Patient Instructions (Signed)
Thank you allowing the Chronic Care Management Team to be a part of your care! It was a pleasure speaking with you today!     CCM (Chronic Care Management) Team    [name] RN, BSN Nurse Care Coordinator  [phone]   [name] PharmD  Clinical Pharmacist  [phone]   [name] LCSW Clinical Social Worker [phone]  Visit Information  Goals Addressed            This Visit's Progress   . PharmD-Medication Adherence       Current Barriers:  . Financial Barriers . Knowledge deficits related to coordination of her own care . Limited social support  Pharmacist Clinical Goal(s):  [name] Over the next 30 days, patient will demonstrate improved medication adherence as evidenced by verbalized understanding of prescribed medication regimen, assistance available, and patient report of adherence  Interventions:  Follow up with Ms. Passe regarding pill packaging from [employer] and medication adherence ? Denies any missed doses from pill packaging  Review recent blood sugar results (see below) ? Reports taking: ? Lantus 12 units QHS ? Metformin ER 500 mg BID from pill pack ? Note PCP directed at last visit, "IF patient has 7 days in a row less than 120 in the morning, decrease by another 2 units every week or longer" ? Based on provide CBG results, advise patient to decrease her Lantus dose to 10 units QHS. Patient demonstrates understanding via teach back method and writes down change. ? Counsel on blood sugar monitoring technique, including washing hands under warm water before checking  Counsel on importance of continued blood pressure monitoring and control. ? Reports taking lisinopril from pill pack daily ? Review recent blood pressure results (see below  Place coordination of care call to [facility], speak with Maxine ? Note PCP sent prescription on 9/2 to increase patient's lisinopril dose from 2.5 mg daily to 5 mg daily with next pill pack  delivery ? Reports pharmacy did NOT update the pill pack for increase in lisinopril dose when last sent out.  ? Pharmacy will make this change for upcoming pill packs that go out to patient on Tuesday.  Follow up with patient regarding smoking cessation. Ms. Bonnet reports that she is ready to quit smoking. ? Reports that she currently smokes ~7 cigarettes/day ? Note patient's Neurologist has sent in a prescription for nicotine patches for patient to pharmacy, but patches not covered through Part D plan ? Nicotine patches available through [facility] over the counter (OTC) benefit ? Assist patient with locating nicotine patches in OTC catalogue  ? Advise patient to order the nicotine 14 mg/day patches ? Counsel patient on nicotine replacement therapy ? Encourage patient to follow up again with [facility] for further support. Provide patient phone number again.  Patient Self Care Activities:  . Currently UNABLE to consistently manage medications. Receives some assistance from sons, [place] and [name], with managing medications o Using weekly pillbox as adherence aid o Alarm on phone set for 10 pm as reminder for insulin adminisration . Calls pharmacy for medication refills . Patient to attend scheduled medical appointments . Calls provider office for new concerns or questions  . Patient to check blood sugars twice daily and keep log . Patient to check blood pressure daily and keep log o Blood pressure goal: < 130/80   Please see past updates related to this goal by clicking on the "Past Updates" button in the selected goal  The patient verbalized understanding of instructions provided today and declined a print copy of patient instruction materials.   Telephone follow up appointment with care management team member scheduled for: 10/15 at 2 pm  [name], PharmD, [place] [phone]

## 2019-06-26 ENCOUNTER — Ambulatory Visit: Payer: Self-pay | Admitting: Licensed Clinical Social Worker

## 2019-06-26 DIAGNOSIS — J449 Chronic obstructive pulmonary disease, unspecified: Secondary | ICD-10-CM

## 2019-06-26 DIAGNOSIS — F324 Major depressive disorder, single episode, in partial remission: Secondary | ICD-10-CM | POA: Diagnosis not present
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
## 2019-07-03 ENCOUNTER — Ambulatory Visit: Payer: Self-pay | Admitting: Pharmacist

## 2019-07-03 DIAGNOSIS — I10 Essential (primary) hypertension: Secondary | ICD-10-CM | POA: Diagnosis not present

## 2019-07-03 DIAGNOSIS — Z794 Long term (current) use of insulin: Secondary | ICD-10-CM

## 2019-07-03 DIAGNOSIS — J449 Chronic obstructive pulmonary disease, unspecified: Secondary | ICD-10-CM

## 2019-07-03 DIAGNOSIS — E118 Type 2 diabetes mellitus with unspecified complications: Secondary | ICD-10-CM

## 2019-07-03 NOTE — Patient Instructions (Signed)
Thank you allowing the Chronic Care Management Team to be a part of your care! It was a pleasure speaking with you today!     CCM (Chronic Care Management) Team    [name] RN, BSN Nurse Care Coordinator  [phone]   [name] PharmD  Clinical Pharmacist  [phone]   [name] LCSW Clinical Social Worker [phone]  Visit Information  Goals Addressed            This Visit's Progress   . PharmD-Medication Adherence       Current Barriers:  . Financial Barriers . Knowledge deficits related to coordination of her own care . Limited social support  Pharmacist Clinical Goal(s):  [name] Over the next 30 days, patient will demonstrate improved medication adherence as evidenced by verbalized understanding of prescribed medication regimen, assistance available, and patient report of adherence  Interventions:  Follow up with Ms. Henly regarding pill packaging from [employer] and medication adherence ? Denies any missed doses from pill packaging; reports receiving new pill packs this week ? Remind Ms. Termini to use maintenance inhaler (Trelegy) daily (and rinse out mouth)  Counsel on importance of blood sugar control and monitoring ? Review recent blood sugar results (see below) ? Denies any low blood sugars ? Reports taking: ? Lantus 10 units QHS ? Metformin ER 500 mg BID from pill pack ? Counsel on blood sugar monitoring technique, including consistently checking post-meal levels ~2 hours after meal ? Encourage patient to continue to eat regular meals, including breakfast, but counsel on controlling carbohydrate portion size  Counsel on importance of continued blood pressure monitoring and control. ? Reports taking lisinopril from pill pack daily ? Review recent blood pressure results (see below)  Follow up with patient regarding smoking cessation. Ms. Ducre reports that she has reduced her smoking and has decided not to buy another  pack ? Reports now using nicotine patches, obtained through [facility] over the counter (OTC) benefit and has started using these as directed. ? Discuss current triggers for cravings and strategies from managing these ? Going outside when people smoking inside her home ? Encouraging patient to share with her family her intention to quit and requesting their support, including by not smoking around her ? Discuss benefits of smoking cessation including cardiovascular heatlh, lung health and cost savings  Discussed plans with patient for ongoing care management follow up and provided patient with direct contact information for care management team  Patient Self Care Activities:  . Currently UNABLE to consistently manage medications. Receives some assistance from sons, [place] and [name], with managing medications o Using pill packaging from [facility] o Alarm on phone set for 10 pm as reminder for insulin adminisration . Calls pharmacy for medication refills . Patient to attend scheduled medical appointments . Calls provider office for new concerns or questions  . Patient to check blood sugars twice daily and keep log Date Fasting Blood Glucose After Breakfast Notes  10 - October 116    11 - October 111    12 - October 89 151   13 - October 141 120   14 - October 112 240* *Had large portions of carbohydrates  15 - October 101 -   Average 112     . Patient to check blood pressure daily and keep log Date Blood Pressure  10 - October 114/73, HR 70  11 - October 109/77, HR 73  12 - October 120/82, HR 69  13 - October  133/85, HR 71  14 - October 119/78, HR 74  15 - October 156/86, HR 76 -  Blood pressure goal: < 130/80   Please see past updates related to this goal by clicking on the "Past Updates" button in the selected goal          The patient verbalized understanding of instructions provided today and declined a print copy of patient instruction materials.   Telephone follow up  appointment with care management team member scheduled for:  10/23 at 1:30pm  [name], PharmD, [facility] [phone]

## 2019-07-03 NOTE — Chronic Care Management (AMB) (Signed)
Chronic Care Management   Follow Up Note   07/03/2019 Name: Kendra [name] MRN: [id number] DOB: 02-25-1946  Referred by: Kendra College, NP Reason for referral : Chronic Care Management (Patient [name] Call)   Kendra [name] is a 73 y.o. year old female who is a primary care patient of Kendra College, NP. The CCM team was consulted for assistance with chronic disease management and care coordination needs.  Kendra [name] has a medical history which includes but is not limited to recent CVA (11/2018), Type 2 Diabetes, hypertension, hyperlipidemia and COPD.  I reached out to Kendra [name] today.   Review of patient status, including review of consultants reports, relevant laboratory and other test results, and collaboration with appropriate care team members and the patient's provider was performed as part of comprehensive patient evaluation and provision of chronic care management services.     Outpatient Encounter Medications as of 07/03/2019  Medication Sig Note  . Fluticasone-Umeclidin-Vilant (TRELEGY ELLIPTA) 100-62.5-25 MCG/INH AEPB Inhale 1 puff into the lungs daily.   . Insulin Glargine (LANTUS SOLOSTAR) 100 UNIT/ML Solostar Pen INJECT  12 UNITS SUBCUTANEOUSLY EVERY DAY  AT  10PM (Patient taking differently: INJECT  10 UNITS SUBCUTANEOUSLY EVERY DAY  AT  10PM)   . nicotine (NICODERM CQ - DOSED IN MG/24 HOURS) 14 mg/24hr patch Place onto the skin.   [name] acetaminophen (TYLENOL) 500 MG tablet Take 2 tablets (1,000 mg total) by mouth every 8 (eight) hours as needed for mild pain or moderate pain.   [name] albuterol (VENTOLIN HFA) 108 (90 Base) MCG/ACT inhaler INHALE 1 TO 2 PUFFS INTO THE LUNGS EVERY 6 HOURS AS NEEDED FOR WHEEZING OR SHORTNESS OF BREATH   . aspirin 81 MG EC tablet Take 1 tablet (81 mg total) by mouth daily. Swallow whole.   [name] atorvastatin (LIPITOR) 40 MG tablet Take 1 tablet (40 mg total) by mouth daily with supper.   . baclofen (LIORESAL) 10 MG  tablet TAKE 1 TABLET BY MOUTH ONCE DAILY AS NEEDED MUSCLE SPASMS   . bisacodyl (DULCOLAX) 5 MG EC tablet Take 5 mg by mouth daily as needed for moderate constipation. 01/30/2019: Reports using 1 tablet (5 mg)/day on 2 days in past week  . Blood Glucose Monitoring Suppl (ONETOUCH VERIO) w/Device KIT 1 kit by Does not apply route 2 (two) times daily.   [name] buPROPion (WELLBUTRIN XL) 300 MG 24 hr tablet Take 1 tablet (300 mg total) by mouth daily. 06/03/2019: Currently still taking bupropion XL 150 mg once daily  . Cholecalciferol (VITAMIN D3) 50 MCG (2000 UT) capsule Take 1 capsule (2,000 Units total) by mouth daily.   . citalopram (CELEXA) 40 MG tablet TAKE 1 TABLET BY MOUTH EVERY DAY   . glucose blood (ONETOUCH VERIO) test strip Use as instructed   . Insulin Pen Needle (BD PEN NEEDLE NANO U/F) 32G X 4 MM MISC USE WITH LANTUS AS DIRECTED   . Lancet Devices (ONE TOUCH DELICA LANCING DEV) MISC 1 Device by Does not apply route 2 (two) times daily.   [name] lisinopril (ZESTRIL) 5 MG tablet Take 1 tablet (5 mg total) by mouth daily. 06/03/2019: Currently still taking lisinopril 2.5 mg daily  . meloxicam (MOBIC) 15 MG tablet TAKE 1 TABLET (15 MG TOTAL) BY MOUTH DAILY AS NEEDED (FOR ARTHRITIS PAIN).   [name] metFORMIN (GLUCOPHAGE-XR) 500 MG 24 hr tablet Take 1 tablet (500 mg total) by mouth 2 (two) times daily with a meal.   . Kendra [name]  Lancets 33G MISC 1 Device by Does not apply route 2 (two) times daily.   . polyethylene glycol powder (GLYCOLAX/MIRALAX) 17 GM/SCOOP powder Take 17-34 g by mouth daily as needed for mild constipation or moderate constipation.    No facility-administered encounter medications on file as of 07/03/2019.     Goals Addressed            This Visit's Progress   . PharmD-Medication Adherence       Current Barriers:  . Financial Barriers . Knowledge deficits related to coordination of her own care . Limited social support  Pharmacist Clinical Goal(s):  [name] Over the next 30 days,  patient will demonstrate improved medication adherence as evidenced by verbalized understanding of prescribed medication regimen, assistance available, and patient report of adherence  Interventions:  Follow up with Kendra [name] regarding pill packaging from [employer] and medication adherence ? Denies any missed doses from pill packaging; reports receiving new pill packs this week ? Remind Kendra [name] to use maintenance inhaler (Trelegy) daily (and rinse out mouth)  Counsel on importance of blood sugar control and monitoring ? Review recent blood sugar results (see below) ? Denies any low blood sugars ? Reports taking: ? Lantus 10 units QHS ? Metformin ER 500 mg BID from pill pack ? Counsel on blood sugar monitoring technique, including consistently checking post-meal levels ~2 hours after meal ? Encourage patient to continue to eat regular meals, including breakfast, but counsel on controlling carbohydrate portion size  Counsel on importance of continued blood pressure monitoring and control. ? Reports taking lisinopril from pill pack daily ? Review recent blood pressure results (see below)  Follow up with patient regarding smoking cessation. Kendra [name] reports that she has reduced her smoking and has decided not to buy another pack ? Reports now using nicotine patches, obtained through [facility] over the counter (OTC) benefit and has started using these as directed. ? Discuss current triggers for cravings and strategies from managing these ? Going outside when people smoking inside her home ? Encouraging patient to share with her family her intention to quit and requesting their support, including by not smoking around her ? Discuss benefits of smoking cessation including cardiovascular heatlh, lung health and cost savings  Discussed plans with patient for ongoing care management follow up and provided patient with direct contact information for care management team  Patient Self  Care Activities:  . Currently UNABLE to consistently manage medications. Receives some assistance from sons, Kendra [name] and Kendra Glaser, with managing medications o Using pill packaging from [employer] [facility] o Alarm on [name] set for 10 pm as reminder for insulin adminisration . Calls pharmacy for medication refills . Patient to attend scheduled medical appointments . Calls provider office for new concerns or questions  . Patient to check blood sugars twice daily and keep log Date Fasting Blood Glucose After Breakfast Notes  10 - October 116    11 - October 111    12 - October 89 151   13 - October 141 120   14 - October 112 240* *Had large portions of carbohydrates  15 - October 101 -   Average 112     . Patient to check blood pressure daily and keep log Date Blood Pressure  10 - October 114/73, HR 70  11 - October 109/77, HR 73  12 - October 120/82, HR 69  13 - October 133/85, HR 71  14 - October 119/78, HR 74  15 - October  156/86, HR 76 -  Blood pressure goal: < 130/80   Please see past updates related to this goal by clicking on the "Past Updates" button in the selected goal          Plan  Telephone follow up appointment with care management team member scheduled for: 10/23 at 1:30pm  [name], PharmD, [place] [phone]

## 2019-07-10 ENCOUNTER — Ambulatory Visit: Payer: Self-pay | Admitting: *Deleted

## 2019-07-10 DIAGNOSIS — I10 Essential (primary) hypertension: Secondary | ICD-10-CM

## 2019-07-10 NOTE — Patient Instructions (Signed)
Thank you allowing the Chronic Care Management Team to be a part of your care! It was a pleasure speaking with you today!   CCM (Chronic Care Management) Team   Aurelie Dicenzo RN, BSN Nurse Care Coordinator  [phone]  [name] PharmD  Clinical Pharmacist  [phone]  [name] LCSW Clinical Social Worker [phone]  Goals Addressed            This Visit's Progress   . RNCM-My headaches are worse (pt-stated)       Current Barriers:  [name] Knowledge Deficits related to basic understanding of hypertension pathophysiology and self care management . Knowledge Deficits related to understanding of medications prescribed for management of hypertension . Non-adherence to prescribed medication regimen . Film/video editor.   Case Manager Clinical Goal(s):  [name] Over the next 30 days, patient will attend all scheduled medical appointments: PCP 08/20/19 [facility] 07/11/19 &10am General Neurology Oct 12 2pm with Juliann Pulse Patel(Video visit)  . Over the next 90 days, patient will demonstrate improved adherence to prescribed treatment plan for hypertension as evidenced by taking all medications as prescribed, monitoring and recording blood pressure as directed, adhering to low sodium/DASH diet  Interventions:  Discussed plans with patient for ongoing care management follow up and provided patient with direct contact information for care management team PCP 08/20/19 [facility] 07/11/19 @ 10am General Neurology Oct 12 2pm with Juliann Pulse Patel(Video visit)  . Called to remind patient of Eye appt tomorrow, Patient stated she has a ride.  . No documentation of patient talking with neurology- will have to f/u  Patient Self Care Activities:  . UNABLE to independently manage worsening headaches . Checks BP and records as discussed  Please see past updates related to this goal by clicking on the "Past Updates" button in the selected goal          The patient verbalized  understanding of instructions provided today and declined a print copy of patient instruction materials.   The patient has been provided with contact information for the care management team and has been advised to call with any health related questions or concerns.

## 2019-07-10 NOTE — Chronic Care Management (AMB) (Signed)
Chronic Care Management   Follow Up Note   07/10/2019 Name: Kendra [name] MRN: [id number] DOB: 06-04-1946  Referred by: [name], NP Reason for referral : Chronic Care Management (Appt reminder)   Kendra [name] is a 73 y.o. year old female who is a primary care patient of [name], NP. The CCM team was consulted for assistance with chronic disease management and care coordination needs.    Review of patient status, including review of consultants reports, relevant laboratory and other test results, and collaboration with appropriate care team members and the patient's provider was performed as part of comprehensive patient evaluation and provision of chronic care management services.    SDOH (Social Determinants of Health) screening performed today: Transportation. See Care Plan for related entries.   Advanced Directives Status: N See Care Plan and Vynca application for related entries.  Outpatient Encounter Medications as of 07/10/2019  Medication Sig Note  . acetaminophen (TYLENOL) 500 MG tablet Take 2 tablets (1,000 mg total) by mouth every 8 (eight) hours as needed for mild pain or moderate pain.   [name] albuterol (VENTOLIN HFA) 108 (90 Base) MCG/ACT inhaler INHALE 1 TO 2 PUFFS INTO THE LUNGS EVERY 6 HOURS AS NEEDED FOR WHEEZING OR SHORTNESS OF BREATH   . aspirin 81 MG EC tablet Take 1 tablet (81 mg total) by mouth daily. Swallow whole.   [name] atorvastatin (LIPITOR) 40 MG tablet Take 1 tablet (40 mg total) by mouth daily with supper.   . baclofen (LIORESAL) 10 MG tablet TAKE 1 TABLET BY MOUTH ONCE DAILY AS NEEDED MUSCLE SPASMS   . bisacodyl (DULCOLAX) 5 MG EC tablet Take 5 mg by mouth daily as needed for moderate constipation. 01/30/2019: Reports using 1 tablet (5 mg)/day on 2 days in past week  . Blood Glucose Monitoring Suppl (ONETOUCH VERIO) w/Device KIT 1 kit by Does not apply route 2 (two) times daily.   [name] buPROPion (WELLBUTRIN XL) 300 MG 24 hr tablet  Take 1 tablet (300 mg total) by mouth daily. 06/03/2019: Currently still taking bupropion XL 150 mg once daily  . Cholecalciferol (VITAMIN D3) 50 MCG (2000 UT) capsule Take 1 capsule (2,000 Units total) by mouth daily.   . citalopram (CELEXA) 40 MG tablet TAKE 1 TABLET BY MOUTH EVERY DAY   . Fluticasone-Umeclidin-Vilant (TRELEGY ELLIPTA) 100-62.5-25 MCG/INH AEPB Inhale 1 puff into the lungs daily.   [name] glucose blood (ONETOUCH VERIO) test strip Use as instructed   . Insulin Glargine (LANTUS SOLOSTAR) 100 UNIT/ML Solostar Pen INJECT  12 UNITS SUBCUTANEOUSLY EVERY DAY  AT  10PM (Patient taking differently: INJECT  10 UNITS SUBCUTANEOUSLY EVERY DAY  AT  10PM)   . Insulin Pen Needle (BD PEN NEEDLE NANO U/F) 32G X 4 MM MISC USE WITH LANTUS AS DIRECTED   . Lancet Devices (ONE TOUCH DELICA LANCING DEV) MISC 1 Device by Does not apply route 2 (two) times daily.   [name] lisinopril (ZESTRIL) 5 MG tablet Take 1 tablet (5 mg total) by mouth daily. 06/03/2019: Currently still taking lisinopril 2.5 mg daily  . meloxicam (MOBIC) 15 MG tablet TAKE 1 TABLET (15 MG TOTAL) BY MOUTH DAILY AS NEEDED (FOR ARTHRITIS PAIN).   [name] metFORMIN (GLUCOPHAGE-XR) 500 MG 24 hr tablet Take 1 tablet (500 mg total) by mouth 2 (two) times daily with a meal.   . nicotine (NICODERM CQ - DOSED IN MG/24 HOURS) 14 mg/24hr patch Place onto the skin.   [name] Lancets 70Y MISC 1 Device  by Does not apply route 2 (two) times daily.   . polyethylene glycol powder (GLYCOLAX/MIRALAX) 17 GM/SCOOP powder Take 17-34 g by mouth daily as needed for mild constipation or moderate constipation.    No facility-administered encounter medications on file as of 07/10/2019.      Goals Addressed            This Visit's Progress   . RNCM-My headaches are worse (pt-stated)       Current Barriers:  [name] Knowledge Deficits related to basic understanding of hypertension pathophysiology and self care management . Knowledge Deficits related to understanding of  medications prescribed for management of hypertension . Non-adherence to prescribed medication regimen . Film/video editor.   Case Manager Clinical Goal(s):  [name] Over the next 30 days, patient will attend all scheduled medical appointments: PCP 08/20/19 [facility] 07/11/19 &10am General Neurology Oct 12 2pm with Juliann Pulse Patel(Video visit)  . Over the next 90 days, patient will demonstrate improved adherence to prescribed treatment plan for hypertension as evidenced by taking all medications as prescribed, monitoring and recording blood pressure as directed, adhering to low sodium/DASH diet  Interventions:  Discussed plans with patient for ongoing care management follow up and provided patient with direct contact information for care management team PCP 08/20/19 [facility] 07/11/19 @ 10am General Neurology Oct 12 2pm with Juliann Pulse Patel(Video visit)  . Called to remind patient of Eye appt tomorrow, Patient stated she has a ride.  . No documentation of patient talking with neurology- will have to f/u  Patient Self Care Activities:  . UNABLE to independently manage worsening headaches . Checks BP and records as discussed  Please see past updates related to this goal by clicking on the "Past Updates" button in the selected goal           The patient has been provided with contact information for the care management team and has been advised to call with any health related questions or concerns.   Merlene Morse Frady Taddeo RN, BSN Nurse Case Pharmacist, [facility]  [phone]) [employer]

## 2019-07-11 ENCOUNTER — Ambulatory Visit: Payer: Medicare Other | Admitting: Pharmacist

## 2019-07-11 DIAGNOSIS — Z794 Long term (current) use of insulin: Secondary | ICD-10-CM | POA: Diagnosis not present

## 2019-07-11 DIAGNOSIS — E118 Type 2 diabetes mellitus with unspecified complications: Secondary | ICD-10-CM

## 2019-07-11 NOTE — Patient Instructions (Signed)
Thank you allowing the Chronic Care Management Team to be a part of your care! It was a pleasure speaking with you today!     CCM (Chronic Care Management) Team    [name] RN, BSN Nurse Care Coordinator  [phone]   [name] PharmD  Clinical Pharmacist  [phone]   [name] LCSW Clinical Social Worker [phone]  Visit Information  Goals Addressed            This Visit's Progress   . PharmD-Medication Adherence       Current Barriers:  . Financial Barriers . Knowledge deficits related to coordination of her own care . Limited social support  Pharmacist Clinical Goal(s):  [name] Over the next 30 days, patient will demonstrate improved medication adherence as evidenced by verbalized understanding of prescribed medication regimen, assistance available, and patient report of adherence  Interventions:  Coordination of care - Ms. Bahri reports that she missed eye exam appointment today as her transportation fell through and unable to arrange transportation through health plan as appointment was too soon. ? Encourage patient to reschedule appointment now and then to call health plan benefit transportation to setup ride to ensure reliable transportation ? Provide patient with phone number and address to [facility]  Follow up call to Ms. Bennette. Patient reports that she rescheduled Annual Eye Exam for 11/4 at 10:30 am ? Advise patient to call health benefit to arrange transportation to this appointment. Confirms having phone number and states will call now  Denies any current medication-related questions/concerns   Patient Self Care Activities:  . Currently UNABLE to consistently manage medications. Receives some assistance from sons, [place] and [name], with managing medications o Using pill packaging from [employer] [facility] o Alarm on phone set for 10 pm as reminder for insulin adminisration . Calls pharmacy for medication refills . Patient to  attend scheduled medical appointments . Calls provider office for new concerns or questions  . Patient to check blood sugars twice daily and keep log . Patient to check blood pressure daily and keep log - Blood pressure goal: < 130/80   Please see past updates related to this goal by clicking on the "Past Updates" button in the selected goal          The patient verbalized understanding of instructions provided today and declined a print copy of patient instruction materials.   Telephone follow up appointment with care management team member scheduled for: 11/4 at 2:30 pm   [name], PharmD, [place] [phone]

## 2019-07-11 NOTE — Chronic Care Management (AMB) (Signed)
Chronic Care Management   Follow Up Note   07/11/2019 Name: Kendra [name] MRN: [id number] DOB: [birth year]  Referred by: Kendra College, NP Reason for referral : Chronic Care Management (Patient Phone Call)   Kendra [name] is a 73 y.o. year old female who is a primary care patient of Kendra College, NP. The CCM team was consulted for assistance with chronic disease management and care coordination needs.  Kendra [name] has a medical history which includes but is not limited to recent CVA (11/2018), Type 2 Diabetes, hypertension, hyperlipidemia and COPD.  I reached out to [name] by phone today.   Review of patient status, including review of consultants reports, relevant laboratory and other test results, and collaboration with appropriate care team members and the patient's provider was performed as part of comprehensive patient evaluation and provision of chronic care management services.      Outpatient Encounter Medications as of 07/11/2019  Medication Sig Note  . acetaminophen (TYLENOL) 500 MG tablet Take 2 tablets (1,000 mg total) by mouth every 8 (eight) hours as needed for mild pain or moderate pain.   [name] albuterol (VENTOLIN HFA) 108 (90 Base) MCG/ACT inhaler INHALE 1 TO 2 PUFFS INTO THE LUNGS EVERY 6 HOURS AS NEEDED FOR WHEEZING OR SHORTNESS OF BREATH   . aspirin 81 MG EC tablet Take 1 tablet (81 mg total) by mouth daily. Swallow whole.   [name] atorvastatin (LIPITOR) 40 MG tablet Take 1 tablet (40 mg total) by mouth daily with supper.   . baclofen (LIORESAL) 10 MG tablet TAKE 1 TABLET BY MOUTH ONCE DAILY AS NEEDED MUSCLE SPASMS   . bisacodyl (DULCOLAX) 5 MG EC tablet Take 5 mg by mouth daily as needed for moderate constipation. 01/30/2019: Reports using 1 tablet (5 mg)/day on 2 days in past week  . Blood Glucose Monitoring Suppl (ONETOUCH VERIO) w/Device KIT 1 kit by Does not apply route 2 (two) times daily.   [name] buPROPion (WELLBUTRIN XL) 300 MG 24 hr tablet  Take 1 tablet (300 mg total) by mouth daily. 06/03/2019: Currently still taking bupropion XL 150 mg once daily  . Cholecalciferol (VITAMIN D3) 50 MCG (2000 UT) capsule Take 1 capsule (2,000 Units total) by mouth daily.   . citalopram (CELEXA) 40 MG tablet TAKE 1 TABLET BY MOUTH EVERY DAY   . Fluticasone-Umeclidin-Vilant (TRELEGY ELLIPTA) 100-62.5-25 MCG/INH AEPB Inhale 1 puff into the lungs daily.   [name] glucose blood (ONETOUCH VERIO) test strip Use as instructed   . Insulin Glargine (LANTUS SOLOSTAR) 100 UNIT/ML Solostar Pen INJECT  12 UNITS SUBCUTANEOUSLY EVERY DAY  AT  10PM (Patient taking differently: INJECT  10 UNITS SUBCUTANEOUSLY EVERY DAY  AT  10PM)   . Insulin Pen Needle (BD PEN NEEDLE NANO U/F) 32G X 4 MM MISC USE WITH LANTUS AS DIRECTED   . Lancet Devices (ONE TOUCH DELICA LANCING DEV) MISC 1 Device by Does not apply route 2 (two) times daily.   [name] lisinopril (ZESTRIL) 5 MG tablet Take 1 tablet (5 mg total) by mouth daily. 06/03/2019: Currently still taking lisinopril 2.5 mg daily  . meloxicam (MOBIC) 15 MG tablet TAKE 1 TABLET (15 MG TOTAL) BY MOUTH DAILY AS NEEDED (FOR ARTHRITIS PAIN).   [name] metFORMIN (GLUCOPHAGE-XR) 500 MG 24 hr tablet Take 1 tablet (500 mg total) by mouth 2 (two) times daily with a meal.   . nicotine (NICODERM CQ - DOSED IN MG/24 HOURS) 14 mg/24hr patch Place onto the skin.   Kendra [name]  Delica Lancets 32Y MISC 1 Device by Does not apply route 2 (two) times daily.   . polyethylene glycol powder (GLYCOLAX/MIRALAX) 17 GM/SCOOP powder Take 17-34 g by mouth daily as needed for mild constipation or moderate constipation.    No facility-administered encounter medications on file as of 07/11/2019.     Goals Addressed            This Visit's Progress   . PharmD-Medication Adherence       Current Barriers:  . Financial Barriers . Knowledge deficits related to coordination of her own care . Limited social support  Pharmacist Clinical Goal(s):  [name] Over the next 30 days,  patient will demonstrate improved medication adherence as evidenced by verbalized understanding of prescribed medication regimen, assistance available, and patient report of adherence  Interventions:  Coordination of care - [name] reports that she missed eye exam appointment today as her transportation fell through and unable to arrange transportation through health plan as appointment was too soon. ? Encourage patient to reschedule appointment now and then to call health plan benefit transportation to setup ride to ensure reliable transportation ? Provide patient with phone number and address to [name]  Follow up call to Kendra [name]. Patient reports that she rescheduled Annual Eye Exam for 11/4 at 10:30 am ? Advise patient to call health benefit to arrange transportation to this appointment. Confirms having phone number and states will call now  Denies any current medication-related questions/concerns   Patient Self Care Activities:  . Currently UNABLE to consistently manage medications. Receives some assistance from sons, Kendra [name] and Kendra [name], with managing medications o Using pill packaging from [employer] [facility] o Alarm on phone set for 10 pm as reminder for insulin adminisration . Calls pharmacy for medication refills . Patient to attend scheduled medical appointments . Calls provider office for new concerns or questions  . Patient to check blood sugars twice daily and keep log . Patient to check blood pressure daily and keep log - Blood pressure goal: < 130/80   Please see past updates related to this goal by clicking on the "Past Updates" button in the selected goal          Plan  Telephone follow up appointment with care management team member scheduled for: 11/4 at 2:30 pm   Kendra [name], PharmD, [place] [phone]

## 2019-07-18 NOTE — Progress Notes (Signed)
I have reviewed this encounter including the documentation in this note and/or discussed this patient with the provider. I am certifying that I agree with the content of this note as supervising physician.  Nobie Putnam, DO [facility] 07/18/2019, 11:08 PM

## 2019-07-22 ENCOUNTER — Other Ambulatory Visit: Payer: Self-pay | Admitting: Family Medicine

## 2019-07-22 DIAGNOSIS — E1165 Type 2 diabetes mellitus with hyperglycemia: Secondary | ICD-10-CM

## 2019-07-22 DIAGNOSIS — IMO0002 Reserved for concepts with insufficient information to code with codable children: Secondary | ICD-10-CM

## 2019-07-23 ENCOUNTER — Ambulatory Visit: Payer: Medicare Other | Admitting: Pharmacist

## 2019-07-23 DIAGNOSIS — Z794 Long term (current) use of insulin: Secondary | ICD-10-CM

## 2019-07-23 DIAGNOSIS — E118 Type 2 diabetes mellitus with unspecified complications: Secondary | ICD-10-CM

## 2019-07-23 DIAGNOSIS — F17200 Nicotine dependence, unspecified, uncomplicated: Secondary | ICD-10-CM

## 2019-07-23 DIAGNOSIS — I10 Essential (primary) hypertension: Secondary | ICD-10-CM

## 2019-07-23 NOTE — Patient Instructions (Signed)
Thank you allowing the Chronic Care Management Team to be a part of your care! It was a pleasure speaking with you today!     CCM (Chronic Care Management) Team    [name] RN, BSN Nurse Care Coordinator  [phone]   [name] PharmD  Clinical Pharmacist  [phone]   [name] LCSW Clinical Social Worker [phone]  Visit Information  Goals Addressed            This Visit's Progress   . PharmD-Medication Adherence       Current Barriers:  . Financial Barriers . Knowledge deficits related to coordination of her own care . Limited social support  Pharmacist Clinical Goal(s):  [name] Over the next 30 days, patient will demonstrate improved medication adherence as evidenced by verbalized understanding of prescribed medication regimen, assistance available, and patient report of adherence  Interventions:  Coordination of care - Ms. Scites reports that she missed eye exam appointment again today as she did not call to arrange transportation ? Patient reports that she has recently had trouble with her phone ? States that she would prefer to go to [name].   Remind patient of information as provided by CM Nurse Case Manager. Per note: "[place] center who stated co-pay would be 75$, patient stating this is too much. [place] 30-45$(possibly 0$ co-pay based on what is found medically)"  Counsel on importance of eye exam  Ms. Chamber to call to reschedule eye exam and call for transportation. Provide patient with contact information.  Follow up with Ms. Riding regarding pill packaging from [employer] and medication adherence ? Denies any missed doses from pill packaging ? Reports using maintenance inhaler (Trelegy) daily (and rinse out mouth)  Counsel on importance of blood sugar control and monitoring ? Review recent blood sugar results (see below)  Denies any low blood sugars ? Reports  taking:  Lantus 10 units QHS  Metformin ER 500 mg BID from pill pack ? Counsel on blood sugar monitoring technique, including consistently checking post-meal levels ~2 hours after meal ? Encourage patient to continue to eat regular meals, including breakfast, but counsel on controlling carbohydrate portion size  Counsel on importance of continued blood pressure monitoring and control. ? Reports taking lisinopril from pill pack daily ? Review recent blood pressure results (see below)  Follow up with patient regarding smoking cessation. Ms. Mila reports that she has reduced her smoking to ~2 cigarettes/day  ? Reports now using nicotine patches, obtained through [facility] over the counter (OTC) benefit and has started using these as directed. ? Reports family has been supportive of her intention to quit ? Discuss benefits of smoking cessation including cardiovascular heatlh, lung health and cost savings  Patient Self Care Activities:  . Currently UNABLE to consistently manage medications. Receives some assistance from sons, [place] and [name], with managing medications o Using pill packaging from [employer] [facility] o Alarm on phone set for 10 pm as reminder for insulin adminisration . Calls pharmacy for medication refills . Patient to attend scheduled medical appointments . Calls provider office for new concerns or questions  . Patient to check blood sugars twice daily and keep log Date Fasting Blood Glucose After Breakfast After lunch  29 - October     30 - October 90 125   31 - October 100 102   1 - November 133 104 258  2 - November - -   3 - November 111  118   4 - November 105     . Patient to check blood pressure daily and keep log Date Blood Pressure  29 - October 128/87, HR 75  30 - October -  31 - October -  1 - November 123/86, HR 56  2 - November 143/82, HR 76  3 - November 138/95, HR 73  4 - November 119/103, HR 71 -  Blood pressure goal: < 130/80   Please see past updates  related to this goal by clicking on the "Past Updates" button in the selected goal          The patient verbalized understanding of instructions provided today and declined a print copy of patient instruction materials.   Telephone follow up appointment with care management team member scheduled for: 11/6 at 2:30pm  [name], PharmD, [place] [phone]

## 2019-07-23 NOTE — Chronic Care Management (AMB) (Signed)
Chronic Care Management   Follow Up Note   07/23/2019 Name: Kendra [name] MRN: [id number] DOB: 04/24/1946  Referred by: [name], NP Reason for referral : Chronic Care Management (Patient Phone Call)   MYLES MALLICOAT is a 73 y.o. year old female who is a primary care patient of [name], NP. The CCM team was consulted for assistance with chronic disease management and care coordination needs.  Ms. Gunnoe has a medical history which includes but is not limited to recent CVA (11/2018), Type 2 Diabetes, hypertension, hyperlipidemia and COPD.  I reached out to [name] by phone today.   Review of patient status, including review of consultants reports, relevant laboratory and other test results, and collaboration with appropriate care team members and the patient's provider was performed as part of comprehensive patient evaluation and provision of chronic care management services.     Outpatient Encounter Medications as of 07/23/2019  Medication Sig Note  . Fluticasone-Umeclidin-Vilant (TRELEGY ELLIPTA) 100-62.5-25 MCG/INH AEPB Inhale 1 puff into the lungs daily.   . Insulin Glargine (LANTUS SOLOSTAR) 100 UNIT/ML Solostar Pen INJECT  12 UNITS SUBCUTANEOUSLY EVERY DAY  AT  10PM (Patient taking differently: INJECT  10 UNITS SUBCUTANEOUSLY EVERY DAY  AT  10PM)   . lisinopril (ZESTRIL) 5 MG tablet Take 1 tablet (5 mg total) by mouth daily.   . metFORMIN (GLUCOPHAGE-XR) 500 MG 24 hr tablet TAKE 1 TABLET (500 MG TOTAL) BY MOUTH 2 (TWO) TIMES DAILY WITH A MEAL.   [name] acetaminophen (TYLENOL) 500 MG tablet Take 2 tablets (1,000 mg total) by mouth every 8 (eight) hours as needed for mild pain or moderate pain.   [name] albuterol (VENTOLIN HFA) 108 (90 Base) MCG/ACT inhaler INHALE 1 TO 2 PUFFS INTO THE LUNGS EVERY 6 HOURS AS NEEDED FOR WHEEZING OR SHORTNESS OF BREATH   . aspirin 81 MG EC tablet Take 1 tablet (81 mg total) by mouth daily. Swallow whole.   [name] atorvastatin  (LIPITOR) 40 MG tablet Take 1 tablet (40 mg total) by mouth daily with supper.   . baclofen (LIORESAL) 10 MG tablet TAKE 1 TABLET BY MOUTH ONCE DAILY AS NEEDED MUSCLE SPASMS   . bisacodyl (DULCOLAX) 5 MG EC tablet Take 5 mg by mouth daily as needed for moderate constipation. 01/30/2019: Reports using 1 tablet (5 mg)/day on 2 days in past week  . Blood Glucose Monitoring Suppl (ONETOUCH VERIO) w/Device KIT 1 kit by Does not apply route 2 (two) times daily.   [name] buPROPion (WELLBUTRIN XL) 300 MG 24 hr tablet Take 1 tablet (300 mg total) by mouth daily. 06/03/2019: Currently still taking bupropion XL 150 mg once daily  . Cholecalciferol (VITAMIN D3) 50 MCG (2000 UT) capsule Take 1 capsule (2,000 Units total) by mouth daily.   . citalopram (CELEXA) 40 MG tablet TAKE 1 TABLET BY MOUTH EVERY DAY   . glucose blood (ONETOUCH VERIO) test strip Use as instructed   . Insulin Pen Needle (BD PEN NEEDLE NANO U/F) 32G X 4 MM MISC USE WITH LANTUS AS DIRECTED   . Lancet Devices (ONE TOUCH DELICA LANCING DEV) MISC 1 Device by Does not apply route 2 (two) times daily.   . meloxicam (MOBIC) 15 MG tablet TAKE 1 TABLET (15 MG TOTAL) BY MOUTH DAILY AS NEEDED (FOR ARTHRITIS PAIN).   [name] nicotine (NICODERM CQ - DOSED IN MG/24 HOURS) 14 mg/24hr patch Place onto the skin.   [name] Lancets 37S MISC 1 Device by Does  not apply route 2 (two) times daily.   . polyethylene glycol powder (GLYCOLAX/MIRALAX) 17 GM/SCOOP powder Take 17-34 g by mouth daily as needed for mild constipation or moderate constipation.    No facility-administered encounter medications on file as of 07/23/2019.     Goals Addressed            This Visit's Progress   . PharmD-Medication Adherence       Current Barriers:  . Financial Barriers . Knowledge deficits related to coordination of her own care . Limited social support  Pharmacist Clinical Goal(s):  [name] Over the next 30 days, patient will demonstrate improved medication adherence as  evidenced by verbalized understanding of prescribed medication regimen, assistance available, and patient report of adherence  Interventions:  Coordination of care - Ms. Lisby reports that she missed eye exam appointment again today as she did not call to arrange transportation ? Patient reports that she has recently had trouble with her phone ? States that she would prefer to go to [facility].   Remind patient of information as provided by CM Nurse Case Manager. Per note: "[place] center who stated co-pay would be 75$, patient stating this is too much. [place] 30-45$(possibly 0$ co-pay based on what is found medically)"  Counsel on importance of eye exam  Ms. Chamber to call to reschedule eye exam and call for transportation. Provide patient with contact information.  Follow up with Ms. Brent regarding pill packaging from [employer] and medication adherence ? Denies any missed doses from pill packaging ? Reports using maintenance inhaler (Trelegy) daily (and rinse out mouth)  Counsel on importance of blood sugar control and monitoring ? Review recent blood sugar results (see below)  Denies any low blood sugars ? Reports taking:  Lantus 10 units QHS  Metformin ER 500 mg BID from pill pack ? Counsel on blood sugar monitoring technique, including consistently checking post-meal levels ~2 hours after meal ? Encourage patient to continue to eat regular meals, including breakfast, but counsel on controlling carbohydrate portion size  Counsel on importance of continued blood pressure monitoring and control. ? Reports taking lisinopril from pill pack daily ? Review recent blood pressure results (see below)  Follow up with patient regarding smoking cessation. Ms. Machnik reports that she has reduced her smoking to ~2 cigarettes/day  ? Reports now using nicotine patches, obtained through [facility] over the counter (OTC) benefit and has started  using these as directed. ? Reports family has been supportive of her intention to quit ? Discuss benefits of smoking cessation including cardiovascular heatlh, lung health and cost savings  Patient Self Care Activities:  . Currently UNABLE to consistently manage medications. Receives some assistance from sons, [place] and [name], with managing medications o Using pill packaging from [employer] [facility] o Alarm on phone set for 10 pm as reminder for insulin adminisration . Calls pharmacy for medication refills . Patient to attend scheduled medical appointments . Calls provider office for new concerns or questions  . Patient to check blood sugars twice daily and keep log Date Fasting Blood Glucose After Breakfast After lunch  29 - October     30 - October 90 125   31 - October 100 102   1 - November 133 104 258  2 - November - -   3 - November 111 118   4 - November 105     . Patient to check blood pressure daily and keep log  Date Blood Pressure  29 - October 128/87, HR 75  30 - October -  31 - October -  1 - November 123/86, HR 56  2 - November 143/82, HR 76  3 - November 138/95, HR 73  4 - November 119/103, HR 71 -  Blood pressure goal: < 130/80   Please see past updates related to this goal by clicking on the "Past Updates" button in the selected goal          Plan  Telephone follow up appointment with care management team member scheduled for: 11/6 at 2:30 pm to follow up with patient to confirm both eye exam and transportation scheduled  [name], PharmD, [place] [phone]

## 2019-07-25 ENCOUNTER — Ambulatory Visit: Payer: Medicare Other | Admitting: Pharmacist

## 2019-07-25 DIAGNOSIS — Z794 Long term (current) use of insulin: Secondary | ICD-10-CM

## 2019-07-25 DIAGNOSIS — E118 Type 2 diabetes mellitus with unspecified complications: Secondary | ICD-10-CM

## 2019-07-25 NOTE — Chronic Care Management (AMB) (Signed)
Chronic Care Management   Follow Up Note   07/25/2019 Name: Kendra [name] MRN: [id number] DOB: 12-11-1945  Referred by: Kendra College, NP (Inactive) Reason for referral : Chronic Care Management (Patient Phone Call)   Kendra [name] is a 73 y.o. year old female who is a primary care patient of Kendra College, NP (Inactive). The CCM team was consulted for assistance with chronic disease management and care coordination needs.  Kendra [name] has a medical history which includes but is not limited to recent CVA (11/2018), Type 2 Diabetes, hypertension, hyperlipidemia and COPD.  I reached out to Kendra [name] by phone today.   Review of patient status, including review of consultants reports, relevant laboratory and other test results, and collaboration with appropriate care team members and the patient's provider was performed as part of comprehensive patient evaluation and provision of chronic care management services.     Outpatient Encounter Medications as of 07/25/2019  Medication Sig Note  . acetaminophen (TYLENOL) 500 MG tablet Take 2 tablets (1,000 mg total) by mouth every 8 (eight) hours as needed for mild pain or moderate pain.   [name] albuterol (VENTOLIN HFA) 108 (90 Base) MCG/ACT inhaler INHALE 1 TO 2 PUFFS INTO THE LUNGS EVERY 6 HOURS AS NEEDED FOR WHEEZING OR SHORTNESS OF BREATH   . aspirin 81 MG EC tablet Take 1 tablet (81 mg total) by mouth daily. Swallow whole.   [name] atorvastatin (LIPITOR) 40 MG tablet Take 1 tablet (40 mg total) by mouth daily with supper.   . baclofen (LIORESAL) 10 MG tablet TAKE 1 TABLET BY MOUTH ONCE DAILY AS NEEDED MUSCLE SPASMS   . bisacodyl (DULCOLAX) 5 MG EC tablet Take 5 mg by mouth daily as needed for moderate constipation. 01/30/2019: Reports using 1 tablet (5 mg)/day on 2 days in past week  . Blood Glucose Monitoring Suppl (ONETOUCH VERIO) w/Device KIT 1 kit by Does not apply route 2 (two) times daily.   [name] buPROPion (WELLBUTRIN XL)  300 MG 24 hr tablet Take 1 tablet (300 mg total) by mouth daily. 06/03/2019: Currently still taking bupropion XL 150 mg once daily  . Cholecalciferol (VITAMIN D3) 50 MCG (2000 UT) capsule Take 1 capsule (2,000 Units total) by mouth daily.   . citalopram (CELEXA) 40 MG tablet TAKE 1 TABLET BY MOUTH EVERY DAY   . Fluticasone-Umeclidin-Vilant (TRELEGY ELLIPTA) 100-62.5-25 MCG/INH AEPB Inhale 1 puff into the lungs daily.   [name] glucose blood (ONETOUCH VERIO) test strip Use as instructed   . Insulin Glargine (LANTUS SOLOSTAR) 100 UNIT/ML Solostar Pen INJECT  12 UNITS SUBCUTANEOUSLY EVERY DAY  AT  10PM (Patient taking differently: INJECT  10 UNITS SUBCUTANEOUSLY EVERY DAY  AT  10PM)   . Insulin Pen Needle (BD PEN NEEDLE NANO U/F) 32G X 4 MM MISC USE WITH LANTUS AS DIRECTED   . Lancet Devices (ONE TOUCH DELICA LANCING DEV) MISC 1 Device by Does not apply route 2 (two) times daily.   [name] lisinopril (ZESTRIL) 5 MG tablet Take 1 tablet (5 mg total) by mouth daily.   . meloxicam (MOBIC) 15 MG tablet TAKE 1 TABLET (15 MG TOTAL) BY MOUTH DAILY AS NEEDED (FOR ARTHRITIS PAIN).   [name] metFORMIN (GLUCOPHAGE-XR) 500 MG 24 hr tablet TAKE 1 TABLET (500 MG TOTAL) BY MOUTH 2 (TWO) TIMES DAILY WITH A MEAL.   . nicotine (NICODERM CQ - DOSED IN MG/24 HOURS) 14 mg/24hr patch Place onto the skin.   [name] Lancets 85I MISC 1 Device  by Does not apply route 2 (two) times daily.   . polyethylene glycol powder (GLYCOLAX/MIRALAX) 17 GM/SCOOP powder Take 17-34 g by mouth daily as needed for mild constipation or moderate constipation.    No facility-administered encounter medications on file as of 07/25/2019.     Goals Addressed            This Visit's Progress   . PharmD-Medication Adherence       Current Barriers:  . Financial Barriers . Knowledge deficits related to coordination of her own care . Limited social support  Pharmacist Clinical Goal(s):  [name] Over the next 30 days, patient will demonstrate improved  medication adherence as evidenced by verbalized understanding of prescribed medication regimen, assistance available, and patient report of adherence  Interventions:  Coordination of care - Follow up with [name] regarding rescheduling of eye appointment and transportation for new appointment ? Reports has scheduled new eye exam appointment with [facility] ([place]) for 11/12 at 10 am ? Reports scheduled with this location over closer location as [place] center did not require a copayment prior to appointment. Verbalizes understanding that she may have copayment for appointment, as previously discussed with CCM Nurse Case Manager ? Encourage patient to call now to schedule transportation from health plan benefit.  Patient Self Care Activities:  . Currently UNABLE to consistently manage medications. Receives some assistance from sons, Kendra [name] and Kendra [name], with managing medications o Using pill packaging from [employer] [facility] o Alarm on phone set for 10 pm as reminder for insulin adminisration . Calls pharmacy for medication refills . Patient to attend scheduled medical appointments . Calls provider office for new concerns or questions  . Patient to check blood sugars twice daily and keep log . Patient to check blood pressure daily and keep log - Blood pressure goal: < 130/80   Please see past updates related to this goal by clicking on the "Past Updates" button in the selected goal           The care management team will reach out to the patient again over the next 3 days. to confirm transportation to eye appointment scheduled.  [name], PharmD, [place] [employer] [phone]

## 2019-07-25 NOTE — Patient Instructions (Signed)
Thank you allowing the Chronic Care Management Team to be a part of your care! It was a pleasure speaking with you today!     CCM (Chronic Care Management) Team    Kendra Minor RN, BSN Nurse Care Coordinator  [phone]   Kendra [name] PharmD  Clinical Pharmacist  [phone]   Kendra Fried LCSW Clinical Social Worker [phone]  Visit Information  Goals Addressed            This Visit's Progress   . PharmD-Medication Adherence       Current Barriers:  . Financial Barriers . Knowledge deficits related to coordination of her own care . Limited social support  Pharmacist Clinical Goal(s):  [name] Over the next 30 days, patient will demonstrate improved medication adherence as evidenced by verbalized understanding of prescribed medication regimen, assistance available, and patient report of adherence  Interventions:  Coordination of care - Follow up with Kendra [name] regarding rescheduling of eye appointment and transportation for new appointment ? Reports has scheduled new eye exam appointment with [facility] ([place]) for 11/12 at 10 am ? Reports scheduled with this location over closer location as [place] center did not require a copayment prior to appointment. Verbalizes understanding that she may have copayment for appointment, as previously discussed with CCM Nurse Case Manager ? Encourage patient to call now to schedule transportation from health plan benefit.  Patient Self Care Activities:  . Currently UNABLE to consistently manage medications. Receives some assistance from sons, Kendra [name] and Kendra [name], with managing medications o Using pill packaging from [employer] [facility] o Alarm on phone set for 10 pm as reminder for insulin adminisration . Calls pharmacy for medication refills . Patient to attend scheduled medical appointments . Calls provider office for new concerns or questions  . Patient to check blood sugars twice daily and keep log . Patient  to check blood pressure daily and keep log - Blood pressure goal: < 130/80   Please see past updates related to this goal by clicking on the "Past Updates" button in the selected goal          The patient verbalized understanding of instructions provided today and declined a print copy of patient instruction materials.   The care management team will reach out to the patient again over the next 3 days.   Kendra [name], PharmD, [place] [employer] [phone]

## 2019-07-28 ENCOUNTER — Telehealth: Payer: Self-pay

## 2019-07-28 ENCOUNTER — Ambulatory Visit: Payer: Self-pay | Admitting: Pharmacist

## 2019-07-28 DIAGNOSIS — Z794 Long term (current) use of insulin: Secondary | ICD-10-CM

## 2019-07-28 DIAGNOSIS — E118 Type 2 diabetes mellitus with unspecified complications: Secondary | ICD-10-CM

## 2019-07-28 NOTE — Chronic Care Management (AMB) (Signed)
Chronic Care Management   Follow Up Note   07/28/2019 Name: Kendra [name] MRN: [id number] DOB: 17-Jul-1946  Referred by: Kendra College, NP (Inactive) Reason for referral : Chronic Care Management (Patient Phone Call)   Kendra [name] is a 73 y.o. year old female who is a primary care patient of Kendra College, NP (Inactive). The CCM team was consulted for assistance with chronic disease management and care coordination needs.  Kendra [name] has a medical history which includes but is not limited to recent CVA (11/2018), Type 2 Diabetes, hypertension, hyperlipidemia and COPD.  I reached out to Kendra [name] by phone today for coordination of care  Review of patient status, including review of consultants reports, relevant laboratory and other test results, and collaboration with appropriate care team members and the patient's provider was performed as part of comprehensive patient evaluation and provision of chronic care management services.      Outpatient Encounter Medications as of 07/28/2019  Medication Sig Note  . acetaminophen (TYLENOL) 500 MG tablet Take 2 tablets (1,000 mg total) by mouth every 8 (eight) hours as needed for mild pain or moderate pain.   [name] albuterol (VENTOLIN HFA) 108 (90 Base) MCG/ACT inhaler INHALE 1 TO 2 PUFFS INTO THE LUNGS EVERY 6 HOURS AS NEEDED FOR WHEEZING OR SHORTNESS OF BREATH   . aspirin 81 MG EC tablet Take 1 tablet (81 mg total) by mouth daily. Swallow whole.   [name] atorvastatin (LIPITOR) 40 MG tablet Take 1 tablet (40 mg total) by mouth daily with supper.   . baclofen (LIORESAL) 10 MG tablet TAKE 1 TABLET BY MOUTH ONCE DAILY AS NEEDED MUSCLE SPASMS   . bisacodyl (DULCOLAX) 5 MG EC tablet Take 5 mg by mouth daily as needed for moderate constipation. 01/30/2019: Reports using 1 tablet (5 mg)/day on 2 days in past week  . Blood Glucose Monitoring Suppl (ONETOUCH VERIO) w/Device KIT 1 kit by Does not apply route 2 (two) times daily.   [name]  buPROPion (WELLBUTRIN XL) 300 MG 24 hr tablet Take 1 tablet (300 mg total) by mouth daily. 06/03/2019: Currently still taking bupropion XL 150 mg once daily  . Cholecalciferol (VITAMIN D3) 50 MCG (2000 UT) capsule Take 1 capsule (2,000 Units total) by mouth daily.   . citalopram (CELEXA) 40 MG tablet TAKE 1 TABLET BY MOUTH EVERY DAY   . Fluticasone-Umeclidin-Vilant (TRELEGY ELLIPTA) 100-62.5-25 MCG/INH AEPB Inhale 1 puff into the lungs daily.   [name] glucose blood (ONETOUCH VERIO) test strip Use as instructed   . Insulin Glargine (LANTUS SOLOSTAR) 100 UNIT/ML Solostar Pen INJECT  12 UNITS SUBCUTANEOUSLY EVERY DAY  AT  10PM (Patient taking differently: INJECT  10 UNITS SUBCUTANEOUSLY EVERY DAY  AT  10PM)   . Insulin Pen Needle (BD PEN NEEDLE NANO U/F) 32G X 4 MM MISC USE WITH LANTUS AS DIRECTED   . Lancet Devices (ONE TOUCH [name] LANCING DEV) MISC 1 Device by Does not apply route 2 (two) times daily.   [name] lisinopril (ZESTRIL) 5 MG tablet Take 1 tablet (5 mg total) by mouth daily.   . meloxicam (MOBIC) 15 MG tablet TAKE 1 TABLET (15 MG TOTAL) BY MOUTH DAILY AS NEEDED (FOR ARTHRITIS PAIN).   [name] metFORMIN (GLUCOPHAGE-XR) 500 MG 24 hr tablet TAKE 1 TABLET (500 MG TOTAL) BY MOUTH 2 (TWO) TIMES DAILY WITH A MEAL.   . nicotine (NICODERM CQ - DOSED IN MG/24 HOURS) 14 mg/24hr patch Place onto the skin.   Kendra [name] Lancets  33G MISC 1 Device by Does not apply route 2 (two) times daily.   . polyethylene glycol powder (GLYCOLAX/MIRALAX) 17 GM/SCOOP powder Take 17-34 g by mouth daily as needed for mild constipation or moderate constipation.    No facility-administered encounter medications on file as of 07/28/2019.     Goals Addressed            This Visit's Progress   . PharmD-Medication Adherence       Current Barriers:  . Financial Barriers . Knowledge deficits related to coordination of her own care . Limited social support  Pharmacist Clinical Goal(s):  [name] Over the next 30 days, patient will  demonstrate improved medication adherence as evidenced by verbalized understanding of prescribed medication regimen, assistance available, and patient report of adherence  Interventions:  Coordination of care - Follow up with Kendra [name] regarding rescheduling of transportation for upcoming eye appointment ? Note eye exam appointment with [facility] ([place]) scheduled for 11/12 at 10 am ? Kendra [name] reports scheduled transportation from health plan benefit for this appointment, tranportation to arrive 11/12 at 9:15 am.  Patient Self Care Activities:  . Currently UNABLE to consistently manage medications. Receives some assistance from sons, Kendra [name] and Kendra [name], with managing medications o Using pill packaging from [facility] o Alarm on phone set for 10 pm as reminder for insulin adminisration . Calls pharmacy for medication refills . Patient to attend scheduled medical appointments . Calls provider office for new concerns or questions  . Patient to check blood sugars twice daily and keep log . Patient to check blood pressure daily and keep log - Blood pressure goal: < 130/80   Please see past updates related to this goal by clicking on the "Past Updates" button in the selected goal          Plan  The care management team will reach out to the patient again over the next 14 days.   Kendra [name], PharmD, [employer] [phone]

## 2019-07-28 NOTE — Patient Instructions (Signed)
Thank you allowing the Chronic Care Management Team to be a part of your care! It was a pleasure speaking with you today!     CCM (Chronic Care Management) Team    [name] RN, BSN Nurse Care Coordinator  [phone]   [name] PharmD  Clinical Pharmacist  [phone]   [name] LCSW Clinical Social Worker [phone]  Visit Information  Goals Addressed            This Visit's Progress   . PharmD-Medication Adherence       Current Barriers:  . Financial Barriers . Knowledge deficits related to coordination of her own care . Limited social support  Pharmacist Clinical Goal(s):  [name] Over the next 30 days, patient will demonstrate improved medication adherence as evidenced by verbalized understanding of prescribed medication regimen, assistance available, and patient report of adherence  Interventions:  Coordination of care - Follow up with Ms. Bramel regarding rescheduling of transportation for upcoming eye appointment ? Note eye exam appointment with [facility] ([place]) scheduled for 11/12 at 10 am ? Ms. Hurrle reports scheduled transportation from health plan benefit for this appointment, tranportation to arrive 11/12 at 9:15 am.  Patient Self Care Activities:  . Currently UNABLE to consistently manage medications. Receives some assistance from sons, [place] and [name], with managing medications o Using pill packaging from [facility] o Alarm on phone set for 10 pm as reminder for insulin adminisration . Calls pharmacy for medication refills . Patient to attend scheduled medical appointments . Calls provider office for new concerns or questions  . Patient to check blood sugars twice daily and keep log . Patient to check blood pressure daily and keep log - Blood pressure goal: < 130/80   Please see past updates related to this goal by clicking on the "Past Updates" button in the selected goal          The patient  verbalized understanding of instructions provided today and declined a print copy of patient instruction materials.   The care management team will reach out to the patient again over the next 14 days.   [name], PharmD, [employer] [phone]

## 2019-08-04 ENCOUNTER — Other Ambulatory Visit: Payer: Self-pay | Admitting: Nurse Practitioner

## 2019-08-04 DIAGNOSIS — E785 Hyperlipidemia, unspecified: Secondary | ICD-10-CM

## 2019-08-04 DIAGNOSIS — IMO0002 Reserved for concepts with insufficient information to code with codable children: Secondary | ICD-10-CM

## 2019-08-04 DIAGNOSIS — E1165 Type 2 diabetes mellitus with hyperglycemia: Secondary | ICD-10-CM

## 2019-08-04 DIAGNOSIS — F3341 Major depressive disorder, recurrent, in partial remission: Secondary | ICD-10-CM

## 2019-08-04 DIAGNOSIS — M19011 Primary osteoarthritis, right shoulder: Secondary | ICD-10-CM

## 2019-08-11 ENCOUNTER — Ambulatory Visit: Payer: Self-pay | Admitting: Pharmacist

## 2019-08-11 DIAGNOSIS — Z794 Long term (current) use of insulin: Secondary | ICD-10-CM

## 2019-08-11 DIAGNOSIS — I10 Essential (primary) hypertension: Secondary | ICD-10-CM

## 2019-08-11 DIAGNOSIS — E118 Type 2 diabetes mellitus with unspecified complications: Secondary | ICD-10-CM

## 2019-08-11 DIAGNOSIS — F17200 Nicotine dependence, unspecified, uncomplicated: Secondary | ICD-10-CM

## 2019-08-11 DIAGNOSIS — J449 Chronic obstructive pulmonary disease, unspecified: Secondary | ICD-10-CM

## 2019-08-11 NOTE — Chronic Care Management (AMB) (Signed)
Chronic Care Management   Follow Up Note   08/11/2019 Name: Kendra [name] MRN: [id number] DOB: 11/19/1945   Referred by: Kendra College, NP (Inactive) Reason for referral : Chronic Care Management (Patient [name] Call)   Kendra [name] is a 73 y.o. year old female who is a primary care patient of Kendra College, NP (Inactive). The CCM team was consulted for assistance with chronic disease management and care coordination needs.  Kendra [name] has a medical history which includes but is not limited to recent CVA (11/2018), Type 2 Diabetes, hypertension, hyperlipidemia and COPD.  I reached out to Kendra [name].   Place coordination of care call to Kendra [name] of patient status, including [name] of consultants reports, relevant laboratory and other test results, and collaboration with appropriate care team members and the patient's provider was performed as part of comprehensive patient evaluation and provision of chronic care management services.     Outpatient Encounter Medications as of 08/11/2019  Medication Sig Note   albuterol (VENTOLIN HFA) 108 (90 Base) MCG/ACT inhaler INHALE 1 TO 2 PUFFS INTO THE LUNGS EVERY 6 HOURS AS NEEDED FOR WHEEZING OR SHORTNESS OF BREATH    aspirin 81 MG EC tablet Take 1 tablet (81 mg total) by mouth daily. Swallow whole.    atorvastatin (LIPITOR) 40 MG tablet TAKE 1 TABLET BY MOUTH ONCE DAILY    buPROPion (WELLBUTRIN XL) 150 MG 24 hr tablet TAKE 1 TABLET BY MOUTH ONCE DAILY    Fluticasone-Umeclidin-Vilant (TRELEGY ELLIPTA) 100-62.5-25 MCG/INH AEPB Inhale 1 puff into the lungs daily.    Insulin Glargine (LANTUS SOLOSTAR) 100 UNIT/ML Solostar Pen INJECT  12 UNITS SUBCUTANEOUSLY EVERY DAY  AT  10PM (Patient taking differently: INJECT  10 UNITS SUBCUTANEOUSLY EVERY DAY  AT  10PM)    meloxicam (MOBIC) 15 MG tablet TAKE 1 TABLET BY MOUTH ONCE DAILY AS NEEDED    metFORMIN (GLUCOPHAGE-XR) 500 MG 24 hr tablet  TAKE 1 TABLET BY MOUTH ONCE DAILY WITH BREAKFAST    acetaminophen (TYLENOL) 500 MG tablet Take 2 tablets (1,000 mg total) by mouth every 8 (eight) hours as needed for mild pain or moderate pain.    baclofen (LIORESAL) 10 MG tablet TAKE 1 TABLET BY MOUTH ONCE DAILY AS NEEDED MUSCLE SPASMS    bisacodyl (DULCOLAX) 5 MG EC tablet Take 5 mg by mouth daily as needed for moderate constipation. 01/30/2019: Reports using 1 tablet (5 mg)/day on 2 days in past week   Blood Glucose Monitoring Suppl (ONETOUCH VERIO) w/Device KIT 1 kit by Does not apply route 2 (two) times daily.    Cholecalciferol (VITAMIN D3) 50 MCG (2000 UT) capsule Take 1 capsule (2,000 Units total) by mouth daily.    glucose blood (ONETOUCH VERIO) test strip Use as instructed    Insulin Pen Needle (BD PEN NEEDLE NANO U/F) 32G X 4 MM MISC USE WITH LANTUS AS DIRECTED    Lancet Devices (ONE TOUCH DELICA LANCING DEV) MISC 1 Device by Does not apply route 2 (two) times daily.    lisinopril (ZESTRIL) 5 MG tablet Take 1 tablet (5 mg total) by mouth daily.    nicotine (NICODERM CQ - DOSED IN MG/24 HOURS) 14 mg/24hr patch Place onto the skin.    OneTouch Delica Lancets 62G MISC 1 Device by Does not apply route 2 (two) times daily.    polyethylene glycol powder (GLYCOLAX/MIRALAX) 17 GM/SCOOP powder Take 17-34 g by mouth daily as needed for mild constipation or  moderate constipation.    [DISCONTINUED] citalopram (CELEXA) 40 MG tablet TAKE 1 TABLET BY MOUTH EVERY DAY    No facility-administered encounter medications on file as of 08/11/2019.     Goals Addressed            This Visit's Progress    PharmD-Medication Adherence       Current Barriers:   Financial Barriers  Knowledge deficits related to coordination of her own care  Limited social support  Pharmacist Clinical Goal(s):   Over the next 30 days, patient will demonstrate improved medication adherence as evidenced by verbalized understanding of prescribed medication  regimen, assistance available, and patient report of adherence  Interventions:  Follow up with Kendra [name] eye appointment - Patient confirms that she received eye exam as scheduled on 11/12 and has just received her new eyeglasses via mail [name]  Follow up with Kendra [name] regarding pill packaging from Kendra [name] and medication adherence ? Denies any missed doses from pill packaging ? Reports using maintenance inhaler (Trelegy) daily (and rinse out mouth) ? Reports noticing meloxicam now included in pill packaging for most recent refill  ? Patient able to identify meloxicam tablet. Encourage patient to take meloxicam as needed as directed on labeling  Counsel on importance of blood sugar control and monitoring ? [name] recent blood sugar results (see below) ? Reports feeling weak/tired with CBG of 76 on 11/17. Describes appropriately treating low, rechecking CBG (brought up to 137).  ? Reports taking: ? Lantus 10 units QHS ? Metformin ER 500 mg ONCE daly from pill pack (starting [name] or yesterday) ? Note most recent refill sent in with once daily dosing, rather than previous twice daily dose ? Encourage patient to eat regular balanced meals, including breakfast  Counsel on importance of continued blood pressure monitoring and control. ? Reports taking lisinopril 2.5 mg daily from pill pack  ? [name] recent blood pressure results (see below)  Follow up with patient regarding smoking cessation. [name] reports currently smoking ~3 cigarettes/day   Coordination of care call to Kendra [name], Kendra [name] with Kendra [name] ? Alert Kendra [name] that meloxicam has been added to pill packaging for most recent refill, despite PRN direction. Kendra [name] states he will make a note to ensure not added to pill package again in future, but given to patient separately in pill bottle to use PRN as before ? Kendra [name] confirms current package contains new direction for metformin, 500 mg once daily ? Kendra [name] reports current package  contains lisinopril 2.5 mg once daily strength still, despite updated prescription sent by PCP on 9/2 and reminder from this CCM Pharmacist to pharmacy to update dose. ? Kendra [name] states prescription will be updated to lisinopril 5 mg daily with next pill pack scheduled to be delivered to patient on 12/10.  Will mail patient blood pressure and blood sugar logs again as requested Patient Self Care Activities:   Patient takes medications as directed with aid of adherence tools. o Using pill packaging from [place] on [name] set for 10 pm as reminder for insulin adminisration  Calls pharmacy for medication refills  Patient to attend scheduled medical appointments  Calls provider office for new concerns or questions   Patient to check blood sugars twice daily and keep log  Fasting Blood Glucose After Breakfast Notes  16 - November 107 199   17 - November 76*  *Reports came up to 137 after treated with 1/2 cup of regular soda  18 - November 95  18 - November 133    20 - November 125    21 - November 120    22 - November 115    23 - November 140*  *metformin dose decrease to 500 mg once daily    Patient to check blood pressure daily and keep log  BP  17 - November 141/57, HR 76  18 - November 112/74, HR 66  19 - November 116/74, HR 71  20 - November 116/59, HR 89  21 - November 120/73, HR 98  22 - November 136/83, HR 69  23 - November 134/103, HR 66 -  Blood pressure goal: < 130/80   Please see past updates related to this goal by clicking on the "Past Updates" button in the selected goal          Plan  Telephone follow up appointment with care management team member scheduled for: 11/30  [name], PharmD, [place] [facility] [phone]

## 2019-08-11 NOTE — Patient Instructions (Addendum)
Thank you allowing the Chronic Care Management Team to be a part of your care! It was a pleasure speaking with you today!     CCM (Chronic Care Management) Team    [name] RN, BSN Nurse Care Coordinator  [phone]   [name] PharmD  Clinical Pharmacist  [phone]   [name] LCSW Clinical Social Worker [phone]  Visit Information  Goals Addressed            This Visit's Progress   . PharmD-Medication Adherence       Current Barriers:  . Financial Barriers . Knowledge deficits related to coordination of her own care . Limited social support  Pharmacist Clinical Goal(s):  [name] Over the next 30 days, patient will demonstrate improved medication adherence as evidenced by verbalized understanding of prescribed medication regimen, assistance available, and patient report of adherence  Interventions:  Follow up with Ms. Eastep eye appointment - Patient confirms that she received eye exam as scheduled on 11/12 and has just received her new eyeglasses via mail today  Follow up with Ms. Decock regarding pill packaging from [employer] and medication adherence ? Denies any missed doses from pill packaging ? Reports using maintenance inhaler (Trelegy) daily (and rinse out mouth) ? Reports noticing meloxicam now included in pill packaging for most recent refill  ? Patient able to identify meloxicam tablet. Encourage patient to take meloxicam as needed as directed on labeling  Counsel on importance of blood sugar control and monitoring ? Review recent blood sugar results (see below) ? Reports feeling weak/tired with CBG of 76 on 11/17. Describes appropriately treating low, rechecking CBG (brought up to 137).  ? Reports taking: ? Lantus 10 units QHS ? Metformin ER 500 mg ONCE daly from pill pack (starting today or yesterday) ? Note most recent refill sent in with once daily dosing, rather than previous twice daily dose ? Encourage patient to eat  regular balanced meals, including breakfast  Counsel on importance of continued blood pressure monitoring and control. ? Reports taking lisinopril 2.5 mg daily from pill pack  ? Review recent blood pressure results (see below)  Follow up with patient regarding smoking cessation. Ms. Ohlsson reports currently smoking ~3 cigarettes/day   Coordination of care call to Sam, [place] with [name] ? Alert RPh that meloxicam has been added to pill packaging for most recent refill, despite PRN direction. Sam states he will make a note to ensure not added to pill package again in future, but given to patient separately in pill bottle to use PRN as before ? Sam confirms current package contains new direction for metformin, 500 mg once daily ? Sam reports current package contains lisinopril 2.5 mg once daily strength still, despite updated prescription sent by PCP on 9/2 and reminder from this CCM Pharmacist to pharmacy to update dose. ? Sam states prescription will be updated to lisinopril 5 mg daily with next pill pack scheduled to be delivered to patient on 12/10.  Will mail patient blood pressure and blood sugar logs again as requested Patient Self Care Activities:  . Patient takes medications as directed with aid of adherence tools. o Using pill packaging from [place] on phone set for 10 pm as reminder for insulin adminisration . Calls pharmacy for medication refills . Patient to attend scheduled medical appointments . Calls provider office for new concerns or questions  . Patient to check blood sugars twice daily and keep log  Fasting Blood Glucose  After Breakfast Notes  16 - November 107 199   17 - November 76*  *Reports came up to 137 after treated with 1/2 cup of regular soda  18 - November 95    19 - November 133    20 - November 125    21 - November 120    22 - November 115    23 - November 140*  *metformin dose decrease to 500 mg once daily   . Patient to check blood  pressure daily and keep log  BP  17 - November 141/57, HR 76  18 - November 112/74, HR 66  19 - November 116/74, HR 71  20 - November 116/59, HR 89  21 - November 120/73, HR 98  22 - November 136/83, HR 69  23 - November 134/103, HR 66 -  Blood pressure goal: < 130/80   Please see past updates related to this goal by clicking on the "Past Updates" button in the selected goal          The patient verbalized understanding of instructions provided today and declined a print copy of patient instruction materials.   Telephone follow up appointment with care management team member scheduled for: 11/30  [name], PharmD, [place] [phone]

## 2019-08-18 ENCOUNTER — Other Ambulatory Visit: Payer: Self-pay

## 2019-08-18 ENCOUNTER — Ambulatory Visit: Payer: Self-pay | Admitting: Pharmacist

## 2019-08-18 DIAGNOSIS — Z794 Long term (current) use of insulin: Secondary | ICD-10-CM

## 2019-08-18 DIAGNOSIS — E118 Type 2 diabetes mellitus with unspecified complications: Secondary | ICD-10-CM

## 2019-08-18 DIAGNOSIS — I10 Essential (primary) hypertension: Secondary | ICD-10-CM

## 2019-08-18 NOTE — Chronic Care Management (AMB) (Signed)
Chronic Care Management   Follow Up Note   08/18/2019 Name: Kendra [name] MRN: [id number] DOB: April 22, 1946  Referred by: [name], NP (Inactive) Reason for referral : Chronic Care Management (Patient [name] Call)   Kendra [name] is a 73 y.o. year old female who is a primary care patient of [name], NP (Inactive). The CCM team was consulted for assistance with chronic disease management and care coordination needs.  [name] has a medical history which includes but is not limited to recent CVA (11/2018), Type 2 Diabetes, hypertension, hyperlipidemia and COPD.  I reached out to Kendra [name] [name].   Review of patient status, including review of consultants reports, relevant laboratory and other test results, and collaboration with appropriate care team members and the patient's provider was performed as part of comprehensive patient evaluation and provision of chronic care management services.     Outpatient Encounter Medications as of 08/18/2019  Medication Sig Note  . acetaminophen (TYLENOL) 500 MG tablet Take 2 tablets (1,000 mg total) by mouth every 8 (eight) hours as needed for mild pain or moderate pain.   [name] albuterol (VENTOLIN HFA) 108 (90 Base) MCG/ACT inhaler INHALE 1 TO 2 PUFFS INTO THE LUNGS EVERY 6 HOURS AS NEEDED FOR WHEEZING OR SHORTNESS OF BREATH   . aspirin 81 MG EC tablet Take 1 tablet (81 mg total) by mouth daily. Swallow whole.   [name] atorvastatin (LIPITOR) 40 MG tablet TAKE 1 TABLET BY MOUTH ONCE DAILY   . baclofen (LIORESAL) 10 MG tablet TAKE 1 TABLET BY MOUTH ONCE DAILY AS NEEDED MUSCLE SPASMS   . bisacodyl (DULCOLAX) 5 MG EC tablet Take 5 mg by mouth daily as needed for moderate constipation. 01/30/2019: Reports using 1 tablet (5 mg)/day on 2 days in past week  . Blood Glucose Monitoring Suppl (ONETOUCH VERIO) w/Device KIT 1 kit by Does not apply route 2 (two) times daily.   [name] buPROPion (WELLBUTRIN XL) 150 MG 24 hr tablet  TAKE 1 TABLET BY MOUTH ONCE DAILY   . Cholecalciferol (VITAMIN D3) 50 MCG (2000 UT) capsule Take 1 capsule (2,000 Units total) by mouth daily.   . Fluticasone-Umeclidin-Vilant (TRELEGY ELLIPTA) 100-62.5-25 MCG/INH AEPB Inhale 1 puff into the lungs daily.   [name] glucose blood (ONETOUCH VERIO) test strip Use as instructed   . Insulin Glargine (LANTUS SOLOSTAR) 100 UNIT/ML Solostar Pen INJECT  12 UNITS SUBCUTANEOUSLY EVERY DAY  AT  10PM (Patient taking differently: INJECT  10 UNITS SUBCUTANEOUSLY EVERY DAY  AT  10PM)   . Insulin Pen Needle (BD PEN NEEDLE NANO U/F) 32G X 4 MM MISC USE WITH LANTUS AS DIRECTED   . Lancet Devices (ONE TOUCH DELICA LANCING DEV) MISC 1 Device by Does not apply route 2 (two) times daily.   [name] lisinopril (ZESTRIL) 5 MG tablet Take 1 tablet (5 mg total) by mouth daily.   . meloxicam (MOBIC) 15 MG tablet TAKE 1 TABLET BY MOUTH ONCE DAILY AS NEEDED   . metFORMIN (GLUCOPHAGE-XR) 500 MG 24 hr tablet TAKE 1 TABLET BY MOUTH ONCE DAILY WITH BREAKFAST   . nicotine (NICODERM CQ - DOSED IN MG/24 HOURS) 14 mg/24hr patch Place onto the skin.   [name] Lancets 32R MISC 1 Device by Does not apply route 2 (two) times daily.   . polyethylene glycol powder (GLYCOLAX/MIRALAX) 17 GM/SCOOP powder Take 17-34 g by mouth daily as needed for mild constipation or moderate constipation.    No facility-administered encounter medications on  file as of 08/18/2019.     Goals Addressed            This Visit's Progress   . PharmD-Medication Adherence       Current Barriers:  . Financial Barriers . Knowledge deficits related to coordination of her own care . Limited social support  Pharmacist Clinical Goal(s):  [name] Over the next 30 days, patient will demonstrate improved medication adherence as evidenced by verbalized understanding of prescribed medication regimen, assistance available, and patient report of adherence  Interventions:  Counsel on importance of blood sugar control and  monitoring ? [name] is unable to located CBG log [name].  ? Confirms receiving new log in mail. Encourage patient to re-start monitoring with new log  Counsel on importance of continued blood pressure monitoring and control. ? Reports BP monitor battery has run out ? Encourage patient to prioritize obtaining new battery for BP monitor  Coordination of care - patient confirms she has scheduled transportation for upcoming lab appointment on 12/2.  Patient Self Care Activities:  . Patient takes medications as directed with aid of adherence tools. o Using pill packaging from [place] on [name] set for 10 pm as reminder for insulin adminisration . Calls pharmacy for medication refills . Patient to attend scheduled medical appointments . Calls provider office for new concerns or questions  . Patient to check blood sugars twice daily and keep log . Patient to check blood pressure daily and keep log - Blood pressure goal: < 130/80   Please see past updates related to this goal by clicking on the "Past Updates" button in the selected goal          Plan  Telephone follow up appointment with care management team member scheduled for: 12/7 at 2:45 pm for review of blood pressure and blood sugar results  [name], PharmD, [place] [phone]

## 2019-08-18 NOTE — Patient Instructions (Signed)
Thank you allowing the Chronic Care Management Team to be a part of your care! It was a pleasure speaking with you today!     CCM (Chronic Care Management) Team    [name] RN, BSN Nurse Care Coordinator  [phone]   [name] PharmD  Clinical Pharmacist  [phone]   [name] LCSW Clinical Social Worker [phone]  Visit Information  Goals Addressed            This Visit's Progress   . PharmD-Medication Adherence       Current Barriers:  . Financial Barriers . Knowledge deficits related to coordination of her own care . Limited social support  Pharmacist Clinical Goal(s):  [name] Over the next 30 days, patient will demonstrate improved medication adherence as evidenced by verbalized understanding of prescribed medication regimen, assistance available, and patient report of adherence  Interventions:  Counsel on importance of blood sugar control and monitoring ? Ms. Crofford is unable to located CBG log today.  ? Confirms receiving new log in mail. Encourage patient to re-start monitoring with new log  Counsel on importance of continued blood pressure monitoring and control. ? Reports BP monitor battery has run out ? Encourage patient to prioritize obtaining new battery for BP monitor  Coordination of care - patient confirms she has scheduled transportation for upcoming lab appointment on 12/2.  Patient Self Care Activities:  . Patient takes medications as directed with aid of adherence tools. o Using pill packaging from [place] on phone set for 10 pm as reminder for insulin adminisration . Calls pharmacy for medication refills . Patient to attend scheduled medical appointments . Calls provider office for new concerns or questions  . Patient to check blood sugars twice daily and keep log . Patient to check blood pressure daily and keep log - Blood pressure goal: < 130/80   Please see past updates related to this goal by  clicking on the "Past Updates" button in the selected goal          The patient verbalized understanding of instructions provided today and declined a print copy of patient instruction materials.   Telephone follow up appointment with care management team member scheduled for: 12/7 at 2:45 pm   [name], PharmD, [place] [phone]

## 2019-08-20 ENCOUNTER — Other Ambulatory Visit: Payer: Self-pay

## 2019-08-20 ENCOUNTER — Telehealth: Payer: Self-pay

## 2019-08-20 ENCOUNTER — Other Ambulatory Visit: Payer: Medicare Other

## 2019-08-20 ENCOUNTER — Ambulatory Visit: Payer: Medicare Other | Admitting: Nurse Practitioner

## 2019-08-20 DIAGNOSIS — I10 Essential (primary) hypertension: Secondary | ICD-10-CM

## 2019-08-20 DIAGNOSIS — E118 Type 2 diabetes mellitus with unspecified complications: Secondary | ICD-10-CM

## 2019-08-20 DIAGNOSIS — Z794 Long term (current) use of insulin: Secondary | ICD-10-CM

## 2019-08-20 DIAGNOSIS — E785 Hyperlipidemia, unspecified: Secondary | ICD-10-CM

## 2019-08-20 NOTE — Telephone Encounter (Signed)
Added labs for upcoming appt.

## 2019-08-21 LAB — LIPID PANEL
Cholesterol: 108 mg/dL (ref <200)
HDL: 50 mg/dL (ref >=50)
LDL Cholesterol (Calc): 43 mg/dL (calc)
Non-HDL Cholesterol (Calc): 58 mg/dL (calc) (ref <130)
Total CHOL/HDL Ratio: 2.2 (calc) (ref <5.0)
Triglycerides: 70 mg/dL (ref <150)

## 2019-08-21 LAB — HEMOGLOBIN A1C
Hgb A1c MFr Bld: 6 % of total Hgb — ABNORMAL HIGH (ref <5.7)
Mean Plasma Glucose: 126 (calc)
eAG (mmol/L): 7 (calc)

## 2019-08-25 ENCOUNTER — Other Ambulatory Visit: Payer: Self-pay

## 2019-08-25 ENCOUNTER — Ambulatory Visit: Payer: Self-pay | Admitting: Pharmacist

## 2019-08-25 ENCOUNTER — Telehealth: Payer: Self-pay

## 2019-08-25 DIAGNOSIS — E118 Type 2 diabetes mellitus with unspecified complications: Secondary | ICD-10-CM

## 2019-08-25 DIAGNOSIS — I10 Essential (primary) hypertension: Secondary | ICD-10-CM

## 2019-08-25 DIAGNOSIS — F17200 Nicotine dependence, unspecified, uncomplicated: Secondary | ICD-10-CM

## 2019-08-25 DIAGNOSIS — Z794 Long term (current) use of insulin: Secondary | ICD-10-CM

## 2019-08-25 NOTE — Patient Instructions (Signed)
Thank you allowing the Chronic Care Management Team to be a part of your care! It was a pleasure speaking with you today!     CCM (Chronic Care Management) Team    [name] RN, BSN Nurse Care Coordinator  [phone]   [name] PharmD  Clinical Pharmacist  [phone]   [name] LCSW Clinical Social Worker [phone]  Visit Information  Goals Addressed            This Visit's Progress   . PharmD-Medication Adherence       Current Barriers:  . Financial Barriers . Knowledge deficits related to coordination of her own care . Limited social support  Pharmacist Clinical Goal(s):  [name] Over the next 30 days, patient will demonstrate improved medication adherence as evidenced by verbalized understanding of prescribed medication regimen, assistance available, and patient report of adherence  Interventions:  Counsel on importance of blood sugar control and monitoring ? Review recent blood sugar results (see below) ? Denies any recent low blood sugars ? Unable to find readings prior to 12/3 today ? Patient reports that she has missed breakfast several time in past week. Encourage patient to eat regular balanced meals, including breakfast ? Note with most recent renewal, metformin ER sent in with once daily dosing, rather than previous twice daily dose  Counsel on importance of continued blood pressure monitoring and control. ? Reports taking lisinopril 2.5 mg daily from pill pack  ? Denies any missed doses ? Note patient still taking lisinopril 2.5 mg daily ? Review recent blood pressure results (see below) ? Unable to find readings prior to 12/3; last week patient's monitor needed new battery ? Counsel on BP monitoring technique, including resting quietly >5 minutes before checking BP ? Ms. Galica denies any headache, chest pain, shortness of breath, back pain, numbness/weakness, change in vision, or difficulty speaking with reading ?180/120 yesterday  evening  Counsel Ms. Klinkner if reading ?180/120 of importance of checking blood pressure again after 5 minutes and if reading ?180/120, contact PCP.  Review importance of seeking Urgent Medical Care if chest pain, shortness of breath, back pain, numbness/weakness, change in vision, or difficulty speaking with reading ?180/120  Follow up with patient regarding smoking cessation.  ? Ms. Audi reports reduced smoking to ~2 cigarettes/day  ? Reports switched from nicotine patch to nicotine lozenges. Reports uses as directed when has urge to smoke (currently ~4 lozenges/day) ? Provide patient with phone number for [place] [employer] again today.  Will collaborate with [name] regarding patient's hypertension management and diabetes management  Patient Self Care Activities:  . Patient takes medications as directed with aid of adherence tools. o Using pill packaging from [place] on phone set for 10 pm as reminder for insulin adminisration . Calls pharmacy for medication refills . Patient to attend scheduled medical appointments . Calls provider office for new concerns or questions  . Patient to check blood sugars twice daily and keep log  Fasting Blood Glucose After Breakfast  3 - December 141 180  4 - December 105   5 - December 110 120  6 - December 106 115  7 - December 129 -   . Patient to check blood pressure daily and keep log  AM BP PM BP Notes  3 - December  151/85, HR 78   4 - December  163/96, HR 80   5 - December  163/92, HR 88   6 - December  195/132, HR 66;*  137/92, HR 77 *Taken ~2 hours apart  7 - December 152/105, HR 66   -  Blood pressure goal: < 130/80   Please see past updates related to this goal by clicking on the "Past Updates" button in the selected goal          The patient verbalized understanding of instructions provided today and declined a print copy of patient instruction materials.   The care management team will reach out to  the patient again over the next 7 days.   [name], PharmD, [place] [employer] [phone]

## 2019-08-25 NOTE — Chronic Care Management (AMB) (Deleted)
  Chronic Care Management   Follow Up Note   08/25/2019 Name: Kendra [name] MRN: [id number] DOB: 1946/08/29  Referred by: [name], NP (Inactive) Reason for referral : Chronic Care Management (Patient Phone Call)   Kendra [name] is a 73 y.o. year old female who is a primary care patient of [name], NP (Inactive). The CCM team was consulted for assistance with chronic disease management and care coordination needs.  [name] has a medical history which includes but is not limited to recent CVA (11/2018), Type 2 Diabetes, hypertension, hyperlipidemia and COPD.  Was unable to reach patient via telephone today and have left HIPAA compliant voicemail asking patient to return my call.   Plan  The care management team will reach out to the patient again over the next 14 days.   [name], PharmD, [place] [employer] [phone]

## 2019-08-25 NOTE — Chronic Care Management (AMB) (Signed)
Chronic Care Management   Follow Up Note   08/25/2019 Name: Kendra [name] MRN: [id number] DOB: July 22, 1946  Referred by: [name], NP (Inactive) Reason for referral : Chronic Care Management (Patient Phone Call)   Kendra [name] is a 73 y.o. year old female who is a primary care patient of [name], NP (Inactive). The CCM team was consulted for assistance with chronic disease management and care coordination needs.  Kendra [name] has a medical history which includes but is not limited to recent CVA (11/2018), Type 2 Diabetes, hypertension, hyperlipidemia and COPD.  Was unable to reach patient via telephone and left HIPAA compliant voicemail asking patient to return my call.  Receive call back from Kendra [name].  Review of patient status, including review of consultants reports, relevant laboratory and other test results, and collaboration with appropriate care team members and the patient's provider was performed as part of comprehensive patient evaluation and provision of chronic care management services.    Objective  Lab Results  Component Value Date   HGBA1C 6.0 (H) 08/20/2019    Outpatient Encounter Medications as of 08/25/2019  Medication Sig Note   nicotine polacrilex (COMMIT) 4 MG lozenge Take 4 mg by mouth as needed for smoking cessation.    acetaminophen (TYLENOL) 500 MG tablet Take 2 tablets (1,000 mg total) by mouth every 8 (eight) hours as needed for mild pain or moderate pain.    albuterol (VENTOLIN HFA) 108 (90 Base) MCG/ACT inhaler INHALE 1 TO 2 PUFFS INTO THE LUNGS EVERY 6 HOURS AS NEEDED FOR WHEEZING OR SHORTNESS OF BREATH    aspirin 81 MG EC tablet Take 1 tablet (81 mg total) by mouth daily. Swallow whole.    atorvastatin (LIPITOR) 40 MG tablet TAKE 1 TABLET BY MOUTH ONCE DAILY    baclofen (LIORESAL) 10 MG tablet TAKE 1 TABLET BY MOUTH ONCE DAILY AS NEEDED MUSCLE SPASMS    bisacodyl (DULCOLAX) 5 MG EC tablet Take 5 mg by  mouth daily as needed for moderate constipation. 01/30/2019: Reports using 1 tablet (5 mg)/day on 2 days in past week   Blood Glucose Monitoring Suppl (ONETOUCH VERIO) w/Device KIT 1 kit by Does not apply route 2 (two) times daily.    buPROPion (WELLBUTRIN XL) 150 MG 24 hr tablet TAKE 1 TABLET BY MOUTH ONCE DAILY    Cholecalciferol (VITAMIN D3) 50 MCG (2000 UT) capsule Take 1 capsule (2,000 Units total) by mouth daily.    Fluticasone-Umeclidin-Vilant (TRELEGY ELLIPTA) 100-62.5-25 MCG/INH AEPB Inhale 1 puff into the lungs daily.    glucose blood (ONETOUCH VERIO) test strip Use as instructed    Insulin Glargine (LANTUS SOLOSTAR) 100 UNIT/ML Solostar Pen INJECT  12 UNITS SUBCUTANEOUSLY EVERY DAY  AT  10PM (Patient taking differently: INJECT  10 UNITS SUBCUTANEOUSLY EVERY DAY  AT  10PM)    Insulin Pen Needle (BD PEN NEEDLE NANO U/F) 32G X 4 MM MISC USE WITH LANTUS AS DIRECTED    Lancet Devices (ONE TOUCH DELICA LANCING DEV) MISC 1 Device by Does not apply route 2 (two) times daily.    lisinopril (ZESTRIL) 5 MG tablet Take 1 tablet (5 mg total) by mouth daily.    meloxicam (MOBIC) 15 MG tablet TAKE 1 TABLET BY MOUTH ONCE DAILY AS NEEDED    metFORMIN (GLUCOPHAGE-XR) 500 MG 24 hr tablet TAKE 1 TABLET BY MOUTH ONCE DAILY WITH BREAKFAST    OneTouch Delica Lancets 49F MISC 1 Device by Does not apply route 2 (two) times daily.  polyethylene glycol powder (GLYCOLAX/MIRALAX) 17 GM/SCOOP powder Take 17-34 g by mouth daily as needed for mild constipation or moderate constipation.    [DISCONTINUED] nicotine (NICODERM CQ - DOSED IN MG/24 HOURS) 14 mg/24hr patch Place onto the skin.    No facility-administered encounter medications on file as of 08/25/2019.     Goals Addressed            This Visit's Progress    PharmD-Medication Adherence       Current Barriers:   Financial Barriers  Knowledge deficits related to coordination of her own care  Limited social support  Note lots of  commotion in patient's home during this call. Other people in background and patient distracted during call despite attempt by patient to go to another room for quiet.  Pharmacist Clinical Goal(s):   Over the next 30 days, patient will demonstrate improved medication adherence as evidenced by verbalized understanding of prescribed medication regimen, assistance available, and patient report of adherence  Interventions:  Counsel on importance of blood sugar control and monitoring ? Review recent blood sugar results (see below) ? Denies any recent low blood sugars ? Unable to find readings prior to 12/3 today ? Patient reports that she has missed breakfast several time in past week. Encourage patient to eat regular balanced meals, including breakfast ? Note with most recent renewal, metformin ER sent in with once daily dosing, rather than previous twice daily dose  Counsel on importance of continued blood pressure monitoring and control. ? Reports taking lisinopril 2.5 mg daily from pill pack  ? Denies any missed doses ? Note patient still taking lisinopril 2.5 mg daily, despite updated prescription sent by PCP for liisnopril 5 mg daily on 9/2 and reminders from this CM Pharmacist. [name] RPh, has stated that increase to lisinopril 5 mg daily with next pill pack scheduled to be delivered to patient on 12/10 ? Review recent blood pressure results (see below) ? Unable to find readings prior to 12/3; last week patient's monitor needed new battery ? Counsel on BP monitoring technique, including resting quietly >5 minutes before checking BP ? Kendra [name] denies any headache, chest pain, shortness of breath, back pain, numbness/weakness, change in vision, or difficulty speaking with reading ?180/120 yesterday evening  Counsel Kendra [name] if reading ?180/120 of importance of checking blood pressure again after 5 minutes and if reading ?180/120, contact PCP.  Review importance of seeking  Urgent Medical Care if chest pain, shortness of breath, back pain, numbness/weakness, change in vision, or difficulty speaking with reading ?180/120  Follow up with patient regarding smoking cessation.  ? Kendra [name] reports reduced smoking to ~2 cigarettes/day  ? Reports switched from nicotine patch to nicotine lozenges. Reports uses as directed when has urge to smoke (currently ~4 lozenges/day) ? Provide patient with phone number for [place] [employer] again today.  Will collaborate with [name] regarding patient's hypertension management and diabetes management  Patient Self Care Activities:   Patient takes medications as directed with aid of adherence tools. o Using pill packaging from [place] on phone set for 10 pm as reminder for insulin adminisration  Calls pharmacy for medication refills  Patient to attend scheduled medical appointments  Calls provider office for new concerns or questions   Patient to check blood sugars twice daily and keep log  Fasting Blood Glucose After Breakfast  3 - December 141 180  4 - December 105   5 - December 110 120  6 -  December 106 115  7 - December 129 -    Patient to check blood pressure daily and keep log  AM BP PM BP Notes  3 - December  151/85, HR 78   4 - December  163/96, HR 80   5 - December  163/92, HR 88   6 - December  195/132, HR 66;* 137/92, HR 77 *Taken ~2 hours apart  7 - December 152/105, HR 66   -  Blood pressure goal: < 130/80   Please see past updates related to this goal by clicking on the "Past Updates" button in the selected goal          Plan  The care management team will reach out to the patient again over the next 7 days.   [name], PharmD, [place] [employer] [phone]

## 2019-08-27 ENCOUNTER — Ambulatory Visit (INDEPENDENT_AMBULATORY_CARE_PROVIDER_SITE_OTHER): Payer: Medicare Other | Admitting: Family Medicine

## 2019-08-27 ENCOUNTER — Other Ambulatory Visit: Payer: Self-pay

## 2019-08-27 ENCOUNTER — Ambulatory Visit: Payer: Self-pay | Admitting: Pharmacist

## 2019-08-27 ENCOUNTER — Encounter: Payer: Self-pay | Admitting: Family Medicine

## 2019-08-27 DIAGNOSIS — E118 Type 2 diabetes mellitus with unspecified complications: Secondary | ICD-10-CM | POA: Diagnosis not present

## 2019-08-27 DIAGNOSIS — J432 Centrilobular emphysema: Secondary | ICD-10-CM

## 2019-08-27 DIAGNOSIS — Z794 Long term (current) use of insulin: Secondary | ICD-10-CM

## 2019-08-27 DIAGNOSIS — I10 Essential (primary) hypertension: Secondary | ICD-10-CM

## 2019-08-27 MED ORDER — TRELEGY ELLIPTA 100-62.5-25 MCG/INH IN AEPB: 1.0000 | INHALATION_SPRAY | Freq: Every day | RESPIRATORY_TRACT | 3 refills | Status: DC

## 2019-08-27 MED ORDER — METFORMIN HCL ER 500 MG PO TB24: 500.0000 mg | ORAL_TABLET | Freq: Two times a day (BID) | ORAL | 3 refills | Status: DC

## 2019-08-27 NOTE — Chronic Care Management (AMB) (Signed)
Chronic Care Management   Follow Up Note   08/27/2019 Name: Kendra [name] MRN: [id number] DOB: [birth year]  Referred by: [name], NP (Inactive) Reason for referral : Care Coordination ([name])   DORATHY STALLONE is a 73 y.o. year old female who is a primary care patient of [name], NP (Inactive). The CCM team was consulted for assistance with chronic disease management and care coordination needs.  Ms. Youngberg has a medical history which includes but is not limited to recent CVA (11/2018), Type 2 Diabetes, hypertension, hyperlipidemia and COPD.  Coordination of care call to Sam, [place] at [facility].  Review of patient status, including review of consultants reports, relevant laboratory and other test results, and collaboration with appropriate care team members and the patient's provider was performed as part of comprehensive patient evaluation and provision of chronic care management services.     Outpatient Encounter Medications as of 08/27/2019  Medication Sig Note  . acetaminophen (TYLENOL) 500 MG tablet Take 2 tablets (1,000 mg total) by mouth every 8 (eight) hours as needed for mild pain or moderate pain.   [name] albuterol (VENTOLIN HFA) 108 (90 Base) MCG/ACT inhaler INHALE 1 TO 2 PUFFS INTO THE LUNGS EVERY 6 HOURS AS NEEDED FOR WHEEZING OR SHORTNESS OF BREATH   . aspirin 81 MG EC tablet Take 1 tablet (81 mg total) by mouth daily. Swallow whole.   [name] atorvastatin (LIPITOR) 40 MG tablet TAKE 1 TABLET BY MOUTH ONCE DAILY   . baclofen (LIORESAL) 10 MG tablet TAKE 1 TABLET BY MOUTH ONCE DAILY AS NEEDED MUSCLE SPASMS   . bisacodyl (DULCOLAX) 5 MG EC tablet Take 5 mg by mouth daily as needed for moderate constipation. 01/30/2019: Reports using 1 tablet (5 mg)/day on 2 days in past week  . Blood Glucose Monitoring Suppl (ONETOUCH VERIO) w/Device KIT 1 kit by Does not apply route 2 (two) times daily.   [name] buPROPion (WELLBUTRIN XL) 150 MG 24 hr tablet TAKE 1  TABLET BY MOUTH ONCE DAILY   . Cholecalciferol (VITAMIN D3) 50 MCG (2000 UT) capsule Take 1 capsule (2,000 Units total) by mouth daily.   . Fluticasone-Umeclidin-Vilant (TRELEGY ELLIPTA) 100-62.5-25 MCG/INH AEPB Inhale 1 puff into the lungs daily.   [name] glucose blood (ONETOUCH VERIO) test strip Use as instructed   . Insulin Pen Needle (BD PEN NEEDLE NANO U/F) 32G X 4 MM MISC USE WITH LANTUS AS DIRECTED   . Lancet Devices (ONE TOUCH DELICA LANCING DEV) MISC 1 Device by Does not apply route 2 (two) times daily.   [name] lisinopril (ZESTRIL) 5 MG tablet Take 1 tablet (5 mg total) by mouth daily.   . meloxicam (MOBIC) 15 MG tablet TAKE 1 TABLET BY MOUTH ONCE DAILY AS NEEDED   . metFORMIN (GLUCOPHAGE-XR) 500 MG 24 hr tablet Take 1 tablet (500 mg total) by mouth 2 (two) times daily before a meal.   . nicotine polacrilex (COMMIT) 4 MG lozenge Take 4 mg by mouth as needed for smoking cessation.   [name] Lancets 72Z MISC 1 Device by Does not apply route 2 (two) times daily.   . polyethylene glycol powder (GLYCOLAX/MIRALAX) 17 GM/SCOOP powder Take 17-34 g by mouth daily as needed for mild constipation or moderate constipation.    No facility-administered encounter medications on file as of 08/27/2019.     Goals Addressed            This Visit's Progress   . PharmD-Medication Adherence  Current Barriers:  . Financial Barriers . Knowledge deficits related to coordination of her own care . Limited social support  Pharmacist Clinical Goal(s):  [name] Over the next 30 days, patient will demonstrate improved medication adherence as evidenced by verbalized understanding of prescribed medication regimen, assistance available, and patient report of adherence  Interventions:  Coordinated care with [name] re: patient's diabetes management and hypertension management  Perform chart review  ? Patient seen by [name] today for virtual office visit ? Provider advised patient to: ?  Discontinue Lantus  ? Restart metformin ER 500 mg twice daily (new Rx sent to pharmacy) ? Start lisinopril 5 mg daily as previously prescribed (to be included in pill pack) ? Need for new Trelegy inhaler identified (Rx sent to pharmacy)  Coordination of care call with [name] RPh, Sam ? Sam confirms that next pill pack (to be delivered by 12/11) will be sent with: ? Lisinopril 5 mg once daily ? Metformin ER 500 mg twice daily ? Meloxicam and baclofen (as needed medications for patient) will NOT be including in pill pack, but sent in bottle to facilitate patient taking as needed ? Also confirms: ? Stop of insulin glargine as directed by provider ? Refill of Trelegy inhaler to be delivered  Patient Self Care Activities:  . Patient takes medications as directed with aid of adherence tools. o Using pill packaging from [place] on phone set for 10 pm as reminder for insulin adminisration . Calls pharmacy for medication refills . Patient to attend scheduled medical appointments . Calls provider office for new concerns or questions  . Patient to check blood sugars twice daily and keep log . Patient to check blood pressure daily and keep log - Blood pressure goal: < 130/80   Please see past updates related to this goal by clicking on the "Past Updates" button in the selected goal          Plan  Telephone follow up appointment with care management team member scheduled for: 12/14 at 3 pm  [name], PharmD, [place] [phone]

## 2019-08-27 NOTE — Progress Notes (Signed)
Virtual Visit via Telephone The purpose of this virtual visit is to provide medical care while limiting exposure to the novel coronavirus (COVID19) for both patient and office staff.  Consent was obtained for phone visit:  Yes.   Answered questions that patient had about telehealth interaction:  Yes.   I discussed the limitations, risks, security and privacy concerns of performing an evaluation and management service by telephone. I also discussed with the patient that there may be a patient responsible charge related to this service. The patient expressed understanding and agreed to proceed.  Patient Location: Home Provider Location: [facility])  ---------------------------------------------------------------------- Chief Complaint  Patient presents with  . Diabetes    S: Reviewed CMA documentation. I have called patient and gathered additional HPI as follows:  CHRONIC DM, Type 2: Reports no concerns. Last A1c down to 6.0, she is working with [employer], identified issue with last pill pack having Metformin ER '500mg'$  daily instead of the twice a day. CBGs: Reviewed Meds: Lantus 10u daily, Metformin ER '500mg'$  daily (was twice daily but awaiting new pillpack) Reports good compliance. Tolerating well w/o side-effects Currently on ACEi  Denies hypoglycemia, polyuria, visual changes, numbness or tingling.  CHRONIC HTN: Reports improved BP readings. Had issue with dose in pillpack. Current Meds - Lisinopril '5mg'$  daily   Reports good compliance, took meds today. Tolerating well, w/o complaints. Denies CP, dyspnea, HA, edema, dizziness / lightheadedness  Centrilobular Emphysema Chronic problem. On trelegy daily maintenance inhaler with improvement. She needs new inhaler. Received "red inhaler" which is albuterol, she was told it may not help as much anymore. I advised use it PRN only for cough wheezing dyspnea. Request re order trelegy  Denies any high risk travel  to areas of current concern for COVID19. Denies any known or suspected exposure to person with or possibly with COVID19.  Denies any fevers, chills, sweats, body ache, cough, shortness of breath, sinus pain or pressure, headache, abdominal pain, diarrhea  Past Medical History:  Diagnosis Date  . Back pain   . Cataract   . Cervical disc disorder   . Chest pain   . COPD (chronic obstructive pulmonary disease) ([place])   . Depression   . Diabetes ([place])   . Diabetic neuropathy ([place])   . GERD (gastroesophageal reflux disease)   . Hyperlipidemia   . Insomnia   . Neuropathy   . Osteoarthritis   . Osteopenia   . Reflux   . Rheumatoid arthritis ([place])   . Sleep apnea    no CPAP and no suggestion that she needed one per pt  . Stroke ([place]) 2015   and again 2017  - Weakness in left leg, staggers w/ walking  . Tenosynovitis    Social History   Tobacco Use  . Smoking status: Current Some Day Smoker    Packs/day: 0.25    Types: Cigarettes  . Smokeless tobacco: Current User  Substance Use Topics  . Alcohol use: No  . Drug use: No    Current Outpatient Medications:  .  acetaminophen (TYLENOL) 500 MG tablet, Take 2 tablets (1,000 mg total) by mouth every 8 (eight) hours as needed for mild pain or moderate pain., Disp: 270 tablet, Rfl: 1 .  albuterol (VENTOLIN HFA) 108 (90 Base) MCG/ACT inhaler, INHALE 1 TO 2 PUFFS INTO THE LUNGS EVERY 6 HOURS AS NEEDED FOR WHEEZING OR SHORTNESS OF BREATH, Disp: 18 g, Rfl: 2 .  aspirin 81 MG EC tablet, Take 1 tablet (81 mg total)  by mouth daily. Swallow whole., Disp: 90 tablet, Rfl: 3 .  atorvastatin (LIPITOR) 40 MG tablet, TAKE 1 TABLET BY MOUTH ONCE DAILY, Disp: 90 tablet, Rfl: 0 .  baclofen (LIORESAL) 10 MG tablet, TAKE 1 TABLET BY MOUTH ONCE DAILY AS NEEDED MUSCLE SPASMS, Disp: 90 each, Rfl: 1 .  bisacodyl (DULCOLAX) 5 MG EC tablet, Take 5 mg by mouth daily as needed for moderate constipation., Disp: , Rfl:  .  Blood Glucose Monitoring Suppl (ONETOUCH  VERIO) w/Device KIT, 1 kit by Does not apply route 2 (two) times daily., Disp: 1 kit, Rfl: 0 .  buPROPion (WELLBUTRIN XL) 150 MG 24 hr tablet, TAKE 1 TABLET BY MOUTH ONCE DAILY, Disp: 90 tablet, Rfl: 0 .  Cholecalciferol (VITAMIN D3) 50 MCG (2000 UT) capsule, Take 1 capsule (2,000 Units total) by mouth daily., Disp: 90 capsule, Rfl: 1 .  Fluticasone-Umeclidin-Vilant (TRELEGY ELLIPTA) 100-62.5-25 MCG/INH AEPB, Inhale 1 puff into the lungs daily., Disp: 60 each, Rfl: 3 .  glucose blood (ONETOUCH VERIO) test strip, Use as instructed, Disp: 200 each, Rfl: 4 .  Insulin Pen Needle (BD PEN NEEDLE NANO U/F) 32G X 4 MM MISC, USE WITH LANTUS AS DIRECTED, Disp: 90 each, Rfl: 3 .  Lancet Devices (ONE TOUCH DELICA LANCING DEV) MISC, 1 Device by Does not apply route 2 (two) times daily., Disp: 1 each, Rfl: 0 .  lisinopril (ZESTRIL) 5 MG tablet, Take 1 tablet (5 mg total) by mouth daily., Disp: 30 tablet, Rfl: 5 .  meloxicam (MOBIC) 15 MG tablet, TAKE 1 TABLET BY MOUTH ONCE DAILY AS NEEDED, Disp: 90 tablet, Rfl: 0 .  metFORMIN (GLUCOPHAGE-XR) 500 MG 24 hr tablet, Take 1 tablet (500 mg total) by mouth 2 (two) times daily before a meal., Disp: 60 tablet, Rfl: 3 .  nicotine polacrilex (COMMIT) 4 MG lozenge, Take 4 mg by mouth as needed for smoking cessation., Disp: , Rfl:  .  OneTouch Delica Lancets 36O MISC, 1 Device by Does not apply route 2 (two) times daily., Disp: 200 each, Rfl: 4 .  polyethylene glycol powder (GLYCOLAX/MIRALAX) 17 GM/SCOOP powder, Take 17-34 g by mouth daily as needed for mild constipation or moderate constipation., Disp: 255 g, Rfl: 1  Depression screen [facility] 2/9 04/30/2019 03/05/2019 05/07/2018  Decreased Interest '3 2 2  '$ Down, Depressed, Hopeless '2 2 3  '$ PHQ - 2 Score '5 4 5  '$ Altered sleeping '2 1 3  '$ Tired, decreased energy '1 2 3  '$ Change in appetite 0 1 0  Feeling bad or failure about yourself  0 2 2  Trouble concentrating 0 3 3  Moving slowly or fidgety/restless '1 2 3  '$ Suicidal thoughts 0 1 0   PHQ-9 Score '9 16 19  '$ Difficult doing work/chores Not difficult at all Not difficult at all Not difficult at all    GAD 7 : Generalized Anxiety Score 04/30/2019 02/17/2019  Nervous, Anxious, on Edge 0 (No Data)  Control/stop worrying 0 -  Worry too much - different things 2 -  Trouble relaxing 0 -  Restless 1 -  Easily annoyed or irritable 3 -  Afraid - awful might happen 0 -  Total GAD 7 Score 6 -  Anxiety Difficulty Not difficult at all -    -------------------------------------------------------------------------- O: No physical exam performed due to remote telephone encounter.  Lab results reviewed.  Recent Labs    09/30/18 1113 05/21/19 1104 08/20/19 0802  HGBA1C 7.8* 6.0* 6.0*     Recent Results (from the past 2160 hour(s))  Hemoglobin A1C     Status: Abnormal   Collection Time: 08/20/19  8:02 AM  Result Value Ref Range   Hgb A1c MFr Bld 6.0 (H) <5.7 % of total Hgb    Comment: For someone without known diabetes, a hemoglobin  A1c value between 5.7% and 6.4% is consistent with prediabetes and should be confirmed with a  follow-up test. . For someone with known diabetes, a value <7% indicates that their diabetes is well controlled. A1c targets should be individualized based on duration of diabetes, age, comorbid conditions, and other considerations. . This assay result is consistent with an increased risk of diabetes. . Currently, no consensus exists regarding use of hemoglobin A1c for diagnosis of diabetes for children. .    Mean Plasma Glucose 126 (calc)   eAG (mmol/L) 7.0 (calc)  Lipid panel     Status: None   Collection Time: 08/20/19  8:02 AM  Result Value Ref Range   Cholesterol 108 <200 mg/dL   HDL 50 > OR = 50 mg/dL   Triglycerides 70 <150 mg/dL   LDL Cholesterol (Calc) 43 mg/dL (calc)    Comment: Reference range: <100 . Desirable range <100 mg/dL for primary prevention;   <70 mg/dL for patients with CHD or diabetic patients  with > or = 2  CHD risk factors. [name] LDL-C is now calculated using the Martin-Hopkins  calculation, which is a validated novel method providing  better accuracy than the Friedewald equation in the  estimation of LDL-C.  Cresenciano Genre et al. Annamaria Helling. 6962;952(84): 2061-2068  (http://education.[employer].com/faq/FAQ164)    Total CHOL/HDL Ratio 2.2 <5.0 (calc)   Non-HDL Cholesterol (Calc) 58 <130 mg/dL (calc)    Comment: For patients with diabetes plus 1 major ASCVD risk  factor, treating to a non-HDL-C goal of <100 mg/dL  (LDL-C of <70 mg/dL) is considered a therapeutic  option.     -------------------------------------------------------------------------- A&P:  Problem List Items Addressed This Visit    Diabetes mellitus type 2, controlled, with complications ([place]) - Primary   Relevant Medications   metFORMIN (GLUCOPHAGE-XR) 500 MG 24 hr tablet   Centrilobular emphysema (HCC)   Relevant Medications   Fluticasone-Umeclidin-Vilant (TRELEGY ELLIPTA) 100-62.5-25 MCG/INH AEPB     #DM Controlled A1c 6.0 - Discontinue Lantus 10u daily to avoid hypoglycemia risk - RESTART Metformin ER '500mg'$  BID (was on daily temporarily after rx was incorrectly changed for pillpack) Return 3 months A1c  #Emphysema, centrilobular Controlled on Trelegy Re order maintenance therapy Has albuterol, advise PRN use only  #HTN Improved control Confirm Lisinopril '5mg'$  daily in pillpack upcoming    Meds ordered this encounter  Medications  . metFORMIN (GLUCOPHAGE-XR) 500 MG 24 hr tablet    Sig: Take 1 tablet (500 mg total) by mouth 2 (two) times daily before a meal.    Dispense:  60 tablet    Refill:  3    Note for pill pack to include Metformin ER '500mg'$  TWICE daily now  . Fluticasone-Umeclidin-Vilant (TRELEGY ELLIPTA) 100-62.5-25 MCG/INH AEPB    Sig: Inhale 1 puff into the lungs daily.    Dispense:  60 each    Refill:  3    Follow-up: - Return in 3 months for Diabetes A1c / COPD / HTN  Patient verbalizes  understanding with the above medical recommendations including the limitation of remote medical advice.  Specific follow-up and call-back criteria were given for patient to follow-up or seek medical care more urgently if needed.   - Time spent in direct consultation with patient  on phone: 11 minutes   Nobie Putnam, [facility] 08/27/2019, 9:51 AM

## 2019-08-28 ENCOUNTER — Telehealth: Payer: Self-pay

## 2019-09-01 ENCOUNTER — Ambulatory Visit (INDEPENDENT_AMBULATORY_CARE_PROVIDER_SITE_OTHER): Payer: Medicare Other | Admitting: Pharmacist

## 2019-09-01 ENCOUNTER — Telehealth: Payer: Self-pay

## 2019-09-01 DIAGNOSIS — Z794 Long term (current) use of insulin: Secondary | ICD-10-CM | POA: Diagnosis not present

## 2019-09-01 DIAGNOSIS — E118 Type 2 diabetes mellitus with unspecified complications: Secondary | ICD-10-CM

## 2019-09-01 DIAGNOSIS — J432 Centrilobular emphysema: Secondary | ICD-10-CM

## 2019-09-01 DIAGNOSIS — I10 Essential (primary) hypertension: Secondary | ICD-10-CM

## 2019-09-01 NOTE — Chronic Care Management (AMB) (Signed)
Chronic Care Management   Follow Up Note   09/01/2019 Name: NADEAN MONTANARO MRN: [id number] DOB: 08/13/1946  Referred by: [name], NP (Inactive) Reason for referral : Chronic Care Management (Patient Phone Call)   SHIANE WENBERG is a 73 y.o. year old female who is a primary care patient of [name], NP (Inactive). The CCM team was consulted for assistance with chronic disease management and care coordination needs.  Ms. Riga has a medical history which includes but is not limited to recent CVA (11/2018), Type 2 Diabetes, hypertension, hyperlipidemia and COPD.  I reached out to [name] by phone today.   Review of patient status, including review of consultants reports, relevant laboratory and other test results, and collaboration with appropriate care team members and the patient's provider was performed as part of comprehensive patient evaluation and provision of chronic care management services.     Outpatient Encounter Medications as of 09/01/2019  Medication Sig Note  . albuterol (VENTOLIN HFA) 108 (90 Base) MCG/ACT inhaler INHALE 1 TO 2 PUFFS INTO THE LUNGS EVERY 6 HOURS AS NEEDED FOR WHEEZING OR SHORTNESS OF BREATH   . Fluticasone-Umeclidin-Vilant (TRELEGY ELLIPTA) 100-62.5-25 MCG/INH AEPB Inhale 1 puff into the lungs daily.   [name] lisinopril (ZESTRIL) 5 MG tablet Take 1 tablet (5 mg total) by mouth daily.   . metFORMIN (GLUCOPHAGE-XR) 500 MG 24 hr tablet Take 1 tablet (500 mg total) by mouth 2 (two) times daily before a meal.   . acetaminophen (TYLENOL) 500 MG tablet Take 2 tablets (1,000 mg total) by mouth every 8 (eight) hours as needed for mild pain or moderate pain.   [name] aspirin 81 MG EC tablet Take 1 tablet (81 mg total) by mouth daily. Swallow whole.   [name] atorvastatin (LIPITOR) 40 MG tablet TAKE 1 TABLET BY MOUTH ONCE DAILY   . baclofen (LIORESAL) 10 MG tablet TAKE 1 TABLET BY MOUTH ONCE DAILY AS NEEDED MUSCLE SPASMS   . bisacodyl  (DULCOLAX) 5 MG EC tablet Take 5 mg by mouth daily as needed for moderate constipation. 01/30/2019: Reports using 1 tablet (5 mg)/day on 2 days in past week  . Blood Glucose Monitoring Suppl (ONETOUCH VERIO) w/Device KIT 1 kit by Does not apply route 2 (two) times daily.   [name] buPROPion (WELLBUTRIN XL) 150 MG 24 hr tablet TAKE 1 TABLET BY MOUTH ONCE DAILY   . Cholecalciferol (VITAMIN D3) 50 MCG (2000 UT) capsule Take 1 capsule (2,000 Units total) by mouth daily.   [name] glucose blood (ONETOUCH VERIO) test strip Use as instructed   . Insulin Pen Needle (BD PEN NEEDLE NANO U/F) 32G X 4 MM MISC USE WITH LANTUS AS DIRECTED   . Lancet Devices (ONE TOUCH DELICA LANCING DEV) MISC 1 Device by Does not apply route 2 (two) times daily.   . meloxicam (MOBIC) 15 MG tablet TAKE 1 TABLET BY MOUTH ONCE DAILY AS NEEDED   . nicotine polacrilex (COMMIT) 4 MG lozenge Take 4 mg by mouth as needed for smoking cessation.   [name] Lancets 26S MISC 1 Device by Does not apply route 2 (two) times daily.   . polyethylene glycol powder (GLYCOLAX/MIRALAX) 17 GM/SCOOP powder Take 17-34 g by mouth daily as needed for mild constipation or moderate constipation.    No facility-administered encounter medications on file as of 09/01/2019.   Goals Addressed   Current Barriers:  . Financial Barriers . Knowledge deficits related to coordination of her own care . Limited social  support  Pharmacist Clinical Goal(s):  [name] Over the next 30 days, patient will demonstrate improved medication adherence as evidenced by verbalized understanding of prescribed medication regimen, assistance available, and patient report of adherence  Interventions: . Counsel on importance of medication adherence ? Counsel on using pill packaging as adherence tool ? Reports using maintenance inhaler (Trelegy) and rescue inhaler (albuterol inhaler) each as directed. [name] on importance of blood sugar control ? Reports taking: metformin ER 500 mg  twice daily as directed from pill pack ? Confirms STOPPED insulin glargine as directed by [name] ? Denies checking blood sugar over past week . Counsel on importance of continued blood pressure monitoring and control. ? Reports taking lisinopril 5 mg daily from pill pack ? Reports she thinks that she started this new dose on Saturday (start of new pack) ? Denies any missed doses ? Review blood pressure results from past 2 day Date AM BP PM BP   13 - December 151/?, HR 79*  *Unable to read her writing  14 - December 9am: 175/117, HR 85;  10am: 192/127, HR 96 145/97, HR 76* *Checks now  ? Ms. Bahar denies any headache, chest pain, shortness of breath, back pain, numbness/weakness, change in vision, or difficulty speaking with reading ?180/120 today ? Admits to drinking coffee today prior to 9 am reading and states has been "running around busy today" ? Patient admits difficulty with reading her own writing on log. Encourage to write legibly ? Counsel again on BP monitoring technique, including resting quietly >5 minutes before checking BP, not smoking, drinking caffeinated beverages or exercise within 30 minutes before measuring . Counsel Ms. Westervelt if reading ?180/120 of importance of checking blood pressure again after 5 minutes and if reading ?180/120, contact PCP. [name] Review importance of seeking [facility] if chest pain, shortness of breath, back pain, numbness/weakness, change in vision, or difficulty speaking with reading ?180/120 . Counsel on importance of limiting of sodium/salt in diet for blood pressure control . Follow up with patient regarding smoking cessation.  ? Reports currently smoking ~2 cigarettes/day (typically one first thing in morning and once at night ? Provide encouragement and discuss barriers . Coordination of care with Nurse Case Manager. RNCM plans to outreach to patient on 12/17 to follow up re: hypertension  Patient Self Care Activities:   . Patient takes medications as directed with aid of adherence tools. o Using pill packaging from [place] on phone set for 10 pm as reminder for insulin adminisration . Calls pharmacy for medication refills . Patient to attend scheduled medical appointments . Calls provider office for new concerns or questions  . Patient to check blood sugars twice daily and keep log . Patient to check blood pressure daily and keep log - Blood pressure goal: < 130/80   Please see past updates related to this goal by clicking on the "Past Updates" button in the selected goal       Plan  The care management team will reach out to the patient again over the next 7 days.   [name], PharmD, [place] [employer] [phone]

## 2019-09-01 NOTE — Patient Instructions (Signed)
Thank you allowing the Chronic Care Management Team to be a part of your care! It was a pleasure speaking with you today!     CCM (Chronic Care Management) Team    [name] RN, BSN Nurse Care Coordinator  [phone]   [name] PharmD  Clinical Pharmacist  [phone]   [name] LCSW Clinical Social Worker [phone]  Visit Information  The patient verbalized understanding of instructions provided today and declined a print copy of patient instruction materials.   The care management team will reach out to the patient again over the next 7 days.   [name], PharmD, [place] [employer] [phone]

## 2019-09-04 ENCOUNTER — Ambulatory Visit: Payer: Self-pay | Admitting: *Deleted

## 2019-09-04 DIAGNOSIS — Z794 Long term (current) use of insulin: Secondary | ICD-10-CM

## 2019-09-04 DIAGNOSIS — I10 Essential (primary) hypertension: Secondary | ICD-10-CM

## 2019-09-04 DIAGNOSIS — E118 Type 2 diabetes mellitus with unspecified complications: Secondary | ICD-10-CM | POA: Diagnosis not present

## 2019-09-04 NOTE — Chronic Care Management (AMB) (Signed)
Chronic Care Management   Follow Up Note   09/04/2019 Name: Kendra [name] MRN: [id number] DOB: 1946/01/21  Referred by: [name], NP (Inactive) Reason for referral : Chronic Care Management (HTN, DM)   Kendra [name] is a 73 y.o. year old female who is a primary care patient of [name], NP (Inactive). The CCM team was consulted for assistance with chronic disease management and care coordination needs.    Review of patient status, including review of consultants reports, relevant laboratory and other test results, and collaboration with appropriate care team members and the patient's provider was performed as part of comprehensive patient evaluation and provision of chronic care management services.    SDOH (Social Determinants of Health) screening performed today: Housing . See Care Plan for related entries.   Outpatient Encounter Medications as of 09/04/2019  Medication Sig Note  . acetaminophen (TYLENOL) 500 MG tablet Take 2 tablets (1,000 mg total) by mouth every 8 (eight) hours as needed for mild pain or moderate pain.   [name] albuterol (VENTOLIN HFA) 108 (90 Base) MCG/ACT inhaler INHALE 1 TO 2 PUFFS INTO THE LUNGS EVERY 6 HOURS AS NEEDED FOR WHEEZING OR SHORTNESS OF BREATH   . aspirin 81 MG EC tablet Take 1 tablet (81 mg total) by mouth daily. Swallow whole.   [name] atorvastatin (LIPITOR) 40 MG tablet TAKE 1 TABLET BY MOUTH ONCE DAILY   . baclofen (LIORESAL) 10 MG tablet TAKE 1 TABLET BY MOUTH ONCE DAILY AS NEEDED MUSCLE SPASMS   . bisacodyl (DULCOLAX) 5 MG EC tablet Take 5 mg by mouth daily as needed for moderate constipation. 01/30/2019: Reports using 1 tablet (5 mg)/day on 2 days in past week  . Blood Glucose Monitoring Suppl (ONETOUCH VERIO) w/Device KIT 1 kit by Does not apply route 2 (two) times daily.   [name] buPROPion (WELLBUTRIN XL) 150 MG 24 hr tablet TAKE 1 TABLET BY MOUTH ONCE DAILY   . Cholecalciferol (VITAMIN D3) 50 MCG (2000 UT) capsule Take 1  capsule (2,000 Units total) by mouth daily.   . Fluticasone-Umeclidin-Vilant (TRELEGY ELLIPTA) 100-62.5-25 MCG/INH AEPB Inhale 1 puff into the lungs daily.   [name] glucose blood (ONETOUCH VERIO) test strip Use as instructed   . Insulin Pen Needle (BD PEN NEEDLE NANO U/F) 32G X 4 MM MISC USE WITH LANTUS AS DIRECTED   . Lancet Devices (ONE TOUCH DELICA LANCING DEV) MISC 1 Device by Does not apply route 2 (two) times daily.   [name] lisinopril (ZESTRIL) 5 MG tablet Take 1 tablet (5 mg total) by mouth daily.   . meloxicam (MOBIC) 15 MG tablet TAKE 1 TABLET BY MOUTH ONCE DAILY AS NEEDED   . metFORMIN (GLUCOPHAGE-XR) 500 MG 24 hr tablet Take 1 tablet (500 mg total) by mouth 2 (two) times daily before a meal.   . nicotine polacrilex (COMMIT) 4 MG lozenge Take 4 mg by mouth as needed for smoking cessation.   [name] Lancets 88T MISC 1 Device by Does not apply route 2 (two) times daily.   . polyethylene glycol powder (GLYCOLAX/MIRALAX) 17 GM/SCOOP powder Take 17-34 g by mouth daily as needed for mild constipation or moderate constipation.    No facility-administered encounter medications on file as of 09/04/2019.     Goals Addressed            This Visit's Progress   . COMPLETED: RNCM-"I need some help with my sugar" (pt-stated)       Current Barriers:  [name] Knowledge  Deficits related to diabetes management  Nurse Case Manager Clinical Goal(s):  [name] Over the next 30 days, patient will verbalize understanding of plan for diabetes management . Over the next 30 days, patient will demonstrate improved health management independence as evidenced byperforming self health management activities such as checking cbg's/recording, working with CM team to address needs around chronic disease management  Interventions:  . Evaluation of current treatment plan related to diabetes management and patient's adherence to plan as established by provider. . Patient's A1c 6.0, restarted on Metformin XR . Patient stated  she is not checking blood sugars at this time  . Continues to work with CCM pharmacist with pill packaging  Patient Self Care Activities:  . Currently UNABLE TO independently self manage diabetes . Performs ADL's independently . Calls provider office for new concerns or questions  Please see past updates related to this goal by clicking on the "Past Updates" button in the selected goal      . RNCM-My headaches are worse (pt-stated)       Current Barriers:  [name] Knowledge Deficits related to basic understanding of hypertension pathophysiology and self care management . Knowledge Deficits related to understanding of medications prescribed for management of hypertension . Non-adherence to prescribed medication regimen . Film/video editor.   Case Manager Clinical Goal(s):  [name] Over the next 90 days, patient will demonstrate improved adherence to prescribed treatment plan for hypertension as evidenced by taking all medications as prescribed, monitoring and recording blood pressure as directed, adhering to low sodium/DASH diet  Interventions:  . Evaluation of current treatment plan related to hypertension self management and patient's adherence to plan as established by provider. . Discussed plans with patient for ongoing care management follow up and provided patient with direct contact information for care management team . Advised patient, providing education and rationale, to monitor blood pressure daily and record, calling PCP for findings outside established parameters.   . Patient denies headache but did state the right side of her neck has been sore and she is using Baclofen to assist with this . Patient continues to check b/p daily and is following pharmacy recommendations to recheck after 74mns if it is high.  Sep 02 2019 181/108 p77 After 331ms: 153/97 p86  Sep 03 2019 157/99 p 68   Sep 04 2019 139/95 p72     Patient Self Care Activities:  . UNABLE to independently manage  worsening headaches . Checks BP and records as discussed  Please see past updates related to this goal by clicking on the "Past Updates" button in the selected goal           The care management team will reach out to the patient again over the next 30 days.  The patient has been provided with contact information for the care management team and has been advised to call with any health related questions or concerns.   JaMerlene Morseinor RN, BSN Nurse Case MaPharmacist, [facility]  [phone] Mobile

## 2019-09-04 NOTE — Patient Instructions (Signed)
Thank you allowing the Chronic Care Management Team to be a part of your care! It was a pleasure speaking with you today!   CCM (Chronic Care Management) Team   Lavaughn Bisig RN, BSN Nurse Care Coordinator  [phone]  [name] PharmD  Clinical Pharmacist  [phone]  [name] LCSW Clinical Social Worker [phone]  Goals Addressed            This Visit's Progress   . COMPLETED: RNCM-"I need some help with my sugar" (pt-stated)       Current Barriers:  [name] Knowledge Deficits related to diabetes management  Nurse Case Manager Clinical Goal(s):  [name] Over the next 30 days, patient will verbalize understanding of plan for diabetes management . Over the next 30 days, patient will demonstrate improved health management independence as evidenced byperforming self health management activities such as checking cbg's/recording, working with CM team to address needs around chronic disease management  Interventions:  . Evaluation of current treatment plan related to diabetes management and patient's adherence to plan as established by provider. . Patient's A1c 6.0, restarted on Metformin XR . Patient stated she is not checking blood sugars at this time  . Continues to work with CCM pharmacist with pill packaging  Patient Self Care Activities:  . Currently UNABLE TO independently self manage diabetes . Performs ADL's independently . Calls provider office for new concerns or questions  Please see past updates related to this goal by clicking on the "Past Updates" button in the selected goal      . RNCM-My headaches are worse (pt-stated)       Current Barriers:  [name] Knowledge Deficits related to basic understanding of hypertension pathophysiology and self care management . Knowledge Deficits related to understanding of medications prescribed for management of hypertension . Non-adherence to prescribed medication regimen . Film/video editor.   Case Manager  Clinical Goal(s):  [name] Over the next 90 days, patient will demonstrate improved adherence to prescribed treatment plan for hypertension as evidenced by taking all medications as prescribed, monitoring and recording blood pressure as directed, adhering to low sodium/DASH diet  Interventions:  . Evaluation of current treatment plan related to hypertension self management and patient's adherence to plan as established by provider. . Discussed plans with patient for ongoing care management follow up and provided patient with direct contact information for care management team . Advised patient, providing education and rationale, to monitor blood pressure daily and record, calling PCP for findings outside established parameters.   . Patient denies headache but did state the right side of her neck has been sore and she is using Baclofen to assist with this . Patient continues to check b/p daily and is following pharmacy recommendations to recheck after 25mins if it is high.  Sep 02 2019 181/108 p77 After 44mins: 153/97 p86  Sep 03 2019 157/99 p 68   Sep 04 2019 139/95 p72     Patient Self Care Activities:  . UNABLE to independently manage worsening headaches . Checks BP and records as discussed  Please see past updates related to this goal by clicking on the "Past Updates" button in the selected goal          The patient verbalized understanding of instructions provided today and declined a print copy of patient instruction materials.   The patient has been provided with contact information for the care management team and has been advised to call with any health related questions or concerns.

## 2019-09-09 ENCOUNTER — Ambulatory Visit: Payer: Self-pay | Admitting: Pharmacist

## 2019-09-09 DIAGNOSIS — I10 Essential (primary) hypertension: Secondary | ICD-10-CM

## 2019-09-09 NOTE — Chronic Care Management (AMB) (Signed)
Chronic Care Management   Follow Up Note   09/09/2019 Name: Kendra [name] MRN: [id number] DOB: 02/21/1946  Referred by: [name], NP (Inactive) Reason for referral : Chronic Care Management (Patient Phone Call)   Kendra [name] is a 73 y.o. year old female who is a primary care patient of [name], NP (Inactive). The CCM team was consulted for assistance with chronic disease management and care coordination needs.  Kendra [name] has a medical history which includes but is not limited to recent CVA (11/2018), Type 2 Diabetes, hypertension, hyperlipidemia and COPD.  I reached out to Kendra [name] by phone today.   Review of patient status, including review of consultants reports, relevant laboratory and other test results, and collaboration with appropriate care team members and the patient's provider was performed as part of comprehensive patient evaluation and provision of chronic care management services.      Outpatient Encounter Medications as of 09/09/2019  Medication Sig Note  . acetaminophen (TYLENOL) 500 MG tablet Take 2 tablets (1,000 mg total) by mouth every 8 (eight) hours as needed for mild pain or moderate pain.   [name] albuterol (VENTOLIN HFA) 108 (90 Base) MCG/ACT inhaler INHALE 1 TO 2 PUFFS INTO THE LUNGS EVERY 6 HOURS AS NEEDED FOR WHEEZING OR SHORTNESS OF BREATH   . aspirin 81 MG EC tablet Take 1 tablet (81 mg total) by mouth daily. Swallow whole.   [name] atorvastatin (LIPITOR) 40 MG tablet TAKE 1 TABLET BY MOUTH ONCE DAILY   . baclofen (LIORESAL) 10 MG tablet TAKE 1 TABLET BY MOUTH ONCE DAILY AS NEEDED MUSCLE SPASMS   . bisacodyl (DULCOLAX) 5 MG EC tablet Take 5 mg by mouth daily as needed for moderate constipation. 01/30/2019: Reports using 1 tablet (5 mg)/day on 2 days in past week  . Blood Glucose Monitoring Suppl (ONETOUCH VERIO) w/Device KIT 1 kit by Does not apply route 2 (two) times daily.   [name] buPROPion (WELLBUTRIN XL) 150 MG 24 hr tablet  TAKE 1 TABLET BY MOUTH ONCE DAILY   . Cholecalciferol (VITAMIN D3) 50 MCG (2000 UT) capsule Take 1 capsule (2,000 Units total) by mouth daily.   . Fluticasone-Umeclidin-Vilant (TRELEGY ELLIPTA) 100-62.5-25 MCG/INH AEPB Inhale 1 puff into the lungs daily.   [name] glucose blood (ONETOUCH VERIO) test strip Use as instructed   . Insulin Pen Needle (BD PEN NEEDLE NANO U/F) 32G X 4 MM MISC USE WITH LANTUS AS DIRECTED   . Lancet Devices (ONE TOUCH DELICA LANCING DEV) MISC 1 Device by Does not apply route 2 (two) times daily.   [name] lisinopril (ZESTRIL) 5 MG tablet Take 1 tablet (5 mg total) by mouth daily.   . meloxicam (MOBIC) 15 MG tablet TAKE 1 TABLET BY MOUTH ONCE DAILY AS NEEDED   . metFORMIN (GLUCOPHAGE-XR) 500 MG 24 hr tablet Take 1 tablet (500 mg total) by mouth 2 (two) times daily before a meal.   . nicotine polacrilex (COMMIT) 4 MG lozenge Take 4 mg by mouth as needed for smoking cessation.   Kendra [name] Delica Lancets 79U MISC 1 Device by Does not apply route 2 (two) times daily.   . polyethylene glycol powder (GLYCOLAX/MIRALAX) 17 GM/SCOOP powder Take 17-34 g by mouth daily as needed for mild constipation or moderate constipation.    No facility-administered encounter medications on file as of 09/09/2019.    Goals Addressed   Current Barriers:  . Financial Barriers . Knowledge deficits related to coordination of her own care .  Limited social support  Pharmacist Clinical Goal(s):  [name] Over the next 30 days, patient will demonstrate improved medication adherence as evidenced by verbalized understanding of prescribed medication regimen, assistance available, and patient report of adherence  Interventions: . Follow up to patient regarding BP control/monitoring ? Patient reports has been feeling tired and has been staggering for the past 2 days. Reports both back and chest hurting ? Denies symptoms of numbness/weakness, change in speech, change in vision, facial symptoms ? Reports BP had been  "good" for the past days, but today reading is 196/93, HR 74; confirms taking medication from pill pack this morning ? Advise patient to call 911 for EMS. Kendra [name] understanding. Kendra [name] with CM Nurse Case Manager . Will collaborate with PCP  Patient Self Care Activities:  . Patient takes medications as directed with aid of adherence tools. o Using pill packaging from [facility] on phone set for 10 pm as reminder for insulin adminisration . Calls pharmacy for medication refills . Patient to attend scheduled medical appointments . Calls provider office for new concerns or questions  . Patient to check blood sugars twice daily and keep log . Patient to check blood pressure daily and keep log - Blood pressure goal: < 130/80   Please see past updates related to this goal by clicking on the "Past Updates" button in the selected goal       Plan  The care management team will reach out to the patient again over the next 7 days.   [name], PharmD, [place] [employer] [phone]

## 2019-09-09 NOTE — Patient Instructions (Signed)
Thank you allowing the Chronic Care Management Team to be a part of your care! It was a pleasure speaking with you today!     CCM (Chronic Care Management) Team    [name] RN, BSN Nurse Care Coordinator  [phone]   [name] PharmD  Clinical Pharmacist  ([place] LCSW Clinical Social Worker [phone]  Visit Information  Goals Addressed   Current Barriers:  . Financial Barriers . Knowledge deficits related to coordination of her own care . Limited social support  Pharmacist Clinical Goal(s):  [name] Over the next 30 days, patient will demonstrate improved medication adherence as evidenced by verbalized understanding of prescribed medication regimen, assistance available, and patient report of adherence  Interventions: . Follow up to patient regarding BP control/monitoring ? Patient reports has been feeling tired and has been staggering for the past 2 days. Reports both back and chest hurting ? Denies symptoms of numbness/weakness, change in speech, change in vision, facial symptoms ? Reports BP had been "good" for the past days, but today reading is 196/93, HR 74; confirms taking medication from pill pack this morning ? Advise patient to call 911 for EMS. Ms. dahiana ciccarelli understanding. Roney Marion with CM Nurse Case Manager . Will collaborate with PCP  Patient Self Care Activities:  . Patient takes medications as directed with aid of adherence tools. o Using pill packaging from [place] on phone set for 10 pm as reminder for insulin adminisration . Calls pharmacy for medication refills . Patient to attend scheduled medical appointments . Calls provider office for new concerns or questions  . Patient to check blood sugars twice daily and keep log . Patient to check blood pressure daily and keep log - Blood pressure goal: < 130/80   Please see past updates related to this goal by clicking on the "Past Updates"  button in the selected goal       The patient verbalized understanding of instructions provided today and declined a print copy of patient instruction materials.   The care management team will reach out to the patient again over the next 7 days.   [name], PharmD, [place] [employer] [phone]

## 2019-09-16 ENCOUNTER — Ambulatory Visit: Payer: Self-pay | Admitting: Pharmacist

## 2019-09-16 DIAGNOSIS — Z794 Long term (current) use of insulin: Secondary | ICD-10-CM

## 2019-09-16 DIAGNOSIS — J432 Centrilobular emphysema: Secondary | ICD-10-CM | POA: Diagnosis not present

## 2019-09-16 DIAGNOSIS — F17200 Nicotine dependence, unspecified, uncomplicated: Secondary | ICD-10-CM

## 2019-09-16 DIAGNOSIS — I10 Essential (primary) hypertension: Secondary | ICD-10-CM

## 2019-09-16 DIAGNOSIS — E118 Type 2 diabetes mellitus with unspecified complications: Secondary | ICD-10-CM | POA: Diagnosis not present

## 2019-09-16 NOTE — Chronic Care Management (AMB) (Signed)
Chronic Care Management   Follow Up Note   09/16/2019 Name: Kendra [name] MRN: [id number] DOB: 06-Jan-1946  Referred by: [name], NP (Inactive) Reason for referral : Chronic Care Management (Patient Phone Call)   [name] is a 73 y.o. year old female who is a primary care patient of [name], NP (Inactive). The CCM team was consulted for assistance with chronic disease management and care coordination needs.  [name] has a medical history which includes but is not limited to recent CVA (11/2018), Type 2 Diabetes, hypertension, hyperlipidemia and COPD.  I reached out to [name] by phone today.   Review of patient status, including review of consultants reports, relevant laboratory and other test results, and collaboration with appropriate care team members and the patient's provider was performed as part of comprehensive patient evaluation and provision of chronic care management services.     Outpatient Encounter Medications as of 09/16/2019  Medication Sig Note  . atorvastatin (LIPITOR) 40 MG tablet TAKE 1 TABLET BY MOUTH ONCE DAILY   . Fluticasone-Umeclidin-Vilant (TRELEGY ELLIPTA) 100-62.5-25 MCG/INH AEPB Inhale 1 puff into the lungs daily.   [name] lisinopril (ZESTRIL) 5 MG tablet Take 1 tablet (5 mg total) by mouth daily.   . metFORMIN (GLUCOPHAGE-XR) 500 MG 24 hr tablet Take 1 tablet (500 mg total) by mouth 2 (two) times daily before a meal.   . acetaminophen (TYLENOL) 500 MG tablet Take 2 tablets (1,000 mg total) by mouth every 8 (eight) hours as needed for mild pain or moderate pain.   [name] albuterol (VENTOLIN HFA) 108 (90 Base) MCG/ACT inhaler INHALE 1 TO 2 PUFFS INTO THE LUNGS EVERY 6 HOURS AS NEEDED FOR WHEEZING OR SHORTNESS OF BREATH   . aspirin 81 MG EC tablet Take 1 tablet (81 mg total) by mouth daily. Swallow whole.   . baclofen (LIORESAL) 10 MG tablet TAKE 1 TABLET BY MOUTH ONCE DAILY AS NEEDED MUSCLE SPASMS   . bisacodyl  (DULCOLAX) 5 MG EC tablet Take 5 mg by mouth daily as needed for moderate constipation. 01/30/2019: Reports using 1 tablet (5 mg)/day on 2 days in past week  . Blood Glucose Monitoring Suppl (ONETOUCH VERIO) w/Device KIT 1 kit by Does not apply route 2 (two) times daily.   [name] buPROPion (WELLBUTRIN XL) 150 MG 24 hr tablet TAKE 1 TABLET BY MOUTH ONCE DAILY   . Cholecalciferol (VITAMIN D3) 50 MCG (2000 UT) capsule Take 1 capsule (2,000 Units total) by mouth daily.   [name] glucose blood (ONETOUCH VERIO) test strip Use as instructed   . Insulin Pen Needle (BD PEN NEEDLE NANO U/F) 32G X 4 MM MISC USE WITH LANTUS AS DIRECTED   . Lancet Devices (ONE TOUCH DELICA LANCING DEV) MISC 1 Device by Does not apply route 2 (two) times daily.   . meloxicam (MOBIC) 15 MG tablet TAKE 1 TABLET BY MOUTH ONCE DAILY AS NEEDED   . nicotine polacrilex (COMMIT) 4 MG lozenge Take 4 mg by mouth as needed for smoking cessation.   [name] Lancets 09G MISC 1 Device by Does not apply route 2 (two) times daily.   . polyethylene glycol powder (GLYCOLAX/MIRALAX) 17 GM/SCOOP powder Take 17-34 g by mouth daily as needed for mild constipation or moderate constipation.    No facility-administered encounter medications on file as of 09/16/2019.    Goals Addressed            This Visit's Progress   . PharmD-Medication Adherence  Current Barriers:  . Financial Barriers . Knowledge deficits related to coordination of her own care . Limited social support  Pharmacist Clinical Goal(s):  [name] Over the next 30 days, patient will demonstrate improved medication adherence as evidenced by verbalized understanding of prescribed medication regimen, assistance available, and patient report of adherence  Interventions: . Follow up regarding blood pressure monitoring and control. o [name] denies having called EMS last week despite recommendation to do so based on reported home BP of 196/93 and complaint of staggering as well as  discomfort in chest and back.  Reports her staggering improved, and chest pain resolved, but has continued to have some pain in her back.  Encourage patient to schedule follow up with PCP office.  Patient calls and appointment scheduled for 1/4 o Reports taking lisinopril 5 mg daily from pill pack  Denies any missed doses o Review recent blood pressure results (see below) ? Counsel again on BP monitoring technique, including resting quietly >5 minutes before checking BP, not smoking, drinking caffeinated beverages or exercise within 30 minutes before measuring . Review importance of seeking Urgent [facility] Care if chest pain, shortness of breath, back pain, numbness/weakness, change in vision, or difficulty speaking with reading ?180/120 . Discuss impact of sodium/salt in diet for blood pressure control ? Patient reports she continues to limit sodium when preparing her meals and denies adding salt to her food ? Reports started to receive meals on wheels about 2 months ago (5 meals/week) . Follow up with patient regarding smoking cessation.  ? Reports has cut back on smoking to ~1 cigarette/day (typically in morning after checking BP) ? Provide encouragement and patient notes she can feel her breathing has improved as she has cut back . Collaborate with [employer] and [place] regarding area food resources ? [employer] unable to offer low-sodium meal option ? Care Guide notes [employer] as an option with more fresh food options ? Provide contact information for [employer] to patient . Follow up regarding blood sugar control ? Confirms taking metformin ER '500mg'$  twice daily as directed from pill packaging  Patient Self Care Activities:  . Patient takes medications as directed with aid of adherence tools. o Using pill packaging from [facility] . Calls pharmacy for medication refills . Patient to attend scheduled medical appointments . Calls provider office for  new concerns or questions  . Patient to check blood sugars twice daily and keep log . Patient to check blood pressure daily and keep log Date AM BP Notes  23 - December 176/100, HR 85   24 - December 140/108, HR 72   25 - December 165/117, HR 84 143/93, HR 81* *Repeated reading in 5 mins  26 - December 149/99, HR 84   27 - December 147/95, HR 88 155/109, HR 93* Rechecked after 1 hour  28 - December 140/84, HR 84   29 - December 152/83, HR 91  -  Blood pressure goal: < 130/80   Please see past updates related to this goal by clicking on the "Past Updates" button in the selected goal          Plan  The care management team will reach out to the patient again over the next 14 days.   [name], PharmD, [place] [employer] [phone]

## 2019-09-16 NOTE — Patient Instructions (Signed)
Thank you allowing the Chronic Care Management Team to be a part of your care! It was a pleasure speaking with you today!     CCM (Chronic Care Management) Team    [name] RN, BSN Nurse Care Coordinator  [phone]   [name] PharmD  Clinical Pharmacist  [phone]   [name] LCSW Clinical Social Worker [phone]  Visit Information  Goals Addressed            This Visit's Progress   . PharmD-Medication Adherence       Current Barriers:  . Financial Barriers . Knowledge deficits related to coordination of her own care . Limited social support  Pharmacist Clinical Goal(s):  [name] Over the next 30 days, patient will demonstrate improved medication adherence as evidenced by verbalized understanding of prescribed medication regimen, assistance available, and patient report of adherence  Interventions: . Follow up regarding blood pressure monitoring and control. o Kendra [name] denies having called EMS last week despite recommendation to do so based on reported home BP of 196/93 and complaint of staggering as well as discomfort in chest and back.  Reports her staggering improved, and chest pain resolved, but has continued to have some pain in her back.  Encourage patient to schedule follow up with PCP office.  Patient calls and appointment scheduled for 1/4 o Reports taking lisinopril 5 mg daily from pill pack  Denies any missed doses o Review recent blood pressure results (see below) ? Counsel again on BP monitoring technique, including resting quietly >5 minutes before checking BP, not smoking, drinking caffeinated beverages or exercise within 30 minutes before measuring . Review importance of seeking Urgent Medical Care if chest pain, shortness of breath, back pain, numbness/weakness, change in vision, or difficulty speaking with reading ?180/120 . Discuss impact of sodium/salt in diet for blood pressure control ? Patient reports she continues to  limit sodium when preparing her meals and denies adding salt to her food ? Reports started to receive meals on wheels about 2 months ago (5 meals/week) . Follow up with patient regarding smoking cessation.  ? Reports has cut back on smoking to ~1 cigarette/day (typically in morning after checking BP) ? Provide encouragement and patient notes she can feel her breathing has improved as she has cut back . Collaborate with [employer] and [place] regarding area food resources ? [employer] unable to offer low-sodium meal option ? Care Guide notes [employer] as an option with more fresh food options ? Provide contact information for [employer] to patient . Follow up regarding blood sugar control ? Confirms taking metformin ER 500mg  twice daily as directed from pill packaging  Patient Self Care Activities:  . Patient takes medications as directed with aid of adherence tools. o Using pill packaging from [facility] . Calls pharmacy for medication refills . Patient to attend scheduled medical appointments . Calls provider office for new concerns or questions  . Patient to check blood sugars twice daily and keep log . Patient to check blood pressure daily and keep log Date AM BP Notes  23 - December 176/100, HR 85   24 - December 140/108, HR 72   25 - December 165/117, HR 84 143/93, HR 81* *Repeated reading in 5 mins  26 - December 149/99, HR 84   27 - December 147/95, HR 88 155/109, HR 93* Rechecked after 1 hour  28 - December 140/84, HR 84   29 -  December 152/83, HR 91  -  Blood pressure goal: < 130/80   Please see past updates related to this goal by clicking on the "Past Updates" button in the selected goal          The patient verbalized understanding of instructions provided today and declined a print copy of patient instruction materials.   The care management team will reach out to the patient again over the next 14 days.   [name],  PharmD, [facility] [employer] [phone]

## 2019-09-17 ENCOUNTER — Telehealth: Payer: Self-pay

## 2019-09-18 ENCOUNTER — Ambulatory Visit: Payer: Self-pay | Admitting: Licensed Clinical Social Worker

## 2019-09-18 ENCOUNTER — Ambulatory Visit: Payer: Self-pay | Admitting: Pharmacist

## 2019-09-18 DIAGNOSIS — Z794 Long term (current) use of insulin: Secondary | ICD-10-CM

## 2019-09-18 DIAGNOSIS — I10 Essential (primary) hypertension: Secondary | ICD-10-CM | POA: Diagnosis not present

## 2019-09-18 DIAGNOSIS — E118 Type 2 diabetes mellitus with unspecified complications: Secondary | ICD-10-CM | POA: Diagnosis not present

## 2019-09-18 NOTE — Chronic Care Management (AMB) (Signed)
Chronic Care Management   Follow Up Note   09/18/2019 Name: Kendra [name] MRN: [id number] DOB: 1945/11/15  Referred by: [name], NP (Inactive) Reason for referral : Chronic Care Management (Patient Phone Call)   Kendra [name] is a 73 y.o. year old female who is a primary care patient of [name], NP (Inactive). The CCM team was consulted for assistance with chronic disease management and care coordination needs.  Kendra [name] has a medical history which includes but is not limited to recent CVA (11/2018), Type 2 Diabetes, hypertension, hyperlipidemia and COPD.  I reached out to Kendra [name] by phone today.   Review of patient status, including review of consultants reports, relevant laboratory and other test results, and collaboration with appropriate care team members and the patient's provider was performed as part of comprehensive patient evaluation and provision of chronic care management services.     Outpatient Encounter Medications as of 09/18/2019  Medication Sig Note  . acetaminophen (TYLENOL) 500 MG tablet Take 2 tablets (1,000 mg total) by mouth every 8 (eight) hours as needed for mild pain or moderate pain.   Kendra [name] albuterol (VENTOLIN HFA) 108 (90 Base) MCG/ACT inhaler INHALE 1 TO 2 PUFFS INTO THE LUNGS EVERY 6 HOURS AS NEEDED FOR WHEEZING OR SHORTNESS OF BREATH   . aspirin 81 MG EC tablet Take 1 tablet (81 mg total) by mouth daily. Swallow whole.   Kendra [name] atorvastatin (LIPITOR) 40 MG tablet TAKE 1 TABLET BY MOUTH ONCE DAILY   . baclofen (LIORESAL) 10 MG tablet TAKE 1 TABLET BY MOUTH ONCE DAILY AS NEEDED MUSCLE SPASMS   . bisacodyl (DULCOLAX) 5 MG EC tablet Take 5 mg by mouth daily as needed for moderate constipation. 01/30/2019: Reports using 1 tablet (5 mg)/day on 2 days in past week  . Blood Glucose Monitoring Suppl (ONETOUCH VERIO) w/Device KIT 1 kit by Does not apply route 2 (two) times daily.   Kendra [name] buPROPion (WELLBUTRIN XL) 150 MG 24 hr tablet  TAKE 1 TABLET BY MOUTH ONCE DAILY   . Cholecalciferol (VITAMIN D3) 50 MCG (2000 UT) capsule Take 1 capsule (2,000 Units total) by mouth daily.   . Fluticasone-Umeclidin-Vilant (TRELEGY ELLIPTA) 100-62.5-25 MCG/INH AEPB Inhale 1 puff into the lungs daily.   Kendra [name] glucose blood (ONETOUCH VERIO) test strip Use as instructed   . Insulin Pen Needle (BD PEN NEEDLE NANO U/F) 32G X 4 MM MISC USE WITH LANTUS AS DIRECTED   . Lancet Devices (ONE TOUCH DELICA LANCING DEV) MISC 1 Device by Does not apply route 2 (two) times daily.   Kendra [name] lisinopril (ZESTRIL) 5 MG tablet Take 1 tablet (5 mg total) by mouth daily.   . meloxicam (MOBIC) 15 MG tablet TAKE 1 TABLET BY MOUTH ONCE DAILY AS NEEDED   . metFORMIN (GLUCOPHAGE-XR) 500 MG 24 hr tablet Take 1 tablet (500 mg total) by mouth 2 (two) times daily before a meal.   . nicotine polacrilex (COMMIT) 4 MG lozenge Take 4 mg by mouth as needed for smoking cessation.   Kendra [name] Delica Lancets 18E MISC 1 Device by Does not apply route 2 (two) times daily.   . polyethylene glycol powder (GLYCOLAX/MIRALAX) 17 GM/SCOOP powder Take 17-34 g by mouth daily as needed for mild constipation or moderate constipation.    No facility-administered encounter medications on file as of 09/18/2019.    Goals Addressed            This Visit's Progress   . PharmD-Medication Adherence  Current Barriers:  . Financial Barriers . Knowledge deficits related to coordination of her own care . Limited social support  Pharmacist Clinical Goal(s):  Kendra [name] Over the next 30 days, patient will demonstrate improved medication adherence as evidenced by verbalized understanding of prescribed medication regimen, assistance available, and patient report of adherence  Interventions: . Receive message from [place], Jill Alexanders about an additional resource for meals delivery that Kendra [name] may qualify for, [employer] o Review website and note that this program offers low sodium as well as  diabetes friendly options . Collaborate with CM Social Worker regarding this resource and whether patient may qualify based on her current [facility] Dual Complete coverage. o CM Social Worker plans to follow up with patient, health plan and program next week . Follow up call to Kendra [name]  o Reports that she cut her finger earlier today. States that she cleaned it and applied pressure. States that it has stopped bleeding. o Patient denies needing to go to ED or Urgent Care.  o Advise patient to call PCP office for after hours nurse if needed for advice for cut and to contact emergency services if needed  Patient Self Care Activities:  . Patient takes medications as directed with aid of adherence tools. o Using pill packaging from [facility] . Calls pharmacy for medication refills . Patient to attend scheduled medical appointments . Calls provider office for new concerns or questions  . Patient to check blood sugars twice daily and keep log . Patient to check blood pressure daily and keep log - Blood pressure goal: < 130/80   Please see past updates related to this goal by clicking on the "Past Updates" button in the selected goal          Plan  Telephone follow up appointment with care management team member scheduled for: 1/11 at 2:30 pm  [name], PharmD, [place] [phone]

## 2019-09-18 NOTE — Patient Instructions (Signed)
Thank you allowing the Chronic Care Management Team to be a part of your care! It was a pleasure speaking with you today!     CCM (Chronic Care Management) Team    [name] RN, BSN Nurse Care Coordinator  [phone]   [name] PharmD  Clinical Pharmacist  [phone]   [name] LCSW Clinical Social Worker [phone]  Visit Information  Goals Addressed            This Visit's Progress   . PharmD-Medication Adherence       Current Barriers:  . Financial Barriers . Knowledge deficits related to coordination of her own care . Limited social support  Pharmacist Clinical Goal(s):  [name] Over the next 30 days, patient will demonstrate improved medication adherence as evidenced by verbalized understanding of prescribed medication regimen, assistance available, and patient report of adherence  Interventions: . Receive message from [place], Jill Alexanders about an additional resource for meals delivery that Ms. Azevedo may qualify for, [employer] o Review website and note that this program offers low sodium as well as diabetes friendly options . Collaborate with CM Social Worker regarding this resource and whether patient may qualify based on her current [facility] Dual Complete coverage. o CM Social Worker plans to follow up with patient, health plan and program next week . Follow up call to Ms. Guerrero  o Reports that she cut her finger earlier today. States that she cleaned it and applied pressure. States that it has stopped bleeding. o Patient denies needing to go to ED or Urgent Care.  o Advise patient to call PCP office for after hours nurse if needed for advice for cut and to contact emergency services if needed  Patient Self Care Activities:  . Patient takes medications as directed with aid of adherence tools. o Using pill packaging from [facility] . Calls pharmacy for medication refills . Patient to attend scheduled medical appointments . Calls  provider office for new concerns or questions  . Patient to check blood sugars twice daily and keep log . Patient to check blood pressure daily and keep log - Blood pressure goal: < 130/80   Please see past updates related to this goal by clicking on the "Past Updates" button in the selected goal          The patient verbalized understanding of instructions provided today and declined a print copy of patient instruction materials.   Telephone follow up appointment with care management team member scheduled for: 1/11 at 2:30 pm  [name], PharmD, [place] [phone]

## 2019-09-22 ENCOUNTER — Other Ambulatory Visit: Payer: Self-pay

## 2019-09-22 ENCOUNTER — Ambulatory Visit (INDEPENDENT_AMBULATORY_CARE_PROVIDER_SITE_OTHER): Payer: Medicare Other | Admitting: Family Medicine

## 2019-09-22 ENCOUNTER — Encounter: Payer: Self-pay | Admitting: Family Medicine

## 2019-09-22 VITALS — BP 143/91 | HR 85 | Ht 64.0 in | Wt 133.0 lb

## 2019-09-22 DIAGNOSIS — E1129 Type 2 diabetes mellitus with other diabetic kidney complication: Secondary | ICD-10-CM

## 2019-09-22 DIAGNOSIS — I10 Essential (primary) hypertension: Secondary | ICD-10-CM

## 2019-09-22 DIAGNOSIS — R809 Proteinuria, unspecified: Secondary | ICD-10-CM

## 2019-09-22 DIAGNOSIS — IMO0002 Reserved for concepts with insufficient information to code with codable children: Secondary | ICD-10-CM

## 2019-09-22 DIAGNOSIS — Z794 Long term (current) use of insulin: Secondary | ICD-10-CM

## 2019-09-22 DIAGNOSIS — E1165 Type 2 diabetes mellitus with hyperglycemia: Secondary | ICD-10-CM | POA: Diagnosis not present

## 2019-09-22 MED ORDER — LISINOPRIL 10 MG PO TABS: 10.0000 mg | ORAL_TABLET | Freq: Every day | ORAL | 3 refills | Status: DC

## 2019-09-22 NOTE — Progress Notes (Signed)
Virtual Visit via Telephone The purpose of this virtual visit is to provide medical care while limiting exposure to the novel coronavirus (COVID19) for both patient and office staff.  Consent was obtained for phone visit:  Yes.   Answered questions that patient had about telehealth interaction:  Yes.   I discussed the limittions, risks, security and privacy concerns of performing an evaluation and management service by telephone. I also discussed with the patient that there may be a patient responsible charge related to this service. The patient expressed understanding and agreed to proceed.  Patient Location: Home Provider Location: [facility])  ---------------------------------------------------------------------- Chief Complaint  Patient presents with  . Hypertension    S: Reviewed CMA documentation. I have called patient and gathered additional HPI as follows:  CHRONIC HTN: Last visit 08/27/19. She follows also with [name] [place] and has recent Bp readings recorded in chart on 09/16/19, ranging 140s-170 / 90-100s Current Meds - Lisinopril '5mg'$  daily   Reports good compliance, took meds today. Tolerating well, w/o complaints. Admits occasionally lightheaded dizzy Denies CP, dyspnea, HA, edema   Denies any high risk travel to areas of current concern for COVID19. Denies any known or suspected exposure to person with or possibly with COVID19.  Denies any fevers, chills, sweats, body ache, cough, shortness of breath, sinus pain or pressure, headache, abdominal pain, diarrhea  Past Medical History:  Diagnosis Date  . Back pain   . Cataract   . Cervical disc disorder   . Chest pain   . COPD (chronic obstructive pulmonary disease) ([place])   . Depression   . Diabetes ([place])   . Diabetic neuropathy ([place])   . GERD (gastroesophageal reflux disease)   . Hyperlipidemia   . Insomnia   . Neuropathy   . Osteoarthritis   . Osteopenia   .  Reflux   . Rheumatoid arthritis ([place])   . Sleep apnea    no CPAP and no suggestion that she needed one per pt  . Stroke ([place]) 2015   and again 2017  - Weakness in left leg, staggers w/ walking  . Tenosynovitis    Social History   Tobacco Use  . Smoking status: Current Some Day Smoker    Packs/day: 0.25    Types: Cigarettes  . Smokeless tobacco: Current User  Substance Use Topics  . Alcohol use: No  . Drug use: No    Current Outpatient Medications:  .  acetaminophen (TYLENOL) 500 MG tablet, Take 2 tablets (1,000 mg total) by mouth every 8 (eight) hours as needed for mild pain or moderate pain., Disp: 270 tablet, Rfl: 1 .  albuterol (VENTOLIN HFA) 108 (90 Base) MCG/ACT inhaler, INHALE 1 TO 2 PUFFS INTO THE LUNGS EVERY 6 HOURS AS NEEDED FOR WHEEZING OR SHORTNESS OF BREATH, Disp: 18 g, Rfl: 2 .  aspirin 81 MG EC tablet, Take 1 tablet (81 mg total) by mouth daily. Swallow whole., Disp: 90 tablet, Rfl: 3 .  atorvastatin (LIPITOR) 40 MG tablet, TAKE 1 TABLET BY MOUTH ONCE DAILY, Disp: 90 tablet, Rfl: 0 .  baclofen (LIORESAL) 10 MG tablet, TAKE 1 TABLET BY MOUTH ONCE DAILY AS NEEDED MUSCLE SPASMS, Disp: 90 each, Rfl: 1 .  bisacodyl (DULCOLAX) 5 MG EC tablet, Take 5 mg by mouth daily as needed for moderate constipation., Disp: , Rfl:  .  Blood Glucose Monitoring Suppl (ONETOUCH VERIO) w/Device KIT, 1 kit by Does not apply route 2 (two) times daily., Disp: 1 kit, Rfl:  0 .  buPROPion (WELLBUTRIN XL) 150 MG 24 hr tablet, TAKE 1 TABLET BY MOUTH ONCE DAILY, Disp: 90 tablet, Rfl: 0 .  Cholecalciferol (VITAMIN D3) 50 MCG (2000 UT) capsule, Take 1 capsule (2,000 Units total) by mouth daily., Disp: 90 capsule, Rfl: 1 .  Fluticasone-Umeclidin-Vilant (TRELEGY ELLIPTA) 100-62.5-25 MCG/INH AEPB, Inhale 1 puff into the lungs daily., Disp: 60 each, Rfl: 3 .  glucose blood (ONETOUCH VERIO) test strip, Use as instructed, Disp: 200 each, Rfl: 4 .  Insulin Pen Needle (BD PEN NEEDLE NANO U/F) 32G X 4 MM MISC, USE  WITH LANTUS AS DIRECTED, Disp: 90 each, Rfl: 3 .  Lancet Devices (ONE TOUCH DELICA LANCING DEV) MISC, 1 Device by Does not apply route 2 (two) times daily., Disp: 1 each, Rfl: 0 .  lisinopril (ZESTRIL) 10 MG tablet, Take 1 tablet (10 mg total) by mouth daily., Disp: 30 tablet, Rfl: 3 .  meloxicam (MOBIC) 15 MG tablet, TAKE 1 TABLET BY MOUTH ONCE DAILY AS NEEDED, Disp: 90 tablet, Rfl: 0 .  metFORMIN (GLUCOPHAGE-XR) 500 MG 24 hr tablet, Take 1 tablet (500 mg total) by mouth 2 (two) times daily before a meal., Disp: 60 tablet, Rfl: 3 .  nicotine polacrilex (COMMIT) 4 MG lozenge, Take 4 mg by mouth as needed for smoking cessation., Disp: , Rfl:  .  OneTouch Delica Lancets 23T MISC, 1 Device by Does not apply route 2 (two) times daily., Disp: 200 each, Rfl: 4 .  polyethylene glycol powder (GLYCOLAX/MIRALAX) 17 GM/SCOOP powder, Take 17-34 g by mouth daily as needed for mild constipation or moderate constipation., Disp: 255 g, Rfl: 1  Depression screen [facility] 2/9 09/22/2019 04/30/2019 03/05/2019  Decreased Interest '3 3 2  '$ Down, Depressed, Hopeless '3 2 2  '$ PHQ - 2 Score '6 5 4  '$ Altered sleeping 0 2 1  Tired, decreased energy 0 1 2  Change in appetite 0 0 1  Feeling bad or failure about yourself  0 0 2  Trouble concentrating 0 0 3  Moving slowly or fidgety/restless 0 1 2  Suicidal thoughts 0 0 1  PHQ-9 Score '6 9 16  '$ Difficult doing work/chores Not difficult at all Not difficult at all Not difficult at all  Some recent data might be hidden    GAD 7 : Generalized Anxiety Score 09/22/2019 04/30/2019 02/17/2019  Nervous, Anxious, on Edge 2 0 (No Data)  Control/stop worrying 3 0 -  Worry too much - different things 3 2 -  Trouble relaxing 0 0 -  Restless 2 1 -  Easily annoyed or irritable 3 3 -  Afraid - awful might happen 1 0 -  Total GAD 7 Score 14 6 -  Anxiety Difficulty Not difficult at all Not difficult at all -    -------------------------------------------------------------------------- O: No  physical exam performed due to remote telephone encounter.  Lab results reviewed.  Recent Results (from the past 2160 hour(s))  Hemoglobin A1C     Status: Abnormal   Collection Time: 08/20/19  8:02 AM  Result Value Ref Range   Hgb A1c MFr Bld 6.0 (H) <5.7 % of total Hgb    Comment: For someone without known diabetes, a hemoglobin  A1c value between 5.7% and 6.4% is consistent with prediabetes and should be confirmed with a  follow-up test. . For someone with known diabetes, a value <7% indicates that their diabetes is well controlled. A1c targets should be individualized based on duration of diabetes, age, comorbid conditions, and other considerations. . This  assay result is consistent with an increased risk of diabetes. . Currently, no consensus exists regarding use of hemoglobin A1c for diagnosis of diabetes for children. .    Mean Plasma Glucose 126 (calc)   eAG (mmol/L) 7.0 (calc)  Lipid panel     Status: None   Collection Time: 08/20/19  8:02 AM  Result Value Ref Range   Cholesterol 108 <200 mg/dL   HDL 50 > OR = 50 mg/dL   Triglycerides 70 <150 mg/dL   LDL Cholesterol (Calc) 43 mg/dL (calc)    Comment: Reference range: <100 . Desirable range <100 mg/dL for primary prevention;   <70 mg/dL for patients with CHD or diabetic patients  with > or = 2 CHD risk factors. [name] LDL-C is now calculated using the Martin-Hopkins  calculation, which is a validated novel method providing  better accuracy than the Friedewald equation in the  estimation of LDL-C.  Cresenciano Genre et al. Annamaria Helling. 0375;436(06): 2061-2068  (http://education.[facility].com/faq/FAQ164)    Total CHOL/HDL Ratio 2.2 <5.0 (calc)   Non-HDL Cholesterol (Calc) 58 <130 mg/dL (calc)    Comment: For patients with diabetes plus 1 major ASCVD risk  factor, treating to a non-HDL-C goal of <100 mg/dL  (LDL-C of <70 mg/dL) is considered a therapeutic  option.      -------------------------------------------------------------------------- A&P:  Problem List Items Addressed This Visit    Essential hypertension - Primary   Relevant Medications   lisinopril (ZESTRIL) 10 MG tablet    Other Visit Diagnoses    Microalbuminuria due to type 2 diabetes mellitus ([place])       See AP for DMT2   Relevant Medications   lisinopril (ZESTRIL) 10 MG tablet   Uncontrolled type 2 diabetes mellitus with insulin therapy ([place])       Relevant Medications   lisinopril (ZESTRIL) 10 MG tablet     Elevated BP home readings now uncontrolled Possible cause could be increased sodium intake w/ prepared meals on wheels and other dietary  Discussed case with [name] [place] given her involvement in this patient's BP monitoring recently.  Will agree to increase Lisinopril monotherapy from '5mg'$  up to '10mg'$  daily, [facility] rx [facility].  Meds ordered this encounter  Medications  . lisinopril (ZESTRIL) 10 MG tablet    Sig: Take 1 tablet (10 mg total) by mouth daily.    Dispense:  30 tablet    Refill:  3    May start increased dose with next pill packs.    Follow-up: - Return in 6 weeks to 3 months for HTN w/ new provider  Patient verbalizes understanding with the above medical recommendations including the limitation of remote medical advice.  Specific follow-up and call-back criteria were given for patient to follow-up or seek medical care more urgently if needed.   - Time spent in direct consultation with patient on phone: 9 minutes   Nobie Putnam, [facility] 09/22/2019, 1:54 PM
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
## 2019-09-23 ENCOUNTER — Ambulatory Visit (INDEPENDENT_AMBULATORY_CARE_PROVIDER_SITE_OTHER): Payer: Medicare Other | Admitting: Licensed Clinical Social Worker

## 2019-09-23 ENCOUNTER — Ambulatory Visit: Payer: Self-pay | Admitting: Pharmacist

## 2019-09-23 DIAGNOSIS — I10 Essential (primary) hypertension: Secondary | ICD-10-CM

## 2019-09-23 DIAGNOSIS — J432 Centrilobular emphysema: Secondary | ICD-10-CM

## 2019-09-23 DIAGNOSIS — J449 Chronic obstructive pulmonary disease, unspecified: Secondary | ICD-10-CM | POA: Diagnosis not present

## 2019-09-23 DIAGNOSIS — E118 Type 2 diabetes mellitus with unspecified complications: Secondary | ICD-10-CM

## 2019-09-23 DIAGNOSIS — E1169 Type 2 diabetes mellitus with other specified complication: Secondary | ICD-10-CM

## 2019-09-23 DIAGNOSIS — E785 Hyperlipidemia, unspecified: Secondary | ICD-10-CM

## 2019-09-23 DIAGNOSIS — E1165 Type 2 diabetes mellitus with hyperglycemia: Secondary | ICD-10-CM | POA: Diagnosis not present

## 2019-09-23 DIAGNOSIS — Z794 Long term (current) use of insulin: Secondary | ICD-10-CM

## 2019-09-23 DIAGNOSIS — IMO0002 Reserved for concepts with insufficient information to code with codable children: Secondary | ICD-10-CM

## 2019-09-23 DIAGNOSIS — E139 Other specified diabetes mellitus without complications: Secondary | ICD-10-CM

## 2019-09-23 NOTE — Patient Instructions (Addendum)
Thank you allowing the Chronic Care Management Team to be a part of your care! It was a pleasure speaking with you today!     CCM (Chronic Care Management) Team    [name] RN, BSN Nurse Care Coordinator  [phone]   [name] PharmD  Clinical Pharmacist  [phone]   [name] LCSW Clinical Social Worker [phone]  Visit Information  Goals Addressed            This Visit's Progress   . PharmD-Medication Adherence       Current Barriers:  . Financial Barriers . Knowledge deficits related to coordination of her own care . Limited social support  Pharmacist Clinical Goal(s):  [name] Over the next 30 days, patient will demonstrate improved medication adherence as evidenced by verbalized understanding of prescribed medication regimen, assistance available, and patient report of adherence  Interventions: . Collaborated with [name] regarding patient's blood pressure and medication management.  o [name] increased patient's lisinopril dose from 5 mg to 10 mg daily (new Rx sent to patient's pharmacy) on 1/5 . Coordination of care call with [facility] - Confirms new lisinopril dose, 10 mg once daily, to be included in upcoming pill pack and scheduled to be delivered to patient either this evening (1/5) or tomorrow (1/6).   Patient Self Care Activities:  . Patient takes medications as directed with aid of adherence tools. o Using pill packaging from [facility] . Calls pharmacy for medication refills . Patient to attend scheduled medical appointments . Calls provider office for new concerns or questions  . Patient to check blood sugars twice daily and keep log . Patient to check blood pressure daily and keep log - Blood pressure goal: < 130/80   Please see past updates related to this goal by clicking on the "Past Updates" button in the selected goal          The patient verbalized understanding of instructions provided  today and declined a print copy of patient instruction materials.   Telephone follow up appointment with care management team member scheduled for: 1/11  [name], PharmD, [place] [phone]
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
## 2019-09-23 NOTE — Chronic Care Management (AMB) (Signed)
Chronic Care Management   Follow Up Note   09/23/2019 Name: SAMI ROES MRN: [id number] DOB: [birth year]  Referred by: [name], NP (Inactive) Reason for referral : Care Coordination ([name])   ZENYA HICKAM is a 74 y.o. year old female who is a primary care patient of [name], NP (Inactive). The CCM team was consulted for assistance with chronic disease management and care coordination needs.  Ms. Hitz has a medical history which includes but is not limited to recent CVA (11/2018), Type 2 Diabetes, hypertension, hyperlipidemia and COPD.  Coordination of care call to [name].  Review of patient status, including review of consultants reports, relevant laboratory and other test results, and collaboration with appropriate care team members and the patient's provider was performed as part of comprehensive patient evaluation and provision of chronic care management services.     Outpatient Encounter Medications as of 09/23/2019  Medication Sig Note  . acetaminophen (TYLENOL) 500 MG tablet Take 2 tablets (1,000 mg total) by mouth every 8 (eight) hours as needed for mild pain or moderate pain.   [name] albuterol (VENTOLIN HFA) 108 (90 Base) MCG/ACT inhaler INHALE 1 TO 2 PUFFS INTO THE LUNGS EVERY 6 HOURS AS NEEDED FOR WHEEZING OR SHORTNESS OF BREATH   . aspirin 81 MG EC tablet Take 1 tablet (81 mg total) by mouth daily. Swallow whole.   [name] atorvastatin (LIPITOR) 40 MG tablet TAKE 1 TABLET BY MOUTH ONCE DAILY   . baclofen (LIORESAL) 10 MG tablet TAKE 1 TABLET BY MOUTH ONCE DAILY AS NEEDED MUSCLE SPASMS   . bisacodyl (DULCOLAX) 5 MG EC tablet Take 5 mg by mouth daily as needed for moderate constipation. 01/30/2019: Reports using 1 tablet (5 mg)/day on 2 days in past week  . Blood Glucose Monitoring Suppl (ONETOUCH VERIO) w/Device KIT 1 kit by Does not apply route 2 (two) times daily.   [name] buPROPion (WELLBUTRIN XL) 150 MG 24 hr tablet TAKE 1 TABLET BY MOUTH  ONCE DAILY   . Cholecalciferol (VITAMIN D3) 50 MCG (2000 UT) capsule Take 1 capsule (2,000 Units total) by mouth daily.   . Fluticasone-Umeclidin-Vilant (TRELEGY ELLIPTA) 100-62.5-25 MCG/INH AEPB Inhale 1 puff into the lungs daily.   [name] glucose blood (ONETOUCH VERIO) test strip Use as instructed   . Insulin Pen Needle (BD PEN NEEDLE NANO U/F) 32G X 4 MM MISC USE WITH LANTUS AS DIRECTED   . Lancet Devices (ONE TOUCH DELICA LANCING DEV) MISC 1 Device by Does not apply route 2 (two) times daily.   [name] lisinopril (ZESTRIL) 10 MG tablet Take 1 tablet (10 mg total) by mouth daily.   . meloxicam (MOBIC) 15 MG tablet TAKE 1 TABLET BY MOUTH ONCE DAILY AS NEEDED   . metFORMIN (GLUCOPHAGE-XR) 500 MG 24 hr tablet Take 1 tablet (500 mg total) by mouth 2 (two) times daily before a meal.   . nicotine polacrilex (COMMIT) 4 MG lozenge Take 4 mg by mouth as needed for smoking cessation.   [name] Lancets 76P MISC 1 Device by Does not apply route 2 (two) times daily.   . polyethylene glycol powder (GLYCOLAX/MIRALAX) 17 GM/SCOOP powder Take 17-34 g by mouth daily as needed for mild constipation or moderate constipation.    No facility-administered encounter medications on file as of 09/23/2019.    Goals Addressed            This Visit's Progress   . PharmD-Medication Adherence       Current  Barriers:  . Financial Barriers . Knowledge deficits related to coordination of her own care . Limited social support  Pharmacist Clinical Goal(s):  [name] Over the next 30 days, patient will demonstrate improved medication adherence as evidenced by verbalized understanding of prescribed medication regimen, assistance available, and patient report of adherence  Interventions: . Collaborated with [name] regarding patient's blood pressure and medication management.  o [name] increased patient's lisinopril dose from 5 mg to 10 mg daily (new Rx sent to patient's pharmacy) on 1/5 . Coordination of care  call with [facility] - Confirms new lisinopril dose, 10 mg once daily, to be included in upcoming pill pack and scheduled to be delivered to patient either this evening (1/5) or tomorrow (1/6).   Patient Self Care Activities:  . Patient takes medications as directed with aid of adherence tools. o Using pill packaging from [facility] . Calls pharmacy for medication refills . Patient to attend scheduled medical appointments . Calls provider office for new concerns or questions  . Patient to check blood sugars twice daily and keep log . Patient to check blood pressure daily and keep log - Blood pressure goal: < 130/80   Please see past updates related to this goal by clicking on the "Past Updates" button in the selected goal          Plan  Telephone follow up appointment with care management team member scheduled for: 1/11 at 2:30 pm  [name], PharmD, [place] [phone]

## 2019-09-25 ENCOUNTER — Telehealth: Payer: Self-pay

## 2019-09-29 ENCOUNTER — Ambulatory Visit: Payer: Self-pay | Admitting: Pharmacist

## 2019-09-29 ENCOUNTER — Telehealth: Payer: Self-pay

## 2019-09-29 NOTE — Chronic Care Management (AMB) (Signed)
  Chronic Care Management   Follow Up Note   09/29/2019 Name: Kendra [name] MRN: [id number] DOB: 03/03/1946  Referred by: [name], NP (Inactive) Reason for referral : Chronic Care Management (Patient Phone Call)   Kendra [name] is a 74 y.o. year old female who is a primary care patient of [name], NP (Inactive). The CCM team was consulted for assistance with chronic disease management and care coordination needs.    Was unable to reach patient via telephone today and have left HIPAA compliant voicemail asking patient to return my call.   Plan  The care management team will reach out to the patient again over the next 7 days.   [name], PharmD, [facility] [employer] [phone]

## 2019-09-30 ENCOUNTER — Ambulatory Visit: Payer: Self-pay | Admitting: Pharmacist

## 2019-09-30 DIAGNOSIS — J432 Centrilobular emphysema: Secondary | ICD-10-CM

## 2019-09-30 DIAGNOSIS — F17200 Nicotine dependence, unspecified, uncomplicated: Secondary | ICD-10-CM

## 2019-09-30 DIAGNOSIS — I10 Essential (primary) hypertension: Secondary | ICD-10-CM

## 2019-09-30 NOTE — Chronic Care Management (AMB) (Signed)
Chronic Care Management   Follow Up Note   09/30/2019 Name: Kendra [name] MRN: [id number] DOB: 1946-04-10  Referred by: Kendra College, NP (Inactive) Reason for referral : Chronic Care Management (Patient Phone Call)   Kendra [name] is a 74 y.o. year old female who is a primary care patient of Kendra College, NP (Inactive). The CCM team was consulted for assistance with chronic disease management and care coordination needs.    Receive phone call from Kendra [name] requesting a call back.  I reached out to [name] by phone today.   Review of patient status, including review of consultants reports, relevant laboratory and other test results, and collaboration with appropriate care team members and the patient's provider was performed as part of comprehensive patient evaluation and provision of chronic care management services.     Outpatient Encounter Medications as of 09/30/2019  Medication Sig Note  . albuterol (VENTOLIN HFA) 108 (90 Base) MCG/ACT inhaler INHALE 1 TO 2 PUFFS INTO THE LUNGS EVERY 6 HOURS AS NEEDED FOR WHEEZING OR SHORTNESS OF BREATH   . Fluticasone-Umeclidin-Vilant (TRELEGY ELLIPTA) 100-62.5-25 MCG/INH AEPB Inhale 1 puff into the lungs daily.   [name] lisinopril (ZESTRIL) 10 MG tablet Take 1 tablet (10 mg total) by mouth daily.   [name] acetaminophen (TYLENOL) 500 MG tablet Take 2 tablets (1,000 mg total) by mouth every 8 (eight) hours as needed for mild pain or moderate pain.   [name] aspirin 81 MG EC tablet Take 1 tablet (81 mg total) by mouth daily. Swallow whole.   [name] atorvastatin (LIPITOR) 40 MG tablet TAKE 1 TABLET BY MOUTH ONCE DAILY   . baclofen (LIORESAL) 10 MG tablet TAKE 1 TABLET BY MOUTH ONCE DAILY AS NEEDED MUSCLE SPASMS   . bisacodyl (DULCOLAX) 5 MG EC tablet Take 5 mg by mouth daily as needed for moderate constipation. 01/30/2019: Reports using 1 tablet (5 mg)/day on 2 days in past week  . Blood Glucose Monitoring Suppl (ONETOUCH VERIO)  w/Device KIT 1 kit by Does not apply route 2 (two) times daily.   [name] buPROPion (WELLBUTRIN XL) 150 MG 24 hr tablet TAKE 1 TABLET BY MOUTH ONCE DAILY   . Cholecalciferol (VITAMIN D3) 50 MCG (2000 UT) capsule Take 1 capsule (2,000 Units total) by mouth daily.   [name] glucose blood (ONETOUCH VERIO) test strip Use as instructed   . Insulin Pen Needle (BD PEN NEEDLE NANO U/F) 32G X 4 MM MISC USE WITH LANTUS AS DIRECTED   . Lancet Devices (ONE TOUCH DELICA LANCING DEV) MISC 1 Device by Does not apply route 2 (two) times daily.   . meloxicam (MOBIC) 15 MG tablet TAKE 1 TABLET BY MOUTH ONCE DAILY AS NEEDED   . metFORMIN (GLUCOPHAGE-XR) 500 MG 24 hr tablet Take 1 tablet (500 mg total) by mouth 2 (two) times daily before a meal.   . nicotine polacrilex (COMMIT) 4 MG lozenge Take 4 mg by mouth as needed for smoking cessation.   Kendra [name] Delica Lancets 75F MISC 1 Device by Does not apply route 2 (two) times daily.   . polyethylene glycol powder (GLYCOLAX/MIRALAX) 17 GM/SCOOP powder Take 17-34 g by mouth daily as needed for mild constipation or moderate constipation.    No facility-administered encounter medications on file as of 09/30/2019.    Goals Addressed            This Visit's Progress   . PharmD-Medication Adherence       Current Barriers:  . Financial Barriers .  Knowledge deficits related to coordination of her own care . Limited social support  Pharmacist Clinical Goal(s):  [name] Over the next 30 days, patient will demonstrate improved medication adherence as evidenced by verbalized understanding of prescribed medication regimen, assistance available, and patient report of adherence  Interventions: . Follow up regarding blood pressure monitoring and control. ? Reports taking new dose of lisinopril, 10 mg daily, from pill pack as directed ? Reports new dose started in last few days ? Review recent blood pressure results (see below) ? Patient only able to locate last 4 days of  readings ? Reports that she has been under more stress lately as she is trying to move out of her current home by the end of the month ? Counsel again on BP monitoring technique, including resting quietly >5 minutes before checking BP, not smoking, drinking caffeinated beverages or exercise within 30 minutes before measuring ? Review importance of seeking Urgent Medical Care if chest pain, shortness of breath, back pain, numbness/weakness, change in vision, or difficulty speaking with reading ?180/120 . Discuss impact of sodium/salt in diet for blood pressure control . Patient reports she continues to limit sodium when preparing her meals and denies adding salt to her food . Reports continues to receive meals on wheels (started about 2 months ago) and has noticed this food to be salty . Reports misplaced contact information previously provided for [employer] for more fresh food option.  ? Provide patient contact information again today . Follow up with patient regarding smoking cessation.  ? Reports currently smoking ~1 cigarette/day (typically in morning after checking BP) ? Reports continuing to use nicotine lozenges (~2 lozenges/day) as directed ? Provide encouragement and patient notes she can feel her breathing has improved as she has cut back . Follow up regarding blood sugar control ? Confirms taking metformin ER 545m twice daily as directed from pill packaging . Provide patient with contact information for CM Social Worker as requested to follow up about assistance with housing . Encourage patient to call PCP office as needed for new medical concerns  Patient Self Care Activities:  . Patient takes medications as directed with aid of adherence tools. o Using pill packaging from [facility] . Calls pharmacy for medication refills . Patient to attend scheduled medical appointments . Calls provider office for new concerns or questions  . Patient to check blood sugars twice daily  and keep log . Patient to check blood pressure daily and keep log Date AM BP Notes  9 - January 161/119, HR 101* *Reports upset at the time  10 - January 172/75, HR 88   11 - January 145/104, HR 75   12 - January 137/103, HR 77  -  Blood pressure goal: < 130/80   Please see past updates related to this goal by clicking on the "Past Updates" button in the selected goal          Plan  Telephone follow up appointment with care management team member scheduled for: 1/18  EHarlow Asa PharmD, [phone]

## 2019-09-30 NOTE — Patient Instructions (Signed)
Thank you allowing the Chronic Care Management Team to be a part of your care! It was a pleasure speaking with you today!     CCM (Chronic Care Management) Team    [name] RN, BSN Nurse Care Coordinator  [phone]   [name] PharmD  Clinical Pharmacist  [phone]   [name] LCSW Clinical Social Worker [phone]  Visit Information  Goals Addressed            This Visit's Progress   . PharmD-Medication Adherence       Current Barriers:  . Financial Barriers . Knowledge deficits related to coordination of her own care . Limited social support  Pharmacist Clinical Goal(s):  [name] Over the next 30 days, patient will demonstrate improved medication adherence as evidenced by verbalized understanding of prescribed medication regimen, assistance available, and patient report of adherence  Interventions: . Follow up regarding blood pressure monitoring and control. ? Reports taking new dose of lisinopril, 10 mg daily, from pill pack as directed ? Reports new dose started in last few days ? Review recent blood pressure results (see below) ? Patient only able to locate last 4 days of readings ? Reports that she has been under more stress lately as she is trying to move out of her current home by the end of the month ? Counsel again on BP monitoring technique, including resting quietly >5 minutes before checking BP, not smoking, drinking caffeinated beverages or exercise within 30 minutes before measuring ? Review importance of seeking Urgent Medical Care if chest pain, shortness of breath, back pain, numbness/weakness, change in vision, or difficulty speaking with reading ?180/120 . Discuss impact of sodium/salt in diet for blood pressure control . Patient reports she continues to limit sodium when preparing her meals and denies adding salt to her food . Reports continues to receive meals on wheels (started about 2 months ago) and has noticed this food to be  salty . Reports misplaced contact information previously provided for [employer] for more fresh food option.  ? Provide patient contact information again today . Follow up with patient regarding smoking cessation.  ? Reports currently smoking ~1 cigarette/day (typically in morning after checking BP) ? Reports continuing to use nicotine lozenges (~2 lozenges/day) as directed ? Provide encouragement and patient notes she can feel her breathing has improved as she has cut back . Follow up regarding blood sugar control ? Confirms taking metformin ER 500mg  twice daily as directed from pill packaging . Provide patient with contact information for CM Social Worker as requested to follow up about assistance with housing . Encourage patient to call PCP office as needed for new medical concerns  Patient Self Care Activities:  . Patient takes medications as directed with aid of adherence tools. o Using pill packaging from [facility] . Calls pharmacy for medication refills . Patient to attend scheduled medical appointments . Calls provider office for new concerns or questions  . Patient to check blood sugars twice daily and keep log . Patient to check blood pressure daily and keep log Date AM BP Notes  9 - January 161/119, HR 101* *Reports upset at the time  10 - January 172/75, HR 88   11 - January 145/104, HR 75   12 - January 137/103, HR 77  -  Blood pressure goal: < 130/80   Please see past updates related to this goal by clicking on the "Past Updates" button in the selected goal  The patient verbalized understanding of instructions provided today and declined a print copy of patient instruction materials.   Telephone follow up appointment with care management team member scheduled for: 1/18  [name], PharmD, [facility] [phone]

## 2019-10-06 ENCOUNTER — Ambulatory Visit: Payer: Self-pay | Admitting: Pharmacist

## 2019-10-06 DIAGNOSIS — E118 Type 2 diabetes mellitus with unspecified complications: Secondary | ICD-10-CM

## 2019-10-06 DIAGNOSIS — Z794 Long term (current) use of insulin: Secondary | ICD-10-CM

## 2019-10-06 DIAGNOSIS — I10 Essential (primary) hypertension: Secondary | ICD-10-CM

## 2019-10-06 DIAGNOSIS — F17200 Nicotine dependence, unspecified, uncomplicated: Secondary | ICD-10-CM

## 2019-10-06 NOTE — Patient Instructions (Signed)
Thank you allowing the Chronic Care Management Team to be a part of your care! It was a pleasure speaking with you today!     CCM (Chronic Care Management) Team    [name] RN, MSN, CCM Nurse Care Coordinator  [phone]   [name] PharmD  Clinical Pharmacist  [phone]   [name] LCSW Clinical Social Worker [phone]  Visit Information  Goals Addressed            This Visit's Progress   . PharmD-Medication Adherence       Current Barriers:  . Financial Barriers . Knowledge deficits related to coordination of her own care . Limited social support  Pharmacist Clinical Goal(s):  [name] Over the next 30 days, patient will demonstrate improved medication adherence as evidenced by verbalized understanding of prescribed medication regimen, assistance available, and patient report of adherence  Interventions: . Listen and provide emotional support as patient describes having been very upset over past days due to nephew being hospitalized and [place] for his health . Follow up regarding blood pressure monitoring and control. ? Reports taking lisinopril 10 mg daily as directed ? Denies any missed doses ? Review recent blood pressure results (see below) ? Note patient reports reading from morning of 1/15 taken when she was very upset about her nephew.  ? Note patient rechecked 30 minutes later  . Counsel again on BP monitoring technique, including resting quietly >5 minutes before checking BP; avoiding nicotine, drinking caffeinated beverages or exercise within 30 minutes before measuring ? Review importance of seeking Urgent Medical Care if chest pain, shortness of breath, back pain, numbness/weakness, change in vision, or difficulty speaking with reading ?180/120 . Discuss impact of sodium/salt in diet for blood pressure control ? Reports continues to receive meals on wheels (started about 2 months ago) and has noticed this food to be salty ? Patient reports  she continues to limit sodium when preparing her own meals and denies adding salt to her food ? Reports called and left message with [employer] to see about obtaining more fresh food options ? Review with patient hours for [employer] ? Ms. Arcand states she will call again tomorrow between 9-1 pm . Follow up with patient regarding smoking cessation.  ? Reports has decreased smoking to ~1/2 cigarette/day  ? Reports continuing to use nicotine lozenges (? 2 lozenges/day) as needed as directed ? Provide encouragement  ? Patient ready to set quit date: 11/02/2019 ? Encourage patient to let family know so that they can provide support as well . Follow up regarding blood sugar control ? Confirms taking metformin ER 500mg  twice daily as directed from pill packaging . Collaborate with CM Nurse Case Manager . Will collaborate with provider  Patient Self Care Activities:  . Patient takes medications as directed with aid of adherence tools. o Using pill packaging from [facility] . Calls pharmacy for medication refills . Patient to attend scheduled medical appointments . Calls provider office for new concerns or questions  . Patient to check blood sugars twice daily and keep log . Patient to check blood pressure daily and keep log Date AM BP PM BP Notes  13 - January 132/83, HR 72    14 - January  161/99, HR 70   15 - January 204/143, HR 105* 151/99, HR 78**  *Reports very upset at the time **Rechecked after 30 minutes  16 - January -    17 - January 159/95, HR 70  18 - January 148/97, HR 76   -  Blood pressure goal: < 130/80   Please see past updates related to this goal by clicking on the "Past Updates" button in the selected goal          The patient verbalized understanding of instructions provided today and declined a print copy of patient instruction materials.   Telephone follow up appointment with care management team member scheduled for: 1/28 at 2  pm  [name], PharmD, [place] [phone]

## 2019-10-06 NOTE — Chronic Care Management (AMB) (Signed)
Chronic Care Management   Follow Up Note   10/06/2019 Name: Kendra [name] MRN: [id number] DOB: [birth year]  Referred by: [name], NP (Inactive) Reason for referral : Chronic Care Management (Patient Phone Call)   Kendra [name] is a 74 y.o. year old female who is a primary care patient of [name], NP (Inactive). The CCM team was consulted for assistance with chronic disease management and care coordination needs. Kendra [name] has a medical history which includes but is not limited to recent CVA (11/2018), Type 2 Diabetes, hypertension, hyperlipidemia and COPD.   I reached out to Kendra [name] by phone today.   Review of patient status, including review of consultants reports, relevant laboratory and other test results, and collaboration with appropriate care team members and the patient's provider was performed as part of comprehensive patient evaluation and provision of chronic care management services.     Outpatient Encounter Medications as of 10/06/2019  Medication Sig Note  . lisinopril (ZESTRIL) 10 MG tablet Take 1 tablet (10 mg total) by mouth daily.   . metFORMIN (GLUCOPHAGE-XR) 500 MG 24 hr tablet Take 1 tablet (500 mg total) by mouth 2 (two) times daily before a meal.   . acetaminophen (TYLENOL) 500 MG tablet Take 2 tablets (1,000 mg total) by mouth every 8 (eight) hours as needed for mild pain or moderate pain.   [name] albuterol (VENTOLIN HFA) 108 (90 Base) MCG/ACT inhaler INHALE 1 TO 2 PUFFS INTO THE LUNGS EVERY 6 HOURS AS NEEDED FOR WHEEZING OR SHORTNESS OF BREATH   . aspirin 81 MG EC tablet Take 1 tablet (81 mg total) by mouth daily. Swallow whole.   [name] atorvastatin (LIPITOR) 40 MG tablet TAKE 1 TABLET BY MOUTH ONCE DAILY   . baclofen (LIORESAL) 10 MG tablet TAKE 1 TABLET BY MOUTH ONCE DAILY AS NEEDED MUSCLE SPASMS   . bisacodyl (DULCOLAX) 5 MG EC tablet Take 5 mg by mouth daily as needed for moderate constipation. 01/30/2019: Reports using 1  tablet (5 mg)/day on 2 days in past week  . Blood Glucose Monitoring Suppl (ONETOUCH VERIO) w/Device KIT 1 kit by Does not apply route 2 (two) times daily.   [name] buPROPion (WELLBUTRIN XL) 150 MG 24 hr tablet TAKE 1 TABLET BY MOUTH ONCE DAILY   . Cholecalciferol (VITAMIN D3) 50 MCG (2000 UT) capsule Take 1 capsule (2,000 Units total) by mouth daily.   . Fluticasone-Umeclidin-Vilant (TRELEGY ELLIPTA) 100-62.5-25 MCG/INH AEPB Inhale 1 puff into the lungs daily.   [name] glucose blood (ONETOUCH VERIO) test strip Use as instructed   . Insulin Pen Needle (BD PEN NEEDLE NANO U/F) 32G X 4 MM MISC USE WITH LANTUS AS DIRECTED   . Lancet Devices (ONE TOUCH DELICA LANCING DEV) MISC 1 Device by Does not apply route 2 (two) times daily.   . meloxicam (MOBIC) 15 MG tablet TAKE 1 TABLET BY MOUTH ONCE DAILY AS NEEDED   . nicotine polacrilex (COMMIT) 4 MG lozenge Take 4 mg by mouth as needed for smoking cessation.   [name] Lancets 78I MISC 1 Device by Does not apply route 2 (two) times daily.   . polyethylene glycol powder (GLYCOLAX/MIRALAX) 17 GM/SCOOP powder Take 17-34 g by mouth daily as needed for mild constipation or moderate constipation.    No facility-administered encounter medications on file as of 10/06/2019.    Goals Addressed            This Visit's Progress   . PharmD-Medication Adherence  Current Barriers:  . Financial Barriers . Knowledge deficits related to coordination of her own care . Limited social support  Pharmacist Clinical Goal(s):  [name] Over the next 30 days, patient will demonstrate improved medication adherence as evidenced by verbalized understanding of prescribed medication regimen, assistance available, and patient report of adherence  Interventions: . Listen and provide emotional support as patient describes having been very upset over past days due to nephew being hospitalized and [place] for his health . Follow up regarding blood pressure monitoring and  control. ? Reports taking lisinopril 10 mg daily as directed ? Denies any missed doses ? Review recent blood pressure results (see below) ? Note patient reports reading from morning of 1/15 taken when she was very upset about her nephew.  ? Note patient rechecked 30 minutes later  . Counsel again on BP monitoring technique, including resting quietly >5 minutes before checking BP; avoiding nicotine, drinking caffeinated beverages or exercise within 30 minutes before measuring ? Review importance of seeking Urgent Medical Care if chest pain, shortness of breath, back pain, numbness/weakness, change in vision, or difficulty speaking with reading ?180/120 . Discuss impact of sodium/salt in diet for blood pressure control ? Reports continues to receive meals on wheels (started about 2 months ago) and has noticed this food to be salty ? Patient reports she continues to limit sodium when preparing her own meals and denies adding salt to her food ? Reports called and left message with [employer] to see about obtaining more fresh food options ? Review with patient hours for [employer] ? Kendra [name] states she will call again tomorrow between 9-1 pm . Follow up with patient regarding smoking cessation.  ? Reports has decreased smoking to ~1/2 cigarette/day  ? Reports continuing to use nicotine lozenges (? 2 lozenges/day) as needed as directed ? Provide encouragement  ? Patient ready to set quit date: 11/02/2019 ? Encourage patient to let family know so that they can provide support as well . Follow up regarding blood sugar control ? Confirms taking metformin ER '500mg'$  twice daily as directed from pill packaging . Collaborate with CM Nurse Case Manager . Will collaborate with provider  Patient Self Care Activities:  . Patient takes medications as directed with aid of adherence tools. o Using pill packaging from [facility] . Calls pharmacy for medication refills . Patient to attend  scheduled medical appointments . Calls provider office for new concerns or questions  . Patient to check blood sugars twice daily and keep log . Patient to check blood pressure daily and keep log Date AM BP PM BP Notes  13 - January 132/83, HR 72    14 - January  161/99, HR 70   15 - January 204/143, HR 105* 151/99, HR 78**  *Reports very upset at the time **Rechecked after 30 minutes  16 - January -    17 - January 159/95, HR 70    18 - January 148/97, HR 76   -  Blood pressure goal: < 130/80   Please see past updates related to this goal by clicking on the "Past Updates" button in the selected goal          Plan  Telephone follow up appointment with care management team member scheduled for: 1/28 at 2 pm  [name], PharmD, [place] [phone]

## 2019-10-07 ENCOUNTER — Encounter: Payer: Self-pay | Admitting: Nurse Practitioner

## 2019-10-07 ENCOUNTER — Ambulatory Visit: Payer: Self-pay | Admitting: Pharmacist

## 2019-10-07 ENCOUNTER — Telehealth: Payer: Self-pay | Admitting: Nurse Practitioner

## 2019-10-07 DIAGNOSIS — I10 Essential (primary) hypertension: Secondary | ICD-10-CM

## 2019-10-07 NOTE — Telephone Encounter (Signed)
    Called pt regarding Kendra [name] Referral for information on food banks. Called [employer] on 3 way call with pt and left message with pt's phone # to call back regarding fresh vegetables (food bank closes at 1pm) so she should receive a phone call back tomorrow. Gave pt information on [place] at [employer] in [place] to find out if she can receive food directly from them. Will mail pt a list of other food pantries and [employer] meal calendar.   Shaniko . Embedded Care Coordination [place]  Care Management ??[email or web address]  ??[phone]

## 2019-10-07 NOTE — Chronic Care Management (AMB) (Signed)
Chronic Care Management   Follow Up Note   10/07/2019 Name: WESLIE RASMUS MRN: [id number] DOB: 09-04-1946  Referred by: [name], NP (Inactive) Reason for referral : No chief complaint on file.   NIYANNA ASCH is a 74 y.o. year old female who is a primary care patient of [name], NP (Inactive). The CCM team was consulted for assistance with chronic disease management and care coordination needs.  Ms. Sottile has a medical history which includes but is not limited to recent CVA (11/2018), Type 2 Diabetes, hypertension, hyperlipidemia and COPD.   Coordination of care call to [place].  Review of patient status, including review of consultants reports, relevant laboratory and other test results, and collaboration with appropriate care team members and the patient's provider was performed as part of comprehensive patient evaluation and provision of chronic care management services.     Outpatient Encounter Medications as of 10/07/2019  Medication Sig Note  . acetaminophen (TYLENOL) 500 MG tablet Take 2 tablets (1,000 mg total) by mouth every 8 (eight) hours as needed for mild pain or moderate pain.   [name] albuterol (VENTOLIN HFA) 108 (90 Base) MCG/ACT inhaler INHALE 1 TO 2 PUFFS INTO THE LUNGS EVERY 6 HOURS AS NEEDED FOR WHEEZING OR SHORTNESS OF BREATH   . aspirin 81 MG EC tablet Take 1 tablet (81 mg total) by mouth daily. Swallow whole.   [name] atorvastatin (LIPITOR) 40 MG tablet TAKE 1 TABLET BY MOUTH ONCE DAILY   . baclofen (LIORESAL) 10 MG tablet TAKE 1 TABLET BY MOUTH ONCE DAILY AS NEEDED MUSCLE SPASMS   . bisacodyl (DULCOLAX) 5 MG EC tablet Take 5 mg by mouth daily as needed for moderate constipation. 01/30/2019: Reports using 1 tablet (5 mg)/day on 2 days in past week  . Blood Glucose Monitoring Suppl (ONETOUCH VERIO) w/Device KIT 1 kit by Does not apply route 2 (two) times daily.   [name] buPROPion (WELLBUTRIN XL) 150 MG 24 hr tablet TAKE 1 TABLET BY  MOUTH ONCE DAILY   . Cholecalciferol (VITAMIN D3) 50 MCG (2000 UT) capsule Take 1 capsule (2,000 Units total) by mouth daily.   . Fluticasone-Umeclidin-Vilant (TRELEGY ELLIPTA) 100-62.5-25 MCG/INH AEPB Inhale 1 puff into the lungs daily.   [name] glucose blood (ONETOUCH VERIO) test strip Use as instructed   . Insulin Pen Needle (BD PEN NEEDLE NANO U/F) 32G X 4 MM MISC USE WITH LANTUS AS DIRECTED   . Lancet Devices (ONE TOUCH DELICA LANCING DEV) MISC 1 Device by Does not apply route 2 (two) times daily.   [name] lisinopril (ZESTRIL) 10 MG tablet Take 1 tablet (10 mg total) by mouth daily.   . meloxicam (MOBIC) 15 MG tablet TAKE 1 TABLET BY MOUTH ONCE DAILY AS NEEDED   . metFORMIN (GLUCOPHAGE-XR) 500 MG 24 hr tablet Take 1 tablet (500 mg total) by mouth 2 (two) times daily before a meal.   . nicotine polacrilex (COMMIT) 4 MG lozenge Take 4 mg by mouth as needed for smoking cessation.   [name] Lancets 40J MISC 1 Device by Does not apply route 2 (two) times daily.   . polyethylene glycol powder (GLYCOLAX/MIRALAX) 17 GM/SCOOP powder Take 17-34 g by mouth daily as needed for mild constipation or moderate constipation.    No facility-administered encounter medications on file as of 10/07/2019.    Goals Addressed            This Visit's Progress   . PharmD-Medication Adherence  Current Barriers:  . Financial Barriers . Knowledge deficits related to coordination of her own care . Limited social support  Pharmacist Clinical Goal(s):  [name] Over the next 30 days, patient will demonstrate improved medication adherence as evidenced by verbalized understanding of prescribed medication regimen, assistance available, and patient report of adherence  Interventions: . Place coordination of care call to [place] asking for her to assist patient with getting connected with [facility] [employer], as patient expressed difficulty with getting in contact. o Referral sent within this encounter      Patient Self Care Activities:  . Patient takes medications as directed with aid of adherence tools. o Using pill packaging from [facility] . Calls pharmacy for medication refills . Patient to attend scheduled medical appointments . Calls provider office for new concerns or questions  . Patient to check blood sugars twice daily and keep log . Patient to check blood pressure daily and keep log - Blood pressure goal: < 130/80   Please see past updates related to this goal by clicking on the "Past Updates" button in the selected goal          Plan  The care management team will reach out to the patient again over the next 14 days.   [name], PharmD, [place] [employer] [phone]

## 2019-10-09 ENCOUNTER — Ambulatory Visit: Payer: Self-pay | Admitting: General Practice

## 2019-10-09 ENCOUNTER — Telehealth: Payer: Self-pay

## 2019-10-09 DIAGNOSIS — I10 Essential (primary) hypertension: Secondary | ICD-10-CM

## 2019-10-09 DIAGNOSIS — J449 Chronic obstructive pulmonary disease, unspecified: Secondary | ICD-10-CM

## 2019-10-09 DIAGNOSIS — E1169 Type 2 diabetes mellitus with other specified complication: Secondary | ICD-10-CM

## 2019-10-09 DIAGNOSIS — E139 Other specified diabetes mellitus without complications: Secondary | ICD-10-CM

## 2019-10-09 DIAGNOSIS — E785 Hyperlipidemia, unspecified: Secondary | ICD-10-CM

## 2019-10-09 NOTE — Chronic Care Management (AMB) (Signed)
Chronic Care Management   Follow Up Note   10/09/2019 Name: Kendra [name] MRN: [id number] DOB: [birth year]  Referred by: [name], NP (Inactive) Reason for referral : Chronic Care Management (HTN/DM/HLD/COPD-food resources and needs)   Kendra [name] is a 74 y.o. year old female who is a primary care patient of Nobie Putnam, DO. The CCM team was consulted for assistance with chronic disease management and care coordination needs.    Review of patient status, including review of consultants reports, relevant laboratory and other test results, and collaboration with appropriate care team members and the patient's provider was performed as part of comprehensive patient evaluation and provision of chronic care management services.    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 See Care Plan for related entries.   Outpatient Encounter Medications as of 10/09/2019  Medication Sig Note   acetaminophen (TYLENOL) 500 MG tablet Take 2 tablets (1,000 mg total) by mouth every 8 (eight) hours as needed for mild pain or moderate pain.    albuterol (VENTOLIN HFA) 108 (90 Base) MCG/ACT inhaler INHALE 1 TO 2 PUFFS INTO THE LUNGS EVERY 6 HOURS AS NEEDED FOR WHEEZING OR SHORTNESS OF BREATH    aspirin 81 MG EC tablet Take 1 tablet (81 mg total) by mouth daily. Swallow whole.    atorvastatin (LIPITOR) 40 MG tablet TAKE 1 TABLET BY MOUTH ONCE DAILY    baclofen (LIORESAL) 10 MG tablet TAKE 1 TABLET BY MOUTH ONCE DAILY AS NEEDED MUSCLE SPASMS    bisacodyl (DULCOLAX) 5 MG EC tablet Take 5 mg by mouth daily as needed for moderate constipation. 01/30/2019: Reports using 1 tablet (5 mg)/day on 2 days in past week   Blood Glucose Monitoring Suppl (ONETOUCH VERIO) w/Device KIT 1 kit by Does not apply route 2 (two) times daily.    buPROPion (WELLBUTRIN XL) 150 MG 24 hr tablet TAKE 1 TABLET BY MOUTH ONCE DAILY    Cholecalciferol  (VITAMIN D3) 50 MCG (2000 UT) capsule Take 1 capsule (2,000 Units total) by mouth daily.    Fluticasone-Umeclidin-Vilant (TRELEGY ELLIPTA) 100-62.5-25 MCG/INH AEPB Inhale 1 puff into the lungs daily.    glucose blood (ONETOUCH VERIO) test strip Use as instructed    Insulin Pen Needle (BD PEN NEEDLE NANO U/F) 32G X 4 MM MISC USE WITH LANTUS AS DIRECTED    Lancet Devices (ONE TOUCH DELICA LANCING DEV) MISC 1 Device by Does not apply route 2 (two) times daily.    lisinopril (ZESTRIL) 10 MG tablet Take 1 tablet (10 mg total) by mouth daily.    meloxicam (MOBIC) 15 MG tablet TAKE 1 TABLET BY MOUTH ONCE DAILY AS NEEDED    metFORMIN (GLUCOPHAGE-XR) 500 MG 24 hr tablet Take 1 tablet (500 mg total) by mouth 2 (two) times daily before a meal.    nicotine polacrilex (COMMIT) 4 MG lozenge Take 4 mg by mouth as needed for smoking cessation.    OneTouch Delica Lancets 67T MISC 1 Device by Does not apply route 2 (two) times daily.    polyethylene glycol powder (GLYCOLAX/MIRALAX) 17 GM/SCOOP powder Take 17-34 g by mouth daily as needed for mild constipation or moderate constipation.    No facility-administered encounter medications on file as of 10/09/2019.     Objective:   BP Readings from Last 3 Encounters:  09/22/19 (!) 143/91  05/21/19 114/72  09/30/18 (!) 108/57    Goals Addressed  This Visit's Progress    RNCM-My headaches are worse, my blood pressure is up and down (pt-stated)       Current Barriers:   Knowledge Deficits related to basic understanding of hypertension pathophysiology and self care management  Knowledge Deficits related to understanding of medications prescribed for management of hypertension  Non-adherence to prescribed medication regimen  Limited ability to follow a low sodium diet  Financial Constraints.   Transportation barrier  Case Manager Clinical Goal(s):   Over the next 90 days, patient will demonstrate improved adherence to prescribed  treatment plan for hypertension as evidenced by taking all medications as prescribed, monitoring and recording blood pressure as directed, adhering to low sodium/DASH diet  Over the next 60 days the patient will utilize the [employer]it with the ability to prepare heart healthy/low sodium diet in home setting  Over the next 7 to 14 days the patient will have adequate transportation to her appointments  Interventions:   Evaluation of current treatment plan related to hypertension self management and patient's adherence to plan as established by provider.  Discussed plans with patient for ongoing care management follow up and provided patient with direct contact information for care management team  Advised patient, providing education and rationale, to monitor blood pressure daily and record, the patient stated the pcp told her to go to the ED for SBP>199  Advised patient to call [facility] for the "[employer] Benefit" ($55.00) credit per month for buying food   Patient denies headache today but verbalized she can tell when her blood pressure is elevated due to having a headache  Patient continues to check b/p daily and is following pharmacy recommendations to recheck after 47mns if it is high.   Oct 09 2019 144/97 p76    Sep 02 2019 181/108 p77 After 369ms: 153/97 p86  Sep 03 2019 157/99 p 68   Sep 04 2019 139/95 p72    Patient Self Care Activities:   UNABLE to independently manage worsening headaches  Checks BP and records as discussed  Please see past updates related to this goal by clicking on the "Past Updates" button in the selected goal        RNCM:I was exposed to [place]. What do I do       Current Barriers:   Knowledge Deficits related to COFingernd concern over possible exposure on 10/08/2019, in patient with COPD and other chronic health conditions  Knowledge Deficit related to how to obtain a COVID19 vaccination   Nurse Case Manager Clinical  Goal(s):   Over the next 30 days, [facility] address needs related to COMohrsvillexposure, presenting signs and symptoms, and vaccination information  (Patient currently has a dry cough, denies shortness of breath or fever)  Interventions:   Advised patient to call her pcp or seek emergent care for worsening signs and symptoms of COVID19  Provided education to patient re: COVID19 and signs and symptoms of COVID 19  Discussed plans with patient for ongoing care management follow up and provided patient with direct contact information for care management team  Provided patient with COAvalonducational materials related to her diagnosis of COPD and possible exposure to [place]  Advised the patient of community resources in the area for receiving COVID 19 vaccination   Patient Self Care Activities:   Calls provider office for new concerns or questions  Unable to independently manage Chronic medical conditions  Please see past updates related  to this goal by clicking on the "Past Updates" button in the selected goal          Plan:   Telephone follow up appointment with care management team member scheduled for: 11-17-2019 at 0900 am   [name] RN, MSN, [place] Mobile: [phone]

## 2019-10-09 NOTE — Patient Instructions (Signed)
Visit Information  Goals Addressed            This Visit's Progress   . RNCM-My headaches are worse, my blood pressure is up and down (pt-stated)       Current Barriers:  [name] Knowledge Deficits related to basic understanding of hypertension pathophysiology and self care management . Knowledge Deficits related to understanding of medications prescribed for management of hypertension . Non-adherence to prescribed medication regimen . Limited ability to follow a low sodium diet . Film/video editor.  . Transportation barrier  Case Manager Clinical Goal(s):  [name] Over the next 90 days, patient will demonstrate improved adherence to prescribed treatment plan for hypertension as evidenced by taking all medications as prescribed, monitoring and recording blood pressure as directed, adhering to low sodium/DASH diet . Over the next 60 days the patient will utilize the [employer]it with the ability to prepare heart healthy/low sodium diet in home setting . Over the next 7 to 14 days the patient will have adequate transportation to her appointments  Interventions:  . Evaluation of current treatment plan related to hypertension self management and patient's adherence to plan as established by provider. . Discussed plans with patient for ongoing care management follow up and provided patient with direct contact information for care management team . Advised patient, providing education and rationale, to monitor blood pressure daily and record, the patient stated the pcp told her to go to the ED for SBP>199 . Advised patient to call [name] for the "[employer] Benefit" ($55.00) credit per month for buying food  . Patient denies headache today but verbalized she can tell when her blood pressure is elevated due to having a headache . Patient continues to check b/p daily and is following pharmacy recommendations to recheck after 31mins if it is high.   Oct 09 2019 144/97 p76    Sep 02 2019  181/108 p77 After 90mins: 153/97 p86  Sep 03 2019 157/99 p 68   Sep 04 2019 139/95 p72    Patient Self Care Activities:  . UNABLE to independently manage worsening headaches . Checks BP and records as discussed  Please see past updates related to this goal by clicking on the "Past Updates" button in the selected goal       . RNCM:I was exposed to [place] yesterday. What do I do       Current Barriers:  [name] Knowledge Deficits related to [place] and concern over possible exposure on 10/08/2019, in patient with COPD and other chronic health conditions . Knowledge Deficit related to how to obtain a COVID19 vaccination   Nurse Case Manager Clinical Goal(s):  [name] Over the next 30 days, patient will work with [facility] and pcp to address needs related to [place] exposure, presenting signs and symptoms, and vaccination information  (Patient currently has a dry cough, denies shortness of breath or fever)  Interventions:  . Advised patient to call her pcp or seek emergent care for worsening signs and symptoms of COVID19 . Provided education to patient re: COVID19 and signs and symptoms of COVID 19 . Discussed plans with patient for ongoing care management follow up and provided patient with direct contact information for care management team . Provided patient with [place] educational materials related to her diagnosis of COPD and possible exposure to [place] recently . Advised the patient of community resources in the area for receiving COVID 19 vaccination   Patient Self Care Activities:  . Calls provider office for new concerns or  questions . Unable to independently manage Chronic medical conditions  Please see past updates related to this goal by clicking on the "Past Updates" button in the selected goal         The patient verbalized understanding of instructions provided today and declined a print copy of patient instruction materials.   Telephone follow up appointment with care management team  member scheduled for: 11-17-2019 at 0900 am  [name] RN, MSN, [facility] Mobile: [phone] COVID-19 COVID-19 is a respiratory infection that is caused by a virus called severe acute respiratory syndrome coronavirus 2 (SARS-CoV-2). The disease is also known as coronavirus disease or novel coronavirus. In some people, the virus may not cause any symptoms. In others, it may cause a serious infection. The infection can get worse quickly and can lead to complications, such as:  Pneumonia, or infection of the lungs.  Acute respiratory distress syndrome or ARDS. This is a condition in which fluid build-up in the lungs prevents the lungs from filling with air and passing oxygen into the blood.  Acute respiratory failure. This is a condition in which there is not enough oxygen passing from the lungs to the body or when carbon dioxide is not passing from the lungs out of the body.  Sepsis or septic shock. This is a serious bodily reaction to an infection.  Blood clotting problems.  Secondary infections due to bacteria or fungus.  Organ failure. This is when your body's organs stop working. The virus that causes COVID-19 is contagious. This means that it can spread from person to person through droplets from coughs and sneezes (respiratory secretions). What are the causes? This illness is caused by a virus. You may catch the virus by:  Breathing in droplets from an infected person. Droplets can be spread by a person breathing, speaking, singing, coughing, or sneezing.  Touching something, like a table or a doorknob, that was exposed to the virus (contaminated) and then touching your mouth, nose, or eyes. What increases the risk? Risk for infection You are more likely to be infected with this virus if you:  Are within 6 feet (2 meters) of a person with COVID-19.  Provide care for or live with a person who  is infected with COVID-19.  Spend time in crowded indoor spaces or live in shared housing. Risk for serious illness You are more likely to become seriously ill from the virus if you:  Are 82 years of age or older. The higher your age, the more you are at risk for serious illness.  Live in a nursing home or long-term care facility.  Have cancer.  Have a long-term (chronic) disease such as: ? Chronic lung disease, including chronic obstructive pulmonary disease or asthma. ? A long-term disease that lowers your body's ability to fight infection (immunocompromised). ? Heart disease, including heart failure, a condition in which the arteries that lead to the heart become narrow or blocked (coronary artery disease), a disease which makes the heart muscle thick, weak, or stiff (cardiomyopathy). ? Diabetes. ? Chronic kidney disease. ? Sickle cell disease, a condition in which red blood cells have an abnormal "sickle" shape. ? Liver disease.  Are obese. What are the signs or symptoms? Symptoms of this condition can range from mild to severe. Symptoms may appear any time from 2 to 14 days after being exposed to the virus. They include:  A fever or chills.  A cough.  Difficulty breathing.  Headaches, body aches, or muscle aches.  Runny or stuffy (congested) nose.  A sore throat.  New loss of taste or smell. Some people may also have stomach problems, such as nausea, vomiting, or diarrhea. Other people may not have any symptoms of COVID-19. How is this diagnosed? This condition may be diagnosed based on:  Your signs and symptoms, especially if: ? You live in an area with a COVID-19 outbreak. ? You recently traveled to or from an area where the virus is common. ? You provide care for or live with a person who was diagnosed with COVID-19. ? You were exposed to a person who was diagnosed with COVID-19.  A physical exam.  Lab tests, which may include: ? Taking a sample of fluid  from the back of your nose and throat (nasopharyngeal fluid), your nose, or your throat using a swab. ? A sample of mucus from your lungs (sputum). ? Blood tests.  Imaging tests, which may include, X-rays, CT scan, or ultrasound. How is this treated? At present, there is no medicine to treat COVID-19. Medicines that treat other diseases are being used on a trial basis to see if they are effective against COVID-19. Your health care provider will talk with you about ways to treat your symptoms. For most people, the infection is mild and can be managed at home with rest, fluids, and over-the-counter medicines. Treatment for a serious infection usually takes places in a hospital intensive care unit (ICU). It may include one or more of the following treatments. These treatments are given until your symptoms improve.  Receiving fluids and medicines through an IV.  Supplemental oxygen. Extra oxygen is given through a tube in the nose, a face mask, or a hood.  Positioning you to lie on your stomach (prone position). This makes it easier for oxygen to get into the lungs.  Continuous positive airway pressure (CPAP) or bi-level positive airway pressure (BPAP) machine. This treatment uses mild air pressure to keep the airways open. A tube that is connected to a motor delivers oxygen to the body.  Ventilator. This treatment moves air into and out of the lungs by using a tube that is placed in your windpipe.  Tracheostomy. This is a procedure to create a hole in the neck so that a breathing tube can be inserted.  Extracorporeal membrane oxygenation (ECMO). This procedure gives the lungs a chance to recover by taking over the functions of the heart and lungs. It supplies oxygen to the body and removes carbon dioxide. Follow these instructions at home: Lifestyle  If you are sick, stay home except to get medical care. Your health care provider will tell you how long to stay home. Call your health care  provider before you go for medical care.  Rest at home as told by your health care provider.  Do not use any products that contain nicotine or tobacco, such as cigarettes, e-cigarettes, and chewing tobacco. If you need help quitting, ask your health care provider.  Return to your normal activities as told by your health care provider. Ask your health care provider what activities are safe for you. General instructions  Take over-the-counter and prescription medicines only as told by your health care provider.  Drink enough fluid to keep your urine pale yellow.  Keep all follow-up visits as told by your health care provider. This is important. How is this prevented?  There is no vaccine to help prevent COVID-19 infection. However, there are steps  you can take to protect yourself and others from this virus. To protect yourself:   Do not travel to areas where COVID-19 is a risk. The areas where COVID-19 is reported change often. To identify high-risk areas and travel restrictions, check the CDC travel website: FatFares.com.br  If you live in, or must travel to, an area where COVID-19 is a risk, take precautions to avoid infection. ? Stay away from people who are sick. ? Wash your hands often with soap and water for 20 seconds. If soap and water are not available, use an alcohol-based hand sanitizer. ? Avoid touching your mouth, face, eyes, or nose. ? Avoid going out in public, follow guidance from your state and local health authorities. ? If you must go out in public, wear a cloth face covering or face mask. Make sure your mask covers your nose and mouth. ? Avoid crowded indoor spaces. Stay at least 6 feet (2 meters) away from others. ? Disinfect objects and surfaces that are frequently touched every day. This may include:  Counters and tables.  Doorknobs and light switches.  Sinks and faucets.  Electronics, such as phones, remote controls, keyboards, computers, and  tablets. To protect others: If you have symptoms of COVID-19, take steps to prevent the virus from spreading to others.  If you think you have a COVID-19 infection, contact your health care provider right away. Tell your health care team that you think you may have a COVID-19 infection.  Stay home. Leave your house only to seek medical care. Do not use public transport.  Do not travel while you are sick.  Wash your hands often with soap and water for 20 seconds. If soap and water are not available, use alcohol-based hand sanitizer.  Stay away from other members of your household. Let healthy household members care for children and pets, if possible. If you have to care for children or pets, wash your hands often and wear a mask. If possible, stay in your own room, separate from others. Use a different bathroom.  Make sure that all people in your household wash their hands well and often.  Cough or sneeze into a tissue or your sleeve or elbow. Do not cough or sneeze into your hand or into the air.  Wear a cloth face covering or face mask. Make sure your mask covers your nose and mouth. Where to find more information  Centers for Disease Control and Prevention: PurpleGadgets.be  World Health Organization: https://www.castaneda.info/ Contact a health care provider if:  You live in or have traveled to an area where COVID-19 is a risk and you have symptoms of the infection.  You have had contact with someone who has COVID-19 and you have symptoms of the infection. Get help right away if:  You have trouble breathing.  You have pain or pressure in your chest.  You have confusion.  You have bluish lips and fingernails.  You have difficulty waking from sleep.  You have symptoms that get worse. These symptoms may represent a serious problem that is an emergency. Do not wait to see if the symptoms will go away. Get medical help right away. Call  your local emergency services (911 in the [place] Do not drive yourself to the hospital. Let the emergency medical personnel know if you think you have COVID-19. Summary  COVID-19 is a respiratory infection that is caused by a virus. It is also known as coronavirus disease or novel coronavirus. It can cause serious infections, such as  pneumonia, acute respiratory distress syndrome, acute respiratory failure, or sepsis.  The virus that causes COVID-19 is contagious. This means that it can spread from person to person through droplets from breathing, speaking, singing, coughing, or sneezing.  You are more likely to develop a serious illness if you are 35 years of age or older, have a weak immune system, live in a nursing home, or have chronic disease.  There is no medicine to treat COVID-19. Your health care provider will talk with you about ways to treat your symptoms.  Take steps to protect yourself and others from infection. Wash your hands often and disinfect objects and surfaces that are frequently touched every day. Stay away from people who are sick and wear a mask if you are sick. This information is not intended to replace advice given to you by your health care provider. Make sure you discuss any questions you have with your health care provider. Document Revised: 07/04/2019 Document Reviewed: 10/10/2018 Elsevier Patient Education  [place].  Low-Sodium Eating Plan Sodium, which is an element that makes up salt, helps you maintain a healthy balance of fluids in your body. Too much sodium can increase your blood pressure and cause fluid and waste to be held in your body. Your health care provider or dietitian may recommend following this plan if you have high blood pressure (hypertension), kidney disease, liver disease, or heart failure. Eating less sodium can help lower your blood pressure, reduce swelling, and protect your heart, liver, and kidneys. What are tips for following  this plan? General guidelines Most people on this plan should limit their sodium intake to 1,500-2,000 mg (milligrams) of sodium each day. Reading food labels  The Nutrition Facts label lists the amount of sodium in one serving of the food. If you eat more than one serving, you must multiply the listed amount of sodium by the number of servings. Choose foods with less than 140 mg of sodium per serving. Avoid foods with 300 mg of sodium or more per serving. Shopping Look for lower-sodium products, often labeled as "low-sodium" or "no salt added." Always check the sodium content even if foods are labeled as "unsalted" or "no salt added". Buy fresh foods. Avoid canned foods and premade or frozen meals. Avoid canned, cured, or processed meats Buy breads that have less than 80 mg of sodium per slice. Cooking Eat more home-cooked food and less restaurant, buffet, and fast food. Avoid adding salt when cooking. Use salt-free seasonings or herbs instead of table salt or sea salt. Check with your health care provider or pharmacist before using salt substitutes. Cook with plant-based oils, such as canola, sunflower, or olive oil. Meal planning When eating at a restaurant, ask that your food be prepared with less salt or no salt, if possible. Avoid foods that contain MSG (monosodium glutamate). MSG is sometimes added to [place] food, bouillon, and some canned foods. What foods are recommended? The items listed may not be a complete list. Talk with your dietitian about what dietary choices are best for you. Grains Low-sodium cereals, including oats, puffed wheat and rice, and shredded wheat. Low-sodium crackers. Unsalted rice. Unsalted pasta. Low-sodium bread. Whole-grain breads and whole-grain pasta. Vegetables Fresh or frozen vegetables. "No salt added" canned vegetables. "No salt added" tomato sauce and paste. Low-sodium or reduced-sodium tomato and vegetable juice. Fruits Fresh, frozen, or canned  fruit. Fruit juice. Meats and other protein foods Fresh or frozen (no salt added) meat, poultry, seafood, and fish. Low-sodium canned  tuna and salmon. Unsalted nuts. Dried peas, beans, and lentils without added salt. Unsalted canned beans. Eggs. Unsalted nut butters. Dairy Milk. Soy milk. Cheese that is naturally low in sodium, such as ricotta cheese, fresh mozzarella, or Swiss cheese Low-sodium or reduced-sodium cheese. Cream cheese. Yogurt. Fats and oils Unsalted butter. Unsalted margarine with no trans fat. Vegetable oils such as canola or olive oils. Seasonings and other foods Fresh and dried herbs and spices. Salt-free seasonings. Low-sodium mustard and ketchup. Sodium-free salad dressing. Sodium-free light mayonnaise. Fresh or refrigerated horseradish. Lemon juice. Vinegar. Homemade, reduced-sodium, or low-sodium soups. Unsalted popcorn and pretzels. Low-salt or salt-free chips. What foods are not recommended? The items listed may not be a complete list. Talk with your dietitian about what dietary choices are best for you. Grains Instant hot cereals. Bread stuffing, pancake, and biscuit mixes. Croutons. Seasoned rice or pasta mixes. Noodle soup cups. Boxed or frozen macaroni and cheese. Regular salted crackers. Self-rising flour. Vegetables Sauerkraut, pickled vegetables, and relishes. Olives. [place] fries. Onion rings. Regular canned vegetables (not low-sodium or reduced-sodium). Regular canned tomato sauce and paste (not low-sodium or reduced-sodium). Regular tomato and vegetable juice (not low-sodium or reduced-sodium). Frozen vegetables in sauces. Meats and other protein foods Meat or fish that is salted, canned, smoked, spiced, or pickled. Bacon, ham, sausage, hotdogs, corned beef, chipped beef, packaged lunch meats, salt pork, jerky, pickled herring, anchovies, regular canned tuna, sardines, salted nuts. Dairy Processed cheese and cheese spreads. Cheese curds. Blue cheese. Feta cheese.  String cheese. Regular cottage cheese. Buttermilk. Canned milk. Fats and oils Salted butter. Regular margarine. Ghee. Bacon fat. Seasonings and other foods Onion salt, garlic salt, seasoned salt, table salt, and sea salt. Canned and packaged gravies. Worcestershire sauce. Tartar sauce. Barbecue sauce. Teriyaki sauce. Soy sauce, including reduced-sodium. Steak sauce. Fish sauce. Oyster sauce. Cocktail sauce. Horseradish that you find on the shelf. Regular ketchup and mustard. Meat flavorings and tenderizers. Bouillon cubes. Hot sauce and Tabasco sauce. Premade or packaged marinades. Premade or packaged taco seasonings. Relishes. Regular salad dressings. Salsa. Potato and tortilla chips. Corn chips and puffs. Salted popcorn and pretzels. Canned or dried soups. Pizza. Frozen entrees and pot pies. Summary Eating less sodium can help lower your blood pressure, reduce swelling, and protect your heart, liver, and kidneys. Most people on this plan should limit their sodium intake to 1,500-2,000 mg (milligrams) of sodium each day. Canned, boxed, and frozen foods are high in sodium. Restaurant foods, fast foods, and pizza are also very high in sodium. You also get sodium by adding salt to food. Try to cook at home, eat more fresh fruits and vegetables, and eat less fast food, canned, processed, or prepared foods. This information is not intended to replace advice given to you by your health care provider. Make sure you discuss any questions you have with your health care provider. Document Revised: 08/17/2017 Document Reviewed: 08/28/2016 Elsevier Patient Education  2020 [employer].

## 2019-10-16 ENCOUNTER — Other Ambulatory Visit: Payer: Self-pay

## 2019-10-16 ENCOUNTER — Ambulatory Visit: Payer: Self-pay | Admitting: Pharmacist

## 2019-10-16 ENCOUNTER — Emergency Department: Payer: Medicare Other

## 2019-10-16 ENCOUNTER — Inpatient Hospital Stay
Admission: EM | Admit: 2019-10-16 | Discharge: 2019-10-17 | DRG: 057 | Disposition: A | Discharge status: Other Acute Inpatient Hospital | Payer: Medicare Other | Attending: Family Medicine | Admitting: Family Medicine

## 2019-10-16 ENCOUNTER — Encounter: Payer: Self-pay | Admitting: Emergency Medicine

## 2019-10-16 DIAGNOSIS — Z833 Family history of diabetes mellitus: Secondary | ICD-10-CM | POA: Diagnosis not present

## 2019-10-16 DIAGNOSIS — Z794 Long term (current) use of insulin: Secondary | ICD-10-CM | POA: Diagnosis not present

## 2019-10-16 DIAGNOSIS — Z7982 Long term (current) use of aspirin: Secondary | ICD-10-CM | POA: Diagnosis not present

## 2019-10-16 DIAGNOSIS — E1142 Type 2 diabetes mellitus with diabetic polyneuropathy: Secondary | ICD-10-CM | POA: Diagnosis present

## 2019-10-16 DIAGNOSIS — R519 Headache, unspecified: Secondary | ICD-10-CM | POA: Diagnosis present

## 2019-10-16 DIAGNOSIS — J432 Centrilobular emphysema: Secondary | ICD-10-CM | POA: Diagnosis not present

## 2019-10-16 DIAGNOSIS — Z20822 Contact with and (suspected) exposure to covid-19: Secondary | ICD-10-CM | POA: Diagnosis present

## 2019-10-16 DIAGNOSIS — R29898 Other symptoms and signs involving the musculoskeletal system: Secondary | ICD-10-CM | POA: Diagnosis present

## 2019-10-16 DIAGNOSIS — E785 Hyperlipidemia, unspecified: Secondary | ICD-10-CM | POA: Diagnosis present

## 2019-10-16 DIAGNOSIS — R531 Weakness: Secondary | ICD-10-CM

## 2019-10-16 DIAGNOSIS — I10 Essential (primary) hypertension: Secondary | ICD-10-CM | POA: Diagnosis present

## 2019-10-16 DIAGNOSIS — F17210 Nicotine dependence, cigarettes, uncomplicated: Secondary | ICD-10-CM | POA: Diagnosis present

## 2019-10-16 DIAGNOSIS — E1136 Type 2 diabetes mellitus with diabetic cataract: Secondary | ICD-10-CM | POA: Diagnosis present

## 2019-10-16 DIAGNOSIS — Z823 Family history of stroke: Secondary | ICD-10-CM

## 2019-10-16 DIAGNOSIS — E1165 Type 2 diabetes mellitus with hyperglycemia: Secondary | ICD-10-CM | POA: Diagnosis not present

## 2019-10-16 DIAGNOSIS — E118 Type 2 diabetes mellitus with unspecified complications: Secondary | ICD-10-CM | POA: Diagnosis present

## 2019-10-16 DIAGNOSIS — I69354 Hemiplegia and hemiparesis following cerebral infarction affecting left non-dominant side: Secondary | ICD-10-CM | POA: Diagnosis not present

## 2019-10-16 DIAGNOSIS — E1159 Type 2 diabetes mellitus with other circulatory complications: Secondary | ICD-10-CM | POA: Diagnosis present

## 2019-10-16 DIAGNOSIS — J449 Chronic obstructive pulmonary disease, unspecified: Secondary | ICD-10-CM | POA: Diagnosis present

## 2019-10-16 DIAGNOSIS — F17200 Nicotine dependence, unspecified, uncomplicated: Secondary | ICD-10-CM

## 2019-10-16 DIAGNOSIS — I639 Cerebral infarction, unspecified: Secondary | ICD-10-CM

## 2019-10-16 DIAGNOSIS — Z79899 Other long term (current) drug therapy: Secondary | ICD-10-CM | POA: Diagnosis not present

## 2019-10-16 DIAGNOSIS — R29702 NIHSS score 2: Secondary | ICD-10-CM | POA: Diagnosis present

## 2019-10-16 DIAGNOSIS — E1169 Type 2 diabetes mellitus with other specified complication: Secondary | ICD-10-CM | POA: Diagnosis present

## 2019-10-16 DIAGNOSIS — K219 Gastro-esophageal reflux disease without esophagitis: Secondary | ICD-10-CM | POA: Diagnosis present

## 2019-10-16 DIAGNOSIS — M069 Rheumatoid arthritis, unspecified: Secondary | ICD-10-CM | POA: Diagnosis present

## 2019-10-16 DIAGNOSIS — I152 Hypertension secondary to endocrine disorders: Secondary | ICD-10-CM | POA: Diagnosis present

## 2019-10-16 DIAGNOSIS — I6930 Unspecified sequelae of cerebral infarction: Secondary | ICD-10-CM

## 2019-10-16 LAB — CBC
HCT: 35.2 % — ABNORMAL LOW (ref 36.0–46.0)
Hemoglobin: 11.4 g/dL — ABNORMAL LOW (ref 12.0–15.0)
MCH: 30.4 pg (ref 26.0–34.0)
MCHC: 32.4 g/dL (ref 30.0–36.0)
MCV: 93.9 fL (ref 80.0–100.0)
Platelets: 230 10*3/uL (ref 150–400)
RBC: 3.75 MIL/uL — ABNORMAL LOW (ref 3.87–5.11)
RDW: 13.3 % (ref 11.5–15.5)
WBC: 6.3 10*3/uL (ref 4.0–10.5)
nRBC: 0 % (ref 0.0–0.2)

## 2019-10-16 LAB — COMPREHENSIVE METABOLIC PANEL
ALT: 26 U/L (ref 0–44)
AST: 28 U/L (ref 15–41)
Albumin: 4 g/dL (ref 3.5–5.0)
Alkaline Phosphatase: 65 U/L (ref 38–126)
Anion gap: 10 (ref 5–15)
BUN: 14 mg/dL (ref 8–23)
CO2: 25 mmol/L (ref 22–32)
Calcium: 9.6 mg/dL (ref 8.9–10.3)
Chloride: 106 mmol/L (ref 98–111)
Creatinine, Ser: 0.78 mg/dL (ref 0.44–1.00)
GFR calc Af Amer: >60 mL/min (ref >60)
GFR calc non Af Amer: >60 mL/min (ref >60)
Glucose, Bld: 102 mg/dL — ABNORMAL HIGH (ref 70–99)
Potassium: 3.7 mmol/L (ref 3.5–5.1)
Sodium: 141 mmol/L (ref 135–145)
Total Bilirubin: 0.6 mg/dL (ref 0.3–1.2)
Total Protein: 7.2 g/dL (ref 6.5–8.1)

## 2019-10-16 LAB — PROTIME-INR
INR: 1.1 (ref 0.8–1.2)
Prothrombin Time: 13.9 seconds (ref 11.4–15.2)

## 2019-10-16 LAB — APTT: aPTT: 30 seconds (ref 24–36)

## 2019-10-16 LAB — DIFFERENTIAL
Abs Immature Granulocytes: 0.01 10*3/uL (ref 0.00–0.07)
Basophils Absolute: 0 10*3/uL (ref 0.0–0.1)
Basophils Relative: 1 %
Eosinophils Absolute: 0.3 10*3/uL (ref 0.0–0.5)
Eosinophils Relative: 5 %
Immature Granulocytes: 0 %
Lymphocytes Relative: 33 %
Lymphs Abs: 2.1 10*3/uL (ref 0.7–4.0)
Monocytes Absolute: 0.3 10*3/uL (ref 0.1–1.0)
Monocytes Relative: 5 %
Neutro Abs: 3.6 10*3/uL (ref 1.7–7.7)
Neutrophils Relative %: 56 %

## 2019-10-16 LAB — RESPIRATORY PANEL BY RT PCR (FLU A&B, COVID)
Influenza A by PCR: NEGATIVE
Influenza B by PCR: NEGATIVE
SARS Coronavirus 2 by RT PCR: NEGATIVE

## 2019-10-16 LAB — GLUCOSE, CAPILLARY: Glucose-Capillary: 95 mg/dL (ref 70–99)

## 2019-10-16 MED ORDER — STROKE: EARLY STAGES OF RECOVERY BOOK: Freq: Once | Status: AC

## 2019-10-16 MED ORDER — ASPIRIN 325 MG PO TABS
325.0000 mg | ORAL_TABLET | Freq: Every day | ORAL | Status: DC
  Administered 2019-10-16: 325 mg via ORAL
  Filled 2019-10-16 (×2): qty 1

## 2019-10-16 MED ORDER — IOHEXOL 350 MG/ML SOLN
100.0000 mL | Freq: Once | INTRAVENOUS | Status: AC | PRN
  Administered 2019-10-16: 19:00:00 100 mL via INTRAVENOUS

## 2019-10-16 MED ORDER — ASPIRIN 300 MG RE SUPP
300.0000 mg | Freq: Every day | RECTAL | Status: DC
  Filled 2019-10-16 (×2): qty 1

## 2019-10-16 MED ORDER — ALTEPLASE 100 MG IV SOLR
INTRAVENOUS | Status: AC
  Filled 2019-10-16: qty 100

## 2019-10-16 NOTE — ED Provider Notes (Addendum)
[facility] Emergency Department Provider Note  Time seen: 5:35 PM  I have reviewed the triage vital signs and the nursing notes.   HISTORY  Chief Complaint Code Stroke   HPI Kendra [name] is a 74 y.o. female with a past medical history of COPD, diabetes, hypertension, hyperlipidemia, prior CVA, presents to the emergency department for left-sided deficits.  According to the patient at 3:30 PM today she developed acute onset of left-sided weakness.  States she was unable to use her left arm, was able to ambulate but states she was dragging her left foot.  Patient states she has had 2 prior CVAs one in 2016 1 in 2017 both affecting the left side, but prior to 3:30 PM today she states she had much more use of her left upper and lower extremity.  Patient denies any recent illnesses fever cough shortness of breath.  Does state a moderate headache otherwise largely negative review of systems.   Past Medical History:  Diagnosis Date  . Back pain   . Cataract   . Cervical disc disorder   . Chest pain   . COPD (chronic obstructive pulmonary disease) ([place])   . Depression   . Diabetes ([place])   . Diabetic neuropathy ([place])   . GERD (gastroesophageal reflux disease)   . Hyperlipidemia   . Insomnia   . Neuropathy   . Osteoarthritis   . Osteopenia   . Reflux   . Rheumatoid arthritis ([place])   . Sleep apnea    no CPAP and no suggestion that she needed one per pt  . Stroke ([place]) 2015   and again 2017  - Weakness in left leg, staggers w/ walking  . Tenosynovitis     Patient Active Problem List   Diagnosis Date Noted  . Essential hypertension 12/19/2018  . Chronic midline low back pain without sciatica 05/07/2018  . Radiculopathy of leg 04/01/2017  . Calcification of aorta (HCC) 09/27/2016  . Early satiety 07/26/2016  . CMC arthritis, thumb, degenerative 07/16/2015  . Weight loss 06/07/2015  . Tenosynovitis of wrist 06/07/2015  . Protein calorie malnutrition  ([place]) 06/07/2015  . Diabetes mellitus type 2, controlled, with complications ([place]) 40/97/3532  . Cataract 03/30/2015  . Other and unspecified disc disorder of cervical region 03/30/2015  . Insomnia 03/30/2015  . Centrilobular emphysema ([place]) 03/30/2015  . Clinical depression 03/30/2015  . Dyslipidemia 03/30/2015  . Gastro-esophageal reflux disease without esophagitis 03/30/2015  . Increased thickness of nail 03/30/2015  . History of cerebrovascular accident with residual deficit 03/30/2015  . Osteoarthritis 03/30/2015  . Osteopenia 03/30/2015  . Allergic rhinitis 03/30/2015  . B12 deficiency 03/30/2015  . Tobacco use disorder 03/30/2015  . Vitamin D deficiency 03/30/2015  . Mycotic toenails 08/12/2014  . Diabetic peripheral neuropathy associated with type 2 diabetes mellitus ([place]) 08/12/2014    Past Surgical History:  Procedure Laterality Date  . BREAST BIOPSY    . NECK SURGERY    . OVARIAN CYST REMOVAL      Prior to Admission medications   Medication Sig Start Date End Date Taking? Authorizing Provider  acetaminophen (TYLENOL) 500 MG tablet Take 2 tablets (1,000 mg total) by mouth every 8 (eight) hours as needed for mild pain or moderate pain. 12/20/18   [name], NP  albuterol (VENTOLIN HFA) 108 (90 Base) MCG/ACT inhaler INHALE 1 TO 2 PUFFS INTO THE LUNGS EVERY 6 HOURS AS NEEDED FOR WHEEZING OR SHORTNESS OF BREATH 06/18/19   [name], NP  aspirin 81 MG EC tablet Take 1 tablet (81 mg total) by mouth daily. Swallow whole. 12/20/18   [name], NP  atorvastatin (LIPITOR) 40 MG tablet TAKE 1 TABLET BY MOUTH ONCE DAILY 08/04/19   Parks Ranger, Devonne Doughty, DO  baclofen (LIORESAL) 10 MG tablet TAKE 1 TABLET BY MOUTH ONCE DAILY AS NEEDED MUSCLE SPASMS Patient not taking: Reported on 10/16/2019 05/07/19   [name], NP  Blood Glucose Monitoring Suppl (ONETOUCH VERIO) w/Device KIT 1 kit by Does not apply route 2 (two) times daily. 12/20/18    [name], NP  buPROPion (WELLBUTRIN XL) 150 MG 24 hr tablet TAKE 1 TABLET BY MOUTH ONCE DAILY 08/04/19   Parks Ranger, Devonne Doughty, DO  Cholecalciferol (VITAMIN D3) 50 MCG (2000 UT) capsule Take 1 capsule (2,000 Units total) by mouth daily. 12/20/18   [name], NP  Fluticasone-Umeclidin-Vilant (TRELEGY ELLIPTA) 100-62.5-25 MCG/INH AEPB Inhale 1 puff into the lungs daily. 08/27/19   Karamalegos, Devonne Doughty, DO  glucose blood (ONETOUCH VERIO) test strip Use as instructed 12/20/18   [name], NP  Insulin Pen Needle (BD PEN NEEDLE NANO U/F) 32G X 4 MM MISC USE WITH LANTUS AS DIRECTED 12/20/18   [name], NP  Lancet Devices (ONE TOUCH DELICA LANCING DEV) MISC 1 Device by Does not apply route 2 (two) times daily. 12/20/18   [name], NP  lisinopril (ZESTRIL) 10 MG tablet Take 1 tablet (10 mg total) by mouth daily. 09/22/19   Karamalegos, Devonne Doughty, DO  meloxicam (MOBIC) 15 MG tablet TAKE 1 TABLET BY MOUTH ONCE DAILY AS NEEDED Patient not taking: Reported on 10/16/2019 08/04/19   Olin Hauser, DO  metFORMIN (GLUCOPHAGE-XR) 500 MG 24 hr tablet Take 1 tablet (500 mg total) by mouth 2 (two) times daily before a meal. 08/27/19   Karamalegos, Devonne Doughty, DO  nicotine polacrilex (COMMIT) 4 MG lozenge Take 4 mg by mouth as needed for smoking cessation.    [provider]  OneTouch Delica Lancets 16X MISC 1 Device by Does not apply route 2 (two) times daily. 12/20/18   [name], NP  polyethylene glycol powder (GLYCOLAX/MIRALAX) 17 GM/SCOOP powder Take 17-34 g by mouth daily as needed for mild constipation or moderate constipation. 03/05/19   Olin Hauser, DO    Allergies  Allergen Reactions  . Citalopram Nausea And Vomiting    Family History  Problem Relation Age of Onset  . Stroke Father   . Depression Father   . Diabetes Mother   . Hyperlipidemia Mother   . Drug abuse Sister   . Heart murmur Son   . Drug  abuse Sister   . Heart murmur Son   . Heart murmur Son     Social History Social History   Tobacco Use  . Smoking status: Current Some Day Smoker    Packs/day: 0.25    Types: Cigarettes  . Smokeless tobacco: Current User  Substance Use Topics  . Alcohol use: No  . Drug use: No    Review of Systems Constitutional: Negative for fever. Cardiovascular: Negative for chest pain. Respiratory: Negative for shortness of breath. Gastrointestinal: Negative for abdominal pain Musculoskeletal: Negative for musculoskeletal complaints Skin: Negative for skin complaints  Neurological: Moderate headache.  Left upper extremity left lower extremity weakness All other ROS negative  ____________________________________________   PHYSICAL EXAM:  VITAL SIGNS: ED Triage Vitals  Enc Vitals Group     BP 10/16/19 1701 105/62  Pulse Rate 10/16/19 1701 89     Resp 10/16/19 1701 18     Temp 10/16/19 1701 98.7 F (37.1 C)     Temp Source 10/16/19 1701 Oral     SpO2 10/16/19 1701 96 %     Weight 10/16/19 1707 153 lb (69.4 kg)     Height 10/16/19 1707 _0  (1.626 m)     Head Circumference --      Peak Flow --      Pain Score 10/16/19 1707 0     Pain Loc --      Pain Edu? --      Excl. in [place]? --    Constitutional: Alert and oriented. Well appearing and in no distress. Eyes: Normal exam ENT      Head: Normocephalic and atraumatic.      Mouth/Throat: Mucous membranes are moist. Cardiovascular: Normal rate, regular rhythm. Respiratory: Normal respiratory effort without tachypnea nor retractions. Breath sounds are clear  Gastrointestinal: Soft and nontender. No distention.  Musculoskeletal: Nontender with normal range of motion in all extremities. Neurologic:  Normal speech and language.  Slight left-sided facial droop.  Patient has moderate left upper extremity left lower extremity weakness left upper semigreater than left lower extremity. Skin:  Skin is warm, dry and intact.   Psychiatric: Mood and affect are normal.   ____________________________________________    EKG  EKG viewed and interpreted by myself shows a normal sinus rhythm at 90 bpm with a narrow QRS, normal axis, normal intervals, nonspecific without concerning ST changes.  ____________________________________________    RADIOLOGY   IMPRESSION:  1. New since 2016 but chronic appearing ischemia in the right MCA  territory. Underlying advanced chronic small vessel disease.  2. No or definite acute cortically based infarct. No acute  intracranial hemorrhage. ASPECTS 10.   ____________________________________________   INITIAL IMPRESSION / ASSESSMENT AND PLAN / ED COURSE  Pertinent labs & imaging results that were available during my care of the patient were reviewed by me and considered in my medical decision making (see chart for details).   Patient presents to the emergency department for left-sided weakness starting acutely at 3:30 PM today as well as a moderate headache.  Patient has had 2 CVAs previously with left-sided deficits but states her left side was functioning normally/baseline until 3:30 PM when she noticed an acute deficit.  On my examination patient has a slight left facial droop she has significant left upper extremity and mild left lower extremity weakness.  From a sensory exam the patient states diminished sensation of the left lower extremity but states that the right upper extremity is diminished compared to the left upper extremity and the right face is diminished compared to left face which is somewhat inconsistent.  We will obtain CT imaging stat neurology consultation for consideration of TPA.  CT shows new but chronic appearing right MCA ischemia.  Neurology has seen the patient does not believe the patient is a candidate for TPA at this time given she has all deficits and new deficits not entirely clear what is new versus old as well as her timeline is not as clear as  it initially appeared.  We will admit to the hospital for further work-up.  Spoke to radiology regarding the patient CTA that was ordered by neurology.  They state there is M2 occlusion noted however he states that appears to be chronic.  States a very small number and significant encephalomalacia in the area signifying again that this is likely  chronic with possible a mild amount of acute ischemia.  Patient is admitted to the hospital service. I will update neurology  LEKHA DANCER was evaluated in Emergency Department on 10/16/2019 for the symptoms described in the history of present illness. She was evaluated in the context of the global COVID-19 pandemic, which necessitated consideration that the patient might be at risk for infection with the SARS-CoV-2 virus that causes COVID-19. Institutional protocols and algorithms that pertain to the evaluation of patients at risk for COVID-19 are in a state of rapid change based on information released by regulatory bodies including the CDC and federal and state organizations. These policies and algorithms were followed during the patient's care in the ED.  CRITICAL CARE Performed by: Harvest Dark   Total critical care time: 30 minutes  Critical care time was exclusive of separately billable procedures and treating other patients.  Critical care was necessary to treat or prevent imminent or life-threatening deterioration.  Critical care was time spent personally by me on the following activities: development of treatment plan with patient and/or surrogate as well as nursing, discussions with consultants, evaluation of patient's response to treatment, examination of patient, obtaining history from patient or surrogate, ordering and performing treatments and interventions, ordering and review of laboratory studies, ordering and review of radiographic studies, pulse oximetry and re-evaluation of patient's  condition.  ____________________________________________   FINAL CLINICAL IMPRESSION(S) / ED DIAGNOSES  CVA   Harvest Dark, MD 10/16/19 Luci Bank, MD 10/16/19 [phone]

## 2019-10-16 NOTE — Consult Note (Addendum)
Duplicate. Entered in error. 

## 2019-10-16 NOTE — Patient Instructions (Signed)
Thank you allowing the Chronic Care Management Team to be a part of your care! It was a pleasure speaking with you today!     CCM (Chronic Care Management) Team    [name] RN, MSN, CCM Nurse Care Coordinator  [phone]   [name] PharmD  Clinical Pharmacist  [phone]   [name] LCSW Clinical Social Worker [phone]  Visit Information  Goals Addressed            This Visit's Progress   . PharmD-Medication Adherence       Current Barriers:  . Financial Barriers . Knowledge deficits related to coordination of her own care . Limited social support  Pharmacist Clinical Goal(s):   [name] Over the next 30 days, patient will demonstrate improved medication adherence as evidenced by verbalized understanding of prescribed medication regimen, assistance available, and patient report of adherence  Interventions: . Follow up with patient regarding COVID-19 exposure ? Per note from CCM Nurse Case Manager on 1/21 patient reported recent exposure to COVID-19.  ? Ms. Spofford denies any SOB or fever today. Reports occasional cough, but no more frequent or bothersome than her baseline with COPD ? Reports had COVID-19 testing today and is awaiting result ? Provide education regarding [id number] and COVID-19 vaccination . Follow up regarding blood pressure monitoring and control. ? Reports taking lisinopril 10 mg daily as directed ? Review recent blood pressure results (see below) ? Denies resting for ?5 minutes before checking BP this week - states that she forgot this direction. Patient writes herself a reminder note on BP log now . Counsel again on BP monitoring technique, including resting quietly >5 minutes before checking BP; avoiding nicotine, drinking caffeinated beverages or exercise within 30 minutes before measuring . Discuss impact of sodium/salt in diet for blood pressure control ? Reports continues to receive meals on wheels, but as she has noticed this  food to be salty, has started to eat less frequently ? Patient reports she continues to limit sodium when preparing her own meals and denies adding salt to her food ? Reports planning to start with [employer] this Saturday, 1/30, to obtain more fresh food options, in place of meals on wheels . Counsel on importance of medication adherence ? Reports taking medication as directed from pill pack from [facility]. Encourage patient to label package with days of the week.  ? Encourage patient to restart using phone alarm reminders. ? Reports using maintenance, Trelegy, inhaler and albuterol inhaler as directed . Follow up with patient regarding smoking cessation.  ? Reports currently smoking ~1/2 cigarette/day  ? Reports continuing to use nicotine lozenges (? 1 lozenges/day) as needed as directed ? Provide encouragement  ? Patient has set quit date: 11/02/2019 ? Encourage patient to let family know so that they can provide support as well . Follow up regarding blood sugar control ? Confirms taking metformin ER 500mg  twice daily as directed from pill packaging . Will mail patient BP monitoring education handout  . Collaborate with [name] - next pill pack scheduled to go out to patient between 2/3 and 2/5  Patient Self Care Activities:  . Patient takes medications as directed with aid of adherence tools. o Using pill packaging from [facility] o Patient to restart using daily phone alarm reminders (7 am & 8 pm) . Calls pharmacy for medication refills . Patient to attend scheduled medical appointments . Calls provider office for new concerns or questions  . Patient to  check blood sugars and keep log . Patient to check blood pressure daily and keep log Date AM BP PM BP  23 - January 171/107, HR 75 159/93, HR 74  24 - January 143/81, HR 88 -  25 - January 177/107, HR 72 -  26 - January - 143/101, HR 82  27 - January 156/97, HR 69 141/80, HR 72  28 - January 147/105, HR 78   -  Blood pressure goal: < 130/80   Please see past updates related to this goal by clicking on the "Past Updates" button in the selected goal          The patient verbalized understanding of instructions provided today and declined a print copy of patient instruction materials.   Telephone follow up appointment with care management team member scheduled for: 2/2  [name], PharmD, [place] [phone]

## 2019-10-16 NOTE — Progress Notes (Signed)
Interim Note  Received a call from tele- neurology Dr. Carron Brazen regarding this patient. Patient appears to be a 74 year old female with history of right MCA stroke status post right M1 occlusion now presents with headache and increased weakness on the left side.  CT a shows a right M2 occlusion/critical stenosis with CT perfusion deficit up to only 10 mL.  Her NIH stroke scale is a 2.  Also patient has asymptomatic left ICA proximal string sign representing critical stenosis.  Agree with telemetry neurologist that likely chronic M2 occlusion and low NIH stroke scale would not make her a candidate for thrombectomy.  Her left ICA is concerning and does need urgent evaluation.  For these reasons I do think that she would be better served being evaluated at [facility] by stroke team.  Spoke with Dr. Joni Fears, he will work on transfer.  Also asked him to speak to his nurse for close monitoring of symptoms, if her stroke scale worsens to notify neurology MD stat.  We will see the patient when she arrives to [facility].

## 2019-10-16 NOTE — Consult Note (Addendum)
History and Physical    Kendra [name]:[id number] DOB: [birth year] DOA: 10/16/2019  PCP: [name], NP (Inactive)  Patient coming from: Home  I have personally briefly reviewed patient's old medical records in [place]  Chief Complaint: Left-sided weakness  HPI: Kendra [name] is a 74 y.o. 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  Patient states she was in her usual state of health until around 3 PM on 10/16/2019.  She noticed having worsening left-sided weakness in her arm and leg compared to her baseline.  She had difficulty ambulating appropriately even with the use of her walker.  She did not have any falls or loss of consciousness.  She denies any change in vision, nausea, vomiting, dysphagia, change in speech, chest pain, palpitations, dyspnea, cough, abdominal pain, dysuria.  She currently takes aspirin 81 mg daily and atorvastatin 80 mg daily.  ED Course:  Initial vitals showed BP 105/62, pulse 89, RR 18, temp 98.7 Fahrenheit, SPO2 96% on room air.  Labs are notable for WBC 6.3, hemoglobin 11.4, platelets 230,000, sodium 141, potassium 3.7, bicarb 25, BUN 14, creatinine 0.78, serum glucose 102, LFTs within normal limits.  SARS-CoV-2 PCR panel is collected and pending.  CT head without contrast showed chronic appearing ischemia in the right MCA territory without definite acute cortically based infarct.  Teleneurology were consulted and recommended against TPA but advised that patient will need admission for further work-up.  CTA head/neck and CT cerebral perfusion studies were ordered and pending.  The hospitalist service was consulted for further evaluation and management.  Review of Systems: All systems reviewed and are negative except as documented in  history of present illness above.   Past Medical History:  Diagnosis Date  . Back pain   . Cataract   . Cervical disc disorder   . Chest pain   . COPD (chronic obstructive pulmonary disease) ([place])   . Depression   . Diabetes ([place])   . Diabetic neuropathy ([place])   . GERD (gastroesophageal reflux disease)   . Hyperlipidemia   . Insomnia   . Neuropathy   . Osteoarthritis   . Osteopenia   . Reflux   . Rheumatoid arthritis ([place])   . Sleep apnea    no CPAP and no suggestion that she needed one per pt  . Stroke ([place]) 2015   and again 2017  - Weakness in left leg, staggers w/ walking  . Tenosynovitis     Past Surgical History:  Procedure Laterality Date  . BREAST BIOPSY    . NECK SURGERY    . OVARIAN CYST REMOVAL      Social History:  reports that she has been smoking cigarettes. She has been smoking about 0.25 packs per day. She uses smokeless tobacco. She reports that she does not drink alcohol or use drugs.  Allergies  Allergen Reactions  . Citalopram Nausea And Vomiting    Family History  Problem Relation Age of Onset  . Stroke Father   . Depression Father   . Diabetes Mother   . Hyperlipidemia Mother   . Drug abuse Sister   . Heart murmur Son   . Drug abuse Sister   . Heart murmur Son   . Heart murmur Son      Prior to Admission medications   Medication Sig Start Date  End Date Taking? Authorizing Provider  acetaminophen (TYLENOL) 500 MG tablet Take 2 tablets (1,000 mg total) by mouth every 8 (eight) hours as needed for mild pain or moderate pain. 12/20/18   [name], NP  albuterol (VENTOLIN HFA) 108 (90 Base) MCG/ACT inhaler INHALE 1 TO 2 PUFFS INTO THE LUNGS EVERY 6 HOURS AS NEEDED FOR WHEEZING OR SHORTNESS OF BREATH 06/18/19   [name], NP  aspirin 81 MG EC tablet Take 1 tablet (81 mg total) by mouth daily. Swallow whole. 12/20/18   [name], NP  atorvastatin (LIPITOR) 40 MG tablet TAKE 1 TABLET BY MOUTH ONCE DAILY 08/04/19    Parks Ranger, Devonne Doughty, DO  baclofen (LIORESAL) 10 MG tablet TAKE 1 TABLET BY MOUTH ONCE DAILY AS NEEDED MUSCLE SPASMS Patient not taking: Reported on 10/16/2019 05/07/19   [name], NP  Blood Glucose Monitoring Suppl (ONETOUCH VERIO) w/Device KIT 1 kit by Does not apply route 2 (two) times daily. 12/20/18   [name], NP  buPROPion (WELLBUTRIN XL) 150 MG 24 hr tablet TAKE 1 TABLET BY MOUTH ONCE DAILY 08/04/19   Parks Ranger, Devonne Doughty, DO  Cholecalciferol (VITAMIN D3) 50 MCG (2000 UT) capsule Take 1 capsule (2,000 Units total) by mouth daily. 12/20/18   [name], NP  Fluticasone-Umeclidin-Vilant (TRELEGY ELLIPTA) 100-62.5-25 MCG/INH AEPB Inhale 1 puff into the lungs daily. 08/27/19   Karamalegos, Devonne Doughty, DO  glucose blood (ONETOUCH VERIO) test strip Use as instructed 12/20/18   [name], NP  Insulin Pen Needle (BD PEN NEEDLE NANO U/F) 32G X 4 MM MISC USE WITH LANTUS AS DIRECTED 12/20/18   [name], NP  Lancet Devices (ONE TOUCH DELICA LANCING DEV) MISC 1 Device by Does not apply route 2 (two) times daily. 12/20/18   [name], NP  lisinopril (ZESTRIL) 10 MG tablet Take 1 tablet (10 mg total) by mouth daily. 09/22/19   Karamalegos, Devonne Doughty, DO  meloxicam (MOBIC) 15 MG tablet TAKE 1 TABLET BY MOUTH ONCE DAILY AS NEEDED Patient not taking: Reported on 10/16/2019 08/04/19   Olin Hauser, DO  metFORMIN (GLUCOPHAGE-XR) 500 MG 24 hr tablet Take 1 tablet (500 mg total) by mouth 2 (two) times daily before a meal. 08/27/19   Karamalegos, Devonne Doughty, DO  nicotine polacrilex (COMMIT) 4 MG lozenge Take 4 mg by mouth as needed for smoking cessation.    [provider]  OneTouch Delica Lancets 59Y MISC 1 Device by Does not apply route 2 (two) times daily. 12/20/18   [name], NP  polyethylene glycol powder (GLYCOLAX/MIRALAX) 17 GM/SCOOP powder Take 17-34 g by mouth daily as needed for mild constipation or  moderate constipation. 03/05/19   Olin Hauser, DO    Physical Exam: Vitals:   10/16/19 1800 10/16/19 1900 10/16/19 1930 10/16/19 2059  BP: 126/84 (!) 149/75 140/77 121/72  Pulse: 78 86 88 83  Resp:  _0 Temp:    98.1 F (36.7 C)  TempSrc:    Oral  SpO2: 99% 100% 97% 100%  Weight:      Height:       Constitutional: Elderly woman resting in bed with head elevated, NAD, calm, comfortable Eyes: PERRL, EOMI, lids and conjunctivae normal ENMT: Mucous membranes are moist. Posterior pharynx clear of any exudate or lesions.Normal dentition.  Neck: normal, supple, no masses. Respiratory: clear to auscultation bilaterally, no wheezing, no crackles. Normal respiratory effort. No accessory muscle use.  Cardiovascular: Regular rate  and rhythm, no murmurs / rubs / gallops. No extremity edema. 2+ pedal pulses. Abdomen: no tenderness, no masses palpated. No hepatosplenomegaly. Bowel sounds positive.  Musculoskeletal: no clubbing / cyanosis. No joint deformity upper and lower extremities. Good ROM, no contractures. Normal muscle tone.  Skin: no rashes, lesions, ulcers. No induration Neurologic: CN 2-12 grossly intact. Sensation intact, Strength 5/5 in right upper and lower extremity.  Strength 4/5 left upper and lower extremity. Psychiatric: Normal judgment and insight. Alert and oriented x 3. Normal mood.   Labs on Admission: I have personally reviewed following labs and imaging studies  CBC: Recent Labs  Lab 10/16/19 1717  WBC 6.3  NEUTROABS 3.6  HGB 11.4*  HCT 35.2*  MCV 93.9  PLT 696   Basic Metabolic Panel: Recent Labs  Lab 10/16/19 1717  NA 141  K 3.7  CL 106  CO2 25  GLUCOSE 102*  BUN 14  CREATININE 0.78  CALCIUM 9.6   GFR: Estimated Creatinine Clearance: 55.7 mL/min (by C-G formula based on SCr of 0.78 mg/dL). Liver Function Tests: Recent Labs  Lab 10/16/19 1717  AST 28  ALT 26  ALKPHOS 65  BILITOT 0.6  PROT 7.2  ALBUMIN 4.0   No results for  input(s): LIPASE, AMYLASE in the last 168 hours. No results for input(s): AMMONIA in the last 168 hours. Coagulation Profile: Recent Labs  Lab 10/16/19 1717  INR 1.1   Cardiac Enzymes: No results for input(s): CKTOTAL, CKMB, CKMBINDEX, TROPONINI in the last 168 hours. BNP (last 3 results) No results for input(s): PROBNP in the last 8760 hours. HbA1C: No results for input(s): HGBA1C in the last 72 hours. CBG: Recent Labs  Lab 10/16/19 1716  GLUCAP 95   Lipid Profile: No results for input(s): CHOL, HDL, LDLCALC, TRIG, CHOLHDL, LDLDIRECT in the last 72 hours. Thyroid Function Tests: No results for input(s): TSH, T4TOTAL, FREET4, T3FREE, THYROIDAB in the last 72 hours. Anemia Panel: No results for input(s): VITAMINB12, FOLATE, FERRITIN, TIBC, IRON, RETICCTPCT in the last 72 hours. Urine analysis:    Component Value Date/Time   COLORURINE YELLOW (A) 04/24/2015 2126   APPEARANCEUR CLEAR (A) 04/24/2015 2126   APPEARANCEUR Clear 09/01/2014 2259   LABSPEC 1.011 04/24/2015 2126   LABSPEC 1.027 09/01/2014 2259   PHURINE 5.0 04/24/2015 2126   GLUCOSEU NEGATIVE 04/24/2015 2126   GLUCOSEU >=500 09/01/2014 2259   HGBUR NEGATIVE 04/24/2015 2126   BILIRUBINUR NEGATIVE 04/24/2015 2126   BILIRUBINUR Negative 09/01/2014 [place] 04/24/2015 2126   PROTEINUR NEGATIVE 04/24/2015 2126   NITRITE NEGATIVE 04/24/2015 2126   LEUKOCYTESUR NEGATIVE 04/24/2015 2126   LEUKOCYTESUR Negative 09/01/2014 2259    Radiological Exams on Admission: CT ANGIO HEAD W OR WO CONTRAST  Addendum Date: 10/16/2019   ADDENDUM REPORT: 10/16/2019 19:40 ADDENDUM: Study discussed by telephone with Dr. Kerman Passey in the ED on 10/16/2019 at 1933 hours. Electronically Signed   By: Genevie Ann M.D.   On: 10/16/2019 19:40   Result Date: 10/16/2019 CLINICAL DATA:  74 year old female code stroke presentation with left side weakness since 1530 or 1600 hours. EXAM: CT ANGIOGRAPHY HEAD AND NECK CT PERFUSION BRAIN  TECHNIQUE: Multidetector CT imaging of the head and neck was performed using the standard protocol during bolus administration of intravenous contrast. Multiplanar CT image reconstructions and MIPs were obtained to evaluate the vascular anatomy. Carotid stenosis measurements (when applicable) are obtained utilizing NASCET criteria, using the distal internal carotid diameter as the denominator. Multiphase CT imaging of the brain was performed  following IV bolus contrast injection. Subsequent parametric perfusion maps were calculated using RAPID software. CONTRAST:  16m OMNIPAQUE IOHEXOL 350 MG/ML SOLN COMPARISON:  Plain head CT 1724 hours today. FINDINGS: CT Brain Perfusion Findings: ASPECTS: 10, presumed chronic right MCA encephalomalacia. CBF (<30%) Volume: None, CBV appears increased in the area of abnormal T-max. Perfusion (Tmax>6.0s) volume: 142mMismatch Volume: 1089mnfarction Location:No infarct core, but ischemia localizes to the right frontal operculum and posterior right MCA territory. CT HEAD Brain: Repeat plain head CT images at 1847 hours. Abnormal cortical and white matter hypodensity in the posterior and middle right MCA territory appears stable from earlier. No associated hemorrhage or mass effect. As before there seems to be ex vacuo enlargement of the right lateral ventricle. Calvarium and skull base: Stable, negative. Paranasal sinuses: Visualized paranasal sinuses and mastoids are stable and well pneumatized. Orbits: No acute findings. CTA NECK Skeleton: Absent dentition. Prior cervical ACDF with solid arthrodesis. Advanced cervical spine degeneration elsewhere. No acute osseous abnormality identified. Upper chest: Centrilobular and to a lesser extent paraseptal emphysema. Calcified aortic atherosclerosis. No superior mediastinal lymphadenopathy. Other neck: No acute findings. Aortic arch: 3 vessel arch configuration. Mild great vessel origin atherosclerosis. Right carotid system:  Brachiocephalic artery plaque without stenosis. Normal right CCA origin. Tortuous proximal right CCA with a kinked appearance at the thoracic inlet on series 508, image 119. Heavily calcified right ECA and ICA origins with additional posterior right ICA origin soft plaque. Right ICA origin stenosis is 60-65 % with respect to the distal vessel. See series 508, image 101 and series 512, image 49. The right ICA remains patent to the skull base. Left carotid system: Negative left CCA origin. Tortuous proximal left CCA. Heavily calcified left carotid bifurcation and right ICA origin. RADIOGRAPHIC STRING SIGN STENOSIS just proximal to the bifurcation on series 512, image 98. Less than 50% additional proximal right ICA stenosis. The right ICA remains patent to the skull base. Vertebral arteries: Mild proximal right subclavian calcified plaque without stenosis. Normal right vertebral artery origin. Right vertebral artery is patent to the skull base without plaque or stenosis. Normal proximal left subclavian artery. Normal left vertebral artery origin. Tortuous left V1 segment. The left vertebral is mildly non dominant and patent to the skull base without stenosis. CTA HEAD Posterior circulation: Mild bilateral V4 calcified plaque without stenosis. The right V4 segment is mildly dominant. Patent PICA origins and vertebrobasilar junction without stenosis. Patent basilar artery without stenosis. Normal SCA and PCA origins. Posterior communicating arteries are diminutive or absent. Bilateral PCA branches are within normal limits. Anterior circulation: Both ICA siphons are patent. On the left there is moderate calcified plaque and moderate stenosis just distal to the anterior genu (series 509, image 225. Normal left ophthalmic artery origin. On the right moderate to severe calcified plaque most pronounced in the cavernous segment with moderate stenosis at the petrous and cavernous junction. Normal right ophthalmic artery origin.  Patent carotid termini with normal MCA and ACA origins. Anterior communicating artery and bilateral ACA branches are within normal limits. Left MCA M1 segment and trifurcation are patent without stenosis. The left MCA trifurcation is ectatic but there is no discrete saccular aneurysm. Left MCA branches are within normal limits. Right MCA M1 segment and bifurcation are patent without stenosis. In the dominant middle M2 division there is a high-grade stenosis or short segment occlusion best demonstrated on series 515, image 8 with preserved distal enhancement although the downstream middle MCA branches are highly diminutive. Venous sinuses: Patent. Anatomic  variants: Dominant right vertebral artery. Review of the MIP images confirms the above findings IMPRESSION: 1. Positive for Right MCA M2 middle division near-occlusion/critical stenosis (series 515, image 8). The MCA branches distal to the this are attenuated. However, CTP detects no infarct core and only 10 mL of Right MCA territory penumbra. Plus the plain CT appearance is that of chronic right MCA ischemia. Therefore, suspect this constellation reflects chronic more so than acute Right MCA vessel disease. 2. And furthermore, there are bilateral cervical ICA and ICA siphon hemodynamically significant stenoses: - Right ICA origin 60-65% stenosis. - Left carotid bifurcation RADIOGRAPHIC STRING SIGN STENOSIS. - Moderate bilateral ICA siphon stenosis due to calcified plaque. 3. Negative vertebral and posterior circulation. 4. Aortic Atherosclerosis (ICD10-I70.0) and Emphysema (ICD10-J43.9). Electronically Signed: By: Genevie Ann M.D. On: 10/16/2019 19:26   CT ANGIO NECK W OR WO CONTRAST  Addendum Date: 10/16/2019   ADDENDUM REPORT: 10/16/2019 19:40 ADDENDUM: Study discussed by telephone with Dr. Kerman Passey in the ED on 10/16/2019 at 1933 hours. Electronically Signed   By: Genevie Ann M.D.   On: 10/16/2019 19:40   Result Date: 10/16/2019 CLINICAL DATA:  74 year old  female code stroke presentation with left side weakness since 1530 or 1600 hours. EXAM: CT ANGIOGRAPHY HEAD AND NECK CT PERFUSION BRAIN TECHNIQUE: Multidetector CT imaging of the head and neck was performed using the standard protocol during bolus administration of intravenous contrast. Multiplanar CT image reconstructions and MIPs were obtained to evaluate the vascular anatomy. Carotid stenosis measurements (when applicable) are obtained utilizing NASCET criteria, using the distal internal carotid diameter as the denominator. Multiphase CT imaging of the brain was performed following IV bolus contrast injection. Subsequent parametric perfusion maps were calculated using RAPID software. CONTRAST:  165m OMNIPAQUE IOHEXOL 350 MG/ML SOLN COMPARISON:  Plain head CT 1724 hours today. FINDINGS: CT Brain Perfusion Findings: ASPECTS: 10, presumed chronic right MCA encephalomalacia. CBF (<30%) Volume: None, CBV appears increased in the area of abnormal T-max. Perfusion (Tmax>6.0s) volume: 121mMismatch Volume: 1041mnfarction Location:No infarct core, but ischemia localizes to the right frontal operculum and posterior right MCA territory. CT HEAD Brain: Repeat plain head CT images at 1847 hours. Abnormal cortical and white matter hypodensity in the posterior and middle right MCA territory appears stable from earlier. No associated hemorrhage or mass effect. As before there seems to be ex vacuo enlargement of the right lateral ventricle. Calvarium and skull base: Stable, negative. Paranasal sinuses: Visualized paranasal sinuses and mastoids are stable and well pneumatized. Orbits: No acute findings. CTA NECK Skeleton: Absent dentition. Prior cervical ACDF with solid arthrodesis. Advanced cervical spine degeneration elsewhere. No acute osseous abnormality identified. Upper chest: Centrilobular and to a lesser extent paraseptal emphysema. Calcified aortic atherosclerosis. No superior mediastinal lymphadenopathy. Other neck: No  acute findings. Aortic arch: 3 vessel arch configuration. Mild great vessel origin atherosclerosis. Right carotid system: Brachiocephalic artery plaque without stenosis. Normal right CCA origin. Tortuous proximal right CCA with a kinked appearance at the thoracic inlet on series 508, image 119. Heavily calcified right ECA and ICA origins with additional posterior right ICA origin soft plaque. Right ICA origin stenosis is 60-65 % with respect to the distal vessel. See series 508, image 101 and series 512, image 49. The right ICA remains patent to the skull base. Left carotid system: Negative left CCA origin. Tortuous proximal left CCA. Heavily calcified left carotid bifurcation and right ICA origin. RADIOGRAPHIC STRING SIGN STENOSIS just proximal to the bifurcation on series 512, image 98. Less than 50%  additional proximal right ICA stenosis. The right ICA remains patent to the skull base. Vertebral arteries: Mild proximal right subclavian calcified plaque without stenosis. Normal right vertebral artery origin. Right vertebral artery is patent to the skull base without plaque or stenosis. Normal proximal left subclavian artery. Normal left vertebral artery origin. Tortuous left V1 segment. The left vertebral is mildly non dominant and patent to the skull base without stenosis. CTA HEAD Posterior circulation: Mild bilateral V4 calcified plaque without stenosis. The right V4 segment is mildly dominant. Patent PICA origins and vertebrobasilar junction without stenosis. Patent basilar artery without stenosis. Normal SCA and PCA origins. Posterior communicating arteries are diminutive or absent. Bilateral PCA branches are within normal limits. Anterior circulation: Both ICA siphons are patent. On the left there is moderate calcified plaque and moderate stenosis just distal to the anterior genu (series 509, image 225. Normal left ophthalmic artery origin. On the right moderate to severe calcified plaque most pronounced in  the cavernous segment with moderate stenosis at the petrous and cavernous junction. Normal right ophthalmic artery origin. Patent carotid termini with normal MCA and ACA origins. Anterior communicating artery and bilateral ACA branches are within normal limits. Left MCA M1 segment and trifurcation are patent without stenosis. The left MCA trifurcation is ectatic but there is no discrete saccular aneurysm. Left MCA branches are within normal limits. Right MCA M1 segment and bifurcation are patent without stenosis. In the dominant middle M2 division there is a high-grade stenosis or short segment occlusion best demonstrated on series 515, image 8 with preserved distal enhancement although the downstream middle MCA branches are highly diminutive. Venous sinuses: Patent. Anatomic variants: Dominant right vertebral artery. Review of the MIP images confirms the above findings IMPRESSION: 1. Positive for Right MCA M2 middle division near-occlusion/critical stenosis (series 515, image 8). The MCA branches distal to the this are attenuated. However, CTP detects no infarct core and only 10 mL of Right MCA territory penumbra. Plus the plain CT appearance is that of chronic right MCA ischemia. Therefore, suspect this constellation reflects chronic more so than acute Right MCA vessel disease. 2. And furthermore, there are bilateral cervical ICA and ICA siphon hemodynamically significant stenoses: - Right ICA origin 60-65% stenosis. - Left carotid bifurcation RADIOGRAPHIC STRING SIGN STENOSIS. - Moderate bilateral ICA siphon stenosis due to calcified plaque. 3. Negative vertebral and posterior circulation. 4. Aortic Atherosclerosis (ICD10-I70.0) and Emphysema (ICD10-J43.9). Electronically Signed: By: Genevie Ann M.D. On: 10/16/2019 19:26   CT CEREBRAL PERFUSION W CONTRAST  Addendum Date: 10/16/2019   ADDENDUM REPORT: 10/16/2019 19:40 ADDENDUM: Study discussed by telephone with Dr. Kerman Passey in the ED on 10/16/2019 at 1933 hours.  Electronically Signed   By: Genevie Ann M.D.   On: 10/16/2019 19:40   Result Date: 10/16/2019 CLINICAL DATA:  74 year old female code stroke presentation with left side weakness since 1530 or 1600 hours. EXAM: CT ANGIOGRAPHY HEAD AND NECK CT PERFUSION BRAIN TECHNIQUE: Multidetector CT imaging of the head and neck was performed using the standard protocol during bolus administration of intravenous contrast. Multiplanar CT image reconstructions and MIPs were obtained to evaluate the vascular anatomy. Carotid stenosis measurements (when applicable) are obtained utilizing NASCET criteria, using the distal internal carotid diameter as the denominator. Multiphase CT imaging of the brain was performed following IV bolus contrast injection. Subsequent parametric perfusion maps were calculated using RAPID software. CONTRAST:  165m OMNIPAQUE IOHEXOL 350 MG/ML SOLN COMPARISON:  Plain head CT 1724 hours today. FINDINGS: CT Brain Perfusion Findings: ASPECTS: 10, presumed  chronic right MCA encephalomalacia. CBF (<30%) Volume: None, CBV appears increased in the area of abnormal T-max. Perfusion (Tmax>6.0s) volume: 106m Mismatch Volume: 186mInfarction Location:No infarct core, but ischemia localizes to the right frontal operculum and posterior right MCA territory. CT HEAD Brain: Repeat plain head CT images at 1847 hours. Abnormal cortical and white matter hypodensity in the posterior and middle right MCA territory appears stable from earlier. No associated hemorrhage or mass effect. As before there seems to be ex vacuo enlargement of the right lateral ventricle. Calvarium and skull base: Stable, negative. Paranasal sinuses: Visualized paranasal sinuses and mastoids are stable and well pneumatized. Orbits: No acute findings. CTA NECK Skeleton: Absent dentition. Prior cervical ACDF with solid arthrodesis. Advanced cervical spine degeneration elsewhere. No acute osseous abnormality identified. Upper chest: Centrilobular and to a  lesser extent paraseptal emphysema. Calcified aortic atherosclerosis. No superior mediastinal lymphadenopathy. Other neck: No acute findings. Aortic arch: 3 vessel arch configuration. Mild great vessel origin atherosclerosis. Right carotid system: Brachiocephalic artery plaque without stenosis. Normal right CCA origin. Tortuous proximal right CCA with a kinked appearance at the thoracic inlet on series 508, image 119. Heavily calcified right ECA and ICA origins with additional posterior right ICA origin soft plaque. Right ICA origin stenosis is 60-65 % with respect to the distal vessel. See series 508, image 101 and series 512, image 49. The right ICA remains patent to the skull base. Left carotid system: Negative left CCA origin. Tortuous proximal left CCA. Heavily calcified left carotid bifurcation and right ICA origin. RADIOGRAPHIC STRING SIGN STENOSIS just proximal to the bifurcation on series 512, image 98. Less than 50% additional proximal right ICA stenosis. The right ICA remains patent to the skull base. Vertebral arteries: Mild proximal right subclavian calcified plaque without stenosis. Normal right vertebral artery origin. Right vertebral artery is patent to the skull base without plaque or stenosis. Normal proximal left subclavian artery. Normal left vertebral artery origin. Tortuous left V1 segment. The left vertebral is mildly non dominant and patent to the skull base without stenosis. CTA HEAD Posterior circulation: Mild bilateral V4 calcified plaque without stenosis. The right V4 segment is mildly dominant. Patent PICA origins and vertebrobasilar junction without stenosis. Patent basilar artery without stenosis. Normal SCA and PCA origins. Posterior communicating arteries are diminutive or absent. Bilateral PCA branches are within normal limits. Anterior circulation: Both ICA siphons are patent. On the left there is moderate calcified plaque and moderate stenosis just distal to the anterior genu  (series 509, image 225. Normal left ophthalmic artery origin. On the right moderate to severe calcified plaque most pronounced in the cavernous segment with moderate stenosis at the petrous and cavernous junction. Normal right ophthalmic artery origin. Patent carotid termini with normal MCA and ACA origins. Anterior communicating artery and bilateral ACA branches are within normal limits. Left MCA M1 segment and trifurcation are patent without stenosis. The left MCA trifurcation is ectatic but there is no discrete saccular aneurysm. Left MCA branches are within normal limits. Right MCA M1 segment and bifurcation are patent without stenosis. In the dominant middle M2 division there is a high-grade stenosis or short segment occlusion best demonstrated on series 515, image 8 with preserved distal enhancement although the downstream middle MCA branches are highly diminutive. Venous sinuses: Patent. Anatomic variants: Dominant right vertebral artery. Review of the MIP images confirms the above findings IMPRESSION: 1. Positive for Right MCA M2 middle division near-occlusion/critical stenosis (series 515, image 8). The MCA branches distal to the this are attenuated.  However, CTP detects no infarct core and only 10 mL of Right MCA territory penumbra. Plus the plain CT appearance is that of chronic right MCA ischemia. Therefore, suspect this constellation reflects chronic more so than acute Right MCA vessel disease. 2. And furthermore, there are bilateral cervical ICA and ICA siphon hemodynamically significant stenoses: - Right ICA origin 60-65% stenosis. - Left carotid bifurcation RADIOGRAPHIC STRING SIGN STENOSIS. - Moderate bilateral ICA siphon stenosis due to calcified plaque. 3. Negative vertebral and posterior circulation. 4. Aortic Atherosclerosis (ICD10-I70.0) and Emphysema (ICD10-J43.9). Electronically Signed: By: Genevie Ann M.D. On: 10/16/2019 19:26   CT HEAD CODE STROKE WO CONTRAST  Addendum Date: 10/16/2019     ADDENDUM REPORT: 10/16/2019 17:49 ADDENDUM: Study discussed by telephone with Dr. Kerman Passey on 10/16/2019 at 1743 hours. Electronically Signed   By: Genevie Ann M.D.   On: 10/16/2019 17:49   Result Date: 10/16/2019 CLINICAL DATA:  Code stroke. 74 year old female with left side weakness since around 15 30 or 1600 hours today. EXAM: CT HEAD WITHOUT CONTRAST TECHNIQUE: Contiguous axial images were obtained from the base of the skull through the vertex without intravenous contrast. COMPARISON:  Head CT 04/24/2015. Brain MRI 09/02/2014. FINDINGS: Brain: Confluent new right frontal lobe white matter hypodensity, but associated with ex vacuo enlargement of the right lateral ventricle. There is new chronic appearing encephalomalacia of the right insula and operculum. Progressed cerebral white matter and deep gray matter hypodensity elsewhere in both hemispheres since 2016. No acute intracranial hemorrhage identified. No midline shift, mass effect, or evidence of intracranial mass lesion. Vascular: Calcified atherosclerosis at the skull base. No suspicious intracranial vascular hyperdensity. Skull: No acute osseous abnormality identified. Sinuses/Orbits: Visualized paranasal sinuses and mastoids are stable and well pneumatized. Other: No acute orbit or scalp soft tissue findings. ASPECTS [facility] Stroke Program Early CT Score) Total score (0-10 with 10 being normal): 10 (presuming chronic encephalomalacia in the right frontal lobe). IMPRESSION: 1. New since 2016 but chronic appearing ischemia in the right MCA territory. Underlying advanced chronic small vessel disease. 2. No or definite acute cortically based infarct. No acute intracranial hemorrhage. ASPECTS 10. Electronically Signed: By: Genevie Ann M.D. On: 10/16/2019 17:38    EKG: Independently reviewed. Sinus rhythm without acute ischemic changes, baseline artifact present.  When compared to prior, tachycardia has resolved.  Assessment/Plan Principal Problem:    Weakness of left upper extremity Active Problems:   Hyperlipidemia associated with type 2 diabetes mellitus (HCC)   History of cerebrovascular accident with residual deficit   Diabetes mellitus type 2, controlled, with complications ([place])   Hypertension associated with diabetes ([place])   Left-sided weakness   Worsened LUE weakness/ history of right MCA CVA: Patient reported worsening left upper and lower extremity weakness from her baseline since about 3 PM 10/16/2019.  She was seen by teleneurology in the ED and at time of presentation was not considered a candidate for TPA.  Initial CT head without contrast showed chronic appearing ischemia in the right MCA territory without definite acute cortically based infarct.  Follow-up CTA head/neck showed a right M2 occlusion which was felt to be chronic in nature as CT cerebral perfusion study did not show an infarct core and perfusion deficit to only 10 mL.  She was also noted to have left ICA proximal string sign representing critical stenosis.  Neurology at [facility], Dr. Lorraine Lax, were consulted who recommended transfer to the progressive care unit at [name] under the hospitalist service for close monitoring given the  complexity of this case.  Recommendations include urgent vascular surgery evaluation and continued neurochecks/monitoring of NIHSS.  Will need to notify neurology ASAP if NIHSS >6.  I have consulted the hospitalist team to [name] who have accepted the patient for transfer.  Of note, patient's SARS-CoV-2 PCR test is negative.  Addendum: I discussed the plan of care with the patient who is agreeable to transfer to [facility].  I also discussed the plan of care with her son Cynia Abruzzo by phone [phone] who is also understanding and in agreement of the plan.  All questions were answered.   Zada Finders MD [facility]  If 7PM-7AM, please contact  night-coverage [email or web address]  10/16/2019, 9:27 PM

## 2019-10-16 NOTE — ED Notes (Addendum)
EDP at bedside, tele stroke notified [phone], tpa at bedside

## 2019-10-16 NOTE — ED Triage Notes (Signed)
Pt here via EMS with c/o weakness to left arm and left leg, states she thinks this was around 3:30 or 4:00pm today. Hx of strokes with left sided weakness, pt states her left arm started having tremors and feeling weak, left leg weakness followed. Her son felt she had a facial droop, per pt, she didn't have a facial droop prior to this afternoon. Facial droop noted by this RN. Left arm dropped to pts lap 2 seconds after counting began, right arm strong. Speech clear, pt excessively yawning in triage, answers all questions appropriately. NAD.

## 2019-10-16 NOTE — Consult Note (Addendum)
[facility] TeleNeurology Consult Services   Date of Service:   10/16/2019 17:31:49  Impression:     .  R51 - HA (Headache)  Comments/Sign-Out: Patient is a 74 yo RH female with a PMH of prior strokes (most recent 11/2018 right MCA stroke due to right M1 occlusion s/p IV TPA and thrombectomy, residual left sided mild weakness and ataxic gait requiring a walker), DM II, HTN, HLD who p/w a headache and subjective increased weakness on her left side. Patient is an inconsistent historian, LNK about 14:00.CTH reveals a chronic right MCA stroke. On exam she has mild LLE ataxia and bilateral patchy sensory changes. Findings c/w baseline documented deficitis. She reports she was able to walk with her walker today. Given lack of new disabling deficits on exam, with inconsistent LNK and clinical history Alteplase is not recommended. The potential risks outweigh the benefits. CTA head and neck, CTP are pending to exclude LVO. Lower clinical suspicion. Differential includes stroke/TIA, recrudesce of prior stroke symptoms, complex migraine less likely  Metrics: Last Known Well: 10/16/2019 14:00:00 [facility] Notification Time: 10/16/2019 17:31:49 Arrival Time: 10/16/2019 16:30:00 Stamp Time: 10/16/2019 17:31:49 Time First Login Attempt: 10/16/2019 17:32:00 Symptoms: headache, left sided weakness NIHSS Start Assessment Time: 10/16/2019 17:36:00 Patient is not a candidate for Alteplase/Activase. Patient was not deemed candidate for Alteplase/Activase thrombolytics because of Inconsistent historian and last known normal. No family members were with her today per patient to corroborate story. Current NIHSS of 2, c/w baseline documented prior deficits. No new disabling deficits appreciated on exam, confirmed with patient. .  CT head was reviewed and results were: Chronic right MCA stroke seen  Lower Likelihood of Large Vessel Occlusion but Following Stat Studies are Recommended  CTA  Head and Neck. CT Perfusion.   Radiologist was called back for review of advanced imaging on 10/16/2019 19:18:48 ED Physician notified of diagnostic impression and management plan on 10/16/2019 18:05:00  Our recommendations are outlined below.  Recommendations:     .  Activate Stroke Protocol Admission/Order Set     .  Stroke/Telemetry Floor     .  Neuro Checks     .  Bedside Swallow Eval     .  DVT Prophylaxis     .  IV Fluids, Normal Saline     .  Head of Bed 30 Degrees     .  Euglycemia and Avoid Hyperthermia (PRN Acetaminophen)     .  ASA 325mg  in ED, if no medical contraindication  Routine Consultation with [facility] for Follow up Care  Sign Out:     .  Discussed with Emergency Department Provider    ------------------------------------------------------------------------------  History of Present Illness: Patient is a 74 year old Female.  Patient was brought by EMS for symptoms of headache, left sided weakness  Patient is a 74 yo RH female with a PMH of prior strokes (most recent 11/2018 right MCA stroke due to right M1 occlusion s/p IV TPA and thrombectomy, residual left sided mild weakness and ataxic gait requiring a walker), DM II, HTN, HLD who p/w a headache and subjective increased weakness on her left side. Patient is an inconsistent historian, LNK about 14:00. She reports that she has had a severe headache for at least 3 months, recently increased. She was seen in a clinic earlier today for COVID testing. She was exposed to a family member with [place]. She reports that she feels weaker on her left side overall, more than normal. Difficult to quanity. She uses a  walker a above for ataxic gait and LLE weakness since her prior stroke. She denies new vision changes or speech changes. She has poor vision at baseline secondary to cataracts.   Past Medical History:     . Hypertension     . Hyperlipidemia     . Stroke  Anticoagulant use:  No  Antiplatelet use:  ASA 81mg     Examination: BP(124/80s), Pulse(Reviewed), Blood Glucose(95) 1A: Level of Consciousness - Alert; keenly responsive + 0 1B: Ask Month and Age - Both Questions Right + 0 1C: Blink Eyes & Squeeze Hands - Performs Both Tasks + 0 2: Test Horizontal Extraocular Movements - Normal + 0 3: Test Visual Fields - No Visual Loss + 0 4: Test Facial Palsy (Use Grimace if Obtunded) - Normal symmetry + 0 5A: Test Left Arm Motor Drift - No Drift for 10 Seconds + 0 5B: Test Right Arm Motor Drift - No Drift for 10 Seconds + 0 6A: Test Left Leg Motor Drift - No Drift for 5 Seconds + 0 6B: Test Right Leg Motor Drift - No Drift for 5 Seconds + 0 7: Test Limb Ataxia (FNF/Heel-Shin) - Ataxia in 1 Limb + 1 8: Test Sensation - Mild-Moderate Loss: Less Sharp/More Dull + 1 9: Test Language/Aphasia - Normal; No aphasia + 0 10: Test Dysarthria - Normal + 0 11: Test Extinction/Inattention - No abnormality + 0  NIHSS Score: 2  Pre-Morbid Modified Ranking Scale: 2 Points = Slight disability; unable to carry out all previous activities, but able to look after own affairs without assistance  Patient/Family was informed the Neurology Consult would occur via TeleHealth consult by way of interactive audio and video telecommunications and consented to receiving care in this manner.   Due to the immediate potential for life-threatening deterioration due to underlying acute neurologic illness, I spent 45 minutes providing critical care. This time includes time for face to face visit via telemedicine, review of medical records, imaging studies and discussion of findings with providers, the patient and/or family.   Dr Ruffin Frederick   [facility] [phone]  Case [id number]  Addendum: CTA head and neck, CTP reviewed.   Results.  IMPRESSION: 1. Positive for Right MCA M2 middle division near-occlusion/critical stenosis (series 515, image 8). The MCA branches distal to the this are  attenuated. However, CTP detects no infarct core and only 10 mL of Right MCA territory penumbra. Plus the plain CT appearance is that of chronic right MCA ischemia. Therefore, suspect this constellation reflects chronic more so than acute Right MCA vessel disease.  2. And furthermore, there are bilateral cervical ICA and ICA siphon hemodynamically significant stenoses: - Right ICA origin 60-65% stenosis. - Left carotid bifurcation RADIOGRAPHIC STRING SIGN STENOSIS. - Moderate bilateral ICA siphon stenosis due to calcified plaque.  3. Negative vertebral and posterior circulation. ____________________________ Patient is clinically asymptomatic for left ICA proximal string sign, however, this represents a critical stenosis.  Right M2 occlusion is likely chronic, from limited prior records available she had a right M1 occlusion . With small 90ml perfusion deficit. As noted below NIHSS of 2.   Transfer center called to d/w [facility] NIR/neurology team. At this time emergent NIR is not indicated. Rec STAT vascular consultation. Continue ASA, high dose statin recommended. Frequent neurochecks 1-2 are recommended, if decline please reconsult NIR/neurology. Case d/w ED MD.

## 2019-10-16 NOTE — ED Notes (Signed)
Pharmacy to come to bedside to help with TPA

## 2019-10-16 NOTE — ED Notes (Signed)
Updated daughter [name] on pt's condition.

## 2019-10-16 NOTE — Chronic Care Management (AMB) (Signed)
Chronic Care Management   Follow Up Note   10/16/2019 Name: Kendra [name] MRN: [id number] DOB: 03/18/1946  Referred by: Kendra College, NP (Inactive) Reason for referral : Chronic Care Management (Patient Phone Call)   Kendra [name] is a 74 y.o. year old female who is a primary care patient of Kendra College, NP (Inactive). The CCM team was consulted for assistance with chronic disease management and care coordination needs.  Kendra [name] has a medical history which includes but is not limited to recent CVA (11/2018), Type 2 Diabetes, hypertension, hyperlipidemia and COPD.   I reached out to Kendra [name] by phone today.   Review of patient status, including review of consultants reports, relevant laboratory and other test results, and collaboration with appropriate care team members and the patient's provider was performed as part of comprehensive patient evaluation and provision of chronic care management services.    Outpatient Encounter Medications as of 10/16/2019  Medication Sig Note  . albuterol (VENTOLIN HFA) 108 (90 Base) MCG/ACT inhaler INHALE 1 TO 2 PUFFS INTO THE LUNGS EVERY 6 HOURS AS NEEDED FOR WHEEZING OR SHORTNESS OF BREATH   . aspirin 81 MG EC tablet Take 1 tablet (81 mg total) by mouth daily. Swallow whole.   [name] atorvastatin (LIPITOR) 40 MG tablet TAKE 1 TABLET BY MOUTH ONCE DAILY   . buPROPion (WELLBUTRIN XL) 150 MG 24 hr tablet TAKE 1 TABLET BY MOUTH ONCE DAILY   . Cholecalciferol (VITAMIN D3) 50 MCG (2000 UT) capsule Take 1 capsule (2,000 Units total) by mouth daily.   . Fluticasone-Umeclidin-Vilant (TRELEGY ELLIPTA) 100-62.5-25 MCG/INH AEPB Inhale 1 puff into the lungs daily.   [name] lisinopril (ZESTRIL) 10 MG tablet Take 1 tablet (10 mg total) by mouth daily.   . metFORMIN (GLUCOPHAGE-XR) 500 MG 24 hr tablet Take 1 tablet (500 mg total) by mouth 2 (two) times daily before a meal.   . nicotine polacrilex (COMMIT) 4 MG lozenge Take 4 mg by mouth as  needed for smoking cessation.   . polyethylene glycol powder (GLYCOLAX/MIRALAX) 17 GM/SCOOP powder Take 17-34 g by mouth daily as needed for mild constipation or moderate constipation.   [name] acetaminophen (TYLENOL) 500 MG tablet Take 2 tablets (1,000 mg total) by mouth every 8 (eight) hours as needed for mild pain or moderate pain.   . baclofen (LIORESAL) 10 MG tablet TAKE 1 TABLET BY MOUTH ONCE DAILY AS NEEDED MUSCLE SPASMS (Patient not taking: Reported on 10/16/2019)   . Blood Glucose Monitoring Suppl (ONETOUCH VERIO) w/Device KIT 1 kit by Does not apply route 2 (two) times daily.   [name] glucose blood (ONETOUCH VERIO) test strip Use as instructed   . Insulin Pen Needle (BD PEN NEEDLE NANO U/F) 32G X 4 MM MISC USE WITH LANTUS AS DIRECTED   . Lancet Devices (ONE TOUCH DELICA LANCING DEV) MISC 1 Device by Does not apply route 2 (two) times daily.   . meloxicam (MOBIC) 15 MG tablet TAKE 1 TABLET BY MOUTH ONCE DAILY AS NEEDED (Patient not taking: Reported on 10/16/2019)   . OneTouch Delica Lancets 13K MISC 1 Device by Does not apply route 2 (two) times daily.   . [DISCONTINUED] bisacodyl (DULCOLAX) 5 MG EC tablet Take 5 mg by mouth daily as needed for moderate constipation. 01/30/2019: Reports using 1 tablet (5 mg)/day on 2 days in past week   No facility-administered encounter medications on file as of 10/16/2019.    Goals Addressed  This Visit's Progress   . PharmD-Medication Adherence       Current Barriers:  . Financial Barriers . Knowledge deficits related to coordination of her own care . Limited social support  Pharmacist Clinical Goal(s):   [name] Over the next 30 days, patient will demonstrate improved medication adherence as evidenced by verbalized understanding of prescribed medication regimen, assistance available, and patient report of adherence  Interventions: . Follow up with patient regarding COVID-19 exposure ? Per note from CCM Nurse Case Manager on 1/21 patient reported  recent exposure to COVID-19.  ? Kendra [name] denies any SOB or fever today. Reports occasional cough, but no more frequent or bothersome than her baseline with COPD ? Reports had COVID-19 testing today and is awaiting result ? Provide education regarding [id number] and COVID-19 vaccination . Follow up regarding blood pressure monitoring and control. ? Reports taking lisinopril 10 mg daily as directed ? Review recent blood pressure results (see below) ? Denies resting for ?5 minutes before checking BP this week - states that she forgot this direction. Patient writes herself a reminder note on BP log now . Counsel again on BP monitoring technique, including resting quietly >5 minutes before checking BP; avoiding nicotine, drinking caffeinated beverages or exercise within 30 minutes before measuring . Discuss impact of sodium/salt in diet for blood pressure control ? Reports continues to receive meals on wheels, but as she has noticed this food to be salty, has started to eat less frequently ? Patient reports she continues to limit sodium when preparing her own meals and denies adding salt to her food ? Reports planning to start with [employer] this Saturday, 1/30, to obtain more fresh food options, in place of meals on wheels . Counsel on importance of medication adherence ? Reports taking medication as directed from pill pack from [facility]. Encourage patient to label package with days of the week.  ? Encourage patient to restart using phone alarm reminders. ? Reports using maintenance, Trelegy, inhaler and albuterol inhaler as directed . Follow up with patient regarding smoking cessation.  ? Reports currently smoking ~1/2 cigarette/day  ? Reports continuing to use nicotine lozenges (? 1 lozenges/day) as needed as directed ? Provide encouragement  ? Patient has set quit date: 11/02/2019 ? Encourage patient to let family know so that they can provide support as well . Follow up regarding  blood sugar control ? Confirms taking metformin ER '500mg'$  twice daily as directed from pill packaging . Will mail patient BP monitoring education handout  . Collaborate with [name] - next pill pack scheduled to go out to patient between 2/3 and 2/5  Patient Self Care Activities:  . Patient takes medications as directed with aid of adherence tools. o Using pill packaging from [facility] o Patient to restart using daily phone alarm reminders (7 am & 8 pm) . Calls pharmacy for medication refills . Patient to attend scheduled medical appointments . Calls provider office for new concerns or questions  . Patient to check blood sugars and keep log . Patient to check blood pressure daily and keep log Date AM BP PM BP  23 - January 171/107, HR 75 159/93, HR 74  24 - January 143/81, HR 88 -  25 - January 177/107, HR 72 -  26 - January - 143/101, HR 82  27 - January 156/97, HR 69 141/80, HR 72  28 - January 147/105, HR 78  -  Blood pressure goal: < 130/80  Please see past updates related to this goal by clicking on the "Past Updates" button in the selected goal          Plan  Telephone follow up appointment with care management team member scheduled for: 2/2 at 1:30 pm  [name], PharmD, [place] [phone]

## 2019-10-17 ENCOUNTER — Inpatient Hospital Stay (HOSPITAL_COMMUNITY): Payer: Medicare Other

## 2019-10-17 ENCOUNTER — Inpatient Hospital Stay (HOSPITAL_COMMUNITY)
Admission: AD | Admit: 2019-10-17 | Discharge: 2019-10-23 | DRG: 038 | Disposition: A | Discharge status: Home Health | Payer: Medicare Other | Source: Other Acute Inpatient Hospital | Attending: Internal Medicine | Admitting: Internal Medicine

## 2019-10-17 DIAGNOSIS — Z818 Family history of other mental and behavioral disorders: Secondary | ICD-10-CM | POA: Diagnosis not present

## 2019-10-17 DIAGNOSIS — M069 Rheumatoid arthritis, unspecified: Secondary | ICD-10-CM | POA: Diagnosis present

## 2019-10-17 DIAGNOSIS — Z20822 Contact with and (suspected) exposure to covid-19: Secondary | ICD-10-CM | POA: Diagnosis present

## 2019-10-17 DIAGNOSIS — Z7982 Long term (current) use of aspirin: Secondary | ICD-10-CM

## 2019-10-17 DIAGNOSIS — E538 Deficiency of other specified B group vitamins: Secondary | ICD-10-CM | POA: Diagnosis present

## 2019-10-17 DIAGNOSIS — E1169 Type 2 diabetes mellitus with other specified complication: Secondary | ICD-10-CM | POA: Diagnosis present

## 2019-10-17 DIAGNOSIS — E1140 Type 2 diabetes mellitus with diabetic neuropathy, unspecified: Secondary | ICD-10-CM | POA: Diagnosis present

## 2019-10-17 DIAGNOSIS — G8194 Hemiplegia, unspecified affecting left nondominant side: Secondary | ICD-10-CM | POA: Diagnosis present

## 2019-10-17 DIAGNOSIS — Z8673 Personal history of transient ischemic attack (TIA), and cerebral infarction without residual deficits: Secondary | ICD-10-CM

## 2019-10-17 DIAGNOSIS — K219 Gastro-esophageal reflux disease without esophagitis: Secondary | ICD-10-CM | POA: Diagnosis present

## 2019-10-17 DIAGNOSIS — R29810 Facial weakness: Secondary | ICD-10-CM | POA: Diagnosis present

## 2019-10-17 DIAGNOSIS — G459 Transient cerebral ischemic attack, unspecified: Secondary | ICD-10-CM

## 2019-10-17 DIAGNOSIS — Z794 Long term (current) use of insulin: Secondary | ICD-10-CM

## 2019-10-17 DIAGNOSIS — E119 Type 2 diabetes mellitus without complications: Secondary | ICD-10-CM

## 2019-10-17 DIAGNOSIS — Z7951 Long term (current) use of inhaled steroids: Secondary | ICD-10-CM

## 2019-10-17 DIAGNOSIS — E785 Hyperlipidemia, unspecified: Secondary | ICD-10-CM | POA: Diagnosis present

## 2019-10-17 DIAGNOSIS — Z888 Allergy status to other drugs, medicaments and biological substances status: Secondary | ICD-10-CM | POA: Diagnosis not present

## 2019-10-17 DIAGNOSIS — F17210 Nicotine dependence, cigarettes, uncomplicated: Secondary | ICD-10-CM | POA: Diagnosis present

## 2019-10-17 DIAGNOSIS — G4700 Insomnia, unspecified: Secondary | ICD-10-CM | POA: Diagnosis present

## 2019-10-17 DIAGNOSIS — Z716 Tobacco abuse counseling: Secondary | ICD-10-CM | POA: Diagnosis not present

## 2019-10-17 DIAGNOSIS — F329 Major depressive disorder, single episode, unspecified: Secondary | ICD-10-CM | POA: Diagnosis present

## 2019-10-17 DIAGNOSIS — Z823 Family history of stroke: Secondary | ICD-10-CM | POA: Diagnosis not present

## 2019-10-17 DIAGNOSIS — D649 Anemia, unspecified: Secondary | ICD-10-CM | POA: Diagnosis present

## 2019-10-17 DIAGNOSIS — I6522 Occlusion and stenosis of left carotid artery: Secondary | ICD-10-CM

## 2019-10-17 DIAGNOSIS — I10 Essential (primary) hypertension: Secondary | ICD-10-CM | POA: Diagnosis present

## 2019-10-17 DIAGNOSIS — E118 Type 2 diabetes mellitus with unspecified complications: Secondary | ICD-10-CM

## 2019-10-17 DIAGNOSIS — R531 Weakness: Secondary | ICD-10-CM

## 2019-10-17 DIAGNOSIS — I6521 Occlusion and stenosis of right carotid artery: Secondary | ICD-10-CM | POA: Diagnosis present

## 2019-10-17 DIAGNOSIS — Z8349 Family history of other endocrine, nutritional and metabolic diseases: Secondary | ICD-10-CM

## 2019-10-17 DIAGNOSIS — I639 Cerebral infarction, unspecified: Secondary | ICD-10-CM | POA: Diagnosis not present

## 2019-10-17 DIAGNOSIS — J449 Chronic obstructive pulmonary disease, unspecified: Secondary | ICD-10-CM | POA: Diagnosis present

## 2019-10-17 DIAGNOSIS — I6523 Occlusion and stenosis of bilateral carotid arteries: Principal | ICD-10-CM | POA: Diagnosis present

## 2019-10-17 DIAGNOSIS — I340 Nonrheumatic mitral (valve) insufficiency: Secondary | ICD-10-CM

## 2019-10-17 DIAGNOSIS — Z79899 Other long term (current) drug therapy: Secondary | ICD-10-CM

## 2019-10-17 DIAGNOSIS — G4733 Obstructive sleep apnea (adult) (pediatric): Secondary | ICD-10-CM | POA: Diagnosis present

## 2019-10-17 DIAGNOSIS — F17200 Nicotine dependence, unspecified, uncomplicated: Secondary | ICD-10-CM | POA: Diagnosis present

## 2019-10-17 LAB — URINALYSIS, ROUTINE W REFLEX MICROSCOPIC
Bilirubin Urine: NEGATIVE
Glucose, UA: NEGATIVE mg/dL
Hgb urine dipstick: NEGATIVE
Ketones, ur: NEGATIVE mg/dL
Leukocytes,Ua: NEGATIVE
Nitrite: NEGATIVE
Protein, ur: NEGATIVE mg/dL
Specific Gravity, Urine: 1.012 (ref 1.005–1.030)
pH: 5 (ref 5.0–8.0)

## 2019-10-17 LAB — IRON AND TIBC
Iron: 59 ug/dL (ref 28–170)
Saturation Ratios: 20 % (ref 10.4–31.8)
TIBC: 298 ug/dL (ref 250–450)
UIBC: 239 ug/dL

## 2019-10-17 LAB — ECHOCARDIOGRAM COMPLETE

## 2019-10-17 LAB — LIPID PANEL
Cholesterol: 93 mg/dL (ref 0–200)
HDL: 40 mg/dL — ABNORMAL LOW (ref >40)
LDL Cholesterol: 37 mg/dL (ref 0–99)
Total CHOL/HDL Ratio: 2.3 RATIO
Triglycerides: 79 mg/dL (ref <150)
VLDL: 16 mg/dL (ref 0–40)

## 2019-10-17 LAB — FOLATE: Folate: 19.6 ng/mL (ref >5.9)

## 2019-10-17 LAB — GLUCOSE, CAPILLARY
Glucose-Capillary: 82 mg/dL (ref 70–99)
Glucose-Capillary: 74 mg/dL (ref 70–99)
Glucose-Capillary: 79 mg/dL (ref 70–99)
Glucose-Capillary: 96 mg/dL (ref 70–99)

## 2019-10-17 LAB — RETICULOCYTES
Immature Retic Fract: 6.1 % (ref 2.3–15.9)
RBC.: 3.48 MIL/uL — ABNORMAL LOW (ref 3.87–5.11)
Retic Count, Absolute: 31.3 10*3/uL (ref 19.0–186.0)
Retic Ct Pct: 0.9 % (ref 0.4–3.1)

## 2019-10-17 LAB — VITAMIN B12: Vitamin B-12: 170 pg/mL — ABNORMAL LOW (ref 180–914)

## 2019-10-17 LAB — FERRITIN: Ferritin: 74 ng/mL (ref 11–307)

## 2019-10-17 LAB — HEMOGLOBIN A1C
Hgb A1c MFr Bld: 6.2 % — ABNORMAL HIGH (ref 4.8–5.6)
Mean Plasma Glucose: 131.24 mg/dL

## 2019-10-17 MED ORDER — ACETAMINOPHEN 160 MG/5ML PO SOLN: 650.0000 mg | ORAL | Status: DC | PRN

## 2019-10-17 MED ORDER — ALBUTEROL SULFATE HFA 108 (90 BASE) MCG/ACT IN AERS: 1.0000 | INHALATION_SPRAY | Freq: Four times a day (QID) | RESPIRATORY_TRACT | Status: DC | PRN

## 2019-10-17 MED ORDER — BUPROPION HCL ER (XL) 150 MG PO TB24: 150.0000 mg | ORAL_TABLET | Freq: Every day | ORAL | Status: DC

## 2019-10-17 MED ORDER — ASPIRIN 325 MG PO TABS: 325.0000 mg | ORAL_TABLET | Freq: Every day | ORAL | Status: DC

## 2019-10-17 MED ORDER — CLOPIDOGREL BISULFATE 75 MG PO TABS
300.0000 mg | ORAL_TABLET | Freq: Once | ORAL | Status: AC
  Administered 2019-10-17: 300 mg via ORAL
  Filled 2019-10-17: qty 4

## 2019-10-17 MED ORDER — ACETAMINOPHEN 325 MG PO TABS
650.0000 mg | ORAL_TABLET | ORAL | Status: DC | PRN
  Administered 2019-10-23: 650 mg via ORAL
  Filled 2019-10-17: qty 2

## 2019-10-17 MED ORDER — INSULIN ASPART 100 UNIT/ML ~~LOC~~ SOLN
0.0000 [IU] | Freq: Three times a day (TID) | SUBCUTANEOUS | Status: DC
  Administered 2019-10-19: 1 [IU] via SUBCUTANEOUS
  Administered 2019-10-21 – 2019-10-22 (×2): 2 [IU] via SUBCUTANEOUS
  Administered 2019-10-22: 1 [IU] via SUBCUTANEOUS
  Administered 2019-10-22: 2 [IU] via SUBCUTANEOUS

## 2019-10-17 MED ORDER — ACETAMINOPHEN 650 MG RE SUPP: 650.0000 mg | RECTAL | Status: DC | PRN

## 2019-10-17 MED ORDER — ATORVASTATIN CALCIUM 20 MG PO TABS: 40.0000 mg | ORAL_TABLET | Freq: Every day | ORAL | Status: DC

## 2019-10-17 MED ORDER — STROKE: EARLY STAGES OF RECOVERY BOOK: Freq: Once | Status: DC

## 2019-10-17 MED ORDER — FLUTICASONE-UMECLIDIN-VILANT 100-62.5-25 MCG/INH IN AEPB: 1.0000 | INHALATION_SPRAY | Freq: Every day | RESPIRATORY_TRACT | Status: DC

## 2019-10-17 MED ORDER — INSULIN ASPART 100 UNIT/ML ~~LOC~~ SOLN: 0.0000 [IU] | Freq: Every day | SUBCUTANEOUS | Status: DC

## 2019-10-17 MED ORDER — SODIUM CHLORIDE 0.9 % IV SOLN: INTRAVENOUS | Status: DC

## 2019-10-17 MED ORDER — ATORVASTATIN CALCIUM 40 MG PO TABS
40.0000 mg | ORAL_TABLET | Freq: Every day | ORAL | Status: DC
  Administered 2019-10-17 – 2019-10-22 (×6): 40 mg via ORAL
  Filled 2019-10-17 (×6): qty 1

## 2019-10-17 MED ORDER — ASPIRIN EC 325 MG PO TBEC
325.0000 mg | DELAYED_RELEASE_TABLET | Freq: Every day | ORAL | Status: DC
  Administered 2019-10-17 – 2019-10-23 (×6): 325 mg via ORAL
  Filled 2019-10-17 (×6): qty 1

## 2019-10-17 MED ORDER — NICOTINE 7 MG/24HR TD PT24
7.0000 mg | MEDICATED_PATCH | Freq: Every day | TRANSDERMAL | Status: DC
  Administered 2019-10-17 – 2019-10-19 (×3): 7 mg via TRANSDERMAL
  Filled 2019-10-17 (×6): qty 1

## 2019-10-17 MED ORDER — CLOPIDOGREL BISULFATE 75 MG PO TABS
75.0000 mg | ORAL_TABLET | Freq: Every day | ORAL | Status: DC
  Administered 2019-10-18 – 2019-10-23 (×5): 75 mg via ORAL
  Filled 2019-10-17 (×5): qty 1

## 2019-10-17 NOTE — Progress Notes (Signed)
STROKE TEAM PROGRESS NOTE   INTERVAL HISTORY Pt lying in bed, stated that she felt much better today. Much improved from yesterday. MRI no acute infarct. She was again educated on smoking cessation. [facility] Dr. Donzetta Matters consulted and will consider right CEA.   Vitals:   10/17/19 0546 10/17/19 1000  BP: 125/80 106/63  Pulse: 93 71  Resp: 18   Temp: 98.4 F (36.9 C) 98.6 F (37 C)  TempSrc: Oral Oral  SpO2: 100% 100%    CBC:  Recent Labs  Lab 10/16/19 1717  WBC 6.3  NEUTROABS 3.6  HGB 11.4*  HCT 35.2*  MCV 93.9  PLT 123456    Basic Metabolic Panel:  Recent Labs  Lab 10/16/19 1717  NA 141  K 3.7  CL 106  CO2 25  GLUCOSE 102*  BUN 14  CREATININE 0.78  CALCIUM 9.6   Lipid Panel:     Component Value Date/Time   CHOL 93 10/17/2019 0606   CHOL 177 06/07/2015 1215   CHOL 141 09/02/2014 0440   TRIG 79 10/17/2019 0606   TRIG 155 09/02/2014 0440   HDL 40 (L) 10/17/2019 0606   HDL 56 06/07/2015 1215   HDL 37 (L) 09/02/2014 0440   CHOLHDL 2.3 10/17/2019 0606   VLDL 16 10/17/2019 0606   VLDL 31 09/02/2014 0440   LDLCALC 37 10/17/2019 0606   LDLCALC 43 08/20/2019 0802   LDLCALC 73 09/02/2014 0440   HgbA1c:  Lab Results  Component Value Date   HGBA1C 6.2 (H) 10/17/2019   Urine Drug Screen: No results found for: LABOPIA, COCAINSCRNUR, LABBENZ, AMPHETMU, THCU, LABBARB  Alcohol Level No results found for: ETH  IMAGING past 48 hours CT ANGIO HEAD W OR WO CONTRAST  Addendum Date: 10/16/2019   ADDENDUM REPORT: 10/16/2019 19:40 ADDENDUM: Study discussed by telephone with Dr. Kerman Passey in the ED on 10/16/2019 at 1933 hours. Electronically Signed   By: Genevie Ann M.D.   On: 10/16/2019 19:40   Result Date: 10/16/2019 CLINICAL DATA:  74 year old female code stroke presentation with left side weakness since 1530 or 1600 hours. EXAM: CT ANGIOGRAPHY HEAD AND NECK CT PERFUSION BRAIN TECHNIQUE: Multidetector CT imaging of the head and neck was performed using the standard protocol during  bolus administration of intravenous contrast. Multiplanar CT image reconstructions and MIPs were obtained to evaluate the vascular anatomy. Carotid stenosis measurements (when applicable) are obtained utilizing NASCET criteria, using the distal internal carotid diameter as the denominator. Multiphase CT imaging of the brain was performed following IV bolus contrast injection. Subsequent parametric perfusion maps were calculated using RAPID software. CONTRAST:  153mL OMNIPAQUE IOHEXOL 350 MG/ML SOLN COMPARISON:  Plain head CT 1724 hours today. FINDINGS: CT Brain Perfusion Findings: ASPECTS: 10, presumed chronic right MCA encephalomalacia. CBF (<30%) Volume: None, CBV appears increased in the area of abnormal T-max. Perfusion (Tmax>6.0s) volume: 19mL Mismatch Volume: 78mL Infarction Location:No infarct core, but ischemia localizes to the right frontal operculum and posterior right MCA territory. CT HEAD Brain: Repeat plain head CT images at 1847 hours. Abnormal cortical and white matter hypodensity in the posterior and middle right MCA territory appears stable from earlier. No associated hemorrhage or mass effect. As before there seems to be ex vacuo enlargement of the right lateral ventricle. Calvarium and skull base: Stable, negative. Paranasal sinuses: Visualized paranasal sinuses and mastoids are stable and well pneumatized. Orbits: No acute findings. CTA NECK Skeleton: Absent dentition. Prior cervical ACDF with solid arthrodesis. Advanced cervical spine degeneration elsewhere. No acute osseous abnormality identified.  Upper chest: Centrilobular and to a lesser extent paraseptal emphysema. Calcified aortic atherosclerosis. No superior mediastinal lymphadenopathy. Other neck: No acute findings. Aortic arch: 3 vessel arch configuration. Mild great vessel origin atherosclerosis. Right carotid system: Brachiocephalic artery plaque without stenosis. Normal right CCA origin. Tortuous proximal right CCA with a kinked  appearance at the thoracic inlet on series 508, image 119. Heavily calcified right ECA and ICA origins with additional posterior right ICA origin soft plaque. Right ICA origin stenosis is 60-65 % with respect to the distal vessel. See series 508, image 101 and series 512, image 49. The right ICA remains patent to the skull base. Left carotid system: Negative left CCA origin. Tortuous proximal left CCA. Heavily calcified left carotid bifurcation and right ICA origin. RADIOGRAPHIC STRING SIGN STENOSIS just proximal to the bifurcation on series 512, image 98. Less than 50% additional proximal right ICA stenosis. The right ICA remains patent to the skull base. Vertebral arteries: Mild proximal right subclavian calcified plaque without stenosis. Normal right vertebral artery origin. Right vertebral artery is patent to the skull base without plaque or stenosis. Normal proximal left subclavian artery. Normal left vertebral artery origin. Tortuous left V1 segment. The left vertebral is mildly non dominant and patent to the skull base without stenosis. CTA HEAD Posterior circulation: Mild bilateral V4 calcified plaque without stenosis. The right V4 segment is mildly dominant. Patent PICA origins and vertebrobasilar junction without stenosis. Patent basilar artery without stenosis. Normal SCA and PCA origins. Posterior communicating arteries are diminutive or absent. Bilateral PCA branches are within normal limits. Anterior circulation: Both ICA siphons are patent. On the left there is moderate calcified plaque and moderate stenosis just distal to the anterior genu (series 509, image 225. Normal left ophthalmic artery origin. On the right moderate to severe calcified plaque most pronounced in the cavernous segment with moderate stenosis at the petrous and cavernous junction. Normal right ophthalmic artery origin. Patent carotid termini with normal MCA and ACA origins. Anterior communicating artery and bilateral ACA branches  are within normal limits. Left MCA M1 segment and trifurcation are patent without stenosis. The left MCA trifurcation is ectatic but there is no discrete saccular aneurysm. Left MCA branches are within normal limits. Right MCA M1 segment and bifurcation are patent without stenosis. In the dominant middle M2 division there is a high-grade stenosis or short segment occlusion best demonstrated on series 515, image 8 with preserved distal enhancement although the downstream middle MCA branches are highly diminutive. Venous sinuses: Patent. Anatomic variants: Dominant right vertebral artery. Review of the MIP images confirms the above findings IMPRESSION: 1. Positive for Right MCA M2 middle division near-occlusion/critical stenosis (series 515, image 8). The MCA branches distal to the this are attenuated. However, CTP detects no infarct core and only 10 mL of Right MCA territory penumbra. Plus the plain CT appearance is that of chronic right MCA ischemia. Therefore, suspect this constellation reflects chronic more so than acute Right MCA vessel disease. 2. And furthermore, there are bilateral cervical ICA and ICA siphon hemodynamically significant stenoses: - Right ICA origin 60-65% stenosis. - Left carotid bifurcation RADIOGRAPHIC STRING SIGN STENOSIS. - Moderate bilateral ICA siphon stenosis due to calcified plaque. 3. Negative vertebral and posterior circulation. 4. Aortic Atherosclerosis (ICD10-I70.0) and Emphysema (ICD10-J43.9). Electronically Signed: By: Genevie Ann M.D. On: 10/16/2019 19:26   CT ANGIO NECK W OR WO CONTRAST  Addendum Date: 10/16/2019   ADDENDUM REPORT: 10/16/2019 19:40 ADDENDUM: Study discussed by telephone with Dr. Kerman Passey in the ED on  10/16/2019 at 1933 hours. Electronically Signed   By: Genevie Ann M.D.   On: 10/16/2019 19:40   Result Date: 10/16/2019 CLINICAL DATA:  74 year old female code stroke presentation with left side weakness since 1530 or 1600 hours. EXAM: CT ANGIOGRAPHY HEAD AND NECK  CT PERFUSION BRAIN TECHNIQUE: Multidetector CT imaging of the head and neck was performed using the standard protocol during bolus administration of intravenous contrast. Multiplanar CT image reconstructions and MIPs were obtained to evaluate the vascular anatomy. Carotid stenosis measurements (when applicable) are obtained utilizing NASCET criteria, using the distal internal carotid diameter as the denominator. Multiphase CT imaging of the brain was performed following IV bolus contrast injection. Subsequent parametric perfusion maps were calculated using RAPID software. CONTRAST:  184mL OMNIPAQUE IOHEXOL 350 MG/ML SOLN COMPARISON:  Plain head CT 1724 hours today. FINDINGS: CT Brain Perfusion Findings: ASPECTS: 10, presumed chronic right MCA encephalomalacia. CBF (<30%) Volume: None, CBV appears increased in the area of abnormal T-max. Perfusion (Tmax>6.0s) volume: 102mL Mismatch Volume: 38mL Infarction Location:No infarct core, but ischemia localizes to the right frontal operculum and posterior right MCA territory. CT HEAD Brain: Repeat plain head CT images at 1847 hours. Abnormal cortical and white matter hypodensity in the posterior and middle right MCA territory appears stable from earlier. No associated hemorrhage or mass effect. As before there seems to be ex vacuo enlargement of the right lateral ventricle. Calvarium and skull base: Stable, negative. Paranasal sinuses: Visualized paranasal sinuses and mastoids are stable and well pneumatized. Orbits: No acute findings. CTA NECK Skeleton: Absent dentition. Prior cervical ACDF with solid arthrodesis. Advanced cervical spine degeneration elsewhere. No acute osseous abnormality identified. Upper chest: Centrilobular and to a lesser extent paraseptal emphysema. Calcified aortic atherosclerosis. No superior mediastinal lymphadenopathy. Other neck: No acute findings. Aortic arch: 3 vessel arch configuration. Mild great vessel origin atherosclerosis. Right carotid  system: Brachiocephalic artery plaque without stenosis. Normal right CCA origin. Tortuous proximal right CCA with a kinked appearance at the thoracic inlet on series 508, image 119. Heavily calcified right ECA and ICA origins with additional posterior right ICA origin soft plaque. Right ICA origin stenosis is 60-65 % with respect to the distal vessel. See series 508, image 101 and series 512, image 49. The right ICA remains patent to the skull base. Left carotid system: Negative left CCA origin. Tortuous proximal left CCA. Heavily calcified left carotid bifurcation and right ICA origin. RADIOGRAPHIC STRING SIGN STENOSIS just proximal to the bifurcation on series 512, image 98. Less than 50% additional proximal right ICA stenosis. The right ICA remains patent to the skull base. Vertebral arteries: Mild proximal right subclavian calcified plaque without stenosis. Normal right vertebral artery origin. Right vertebral artery is patent to the skull base without plaque or stenosis. Normal proximal left subclavian artery. Normal left vertebral artery origin. Tortuous left V1 segment. The left vertebral is mildly non dominant and patent to the skull base without stenosis. CTA HEAD Posterior circulation: Mild bilateral V4 calcified plaque without stenosis. The right V4 segment is mildly dominant. Patent PICA origins and vertebrobasilar junction without stenosis. Patent basilar artery without stenosis. Normal SCA and PCA origins. Posterior communicating arteries are diminutive or absent. Bilateral PCA branches are within normal limits. Anterior circulation: Both ICA siphons are patent. On the left there is moderate calcified plaque and moderate stenosis just distal to the anterior genu (series 509, image 225. Normal left ophthalmic artery origin. On the right moderate to severe calcified plaque most pronounced in the cavernous segment with moderate stenosis  at the petrous and cavernous junction. Normal right ophthalmic artery  origin. Patent carotid termini with normal MCA and ACA origins. Anterior communicating artery and bilateral ACA branches are within normal limits. Left MCA M1 segment and trifurcation are patent without stenosis. The left MCA trifurcation is ectatic but there is no discrete saccular aneurysm. Left MCA branches are within normal limits. Right MCA M1 segment and bifurcation are patent without stenosis. In the dominant middle M2 division there is a high-grade stenosis or short segment occlusion best demonstrated on series 515, image 8 with preserved distal enhancement although the downstream middle MCA branches are highly diminutive. Venous sinuses: Patent. Anatomic variants: Dominant right vertebral artery. Review of the MIP images confirms the above findings IMPRESSION: 1. Positive for Right MCA M2 middle division near-occlusion/critical stenosis (series 515, image 8). The MCA branches distal to the this are attenuated. However, CTP detects no infarct core and only 10 mL of Right MCA territory penumbra. Plus the plain CT appearance is that of chronic right MCA ischemia. Therefore, suspect this constellation reflects chronic more so than acute Right MCA vessel disease. 2. And furthermore, there are bilateral cervical ICA and ICA siphon hemodynamically significant stenoses: - Right ICA origin 60-65% stenosis. - Left carotid bifurcation RADIOGRAPHIC STRING SIGN STENOSIS. - Moderate bilateral ICA siphon stenosis due to calcified plaque. 3. Negative vertebral and posterior circulation. 4. Aortic Atherosclerosis (ICD10-I70.0) and Emphysema (ICD10-J43.9). Electronically Signed: By: Genevie Ann M.D. On: 10/16/2019 19:26   MR BRAIN WO CONTRAST  Result Date: 10/17/2019 CLINICAL DATA:  Stroke presentation with left-sided weakness. EXAM: MRI HEAD WITHOUT CONTRAST TECHNIQUE: Multiplanar, multiecho pulse sequences of the brain and surrounding structures were obtained without intravenous contrast. COMPARISON:  CT and CTA yesterday.  FINDINGS: Brain: No acute infarction, hemorrhage, hydrocephalus, extra-axial collection or mass lesion. Large area of remote right MCA territory infarct affecting cortex and white matter. The right frontal lobe, parietal lobe, insula, anterior temporal lobe are all affected. Chronic small vessel ischemia in the cerebral white matter and pons, moderate to extensive in the pons. Cerebral volume loss without specific pattern. No chronic blood products. Vascular: Normal flow voids.  CT was performed yesterday. Skull and upper cervical spine: Normal marrow signal Sinuses/Orbits: Negative IMPRESSION: 1. No acute finding. 2. Large remote right MCA distribution infarct. 3. Chronic small vessel ischemia most notably affecting the pons. Electronically Signed   By: Monte Fantasia M.D.   On: 10/17/2019 08:23   CT CEREBRAL PERFUSION W CONTRAST  Addendum Date: 10/16/2019   ADDENDUM REPORT: 10/16/2019 19:40 ADDENDUM: Study discussed by telephone with Dr. Kerman Passey in the ED on 10/16/2019 at 1933 hours. Electronically Signed   By: Genevie Ann M.D.   On: 10/16/2019 19:40   Result Date: 10/16/2019 CLINICAL DATA:  74 year old female code stroke presentation with left side weakness since 1530 or 1600 hours. EXAM: CT ANGIOGRAPHY HEAD AND NECK CT PERFUSION BRAIN TECHNIQUE: Multidetector CT imaging of the head and neck was performed using the standard protocol during bolus administration of intravenous contrast. Multiplanar CT image reconstructions and MIPs were obtained to evaluate the vascular anatomy. Carotid stenosis measurements (when applicable) are obtained utilizing NASCET criteria, using the distal internal carotid diameter as the denominator. Multiphase CT imaging of the brain was performed following IV bolus contrast injection. Subsequent parametric perfusion maps were calculated using RAPID software. CONTRAST:  161mL OMNIPAQUE IOHEXOL 350 MG/ML SOLN COMPARISON:  Plain head CT 1724 hours today. FINDINGS: CT Brain Perfusion  Findings: ASPECTS: 10, presumed chronic right MCA encephalomalacia. CBF (<  30%) Volume: None, CBV appears increased in the area of abnormal T-max. Perfusion (Tmax>6.0s) volume: 53mL Mismatch Volume: 90mL Infarction Location:No infarct core, but ischemia localizes to the right frontal operculum and posterior right MCA territory. CT HEAD Brain: Repeat plain head CT images at 1847 hours. Abnormal cortical and white matter hypodensity in the posterior and middle right MCA territory appears stable from earlier. No associated hemorrhage or mass effect. As before there seems to be ex vacuo enlargement of the right lateral ventricle. Calvarium and skull base: Stable, negative. Paranasal sinuses: Visualized paranasal sinuses and mastoids are stable and well pneumatized. Orbits: No acute findings. CTA NECK Skeleton: Absent dentition. Prior cervical ACDF with solid arthrodesis. Advanced cervical spine degeneration elsewhere. No acute osseous abnormality identified. Upper chest: Centrilobular and to a lesser extent paraseptal emphysema. Calcified aortic atherosclerosis. No superior mediastinal lymphadenopathy. Other neck: No acute findings. Aortic arch: 3 vessel arch configuration. Mild great vessel origin atherosclerosis. Right carotid system: Brachiocephalic artery plaque without stenosis. Normal right CCA origin. Tortuous proximal right CCA with a kinked appearance at the thoracic inlet on series 508, image 119. Heavily calcified right ECA and ICA origins with additional posterior right ICA origin soft plaque. Right ICA origin stenosis is 60-65 % with respect to the distal vessel. See series 508, image 101 and series 512, image 49. The right ICA remains patent to the skull base. Left carotid system: Negative left CCA origin. Tortuous proximal left CCA. Heavily calcified left carotid bifurcation and right ICA origin. RADIOGRAPHIC STRING SIGN STENOSIS just proximal to the bifurcation on series 512, image 98. Less than 50%  additional proximal right ICA stenosis. The right ICA remains patent to the skull base. Vertebral arteries: Mild proximal right subclavian calcified plaque without stenosis. Normal right vertebral artery origin. Right vertebral artery is patent to the skull base without plaque or stenosis. Normal proximal left subclavian artery. Normal left vertebral artery origin. Tortuous left V1 segment. The left vertebral is mildly non dominant and patent to the skull base without stenosis. CTA HEAD Posterior circulation: Mild bilateral V4 calcified plaque without stenosis. The right V4 segment is mildly dominant. Patent PICA origins and vertebrobasilar junction without stenosis. Patent basilar artery without stenosis. Normal SCA and PCA origins. Posterior communicating arteries are diminutive or absent. Bilateral PCA branches are within normal limits. Anterior circulation: Both ICA siphons are patent. On the left there is moderate calcified plaque and moderate stenosis just distal to the anterior genu (series 509, image 225. Normal left ophthalmic artery origin. On the right moderate to severe calcified plaque most pronounced in the cavernous segment with moderate stenosis at the petrous and cavernous junction. Normal right ophthalmic artery origin. Patent carotid termini with normal MCA and ACA origins. Anterior communicating artery and bilateral ACA branches are within normal limits. Left MCA M1 segment and trifurcation are patent without stenosis. The left MCA trifurcation is ectatic but there is no discrete saccular aneurysm. Left MCA branches are within normal limits. Right MCA M1 segment and bifurcation are patent without stenosis. In the dominant middle M2 division there is a high-grade stenosis or short segment occlusion best demonstrated on series 515, image 8 with preserved distal enhancement although the downstream middle MCA branches are highly diminutive. Venous sinuses: Patent. Anatomic variants: Dominant right  vertebral artery. Review of the MIP images confirms the above findings IMPRESSION: 1. Positive for Right MCA M2 middle division near-occlusion/critical stenosis (series 515, image 8). The MCA branches distal to the this are attenuated. However, CTP detects no infarct  core and only 10 mL of Right MCA territory penumbra. Plus the plain CT appearance is that of chronic right MCA ischemia. Therefore, suspect this constellation reflects chronic more so than acute Right MCA vessel disease. 2. And furthermore, there are bilateral cervical ICA and ICA siphon hemodynamically significant stenoses: - Right ICA origin 60-65% stenosis. - Left carotid bifurcation RADIOGRAPHIC STRING SIGN STENOSIS. - Moderate bilateral ICA siphon stenosis due to calcified plaque. 3. Negative vertebral and posterior circulation. 4. Aortic Atherosclerosis (ICD10-I70.0) and Emphysema (ICD10-J43.9). Electronically Signed: By: Genevie Ann M.D. On: 10/16/2019 19:26   CT HEAD CODE STROKE WO CONTRAST  Addendum Date: 10/16/2019   ADDENDUM REPORT: 10/16/2019 17:49 ADDENDUM: Study discussed by telephone with Dr. Kerman Passey on 10/16/2019 at 1743 hours. Electronically Signed   By: Genevie Ann M.D.   On: 10/16/2019 17:49   Result Date: 10/16/2019 CLINICAL DATA:  Code stroke. 74 year old female with left side weakness since around 15 30 or 1600 hours today. EXAM: CT HEAD WITHOUT CONTRAST TECHNIQUE: Contiguous axial images were obtained from the base of the skull through the vertex without intravenous contrast. COMPARISON:  Head CT 04/24/2015. Brain MRI 09/02/2014. FINDINGS: Brain: Confluent new right frontal lobe white matter hypodensity, but associated with ex vacuo enlargement of the right lateral ventricle. There is new chronic appearing encephalomalacia of the right insula and operculum. Progressed cerebral white matter and deep gray matter hypodensity elsewhere in both hemispheres since 2016. No acute intracranial hemorrhage identified. No midline shift, mass  effect, or evidence of intracranial mass lesion. Vascular: Calcified atherosclerosis at the skull base. No suspicious intracranial vascular hyperdensity. Skull: No acute osseous abnormality identified. Sinuses/Orbits: Visualized paranasal sinuses and mastoids are stable and well pneumatized. Other: No acute orbit or scalp soft tissue findings. ASPECTS [facility] Stroke Program Early CT Score) Total score (0-10 with 10 being normal): 10 (presuming chronic encephalomalacia in the right frontal lobe). IMPRESSION: 1. New since 2016 but chronic appearing ischemia in the right MCA territory. Underlying advanced chronic small vessel disease. 2. No or definite acute cortically based infarct. No acute intracranial hemorrhage. ASPECTS 10. Electronically Signed: By: Genevie Ann M.D. On: 10/16/2019 17:38    PHYSICAL EXAM  Temp:  [97.7 F (36.5 C)-98.7 F (37.1 C)] 98.1 F (36.7 C) (01/29 1559) Pulse Rate:  [65-93] 70 (01/29 1559) Resp:  [15-24] 18 (01/29 1559) BP: (106-140)/(63-83) 125/65 (01/29 1559) SpO2:  [97 %-100 %] 100 % (01/29 1559) Weight:  [56.8 kg] 56.8 kg (01/28 2059)  General - Well nourished, well developed, in no apparent distress.  Ophthalmologic - fundi not visualized due to noncooperation.  Cardiovascular - Regular rhythm and rate.  Mental Status -  Level of arousal and orientation to time, place, and person were intact. Language including expression, naming, repetition, comprehension was assessed and found intact. Mild dysarthria due to poor denture  Cranial Nerves II - XII - II - Visual field intact OU. III, IV, VI - Extraocular movements intact. V - Facial sensation decreased on the left. VII - Facial movement intact bilaterally. VIII - Hearing & vestibular intact bilaterally. X - Palate elevates symmetrically. Mild dysarthria due to poor denture XI - Chin turning & shoulder shrug intact bilaterally. XII - Tongue protrusion intact.  Motor Strength - The patient's strength was  symmetrical in all extremities except LUE 4/5 proximal and distal with dexterity difficulty and pronator drift was Persent.  Bulk was normal and fasciculations were absent.   Motor Tone - Muscle tone was assessed at the neck and appendages and  was normal.  Reflexes - The patient's reflexes were symmetrical in all extremities and she had no pathological reflexes.  Sensory - Light touch, temperature/pinprick were assessed and were decreased on the left.    Coordination - The patient had normal movements in the hands with no ataxia or dysmetria.  Tremor was absent.  Gait and Station - deferred.   ASSESSMENT/PLAN Ms. HEYLEE GREENLIEF is a 74 y.o. female with history of hypertension, hyperlipidemia, diabetes mellitus type 2, prior CVA with most recent stroke in March 2020 secondary to right M1 occlusion status post IV TPA and thrombectomy and residual left hemiparesis presenting to [facility] ED with L sided weakness.   R brain TIA likely due to right ICA high grade stenosis    Code Stroke CT head No acute abnormality. No ICH. New since 2016 but chronic R MCA infarct. Small vessel disease. ASPECTS 10.     CTA head & CTA neck R M2 near occlusive/critical stenosis. R ICA origin 60-65% stenosis. L ICA bifurcation string sign. Moderate B ICA siphon stenosis d/t calcified plaque. Aortic atherosclerosis. Emphysema.   CT perfusion no core infarct, 69mL penumbra  MRI  No acute finding. Old R MCA infarct. Small vessel disease, especially of the pons  Carotid Doppler  R 40-59%, L ICA 60-79% stenoses  2D Echo EF 60-65%  LDL 37  HgbA1c 6.2  SCDs for VTE prophylaxis  aspirin 81 mg daily prior to admission, now on aspirin 325 mg daily and clopidogrel 75 mg daily.   Therapy recommendations:  pending   Disposition:  pending   History Stroke  March 2020 [facility] - L sided weakness. S/p tPA & IR w/ TICI3 recanalization of R distal M1 moderate stenosis.  A1c 6.6, LDL 52, CTA  head and neck bilateral ICA 30-40% stenosis.  EF 55 to 60%.  HH PT, OT. Asa 81. Lipitor 80. Nicotine and Ziopatch ordered at f/u.  04/2019 follow-up with [facility] neurology, continue aspirin and Lipitor.  Carotid stenosis  CTA neck R ICA 60 to 65% stenosis, L cervical ICA radiographic string sign  [facility] consulted  Carotid Doppler  R 40-59%, L ICA 60-79% stenoses  Discussed with Dr. Donzetta Matters, agree with right CEA first for right symptomatic carotid stenosis but left ICA asymptomatic high grade stenosis also needs to be addressed in the near future.  Hypertension  Stable . BP goal 130-150 give carotid stenosis   Hyperlipidemia  Home meds:  lipitor 40, resumed in hospital  LDL 37, goal < 70  Continue statin at discharge  Diabetes type II Controlled  HgbA1c 6.2, goal < 7.0  CBGs  SSI  Tobacco abuse  Current smoker  Smoking cessation counseling provided  Nicotine patch   Pt is willing to quit  Other Stroke Risk Factors  Advanced age  Family hx stroke (father)  Obstructive sleep apnea, not on CPAP at home  Other Active Problems  GERD  COPD  RA  Hospital day # 0  Neurology will sign off. Please call with questions. Pt will follow up with [facility] stroke clinic Ms. Joseph Berkshire in about 4 weeks. Thanks for the consult.  [name], MD PhD Stroke Neurology 10/17/2019 7:34 PM   To contact Stroke Continuity provider, please refer to [email or web address] After hours, contact General Neurology

## 2019-10-17 NOTE — CV Procedure (Signed)
2D echo attempted, but physical therapy is with patient. Will try echo later

## 2019-10-17 NOTE — Consult Note (Signed)
Requesting Physician: Dr. Marlowe Sax    Chief Complaint: Worsening left-sided weakness  History obtained from: Patient and Chart    HPI:                                                                                                                                       Kendra [name] is a 74 y.o. female with past medical history of hypertension, hyperlipidemia, diabetes mellitus type 2, prior CVA with most recent stroke in March 2020 secondary to right M1 occlusion status post IV TPA and thrombectomy and residual left hemiparesis presents to the ED at [facility] with worsening weakness on the left side.  The patient states that she was doing fine but suddenly around 3 PM she had increased weakness on the left side associated with a headache.  She normally can walk however was unable to do so.  However patient also noted to be weaker for approximately a week and was tested for COVID-19.  She presented [facility] emergency department by EMS.  She was evaluated by tele-neurology who felt the patient was not a candidate for TPA ( "Given lack of new disabling deficits on exam, with inconsistent LNK and clinical history Alteplase is not recommended").  CT head did not show acute findings, redemonstrated chronic right MCA infarct.  CTA showed an occluded right M2 and the perfusion deficit of 10 cc on CTP with no core.  CT angiogram also showed a severely stenosed left ICA with string sign.  Teleneurologist discussed case with Neurology at [facility] (myself), patient not a candidate for mechanical thrombectomy as M2 occlusion/stenosis may be chronic and low NIH stroke scale.  Patient was transferred to [facility] given the complexity of her case,  close monitoring and evaluation of left ICA.   Date last known well: Unclear, possibly 10/16/2019 Time last known well: Possibly 2 PM tPA Given: No NIHSS: 2 Baseline MRS 0  "March 20 the patient presented to [name] ED with left sided weakness, NIHSS on arrival was 20. She  was within the window and deemed a candidate for IV TPA. CTA revealed a right M1 occlusion, she underwent successful thrombectomy with TICI 3 recanalization. Her LDL was 52 and HgbA1C was 6.6%. CTA neck did not reveal any hemodynamically significant stenosis. Echo revealed preserved EF with no evidence of intracardiac thrombus. She was monitored on telemetry throughout her hospital stay which was approximately 4 days and there was no evidence of underlying arrhythmia such as atrial fibrillation. By the time of discharge her NIHSS had improved to 2 (mild residual left hemiparesis) and she was discharged home with home PT/OT. She was discharged on ASA 81 mg and Lipitor 80 mg for secondary stroke prevention  At the last appointment, she reported having mild residual left sided weakness. She continued to smoke, but reported she was interested in quitting. Nicotine patches were prescribed to the patient. A ziopatch was ordered."  Past Medical History:  Diagnosis Date  . Back pain   . Cataract   . Cervical disc disorder   . Chest pain   . COPD (chronic obstructive pulmonary disease) ([place])   . Depression   . Diabetes ([place])   . Diabetic neuropathy ([place])   . GERD (gastroesophageal reflux disease)   . Hyperlipidemia   . Insomnia   . Neuropathy   . Osteoarthritis   . Osteopenia   . Reflux   . Rheumatoid arthritis ([place])   . Sleep apnea    no CPAP and no suggestion that she needed one per pt  . Stroke ([place]) 2015   and again 2017  - Weakness in left leg, staggers w/ walking  . Tenosynovitis     Past Surgical History:  Procedure Laterality Date  . BREAST BIOPSY    . NECK SURGERY    . OVARIAN CYST REMOVAL      Family History  Problem Relation Age of Onset  . Stroke Father   . Depression Father   . Diabetes Mother   . Hyperlipidemia Mother   . Drug abuse Sister   . Heart murmur Son   . Drug abuse Sister   . Heart murmur Son   . Heart murmur Son    Social History:  reports that she  has been smoking cigarettes. She has been smoking about 0.25 packs per day. She uses smokeless tobacco. She reports that she does not drink alcohol or use drugs.  Allergies:  Allergies  Allergen Reactions  . Citalopram Nausea And Vomiting    Medications:                                                                                                                        I reviewed home medications   ROS:                                                                                                                                     14 systems reviewed and negative except above    Examination:  General: Appears well-developed  Psych: Affect appropriate to situation Eyes: No scleral injection HENT: No OP obstrucion Head: Normocephalic.  Cardiovascular: Normal rate and regular rhythm. Respiratory: Effort normal and breath sounds normal to anterior ascultation GI: Soft.  No distension. There is no tenderness.  Skin: WDI    Neurological Examination Mental Status: Alert, oriented, thought content appropriate.  Speech fluent without evidence of aphasia. Able to follow 3 step commands without difficulty. Cranial Nerves: II: Visual fields grossly normal,  III,IV, VI: ptosis not present, extra-ocular motions intact bilaterally, pupils equal, round, reactive to light and accommodation V,VII: smile symmetric, facial light touch sensation normal bilaterally VIII: hearing normal bilaterally IX,X: uvula rises symmetrically XI: bilateral shoulder shrug XII: midline tongue extension Motor: Right : Upper extremity   5/5    Left:     Upper extremity   4+/5  Lower extremity   5/5     Lower extremity   4/5 Tone and bulk:normal tone throughout; no atrophy noted Sensory: Reduced to light touch on the left side compared to the right Plantars: Right: downgoing   Left:  downgoing Cerebellar: normal finger-to-nose      Lab Results: Basic Metabolic Panel: Recent Labs  Lab 10/16/19 1717  NA 141  K 3.7  CL 106  CO2 25  GLUCOSE 102*  BUN 14  CREATININE 0.78  CALCIUM 9.6    CBC: Recent Labs  Lab 10/16/19 1717  WBC 6.3  NEUTROABS 3.6  HGB 11.4*  HCT 35.2*  MCV 93.9  PLT 230    Coagulation Studies: Recent Labs    10/16/19 1717  LABPROT 13.9  INR 1.1    Imaging: CT ANGIO HEAD W OR WO CONTRAST  Addendum Date: 10/16/2019   ADDENDUM REPORT: 10/16/2019 19:40 ADDENDUM: Study discussed by telephone with Dr. Kerman Passey in the ED on 10/16/2019 at 1933 hours. Electronically Signed   By: Genevie Ann M.D.   On: 10/16/2019 19:40   Result Date: 10/16/2019 CLINICAL DATA:  74 year old female code stroke presentation with left side weakness since 1530 or 1600 hours. EXAM: CT ANGIOGRAPHY HEAD AND NECK CT PERFUSION BRAIN TECHNIQUE: Multidetector CT imaging of the head and neck was performed using the standard protocol during bolus administration of intravenous contrast. Multiplanar CT image reconstructions and MIPs were obtained to evaluate the vascular anatomy. Carotid stenosis measurements (when applicable) are obtained utilizing NASCET criteria, using the distal internal carotid diameter as the denominator. Multiphase CT imaging of the brain was performed following IV bolus contrast injection. Subsequent parametric perfusion maps were calculated using RAPID software. CONTRAST:  176mL OMNIPAQUE IOHEXOL 350 MG/ML SOLN COMPARISON:  Plain head CT 1724 hours today. FINDINGS: CT Brain Perfusion Findings: ASPECTS: 10, presumed chronic right MCA encephalomalacia. CBF (<30%) Volume: None, CBV appears increased in the area of abnormal T-max. Perfusion (Tmax>6.0s) volume: 59mL Mismatch Volume: 91mL Infarction Location:No infarct core, but ischemia localizes to the right frontal operculum and posterior right MCA territory. CT HEAD Brain: Repeat plain head CT images at 1847  hours. Abnormal cortical and white matter hypodensity in the posterior and middle right MCA territory appears stable from earlier. No associated hemorrhage or mass effect. As before there seems to be ex vacuo enlargement of the right lateral ventricle. Calvarium and skull base: Stable, negative. Paranasal sinuses: Visualized paranasal sinuses and mastoids are stable and well pneumatized. Orbits: No acute findings. CTA NECK Skeleton: Absent dentition. Prior cervical ACDF with solid arthrodesis. Advanced cervical spine degeneration elsewhere. No acute osseous abnormality identified. Upper chest: Centrilobular and to a  lesser extent paraseptal emphysema. Calcified aortic atherosclerosis. No superior mediastinal lymphadenopathy. Other neck: No acute findings. Aortic arch: 3 vessel arch configuration. Mild great vessel origin atherosclerosis. Right carotid system: Brachiocephalic artery plaque without stenosis. Normal right CCA origin. Tortuous proximal right CCA with a kinked appearance at the thoracic inlet on series 508, image 119. Heavily calcified right ECA and ICA origins with additional posterior right ICA origin soft plaque. Right ICA origin stenosis is 60-65 % with respect to the distal vessel. See series 508, image 101 and series 512, image 49. The right ICA remains patent to the skull base. Left carotid system: Negative left CCA origin. Tortuous proximal left CCA. Heavily calcified left carotid bifurcation and right ICA origin. RADIOGRAPHIC STRING SIGN STENOSIS just proximal to the bifurcation on series 512, image 98. Less than 50% additional proximal right ICA stenosis. The right ICA remains patent to the skull base. Vertebral arteries: Mild proximal right subclavian calcified plaque without stenosis. Normal right vertebral artery origin. Right vertebral artery is patent to the skull base without plaque or stenosis. Normal proximal left subclavian artery. Normal left vertebral artery origin. Tortuous left V1  segment. The left vertebral is mildly non dominant and patent to the skull base without stenosis. CTA HEAD Posterior circulation: Mild bilateral V4 calcified plaque without stenosis. The right V4 segment is mildly dominant. Patent PICA origins and vertebrobasilar junction without stenosis. Patent basilar artery without stenosis. Normal SCA and PCA origins. Posterior communicating arteries are diminutive or absent. Bilateral PCA branches are within normal limits. Anterior circulation: Both ICA siphons are patent. On the left there is moderate calcified plaque and moderate stenosis just distal to the anterior genu (series 509, image 225. Normal left ophthalmic artery origin. On the right moderate to severe calcified plaque most pronounced in the cavernous segment with moderate stenosis at the petrous and cavernous junction. Normal right ophthalmic artery origin. Patent carotid termini with normal MCA and ACA origins. Anterior communicating artery and bilateral ACA branches are within normal limits. Left MCA M1 segment and trifurcation are patent without stenosis. The left MCA trifurcation is ectatic but there is no discrete saccular aneurysm. Left MCA branches are within normal limits. Right MCA M1 segment and bifurcation are patent without stenosis. In the dominant middle M2 division there is a high-grade stenosis or short segment occlusion best demonstrated on series 515, image 8 with preserved distal enhancement although the downstream middle MCA branches are highly diminutive. Venous sinuses: Patent. Anatomic variants: Dominant right vertebral artery. Review of the MIP images confirms the above findings IMPRESSION: 1. Positive for Right MCA M2 middle division near-occlusion/critical stenosis (series 515, image 8). The MCA branches distal to the this are attenuated. However, CTP detects no infarct core and only 10 mL of Right MCA territory penumbra. Plus the plain CT appearance is that of chronic right MCA  ischemia. Therefore, suspect this constellation reflects chronic more so than acute Right MCA vessel disease. 2. And furthermore, there are bilateral cervical ICA and ICA siphon hemodynamically significant stenoses: - Right ICA origin 60-65% stenosis. - Left carotid bifurcation RADIOGRAPHIC STRING SIGN STENOSIS. - Moderate bilateral ICA siphon stenosis due to calcified plaque. 3. Negative vertebral and posterior circulation. 4. Aortic Atherosclerosis (ICD10-I70.0) and Emphysema (ICD10-J43.9). Electronically Signed: By: Genevie Ann M.D. On: 10/16/2019 19:26   CT ANGIO NECK W OR WO CONTRAST  Addendum Date: 10/16/2019   ADDENDUM REPORT: 10/16/2019 19:40 ADDENDUM: Study discussed by telephone with Dr. Kerman Passey in the ED on 10/16/2019 at 1933 hours. Electronically Signed  By: Genevie Ann M.D.   On: 10/16/2019 19:40   Result Date: 10/16/2019 CLINICAL DATA:  74 year old female code stroke presentation with left side weakness since 1530 or 1600 hours. EXAM: CT ANGIOGRAPHY HEAD AND NECK CT PERFUSION BRAIN TECHNIQUE: Multidetector CT imaging of the head and neck was performed using the standard protocol during bolus administration of intravenous contrast. Multiplanar CT image reconstructions and MIPs were obtained to evaluate the vascular anatomy. Carotid stenosis measurements (when applicable) are obtained utilizing NASCET criteria, using the distal internal carotid diameter as the denominator. Multiphase CT imaging of the brain was performed following IV bolus contrast injection. Subsequent parametric perfusion maps were calculated using RAPID software. CONTRAST:  156mL OMNIPAQUE IOHEXOL 350 MG/ML SOLN COMPARISON:  Plain head CT 1724 hours today. FINDINGS: CT Brain Perfusion Findings: ASPECTS: 10, presumed chronic right MCA encephalomalacia. CBF (<30%) Volume: None, CBV appears increased in the area of abnormal T-max. Perfusion (Tmax>6.0s) volume: 71mL Mismatch Volume: 23mL Infarction Location:No infarct core, but ischemia  localizes to the right frontal operculum and posterior right MCA territory. CT HEAD Brain: Repeat plain head CT images at 1847 hours. Abnormal cortical and white matter hypodensity in the posterior and middle right MCA territory appears stable from earlier. No associated hemorrhage or mass effect. As before there seems to be ex vacuo enlargement of the right lateral ventricle. Calvarium and skull base: Stable, negative. Paranasal sinuses: Visualized paranasal sinuses and mastoids are stable and well pneumatized. Orbits: No acute findings. CTA NECK Skeleton: Absent dentition. Prior cervical ACDF with solid arthrodesis. Advanced cervical spine degeneration elsewhere. No acute osseous abnormality identified. Upper chest: Centrilobular and to a lesser extent paraseptal emphysema. Calcified aortic atherosclerosis. No superior mediastinal lymphadenopathy. Other neck: No acute findings. Aortic arch: 3 vessel arch configuration. Mild great vessel origin atherosclerosis. Right carotid system: Brachiocephalic artery plaque without stenosis. Normal right CCA origin. Tortuous proximal right CCA with a kinked appearance at the thoracic inlet on series 508, image 119. Heavily calcified right ECA and ICA origins with additional posterior right ICA origin soft plaque. Right ICA origin stenosis is 60-65 % with respect to the distal vessel. See series 508, image 101 and series 512, image 49. The right ICA remains patent to the skull base. Left carotid system: Negative left CCA origin. Tortuous proximal left CCA. Heavily calcified left carotid bifurcation and right ICA origin. RADIOGRAPHIC STRING SIGN STENOSIS just proximal to the bifurcation on series 512, image 98. Less than 50% additional proximal right ICA stenosis. The right ICA remains patent to the skull base. Vertebral arteries: Mild proximal right subclavian calcified plaque without stenosis. Normal right vertebral artery origin. Right vertebral artery is patent to the skull  base without plaque or stenosis. Normal proximal left subclavian artery. Normal left vertebral artery origin. Tortuous left V1 segment. The left vertebral is mildly non dominant and patent to the skull base without stenosis. CTA HEAD Posterior circulation: Mild bilateral V4 calcified plaque without stenosis. The right V4 segment is mildly dominant. Patent PICA origins and vertebrobasilar junction without stenosis. Patent basilar artery without stenosis. Normal SCA and PCA origins. Posterior communicating arteries are diminutive or absent. Bilateral PCA branches are within normal limits. Anterior circulation: Both ICA siphons are patent. On the left there is moderate calcified plaque and moderate stenosis just distal to the anterior genu (series 509, image 225. Normal left ophthalmic artery origin. On the right moderate to severe calcified plaque most pronounced in the cavernous segment with moderate stenosis at the petrous and cavernous junction. Normal right  ophthalmic artery origin. Patent carotid termini with normal MCA and ACA origins. Anterior communicating artery and bilateral ACA branches are within normal limits. Left MCA M1 segment and trifurcation are patent without stenosis. The left MCA trifurcation is ectatic but there is no discrete saccular aneurysm. Left MCA branches are within normal limits. Right MCA M1 segment and bifurcation are patent without stenosis. In the dominant middle M2 division there is a high-grade stenosis or short segment occlusion best demonstrated on series 515, image 8 with preserved distal enhancement although the downstream middle MCA branches are highly diminutive. Venous sinuses: Patent. Anatomic variants: Dominant right vertebral artery. Review of the MIP images confirms the above findings IMPRESSION: 1. Positive for Right MCA M2 middle division near-occlusion/critical stenosis (series 515, image 8). The MCA branches distal to the this are attenuated. However, CTP detects no  infarct core and only 10 mL of Right MCA territory penumbra. Plus the plain CT appearance is that of chronic right MCA ischemia. Therefore, suspect this constellation reflects chronic more so than acute Right MCA vessel disease. 2. And furthermore, there are bilateral cervical ICA and ICA siphon hemodynamically significant stenoses: - Right ICA origin 60-65% stenosis. - Left carotid bifurcation RADIOGRAPHIC STRING SIGN STENOSIS. - Moderate bilateral ICA siphon stenosis due to calcified plaque. 3. Negative vertebral and posterior circulation. 4. Aortic Atherosclerosis (ICD10-I70.0) and Emphysema (ICD10-J43.9). Electronically Signed: By: Genevie Ann M.D. On: 10/16/2019 19:26   CT CEREBRAL PERFUSION W CONTRAST  Addendum Date: 10/16/2019   ADDENDUM REPORT: 10/16/2019 19:40 ADDENDUM: Study discussed by telephone with Dr. Kerman Passey in the ED on 10/16/2019 at 1933 hours. Electronically Signed   By: Genevie Ann M.D.   On: 10/16/2019 19:40   Result Date: 10/16/2019 CLINICAL DATA:  74 year old female code stroke presentation with left side weakness since 1530 or 1600 hours. EXAM: CT ANGIOGRAPHY HEAD AND NECK CT PERFUSION BRAIN TECHNIQUE: Multidetector CT imaging of the head and neck was performed using the standard protocol during bolus administration of intravenous contrast. Multiplanar CT image reconstructions and MIPs were obtained to evaluate the vascular anatomy. Carotid stenosis measurements (when applicable) are obtained utilizing NASCET criteria, using the distal internal carotid diameter as the denominator. Multiphase CT imaging of the brain was performed following IV bolus contrast injection. Subsequent parametric perfusion maps were calculated using RAPID software. CONTRAST:  140mL OMNIPAQUE IOHEXOL 350 MG/ML SOLN COMPARISON:  Plain head CT 1724 hours today. FINDINGS: CT Brain Perfusion Findings: ASPECTS: 10, presumed chronic right MCA encephalomalacia. CBF (<30%) Volume: None, CBV appears increased in the area of  abnormal T-max. Perfusion (Tmax>6.0s) volume: 82mL Mismatch Volume: 23mL Infarction Location:No infarct core, but ischemia localizes to the right frontal operculum and posterior right MCA territory. CT HEAD Brain: Repeat plain head CT images at 1847 hours. Abnormal cortical and white matter hypodensity in the posterior and middle right MCA territory appears stable from earlier. No associated hemorrhage or mass effect. As before there seems to be ex vacuo enlargement of the right lateral ventricle. Calvarium and skull base: Stable, negative. Paranasal sinuses: Visualized paranasal sinuses and mastoids are stable and well pneumatized. Orbits: No acute findings. CTA NECK Skeleton: Absent dentition. Prior cervical ACDF with solid arthrodesis. Advanced cervical spine degeneration elsewhere. No acute osseous abnormality identified. Upper chest: Centrilobular and to a lesser extent paraseptal emphysema. Calcified aortic atherosclerosis. No superior mediastinal lymphadenopathy. Other neck: No acute findings. Aortic arch: 3 vessel arch configuration. Mild great vessel origin atherosclerosis. Right carotid system: Brachiocephalic artery plaque without stenosis. Normal right CCA origin. Tortuous  proximal right CCA with a kinked appearance at the thoracic inlet on series 508, image 119. Heavily calcified right ECA and ICA origins with additional posterior right ICA origin soft plaque. Right ICA origin stenosis is 60-65 % with respect to the distal vessel. See series 508, image 101 and series 512, image 49. The right ICA remains patent to the skull base. Left carotid system: Negative left CCA origin. Tortuous proximal left CCA. Heavily calcified left carotid bifurcation and right ICA origin. RADIOGRAPHIC STRING SIGN STENOSIS just proximal to the bifurcation on series 512, image 98. Less than 50% additional proximal right ICA stenosis. The right ICA remains patent to the skull base. Vertebral arteries: Mild proximal right  subclavian calcified plaque without stenosis. Normal right vertebral artery origin. Right vertebral artery is patent to the skull base without plaque or stenosis. Normal proximal left subclavian artery. Normal left vertebral artery origin. Tortuous left V1 segment. The left vertebral is mildly non dominant and patent to the skull base without stenosis. CTA HEAD Posterior circulation: Mild bilateral V4 calcified plaque without stenosis. The right V4 segment is mildly dominant. Patent PICA origins and vertebrobasilar junction without stenosis. Patent basilar artery without stenosis. Normal SCA and PCA origins. Posterior communicating arteries are diminutive or absent. Bilateral PCA branches are within normal limits. Anterior circulation: Both ICA siphons are patent. On the left there is moderate calcified plaque and moderate stenosis just distal to the anterior genu (series 509, image 225. Normal left ophthalmic artery origin. On the right moderate to severe calcified plaque most pronounced in the cavernous segment with moderate stenosis at the petrous and cavernous junction. Normal right ophthalmic artery origin. Patent carotid termini with normal MCA and ACA origins. Anterior communicating artery and bilateral ACA branches are within normal limits. Left MCA M1 segment and trifurcation are patent without stenosis. The left MCA trifurcation is ectatic but there is no discrete saccular aneurysm. Left MCA branches are within normal limits. Right MCA M1 segment and bifurcation are patent without stenosis. In the dominant middle M2 division there is a high-grade stenosis or short segment occlusion best demonstrated on series 515, image 8 with preserved distal enhancement although the downstream middle MCA branches are highly diminutive. Venous sinuses: Patent. Anatomic variants: Dominant right vertebral artery. Review of the MIP images confirms the above findings IMPRESSION: 1. Positive for Right MCA M2 middle division  near-occlusion/critical stenosis (series 515, image 8). The MCA branches distal to the this are attenuated. However, CTP detects no infarct core and only 10 mL of Right MCA territory penumbra. Plus the plain CT appearance is that of chronic right MCA ischemia. Therefore, suspect this constellation reflects chronic more so than acute Right MCA vessel disease. 2. And furthermore, there are bilateral cervical ICA and ICA siphon hemodynamically significant stenoses: - Right ICA origin 60-65% stenosis. - Left carotid bifurcation RADIOGRAPHIC STRING SIGN STENOSIS. - Moderate bilateral ICA siphon stenosis due to calcified plaque. 3. Negative vertebral and posterior circulation. 4. Aortic Atherosclerosis (ICD10-I70.0) and Emphysema (ICD10-J43.9). Electronically Signed: By: Genevie Ann M.D. On: 10/16/2019 19:26   CT HEAD CODE STROKE WO CONTRAST  Addendum Date: 10/16/2019   ADDENDUM REPORT: 10/16/2019 17:49 ADDENDUM: Study discussed by telephone with Dr. Kerman Passey on 10/16/2019 at 1743 hours. Electronically Signed   By: Genevie Ann M.D.   On: 10/16/2019 17:49   Result Date: 10/16/2019 CLINICAL DATA:  Code stroke. 74 year old female with left side weakness since around 15 30 or 1600 hours today. EXAM: CT HEAD WITHOUT CONTRAST TECHNIQUE: Contiguous axial  images were obtained from the base of the skull through the vertex without intravenous contrast. COMPARISON:  Head CT 04/24/2015. Brain MRI 09/02/2014. FINDINGS: Brain: Confluent new right frontal lobe white matter hypodensity, but associated with ex vacuo enlargement of the right lateral ventricle. There is new chronic appearing encephalomalacia of the right insula and operculum. Progressed cerebral white matter and deep gray matter hypodensity elsewhere in both hemispheres since 2016. No acute intracranial hemorrhage identified. No midline shift, mass effect, or evidence of intracranial mass lesion. Vascular: Calcified atherosclerosis at the skull base. No suspicious  intracranial vascular hyperdensity. Skull: No acute osseous abnormality identified. Sinuses/Orbits: Visualized paranasal sinuses and mastoids are stable and well pneumatized. Other: No acute orbit or scalp soft tissue findings. ASPECTS [facility] Stroke Program Early CT Score) Total score (0-10 with 10 being normal): 10 (presuming chronic encephalomalacia in the right frontal lobe). IMPRESSION: 1. New since 2016 but chronic appearing ischemia in the right MCA territory. Underlying advanced chronic small vessel disease. 2. No or definite acute cortically based infarct. No acute intracranial hemorrhage. ASPECTS 10. Electronically Signed: By: Genevie Ann M.D. On: 10/16/2019 17:38     ASSESSMENT AND PLAN   74 y.o. female with past medical history of hypertension, hyperlipidemia, diabetes mellitus type 2, prior CVA with most recent stroke in March 2020 secondary to right M1 occlusion status post IV TPA presents to [facility] emergency department with worsening left-sided weakness.  Upon chart review from care everywhere , she does have history of recurrent headaches.  She has mild rest of the left hemiparesis.  She is a poor historian, currently she is close to baseline.  Will obtain MRI brain to see if she has had an acute stroke, if negative differentials include possibly focal seizure vs migraine vs hypoperfusion. Recommend UA to rule out UTI.  Regarding left ICA stenosis, likely chronic however significant and I think she would benefit from vascular surgery evaluation.    Acute left-sided weakness D/D acute ischemic stroke/TIA, focal seizure, migraine versus recrudescence of old stroke symptoms History of right MCA infarction, cryptogenic stroke   Recommendations Routine EEG in the morning MRI brain without contrast Echocardiogram BP goal: permissive hypertension Vascular surgery consult for left ICA Swallow evaluation before diet Frequent neurochecks PT OT evaluation A1c and lipid  profile  Robertha Staples [facility] Pager Number [phone]

## 2019-10-17 NOTE — Progress Notes (Signed)
Carotid artery duplex exam completed.  Preliminary results can be found under CV proc under chart review.  10/17/2019 11:19 AM  Kirin Pastorino, K., RDMS, RVT

## 2019-10-17 NOTE — Progress Notes (Addendum)
PROGRESS NOTE  Kendra [name] [id number] DOB: Sep 04, 1946 DOA: 10/17/2019 PCP: [name], NP (Inactive)  HPI/Recap of past 24 hours: Kendra [name] is a 74 y.o. female with past medical history of hypertension, hyperlipidemia, diabetes mellitus type 2, prior CVA with most recent stroke in March 2020 secondary to right M1 occlusion status post IV TPA and thrombectomy and residual left hemiparesis presents to the ED at [facility] with worsening weakness on the left side.  Patient stated that she was doing well when suddenly around 3 PM she started to have increased weakness on the left side associated with headache with decreased ability /inability to walk.  She was evaluated by teleneurology and felt that she was not a candidate for TPA.  CTA did not show acute finding but showed chronic right MCA infarct and CT angiogram also showed a severely stenosed left ICA with string sign Patient seen and examined at bedside.  States she is doing much better she is just returning to her bedside with PT present now   Subjective: Patient seen and examined at bedside.  States she is doing much better she is just returning to her bedside with PT present now    Assessment/Plan: Principal Problem:   Stenosis of intracranial portions of left internal carotid artery Active Problems:   Hyperlipidemia associated with type 2 diabetes mellitus ([place])   Tobacco use disorder   Diabetes mellitus type 2, controlled, with complications ([place])   Left-sided weakness  1.  Worsening left-sided weakness secondary to right MCA infarct.  She is not a candidate for TPA.  She has been seen by [facility] neurologist who recommended consulting with vascular for possible CEA.  Continue aspirin and Plavix  Consult called to vascular.  We will see patient   2.non-insulin-dependent diabetes mellitus, Continue sensitive sliding scale insulin Her hemoglobin A1c was 6.2   3.  Hypertension Permissive blood  pressure at this time Brain MRI resulted:1. No acute finding. 2. Large remote right MCA distribution infarct. 3. Chronic small vessel ischemia most notably affecting the pons   4.  Hyperlipidemia Continue statin   5.     Normocytic anemia Hemoglobin was 11.4 previously Iron level is normal at 52  6.  Vitamin B12 deficiency.   Her vitamin B12 is 174  we will begin replacement  7.  Tobacco use -Continue nicotine patch Patient was counseled   Code Status: Full  Severity of Illness: The appropriate patient status for this patient is INPATIENT. Inpatient status is judged to be reasonable and necessary in order to provide the required intensity of service to ensure the patient's safety. The patient's presenting symptoms, physical exam findings, and initial radiographic and laboratory data in the context of their chronic comorbidities is felt to place them at high risk for further clinical deterioration. Furthermore, it is not anticipated that the patient will be medically stable for discharge from the hospital within 2 midnights of admission. The following factors support the patient status of inpatient.   " The patient's presenting symptoms include CVA  " The worrisome physical exam findings include weakness. " The initial radiographic and laboratory data are worrisome because of CVA. " The chronic co-morbidities include CVA.   * I certify that at the point of admission it is my clinical judgment that the patient will require inpatient hospital care spanning beyond 2 midnights from the point of admission due to high intensity of service, high risk for further deterioration and high frequency of surveillance required.*  Family Communication: None at bedside  Disposition Plan: Possibly home   Consultants:  Vascular Dr. Thermon Leyland  Neurology  Procedures:  None  Antimicrobials:  None  DVT prophylaxis: SCD   Objective: Vitals:   10/17/19 0546  BP: 125/80    Pulse: 93  Resp: 18  Temp: 98.4 F (36.9 C)  TempSrc: Oral  SpO2: 100%    Intake/Output Summary (Last 24 hours) at 10/17/2019 0902 Last data filed at 10/17/2019 0805 Gross per 24 hour  Intake 4 ml  Output --  Net 4 ml   There were no vitals filed for this visit. There is no height or weight on file to calculate BMI.  Exam:  . General: 74 y.o. year-old female well developed well nourished in no acute distress.  Alert and oriented x3. . Cardiovascular: Regular rate and rhythm with no rubs or gallops.  No thyromegaly or JVD noted.   [name] Respiratory: Clear to auscultation with no wheezes or rales. Good inspiratory effort. . Abdomen: Soft nontender nondistended with normal bowel sounds x4 quadrants. . Musculoskeletal: No lower extremity edema. 2/4 pulses in all 4 extremities. . Skin: No ulcerative lesions noted or rashes, . Psychiatry: Mood is appropriate for condition and setting . Neurology: Gait is slow. [name] HEENT: +3/6 left carotid bruit    Data Reviewed: CBC: Recent Labs  Lab 10/16/19 1717  WBC 6.3  NEUTROABS 3.6  HGB 11.4*  HCT 35.2*  MCV 93.9  PLT [id number]   Basic Metabolic Panel: Recent Labs  Lab 10/16/19 1717  NA 141  K 3.7  CL 106  CO2 25  GLUCOSE 102*  BUN 14  CREATININE 0.78  CALCIUM 9.6   GFR: Estimated Creatinine Clearance: 55.7 mL/min (by C-G formula based on SCr of 0.78 mg/dL). Liver Function Tests: Recent Labs  Lab 10/16/19 1717  AST 28  ALT 26  ALKPHOS 65  BILITOT 0.6  PROT 7.2  ALBUMIN 4.0   No results for input(s): LIPASE, AMYLASE in the last 168 hours. No results for input(s): AMMONIA in the last 168 hours. Coagulation Profile: Recent Labs  Lab 10/16/19 1717  INR 1.1   Cardiac Enzymes: No results for input(s): CKTOTAL, CKMB, CKMBINDEX, TROPONINI in the last 168 hours. BNP (last 3 results) No results for input(s): PROBNP in the last 8760 hours. HbA1C: Recent Labs    10/17/19 0606  HGBA1C 6.2*   CBG: Recent Labs  Lab  10/16/19 1716 10/17/19 0609  GLUCAP 95 82   Lipid Profile: Recent Labs    10/17/19 0606  CHOL 93  HDL 40*  LDLCALC 37  TRIG 79  CHOLHDL 2.3   Thyroid Function Tests: No results for input(s): TSH, T4TOTAL, FREET4, T3FREE, THYROIDAB in the last 72 hours. Anemia Panel: Recent Labs    10/17/19 0606  VITAMINB12 170*  FOLATE 19.6  FERRITIN 74  TIBC 298  IRON 59  RETICCTPCT 0.9   Urine analysis:    Component Value Date/Time   COLORURINE YELLOW (A) 04/24/2015 2126   APPEARANCEUR CLEAR (A) 04/24/2015 2126   APPEARANCEUR Clear 09/01/2014 2259   LABSPEC 1.011 04/24/2015 2126   LABSPEC 1.027 09/01/2014 2259   PHURINE 5.0 04/24/2015 2126   GLUCOSEU NEGATIVE 04/24/2015 2126   GLUCOSEU >=500 09/01/2014 2259   HGBUR NEGATIVE 04/24/2015 2126   BILIRUBINUR NEGATIVE 04/24/2015 2126   BILIRUBINUR Negative 09/01/2014 [place] 04/24/2015 2126   PROTEINUR NEGATIVE 04/24/2015 2126   NITRITE NEGATIVE 04/24/2015 2126   LEUKOCYTESUR NEGATIVE 04/24/2015 2126   LEUKOCYTESUR Negative  09/01/2014 2259   Sepsis Labs: @LABRCNTIP (procalcitonin:4,lacticidven:4)  ) Recent Results (from the past 240 hour(s))  Respiratory Panel by RT PCR (Flu A&B, Covid) - Nasopharyngeal Swab     Status: None   Collection Time: 10/16/19  6:36 PM   Specimen: Nasopharyngeal Swab  Result Value Ref Range Status   SARS Coronavirus 2 by RT PCR NEGATIVE NEGATIVE Final    Comment: (NOTE) SARS-CoV-2 target nucleic acids are NOT DETECTED. The SARS-CoV-2 RNA is generally detectable in upper respiratoy specimens during the acute phase of infection. The lowest concentration of SARS-CoV-2 viral copies this assay can detect is 131 copies/mL. A negative result does not preclude SARS-Cov-2 infection and should not be used as the sole basis for treatment or other patient management decisions. A negative result may occur with  improper specimen collection/handling, submission of specimen other than  nasopharyngeal swab, presence of viral mutation(s) within the areas targeted by this assay, and inadequate number of viral copies (<131 copies/mL). A negative result must be combined with clinical observations, patient history, and epidemiological information. The expected result is Negative. Fact Sheet for Patients:  PinkCheek.be Fact Sheet for Healthcare Providers:  GravelBags.it This test is not yet ap proved or cleared by the [place] FDA and  has been authorized for detection and/or diagnosis of SARS-CoV-2 by FDA under an Emergency Use Authorization (EUA). This EUA will remain  in effect (meaning this test can be used) for the duration of the COVID-19 declaration under Section 564(b)(1) of the Act, 21 U.S.C. section 360bbb-3(b)(1), unless the authorization is terminated or revoked sooner.    Influenza A by PCR NEGATIVE NEGATIVE Final   Influenza B by PCR NEGATIVE NEGATIVE Final    Comment: (NOTE) The Xpert Xpress SARS-CoV-2/FLU/RSV assay is intended as an aid in  the diagnosis of influenza from Nasopharyngeal swab specimens and  should not be used as a sole basis for treatment. Nasal washings and  aspirates are unacceptable for Xpert Xpress SARS-CoV-2/FLU/RSV  testing. Fact Sheet for Patients: PinkCheek.be Fact Sheet for Healthcare Providers: GravelBags.it This test is not yet approved or cleared by the [place] FDA and  has been authorized for detection and/or diagnosis of SARS-CoV-2 by  FDA under an Emergency Use Authorization (EUA). This EUA will remain  in effect (meaning this test can be used) for the duration of the  Covid-19 declaration under Section 564(b)(1) of the Act, 21  U.S.C. section 360bbb-3(b)(1), unless the authorization is  terminated or revoked. Performed at [facility], [address] [place], [place] [address]        Studies: CT ANGIO HEAD W OR WO CONTRAST  Addendum Date: 10/16/2019   ADDENDUM REPORT: 10/16/2019 19:40 ADDENDUM: Study discussed by telephone with Dr. Kerman Passey in the ED on 10/16/2019 at 1933 hours. Electronically Signed   By: Genevie Ann M.D.   On: 10/16/2019 19:40   Result Date: 10/16/2019 CLINICAL DATA:  74 year old female code stroke presentation with left side weakness since 1530 or 1600 hours. EXAM: CT ANGIOGRAPHY HEAD AND NECK CT PERFUSION BRAIN TECHNIQUE: Multidetector CT imaging of the head and neck was performed using the standard protocol during bolus administration of intravenous contrast. Multiplanar CT image reconstructions and MIPs were obtained to evaluate the vascular anatomy. Carotid stenosis measurements (when applicable) are obtained utilizing NASCET criteria, using the distal internal carotid diameter as the denominator. Multiphase CT imaging of the brain was performed following IV bolus contrast injection. Subsequent parametric perfusion maps were calculated using RAPID software. CONTRAST:  151mL OMNIPAQUE  IOHEXOL 350 MG/ML SOLN COMPARISON:  Plain head CT 1724 hours today. FINDINGS: CT Brain Perfusion Findings: ASPECTS: 10, presumed chronic right MCA encephalomalacia. CBF (<30%) Volume: None, CBV appears increased in the area of abnormal T-max. Perfusion (Tmax>6.0s) volume: 64mL Mismatch Volume: 12mL Infarction Location:No infarct core, but ischemia localizes to the right frontal operculum and posterior right MCA territory. CT HEAD Brain: Repeat plain head CT images at 1847 hours. Abnormal cortical and white matter hypodensity in the posterior and middle right MCA territory appears stable from earlier. No associated hemorrhage or mass effect. As before there seems to be ex vacuo enlargement of the right lateral ventricle. Calvarium and skull base: Stable, negative. Paranasal sinuses: Visualized paranasal sinuses and mastoids are stable and well pneumatized. Orbits: No acute findings.  CTA NECK Skeleton: Absent dentition. Prior cervical ACDF with solid arthrodesis. Advanced cervical spine degeneration elsewhere. No acute osseous abnormality identified. Upper chest: Centrilobular and to a lesser extent paraseptal emphysema. Calcified aortic atherosclerosis. No superior mediastinal lymphadenopathy. Other neck: No acute findings. Aortic arch: 3 vessel arch configuration. Mild great vessel origin atherosclerosis. Right carotid system: Brachiocephalic artery plaque without stenosis. Normal right CCA origin. Tortuous proximal right CCA with a kinked appearance at the thoracic inlet on series 508, image 119. Heavily calcified right ECA and ICA origins with additional posterior right ICA origin soft plaque. Right ICA origin stenosis is 60-65 % with respect to the distal vessel. See series 508, image 101 and series 512, image 49. The right ICA remains patent to the skull base. Left carotid system: Negative left CCA origin. Tortuous proximal left CCA. Heavily calcified left carotid bifurcation and right ICA origin. RADIOGRAPHIC STRING SIGN STENOSIS just proximal to the bifurcation on series 512, image 98. Less than 50% additional proximal right ICA stenosis. The right ICA remains patent to the skull base. Vertebral arteries: Mild proximal right subclavian calcified plaque without stenosis. Normal right vertebral artery origin. Right vertebral artery is patent to the skull base without plaque or stenosis. Normal proximal left subclavian artery. Normal left vertebral artery origin. Tortuous left V1 segment. The left vertebral is mildly non dominant and patent to the skull base without stenosis. CTA HEAD Posterior circulation: Mild bilateral V4 calcified plaque without stenosis. The right V4 segment is mildly dominant. Patent PICA origins and vertebrobasilar junction without stenosis. Patent basilar artery without stenosis. Normal SCA and PCA origins. Posterior communicating arteries are diminutive or absent.  Bilateral PCA branches are within normal limits. Anterior circulation: Both ICA siphons are patent. On the left there is moderate calcified plaque and moderate stenosis just distal to the anterior genu (series 509, image 225. Normal left ophthalmic artery origin. On the right moderate to severe calcified plaque most pronounced in the cavernous segment with moderate stenosis at the petrous and cavernous junction. Normal right ophthalmic artery origin. Patent carotid termini with normal MCA and ACA origins. Anterior communicating artery and bilateral ACA branches are within normal limits. Left MCA M1 segment and trifurcation are patent without stenosis. The left MCA trifurcation is ectatic but there is no discrete saccular aneurysm. Left MCA branches are within normal limits. Right MCA M1 segment and bifurcation are patent without stenosis. In the dominant middle M2 division there is a high-grade stenosis or short segment occlusion best demonstrated on series 515, image 8 with preserved distal enhancement although the downstream middle MCA branches are highly diminutive. Venous sinuses: Patent. Anatomic variants: Dominant right vertebral artery. Review of the MIP images confirms the above findings IMPRESSION: 1. Positive for  Right MCA M2 middle division near-occlusion/critical stenosis (series 515, image 8). The MCA branches distal to the this are attenuated. However, CTP detects no infarct core and only 10 mL of Right MCA territory penumbra. Plus the plain CT appearance is that of chronic right MCA ischemia. Therefore, suspect this constellation reflects chronic more so than acute Right MCA vessel disease. 2. And furthermore, there are bilateral cervical ICA and ICA siphon hemodynamically significant stenoses: - Right ICA origin 60-65% stenosis. - Left carotid bifurcation RADIOGRAPHIC STRING SIGN STENOSIS. - Moderate bilateral ICA siphon stenosis due to calcified plaque. 3. Negative vertebral and posterior  circulation. 4. Aortic Atherosclerosis (ICD10-I70.0) and Emphysema (ICD10-J43.9). Electronically Signed: By: Genevie Ann M.D. On: 10/16/2019 19:26   CT ANGIO NECK W OR WO CONTRAST  Addendum Date: 10/16/2019   ADDENDUM REPORT: 10/16/2019 19:40 ADDENDUM: Study discussed by telephone with Dr. Kerman Passey in the ED on 10/16/2019 at 1933 hours. Electronically Signed   By: Genevie Ann M.D.   On: 10/16/2019 19:40   Result Date: 10/16/2019 CLINICAL DATA:  74 year old female code stroke presentation with left side weakness since 1530 or 1600 hours. EXAM: CT ANGIOGRAPHY HEAD AND NECK CT PERFUSION BRAIN TECHNIQUE: Multidetector CT imaging of the head and neck was performed using the standard protocol during bolus administration of intravenous contrast. Multiplanar CT image reconstructions and MIPs were obtained to evaluate the vascular anatomy. Carotid stenosis measurements (when applicable) are obtained utilizing NASCET criteria, using the distal internal carotid diameter as the denominator. Multiphase CT imaging of the brain was performed following IV bolus contrast injection. Subsequent parametric perfusion maps were calculated using RAPID software. CONTRAST:  181mL OMNIPAQUE IOHEXOL 350 MG/ML SOLN COMPARISON:  Plain head CT 1724 hours today. FINDINGS: CT Brain Perfusion Findings: ASPECTS: 10, presumed chronic right MCA encephalomalacia. CBF (<30%) Volume: None, CBV appears increased in the area of abnormal T-max. Perfusion (Tmax>6.0s) volume: 17mL Mismatch Volume: 90mL Infarction Location:No infarct core, but ischemia localizes to the right frontal operculum and posterior right MCA territory. CT HEAD Brain: Repeat plain head CT images at 1847 hours. Abnormal cortical and white matter hypodensity in the posterior and middle right MCA territory appears stable from earlier. No associated hemorrhage or mass effect. As before there seems to be ex vacuo enlargement of the right lateral ventricle. Calvarium and skull base: Stable,  negative. Paranasal sinuses: Visualized paranasal sinuses and mastoids are stable and well pneumatized. Orbits: No acute findings. CTA NECK Skeleton: Absent dentition. Prior cervical ACDF with solid arthrodesis. Advanced cervical spine degeneration elsewhere. No acute osseous abnormality identified. Upper chest: Centrilobular and to a lesser extent paraseptal emphysema. Calcified aortic atherosclerosis. No superior mediastinal lymphadenopathy. Other neck: No acute findings. Aortic arch: 3 vessel arch configuration. Mild great vessel origin atherosclerosis. Right carotid system: Brachiocephalic artery plaque without stenosis. Normal right CCA origin. Tortuous proximal right CCA with a kinked appearance at the thoracic inlet on series 508, image 119. Heavily calcified right ECA and ICA origins with additional posterior right ICA origin soft plaque. Right ICA origin stenosis is 60-65 % with respect to the distal vessel. See series 508, image 101 and series 512, image 49. The right ICA remains patent to the skull base. Left carotid system: Negative left CCA origin. Tortuous proximal left CCA. Heavily calcified left carotid bifurcation and right ICA origin. RADIOGRAPHIC STRING SIGN STENOSIS just proximal to the bifurcation on series 512, image 98. Less than 50% additional proximal right ICA stenosis. The right ICA remains patent to the skull base. Vertebral arteries: Mild proximal  right subclavian calcified plaque without stenosis. Normal right vertebral artery origin. Right vertebral artery is patent to the skull base without plaque or stenosis. Normal proximal left subclavian artery. Normal left vertebral artery origin. Tortuous left V1 segment. The left vertebral is mildly non dominant and patent to the skull base without stenosis. CTA HEAD Posterior circulation: Mild bilateral V4 calcified plaque without stenosis. The right V4 segment is mildly dominant. Patent PICA origins and vertebrobasilar junction without  stenosis. Patent basilar artery without stenosis. Normal SCA and PCA origins. Posterior communicating arteries are diminutive or absent. Bilateral PCA branches are within normal limits. Anterior circulation: Both ICA siphons are patent. On the left there is moderate calcified plaque and moderate stenosis just distal to the anterior genu (series 509, image 225. Normal left ophthalmic artery origin. On the right moderate to severe calcified plaque most pronounced in the cavernous segment with moderate stenosis at the petrous and cavernous junction. Normal right ophthalmic artery origin. Patent carotid termini with normal MCA and ACA origins. Anterior communicating artery and bilateral ACA branches are within normal limits. Left MCA M1 segment and trifurcation are patent without stenosis. The left MCA trifurcation is ectatic but there is no discrete saccular aneurysm. Left MCA branches are within normal limits. Right MCA M1 segment and bifurcation are patent without stenosis. In the dominant middle M2 division there is a high-grade stenosis or short segment occlusion best demonstrated on series 515, image 8 with preserved distal enhancement although the downstream middle MCA branches are highly diminutive. Venous sinuses: Patent. Anatomic variants: Dominant right vertebral artery. Review of the MIP images confirms the above findings IMPRESSION: 1. Positive for Right MCA M2 middle division near-occlusion/critical stenosis (series 515, image 8). The MCA branches distal to the this are attenuated. However, CTP detects no infarct core and only 10 mL of Right MCA territory penumbra. Plus the plain CT appearance is that of chronic right MCA ischemia. Therefore, suspect this constellation reflects chronic more so than acute Right MCA vessel disease. 2. And furthermore, there are bilateral cervical ICA and ICA siphon hemodynamically significant stenoses: - Right ICA origin 60-65% stenosis. - Left carotid bifurcation  RADIOGRAPHIC STRING SIGN STENOSIS. - Moderate bilateral ICA siphon stenosis due to calcified plaque. 3. Negative vertebral and posterior circulation. 4. Aortic Atherosclerosis (ICD10-I70.0) and Emphysema (ICD10-J43.9). Electronically Signed: By: Genevie Ann M.D. On: 10/16/2019 19:26   MR BRAIN WO CONTRAST  Result Date: 10/17/2019 CLINICAL DATA:  Stroke presentation with left-sided weakness. EXAM: MRI HEAD WITHOUT CONTRAST TECHNIQUE: Multiplanar, multiecho pulse sequences of the brain and surrounding structures were obtained without intravenous contrast. COMPARISON:  CT and CTA yesterday. FINDINGS: Brain: No acute infarction, hemorrhage, hydrocephalus, extra-axial collection or mass lesion. Large area of remote right MCA territory infarct affecting cortex and white matter. The right frontal lobe, parietal lobe, insula, anterior temporal lobe are all affected. Chronic small vessel ischemia in the cerebral white matter and pons, moderate to extensive in the pons. Cerebral volume loss without specific pattern. No chronic blood products. Vascular: Normal flow voids.  CT was performed yesterday. Skull and upper cervical spine: Normal marrow signal Sinuses/Orbits: Negative IMPRESSION: 1. No acute finding. 2. Large remote right MCA distribution infarct. 3. Chronic small vessel ischemia most notably affecting the pons. Electronically Signed   By: Monte Fantasia M.D.   On: 10/17/2019 08:23   CT CEREBRAL PERFUSION W CONTRAST  Addendum Date: 10/16/2019   ADDENDUM REPORT: 10/16/2019 19:40 ADDENDUM: Study discussed by telephone with Dr. Kerman Passey in the ED on 10/16/2019  at 1933 hours. Electronically Signed   By: Genevie Ann M.D.   On: 10/16/2019 19:40   Result Date: 10/16/2019 CLINICAL DATA:  74 year old female code stroke presentation with left side weakness since 1530 or 1600 hours. EXAM: CT ANGIOGRAPHY HEAD AND NECK CT PERFUSION BRAIN TECHNIQUE: Multidetector CT imaging of the head and neck was performed using the standard  protocol during bolus administration of intravenous contrast. Multiplanar CT image reconstructions and MIPs were obtained to evaluate the vascular anatomy. Carotid stenosis measurements (when applicable) are obtained utilizing NASCET criteria, using the distal internal carotid diameter as the denominator. Multiphase CT imaging of the brain was performed following IV bolus contrast injection. Subsequent parametric perfusion maps were calculated using RAPID software. CONTRAST:  111mL OMNIPAQUE IOHEXOL 350 MG/ML SOLN COMPARISON:  Plain head CT 1724 hours today. FINDINGS: CT Brain Perfusion Findings: ASPECTS: 10, presumed chronic right MCA encephalomalacia. CBF (<30%) Volume: None, CBV appears increased in the area of abnormal T-max. Perfusion (Tmax>6.0s) volume: 19mL Mismatch Volume: 70mL Infarction Location:No infarct core, but ischemia localizes to the right frontal operculum and posterior right MCA territory. CT HEAD Brain: Repeat plain head CT images at 1847 hours. Abnormal cortical and white matter hypodensity in the posterior and middle right MCA territory appears stable from earlier. No associated hemorrhage or mass effect. As before there seems to be ex vacuo enlargement of the right lateral ventricle. Calvarium and skull base: Stable, negative. Paranasal sinuses: Visualized paranasal sinuses and mastoids are stable and well pneumatized. Orbits: No acute findings. CTA NECK Skeleton: Absent dentition. Prior cervical ACDF with solid arthrodesis. Advanced cervical spine degeneration elsewhere. No acute osseous abnormality identified. Upper chest: Centrilobular and to a lesser extent paraseptal emphysema. Calcified aortic atherosclerosis. No superior mediastinal lymphadenopathy. Other neck: No acute findings. Aortic arch: 3 vessel arch configuration. Mild great vessel origin atherosclerosis. Right carotid system: Brachiocephalic artery plaque without stenosis. Normal right CCA origin. Tortuous proximal right CCA  with a kinked appearance at the thoracic inlet on series 508, image 119. Heavily calcified right ECA and ICA origins with additional posterior right ICA origin soft plaque. Right ICA origin stenosis is 60-65 % with respect to the distal vessel. See series 508, image 101 and series 512, image 49. The right ICA remains patent to the skull base. Left carotid system: Negative left CCA origin. Tortuous proximal left CCA. Heavily calcified left carotid bifurcation and right ICA origin. RADIOGRAPHIC STRING SIGN STENOSIS just proximal to the bifurcation on series 512, image 98. Less than 50% additional proximal right ICA stenosis. The right ICA remains patent to the skull base. Vertebral arteries: Mild proximal right subclavian calcified plaque without stenosis. Normal right vertebral artery origin. Right vertebral artery is patent to the skull base without plaque or stenosis. Normal proximal left subclavian artery. Normal left vertebral artery origin. Tortuous left V1 segment. The left vertebral is mildly non dominant and patent to the skull base without stenosis. CTA HEAD Posterior circulation: Mild bilateral V4 calcified plaque without stenosis. The right V4 segment is mildly dominant. Patent PICA origins and vertebrobasilar junction without stenosis. Patent basilar artery without stenosis. Normal SCA and PCA origins. Posterior communicating arteries are diminutive or absent. Bilateral PCA branches are within normal limits. Anterior circulation: Both ICA siphons are patent. On the left there is moderate calcified plaque and moderate stenosis just distal to the anterior genu (series 509, image 225. Normal left ophthalmic artery origin. On the right moderate to severe calcified plaque most pronounced in the cavernous segment with moderate stenosis at  the petrous and cavernous junction. Normal right ophthalmic artery origin. Patent carotid termini with normal MCA and ACA origins. Anterior communicating artery and bilateral  ACA branches are within normal limits. Left MCA M1 segment and trifurcation are patent without stenosis. The left MCA trifurcation is ectatic but there is no discrete saccular aneurysm. Left MCA branches are within normal limits. Right MCA M1 segment and bifurcation are patent without stenosis. In the dominant middle M2 division there is a high-grade stenosis or short segment occlusion best demonstrated on series 515, image 8 with preserved distal enhancement although the downstream middle MCA branches are highly diminutive. Venous sinuses: Patent. Anatomic variants: Dominant right vertebral artery. Review of the MIP images confirms the above findings IMPRESSION: 1. Positive for Right MCA M2 middle division near-occlusion/critical stenosis (series 515, image 8). The MCA branches distal to the this are attenuated. However, CTP detects no infarct core and only 10 mL of Right MCA territory penumbra. Plus the plain CT appearance is that of chronic right MCA ischemia. Therefore, suspect this constellation reflects chronic more so than acute Right MCA vessel disease. 2. And furthermore, there are bilateral cervical ICA and ICA siphon hemodynamically significant stenoses: - Right ICA origin 60-65% stenosis. - Left carotid bifurcation RADIOGRAPHIC STRING SIGN STENOSIS. - Moderate bilateral ICA siphon stenosis due to calcified plaque. 3. Negative vertebral and posterior circulation. 4. Aortic Atherosclerosis (ICD10-I70.0) and Emphysema (ICD10-J43.9). Electronically Signed: By: Genevie Ann M.D. On: 10/16/2019 19:26   CT HEAD CODE STROKE WO CONTRAST  Addendum Date: 10/16/2019   ADDENDUM REPORT: 10/16/2019 17:49 ADDENDUM: Study discussed by telephone with Dr. Kerman Passey on 10/16/2019 at 1743 hours. Electronically Signed   By: Genevie Ann M.D.   On: 10/16/2019 17:49   Result Date: 10/16/2019 CLINICAL DATA:  Code stroke. 74 year old female with left side weakness since around 15 30 or 1600 hours today. EXAM: CT HEAD WITHOUT  CONTRAST TECHNIQUE: Contiguous axial images were obtained from the base of the skull through the vertex without intravenous contrast. COMPARISON:  Head CT 04/24/2015. Brain MRI 09/02/2014. FINDINGS: Brain: Confluent new right frontal lobe white matter hypodensity, but associated with ex vacuo enlargement of the right lateral ventricle. There is new chronic appearing encephalomalacia of the right insula and operculum. Progressed cerebral white matter and deep gray matter hypodensity elsewhere in both hemispheres since 2016. No acute intracranial hemorrhage identified. No midline shift, mass effect, or evidence of intracranial mass lesion. Vascular: Calcified atherosclerosis at the skull base. No suspicious intracranial vascular hyperdensity. Skull: No acute osseous abnormality identified. Sinuses/Orbits: Visualized paranasal sinuses and mastoids are stable and well pneumatized. Other: No acute orbit or scalp soft tissue findings. ASPECTS [facility] Stroke Program Early CT Score) Total score (0-10 with 10 being normal): 10 (presuming chronic encephalomalacia in the right frontal lobe). IMPRESSION: 1. New since 2016 but chronic appearing ischemia in the right MCA territory. Underlying advanced chronic small vessel disease. 2. No or definite acute cortically based infarct. No acute intracranial hemorrhage. ASPECTS 10. Electronically Signed: By: Genevie Ann M.D. On: 10/16/2019 17:38    Scheduled Meds: .  stroke: mapping our early stages of recovery book   Does not apply Once  . aspirin EC  325 mg Oral Daily  . atorvastatin  40 mg Oral q1800  . [START ON 10/18/2019] clopidogrel  75 mg Oral Daily  . insulin aspart  0-5 Units Subcutaneous QHS  . insulin aspart  0-9 Units Subcutaneous TID WC  . nicotine  7 mg Transdermal Daily    Continuous  Infusions: . sodium chloride 75 mL/hr at 10/17/19 0859     LOS: 0 days     Cristal Deer, MD [facility]  To reach me or the doctor on call, go  to: [email or web address] Password [email or web address]  10/17/2019, 9:02 AM

## 2019-10-17 NOTE — H&P (Signed)
History and Physical    Kendra [name] JHE:[id number] DOB: March 29, 1946 DOA: 10/17/2019  PCP: [name], NP (Inactive) Patient coming from: [name]  Chief Complaint: Left-sided weakness  HPI: Kendra [name] is a 74 y.o. female with medical history significant of CVA with residual left-sided weakness, type 2 diabetes, hypertension, hyperlipidemia, COPD, rheumatoid arthritis presented to [facility] ED with complaints of left arm and leg weakness.  Yesterday 1/28 around 3:30 PM patient experienced acute onset left arm and leg weakness.  States her arm was so weak that she could not pick up a glass to drink water.  She could not walk as her leg was very weak.  Her son noticed that one side of her face was drooping.  States she has had 3 prior strokes.  She smokes 0.25 pack of cigarettes daily.  Denies fevers, chills, or any recent illness.  No other complaints.  ED Course: CT head showing chronic appearing ischemia in the right MCA territory without definite acute cortically based infarct. Patient was seen by telemetry neurology and not felt to be a candidate for TPA given lack of new disabling deficits on exam and inconsistent LKN and clinical history.  Follow-up CT angiogram revealed a right MCA M2 occlusion/critical stenosis which was felt to be chronic in nature CT cerebral perfusion study did not show an infarct core and perfusion deficit to only 10 ml.  Also noted to have left ICA proximal string sign representing critical stenosis.   Patient was not felt to be a candidate for thrombectomy due to low NIH stroke scale.   Dr. Lorraine Lax from neurology was consulted who recommended transfer to [facility] for vascular surgery consultation. SARS-CoV-2 PCR test negative.  Review of Systems:  All systems reviewed and apart from history of presenting illness, are negative.  Past Medical History:  Diagnosis Date  . Back pain   . Cataract   . Cervical disc disorder   . Chest pain   . COPD (chronic  obstructive pulmonary disease) ([place])   . Depression   . Diabetes ([place])   . Diabetic neuropathy ([place])   . GERD (gastroesophageal reflux disease)   . Hyperlipidemia   . Insomnia   . Neuropathy   . Osteoarthritis   . Osteopenia   . Reflux   . Rheumatoid arthritis ([place])   . Sleep apnea    no CPAP and no suggestion that she needed one per pt  . Stroke ([place]) 2015   and again 2017  - Weakness in left leg, staggers w/ walking  . Tenosynovitis     Past Surgical History:  Procedure Laterality Date  . BREAST BIOPSY    . NECK SURGERY    . OVARIAN CYST REMOVAL       reports that she has been smoking cigarettes. She has been smoking about 0.25 packs per day. She uses smokeless tobacco. She reports that she does not drink alcohol or use drugs.  Allergies  Allergen Reactions  . Citalopram Nausea And Vomiting    Family History  Problem Relation Age of Onset  . Stroke Father   . Depression Father   . Diabetes Mother   . Hyperlipidemia Mother   . Drug abuse Sister   . Heart murmur Son   . Drug abuse Sister   . Heart murmur Son   . Heart murmur Son     Prior to Admission medications   Medication Sig Start Date End Date Taking? Authorizing Provider  acetaminophen (TYLENOL) 500 MG tablet Take  2 tablets (1,000 mg total) by mouth every 8 (eight) hours as needed for mild pain or moderate pain. 12/20/18   [name], NP  albuterol (VENTOLIN HFA) 108 (90 Base) MCG/ACT inhaler INHALE 1 TO 2 PUFFS INTO THE LUNGS EVERY 6 HOURS AS NEEDED FOR WHEEZING OR SHORTNESS OF BREATH Patient taking differently: Inhale 1-2 puffs into the lungs every 6 (six) hours as needed for wheezing or shortness of breath.  06/18/19   [name], NP  aspirin 81 MG EC tablet Take 1 tablet (81 mg total) by mouth daily. Swallow whole. 12/20/18   [name], NP  atorvastatin (LIPITOR) 40 MG tablet TAKE 1 TABLET BY MOUTH ONCE DAILY Patient taking differently: Take 40 mg by mouth daily.  08/04/19    Karamalegos, Devonne Doughty, DO  baclofen (LIORESAL) 10 MG tablet TAKE 1 TABLET BY MOUTH ONCE DAILY AS NEEDED MUSCLE SPASMS Patient not taking: Reported on 10/16/2019 05/07/19   [name], NP  Blood Glucose Monitoring Suppl (ONETOUCH VERIO) w/Device KIT 1 kit by Does not apply route 2 (two) times daily. 12/20/18   [name], NP  buPROPion (WELLBUTRIN XL) 150 MG 24 hr tablet TAKE 1 TABLET BY MOUTH ONCE DAILY Patient taking differently: Take 150 mg by mouth daily.  08/04/19   Karamalegos, Devonne Doughty, DO  Cholecalciferol (VITAMIN D3) 50 MCG (2000 UT) capsule Take 1 capsule (2,000 Units total) by mouth daily. 12/20/18   [name], NP  Fluticasone-Umeclidin-Vilant (TRELEGY ELLIPTA) 100-62.5-25 MCG/INH AEPB Inhale 1 puff into the lungs daily. 08/27/19   Karamalegos, Devonne Doughty, DO  glucose blood (ONETOUCH VERIO) test strip Use as instructed 12/20/18   [name], NP  Insulin Pen Needle (BD PEN NEEDLE NANO U/F) 32G X 4 MM MISC USE WITH LANTUS AS DIRECTED 12/20/18   [name], NP  Lancet Devices (ONE TOUCH DELICA LANCING DEV) MISC 1 Device by Does not apply route 2 (two) times daily. 12/20/18   [name], NP  lisinopril (ZESTRIL) 10 MG tablet Take 1 tablet (10 mg total) by mouth daily. 09/22/19   Karamalegos, Devonne Doughty, DO  meloxicam (MOBIC) 15 MG tablet TAKE 1 TABLET BY MOUTH ONCE DAILY AS NEEDED Patient not taking: Reported on 10/16/2019 08/04/19   Olin Hauser, DO  metFORMIN (GLUCOPHAGE-XR) 500 MG 24 hr tablet Take 1 tablet (500 mg total) by mouth 2 (two) times daily before a meal. 08/27/19   Karamalegos, Devonne Doughty, DO  nicotine polacrilex (COMMIT) 4 MG lozenge Take 4 mg by mouth as needed for smoking cessation.    [provider]  OneTouch Delica Lancets 54Y MISC 1 Device by Does not apply route 2 (two) times daily. 12/20/18   [name], NP  polyethylene glycol powder (GLYCOLAX/MIRALAX) 17 GM/SCOOP powder Take 17-34  g by mouth daily as needed for mild constipation or moderate constipation. 03/05/19   Olin Hauser, DO    Physical Exam: There were no vitals filed for this visit.  Physical Exam  Constitutional: She is oriented to person, place, and time. She appears well-developed and well-nourished. No distress.  HENT:  Head: Normocephalic.  Eyes: EOM are normal. Right eye exhibits no discharge. Left eye exhibits no discharge.  Cardiovascular: Normal rate, regular rhythm and intact distal pulses.  Pulmonary/Chest: Effort normal and breath sounds normal. No respiratory distress. She has no wheezes. She has no rales.  Abdominal: Soft. Bowel sounds are normal. She exhibits no distension. There is no abdominal tenderness. There  is no guarding.  Musculoskeletal:        General: No edema.     Cervical back: Neck supple.  Neurological: She is alert and oriented to person, place, and time. Coordination normal.  Mild left facial droop Speech fluent, tongue midline Strength 5 out of 5 in bilateral upper and lower extremities. Sensation to light touch intact throughout.  Skin: Skin is warm and dry. She is not diaphoretic.     Labs on Admission: I have personally reviewed following labs and imaging studies  CBC: Recent Labs  Lab 10/16/19 1717  WBC 6.3  NEUTROABS 3.6  HGB 11.4*  HCT 35.2*  MCV 93.9  PLT 578   Basic Metabolic Panel: Recent Labs  Lab 10/16/19 1717  NA 141  K 3.7  CL 106  CO2 25  GLUCOSE 102*  BUN 14  CREATININE 0.78  CALCIUM 9.6   GFR: Estimated Creatinine Clearance: 55.7 mL/min (by C-G formula based on SCr of 0.78 mg/dL). Liver Function Tests: Recent Labs  Lab 10/16/19 1717  AST 28  ALT 26  ALKPHOS 65  BILITOT 0.6  PROT 7.2  ALBUMIN 4.0   No results for input(s): LIPASE, AMYLASE in the last 168 hours. No results for input(s): AMMONIA in the last 168 hours. Coagulation Profile: Recent Labs  Lab 10/16/19 1717  INR 1.1   Cardiac Enzymes: No  results for input(s): CKTOTAL, CKMB, CKMBINDEX, TROPONINI in the last 168 hours. BNP (last 3 results) No results for input(s): PROBNP in the last 8760 hours. HbA1C: No results for input(s): HGBA1C in the last 72 hours. CBG: Recent Labs  Lab 10/16/19 1716  GLUCAP 95   Lipid Profile: No results for input(s): CHOL, HDL, LDLCALC, TRIG, CHOLHDL, LDLDIRECT in the last 72 hours. Thyroid Function Tests: No results for input(s): TSH, T4TOTAL, FREET4, T3FREE, THYROIDAB in the last 72 hours. Anemia Panel: No results for input(s): VITAMINB12, FOLATE, FERRITIN, TIBC, IRON, RETICCTPCT in the last 72 hours. Urine analysis:    Component Value Date/Time   COLORURINE YELLOW (A) 04/24/2015 2126   APPEARANCEUR CLEAR (A) 04/24/2015 2126   APPEARANCEUR Clear 09/01/2014 2259   LABSPEC 1.011 04/24/2015 2126   LABSPEC 1.027 09/01/2014 2259   PHURINE 5.0 04/24/2015 2126   GLUCOSEU NEGATIVE 04/24/2015 2126   GLUCOSEU >=500 09/01/2014 2259   HGBUR NEGATIVE 04/24/2015 2126   BILIRUBINUR NEGATIVE 04/24/2015 2126   BILIRUBINUR Negative 09/01/2014 [place] 04/24/2015 2126   PROTEINUR NEGATIVE 04/24/2015 2126   NITRITE NEGATIVE 04/24/2015 2126   LEUKOCYTESUR NEGATIVE 04/24/2015 2126   LEUKOCYTESUR Negative 09/01/2014 2259    Radiological Exams on Admission: CT ANGIO HEAD W OR WO CONTRAST  Addendum Date: 10/16/2019   ADDENDUM REPORT: 10/16/2019 19:40 ADDENDUM: Study discussed by telephone with Dr. Kerman Passey in the ED on 10/16/2019 at 1933 hours. Electronically Signed   By: Genevie Ann M.D.   On: 10/16/2019 19:40   Result Date: 10/16/2019 CLINICAL DATA:  74 year old female code stroke presentation with left side weakness since 1530 or 1600 hours. EXAM: CT ANGIOGRAPHY HEAD AND NECK CT PERFUSION BRAIN TECHNIQUE: Multidetector CT imaging of the head and neck was performed using the standard protocol during bolus administration of intravenous contrast. Multiplanar CT image reconstructions and MIPs  were obtained to evaluate the vascular anatomy. Carotid stenosis measurements (when applicable) are obtained utilizing NASCET criteria, using the distal internal carotid diameter as the denominator. Multiphase CT imaging of the brain was performed following IV bolus contrast injection. Subsequent parametric perfusion maps were calculated using  RAPID software. CONTRAST:  14m OMNIPAQUE IOHEXOL 350 MG/ML SOLN COMPARISON:  Plain head CT 1724 hours today. FINDINGS: CT Brain Perfusion Findings: ASPECTS: 10, presumed chronic right MCA encephalomalacia. CBF (<30%) Volume: None, CBV appears increased in the area of abnormal T-max. Perfusion (Tmax>6.0s) volume: 139mMismatch Volume: 1021mnfarction Location:No infarct core, but ischemia localizes to the right frontal operculum and posterior right MCA territory. CT HEAD Brain: Repeat plain head CT images at 1847 hours. Abnormal cortical and white matter hypodensity in the posterior and middle right MCA territory appears stable from earlier. No associated hemorrhage or mass effect. As before there seems to be ex vacuo enlargement of the right lateral ventricle. Calvarium and skull base: Stable, negative. Paranasal sinuses: Visualized paranasal sinuses and mastoids are stable and well pneumatized. Orbits: No acute findings. CTA NECK Skeleton: Absent dentition. Prior cervical ACDF with solid arthrodesis. Advanced cervical spine degeneration elsewhere. No acute osseous abnormality identified. Upper chest: Centrilobular and to a lesser extent paraseptal emphysema. Calcified aortic atherosclerosis. No superior mediastinal lymphadenopathy. Other neck: No acute findings. Aortic arch: 3 vessel arch configuration. Mild great vessel origin atherosclerosis. Right carotid system: Brachiocephalic artery plaque without stenosis. Normal right CCA origin. Tortuous proximal right CCA with a kinked appearance at the thoracic inlet on series 508, image 119. Heavily calcified right ECA and ICA  origins with additional posterior right ICA origin soft plaque. Right ICA origin stenosis is 60-65 % with respect to the distal vessel. See series 508, image 101 and series 512, image 49. The right ICA remains patent to the skull base. Left carotid system: Negative left CCA origin. Tortuous proximal left CCA. Heavily calcified left carotid bifurcation and right ICA origin. RADIOGRAPHIC STRING SIGN STENOSIS just proximal to the bifurcation on series 512, image 98. Less than 50% additional proximal right ICA stenosis. The right ICA remains patent to the skull base. Vertebral arteries: Mild proximal right subclavian calcified plaque without stenosis. Normal right vertebral artery origin. Right vertebral artery is patent to the skull base without plaque or stenosis. Normal proximal left subclavian artery. Normal left vertebral artery origin. Tortuous left V1 segment. The left vertebral is mildly non dominant and patent to the skull base without stenosis. CTA HEAD Posterior circulation: Mild bilateral V4 calcified plaque without stenosis. The right V4 segment is mildly dominant. Patent PICA origins and vertebrobasilar junction without stenosis. Patent basilar artery without stenosis. Normal SCA and PCA origins. Posterior communicating arteries are diminutive or absent. Bilateral PCA branches are within normal limits. Anterior circulation: Both ICA siphons are patent. On the left there is moderate calcified plaque and moderate stenosis just distal to the anterior genu (series 509, image 225. Normal left ophthalmic artery origin. On the right moderate to severe calcified plaque most pronounced in the cavernous segment with moderate stenosis at the petrous and cavernous junction. Normal right ophthalmic artery origin. Patent carotid termini with normal MCA and ACA origins. Anterior communicating artery and bilateral ACA branches are within normal limits. Left MCA M1 segment and trifurcation are patent without stenosis. The  left MCA trifurcation is ectatic but there is no discrete saccular aneurysm. Left MCA branches are within normal limits. Right MCA M1 segment and bifurcation are patent without stenosis. In the dominant middle M2 division there is a high-grade stenosis or short segment occlusion best demonstrated on series 515, image 8 with preserved distal enhancement although the downstream middle MCA branches are highly diminutive. Venous sinuses: Patent. Anatomic variants: Dominant right vertebral artery. Review of the MIP images confirms the  above findings IMPRESSION: 1. Positive for Right MCA M2 middle division near-occlusion/critical stenosis (series 515, image 8). The MCA branches distal to the this are attenuated. However, CTP detects no infarct core and only 10 mL of Right MCA territory penumbra. Plus the plain CT appearance is that of chronic right MCA ischemia. Therefore, suspect this constellation reflects chronic more so than acute Right MCA vessel disease. 2. And furthermore, there are bilateral cervical ICA and ICA siphon hemodynamically significant stenoses: - Right ICA origin 60-65% stenosis. - Left carotid bifurcation RADIOGRAPHIC STRING SIGN STENOSIS. - Moderate bilateral ICA siphon stenosis due to calcified plaque. 3. Negative vertebral and posterior circulation. 4. Aortic Atherosclerosis (ICD10-I70.0) and Emphysema (ICD10-J43.9). Electronically Signed: By: Genevie Ann M.D. On: 10/16/2019 19:26   CT ANGIO NECK W OR WO CONTRAST  Addendum Date: 10/16/2019   ADDENDUM REPORT: 10/16/2019 19:40 ADDENDUM: Study discussed by telephone with Dr. Kerman Passey in the ED on 10/16/2019 at 1933 hours. Electronically Signed   By: Genevie Ann M.D.   On: 10/16/2019 19:40   Result Date: 10/16/2019 CLINICAL DATA:  74 year old female code stroke presentation with left side weakness since 1530 or 1600 hours. EXAM: CT ANGIOGRAPHY HEAD AND NECK CT PERFUSION BRAIN TECHNIQUE: Multidetector CT imaging of the head and neck was performed using  the standard protocol during bolus administration of intravenous contrast. Multiplanar CT image reconstructions and MIPs were obtained to evaluate the vascular anatomy. Carotid stenosis measurements (when applicable) are obtained utilizing NASCET criteria, using the distal internal carotid diameter as the denominator. Multiphase CT imaging of the brain was performed following IV bolus contrast injection. Subsequent parametric perfusion maps were calculated using RAPID software. CONTRAST:  155m OMNIPAQUE IOHEXOL 350 MG/ML SOLN COMPARISON:  Plain head CT 1724 hours today. FINDINGS: CT Brain Perfusion Findings: ASPECTS: 10, presumed chronic right MCA encephalomalacia. CBF (<30%) Volume: None, CBV appears increased in the area of abnormal T-max. Perfusion (Tmax>6.0s) volume: 141mMismatch Volume: 1052mnfarction Location:No infarct core, but ischemia localizes to the right frontal operculum and posterior right MCA territory. CT HEAD Brain: Repeat plain head CT images at 1847 hours. Abnormal cortical and white matter hypodensity in the posterior and middle right MCA territory appears stable from earlier. No associated hemorrhage or mass effect. As before there seems to be ex vacuo enlargement of the right lateral ventricle. Calvarium and skull base: Stable, negative. Paranasal sinuses: Visualized paranasal sinuses and mastoids are stable and well pneumatized. Orbits: No acute findings. CTA NECK Skeleton: Absent dentition. Prior cervical ACDF with solid arthrodesis. Advanced cervical spine degeneration elsewhere. No acute osseous abnormality identified. Upper chest: Centrilobular and to a lesser extent paraseptal emphysema. Calcified aortic atherosclerosis. No superior mediastinal lymphadenopathy. Other neck: No acute findings. Aortic arch: 3 vessel arch configuration. Mild great vessel origin atherosclerosis. Right carotid system: Brachiocephalic artery plaque without stenosis. Normal right CCA origin. Tortuous proximal  right CCA with a kinked appearance at the thoracic inlet on series 508, image 119. Heavily calcified right ECA and ICA origins with additional posterior right ICA origin soft plaque. Right ICA origin stenosis is 60-65 % with respect to the distal vessel. See series 508, image 101 and series 512, image 49. The right ICA remains patent to the skull base. Left carotid system: Negative left CCA origin. Tortuous proximal left CCA. Heavily calcified left carotid bifurcation and right ICA origin. RADIOGRAPHIC STRING SIGN STENOSIS just proximal to the bifurcation on series 512, image 98. Less than 50% additional proximal right ICA stenosis. The right ICA remains patent to the  skull base. Vertebral arteries: Mild proximal right subclavian calcified plaque without stenosis. Normal right vertebral artery origin. Right vertebral artery is patent to the skull base without plaque or stenosis. Normal proximal left subclavian artery. Normal left vertebral artery origin. Tortuous left V1 segment. The left vertebral is mildly non dominant and patent to the skull base without stenosis. CTA HEAD Posterior circulation: Mild bilateral V4 calcified plaque without stenosis. The right V4 segment is mildly dominant. Patent PICA origins and vertebrobasilar junction without stenosis. Patent basilar artery without stenosis. Normal SCA and PCA origins. Posterior communicating arteries are diminutive or absent. Bilateral PCA branches are within normal limits. Anterior circulation: Both ICA siphons are patent. On the left there is moderate calcified plaque and moderate stenosis just distal to the anterior genu (series 509, image 225. Normal left ophthalmic artery origin. On the right moderate to severe calcified plaque most pronounced in the cavernous segment with moderate stenosis at the petrous and cavernous junction. Normal right ophthalmic artery origin. Patent carotid termini with normal MCA and ACA origins. Anterior communicating artery and  bilateral ACA branches are within normal limits. Left MCA M1 segment and trifurcation are patent without stenosis. The left MCA trifurcation is ectatic but there is no discrete saccular aneurysm. Left MCA branches are within normal limits. Right MCA M1 segment and bifurcation are patent without stenosis. In the dominant middle M2 division there is a high-grade stenosis or short segment occlusion best demonstrated on series 515, image 8 with preserved distal enhancement although the downstream middle MCA branches are highly diminutive. Venous sinuses: Patent. Anatomic variants: Dominant right vertebral artery. Review of the MIP images confirms the above findings IMPRESSION: 1. Positive for Right MCA M2 middle division near-occlusion/critical stenosis (series 515, image 8). The MCA branches distal to the this are attenuated. However, CTP detects no infarct core and only 10 mL of Right MCA territory penumbra. Plus the plain CT appearance is that of chronic right MCA ischemia. Therefore, suspect this constellation reflects chronic more so than acute Right MCA vessel disease. 2. And furthermore, there are bilateral cervical ICA and ICA siphon hemodynamically significant stenoses: - Right ICA origin 60-65% stenosis. - Left carotid bifurcation RADIOGRAPHIC STRING SIGN STENOSIS. - Moderate bilateral ICA siphon stenosis due to calcified plaque. 3. Negative vertebral and posterior circulation. 4. Aortic Atherosclerosis (ICD10-I70.0) and Emphysema (ICD10-J43.9). Electronically Signed: By: Genevie Ann M.D. On: 10/16/2019 19:26   CT CEREBRAL PERFUSION W CONTRAST  Addendum Date: 10/16/2019   ADDENDUM REPORT: 10/16/2019 19:40 ADDENDUM: Study discussed by telephone with Dr. Kerman Passey in the ED on 10/16/2019 at 1933 hours. Electronically Signed   By: Genevie Ann M.D.   On: 10/16/2019 19:40   Result Date: 10/16/2019 CLINICAL DATA:  74 year old female code stroke presentation with left side weakness since 1530 or 1600 hours. EXAM: CT  ANGIOGRAPHY HEAD AND NECK CT PERFUSION BRAIN TECHNIQUE: Multidetector CT imaging of the head and neck was performed using the standard protocol during bolus administration of intravenous contrast. Multiplanar CT image reconstructions and MIPs were obtained to evaluate the vascular anatomy. Carotid stenosis measurements (when applicable) are obtained utilizing NASCET criteria, using the distal internal carotid diameter as the denominator. Multiphase CT imaging of the brain was performed following IV bolus contrast injection. Subsequent parametric perfusion maps were calculated using RAPID software. CONTRAST:  118m OMNIPAQUE IOHEXOL 350 MG/ML SOLN COMPARISON:  Plain head CT 1724 hours today. FINDINGS: CT Brain Perfusion Findings: ASPECTS: 10, presumed chronic right MCA encephalomalacia. CBF (<30%) Volume: None, CBV appears increased in  the area of abnormal T-max. Perfusion (Tmax>6.0s) volume: 57m Mismatch Volume: 171mInfarction Location:No infarct core, but ischemia localizes to the right frontal operculum and posterior right MCA territory. CT HEAD Brain: Repeat plain head CT images at 1847 hours. Abnormal cortical and white matter hypodensity in the posterior and middle right MCA territory appears stable from earlier. No associated hemorrhage or mass effect. As before there seems to be ex vacuo enlargement of the right lateral ventricle. Calvarium and skull base: Stable, negative. Paranasal sinuses: Visualized paranasal sinuses and mastoids are stable and well pneumatized. Orbits: No acute findings. CTA NECK Skeleton: Absent dentition. Prior cervical ACDF with solid arthrodesis. Advanced cervical spine degeneration elsewhere. No acute osseous abnormality identified. Upper chest: Centrilobular and to a lesser extent paraseptal emphysema. Calcified aortic atherosclerosis. No superior mediastinal lymphadenopathy. Other neck: No acute findings. Aortic arch: 3 vessel arch configuration. Mild great vessel origin  atherosclerosis. Right carotid system: Brachiocephalic artery plaque without stenosis. Normal right CCA origin. Tortuous proximal right CCA with a kinked appearance at the thoracic inlet on series 508, image 119. Heavily calcified right ECA and ICA origins with additional posterior right ICA origin soft plaque. Right ICA origin stenosis is 60-65 % with respect to the distal vessel. See series 508, image 101 and series 512, image 49. The right ICA remains patent to the skull base. Left carotid system: Negative left CCA origin. Tortuous proximal left CCA. Heavily calcified left carotid bifurcation and right ICA origin. RADIOGRAPHIC STRING SIGN STENOSIS just proximal to the bifurcation on series 512, image 98. Less than 50% additional proximal right ICA stenosis. The right ICA remains patent to the skull base. Vertebral arteries: Mild proximal right subclavian calcified plaque without stenosis. Normal right vertebral artery origin. Right vertebral artery is patent to the skull base without plaque or stenosis. Normal proximal left subclavian artery. Normal left vertebral artery origin. Tortuous left V1 segment. The left vertebral is mildly non dominant and patent to the skull base without stenosis. CTA HEAD Posterior circulation: Mild bilateral V4 calcified plaque without stenosis. The right V4 segment is mildly dominant. Patent PICA origins and vertebrobasilar junction without stenosis. Patent basilar artery without stenosis. Normal SCA and PCA origins. Posterior communicating arteries are diminutive or absent. Bilateral PCA branches are within normal limits. Anterior circulation: Both ICA siphons are patent. On the left there is moderate calcified plaque and moderate stenosis just distal to the anterior genu (series 509, image 225. Normal left ophthalmic artery origin. On the right moderate to severe calcified plaque most pronounced in the cavernous segment with moderate stenosis at the petrous and cavernous junction.  Normal right ophthalmic artery origin. Patent carotid termini with normal MCA and ACA origins. Anterior communicating artery and bilateral ACA branches are within normal limits. Left MCA M1 segment and trifurcation are patent without stenosis. The left MCA trifurcation is ectatic but there is no discrete saccular aneurysm. Left MCA branches are within normal limits. Right MCA M1 segment and bifurcation are patent without stenosis. In the dominant middle M2 division there is a high-grade stenosis or short segment occlusion best demonstrated on series 515, image 8 with preserved distal enhancement although the downstream middle MCA branches are highly diminutive. Venous sinuses: Patent. Anatomic variants: Dominant right vertebral artery. Review of the MIP images confirms the above findings IMPRESSION: 1. Positive for Right MCA M2 middle division near-occlusion/critical stenosis (series 515, image 8). The MCA branches distal to the this are attenuated. However, CTP detects no infarct core and only 10 mL of Right  MCA territory penumbra. Plus the plain CT appearance is that of chronic right MCA ischemia. Therefore, suspect this constellation reflects chronic more so than acute Right MCA vessel disease. 2. And furthermore, there are bilateral cervical ICA and ICA siphon hemodynamically significant stenoses: - Right ICA origin 60-65% stenosis. - Left carotid bifurcation RADIOGRAPHIC STRING SIGN STENOSIS. - Moderate bilateral ICA siphon stenosis due to calcified plaque. 3. Negative vertebral and posterior circulation. 4. Aortic Atherosclerosis (ICD10-I70.0) and Emphysema (ICD10-J43.9). Electronically Signed: By: Genevie Ann M.D. On: 10/16/2019 19:26   CT HEAD CODE STROKE WO CONTRAST  Addendum Date: 10/16/2019   ADDENDUM REPORT: 10/16/2019 17:49 ADDENDUM: Study discussed by telephone with Dr. Kerman Passey on 10/16/2019 at 1743 hours. Electronically Signed   By: Genevie Ann M.D.   On: 10/16/2019 17:49   Result Date:  10/16/2019 CLINICAL DATA:  Code stroke. 74 year old female with left side weakness since around 15 30 or 1600 hours today. EXAM: CT HEAD WITHOUT CONTRAST TECHNIQUE: Contiguous axial images were obtained from the base of the skull through the vertex without intravenous contrast. COMPARISON:  Head CT 04/24/2015. Brain MRI 09/02/2014. FINDINGS: Brain: Confluent new right frontal lobe white matter hypodensity, but associated with ex vacuo enlargement of the right lateral ventricle. There is new chronic appearing encephalomalacia of the right insula and operculum. Progressed cerebral white matter and deep gray matter hypodensity elsewhere in both hemispheres since 2016. No acute intracranial hemorrhage identified. No midline shift, mass effect, or evidence of intracranial mass lesion. Vascular: Calcified atherosclerosis at the skull base. No suspicious intracranial vascular hyperdensity. Skull: No acute osseous abnormality identified. Sinuses/Orbits: Visualized paranasal sinuses and mastoids are stable and well pneumatized. Other: No acute orbit or scalp soft tissue findings. ASPECTS [facility] Stroke Program Early CT Score) Total score (0-10 with 10 being normal): 10 (presuming chronic encephalomalacia in the right frontal lobe). IMPRESSION: 1. New since 2016 but chronic appearing ischemia in the right MCA territory. Underlying advanced chronic small vessel disease. 2. No or definite acute cortically based infarct. No acute intracranial hemorrhage. ASPECTS 10. Electronically Signed: By: Genevie Ann M.D. On: 10/16/2019 17:38    EKG: Independently reviewed.  Sinus rhythm, heart rate 90.  Artifact.  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 Patient was seen by telemetry neurology and not felt to be a candidate for TPA given lack of new disabling deficits on exam and inconsistent LKN and clinical history.  Follow-up CT angiogram revealed a right MCA M2 occlusion/critical stenosis which was felt to be chronic in nature CT cerebral perfusion study did not show an infarct core and perfusion deficit to only 10 ml.  Also noted to have left ICA proximal string sign representing critical stenosis. Patient was not felt to be a candidate for thrombectomy due to low NIH stroke scale.  -Neurology has been consulted -Consult vascular surgery in a.m. -Telemetry monitoring -MRI of the brain without contrast -2D echocardiogram -Hemoglobin A1c, fasting lipid panel -Aspirin 325 daily -High intensity statin -Frequent neurochecks -PT, OT, speech therapy. -N.p.o. until cleared by bedside swallow evaluation or formal speech evaluation  Non-insulin-dependent diabetes -Check A1c.  Sliding scale insulin sensitive ACHS  and CBG checks.  Hypertension -Allow permissive hypertension at this time, brain MRI pending  Hyperlipidemia -Continue statin -Lipid panel  Normocytic anemia Hemoglobin 11.4, previously normal.  MCV 93. -Check FOBT -Anemia panel  Tobacco use -NicoDerm patch -Counseling  Pharmacy med rec pending.  DVT prophylaxis: SCDs at this time, pending vascular surgery evaluation in a.m. Code Status: Patient wishes to be full code. Family Communication: No family available at this time. Disposition Plan: Anticipate discharge after clinical improvement. Consults called: Neurology (Dr. Lorraine Lax) Admission status: It is my clinical opinion that admission to INPATIENT is reasonable and necessary in this 74 y.o. female . presenting with worsening left-sided weakness in the setting of prior  stroke.  Imaging showing left ICA critical stenosis.  Needs vascular surgery evaluation and likely intervention during this hospitalization.  Given the aforementioned, the predictability of an adverse outcome is felt to be significant. I expect that the patient will require at least 2 midnights in the hospital to treat this condition.   The medical decision making on this patient was of high complexity and the patient is at high risk for clinical deterioration, therefore this is a level 3 visit.  Shela Leff MD [facility]  If 7PM-7AM, please contact night-coverage [email or web address] Password [facility]  10/17/2019, 4:56 AM

## 2019-10-17 NOTE — TOC Initial Note (Signed)
Transition of Care [facility]) - Initial/Assessment Note    Patient Details  Name: Kendra [name] MRN: [id number] Date of Birth: April 24, 1946  Transition of Care [facility]) CM/SW Contact:    Pollie Friar, RN Phone Number: 10/17/2019, 4:20 PM  Clinical Narrative:                 Pt is from home with son: [name]. CM spoke to patient and then [name] via phone. Choice provided for [facility] services and [facility] selected. Cory with [facility] accepted the referral. TOC following for further d/c needs.  Expected Discharge Plan: [place] Barriers to Discharge: Continued Medical Work up   Patient Goals and CMS Choice   CMS Medicare.gov Compare Post Acute Care list provided to:: Patient Represenative (must comment) Choice offered to / list presented to : Adult Children(son: Clif[place])  Expected Discharge Plan and Services Expected Discharge Plan: [place]   Discharge Planning Services: CM Consult                                 [facility] Agency: [place] Date [facility] Agency Contacted: 10/17/19   Representative spoke with at [place]: Tommi Rumps  Prior Living Arrangements/Services   Lives with:: Adult Children Patient language and need for interpreter reviewed:: Yes Do you feel safe going back to the place where you live?: Yes      Need for Family Participation in Patient Care: Yes (Comment) Care giver support system in place?: Yes (comment) Current home services: DME(walker) Criminal Activity/Legal Involvement Pertinent to Current Situation/Hospitalization: No - Comment as needed  Activities of Daily Living Home Assistive Devices/Equipment: Gilford Rile (specify type) ADL Screening (condition at time of admission) Patient's cognitive ability adequate to safely complete daily activities?: Yes Is the patient deaf or have difficulty hearing?: No Does the patient have difficulty seeing, even when wearing glasses/contacts?: Yes Does the patient have difficulty  concentrating, remembering, or making decisions?: No Patient able to express need for assistance with ADLs?: Yes Does the patient have difficulty dressing or bathing?: No Independently performs ADLs?: Yes (appropriate for developmental age) Does the patient have difficulty walking or climbing stairs?: Yes Weakness of Legs: Left Weakness of Arms/Hands: None  Permission Sought/Granted                  Emotional Assessment Appearance:: Appears stated age Attitude/Demeanor/Rapport: Engaged Affect (typically observed): Accepting Orientation: : Oriented to Self, Oriented to Place   Psych Involvement: No (comment)  Admission diagnosis:  Acute CVA (cerebrovascular accident) [name]) [I63.9] Patient Active Problem List   Diagnosis Date Noted  . 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 Weakness of left upper extremity 10/16/2019  . Left-sided weakness 10/16/2019  . Hypertension associated with diabetes ([place]) 12/19/2018  . Chronic midline low back pain without sciatica 05/07/2018  . Radiculopathy of leg 04/01/2017  . Calcification of aorta (HCC) 09/27/2016  . Early satiety 07/26/2016  . CMC arthritis, thumb, degenerative 07/16/2015  . Weight loss 06/07/2015  . Tenosynovitis of wrist 06/07/2015  . Protein calorie malnutrition ([place]) 06/07/2015  . Diabetes mellitus type 2, controlled, with complications ([place]) [id number]  . Cataract 03/30/2015  . Other and unspecified disc disorder of cervical region 03/30/2015  . Insomnia 03/30/2015  . Centrilobular emphysema ([place]) 03/30/2015  . Clinical depression 03/30/2015  . Hyperlipidemia associated with type 2 diabetes mellitus ([place]) 03/30/2015  . Gastro-esophageal reflux  disease without esophagitis 03/30/2015  . Increased thickness of nail 03/30/2015  . History of cerebrovascular accident with residual deficit 03/30/2015  . Osteoarthritis 03/30/2015  . Osteopenia 03/30/2015  . Allergic rhinitis  03/30/2015  . B12 deficiency 03/30/2015  . Tobacco use disorder 03/30/2015  . Vitamin D deficiency 03/30/2015  . Mycotic toenails 08/12/2014  . Diabetic peripheral neuropathy associated with type 2 diabetes mellitus ([place]) 08/12/2014   PCP:  [name], NP (Inactive) Pharmacy:   [facility], [address] [place] [address] Phone: [phone] Fax: [phone]  [id number] - [place], [address] [place] [address] Phone: [phone] Fax: [phone]     Social Determinants of Health (SDOH) Interventions    Readmission Risk Interventions No flowsheet data found.

## 2019-10-17 NOTE — Progress Notes (Signed)
  Echocardiogram 2D Echocardiogram has been performed.  Kendra [name] 10/17/2019, 12:52 PM

## 2019-10-17 NOTE — Evaluation (Signed)
Physical Therapy Evaluation Patient Details Name: Kendra [name] MRN: [id number] DOB: January 08, 1946 Today's Date: 10/17/2019   History of Present Illness  Kendra [name] is a 74 y.o. female with PMH of hypertension, hyperlipidemia, DM2, prior CVA with most recent stroke in March 2020 secondary to right M1 occlusion status post IV TPA and thrombectomy who presented with worsening weakness on the left side. MRI negative for acute finding. CTA showing occluded right M2 and severely stenosed left ICA. Pt not a candidate for mechnical thrombectomy as M2 occlusion/stenosis may be chronic.  Clinical Impression  Prior to admission, pt lives with her son, uses a walker for mobility, and requires assist for IADL's. Pt endorses history of multiple falls. On PT evaluation, pt presents with decreased functional mobility secondary to balance impairments, gait abnormalities, and left inattention. Pt ambulating 140 feet with a walker and up to min assist to avoid running into obstacles on the left side of the hallway. Would benefit from follow up HHPT to maximize functional mobility.    Follow Up Recommendations Home health PT;Supervision for mobility/OOB    Equipment Recommendations  None recommended by PT    Recommendations for Other Services       Precautions / Restrictions Precautions Precautions: Fall Restrictions Weight Bearing Restrictions: No      Mobility  Bed Mobility Overal bed mobility: Modified Independent                Transfers Overall transfer level: Needs assistance Equipment used: Rolling walker (2 wheeled) Transfers: Sit to/from Stand Sit to Stand: Supervision            Ambulation/Gait Ambulation/Gait assistance: Min guard;Min assist Gait Distance (Feet): 140 Feet Assistive device: Rolling walker (2 wheeled) Gait Pattern/deviations: Step-through pattern;Decreased stride length;Decreased weight shift to left;Drifts right/left Gait velocity: decreased    General Gait Details: Pt requiring mostly min guard for safety, up to min assist for maintaining walker on straight path. Tendency for left lateral drift and running into several objects in hallway  Stairs            Wheelchair Mobility    Modified Rankin (Stroke Patients Only) Modified Rankin (Stroke Patients Only) Pre-Morbid Rankin Score: Moderate disability Modified Rankin: Moderately severe disability     Balance Overall balance assessment: Needs assistance Sitting-balance support: Feet supported Sitting balance-Leahy Scale: Good     Standing balance support: Bilateral upper extremity supported Standing balance-Leahy Scale: Poor Standing balance comment: reliant on external support dynamically                             Pertinent Vitals/Pain Pain Assessment: No/denies pain Pain Score: 3  Faces Pain Scale: Hurts little more Pain Location: back Pain Descriptors / Indicators: Aching Pain Intervention(s): Monitored during session    Home Living Family/patient expects to be discharged to:: Private residence Living Arrangements: Children(son, daughter in Sports coach) Available Help at Discharge: Family Type of Home: Apartment Home Access: Level entry     Home Layout: One level Home Equipment: Environmental consultant - 2 wheels;Walker - 4 wheels;Cane - single point;Bedside commode;Shower seat      Prior Function Level of Independence: Needs assistance   Gait / Transfers Assistance Needed: household ambulator using a walker  ADL's / Homemaking Assistance Needed: sponge bathes, needs assist for IADL's  Comments: Enjoys doing puzzles     Hand Dominance   Dominant Hand: Right    Extremity/Trunk Assessment   Upper Extremity Assessment Upper Extremity Assessment:  RUE deficits/detail;LUE deficits/detail RUE Deficits / Details: Grossly 4/5 LUE Deficits / Details: Grossly 4/5    Lower Extremity Assessment Lower Extremity Assessment: RLE deficits/detail;LLE  deficits/detail RLE Deficits / Details: 5/5 LLE Deficits / Details: 5/5    Cervical / Trunk Assessment Cervical / Trunk Assessment: Other exceptions Cervical / Trunk Exceptions: Right lateral shift in standing  Communication   Communication: No difficulties  Cognition Arousal/Alertness: Awake/alert Behavior During Therapy: WFL for tasks assessed/performed Overall Cognitive Status: Impaired/Different from baseline Area of Impairment: Safety/judgement                         Safety/Judgement: Decreased awareness of deficits     General Comments: Decreased awareness of deficits i.e. running into objects on left side of hallway and not correcting      General Comments      Exercises     Assessment/Plan    PT Assessment Patient needs continued PT services  PT Problem List Decreased strength;Decreased activity tolerance;Decreased balance;Decreased mobility       PT Treatment Interventions DME instruction;Gait training;Functional mobility training;Therapeutic activities;Therapeutic exercise;Balance training;Patient/family education    PT Goals (Current goals can be found in the Care Plan section)  Acute Rehab PT Goals Patient Stated Goal: none stated; agreeable to therapy PT Goal Formulation: With patient Time For Goal Achievement: 10/31/19 Potential to Achieve Goals: Good    Frequency Min 3X/week   Barriers to discharge        Co-evaluation               AM-PAC PT "6 Clicks" Mobility  Outcome Measure Help needed turning from your back to your side while in a flat bed without using bedrails?: None Help needed moving from lying on your back to sitting on the side of a flat bed without using bedrails?: None Help needed moving to and from a bed to a chair (including a wheelchair)?: None Help needed standing up from a chair using your arms (e.g., wheelchair or bedside chair)?: None Help needed to walk in hospital room?: A Little Help needed climbing 3-5  steps with a railing? : A Little 6 Click Score: 22    End of Session Equipment Utilized During Treatment: Gait belt Activity Tolerance: Patient tolerated treatment well Patient left: in bed;with call bell/phone within reach;with bed alarm set Nurse Communication: Mobility status PT Visit Diagnosis: Unsteadiness on feet (R26.81);Other abnormalities of gait and mobility (R26.89);History of falling (Z91.81);Difficulty in walking, not elsewhere classified (R26.2)    Time: 1114-1140 PT Time Calculation (min) (ACUTE ONLY): 26 min   Charges:   PT Evaluation $PT Eval Moderate Complexity: 1 Mod PT Treatments $Therapeutic Activity: 8-22 mins        Kendra [name], PT, DPT Acute Rehabilitation Services Pager [phone] Office [phone]   Deno Etienne 10/17/2019, 1:13 PM

## 2019-10-17 NOTE — Consult Note (Addendum)
Hospital Consult    Reason for Consult:  Left sided weakness; bilateral carotid artery stenosis Requesting Physician: [facility], Neurology MRN #:  [id number]  History of Present Illness: This is a 74 y.o. female with past medical history significant for type 2 diabetes mellitus, hypertension, hyperlipidemia, COPD, history of 3 prior strokes, and tobacco use who presented to [name] ED with new onset left arm and leg weakness on 10/16/2019.  Work-up included CTA neck which demonstrated 60 to 65% stenosis of right ICA and a radiographic string sign of left cervical ICA.  CTA also demonstrating near occlusive/critical intracranial stenosis of right MCA however based on reading by radiologist this may reflect a more chronic vessel disease.  Work-up also included MRI brain which was negative for acute CVA however did demonstrate large prior right hemispheric infarct.  On exam today she states left arm and left leg function is almost back to 100%.  She had already been on aspirin and statin daily.  Neurology added Plavix during current hospitalization.  Patient denies any nonhealing wounds, rest pain, or claudication of bilateral lower extremities.  She is not taking any other blood thinners.  She is a current smoker trying to quit and states she is down to 1 cigarette/day.  Past Medical History:  Diagnosis Date  . Back pain   . Cataract   . Cervical disc disorder   . Chest pain   . COPD (chronic obstructive pulmonary disease) ([place])   . Depression   . Diabetes ([place])   . Diabetic neuropathy ([place])   . GERD (gastroesophageal reflux disease)   . Hyperlipidemia   . Insomnia   . Neuropathy   . Osteoarthritis   . Osteopenia   . Reflux   . Rheumatoid arthritis ([place])   . Sleep apnea    no CPAP and no suggestion that she needed one per pt  . Stroke ([place]) 2015   and again 2017  - Weakness in left leg, staggers w/ walking  . Tenosynovitis     Past Surgical History:  Procedure Laterality Date  . BREAST  BIOPSY    . NECK SURGERY    . OVARIAN CYST REMOVAL      Allergies  Allergen Reactions  . Citalopram Nausea And Vomiting    Prior to Admission medications   Medication Sig Start Date End Date Taking? Authorizing Provider  acetaminophen (TYLENOL) 500 MG tablet Take 2 tablets (1,000 mg total) by mouth every 8 (eight) hours as needed for mild pain or moderate pain. 12/20/18   [name], NP  albuterol (VENTOLIN HFA) 108 (90 Base) MCG/ACT inhaler INHALE 1 TO 2 PUFFS INTO THE LUNGS EVERY 6 HOURS AS NEEDED FOR WHEEZING OR SHORTNESS OF BREATH Patient taking differently: Inhale 1-2 puffs into the lungs every 6 (six) hours as needed for wheezing or shortness of breath.  06/18/19   [name], NP  aspirin 81 MG EC tablet Take 1 tablet (81 mg total) by mouth daily. Swallow whole. 12/20/18   [name], NP  atorvastatin (LIPITOR) 40 MG tablet TAKE 1 TABLET BY MOUTH ONCE DAILY Patient taking differently: Take 40 mg by mouth daily.  08/04/19   Karamalegos, Devonne Doughty, DO  baclofen (LIORESAL) 10 MG tablet TAKE 1 TABLET BY MOUTH ONCE DAILY AS NEEDED MUSCLE SPASMS Patient not taking: Reported on 10/16/2019 05/07/19   [name], NP  Blood Glucose Monitoring Suppl (ONETOUCH VERIO) w/Device KIT 1 kit by Does not apply route 2 (two) times daily. 12/20/18  [name], NP  buPROPion (WELLBUTRIN XL) 150 MG 24 hr tablet TAKE 1 TABLET BY MOUTH ONCE DAILY Patient taking differently: Take 150 mg by mouth daily.  08/04/19   Karamalegos, Devonne Doughty, DO  Cholecalciferol (VITAMIN D3) 50 MCG (2000 UT) capsule Take 1 capsule (2,000 Units total) by mouth daily. 12/20/18   [name], NP  Fluticasone-Umeclidin-Vilant (TRELEGY ELLIPTA) 100-62.5-25 MCG/INH AEPB Inhale 1 puff into the lungs daily. 08/27/19   Karamalegos, Devonne Doughty, DO  glucose blood (ONETOUCH VERIO) test strip Use as instructed 12/20/18   [name], NP  Insulin Pen Needle (BD PEN NEEDLE NANO  U/F) 32G X 4 MM MISC USE WITH LANTUS AS DIRECTED 12/20/18   [name], NP  Lancet Devices (ONE TOUCH DELICA LANCING DEV) MISC 1 Device by Does not apply route 2 (two) times daily. 12/20/18   [name], NP  lisinopril (ZESTRIL) 10 MG tablet Take 1 tablet (10 mg total) by mouth daily. 09/22/19   Karamalegos, Devonne Doughty, DO  meloxicam (MOBIC) 15 MG tablet TAKE 1 TABLET BY MOUTH ONCE DAILY AS NEEDED Patient not taking: Reported on 10/16/2019 08/04/19   Olin Hauser, DO  metFORMIN (GLUCOPHAGE-XR) 500 MG 24 hr tablet Take 1 tablet (500 mg total) by mouth 2 (two) times daily before a meal. 08/27/19   Karamalegos, Devonne Doughty, DO  nicotine polacrilex (COMMIT) 4 MG lozenge Take 4 mg by mouth as needed for smoking cessation.    [provider]  OneTouch Delica Lancets 42H MISC 1 Device by Does not apply route 2 (two) times daily. 12/20/18   [name], NP  polyethylene glycol powder (GLYCOLAX/MIRALAX) 17 GM/SCOOP powder Take 17-34 g by mouth daily as needed for mild constipation or moderate constipation. 03/05/19   Olin Hauser, DO    Social History   Socioeconomic History  . Marital status: Widowed    Spouse name: Not on file  . Number of children: Not on file  . Years of education: Not on file  . Highest education level: Not on file  Occupational History  . Not on file  Tobacco Use  . Smoking status: Current Some Day Smoker    Packs/day: 0.25    Types: Cigarettes  . Smokeless tobacco: Current User  Substance and Sexual Activity  . Alcohol use: No  . Drug use: No  . Sexual activity: Never  Other Topics Concern  . Not on file  Social History Narrative  . Not on file   Social Determinants of Health   Financial Resource Strain:   . Difficulty of Paying Living Expenses: Not on file  Food Insecurity:   . Worried About Charity fundraiser in the Last Year: Not on file  . Ran Out of Food in the Last Year: Not on file  Transportation  Needs:   . Lack of Transportation (Medical): Not on file  . Lack of Transportation (Non-Medical): Not on file  Physical Activity:   . Days of Exercise per Week: Not on file  . Minutes of Exercise per Session: Not on file  Stress:   . Feeling of Stress : Not on file  Social Connections:   . Frequency of Communication with Friends and Family: Not on file  . Frequency of Social Gatherings with Friends and Family: Not on file  . Attends Religious Services: Not on file  . Active Member of Clubs or Organizations: Not on file  . Attends Archivist Meetings: Not on file  .  Marital Status: Not on file  Intimate Partner Violence:   . Fear of Current or Ex-Partner: Not on file  . Emotionally Abused: Not on file  . Physically Abused: Not on file  . Sexually Abused: Not on file     Family History  Problem Relation Age of Onset  . Stroke Father   . Depression Father   . Diabetes Mother   . Hyperlipidemia Mother   . Drug abuse Sister   . Heart murmur Son   . Drug abuse Sister   . Heart murmur Son   . Heart murmur Son     ROS: Otherwise negative unless mentioned in HPI  Physical Examination  Vitals:   10/17/19 0546  BP: 125/80  Pulse: 93  Resp: 18  Temp: 98.4 F (36.9 C)  SpO2: 100%   There is no height or weight on file to calculate BMI.  General:  WDWN in NAD Gait: Not observed HENT: WNL, normocephalic Pulmonary: normal non-labored breathing Cardiac: regular Abdomen:  soft, NT/ND, no masses Skin: without rashes Vascular Exam/Pulses: Symmetrical radial pulses Extremities: Unable to palpate pedal pulses however no tissue loss noted and feet are symmetrically warm to touch Musculoskeletal: no muscle wasting or atrophy  Neurologic: A&O X 3; grip strength symmetrical Psychiatric:  The pt has Normal affect. Lymph:  Unremarkable  CBC    Component Value Date/Time   WBC 6.3 10/16/2019 1717   RBC 3.48 (L) 10/17/2019 0606   RBC 3.75 (L) 10/16/2019 1717   HGB  11.4 (L) 10/16/2019 1717   HGB 12.7 09/01/2014 2130   HCT 35.2 (L) 10/16/2019 1717   HCT 37.9 09/01/2014 2130   PLT 230 10/16/2019 1717   PLT 260 09/05/2014 0517   MCV 93.9 10/16/2019 1717   MCV 92 09/01/2014 2130   MCH 30.4 10/16/2019 1717   MCHC 32.4 10/16/2019 1717   RDW 13.3 10/16/2019 1717   RDW 13.9 09/01/2014 2130   LYMPHSABS 2.1 10/16/2019 1717   MONOABS 0.3 10/16/2019 1717   EOSABS 0.3 10/16/2019 1717   BASOSABS 0.0 10/16/2019 1717    BMET    Component Value Date/Time   NA 141 10/16/2019 1717   NA 141 01/18/2016 1150   NA 138 09/01/2014 2130   K 3.7 10/16/2019 1717   K 3.5 09/01/2014 2130   CL 106 10/16/2019 1717   CL 104 09/01/2014 2130   CO2 25 10/16/2019 1717   CO2 28 09/01/2014 2130   GLUCOSE 102 (H) 10/16/2019 1717   GLUCOSE 287 (H) 09/01/2014 2130   BUN 14 10/16/2019 1717   BUN 12 01/18/2016 1150   BUN 14 09/01/2014 2130   CREATININE 0.78 10/16/2019 1717   CREATININE 0.80 09/30/2018 1151   CALCIUM 9.6 10/16/2019 1717   CALCIUM 8.9 09/01/2014 2130   GFRNONAA >60 10/16/2019 1717   GFRNONAA 75 09/30/2018 1151   GFRAA >60 10/16/2019 1717   GFRAA 87 09/30/2018 1151    COAGS: Lab Results  Component Value Date   INR 1.1 10/16/2019   INR 1.27 04/24/2015     Non-Invasive Vascular Imaging:   MRI brain negative for acute CVA; old infarct right hemisphere noted  CTA demonstrates critical intracranial right MCA stenosis, Right ICA 60 to 65% stenosis Left ICA radiographic string sign  Statin:  Yes.   Beta Blocker:  No. Aspirin:  Yes.   ACEI:  Yes.   ARB:  No. CCB use:  No Other antiplatelets/anticoagulants:  Yes.   plavix   ASSESSMENT/PLAN: This is a 74 y.o. female  with bilateral ICA stenosis; new onset left arm and leg weakness now resolved  MRI brain negative for acute CVA CTA demonstrates occlusive/critical intracranial right MCA stenosis, 60 to 65% right ICA stenosis, and left ICA string sign Clinically patient states left arm and leg are  just about 100% back to normal Critical L ICA stenosis not matching left sided symptoms Carotid duplex ordered to further evaluate degree of R and L ICA stenosis This case will be discussed with on-call vascular surgeon Dr. Donzetta Matters who will evaluate the patient later today   Dagoberto Ligas PA-C [facility] [phone]   I have independently interviewed and examined patient and agree with PA assessment and plan above.  I reviewed the CT scan as well as carotid duplex.  Certainly by CT there appears to be moderate stenosis of the affected right ICA with high-grade stenosis of the left ICA which is the asymptomatic side.  By duplex there is mild and moderate stenosis on the right and left sides respectively.  Patient symptoms have resolved at this time.  She does not appear to be a candidate on either side for transcarotid stenting given the short distance from her clavicle to the carotid bifurcation.  She also has a short neck but is able to extend her neck on physical exam.  This would make her possible candidate for carotid endarterectomy.  I will discuss with neurology best plan for proceeding given confusing picture of no current symptoms and incongruent CT scan and duplex.  Avryl Roehm C. Donzetta Matters, MD [facility] [place] Office: [phone] Pager: [phone]   Addendum: Discussed with neurology.  We will plan for right carotid endarterectomy early next week.  Okay to continue aspirin and Plavix through procedure. Will likely require interval left CEA in the future.   Brycelynn Stampley C. Donzetta Matters, MD

## 2019-10-17 NOTE — Progress Notes (Signed)
Ch responded to Stroke PG. Ch was present while initial tests were being run. Informed Nurse to Pg again if Ch will be needed.   10/16/19 1717  Clinical Encounter Type  Visited With Patient;Health care provider  Visit Type Initial  Referral From Nurse  Consult/Referral To Chaplain

## 2019-10-17 NOTE — Evaluation (Addendum)
Speech Language Pathology Evaluation Patient Details Name: Kendra [name] MRN: [id number] DOB: 04-09-1946 Today's Date: 10/17/2019 Time: [id number] SLP Time Calculation (min) (ACUTE ONLY): 19 min  Problem List:  Patient Active Problem List   Diagnosis Date Noted  . 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 Weakness of left upper extremity 10/16/2019  . Left-sided weakness 10/16/2019  . Hypertension associated with diabetes ([place]) 12/19/2018  . Chronic midline low back pain without sciatica 05/07/2018  . Radiculopathy of leg 04/01/2017  . Calcification of aorta (H[facility]) 09/27/2016  . Early satiety 07/26/2016  . CMC arthritis, thumb, degenerative 07/16/2015  . Weight loss 06/07/2015  . Tenosynovitis of wrist 06/07/2015  . Protein calorie malnutrition ([place]) 06/07/2015  . Diabetes mellitus type 2, controlled, with complications ([place]) [id number]  . Cataract 03/30/2015  . Other and unspecified disc disorder of cervical region 03/30/2015  . Insomnia 03/30/2015  . Centrilobular emphysema ([place]) 03/30/2015  . Clinical depression 03/30/2015  . Hyperlipidemia associated with type 2 diabetes mellitus ([place]) 03/30/2015  . Gastro-esophageal reflux disease without esophagitis 03/30/2015  . Increased thickness of nail 03/30/2015  . History of cerebrovascular accident with residual deficit 03/30/2015  . Osteoarthritis 03/30/2015  . Osteopenia 03/30/2015  . Allergic rhinitis 03/30/2015  . B12 deficiency 03/30/2015  . Tobacco use disorder 03/30/2015  . Vitamin D deficiency 03/30/2015  . Mycotic toenails 08/12/2014  . Diabetic peripheral neuropathy associated with type 2 diabetes mellitus ([place]) 08/12/2014   Past Medical History:  Past Medical History:  Diagnosis Date  . Back pain   . Cataract   . Cervical disc disorder   . Chest pain   . COPD (chronic obstructive pulmonary disease) ([place])   . Depression   . Diabetes ([place])   . Diabetic neuropathy ([place])    . GERD (gastroesophageal reflux disease)   . Hyperlipidemia   . Insomnia   . Neuropathy   . Osteoarthritis   . Osteopenia   . Reflux   . Rheumatoid arthritis ([place])   . Sleep apnea    no CPAP and no suggestion that she needed one per pt  . Stroke ([place]) 2015   and again 2017  - Weakness in left leg, staggers w/ walking  . Tenosynovitis    Past Surgical History:  Past Surgical History:  Procedure Laterality Date  . BREAST BIOPSY    . NECK SURGERY    . OVARIAN CYST REMOVAL     HPI:  Kendra [name] is a 74 y.o. female with PMH of hypertension, hyperlipidemia, DM2, prior CVA with most recent stroke in March 2020 secondary to right M1 occlusion status post IV TPA and thrombectomy and residual left hemiparesis presented [facility] ED with worsening weakness on the left side.  She was evaluated by tele-neurology who felt the patient was not a candidate for TPA . CT head did not show acute findings, redemonstrated chronic right MCA infarct.  CTA showed an occluded right M2 and the perfusion deficit of 10 cc on CTP with no core.  CT angiogram also showed a severely stenosed left ICA with string sign. Teleneurologist discussed case with Neurology at [facility] (myself), patient not a candidate for mechanical thrombectomy.  Patient was transferred to [facility] given the complexity of her case,  close monitoring and evaluation of left ICA.  Assessment / Plan / Recommendation Clinical Impression   Patient received at bedside for speech/language evaluation. Patient alert and oriented x4. Speech, receptive and expressive language skills and cognitive-communication skills appear to  be WFL.Patient's speech is fluent and clear without articulatory imprecision or distortion. No evidence of dysarthria or motor speech impairment. Patient able to engage in confrontation naming task with 100% accuracy. She is able to answer basic and complex yes/no questions accurately and follow up to 3 step verbal commands without  difficulty. Automatic speech and repetition tasks were completed without evidence of impairment.   Portions of the COGNISTAT were administered to assist with assessment cognitive-linguistic skills. Patient with scores as follows:  COGNISTAT Orientation: 12/12 [facility] Attention: 7/8 [facility] Memory: 9/12 WFL Reasoning: Similarities: 6/8 WFL Reasoning: Judgement: 4/6 WFL   No ST services are warranted based on today's assessment. Patient reports she lives with her son and daughter-in-law. Patient educated re: results and verbalized agreement. Patient education provided re: signs of a stroke and BEFAST acronym. Pt verbalized understanding.   Please re-consult should new needs arise.     SLP Assessment  SLP Recommendation/Assessment: Patient does not need any further Speech Lanaguage Pathology Services    Follow Up Recommendations    None   Frequency and Duration   N/A        SLP Evaluation Cognition  Overall Cognitive Status: Within Functional Limits for tasks assessed Arousal/Alertness: Awake/alert Orientation Level: Oriented X4 Attention: Sustained Sustained Attention: Appears intact Memory: Appears intact Problem Solving: Appears intact Safety/Judgment: Appears intact       Comprehension  Auditory Comprehension Overall Auditory Comprehension: Appears within functional limits for tasks assessed Yes/No Questions: Within Functional Limits Commands: Within Functional Limits Reading Comprehension Reading Status: Within funtional limits    Expression Expression Primary Mode of Expression: Verbal Verbal Expression Overall Verbal Expression: Appears within functional limits for tasks assessed Initiation: No impairment Automatic Speech: Counting Level of Generative/Spontaneous Verbalization: Conversation Repetition: No impairment Naming: No impairment Written Expression Dominant Hand: Right Written Expression: Not tested   Oral / Motor  Oral Motor/Sensory Function Overall  Oral Motor/Sensory Function: Within functional limits Motor Speech Overall Motor Speech: Appears within functional limits for tasks assessed   [place], M.Ed., CCC-SLP Speech Therapy Acute Rehabilitation [phone]: Acute Rehab office [phone] - pager   Annalisa Colonna 10/17/2019, 10:07 AM

## 2019-10-18 ENCOUNTER — Encounter (HOSPITAL_COMMUNITY): Payer: Self-pay | Admitting: Internal Medicine

## 2019-10-18 LAB — GLUCOSE, CAPILLARY
Glucose-Capillary: 87 mg/dL (ref 70–99)
Glucose-Capillary: 90 mg/dL (ref 70–99)
Glucose-Capillary: 86 mg/dL (ref 70–99)

## 2019-10-18 MED ORDER — CYANOCOBALAMIN 1000 MCG/ML IJ SOLN
1000.0000 ug | INTRAMUSCULAR | Status: DC
  Administered 2019-10-18: 1000 ug via INTRAMUSCULAR
  Filled 2019-10-18: qty 1

## 2019-10-18 NOTE — Evaluation (Signed)
Occupational Therapy Evaluation Patient Details Name: Kendra [name] MRN: [id number] DOB: Oct 12, 1945 Today's Date: 10/18/2019    History of Present Illness Kendra [name] is a 74 y.o. female with PMH of hypertension, hyperlipidemia, DM2, prior CVA with most recent stroke in March 2020 secondary to right M1 occlusion status post IV TPA and thrombectomy who presented with worsening weakness on the left side. MRI negative for acute finding. CTA showing occluded right M2 and severely stenosed left ICA. Pt not a candidate for mechnical thrombectomy as M2 occlusion/stenosis may be chronic.   Clinical Impression   This 74 y/o female presents with the above. PTA pt living with family, reports use of RW for household mobility, reports family assists with bathing ADL. Pt tolerating room level mobility using RW overall at supervision level today. Completing toileting, standing grooming ADL with minguard - supervision throughout. Pt with LUE noted mildly weaker than RUE, overall able to perform necessary functional tasks without difficulty. Pt requiring min cues for safety and safe RW use as she occasionally leaves RW behind during standing ADL/mobility tasks. Pt will benefit from continued acute OT services to maximize her safety and independence with ADL and mobility prior to return home. Do not anticipate pt will require follow up OT services. Will follow.     Follow Up Recommendations  No OT follow up;Supervision/Assistance - 24 hour    Equipment Recommendations  None recommended by OT           Precautions / Restrictions Precautions Precautions: Fall Restrictions Weight Bearing Restrictions: No      Mobility Bed Mobility Overal bed mobility: Modified Independent                Transfers Overall transfer level: Needs assistance Equipment used: Rolling walker (2 wheeled) Transfers: Sit to/from Stand Sit to Stand: Supervision         General transfer comment: VCs hand  placement, sit<>stand from EOB, recliner and toilet     Balance Overall balance assessment: Needs assistance Sitting-balance support: Feet supported Sitting balance-Leahy Scale: Good     Standing balance support: Bilateral upper extremity supported Standing balance-Leahy Scale: Fair Standing balance comment: able to statically stand while using hand sanitizer                            ADL either performed or assessed with clinical judgement   ADL Overall ADL's : Needs assistance/impaired Eating/Feeding: Modified independent;Sitting Eating/Feeding Details (indicate cue type and reason): assisted with dialing to order dinner Grooming: Wash/dry face;Brushing hair;Supervision/safety;Standing Grooming Details (indicate cue type and reason): standing at sink in room Upper Body Bathing: Set up;Supervision/ safety;Sitting   Lower Body Bathing: Min guard;Sit to/from stand   Upper Body Dressing : Sitting;Set up;Supervision/safety   Lower Body Dressing: Min guard;Sit to/from stand   Toilet Transfer: Supervision/safety;Ambulation;RW;Regular Toilet   Toileting- Water quality scientist and Hygiene: Supervision/safety;Sitting/lateral lean;Sit to/from stand Toileting - Water quality scientist Details (indicate cue type and reason): pt performing clothing management (gown and briefs) and pericare without assist     Functional mobility during ADLs: Supervision/safety;Rolling walker General ADL Comments: intermittent cues for safety and RW use     Vision Baseline Vision/History: Cataracts;Wears glasses Wears Glasses: At all times Vision Assessment?: Yes Eye Alignment: Within Functional Limits Ocular Range of Motion: Within Functional Limits Alignment/Gaze Preference: Within Defined Limits Tracking/Visual Pursuits: Able to track stimulus in all quads without difficulty     Perception     Praxis  Pertinent Vitals/Pain Pain Assessment: No/denies pain     Hand Dominance  Right   Extremity/Trunk Assessment Upper Extremity Assessment Upper Extremity Assessment: Generalized weakness RUE Deficits / Details: Grossly 4/5 LUE Deficits / Details: Grossly 4-/5   Lower Extremity Assessment Lower Extremity Assessment: Defer to PT evaluation       Communication Communication Communication: No difficulties   Cognition Arousal/Alertness: Awake/alert Behavior During Therapy: WFL for tasks assessed/performed Overall Cognitive Status: Impaired/Different from baseline Area of Impairment: Safety/judgement                         Safety/Judgement: Decreased awareness of deficits;Decreased awareness of safety     General Comments: requires cues for safety, intermittently leaving RW behind    General Comments       Exercises     Shoulder Instructions      Home Living Family/patient expects to be discharged to:: Private residence Living Arrangements: Children(son, daughter in law) Available Help at Discharge: Family Type of Home: Apartment Home Access: Level entry     Home Layout: One level     Bathroom Shower/Tub: Occupational psychologist: Standard     Home Equipment: Environmental consultant - 2 wheels;Walker - 4 wheels;Cane - single point;Bedside commode;Shower seat          Prior Functioning/Environment Level of Independence: Needs assistance  Gait / Transfers Assistance Needed: household ambulator using a walker ADL's / Homemaking Assistance Needed: sponge bathes (family assists with bathing), needs assist for IADL's   Comments: Enjoys doing puzzles        OT Problem List: Decreased strength;Decreased range of motion;Decreased activity tolerance;Impaired balance (sitting and/or standing);Decreased knowledge of use of DME or AE;Decreased safety awareness;Impaired vision/perception      OT Treatment/Interventions: Self-care/ADL training;Therapeutic exercise;Energy conservation;DME and/or AE instruction;Therapeutic activities;Cognitive  remediation/compensation;Visual/perceptual remediation/compensation;Patient/family education;Balance training    OT Goals(Current goals can be found in the care plan section) Acute Rehab OT Goals Patient Stated Goal: home when able OT Goal Formulation: With patient Time For Goal Achievement: 11/01/19 Potential to Achieve Goals: Good  OT Frequency: Min 2X/week   Barriers to D/C:            Co-evaluation              AM-PAC OT "6 Clicks" Daily Activity     Outcome Measure Help from another person eating meals?: None Help from another person taking care of personal grooming?: A Little Help from another person toileting, which includes using toliet, bedpan, or urinal?: A Little Help from another person bathing (including washing, rinsing, drying)?: A Little Help from another person to put on and taking off regular upper body clothing?: None Help from another person to put on and taking off regular lower body clothing?: A Little 6 Click Score: 20   End of Session Equipment Utilized During Treatment: Gait belt;Rolling walker Nurse Communication: Mobility status  Activity Tolerance: Patient tolerated treatment well Patient left: in chair;with call bell/phone within reach;with chair alarm set  OT Visit Diagnosis: Muscle weakness (generalized) (M62.81);Unsteadiness on feet (R26.81);Other symptoms and signs involving the nervous system (R29.898)                Time: 1450-1520 OT Time Calculation (min): 30 min Charges:  OT General Charges $OT Visit: 1 Visit OT Evaluation $OT Eval Moderate Complexity: 1 Mod OT Treatments $Self Care/Home Management : 8-22 mins  Lou Cal, OT [facility] Pager [phone] Office [phone]   Bubba Hales Lorinda Creed  10/18/2019, 3:28 PM

## 2019-10-18 NOTE — Progress Notes (Signed)
  Progress Note    10/18/2019 8:23 AM * No surgery date entered *  Subjective: No overnight issues, she is currently enjoying breakfast  Vitals:   10/18/19 0808 10/18/19 0808  BP: 100/63 100/63  Pulse: 79 80  Resp: 20 20  Temp: 98.4 F (36.9 C) 98.4 F (36.9 C)  SpO2:  96%    Physical Exam: Awake alert oriented Nonlabored respirations Moving all extremities without limitation  CBC    Component Value Date/Time   WBC 6.3 10/16/2019 1717   RBC 3.48 (L) 10/17/2019 0606   RBC 3.75 (L) 10/16/2019 1717   HGB 11.4 (L) 10/16/2019 1717   HGB 12.7 09/01/2014 2130   HCT 35.2 (L) 10/16/2019 1717   HCT 37.9 09/01/2014 2130   PLT 230 10/16/2019 1717   PLT 260 09/05/2014 0517   MCV 93.9 10/16/2019 1717   MCV 92 09/01/2014 2130   MCH 30.4 10/16/2019 1717   MCHC 32.4 10/16/2019 1717   RDW 13.3 10/16/2019 1717   RDW 13.9 09/01/2014 2130   LYMPHSABS 2.1 10/16/2019 1717   MONOABS 0.3 10/16/2019 1717   EOSABS 0.3 10/16/2019 1717   BASOSABS 0.0 10/16/2019 1717    BMET    Component Value Date/Time   NA 141 10/16/2019 1717   NA 141 01/18/2016 1150   NA 138 09/01/2014 2130   K 3.7 10/16/2019 1717   K 3.5 09/01/2014 2130   CL 106 10/16/2019 1717   CL 104 09/01/2014 2130   CO2 25 10/16/2019 1717   CO2 28 09/01/2014 2130   GLUCOSE 102 (H) 10/16/2019 1717   GLUCOSE 287 (H) 09/01/2014 2130   BUN 14 10/16/2019 1717   BUN 12 01/18/2016 1150   BUN 14 09/01/2014 2130   CREATININE 0.78 10/16/2019 1717   CREATININE 0.80 09/30/2018 1151   CALCIUM 9.6 10/16/2019 1717   CALCIUM 8.9 09/01/2014 2130   GFRNONAA >60 10/16/2019 1717   GFRNONAA 75 09/30/2018 1151   GFRAA >60 10/16/2019 1717   GFRAA 87 09/30/2018 1151    INR    Component Value Date/Time   INR 1.1 10/16/2019 1717     Intake/Output Summary (Last 24 hours) at 10/18/2019 0823 Last data filed at 10/17/2019 1516 Gross per 24 hour  Intake 701.6 ml  Output --  Net 701.6 ml     Assessment/plan:  74 y.o. female is  with symptomatic right ICA lesion.  Also has string sign of the left ICA.  We will plan for right carotid endarterectomy as inpatient likely Tuesday of this week.  Continue aspirin Plavix through the procedure    Nikitas Davtyan C. Donzetta Matters, MD [facility] Office: [phone] Pager: [phone]  10/18/2019 8:23 AM \

## 2019-10-18 NOTE — Progress Notes (Signed)
PROGRESS NOTE  Kendra [name] [id number] DOB: 07/04/1946 DOA: 10/17/2019 PCP: [name], NP (Inactive)  HPI/Recap of past 24 hours: Kendra [name] is a 74 y.o. female with past medical history of hypertension, hyperlipidemia, diabetes mellitus type 2, prior CVA with most recent stroke in March 2020 secondary to right M1 occlusion status post IV TPA and thrombectomy and residual left hemiparesis presents to the ED at [name] with worsening weakness on the left side.  Patient stated that she was doing well when suddenly around 3 PM she started to have increased weakness on the left side associated with headache with decreased ability /inability to walk.  She was evaluated by teleneurology and felt that she was not a candidate for TPA.  CTA did not show acute finding but showed chronic right MCA infarct and CT angiogram also showed a severely stenosed left ICA with string sign Patient seen and examined at bedside.  States she is doing much better she is just returning to her bedside with PT present now   Subjective: Patient seen and examined at bedside.  States she is doing much better she is just returning to her bedside with PT present now  October 18, 2019 Subjective Patient seen and examined at bedside she denies any complaints.    Assessment/Plan: Principal Problem:   Stenosis of intracranial portions of left internal carotid artery Active Problems:   Hyperlipidemia associated with type 2 diabetes mellitus (HCC)   Tobacco use disorder   Diabetes mellitus type 2, controlled, with complications (HCC)   Left-sided weakness  1.  Worsening left-sided weakness secondary to right MCA infarct.  She is not a candidate for TPA.  She has been seen by [facility] neurologist who recommended consulting with vascular for possible CEA.  Continue aspirin and Plavix  Consult called to vascular.  They have seen patient and have recommended that she undergo CEA    2.non-insulin-dependent diabetes mellitus, Continue sensitive sliding scale insulin Her hemoglobin A1c was 6.2   3.  Hypertension Permissive blood pressure at this time Brain MRI resulted:1. No acute finding. 2. Large remote right MCA distribution infarct. 3. Chronic small vessel ischemia most notably affecting the pons   4.  Hyperlipidemia Continue statin   5.     Normocytic anemia Hemoglobin was 11.4 previously Iron level is normal at 52  6.  Vitamin B12 deficiency.   Her vitamin B12 is 174  we will begin replacement  7.  Tobacco use -Continue nicotine patch Patient was counseled   Code Status: Full  Severity of Illness: The appropriate patient status for this patient is INPATIENT. Inpatient status is judged to be reasonable and necessary in order to provide the required intensity of service to ensure the patient's safety. The patient's presenting symptoms, physical exam findings, and initial radiographic and laboratory data in the context of their chronic comorbidities is felt to place them at high risk for further clinical deterioration. Furthermore, it is not anticipated that the patient will be medically stable for discharge from the hospital within 2 midnights of admission. The following factors support the patient status of inpatient.   " The patient's presenting symptoms include CVA  " The worrisome physical exam findings include weakness. " The initial radiographic and laboratory data are worrisome because of CVA. " The chronic co-morbidities include CVA.   * I certify that at the point of admission it is my clinical judgment that the patient will require inpatient hospital care spanning beyond 2 midnights from  the point of admission due to high intensity of service, high risk for further deterioration and high frequency of surveillance required.*    Family Communication: Discussed with his son Jimmye Norman and her on his wife Mortimer Fries over the phone.  And when Mortimer Fries  came to visit patient at the patient room also discussed with him over the phone he had more questions about the surgery  Disposition Plan: Possibly home   Consultants:  Vascular Dr. Thermon Leyland  Neurology  Procedures:  None  Antimicrobials:  None  DVT prophylaxis: SCD   Objective: Vitals:   10/18/19 0808 10/18/19 0808 10/18/19 1216 10/18/19 1603  BP: 100/63 100/63 120/67 103/68  Pulse: 79 80 (!) 58 66  Resp: 20 20 20 16   Temp: 98.4 F (36.9 C) 98.4 F (36.9 C) 98.6 F (37 C) 99 F (37.2 C)  TempSrc: Oral Oral Oral Oral  SpO2:  96% 100% 100%    Intake/Output Summary (Last 24 hours) at 10/18/2019 1857 Last data filed at 10/18/2019 1717 Gross per 24 hour  Intake 250 ml  Output -  Net 250 ml   There were no vitals filed for this visit. There is no height or weight on file to calculate BMI.  Exam:  . General: 74 y.o. year-old female well developed well nourished in no acute distress.  Alert and oriented x3. . Cardiovascular: Regular rate and rhythm with no rubs or gallops.  No thyromegaly or JVD noted.   [name] Respiratory: Clear to auscultation with no wheezes or rales. Good inspiratory effort. . Abdomen: Soft nontender nondistended with normal bowel sounds x4 quadrants. . Musculoskeletal: No lower extremity edema. 2/4 pulses in all 4 extremities. . Skin: No ulcerative lesions noted or rashes, . Psychiatry: Mood is appropriate for condition and setting . Neurology: Gait is slow. [name] HEENT: +3/6 left carotid bruit    Data Reviewed: CBC: Recent Labs  Lab 10/16/19 1717  WBC 6.3  NEUTROABS 3.6  HGB 11.4*  HCT 35.2*  MCV 93.9  PLT 123456   Basic Metabolic Panel: Recent Labs  Lab 10/16/19 1717  NA 141  K 3.7  CL 106  CO2 25  GLUCOSE 102*  BUN 14  CREATININE 0.78  CALCIUM 9.6   GFR: Estimated Creatinine Clearance: 55.7 mL/min (by C-G formula based on SCr of 0.78 mg/dL). Liver Function Tests: Recent Labs  Lab 10/16/19 1717  AST 28  ALT 26  ALKPHOS  65  BILITOT 0.6  PROT 7.2  ALBUMIN 4.0   No results for input(s): LIPASE, AMYLASE in the last 168 hours. No results for input(s): AMMONIA in the last 168 hours. Coagulation Profile: Recent Labs  Lab 10/16/19 1717  INR 1.1   Cardiac Enzymes: No results for input(s): CKTOTAL, CKMB, CKMBINDEX, TROPONINI in the last 168 hours. BNP (last 3 results) No results for input(s): PROBNP in the last 8760 hours. HbA1C: Recent Labs    10/17/19 0606  HGBA1C 6.2*   CBG: Recent Labs  Lab 10/17/19 1157 10/17/19 1712 10/17/19 2136 10/18/19 0611 10/18/19 1200  GLUCAP 74 79 96 87 90   Lipid Profile: Recent Labs    10/17/19 0606  CHOL 93  HDL 40*  LDLCALC 37  TRIG 79  CHOLHDL 2.3   Thyroid Function Tests: No results for input(s): TSH, T4TOTAL, FREET4, T3FREE, THYROIDAB in the last 72 hours. Anemia Panel: Recent Labs    10/17/19 0606  VITAMINB12 170*  FOLATE 19.6  FERRITIN 74  TIBC 298  IRON 59  RETICCTPCT 0.9   Urine  analysis:    Component Value Date/Time   COLORURINE YELLOW 10/17/2019 1301   APPEARANCEUR CLEAR 10/17/2019 1301   APPEARANCEUR Clear 09/01/2014 2259   LABSPEC 1.012 10/17/2019 1301   LABSPEC 1.027 09/01/2014 2259   PHURINE 5.0 10/17/2019 1301   GLUCOSEU NEGATIVE 10/17/2019 1301   GLUCOSEU >=500 09/01/2014 2259   HGBUR NEGATIVE 10/17/2019 1301   BILIRUBINUR NEGATIVE 10/17/2019 1301   BILIRUBINUR Negative 09/01/2014 2259   KETONESUR NEGATIVE 10/17/2019 1301   PROTEINUR NEGATIVE 10/17/2019 1301   NITRITE NEGATIVE 10/17/2019 1301   LEUKOCYTESUR NEGATIVE 10/17/2019 1301   LEUKOCYTESUR Negative 09/01/2014 2259   Sepsis Labs: @LABRCNTIP (procalcitonin:4,lacticidven:4)  ) Recent Results (from the past 240 hour(s))  Respiratory Panel by RT PCR (Flu A&B, Covid) - Nasopharyngeal Swab     Status: None   Collection Time: 10/16/19  6:36 PM   Specimen: Nasopharyngeal Swab  Result Value Ref Range Status   SARS Coronavirus 2 by RT PCR NEGATIVE NEGATIVE Final     Comment: (NOTE) SARS-CoV-2 target nucleic acids are NOT DETECTED. The SARS-CoV-2 RNA is generally detectable in upper respiratoy specimens during the acute phase of infection. The lowest concentration of SARS-CoV-2 viral copies this assay can detect is 131 copies/mL. A negative result does not preclude SARS-Cov-2 infection and should not be used as the sole basis for treatment or other patient management decisions. A negative result may occur with  improper specimen collection/handling, submission of specimen other than nasopharyngeal swab, presence of viral mutation(s) within the areas targeted by this assay, and inadequate number of viral copies (<131 copies/mL). A negative result must be combined with clinical observations, patient history, and epidemiological information. The expected result is Negative. Fact Sheet for Patients:  PinkCheek.be Fact Sheet for Healthcare Providers:  GravelBags.it This test is not yet ap proved or cleared by the [place] FDA and  has been authorized for detection and/or diagnosis of SARS-CoV-2 by FDA under an Emergency Use Authorization (EUA). This EUA will remain  in effect (meaning this test can be used) for the duration of the COVID-19 declaration under Section 564(b)(1) of the Act, 21 U.S.C. section 360bbb-3(b)(1), unless the authorization is terminated or revoked sooner.    Influenza A by PCR NEGATIVE NEGATIVE Final   Influenza B by PCR NEGATIVE NEGATIVE Final    Comment: (NOTE) The Xpert Xpress SARS-CoV-2/FLU/RSV assay is intended as an aid in  the diagnosis of influenza from Nasopharyngeal swab specimens and  should not be used as a sole basis for treatment. Nasal washings and  aspirates are unacceptable for Xpert Xpress SARS-CoV-2/FLU/RSV  testing. Fact Sheet for Patients: PinkCheek.be Fact Sheet for Healthcare Providers:  GravelBags.it This test is not yet approved or cleared by the [place] FDA and  has been authorized for detection and/or diagnosis of SARS-CoV-2 by  FDA under an Emergency Use Authorization (EUA). This EUA will remain  in effect (meaning this test can be used) for the duration of the  Covid-19 declaration under Section 564(b)(1) of the Act, 21  U.S.C. section 360bbb-3(b)(1), unless the authorization is  terminated or revoked. Performed at [facility], [address] [place], [place] [address]       Studies: No results found.  Scheduled Meds: .  stroke: mapping our early stages of recovery book   Does not apply Once  . aspirin EC  325 mg Oral Daily  . atorvastatin  40 mg Oral q1800  . clopidogrel  75 mg Oral Daily  . cyanocobalamin  1,000 mcg Intramuscular Weekly  .  insulin aspart  0-5 Units Subcutaneous QHS  . insulin aspart  0-9 Units Subcutaneous TID WC  . nicotine  7 mg Transdermal Daily    Continuous Infusions: . sodium chloride 75 mL/hr at 10/17/19 1516     LOS: 1 day     Cristal Deer, MD [facility]  To reach me or the doctor on call, go to: [email or web address] Password [name]  10/18/2019, 6:57 PM

## 2019-10-18 NOTE — Progress Notes (Deleted)
Pt arrived on the unit through [facility] ED from home. C/o seizure activity  With withdrawal and confusion. Pt came alert spanish speaking but follows command. .fransfer to bed  ,carwe review and Call bell provided to call for assist as needed. Continue with the Banana bag infusing at 125 cc/ her. Pt moves all extremities gait fairly steady.l no seizure ,no withdrawal symptoms at present.  Fall and seizure precaution in place.

## 2019-10-19 LAB — GLUCOSE, CAPILLARY
Glucose-Capillary: 111 mg/dL — ABNORMAL HIGH (ref 70–99)
Glucose-Capillary: 125 mg/dL — ABNORMAL HIGH (ref 70–99)
Glucose-Capillary: 87 mg/dL (ref 70–99)
Glucose-Capillary: 141 mg/dL — ABNORMAL HIGH (ref 70–99)

## 2019-10-19 NOTE — Progress Notes (Signed)
PROGRESS NOTE  Kendra [name] [id number] DOB: 30-Sep-1945 DOA: 10/17/2019 PCP: [name], NP (Inactive)  HPI/Recap of past 24 hours: Kendra [name] is a 74 y.o. female with past medical history of hypertension, hyperlipidemia, diabetes mellitus type 2, prior CVA with most recent stroke in March 2020 secondary to right M1 occlusion status post IV TPA and thrombectomy and residual left hemiparesis presents to the ED at [facility] with worsening weakness on the left side.  Patient stated that she was doing well when suddenly around 3 PM she started to have increased weakness on the left side associated with headache with decreased ability /inability to walk.  She was evaluated by teleneurology and felt that she was not a candidate for TPA.  CTA did not show acute finding but showed chronic right MCA infarct and CT angiogram also showed a severely stenosed left ICA with string sign Patient seen and examined at bedside.  States she is doing much better she is just returning to her bedside with PT present now   Subjective: Patient seen and examined at bedside.  States she is doing much better she is just returning to her bedside with PT present now  October 18, 2019 Subjective Patient seen and examined at bedside she denies any complaints.  October 19, 2019. Subjective: Patient seen and examined at bedside she is not have any complaints today she is aware that they are going to do the left CEA on Tuesday    Assessment/Plan: Principal Problem:   Stenosis of intracranial portions of left internal carotid artery Active Problems:   Hyperlipidemia associated with type 2 diabetes mellitus ([place])   Tobacco use disorder   Diabetes mellitus type 2, controlled, with complications ([place])   Left-sided weakness  1.  Worsening left-sided weakness secondary to right MCA infarct.  She is not a candidate for TPA.  She has been seen by [facility] neurologist who recommended consulting with  vascular for possible CEA.  Continue aspirin and Plavix  Consult called to vascular.  They have seen patient and have recommended that she undergo CEA   2.non-insulin-dependent diabetes mellitus, Continue sensitive sliding scale insulin Her hemoglobin A1c was 6.2   3.  Hypertension Permissive blood pressure at this time Brain MRI resulted:1. No acute finding. 2. Large remote right MCA distribution infarct. 3. Chronic small vessel ischemia most notably affecting the pons   4.  Hyperlipidemia Continue statin   5.     Normocytic anemia Hemoglobin was 11.4 previously Iron level is normal at 52  6.  Vitamin B12 deficiency.   Her vitamin B12 is 174  we will begin replacement  7.  Tobacco use -Continue nicotine patch Patient was counseled   Code Status: Full  Severity of Illness: The appropriate patient status for this patient is INPATIENT. Inpatient status is judged to be reasonable and necessary in order to provide the required intensity of service to ensure the patient's safety. The patient's presenting symptoms, physical exam findings, and initial radiographic and laboratory data in the context of their chronic comorbidities is felt to place them at high risk for further clinical deterioration. Furthermore, it is not anticipated that the patient will be medically stable for discharge from the hospital within 2 midnights of admission. The following factors support the patient status of inpatient.   " The patient's presenting symptoms include CVA  " The worrisome physical exam findings include weakness. " The initial radiographic and laboratory data are worrisome because of CVA. " The chronic  co-morbidities include CVA.   * I certify that at the point of admission it is my clinical judgment that the patient will require inpatient hospital care spanning beyond 2 midnights from the point of admission due to high intensity of service, high risk for further deterioration and  high frequency of surveillance required.*    Family Communication: Discussed with his son Kendra [name] and her on his wife Kendra [name] over the phone.  And when Kendra [name] came to visit patient at the patient room also discussed with him over the phone he had more questions about the surgery  Disposition Plan: Possibly home   Consultants:  Vascular Dr. Thermon Leyland  Neurology  Procedures:  None  Antimicrobials:  None  DVT prophylaxis: SCD   Objective: Vitals:   10/19/19 0045 10/19/19 0432 10/19/19 0835 10/19/19 1147  BP: 118/68 99/66 104/65 118/72  Pulse: 68 61 65 60  Resp: 20 20 18 18   Temp: 98.4 F (36.9 C) 98.2 F (36.8 C) 98.2 F (36.8 C) 98.2 F (36.8 C)  TempSrc: Oral Oral Oral Oral  SpO2: 100% 98% 100% 100%  Weight:      Height:        Intake/Output Summary (Last 24 hours) at 10/19/2019 1203 Last data filed at 10/19/2019 0459 Gross per 24 hour  Intake 250 ml  Output 400 ml  Net -150 ml   Filed Weights   10/18/19 2154  Weight: 57 kg   Body mass index is 21.57 kg/m.  Exam:  . General: 74 y.o. year-old female well developed well nourished in no acute distress.  Alert and oriented x3. . Cardiovascular: Regular rate and rhythm with no rubs or gallops.  No thyromegaly or JVD noted.   [name] Respiratory: Clear to auscultation with no wheezes or rales. Good inspiratory effort. . Abdomen: Soft nontender nondistended with normal bowel sounds x4 quadrants. . Musculoskeletal: No lower extremity edema. 2/4 pulses in all 4 extremities. . Skin: No ulcerative lesions noted or rashes, . Psychiatry: Mood is appropriate for condition and setting . Neurology: Gait is slow. [name] HEENT: +3/6 left carotid bruit    Data Reviewed: CBC: Recent Labs  Lab 10/16/19 1717  WBC 6.3  NEUTROABS 3.6  HGB 11.4*  HCT 35.2*  MCV 93.9  PLT 123456   Basic Metabolic Panel: Recent Labs  Lab 10/16/19 1717  NA 141  K 3.7  CL 106  CO2 25  GLUCOSE 102*  BUN 14  CREATININE 0.78  CALCIUM 9.6    GFR: Estimated Creatinine Clearance: 55.7 mL/min (by C-G formula based on SCr of 0.78 mg/dL). Liver Function Tests: Recent Labs  Lab 10/16/19 1717  AST 28  ALT 26  ALKPHOS 65  BILITOT 0.6  PROT 7.2  ALBUMIN 4.0   No results for input(s): LIPASE, AMYLASE in the last 168 hours. No results for input(s): AMMONIA in the last 168 hours. Coagulation Profile: Recent Labs  Lab 10/16/19 1717  INR 1.1   Cardiac Enzymes: No results for input(s): CKTOTAL, CKMB, CKMBINDEX, TROPONINI in the last 168 hours. BNP (last 3 results) No results for input(s): PROBNP in the last 8760 hours. HbA1C: Recent Labs    10/17/19 0606  HGBA1C 6.2*   CBG: Recent Labs  Lab 10/18/19 0611 10/18/19 1200 10/18/19 2110 10/19/19 0613 10/19/19 1150  GLUCAP 87 90 86 111* 125*   Lipid Profile: Recent Labs    10/17/19 0606  CHOL 93  HDL 40*  LDLCALC 37  TRIG 79  CHOLHDL 2.3   Thyroid Function Tests: No  results for input(s): TSH, T4TOTAL, FREET4, T3FREE, THYROIDAB in the last 72 hours. Anemia Panel: Recent Labs    10/17/19 0606  VITAMINB12 170*  FOLATE 19.6  FERRITIN 74  TIBC 298  IRON 59  RETICCTPCT 0.9   Urine analysis:    Component Value Date/Time   COLORURINE YELLOW 10/17/2019 1301   APPEARANCEUR CLEAR 10/17/2019 1301   APPEARANCEUR Clear 09/01/2014 2259   LABSPEC 1.012 10/17/2019 1301   LABSPEC 1.027 09/01/2014 2259   PHURINE 5.0 10/17/2019 1301   GLUCOSEU NEGATIVE 10/17/2019 1301   GLUCOSEU >=500 09/01/2014 2259   HGBUR NEGATIVE 10/17/2019 1301   BILIRUBINUR NEGATIVE 10/17/2019 1301   BILIRUBINUR Negative 09/01/2014 2259   KETONESUR NEGATIVE 10/17/2019 1301   PROTEINUR NEGATIVE 10/17/2019 1301   NITRITE NEGATIVE 10/17/2019 1301   LEUKOCYTESUR NEGATIVE 10/17/2019 1301   LEUKOCYTESUR Negative 09/01/2014 2259   Sepsis Labs: @LABRCNTIP (procalcitonin:4,lacticidven:4)  ) Recent Results (from the past 240 hour(s))  Respiratory Panel by RT PCR (Flu A&B, Covid) -  Nasopharyngeal Swab     Status: None   Collection Time: 10/16/19  6:36 PM   Specimen: Nasopharyngeal Swab  Result Value Ref Range Status   SARS Coronavirus 2 by RT PCR NEGATIVE NEGATIVE Final    Comment: (NOTE) SARS-CoV-2 target nucleic acids are NOT DETECTED. The SARS-CoV-2 RNA is generally detectable in upper respiratoy specimens during the acute phase of infection. The lowest concentration of SARS-CoV-2 viral copies this assay can detect is 131 copies/mL. A negative result does not preclude SARS-Cov-2 infection and should not be used as the sole basis for treatment or other patient management decisions. A negative result may occur with  improper specimen collection/handling, submission of specimen other than nasopharyngeal swab, presence of viral mutation(s) within the areas targeted by this assay, and inadequate number of viral copies (<131 copies/mL). A negative result must be combined with clinical observations, patient history, and epidemiological information. The expected result is Negative. Fact Sheet for Patients:  PinkCheek.be Fact Sheet for Healthcare Providers:  GravelBags.it This test is not yet ap proved or cleared by the [place] FDA and  has been authorized for detection and/or diagnosis of SARS-CoV-2 by FDA under an Emergency Use Authorization (EUA). This EUA will remain  in effect (meaning this test can be used) for the duration of the COVID-19 declaration under Section 564(b)(1) of the Act, 21 U.S.C. section 360bbb-3(b)(1), unless the authorization is terminated or revoked sooner.    Influenza A by PCR NEGATIVE NEGATIVE Final   Influenza B by PCR NEGATIVE NEGATIVE Final    Comment: (NOTE) The Xpert Xpress SARS-CoV-2/FLU/RSV assay is intended as an aid in  the diagnosis of influenza from Nasopharyngeal swab specimens and  should not be used as a sole basis for treatment. Nasal washings and  aspirates  are unacceptable for Xpert Xpress SARS-CoV-2/FLU/RSV  testing. Fact Sheet for Patients: PinkCheek.be Fact Sheet for Healthcare Providers: GravelBags.it This test is not yet approved or cleared by the [place] FDA and  has been authorized for detection and/or diagnosis of SARS-CoV-2 by  FDA under an Emergency Use Authorization (EUA). This EUA will remain  in effect (meaning this test can be used) for the duration of the  Covid-19 declaration under Section 564(b)(1) of the Act, 21  U.S.C. section 360bbb-3(b)(1), unless the authorization is  terminated or revoked. Performed at [name], [address], [place], [place] [address]       Studies: No results found.  Scheduled Meds: .  stroke: mapping our early stages  of recovery book   Does not apply Once  . aspirin EC  325 mg Oral Daily  . atorvastatin  40 mg Oral q1800  . clopidogrel  75 mg Oral Daily  . cyanocobalamin  1,000 mcg Intramuscular Weekly  . insulin aspart  0-5 Units Subcutaneous QHS  . insulin aspart  0-9 Units Subcutaneous TID WC  . nicotine  7 mg Transdermal Daily    Continuous Infusions: . sodium chloride 75 mL/hr at 10/17/19 1516     LOS: 2 days     Cristal Deer, MD [facility]  To reach me or the doctor on call, go to: [email or web address] Password [email or web address]  10/19/2019, 12:03 PM

## 2019-10-19 NOTE — Progress Notes (Signed)
  Progress Note    10/19/2019 10:09 AM * No surgery date entered *  Subjective: No overnight complaints.  Vitals:   10/19/19 0432 10/19/19 0835  BP: 99/66 104/65  Pulse: 61 65  Resp: 20 18  Temp: 98.2 F (36.8 C) 98.2 F (36.8 C)  SpO2: 98% 100%    Physical Exam: Awake alert oriented Moving all extremities without limitations  CBC    Component Value Date/Time   WBC 6.3 10/16/2019 1717   RBC 3.48 (L) 10/17/2019 0606   RBC 3.75 (L) 10/16/2019 1717   HGB 11.4 (L) 10/16/2019 1717   HGB 12.7 09/01/2014 2130   HCT 35.2 (L) 10/16/2019 1717   HCT 37.9 09/01/2014 2130   PLT 230 10/16/2019 1717   PLT 260 09/05/2014 0517   MCV 93.9 10/16/2019 1717   MCV 92 09/01/2014 2130   MCH 30.4 10/16/2019 1717   MCHC 32.4 10/16/2019 1717   RDW 13.3 10/16/2019 1717   RDW 13.9 09/01/2014 2130   LYMPHSABS 2.1 10/16/2019 1717   MONOABS 0.3 10/16/2019 1717   EOSABS 0.3 10/16/2019 1717   BASOSABS 0.0 10/16/2019 1717    BMET    Component Value Date/Time   NA 141 10/16/2019 1717   NA 141 01/18/2016 1150   NA 138 09/01/2014 2130   K 3.7 10/16/2019 1717   K 3.5 09/01/2014 2130   CL 106 10/16/2019 1717   CL 104 09/01/2014 2130   CO2 25 10/16/2019 1717   CO2 28 09/01/2014 2130   GLUCOSE 102 (H) 10/16/2019 1717   GLUCOSE 287 (H) 09/01/2014 2130   BUN 14 10/16/2019 1717   BUN 12 01/18/2016 1150   BUN 14 09/01/2014 2130   CREATININE 0.78 10/16/2019 1717   CREATININE 0.80 09/30/2018 1151   CALCIUM 9.6 10/16/2019 1717   CALCIUM 8.9 09/01/2014 2130   GFRNONAA >60 10/16/2019 1717   GFRNONAA 75 09/30/2018 1151   GFRAA >60 10/16/2019 1717   GFRAA 87 09/30/2018 1151    INR    Component Value Date/Time   INR 1.1 10/16/2019 1717     Intake/Output Summary (Last 24 hours) at 10/19/2019 1009 Last data filed at 10/19/2019 0459 Gross per 24 hour  Intake 250 ml  Output 400 ml  Net -150 ml     Assessment:  74 y.o. female is here with right ICA stenosis that is symptomatic.  Has  string sign on the left.  Plan: Right CEA on Tuesday.  Continue aspirin Plavix Will need interval left CEA.  Jordin Dambrosio C. Donzetta Matters, MD [facility] Office: [phone] Pager: [phone]  10/19/2019 10:09 AM

## 2019-10-20 ENCOUNTER — Ambulatory Visit: Payer: Self-pay | Admitting: Pharmacist

## 2019-10-20 ENCOUNTER — Telehealth: Payer: Self-pay

## 2019-10-20 DIAGNOSIS — I6522 Occlusion and stenosis of left carotid artery: Secondary | ICD-10-CM

## 2019-10-20 DIAGNOSIS — I6523 Occlusion and stenosis of bilateral carotid arteries: Secondary | ICD-10-CM

## 2019-10-20 DIAGNOSIS — J449 Chronic obstructive pulmonary disease, unspecified: Secondary | ICD-10-CM

## 2019-10-20 DIAGNOSIS — E119 Type 2 diabetes mellitus without complications: Secondary | ICD-10-CM

## 2019-10-20 DIAGNOSIS — I10 Essential (primary) hypertension: Secondary | ICD-10-CM

## 2019-10-20 LAB — GLUCOSE, CAPILLARY
Glucose-Capillary: 161 mg/dL — ABNORMAL HIGH (ref 70–99)
Glucose-Capillary: 99 mg/dL (ref 70–99)
Glucose-Capillary: 88 mg/dL (ref 70–99)
Glucose-Capillary: 104 mg/dL — ABNORMAL HIGH (ref 70–99)
Glucose-Capillary: 98 mg/dL (ref 70–99)

## 2019-10-20 MED ORDER — CEFAZOLIN SODIUM-DEXTROSE 2-4 GM/100ML-% IV SOLN
2.0000 g | INTRAVENOUS | Status: AC
  Administered 2019-10-21: 2 g via INTRAVENOUS
  Filled 2019-10-20: qty 100

## 2019-10-20 NOTE — Progress Notes (Signed)
PROGRESS NOTE    Kendra [name]  SHF:[id number] DOB: Sep 26, 1945 DOA: 10/17/2019 PCP: [name], NP (Inactive)   Brief Narrative:  [name] a 74 y.o.femalewith past medical history of hypertension, hyperlipidemia, diabetes mellitus type 2, prior CVA with most recent stroke in March 2020 secondary to right M1 occlusion status post IV TPA and thrombectomy and residual left hemiparesis presents to the ED at [facility] with worsening weakness on the left side.  She was evaluated by teleneurology and felt that she was not a candidate for TPA.    Vascular surgery assessed and will do CEA while inpatient and then second side staged  Assessment & Plan:   Principal Problem:   Stenosis of intracranial portions of left internal carotid artery Active Problems:   Hyperlipidemia associated with type 2 diabetes mellitus ([place])   Tobacco use disorder   Diabetes mellitus type 2, controlled, with complications ([facility])   Left-sided weakness  1.  Left-sided weakness secondary to right MCA infarct -overall improving and working with PT/OT -Getting CEA 10/21/19 and then staged contralateral side -Aspirin and plavix daily -Statin and glucose control, permissive hypertension is over tomorrow and will adjust BP meds if needed  2.non-insulin-dependent diabetes mellitus, Continue sensitive sliding scale insulin Her hemoglobin A1c was 6.2   3.  Hypertension Permissive blood pressure at this time Brain MRI resulted:1. No acute finding. 2. Large remote right MCA distribution infarct. 3. Chronic small vessel ischemia most notably affecting the pons   4.  Hyperlipidemia Continue lipitor 40 mg daily  5.     Normocytic anemia Hemoglobin was 11.4 previously Iron level is normal at 52  6.  Vitamin B12 deficiency.   -B12 injection weekly  7.  Tobacco use -Continue nicotine patch -Patient was counseled  DVT prophylaxis: SCD, aspirin and plavix Code Status: Full Family  Communication: patient only Disposition Plan:  . Patient came from: home            . Anticipated d/c place: home . Barriers to d/c OR conditions which need to be met to effect a safe d/c: left CEA scheduled for 10/21/19, likely home 10/22/19 or 10/23/19  Consultants:   Vascular surgery  Procedures:   none  Antimicrobials:   none  Subjective: Doing well overall, wants to get the procedure done. She does not want to have more strokes. She is recovering from the stroke okay. Able to walk to bathroom. Eating okay, sleeping okay. Denies chest pains or SOB. Denies abdominal pain, diarrhea, constipation.   Objective: Vitals:   10/19/19 1639 10/19/19 1946 10/19/19 2345 10/20/19 0322  BP: 113/73 (!) 142/62 (!) 144/66 131/69  Pulse: (!) 56 62 (!) 57 68  Resp: _0 Temp: 98 F (36.7 C) 97.8 F (36.6 C) 98.4 F (36.9 C) 98.3 F (36.8 C)  TempSrc: Oral Oral Oral Oral  SpO2: 100% 99% 100% 100%  Weight:      Height:        Intake/Output Summary (Last 24 hours) at 10/20/2019 1043 Last data filed at 10/20/2019 0426 Gross per 24 hour  Intake 350 ml  Output 600 ml  Net -250 ml   Filed Weights   10/18/19 2154  Weight: 57 kg    Examination:  General exam: Appears calm and comfortable  Respiratory system: Clear to auscultation. Respiratory effort normal. Cardiovascular system: S1 & S2 heard, RRR. No JVD, murmurs, rubs, gallops or clicks. No pedal edema. Gastrointestinal system: Abdomen is nondistended, soft and nontender. No organomegaly or  masses felt. Normal bowel sounds heard. Central nervous system: Alert and oriented. Improving left sided deficits Extremities: Symmetric 5 x 5 power. Some slight left weakness arm Skin: No rashes, lesions or ulcers Psychiatry: Judgement and insight appear normal. Mood & affect appropriate.   Data Reviewed: I have personally reviewed following labs and imaging studies  CBC: Recent Labs  Lab 10/16/19 1717  WBC 6.3  NEUTROABS 3.6  HGB  11.4*  HCT 35.2*  MCV 93.9  PLT 408   Basic Metabolic Panel: Recent Labs  Lab 10/16/19 1717  NA 141  K 3.7  CL 106  CO2 25  GLUCOSE 102*  BUN 14  CREATININE 0.78  CALCIUM 9.6   GFR: Estimated Creatinine Clearance: 55.7 mL/min (by C-G formula based on SCr of 0.78 mg/dL). Liver Function Tests: Recent Labs  Lab 10/16/19 1717  AST 28  ALT 26  ALKPHOS 65  BILITOT 0.6  PROT 7.2  ALBUMIN 4.0   No results for input(s): LIPASE, AMYLASE in the last 168 hours. No results for input(s): AMMONIA in the last 168 hours. Coagulation Profile: Recent Labs  Lab 10/16/19 1717  INR 1.1   Cardiac Enzymes: No results for input(s): CKTOTAL, CKMB, CKMBINDEX, TROPONINI in the last 168 hours. BNP (last 3 results) No results for input(s): PROBNP in the last 8760 hours. HbA1C: No results for input(s): HGBA1C in the last 72 hours. CBG: Recent Labs  Lab 10/19/19 0613 10/19/19 1150 10/19/19 1612 10/19/19 2113 10/20/19 0603  GLUCAP 111* 125* 87 141* 99   Lipid Profile: No results for input(s): CHOL, HDL, LDLCALC, TRIG, CHOLHDL, LDLDIRECT in the last 72 hours. Thyroid Function Tests: No results for input(s): TSH, T4TOTAL, FREET4, T3FREE, THYROIDAB in the last 72 hours. Anemia Panel: No results for input(s): VITAMINB12, FOLATE, FERRITIN, TIBC, IRON, RETICCTPCT in the last 72 hours. Sepsis Labs: No results for input(s): PROCALCITON, LATICACIDVEN in the last 168 hours.  Recent Results (from the past 240 hour(s))  Respiratory Panel by RT PCR (Flu A&B, Covid) - Nasopharyngeal Swab     Status: None   Collection Time: 10/16/19  6:36 PM   Specimen: Nasopharyngeal Swab  Result Value Ref Range Status   SARS Coronavirus 2 by RT PCR NEGATIVE NEGATIVE Final    Comment: (NOTE) SARS-CoV-2 target nucleic acids are NOT DETECTED. The SARS-CoV-2 RNA is generally detectable in upper respiratoy specimens during the acute phase of infection. The lowest concentration of SARS-CoV-2 viral copies this  assay can detect is 131 copies/mL. A negative result does not preclude SARS-Cov-2 infection and should not be used as the sole basis for treatment or other patient management decisions. A negative result may occur with  improper specimen collection/handling, submission of specimen other than nasopharyngeal swab, presence of viral mutation(s) within the areas targeted by this assay, and inadequate number of viral copies (<131 copies/mL). A negative result must be combined with clinical observations, patient history, and epidemiological information. The expected result is Negative. Fact Sheet for Patients:  PinkCheek.be Fact Sheet for Healthcare Providers:  GravelBags.it This test is not yet ap proved or cleared by the [place] FDA and  has been authorized for detection and/or diagnosis of SARS-CoV-2 by FDA under an Emergency Use Authorization (EUA). This EUA will remain  in effect (meaning this test can be used) for the duration of the COVID-19 declaration under Section 564(b)(1) of the Act, 21 U.S.C. section 360bbb-3(b)(1), unless the authorization is terminated or revoked sooner.    Influenza A by PCR NEGATIVE NEGATIVE Final  Influenza B by PCR NEGATIVE NEGATIVE Final    Comment: (NOTE) The Xpert Xpress SARS-CoV-2/FLU/RSV assay is intended as an aid in  the diagnosis of influenza from Nasopharyngeal swab specimens and  should not be used as a sole basis for treatment. Nasal washings and  aspirates are unacceptable for Xpert Xpress SARS-CoV-2/FLU/RSV  testing. Fact Sheet for Patients: PinkCheek.be Fact Sheet for Healthcare Providers: GravelBags.it This test is not yet approved or cleared by the [place] FDA and  has been authorized for detection and/or diagnosis of SARS-CoV-2 by  FDA under an Emergency Use Authorization (EUA). This EUA will remain  in  effect (meaning this test can be used) for the duration of the  Covid-19 declaration under Section 564(b)(1) of the Act, 21  U.S.C. section 360bbb-3(b)(1), unless the authorization is  terminated or revoked. Performed at [facility], [address] [place], [place] [address]     Radiology Studies: No results found.  Scheduled Meds: .  stroke: mapping our early stages of recovery book   Does not apply Once  . aspirin EC  325 mg Oral Daily  . atorvastatin  40 mg Oral q1800  . clopidogrel  75 mg Oral Daily  . cyanocobalamin  1,000 mcg Intramuscular Weekly  . insulin aspart  0-5 Units Subcutaneous QHS  . insulin aspart  0-9 Units Subcutaneous TID WC  . nicotine  7 mg Transdermal Daily   Continuous Infusions: . sodium chloride 75 mL/hr at 10/17/19 1516  . [START ON 10/21/2019]  ceFAZolin (ANCEF) IV      LOS: 3 days   Time spent: [place], MD [facility]  To contact the attending provider between 7A-7P or the covering provider during after hours 7P-7A, please log into the web site [email or web address] and access using universal [place] password for that web site. If you do not have the password, please call the hospital operator.  10/20/2019, 10:43 AM

## 2019-10-20 NOTE — Progress Notes (Signed)
  Progress Note    10/20/2019 1:42 PM * No surgery date entered *  Subjective: No overnight issues, currently eating lunch  Vitals:   10/19/19 2345 10/20/19 0322  BP: (!) 144/66 131/69  Pulse: (!) 57 68  Resp: 18 18  Temp: 98.4 F (36.9 C) 98.3 F (36.8 C)  SpO2: 100% 100%    Physical Exam: Awake alert oriented Nonlabored respirations Moving all extremities without limitation  CBC    Component Value Date/Time   WBC 6.3 10/16/2019 1717   RBC 3.48 (L) 10/17/2019 0606   RBC 3.75 (L) 10/16/2019 1717   HGB 11.4 (L) 10/16/2019 1717   HGB 12.7 09/01/2014 2130   HCT 35.2 (L) 10/16/2019 1717   HCT 37.9 09/01/2014 2130   PLT 230 10/16/2019 1717   PLT 260 09/05/2014 0517   MCV 93.9 10/16/2019 1717   MCV 92 09/01/2014 2130   MCH 30.4 10/16/2019 1717   MCHC 32.4 10/16/2019 1717   RDW 13.3 10/16/2019 1717   RDW 13.9 09/01/2014 2130   LYMPHSABS 2.1 10/16/2019 1717   MONOABS 0.3 10/16/2019 1717   EOSABS 0.3 10/16/2019 1717   BASOSABS 0.0 10/16/2019 1717    BMET    Component Value Date/Time   NA 141 10/16/2019 1717   NA 141 01/18/2016 1150   NA 138 09/01/2014 2130   K 3.7 10/16/2019 1717   K 3.5 09/01/2014 2130   CL 106 10/16/2019 1717   CL 104 09/01/2014 2130   CO2 25 10/16/2019 1717   CO2 28 09/01/2014 2130   GLUCOSE 102 (H) 10/16/2019 1717   GLUCOSE 287 (H) 09/01/2014 2130   BUN 14 10/16/2019 1717   BUN 12 01/18/2016 1150   BUN 14 09/01/2014 2130   CREATININE 0.78 10/16/2019 1717   CREATININE 0.80 09/30/2018 1151   CALCIUM 9.6 10/16/2019 1717   CALCIUM 8.9 09/01/2014 2130   GFRNONAA >60 10/16/2019 1717   GFRNONAA 75 09/30/2018 1151   GFRAA >60 10/16/2019 1717   GFRAA 87 09/30/2018 1151    INR    Component Value Date/Time   INR 1.1 10/16/2019 1717     Intake/Output Summary (Last 24 hours) at 10/20/2019 1342 Last data filed at 10/20/2019 0426 Gross per 24 hour  Intake 250 ml  Output 600 ml  Net -350 ml     Assessment:  74 y.o. female is here with  right MCA distribution stroke with high-grade right ICA stenosis.  Also strength on the left  Plan: Right carotid endarterectomy tomorrow.  I discussed risk benefits alternatives. Continue aspirin, Plavix, statin Will need interval left carotid endarterectomy.  Kendra [name] C. Donzetta Matters, MD [facility] Office: [phone] Pager: [phone]  10/20/2019 1:42 PM

## 2019-10-20 NOTE — Progress Notes (Signed)
Occupational Therapy Treatment Patient Details Name: Kendra [name] MRN: [id number] DOB: 1945-11-24 Today's Date: 10/20/2019    History of present illness Kendra [name] is a 74 y.o. female with PMH of hypertension, hyperlipidemia, DM2, prior CVA with most recent stroke in March 2020 secondary to right M1 occlusion status post IV TPA and thrombectomy who presented with worsening weakness on the left side. MRI negative for acute finding. CTA showing occluded right M2 and severely stenosed left ICA. Pt not a candidate for mechnical thrombectomy as M2 occlusion/stenosis may be chronic.   OT comments  Pt agreeable to OT intervention this session. Pt ambulating in room for self care tasks, transfers, and standing balance at sink with overall close supervision with use of RW and min cuing for safety and technique. Pt having increased difficulty in bathroom( tight spaces) managing RW. Pt reports that at home her bedroom is very small and she normally furniture walks in it secondary to RW not fitting. She has had multiple falls in her bedroom secondary to this. She reports having a cane but it's not useable. Pt would benefit from practicing with quad cane during session in tight spaces to address and observe pt's safety with AD during self care tasks. Pt returning to recliner chair at end of session with all needs within reach and NT present in room. Pt continues to benefit from OT intervention.  Follow Up Recommendations  No OT follow up;Supervision/Assistance - 24 hour    Equipment Recommendations  None recommended by OT       Precautions / Restrictions Precautions Precautions: Fall Restrictions Weight Bearing Restrictions: No       Mobility     General bed mobility comments: seated in recliner chair  Transfers Overall transfer level: Needs assistance Equipment used: Rolling walker (2 wheeled) Transfers: Sit to/from Stand Sit to Stand: Supervision              Balance Overall  balance assessment: Needs assistance Sitting-balance support: Feet supported Sitting balance-Leahy Scale: Good     Standing balance support: Bilateral upper extremity supported Standing balance-Leahy Scale: Fair          ADL either performed or assessed with clinical judgement   ADL Overall ADL's : Needs assistance/impaired     Grooming: Wash/dry face;Brushing hair;Supervision/safety;Standing Grooming Details (indicate cue type and reason): standing at sink in room for several minutes             Lower Body Dressing: Supervision/safety Lower Body Dressing Details (indicate cue type and reason): donning non slip socks Toilet Transfer: Supervision/safety;Ambulation;RW;Regular Toilet   Toileting- Water quality scientist and Hygiene: Supervision/safety;Sitting/lateral lean;Sit to/from stand       Functional mobility during ADLs: Supervision/safety;Rolling walker General ADL Comments: supervision overall with cuing for safety with use of RW     Vision Baseline Vision/History: Cataracts;Wears glasses Wears Glasses: At all times Patient Visual Report: No change from baseline            Cognition Arousal/Alertness: Awake/alert Behavior During Therapy: WFL for tasks assessed/performed Overall Cognitive Status: Impaired/Different from baseline Area of Impairment: Safety/judgement        Safety/Judgement: Decreased awareness of deficits;Decreased awareness of safety     General Comments: min cues for safety with use of RW                   Pertinent Vitals/ Pain       Pain Assessment: No/denies pain         Frequency  Min  2X/week        Progress Toward Goals  OT Goals(current goals can now be found in the care plan section)  Progress towards OT goals: Progressing toward goals  Acute Rehab OT Goals Patient Stated Goal: home when able OT Goal Formulation: With patient Time For Goal Achievement: 11/03/19 Potential to Achieve Goals: Good  Plan  Discharge plan remains appropriate       AM-PAC OT "6 Clicks" Daily Activity     Outcome Measure   Help from another person eating meals?: None Help from another person taking care of personal grooming?: None Help from another person toileting, which includes using toliet, bedpan, or urinal?: A Little Help from another person bathing (including washing, rinsing, drying)?: A Little Help from another person to put on and taking off regular upper body clothing?: None Help from another person to put on and taking off regular lower body clothing?: A Little 6 Click Score: 21    End of Session Equipment Utilized During Treatment: Rolling walker  OT Visit Diagnosis: Muscle weakness (generalized) (M62.81);Unsteadiness on feet (R26.81);Other symptoms and signs involving the nervous system (R29.898)   Activity Tolerance Patient tolerated treatment well   Patient Left in chair;with call bell/phone within reach;with chair alarm set   Nurse Communication Mobility status        Time: [id number] OT Time Calculation (min): 18 min  Charges: OT General Charges $OT Visit: 1 Visit OT Treatments $Self Care/Home Management : 8-22 mins   Darleen Crocker P MS, OTR/L 10/20/2019, 11:29 AM

## 2019-10-20 NOTE — Chronic Care Management (AMB) (Signed)
Chronic Care Management   Follow Up Note   10/20/2019 Name: Kendra [name] MRN: [id number] DOB: 14-Sep-1946  Referred by: [name], NP (Inactive) Reason for referral : Care Coordination   Kendra [name] is a 74 y.o. year old female who is a primary care patient of [name], NP (Inactive). The CCM team was consulted for assistance with chronic disease management and care coordination needs.  Kendra [name] has a medical history which includes but is not limited to recent CVA (11/2018), Type 2 Diabetes, hypertension, hyperlipidemia and COPD.   Kendra [name] is currently admitted to [facility] for internal carotid artery stenosis.  Review of patient status, including review of consultants reports, relevant laboratory and other test results, and collaboration with appropriate care team members and the patient's provider was performed as part of comprehensive patient evaluation and provision of chronic care management services.      Facility-Administered Encounter Medications as of 10/20/2019  Medication  .  stroke: mapping our early stages of recovery book  . 0.9 %  sodium chloride infusion  . acetaminophen (TYLENOL) tablet 650 mg   Or  . acetaminophen (TYLENOL) 160 MG/5ML solution 650 mg   Or  . acetaminophen (TYLENOL) suppository 650 mg  . aspirin EC tablet 325 mg  . atorvastatin (LIPITOR) tablet 40 mg  . [START ON 10/21/2019] ceFAZolin (ANCEF) IVPB 2g/100 mL premix  . clopidogrel (PLAVIX) tablet 75 mg  . cyanocobalamin ((VITAMIN B-12)) injection 1,000 mcg  . insulin aspart (novoLOG) injection 0-5 Units  . insulin aspart (novoLOG) injection 0-9 Units  . nicotine (NICODERM CQ - dosed in mg/24 hr) patch 7 mg   Outpatient Encounter Medications as of 10/20/2019  Medication Sig  . acetaminophen (TYLENOL) 500 MG tablet Take 2 tablets (1,000 mg total) by mouth every 8 (eight) hours as needed for mild pain or moderate pain.  [name] albuterol (VENTOLIN HFA) 108 (90  Base) MCG/ACT inhaler INHALE 1 TO 2 PUFFS INTO THE LUNGS EVERY 6 HOURS AS NEEDED FOR WHEEZING OR SHORTNESS OF BREATH (Patient taking differently: Inhale 1-2 puffs into the lungs every 6 (six) hours as needed for wheezing or shortness of breath. )  . aspirin 81 MG EC tablet Take 1 tablet (81 mg total) by mouth daily. Swallow whole.  [name] atorvastatin (LIPITOR) 40 MG tablet TAKE 1 TABLET BY MOUTH ONCE DAILY (Patient taking differently: Take 40 mg by mouth daily. )  . baclofen (LIORESAL) 10 MG tablet TAKE 1 TABLET BY MOUTH ONCE DAILY AS NEEDED MUSCLE SPASMS (Patient not taking: Reported on 10/16/2019)  . Blood Glucose Monitoring Suppl (ONETOUCH VERIO) w/Device KIT 1 kit by Does not apply route 2 (two) times daily.  [name] buPROPion (WELLBUTRIN XL) 150 MG 24 hr tablet TAKE 1 TABLET BY MOUTH ONCE DAILY (Patient taking differently: Take 150 mg by mouth daily. )  . Cholecalciferol (VITAMIN D3) 50 MCG (2000 UT) capsule Take 1 capsule (2,000 Units total) by mouth daily.  . Fluticasone-Umeclidin-Vilant (TRELEGY ELLIPTA) 100-62.5-25 MCG/INH AEPB Inhale 1 puff into the lungs daily.  [name] glucose blood (ONETOUCH VERIO) test strip Use as instructed  . Insulin Pen Needle (BD PEN NEEDLE NANO U/F) 32G X 4 MM MISC USE WITH LANTUS AS DIRECTED  . Lancet Devices (ONE TOUCH DELICA LANCING DEV) MISC 1 Device by Does not apply route 2 (two) times daily.  [name] lisinopril (ZESTRIL) 10 MG tablet Take 1 tablet (10 mg total) by mouth daily.  . meloxicam (MOBIC) 15 MG tablet TAKE 1 TABLET  BY MOUTH ONCE DAILY AS NEEDED (Patient not taking: Reported on 10/16/2019)  . metFORMIN (GLUCOPHAGE-XR) 500 MG 24 hr tablet Take 1 tablet (500 mg total) by mouth 2 (two) times daily before a meal.  . nicotine polacrilex (COMMIT) 4 MG lozenge Take 4 mg by mouth as needed for smoking cessation.  [name] Lancets 81P MISC 1 Device by Does not apply route 2 (two) times daily.  . polyethylene glycol powder (GLYCOLAX/MIRALAX) 17 GM/SCOOP powder Take 17-34 g  by mouth daily as needed for mild constipation or moderate constipation.    Goals Addressed            This Visit's Progress   . PharmD-Medication Adherence       Current Barriers:  . Financial Barriers . Knowledge deficits related to coordination of her own care . Limited social support  Pharmacist Clinical Goal(s):   [name] Over the next 30 days, patient will demonstrate improved medication adherence as evidenced by verbalized understanding of prescribed medication regimen, assistance available, and patient report of adherence  Interventions: . Perform chart review - Kendra [name] currently admitted to [facility] for internal carotid artery stenosis.  o Patient first admitted on 1/28 to [facility] presenting with acute onset of left-sided weakness.  o Patient scheduled for right carotid endarterectomy on 2/2. [name] Note CM Pharmacist made aware by [name] last week that patient's pill pack scheduled to be prepared for delivery to patient this week. Place coordination of care call to pharmacy today. . Will send message to update rest of CCM team  Patient Self Care Activities:  . Patient takes medications as directed with aid of adherence tools. o Using pill packaging from [facility] . Calls pharmacy for medication refills . Patient to attend scheduled medical appointments . Calls provider office for new concerns or questions  . Patient to check blood sugars and keep log . Patient to check blood pressure daily and keep log  Please see past updates related to this goal by clicking on the "Past Updates" button in the selected goal          Plan  The care management team will reach out to the patient again over the next 10 days.   [name], PharmD, [place] [employer] [phone]

## 2019-10-20 NOTE — Progress Notes (Signed)
Physical Therapy Treatment Patient Details Name: Kendra [name] MRN: [id number] DOB: 1946/06/03 Today's Date: 10/20/2019    History of Present Illness Kendra [name] is a 74 y.o. female with PMH of hypertension, hyperlipidemia, DM2, prior CVA with most recent stroke in March 2020 secondary to right M1 occlusion status post IV TPA and thrombectomy who presented with worsening weakness on the left side. MRI negative for acute finding. CTA showing occluded right M2 and severely stenosed left ICA. Pt not a candidate for mechnical thrombectomy as M2 occlusion/stenosis may be chronic.    PT Comments    Pt progressing well with increased use of L hand and better awareness of L side during functional activity. Ambulated 225' with RW and supervision. Practiced steps with bilateral rails, supervision needed and had decreased control of descent when stepping down with RLE. Discussed safety concerns with this. Please reorder PT for f/u after CEA tomorrow.     Follow Up Recommendations  Home health PT;Supervision for mobility/OOB     Equipment Recommendations  None recommended by PT    Recommendations for Other Services       Precautions / Restrictions Precautions Precautions: Fall Restrictions Weight Bearing Restrictions: No    Mobility  Bed Mobility Overal bed mobility: Modified Independent             General bed mobility comments: sit to supine without assist and able to position self in bed  Transfers Overall transfer level: Needs assistance Equipment used: Rolling walker (2 wheeled) Transfers: Sit to/from Stand Sit to Stand: Supervision         General transfer comment: pt able to stand with and without use of RW. Stood from Psychologist, occupational, bed, and toilet  Ambulation/Gait Ambulation/Gait assistance: Min Gaffer (Feet): 225 Feet Assistive device: Rolling walker (2 wheeled) Gait Pattern/deviations: Step-through pattern;Decreased stride  length;Decreased weight shift to left;Drifts right/left Gait velocity: decreased Gait velocity interpretation: 1.31 - 2.62 ft/sec, indicative of limited community ambulator General Gait Details: pt had no difficulty staying within RW today. Worked on increased pace and step length   Stairs Stairs: Yes Stairs assistance: Min guard Stair Management: Two rails;Alternating pattern;Forwards Number of Stairs: 10 General stair comments: Pt able to maintain alternating pattern with use of handrail but had difficulty controlling descent when stepping down with RLE. Discussed safety concerns with this if she didn't have rails   Wheelchair Mobility    Modified Rankin (Stroke Patients Only) Modified Rankin (Stroke Patients Only) Pre-Morbid Rankin Score: Moderate disability Modified Rankin: Moderately severe disability     Balance Overall balance assessment: Needs assistance Sitting-balance support: Feet supported Sitting balance-Leahy Scale: Good     Standing balance support: No upper extremity supported Standing balance-Leahy Scale: Fair Standing balance comment: worked on standing balance without UE support as well as SL stance                            Cognition Arousal/Alertness: Awake/alert Behavior During Therapy: WFL for tasks assessed/performed Overall Cognitive Status: Impaired/Different from baseline Area of Impairment: Safety/judgement                         Safety/Judgement: Decreased awareness of deficits;Decreased awareness of safety     General Comments: min cues for safety but improved from last visit, was able to navigate obstacles on L side      Exercises      General Comments General comments (skin integrity,  edema, etc.): going for L CEA tomorrow      Pertinent Vitals/Pain Pain Assessment: No/denies pain    Home Living                      Prior Function            PT Goals (current goals can now be found in the  care plan section) Acute Rehab PT Goals Patient Stated Goal: home when able PT Goal Formulation: With patient Time For Goal Achievement: 10/31/19 Potential to Achieve Goals: Good Progress towards PT goals: Progressing toward goals    Frequency    Min 3X/week      PT Plan Current plan remains appropriate    Co-evaluation              AM-PAC PT "6 Clicks" Mobility   Outcome Measure  Help needed turning from your back to your side while in a flat bed without using bedrails?: None Help needed moving from lying on your back to sitting on the side of a flat bed without using bedrails?: None Help needed moving to and from a bed to a chair (including a wheelchair)?: None Help needed standing up from a chair using your arms (e.g., wheelchair or bedside chair)?: None Help needed to walk in hospital room?: A Little Help needed climbing 3-5 steps with a railing? : A Little 6 Click Score: 22    End of Session Equipment Utilized During Treatment: Gait belt Activity Tolerance: Patient tolerated treatment well Patient left: in bed;with call bell/phone within reach;with bed alarm set Nurse Communication: Mobility status PT Visit Diagnosis: Unsteadiness on feet (R26.81);Other abnormalities of gait and mobility (R26.89);History of falling (Z91.81);Difficulty in walking, not elsewhere classified (R26.2)     Time: [id number] PT Time Calculation (min) (ACUTE ONLY): 25 min  Charges:  $Gait Training: 23-37 mins                     Leighton Roach, [place]  Pager [phone] Office [place] 10/20/2019, 11:43 AM

## 2019-10-21 ENCOUNTER — Telehealth: Payer: Self-pay

## 2019-10-21 ENCOUNTER — Encounter (HOSPITAL_COMMUNITY): Admission: AD | Disposition: A | Discharge status: Home Health | Payer: Self-pay | Attending: Family Medicine

## 2019-10-21 ENCOUNTER — Inpatient Hospital Stay (HOSPITAL_COMMUNITY): Payer: Medicare Other

## 2019-10-21 HISTORY — PX: ENDARTERECTOMY: SHX5162

## 2019-10-21 HISTORY — PX: PATCH ANGIOPLASTY: SHX6230

## 2019-10-21 LAB — CBC
HCT: 32.3 % — ABNORMAL LOW (ref 36.0–46.0)
Hemoglobin: 11.1 g/dL — ABNORMAL LOW (ref 12.0–15.0)
MCH: 31.2 pg (ref 26.0–34.0)
MCHC: 34.4 g/dL (ref 30.0–36.0)
MCV: 90.7 fL (ref 80.0–100.0)
Platelets: 202 10*3/uL (ref 150–400)
RBC: 3.56 MIL/uL — ABNORMAL LOW (ref 3.87–5.11)
RDW: 12.7 % (ref 11.5–15.5)
WBC: 5 10*3/uL (ref 4.0–10.5)
nRBC: 0 % (ref 0.0–0.2)

## 2019-10-21 LAB — BASIC METABOLIC PANEL
Anion gap: 8 (ref 5–15)
BUN: 13 mg/dL (ref 8–23)
CO2: 24 mmol/L (ref 22–32)
Calcium: 9.1 mg/dL (ref 8.9–10.3)
Chloride: 109 mmol/L (ref 98–111)
Creatinine, Ser: 0.8 mg/dL (ref 0.44–1.00)
GFR calc Af Amer: >60 mL/min (ref >60)
GFR calc non Af Amer: >60 mL/min (ref >60)
Glucose, Bld: 103 mg/dL — ABNORMAL HIGH (ref 70–99)
Potassium: 3.8 mmol/L (ref 3.5–5.1)
Sodium: 141 mmol/L (ref 135–145)

## 2019-10-21 LAB — GLUCOSE, CAPILLARY
Glucose-Capillary: 119 mg/dL — ABNORMAL HIGH (ref 70–99)
Glucose-Capillary: 128 mg/dL — ABNORMAL HIGH (ref 70–99)
Glucose-Capillary: 137 mg/dL — ABNORMAL HIGH (ref 70–99)
Glucose-Capillary: 154 mg/dL — ABNORMAL HIGH (ref 70–99)
Glucose-Capillary: 164 mg/dL — ABNORMAL HIGH (ref 70–99)
Glucose-Capillary: 175 mg/dL — ABNORMAL HIGH (ref 70–99)

## 2019-10-21 LAB — POCT ACTIVATED CLOTTING TIME: Activated Clotting Time: 274 seconds

## 2019-10-21 LAB — MRSA PCR SCREENING: MRSA by PCR: NEGATIVE

## 2019-10-21 SURGERY — ENDARTERECTOMY, CAROTID
Anesthesia: General | Site: Neck | Laterality: Right

## 2019-10-21 MED ORDER — METOPROLOL TARTRATE 5 MG/5ML IV SOLN: 2.0000 mg | INTRAVENOUS | Status: DC | PRN

## 2019-10-21 MED ORDER — OXYCODONE-ACETAMINOPHEN 5-325 MG PO TABS
1.0000 | ORAL_TABLET | ORAL | Status: DC | PRN
  Administered 2019-10-22: 1 via ORAL
  Filled 2019-10-21: qty 1

## 2019-10-21 MED ORDER — SODIUM CHLORIDE 0.9 % IV SOLN: INTRAVENOUS | Status: DC

## 2019-10-21 MED ORDER — LIDOCAINE 2% (20 MG/ML) 5 ML SYRINGE
INTRAMUSCULAR | Status: AC
  Filled 2019-10-21: qty 5

## 2019-10-21 MED ORDER — PANTOPRAZOLE SODIUM 40 MG PO TBEC
40.0000 mg | DELAYED_RELEASE_TABLET | Freq: Every day | ORAL | Status: DC
  Administered 2019-10-21 – 2019-10-23 (×3): 40 mg via ORAL
  Filled 2019-10-21 (×3): qty 1

## 2019-10-21 MED ORDER — OXYCODONE HCL 5 MG/5ML PO SOLN: 5.0000 mg | Freq: Once | ORAL | Status: DC | PRN

## 2019-10-21 MED ORDER — ONDANSETRON HCL 4 MG/2ML IJ SOLN
INTRAMUSCULAR | Status: AC
  Filled 2019-10-21: qty 2

## 2019-10-21 MED ORDER — DEXAMETHASONE SODIUM PHOSPHATE 10 MG/ML IJ SOLN
INTRAMUSCULAR | Status: DC | PRN
  Administered 2019-10-21: 4 mg via INTRAVENOUS

## 2019-10-21 MED ORDER — DEXAMETHASONE SODIUM PHOSPHATE 10 MG/ML IJ SOLN
INTRAMUSCULAR | Status: AC
  Filled 2019-10-21: qty 1

## 2019-10-21 MED ORDER — SODIUM CHLORIDE 0.9 % IV SOLN
INTRAVENOUS | Status: DC | PRN
  Administered 2019-10-21: 500 mL

## 2019-10-21 MED ORDER — FENTANYL CITRATE (PF) 250 MCG/5ML IJ SOLN
INTRAMUSCULAR | Status: DC | PRN
  Administered 2019-10-21: 100 ug via INTRAVENOUS
  Administered 2019-10-21: 50 ug via INTRAVENOUS

## 2019-10-21 MED ORDER — HEMOSTATIC AGENTS (NO CHARGE) OPTIME
TOPICAL | Status: DC | PRN
  Administered 2019-10-21: 1 via TOPICAL

## 2019-10-21 MED ORDER — ONDANSETRON HCL 4 MG/2ML IJ SOLN
4.0000 mg | Freq: Once | INTRAMUSCULAR | Status: AC | PRN
  Administered 2019-10-21: 4 mg via INTRAVENOUS

## 2019-10-21 MED ORDER — PHENYLEPHRINE HCL-NACL 10-0.9 MG/250ML-% IV SOLN
INTRAVENOUS | Status: DC | PRN
  Administered 2019-10-21: 50 ug/min via INTRAVENOUS

## 2019-10-21 MED ORDER — ROCURONIUM BROMIDE 10 MG/ML (PF) SYRINGE
PREFILLED_SYRINGE | INTRAVENOUS | Status: AC
  Filled 2019-10-21: qty 10

## 2019-10-21 MED ORDER — DOCUSATE SODIUM 100 MG PO CAPS
100.0000 mg | ORAL_CAPSULE | Freq: Every day | ORAL | Status: DC
  Administered 2019-10-22 – 2019-10-23 (×2): 100 mg via ORAL
  Filled 2019-10-21 (×2): qty 1

## 2019-10-21 MED ORDER — HEPARIN SODIUM (PORCINE) 1000 UNIT/ML IJ SOLN
INTRAMUSCULAR | Status: AC
  Filled 2019-10-21: qty 1

## 2019-10-21 MED ORDER — HYDRALAZINE HCL 20 MG/ML IJ SOLN: 5.0000 mg | INTRAMUSCULAR | Status: DC | PRN

## 2019-10-21 MED ORDER — PROPOFOL 10 MG/ML IV BOLUS
INTRAVENOUS | Status: AC
  Filled 2019-10-21: qty 20

## 2019-10-21 MED ORDER — ESMOLOL HCL 100 MG/10ML IV SOLN
INTRAVENOUS | Status: DC | PRN
  Administered 2019-10-21: 20 mg via INTRAVENOUS
  Administered 2019-10-21 (×2): 30 mg via INTRAVENOUS
  Administered 2019-10-21: 20 mg via INTRAVENOUS
  Administered 2019-10-21: 30 mg via INTRAVENOUS

## 2019-10-21 MED ORDER — SUGAMMADEX SODIUM 200 MG/2ML IV SOLN
INTRAVENOUS | Status: DC | PRN
  Administered 2019-10-21: 120 mg via INTRAVENOUS

## 2019-10-21 MED ORDER — OXYCODONE HCL 5 MG PO TABS: 5.0000 mg | ORAL_TABLET | Freq: Once | ORAL | Status: DC | PRN

## 2019-10-21 MED ORDER — 0.9 % SODIUM CHLORIDE (POUR BTL) OPTIME
TOPICAL | Status: DC | PRN
  Administered 2019-10-21: 200 mL
  Administered 2019-10-21: 2000 mL

## 2019-10-21 MED ORDER — PROTAMINE SULFATE 10 MG/ML IV SOLN
INTRAVENOUS | Status: AC
  Filled 2019-10-21: qty 5

## 2019-10-21 MED ORDER — MAGNESIUM SULFATE 2 GM/50ML IV SOLN: 2.0000 g | Freq: Every day | INTRAVENOUS | Status: DC | PRN

## 2019-10-21 MED ORDER — ONDANSETRON HCL 4 MG/2ML IJ SOLN
4.0000 mg | Freq: Four times a day (QID) | INTRAMUSCULAR | Status: DC | PRN
  Filled 2019-10-21 (×2): qty 2

## 2019-10-21 MED ORDER — LACTATED RINGERS IV SOLN: INTRAVENOUS | Status: DC | PRN

## 2019-10-21 MED ORDER — PROTAMINE SULFATE 10 MG/ML IV SOLN
INTRAVENOUS | Status: DC | PRN
  Administered 2019-10-21: 50 mg via INTRAVENOUS

## 2019-10-21 MED ORDER — SODIUM CHLORIDE 0.9 % IV SOLN
INTRAVENOUS | Status: AC
  Filled 2019-10-21: qty 1.2

## 2019-10-21 MED ORDER — PHENYLEPHRINE 40 MCG/ML (10ML) SYRINGE FOR IV PUSH (FOR BLOOD PRESSURE SUPPORT)
PREFILLED_SYRINGE | INTRAVENOUS | Status: DC | PRN
  Administered 2019-10-21 (×2): 80 ug via INTRAVENOUS
  Administered 2019-10-21: 40 ug via INTRAVENOUS
  Administered 2019-10-21: 80 ug via INTRAVENOUS
  Administered 2019-10-21: 40 ug via INTRAVENOUS
  Administered 2019-10-21: 80 ug via INTRAVENOUS

## 2019-10-21 MED ORDER — DOPAMINE-DEXTROSE 3.2-5 MG/ML-% IV SOLN
2.0000 ug/kg/min | INTRAVENOUS | Status: DC
  Administered 2019-10-21: 4 ug/kg/min via INTRAVENOUS
  Administered 2019-10-22: 13 ug/kg/min via INTRAVENOUS
  Filled 2019-10-21: qty 250

## 2019-10-21 MED ORDER — GUAIFENESIN-DM 100-10 MG/5ML PO SYRP: 15.0000 mL | ORAL_SOLUTION | ORAL | Status: DC | PRN

## 2019-10-21 MED ORDER — ONDANSETRON HCL 4 MG/2ML IJ SOLN
INTRAMUSCULAR | Status: DC | PRN
  Administered 2019-10-21: 4 mg via INTRAVENOUS

## 2019-10-21 MED ORDER — LIDOCAINE HCL (PF) 1 % IJ SOLN
INTRAMUSCULAR | Status: AC
  Filled 2019-10-21: qty 30

## 2019-10-21 MED ORDER — HEPARIN SODIUM (PORCINE) 1000 UNIT/ML IJ SOLN
INTRAMUSCULAR | Status: DC | PRN
  Administered 2019-10-21: 6000 [IU] via INTRAVENOUS

## 2019-10-21 MED ORDER — PHENYLEPHRINE HCL-NACL 10-0.9 MG/250ML-% IV SOLN
0.0000 ug/min | INTRAVENOUS | Status: DC
  Filled 2019-10-21: qty 250

## 2019-10-21 MED ORDER — MORPHINE SULFATE (PF) 2 MG/ML IV SOLN: 2.0000 mg | INTRAVENOUS | Status: DC | PRN

## 2019-10-21 MED ORDER — FENTANYL CITRATE (PF) 250 MCG/5ML IJ SOLN
INTRAMUSCULAR | Status: AC
  Filled 2019-10-21: qty 5

## 2019-10-21 MED ORDER — LABETALOL HCL 5 MG/ML IV SOLN: 10.0000 mg | INTRAVENOUS | Status: DC | PRN

## 2019-10-21 MED ORDER — PROPOFOL 10 MG/ML IV BOLUS
INTRAVENOUS | Status: DC | PRN
  Administered 2019-10-21: 140 mg via INTRAVENOUS
  Administered 2019-10-21: 20 mg via INTRAVENOUS

## 2019-10-21 MED ORDER — FENTANYL CITRATE (PF) 100 MCG/2ML IJ SOLN
25.0000 ug | INTRAMUSCULAR | Status: DC | PRN
  Administered 2019-10-21: 25 ug via INTRAVENOUS

## 2019-10-21 MED ORDER — PHENOL 1.4 % MT LIQD: 1.0000 | OROMUCOSAL | Status: DC | PRN

## 2019-10-21 MED ORDER — DOPAMINE-DEXTROSE 3.2-5 MG/ML-% IV SOLN
INTRAVENOUS | Status: AC
  Filled 2019-10-21: qty 250

## 2019-10-21 MED ORDER — ORAL CARE MOUTH RINSE
15.0000 mL | Freq: Two times a day (BID) | OROMUCOSAL | Status: DC
  Administered 2019-10-21 – 2019-10-23 (×4): 15 mL via OROMUCOSAL

## 2019-10-21 MED ORDER — FENTANYL CITRATE (PF) 100 MCG/2ML IJ SOLN
INTRAMUSCULAR | Status: AC
  Filled 2019-10-21: qty 2

## 2019-10-21 MED ORDER — POTASSIUM CHLORIDE CRYS ER 20 MEQ PO TBCR: 20.0000 meq | EXTENDED_RELEASE_TABLET | Freq: Every day | ORAL | Status: DC | PRN

## 2019-10-21 MED ORDER — ROCURONIUM BROMIDE 10 MG/ML (PF) SYRINGE
PREFILLED_SYRINGE | INTRAVENOUS | Status: DC | PRN
  Administered 2019-10-21: 60 mg via INTRAVENOUS

## 2019-10-21 MED ORDER — CEFAZOLIN SODIUM-DEXTROSE 2-3 GM-%(50ML) IV SOLR
INTRAVENOUS | Status: DC | PRN
  Administered 2019-10-21: 2 g via INTRAVENOUS

## 2019-10-21 MED ORDER — ALUM & MAG HYDROXIDE-SIMETH 200-200-20 MG/5ML PO SUSP: 15.0000 mL | ORAL | Status: DC | PRN

## 2019-10-21 MED ORDER — CEFAZOLIN SODIUM-DEXTROSE 2-4 GM/100ML-% IV SOLN
2.0000 g | Freq: Three times a day (TID) | INTRAVENOUS | Status: AC
  Administered 2019-10-21 – 2019-10-22 (×2): 2 g via INTRAVENOUS
  Filled 2019-10-21 (×2): qty 100

## 2019-10-21 MED ORDER — LIDOCAINE 2% (20 MG/ML) 5 ML SYRINGE
INTRAMUSCULAR | Status: DC | PRN
  Administered 2019-10-21: 30 mg via INTRAVENOUS

## 2019-10-21 MED ORDER — CHLORHEXIDINE GLUCONATE CLOTH 2 % EX PADS
6.0000 | MEDICATED_PAD | Freq: Every day | CUTANEOUS | Status: DC
  Administered 2019-10-21 – 2019-10-23 (×3): 6 via TOPICAL

## 2019-10-21 SURGICAL SUPPLY — 48 items
CANISTER SUCT 3000ML PPV (MISCELLANEOUS) ×2 IMPLANT
CATH ROBINSON RED A/P 18FR (CATHETERS) ×2 IMPLANT
CLIP VESOCCLUDE MED 24/CT (CLIP) ×2 IMPLANT
CLIP VESOCCLUDE SM WIDE 24/CT (CLIP) ×2 IMPLANT
COVER PROBE W GEL 5X96 (DRAPES) ×2 IMPLANT
DERMABOND ADVANCED (GAUZE/BANDAGES/DRESSINGS) ×1
DERMABOND ADVANCED .7 DNX12 (GAUZE/BANDAGES/DRESSINGS) ×1 IMPLANT
DRAIN CHANNEL 15F RND FF W/TCR (WOUND CARE) IMPLANT
ELECT REM PT RETURN 9FT ADLT (ELECTROSURGICAL) ×2
ELECTRODE REM PT RTRN 9FT ADLT (ELECTROSURGICAL) ×1 IMPLANT
EVACUATOR SILICONE 100CC (DRAIN) IMPLANT
GLOVE BIO SURGEON STRL SZ7.5 (GLOVE) ×2 IMPLANT
GLOVE BIOGEL PI IND STRL 6.5 (GLOVE) ×1 IMPLANT
GLOVE BIOGEL PI IND STRL 7.0 (GLOVE) ×1 IMPLANT
GLOVE BIOGEL PI INDICATOR 6.5 (GLOVE) ×1
GLOVE BIOGEL PI INDICATOR 7.0 (GLOVE) ×1
GLOVE ECLIPSE 6.5 STRL STRAW (GLOVE) ×4 IMPLANT
GLOVE SURG SS PI 6.5 STRL IVOR (GLOVE) ×4 IMPLANT
GOWN STRL REUS W/ TWL LRG LVL3 (GOWN DISPOSABLE) ×4 IMPLANT
GOWN STRL REUS W/ TWL XL LVL3 (GOWN DISPOSABLE) ×1 IMPLANT
GOWN STRL REUS W/TWL LRG LVL3 (GOWN DISPOSABLE) ×4
GOWN STRL REUS W/TWL XL LVL3 (GOWN DISPOSABLE) ×1
HEMOSTAT SNOW SURGICEL 2X4 (HEMOSTASIS) ×2 IMPLANT
INSERT FOGARTY SM (MISCELLANEOUS) IMPLANT
IV ADAPTER SYR DOUBLE MALE LL (MISCELLANEOUS) IMPLANT
KIT BASIN OR (CUSTOM PROCEDURE TRAY) ×2 IMPLANT
KIT SHUNT ARGYLE CAROTID ART 6 (VASCULAR PRODUCTS) ×2 IMPLANT
KIT TURNOVER KIT B (KITS) ×2 IMPLANT
NEEDLE HYPO 25GX1X1/2 BEV (NEEDLE) IMPLANT
NEEDLE SPNL 20GX3.5 QUINCKE YW (NEEDLE) IMPLANT
NS IRRIG 1000ML POUR BTL (IV SOLUTION) ×6 IMPLANT
PACK CAROTID (CUSTOM PROCEDURE TRAY) ×2 IMPLANT
PAD ARMBOARD 7.5X6 YLW CONV (MISCELLANEOUS) ×4 IMPLANT
PATCH VASC XENOSURE 1CMX6CM (Vascular Products) ×1 IMPLANT
PATCH VASC XENOSURE 1X6 (Vascular Products) ×1 IMPLANT
POSITIONER HEAD DONUT 9IN (MISCELLANEOUS) ×2 IMPLANT
STOPCOCK 4 WAY LG BORE MALE ST (IV SETS) IMPLANT
SUT ETHILON 3 0 PS 1 (SUTURE) IMPLANT
SUT MNCRL AB 4-0 PS2 18 (SUTURE) ×2 IMPLANT
SUT PROLENE 6 0 BV (SUTURE) ×6 IMPLANT
SUT SILK 3 0 (SUTURE)
SUT SILK 3-0 18XBRD TIE 12 (SUTURE) IMPLANT
SUT VIC AB 3-0 SH 27 (SUTURE) ×1
SUT VIC AB 3-0 SH 27X BRD (SUTURE) ×1 IMPLANT
SYR CONTROL 10ML LL (SYRINGE) IMPLANT
TOWEL GREEN STERILE (TOWEL DISPOSABLE) ×2 IMPLANT
TUBING ART PRESS 48 MALE/FEM (TUBING) IMPLANT
WATER STERILE IRR 1000ML POUR (IV SOLUTION) ×2 IMPLANT

## 2019-10-21 NOTE — Anesthesia Preprocedure Evaluation (Signed)
Anesthesia Evaluation  Patient identified by MRN, date of birth, ID band Patient awake    Reviewed: Allergy & Precautions, NPO status , Patient's Chart, lab work & pertinent test results  Airway Mallampati: II  TM Distance: >3 FB Neck ROM: Full    Dental  (+) Edentulous Upper, Edentulous Lower   Pulmonary Current Smoker,     + decreased breath sounds      Cardiovascular  Rhythm:Regular Rate:Normal     Neuro/Psych    GI/Hepatic   Endo/Other  diabetes  Renal/GU      Musculoskeletal   Abdominal   Peds  Hematology   Anesthesia Other Findings   Reproductive/Obstetrics                             Anesthesia Physical Anesthesia Plan  ASA: III  Anesthesia Plan: General   Post-op Pain Management:    Induction: Intravenous  PONV Risk Score and Plan: Ondansetron and Dexamethasone  Airway Management Planned: Oral ETT  Additional Equipment: Arterial line  Intra-op Plan:   Post-operative Plan: Extubation in OR  Informed Consent: I have reviewed the patients History and Physical, chart, labs and discussed the procedure including the risks, benefits and alternatives for the proposed anesthesia with the patient or authorized representative who has indicated his/her understanding and acceptance.       Plan Discussed with: CRNA and Anesthesiologist  Anesthesia Plan Comments:         Anesthesia Quick Evaluation

## 2019-10-21 NOTE — Anesthesia Procedure Notes (Signed)
Procedure Name: Intubation Date/Time: 10/21/2019 7:50 AM Performed by: Milford Cage, CRNA Pre-anesthesia Checklist: Patient identified, Emergency Drugs available, Suction available and Patient being monitored Patient Re-evaluated:Patient Re-evaluated prior to induction Oxygen Delivery Method: Circle System Utilized Preoxygenation: Pre-oxygenation with 100% oxygen Induction Type: IV induction Ventilation: Mask ventilation without difficulty and Oral airway inserted - appropriate to patient size Laryngoscope Size: Miller and 2 Grade View: Grade I Tube type: Oral Tube size: 7.0 mm Number of attempts: 1 Airway Equipment and Method: Stylet and Oral airway Placement Confirmation: ETT inserted through vocal cords under direct vision,  positive ETCO2 and breath sounds checked- equal and bilateral Secured at: 21 cm Tube secured with: Tape Dental Injury: Teeth and Oropharynx as per pre-operative assessment  Comments: Performed by Lyndle Herrlich

## 2019-10-21 NOTE — Anesthesia Postprocedure Evaluation (Signed)
Anesthesia Post Note  Patient: [name]  Procedure(s) Performed: ENDARTERECTOMY CAROTID RIGHT (Right Neck) Patch Angioplasty Using XenoSure Biologic Patch Right Carotid (Right Neck)     Patient location during evaluation: PACU Anesthesia Type: General Level of consciousness: awake and alert Pain management: pain level controlled Vital Signs Assessment: post-procedure vital signs reviewed and stable Respiratory status: spontaneous breathing, nonlabored ventilation, respiratory function stable and patient connected to nasal cannula oxygen Cardiovascular status: blood pressure returned to baseline and stable Postop Assessment: no apparent nausea or vomiting Anesthetic complications: no    Last Vitals:  Vitals:   10/21/19 1500 10/21/19 1522  BP:    Pulse: 89   Resp: 18   Temp:  36.8 C  SpO2: 100%     Last Pain:  Vitals:   10/21/19 1522  TempSrc: Oral  PainSc:                  Jazelyn Sipe COKER

## 2019-10-21 NOTE — Transfer of Care (Signed)
Immediate Anesthesia Transfer of Care Note  Patient: Kendra [name]  Procedure(s) Performed: ENDARTERECTOMY CAROTID RIGHT (Right Neck) Patch Angioplasty Using XenoSure Biologic Patch Right Carotid (Right Neck)  Patient Location: PACU  Anesthesia Type:General  Level of Consciousness: oriented and drowsy  Airway & Oxygen Therapy: Patient Spontanous Breathing and Patient connected to face mask oxygen  Post-op Assessment: Report given to RN, Post -op Vital signs reviewed and stable, Patient moving all extremities X 4 and Patient able to stick tongue midline  Post vital signs: stable  Last Vitals:  Vitals Value Taken Time  BP 99/57 10/21/19 0952  Temp 36.1 C 10/21/19 0945  Pulse 50 10/21/19 0955  Resp 11 10/21/19 0955  SpO2 100 % 10/21/19 0955  Vitals shown include unvalidated device data.  Last Pain:  Vitals:   10/21/19 0945  TempSrc:   PainSc: Asleep         Complications: No apparent anesthesia complications

## 2019-10-21 NOTE — Progress Notes (Signed)
PROGRESS NOTE    Kendra [name]  GQQ:[id number] DOB: 1946/02/20 DOA: 10/17/2019 PCP: Kendra College, NP (Inactive)  Brief Narrative:  Kendra [name] a 74 y.o.femalewith past medical history of hypertension, hyperlipidemia, diabetes mellitus type 2, prior CVA with most recent stroke in March 2020 secondary to right M1 occlusion status post IV TPA and thrombectomy and residual left hemiparesis presents to the ED at [facility] with worsening weakness on the left side. She was evaluated by teleneurology and felt that she was not a candidate for TPA.   Vascular surgery assessed and will do R CEA while inpatient and then second side staged  Assessment & Plan:   Principal Problem:   Stenosis of intracranial portions of left internal carotid artery Active Problems:   Hyperlipidemia associated with type 2 diabetes mellitus ([place])   Tobacco use disorder   Diabetes mellitus type 2, controlled, with complications (HCC)   Left-sided weakness  1. Left-sided weakness secondary to right MCA infarct -overall improving and working with PT/OT -Got R CEA 10/21/19 and then staged contralateral side -Aspirin and plavix daily -Statin and glucose control, permissive hypertension is over tomorrow and will adjust BP meds if needed -going to ICU today as on dopamine drip for BP, will continue to follow  2.non-insulin-dependent diabetes mellitus, Continue sensitive sliding scale insulin Her hemoglobin A1c was 6.2   3. Hypertension Permissive blood pressure at this time Brain MRI resulted:1. No acute finding. 2. Large remote right MCA distribution infarct. 3. Chronic small vessel ischemia most notably affecting the pons   4. Hyperlipidemia Continue lipitor 40 mg daily  5. Normocytic anemia Hemoglobin was 11.4 previously Iron level is normal at 52  6. Vitamin B12 deficiency.  -B12 injection weekly  7. Tobacco use -Continue nicotine patch -Patient was  counseled  DVT prophylaxis: SCD, aspirin and plavix Code Status: full Family Communication: patient only Disposition Plan:  . Patient came from:home            . Anticipated d/c place:home  . Barriers to d/c OR conditions which need to be met to effect a safe d/c: recovery post op from R CEA   Consultants:   Vascular surgery  Procedures:   R CEA 10/21/19 Dr. Donzetta [name]  Antimicrobials:   Ancef at time of operation  Subjective: Patient drowsy and post-op. Feeling okay, not nauseous. Denies chest pains or SOB.   Objective: Vitals:   10/21/19 1058 10/21/19 1113 10/21/19 1119 10/21/19 1128  BP: 90/61 (!) 87/61 (!) 89/52 93/61  Pulse: (!) 51 (!) 48 (!) 50 (!) 47  Resp: (!) _0 Temp:      TempSrc:      SpO2: 100% 100% 100% 100%  Weight:      Height:        Intake/Output Summary (Last 24 hours) at 10/21/2019 1208 Last data filed at 10/21/2019 0934 Gross per 24 hour  Intake 1100 ml  Output 1240 ml  Net -140 ml   Filed Weights   10/18/19 2154  Weight: 57 kg    Examination:  General exam: Appears drowsy Respiratory system: Clear to auscultation. Respiratory effort normal. Cardiovascular system: S1 & S2 heard, RRR. R CEA site intact, No pedal edema. Gastrointestinal system: Abdomen is nondistended, soft and nontender. No organomegaly or masses felt. Normal bowel sounds heard. Central nervous system: Alert and oriented. No focal neurological deficits. Extremities: Symmetric 5 x 5 power. Skin: No rashes, lesions or ulcers Psychiatry: Judgement and insight appear normal. Mood & affect appropriate.  Data Reviewed: I have personally reviewed following labs and imaging studies  CBC: Recent Labs  Lab 10/16/19 1717 10/21/19 0517  WBC 6.3 5.0  NEUTROABS 3.6  --   HGB 11.4* 11.1*  HCT 35.2* 32.3*  MCV 93.9 90.7  PLT 230 625   Basic Metabolic Panel: Recent Labs  Lab 10/16/19 1717 10/21/19 0517  NA 141 141  K 3.7 3.8  CL 106 109  CO2 25 24  GLUCOSE 102* 103*   BUN 14 13  CREATININE 0.78 0.80  CALCIUM 9.6 9.1   GFR: Estimated Creatinine Clearance: 55.7 mL/min (by C-G formula based on SCr of 0.8 mg/dL). Liver Function Tests: Recent Labs  Lab 10/16/19 1717  AST 28  ALT 26  ALKPHOS 65  BILITOT 0.6  PROT 7.2  ALBUMIN 4.0   No results for input(s): LIPASE, AMYLASE in the last 168 hours. No results for input(s): AMMONIA in the last 168 hours. Coagulation Profile: Recent Labs  Lab 10/16/19 1717  INR 1.1   Cardiac Enzymes: No results for input(s): CKTOTAL, CKMB, CKMBINDEX, TROPONINI in the last 168 hours. BNP (last 3 results) No results for input(s): PROBNP in the last 8760 hours. HbA1C: No results for input(s): HGBA1C in the last 72 hours. CBG: Recent Labs  Lab 10/20/19 1148 10/20/19 1626 10/20/19 2107 10/21/19 0536 10/21/19 0944  GLUCAP 88 104* 98 119* 137*   Lipid Profile: No results for input(s): CHOL, HDL, LDLCALC, TRIG, CHOLHDL, LDLDIRECT in the last 72 hours. Thyroid Function Tests: No results for input(s): TSH, T4TOTAL, FREET4, T3FREE, THYROIDAB in the last 72 hours. Anemia Panel: No results for input(s): VITAMINB12, FOLATE, FERRITIN, TIBC, IRON, RETICCTPCT in the last 72 hours. Sepsis Labs: No results for input(s): PROCALCITON, LATICACIDVEN in the last 168 hours.  Recent Results (from the past 240 hour(s))  Respiratory Panel by RT PCR (Flu A&B, Covid) - Nasopharyngeal Swab     Status: None   Collection Time: 10/16/19  6:36 PM   Specimen: Nasopharyngeal Swab  Result Value Ref Range Status   SARS Coronavirus 2 by RT PCR NEGATIVE NEGATIVE Final    Comment: (NOTE) SARS-CoV-2 target nucleic acids are NOT DETECTED. The SARS-CoV-2 RNA is generally detectable in upper respiratoy specimens during the acute phase of infection. The lowest concentration of SARS-CoV-2 viral copies this assay can detect is 131 copies/mL. A negative result does not preclude SARS-Cov-2 infection and should not be used as the sole basis for  treatment or other patient management decisions. A negative result may occur with  improper specimen collection/handling, submission of specimen other than nasopharyngeal swab, presence of viral mutation(s) within the areas targeted by this assay, and inadequate number of viral copies (<131 copies/mL). A negative result must be combined with clinical observations, patient history, and epidemiological information. The expected result is Negative. Fact Sheet for Patients:  PinkCheek.be Fact Sheet for Healthcare Providers:  GravelBags.it This test is not yet ap proved or cleared by the [place] FDA and  has been authorized for detection and/or diagnosis of SARS-CoV-2 by FDA under an Emergency Use Authorization (EUA). This EUA will remain  in effect (meaning this test can be used) for the duration of the COVID-19 declaration under Section 564(b)(1) of the Act, 21 U.S.C. section 360bbb-3(b)(1), unless the authorization is terminated or revoked sooner.    Influenza A by PCR NEGATIVE NEGATIVE Final   Influenza B by PCR NEGATIVE NEGATIVE Final    Comment: (NOTE) The Xpert Xpress SARS-CoV-2/FLU/RSV assay is intended as an aid in  the  diagnosis of influenza from Nasopharyngeal swab specimens and  should not be used as a sole basis for treatment. Nasal washings and  aspirates are unacceptable for Xpert Xpress SARS-CoV-2/FLU/RSV  testing. Fact Sheet for Patients: PinkCheek.be Fact Sheet for Healthcare Providers: GravelBags.it This test is not yet approved or cleared by the [place] FDA and  has been authorized for detection and/or diagnosis of SARS-CoV-2 by  FDA under an Emergency Use Authorization (EUA). This EUA will remain  in effect (meaning this test can be used) for the duration of the  Covid-19 declaration under Section 564(b)(1) of the Act, 21  U.S.C. section  360bbb-3(b)(1), unless the authorization is  terminated or revoked. Performed at [name], [address] [place], [place] [address]   MRSA PCR Screening     Status: None   Collection Time: 10/21/19  4:29 AM   Specimen: Nasal Mucosa; Nasopharyngeal  Result Value Ref Range Status   MRSA by PCR NEGATIVE NEGATIVE Final    Comment:        The GeneXpert MRSA Assay (FDA approved for NASAL specimens only), is one component of a comprehensive MRSA colonization surveillance program. It is not intended to diagnose MRSA infection nor to guide or monitor treatment for MRSA infections. Performed at [facility], [place] [address], [place], [place] [address]     Radiology Studies: No results found.  Scheduled Meds: . [MAR Hold]  stroke: mapping our early stages of recovery book   Does not apply Once  . [MAR Hold] aspirin EC  325 mg Oral Daily  . [MAR Hold] atorvastatin  40 mg Oral q1800  . [MAR Hold] clopidogrel  75 mg Oral Daily  . [MAR Hold] cyanocobalamin  1,000 mcg Intramuscular Weekly  . fentaNYL      . [MAR Hold] insulin aspart  0-5 Units Subcutaneous QHS  . [MAR Hold] insulin aspart  0-9 Units Subcutaneous TID WC  . [MAR Hold] nicotine  7 mg Transdermal Daily  . ondansetron       Continuous Infusions: . sodium chloride 75 mL/hr at 10/20/19 1224  . DOPamine    . DOPamine 20 mcg/kg/min (10/21/19 1159)  . phenylephrine       LOS: 4 days   Time spent: [place], MD [facility]   To contact the attending provider between 7A-7P or the covering provider during after hours 7P-7A, please log into the web site [email or web address] and access using universal [place] password for that web site. If you do not have the password, please call the hospital operator.  10/21/2019, 12:08 PM

## 2019-10-21 NOTE — Progress Notes (Signed)
  Progress Note    10/21/2019 7:11 AM Day of Surgery  Subjective:  No overnight issues  Vitals:   10/21/19 0008 10/21/19 0433  BP: (!) 143/80 134/71  Pulse: (!) 59 60  Resp: 18 19  Temp: 98 F (36.7 C) 97.6 F (36.4 C)  SpO2: 100% 98%    Physical Exam: [id number] Non labored respirations Moving all extremities without limitation   CBC    Component Value Date/Time   WBC 5.0 10/21/2019 0517   RBC 3.56 (L) 10/21/2019 0517   HGB 11.1 (L) 10/21/2019 0517   HGB 12.7 09/01/2014 2130   HCT 32.3 (L) 10/21/2019 0517   HCT 37.9 09/01/2014 2130   PLT 202 10/21/2019 0517   PLT 260 09/05/2014 0517   MCV 90.7 10/21/2019 0517   MCV 92 09/01/2014 2130   MCH 31.2 10/21/2019 0517   MCHC 34.4 10/21/2019 0517   RDW 12.7 10/21/2019 0517   RDW 13.9 09/01/2014 2130   LYMPHSABS 2.1 10/16/2019 1717   MONOABS 0.3 10/16/2019 1717   EOSABS 0.3 10/16/2019 1717   BASOSABS 0.0 10/16/2019 1717    BMET    Component Value Date/Time   NA 141 10/21/2019 0517   NA 141 01/18/2016 1150   NA 138 09/01/2014 2130   K 3.8 10/21/2019 0517   K 3.5 09/01/2014 2130   CL 109 10/21/2019 0517   CL 104 09/01/2014 2130   CO2 24 10/21/2019 0517   CO2 28 09/01/2014 2130   GLUCOSE 103 (H) 10/21/2019 0517   GLUCOSE 287 (H) 09/01/2014 2130   BUN 13 10/21/2019 0517   BUN 12 01/18/2016 1150   BUN 14 09/01/2014 2130   CREATININE 0.80 10/21/2019 0517   CREATININE 0.80 09/30/2018 1151   CALCIUM 9.1 10/21/2019 0517   CALCIUM 8.9 09/01/2014 2130   GFRNONAA >60 10/21/2019 0517   GFRNONAA 75 09/30/2018 1151   GFRAA >60 10/21/2019 0517   GFRAA 87 09/30/2018 1151    INR    Component Value Date/Time   INR 1.1 10/16/2019 1717     Intake/Output Summary (Last 24 hours) at 10/21/2019 0711 Last data filed at 10/21/2019 0002 Gross per 24 hour  Intake 200 ml  Output 1200 ml  Net -1000 ml     Assessment/plan:  74 y.o. female is here with symptomatic right ica stenosis. Also has left string sign. Plan for R CEA  today.   Donyale Berthold C. Donzetta Matters, MD [facility] [place] Office: [phone] Pager: [phone]  10/21/2019 7:11 AM

## 2019-10-21 NOTE — Op Note (Signed)
° ° °  Patient name: Kendra [name] MRN: [id number] DOB: Nov 01, 1945 Sex: female  10/21/2019 Pre-operative Diagnosis: symptomatic right carotid stenosis Post-operative diagnosis:  Same Surgeon:  Erlene Quan C. Donzetta Matters, MD Assistant: Risa Grill, PA Procedure Performed: Right carotid endarterectomy with bovine pericardial patch angioplasty   Indications: 74 year old female admitted with what was ultimately considered an right MCA distribution TIA with high-grade stenosis of the right ICA and also has critical left ICA stenosis that has been asymptomatic. She is now indicated for right carotid endarterectomy and will need interval left carotid endarterectomy.  Findings: There was a focal napkin ring type highly calcified stenosis at the IC bifurcation. Distally the artery was healthy. Procedure was performed with shunt in place. At completion of endarterectomy there was good feathering distally and a strong signal with flow throughout diastole was noted after patch angioplasty. Patient was neurologically intact upon awakening.   Procedure:  The patient was identified in the holding area and taken to the operating where she is placed upon operative when general anesthesia induced. She was sterilely prepped and draped in the neck and chest in usual fashion antibiotics were administered and a timeout was called. We began with incision along the anterior border the sternocleidomastoid. We dissected down through the platysma subcutaneous tissue identify the common carotid artery. On umbilical tape was placed around this and the vagus nerve was protected. Patient was fully heparinized at this time and ACT returned to 70. We dissected a prior. We identified the facial vein there was a hypoglossal nerve very close to this. The vein was ligated between ties in multiple locations and the nerve was protected. We encircled the superior thyroidal vessel with vessel loop as well as the external carotid artery higher. We  dissected higher up on the ICA. The ansa cervicalis was divided between clips. Where the ICA was normal we placed a vessel loop around this. A 12 French shunt was prepared. The ICA was clamped followed by the common carotid and external. The vessel was opened longitudinally. The shunt was placed distally allowed to backbleed and placed proximally. Flow was confirmed via Doppler. We proceeded with endarterectomy had good feathering distally. We thoroughly irrigated with heparinized saline. A bovine pericardial patch was prepared and sewn in place with 6-0 Prolene suture. Prior to completion of the suture line we thoroughly irrigated. We removed our shunt allowed backbleeding as well as antegrade bleeding. We again thoroughly irrigated. We completed our patch angioplasty. We then released our external carotid clamp followed by common carotid clamp. After several cardiac cycles released our internal clamp. We had very good signal in the internal carotid artery with flow throughout diastole. Satisfied with this 50 mg of protamine was administered. We obtain hemostasis irrigated the wound. We closed the platysma followed by the skin with Monocryl. Dermabond is placed at the level the skin. She was awakened anesthesia noted to be neurologically intact was transferred to the PACU in stable condition.  EBL: 100 cc    Ilma Achee C. Donzetta Matters, MD [facility] [place] Office: [phone] Pager: [phone]

## 2019-10-21 NOTE — Anesthesia Procedure Notes (Signed)
Arterial Line Insertion Start/End2/10/2019 7:00 AM, 10/21/2019 7:10 AM Performed by: Milford Cage, CRNA, CRNA  Patient location: Pre-op. Preanesthetic checklist: patient identified, IV checked, site marked, risks and benefits discussed, surgical consent, monitors and equipment checked, pre-op evaluation, timeout performed and anesthesia consent Lidocaine 1% used for infiltration Right, radial was placed Catheter size: 20 G Hand hygiene performed  and maximum sterile barriers used   Attempts: 1 Procedure performed without using ultrasound guided technique. Following insertion, dressing applied and Biopatch. Post procedure assessment: normal and unchanged  Patient tolerated the procedure well with no immediate complications. Additional procedure comments: Performed by Lyndle Herrlich.

## 2019-10-22 ENCOUNTER — Encounter: Payer: Self-pay | Admitting: Nurse Practitioner

## 2019-10-22 ENCOUNTER — Encounter: Payer: Self-pay | Admitting: *Deleted

## 2019-10-22 ENCOUNTER — Telehealth: Payer: Self-pay | Admitting: Nurse Practitioner

## 2019-10-22 LAB — BASIC METABOLIC PANEL
Anion gap: 8 (ref 5–15)
BUN: 8 mg/dL (ref 8–23)
CO2: 23 mmol/L (ref 22–32)
Calcium: 9 mg/dL (ref 8.9–10.3)
Chloride: 108 mmol/L (ref 98–111)
Creatinine, Ser: 0.73 mg/dL (ref 0.44–1.00)
GFR calc Af Amer: >60 mL/min (ref >60)
GFR calc non Af Amer: >60 mL/min (ref >60)
Glucose, Bld: 165 mg/dL — ABNORMAL HIGH (ref 70–99)
Potassium: 3.7 mmol/L (ref 3.5–5.1)
Sodium: 139 mmol/L (ref 135–145)

## 2019-10-22 LAB — CBC
HCT: 31.9 % — ABNORMAL LOW (ref 36.0–46.0)
Hemoglobin: 10.8 g/dL — ABNORMAL LOW (ref 12.0–15.0)
MCH: 31.5 pg (ref 26.0–34.0)
MCHC: 33.9 g/dL (ref 30.0–36.0)
MCV: 93 fL (ref 80.0–100.0)
Platelets: 259 10*3/uL (ref 150–400)
RBC: 3.43 MIL/uL — ABNORMAL LOW (ref 3.87–5.11)
RDW: 12.7 % (ref 11.5–15.5)
WBC: 6.4 10*3/uL (ref 4.0–10.5)
nRBC: 0 % (ref 0.0–0.2)

## 2019-10-22 LAB — GLUCOSE, CAPILLARY
Glucose-Capillary: 141 mg/dL — ABNORMAL HIGH (ref 70–99)
Glucose-Capillary: 141 mg/dL — ABNORMAL HIGH (ref 70–99)
Glucose-Capillary: 144 mg/dL — ABNORMAL HIGH (ref 70–99)
Glucose-Capillary: 153 mg/dL — ABNORMAL HIGH (ref 70–99)
Glucose-Capillary: 186 mg/dL — ABNORMAL HIGH (ref 70–99)
Glucose-Capillary: 85 mg/dL (ref 70–99)

## 2019-10-22 LAB — MAGNESIUM: Magnesium: 1.7 mg/dL (ref 1.7–2.4)

## 2019-10-22 MED ORDER — UMECLIDINIUM BROMIDE 62.5 MCG/INH IN AEPB
1.0000 | INHALATION_SPRAY | Freq: Every day | RESPIRATORY_TRACT | Status: DC
  Administered 2019-10-23: 1 via RESPIRATORY_TRACT
  Filled 2019-10-22: qty 7

## 2019-10-22 MED ORDER — FLUTICASONE-UMECLIDIN-VILANT 100-62.5-25 MCG/INH IN AEPB: 1.0000 | INHALATION_SPRAY | Freq: Every day | RESPIRATORY_TRACT | Status: DC

## 2019-10-22 MED ORDER — FLUTICASONE FUROATE-VILANTEROL 100-25 MCG/INH IN AEPB
1.0000 | INHALATION_SPRAY | Freq: Every day | RESPIRATORY_TRACT | Status: DC
  Administered 2019-10-23: 1 via RESPIRATORY_TRACT
  Filled 2019-10-22: qty 28

## 2019-10-22 NOTE — Progress Notes (Addendum)
  Progress Note    10/22/2019 8:44 AM 1 Day Post-Op  Subjective:  Denies any further L sided weakness or any other stroke symptoms   Vitals:   10/22/19 0700 10/22/19 0807  BP: (!) 155/55   Pulse: 76   Resp: 17   Temp:  97.7 F (36.5 C)  SpO2: 93%    Physical Exam: Lungs:  Non labored Incisions:  R neck incision c/d/i Extremities:  Moving all extremities well Abdomen:  Soft Neurologic: CN grossly intact  CBC    Component Value Date/Time   WBC 6.4 10/22/2019 0451   RBC 3.43 (L) 10/22/2019 0451   HGB 10.8 (L) 10/22/2019 0451   HGB 12.7 09/01/2014 2130   HCT 31.9 (L) 10/22/2019 0451   HCT 37.9 09/01/2014 2130   PLT 259 10/22/2019 0451   PLT 260 09/05/2014 0517   MCV 93.0 10/22/2019 0451   MCV 92 09/01/2014 2130   MCH 31.5 10/22/2019 0451   MCHC 33.9 10/22/2019 0451   RDW 12.7 10/22/2019 0451   RDW 13.9 09/01/2014 2130   LYMPHSABS 2.1 10/16/2019 1717   MONOABS 0.3 10/16/2019 1717   EOSABS 0.3 10/16/2019 1717   BASOSABS 0.0 10/16/2019 1717    BMET    Component Value Date/Time   NA 139 10/22/2019 0451   NA 141 01/18/2016 1150   NA 138 09/01/2014 2130   K 3.7 10/22/2019 0451   K 3.5 09/01/2014 2130   CL 108 10/22/2019 0451   CL 104 09/01/2014 2130   CO2 23 10/22/2019 0451   CO2 28 09/01/2014 2130   GLUCOSE 165 (H) 10/22/2019 0451   GLUCOSE 287 (H) 09/01/2014 2130   BUN 8 10/22/2019 0451   BUN 12 01/18/2016 1150   BUN 14 09/01/2014 2130   CREATININE 0.73 10/22/2019 0451   CREATININE 0.80 09/30/2018 1151   CALCIUM 9.0 10/22/2019 0451   CALCIUM 8.9 09/01/2014 2130   GFRNONAA >60 10/22/2019 0451   GFRNONAA 75 09/30/2018 1151   GFRAA >60 10/22/2019 0451   GFRAA 87 09/30/2018 1151    INR    Component Value Date/Time   INR 1.1 10/16/2019 1717     Intake/Output Summary (Last 24 hours) at 10/22/2019 0844 Last data filed at 10/22/2019 0600 Gross per 24 hour  Intake 6552.47 ml  Output 1690 ml  Net 4862.47 ml     Assessment/Plan:  74 y.o. female is  s/p R CEA 1 Day Post-Op   Neuro exam stable compared to pre-op R neck incision unremarkable Still requiring pressor support and A line; defer management to primary team [place] for discharge from vascular standpoint when medically stable; patient may need L CEA when recovered from R CEA   Dagoberto Ligas, PA-C [facility] [phone] 10/22/2019 8:44 AM   I have seen and evaluated the patient. I agree with the PA note as documented above.  74 year old female postop day 1 status post right carotid endarterectomy.  Neck looks good.  Patient is neuro intact.  Remains on 10 mcg dopamine this am.  Discussed with nurse to wean dopamine to MAPs above 60 and then hopefully can DC A-line.  Appears to making good postoperative progress.  Marty Heck, MD [facility] [place] Office: [phone]

## 2019-10-22 NOTE — Progress Notes (Signed)
PROGRESS NOTE    Kendra [name]  TIR:[id number] DOB: 1946/05/23 DOA: 10/17/2019 PCP: [name], NP (Inactive)   Brief Narrative: 74 year old female with history of hypertension, HLD, T2DM, diabetes, RA, current active smoker, previous CVA with most recent stroke in March 2020 secondary to right M1 occlusion s/p TPA and thrombectomy with residual left hemiparesis presented to ER at [facility] with complaint of worsening weakness on the left side-as patient could not walk, left face was droopy.  ER CT showed chronic appearing ischemia in the right MCA territory, seen by teleneurology, not a candidate for TPA given lack of new disabling deficits on exam and inconsistent AKI Clinical history.  Follow-up CT angios showed right MCA M2 occlusion/critical stenosis, chronic, not a candidate for thrombectomy due to low NIH scale.  Dr. Lorraine Lax from neurology was consulted, then transferred to [facility] for vascular surgery evaluation Seen by vascular surgery, will to have a symptomatic right ICA stenosis and left a string sign underwent Rt CEA 10/21/19 by Dr Donzetta Matters.  Postop transferred to ICU  Subjective: Resting on bedside chair, no headache, neck pain weakness. On RA Overnight T-max 99.2, blood pressure 676P to 950D systolic, saturating well on room air. Patient on dopamine infusion still and unable to wean down. CBC BMP fairly stable this morning.  Assessment & Plan:  Worsening left-sided weakness 2/2 right brain TIA likely due to right ICA high-grade stenosis: s/p CEA 10/21/19 by Dr Donzetta Matters.  Postop transferred to ICU.  Seen by neurology.  Patient completed stroke work-up per neuro who signed off- TTE EF 60 to 65%, carotid Doppler right 40 to 59%, left ICA 67% stenosis, LDL 37, as be A1c 6.2, MRI no acute finding old right MCA infarct, small vessel disease, CT perfusion no core infarct. On ASA '325mg'$ , Plavix '75mg'$  and Lipitor 40 mg.  Postop on dopamine infusion in ICU,  History of previous strokes-cont  home antipltatelets as above.  HLD-cont statins  HTN- no dopamine still post op.  Tobacco use disorder-cessation advised personally  T2DM, controlled, with complications, TOI7T 6.2. cont ssi  Body mass index is 20.17 kg/m.   DVT prophylaxis: SCD Code Status:FULL  Family Communication: plan of care discussed with patient at bedside. Disposition Plan:Patient is from: home w son/DIL Anticipated d/c to: HHPT in 1-2 days. Barriers to d/c or conditions that needs to be met prior to d/c: off pressors, stable post op course and once vascular signs off.  Consultants: Vascular surgery, neurology. Procedures:see note-R CEA 2/2 Microbiology:none Antimicrobials: Anti-infectives (From admission, onward)   Start     Dose/Rate Route Frequency Ordered Stop   10/21/19 1600  ceFAZolin (ANCEF) IVPB 2g/100 mL premix     2 g 200 mL/hr over 30 Minutes Intravenous Every 8 hours 10/21/19 1504 10/22/19 0038   10/21/19 0600  ceFAZolin (ANCEF) IVPB 2g/100 mL premix     2 g 200 mL/hr over 30 Minutes Intravenous To Short Stay 10/20/19 0809 10/21/19 0547      Medications: Scheduled Meds: .  stroke: mapping our early stages of recovery book   Does not apply Once  . aspirin EC  325 mg Oral Daily  . atorvastatin  40 mg Oral q1800  . Chlorhexidine Gluconate Cloth  6 each Topical Daily  . clopidogrel  75 mg Oral Daily  . cyanocobalamin  1,000 mcg Intramuscular Weekly  . docusate sodium  100 mg Oral Daily  . insulin aspart  0-5 Units Subcutaneous QHS  . insulin aspart  0-9 Units Subcutaneous TID WC  .  mouth rinse  15 mL Mouth Rinse BID  . nicotine  7 mg Transdermal Daily  . pantoprazole  40 mg Oral Daily   Continuous Infusions: . sodium chloride Stopped (10/21/19 0602)  . sodium chloride 75 mL/hr at 10/22/19 0600  . DOPamine 13 mcg/kg/min (10/22/19 0600)  . magnesium sulfate bolus IVPB    . phenylephrine      Objective: Vitals:   10/22/19 0400 10/22/19 0500 10/22/19 0601 10/22/19 0700  BP:  (!) 144/69 126/70 (!) 146/46 (!) 155/55  Pulse: (!) 50 84 (!) 52 76  Resp: 18 (!) '21 19 17  '$ Temp:   98.6 F (37 C)   TempSrc:      SpO2: 95% 94% 96% 93%  Weight:  53.3 kg    Height:        Intake/Output Summary (Last 24 hours) at 10/22/2019 0733 Last data filed at 10/22/2019 0600 Gross per 24 hour  Intake 6552.47 ml  Output 1690 ml  Net 4862.47 ml   Filed Weights   10/18/19 2154 10/22/19 0500  Weight: 57 kg 53.3 kg   Weight change:   Body mass index is 20.17 kg/m.  Intake/Output from previous day: 02/02 0701 - 02/03 0700 In: 6552.5 [I.V.:6452.5; IV Piggyback:100] Out: 5726 [Urine:1650; Blood:40] Intake/Output this shift: No intake/output data recorded.  Examination:  General exam: AAOx3,NAD, Weak appearing. HEENT:Oral mucosa moist, Ear/Nose WNL grossly, dentition normal. Respiratory system: Diminished at the base,no wheezing or crackles,no use of accessory muscle Cardiovascular system: S1 & S2 +, No JVD,. Gastrointestinal system: Abdomen soft, NT,ND, BS+ Nervous System:Alert, awake, moving extremities with good b/l strength in UE/LE,  Extremities: No edema, distal peripheral pulses palpable.  Skin: No rashes,no icterus. MSK: Normal muscle bulk,tone, power Rt neck surgical site in glue- mildly tender, no swelling.  Data Reviewed: I have personally reviewed following labs and imaging studies  CBC: Recent Labs  Lab 10/16/19 1717 10/21/19 0517 10/22/19 0451  WBC 6.3 5.0 6.4  NEUTROABS 3.6  --   --   HGB 11.4* 11.1* 10.8*  HCT 35.2* 32.3* 31.9*  MCV 93.9 90.7 93.0  PLT 230 202 203   Basic Metabolic Panel: Recent Labs  Lab 10/16/19 1717 10/21/19 0517 10/22/19 0451  NA 141 141 139  K 3.7 3.8 3.7  CL 106 109 108  CO2 '25 24 23  '$ GLUCOSE 102* 103* 165*  BUN '14 13 8  '$ CREATININE 0.78 0.80 0.73  CALCIUM 9.6 9.1 9.0  MG  --   --  1.7   GFR: Estimated Creatinine Clearance: 54.3 mL/min (by C-G formula based on SCr of 0.73 mg/dL). Liver Function Tests: Recent  Labs  Lab 10/16/19 1717  AST 28  ALT 26  ALKPHOS 65  BILITOT 0.6  PROT 7.2  ALBUMIN 4.0   No results for input(s): LIPASE, AMYLASE in the last 168 hours. No results for input(s): AMMONIA in the last 168 hours. Coagulation Profile: Recent Labs  Lab 10/16/19 1717  INR 1.1   Cardiac Enzymes: No results for input(s): CKTOTAL, CKMB, CKMBINDEX, TROPONINI in the last 168 hours. BNP (last 3 results) No results for input(s): PROBNP in the last 8760 hours. HbA1C: No results for input(s): HGBA1C in the last 72 hours. CBG: Recent Labs  Lab 10/21/19 1525 10/21/19 1949 10/21/19 2218 10/21/19 2337 10/22/19 0341  GLUCAP 175* 164* 154* 128* 144*   Lipid Profile: No results for input(s): CHOL, HDL, LDLCALC, TRIG, CHOLHDL, LDLDIRECT in the last 72 hours. Thyroid Function Tests: No results for input(s): TSH, T4TOTAL, FREET4,  T3FREE, THYROIDAB in the last 72 hours. Anemia Panel: No results for input(s): VITAMINB12, FOLATE, FERRITIN, TIBC, IRON, RETICCTPCT in the last 72 hours. Sepsis Labs: No results for input(s): PROCALCITON, LATICACIDVEN in the last 168 hours.  Recent Results (from the past 240 hour(s))  Respiratory Panel by RT PCR (Flu A&B, Covid) - Nasopharyngeal Swab     Status: None   Collection Time: 10/16/19  6:36 PM   Specimen: Nasopharyngeal Swab  Result Value Ref Range Status   SARS Coronavirus 2 by RT PCR NEGATIVE NEGATIVE Final    Comment: (NOTE) SARS-CoV-2 target nucleic acids are NOT DETECTED. The SARS-CoV-2 RNA is generally detectable in upper respiratoy specimens during the acute phase of infection. The lowest concentration of SARS-CoV-2 viral copies this assay can detect is 131 copies/mL. A negative result does not preclude SARS-Cov-2 infection and should not be used as the sole basis for treatment or other patient management decisions. A negative result may occur with  improper specimen collection/handling, submission of specimen other than nasopharyngeal swab,  presence of viral mutation(s) within the areas targeted by this assay, and inadequate number of viral copies (<131 copies/mL). A negative result must be combined with clinical observations, patient history, and epidemiological information. The expected result is Negative. Fact Sheet for Patients:  PinkCheek.be Fact Sheet for Healthcare Providers:  GravelBags.it This test is not yet ap proved or cleared by the [place] FDA and  has been authorized for detection and/or diagnosis of SARS-CoV-2 by FDA under an Emergency Use Authorization (EUA). This EUA will remain  in effect (meaning this test can be used) for the duration of the COVID-19 declaration under Section 564(b)(1) of the Act, 21 U.S.C. section 360bbb-3(b)(1), unless the authorization is terminated or revoked sooner.    Influenza A by PCR NEGATIVE NEGATIVE Final   Influenza B by PCR NEGATIVE NEGATIVE Final    Comment: (NOTE) The Xpert Xpress SARS-CoV-2/FLU/RSV assay is intended as an aid in  the diagnosis of influenza from Nasopharyngeal swab specimens and  should not be used as a sole basis for treatment. Nasal washings and  aspirates are unacceptable for Xpert Xpress SARS-CoV-2/FLU/RSV  testing. Fact Sheet for Patients: PinkCheek.be Fact Sheet for Healthcare Providers: GravelBags.it This test is not yet approved or cleared by the [place] FDA and  has been authorized for detection and/or diagnosis of SARS-CoV-2 by  FDA under an Emergency Use Authorization (EUA). This EUA will remain  in effect (meaning this test can be used) for the duration of the  Covid-19 declaration under Section 564(b)(1) of the Act, 21  U.S.C. section 360bbb-3(b)(1), unless the authorization is  terminated or revoked. Performed at [facility], [address] [place], [place] [address]   MRSA PCR Screening      Status: None   Collection Time: 10/21/19  4:29 AM   Specimen: Nasal Mucosa; Nasopharyngeal  Result Value Ref Range Status   MRSA by PCR NEGATIVE NEGATIVE Final    Comment:        The GeneXpert MRSA Assay (FDA approved for NASAL specimens only), is one component of a comprehensive MRSA colonization surveillance program. It is not intended to diagnose MRSA infection nor to guide or monitor treatment for MRSA infections. Performed at [facility], [place] [address], [place], [place] [address]       Radiology Studies: No results found.   LOS: 5 days   Time spent: More than 50% of that time was spent in counseling and/or coordination of care.  Antonieta Pert, MD [facility]  10/22/2019, 7:33 AM

## 2019-10-22 NOTE — Telephone Encounter (Signed)
   [facility] [address] [place], [place]  [address] Phone:  [phone]   Fax:  [phone]   October 22, 2019   [address] [place] [address]   Dear Ms. Monte,  Thank you for taking the time to speak with me on the phone today regarding community resources for transportation.  Enclosed are some of these resources that you may find helpful, please call me at the number listed below if you have any follow-up questions.    [place] . Embedded Care Coordination [place]  Care Management ??[email or web address] ??[phone]         [place], [id number]                              1

## 2019-10-22 NOTE — Telephone Encounter (Signed)
    Called pt regarding [employer] Referral for . Patient is a 74 y.o. female  and is under the care of [name], NP (Inactive)  Pt will be discharged from hospital tomorrow morning and will be back home to give me the information for her Feb 9th appt (provider not listed in Epic) Will c/b on 2/4 Pt is eligible for [employer]. Will mail [place] for her future reference.  [place] . Embedded Care Coordination [place]  Care Management ??[email or web address]  ??[phone]

## 2019-10-22 NOTE — Progress Notes (Signed)
Physical Therapy Treatment Patient Details Name: Kendra [name] MRN: [id number] DOB: 1946/07/18 Today's Date: 10/22/2019    History of Present Illness Kendra [name] is a 74 y.o. female with PMH of hypertension, hyperlipidemia, DM2, prior CVA with most recent stroke in March 2020 secondary to right M1 occlusion status post IV TPA and thrombectomy who presented with worsening weakness on the left side. MRI negative for acute finding. CTA showing occluded right M2 and severely stenosed left ICA. Pt not a candidate for mechnical thrombectomy as M2 occlusion/stenosis may be chronic.    PT Comments    Pt tolerated treatment well, reporting that her ambulation is getting closer to baseline. Pt demonstrates dynamic balance impairments, requiring supervision for most OOB mobility at this time. Pt will benefit from continued acute PT POC to restore independence and further reduce falls risk during dynamic activities.   Follow Up Recommendations  Home health PT;Supervision for mobility/OOB     Equipment Recommendations  None recommended by PT    Recommendations for Other Services       Precautions / Restrictions Precautions Precautions: Fall Restrictions Weight Bearing Restrictions: No    Mobility  Bed Mobility Overal bed mobility: (pt received and left sitting in recliner)                Transfers Overall transfer level: Needs assistance Equipment used: Rolling walker (2 wheeled) Transfers: Sit to/from Stand Sit to Stand: Supervision            Ambulation/Gait Ambulation/Gait assistance: Supervision Gait Distance (Feet): 400 Feet Assistive device: Rolling walker (2 wheeled) Gait Pattern/deviations: Step-through pattern Gait velocity: decreased Gait velocity interpretation: 1.31 - 2.62 ft/sec, indicative of limited community ambulator General Gait Details: pt with steady step through gait with BUE support of RW   Stairs             Wheelchair Mobility     Modified Rankin (Stroke Patients Only) Modified Rankin (Stroke Patients Only) Pre-Morbid Rankin Score: Moderate disability Modified Rankin: Moderately severe disability     Balance Overall balance assessment: Needs assistance Sitting-balance support: Feet supported;No upper extremity supported Sitting balance-Leahy Scale: Good Sitting balance - Comments: supervision   Standing balance support: Single extremity supported Standing balance-Leahy Scale: Good Standing balance comment: supervision with unilateral UE support of RW                            Cognition Arousal/Alertness: Awake/alert Behavior During Therapy: WFL for tasks assessed/performed Overall Cognitive Status: Impaired/Different from baseline Area of Impairment: Safety/judgement                         Safety/Judgement: Decreased awareness of deficits;Decreased awareness of safety            Exercises      General Comments General comments (skin integrity, edema, etc.): VSS on RA      Pertinent Vitals/Pain Pain Assessment: No/denies pain    Home Living                      Prior Function            PT Goals (current goals can now be found in the care plan section) Acute Rehab PT Goals Patient Stated Goal: home when able Progress towards PT goals: Progressing toward goals    Frequency    Min 3X/week      PT Plan Current plan remains  appropriate    Co-evaluation              AM-PAC PT "6 Clicks" Mobility   Outcome Measure  Help needed turning from your back to your side while in a flat bed without using bedrails?: None Help needed moving from lying on your back to sitting on the side of a flat bed without using bedrails?: None Help needed moving to and from a bed to a chair (including a wheelchair)?: None Help needed standing up from a chair using your arms (e.g., wheelchair or bedside chair)?: None Help needed to walk in hospital room?:  None Help needed climbing 3-5 steps with a railing? : A Little 6 Click Score: 23    End of Session Equipment Utilized During Treatment: (none) Activity Tolerance: Patient tolerated treatment well Patient left: in chair;with call bell/phone within reach Nurse Communication: Mobility status PT Visit Diagnosis: Unsteadiness on feet (R26.81);Other abnormalities of gait and mobility (R26.89);History of falling (Z91.81);Difficulty in walking, not elsewhere classified (R26.2)     Time: [id number] PT Time Calculation (min) (ACUTE ONLY): 27 min  Charges:  $Gait Training: 23-37 mins                     Zenaida Niece, PT, DPT Acute Rehabilitation Pager: [phone]    Zenaida Niece 10/22/2019, 3:38 PM

## 2019-10-23 ENCOUNTER — Other Ambulatory Visit: Payer: Self-pay | Admitting: Family Medicine

## 2019-10-23 ENCOUNTER — Other Ambulatory Visit: Payer: Self-pay | Admitting: Nurse Practitioner

## 2019-10-23 ENCOUNTER — Ambulatory Visit (INDEPENDENT_AMBULATORY_CARE_PROVIDER_SITE_OTHER): Payer: Medicare Other | Admitting: Pharmacist

## 2019-10-23 DIAGNOSIS — E118 Type 2 diabetes mellitus with unspecified complications: Secondary | ICD-10-CM

## 2019-10-23 DIAGNOSIS — E1165 Type 2 diabetes mellitus with hyperglycemia: Secondary | ICD-10-CM

## 2019-10-23 DIAGNOSIS — I6522 Occlusion and stenosis of left carotid artery: Secondary | ICD-10-CM

## 2019-10-23 DIAGNOSIS — IMO0002 Reserved for concepts with insufficient information to code with codable children: Secondary | ICD-10-CM

## 2019-10-23 DIAGNOSIS — I10 Essential (primary) hypertension: Secondary | ICD-10-CM

## 2019-10-23 DIAGNOSIS — F3341 Major depressive disorder, recurrent, in partial remission: Secondary | ICD-10-CM

## 2019-10-23 DIAGNOSIS — E785 Hyperlipidemia, unspecified: Secondary | ICD-10-CM

## 2019-10-23 DIAGNOSIS — Z794 Long term (current) use of insulin: Secondary | ICD-10-CM

## 2019-10-23 DIAGNOSIS — R519 Headache, unspecified: Secondary | ICD-10-CM

## 2019-10-23 LAB — GLUCOSE, CAPILLARY
Glucose-Capillary: 75 mg/dL (ref 70–99)
Glucose-Capillary: 97 mg/dL (ref 70–99)

## 2019-10-23 MED ORDER — CLOPIDOGREL BISULFATE 75 MG PO TABS: 75.0000 mg | ORAL_TABLET | Freq: Every day | ORAL | 0 refills | Status: DC

## 2019-10-23 MED ORDER — ASPIRIN 325 MG PO TBEC: 325.0000 mg | DELAYED_RELEASE_TABLET | Freq: Every day | ORAL | 1 refills | Status: AC

## 2019-10-23 NOTE — Progress Notes (Signed)
Art line removed per md order as the art line and cuff correlate. Pt ambulated 160 feet with avasys walker. PT blood pressure upon completing walk 91/55 ( 67). HR 63. Pt now resting in bed. Will continue to monitor.   Lucius Conn, RN

## 2019-10-23 NOTE — Discharge Instructions (Signed)
Carotid Artery Disease  Carotid artery disease is the narrowing or blockage of one or both carotid arteries. This condition is also called carotid artery stenosis. The carotid arteries are the two main blood vessels on either side of the neck. They send blood to the brain, other parts of the head, and the neck.  This condition increases your risk for a stroke or a transient ischemic attack (TIA). A TIA is a "mini-stroke" that causes stroke-like symptoms that go away quickly. What are the causes? This condition is mainly caused by a narrowing and hardening of the carotid arteries. The carotid arteries can become narrow or clogged with a buildup of plaque. Plaque includes:  Fat.  Cholesterol.  Calcium.  Other substances. What increases the risk? The following factors may make you more likely to develop this condition:  Having certain medical conditions, such as: ? High cholesterol. ? High blood pressure. ? Diabetes. ? Obesity.  Smoking.  A family history of cardiovascular disease.  Not being active or lack of regular exercise.  Being female. Men have a higher risk of having arteries become narrow and harden earlier in life than women.  Old age. What are the signs or symptoms? This condition may not have any signs or symptoms until a stroke or TIA happens. In some cases, your doctor may be able to hear a whooshing sound. This can suggest a change in blood flow caused by plaque buildup. An eye exam can also help find signs of the condition. How is this treated? This condition may be treated with more than one treatment. Treatment options include:  Lifestyle changes, such as: ? Quitting smoking. ? Getting regular exercise, or getting exercise as told by your doctor. ? Eating a healthy diet. ? Managing stress. ? Keeping a healthy weight.  Medicines to control: ? Blood pressure. ? Cholesterol. ? Blood clotting.  Surgery. You may have: ? A surgery to remove the blockages in  the carotid arteries. ? A procedure in which a small mesh tube (stent) is used to widen the blocked carotid arteries. Follow these instructions at home: Eating and drinking Follow instructions about your diet from your doctor. It is important to follow a healthy diet.  Eat a diet that includes: ? A lot of fresh fruits and vegetables. ? Low-fat (lean) meats.  Avoid these foods: ? Foods that are high in fat. ? Foods that are high in salt (sodium). ? Foods that are fried. ? Foods that are processed. ? Foods that have few good nutrients (poor nutritional value).  Lifestyle   Keep a healthy weight.  Do exercises as told by your doctor to stay active. Each week, you should get one of the following: ? At least 150 minutes of exercise that raises your heart rate and makes you sweat (moderate-intensity exercise). ? At least 75 minutes of exercise that takes a lot of effort.  Do not use any products that contain nicotine or tobacco, such as cigarettes, e-cigarettes, and chewing tobacco. If you need help quitting, ask your doctor.  Do not drink alcohol if: ? Your doctor tells you not to drink. ? You are pregnant, may be pregnant, or are planning to become pregnant.  If you drink alcohol: ? Limit how much you use to:  0-1 drink a day for women.  0-2 drinks a day for men. ? Be aware of how much alcohol is in your drink. In the [place] one drink equals one 12 oz bottle of beer (355 mL), one 5   oz glass of wine (148 mL), or one 1 oz glass of hard liquor (44 mL).  Do not use drugs.  Manage your stress. Ask your doctor for tips on how to do this. General instructions  Take over-the-counter and prescription medicines only as told by your doctor.  Keep all follow-up visits as told by your doctor. This is important. Where to find more information  American Heart Association: www.heart.org Get help right away if:  You have any signs of a stroke. "BE FAST" is an easy way to remember the  main warning signs: ? B - Balance. Signs are dizziness, sudden trouble walking, or loss of balance. ? E - Eyes. Signs are trouble seeing or a change in how you see. ? F - Face. Signs are sudden weakness or loss of feeling of the face, or the face or eyelid drooping on one side. ? A - Arms. Signs are weakness or loss of feeling in an arm. This happens suddenly and usually on one side of the body. ? S - Speech. Signs are sudden trouble speaking, slurred speech, or trouble understanding what people say. ? T - Time. Time to call emergency services. Write down what time symptoms started.  You have other signs of a stroke, such as: ? A sudden, very bad headache with no known cause. ? Feeling like you may vomit (nausea). ? Vomiting. ? A seizure. These symptoms may be an emergency. Do not wait to see if the symptoms will go away. Get medical help right away. Call your local emergency services (911 in the [place] Do not drive yourself to the hospital. Summary  The carotid arteries are blood vessels on both sides of the neck.  If these arteries get smaller or get blocked, you are more likely to have a stroke or a mini-stroke.  This condition can be treated with lifestyle changes, medicines, surgery, or a blend of these treatments.  Get help right away if you have any signs of a stroke. "BE FAST" is an easy way to remember the main warning signs of stroke. This information is not intended to replace advice given to you by your health care provider. Make sure you discuss any questions you have with your health care provider. Document Revised: 03/31/2019 Document Reviewed: 03/31/2019 Elsevier Patient Education  [place].   [facility]  Discharge Instructions   Carotid Endarterectomy (CEA)  Please refer to the following instructions for your post-procedure care. Your surgeon or physician assistant will discuss any changes with you.  Activity  You are  encouraged to walk as much as you can. You can slowly return to normal activities but must avoid strenuous activity and heavy lifting until your doctor tell you it's OK. Avoid activities such as vacuuming or swinging a golf club. You can drive after one week if you are comfortable and you are no longer taking prescription pain medications. It is normal to feel tired for serval weeks after your surgery. It is also normal to have difficulty with sleep habits, eating, and bowel movements after surgery. These will go away with time.  Bathing/Showering  You may shower after you come home. Do not soak in a bathtub, hot tub, or swim until the incision heals completely.  Incision Care  Shower every day. Clean your incision with mild soap and water. Pat the area dry with a clean towel. You do not need a bandage unless otherwise instructed. Do not apply any ointments or creams to  your incision. You may have skin glue on your incision. Do not peel it off. It will come off on its own in about one week. Your incision may feel thickened and raised for several weeks after your surgery. This is normal and the skin will soften over time. For Men Only: It's OK to shave around the incision but do not shave the incision itself for 2 weeks. It is common to have numbness under your chin that could last for several months.  Diet  Resume your normal diet. There are no special food restrictions following this procedure. A low fat/low cholesterol diet is recommended for all patients with vascular disease. In order to heal from your surgery, it is CRITICAL to get adequate nutrition. Your body requires vitamins, minerals, and protein. Vegetables are the best source of vitamins and minerals. Vegetables also provide the perfect balance of protein. Processed food has little nutritional value, so try to avoid this.        Medications  Resume taking all of your medications unless your doctor or physician assistant tells you  not to. If your incision is causing pain, you may take over-the- counter pain relievers such as acetaminophen (Tylenol). If you were prescribed a stronger pain medication, please be aware these medications can cause nausea and constipation. Prevent nausea by taking the medication with a snack or meal. Avoid constipation by drinking plenty of fluids and eating foods with a high amount of fiber, such as fruits, vegetables, and grains. Do not take Tylenol if you are taking prescription pain medications.  Follow Up  Our office will schedule a follow up appointment 2-3 weeks following discharge.  Please call us immediately for any of the following conditions  Increased pain, redness, drainage (pus) from your incision site. Fever of 101 degrees or higher. If you should develop stroke (slurred speech, difficulty swallowing, weakness on one side of your body, loss of vision) you should call 911 and go to the nearest emergency room.  Reduce your risk of vascular disease:  Stop smoking. If you would like help call [facility] at 1-800-QUIT-NOW 252-626-7983) or [place] at 6703766274. Manage your cholesterol Maintain a desired weight Control your diabetes Keep your blood pressure down  If you have any questions, please call the office at [phone].

## 2019-10-23 NOTE — TOC Transition Note (Signed)
Transition of Care [name]) - CM/SW Discharge Note   Patient Details  Name: Kendra [name] MRN: [id number] Date of Birth: [birth year]  [facility]) CM/SW Contact:  Zenon Mayo, RN Phone Number: 10/23/2019, 3:04 PM   Clinical Narrative:    Patient is for dc today, previous NCM note states patient is active with [facility] ,  NCM left vm with Tommi Rumps that patient is for dc today needing HHPT, [place].     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

## 2019-10-23 NOTE — Patient Instructions (Signed)
Thank you allowing the Chronic Care Management Team to be a part of your care! It was a pleasure speaking with you today!     CCM (Chronic Care Management) Team    [name] RN, MSN, CCM Nurse Care Coordinator  [phone]   [name] PharmD  Clinical Pharmacist  [phone]   [name] LCSW Clinical Social Worker [phone]  Visit Information  Goals Addressed            This Visit's Progress   . PharmD-Medication Adherence       Current Barriers:  . Financial Barriers . Knowledge deficits related to coordination of her own care . Limited social support   Pharmacist Clinical Goal(s):   [name] Over the next 30 days, patient will demonstrate improved medication adherence as evidenced by verbalized understanding of prescribed medication regimen, assistance available, and patient report of adherence  Interventions: . Perform chart review. Note that patient was discharged from [facility] today. . Follow up call to Ms. Shanahan. Patient states that she has just gotten home. . Encourage patient/family to follow up with [name] for obtaining her medications as changes have been made. Patient states that she will call the pharmacy now to arrange to receive these. . Coordination of care call to [name]. Speak with Marcello Fennel o Review patient's medication list from discharge summary with Sam o Note that compared to medication list prior to this hospital admission (1/28-2/4) patient was to: - STOP lisinopril 10 mg - CHANGE aspirin dose to aspirin 325 mg once daily - START clopidogrel 75 mg once daily o Sam confirms new prescriptions for aspirin and clopidogrel were received by pharmacy and states that pharmacy will start to prepare patient's pill pack for her now and have this delivered by tomorrow. Pharmacy will call to coordinate delivery with patient.  Patient Self Care Activities:  . Patient takes medications as directed with aid of adherence  tools. o Using pill packaging from [facility] . Calls pharmacy for medication refills . Patient to attend scheduled medical appointments . Calls provider office for new concerns or questions  . Patient to check blood sugars and keep log . Patient to check blood pressure daily and keep log  Please see past updates related to this goal by clicking on the "Past Updates" button in the selected goal          The patient verbalized understanding of instructions provided today and declined a print copy of patient instruction materials.   The care management team will reach out to the patient again over the next 7 days.   [name], PharmD, [place] [employer] [phone]

## 2019-10-23 NOTE — Discharge Summary (Signed)
Physician Discharge Summary  Kendra [name] MRN:[id number] DOB: 12/25/1945 DOA: 10/17/2019  PCP: Kennedy, Lauren Renee, NP (Inactive)  Admit date: 10/17/2019 Discharge date: 10/23/2019  Admitted From: home Disposition:  hhpt  Recommendations for Outpatient Follow-up:  1. Follow up with PCP in 1-2 weeks 2. Please obtain BMP/CBC in one week 3. Please follow up on the following pending results:  Home Health:yes  Equipment/Devices: none  Discharge Condition: Stable Code Status: full Diet recommendation: Heart Healthy  Brief/Interim Summary: 73-year-old female with history of hypertension, HLD, T2DM, diabetes, RA, current active smoker, previous CVA with most recent stroke in March 2020 secondary to right M1 occlusion s/p TPA and thrombectomy with residual left hemiparesis presented to ER at  with complaint of worsening weakness on the left side-as patient could not walk, left face was droopy.  ER CT showed chronic appearing ischemia in the right MCA territory, seen by teleneurology, not a candidate for TPA given lack of new disabling deficits on exam and inconsistent AKI Clinical history.  Follow-up CT angios showed right MCA M2 occlusion/critical stenosis, chronic, not a candidate for thrombectomy due to low NIH scale.  Dr. Aroor from neurology was consulted, then transferred to [place] for vascular surgery evaluation Seen by vascular surgery, will to have a symptomatic right ICA stenosis and left a string sign underwent Rt CEA 10/21/19 by Dr Cain.  Postop transferred to ICU. Patient was monitored closely in ICU.  Dopamine was weaned off overnight and early in am. A-line was removed, patient was able to ambulate well with nursing staff and with PT. Blood pressure is lethargic but continued to improve and hemodynamically stable.  Patient is watched without IV fluids-and remains a stable. Patient reports he has ability to pay and is he would like to get discharged today.  Declined to be  monitored overnight.  Discharge Diagnoses:  Worsening left-sided weakness 2/2 right brain TIA likely due to right ICA high-grade stenosis: s/p CEA 10/21/19 by Dr Cain.  Postop transferred to ICU.  Seen by neurology.  Patient completed stroke work-up per neuro who signed off- TTE EF 60 to 65%, carotid Doppler right 40 to 59%, left ICA 67% stenosis, LDL 37, as be A1c 6.2, MRI no acute finding old right MCA infarct, small vessel disease, CT perfusion no core infarct. On ASA 325mg, Plavix 75mg and Lipitor 40 mg.  At this time hemodynamically stable.  Vascular surgery has cleared the patient for discharge.  History of previous strokes-cont home antipltatelets as above.  HLD-cont statins  HTN- blood pressure stable, not on home meds.  Patient has been asymptomatic all morning -has been ambulating without any issues.  I asked her to hold off on her lisinopril and follow-up with PCP for now.  Tobacco use disorder-cessation advised personally  T2DM, controlled, with complications, HBA1C 6.2. cont home meds Discussed with the patient for the need to follow-up with her neurologist in 2 wk  and w/ PCP and for monitoring blood pressure at home.  Consults:  Neurology, vascular surgery.  Subjective: Seen this morning ambulated couple of times without any issues.  Blood pressure at times soft mostly in the morning but has been stabilized above 100.  Patient reports this is her baseline blood pressure. Requesting for discharge today as she has bill to pay tomorrow.  Discharge Exam: Vitals:   10/23/19 1300 10/23/19 1330  BP: (!) 106/54 105/63  Pulse: 64 66  Resp: 15 17  Temp:    SpO2: 99% 97%   General: Pt is   alert, awake, not in acute distress Cardiovascular: RRR, S1/S2 +, no rubs, no gallops Respiratory: CTA bilaterally, no wheezing, no rhonchi Abdominal: Soft, NT, ND, bowel sounds + Extremities: no edema, no cyanosis Neck incision site glued, nontender no drainage or swelling  Discharge  Instructions  Discharge Instructions    Diet Carb Modified   Complete by: As directed    Discharge instructions   Complete by: As directed    Please call call MD or return to ER for similar or worsening recurring problem that brought you to hospital or if any fever,nausea/vomiting,abdominal pain, uncontrolled pain, chest pain,  shortness of breath or any other alarming symptoms.  Please follow-up your doctor as instructed in a week time and call the office for appointment. Follow Up with your doctor at [facility]. You will need to see your neurologist in 2 wks.  Please avoid alcohol, smoking, or any other illicit substance and maintain healthy habits including taking your regular medications as prescribed.  You were cared for by a hospitalist during your hospital stay. If you have any questions about your discharge medications or the care you received while you were in the hospital after you are discharged, you can call the unit and ask to speak with the hospitalist on call if the hospitalist that took care of you is not available.  Once you are discharged, your primary care physician will handle any further medical issues. Please note that NO REFILLS for any discharge medications will be authorized once you are discharged, as it is imperative that you return to your primary care physician (or establish a relationship with a primary care physician if you do not have one) for your aftercare needs so that they can reassess your need for medications and monitor your lab values  Follow-up with neurology and vascular surgery after discharge.   Increase activity slowly   Complete by: As directed      Allergies as of 10/23/2019      Reactions   Citalopram Nausea And Vomiting      Medication List    STOP taking these medications   lisinopril 10 MG tablet Commonly known as: ZESTRIL     TAKE these medications   acetaminophen 500 MG tablet Commonly known as: TYLENOL Take 2 tablets  (1,000 mg total) by mouth every 8 (eight) hours as needed for mild pain or moderate pain.   albuterol 108 (90 Base) MCG/ACT inhaler Commonly known as: Ventolin HFA INHALE 1 TO 2 PUFFS INTO THE LUNGS EVERY 6 HOURS AS NEEDED FOR WHEEZING OR SHORTNESS OF BREATH What changed:   how much to take  how to take this  when to take this  reasons to take this  additional instructions   aspirin 325 MG EC tablet Take 1 tablet (325 mg total) by mouth daily. Start taking on: October 24, 2019 What changed:   medication strength  how much to take  additional instructions   atorvastatin 40 MG tablet Commonly known as: LIPITOR TAKE 1 TABLET BY MOUTH ONCE DAILY   baclofen 10 MG tablet Commonly known as: LIORESAL TAKE 1 TABLET BY MOUTH ONCE DAILY AS NEEDED MUSCLE SPASMS   buPROPion 150 MG 24 hr tablet Commonly known as: WELLBUTRIN XL TAKE 1 TABLET BY MOUTH ONCE DAILY   clopidogrel 75 MG tablet Commonly known as: PLAVIX Take 1 tablet (75 mg total) by mouth daily. Start taking on: October 24, 2019   glucose blood test strip Commonly known as: OneTouch Verio Use as instructed     Insulin Pen Needle 32G X 4 MM Misc Commonly known as: BD Pen Needle Nano U/F USE WITH LANTUS AS DIRECTED   meloxicam 15 MG tablet Commonly known as: MOBIC TAKE 1 TABLET BY MOUTH ONCE DAILY AS NEEDED   metFORMIN 500 MG 24 hr tablet Commonly known as: GLUCOPHAGE-XR Take 1 tablet (500 mg total) by mouth 2 (two) times daily before a meal.   nicotine polacrilex 4 MG lozenge Commonly known as: COMMIT Take 4 mg by mouth as needed for smoking cessation.   ONE TOUCH DELICA LANCING DEV Misc 1 Device by Does not apply route 2 (two) times daily.   OneTouch Delica Lancets 97L Misc 1 Device by Does not apply route 2 (two) times daily.   OneTouch Verio w/Device Kit 1 kit by Does not apply route 2 (two) times daily.   polyethylene glycol powder 17 GM/SCOOP powder Commonly known as: GLYCOLAX/MIRALAX Take  17-34 g by mouth daily as needed for mild constipation or moderate constipation.   Trelegy Ellipta 100-62.5-25 MCG/INH Aepb Generic drug: Fluticasone-Umeclidin-Vilant Inhale 1 puff into the lungs daily.   Vitamin D3 50 MCG (2000 UT) capsule Take 1 capsule (2,000 Units total) by mouth daily.      Follow-up Information    [name], MD In 3 weeks.   Specialties: Vascular Surgery, Cardiology Why: Office will call you to arrange your appt (sent) Contact information: [place] [address] [phone]        [name], NP Follow up in 1 week(s).   Specialty: Nurse Practitioner Contact information: [place] [address] [phone]          Allergies  Allergen Reactions  . Citalopram Nausea And Vomiting    The results of significant diagnostics from this hospitalization (including imaging, microbiology, ancillary and laboratory) are listed below for reference.    Microbiology: Recent Results (from the past 240 hour(s))  Respiratory Panel by RT PCR (Flu A&B, Covid) - Nasopharyngeal Swab     Status: None   Collection Time: 10/16/19  6:36 PM   Specimen: Nasopharyngeal Swab  Result Value Ref Range Status   SARS Coronavirus 2 by RT PCR NEGATIVE NEGATIVE Final    Comment: (NOTE) SARS-CoV-2 target nucleic acids are NOT DETECTED. The SARS-CoV-2 RNA is generally detectable in upper respiratoy specimens during the acute phase of infection. The lowest concentration of SARS-CoV-2 viral copies this assay can detect is 131 copies/mL. A negative result does not preclude SARS-Cov-2 infection and should not be used as the sole basis for treatment or other patient management decisions. A negative result may occur with  improper specimen collection/handling, submission of specimen other than nasopharyngeal swab, presence of viral mutation(s) within the areas targeted by this assay, and inadequate number of viral copies (<131  copies/mL). A negative result must be combined with clinical observations, patient history, and epidemiological information. The expected result is Negative. Fact Sheet for Patients:  PinkCheek.be Fact Sheet for Healthcare Providers:  GravelBags.it This test is not yet ap proved or cleared by the [place] FDA and  has been authorized for detection and/or diagnosis of SARS-CoV-2 by FDA under an Emergency Use Authorization (EUA). This EUA will remain  in effect (meaning this test can be used) for the duration of the COVID-19 declaration under Section 564(b)(1) of the Act, 21 U.S.C. section 360bbb-3(b)(1), unless the authorization is terminated or revoked sooner.    Influenza A by PCR NEGATIVE NEGATIVE Final   Influenza B by PCR NEGATIVE  NEGATIVE Final    Comment: (NOTE) The Xpert Xpress SARS-CoV-2/FLU/RSV assay is intended as an aid in  the diagnosis of influenza from Nasopharyngeal swab specimens and  should not be used as a sole basis for treatment. Nasal washings and  aspirates are unacceptable for Xpert Xpress SARS-CoV-2/FLU/RSV  testing. Fact Sheet for Patients: https://www.fda.gov/media/142436/download Fact Sheet for Healthcare Providers: https://www.fda.gov/media/142435/download This test is not yet approved or cleared by the [place] FDA and  has been authorized for detection and/or diagnosis of SARS-CoV-2 by  FDA under an Emergency Use Authorization (EUA). This EUA will remain  in effect (meaning this test can be used) for the duration of the  Covid-19 declaration under Section 564(b)(1) of the Act, 21  U.S.C. section 360bbb-3(b)(1), unless the authorization is  terminated or revoked. Performed at [facility], [address] [place], [place] [address]   MRSA PCR Screening     Status: None   Collection Time: 10/21/19  4:29 AM   Specimen: Nasal Mucosa; Nasopharyngeal  Result Value Ref Range  Status   MRSA by PCR NEGATIVE NEGATIVE Final    Comment:        The GeneXpert MRSA Assay (FDA approved for NASAL specimens only), is one component of a comprehensive MRSA colonization surveillance program. It is not intended to diagnose MRSA infection nor to guide or monitor treatment for MRSA infections. Performed at [facility], [address] [place], [place] [address]     Procedures/Studies: CT ANGIO HEAD W OR WO CONTRAST  Addendum Date: 10/16/2019   ADDENDUM REPORT: 10/16/2019 19:40 ADDENDUM: Study discussed by telephone with Dr. Paduchowski in the ED on 10/16/2019 at 1933 hours. Electronically Signed   By: H  Hall M.D.   On: 10/16/2019 19:40   Result Date: 10/16/2019 CLINICAL DATA:  73-year-old female code stroke presentation with left side weakness since 1530 or 1600 hours. EXAM: CT ANGIOGRAPHY HEAD AND NECK CT PERFUSION BRAIN TECHNIQUE: Multidetector CT imaging of the head and neck was performed using the standard protocol during bolus administration of intravenous contrast. Multiplanar CT image reconstructions and MIPs were obtained to evaluate the vascular anatomy. Carotid stenosis measurements (when applicable) are obtained utilizing NASCET criteria, using the distal internal carotid diameter as the denominator. Multiphase CT imaging of the brain was performed following IV bolus contrast injection. Subsequent parametric perfusion maps were calculated using RAPID software. CONTRAST:  100mL OMNIPAQUE IOHEXOL 350 MG/ML SOLN COMPARISON:  Plain head CT 1724 hours today. FINDINGS: CT Brain Perfusion Findings: ASPECTS: 10, presumed chronic right MCA encephalomalacia. CBF (<30%) Volume: None, CBV appears increased in the area of abnormal T-max. Perfusion (Tmax>6.0s) volume: 10mL Mismatch Volume: 10mL Infarction Location:No infarct core, but ischemia localizes to the right frontal operculum and posterior right MCA territory. CT HEAD Brain: Repeat plain head CT images at 1847 hours.  Abnormal cortical and white matter hypodensity in the posterior and middle right MCA territory appears stable from earlier. No associated hemorrhage or mass effect. As before there seems to be ex vacuo enlargement of the right lateral ventricle. Calvarium and skull base: Stable, negative. Paranasal sinuses: Visualized paranasal sinuses and mastoids are stable and well pneumatized. Orbits: No acute findings. CTA NECK Skeleton: Absent dentition. Prior cervical ACDF with solid arthrodesis. Advanced cervical spine degeneration elsewhere. No acute osseous abnormality identified. Upper chest: Centrilobular and to a lesser extent paraseptal emphysema. Calcified aortic atherosclerosis. No superior mediastinal lymphadenopathy. Other neck: No acute findings. Aortic arch: 3 vessel arch configuration. Mild great vessel origin atherosclerosis. Right carotid   system: Brachiocephalic artery plaque without stenosis. Normal right CCA origin. Tortuous proximal right CCA with a kinked appearance at the thoracic inlet on series 508, image 119. Heavily calcified right ECA and ICA origins with additional posterior right ICA origin soft plaque. Right ICA origin stenosis is 60-65 % with respect to the distal vessel. See series 508, image 101 and series 512, image 49. The right ICA remains patent to the skull base. Left carotid system: Negative left CCA origin. Tortuous proximal left CCA. Heavily calcified left carotid bifurcation and right ICA origin. RADIOGRAPHIC STRING SIGN STENOSIS just proximal to the bifurcation on series 512, image 98. Less than 50% additional proximal right ICA stenosis. The right ICA remains patent to the skull base. Vertebral arteries: Mild proximal right subclavian calcified plaque without stenosis. Normal right vertebral artery origin. Right vertebral artery is patent to the skull base without plaque or stenosis. Normal proximal left subclavian artery. Normal left vertebral artery origin. Tortuous left V1  segment. The left vertebral is mildly non dominant and patent to the skull base without stenosis. CTA HEAD Posterior circulation: Mild bilateral V4 calcified plaque without stenosis. The right V4 segment is mildly dominant. Patent PICA origins and vertebrobasilar junction without stenosis. Patent basilar artery without stenosis. Normal SCA and PCA origins. Posterior communicating arteries are diminutive or absent. Bilateral PCA branches are within normal limits. Anterior circulation: Both ICA siphons are patent. On the left there is moderate calcified plaque and moderate stenosis just distal to the anterior genu (series 509, image 225. Normal left ophthalmic artery origin. On the right moderate to severe calcified plaque most pronounced in the cavernous segment with moderate stenosis at the petrous and cavernous junction. Normal right ophthalmic artery origin. Patent carotid termini with normal MCA and ACA origins. Anterior communicating artery and bilateral ACA branches are within normal limits. Left MCA M1 segment and trifurcation are patent without stenosis. The left MCA trifurcation is ectatic but there is no discrete saccular aneurysm. Left MCA branches are within normal limits. Right MCA M1 segment and bifurcation are patent without stenosis. In the dominant middle M2 division there is a high-grade stenosis or short segment occlusion best demonstrated on series 515, image 8 with preserved distal enhancement although the downstream middle MCA branches are highly diminutive. Venous sinuses: Patent. Anatomic variants: Dominant right vertebral artery. Review of the MIP images confirms the above findings IMPRESSION: 1. Positive for Right MCA M2 middle division near-occlusion/critical stenosis (series 515, image 8). The MCA branches distal to the this are attenuated. However, CTP detects no infarct core and only 10 mL of Right MCA territory penumbra. Plus the plain CT appearance is that of chronic right MCA  ischemia. Therefore, suspect this constellation reflects chronic more so than acute Right MCA vessel disease. 2. And furthermore, there are bilateral cervical ICA and ICA siphon hemodynamically significant stenoses: - Right ICA origin 60-65% stenosis. - Left carotid bifurcation RADIOGRAPHIC STRING SIGN STENOSIS. - Moderate bilateral ICA siphon stenosis due to calcified plaque. 3. Negative vertebral and posterior circulation. 4. Aortic Atherosclerosis (ICD10-I70.0) and Emphysema (ICD10-J43.9). Electronically Signed: By: Genevie Ann M.D. On: 10/16/2019 19:26   CT ANGIO NECK W OR WO CONTRAST  Addendum Date: 10/16/2019   ADDENDUM REPORT: 10/16/2019 19:40 ADDENDUM: Study discussed by telephone with Dr. Kerman Passey in the ED on 10/16/2019 at 1933 hours. Electronically Signed   By: Genevie Ann M.D.   On: 10/16/2019 19:40   Result Date: 10/16/2019 CLINICAL DATA:  74 year old female code stroke presentation with left side weakness  since 1530 or 1600 hours. EXAM: CT ANGIOGRAPHY HEAD AND NECK CT PERFUSION BRAIN TECHNIQUE: Multidetector CT imaging of the head and neck was performed using the standard protocol during bolus administration of intravenous contrast. Multiplanar CT image reconstructions and MIPs were obtained to evaluate the vascular anatomy. Carotid stenosis measurements (when applicable) are obtained utilizing NASCET criteria, using the distal internal carotid diameter as the denominator. Multiphase CT imaging of the brain was performed following IV bolus contrast injection. Subsequent parametric perfusion maps were calculated using RAPID software. CONTRAST:  170m OMNIPAQUE IOHEXOL 350 MG/ML SOLN COMPARISON:  Plain head CT 1724 hours today. FINDINGS: CT Brain Perfusion Findings: ASPECTS: 10, presumed chronic right MCA encephalomalacia. CBF (<30%) Volume: None, CBV appears increased in the area of abnormal T-max. Perfusion (Tmax>6.0s) volume: 120mMismatch Volume: 1020mnfarction Location:No infarct core, but ischemia  localizes to the right frontal operculum and posterior right MCA territory. CT HEAD Brain: Repeat plain head CT images at 1847 hours. Abnormal cortical and white matter hypodensity in the posterior and middle right MCA territory appears stable from earlier. No associated hemorrhage or mass effect. As before there seems to be ex vacuo enlargement of the right lateral ventricle. Calvarium and skull base: Stable, negative. Paranasal sinuses: Visualized paranasal sinuses and mastoids are stable and well pneumatized. Orbits: No acute findings. CTA NECK Skeleton: Absent dentition. Prior cervical ACDF with solid arthrodesis. Advanced cervical spine degeneration elsewhere. No acute osseous abnormality identified. Upper chest: Centrilobular and to a lesser extent paraseptal emphysema. Calcified aortic atherosclerosis. No superior mediastinal lymphadenopathy. Other neck: No acute findings. Aortic arch: 3 vessel arch configuration. Mild great vessel origin atherosclerosis. Right carotid system: Brachiocephalic artery plaque without stenosis. Normal right CCA origin. Tortuous proximal right CCA with a kinked appearance at the thoracic inlet on series 508, image 119. Heavily calcified right ECA and ICA origins with additional posterior right ICA origin soft plaque. Right ICA origin stenosis is 60-65 % with respect to the distal vessel. See series 508, image 101 and series 512, image 49. The right ICA remains patent to the skull base. Left carotid system: Negative left CCA origin. Tortuous proximal left CCA. Heavily calcified left carotid bifurcation and right ICA origin. RADIOGRAPHIC STRING SIGN STENOSIS just proximal to the bifurcation on series 512, image 98. Less than 50% additional proximal right ICA stenosis. The right ICA remains patent to the skull base. Vertebral arteries: Mild proximal right subclavian calcified plaque without stenosis. Normal right vertebral artery origin. Right vertebral artery is patent to the skull  base without plaque or stenosis. Normal proximal left subclavian artery. Normal left vertebral artery origin. Tortuous left V1 segment. The left vertebral is mildly non dominant and patent to the skull base without stenosis. CTA HEAD Posterior circulation: Mild bilateral V4 calcified plaque without stenosis. The right V4 segment is mildly dominant. Patent PICA origins and vertebrobasilar junction without stenosis. Patent basilar artery without stenosis. Normal SCA and PCA origins. Posterior communicating arteries are diminutive or absent. Bilateral PCA branches are within normal limits. Anterior circulation: Both ICA siphons are patent. On the left there is moderate calcified plaque and moderate stenosis just distal to the anterior genu (series 509, image 225. Normal left ophthalmic artery origin. On the right moderate to severe calcified plaque most pronounced in the cavernous segment with moderate stenosis at the petrous and cavernous junction. Normal right ophthalmic artery origin. Patent carotid termini with normal MCA and ACA origins. Anterior communicating artery and bilateral ACA branches are within normal limits. Left MCA M1 segment  and trifurcation are patent without stenosis. The left MCA trifurcation is ectatic but there is no discrete saccular aneurysm. Left MCA branches are within normal limits. Right MCA M1 segment and bifurcation are patent without stenosis. In the dominant middle M2 division there is a high-grade stenosis or short segment occlusion best demonstrated on series 515, image 8 with preserved distal enhancement although the downstream middle MCA branches are highly diminutive. Venous sinuses: Patent. Anatomic variants: Dominant right vertebral artery. Review of the MIP images confirms the above findings IMPRESSION: 1. Positive for Right MCA M2 middle division near-occlusion/critical stenosis (series 515, image 8). The MCA branches distal to the this are attenuated. However, CTP detects no  infarct core and only 10 mL of Right MCA territory penumbra. Plus the plain CT appearance is that of chronic right MCA ischemia. Therefore, suspect this constellation reflects chronic more so than acute Right MCA vessel disease. 2. And furthermore, there are bilateral cervical ICA and ICA siphon hemodynamically significant stenoses: - Right ICA origin 60-65% stenosis. - Left carotid bifurcation RADIOGRAPHIC STRING SIGN STENOSIS. - Moderate bilateral ICA siphon stenosis due to calcified plaque. 3. Negative vertebral and posterior circulation. 4. Aortic Atherosclerosis (ICD10-I70.0) and Emphysema (ICD10-J43.9). Electronically Signed: By: H  Hall M.D. On: 10/16/2019 19:26   MR BRAIN WO CONTRAST  Result Date: 10/17/2019 CLINICAL DATA:  Stroke presentation with left-sided weakness. EXAM: MRI HEAD WITHOUT CONTRAST TECHNIQUE: Multiplanar, multiecho pulse sequences of the brain and surrounding structures were obtained without intravenous contrast. COMPARISON:  CT and CTA yesterday. FINDINGS: Brain: No acute infarction, hemorrhage, hydrocephalus, extra-axial collection or mass lesion. Large area of remote right MCA territory infarct affecting cortex and white matter. The right frontal lobe, parietal lobe, insula, anterior temporal lobe are all affected. Chronic small vessel ischemia in the cerebral white matter and pons, moderate to extensive in the pons. Cerebral volume loss without specific pattern. No chronic blood products. Vascular: Normal flow voids.  CT was performed yesterday. Skull and upper cervical spine: Normal marrow signal Sinuses/Orbits: Negative IMPRESSION: 1. No acute finding. 2. Large remote right MCA distribution infarct. 3. Chronic small vessel ischemia most notably affecting the pons. Electronically Signed   By: Jonathon  Watts M.D.   On: 10/17/2019 08:23   CT CEREBRAL PERFUSION W CONTRAST  Addendum Date: 10/16/2019   ADDENDUM REPORT: 10/16/2019 19:40 ADDENDUM: Study discussed by telephone with  Dr. Paduchowski in the ED on 10/16/2019 at 1933 hours. Electronically Signed   By: H  Hall M.D.   On: 10/16/2019 19:40   Result Date: 10/16/2019 CLINICAL DATA:  73-year-old female code stroke presentation with left side weakness since 1530 or 1600 hours. EXAM: CT ANGIOGRAPHY HEAD AND NECK CT PERFUSION BRAIN TECHNIQUE: Multidetector CT imaging of the head and neck was performed using the standard protocol during bolus administration of intravenous contrast. Multiplanar CT image reconstructions and MIPs were obtained to evaluate the vascular anatomy. Carotid stenosis measurements (when applicable) are obtained utilizing NASCET criteria, using the distal internal carotid diameter as the denominator. Multiphase CT imaging of the brain was performed following IV bolus contrast injection. Subsequent parametric perfusion maps were calculated using RAPID software. CONTRAST:  100mL OMNIPAQUE IOHEXOL 350 MG/ML SOLN COMPARISON:  Plain head CT 1724 hours today. FINDINGS: CT Brain Perfusion Findings: ASPECTS: 10, presumed chronic right MCA encephalomalacia. CBF (<30%) Volume: None, CBV appears increased in the area of abnormal T-max. Perfusion (Tmax>6.0s) volume: 10mL Mismatch Volume: 10mL Infarction Location:No infarct core, but ischemia localizes to the right frontal operculum and posterior right MCA territory.   CT HEAD Brain: Repeat plain head CT images at 1847 hours. Abnormal cortical and white matter hypodensity in the posterior and middle right MCA territory appears stable from earlier. No associated hemorrhage or mass effect. As before there seems to be ex vacuo enlargement of the right lateral ventricle. Calvarium and skull base: Stable, negative. Paranasal sinuses: Visualized paranasal sinuses and mastoids are stable and well pneumatized. Orbits: No acute findings. CTA NECK Skeleton: Absent dentition. Prior cervical ACDF with solid arthrodesis. Advanced cervical spine degeneration elsewhere. No acute osseous abnormality  identified. Upper chest: Centrilobular and to a lesser extent paraseptal emphysema. Calcified aortic atherosclerosis. No superior mediastinal lymphadenopathy. Other neck: No acute findings. Aortic arch: 3 vessel arch configuration. Mild great vessel origin atherosclerosis. Right carotid system: Brachiocephalic artery plaque without stenosis. Normal right CCA origin. Tortuous proximal right CCA with a kinked appearance at the thoracic inlet on series 508, image 119. Heavily calcified right ECA and ICA origins with additional posterior right ICA origin soft plaque. Right ICA origin stenosis is 60-65 % with respect to the distal vessel. See series 508, image 101 and series 512, image 49. The right ICA remains patent to the skull base. Left carotid system: Negative left CCA origin. Tortuous proximal left CCA. Heavily calcified left carotid bifurcation and right ICA origin. RADIOGRAPHIC STRING SIGN STENOSIS just proximal to the bifurcation on series 512, image 98. Less than 50% additional proximal right ICA stenosis. The right ICA remains patent to the skull base. Vertebral arteries: Mild proximal right subclavian calcified plaque without stenosis. Normal right vertebral artery origin. Right vertebral artery is patent to the skull base without plaque or stenosis. Normal proximal left subclavian artery. Normal left vertebral artery origin. Tortuous left V1 segment. The left vertebral is mildly non dominant and patent to the skull base without stenosis. CTA HEAD Posterior circulation: Mild bilateral V4 calcified plaque without stenosis. The right V4 segment is mildly dominant. Patent PICA origins and vertebrobasilar junction without stenosis. Patent basilar artery without stenosis. Normal SCA and PCA origins. Posterior communicating arteries are diminutive or absent. Bilateral PCA branches are within normal limits. Anterior circulation: Both ICA siphons are patent. On the left there is moderate calcified plaque and  moderate stenosis just distal to the anterior genu (series 509, image 225. Normal left ophthalmic artery origin. On the right moderate to severe calcified plaque most pronounced in the cavernous segment with moderate stenosis at the petrous and cavernous junction. Normal right ophthalmic artery origin. Patent carotid termini with normal MCA and ACA origins. Anterior communicating artery and bilateral ACA branches are within normal limits. Left MCA M1 segment and trifurcation are patent without stenosis. The left MCA trifurcation is ectatic but there is no discrete saccular aneurysm. Left MCA branches are within normal limits. Right MCA M1 segment and bifurcation are patent without stenosis. In the dominant middle M2 division there is a high-grade stenosis or short segment occlusion best demonstrated on series 515, image 8 with preserved distal enhancement although the downstream middle MCA branches are highly diminutive. Venous sinuses: Patent. Anatomic variants: Dominant right vertebral artery. Review of the MIP images confirms the above findings IMPRESSION: 1. Positive for Right MCA M2 middle division near-occlusion/critical stenosis (series 515, image 8). The MCA branches distal to the this are attenuated. However, CTP detects no infarct core and only 10 mL of Right MCA territory penumbra. Plus the plain CT appearance is that of chronic right MCA ischemia. Therefore, suspect this constellation reflects chronic more so than acute Right MCA vessel disease.   2. And furthermore, there are bilateral cervical ICA and ICA siphon hemodynamically significant stenoses: - Right ICA origin 60-65% stenosis. - Left carotid bifurcation RADIOGRAPHIC STRING SIGN STENOSIS. - Moderate bilateral ICA siphon stenosis due to calcified plaque. 3. Negative vertebral and posterior circulation. 4. Aortic Atherosclerosis (ICD10-I70.0) and Emphysema (ICD10-J43.9). Electronically Signed: By: H  Hall M.D. On: 10/16/2019 19:26   ECHOCARDIOGRAM  COMPLETE  Result Date: 10/17/2019   ECHOCARDIOGRAM REPORT   Patient Name:   Kendra [name] Date of Exam: 10/17/2019 Medical Rec #:  [id number]         Height:       64.0 in Accession #:    [id number]        Weight:       125.2 lb Date of Birth:  04/04/1946          BSA:          1.60 m Patient Age:    73 years          BP:           125/80 mmHg Patient Gender: F                 HR:           64 bpm. Exam Location:  Inpatient Procedure: 2D Echo, Cardiac Doppler and Color Doppler Indications:    Stroke  History:        Patient has no prior history of Echocardiogram examinations.                 COPD and Stroke; Risk Factors:Dyslipidemia, Current Smoker and                 Diabetes.  Sonographer:    Tina West RDCS Referring Phys: [id number] VASUNDHRA RATHORE IMPRESSIONS  1. Left ventricular ejection fraction, by visual estimation, is 60 to 65%. The left ventricle has normal function. There is mildly increased left ventricular hypertrophy.  2. The left ventricle has no regional wall motion abnormalities.  3. Global right ventricle has normal systolic function.The right ventricular size is normal. No increase in right ventricular wall thickness.  4. Left atrial size was normal.  5. Right atrial size was normal.  6. The mitral valve is normal in structure. Mild mitral valve regurgitation. No evidence of mitral stenosis.  7. The tricuspid valve is normal in structure.  8. The tricuspid valve is normal in structure. Tricuspid valve regurgitation is trivial.  9. The aortic valve is normal in structure. Aortic valve regurgitation is not visualized. Mild aortic valve sclerosis without stenosis. 10. The pulmonic valve was normal in structure. Pulmonic valve regurgitation is trivial. 11. Normal pulmonary artery systolic pressure. 12. The inferior vena cava is normal in size with greater than 50% respiratory variability, suggesting right atrial pressure of 3 mmHg. FINDINGS  Left Ventricle: Left ventricular ejection fraction, by  visual estimation, is 60 to 65%. The left ventricle has normal function. The left ventricle has no regional wall motion abnormalities. There is mildly increased left ventricular hypertrophy. Concentric left ventricular hypertrophy. Left ventricular diastolic parameters were normal. Normal left atrial pressure. Right Ventricle: The right ventricular size is normal. No increase in right ventricular wall thickness. Global RV systolic function is has normal systolic function. The tricuspid regurgitant velocity is 2.44 m/s, and with an assumed right atrial pressure  of 3 mmHg, the estimated right ventricular systolic pressure is normal at 26.7 mmHg. Left Atrium: Left atrial size was normal in size. Right Atrium: Right atrial   size was normal in size Prominent Eustachian valve. Pericardium: There is no evidence of pericardial effusion. Mitral Valve: The mitral valve is normal in structure. Mild mitral valve regurgitation, with centrally-directed jet. No evidence of mitral valve stenosis by observation. Tricuspid Valve: The tricuspid valve is normal in structure. Tricuspid valve regurgitation is trivial. Aortic Valve: The aortic valve is normal in structure. Aortic valve regurgitation is not visualized. Mild aortic valve sclerosis is present, with no evidence of aortic valve stenosis. Pulmonic Valve: The pulmonic valve was normal in structure. Pulmonic valve regurgitation is trivial. Pulmonic regurgitation is trivial. Aorta: The aortic root, ascending aorta and aortic arch are all structurally normal, with no evidence of dilitation or obstruction. Venous: The inferior vena cava is normal in size with greater than 50% respiratory variability, suggesting right atrial pressure of 3 mmHg. IAS/Shunts: No atrial level shunt detected by color flow Doppler. There is no evidence of a patent foramen ovale. No ventricular septal defect is seen or detected. There is no evidence of an atrial septal defect.  LEFT VENTRICLE PLAX 2D LVIDd:          3.67 cm       Diastology LVIDs:         2.14 cm       LV e' lateral:   8.48 cm/s LV PW:         1.29 cm       LV E/e' lateral: 12.7 LV IVS:        1.20 cm       LV e' medial:    8.59 cm/s LVOT diam:     1.90 cm       LV E/e' medial:  12.6 LV SV:         42 ml LV SV Index:   26.13 LVOT Area:     2.84 cm  LV Volumes (MOD) LV area d, A2C:    21.70 cm LV area d, A4C:    20.30 cm LV area s, A2C:    10.10 cm LV area s, A4C:    11.60 cm LV major d, A2C:   6.79 cm LV major d, A4C:   6.09 cm LV major s, A2C:   5.11 cm LV major s, A4C:   5.12 cm LV vol d, MOD A2C: 57.5 ml LV vol d, MOD A4C: 55.0 ml LV vol s, MOD A2C: 16.8 ml LV vol s, MOD A4C: 22.1 ml LV SV MOD A2C:     40.7 ml LV SV MOD A4C:     55.0 ml LV SV MOD BP:      40.1 ml RIGHT VENTRICLE             IVC RV S prime:     10.89 cm/s  IVC diam: 1.41 cm TAPSE (M-mode): 2.9 cm LEFT ATRIUM             Index       RIGHT ATRIUM           Index LA diam:        2.90 cm 1.81 cm/m  RA Area:     12.90 cm LA Vol (A2C):   40.7 ml 25.38 ml/m RA Volume:   30.90 ml  19.27 ml/m LA Vol (A4C):   37.5 ml 23.39 ml/m LA Biplane Vol: 42.2 ml 26.32 ml/m  AORTIC VALVE LVOT Vmax:   103.00 cm/s LVOT Vmean:  58.400 cm/s LVOT VTI:    0.199 m  AORTA Ao Root diam: 2.70 cm  Ao Asc diam:  3.40 cm MITRAL VALVE                         TRICUSPID VALVE MV Area (PHT): 4.49 cm              TR Peak grad:   23.7 mmHg MV PHT:        49.01 msec            TR Vmax:        264.00 cm/s MV Decel Time: 169 msec MV E velocity: 108.00 cm/s 103 cm/s  SHUNTS MV A velocity: 113.00 cm/s 70.3 cm/s Systemic VTI:  0.20 m MV E/A ratio:  0.96        1.5       Systemic Diam: 1.90 cm  Dani Gobble Croitoru MD Electronically signed by Sanda Klein MD Signature Date/Time: 10/17/2019/5:29:41 PM    Final    CT HEAD CODE STROKE WO CONTRAST  Addendum Date: 10/16/2019   ADDENDUM REPORT: 10/16/2019 17:49 ADDENDUM: Study discussed by telephone with Dr. Kerman Passey on 10/16/2019 at 1743 hours. Electronically Signed   By: Genevie Ann M.D.   On: 10/16/2019 17:49   Result Date: 10/16/2019 CLINICAL DATA:  Code stroke. 74 year old female with left side weakness since around 15 30 or 1600 hours today. EXAM: CT HEAD WITHOUT CONTRAST TECHNIQUE: Contiguous axial images were obtained from the base of the skull through the vertex without intravenous contrast. COMPARISON:  Head CT 04/24/2015. Brain MRI 09/02/2014. FINDINGS: Brain: Confluent new right frontal lobe white matter hypodensity, but associated with ex vacuo enlargement of the right lateral ventricle. There is new chronic appearing encephalomalacia of the right insula and operculum. Progressed cerebral white matter and deep gray matter hypodensity elsewhere in both hemispheres since 2016. No acute intracranial hemorrhage identified. No midline shift, mass effect, or evidence of intracranial mass lesion. Vascular: Calcified atherosclerosis at the skull base. No suspicious intracranial vascular hyperdensity. Skull: No acute osseous abnormality identified. Sinuses/Orbits: Visualized paranasal sinuses and mastoids are stable and well pneumatized. Other: No acute orbit or scalp soft tissue findings. ASPECTS [facility] Stroke Program Early CT Score) Total score (0-10 with 10 being normal): 10 (presuming chronic encephalomalacia in the right frontal lobe). IMPRESSION: 1. New since 2016 but chronic appearing ischemia in the right MCA territory. Underlying advanced chronic small vessel disease. 2. No or definite acute cortically based infarct. No acute intracranial hemorrhage. ASPECTS 10. Electronically Signed: By: Genevie Ann M.D. On: 10/16/2019 17:38   VAS US CAROTID  Result Date: 10/18/2019 Carotid Arterial Duplex Study Indications:       CVA. Risk Factors:      Hyperlipidemia, Diabetes. Comparison Study:  CTA done 10-16-19. Performing Technologist: Baldwin Crown ARDMS, RVT  Examination Guidelines: A complete evaluation includes B-mode imaging, spectral Doppler, color Doppler, and power Doppler  as needed of all accessible portions of each vessel. Bilateral testing is considered an integral part of a complete examination. Limited examinations for reoccurring indications may be performed as noted.  Right Carotid Findings: +----------+--------+--------+--------+-------------------------+---------+           PSV cm/sEDV cm/sStenosisPlaque Description       Comments  +----------+--------+--------+--------+-------------------------+---------+ CCA Prox  55      13                                                 +----------+--------+--------+--------+-------------------------+---------+  CCA Distal49      14              heterogenous                       +----------+--------+--------+--------+-------------------------+---------+ ICA Prox  120     35      40-59%  heterogenous and calcificShadowing +----------+--------+--------+--------+-------------------------+---------+ ICA Distal114     25                                                 +----------+--------+--------+--------+-------------------------+---------+ ECA       142     14                                                 +----------+--------+--------+--------+-------------------------+---------+ +----------+--------+-------+----------------+-------------------+           PSV cm/sEDV cmsDescribe        Arm Pressure (mmHG) +----------+--------+-------+----------------+-------------------+ [id number]            Multiphasic, WNL                    +----------+--------+-------+----------------+-------------------+ +---------+--------+--+--------+--+---------+ VertebralPSV cm/s65EDV cm/s17Antegrade +---------+--------+--+--------+--+---------+  Left Carotid Findings: +----------+--------+--------+--------+------------------+------------------+           PSV cm/sEDV cm/sStenosisPlaque DescriptionComments           +----------+--------+--------+--------+------------------+------------------+  CCA Prox  65      20                                                   +----------+--------+--------+--------+------------------+------------------+ CCA Distal50      19                                intimal thickening +----------+--------+--------+--------+------------------+------------------+ ICA Prox  305     40      60-79%  calcific          Shadowing          +----------+--------+--------+--------+------------------+------------------+ ICA Mid   138     23                                                   +----------+--------+--------+--------+------------------+------------------+ ICA Distal67      19                                                   +----------+--------+--------+--------+------------------+------------------+ ECA       91      12                                                   +----------+--------+--------+--------+------------------+------------------+ +----------+--------+--------+----------------+-------------------+  PSV cm/sEDV cm/sDescribe        Arm Pressure (mmHG) +----------+--------+--------+----------------+-------------------+ Subclavian150             Multiphasic, WNL                    +----------+--------+--------+----------------+-------------------+ +---------+--------+--+--------+--+---------+ VertebralPSV cm/s78EDV cm/s18Antegrade +---------+--------+--+--------+--+---------+   Summary: Right Carotid: Velocities in the right ICA are consistent with a 40-59%                stenosis. Left Carotid: Velocities in the left ICA are consistent with a 60-79% stenosis. Vertebrals:  Bilateral vertebral arteries demonstrate antegrade flow. Subclavians: Normal flow hemodynamics were seen in bilateral subclavian              arteries. *See table(s) above for measurements and observations.  Electronically signed by Brandon Cain MD on 10/18/2019 at 8:42:07 AM.    Final     Labs: BNP (last 3 results) No results  for input(s): BNP in the last 8760 hours. Basic Metabolic Panel: Recent Labs  Lab 10/16/19 1717 10/21/19 0517 10/22/19 0451  NA 141 141 139  K 3.7 3.8 3.7  CL 106 109 108  CO2 25 24 23  GLUCOSE 102* 103* 165*  BUN 14 13 8  CREATININE 0.78 0.80 0.73  CALCIUM 9.6 9.1 9.0  MG  --   --  1.7   Liver Function Tests: Recent Labs  Lab 10/16/19 1717  AST 28  ALT 26  ALKPHOS 65  BILITOT 0.6  PROT 7.2  ALBUMIN 4.0   No results for input(s): LIPASE, AMYLASE in the last 168 hours. No results for input(s): AMMONIA in the last 168 hours. CBC: Recent Labs  Lab 10/16/19 1717 10/21/19 0517 10/22/19 0451  WBC 6.3 5.0 6.4  NEUTROABS 3.6  --   --   HGB 11.4* 11.1* 10.8*  HCT 35.2* 32.3* 31.9*  MCV 93.9 90.7 93.0  PLT 230 202 259   Cardiac Enzymes: No results for input(s): CKTOTAL, CKMB, CKMBINDEX, TROPONINI in the last 168 hours. BNP: Invalid input(s): POCBNP CBG: Recent Labs  Lab 10/22/19 1534 10/22/19 1941 10/22/19 2340 10/23/19 0350 10/23/19 1139  GLUCAP 141* 141* 85 75 97   D-Dimer No results for input(s): DDIMER in the last 72 hours. Hgb A1c No results for input(s): HGBA1C in the last 72 hours. Lipid Profile No results for input(s): CHOL, HDL, LDLCALC, TRIG, CHOLHDL, LDLDIRECT in the last 72 hours. Thyroid function studies No results for input(s): TSH, T4TOTAL, T3FREE, THYROIDAB in the last 72 hours.  Invalid input(s): FREET3 Anemia work up No results for input(s): VITAMINB12, FOLATE, FERRITIN, TIBC, IRON, RETICCTPCT in the last 72 hours. Urinalysis    Component Value Date/Time   COLORURINE YELLOW 10/17/2019 1301   APPEARANCEUR CLEAR 10/17/2019 1301   APPEARANCEUR Clear 09/01/2014 2259   LABSPEC 1.012 10/17/2019 1301   LABSPEC 1.027 09/01/2014 2259   PHURINE 5.0 10/17/2019 1301   GLUCOSEU NEGATIVE 10/17/2019 1301   GLUCOSEU >=500 09/01/2014 2259   HGBUR NEGATIVE 10/17/2019 1301   BILIRUBINUR NEGATIVE 10/17/2019 1301   BILIRUBINUR Negative 09/01/2014  2259   KETONESUR NEGATIVE 10/17/2019 1301   PROTEINUR NEGATIVE 10/17/2019 1301   NITRITE NEGATIVE 10/17/2019 1301   LEUKOCYTESUR NEGATIVE 10/17/2019 1301   LEUKOCYTESUR Negative 09/01/2014 2259   Sepsis Labs Invalid input(s): PROCALCITONIN,  WBC,  LACTICIDVEN Microbiology Recent Results (from the past 240 hour(s))  Respiratory Panel by RT PCR (Flu A&B, Covid) - Nasopharyngeal Swab     Status: None   Collection Time: 10/16/19    6:36 PM   Specimen: Nasopharyngeal Swab  Result Value Ref Range Status   SARS Coronavirus 2 by RT PCR NEGATIVE NEGATIVE Final    Comment: (NOTE) SARS-CoV-2 target nucleic acids are NOT DETECTED. The SARS-CoV-2 RNA is generally detectable in upper respiratoy specimens during the acute phase of infection. The lowest concentration of SARS-CoV-2 viral copies this assay can detect is 131 copies/mL. A negative result does not preclude SARS-Cov-2 infection and should not be used as the sole basis for treatment or other patient management decisions. A negative result may occur with  improper specimen collection/handling, submission of specimen other than nasopharyngeal swab, presence of viral mutation(s) within the areas targeted by this assay, and inadequate number of viral copies (<131 copies/mL). A negative result must be combined with clinical observations, patient history, and epidemiological information. The expected result is Negative. Fact Sheet for Patients:  PinkCheek.be Fact Sheet for Healthcare Providers:  GravelBags.it This test is not yet ap proved or cleared by the [place] FDA and  has been authorized for detection and/or diagnosis of SARS-CoV-2 by FDA under an Emergency Use Authorization (EUA). This EUA will remain  in effect (meaning this test can be used) for the duration of the COVID-19 declaration under Section 564(b)(1) of the Act, 21 U.S.C. section 360bbb-3(b)(1), unless the  authorization is terminated or revoked sooner.    Influenza A by PCR NEGATIVE NEGATIVE Final   Influenza B by PCR NEGATIVE NEGATIVE Final    Comment: (NOTE) The Xpert Xpress SARS-CoV-2/FLU/RSV assay is intended as an aid in  the diagnosis of influenza from Nasopharyngeal swab specimens and  should not be used as a sole basis for treatment. Nasal washings and  aspirates are unacceptable for Xpert Xpress SARS-CoV-2/FLU/RSV  testing. Fact Sheet for Patients: PinkCheek.be Fact Sheet for Healthcare Providers: GravelBags.it This test is not yet approved or cleared by the [place] FDA and  has been authorized for detection and/or diagnosis of SARS-CoV-2 by  FDA under an Emergency Use Authorization (EUA). This EUA will remain  in effect (meaning this test can be used) for the duration of the  Covid-19 declaration under Section 564(b)(1) of the Act, 21  U.S.C. section 360bbb-3(b)(1), unless the authorization is  terminated or revoked. Performed at [facility], [address] [place], [place] [address]   MRSA PCR Screening     Status: None   Collection Time: 10/21/19  4:29 AM   Specimen: Nasal Mucosa; Nasopharyngeal  Result Value Ref Range Status   MRSA by PCR NEGATIVE NEGATIVE Final    Comment:        The GeneXpert MRSA Assay (FDA approved for NASAL specimens only), is one component of a comprehensive MRSA colonization surveillance program. It is not intended to diagnose MRSA infection nor to guide or monitor treatment for MRSA infections. Performed at [facility], [place] [address] [place], [place] [address]      Time coordinating discharge: 25 minutes  SIGNED: Antonieta Pert, MD  [facility] 10/23/2019, 2:20 PM  If 7PM-7AM, please contact night-coverage [email or web address]

## 2019-10-23 NOTE — Progress Notes (Addendum)
Occupational Therapy Treatment Patient Details Name: MAANYA OVALLE MRN: [id number] DOB: 1946/09/18 Today's Date: 10/23/2019    History of present illness KYIA BRANIFF is a 74 y.o. female with PMH of hypertension, hyperlipidemia, DM2, prior CVA with most recent stroke in March 2020 secondary to right M1 occlusion status post IV TPA and thrombectomy who presented with worsening weakness on the left side. MRI negative for acute finding. CTA showing occluded right M2 and severely stenosed left ICA. Pt not a candidate for mechnical thrombectomy as M2 occlusion/stenosis may be chronic. S/P R carotid endarterectomy 10/21/19.   OT comments  Patient progressing well towards OT goals. Completing transfers and in room mobility with supervision, min cueing for safety and L sided attention (finding trash can, placing L hand on walker). Educated on use of 3:1 commode as shower chair, simulated transfer with min guard assist; agreeable to have family assist with transfer.  Simulated LB bathing with supervision. Discussed fall prevention, safety and home setup. Will follow acutely.    Follow Up Recommendations  No OT follow up;Supervision/Assistance - 24 hour    Equipment Recommendations  None recommended by OT    Recommendations for Other Services      Precautions / Restrictions Precautions Precautions: Fall Restrictions Weight Bearing Restrictions: No       Mobility Bed Mobility Overal bed mobility: Modified Independent                Transfers Overall transfer level: Needs assistance Equipment used: Rolling walker (2 wheeled) Transfers: Sit to/from Stand Sit to Stand: Supervision         General transfer comment: supervision for safety    Balance Overall balance assessment: Needs assistance Sitting-balance support: No upper extremity supported;Feet supported Sitting balance-Leahy Scale: Good     Standing balance support: Single extremity supported;Bilateral upper  extremity supported;During functional activity Standing balance-Leahy Scale: Good Standing balance comment: preference to UE support                           ADL either performed or assessed with clinical judgement   ADL Overall ADL's : Needs assistance/impaired     Grooming: Wash/dry hands;Supervision/safety;Standing Grooming Details (indicate cue type and reason): standing at sink, cueing to locate trash can on L side to throw away paper towel      Lower Body Bathing: Supervison/ safety;Sitting/lateral leans;Sit to/from stand Lower Body Bathing Details (indicate cue type and reason): simulated LB bathing on 3:1, educated on figure 4 technique to conserve energy and promote increased safety      Lower Body Dressing: Supervision/safety Lower Body Dressing Details (indicate cue type and reason): close supervision sit to stand  Toilet Transfer: Supervision/safety;Ambulation;RW Toilet Transfer Details (indicate cue type and reason): min cueing to place L hand on walker for mobility into restroom     Tub/ Shower Transfer: Min guard;Ambulation;3 in 1;Rolling walker Tub/Shower Transfer Details (indicate cue type and reason): reviewed use of 3:1 as shower chair in tub, simulated technique with min guard and educated on having assist from family with transfer  Functional mobility during ADLs: Supervision/safety;Rolling walker General ADL Comments: supervision overall with cueing for L sided awareness and safety, praticed mobility with IV pole to simulate cane use (as pt uses cane in room) with min guard, educated on safety and fall prevention techniques with room setup      Vision       Perception     Praxis  Cognition Arousal/Alertness: Awake/alert Behavior During Therapy: WFL for tasks assessed/performed Overall Cognitive Status: Impaired/Different from baseline Area of Impairment: Safety/judgement                         Safety/Judgement: Decreased  awareness of deficits;Decreased awareness of safety     General Comments: min cueing for safety and L sided awareness         Exercises     Shoulder Instructions       General Comments VSS on RA     Pertinent Vitals/ Pain       Pain Assessment: No/denies pain  Home Living                                          Prior Functioning/Environment              Frequency  Min 2X/week        Progress Toward Goals  OT Goals(current goals can now be found in the care plan section)  Progress towards OT goals: Progressing toward goals  Acute Rehab OT Goals Patient Stated Goal: home soon OT Goal Formulation: With patient  Plan Discharge plan remains appropriate;Frequency remains appropriate    Co-evaluation                 AM-PAC OT "6 Clicks" Daily Activity     Outcome Measure   Help from another person eating meals?: None Help from another person taking care of personal grooming?: None Help from another person toileting, which includes using toliet, bedpan, or urinal?: A Little Help from another person bathing (including washing, rinsing, drying)?: A Little Help from another person to put on and taking off regular upper body clothing?: None Help from another person to put on and taking off regular lower body clothing?: A Little 6 Click Score: 21    End of Session Equipment Utilized During Treatment: Rolling walker  OT Visit Diagnosis: Muscle weakness (generalized) (M62.81);Unsteadiness on feet (R26.81);Other symptoms and signs involving the nervous system (R29.898)   Activity Tolerance Patient tolerated treatment well   Patient Left in chair;with call bell/phone within reach   Nurse Communication Mobility status;Precautions        Time: [id number] OT Time Calculation (min): 23 min  Charges: OT General Charges $OT Visit: 1 Visit OT Treatments $Self Care/Home Management : 23-37 mins  Jolaine Artist, OT Acute Rehabilitation  Services Pager [phone] Office [place] 10/23/2019, 12:27 PM

## 2019-10-23 NOTE — Progress Notes (Addendum)
  Progress Note    10/23/2019 9:06 AM 2 Days Post-Op  Subjective:  Neuro exam remains at baseline.  Weaned from dopamine overnight.   Vitals:   10/23/19 0814 10/23/19 0815  BP:    Pulse: 90   Resp: 20   Temp:    SpO2: 93% 100%   Physical Exam: Lungs:  Non labored Incisions:  R neck incision c/d/i Extremities:  Moving extremities well Neurologic: CN grossly intact  CBC    Component Value Date/Time   WBC 6.4 10/22/2019 0451   RBC 3.43 (L) 10/22/2019 0451   HGB 10.8 (L) 10/22/2019 0451   HGB 12.7 09/01/2014 2130   HCT 31.9 (L) 10/22/2019 0451   HCT 37.9 09/01/2014 2130   PLT 259 10/22/2019 0451   PLT 260 09/05/2014 0517   MCV 93.0 10/22/2019 0451   MCV 92 09/01/2014 2130   MCH 31.5 10/22/2019 0451   MCHC 33.9 10/22/2019 0451   RDW 12.7 10/22/2019 0451   RDW 13.9 09/01/2014 2130   LYMPHSABS 2.1 10/16/2019 1717   MONOABS 0.3 10/16/2019 1717   EOSABS 0.3 10/16/2019 1717   BASOSABS 0.0 10/16/2019 1717    BMET    Component Value Date/Time   NA 139 10/22/2019 0451   NA 141 01/18/2016 1150   NA 138 09/01/2014 2130   K 3.7 10/22/2019 0451   K 3.5 09/01/2014 2130   CL 108 10/22/2019 0451   CL 104 09/01/2014 2130   CO2 23 10/22/2019 0451   CO2 28 09/01/2014 2130   GLUCOSE 165 (H) 10/22/2019 0451   GLUCOSE 287 (H) 09/01/2014 2130   BUN 8 10/22/2019 0451   BUN 12 01/18/2016 1150   BUN 14 09/01/2014 2130   CREATININE 0.73 10/22/2019 0451   CREATININE 0.80 09/30/2018 1151   CALCIUM 9.0 10/22/2019 0451   CALCIUM 8.9 09/01/2014 2130   GFRNONAA >60 10/22/2019 0451   GFRNONAA 75 09/30/2018 1151   GFRAA >60 10/22/2019 0451   GFRAA 87 09/30/2018 1151    INR    Component Value Date/Time   INR 1.1 10/16/2019 1717     Intake/Output Summary (Last 24 hours) at 10/23/2019 [id number] Last data filed at 10/23/2019 0700 Gross per 24 hour  Intake 2335.41 ml  Output 1575 ml  Net 760.41 ml     Assessment/Plan:  74 y.o. female is s/p R CEA 2 Days Post-Op   Neuro exam  stable Weaned from dopamine overnight; ok to discontinue a line if BP cuff correlating Ok for discharge from vascular standpoint when cleared medically Office will arrange follow up in 2-3 weeks with Dr. Donzetta Matters; will also discuss possible L CEA for high grade asymptomatic stenosis    Dagoberto Ligas, PA-C [facility] [phone] 10/23/2019 9:06 AM  I have independently interviewed and examined patient and agree with PA assessment and plan above.   Reyn Faivre C. Donzetta Matters, MD [facility] [place] Office: [phone] Pager: [phone]

## 2019-10-23 NOTE — Progress Notes (Signed)
Patient discharge information gone over in detail with patient. Patient medications explained and handouts gone over with patient. All questions answered to patient satisfaction. Patient discharged to home by way of transportation vehicle set up with [facility]. Waiver scanned and emailed to transportation liaison. Telemetry and IV's removed intact. Patient escorted by way of wheelchair to main entrance to meet her ride.   Lucius Conn, RN

## 2019-10-23 NOTE — Chronic Care Management (AMB) (Signed)
Chronic Care Management   Follow Up Note   10/23/2019 Name: TEARA DUERKSEN MRN: [id number] DOB: 10-03-1945  Referred by: [name], NP (Inactive) Reason for referral : Chronic Care Management (Patient Phone Call) and Care Coordination ([name])   JONITA HIROTA is a 74 y.o. year old female who is a primary care patient of [name], NP (Inactive). The CCM team was consulted for assistance with chronic disease management and care coordination needs.  Ms. Hollywood has a medical history which includes but is not limited to recent CVA (11/2018), Type 2 Diabetes, hypertension, hyperlipidemia and COPD.   Perform chart review. Note that patient was discharged from [facility] today.  I reached out to [name] by phone today.   Coordination of care call to [name].  Review of patient status, including review of consultants reports, relevant laboratory and other test results, and collaboration with appropriate care team members and the patient's provider was performed as part of comprehensive patient evaluation and provision of chronic care management services.     Outpatient Encounter Medications as of 10/23/2019  Medication Sig  . acetaminophen (TYLENOL) 500 MG tablet Take 2 tablets (1,000 mg total) by mouth every 8 (eight) hours as needed for mild pain or moderate pain.  [name] albuterol (VENTOLIN HFA) 108 (90 Base) MCG/ACT inhaler INHALE 1 TO 2 PUFFS INTO THE LUNGS EVERY 6 HOURS AS NEEDED FOR WHEEZING OR SHORTNESS OF BREATH (Patient taking differently: Inhale 1-2 puffs into the lungs every 6 (six) hours as needed for wheezing or shortness of breath. )  . [START ON 10/24/2019] aspirin EC 325 MG EC tablet Take 1 tablet (325 mg total) by mouth daily.  [name] atorvastatin (LIPITOR) 40 MG tablet TAKE 1 TABLET BY MOUTH ONCE DAILY (Patient taking differently: Take 40 mg by mouth daily. )  . baclofen (LIORESAL) 10 MG tablet TAKE 1 TABLET BY MOUTH ONCE DAILY AS  NEEDED MUSCLE SPASMS (Patient not taking: Reported on 10/16/2019)  . Blood Glucose Monitoring Suppl (ONETOUCH VERIO) w/Device KIT 1 kit by Does not apply route 2 (two) times daily.  [name] buPROPion (WELLBUTRIN XL) 150 MG 24 hr tablet TAKE 1 TABLET BY MOUTH ONCE DAILY (Patient taking differently: Take 150 mg by mouth daily. )  . Cholecalciferol (VITAMIN D3) 50 MCG (2000 UT) capsule Take 1 capsule (2,000 Units total) by mouth daily.  Derrill Memo ON 10/24/2019] clopidogrel (PLAVIX) 75 MG tablet Take 1 tablet (75 mg total) by mouth daily.  . Fluticasone-Umeclidin-Vilant (TRELEGY ELLIPTA) 100-62.5-25 MCG/INH AEPB Inhale 1 puff into the lungs daily.  [name] glucose blood (ONETOUCH VERIO) test strip Use as instructed  . Insulin Pen Needle (BD PEN NEEDLE NANO U/F) 32G X 4 MM MISC USE WITH LANTUS AS DIRECTED  . Lancet Devices (ONE TOUCH DELICA LANCING DEV) MISC 1 Device by Does not apply route 2 (two) times daily.  . meloxicam (MOBIC) 15 MG tablet TAKE 1 TABLET BY MOUTH ONCE DAILY AS NEEDED (Patient not taking: Reported on 10/16/2019)  . metFORMIN (GLUCOPHAGE-XR) 500 MG 24 hr tablet Take 1 tablet (500 mg total) by mouth 2 (two) times daily before a meal.  . nicotine polacrilex (COMMIT) 4 MG lozenge Take 4 mg by mouth as needed for smoking cessation.  [name] Lancets 51V MISC 1 Device by Does not apply route 2 (two) times daily.  . polyethylene glycol powder (GLYCOLAX/MIRALAX) 17 GM/SCOOP powder Take 17-34 g by mouth daily as needed for mild constipation or moderate constipation.  . [  DISCONTINUED]  stroke: mapping our early stages of recovery book   . [DISCONTINUED] 0.9 %  sodium chloride infusion   . [DISCONTINUED] 0.9 %  sodium chloride infusion   . [DISCONTINUED] acetaminophen (TYLENOL) 160 MG/5ML solution 650 mg   . [DISCONTINUED] acetaminophen (TYLENOL) suppository 650 mg   . [DISCONTINUED] acetaminophen (TYLENOL) tablet 650 mg   . [DISCONTINUED] alum & mag hydroxide-simeth (MAALOX/MYLANTA) 200-200-20  MG/5ML suspension 15-30 mL   . [DISCONTINUED] aspirin EC tablet 325 mg   . [DISCONTINUED] atorvastatin (LIPITOR) tablet 40 mg   . [DISCONTINUED] Chlorhexidine Gluconate Cloth 2 % PADS 6 each   . [DISCONTINUED] clopidogrel (PLAVIX) tablet 75 mg   . [DISCONTINUED] cyanocobalamin ((VITAMIN B-12)) injection 1,000 mcg   . [DISCONTINUED] docusate sodium (COLACE) capsule 100 mg   . [DISCONTINUED] DOPamine (INTROPIN) 800 mg in dextrose 5 % 250 mL (3.2 mg/mL) infusion   . [DISCONTINUED] fluticasone furoate-vilanterol (BREO ELLIPTA) 100-25 MCG/INH 1 puff   . [DISCONTINUED] guaiFENesin-dextromethorphan (ROBITUSSIN DM) 100-10 MG/5ML syrup 15 mL   . [DISCONTINUED] hydrALAZINE (APRESOLINE) injection 5 mg   . [DISCONTINUED] insulin aspart (novoLOG) injection 0-5 Units   . [DISCONTINUED] insulin aspart (novoLOG) injection 0-9 Units   . [DISCONTINUED] labetalol (NORMODYNE) injection 10 mg   . [DISCONTINUED] magnesium sulfate IVPB 2 g 50 mL   . [DISCONTINUED] MEDLINE mouth rinse   . [DISCONTINUED] metoprolol tartrate (LOPRESSOR) injection 2-5 mg   . [DISCONTINUED] morphine 2 MG/ML injection 2 mg   . [DISCONTINUED] nicotine (NICODERM CQ - dosed in mg/24 hr) patch 7 mg   . [DISCONTINUED] ondansetron (ZOFRAN) injection 4 mg   . [DISCONTINUED] oxyCODONE-acetaminophen (PERCOCET/ROXICET) 5-325 MG per tablet 1-2 tablet   . [DISCONTINUED] pantoprazole (PROTONIX) EC tablet 40 mg   . [DISCONTINUED] phenol (CHLORASEPTIC) mouth spray 1 spray   . [DISCONTINUED] phenylephrine (NEOSYNEPHRINE) 10-0.9 MG/250ML-% infusion   . [DISCONTINUED] potassium chloride SA (KLOR-CON) CR tablet 20-40 mEq   . [DISCONTINUED] umeclidinium bromide (INCRUSE ELLIPTA) 62.5 MCG/INH 1 puff    No facility-administered encounter medications on file as of 10/23/2019.    Goals Addressed            This Visit's Progress   . PharmD-Medication Adherence       Current Barriers:  . Financial Barriers . Knowledge deficits related to coordination  of her own care . Limited social support   Pharmacist Clinical Goal(s):   [name] Over the next 30 days, patient will demonstrate improved medication adherence as evidenced by verbalized understanding of prescribed medication regimen, assistance available, and patient report of adherence  Interventions: . Perform chart review. Note that patient was discharged from [facility] today. . Follow up call to Ms. Mooneyhan. Patient states that she has just gotten home. . Encourage patient/family to follow up with [name] for obtaining her medications as changes have been made. Patient states that she will call the pharmacy now to arrange to receive these. . Coordination of care call to [name]. Speak with Marcello Fennel o Review patient's medication list from discharge summary with Sam o Note that compared to medication list prior to this hospital admission (1/28-2/4) patient was to: - STOP lisinopril 10 mg - CHANGE aspirin dose to aspirin 325 mg once daily - START clopidogrel 75 mg once daily o Sam confirms new prescriptions for aspirin and clopidogrel were received by pharmacy and states that pharmacy will start to prepare patient's pill pack for her now and have this delivered by tomorrow. Pharmacy will call to coordinate delivery with  patient.  Patient Self Care Activities:  . Patient takes medications as directed with aid of adherence tools. o Using pill packaging from [facility] . Calls pharmacy for medication refills . Patient to attend scheduled medical appointments . Calls provider office for new concerns or questions  . Patient to check blood sugars and keep log . Patient to check blood pressure daily and keep log  Please see past updates related to this goal by clicking on the "Past Updates" button in the selected goal          Plan  The care management team will reach out to the patient again over the next 7 days.   [name], PharmD, [place] [employer] [phone]

## 2019-10-24 ENCOUNTER — Ambulatory Visit: Payer: Self-pay | Admitting: Pharmacist

## 2019-10-24 DIAGNOSIS — I10 Essential (primary) hypertension: Secondary | ICD-10-CM

## 2019-10-24 NOTE — Patient Instructions (Signed)
Thank you allowing the Chronic Care Management Team to be a part of your care! It was a pleasure speaking with you today!     CCM (Chronic Care Management) Team    [name] RN, MSN, CCM Nurse Care Coordinator  [phone]   [name] PharmD  Clinical Pharmacist  [phone]   [name] LCSW Clinical Social Worker [phone]  Visit Information  Goals Addressed            This Visit's Progress   . PharmD-Medication Adherence       Current Barriers:  . Financial Barriers . Knowledge deficits related to coordination of her own care . Limited social support   Pharmacist Clinical Goal(s):   [name] Over the next 30 days, patient will demonstrate improved medication adherence as evidenced by verbalized understanding of prescribed medication regimen, assistance available, and patient report of adherence  Interventions: . Counsel patient on importance of medication adherence.  o Ms. Genier confirms that she spoke with [name] and that her new pill packs are to be delivered today . Counsel patient on continued importance of blood pressure monitoring and control o Note that patient's lisinopril was discontinued at discharge. Per discharging physician, patient's blood pressure had been stable off of this medication. o Review BP monitoring technique with patient and encourage her to refer to written education that I mailed to her. o Patient to call provider office for BP readings outside of established parameters . Again provide education to patient on the importance of and strategies for lowering sodium/salt in her diet o Patient verbalizes understanding and states that she will use her "Healthy Foods Benefit" ($55.00) credit per month from health plan to aid with buying this food o Patient plans to obtain food from [employer] on either Saturday or Tuesday . Counsel on importance of smoking cessation o Patient reports currently smoking 1/2 cigarette/day  since returned home; had stopped during hospitalization o Reports still committed to stop date: 2/14  Patient Self Care Activities:  . Patient takes medications as directed with aid of adherence tools. o Using pill packaging from [facility] . Calls pharmacy for medication refills . Patient to attend scheduled medical appointments . Calls provider office for new concerns or questions  . Patient to check blood sugars and keep log . Patient to check blood pressure daily and keep log  Please see past updates related to this goal by clicking on the "Past Updates" button in the selected goal          The patient verbalized understanding of instructions provided today and declined a print copy of patient instruction materials.   The care management team will reach out to the patient again over the next 7 days.   [name], PharmD, [place] [employer] [phone]

## 2019-10-24 NOTE — Chronic Care Management (AMB) (Addendum)
Chronic Care Management   Follow Up Note   10/24/2019 Name: Kendra [name] MRN: [id number] DOB: Apr 01, 1946  Referred by: Kendra College, NP (Inactive) Reason for referral : Chronic Care Management (Patient Phone Call)   Kendra [name] is a 74 y.o. year old female who is a primary care patient of Kendra College, NP (Inactive). The CCM team was consulted for assistance with chronic disease management and care coordination needs.  Kendra [name] has a medical history which includes but is not limited to recent CVA (11/2018), Type 2 Diabetes, hypertension, hyperlipidemia and COPD.   I reached out to [name] by phone today.   Review of patient status, including review of consultants reports, relevant laboratory and other test results, and collaboration with appropriate care team members and the patient's provider was performed as part of comprehensive patient evaluation and provision of chronic care management services.     Outpatient Encounter Medications as of 10/24/2019  Medication Sig  . acetaminophen (TYLENOL) 500 MG tablet Take 2 tablets (1,000 mg total) by mouth every 8 (eight) hours as needed for mild pain or moderate pain.  [name] albuterol (VENTOLIN HFA) 108 (90 Base) MCG/ACT inhaler INHALE 1 TO 2 PUFFS INTO THE LUNGS EVERY 6 HOURS AS NEEDED FOR WHEEZING OR SHORTNESS OF BREATH (Patient taking differently: Inhale 1-2 puffs into the lungs every 6 (six) hours as needed for wheezing or shortness of breath. )  . aspirin EC 325 MG EC tablet Take 1 tablet (325 mg total) by mouth daily.  [name] atorvastatin (LIPITOR) 40 MG tablet Take 1 tablet (40 mg total) by mouth daily.  . baclofen (LIORESAL) 10 MG tablet TAKE 1 TABLET BY MOUTH ONCE DAILY AS NEEDED MUSCLE SPASMS  . Blood Glucose Monitoring Suppl (ONETOUCH VERIO) w/Device KIT 1 kit by Does not apply route 2 (two) times daily.  [name] buPROPion (WELLBUTRIN XL) 150 MG 24 hr tablet Take 1 tablet (150 mg total) by mouth daily.  .  Cholecalciferol (VITAMIN D3) 50 MCG (2000 UT) capsule Take 1 capsule (2,000 Units total) by mouth daily.  . clopidogrel (PLAVIX) 75 MG tablet Take 1 tablet (75 mg total) by mouth daily.  . Fluticasone-Umeclidin-Vilant (TRELEGY ELLIPTA) 100-62.5-25 MCG/INH AEPB Inhale 1 puff into the lungs daily.  [name] glucose blood (ONETOUCH VERIO) test strip Use as instructed  . Insulin Pen Needle (BD PEN NEEDLE NANO U/F) 32G X 4 MM MISC USE WITH LANTUS AS DIRECTED  . Lancet Devices (ONE TOUCH DELICA LANCING DEV) MISC 1 Device by Does not apply route 2 (two) times daily.  . meloxicam (MOBIC) 15 MG tablet TAKE 1 TABLET BY MOUTH ONCE DAILY AS NEEDED (Patient not taking: Reported on 10/16/2019)  . metFORMIN (GLUCOPHAGE-XR) 500 MG 24 hr tablet Take 1 tablet (500 mg total) by mouth 2 (two) times daily before a meal.  . nicotine polacrilex (COMMIT) 4 MG lozenge Take 4 mg by mouth as needed for smoking cessation.  [name] Lancets 21J MISC 1 Device by Does not apply route 2 (two) times daily.  . polyethylene glycol powder (GLYCOLAX/MIRALAX) 17 GM/SCOOP powder Take 17-34 g by mouth daily as needed for mild constipation or moderate constipation.   No facility-administered encounter medications on file as of 10/24/2019.    Goals Addressed            This Visit's Progress   . PharmD-Medication Adherence       Current Barriers:  . Financial Barriers . Knowledge deficits related to coordination of her  own care . Limited social support   Pharmacist Clinical Goal(s):   [name] Over the next 30 days, patient will demonstrate improved medication adherence as evidenced by verbalized understanding of prescribed medication regimen, assistance available, and patient report of adherence  Interventions: . Counsel patient on importance of medication adherence.  o Kendra [name] confirms that she spoke with [facility] [name] and that her new pill packs are to be delivered today . Counsel patient on continued importance of blood  pressure monitoring and control o Note that patient's lisinopril was discontinued at discharge. Per discharging physician, patient's blood pressure had been stable off of this medication. o Review BP monitoring technique with patient and encourage her to refer to written education that I mailed to her. o Patient to call provider office for BP readings outside of established parameters . Again provide education to patient on the importance of and strategies for lowering sodium/salt in her diet o Patient verbalizes understanding and states that she will use her "Healthy Foods Benefit" ($55.00) credit per month from health plan to aid with buying this food o Patient plans to obtain food from [employer] on either Saturday or Tuesday . Counsel on importance of smoking cessation o Patient reports currently smoking 1/2 cigarette/day since returned home; had stopped during hospitalization o Reports still committed to quit date: 2/14  Patient Self Care Activities:  . Patient takes medications as directed with aid of adherence tools. o Using pill packaging from [facility] [name] . Calls pharmacy for medication refills . Patient to attend scheduled medical appointments . Calls provider office for new concerns or questions  . Patient to check blood sugars and keep log . Patient to check blood pressure daily and keep log  Please see past updates related to this goal by clicking on the "Past Updates" button in the selected goal          Plan  The care management team will reach out to the patient again over the next 7 days.   Kendra [name], PharmD, [place] [employer] [phone]

## 2019-10-27 ENCOUNTER — Telehealth: Payer: Self-pay

## 2019-10-27 ENCOUNTER — Telehealth: Payer: Self-pay | Admitting: Family Medicine

## 2019-10-27 NOTE — Telephone Encounter (Signed)
A physical therapist Laverna Peace from [place] has called for getting verbal for the patient which was given for PT for 2 week X 2 and 1 week X 1, she also wanted to notified the drug interaction level III between metformin and bupropion. Also, patient was recently in the hospital and Lantus was taken off from her list, her blood sugar is in the range. Informed that she need to schedule hospital follow up.

## 2019-10-28 ENCOUNTER — Ambulatory Visit: Payer: Self-pay | Admitting: General Practice

## 2019-10-28 DIAGNOSIS — E118 Type 2 diabetes mellitus with unspecified complications: Secondary | ICD-10-CM

## 2019-10-28 DIAGNOSIS — Z794 Long term (current) use of insulin: Secondary | ICD-10-CM

## 2019-10-28 DIAGNOSIS — I10 Essential (primary) hypertension: Secondary | ICD-10-CM | POA: Diagnosis not present

## 2019-10-28 DIAGNOSIS — Z09 Encounter for follow-up examination after completed treatment for conditions other than malignant neoplasm: Secondary | ICD-10-CM

## 2019-10-28 NOTE — Chronic Care Management (AMB) (Signed)
Chronic Care Management   Follow Up Note   10/28/2019 Name: CEYLIN DREIBELBIS MRN: [id number] DOB: 24-Jan-1946  Referred by: [name], NP (Inactive) Reason for referral : Chronic Care Management (Discharge from the hospitall/ Chronic Disease Management  and  support)   Kendra [name] is a 74 y.o. year old female who is a primary care patient of [name], NP (Inactive). The CCM team was consulted for assistance with chronic disease management and care coordination needs.    Review of patient status, including review of consultants reports, relevant laboratory and other test results, and collaboration with appropriate care team members and the patient's provider was performed as part of comprehensive patient evaluation and provision of chronic care management services.    SDOH (Social Determinants of Health) screening performed today: None. See Care Plan for related entries.   Outpatient Encounter Medications as of 10/28/2019  Medication Sig  . acetaminophen (TYLENOL) 500 MG tablet Take 2 tablets (1,000 mg total) by mouth every 8 (eight) hours as needed for mild pain or moderate pain.  [name] albuterol (VENTOLIN HFA) 108 (90 Base) MCG/ACT inhaler INHALE 1 TO 2 PUFFS INTO THE LUNGS EVERY 6 HOURS AS NEEDED FOR WHEEZING OR SHORTNESS OF BREATH (Patient taking differently: Inhale 1-2 puffs into the lungs every 6 (six) hours as needed for wheezing or shortness of breath. )  . aspirin EC 325 MG EC tablet Take 1 tablet (325 mg total) by mouth daily.  [name] atorvastatin (LIPITOR) 40 MG tablet Take 1 tablet (40 mg total) by mouth daily.  . baclofen (LIORESAL) 10 MG tablet TAKE 1 TABLET BY MOUTH ONCE DAILY AS NEEDED MUSCLE SPASMS  . Blood Glucose Monitoring Suppl (ONETOUCH VERIO) w/Device KIT 1 kit by Does not apply route 2 (two) times daily.  [name] buPROPion (WELLBUTRIN XL) 150 MG 24 hr tablet Take 1 tablet (150 mg total) by mouth daily.  . Cholecalciferol (VITAMIN D3) 50 MCG (2000 UT)  capsule Take 1 capsule (2,000 Units total) by mouth daily.  . clopidogrel (PLAVIX) 75 MG tablet Take 1 tablet (75 mg total) by mouth daily.  . Fluticasone-Umeclidin-Vilant (TRELEGY ELLIPTA) 100-62.5-25 MCG/INH AEPB Inhale 1 puff into the lungs daily.  [name] glucose blood (ONETOUCH VERIO) test strip Use as instructed  . Insulin Pen Needle (BD PEN NEEDLE NANO U/F) 32G X 4 MM MISC USE WITH LANTUS AS DIRECTED  . Lancet Devices (ONE TOUCH DELICA LANCING DEV) MISC 1 Device by Does not apply route 2 (two) times daily.  . meloxicam (MOBIC) 15 MG tablet TAKE 1 TABLET BY MOUTH ONCE DAILY AS NEEDED (Patient not taking: Reported on 10/16/2019)  . metFORMIN (GLUCOPHAGE-XR) 500 MG 24 hr tablet Take 1 tablet (500 mg total) by mouth 2 (two) times daily before a meal.  . nicotine polacrilex (COMMIT) 4 MG lozenge Take 4 mg by mouth as needed for smoking cessation.  [name] Lancets 17C MISC 1 Device by Does not apply route 2 (two) times daily.  . polyethylene glycol powder (GLYCOLAX/MIRALAX) 17 GM/SCOOP powder Take 17-34 g by mouth daily as needed for mild constipation or moderate constipation.   No facility-administered encounter medications on file as of 10/28/2019.     Objective:   Goals Addressed            This Visit's Progress   . RNCM-My headaches are worse, my blood pressure is up and down (pt-stated)       Current Barriers:  [name] Knowledge Deficits related to basic understanding of  hypertension pathophysiology and self care management . Knowledge Deficits related to understanding of medications prescribed for management of hypertension . Non-adherence to prescribed medication regimen . Limited ability to follow a low sodium diet . Film/video editor.  . Transportation barrier  Case Manager Clinical Goal(s):  [name] Over the next 90 days, patient will demonstrate improved adherence to prescribed treatment plan for hypertension as evidenced by taking all medications as prescribed, monitoring and  recording blood pressure as directed, adhering to low sodium/DASH diet . Over the next 90 days the patient will utilize the [employer] Benefit with the ability to prepare heart healthy/low sodium diet in home setting . Over the next 30 days the patient will have follow up appointments with pcp and surgeon post right endarterectomy and hospitalization recently  Interventions:  . Evaluation of current treatment plan related to hypertension self management and patient's adherence to plan as established by provider. . Discussed plans with patient for ongoing care management follow up and provided patient with direct contact information for care management team . Advised patient, providing education and rationale, to monitor blood pressure daily and record, the patient stated the pcp told her to go to the ED for SBP>199, see updated vital sign record for readings over the last several days . Advised patient to call [name] for the "Healthy Foods Benefit" ($55.00) credit per month for buying food  . Verbal order received from [name] for [facility] nurse to continue to come in and assess/evaluate the patient post hospitalization for elevated blood pressure and other healthcare needs . Patient continues to check b/p BID and is following pharmacy recommendations to recheck after 23mns if it is high. The patient is keeping a log and can tell the RNCM her readings . Evaluated heart healthy diet with the patient. The patient verbalized she will be getting 15 meals a week that are low sodium and this switch will take place at the end of the week.   BP Readings from Last 3 Encounters:  10/28/19 (!) 149/113  10/23/19 (!) 108/59  10/17/19 115/66      Patient Self Care Activities:  . UNABLE to independently manage worsening headaches . Checks BP and records as discussed  Please see past updates related to this goal by clicking on the "Past Updates" button in the selected goal       .  COMPLETED: RNCM:I was exposed to [place]. What do I do       Current Barriers: Completed goal . Knowledge Deficits related to [place] concern over possible exposure on 10/08/2019, in patient with COPD and other chronic health conditions . Knowledge Deficit related to how to obtain a COVID19 vaccination   Nurse Case Manager Clinical Goal(s):  .[name] KitchenOver the next 30 days, [facility] to address needs related to CAvonexposure, presenting signs and symptoms, and vaccination information  (Patient was tested and was negative for [place] goal completed  Interventions:  . Advised patient to call her pcp or seek emergent care for worsening signs and symptoms of COVID19 . Provided education to patient re: COVID19 and signs and symptoms of COVID 19 . Discussed plans with patient for ongoing care management follow up and provided patient with direct contact information for care management team . Provided patient with [place] related to her diagnosis of COPD and possible exposure to [place] . Advised the patient of community resources in the area for receiving COVID 19 vaccination  Patient Self Care Activities:  . Calls provider office for new concerns or questions . Unable to independently manage Chronic medical conditions  Please see past updates related to this goal by clicking on the "Past Updates" button in the selected goal          Plan:   The care management team will reach out to the patient again over the next 30 days.    [name] RN, MSN, [place] Mobile: [phone]

## 2019-10-28 NOTE — Patient Instructions (Signed)
Visit Information  Goals Addressed            This Visit's Progress   . RNCM-My headaches are worse, my blood pressure is up and down (pt-stated)       Current Barriers:  [name] Knowledge Deficits related to basic understanding of hypertension pathophysiology and self care management . Knowledge Deficits related to understanding of medications prescribed for management of hypertension . Non-adherence to prescribed medication regimen . Limited ability to follow a low sodium diet . Film/video editor.  . Transportation barrier  Case Manager Clinical Goal(s):  [name] Over the next 90 days, patient will demonstrate improved adherence to prescribed treatment plan for hypertension as evidenced by taking all medications as prescribed, monitoring and recording blood pressure as directed, adhering to low sodium/DASH diet . Over the next 90 days the patient will utilize the [employer] Benefit with the ability to prepare heart healthy/low sodium diet in home setting . Over the next 30 days the patient will have follow up appointments with pcp and surgeon post right endarterectomy and hospitalization recently  Interventions:  . Evaluation of current treatment plan related to hypertension self management and patient's adherence to plan as established by provider. . Discussed plans with patient for ongoing care management follow up and provided patient with direct contact information for care management team . Advised patient, providing education and rationale, to monitor blood pressure daily and record, the patient stated the pcp told her to go to the ED for SBP>199, see updated vital sign record for readings over the last several days . Advised patient to call [facility] for the "Healthy Foods Benefit" ($55.00) credit per month for buying food  . Verbal order received from [name] for [facility] nurse to continue to come in and assess/evaluate the patient post hospitalization for elevated blood  pressure and other healthcare needs . Patient continues to check b/p BID and is following pharmacy recommendations to recheck after 55mins if it is high. The patient is keeping a log and can tell the RNCM her readings . Evaluated heart healthy diet with the patient. The patient verbalized she will be getting 15 meals a week that are low sodium and this switch will take place at the end of the week.   BP Readings from Last 3 Encounters:  10/28/19 (!) 149/113  10/23/19 (!) 108/59  10/17/19 115/66      Patient Self Care Activities:  . UNABLE to independently manage worsening headaches . Checks BP and records as discussed  Please see past updates related to this goal by clicking on the "Past Updates" button in the selected goal       . COMPLETED: RNCM:I was exposed to [place] yesterday. What do I do       Current Barriers: Completed goal . Knowledge Deficits related to [place] and concern over possible exposure on 10/08/2019, in patient with COPD and other chronic health conditions . Knowledge Deficit related to how to obtain a COVID19 vaccination   Nurse Case Manager Clinical Goal(s):  [name] Over the next 30 days, patient will work with [facility] and pcp to address needs related to Eugenio Saenz exposure, presenting signs and symptoms, and vaccination information  (Patient was tested and was negative for [place]) goal completed  Interventions:  . Advised patient to call her pcp or seek emergent care for worsening signs and symptoms of COVID19 . Provided education to patient re: COVID19 and signs and symptoms of COVID 19 . Discussed plans with patient for ongoing  care management follow up and provided patient with direct contact information for care management team . Provided patient with [place] educational materials related to her diagnosis of COPD and possible exposure to [place] recently . Advised the patient of community resources in the area for receiving COVID 19 vaccination   Patient Self Care  Activities:  . Calls provider office for new concerns or questions . Unable to independently manage Chronic medical conditions  Please see past updates related to this goal by clicking on the "Past Updates" button in the selected goal         The patient verbalized understanding of instructions provided today and declined a print copy of patient instruction materials.   The care management team will reach out to the patient again over the next 30 days.   [name] RN, MSN, [place] Mobile: [phone]

## 2019-10-31 ENCOUNTER — Ambulatory Visit: Payer: Self-pay | Admitting: Pharmacist

## 2019-10-31 DIAGNOSIS — F17200 Nicotine dependence, unspecified, uncomplicated: Secondary | ICD-10-CM

## 2019-10-31 DIAGNOSIS — Z794 Long term (current) use of insulin: Secondary | ICD-10-CM

## 2019-10-31 DIAGNOSIS — I10 Essential (primary) hypertension: Secondary | ICD-10-CM

## 2019-10-31 DIAGNOSIS — E118 Type 2 diabetes mellitus with unspecified complications: Secondary | ICD-10-CM

## 2019-10-31 NOTE — Chronic Care Management (AMB) (Signed)
Chronic Care Management   Follow Up Note   10/31/2019 Name: Kendra [name] MRN: [id number] DOB: 11-Dec-1945  Referred by: Kendra College, NP (Inactive) Reason for referral : Chronic Care Management (Patient Phone Call) and Kendra [name] is a 74 y.o. year old female who is a primary care patient of Kendra College, NP (Inactive). The CCM team was consulted for assistance with chronic disease management and care coordination needs.  Kendra [name] has a medical history which includes but is not limited to recent CVA (11/2018), Type 2 Diabetes, hypertension, hyperlipidemia and COPD.   I reached out to Kendra [name] by phone today.   Review of patient status, including review of consultants reports, relevant laboratory and other test results, and collaboration with appropriate care team members and the patient's provider was performed as part of comprehensive patient evaluation and provision of chronic care management services.    SDOH (Social Determinants of Health) screening performed today: Tobacco Use. See Care Plan for related entries.   Outpatient Encounter Medications as of 10/31/2019  Medication Sig  . metFORMIN (GLUCOPHAGE-XR) 500 MG 24 hr tablet Take 1 tablet (500 mg total) by mouth 2 (two) times daily before a meal.  . acetaminophen (TYLENOL) 500 MG tablet Take 2 tablets (1,000 mg total) by mouth every 8 (eight) hours as needed for mild pain or moderate pain.  [name] albuterol (VENTOLIN HFA) 108 (90 Base) MCG/ACT inhaler INHALE 1 TO 2 PUFFS INTO THE LUNGS EVERY 6 HOURS AS NEEDED FOR WHEEZING OR SHORTNESS OF BREATH (Patient taking differently: Inhale 1-2 puffs into the lungs every 6 (six) hours as needed for wheezing or shortness of breath. )  . aspirin EC 325 MG EC tablet Take 1 tablet (325 mg total) by mouth daily.  [name] atorvastatin (LIPITOR) 40 MG tablet Take 1 tablet (40 mg total) by mouth daily.  . baclofen (LIORESAL) 10 MG tablet TAKE 1 TABLET BY  MOUTH ONCE DAILY AS NEEDED MUSCLE SPASMS  . Blood Glucose Monitoring Suppl (ONETOUCH VERIO) w/Device KIT 1 kit by Does not apply route 2 (two) times daily.  [name] buPROPion (WELLBUTRIN XL) 150 MG 24 hr tablet Take 1 tablet (150 mg total) by mouth daily.  . Cholecalciferol (VITAMIN D3) 50 MCG (2000 UT) capsule Take 1 capsule (2,000 Units total) by mouth daily.  . clopidogrel (PLAVIX) 75 MG tablet Take 1 tablet (75 mg total) by mouth daily.  . Fluticasone-Umeclidin-Vilant (TRELEGY ELLIPTA) 100-62.5-25 MCG/INH AEPB Inhale 1 puff into the lungs daily.  [name] glucose blood (ONETOUCH VERIO) test strip Use as instructed  . Insulin Pen Needle (BD PEN NEEDLE NANO U/F) 32G X 4 MM MISC USE WITH LANTUS AS DIRECTED  . Lancet Devices (ONE TOUCH DELICA LANCING DEV) MISC 1 Device by Does not apply route 2 (two) times daily.  . meloxicam (MOBIC) 15 MG tablet TAKE 1 TABLET BY MOUTH ONCE DAILY AS NEEDED (Patient not taking: Reported on 10/16/2019)  . nicotine polacrilex (COMMIT) 4 MG lozenge Take 4 mg by mouth as needed for smoking cessation.  Kendra [name] Delica Lancets 11B MISC 1 Device by Does not apply route 2 (two) times daily.  . polyethylene glycol powder (GLYCOLAX/MIRALAX) 17 GM/SCOOP powder Take 17-34 g by mouth daily as needed for mild constipation or moderate constipation.   No facility-administered encounter medications on file as of 10/31/2019.    Goals Addressed            This Visit's Progress   . PharmD-Medication Adherence  Current Barriers:  . Financial Barriers . Knowledge deficits related to coordination of her own care . Limited social support   Pharmacist Clinical Goal(s):   [name] Over the next 30 days, patient will demonstrate improved medication adherence as evidenced by verbalized understanding of prescribed medication regimen, assistance available, and patient report of adherence  Interventions: . Counsel patient on importance of medication adherence. Kendra [name] currently receiving  services from Kendra [name] doing well with PT. Noticing improvement with dragging of her left foot.  [name] Counsel patient on continued importance of blood pressure monitoring and control o Note patient remains off of antihypertensive medication  o Reports HH RN came today and told her that her BP. States that her BP was "good", but cannot recall the reading. o Reports HH RN did check her current BP meter readings against her own to determine accuracy. Advised patient needs new BP monitor; current monitor not accurate. . Kendra [name] states that she is not sure if she has any of her health plan over the counter (OTC) benefit remaining for this quarter. o Call Kendra [name]) with patient on the line. Representative reports patient's benefit for this calendar year is provided through a Kendra [name], patient as a benefit of $300/quarter. o Patient reports that she has misplaced this card. Representative states that she will mail patient a replacement card now. . Counsel patient that once she receives this new OTC benefit card in the mail, to use at local pharmacy to obtain new upper arm BP monitor. . Coordination of care: Per chart, patient has f/u appointment scheduled with Kendra [name] of [facility] on 2/26 at 11 am.  o Provide patient with phone number. States that she will call now for instructions for when to arrive for appointment  o States will then call to schedule her transportation.  Kendra [name] on continued importance of blood sugar control o Reports taking metformin ER 500 mg twice daily from pill pack o Report morning fasting CBG today: 87  . Counsel on importance of smoking cessation o Patient reports currently smoking 1/2 cigarette/day since returned home; had stopped during hospitalization o Reports still committed to quit date: 2/14  Patient Self Care Activities:  . Patient takes medications as directed with aid of adherence tools. o  Using pill packaging from [facility] . Calls pharmacy for medication refills . Patient to attend scheduled medical appointments . Calls provider office for new concerns or questions  . Patient to check blood sugars and keep log . Patient to check blood pressure daily and keep log  Please see past updates related to this goal by clicking on the "Past Updates" button in the selected goal          Plan  The care management team will reach out to the patient again over the next 14 days.   [name], PharmD, [employer] [phone]

## 2019-10-31 NOTE — Patient Instructions (Signed)
Thank you allowing the Chronic Care Management Team to be a part of your care! It was a pleasure speaking with you today!     CCM (Chronic Care Management) Team    [name] RN, MSN, CCM Nurse Care Coordinator  [phone]   [name] PharmD  Clinical Pharmacist  [phone]   [name] LCSW Clinical Social Worker [phone]  Visit Information  Goals Addressed            This Visit's Progress   . PharmD-Medication Adherence       Current Barriers:  . Financial Barriers . Knowledge deficits related to coordination of her own care . Limited social support   Pharmacist Clinical Goal(s):   [name] Over the next 30 days, patient will demonstrate improved medication adherence as evidenced by verbalized understanding of prescribed medication regimen, assistance available, and patient report of adherence  Interventions: . Counsel patient on importance of medication adherence. [name] currently receiving services from [place] doing well with PT. Noticing improvement with dragging of her left foot.  [name] Counsel patient on continued importance of blood pressure monitoring and control o Note patient remains off of antihypertensive medication  o Reports HH RN came today and told her that her BP. States that her BP was "good", but cannot recall the reading. o Reports HH RN did check her current BP meter readings against her own to determine accuracy. Advised patient needs new BP monitor; current monitor not accurate. . Ms. Tsay states that she is not sure if she has any of her health plan over the counter (OTC) benefit remaining for this quarter. o Call [employer] [facility]) with patient on the line. Representative reports patient's benefit for this calendar year is provided through a [employer], patient as a benefit of $300/quarter. o Patient reports that she has misplaced this card. Representative states that she will mail patient a  replacement card now. . Counsel patient that once she receives this new OTC benefit card in the mail, to use at local pharmacy to obtain new upper arm BP monitor. . Coordination of care: Per chart, patient has f/u appointment scheduled with Dr. Donzetta Matters of [facility] on 2/26 at 11 am.  o Provide patient with phone number. States that she will call now for instructions for when to arrive for appointment  o States will then call to schedule her transportation.  [name] on continued importance of blood sugar control o Reports taking metformin ER 500 mg twice daily from pill pack o Report morning fasting CBG today: 87  . Counsel on importance of smoking cessation o Patient reports currently smoking 1/2 cigarette/day since returned home; had stopped during hospitalization o Reports still committed to quit date: 2/14  Patient Self Care Activities:  . Patient takes medications as directed with aid of adherence tools. o Using pill packaging from [facility] . Calls pharmacy for medication refills . Patient to attend scheduled medical appointments . Calls provider office for new concerns or questions  . Patient to check blood sugars and keep log . Patient to check blood pressure daily and keep log  Please see past updates related to this goal by clicking on the "Past Updates" button in the selected goal          The patient verbalized understanding of instructions provided today and declined a print copy of patient instruction materials.   The care management team will  reach out to the patient again over the next 14 days.   [name], PharmD, [place] [employer] [phone]

## 2019-11-04 ENCOUNTER — Ambulatory Visit (INDEPENDENT_AMBULATORY_CARE_PROVIDER_SITE_OTHER): Payer: Medicare Other

## 2019-11-04 ENCOUNTER — Ambulatory Visit: Payer: Self-pay | Admitting: Licensed Clinical Social Worker

## 2019-11-04 VITALS — Ht 64.0 in | Wt 133.0 lb

## 2019-11-04 DIAGNOSIS — E118 Type 2 diabetes mellitus with unspecified complications: Secondary | ICD-10-CM

## 2019-11-04 DIAGNOSIS — Z794 Long term (current) use of insulin: Secondary | ICD-10-CM

## 2019-11-04 DIAGNOSIS — Z0000 Encounter for general adult medical examination without abnormal findings: Secondary | ICD-10-CM

## 2019-11-04 DIAGNOSIS — I10 Essential (primary) hypertension: Secondary | ICD-10-CM

## 2019-11-04 DIAGNOSIS — Z1231 Encounter for screening mammogram for malignant neoplasm of breast: Secondary | ICD-10-CM

## 2019-11-04 NOTE — Progress Notes (Signed)
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 glaucoma and other disorders of the eye. Is the patient up to date with their annual eye exam?  Yes  Who is the provider or what is the name of the office in which the pt attends annual eye exams? [facility]   Dental Screening: Recommended annual dental exams for proper oral hygiene  Community Resource Referral:  CRR required this visit?  Yes, needs grab bar in bathroom.      Plan:  I have personally reviewed and addressed the Medicare Annual Wellness questionnaire and have noted the following in the patient's chart:  A. Medical and social history B. Use of alcohol, tobacco or illicit drugs  C. Current medications and supplements D. Functional ability and status E.  Nutritional status F.  Physical activity G. Advance directives H. List of other physicians I.  Hospitalizations, surgeries, and ER visits in previous 12 months J.  [place] such as hearing and vision if needed, cognitive and depression L. Referrals and appointments   In addition, I have reviewed and discussed with patient certain preventive protocols, quality metrics, and  best practice recommendations. A written personalized care plan for preventive services as well as general preventive health recommendations were provided to patient.  Signed,    Bevelyn Ngo, LPN  7/98/1025 Nurse Health Advisor   Nurse Notes: none

## 2019-11-04 NOTE — Patient Instructions (Signed)
Kendra [name] , Thank you for taking time to come for your Medicare Wellness Visit. I appreciate your ongoing commitment to your health goals. Please review the following plan we discussed and let me know if I can assist you in the future.   Screening recommendations/referrals: Colonoscopy: cologuard ordered Mammogram: Please call [phone] to schedule your mammogram.  Bone Density: up to date  Recommended yearly ophthalmology/optometry visit for glaucoma screening and checkup Recommended yearly dental visit for hygiene and checkup  Vaccinations: Influenza vaccine: up to date  Pneumococcal vaccine: up to date  Tdap vaccine: up to date  Shingles vaccine: shingrix eligible  Covid-19: information provided  Advanced directives: please pick up a copy of this information next time you are in the office   Conditions/risks identified: diabetic  Next appointment: Follow up in one year for your annual wellness visit    Preventive Care 42 Years and Older, Female Preventive care refers to lifestyle choices and visits with your health care provider that can promote health and wellness. What does preventive care include?  A yearly physical exam. This is also called an annual well check.  Dental exams once or twice a year.  Routine eye exams. Ask your health care provider how often you should have your eyes checked.  Personal lifestyle choices, including:  Daily care of your teeth and gums.  Regular physical activity.  Eating a healthy diet.  Avoiding tobacco and drug use.  Limiting alcohol use.  Practicing safe sex.  Taking low-dose aspirin every day.  Taking vitamin and mineral supplements as recommended by your health care provider. What happens during an annual well check? The services and screenings done by your health care provider during your annual well check will depend on your age, overall health, lifestyle risk factors, and family history of disease. Counseling    Your health care provider may ask you questions about your:  Alcohol use.  Tobacco use.  Drug use.  Emotional well-being.  Home and relationship well-being.  Sexual activity.  Eating habits.  History of falls.  Memory and ability to understand (cognition).  Work and work Statistician.  Reproductive health. Screening  You may have the following tests or measurements:  Height, weight, and BMI.  Blood pressure.  Lipid and cholesterol levels. These may be checked every 5 years, or more frequently if you are over 69 years old.  Skin check.  Lung cancer screening. You may have this screening every year starting at age 92 if you have a 30-pack-year history of smoking and currently smoke or have quit within the past 15 years.  Fecal occult blood test (FOBT) of the stool. You may have this test every year starting at age 11.  Flexible sigmoidoscopy or colonoscopy. You may have a sigmoidoscopy every 5 years or a colonoscopy every 10 years starting at age 33.  Hepatitis C blood test.  Hepatitis B blood test.  Sexually transmitted disease (STD) testing.  Diabetes screening. This is done by checking your blood sugar (glucose) after you have not eaten for a while (fasting). You may have this done every 1-3 years.  Bone density scan. This is done to screen for osteoporosis. You may have this done starting at age 12.  Mammogram. This may be done every 1-2 years. Talk to your health care provider about how often you should have regular mammograms. Talk with your health care provider about your test results, treatment options, and if necessary, the need for more tests. Vaccines  Your health care provider  may recommend certain vaccines, such as:  Influenza vaccine. This is recommended every year.  Tetanus, diphtheria, and acellular pertussis (Tdap, Td) vaccine. You may need a Td booster every 10 years.  Zoster vaccine. You may need this after age 72.  Pneumococcal 13-valent  conjugate (PCV13) vaccine. One dose is recommended after age 33.  Pneumococcal polysaccharide (PPSV23) vaccine. One dose is recommended after age 71. Talk to your health care provider about which screenings and vaccines you need and how often you need them. This information is not intended to replace advice given to you by your health care provider. Make sure you discuss any questions you have with your health care provider. Document Released: 10/01/2015 Document Revised: 05/24/2016 Document Reviewed: 07/06/2015 Elsevier Interactive Patient Education  2017 [place] Prevention in the Home Falls can cause injuries. They can happen to people of all ages. There are many things you can do to make your home safe and to help prevent falls. What can I do on the outside of my home?  Regularly fix the edges of walkways and driveways and fix any cracks.  Remove anything that might make you trip as you walk through a door, such as a raised step or threshold.  Trim any bushes or trees on the path to your home.  Use bright outdoor lighting.  Clear any walking paths of anything that might make someone trip, such as rocks or tools.  Regularly check to see if handrails are loose or broken. Make sure that both sides of any steps have handrails.  Any raised decks and porches should have guardrails on the edges.  Have any leaves, snow, or ice cleared regularly.  Use sand or salt on walking paths during winter.  Clean up any spills in your garage right away. This includes oil or grease spills. What can I do in the bathroom?  Use night lights.  Install grab bars by the toilet and in the tub and shower. Do not use towel bars as grab bars.  Use non-skid mats or decals in the tub or shower.  If you need to sit down in the shower, use a plastic, non-slip stool.  Keep the floor dry. Clean up any water that spills on the floor as soon as it happens.  Remove soap buildup in the tub or  shower regularly.  Attach bath mats securely with double-sided non-slip rug tape.  Do not have throw rugs and other things on the floor that can make you trip. What can I do in the bedroom?  Use night lights.  Make sure that you have a light by your bed that is easy to reach.  Do not use any sheets or blankets that are too big for your bed. They should not hang down onto the floor.  Have a firm chair that has side arms. You can use this for support while you get dressed.  Do not have throw rugs and other things on the floor that can make you trip. What can I do in the kitchen?  Clean up any spills right away.  Avoid walking on wet floors.  Keep items that you use a lot in easy-to-reach places.  If you need to reach something above you, use a strong step stool that has a grab bar.  Keep electrical cords out of the way.  Do not use floor polish or wax that makes floors slippery. If you must use wax, use non-skid floor wax.  Do not have throw rugs  and other things on the floor that can make you trip. What can I do with my stairs?  Do not leave any items on the stairs.  Make sure that there are handrails on both sides of the stairs and use them. Fix handrails that are broken or loose. Make sure that handrails are as long as the stairways.  Check any carpeting to make sure that it is firmly attached to the stairs. Fix any carpet that is loose or worn.  Avoid having throw rugs at the top or bottom of the stairs. If you do have throw rugs, attach them to the floor with carpet tape.  Make sure that you have a light switch at the top of the stairs and the bottom of the stairs. If you do not have them, ask someone to add them for you. What else can I do to help prevent falls?  Wear shoes that:  Do not have high heels.  Have rubber bottoms.  Are comfortable and fit you well.  Are closed at the toe. Do not wear sandals.  If you use a stepladder:  Make sure that it is fully  opened. Do not climb a closed stepladder.  Make sure that both sides of the stepladder are locked into place.  Ask someone to hold it for you, if possible.  Clearly mark and make sure that you can see:  Any grab bars or handrails.  First and last steps.  Where the edge of each step is.  Use tools that help you move around (mobility aids) if they are needed. These include:  Canes.  Walkers.  Scooters.  Crutches.  Turn on the lights when you go into a dark area. Replace any light bulbs as soon as they burn out.  Set up your furniture so you have a clear path. Avoid moving your furniture around.  If any of your floors are uneven, fix them.  If there are any pets around you, be aware of where they are.  Review your medicines with your doctor. Some medicines can make you feel dizzy. This can increase your chance of falling. Ask your doctor what other things that you can do to help prevent falls. This information is not intended to replace advice given to you by your health care provider. Make sure you discuss any questions you have with your health care provider. Document Released: 07/01/2009 Document Revised: 02/10/2016 Document Reviewed: 10/09/2014 Elsevier Interactive Patient Education  2017 [employer].
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
## 2019-11-11 ENCOUNTER — Ambulatory Visit: Payer: Self-pay | Admitting: Pharmacist

## 2019-11-11 DIAGNOSIS — I10 Essential (primary) hypertension: Secondary | ICD-10-CM

## 2019-11-11 NOTE — Patient Instructions (Signed)
Thank you allowing the Chronic Care Management Team to be a part of your care! It was a pleasure speaking with you today!     CCM (Chronic Care Management) Team    [name] RN, MSN, CCM Nurse Care Coordinator  [phone]   [name] PharmD  Clinical Pharmacist  [phone]   [name] LCSW Clinical Social Worker [phone]  Visit Information  Goals Addressed            This Visit's Progress   . PharmD-Medication Adherence       Current Barriers:  . Financial Barriers . Knowledge deficits related to coordination of her own care . Limited social support   Pharmacist Clinical Goal(s):   [name] Over the next 30 days, patient will demonstrate improved medication adherence as evidenced by verbalized understanding of prescribed medication regimen, assistance available, and patient report of adherence  Interventions: . Speak with [facility] RN who is currently in patient's home o Reports recent BP results: - Today: 126/74 - Yesterday: 138/62 . Speak with Ms. Berringer and agree to call back at another time as [facility] provider currently in home.  Patient Self Care Activities:  . Patient takes medications as directed with aid of adherence tools. o Using pill packaging from [facility] . Calls pharmacy for medication refills . Patient to attend scheduled medical appointments . Calls provider office for new concerns or questions  . Patient to check blood sugars and keep log . Patient to check blood pressure daily and keep log  Please see past updates related to this goal by clicking on the "Past Updates" button in the selected goal          Patient verbalizes understanding of instructions provided today.   The care management team will reach out to the patient again over the next 14 days.   [name], PharmD, [place] [employer] [phone]

## 2019-11-11 NOTE — Chronic Care Management (AMB) (Signed)
Chronic Care Management   Follow Up Note   11/11/2019 Name: Kendra [name] MRN: [id number] DOB: 1946/08/26  Referred by: Kendra College, NP (Inactive) Reason for referral : Chronic Care Management (Patient Phone Call)   Kendra [name] is a 74 y.o. year old female who is a primary care patient of Kendra College, NP (Inactive). The CCM team was consulted for assistance with chronic disease management and care coordination needs.  Kendra [name] has a medical history which includes but is not limited to recent CVA (11/2018), Type 2 Diabetes, hypertension, hyperlipidemia and COPD.   I reached out to Kendra [name] by phone today. Also speak with [facility] RN who is present with patient.  Review of patient status, including review of consultants reports, relevant laboratory and other test results, and collaboration with appropriate care team members and the patient's provider was performed as part of comprehensive patient evaluation and provision of chronic care management services.     Outpatient Encounter Medications as of 11/11/2019  Medication Sig  . acetaminophen (TYLENOL) 500 MG tablet Take 2 tablets (1,000 mg total) by mouth every 8 (eight) hours as needed for mild pain or moderate pain.  [name] albuterol (VENTOLIN HFA) 108 (90 Base) MCG/ACT inhaler INHALE 1 TO 2 PUFFS INTO THE LUNGS EVERY 6 HOURS AS NEEDED FOR WHEEZING OR SHORTNESS OF BREATH (Patient taking differently: Inhale 1-2 puffs into the lungs every 6 (six) hours as needed for wheezing or shortness of breath. )  . aspirin EC 325 MG EC tablet Take 1 tablet (325 mg total) by mouth daily.  [name] atorvastatin (LIPITOR) 40 MG tablet Take 1 tablet (40 mg total) by mouth daily.  . Blood Glucose Monitoring Suppl (ONETOUCH VERIO) w/Device KIT 1 kit by Does not apply route 2 (two) times daily.  [name] buPROPion (WELLBUTRIN XL) 150 MG 24 hr tablet Take 1 tablet (150 mg total) by mouth daily.  . Cholecalciferol (VITAMIN D3) 50 MCG (2000 UT)  capsule Take 1 capsule (2,000 Units total) by mouth daily.  . clopidogrel (PLAVIX) 75 MG tablet Take 1 tablet (75 mg total) by mouth daily.  . Fluticasone-Umeclidin-Vilant (TRELEGY ELLIPTA) 100-62.5-25 MCG/INH AEPB Inhale 1 puff into the lungs daily.  [name] glucose blood (ONETOUCH VERIO) test strip Use as instructed  . Insulin Pen Needle (BD PEN NEEDLE NANO U/F) 32G X 4 MM MISC USE WITH LANTUS AS DIRECTED  . Lancet Devices (ONE TOUCH DELICA LANCING DEV) MISC 1 Device by Does not apply route 2 (two) times daily.  . meloxicam (MOBIC) 15 MG tablet TAKE 1 TABLET BY MOUTH ONCE DAILY AS NEEDED (Patient not taking: Reported on 10/16/2019)  . metFORMIN (GLUCOPHAGE-XR) 500 MG 24 hr tablet Take 1 tablet (500 mg total) by mouth 2 (two) times daily before a meal.  . nicotine polacrilex (COMMIT) 4 MG lozenge Take 4 mg by mouth as needed for smoking cessation.  Kendra [name] Delica Lancets 34V MISC 1 Device by Does not apply route 2 (two) times daily.  . polyethylene glycol powder (GLYCOLAX/MIRALAX) 17 GM/SCOOP powder Take 17-34 g by mouth daily as needed for mild constipation or moderate constipation.   No facility-administered encounter medications on file as of 11/11/2019.    Goals Addressed            This Visit's Progress   . PharmD-Medication Adherence       Current Barriers:  . Financial Barriers . Knowledge deficits related to coordination of her own care . Limited social support   Pharmacist  Clinical Goal(s):   [name] Over the next 30 days, patient will demonstrate improved medication adherence as evidenced by verbalized understanding of prescribed medication regimen, assistance available, and patient report of adherence  Interventions: . Speak with [facility] RN who is currently in patient's home o Reports recent BP results: - Today: 126/74 - Yesterday: 138/62 . Speak with Kendra [name] and agree to call back at another time as [facility] provider currently in home.  Patient Self Care Activities:   . Patient takes medications as directed with aid of adherence tools. o Using pill packaging from [facility] . Calls pharmacy for medication refills . Patient to attend scheduled medical appointments . Calls provider office for new concerns or questions  . Patient to check blood sugars and keep log . Patient to check blood pressure daily and keep log  Please see past updates related to this goal by clicking on the "Past Updates" button in the selected goal          Plan  The care management team will reach out to the patient again over the next 14 days.   [name], PharmD, [place] [employer] [phone]

## 2019-11-13 ENCOUNTER — Telehealth (HOSPITAL_COMMUNITY): Payer: Self-pay

## 2019-11-13 NOTE — Telephone Encounter (Signed)

## 2019-11-14 ENCOUNTER — Other Ambulatory Visit: Payer: Self-pay

## 2019-11-14 ENCOUNTER — Ambulatory Visit (INDEPENDENT_AMBULATORY_CARE_PROVIDER_SITE_OTHER): Payer: Self-pay | Admitting: Vascular Surgery

## 2019-11-14 ENCOUNTER — Encounter: Payer: Self-pay | Admitting: Vascular Surgery

## 2019-11-14 VITALS — BP 110/71 | HR 79 | Temp 97.3°F | Resp 20 | Ht 64.0 in | Wt 119.0 lb

## 2019-11-14 DIAGNOSIS — I6523 Occlusion and stenosis of bilateral carotid arteries: Secondary | ICD-10-CM

## 2019-11-14 NOTE — Progress Notes (Signed)
Patient ID: Kendra [name], female   DOB: [birth year], 74 y.o.   MRN: [id number]  Reason for Consult: Post-op Follow-up   Referred by No ref. provider found  Subjective:     HPI:  Kendra [name] is a 74 y.o. female status post right carotid endarterectomy for symptomatic disease.  She has done very well continues with therapy.  States that her left arm and leg strength is coming back.  She does have some pain in the right neck.  She is swallowing fine states that her voice is at baseline.  She is taking Plavix also taking a statin.  She continues to smoke daily.  No further neurologic events since surgery.  Past Medical History:  Diagnosis Date  . Back pain   . Cataract   . Cervical disc disorder   . Chest pain   . COPD (chronic obstructive pulmonary disease) ([place])   . Depression   . Diabetes ([place])   . Diabetic neuropathy ([place])   . GERD (gastroesophageal reflux disease)   . Hyperlipidemia   . Insomnia   . Neuropathy   . Osteoarthritis   . Osteopenia   . Reflux   . Rheumatoid arthritis ([place])   . Sleep apnea    no CPAP and no suggestion that she needed one per pt  . Stroke ([place]) 2015   and again 2017  - Weakness in left leg, staggers w/ walking  . Tenosynovitis    Family History  Problem Relation Age of Onset  . Stroke Father   . Depression Father   . Diabetes Mother   . Hyperlipidemia Mother   . Drug abuse Sister   . Heart murmur Son   . Drug abuse Sister   . Heart murmur Son   . Heart murmur Son    Past Surgical History:  Procedure Laterality Date  . BREAST BIOPSY    . ENDARTERECTOMY Right 10/21/2019   Procedure: ENDARTERECTOMY CAROTID RIGHT;  Surgeon: [name], MD;  Location: [facility];  Service: Vascular;  Laterality: Right;  . NECK SURGERY    . OVARIAN CYST REMOVAL    . PATCH ANGIOPLASTY Right 10/21/2019   Procedure: Patch Angioplasty Using XenoSure Biologic Patch Right Carotid;  Surgeon: [name], MD;  Location: [facility];   Service: Vascular;  Laterality: Right;    Short Social History:  Social History   Tobacco Use  . Smoking status: Current Some Day Smoker    Packs/day: 1.00    Types: Cigarettes  . Smokeless tobacco: Former Systems developer    Types: Snuff  Substance Use Topics  . Alcohol use: No    Allergies  Allergen Reactions  . Citalopram Nausea And Vomiting    Current Outpatient Medications  Medication Sig Dispense Refill  . acetaminophen (TYLENOL) 500 MG tablet Take 2 tablets (1,000 mg total) by mouth every 8 (eight) hours as needed for mild pain or moderate pain. 270 tablet 1  . albuterol (VENTOLIN HFA) 108 (90 Base) MCG/ACT inhaler INHALE 1 TO 2 PUFFS INTO THE LUNGS EVERY 6 HOURS AS NEEDED FOR WHEEZING OR SHORTNESS OF BREATH (Patient taking differently: Inhale 1-2 puffs into the lungs every 6 (six) hours as needed for wheezing or shortness of breath. ) 18 g 2  . aspirin EC 325 MG EC tablet Take 1 tablet (325 mg total) by mouth daily. 30 tablet 1  . atorvastatin (LIPITOR) 40 MG tablet Take 1 tablet (40 mg total) by mouth daily. 90 tablet 0  . Blood Glucose  Monitoring Suppl (ONETOUCH VERIO) w/Device KIT 1 kit by Does not apply route 2 (two) times daily. 1 kit 0  . buPROPion (WELLBUTRIN XL) 150 MG 24 hr tablet Take 1 tablet (150 mg total) by mouth daily. 90 tablet 0  . Cholecalciferol (VITAMIN D3) 50 MCG (2000 UT) capsule Take 1 capsule (2,000 Units total) by mouth daily. 90 capsule 1  . clopidogrel (PLAVIX) 75 MG tablet Take 1 tablet (75 mg total) by mouth daily. 30 tablet 0  . Fluticasone-Umeclidin-Vilant (TRELEGY ELLIPTA) 100-62.5-25 MCG/INH AEPB Inhale 1 puff into the lungs daily. 60 each 3  . glucose blood (ONETOUCH VERIO) test strip Use as instructed 200 each 4  . Insulin Pen Needle (BD PEN NEEDLE NANO U/F) 32G X 4 MM MISC USE WITH LANTUS AS DIRECTED 90 each 3  . Lancet Devices (ONE TOUCH DELICA LANCING DEV) MISC 1 Device by Does not apply route 2 (two) times daily. 1 each 0  . metFORMIN  (GLUCOPHAGE-XR) 500 MG 24 hr tablet Take 1 tablet (500 mg total) by mouth 2 (two) times daily before a meal. 60 tablet 3  . nicotine polacrilex (COMMIT) 4 MG lozenge Take 4 mg by mouth as needed for smoking cessation.    [name] Lancets 85O MISC 1 Device by Does not apply route 2 (two) times daily. 200 each 4  . polyethylene glycol powder (GLYCOLAX/MIRALAX) 17 GM/SCOOP powder Take 17-34 g by mouth daily as needed for mild constipation or moderate constipation. 255 g 1  . meloxicam (MOBIC) 15 MG tablet TAKE 1 TABLET BY MOUTH ONCE DAILY AS NEEDED (Patient not taking: Reported on 10/16/2019) 90 tablet 0   No current facility-administered medications for this visit.    Review of Systems  Constitutional: Positive for fatigue.  HENT: HENT negative.  Eyes: Eyes negative.  Respiratory: Respiratory negative.  Cardiovascular: Cardiovascular negative.  GI: Gastrointestinal negative.  Musculoskeletal: Musculoskeletal negative.  Skin: Skin negative.  Neurological: Positive for focal weakness.  Hematologic: Hematologic/lymphatic negative.  Psychiatric: Psychiatric negative.        Objective:  Objective  Vitals:   11/14/19 1027 11/14/19 1031  BP: 103/74 110/71  Pulse: 79   Resp: 20   Temp: (!) 97.3 F (36.3 C)   SpO2: 98%      Physical Exam Constitutional:      Appearance: Normal appearance.  HENT:     Head: Normocephalic.     Nose:     Comments: Mask in place    Mouth/Throat:     Mouth: Mucous membranes are moist.  Eyes:     Pupils: Pupils are equal, round, and reactive to light.  Neck:     Vascular: Carotid bruit present.  Cardiovascular:     Rate and Rhythm: Normal rate and regular rhythm.  Pulmonary:     Effort: Pulmonary effort is normal.  Abdominal:     General: Abdomen is flat.     Palpations: Abdomen is soft.  Musculoskeletal:        General: Normal range of motion.  Skin:    General: Skin is warm and dry.     Capillary Refill: Capillary refill takes  less than 2 seconds.  Neurological:     General: No focal deficit present.     Mental Status: She is alert.  Psychiatric:        Mood and Affect: Mood normal.        Behavior: Behavior normal.        Thought Content: Thought content normal.  Judgment: Judgment normal.     Data: CTA reviewed     Assessment/Plan:    74 yo female status post right carotid endarterectomy for symptomatic disease.  Has left ICA string sign with bruit.  Patient would like to wait 1 month and then will proceed with left carotid endarterectomy on elective basis.  She will continue Plavix and statin.  I have counseled her on smoking cessation again.  We discussed the signs and symptoms of stroke TIA amaurosis which would require emergent evaluation.      [name] MD [facility]

## 2019-11-17 ENCOUNTER — Telehealth: Payer: Self-pay

## 2019-11-17 ENCOUNTER — Ambulatory Visit (INDEPENDENT_AMBULATORY_CARE_PROVIDER_SITE_OTHER): Payer: Medicare Other | Admitting: Pharmacist

## 2019-11-17 ENCOUNTER — Other Ambulatory Visit: Payer: Self-pay | Admitting: Family Medicine

## 2019-11-17 DIAGNOSIS — F17200 Nicotine dependence, unspecified, uncomplicated: Secondary | ICD-10-CM

## 2019-11-17 DIAGNOSIS — M19011 Primary osteoarthritis, right shoulder: Secondary | ICD-10-CM

## 2019-11-17 DIAGNOSIS — Z794 Long term (current) use of insulin: Secondary | ICD-10-CM

## 2019-11-17 DIAGNOSIS — E118 Type 2 diabetes mellitus with unspecified complications: Secondary | ICD-10-CM

## 2019-11-17 DIAGNOSIS — I10 Essential (primary) hypertension: Secondary | ICD-10-CM

## 2019-11-17 NOTE — Chronic Care Management (AMB) (Signed)
Chronic Care Management   Follow Up Note   11/17/2019 Name: Kendra [name] MRN: [id number] DOB: 21-Jan-1946  Referred by: [name], NP (Inactive) Reason for referral : Chronic Care Management (Patient Phone Call)   Kendra [name] is a 74 y.o. year old female who is a primary care patient of [name], NP (Inactive). The CCM team was consulted for assistance with chronic disease management and care coordination needs.   Kendra [name] has a medical history which includes but is not limited to recent CVA (11/2018), Type 2 Diabetes, hypertension, hyperlipidemia and COPD.   I reached out to Kendra [name] by phone today.   Review of patient status, including review of consultants reports, relevant laboratory and other test results, and collaboration with appropriate care team members and the patient's provider was performed as part of comprehensive patient evaluation and provision of chronic care management services.     Outpatient Encounter Medications as of 11/17/2019  Medication Sig  . acetaminophen (TYLENOL) 500 MG tablet Take 2 tablets (1,000 mg total) by mouth every 8 (eight) hours as needed for mild pain or moderate pain.  [name] albuterol (VENTOLIN HFA) 108 (90 Base) MCG/ACT inhaler INHALE 1 TO 2 PUFFS INTO THE LUNGS EVERY 6 HOURS AS NEEDED FOR WHEEZING OR SHORTNESS OF BREATH (Patient taking differently: Inhale 1-2 puffs into the lungs every 6 (six) hours as needed for wheezing or shortness of breath. )  . aspirin EC 325 MG EC tablet Take 1 tablet (325 mg total) by mouth daily.  [name] atorvastatin (LIPITOR) 40 MG tablet Take 1 tablet (40 mg total) by mouth daily.  . Blood Glucose Monitoring Suppl (ONETOUCH VERIO) w/Device KIT 1 kit by Does not apply route 2 (two) times daily.  [name] buPROPion (WELLBUTRIN XL) 150 MG 24 hr tablet Take 1 tablet (150 mg total) by mouth daily.  . Cholecalciferol (VITAMIN D3) 50 MCG (2000 UT) capsule Take 1 capsule (2,000 Units total) by mouth  daily.  . clopidogrel (PLAVIX) 75 MG tablet Take 1 tablet (75 mg total) by mouth daily.  . Fluticasone-Umeclidin-Vilant (TRELEGY ELLIPTA) 100-62.5-25 MCG/INH AEPB Inhale 1 puff into the lungs daily.  [name] glucose blood (ONETOUCH VERIO) test strip Use as instructed  . Insulin Pen Needle (BD PEN NEEDLE NANO U/F) 32G X 4 MM MISC USE WITH LANTUS AS DIRECTED  . Lancet Devices (ONE TOUCH DELICA LANCING DEV) MISC 1 Device by Does not apply route 2 (two) times daily.  . meloxicam (MOBIC) 15 MG tablet TAKE 1 TABLET BY MOUTH ONCE DAILY AS NEEDED (Patient not taking: Reported on 10/16/2019)  . metFORMIN (GLUCOPHAGE-XR) 500 MG 24 hr tablet Take 1 tablet (500 mg total) by mouth 2 (two) times daily before a meal.  . nicotine polacrilex (COMMIT) 4 MG lozenge Take 4 mg by mouth as needed for smoking cessation.  Kendra [name] 73Z MISC 1 Device by Does not apply route 2 (two) times daily.  . polyethylene glycol powder (GLYCOLAX/MIRALAX) 17 GM/SCOOP powder Take 17-34 g by mouth daily as needed for mild constipation or moderate constipation.   No facility-administered encounter medications on file as of 11/17/2019.    Goals Addressed            This Visit's Progress   . PharmD-Medication Adherence       Current Barriers:  . Financial Barriers . Knowledge deficits related to coordination of her own care . Limited social support   Pharmacist Clinical Goal(s):   [name] Over the next  30 days, patient will demonstrate improved medication adherence as evidenced by verbalized understanding of prescribed medication regimen, assistance available, and patient report of adherence  Interventions: . Counsel patient on importance of medication adherence ? Reports she is almost due for a refill, but has not yet called the pharmacy as she currently has a bill due and does not receive her monthly check for a couple of days ? Advise patient to call Kendra [name] today about bill and to arrange to receive next refill. Ms.  [name] confirm having number and states that she will call pharmacy now to speak with Kendra [name]. Kendra [name] continuing to receive services from Kendra [name] ? Reports doing well with PT and noticing improvement  . Counsel patient on continued importance of blood pressure monitoring and control ? Note patient remains off of antihypertensive medication  ? Reports HH RN has told her that her BP has been "good" ? Reiterate counseling to resume recording readings in log . Follow up with patient about obtaining new upper arm BP monitor  ? Reports that she has not yet received new OTC card in the mail.  ? Advise patient that if she has not received replacement card in mail today, to call to follow up with health plan. ? Patient verbalizes importance of using benefit to obtain new monitor once she receives this new card in the mail . Counsel on continued importance of blood sugar control ? Reports taking metformin ER 500 mg twice daily from pill pack . Counsel on importance of smoking cessation ? Patient reports currently smoking a total of: 1 cigarette/day  ? States "I take 2 pulls and put it out" a couple of times during the day when she is stressed. ? Discuss strategies to help patient to manage her stress/manage craving ? States that she will also discuss with PT exercise recommendations/limits as walking & other forms of exercise as walking has been a significant source of stress relief in past  Patient Self Care Activities:  . Patient takes medications as directed with aid of adherence tools. o Using pill packaging from [name] . Calls pharmacy for medication refills . Patient to attend scheduled medical appointments o Appointment with PCP on 3/9 o Left carotid endarterectomy procedure scheduled for 3/23 . Calls provider office for new concerns or questions  . Patient to check blood sugars and keep log . Patient to check blood pressure daily and keep log  Please see past  updates related to this goal by clicking on the "Past Updates" button in the selected goal          Plan  The care management team will reach out to the patient again over the next 7 days.   [name], PharmD, [place] [employer] [phone]

## 2019-11-17 NOTE — Patient Instructions (Signed)
Thank you allowing the Chronic Care Management Team to be a part of your care! It was a pleasure speaking with you today!     CCM (Chronic Care Management) Team    [name] RN, MSN, CCM Nurse Care Coordinator  [phone]   [name] PharmD  Clinical Pharmacist  [phone]   [name] LCSW Clinical Social Worker [phone]  Visit Information  Goals Addressed            This Visit's Progress   . PharmD-Medication Adherence       Current Barriers:  . Financial Barriers . Knowledge deficits related to coordination of her own care . Limited social support   Pharmacist Clinical Goal(s):   [name] Over the next 30 days, patient will demonstrate improved medication adherence as evidenced by verbalized understanding of prescribed medication regimen, assistance available, and patient report of adherence  Interventions: . Counsel patient on importance of medication adherence ? Reports she is almost due for a refill, but has not yet called the pharmacy as she currently has a bill due and does not receive her monthly check for a couple of days ? Advise patient to call [name] today about bill and to arrange to receive next refill. Ms. Herlong confirm having number and states that she will call pharmacy now to speak with Sam, [place]. [name] continuing to receive services from [place] [facility] ? Reports doing well with PT and noticing improvement  . Counsel patient on continued importance of blood pressure monitoring and control ? Note patient remains off of antihypertensive medication  ? Reports HH RN has told her that her BP has been "good" ? Reiterate counseling to resume recording readings in log . Follow up with patient about obtaining new upper arm BP monitor  ? Reports that she has not yet received new OTC card in the mail.  ? Advise patient that if she has not received replacement card in mail today, to call to follow up with health plan. ? Patient  verbalizes importance of using benefit to obtain new monitor once she receives this new card in the mail . Counsel on continued importance of blood sugar control ? Reports taking metformin ER 500 mg twice daily from pill pack . Counsel on importance of smoking cessation ? Patient reports currently smoking a total of: 1 cigarette/day  ? States "I take 2 pulls and put it out" a couple of times during the day when she is stressed. ? Discuss strategies to help patient to manage her stress/manage craving ? States that she will also discuss with PT exercise recommendations/limits as walking & other forms of exercise as walking has been a significant source of stress relief in past  Patient Self Care Activities:  . Patient takes medications as directed with aid of adherence tools. o Using pill packaging from [facility] . Calls pharmacy for medication refills . Patient to attend scheduled medical appointments o Appointment with PCP on 3/9 o Left carotid endarterectomy procedure scheduled for 3/23 . Calls provider office for new concerns or questions  . Patient to check blood sugars and keep log . Patient to check blood pressure daily and keep log  Please see past updates related to this goal by clicking on the "Past Updates" button in the selected goal          Patient verbalizes understanding of instructions provided today.   The care management team will reach out to the patient again over  the next 7 days.   [name], PharmD, [place] [employer] [phone]

## 2019-11-19 ENCOUNTER — Telehealth: Payer: Self-pay

## 2019-11-19 ENCOUNTER — Telehealth: Payer: Self-pay | Admitting: Nurse Practitioner

## 2019-11-19 NOTE — Telephone Encounter (Signed)
   11/19/2019  Name: Kendra [name]   MRN: [id number]   DOB: 26-Dec-1945   AGE: 74 y.o.   GENDER: female   PCP [name], NP (Inactive).   Called pt regarding [employer] Referral for meal delivery. Pt has low sodium needs. Signed up for [employer] delivery for her and her son.      [place] [email or web address]  [phone]

## 2019-11-19 NOTE — Telephone Encounter (Signed)
Pt called to confirm address for [facility] for her COVID screening on 3/19. No further questions/concerns at this time.

## 2019-11-19 NOTE — Telephone Encounter (Signed)
  From: Jill Alexanders [facility])  Sent: Wednesday, November 19, 2019 1:51 PM To: '[email or web address]> Subject: Secure: [place] Referral - Kendra A. [facility] - [birth year]   Good Tally [name],  Ms. Ambrosi is a patient at [facility] and is a 74  y/o and lives with her son who is 52.  They are both in need of food. She is on a low sodium diet, I told her that we couldn't promise that each meal could offer low sodium and she was okay with that.  She does not have transportation so she cannot get food from meal pantries unfortunately.  Here is the contact information:  Missey [address]  [address] [place], [place] [address]  Jan 12, 2048 [phone] (H)  Thank you Stanton Kidney, and please let me know if you need anything further!   Paia . Embedded Care Coordination [place]  Care Management ??[email or web address]  ??[phone]

## 2019-11-20 ENCOUNTER — Telehealth: Payer: Self-pay | Admitting: Nurse Practitioner

## 2019-11-20 DIAGNOSIS — Z8673 Personal history of transient ischemic attack (TIA), and cerebral infarction without residual deficits: Secondary | ICD-10-CM

## 2019-11-20 MED ORDER — CLOPIDOGREL BISULFATE 75 MG PO TABS: 75.0000 mg | ORAL_TABLET | Freq: Every day | ORAL | 0 refills | Status: DC

## 2019-11-20 NOTE — Telephone Encounter (Signed)
pharmacy notified who will let patient know.

## 2019-11-20 NOTE — Telephone Encounter (Signed)
Yes add plavix to pill pack. Sent new rx to [facility]  Nobie Putnam, [employer] 11/20/2019, 12:44 PM

## 2019-11-20 NOTE — Telephone Encounter (Signed)
[name] called wanted to know if  You wanted plavix refilled for pt pill pack. [facility] call back # is [phone]

## 2019-11-24 ENCOUNTER — Ambulatory Visit: Payer: Self-pay | Admitting: Pharmacist

## 2019-11-24 ENCOUNTER — Other Ambulatory Visit: Payer: Self-pay | Admitting: Family Medicine

## 2019-11-24 DIAGNOSIS — I10 Essential (primary) hypertension: Secondary | ICD-10-CM

## 2019-11-24 DIAGNOSIS — F17200 Nicotine dependence, unspecified, uncomplicated: Secondary | ICD-10-CM

## 2019-11-24 DIAGNOSIS — Z794 Long term (current) use of insulin: Secondary | ICD-10-CM

## 2019-11-24 DIAGNOSIS — E118 Type 2 diabetes mellitus with unspecified complications: Secondary | ICD-10-CM

## 2019-11-24 NOTE — Chronic Care Management (AMB) (Signed)
Chronic Care Management   Follow Up Note   11/24/2019 Name: Kendra [name] MRN: [id number] DOB: Feb 26, 1946  Referred by: [name], NP (Inactive) Reason for referral : Chronic Care Management (Patient Phone Call)   Kendra [name] is a 74 y.o. year old female who is a primary care patient of [name], NP (Inactive). The CCM team was consulted for assistance with chronic disease management and care coordination needs. Kendra [name] has a medical history which includes but is not limited to recent CVA (11/2018), Type 2 Diabetes, hypertension, hyperlipidemia and COPD.   I reached out to [name] by phone today.   Review of patient status, including review of consultants reports, relevant laboratory and other test results, and collaboration with appropriate care team members and the patient's provider was performed as part of comprehensive patient evaluation and provision of chronic care management services.     Outpatient Encounter Medications as of 11/24/2019  Medication Sig  . albuterol (VENTOLIN HFA) 108 (90 Base) MCG/ACT inhaler INHALE 1 TO 2 PUFFS INTO THE LUNGS EVERY 6 HOURS AS NEEDED FOR WHEEZING OR SHORTNESS OF BREATH (Patient taking differently: Inhale 1-2 puffs into the lungs every 6 (six) hours as needed for wheezing or shortness of breath. )  . clopidogrel (PLAVIX) 75 MG tablet Take 1 tablet (75 mg total) by mouth daily.  . Fluticasone-Umeclidin-Vilant (TRELEGY ELLIPTA) 100-62.5-25 MCG/INH AEPB Inhale 1 puff into the lungs daily.  . metFORMIN (GLUCOPHAGE-XR) 500 MG 24 hr tablet Take 1 tablet (500 mg total) by mouth 2 (two) times daily before a meal.  . acetaminophen (TYLENOL) 500 MG tablet Take 2 tablets (1,000 mg total) by mouth every 8 (eight) hours as needed for mild pain or moderate pain.  [name] aspirin EC 325 MG EC tablet Take 1 tablet (325 mg total) by mouth daily.  [name] atorvastatin (LIPITOR) 40 MG tablet Take 1 tablet (40 mg total) by mouth daily.    . Blood Glucose Monitoring Suppl (ONETOUCH VERIO) w/Device KIT 1 kit by Does not apply route 2 (two) times daily.  [name] buPROPion (WELLBUTRIN XL) 150 MG 24 hr tablet Take 1 tablet (150 mg total) by mouth daily.  . Cholecalciferol (VITAMIN D3) 50 MCG (2000 UT) capsule Take 1 capsule (2,000 Units total) by mouth daily.  [name] glucose blood (ONETOUCH VERIO) test strip Use as instructed  . Insulin Pen Needle (BD PEN NEEDLE NANO U/F) 32G X 4 MM MISC USE WITH LANTUS AS DIRECTED  . Lancet Devices (ONE TOUCH DELICA LANCING DEV) MISC 1 Device by Does not apply route 2 (two) times daily.  . meloxicam (MOBIC) 15 MG tablet TAKE 1 TABLET BY MOUTH ONCE DAILY AS NEEDED  . nicotine polacrilex (COMMIT) 4 MG lozenge Take 4 mg by mouth as needed for smoking cessation.  Kendra [name] Delica Lancets 50D MISC 1 Device by Does not apply route 2 (two) times daily.  . polyethylene glycol powder (GLYCOLAX/MIRALAX) 17 GM/SCOOP powder Take 17-34 g by mouth daily as needed for mild constipation or moderate constipation.   No facility-administered encounter medications on file as of 11/24/2019.    Goals Addressed            This Visit's Progress   . PharmD-Medication Adherence       Current Barriers:  . Financial Barriers . Knowledge deficits related to coordination of her own care . Limited social support   Pharmacist Clinical Goal(s):   [name] Over the next 30 days, patient will demonstrate improved medication  adherence as evidenced by verbalized understanding of prescribed medication regimen, assistance available, and patient report of adherence  Interventions: . Counsel patient on importance of medication adherence ? Reports she received refills of her medications (pill packaged) from [facility] ? Counsel patient on calling pharmacy >4 days prior to needing pill pack delivered . Confirms continuing to receive services from [facility]  . Counsel patient on continued importance of blood pressure monitoring and  control ? Note patient remains off of antihypertensive medication  ? Reports HH RN has told her that her BP has been "good" ? Reiterate counseling to resume recording readings in log . Follow up with patient about obtaining new upper arm BP monitor  ? Reports that she just received new OTC card in the mail.  ? States she will use card to obtain new upper arm monitor at [facility] . Counsel on continued importance of blood sugar control  ? Reports taking metformin ER 500 mg twice daily from pill pack ? Kendra [name] reports that current [employer] meter is cracked and no longer working ? Note One Touch Verio supplies are also preferred under current health plan . Counsel on importance of smoking cessation ? Patient reports currently smoking a total of: 1 cigarette/day  ? States "I take 2 pulls and put it out" a couple of times during the day when she is stressed. ? Discuss strategies to help patient to manage her stress/manage craving ? States discussed her walking route with PT - walking has been a significant source of stress relief in past . Will collaborate with PCP regarding need for new glucometer  Patient Self Care Activities:  . Patient takes medications as directed with aid of adherence tools. o Using pill packaging from [facility] . Calls pharmacy for medication refills . Patient to attend scheduled medical appointments o Left carotid endarterectomy procedure scheduled for 3/23 . Calls provider office for new concerns or questions  . Patient to check blood sugars and keep log . Patient to check blood pressure daily and keep log  Please see past updates related to this goal by clicking on the "Past Updates" button in the selected goal          Plan  The care management team will reach out to the patient again over the next 30 days.   [name], PharmD, [place] [employer] [phone]

## 2019-11-24 NOTE — Patient Instructions (Signed)
Thank you allowing the Chronic Care Management Team to be a part of your care! It was a pleasure speaking with you today!     CCM (Chronic Care Management) Team    [name] RN, MSN, CCM Nurse Care Coordinator  [phone]   [name] PharmD  Clinical Pharmacist  [phone]   [name] LCSW Clinical Social Worker [phone]  Visit Information  Goals Addressed            This Visit's Progress   . PharmD-Medication Adherence       Current Barriers:  . Financial Barriers . Knowledge deficits related to coordination of her own care . Limited social support   Pharmacist Clinical Goal(s):   [name] Over the next 30 days, patient will demonstrate improved medication adherence as evidenced by verbalized understanding of prescribed medication regimen, assistance available, and patient report of adherence  Interventions: . Counsel patient on importance of medication adherence ? Reports she received refills of her medications (pill packaged) from [facility] ? Counsel patient on calling pharmacy >4 days prior to needing pill pack delivered . Confirms continuing to receive services from [facility]  . Counsel patient on continued importance of blood pressure monitoring and control ? Note patient remains off of antihypertensive medication  ? Reports HH RN has told her that her BP has been "good" ? Reiterate counseling to resume recording readings in log . Follow up with patient about obtaining new upper arm BP monitor  ? Reports that she just received new OTC card in the mail.  ? States she will use card to obtain new upper arm monitor at [facility] . Counsel on continued importance of blood sugar control  ? Reports taking metformin ER 500 mg twice daily from pill pack ? Ms. Salido reports that current [employer] meter is cracked and no longer working ? Note One Touch Verio supplies are also preferred under current health plan . Counsel on importance of  smoking cessation ? Patient reports currently smoking a total of: 1 cigarette/day  ? States "I take 2 pulls and put it out" a couple of times during the day when she is stressed. ? Discuss strategies to help patient to manage her stress/manage craving ? States discussed her walking route with PT - walking has been a significant source of stress relief in past . Will collaborate with PCP regarding need for new glucometer  Patient Self Care Activities:  . Patient takes medications as directed with aid of adherence tools. o Using pill packaging from [facility] . Calls pharmacy for medication refills . Patient to attend scheduled medical appointments o Left carotid endarterectomy procedure scheduled for 3/23 . Calls provider office for new concerns or questions  . Patient to check blood sugars and keep log . Patient to check blood pressure daily and keep log  Please see past updates related to this goal by clicking on the "Past Updates" button in the selected goal          Patient verbalizes understanding of instructions provided today.   The care management team will reach out to the patient again over the next 30 days.   [name], PharmD, [place] [employer] [phone]

## 2019-11-24 NOTE — Progress Notes (Signed)
Glucometer order sent to pharmacy

## 2019-11-25 ENCOUNTER — Ambulatory Visit: Payer: Medicare Other | Admitting: Family Medicine

## 2019-12-02 ENCOUNTER — Encounter: Payer: Self-pay | Admitting: Family Medicine

## 2019-12-02 ENCOUNTER — Ambulatory Visit: Payer: Self-pay | Admitting: Licensed Clinical Social Worker

## 2019-12-02 DIAGNOSIS — F324 Major depressive disorder, single episode, in partial remission: Secondary | ICD-10-CM

## 2019-12-02 DIAGNOSIS — Z794 Long term (current) use of insulin: Secondary | ICD-10-CM

## 2019-12-02 DIAGNOSIS — E118 Type 2 diabetes mellitus with unspecified complications: Secondary | ICD-10-CM

## 2019-12-02 DIAGNOSIS — J449 Chronic obstructive pulmonary disease, unspecified: Secondary | ICD-10-CM

## 2019-12-02 DIAGNOSIS — I10 Essential (primary) hypertension: Secondary | ICD-10-CM

## 2019-12-02 NOTE — Chronic Care Management (AMB) (Addendum)
Chronic Care Management    Clinical Social Work Follow Up Note  12/02/2019 Name: Kendra [name] MRN: [id number] DOB: 1946/05/24  Kendra [name] is a 74 y.o. year old female who is a primary care patient of [name], NP (Inactive). The CCM team was consulted for assistance with [employer] and [employer] .   Review of patient status, including review of consultants reports, other relevant assessments, and collaboration with appropriate care team members and the patient's provider was performed as part of comprehensive patient evaluation and provision of chronic care management services.    SDOH (Social Determinants of Health) assessments performed: Yes    Outpatient Encounter Medications as of 12/02/2019  Medication Sig  . acetaminophen (TYLENOL) 500 MG tablet Take 2 tablets (1,000 mg total) by mouth every 8 (eight) hours as needed for mild pain or moderate pain.  [name] albuterol (VENTOLIN HFA) 108 (90 Base) MCG/ACT inhaler INHALE 1 TO 2 PUFFS INTO THE LUNGS EVERY 6 HOURS AS NEEDED FOR WHEEZING OR SHORTNESS OF BREATH (Patient taking differently: Inhale 1-2 puffs into the lungs every 6 (six) hours as needed for wheezing or shortness of breath. )  . aspirin EC 325 MG EC tablet Take 1 tablet (325 mg total) by mouth daily.  [name] atorvastatin (LIPITOR) 40 MG tablet Take 1 tablet (40 mg total) by mouth daily.  . Blood Glucose Monitoring Suppl (ONETOUCH VERIO) w/Device KIT 1 kit by Does not apply route 2 (two) times daily.  [name] buPROPion (WELLBUTRIN XL) 150 MG 24 hr tablet Take 1 tablet (150 mg total) by mouth daily.  . Cholecalciferol (VITAMIN D3) 50 MCG (2000 UT) capsule Take 1 capsule (2,000 Units total) by mouth daily.  . clopidogrel (PLAVIX) 75 MG tablet Take 1 tablet (75 mg total) by mouth daily. (Patient taking differently: Take 75 mg by mouth daily in the afternoon. )  . Fluticasone-Umeclidin-Vilant (TRELEGY ELLIPTA) 100-62.5-25 MCG/INH AEPB Inhale 1 puff into the lungs  daily.  [name] glucose blood (ONETOUCH VERIO) test strip Use as instructed  . Insulin Pen Needle (BD PEN NEEDLE NANO U/F) 32G X 4 MM MISC USE WITH LANTUS AS DIRECTED  . Lancet Devices (ONE TOUCH DELICA LANCING DEV) MISC 1 Device by Does not apply route 2 (two) times daily.  . meloxicam (MOBIC) 15 MG tablet TAKE 1 TABLET BY MOUTH ONCE DAILY AS NEEDED (Patient not taking: Reported on 11/28/2019)  . metFORMIN (GLUCOPHAGE-XR) 500 MG 24 hr tablet Take 1 tablet (500 mg total) by mouth 2 (two) times daily before a meal.  . nicotine polacrilex (COMMIT) 4 MG lozenge Take 4 mg by mouth as needed for smoking cessation.  [name] Lancets 61Y MISC 1 Device by Does not apply route 2 (two) times daily.  . polyethylene glycol powder (GLYCOLAX/MIRALAX) 17 GM/SCOOP powder Take 17-34 g by mouth daily as needed for mild constipation or moderate constipation. (Patient not taking: Reported on 11/28/2019)   No facility-administered encounter medications on file as of 12/02/2019.     Goals Addressed    . "I probably could benefit from more support." (pt-stated)       Current Barriers:  . Financial constraints . Limited social support . Housing barriers . Level of care concerns . ADL IADL limitations . Family and relationship dysfunction . Social Isolation . Limited education about housing resources available in [facility] as well as LTC placement options* . Limited access to caregiver . LIMITED FOOD ACCESS  Clinical Social Work Clinical Goal(s):  [name] Over the  next 90 days, client will work with SW to address concerns related to lack of overall support/resources/healthy meals within the home . Over the next 90 days, patient will work with LCSW to address needs related to implementing appropriate self-care into her daily routine to promote healthy living.  Interventions: . Patient interviewed and appropriate assessments performed . Provided patient with information about LTC placement as patient already  has Medicaid secondary insurance coverage. LCSW also provided patient with education on available transportation resources through [facility] and [facility]. Patient will need to contact her Medicaid caseworker at [place] to notify them that she wishes to sign up for transportation services. Patient reports that her son is currently providing stable transportation for her to all of her medical appointments. Patient has still not contacted Medicaid caseworker to set up transportation services and was encouraged to do so today. . Discussed plans with patient for ongoing care management follow up and provided patient with direct contact information for care management team . Advised patient to contact CCM providers if she has any concerns in regards to her safety or well-being. Patient confirms that her son manages all of her finances and she declines having any issues with this at this time.  . Assisted patient/caregiver with obtaining information about health plan benefits . Provided education and assistance to client regarding Advanced Directives. . Provided education to patient/caregiver regarding level of care options. . Patient confirms to struggle with ongoing headaches which causes her stress/pain/fatigue. Emotional support provided and patient was receptive to this. .  Patient confirms that her headaches have finally started to improve since her PCP recently changed her medications. Positive reinforcement provided for this achievement. . Patient reports that her ramp is still effectively working for her. . Patient declines any urgent case management needs at this time. Patient confirms having food at this time. Referrals made in the past for food insecurity.  .  Patient Self Care Activities:  . Attends all scheduled provider appointments . Calls provider office for new concerns or questions  Please see past updates related to this goal by clicking on the "Past Updates" button in the selected goal       Follow Up Plan: SW will follow up with patient by phone over the next quarter  [name], [place], MSW, [email or web address] Phone: [phone]

## 2019-12-04 NOTE — Progress Notes (Signed)
[place], [place] [address] Phone: [phone] Fax: [phone]  [id number] - [place], [address] [address] Phone: [phone] Fax: [phone]      Your procedure is scheduled on Tuesday, December 09, 2019.  Report to [facility] Main Entrance "A" at 5:30 A.M., and check in at the Admitting office.  Call this number if you have problems the morning of surgery:  [phone]  Call [phone] if you have any questions prior to your surgery date Monday-Friday 8am-4pm    Remember:  Do not eat or drink after midnight the night before your surgery     Take these medicines the morning of surgery with A SIP OF WATER: atorvastatin (LIPITOR) buPROPion (WELLBUTRIN XL)  Fluticasone-Umeclidin-Vilant (TRELEGY ELLIPTA) acetaminophen (TYLENOL) - if needed albuterol (VENTOLIN HFA) - if needed  Follow your surgeon's instructions on when to stop ASPIRIN and PLAVIX.  If no instructions were given by your surgeon then you will need to call the office to get those instructions.    Prior to surgery STOP taking any Aleve, Naproxen, Ibuprofen, Motrin, Advil, Goody's, BC's, all herbal medications, fish oil, and all vitamins.   WHAT DO I DO ABOUT MY DIABETES MEDICATION?   [name] Do not take oral diabetes medicines (metFORMIN (GLUCOPHAGE-XR)) the morning of surgery.   HOW TO MANAGE YOUR DIABETES BEFORE AND AFTER SURGERY  Why is it important to control my blood sugar before and after surgery? . Improving blood sugar levels before and after surgery helps healing and can limit problems. . A way of improving blood sugar control is eating a healthy diet by: o  Eating less sugar and carbohydrates o  Increasing activity/exercise o  Talking with your doctor about reaching your blood sugar goals . High blood sugars (greater than 180 mg/dL) can raise your risk of infections and slow your  recovery, so you will need to focus on controlling your diabetes during the weeks before surgery. . Make sure that the doctor who takes care of your diabetes knows about your planned surgery including the date and location.  How do I manage my blood sugar before surgery? . Check your blood sugar at least 4 times a day, starting 2 days before surgery, to make sure that the level is not too high or low. . Check your blood sugar the morning of your surgery when you wake up and every 2 hours until you get to the Short Stay unit. o If your blood sugar is less than 70 mg/dL, you will need to treat for low blood sugar: - Do not take insulin. - Treat a low blood sugar (less than 70 mg/dL) with  cup of clear juice (cranberry or apple), 4 glucose tablets, OR glucose gel. - Recheck blood sugar in 15 minutes after treatment (to make sure it is greater than 70 mg/dL). If your blood sugar is not greater than 70 mg/dL on recheck, call [phone] for further instructions. . Report your blood sugar to the short stay nurse when you get to Short Stay.  . If you are admitted to the hospital after surgery: o Your blood sugar will be checked by the staff and you will probably be given insulin after surgery (instead of oral diabetes medicines) to make sure you have good blood sugar levels. o The goal for blood sugar control after surgery is 80-180 mg/dL.    The Morning  of Surgery  Do not wear jewelry, make-up or nail polish.  Do not wear lotions, powders, perfumes, or deodorant  Do not shave 48 hours prior to surgery.  Do not bring valuables to the hospital.  [facility] is not responsible for any belongings or valuables.  If you are a smoker, DO NOT Smoke 24 hours prior to surgery  If you wear a CPAP at night please bring your mask the morning of surgery   Remember that you must have someone to transport you home after your surgery, and remain with you for 24 hours if you are discharged the same  day.   Please bring cases for contacts, glasses, hearing aids, dentures or bridgework because it cannot be worn into surgery.    Leave your suitcase in the car.  After surgery it may be brought to your room.  For patients admitted to the hospital, discharge time will be determined by your treatment team.  Patients discharged the day of surgery will not be allowed to drive home.    Special instructions:   [place]- Preparing For Surgery  Before surgery, you can play an important role. Because skin is not sterile, your skin needs to be as free of germs as possible. You can reduce the number of germs on your skin by washing with CHG (chlorahexidine gluconate) Soap before surgery.  CHG is an antiseptic cleaner which kills germs and bonds with the skin to continue killing germs even after washing.    Oral Hygiene is also important to reduce your risk of infection.  Remember - BRUSH YOUR TEETH THE MORNING OF SURGERY WITH YOUR REGULAR TOOTHPASTE  Please do not use if you have an allergy to CHG or antibacterial soaps. If your skin becomes reddened/irritated stop using the CHG.  Do not shave (including legs and underarms) for at least 48 hours prior to first CHG shower. It is OK to shave your face.  Please follow these instructions carefully.   1. Shower the NIGHT BEFORE SURGERY and the MORNING OF SURGERY with CHG Soap.   2. If you chose to wash your hair, wash your hair first as usual with your normal shampoo.  3. After you shampoo, rinse your hair and body thoroughly to remove the shampoo.  4. Use CHG as you would any other liquid soap. You can apply CHG directly to the skin and wash gently with a scrungie or a clean washcloth.   5. Apply the CHG Soap to your body ONLY FROM THE NECK DOWN.  Do not use on open wounds or open sores. Avoid contact with your eyes, ears, mouth and genitals (private parts). Wash Face and genitals (private parts)  with your normal soap.   6. Wash thoroughly,  paying special attention to the area where your surgery will be performed.  7. Thoroughly rinse your body with warm water from the neck down.  8. DO NOT shower/wash with your normal soap after using and rinsing off the CHG Soap.  9. Pat yourself dry with a CLEAN TOWEL.  10. Wear CLEAN PAJAMAS to bed the night before surgery, wear comfortable clothes the morning of surgery  11. Place CLEAN SHEETS on your bed the night of your first shower and DO NOT SLEEP WITH PETS.    Day of Surgery:  Please shower the morning of surgery with the CHG soap Do not apply any deodorants/lotions. Please wear clean clothes to the hospital/surgery center.   Remember to brush your teeth WITH YOUR REGULAR TOOTHPASTE.  Please read over the following fact sheets that you were given.

## 2019-12-05 ENCOUNTER — Other Ambulatory Visit (HOSPITAL_COMMUNITY)
Admission: RE | Admit: 2019-12-05 | Discharge: 2019-12-05 | Disposition: A | Discharge status: Home / Self Care | Payer: Medicare Other | Source: Ambulatory Visit | Attending: Vascular Surgery | Admitting: Vascular Surgery

## 2019-12-05 ENCOUNTER — Telehealth: Payer: Self-pay

## 2019-12-05 ENCOUNTER — Inpatient Hospital Stay (HOSPITAL_COMMUNITY)
Admission: RE | Admit: 2019-12-05 | Discharge: 2019-12-05 | Disposition: A | Discharge status: Home / Self Care | Payer: Medicare Other | Source: Ambulatory Visit

## 2019-12-05 DIAGNOSIS — Z20822 Contact with and (suspected) exposure to covid-19: Secondary | ICD-10-CM | POA: Diagnosis not present

## 2019-12-05 DIAGNOSIS — Z01812 Encounter for preprocedural laboratory examination: Secondary | ICD-10-CM | POA: Insufficient documentation

## 2019-12-05 LAB — SARS CORONAVIRUS 2 (TAT 6-24 HRS): SARS Coronavirus 2: NEGATIVE

## 2019-12-05 NOTE — Telephone Encounter (Signed)
Pt called with c/o left leg heaviness and was told by a Alvis Lemmings nurse that she had "poor circulation". She is scheduled for a L CEA this Tuesday. I encouraged her to go to the ED if she felt that was necessary. Pt wants to call us back after her surgery and schedule an appt for this to be assessed if it persists. She verbalized understanding.

## 2019-12-08 ENCOUNTER — Ambulatory Visit: Payer: Self-pay | Admitting: General Practice

## 2019-12-08 ENCOUNTER — Encounter (HOSPITAL_COMMUNITY): Payer: Self-pay | Admitting: Vascular Surgery

## 2019-12-08 ENCOUNTER — Other Ambulatory Visit: Payer: Self-pay

## 2019-12-08 ENCOUNTER — Telehealth: Payer: Self-pay | Admitting: General Practice

## 2019-12-08 DIAGNOSIS — E118 Type 2 diabetes mellitus with unspecified complications: Secondary | ICD-10-CM

## 2019-12-08 DIAGNOSIS — I10 Essential (primary) hypertension: Secondary | ICD-10-CM

## 2019-12-08 DIAGNOSIS — Z794 Long term (current) use of insulin: Secondary | ICD-10-CM

## 2019-12-08 NOTE — Chronic Care Management (AMB) (Signed)
Chronic Care Management   Follow Up Note   12/08/2019 Name: Kendra [name] MRN: [id number] DOB: [birth year]  Referred by: [name], NP (Inactive) Reason for referral : Chronic Care Management (follow up on HTN/DM and R endarectomy/patient having left endarectomy on 12/09/2019)   Kendra [name] is a 74 y.o. year old female who is a primary care patient of [name], NP (Inactive). The CCM team was consulted for assistance with chronic disease management and care coordination needs.    Review of patient status, including review of consultants reports, relevant laboratory and other test results, and collaboration with appropriate care team members and the patient's provider was performed as part of comprehensive patient evaluation and provision of chronic care management services.    SDOH (Social Determinants of Health) assessments performed: Yes See Care Plan activities for detailed interventions related to [name])     Outpatient Encounter Medications as of 12/08/2019  Medication Sig  . acetaminophen (TYLENOL) 500 MG tablet Take 2 tablets (1,000 mg total) by mouth every 8 (eight) hours as needed for mild pain or moderate pain.  [name] albuterol (VENTOLIN HFA) 108 (90 Base) MCG/ACT inhaler INHALE 1 TO 2 PUFFS INTO THE LUNGS EVERY 6 HOURS AS NEEDED FOR WHEEZING OR SHORTNESS OF BREATH (Patient taking differently: Inhale 1-2 puffs into the lungs every 6 (six) hours as needed for wheezing or shortness of breath. )  . aspirin EC 325 MG EC tablet Take 1 tablet (325 mg total) by mouth daily.  [name] atorvastatin (LIPITOR) 40 MG tablet Take 1 tablet (40 mg total) by mouth daily.  . Blood Glucose Monitoring Suppl (ONETOUCH VERIO) w/Device KIT 1 kit by Does not apply route 2 (two) times daily.  [name] buPROPion (WELLBUTRIN XL) 150 MG 24 hr tablet Take 1 tablet (150 mg total) by mouth daily.  . Cholecalciferol (VITAMIN D3) 50 MCG (2000 UT) capsule Take 1 capsule (2,000 Units total) by mouth  daily.  . clopidogrel (PLAVIX) 75 MG tablet Take 1 tablet (75 mg total) by mouth daily. (Patient taking differently: Take 75 mg by mouth daily in the afternoon. )  . Fluticasone-Umeclidin-Vilant (TRELEGY ELLIPTA) 100-62.5-25 MCG/INH AEPB Inhale 1 puff into the lungs daily.  [name] glucose blood (ONETOUCH VERIO) test strip Use as instructed  . Insulin Pen Needle (BD PEN NEEDLE NANO U/F) 32G X 4 MM MISC USE WITH LANTUS AS DIRECTED  . Lancet Devices (ONE TOUCH DELICA LANCING DEV) MISC 1 Device by Does not apply route 2 (two) times daily.  . meloxicam (MOBIC) 15 MG tablet TAKE 1 TABLET BY MOUTH ONCE DAILY AS NEEDED (Patient not taking: Reported on 11/28/2019)  . metFORMIN (GLUCOPHAGE-XR) 500 MG 24 hr tablet Take 1 tablet (500 mg total) by mouth 2 (two) times daily before a meal.  . nicotine polacrilex (COMMIT) 4 MG lozenge Take 4 mg by mouth as needed for smoking cessation.  [name] Lancets 28B MISC 1 Device by Does not apply route 2 (two) times daily.  . polyethylene glycol powder (GLYCOLAX/MIRALAX) 17 GM/SCOOP powder Take 17-34 g by mouth daily as needed for mild constipation or moderate constipation. (Patient not taking: Reported on 11/28/2019)   No facility-administered encounter medications on file as of 12/08/2019.     Objective:   Goals Addressed            This Visit's Progress   . COMPLETED: RNCM-"I need some help with my sugar" (pt-stated)       Current Barriers:  [name] Knowledge Deficits related  to diabetes management  Nurse Case Manager Clinical Goal(s):  [name] Over the next 120 days, patient will verbalize understanding of plan for diabetes management . Over the next 120 days, patient will demonstrate improved health management independence as evidenced byperforming self health management activities such as checking cbg's/recording, working with CM team to address needs around chronic disease management  Interventions:  . Evaluation of current treatment plan related to diabetes  management and patient's adherence to plan as established by provider. . Patient's A1c 6.0, restarted on Metformin XR . Patient stated she is not checking blood sugars at this time  . Continues to work with CCM pharmacist with pill packaging  Patient Self Care Activities:  . Currently UNABLE TO independently self manage diabetes . Performs ADL's independently . Calls provider office for new concerns or questions  Please see past updates related to this goal by clicking on the "Past Updates" button in the selected goal      . RNCM-My headaches are worse, my blood pressure is up and down (pt-stated)       Current Barriers:  [name] Knowledge Deficits related to basic understanding of hypertension pathophysiology and self care management . Knowledge Deficits related to understanding of medications prescribed for management of hypertension . Non-adherence to prescribed medication regimen . Limited ability to follow a low sodium diet . Film/video editor.  . Transportation barrier  Case Manager Clinical Goal(s):  [name] Over the next 90 days, patient will demonstrate improved adherence to prescribed treatment plan for hypertension as evidenced by taking all medications as prescribed, monitoring and recording blood pressure as directed, adhering to low sodium/DASH diet . Over the next 90 days the patient will utilize the [employer]it with the ability to prepare heart healthy/low sodium diet in home setting . Over the next 30 days the patient will have follow up appointments with pcp and surgeon post right endarterectomy and hospitalization recently  Interventions:  . Evaluation of current treatment plan related to hypertension self management and patient's adherence to plan as established by provider. . Discussed plans with patient for ongoing care management follow up and provided patient with direct contact information for care management team . Advised patient, providing education and  rationale, to monitor blood pressure daily and record, the patient stated the pcp told her to go to the ED for SBP>199, see updated vital sign record for readings over the last several days . Advised patient to call [facility] for the "Healthy Foods Benefit" ($55.00) credit per month for buying food  . Verbal order received from [name] for [facility] nurse to continue to come in and assess/evaluate the patient post hospitalization for elevated blood pressure and other healthcare needs . Patient continues to check b/p BID and is following pharmacy recommendations to recheck after 72mns if it is high. The patient is keeping a log and can tell the RNCM her readings . Evaluated heart healthy diet with the patient. The patient verbalized she will be getting 15 meals a week that are low sodium and this switch will take place at the end of the week.  . Evaluated patients stress level as she is going in to have left endarterectomy on 12/09/2019.  The patient is trying to get ready for that while also dealing with a note from her landlord saying she is going to be evicted. The patient was supposed to go to the [employer] fill out paperwork. The patient states they will not let her son  do the paperwork for her. Secure message sent to LCSW and the team for recommendations and support. Will continue to monitor.  . Care guide referral for assistance and help with eviction notice.  BP Readings from Last 3 Encounters:  11/14/19 110/71  10/28/19 (!) 149/113  10/23/19 (!) 108/59         Patient Self Care Activities:  . UNABLE to independently manage worsening headaches . Checks BP and records as discussed  Please see past updates related to this goal by clicking on the "Past Updates" button in the selected goal           Plan:   The care management team will reach out to the patient again over the next 14 to 30 days days.    [name] RN, MSN, [place] Mobile: [phone]

## 2019-12-08 NOTE — Patient Instructions (Signed)
Visit Information  Goals Addressed            This Visit's Progress   . COMPLETED: RNCM-"I need some help with my sugar" (pt-stated)       Current Barriers:  [name] Knowledge Deficits related to diabetes management  Nurse Case Manager Clinical Goal(s):  [name] Over the next 120 days, patient will verbalize understanding of plan for diabetes management . Over the next 120 days, patient will demonstrate improved health management independence as evidenced byperforming self health management activities such as checking cbg's/recording, working with CM team to address needs around chronic disease management  Interventions:  . Evaluation of current treatment plan related to diabetes management and patient's adherence to plan as established by provider. . Patient's A1c 6.0, restarted on Metformin XR . Patient stated she is not checking blood sugars at this time  . Continues to work with CCM pharmacist with pill packaging  Patient Self Care Activities:  . Currently UNABLE TO independently self manage diabetes . Performs ADL's independently . Calls provider office for new concerns or questions  Please see past updates related to this goal by clicking on the "Past Updates" button in the selected goal      . RNCM-My headaches are worse, my blood pressure is up and down (pt-stated)       Current Barriers:  [name] Knowledge Deficits related to basic understanding of hypertension pathophysiology and self care management . Knowledge Deficits related to understanding of medications prescribed for management of hypertension . Non-adherence to prescribed medication regimen . Limited ability to follow a low sodium diet . Film/video editor.  . Transportation barrier  Case Manager Clinical Goal(s):  [name] Over the next 90 days, patient will demonstrate improved adherence to prescribed treatment plan for hypertension as evidenced by taking all medications as prescribed, monitoring and recording blood pressure as  directed, adhering to low sodium/DASH diet . Over the next 90 days the patient will utilize the [employer] with the ability to prepare heart healthy/low sodium diet in home setting . Over the next 30 days the patient will have follow up appointments with pcp and surgeon post right endarterectomy and hospitalization recently  Interventions:  . Evaluation of current treatment plan related to hypertension self management and patient's adherence to plan as established by provider. . Discussed plans with patient for ongoing care management follow up and provided patient with direct contact information for care management team . Advised patient, providing education and rationale, to monitor blood pressure daily and record, the patient stated the pcp told her to go to the ED for SBP>199, see updated vital sign record for readings over the last several days . Advised patient to call [name] for the "Healthy Foods Benefit" ($55.00) credit per month for buying food  . Verbal order received from [name] for [facility] nurse to continue to come in and assess/evaluate the patient post hospitalization for elevated blood pressure and other healthcare needs . Patient continues to check b/p BID and is following pharmacy recommendations to recheck after 81mins if it is high. The patient is keeping a log and can tell the RNCM her readings . Evaluated heart healthy diet with the patient. The patient verbalized she will be getting 15 meals a week that are low sodium and this switch will take place at the end of the week.  . Evaluated patients stress level as she is going in to have left endarterectomy on 12/09/2019.  The patient is trying to get  ready for that while also dealing with a note from her landlord saying she is going to be evicted. The patient was supposed to go to the [employer] and fill out paperwork. The patient states they will not let her son do the paperwork for her. Secure  message sent to LCSW and the team for recommendations and support. Will continue to monitor.  . Care guide referral for assistance and help with eviction notice.  BP Readings from Last 3 Encounters:  11/14/19 110/71  10/28/19 (!) 149/113  10/23/19 (!) 108/59         Patient Self Care Activities:  . UNABLE to independently manage worsening headaches . Checks BP and records as discussed  Please see past updates related to this goal by clicking on the "Past Updates" button in the selected goal          Patient verbalizes understanding of instructions provided today.   The care management team will reach out to the patient again over the next 14 to 30 days.   [name] RN, MSN, [place] Mobile: [phone]

## 2019-12-08 NOTE — Progress Notes (Signed)
SDW  PCP - Harlin Rain, FNP Cardiologist - denies, per patient "does not see one currently, but was seen by one at [facility] about a year ago"  PPM/ICD - denies  Chest x-ray - N/A EKG - 10/16/2019 Stress Test - denies ECHO - 10/17/2019 Cardiac Cath - denies  Sleep Study -denies CPAP - N/A  Fasting Blood Sugar - 89 -262 Checks Blood Sugar 1-2 times a day  Aspirin andBlood Thinner Instructions: PLAVIX: per patient, "told by surgeon to continue taking"   ERAS Protcol - No  COVID TEST- 12/05/2019, negative. Patient stated, have been inside of home has been quarantined.  Anesthesia review: YES, heart hx  Patient denies shortness of breath, fever, cough and chest pain over pre-op phone call.  All instructions explained to the patient, with a verbal understanding of the material.  Patient given opportunity to ask questions. Patient provided verbal understanding of pre-op instructions given.

## 2019-12-08 NOTE — Anesthesia Preprocedure Evaluation (Deleted)
Anesthesia Evaluation    Airway        Dental   Pulmonary sleep apnea , COPD, Current Smoker,           Cardiovascular hypertension, Pt. on medications   Normal EF. Valves okay   Neuro/Psych  Neuromuscular disease CVA    GI/Hepatic Neg liver ROS, GERD  ,  Endo/Other  diabetes, Type 2, Oral Hypoglycemic Agents  Renal/GU negative Renal ROS     Musculoskeletal  (+) Arthritis ,   Abdominal   Peds  Hematology negative hematology ROS (+)   Anesthesia Other Findings   Reproductive/Obstetrics                            Anesthesia Physical Anesthesia Plan  ASA: III  Anesthesia Plan: General   Post-op Pain Management:    Induction: Intravenous  PONV Risk Score and Plan: 2 and Dexamethasone, Ondansetron and Treatment may vary due to age or medical condition  Airway Management Planned: Oral ETT  Additional Equipment: Arterial line  Intra-op Plan:   Post-operative Plan: Extubation in OR  Informed Consent:   Plan Discussed with:   Anesthesia Plan Comments: (2IV's, a-line, remi gtt.  )       Anesthesia Quick Evaluation

## 2019-12-08 NOTE — Progress Notes (Signed)
Anesthesia Chart Review: SAME DAY WORK-UP (it appears she missed her 12/05/19 PAT appointment)   Case: [id number] Date/Time: 12/09/19 0715   Procedure: ENDARTERECTOMY CAROTID (Left )   Anesthesia type: General   Pre-op diagnosis: LEFT CAROTID STENOSIS   Location: Kendra [name] 10 / [place] OR   Surgeons: [name], MD      DISCUSSION: Patient is a 74 year old female scheduled for the above procedure. She is s/p right carotid endarterectomy 10/21/19 during admission for right brain TIA likely due to right ICA stenosis, but also had left ICA stenosis that neurology and vascular surgery felt would need to be addressed.   History includes smoking, COPD, DM2 (with neuropathy), GERD, OSA (not using CPAP), GERD, HLD, RA, insomnia, CVA (09/01/14; ?TIA 05/25/15; 12/09/18 s/p mechanical thrombectomy right M1 and A2 thrombus; right brain TIA likely due to right ICA stenosis 10/16/19), carotid artery stenosis (s/p right CEA 10/21/19).  She has been on ASA and Plavix, which Dr. Donzetta Matters had her continue for right CEA last month.  She is a same day work-up, so she will get labs and anesthesia team evaluation on the day of surgery. 11/1919 COVID-19 test was negative.   VS:  BP Readings from Last 3 Encounters:  11/14/19 110/71  10/28/19 (!) 149/113  10/23/19 (!) 108/59   Pulse Readings from Last 3 Encounters:  11/14/19 79  10/28/19 82  10/23/19 61    PROVIDERS: [name], NP (Inactive). Last outpatient evaluation 09/22/19 with Nobie Putnam, DO 09/22/19.  [name], MD is neurologist with [name]. She sawa Kendra Hawking, MD during 10/2018 [facility] admission.   LABS: She is for labs on arrival. As of 10/22/19, lab results included: Lab Results  Component Value Date   WBC 6.4 10/22/2019   HGB 10.8 (L) 10/22/2019   HCT 31.9 (L) 10/22/2019   PLT 259 10/22/2019   GLUCOSE 165 (H) 10/22/2019   ALT 26 10/16/2019   AST 28 10/16/2019   NA 139 10/22/2019   K 3.7 10/22/2019   CL  108 10/22/2019   CREATININE 0.73 10/22/2019   BUN 8 10/22/2019   CO2 23 10/22/2019   INR 1.1 10/16/2019   HGBA1C 6.2 (H) 10/17/2019    IMAGES: MRI Brain 10/17/19: IMPRESSION: 1. No acute finding. 2. Large remote right MCA distribution infarct. 3. Chronic small vessel ischemia most notably affecting the pons.  CTA head/neck with Brain Perfusion 10/16/19: IMPRESSION: 1. Positive for Right MCA M2 middle division near-occlusion/critical stenosis (series 515, image 8). The MCA branches distal to the this are attenuated. However, CTP detects no infarct core and only 10 mL of Right MCA territory penumbra. Plus the plain CT appearance is that of chronic right MCA ischemia. Therefore, suspect this constellation reflects chronic more so than acute Right MCA vessel disease. 2. And furthermore, there are bilateral cervical ICA and ICA siphon hemodynamically significant stenoses: - Right ICA origin 60-65% stenosis. - Left carotid bifurcation RADIOGRAPHIC STRING SIGN STENOSIS. - Moderate bilateral ICA siphon stenosis due to calcified plaque. 3. Negative vertebral and posterior circulation. 4. Aortic Atherosclerosis (ICD10-I70.0) and Emphysema (ICD10-J43.9).   EKG: 10/16/19: SR, anteroseptal infarct (old), borderline repolarization abnormality. Baseline wanderer with some artifact in limb leads.    CV: Carotid US 10/17/19: Summary:  Right Carotid: Velocities in the right ICA are consistent with a 40-59%         stenosis.  Left Carotid: Velocities in the left ICA are consistent with a 60-79%  stenosis.  Vertebrals: Bilateral vertebral arteries demonstrate  antegrade flow.  Subclavians: Normal flow hemodynamics were seen in bilateral subclavian        arteries.    Echo 10/17/19: IMPRESSIONS   1. Left ventricular ejection fraction, by visual estimation, is 60 to  65%. The left ventricle has normal function. There is mildly increased  left ventricular hypertrophy.  2.  The left ventricle has no regional wall motion abnormalities.  3. Global right ventricle has normal systolic function.The right  ventricular size is normal. No increase in right ventricular wall  thickness.  4. Left atrial size was normal.  5. Right atrial size was normal.  6. The mitral valve is normal in structure. Mild mitral valve  regurgitation. No evidence of mitral stenosis.  7. The tricuspid valve is normal in structure.  8. The tricuspid valve is normal in structure. Tricuspid valve  regurgitation is trivial.  9. The aortic valve is normal in structure. Aortic valve regurgitation is  not visualized. Mild aortic valve sclerosis without stenosis.  10. The pulmonic valve was normal in structure. Pulmonic valve  regurgitation is trivial.  11. Normal pulmonary artery systolic pressure.  12. The inferior vena cava is normal in size with greater than 50%  respiratory variability, suggesting right atrial pressure of 3 mmHg.    Zio Patch 01/24/19-02/07/19: Preliminary Findings: Patient had a minimum heart rate of 43 bpm, maximum heart rate of 152 bpm, and average heart rate of 67 bpm.  Predominant underlying rhythm was sinus rhythm, 1 run of ventricular tachycardia occurred lasting 9 beats with a max rate of 129 bpm (average 117 bpm).  2 supraventricular tachycardia runs occurred with the fastest interval lasting 15 beats with a maximum rate of 152 bpm (average 136 bpm); the run with the fastest interval was also the longest.  Isolated SVE's were rare (<1.0%), SVE couplets were rare (<1.0%), and SVE triplets were rare (<1.0%).  Isolated VE's were rare (<1.0%), and no VE couplets or VE triplets were present.   Past Medical History:  Diagnosis Date  . Back pain   . Cataract   . Cervical disc disorder   . COPD (chronic obstructive pulmonary disease) ([place])   . Depression   . Diabetes ([place])   . Diabetic neuropathy ([place])   . GERD (gastroesophageal reflux disease)   . Hyperlipidemia    . Hypertension    per patient "take meds for it"  . Insomnia   . Neuropathy   . Osteoarthritis   . Osteopenia   . Reflux   . Rheumatoid arthritis ([place])   . Sleep apnea    no CPAP and no suggestion that she needed one per pt  . Stroke ([place]) 2015   and again 2017  - Weakness in left leg, staggers w/ walking  . Tenosynovitis     Past Surgical History:  Procedure Laterality Date  . BREAST BIOPSY    . ENDARTERECTOMY Right 10/21/2019   Procedure: ENDARTERECTOMY CAROTID RIGHT;  Surgeon: [name], MD;  Location: [place];  Service: Vascular;  Laterality: Right;  . NECK SURGERY    . OVARIAN CYST REMOVAL    . PATCH ANGIOPLASTY Right 10/21/2019   Procedure: Patch Angioplasty Using XenoSure Biologic Patch Right Carotid;  Surgeon: [name], MD;  Location: [place];  Service: Vascular;  Laterality: Right;    MEDICATIONS: No current facility-administered medications for this encounter.   [name] acetaminophen (TYLENOL) 500 MG tablet  . albuterol (VENTOLIN HFA) 108 (90 Base) MCG/ACT inhaler  . aspirin EC 325 MG EC tablet  .  atorvastatin (LIPITOR) 40 MG tablet  . buPROPion (WELLBUTRIN XL) 150 MG 24 hr tablet  . Cholecalciferol (VITAMIN D3) 50 MCG (2000 UT) capsule  . clopidogrel (PLAVIX) 75 MG tablet  . Fluticasone-Umeclidin-Vilant (TRELEGY ELLIPTA) 100-62.5-25 MCG/INH AEPB  . metFORMIN (GLUCOPHAGE-XR) 500 MG 24 hr tablet  . nicotine polacrilex (COMMIT) 4 MG lozenge  . Blood Glucose Monitoring Suppl (ONETOUCH VERIO) w/Device KIT  . glucose blood (ONETOUCH VERIO) test strip  . Insulin Pen Needle (BD PEN NEEDLE NANO U/F) 32G X 4 MM MISC  . Lancet Devices (ONE TOUCH DELICA LANCING DEV) MISC  . meloxicam (MOBIC) 15 MG tablet  . OneTouch Delica Lancets 19I MISC  . polyethylene glycol powder (GLYCOLAX/MIRALAX) 17 GM/SCOOP powder     Myra Gianotti, PA-C Surgical Short Stay/Anesthesiology [name] Phone [phone] [facility] Phone [phone] 12/08/2019 1:12 PM

## 2019-12-09 ENCOUNTER — Inpatient Hospital Stay (HOSPITAL_COMMUNITY): Admission: RE | Admit: 2019-12-09 | Payer: Medicare Other | Source: Home / Self Care | Admitting: Vascular Surgery

## 2019-12-09 HISTORY — DX: Essential (primary) hypertension: I10

## 2019-12-09 HISTORY — DX: Personal history of urinary calculi: Z87.442

## 2019-12-09 SURGERY — ENDARTERECTOMY, CAROTID
Anesthesia: General | Laterality: Left

## 2019-12-09 NOTE — Progress Notes (Signed)
Patient had not arrived at scheduled time, 0530.   Secretary called patient and patient stated "uber driver brought her to the hospital but could not leave her and took her back home".  Patient informed secretary that she will have to reschedule and make better arrangements next time.  Vincent aware at [employer] and stated he would call Dr. Donzetta Matters.

## 2019-12-17 ENCOUNTER — Ambulatory Visit: Payer: Self-pay | Admitting: Pharmacist

## 2019-12-17 ENCOUNTER — Other Ambulatory Visit: Payer: Self-pay | Admitting: Nurse Practitioner

## 2019-12-17 DIAGNOSIS — E118 Type 2 diabetes mellitus with unspecified complications: Secondary | ICD-10-CM

## 2019-12-17 DIAGNOSIS — F324 Major depressive disorder, single episode, in partial remission: Secondary | ICD-10-CM | POA: Diagnosis not present

## 2019-12-17 DIAGNOSIS — F17200 Nicotine dependence, unspecified, uncomplicated: Secondary | ICD-10-CM

## 2019-12-17 DIAGNOSIS — J449 Chronic obstructive pulmonary disease, unspecified: Secondary | ICD-10-CM | POA: Diagnosis not present

## 2019-12-17 DIAGNOSIS — Z794 Long term (current) use of insulin: Secondary | ICD-10-CM

## 2019-12-17 DIAGNOSIS — I10 Essential (primary) hypertension: Secondary | ICD-10-CM

## 2019-12-17 NOTE — Patient Instructions (Signed)
Thank you allowing the Chronic Care Management Team to be a part of your care! It was a pleasure speaking with you today!     CCM (Chronic Care Management) Team    [name] RN, MSN, CCM Nurse Care Coordinator  [phone]   [name] PharmD  Clinical Pharmacist  [phone]   [name] LCSW Clinical Social Worker [phone]  Visit Information  Goals Addressed            This Visit's Progress   . PharmD-Medication Adherence       Current Barriers:  . Financial Barriers . Knowledge deficits related to coordination of her own care . Limited social support   Pharmacist Clinical Goal(s):   [name] Over the next 30 days, patient will demonstrate improved medication adherence as evidenced by verbalized understanding of prescribed medication regimen, assistance available, and patient report of adherence  Interventions: . Received coordination of care message from CCM Nurse Case Manager advising that patient was scheduled to be evicted from current residence . Perform chart review - Note left carotid endarterectomy procedure rescheduled . F/u with patient regarding housing concerns. Ms. Obenour reports that she is no longer expecting to be evicted as rent has been paid ? Reports still looking into find new house and will continue to f/u with team . Coordination of care ? Provide patient with contact information for [facility] for patient to f/u for instructions and then confirm information with her transportation . Counsel patient on importance of medication adherence ? Confirms will f/u with [name] to order next delivery of pill packages and to order refill of lancets . Reports completed services from [place]  ? Reports improvement in walking during time that she received PT, increasing her walking distance . Counsel patient on continued importance of blood pressure monitoring and control ? Note patient remains off  of antihypertensive medication  . Follow up with patient about obtaining new upper arm BP monitor  ? Confirms having health benefit OTC card, but waiting for OTC card benefit reload with new quarter (tomorrow) ? Encourage patient use card to obtain new upper arm monitor at [facility] . Counsel on continued importance of blood sugar control  ? Reports taking metformin ER 500 mg twice daily from pill pack ? Reports recent home CBGs ? Today after breakfast: 224 ? Reports ate soup, crackers and fruit cup ? Yesterday before breakfast: 116  . Counsel patient on importance of eating regular meals and limitiing carbohydrate portion sizes.  [name] on interpreting nutrition labels for carbohydrate content and sodium content . Counsel on importance of smoking cessation ? Patient reports currently smoking a total of: 1 cigarette/day  ? States "I take 2 pulls and put it out" a couple of times during the day when she is stressed. ? Discuss strategies to help patient to manage her stress/manage craving ? Patient continues to "keep busy" around house and walk to keep herself distracted when having cravings  Patient Self Care Activities:  . Patient takes medications as directed with aid of adherence tools. o Using pill packaging from [facility] . Calls pharmacy for medication refills . Patient to attend scheduled medical appointments o Left carotid endarterectomy procedure rescheduled for 4/13 . Calls provider office for new concerns or questions  . Patient to check blood sugars and keep log . Patient to check blood pressure daily and keep log  Please see past updates related to this goal  by clicking on the "Past Updates" button in the selected goal          Patient verbalizes understanding of instructions provided today.   Telephone follow up appointment with care management team member scheduled for: 4/28 at 1 pm  [name], PharmD, [place] [phone]

## 2019-12-17 NOTE — Chronic Care Management (AMB) (Signed)
Chronic Care Management   Follow Up Note   12/17/2019 Name: Kendra [name] MRN: [id number] DOB: 07/16/1946  Referred by: Kendra College, NP (Inactive) Reason for referral : Chronic Care Management (Patient Phone Call)   Kendra [name] is a 74 y.o. year old female who is a primary care patient of Kendra College, NP (Inactive). The CCM team was consulted for assistance with chronic disease management and care coordination needs.  Kendra [name] has a medical history which includes but is not limited to recent CVA (11/2018), Type 2 Diabetes, hypertension, hyperlipidemia and COPD.   I reached out to Kendra [name] by phone today.   Review of patient status, including review of consultants reports, relevant laboratory and other test results, and collaboration with appropriate care team members and the patient's provider was performed as part of comprehensive patient evaluation and provision of chronic care management services.    SDOH (Social Determinants of Health) screening performed today: Tobacco Use Physical Activity. See Care Plan for related entries.   Outpatient Encounter Medications as of 12/17/2019  Medication Sig  . Fluticasone-Umeclidin-Vilant (TRELEGY ELLIPTA) 100-62.5-25 MCG/INH AEPB Inhale 1 puff into the lungs daily.  . metFORMIN (GLUCOPHAGE-XR) 500 MG 24 hr tablet Take 1 tablet (500 mg total) by mouth 2 (two) times daily before a meal.  . acetaminophen (TYLENOL) 500 MG tablet Take 2 tablets (1,000 mg total) by mouth every 8 (eight) hours as needed for mild pain or moderate pain.  [name] albuterol (VENTOLIN HFA) 108 (90 Base) MCG/ACT inhaler INHALE 1 TO 2 PUFFS INTO THE LUNGS EVERY 6 HOURS AS NEEDED FOR WHEEZING OR SHORTNESS OF BREATH (Patient taking differently: Inhale 1-2 puffs into the lungs every 6 (six) hours as needed for wheezing or shortness of breath. )  . aspirin EC 325 MG EC tablet Take 1 tablet (325 mg total) by mouth daily.  [name] atorvastatin (LIPITOR) 40  MG tablet Take 1 tablet (40 mg total) by mouth daily.  . Blood Glucose Monitoring Suppl (ONETOUCH VERIO) w/Device KIT 1 kit by Does not apply route 2 (two) times daily.  [name] buPROPion (WELLBUTRIN XL) 150 MG 24 hr tablet Take 1 tablet (150 mg total) by mouth daily.  . Cholecalciferol (VITAMIN D3) 50 MCG (2000 UT) capsule Take 1 capsule (2,000 Units total) by mouth daily.  . clopidogrel (PLAVIX) 75 MG tablet Take 1 tablet (75 mg total) by mouth daily. (Patient taking differently: Take 75 mg by mouth daily in the afternoon. )  . glucose blood (ONETOUCH VERIO) test strip Use as instructed  . Insulin Pen Needle (BD PEN NEEDLE NANO U/F) 32G X 4 MM MISC USE WITH LANTUS AS DIRECTED  . Lancet Devices (ONE TOUCH DELICA LANCING DEV) MISC 1 Device by Does not apply route 2 (two) times daily.  . meloxicam (MOBIC) 15 MG tablet TAKE 1 TABLET BY MOUTH ONCE DAILY AS NEEDED (Patient not taking: Reported on 11/28/2019)  . nicotine polacrilex (COMMIT) 4 MG lozenge Take 4 mg by mouth as needed for smoking cessation.  Kendra [name] Delica Lancets 82U MISC 1 Device by Does not apply route 2 (two) times daily.  . polyethylene glycol powder (GLYCOLAX/MIRALAX) 17 GM/SCOOP powder Take 17-34 g by mouth daily as needed for mild constipation or moderate constipation. (Patient not taking: Reported on 11/28/2019)   No facility-administered encounter medications on file as of 12/17/2019.    Goals Addressed            This Visit's Progress   . PharmD-Medication Adherence  Current Barriers:  . Financial Barriers . Knowledge deficits related to coordination of her own care . Limited social support   Pharmacist Clinical Goal(s):   [name] Over the next 30 days, patient will demonstrate improved medication adherence as evidenced by verbalized understanding of prescribed medication regimen, assistance available, and patient report of adherence  Interventions: . Received coordination of care message from CCM Nurse Case Manager  advising that patient was scheduled to be evicted from current residence . Perform chart review - Note left carotid endarterectomy procedure rescheduled . F/u with patient regarding housing concerns. [name] reports that she is no longer expecting to be evicted as rent has been paid ? Reports still looking into find new house and will continue to f/u with team . Coordination of care ? Provide patient with contact information for Kendra [name] for patient to f/u for instructions and then confirm information with her transportation . Counsel patient on importance of medication adherence ? Confirms will f/u with Kendra [name] to order next delivery of pill packages and to order refill of lancets . Reports completed services from Kendra [name]  ? Reports improvement in walking during time that she received PT, increasing her walking distance . Counsel patient on continued importance of blood pressure monitoring and control ? Note patient remains off of antihypertensive medication  . Follow up with patient about obtaining new upper arm BP monitor  ? Confirms having health benefit OTC card, but waiting for OTC card benefit reload with new quarter (tomorrow) ? Encourage patient use card to obtain new upper arm monitor at Kendra [name] . Counsel on continued importance of blood sugar control  ? Reports taking metformin ER 500 mg twice daily from pill pack ? Reports recent home CBGs ? Today after breakfast: 224 ? Reports ate soup, crackers and fruit cup ? Yesterday before breakfast: 116  . Counsel patient on importance of eating regular meals and limitiing carbohydrate portion sizes.  Kendra [name] on interpreting nutrition labels for carbohydrate content and sodium content . Counsel on importance of smoking cessation ? Patient reports currently smoking a total of: 1 cigarette/day  ? States "I take 2 pulls and put it out" a couple of times during the day when she is  stressed. ? Discuss strategies to help patient to manage her stress/manage craving ? Patient continues to "keep busy" around house and walk to keep herself distracted when having cravings  Patient Self Care Activities:  . Patient takes medications as directed with aid of adherence tools. o Using pill packaging from [name] . Calls pharmacy for medication refills . Patient to attend scheduled medical appointments o Left carotid endarterectomy procedure rescheduled for 4/13 . Calls provider office for new concerns or questions  . Patient to check blood sugars and keep log . Patient to check blood pressure daily and keep log  Please see past updates related to this goal by clicking on the "Past Updates" button in the selected goal          Plan  Telephone follow up appointment with care management team member scheduled for: 4/28 at 1 pm  [name], PharmD, [place] [phone]

## 2019-12-19 ENCOUNTER — Other Ambulatory Visit: Payer: Self-pay | Admitting: Family Medicine

## 2019-12-19 DIAGNOSIS — Z794 Long term (current) use of insulin: Secondary | ICD-10-CM

## 2019-12-19 DIAGNOSIS — E118 Type 2 diabetes mellitus with unspecified complications: Secondary | ICD-10-CM

## 2019-12-22 ENCOUNTER — Telehealth: Payer: Self-pay

## 2019-12-22 NOTE — Telephone Encounter (Signed)
Pt called with c/o 8/10 headache on R side of face/head x 2 days. It is coming and going, with no relief from Tylenol. Pt. Was offered an appt for carotid duplex tomorrow which she can not make due to transportation issues. She has accepted an appt for Wed. Afternoon. Dr. Donzetta Matters has been made aware. Pt verbalized understanding of appt date/time/location.

## 2019-12-23 ENCOUNTER — Telehealth: Payer: Self-pay

## 2019-12-23 ENCOUNTER — Other Ambulatory Visit: Payer: Self-pay | Admitting: *Deleted

## 2019-12-23 ENCOUNTER — Telehealth (HOSPITAL_COMMUNITY): Payer: Self-pay

## 2019-12-23 DIAGNOSIS — I6523 Occlusion and stenosis of bilateral carotid arteries: Secondary | ICD-10-CM

## 2019-12-23 NOTE — Telephone Encounter (Signed)
Spoke to pt today and yesterday and reviewed her upcoming appts for surgery and her office visit at [facility] this week. Pt verbalized understanding.

## 2019-12-23 NOTE — Telephone Encounter (Signed)
The above patient or their representative was contacted and gave the following answers to these questions:         Do you have any of the following symptoms?    NO  Fever                    Cough                   Shortness of breath  Do  you have any of the following other symptoms?    muscle pain         vomiting,        diarrhea        rash         weakness        red eye        abdominal pain         bruising          bruising or bleeding              joint pain           severe headache    Have you been in contact with someone who was or has been sick in the past 2 weeks?  NO  Yes                 Unsure                         Unable to assess   Does the person that you were in contact with have any of the following symptoms?   Cough         shortness of breath           muscle pain         vomiting,            diarrhea            rash            weakness           fever            red eye           abdominal pain           bruising  or  bleeding                joint pain                severe headache                 COMMENTS OR ACTION PLAN FOR THIS PATIENT:        ALL QUESTION WERE ANSWERED/[facility] 

## 2019-12-24 ENCOUNTER — Encounter (HOSPITAL_COMMUNITY): Payer: Self-pay | Admitting: Emergency Medicine

## 2019-12-24 ENCOUNTER — Other Ambulatory Visit: Payer: Self-pay

## 2019-12-24 ENCOUNTER — Emergency Department (HOSPITAL_COMMUNITY)
Admission: EM | Admit: 2019-12-24 | Discharge: 2019-12-24 | Disposition: A | Discharge status: Home / Self Care | Payer: Medicare Other | Attending: Emergency Medicine | Admitting: Emergency Medicine

## 2019-12-24 ENCOUNTER — Ambulatory Visit (INDEPENDENT_AMBULATORY_CARE_PROVIDER_SITE_OTHER)
Admission: RE | Admit: 2019-12-24 | Discharge: 2019-12-24 | Disposition: A | Discharge status: Home / Self Care | Payer: Medicare Other | Source: Ambulatory Visit | Attending: Vascular Surgery | Admitting: Vascular Surgery

## 2019-12-24 DIAGNOSIS — E119 Type 2 diabetes mellitus without complications: Secondary | ICD-10-CM | POA: Diagnosis not present

## 2019-12-24 DIAGNOSIS — I10 Essential (primary) hypertension: Secondary | ICD-10-CM | POA: Insufficient documentation

## 2019-12-24 DIAGNOSIS — J449 Chronic obstructive pulmonary disease, unspecified: Secondary | ICD-10-CM | POA: Insufficient documentation

## 2019-12-24 DIAGNOSIS — I6523 Occlusion and stenosis of bilateral carotid arteries: Secondary | ICD-10-CM

## 2019-12-24 DIAGNOSIS — Z79899 Other long term (current) drug therapy: Secondary | ICD-10-CM | POA: Insufficient documentation

## 2019-12-24 DIAGNOSIS — Z20822 Contact with and (suspected) exposure to covid-19: Secondary | ICD-10-CM

## 2019-12-24 DIAGNOSIS — Z7901 Long term (current) use of anticoagulants: Secondary | ICD-10-CM | POA: Diagnosis not present

## 2019-12-24 DIAGNOSIS — Z7984 Long term (current) use of oral hypoglycemic drugs: Secondary | ICD-10-CM | POA: Diagnosis not present

## 2019-12-24 LAB — SARS CORONAVIRUS 2 (TAT 6-24 HRS): SARS Coronavirus 2: NEGATIVE

## 2019-12-24 NOTE — ED Triage Notes (Signed)
Pt need covid test for preop. Denies any symptoms.

## 2019-12-24 NOTE — Discharge Instructions (Addendum)
Your results should be available within 24 hours.

## 2019-12-24 NOTE — ED Provider Notes (Signed)
[facility] EMERGENCY DEPARTMENT Provider Note   CSN: [id number] Arrival date & time: 12/24/19  1001     History Chief Complaint  Patient presents with  . needs covid test    Kendra [name] is a 74 y.o. female.  74 y.o female with a PMH of COPD, HTN presents to the ED for COVID-19 testing.  Patient has a procedure scheduled for April 13, she needs preop Covid testing, was referred to the ED for testing.  No symptoms such as chest pain, shortness of breath, or URI.  The history is provided by the patient and medical records.       Past Medical History:  Diagnosis Date  . Back pain   . Cataract   . Cervical disc disorder   . COPD (chronic obstructive pulmonary disease) ([place])   . Depression   . Diabetes ([place])   . Diabetic neuropathy ([place])   . GERD (gastroesophageal reflux disease)   . History of kidney stones    per patient "can't remember the year"  . Hyperlipidemia   . Hypertension    per patient "take meds for it"  . Insomnia   . Neuropathy   . Osteoarthritis   . Osteopenia   . Pneumonia 2018   per patient  . Reflux   . Rheumatoid arthritis ([place])   . Sleep apnea    no CPAP and no suggestion that she needed one per pt  . Stroke ([place]) 2015   and again 2017  - Weakness in left leg, staggers w/ walking  . Tenosynovitis     Patient Active Problem List   Diagnosis Date Noted  . 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 Weakness of left upper extremity 10/16/2019  . Left-sided weakness 10/16/2019  . Hypertension associated with diabetes ([place]) 12/19/2018  . Chronic midline low back pain without sciatica 05/07/2018  . Radiculopathy of leg 04/01/2017  . Calcification of aorta ([facility]) 09/27/2016  . Early satiety 07/26/2016  . CMC arthritis, thumb, degenerative 07/16/2015  . Weight loss 06/07/2015  . Tenosynovitis of wrist 06/07/2015  . Protein calorie malnutrition ([place]) 06/07/2015  . Diabetes mellitus type  2, controlled, with complications ([place]) 56/15/3794  . Cataract 03/30/2015  . Other and unspecified disc disorder of cervical region 03/30/2015  . Insomnia 03/30/2015  . Centrilobular emphysema ([place]) 03/30/2015  . Clinical depression 03/30/2015  . Hyperlipidemia associated with type 2 diabetes mellitus ([place]) 03/30/2015  . Gastro-esophageal reflux disease without esophagitis 03/30/2015  . Increased thickness of nail 03/30/2015  . History of cerebrovascular accident with residual deficit 03/30/2015  . Osteoarthritis 03/30/2015  . Osteopenia 03/30/2015  . Allergic rhinitis 03/30/2015  . B12 deficiency 03/30/2015  . Tobacco use disorder 03/30/2015  . Vitamin D deficiency 03/30/2015  . Mycotic toenails 08/12/2014  . Diabetic peripheral neuropathy associated with type 2 diabetes mellitus ([place]) 08/12/2014    Past Surgical History:  Procedure Laterality Date  . BREAST BIOPSY    . ENDARTERECTOMY Right 10/21/2019   Procedure: ENDARTERECTOMY CAROTID RIGHT;  Surgeon: [name], MD;  Location: [facility];  Service: Vascular;  Laterality: Right;  . NECK SURGERY    . OVARIAN CYST REMOVAL    . PATCH ANGIOPLASTY Right 10/21/2019   Procedure: Patch Angioplasty Using XenoSure Biologic Patch Right Carotid;  Surgeon: [name], MD;  Location: [facility];  Service: Vascular;  Laterality: Right;     OB History   No obstetric history on file.  Family History  Problem Relation Age of Onset  . Stroke Father   . Depression Father   . Diabetes Mother   . Hyperlipidemia Mother   . Drug abuse Sister   . Heart murmur Son   . Drug abuse Sister   . Heart murmur Son   . Heart murmur Son     Social History   Tobacco Use  . Smoking status: Current Some Day Smoker    Packs/day: 1.00    Types: Cigarettes  . Smokeless tobacco: Former Systems developer    Types: Snuff  Substance Use Topics  . Alcohol use: No  . Drug use: No    Home Medications Prior to Admission medications     Medication Sig Start Date End Date Taking? Authorizing Provider  acetaminophen (TYLENOL) 500 MG tablet Take 2 tablets (1,000 mg total) by mouth every 8 (eight) hours as needed for mild pain or moderate pain. 12/20/18   [name], NP  albuterol (VENTOLIN HFA) 108 (90 Base) MCG/ACT inhaler INHALE 1-2 PUFFS EVERY 6 HOURS AS NEEDEDSHORTNESS OF BREATH AND WHEEZING 12/18/19   Malfi, [name], FNP  atorvastatin (LIPITOR) 40 MG tablet Take 1 tablet (40 mg total) by mouth daily. 10/24/19   Karamalegos, Devonne Doughty, DO  Blood Glucose Monitoring Suppl (ONETOUCH VERIO) w/Device KIT 1 kit by Does not apply route 2 (two) times daily. 12/20/18   [name], NP  buPROPion (WELLBUTRIN XL) 150 MG 24 hr tablet Take 1 tablet (150 mg total) by mouth daily. 10/24/19   Karamalegos, Devonne Doughty, DO  Cholecalciferol (VITAMIN D3) 50 MCG (2000 UT) capsule Take 1 capsule (2,000 Units total) by mouth daily. 12/20/18   [name], NP  clopidogrel (PLAVIX) 75 MG tablet Take 1 tablet (75 mg total) by mouth daily. Patient taking differently: Take 75 mg by mouth daily in the afternoon.  11/20/19   Karamalegos, Devonne Doughty, DO  Fluticasone-Umeclidin-Vilant (TRELEGY ELLIPTA) 100-62.5-25 MCG/INH AEPB Inhale 1 puff into the lungs daily. 08/27/19   Karamalegos, Devonne Doughty, DO  glucose blood (ONETOUCH VERIO) test strip Use as instructed 12/20/18   [name], NP  Insulin Pen Needle (BD PEN NEEDLE NANO U/F) 32G X 4 MM MISC USE WITH LANTUS AS DIRECTED 12/20/18   [name], NP  Lancet Devices (ONE TOUCH DELICA LANCING DEV) MISC 1 Device by Does not apply route 2 (two) times daily. 12/20/18   [name], NP  meloxicam (MOBIC) 15 MG tablet TAKE 1 TABLET BY MOUTH ONCE DAILY AS NEEDED Patient not taking: Reported on 11/28/2019 11/18/19   Olin Hauser, DO  metFORMIN (GLUCOPHAGE-XR) 500 MG 24 hr tablet TAKE 1 TABLET BY MOUTH TWICE DAILY BEFORE A MEAL 12/19/19   Karamalegos, Devonne Doughty, DO   nicotine polacrilex (COMMIT) 4 MG lozenge Take 4 mg by mouth as needed for smoking cessation.    [provider]  OneTouch Delica Lancets 10X MISC 1 Device by Does not apply route 2 (two) times daily. 12/20/18   [name], NP    Allergies    Citalopram  Review of Systems   Review of Systems  Constitutional: Negative for fever.  Respiratory: Negative for shortness of breath.   Cardiovascular: Negative for chest pain.    Physical Exam Updated Vital Signs BP 107/81   Pulse 85   Temp 98.5 F (36.9 C) (Oral)   Resp 16   Ht _0  (1.626 m)   Wt 54.4 kg   SpO2 100%   BMI  20.60 kg/m   Physical Exam Vitals and nursing note reviewed.  Constitutional:      Appearance: Normal appearance.  HENT:     Head: Normocephalic and atraumatic.     Nose: Nose normal.     Mouth/Throat:     Mouth: Mucous membranes are moist.  Cardiovascular:     Rate and Rhythm: Normal rate.  Pulmonary:     Effort: Pulmonary effort is normal.  Abdominal:     General: Abdomen is flat.     Tenderness: There is no abdominal tenderness.  Musculoskeletal:     Cervical back: Normal range of motion and neck supple.  Skin:    General: Skin is warm and dry.  Neurological:     Mental Status: She is alert and oriented to person, place, and time.     ED Results / Procedures / Treatments   Labs (all labs ordered are listed, but only abnormal results are displayed) Labs Reviewed  SARS CORONAVIRUS 2 (TAT 6-24 HRS)    EKG None  Radiology VAS US CAROTID  Result Date: 12/24/2019 Carotid Arterial Duplex Study Indications:                           Carotid artery disease and right                                        endarterectomy. Comparison Study:                      carotid 10/17/2019 performed at [facility]     A kink/loop was present in the Right Correlation                            ICA.Right bifurcation is  located near                                        the Hyoid Notch. Stenosis proximal to                                        left bifurcation (CCA stenosis). ICA is                                        normal past the stenosis. Anatomy on the                                        left is within normal limits.Left                                        bifurcation is located  near the Hyoid                                        Notch. Performing Technologist: Kathrine Comfort RVT, RDCS  Examination Guidelines: A complete evaluation includes B-mode imaging, spectral Doppler, color Doppler, and power Doppler as needed of all accessible portions of each vessel. Bilateral testing is considered an integral part of a complete examination. Limited examinations for reoccurring indications may be performed as noted.  Right Carotid Findings: +----------+--------+--------+--------+------------------+------------------+           PSV cm/sEDV cm/sStenosisPlaque DescriptionComments           +----------+--------+--------+--------+------------------+------------------+ CCA Prox  62      20                                                   +----------+--------+--------+--------+------------------+------------------+ CCA Mid   43      17                                                   +----------+--------+--------+--------+------------------+------------------+ CCA Distal40      12                                intimal thickening +----------+--------+--------+--------+------------------+------------------+ ICA Prox  72      26      1-39%                                        +----------+--------+--------+--------+------------------+------------------+ ICA Mid   73      30                                                   +----------+--------+--------+--------+------------------+------------------+ ICA Distal56      25                                                    +----------+--------+--------+--------+------------------+------------------+ ECA       57      12                                minimal plaque     +----------+--------+--------+--------+------------------+------------------+ +----------+--------+-------+----------------+-------------------+           PSV cm/sEDV cmsDescribe        Arm Pressure (mmHG) +----------+--------+-------+----------------+-------------------+ [id number]             Multiphasic, WNL                    +----------+--------+-------+----------------+-------------------+ +---------+--------+--+--------+---------+ VertebralPSV cm/s40EDV cm/sAntegrade +---------+--------+--+--------+---------+  Left Carotid Findings: +----------+-------+-------+--------+-----------------+------------------------+  PSV    EDV    StenosisPlaque           Comments                           cm/s   cm/s           Description                               +----------+-------+-------+--------+-----------------+------------------------+ CCA Prox  67     20                                                       +----------+-------+-------+--------+-----------------+------------------------+ CCA Mid   44     16                                                       +----------+-------+-------+--------+-----------------+------------------------+ CCA Distal29     14                              intimal thickening       +----------+-------+-------+--------+-----------------+------------------------+ ICA Prox  281    62                              cannot rule out                                                           ulceration               +----------+-------+-------+--------+-----------------+------------------------+ ICA Mid   63     27                                                       +----------+-------+-------+--------+-----------------+------------------------+ ICA  Distal75     33                                                       +----------+-------+-------+--------+-----------------+------------------------+ ECA       113    18                                                       +----------+-------+-------+--------+-----------------+------------------------+ +----------+--------+--------+----------------+-------------------+           PSV cm/sEDV cm/sDescribe        Arm Pressure (mmHG) +----------+--------+--------+----------------+-------------------+  [id number]              Multiphasic, WNL                    +----------+--------+--------+----------------+-------------------+ +---------+--------+--+--------+---------+ VertebralPSV cm/s31EDV cm/sAntegrade +---------+--------+--+--------+---------+   Summary: Right Carotid: Velocities in the right ICA are consistent with a 1-39% stenosis.                Non-hemodynamically significant plaque <50% noted in the CCA. The                ECA appears <50% stenosed. Left Carotid: Velocities in the left ICA are consistent with a 60-79% stenosis,               ulceration cannot be ruled out. Non-hemodynamically significant               plaque <50% noted in the CCA. The ECA appears <50% stenosed. Vertebrals:  Bilateral vertebral arteries demonstrate antegrade flow. Subclavians: Normal flow hemodynamics were seen in bilateral subclavian              arteries. *See table(s) above for measurements and observations.  Electronically signed by Deitra Mayo MD on 12/24/2019 at 9:44:05 AM.    Final     Procedures Procedures (including critical care time)  Medications Ordered in ED Medications - No data to display  ED Course  I have reviewed the triage vital signs and the nursing notes.  Pertinent labs & imaging results that were available during my care of the patient were reviewed by me and considered in my medical decision making (see chart for details).    MDM  Rules/Calculators/A&P   Patient presents to the ED for COVID-19 testing, she has a endarterectomy scheduled for December 30, 2019, was referred to the ED for preop testing.  She denies any symptoms such as fever, shortness of breath, chest pain or URI symptoms.  Test was ordered for patient, as her procedures not until 13 April a 24-hour test was sent out.  Patient in stable condition, with stable vital signs stable for discharge.   Portions of this note were generated with Lobbyist. Dictation errors may occur despite best attempts at proofreading.  Final Clinical Impression(s) / ED Diagnoses Final diagnoses:  Encounter for laboratory testing for COVID-19 virus    Rx / DC Orders ED Discharge Orders    None       Janeece Fitting, PA-C 12/24/19 1158    Tegeler, Gwenyth Allegra, MD 12/24/19 [phone]

## 2019-12-25 ENCOUNTER — Ambulatory Visit (INDEPENDENT_AMBULATORY_CARE_PROVIDER_SITE_OTHER): Payer: Medicare Other | Admitting: Licensed Clinical Social Worker

## 2019-12-25 DIAGNOSIS — J449 Chronic obstructive pulmonary disease, unspecified: Secondary | ICD-10-CM

## 2019-12-25 DIAGNOSIS — I10 Essential (primary) hypertension: Secondary | ICD-10-CM

## 2019-12-25 DIAGNOSIS — E118 Type 2 diabetes mellitus with unspecified complications: Secondary | ICD-10-CM

## 2019-12-25 DIAGNOSIS — F324 Major depressive disorder, single episode, in partial remission: Secondary | ICD-10-CM

## 2019-12-25 DIAGNOSIS — Z794 Long term (current) use of insulin: Secondary | ICD-10-CM

## 2019-12-25 NOTE — Progress Notes (Signed)
Kendra [name]  Your procedure is scheduled on Tuesday December 30, 2019  Report to [facility] Main Entrance "A" at 10:45 A.M., and check in at the Admitting office.  Call this number if you have problems the morning of surgery: [phone]  Call [phone] if you have any questions prior to your surgery date Monday-Friday 8am-4pm   Remember: Do not eat or drink after midnight the night before your surgery  Take these medicines the morning of surgery with A SIP OF WATER: atorvastatin (LIPITOR) buPROPion (WELLBUTRIN XL)  Fluticasone-Umeclidin-Vilant (TRELEGY ELLIPTA)   Take if needed: acetaminophen (TYLENOL)  albuterol (VENTOLIN HFA) --- Please bring all inhalers with you the day of surgery.   Follow your surgeon's instructions on when to stop clopidogrel (PLAVIX).   >> If no instructions were given by your surgeon then you will need to call the office to get those instructions.    As of today, STOP taking any meloxicam (MOBIC), Aspirin (unless otherwise instructed by your surgeon), Aleve, Naproxen, Ibuprofen, Motrin, Advil, Goody's, BC's, all herbal medications, fish oil, and all vitamins.   WHAT DO I DO ABOUT MY DIABETES MEDICATION?  . THE DAY BEFORE SURGERY o metFORMIN (GLUCOPHAGE-XR) - take as usual      . THE MORNING OF SURGERY Do not take oral diabetes medicines (pills) the morning of surgery.   HOW TO MANAGE YOUR DIABETES BEFORE AND AFTER SURGERY  Why is it important to control my blood sugar before and after surgery? . Improving blood sugar levels before and after surgery helps healing and can limit problems. . A way of improving blood sugar control is eating a healthy diet by: o  Eating less sugar and carbohydrates o  Increasing activity/exercise o  Talking with your doctor about reaching your blood sugar goals . High blood sugars (greater than 180 mg/dL) can raise your risk of infections and slow your recovery, so you will need to focus on controlling your  diabetes during the weeks before surgery. . Make sure that the doctor who takes care of your diabetes knows about your planned surgery including the date and location.  How do I manage my blood sugar before surgery? . Check your blood sugar at least 4 times a day, starting 2 days before surgery, to make sure that the level is not too high or low. . Check your blood sugar the morning of your surgery when you wake up and every 2 hours until you get to the Short Stay unit. o If your blood sugar is less than 70 mg/dL, you will need to treat for low blood sugar: - Do not take insulin. - Treat a low blood sugar (less than 70 mg/dL) with  cup of clear juice (cranberry or apple), 4 glucose tablets, OR glucose gel. - Recheck blood sugar in 15 minutes after treatment (to make sure it is greater than 70 mg/dL). If your blood sugar is not greater than 70 mg/dL on recheck, call [phone] for further instructions. . Report your blood sugar to the short stay nurse when you get to Short Stay.  . If you are admitted to the hospital after surgery: o Your blood sugar will be checked by the staff and you will probably be given insulin after surgery (instead of oral diabetes medicines) to make sure you have good blood sugar levels. o The goal for blood sugar control after surgery is 80-180 mg/dL.     The Morning of Surgery  Do not wear jewelry, make-up or nail polish.  Do not wear lotions, powders, perfumes, or deodorant  Do not shave 48 hours prior to surgery.  Do not bring valuables to the hospital.  [facility] is not responsible for any belongings or valuables.  If you are a smoker, DO NOT Smoke 24 hours prior to surgery  If you wear a CPAP at night please bring your mask the morning of surgery   Remember that you must have someone to transport you home after your surgery, and remain with you for 24 hours if you are discharged the same day.   Please bring cases for contacts, glasses, hearing aids,  dentures or bridgework because it cannot be worn into surgery.    Leave your suitcase in the car.  After surgery it may be brought to your room.  For patients admitted to the hospital, discharge time will be determined by your treatment team.  Patients discharged the day of surgery will not be allowed to drive home.    Special instructions:   [facility]- Preparing For Surgery  Before surgery, you can play an important role. Because skin is not sterile, your skin needs to be as free of germs as possible. You can reduce the number of germs on your skin by washing with CHG (chlorahexidine gluconate) Soap before surgery.  CHG is an antiseptic cleaner which kills germs and bonds with the skin to continue killing germs even after washing.    Oral Hygiene is also important to reduce your risk of infection.  Remember - BRUSH YOUR TEETH THE MORNING OF SURGERY WITH YOUR REGULAR TOOTHPASTE  Please do not use if you have an allergy to CHG or antibacterial soaps. If your skin becomes reddened/irritated stop using the CHG.  Do not shave (including legs and underarms) for at least 48 hours prior to first CHG shower. It is OK to shave your face.  Please follow these instructions carefully.   1. Shower the NIGHT BEFORE SURGERY and the MORNING OF SURGERY with CHG Soap.   2. If you chose to wash your hair and body, wash as usual with your normal shampoo and body-wash/soap.  3. Rinse your hair and body thoroughly to remove the shampoo and soap.  4. Apply CHG directly to the skin (ONLY FROM THE NECK DOWN) and wash gently with a scrungie or a clean washcloth.   5. Do not use on open wounds or open sores. Avoid contact with your eyes, ears, mouth and genitals (private parts). Wash Face and genitals (private parts)  with your normal soap.   6. Wash thoroughly, paying special attention to the area where your surgery will be performed.  7. Thoroughly rinse your body with warm water from the neck  down.  8. DO NOT shower/wash with your normal soap after using and rinsing off the CHG Soap.  9. Pat yourself dry with a CLEAN TOWEL.  10. Wear CLEAN PAJAMAS to bed the night before surgery  11. Place CLEAN SHEETS on your bed the night of your first shower and DO NOT SLEEP WITH PETS.  12. Wear comfortable clothes the morning of surgery.     Day of Surgery:  Please shower the morning of surgery with the CHG soap Do not apply any deodorants/lotions. Please wear clean clothes to the hospital/surgery center.   Remember to brush your teeth WITH YOUR REGULAR TOOTHPASTE.   Please read over the following fact sheets that you were given.

## 2019-12-25 NOTE — Chronic Care Management (AMB) (Signed)
Chronic Care Management    Clinical Social Work Follow Up Note  12/25/2019 Name: Kendra [name] MRN: [id number] DOB: 1946/04/09  Kendra [name] is a 74 y.o. year old female who is a primary care patient of [name], NP (Inactive). The CCM team was consulted for assistance with Level of Care Concerns.   Review of patient status, including review of consultants reports, other relevant assessments, and collaboration with appropriate care team members and the patient's provider was performed as part of comprehensive patient evaluation and provision of chronic care management services.    SDOH (Social Determinants of Health) assessments performed: Yes    Outpatient Encounter Medications as of 12/25/2019  Medication Sig  . acetaminophen (TYLENOL) 500 MG tablet Take 2 tablets (1,000 mg total) by mouth every 8 (eight) hours as needed for mild pain or moderate pain.  [name] albuterol (VENTOLIN HFA) 108 (90 Base) MCG/ACT inhaler INHALE 1-2 PUFFS EVERY 6 HOURS AS NEEDEDSHORTNESS OF BREATH AND WHEEZING  . atorvastatin (LIPITOR) 40 MG tablet Take 1 tablet (40 mg total) by mouth daily.  . Blood Glucose Monitoring Suppl (ONETOUCH VERIO) w/Device KIT 1 kit by Does not apply route 2 (two) times daily.  [name] buPROPion (WELLBUTRIN XL) 150 MG 24 hr tablet Take 1 tablet (150 mg total) by mouth daily.  . Cholecalciferol (VITAMIN D3) 50 MCG (2000 UT) capsule Take 1 capsule (2,000 Units total) by mouth daily.  . clopidogrel (PLAVIX) 75 MG tablet Take 1 tablet (75 mg total) by mouth daily. (Patient taking differently: Take 75 mg by mouth daily in the afternoon. )  . Fluticasone-Umeclidin-Vilant (TRELEGY ELLIPTA) 100-62.5-25 MCG/INH AEPB Inhale 1 puff into the lungs daily.  [name] glucose blood (ONETOUCH VERIO) test strip Use as instructed  . Insulin Pen Needle (BD PEN NEEDLE NANO U/F) 32G X 4 MM MISC USE WITH LANTUS AS DIRECTED  . Lancet Devices (ONE TOUCH DELICA LANCING DEV) MISC 1 Device by Does not apply route  2 (two) times daily.  . meloxicam (MOBIC) 15 MG tablet TAKE 1 TABLET BY MOUTH ONCE DAILY AS NEEDED (Patient not taking: Reported on 11/28/2019)  . metFORMIN (GLUCOPHAGE-XR) 500 MG 24 hr tablet TAKE 1 TABLET BY MOUTH TWICE DAILY BEFORE A MEAL  . nicotine polacrilex (COMMIT) 4 MG lozenge Take 4 mg by mouth as needed for smoking cessation.  [name] Lancets 62G MISC 1 Device by Does not apply route 2 (two) times daily.   No facility-administered encounter medications on file as of 12/25/2019.     Goals Addressed    . "I probably could benefit from more support." (pt-stated)       Current Barriers:  . Financial constraints . Limited social support . Housing barriers . Level of care concerns . ADL IADL limitations . Family and relationship dysfunction . Social Isolation . Limited education about housing resources available in [facility] as well as LTC placement options* . Limited access to caregiver . LIMITED FOOD ACCESS  Clinical Social Work Clinical Goal(s):  [name] Over the next 90 days, client will work with SW to address concerns related to lack of overall support/resources/healthy meals within the home . Over the next 90 days, patient will work with LCSW to address needs related to implementing appropriate self-care into her daily routine to promote healthy living.  Interventions: . Patient interviewed and appropriate assessments performed . Reviewed all upcoming medical appointments with patent. Patient has procedure tomorrow at 9 am and confirms to have stable transportation for this. . Provided  patient with information about LTC placement as patient already has Medicaid secondary insurance coverage. LCSW also provided patient with education on available transportation resources through [facility] and [facility]. Patient will need to contact her Medicaid caseworker at [place] to notify them that she wishes to sign up for transportation services. Patient reports that her son is currently  providing stable transportation for her to all of her medical appointments. Patient has still not contacted Medicaid caseworker to set up transportation services and discuss other further benefits that are available to her. Patient  was encouraged to do so today.  . Evaluated patient's stress level as she is going in to have another procedure tomorrow. Emotional Support provided due to ongoing health complications.  . Discussed plans with patient for ongoing care management follow up and provided patient with direct contact information for care management team . Advised patient to contact CCM providers if she has any concerns in regards to her safety, housing or well-being. Patient confirms that her son manages all of her finances and she declines having any issues with this at this time.  . Assisted patient/caregiver with obtaining information about health plan benefits . Provided education and assistance to client regarding Advanced Directives. . Provided education to patient/caregiver regarding level of care options. . Patient confirms to struggle with ongoing headaches which causes her stress/pain/fatigue. Emotional support provided and patient was receptive to this. .  Positive reinforcement provided for keeping up with all of her medical appointments.  . Patient reports that her ramp is still effectively working for her. . Patient declines any urgent case management needs at this time. Patient confirms having food at this time. Referrals made in the past for food insecurity.  .  Patient Self Care Activities:  . Attends all scheduled provider appointments . Calls provider office for new concerns or questions  Please see past updates related to this goal by clicking on the "Past Updates" button in the selected goal      . I probably could use some extra support right now. (pt-stated)       Current Barriers:  . Financial constraints . Limited social support . ADL IADL limitations . Mental  Health Concerns  . Social Isolation . Limited access to caregiver . Inability to perform ADL's independently . Inability to perform IADL's independently . Lacks knowledge of community resource: grief support resources within the area  Clinical Social Work Clinical Goal(s):  [name]  Over the next 120 days, patient will work with SW to address concerns related to gaining additional support within the home and resource connection in order to maintain health and independency within the community  . [name] Over the next 120 days, patient will demonstrate improved adherence to self care as evidenced by implementing healthy self-care into her daily routine such as: attending all medical appointments, taking time for self-reflection, taking medications as prescribed, drinking water and daily exercise to improve mobility.  Interventions: . Patient interviewed and appropriate assessments performed . Education provided on how to implement appropriate self-care and coping tools into her daily routine. Patient was receptive to this education. . CCM team had concerns about patient's home situation as it seems very stressful at times. Patient denies any conflict with family that she resides with. She reports being safe and well cared for in the home. She denies needing APS involvement. She continues to deny any abuse/neglect. Patient reports being "just fine other than my health." LCSW will continue to re-assess this concern in the future.  [name]  Provided patient with information about grief support resources within her area  . Discussed plans with patient for ongoing care management follow up and provided patient with direct contact information for care management team . Collaborated with CCM Pharmacist and CCM Nursse . Assisted patient/caregiver with obtaining information about health plan benefits . LCSW used active and reflective listening and implemented appropriate interventions to help suppport patient and her  emotional needs.  Patient Self Care Activities:  . Attends all scheduled provider appointments . Calls provider office for new concerns or questions  Please see past updates related to this goal by clicking on the "Past Updates" button in the selected goal      Follow Up Plan: SW will follow up with patient by phone over the next quarter  [name], [place], MSW, [email or web address] Phone: [phone]

## 2019-12-26 ENCOUNTER — Other Ambulatory Visit: Payer: Self-pay

## 2019-12-26 ENCOUNTER — Other Ambulatory Visit (HOSPITAL_COMMUNITY): Payer: Medicare Other

## 2019-12-26 ENCOUNTER — Encounter: Payer: Self-pay | Admitting: Vascular Surgery

## 2019-12-26 ENCOUNTER — Telehealth: Payer: Self-pay

## 2019-12-26 ENCOUNTER — Ambulatory Visit (INDEPENDENT_AMBULATORY_CARE_PROVIDER_SITE_OTHER): Payer: Self-pay | Admitting: Vascular Surgery

## 2019-12-26 ENCOUNTER — Inpatient Hospital Stay (HOSPITAL_COMMUNITY)
Admission: RE | Admit: 2019-12-26 | Discharge: 2019-12-26 | Disposition: A | Discharge status: Home / Self Care | Payer: Medicare Other | Source: Ambulatory Visit

## 2019-12-26 DIAGNOSIS — I6523 Occlusion and stenosis of bilateral carotid arteries: Secondary | ICD-10-CM

## 2019-12-26 NOTE — Progress Notes (Signed)
Virtual Visit via Telephone Note   I connected with [name] on 12/26/2019 using the [employer] by telephone and verified that I was speaking with the correct person using two identifiers.    The limitations of evaluation and management by telemedicine and the availability of in person appointments have been previously discussed with the patient and are documented in the patients chart. The patient expressed understanding and consented to proceed.    History of Present Illness: Kendra [name] is a 74 y.o. female with status post right carotid endarterectomy for symptomatic disease.  She remains on aspirin Plavix and statin.  She was having some pain and called with concern underwent carotid duplex there were no issues with that.  She has no new neurologic events.  She states she had some imbalance issues but otherwise she is doing fine.  She is planned for surgery next week.  Past Medical History:  Diagnosis Date  . Back pain   . Cataract   . Cervical disc disorder   . COPD (chronic obstructive pulmonary disease) ([place])   . Depression   . Diabetes ([place])   . Diabetic neuropathy ([place])   . GERD (gastroesophageal reflux disease)   . History of kidney stones    per patient "can't remember the year"  . Hyperlipidemia   . Hypertension    per patient "take meds for it"  . Insomnia   . Neuropathy   . Osteoarthritis   . Osteopenia   . Pneumonia 2018   per patient  . Reflux   . Rheumatoid arthritis ([place])   . Sleep apnea    no CPAP and no suggestion that she needed one per pt  . Stroke ([place]) 2015   and again 2017  - Weakness in left leg, staggers w/ walking  . Tenosynovitis     Past Surgical History:  Procedure Laterality Date  . BREAST BIOPSY    . ENDARTERECTOMY Right 10/21/2019   Procedure: ENDARTERECTOMY CAROTID RIGHT;  Surgeon: [name], MD;  Location: [facility];  Service: Vascular;  Laterality: Right;  . NECK SURGERY    . OVARIAN CYST REMOVAL    .  PATCH ANGIOPLASTY Right 10/21/2019   Procedure: Patch Angioplasty Using XenoSure Biologic Patch Right Carotid;  Surgeon: [name], MD;  Location: [facility];  Service: Vascular;  Laterality: Right;    Current Meds  Medication Sig  . acetaminophen (TYLENOL) 500 MG tablet Take 2 tablets (1,000 mg total) by mouth every 8 (eight) hours as needed for mild pain or moderate pain.  [name] albuterol (VENTOLIN HFA) 108 (90 Base) MCG/ACT inhaler INHALE 1-2 PUFFS EVERY 6 HOURS AS NEEDEDSHORTNESS OF BREATH AND WHEEZING  . aspirin 325 MG tablet Take 325 mg by mouth daily.  [name] atorvastatin (LIPITOR) 40 MG tablet Take 1 tablet (40 mg total) by mouth daily.  . Blood Glucose Monitoring Suppl (ONETOUCH VERIO) w/Device KIT 1 kit by Does not apply route 2 (two) times daily.  [name] buPROPion (WELLBUTRIN XL) 150 MG 24 hr tablet Take 1 tablet (150 mg total) by mouth daily.  . Cholecalciferol (VITAMIN D3) 50 MCG (2000 UT) capsule Take 1 capsule (2,000 Units total) by mouth daily.  . clopidogrel (PLAVIX) 75 MG tablet Take 1 tablet (75 mg total) by mouth daily. (Patient taking differently: Take 75 mg by mouth daily in the afternoon. )  . Fluticasone-Umeclidin-Vilant (TRELEGY ELLIPTA) 100-62.5-25 MCG/INH AEPB Inhale 1 puff into the lungs daily.  [name] glucose blood (ONETOUCH VERIO) test strip  Use as instructed  . Insulin Pen Needle (BD PEN NEEDLE NANO U/F) 32G X 4 MM MISC USE WITH LANTUS AS DIRECTED  . Lancet Devices (ONE TOUCH DELICA LANCING DEV) MISC 1 Device by Does not apply route 2 (two) times daily.  . metFORMIN (GLUCOPHAGE-XR) 500 MG 24 hr tablet TAKE 1 TABLET BY MOUTH TWICE DAILY BEFORE A MEAL  . nicotine polacrilex (COMMIT) 4 MG lozenge Take 4 mg by mouth as needed for smoking cessation.  [name] Lancets 35D MISC 1 Device by Does not apply route 2 (two) times daily.    12 system ROS was negative unless otherwise noted in HPI   Observations/Objective: She is awake and alert demonstrates good  understanding during our conversation.  Assessment and Plan: 74 year old female status post right carotid endarterectomy for symptomatic disease now plan for left carotid endarterectomy with ICA string sign and a bruit on her previous exam.  I reviewed her CT scan again today which is somewhat incongruent with the recent duplex however I think given the string sign we should proceed with left carotid endarterectomy.  I discussed this with the patient she demonstrates good understanding she has had her Covid testing and she will quarantine over the weekend.    I spent greater than 5 minutes minutes with the patient via telephone encounter and in reviewing records.   Signed, Servando Snare [facility] Office: [phone]  12/26/2019, 10:44 AM

## 2019-12-26 NOTE — Telephone Encounter (Signed)
Spoke to [place] at [facility] at [phone] to get pt set up with transportation to the hospital for surgery on 4/13. Confirmation # is [id number]. Pt is aware to expect their arrival btwn 0815-0845. Pt has been reminded that if she needs a ride home she can call them back to set this up herself once she knows when she will be d/c home. Pt denies further questions/concerns at this time.

## 2019-12-29 ENCOUNTER — Other Ambulatory Visit: Payer: Self-pay

## 2019-12-29 ENCOUNTER — Encounter (HOSPITAL_COMMUNITY): Payer: Self-pay | Admitting: Vascular Surgery

## 2019-12-29 ENCOUNTER — Inpatient Hospital Stay (HOSPITAL_COMMUNITY): Admission: RE | Admit: 2019-12-29 | Payer: Medicare Other | Source: Ambulatory Visit

## 2019-12-29 NOTE — Progress Notes (Signed)
Ms Gerfen denies chest pain or shortness of breath. Patient will have Covid at [facility]; transportation company is picking patient up 08:30- 08:45. Ms Fridge has type II diabetes, she reports that CBG was 124 today. I instructed patient to check CBG after awaking and every 2 hours until arrival  to the hospital.  I Instructed patient if CBG is less than 70 or 1/2 cup of a Pespi (patient said that she was instruction by a diabetic nurse, she does not have any juice.) Recheck CBG in 15 minutes then call pre- op desk at [phone] for further instructions.   Ms Vittitoe reports that she was not given instructions to hold Aspirin or Plavix. Patients mediation is pre packaged, she cannot tell medications apart; I instructed Ms Schreiter to not  to ake pills, use Trelegy and use Albuterol if needed  bring it with you.  Ms Bradt is arriving at the the hospital via  [facility] , she will go to Covid testing site first and arrive at the hospital by 1015.

## 2019-12-30 ENCOUNTER — Encounter (HOSPITAL_COMMUNITY): Payer: Self-pay | Admitting: Vascular Surgery

## 2019-12-30 ENCOUNTER — Inpatient Hospital Stay (HOSPITAL_COMMUNITY)
Admission: RE | Admit: 2019-12-30 | Discharge: 2019-12-31 | DRG: 039 | Disposition: A | Discharge status: Home / Self Care | Payer: Medicare Other | Attending: Vascular Surgery | Admitting: Vascular Surgery

## 2019-12-30 ENCOUNTER — Encounter (HOSPITAL_COMMUNITY)
Admission: RE | Disposition: A | Discharge status: Home / Self Care | Payer: Self-pay | Source: Home / Self Care | Attending: Vascular Surgery

## 2019-12-30 ENCOUNTER — Other Ambulatory Visit (HOSPITAL_COMMUNITY)
Admission: RE | Admit: 2019-12-30 | Discharge: 2019-12-30 | Disposition: A | Discharge status: Home / Self Care | Payer: Medicare Other | Source: Ambulatory Visit | Attending: Vascular Surgery | Admitting: Vascular Surgery

## 2019-12-30 ENCOUNTER — Inpatient Hospital Stay (HOSPITAL_COMMUNITY): Payer: Medicare Other

## 2019-12-30 ENCOUNTER — Other Ambulatory Visit: Payer: Self-pay

## 2019-12-30 DIAGNOSIS — Z79899 Other long term (current) drug therapy: Secondary | ICD-10-CM | POA: Diagnosis not present

## 2019-12-30 DIAGNOSIS — F329 Major depressive disorder, single episode, unspecified: Secondary | ICD-10-CM | POA: Diagnosis present

## 2019-12-30 DIAGNOSIS — M8580 Other specified disorders of bone density and structure, unspecified site: Secondary | ICD-10-CM | POA: Diagnosis present

## 2019-12-30 DIAGNOSIS — F17210 Nicotine dependence, cigarettes, uncomplicated: Secondary | ICD-10-CM | POA: Diagnosis present

## 2019-12-30 DIAGNOSIS — E1140 Type 2 diabetes mellitus with diabetic neuropathy, unspecified: Secondary | ICD-10-CM | POA: Diagnosis present

## 2019-12-30 DIAGNOSIS — Z7984 Long term (current) use of oral hypoglycemic drugs: Secondary | ICD-10-CM | POA: Diagnosis not present

## 2019-12-30 DIAGNOSIS — E785 Hyperlipidemia, unspecified: Secondary | ICD-10-CM | POA: Diagnosis present

## 2019-12-30 DIAGNOSIS — K219 Gastro-esophageal reflux disease without esophagitis: Secondary | ICD-10-CM | POA: Diagnosis present

## 2019-12-30 DIAGNOSIS — I10 Essential (primary) hypertension: Secondary | ICD-10-CM | POA: Diagnosis present

## 2019-12-30 DIAGNOSIS — J449 Chronic obstructive pulmonary disease, unspecified: Secondary | ICD-10-CM | POA: Diagnosis present

## 2019-12-30 DIAGNOSIS — I6522 Occlusion and stenosis of left carotid artery: Principal | ICD-10-CM | POA: Diagnosis present

## 2019-12-30 DIAGNOSIS — M069 Rheumatoid arthritis, unspecified: Secondary | ICD-10-CM | POA: Diagnosis present

## 2019-12-30 DIAGNOSIS — M1990 Unspecified osteoarthritis, unspecified site: Secondary | ICD-10-CM | POA: Diagnosis present

## 2019-12-30 DIAGNOSIS — Z7982 Long term (current) use of aspirin: Secondary | ICD-10-CM | POA: Diagnosis not present

## 2019-12-30 DIAGNOSIS — Z8673 Personal history of transient ischemic attack (TIA), and cerebral infarction without residual deficits: Secondary | ICD-10-CM

## 2019-12-30 DIAGNOSIS — Z20822 Contact with and (suspected) exposure to covid-19: Secondary | ICD-10-CM | POA: Diagnosis present

## 2019-12-30 DIAGNOSIS — E1136 Type 2 diabetes mellitus with diabetic cataract: Secondary | ICD-10-CM | POA: Diagnosis present

## 2019-12-30 DIAGNOSIS — I6529 Occlusion and stenosis of unspecified carotid artery: Secondary | ICD-10-CM | POA: Diagnosis present

## 2019-12-30 HISTORY — PX: PATCH ANGIOPLASTY: SHX6230

## 2019-12-30 HISTORY — DX: Unspecified asthma, uncomplicated: J45.909

## 2019-12-30 HISTORY — DX: Dyspnea, unspecified: R06.00

## 2019-12-30 HISTORY — PX: ENDARTERECTOMY: SHX5162

## 2019-12-30 LAB — GLUCOSE, CAPILLARY
Glucose-Capillary: 93 mg/dL (ref 70–99)
Glucose-Capillary: 107 mg/dL — ABNORMAL HIGH (ref 70–99)
Glucose-Capillary: 127 mg/dL — ABNORMAL HIGH (ref 70–99)
Glucose-Capillary: 174 mg/dL — ABNORMAL HIGH (ref 70–99)

## 2019-12-30 LAB — COMPREHENSIVE METABOLIC PANEL
ALT: 23 U/L (ref 0–44)
AST: 21 U/L (ref 15–41)
Albumin: 4.2 g/dL (ref 3.5–5.0)
Alkaline Phosphatase: 73 U/L (ref 38–126)
Anion gap: 8 (ref 5–15)
BUN: 9 mg/dL (ref 8–23)
CO2: 28 mmol/L (ref 22–32)
Calcium: 10 mg/dL (ref 8.9–10.3)
Chloride: 106 mmol/L (ref 98–111)
Creatinine, Ser: 0.76 mg/dL (ref 0.44–1.00)
GFR calc Af Amer: >60 mL/min (ref >60)
GFR calc non Af Amer: >60 mL/min (ref >60)
Glucose, Bld: 107 mg/dL — ABNORMAL HIGH (ref 70–99)
Potassium: 4.3 mmol/L (ref 3.5–5.1)
Sodium: 142 mmol/L (ref 135–145)
Total Bilirubin: 0.6 mg/dL (ref 0.3–1.2)
Total Protein: 7.4 g/dL (ref 6.5–8.1)

## 2019-12-30 LAB — CBC
HCT: 38.7 % (ref 36.0–46.0)
Hemoglobin: 12.4 g/dL (ref 12.0–15.0)
MCH: 30.9 pg (ref 26.0–34.0)
MCHC: 32 g/dL (ref 30.0–36.0)
MCV: 96.5 fL (ref 80.0–100.0)
Platelets: 272 10*3/uL (ref 150–400)
RBC: 4.01 MIL/uL (ref 3.87–5.11)
RDW: 13.5 % (ref 11.5–15.5)
WBC: 5.5 10*3/uL (ref 4.0–10.5)
nRBC: 0 % (ref 0.0–0.2)

## 2019-12-30 LAB — URINALYSIS, ROUTINE W REFLEX MICROSCOPIC
Bilirubin Urine: NEGATIVE
Glucose, UA: NEGATIVE mg/dL
Hgb urine dipstick: NEGATIVE
Ketones, ur: NEGATIVE mg/dL
Leukocytes,Ua: NEGATIVE
Nitrite: NEGATIVE
Protein, ur: NEGATIVE mg/dL
Specific Gravity, Urine: 1.017 (ref 1.005–1.030)
pH: 5 (ref 5.0–8.0)

## 2019-12-30 LAB — TYPE AND SCREEN
ABO/RH(D): O POS
Antibody Screen: NEGATIVE

## 2019-12-30 LAB — PROTIME-INR
INR: 1.1 (ref 0.8–1.2)
Prothrombin Time: 14.4 seconds (ref 11.4–15.2)

## 2019-12-30 LAB — ABO/RH: ABO/RH(D): O POS

## 2019-12-30 LAB — RESPIRATORY PANEL BY RT PCR (FLU A&B, COVID)
Influenza A by PCR: NEGATIVE
Influenza B by PCR: NEGATIVE
SARS Coronavirus 2 by RT PCR: NEGATIVE

## 2019-12-30 LAB — APTT: aPTT: 32 seconds (ref 24–36)

## 2019-12-30 SURGERY — ENDARTERECTOMY, CAROTID
Anesthesia: General | Laterality: Left

## 2019-12-30 MED ORDER — ONDANSETRON HCL 4 MG/2ML IJ SOLN
INTRAMUSCULAR | Status: DC | PRN
  Administered 2019-12-30: 4 mg via INTRAVENOUS

## 2019-12-30 MED ORDER — NICOTINE 21 MG/24HR TD PT24: 21.0000 mg | MEDICATED_PATCH | Freq: Every day | TRANSDERMAL | Status: DC | PRN

## 2019-12-30 MED ORDER — ALBUMIN HUMAN 5 % IV SOLN
12.5000 g | Freq: Once | INTRAVENOUS | Status: AC
  Administered 2019-12-30: 12.5 g via INTRAVENOUS

## 2019-12-30 MED ORDER — DEXAMETHASONE SODIUM PHOSPHATE 10 MG/ML IJ SOLN
INTRAMUSCULAR | Status: AC
  Filled 2019-12-30: qty 1

## 2019-12-30 MED ORDER — CHLORHEXIDINE GLUCONATE 4 % EX LIQD: 60.0000 mL | Freq: Once | CUTANEOUS | Status: DC

## 2019-12-30 MED ORDER — DEXAMETHASONE SODIUM PHOSPHATE 10 MG/ML IJ SOLN
INTRAMUSCULAR | Status: DC | PRN
  Administered 2019-12-30: 10 mg via INTRAVENOUS

## 2019-12-30 MED ORDER — METOPROLOL TARTRATE 5 MG/5ML IV SOLN: 2.0000 mg | INTRAVENOUS | Status: DC | PRN

## 2019-12-30 MED ORDER — LIDOCAINE 2% (20 MG/ML) 5 ML SYRINGE
INTRAMUSCULAR | Status: AC
  Filled 2019-12-30: qty 5

## 2019-12-30 MED ORDER — MORPHINE SULFATE (PF) 2 MG/ML IV SOLN: 2.0000 mg | INTRAVENOUS | Status: DC | PRN

## 2019-12-30 MED ORDER — SODIUM CHLORIDE 0.9 % IV SOLN
0.0125 ug/kg/min | INTRAVENOUS | Status: AC
  Administered 2019-12-30: .15 ug/kg/min via INTRAVENOUS
  Filled 2019-12-30: qty 2000

## 2019-12-30 MED ORDER — PHENYLEPHRINE 40 MCG/ML (10ML) SYRINGE FOR IV PUSH (FOR BLOOD PRESSURE SUPPORT)
PREFILLED_SYRINGE | INTRAVENOUS | Status: DC | PRN
  Administered 2019-12-30: 80 ug via INTRAVENOUS
  Administered 2019-12-30: 40 ug via INTRAVENOUS

## 2019-12-30 MED ORDER — PHENYLEPHRINE 40 MCG/ML (10ML) SYRINGE FOR IV PUSH (FOR BLOOD PRESSURE SUPPORT)
PREFILLED_SYRINGE | INTRAVENOUS | Status: AC
  Filled 2019-12-30: qty 10

## 2019-12-30 MED ORDER — SODIUM CHLORIDE 0.9 % IV SOLN: INTRAVENOUS | Status: DC

## 2019-12-30 MED ORDER — ALBUTEROL SULFATE (2.5 MG/3ML) 0.083% IN NEBU
INHALATION_SOLUTION | RESPIRATORY_TRACT | Status: AC
  Filled 2019-12-30: qty 3

## 2019-12-30 MED ORDER — POTASSIUM CHLORIDE CRYS ER 20 MEQ PO TBCR: 20.0000 meq | EXTENDED_RELEASE_TABLET | Freq: Every day | ORAL | Status: DC | PRN

## 2019-12-30 MED ORDER — ATORVASTATIN CALCIUM 40 MG PO TABS
40.0000 mg | ORAL_TABLET | Freq: Every day | ORAL | Status: DC
  Administered 2019-12-30 – 2019-12-31 (×2): 40 mg via ORAL
  Filled 2019-12-30 (×2): qty 1

## 2019-12-30 MED ORDER — FENTANYL CITRATE (PF) 250 MCG/5ML IJ SOLN
INTRAMUSCULAR | Status: AC
  Filled 2019-12-30: qty 5

## 2019-12-30 MED ORDER — LACTATED RINGERS IV BOLUS
500.0000 mL | Freq: Once | INTRAVENOUS | Status: AC
  Administered 2019-12-30: 500 mL via INTRAVENOUS

## 2019-12-30 MED ORDER — OXYCODONE-ACETAMINOPHEN 5-325 MG PO TABS
ORAL_TABLET | ORAL | Status: AC
  Filled 2019-12-30: qty 1

## 2019-12-30 MED ORDER — ASPIRIN 325 MG PO TABS
325.0000 mg | ORAL_TABLET | Freq: Every day | ORAL | Status: DC
  Administered 2019-12-31: 325 mg via ORAL
  Filled 2019-12-30: qty 1

## 2019-12-30 MED ORDER — LACTATED RINGERS IV SOLN: INTRAVENOUS | Status: DC | PRN

## 2019-12-30 MED ORDER — PROTAMINE SULFATE 10 MG/ML IV SOLN
INTRAVENOUS | Status: DC | PRN
  Administered 2019-12-30: 40 mg via INTRAVENOUS
  Administered 2019-12-30: 10 mg via INTRAVENOUS

## 2019-12-30 MED ORDER — ACETAMINOPHEN 325 MG PO TABS: 325.0000 mg | ORAL_TABLET | ORAL | Status: DC | PRN

## 2019-12-30 MED ORDER — CEFAZOLIN SODIUM-DEXTROSE 2-4 GM/100ML-% IV SOLN
2.0000 g | INTRAVENOUS | Status: AC
  Administered 2019-12-30: 2 g via INTRAVENOUS
  Filled 2019-12-30: qty 100

## 2019-12-30 MED ORDER — PHENYLEPHRINE HCL-NACL 10-0.9 MG/250ML-% IV SOLN
INTRAVENOUS | Status: DC | PRN
  Administered 2019-12-30: 50 ug/min via INTRAVENOUS

## 2019-12-30 MED ORDER — ONDANSETRON HCL 4 MG/2ML IJ SOLN: 4.0000 mg | Freq: Once | INTRAMUSCULAR | Status: DC | PRN

## 2019-12-30 MED ORDER — HEMOSTATIC AGENTS (NO CHARGE) OPTIME
TOPICAL | Status: DC | PRN
  Administered 2019-12-30: 1 via TOPICAL

## 2019-12-30 MED ORDER — LIDOCAINE HCL (PF) 1 % IJ SOLN
INTRAMUSCULAR | Status: AC
  Filled 2019-12-30: qty 5

## 2019-12-30 MED ORDER — ONDANSETRON HCL 4 MG/2ML IJ SOLN
INTRAMUSCULAR | Status: AC
  Filled 2019-12-30: qty 2

## 2019-12-30 MED ORDER — 0.9 % SODIUM CHLORIDE (POUR BTL) OPTIME
TOPICAL | Status: DC | PRN
  Administered 2019-12-30: 1000 mL

## 2019-12-30 MED ORDER — CLOPIDOGREL BISULFATE 75 MG PO TABS
75.0000 mg | ORAL_TABLET | Freq: Every day | ORAL | Status: DC
  Administered 2019-12-30: 75 mg via ORAL
  Filled 2019-12-30: qty 1

## 2019-12-30 MED ORDER — DEXTRAN 40 IN SALINE 10-0.9 % IV SOLN
INTRAVENOUS | Status: AC
  Filled 2019-12-30: qty 500

## 2019-12-30 MED ORDER — ROCURONIUM BROMIDE 10 MG/ML (PF) SYRINGE
PREFILLED_SYRINGE | INTRAVENOUS | Status: AC
  Filled 2019-12-30: qty 10

## 2019-12-30 MED ORDER — SODIUM CHLORIDE 0.9 % IV SOLN: 500.0000 mL | Freq: Once | INTRAVENOUS | Status: DC | PRN

## 2019-12-30 MED ORDER — ONDANSETRON HCL 4 MG/2ML IJ SOLN
4.0000 mg | Freq: Four times a day (QID) | INTRAMUSCULAR | Status: DC | PRN
  Administered 2019-12-30: 4 mg via INTRAVENOUS
  Filled 2019-12-30: qty 2

## 2019-12-30 MED ORDER — PHENOL 1.4 % MT LIQD: 1.0000 | OROMUCOSAL | Status: DC | PRN

## 2019-12-30 MED ORDER — ALBUTEROL SULFATE (2.5 MG/3ML) 0.083% IN NEBU: 3.0000 mL | INHALATION_SOLUTION | Freq: Four times a day (QID) | RESPIRATORY_TRACT | Status: DC | PRN

## 2019-12-30 MED ORDER — PROPOFOL 10 MG/ML IV BOLUS
INTRAVENOUS | Status: DC | PRN
  Administered 2019-12-30: 30 mg via INTRAVENOUS
  Administered 2019-12-30: 80 mg via INTRAVENOUS

## 2019-12-30 MED ORDER — DEXTRAN 40 IN SALINE 10-0.9 % IV SOLN
INTRAVENOUS | Status: AC | PRN
  Administered 2019-12-30: 500 mL

## 2019-12-30 MED ORDER — INSULIN ASPART 100 UNIT/ML ~~LOC~~ SOLN
0.0000 [IU] | Freq: Three times a day (TID) | SUBCUTANEOUS | Status: DC
  Administered 2019-12-30: 2 [IU] via SUBCUTANEOUS

## 2019-12-30 MED ORDER — HEPARIN SODIUM (PORCINE) 1000 UNIT/ML IJ SOLN
INTRAMUSCULAR | Status: DC | PRN
  Administered 2019-12-30: 7000 [IU] via INTRAVENOUS

## 2019-12-30 MED ORDER — MEPERIDINE HCL 25 MG/ML IJ SOLN: 6.2500 mg | INTRAMUSCULAR | Status: DC | PRN

## 2019-12-30 MED ORDER — PANTOPRAZOLE SODIUM 40 MG PO TBEC
40.0000 mg | DELAYED_RELEASE_TABLET | Freq: Every day | ORAL | Status: DC
  Administered 2019-12-31: 40 mg via ORAL
  Filled 2019-12-30: qty 1

## 2019-12-30 MED ORDER — HYDRALAZINE HCL 20 MG/ML IJ SOLN: 5.0000 mg | INTRAMUSCULAR | Status: DC | PRN

## 2019-12-30 MED ORDER — DOCUSATE SODIUM 100 MG PO CAPS
100.0000 mg | ORAL_CAPSULE | Freq: Every day | ORAL | Status: DC
  Administered 2019-12-31: 100 mg via ORAL
  Filled 2019-12-30: qty 1

## 2019-12-30 MED ORDER — FENTANYL CITRATE (PF) 250 MCG/5ML IJ SOLN
INTRAMUSCULAR | Status: DC | PRN
  Administered 2019-12-30: 150 ug via INTRAVENOUS

## 2019-12-30 MED ORDER — ALBUTEROL SULFATE (2.5 MG/3ML) 0.083% IN NEBU
2.5000 mg | INHALATION_SOLUTION | Freq: Four times a day (QID) | RESPIRATORY_TRACT | Status: DC | PRN
  Administered 2019-12-30: 2.5 mg via RESPIRATORY_TRACT

## 2019-12-30 MED ORDER — LABETALOL HCL 5 MG/ML IV SOLN: 10.0000 mg | INTRAVENOUS | Status: DC | PRN

## 2019-12-30 MED ORDER — ROCURONIUM BROMIDE 10 MG/ML (PF) SYRINGE
PREFILLED_SYRINGE | INTRAVENOUS | Status: DC | PRN
  Administered 2019-12-30: 50 mg via INTRAVENOUS

## 2019-12-30 MED ORDER — LIDOCAINE 2% (20 MG/ML) 5 ML SYRINGE
INTRAMUSCULAR | Status: DC | PRN
  Administered 2019-12-30: 100 mg via INTRAVENOUS

## 2019-12-30 MED ORDER — OXYCODONE-ACETAMINOPHEN 5-325 MG PO TABS
1.0000 | ORAL_TABLET | ORAL | Status: DC | PRN
  Administered 2019-12-30: 1 via ORAL
  Administered 2019-12-30 – 2019-12-31 (×3): 2 via ORAL
  Filled 2019-12-30 (×3): qty 2

## 2019-12-30 MED ORDER — SODIUM CHLORIDE 0.9 % IV SOLN
INTRAVENOUS | Status: AC
  Filled 2019-12-30: qty 1.2

## 2019-12-30 MED ORDER — ALUM & MAG HYDROXIDE-SIMETH 200-200-20 MG/5ML PO SUSP: 15.0000 mL | ORAL | Status: DC | PRN

## 2019-12-30 MED ORDER — SODIUM CHLORIDE 0.9 % IV SOLN
INTRAVENOUS | Status: DC | PRN
  Administered 2019-12-30: 500 mL

## 2019-12-30 MED ORDER — GUAIFENESIN-DM 100-10 MG/5ML PO SYRP: 15.0000 mL | ORAL_SOLUTION | ORAL | Status: DC | PRN

## 2019-12-30 MED ORDER — EPHEDRINE 5 MG/ML INJ
INTRAVENOUS | Status: AC
  Filled 2019-12-30: qty 10

## 2019-12-30 MED ORDER — FENTANYL CITRATE (PF) 100 MCG/2ML IJ SOLN
INTRAMUSCULAR | Status: AC
  Filled 2019-12-30: qty 2

## 2019-12-30 MED ORDER — EPHEDRINE SULFATE-NACL 50-0.9 MG/10ML-% IV SOSY
PREFILLED_SYRINGE | INTRAVENOUS | Status: DC | PRN
  Administered 2019-12-30: 2.5 mg via INTRAVENOUS
  Administered 2019-12-30: 5 mg via INTRAVENOUS
  Administered 2019-12-30: 10 mg via INTRAVENOUS

## 2019-12-30 MED ORDER — ALBUMIN HUMAN 5 % IV SOLN
INTRAVENOUS | Status: AC
  Filled 2019-12-30: qty 250

## 2019-12-30 MED ORDER — FENTANYL CITRATE (PF) 100 MCG/2ML IJ SOLN
25.0000 ug | INTRAMUSCULAR | Status: DC | PRN
  Administered 2019-12-30 (×2): 25 ug via INTRAVENOUS

## 2019-12-30 MED ORDER — CEFAZOLIN SODIUM-DEXTROSE 2-4 GM/100ML-% IV SOLN
2.0000 g | Freq: Three times a day (TID) | INTRAVENOUS | Status: AC
  Administered 2019-12-30 – 2019-12-31 (×2): 2 g via INTRAVENOUS
  Filled 2019-12-30 (×2): qty 100

## 2019-12-30 MED ORDER — MAGNESIUM SULFATE 2 GM/50ML IV SOLN: 2.0000 g | Freq: Every day | INTRAVENOUS | Status: DC | PRN

## 2019-12-30 MED ORDER — ACETAMINOPHEN 325 MG RE SUPP: 325.0000 mg | RECTAL | Status: DC | PRN

## 2019-12-30 SURGICAL SUPPLY — 48 items
BAG DECANTER FOR FLEXI CONT (MISCELLANEOUS) ×1 IMPLANT
CANISTER SUCT 3000ML PPV (MISCELLANEOUS) ×2 IMPLANT
CATH ROBINSON RED A/P 18FR (CATHETERS) ×2 IMPLANT
CLIP VESOCCLUDE MED 24/CT (CLIP) ×2 IMPLANT
CLIP VESOCCLUDE SM WIDE 24/CT (CLIP) ×3 IMPLANT
COVER PROBE W GEL 5X96 (DRAPES) ×2 IMPLANT
COVER WAND RF STERILE (DRAPES) ×1 IMPLANT
DERMABOND ADVANCED (GAUZE/BANDAGES/DRESSINGS) ×1
DERMABOND ADVANCED .7 DNX12 (GAUZE/BANDAGES/DRESSINGS) ×1 IMPLANT
DRAIN CHANNEL 15F RND FF W/TCR (WOUND CARE) IMPLANT
ELECT REM PT RETURN 9FT ADLT (ELECTROSURGICAL) ×2
ELECTRODE REM PT RTRN 9FT ADLT (ELECTROSURGICAL) ×1 IMPLANT
EVACUATOR SILICONE 100CC (DRAIN) IMPLANT
GLOVE BIO SURGEON STRL SZ7.5 (GLOVE) ×4 IMPLANT
GOWN STRL REUS W/ TWL LRG LVL3 (GOWN DISPOSABLE) ×2 IMPLANT
GOWN STRL REUS W/ TWL XL LVL3 (GOWN DISPOSABLE) ×1 IMPLANT
GOWN STRL REUS W/TWL LRG LVL3 (GOWN DISPOSABLE)
GOWN STRL REUS W/TWL XL LVL3 (GOWN DISPOSABLE) ×3
HEMOSTAT SNOW SURGICEL 2X4 (HEMOSTASIS) ×1 IMPLANT
INSERT FOGARTY SM (MISCELLANEOUS) IMPLANT
IV ADAPTER SYR DOUBLE MALE LL (MISCELLANEOUS) IMPLANT
KIT BASIN OR (CUSTOM PROCEDURE TRAY) ×2 IMPLANT
KIT SHUNT ARGYLE CAROTID ART 6 (VASCULAR PRODUCTS) ×2 IMPLANT
KIT TURNOVER KIT B (KITS) ×2 IMPLANT
NDL HYPO 25GX1X1/2 BEV (NEEDLE) IMPLANT
NDL SPNL 20GX3.5 QUINCKE YW (NEEDLE) IMPLANT
NEEDLE HYPO 25GX1X1/2 BEV (NEEDLE) IMPLANT
NEEDLE SPNL 20GX3.5 QUINCKE YW (NEEDLE) IMPLANT
NS IRRIG 1000ML POUR BTL (IV SOLUTION) ×6 IMPLANT
PACK CAROTID (CUSTOM PROCEDURE TRAY) ×2 IMPLANT
PAD ARMBOARD 7.5X6 YLW CONV (MISCELLANEOUS) ×4 IMPLANT
PATCH VASC XENOSURE 1CMX6CM (Vascular Products) ×1 IMPLANT
PATCH VASC XENOSURE 1X6 (Vascular Products) IMPLANT
POSITIONER HEAD DONUT 9IN (MISCELLANEOUS) ×2 IMPLANT
STOPCOCK 4 WAY LG BORE MALE ST (IV SETS) IMPLANT
SUT ETHILON 3 0 PS 1 (SUTURE) IMPLANT
SUT MNCRL AB 4-0 PS2 18 (SUTURE) ×2 IMPLANT
SUT PROLENE 6 0 BV (SUTURE) ×4 IMPLANT
SUT PROLENE 7 0 BV1 MDA (SUTURE) ×1 IMPLANT
SUT SILK 3 0 (SUTURE)
SUT SILK 3-0 18XBRD TIE 12 (SUTURE) IMPLANT
SUT VIC AB 3-0 SH 27 (SUTURE) ×1
SUT VIC AB 3-0 SH 27X BRD (SUTURE) ×1 IMPLANT
SYR 20ML LL LF (SYRINGE) ×1 IMPLANT
SYR CONTROL 10ML LL (SYRINGE) IMPLANT
TOWEL GREEN STERILE (TOWEL DISPOSABLE) ×2 IMPLANT
TUBING ART PRESS 48 MALE/FEM (TUBING) IMPLANT
WATER STERILE IRR 1000ML POUR (IV SOLUTION) ×2 IMPLANT

## 2019-12-30 NOTE — Transfer of Care (Signed)
Immediate Anesthesia Transfer of Care Note  Patient: Kendra [name]  Procedure(s) Performed: LEFT CAROTID ENDARTERECTOMY (Left ) Patch Angioplasty Left Carotid (Left )  Patient Location: PACU  Anesthesia Type:General  Level of Consciousness: awake, alert  and patient cooperative  Airway & Oxygen Therapy: Patient Spontanous Breathing  Post-op Assessment: Report given to RN and Post -op Vital signs reviewed and stable  Post vital signs: Reviewed and stable  Last Vitals:  Vitals Value Taken Time  BP    Temp    Pulse    Resp 15 12/30/19 1520  SpO2    Vitals shown include unvalidated device data.  Last Pain:  Vitals:   12/30/19 1051  TempSrc:   PainSc: 9       Patients Stated Pain Goal: 3 ([id number])  Complications: No apparent anesthesia complications

## 2019-12-30 NOTE — H&P (Signed)
   History and Physical Update  The patient was interviewed and re-examined.  The patient's previous History and Physical has been reviewed and is unchanged from recent office visit. Plan for left CEA today in OR.   Jamerson Vonbargen C. Donzetta Matters, MD [facility] Office: [phone] Pager: [phone]   12/30/2019, 12:26 PM

## 2019-12-30 NOTE — Op Note (Signed)
° ° °  Patient name: Kendra [name] MRN: [id number] DOB: [birth year] Sex: female  12/30/2019 Pre-operative Diagnosis: asymptomatic left ica stenosis Post-operative diagnosis:  Same Surgeon:  Erlene Quan C. Donzetta Matters, MD Assistant: Arlee Muslim, PA Procedure Performed:  Left carotid endarterectomy with bovine pericardial patch angioplasty  Indications: 74 year old female with history of right ICA stroke status post right carotid endarterectomy.  She is now indicated for left side for string sign.  This is asymptomatic.  Findings: There is high-grade calcified stenosis right at the bifurcation.  This was all removed there was good feathering distally.  The shunt was placed this was 10 [place].  At completion there was good low resistance waveform in the ICA distally.  Patient was neurologically intact upon awakening.   Procedure:  The patient was identified in the holding area and taken to the operating room where she is placed in proper table general anesthesia was induced.  She was gently prepped draped in the left neck and chest in usual fashion antibiotics were minister timeout was called.  We began with incision along the anterior border the sternocleidomastoid.  This was reflected laterally.  We dissected down identified multiple veins which were divided between clips and ties.  We identified the common carotid artery placed a umbilical tape around this.  Patient was fully heparinized ACT returned greater than 300.  We dissected up on the external placed a vessel loop around both the superior thyroidal and the external carotid artery above this.  We identified the hypoglossal nerve protected this.  The ansa cervicalis was divided between clips.  We dissected higher on the ICA until it appeared normal placed a vessel loop around this.  Blood pressure was MAP of greater than 90 ACT had returned.  We prepared a 10 [place] shunt.  We clamped the ICA followed by common and external carotid arteries.  We opened the  vessel longitudinally.  The shunt was placed distally allowed to backbleed placed proximally.  Flow was confirmed with Doppler.  We proceeded with endarterectomy.  Distally we had good feathering although we did tack in 1 area.  We performed a patch angioplasty with a bovine pericardial patc.  Prior completion we removed our shunt.  We allowed antegrade backbleeding from all directions.  Upon completion we then released our clamp from our external followed by common after several cardiac cycles are internal carotid artery.  We did not repair 2 areas.  We had good signal distally.  We administered 50 mg of protamine.  Hemostasis was obtained we thoroughly irrigated the wound.  We closed the platysma with Vicryl followed by skin by Monocryl.  Dermabond is placed at the level of the skin.  She is awake from anesthesia having tolerated procedure well there may complication but all counts were correct at completion.  EBL: 200 cc     Aydeen Blume C. Donzetta Matters, MD [facility] [place] Office: [phone] Pager: [phone]

## 2019-12-30 NOTE — Anesthesia Procedure Notes (Signed)
Arterial Line Insertion Start/End4/13/2021 12:10 PM, 12/30/2019 12:15 PM Performed by: Wilburn Cornelia, CRNA, CRNA  Preanesthetic checklist: patient identified, IV checked, site marked, risks and benefits discussed, surgical consent, monitors and equipment checked, pre-op evaluation, timeout performed and anesthesia consent Lidocaine 1% used for infiltration Right, radial was placed Catheter size: 20 G Hand hygiene performed  and maximum sterile barriers used  Allen's test indicative of satisfactory collateral circulation Attempts: 1 Procedure performed without using ultrasound guided technique. Following insertion, Biopatch. Post procedure assessment: normal  Patient tolerated the procedure well with no immediate complications.

## 2019-12-30 NOTE — Anesthesia Preprocedure Evaluation (Addendum)
Anesthesia Evaluation  Patient identified by MRN, date of birth, ID band Patient awake    Reviewed: Allergy & Precautions, NPO status , Patient's Chart, lab work & pertinent test results  Airway Mallampati: II  TM Distance: >3 FB Neck ROM: Full    Dental  (+) Edentulous Upper, Edentulous Lower, Dental Advisory Given   Pulmonary shortness of breath, asthma , pneumonia, COPD, Current Smoker,     + decreased breath sounds      Cardiovascular hypertension, Pt. on medications  Rhythm:Regular Rate:Normal  Echo 10/17/2019  1. Left ventricular ejection fraction, by visual estimation, is 60 to 65%. The left ventricle has normal function. There is mildly increased left ventricular hypertrophy.  2. The left ventricle has no regional wall motion abnormalities.  3. Global right ventricle has normal systolic function.The right ventricular size is normal. No increase in right ventricular wall thickness.  4. Left atrial size was normal.  5. Right atrial size was normal.  6. The mitral valve is normal in structure. Mild mitral valve  regurgitation. No evidence of mitral stenosis.  7. The tricuspid valve is normal in structure.  8. The tricuspid valve is normal in structure. Tricuspid valve regurgitation is trivial.  9. The aortic valve is normal in structure. Aortic valve regurgitation is not visualized. Mild aortic valve sclerosis without stenosis.  10. The pulmonic valve was normal in structure. Pulmonic valve regurgitation is trivial.  11. Normal pulmonary artery systolic pressure.  12. The inferior vena cava is normal in size with greater than 50% respiratory variability, suggesting right atrial pressure of 3 mmHg.     Neuro/Psych PSYCHIATRIC DISORDERS Depression CVA    GI/Hepatic GERD  ,  Endo/Other  diabetes  Renal/GU      Musculoskeletal  (+) Arthritis ,   Abdominal   Peds  Hematology   Anesthesia Other Findings    Reproductive/Obstetrics                            Anesthesia Physical  Anesthesia Plan  ASA: III  Anesthesia Plan: General   Post-op Pain Management:    Induction: Intravenous  PONV Risk Score and Plan: 2 and Ondansetron, Dexamethasone and Treatment may vary due to age or medical condition  Airway Management Planned: Oral ETT  Additional Equipment: Arterial line  Intra-op Plan:   Post-operative Plan: Extubation in OR  Informed Consent: I have reviewed the patients History and Physical, chart, labs and discussed the procedure including the risks, benefits and alternatives for the proposed anesthesia with the patient or authorized representative who has indicated his/her understanding and acceptance.     Dental advisory given  Plan Discussed with: CRNA  Anesthesia Plan Comments:         Anesthesia Quick Evaluation

## 2019-12-30 NOTE — Progress Notes (Signed)
Pt received from PACU to 4e01. Oriented to room and call bell. CHG wipes applied. Pt connected to tele box and CCMD called. VSS. Call bell in reach. Will continue to monitor.  Arletta Bale, RN

## 2019-12-30 NOTE — Anesthesia Procedure Notes (Signed)
Procedure Name: Intubation Date/Time: 12/30/2019 1:22 PM Performed by: Harden Mo, CRNA Pre-anesthesia Checklist: Patient identified, Emergency Drugs available, Suction available and Patient being monitored Patient Re-evaluated:Patient Re-evaluated prior to induction Oxygen Delivery Method: Circle System Utilized Preoxygenation: Pre-oxygenation with 100% oxygen Induction Type: IV induction Ventilation: Mask ventilation without difficulty and Oral airway inserted - appropriate to patient size Laryngoscope Size: Sabra Heck and 2 Grade View: Grade I Tube type: Oral Tube size: 7.0 mm Number of attempts: 1 Airway Equipment and Method: Stylet and Oral airway Placement Confirmation: ETT inserted through vocal cords under direct vision,  positive ETCO2 and breath sounds checked- equal and bilateral Secured at: 21 cm Tube secured with: Tape Dental Injury: Teeth and Oropharynx as per pre-operative assessment

## 2019-12-30 NOTE — Progress Notes (Signed)
Pt type and screen came back positive for antibodies. Cherylann Ratel, CRNA and Dr. Donzetta Matters aware.

## 2019-12-31 LAB — CBC
HCT: 29.3 % — ABNORMAL LOW (ref 36.0–46.0)
Hemoglobin: 9.6 g/dL — ABNORMAL LOW (ref 12.0–15.0)
MCH: 31 pg (ref 26.0–34.0)
MCHC: 32.8 g/dL (ref 30.0–36.0)
MCV: 94.5 fL (ref 80.0–100.0)
Platelets: 216 10*3/uL (ref 150–400)
RBC: 3.1 MIL/uL — ABNORMAL LOW (ref 3.87–5.11)
RDW: 13.4 % (ref 11.5–15.5)
WBC: 6.8 10*3/uL (ref 4.0–10.5)
nRBC: 0 % (ref 0.0–0.2)

## 2019-12-31 LAB — GLUCOSE, CAPILLARY
Glucose-Capillary: 108 mg/dL — ABNORMAL HIGH (ref 70–99)
Glucose-Capillary: 104 mg/dL — ABNORMAL HIGH (ref 70–99)

## 2019-12-31 LAB — BASIC METABOLIC PANEL
Anion gap: 9 (ref 5–15)
BUN: 11 mg/dL (ref 8–23)
CO2: 24 mmol/L (ref 22–32)
Calcium: 9.3 mg/dL (ref 8.9–10.3)
Chloride: 103 mmol/L (ref 98–111)
Creatinine, Ser: 0.71 mg/dL (ref 0.44–1.00)
GFR calc Af Amer: >60 mL/min (ref >60)
GFR calc non Af Amer: >60 mL/min (ref >60)
Glucose, Bld: 119 mg/dL — ABNORMAL HIGH (ref 70–99)
Potassium: 4.3 mmol/L (ref 3.5–5.1)
Sodium: 136 mmol/L (ref 135–145)

## 2019-12-31 LAB — HEMOGLOBIN A1C
Hgb A1c MFr Bld: 5.9 % — ABNORMAL HIGH (ref 4.8–5.6)
Mean Plasma Glucose: 123 mg/dL

## 2019-12-31 LAB — POCT ACTIVATED CLOTTING TIME: Activated Clotting Time: 307 seconds

## 2019-12-31 MED ORDER — OXYCODONE-ACETAMINOPHEN 5-325 MG PO TABS: 1.0000 | ORAL_TABLET | Freq: Four times a day (QID) | ORAL | 0 refills | Status: DC | PRN

## 2019-12-31 NOTE — Progress Notes (Signed)
R radial Aline removed per order. Pt assisted to chair this AM, tolerated well.

## 2019-12-31 NOTE — Social Work (Signed)
CSW consulted for transportation needs. CSW met with the patient, introduced self and explained reason for the consult. Patient states she has no transportation home/family unable to come and pick her up. Patient signed [employer], retail Release of Liability (placed in chart).   RN updated to [facility] at [phone] when patient is ready for transport. Provide MRN number [id number] & cost center number .  Thurmond Butts, MSW, [place] Clinical Social Worker

## 2019-12-31 NOTE — Progress Notes (Signed)
Pt ambulated 470 feet in hallway with stand by assist. Pt tolerated well. Will continue to monitor.  Arletta Bale, RN

## 2019-12-31 NOTE — Progress Notes (Addendum)
[facility]  Subjective  - Doing well just a little throat soreness.   Objective 108/64 67 98 F (36.7 C) (Oral) 19 96%  Intake/Output Summary (Last 24 hours) at 12/31/2019 0710 Last data filed at 12/31/2019 0538 Gross per 24 hour  Intake 3669.17 ml  Output 725 ml  Net 2944.17 ml    Moving all 4 extremities Left neck incision healing well No tongue deviation, smile is symmetric  Assessment/Planning: POD #1 Left CEA No neurologic deficits Tolerating PO's well Once she ambulates she will be ready to be discharged home. F/U in 2 weeks for exam      Roxy Horseman 12/31/2019 7:10 AM --  Laboratory Lab Results: Recent Labs    12/30/19 1116 12/31/19 0450  WBC 5.5 6.8  HGB 12.4 9.6*  HCT 38.7 29.3*  PLT 272 216   BMET Recent Labs    12/30/19 1116 12/31/19 0450  NA 142 136  K 4.3 4.3  CL 106 103  CO2 28 24  GLUCOSE 107* 119*  BUN 9 11  CREATININE 0.76 0.71  CALCIUM 10.0 9.3    COAG Lab Results  Component Value Date   INR 1.1 12/30/2019   INR 1.1 10/16/2019   INR 1.27 04/24/2015   No results found for: PTT  I have independently interviewed and examined patient and agree with PA assessment and plan above.   Davine Coba C. Donzetta Matters, MD [facility] Office: [phone] Pager: [phone]

## 2019-12-31 NOTE — Discharge Instructions (Signed)
   [facility]  Discharge Instructions   Carotid Endarterectomy (CEA)  Please refer to the following instructions for your post-procedure care. Your surgeon or physician assistant will discuss any changes with you.  Activity  You are encouraged to walk as much as you can. You can slowly return to normal activities but must avoid strenuous activity and heavy lifting until your doctor tell you it's OK. Avoid activities such as vacuuming or swinging a golf club. You can drive after one week if you are comfortable and you are no longer taking prescription pain medications. It is normal to feel tired for serval weeks after your surgery. It is also normal to have difficulty with sleep habits, eating, and bowel movements after surgery. These will go away with time.  Bathing/Showering  You may shower after you come home. Do not soak in a bathtub, hot tub, or swim until the incision heals completely.  Incision Care  Shower every day. Clean your incision with mild soap and water. Pat the area dry with a clean towel. You do not need a bandage unless otherwise instructed. Do not apply any ointments or creams to your incision. You may have skin glue on your incision. Do not peel it off. It will come off on its own in about one week. Your incision may feel thickened and raised for several weeks after your surgery. This is normal and the skin will soften over time. For Men Only: It's OK to shave around the incision but do not shave the incision itself for 2 weeks. It is common to have numbness under your chin that could last for several months.  Diet  Resume your normal diet. There are no special food restrictions following this procedure. A low fat/low cholesterol diet is recommended for all patients with vascular disease. In order to heal from your surgery, it is CRITICAL to get adequate nutrition. Your body requires vitamins, minerals, and protein. Vegetables are the best  source of vitamins and minerals. Vegetables also provide the perfect balance of protein. Processed food has little nutritional value, so try to avoid this.        Medications  Resume taking all of your medications unless your doctor or physician assistant tells you not to. If your incision is causing pain, you may take over-the- counter pain relievers such as acetaminophen (Tylenol). If you were prescribed a stronger pain medication, please be aware these medications can cause nausea and constipation. Prevent nausea by taking the medication with a snack or meal. Avoid constipation by drinking plenty of fluids and eating foods with a high amount of fiber, such as fruits, vegetables, and grains. Do not take Tylenol if you are taking prescription pain medications.  Follow Up  Our office will schedule a follow up appointment 2-3 weeks following discharge.  Please call us immediately for any of the following conditions  Increased pain, redness, drainage (pus) from your incision site. Fever of 101 degrees or higher. If you should develop stroke (slurred speech, difficulty swallowing, weakness on one side of your body, loss of vision) you should call 911 and go to the nearest emergency room.  Reduce your risk of vascular disease:  Stop smoking. If you would like help call [facility] at 1-800-QUIT-NOW (1-800-784-8669) or [place] at [phone]. Manage your cholesterol Maintain a desired weight Control your diabetes Keep your blood pressure down  If you have any questions, please call the office at [phone].   

## 2019-12-31 NOTE — Progress Notes (Signed)
Discharge instructions given to patient. IV removed, clean and intact. Medications and wound care reviewed. All questions answered. Pt escorted home via [place].  Arletta Bale, RN

## 2019-12-31 NOTE — Discharge Summary (Signed)
[facility] Discharge Summary   Patient ID:  Kendra [name] MRN: [id number] DOB/AGE: Jul 31, 1946 74 y.o.  Admit date: 12/30/2019 Discharge date: 12/31/2019 Date of Surgery: 12/30/2019 Surgeon: Surgeon(s): [name], MD  Admission Diagnosis: Carotid artery stenosis [I65.29]  Discharge Diagnoses:  Carotid artery stenosis [I65.29]  Secondary Diagnoses: Past Medical History:  Diagnosis Date  . Asthma   . Back pain   . Cataract   . Cervical disc disorder   . COPD (chronic obstructive pulmonary disease) ([place])   . Depression   . Diabetes ([place])    type II  . Diabetic neuropathy ([place])   . Dyspnea    with exertion  . GERD (gastroesophageal reflux disease)   . History of kidney stones    per patient "can't remember the year"  . Hyperlipidemia   . Hypertension    per patient "take meds for it"  . Insomnia   . Neuropathy   . Osteoarthritis   . Osteopenia   . Pneumonia 2018   per patient  . Reflux   . Rheumatoid arthritis ([place])   . Stroke ([place]) 2015   and again 2017  - Weakness in left leg, staggers w/ walking, vision   . Tenosynovitis     Procedure(s): LEFT CAROTID ENDARTERECTOMY Patch Angioplasty Left Carotid  Discharged Condition: stable  HPI: Kendra [name] is a 74 y.o. female with status post right carotid endarterectomy for symptomatic disease.  She remains on aspirin Plavix and statin.  She was having some pain and called with concern underwent carotid duplex there were no issues with that.  She has no new neurologic events.   CTA of the neck revealed a string sign.  She was scheduled for left CEA.  Hospital Course:  Kendra [name] is a 74 y.o. female is S/P Right Procedure(s): LEFT CAROTID ENDARTERECTOMY Patch Angioplasty Left Carotid No neurologic deficits Tolerating PO's well Uneventful stay over night  Left neck incision without hematoma    Significant Diagnostic Studies: CBC Lab Results  Component Value  Date   WBC 6.8 12/31/2019   HGB 9.6 (L) 12/31/2019   HCT 29.3 (L) 12/31/2019   MCV 94.5 12/31/2019   PLT 216 12/31/2019    BMET    Component Value Date/Time   NA 136 12/31/2019 0450   NA 141 01/18/2016 1150   NA 138 09/01/2014 2130   K 4.3 12/31/2019 0450   K 3.5 09/01/2014 2130   CL 103 12/31/2019 0450   CL 104 09/01/2014 2130   CO2 24 12/31/2019 0450   CO2 28 09/01/2014 2130   GLUCOSE 119 (H) 12/31/2019 0450   GLUCOSE 287 (H) 09/01/2014 2130   BUN 11 12/31/2019 0450   BUN 12 01/18/2016 1150   BUN 14 09/01/2014 2130   CREATININE 0.71 12/31/2019 0450   CREATININE 0.80 09/30/2018 1151   CALCIUM 9.3 12/31/2019 0450   CALCIUM 8.9 09/01/2014 2130   GFRNONAA >60 12/31/2019 0450   GFRNONAA 75 09/30/2018 1151   GFRAA >60 12/31/2019 0450   GFRAA 87 09/30/2018 1151   COAG Lab Results  Component Value Date   INR 1.1 12/30/2019   INR 1.1 10/16/2019   INR 1.27 04/24/2015     Disposition:  Discharge to :Home Discharge Instructions    Call MD for:  redness, tenderness, or signs of infection (pain, swelling, bleeding, redness, odor or green/yellow discharge around incision site)   Complete by: As directed    Call MD for:  severe or increased pain, loss or  decreased feeling  in affected limb(s)   Complete by: As directed    Call MD for:  temperature >100.5   Complete by: As directed    Resume previous diet   Complete by: As directed      Allergies as of 12/31/2019      Reactions   Citalopram Nausea And Vomiting      Medication List    TAKE these medications   acetaminophen 500 MG tablet Commonly known as: TYLENOL Take 2 tablets (1,000 mg total) by mouth every 8 (eight) hours as needed for mild pain or moderate pain.   albuterol 108 (90 Base) MCG/ACT inhaler Commonly known as: VENTOLIN HFA INHALE 1-2 PUFFS EVERY 6 HOURS AS NEEDEDSHORTNESS OF BREATH AND WHEEZING   aspirin 325 MG tablet Take 325 mg by mouth daily.   atorvastatin 40 MG tablet Commonly known as:  LIPITOR Take 1 tablet (40 mg total) by mouth daily.   buPROPion 150 MG 24 hr tablet Commonly known as: WELLBUTRIN XL Take 1 tablet (150 mg total) by mouth daily.   clopidogrel 75 MG tablet Commonly known as: PLAVIX Take 1 tablet (75 mg total) by mouth daily. What changed: when to take this   glucose blood test strip Commonly known as: OneTouch Verio Use as instructed   Insulin Pen Needle 32G X 4 MM Misc Commonly known as: BD Pen Needle Nano U/F USE WITH LANTUS AS DIRECTED   meloxicam 15 MG tablet Commonly known as: MOBIC TAKE 1 TABLET BY MOUTH ONCE DAILY AS NEEDED   metFORMIN 500 MG 24 hr tablet Commonly known as: GLUCOPHAGE-XR TAKE 1 TABLET BY MOUTH TWICE DAILY BEFORE A MEAL   nicotine polacrilex 4 MG lozenge Commonly known as: COMMIT Take 4 mg by mouth as needed for smoking cessation.   ONE TOUCH DELICA LANCING DEV Misc 1 Device by Does not apply route 2 (two) times daily.   OneTouch Delica Lancets 61P Misc 1 Device by Does not apply route 2 (two) times daily.   OneTouch Verio w/Device Kit 1 kit by Does not apply route 2 (two) times daily.   oxyCODONE-acetaminophen 5-325 MG tablet Commonly known as: PERCOCET/ROXICET Take 1 tablet by mouth every 6 (six) hours as needed for moderate pain.   Trelegy Ellipta 100-62.5-25 MCG/INH Aepb Generic drug: Fluticasone-Umeclidin-Vilant Inhale 1 puff into the lungs daily.   Vitamin D3 50 MCG (2000 UT) capsule Take 1 capsule (2,000 Units total) by mouth daily.      Verbal and written Discharge instructions given to the patient. Wound care per Discharge AVS Follow-up Information    [name], MD Follow up in 2 week(s).   Specialties: Vascular Surgery, Cardiology Contact information: [place] [address] [phone]           Signed: Roxy Horseman 12/31/2019, 7:30 AM --- For VQI Registry use --- Instructions: Press F2 to tab through selections.  Delete question if not  applicable.   Modified Rankin score at D/C (0-6): Rankin Score=0  IV medication needed for:  1. Hypertension: No 2. Hypotension: No  Post-op Complications: No  1. Post-op CVA or TIA: No  If yes: Event classification (right eye, left eye, right cortical, left cortical, verterobasilar, other):   If yes: Timing of event (intra-op, <6 hrs post-op, >=6 hrs post-op, unknown):   2. CN injury: No  If yes: CN  injuried   3. Myocardial infarction: No  If yes: Dx by (EKG or clinical, Troponin):   4.  CHF: No  5.  Dysrhythmia (new): No  6. Wound infection: No  7. Reperfusion symptoms: No  8. Return to OR: No  If yes: return to OR for (bleeding, neurologic, other CEA incision, other):   Discharge medications: Statin use:  Yes ASA use:  Yes Beta blocker use:  No  for medical reason   ACE-Inhibitor use:  No  for medical reason   P2Y12 Antagonist use: '[ ]'$  None, [x ] Plavix, '[ ]'$  Plasugrel, '[ ]'$  Ticlopinine, '[ ]'$  Ticagrelor, '[ ]'$  Other, '[ ]'$  No for medical reason, '[ ]'$  Non-compliant, '[ ]'$  Not-indicated Anti-coagulant use:  [x ] None, '[ ]'$  Warfarin, '[ ]'$  Rivaroxaban, '[ ]'$  Dabigatran, '[ ]'$  Other, '[ ]'$  No for medical reason, '[ ]'$  Non-compliant, '[ ]'$  Not-indicated

## 2020-01-01 ENCOUNTER — Telehealth: Payer: Self-pay

## 2020-01-01 ENCOUNTER — Ambulatory Visit: Payer: Self-pay | Admitting: General Practice

## 2020-01-01 ENCOUNTER — Telehealth: Payer: Self-pay | Admitting: Family Medicine

## 2020-01-01 DIAGNOSIS — E118 Type 2 diabetes mellitus with unspecified complications: Secondary | ICD-10-CM

## 2020-01-01 DIAGNOSIS — Z794 Long term (current) use of insulin: Secondary | ICD-10-CM

## 2020-01-01 DIAGNOSIS — F324 Major depressive disorder, single episode, in partial remission: Secondary | ICD-10-CM | POA: Diagnosis not present

## 2020-01-01 DIAGNOSIS — E785 Hyperlipidemia, unspecified: Secondary | ICD-10-CM

## 2020-01-01 DIAGNOSIS — I10 Essential (primary) hypertension: Secondary | ICD-10-CM | POA: Diagnosis not present

## 2020-01-01 DIAGNOSIS — J449 Chronic obstructive pulmonary disease, unspecified: Secondary | ICD-10-CM | POA: Diagnosis not present

## 2020-01-01 DIAGNOSIS — Z09 Encounter for follow-up examination after completed treatment for conditions other than malignant neoplasm: Secondary | ICD-10-CM

## 2020-01-01 DIAGNOSIS — E1169 Type 2 diabetes mellitus with other specified complication: Secondary | ICD-10-CM

## 2020-01-01 NOTE — Anesthesia Postprocedure Evaluation (Signed)
Anesthesia Post Note  Patient: Kendra [name]  Procedure(s) Performed: LEFT CAROTID ENDARTERECTOMY (Left ) Patch Angioplasty Left Carotid (Left )     Patient location during evaluation: PACU Anesthesia Type: General Level of consciousness: sedated and patient cooperative Pain management: pain level controlled Vital Signs Assessment: post-procedure vital signs reviewed and stable Respiratory status: spontaneous breathing Cardiovascular status: stable Anesthetic complications: no    Last Vitals:  Vitals:   12/31/19 1128 12/31/19 1139  BP: 105/73 105/73  Pulse: 78 78  Resp: 15 15  Temp: 36.6 C 36.6 C  SpO2: 98% 98%    Last Pain:  Vitals:   12/31/19 1128  TempSrc: Oral  PainSc:                  [name]

## 2020-01-01 NOTE — Telephone Encounter (Signed)
   K[facility] 01/01/2020  Name: Kendra [name]   MRN: [id number]   DOB: 08-16-1946   AGE: 74 y.o.   GENDER: female   PCP No primary care provider on file.Hulen Skains pt regarding Community Resource Referral for help with housing and eviction from landlord. Pt stated that her family helped her get caught up on her rent. Inquired about meals and she stated that [facility] is still giving her 15 meals a week plus she has MOW. She stated that she is caught up on all other bills. Gave my contact information to reach out if she has any further needs. Closing referral pending any other needs of patient.  [place] . [email or web address]  [phone]

## 2020-01-01 NOTE — Chronic Care Management (AMB) (Signed)
Chronic Care Management   Follow Up Note   01/01/2020 Name: Kendra [name] MRN: [id number] DOB: 1946/04/22  Referred by: No primary care provider on file. Reason for referral : Chronic Care Management (Post discharge assessment and f/u on Chronic disease management and care coordination needs. )   Kendra [name] is a 74 y.o. year old female who is a primary care patient of No primary care provider on file.. The CCM team was consulted for assistance with chronic disease management and care coordination needs.    Review of patient status, including review of consultants reports, relevant laboratory and other test results, and collaboration with appropriate care team members and the patient's provider was performed as part of comprehensive patient evaluation and provision of chronic care management services.    SDOH (Social Determinants of Health) assessments performed: No See Care Plan activities for detailed interventions related to [facility])     Outpatient Encounter Medications as of 01/01/2020  Medication Sig  . acetaminophen (TYLENOL) 500 MG tablet Take 2 tablets (1,000 mg total) by mouth every 8 (eight) hours as needed for mild pain or moderate pain.  [name] albuterol (VENTOLIN HFA) 108 (90 Base) MCG/ACT inhaler INHALE 1-2 PUFFS EVERY 6 HOURS AS NEEDEDSHORTNESS OF BREATH AND WHEEZING  . aspirin 325 MG tablet Take 325 mg by mouth daily.  [name] atorvastatin (LIPITOR) 40 MG tablet Take 1 tablet (40 mg total) by mouth daily.  . Blood Glucose Monitoring Suppl (ONETOUCH VERIO) w/Device KIT 1 kit by Does not apply route 2 (two) times daily.  [name] buPROPion (WELLBUTRIN XL) 150 MG 24 hr tablet Take 1 tablet (150 mg total) by mouth daily.  . Cholecalciferol (VITAMIN D3) 50 MCG (2000 UT) capsule Take 1 capsule (2,000 Units total) by mouth daily.  . clopidogrel (PLAVIX) 75 MG tablet Take 1 tablet (75 mg total) by mouth daily. (Patient taking differently: Take 75 mg by mouth daily in the afternoon. )  .  Fluticasone-Umeclidin-Vilant (TRELEGY ELLIPTA) 100-62.5-25 MCG/INH AEPB Inhale 1 puff into the lungs daily.  [name] glucose blood (ONETOUCH VERIO) test strip Use as instructed  . Insulin Pen Needle (BD PEN NEEDLE NANO U/F) 32G X 4 MM MISC USE WITH LANTUS AS DIRECTED  . Lancet Devices (ONE TOUCH DELICA LANCING DEV) MISC 1 Device by Does not apply route 2 (two) times daily.  . meloxicam (MOBIC) 15 MG tablet TAKE 1 TABLET BY MOUTH ONCE DAILY AS NEEDED (Patient not taking: Reported on 11/28/2019)  . metFORMIN (GLUCOPHAGE-XR) 500 MG 24 hr tablet TAKE 1 TABLET BY MOUTH TWICE DAILY BEFORE A MEAL  . nicotine polacrilex (COMMIT) 4 MG lozenge Take 4 mg by mouth as needed for smoking cessation.  [name] Lancets 88F MISC 1 Device by Does not apply route 2 (two) times daily.  [name] oxyCODONE-acetaminophen (PERCOCET/ROXICET) 5-325 MG tablet Take 1 tablet by mouth every 6 (six) hours as needed for moderate pain.   No facility-administered encounter medications on file as of 01/01/2020.     Objective:   Goals Addressed            This Visit's Progress   . RNCM" Pt-"They put me back on insulin at the hospital but I don't have any insulin to take" (pt-stated)       CARE PLAN ENTRY (see longitudinal plan of care for additional care plan information)  Current Barriers:  [name] Knowledge Deficits related to Diabetes management as evidence of no glucose meter to check blood sugars and the patient verbalizing she was  told to take insulin after hospital discharge but the hopsital did not send her home with insulin or a script. The discharge paperwork does state needles for use of Lantus but no guidelines on how many units or frequency.  Leodis Liverpool caregiver support.  . Film/video editor.  . Literacy barriers . Transportation barriers . Difficulty obtaining medications  Nurse Case Manager Clinical Goal(s):  [name] Over the next 60 days, patient will verbalize understanding of plan for diabetes management and control  of diabetes.  . Over the next 7 days, patient will work with [facility], Pharmacist and pcp to address needs related to discrepancies in the discharge orders and locating the patients glucose meter that was ordered in March per the patient by [name].  . Over the next 60 days, patient will demonstrate improved adherence to prescribed treatment plan for diabetes  as evidenced bytaking glucose readings as ordered and following medication regimen for diabetes management.  . Over the next 7 days, patient will work with CM team pharmacist to address issue with no Lantus instructions on the discharge orders and location of new glucose meter.   Interventions:  . Inter-disciplinary care team collaboration (see longitudinal plan of care) . Evaluation of current treatment plan related to diabetes  and patient's adherence to plan as established by provider. . Advised patient to wait for the [facility] or pharmacist to call her to determine how much her insulin dose is supposed to be and instructions by the MD.  . Provided education to patient re: extensive education on the need to find where the glucose meter is and to take blood sugars especially if she is going to be taking insulin. The patient verbalized understanding of talking with the CCM team before taking insulin. The patient states she does not have any Lantus or other insulin to take.  . Reviewed medications with patient and discussed: discovered that there were orders for lancets and syringes to use to administer "Lantus" but no directions on how many units or how often she needed to take.  [name] Collaborated with pcp by secure chat regarding the missing insulin and problem with the glucose meter.  . Reviewed scheduled/upcoming provider appointments including: pcp on May 3 . Advised patient, providing education and rationale, to check cbg BID and record, calling pcp for findings outside established parameters.  The patient currently does not have a blood glucose meter  to check her blood sugars.  . Called [facility] in [place] and talked to [place]. He advised the [facility] that there were not orders for the patient there and had not been in the year 2021. . Called [name] and spoke to the pharmacist the pharmacist does not have an order for a glucose meter and said that the Verio is likely not covered by her insurance plan but would be happy to fill an order for one with a script from the pcp. Loraine Maple the patient about the glucose meter. The [facility] nurse told her that her old one was cracked and she needed a new one. The patient verbalized she talked to the "lady at [name]" in March and it was ordered. She has not gotten the new meter or heard from them.  She could not provide information concerning the meter. Will collaborate with the CCM team pharmacist on 01/02/2020 to determine if the meter can be located.  [name] Pharmacy referral for help with clarification of Lantus and help with location of order for new glucose meter.   Patient Self Care Activities:  .  Patient verbalizes understanding of plan to have the CCM team contact her with further instructions concerning the discrepancies with insulin and glucose meter for testing blood sugars.  . Self administers medications as prescribed . Attends all scheduled provider appointments . Performs IADL's independently . Calls provider office for new concerns or questions . Unable to independently manage diabetes  . Unable to self administer medications as prescribed . Unable to perform IADLs independently . Does not contact provider office for questions/concerns  Initial goal documentation     . RNCM-My headaches are worse, my blood pressure is up and down (pt-stated)       Current Barriers:  [name] Knowledge Deficits related to basic understanding of hypertension pathophysiology and self care management . Knowledge Deficits related to understanding of medications prescribed for management of hypertension . Non-adherence  to prescribed medication regimen . Limited ability to follow a low sodium diet . Film/video editor.  . Transportation barrier  Case Manager Clinical Goal(s):  [name] Over the next 90 days, patient will demonstrate improved adherence to prescribed treatment plan for hypertension as evidenced by taking all medications as prescribed, monitoring and recording blood pressure as directed, adhering to low sodium/DASH diet . Over the next 90 days the patient will utilize the [employer] with the ability to prepare heart healthy/low sodium diet in home setting . Over the next 30 days the patient will have follow up appointments with pcp and surgeon post right endarterectomy and hospitalization recently  Interventions:  . Evaluation of current treatment plan related to hypertension self management and patient's adherence to plan as established by provider. . Discussed plans with patient for ongoing care management follow up and provided patient with direct contact information for care management team . Advised patient, providing education and rationale, to monitor blood pressure daily and record, the patient stated the pcp told her to go to the ED for SBP>199, see updated vital sign record for readings over the last several days . Advised patient to call [facility] for the "Healthy Foods Benefit" ($55.00) credit per month for buying food  . Verbal order received from [name] for [facility] nurse to continue to come in and assess/evaluate the patient post hospitalization for elevated blood pressure and other healthcare needs . Patient continues to check b/p BID and is following pharmacy recommendations to recheck after 53mns if it is high. The patient is keeping a log and can tell the RNCM her readings . Evaluated heart healthy diet with the patient. The patient verbalized she will be getting 15 meals a week that are low sodium and this switch will take place at the end of the week.   . Evaluated patients stress level as she is going in to have left endarterectomy on 12/09/2019.  The patient is trying to get ready for that while also dealing with a note from her landlord saying she is going to be evicted. The patient was supposed to go to the [employer] [facility] out paperwork. The patient states they will not let her son do the paperwork for her. Secure message sent to LCSW and the team for recommendations and support. Will continue to monitor. Completed  . Care guide referral for assistance and help with eviction notice. Completed . Post discharge assessment for discharge on 12/31/2019 post left endarterectomy.  The patient states she is doing well. Could tell the RNCM if she had swelling, drainage or redness to call the doctor.  The patient denies any pain  but has pain medications to take as directed for pain. The patient will follow up with Dr. Donzetta Matters on 01-23-2020.  Post follow up with pcp on 01-19-2020.   BP Readings from Last 3 Encounters:  12/31/19 105/73  12/24/19 107/81  11/14/19 110/71          Patient Self Care Activities:  . UNABLE to independently manage worsening headaches . Checks BP and records as discussed  Please see past updates related to this goal by clicking on the "Past Updates" button in the selected goal           Plan:   The care management team will reach out to the patient again over the next 5 days.    [name] RN, MSN, [place] Mobile: [phone]

## 2020-01-01 NOTE — Patient Instructions (Signed)
Visit Information  Goals Addressed            This Visit's Progress    RNCM" Pt-"They put me back on insulin at the hospital but I don't have any insulin to take" (pt-stated)       CARE PLAN ENTRY (see longitudinal plan of care for additional care plan information)  Current Barriers:   Knowledge Deficits related to Diabetes management as evidence of no glucose meter to check blood sugars and the patient verbalizing she was told to take insulin after hospital discharge but the hopsital did not send her home with insulin or a script. The discharge paperwork does state needles for use of Lantus but no guidelines on how many units or frequency.   Lacks caregiver support.   Film/video editor.   Literacy barriers  Transportation barriers  Difficulty obtaining medications  Nurse Case Manager Clinical Goal(s):   Over the next 60 days, patient will verbalize understanding of plan for diabetes management and control of diabetes.   Over the next 7 days, patient will work with [facility], Pharmacist and pcp to address needs related to discrepancies in the discharge orders and locating the patients glucose meter that was ordered in March per the patient by [facility].   Over the next 60 days, patient will demonstrate improved adherence to prescribed treatment plan for diabetes  as evidenced bytaking glucose readings as ordered and following medication regimen for diabetes management.   Over the next 7 days, patient will work with CM team pharmacist to address issue with no Lantus instructions on the discharge orders and location of new glucose meter.   Interventions:   Inter-disciplinary care team collaboration (see longitudinal plan of care)  Evaluation of current treatment plan related to diabetes  and patient's adherence to plan as established by provider.  Advised patient to wait for the [facility] or pharmacist to call her to determine how much her insulin dose is supposed to be and  instructions by the MD.   Provided education to patient re: extensive education on the need to find where the glucose meter is and to take blood sugars especially if she is going to be taking insulin. The patient verbalized understanding of talking with the CCM team before taking insulin. The patient states she does not have any Lantus or other insulin to take.   Reviewed medications with patient and discussed: discovered that there were orders for lancets and syringes to use to administer "Lantus" but no directions on how many units or how often she needed to take.   Collaborated with pcp by secure chat regarding the missing insulin and problem with the glucose meter.   Reviewed scheduled/upcoming provider appointments including: pcp on May 3  Advised patient, providing education and rationale, to check cbg BID and record, calling pcp for findings outside established parameters.  The patient currently does not have a blood glucose meter to check her blood sugars.   [facility] [place] and [facility] [place]. He advised the [name] that there were not orders for the patient there and had not been in the year 2021.  Called [name] and spoke to the pharmacist the pharmacist does not have an order for a glucose meter and said that the Verio is likely not covered by her insurance plan but would be happy to fill an order for one with a script from the pcp.  Questioned the patient about the glucose meter. The [facility] nurse told her that her old one was  cracked and she needed a new one. The patient verbalized she talked to the "lady at [facility]" in March and it was ordered. She has not gotten the new meter or heard from them.  She could not provide information concerning the meter. Will collaborate with the CCM team pharmacist on 01/02/2020 to determine if the meter can be located.   Pharmacy referral for help with clarification of Lantus and help with location of order for new glucose meter.   Patient  Self Care Activities:   Patient verbalizes understanding of plan to have the CCM team contact her with further instructions concerning the discrepancies with insulin and glucose meter for testing blood sugars.   Self administers medications as prescribed  Attends all scheduled provider appointments  Performs IADL's independently  Calls provider office for new concerns or questions  Unable to independently manage diabetes   Unable to self administer medications as prescribed  Unable to perform IADLs independently  Does not contact provider office for questions/concerns  Initial goal documentation      RNCM-My headaches are worse, my blood pressure is up and down (pt-stated)       Current Barriers:   Knowledge Deficits related to basic understanding of hypertension pathophysiology and self care management  Knowledge Deficits related to understanding of medications prescribed for management of hypertension  Non-adherence to prescribed medication regimen  Limited ability to follow a low sodium diet  Financial Constraints.   Transportation barrier  Case Manager Clinical Goal(s):   Over the next 90 days, patient will demonstrate improved adherence to prescribed treatment plan for hypertension as evidenced by taking all medications as prescribed, monitoring and recording blood pressure as directed, adhering to low sodium/DASH diet  Over the next 90 days the patient will utilize the [employer] with the ability to prepare heart healthy/low sodium diet in home setting  Over the next 30 days the patient will have follow up appointments with pcp and surgeon post right endarterectomy and hospitalization recently  Interventions:   Evaluation of current treatment plan related to hypertension self management and patient's adherence to plan as established by provider.  Discussed plans with patient for ongoing care management follow up and provided patient with direct  contact information for care management team  Advised patient, providing education and rationale, to monitor blood pressure daily and record, the patient stated the pcp told her to go to the ED for SBP>199, see updated vital sign record for readings over the last several days  Advised patient to call [facility] for the "Healthy Foods Benefit" ($55.00) credit per month for buying food   Verbal order received from [name] for [facility] nurse to continue to come in and assess/evaluate the patient post hospitalization for elevated blood pressure and other healthcare needs  Patient continues to check b/p BID and is following pharmacy recommendations to recheck after 41mins if it is high. The patient is keeping a log and can tell the RNCM her readings  Evaluated heart healthy diet with the patient. The patient verbalized she will be getting 15 meals a week that are low sodium and this switch will take place at the end of the week.   Evaluated patients stress level as she is going in to have left endarterectomy on 12/09/2019.  The patient is trying to get ready for that while also dealing with a note from her landlord saying she is going to be evicted. The patient was supposed to go to the [employer]  and fill out paperwork. The patient states they will not let her son do the paperwork for her. Secure message sent to LCSW and the team for recommendations and support. Will continue to monitor. Completed   Care guide referral for assistance and help with eviction notice. Completed  Post discharge assessment for discharge on 12/31/2019 post left endarterectomy.  The patient states she is doing well. Could tell the RNCM if she had swelling, drainage or redness to call the doctor.  The patient denies any pain but has pain medications to take as directed for pain. The patient will follow up with Dr. Donzetta Matters on 01-23-2020.  Post follow up with pcp on 01-19-2020.   BP Readings from Last 3 Encounters:   12/31/19 105/73  12/24/19 107/81  11/14/19 110/71          Patient Self Care Activities:   UNABLE to independently manage worsening headaches  Checks BP and records as discussed  Please see past updates related to this goal by clicking on the "Past Updates" button in the selected goal          Patient verbalizes understanding of instructions provided today.   The care management team will reach out to the patient again over the next 5 days.   [name] RN, MSN, [place] Mobile: [phone]

## 2020-01-01 NOTE — Chronic Care Management (AMB) (Signed)
°  Chronic Care Management   Outreach Note  01/01/2020 Name: Kendra [name] MRN: [id number] DOB: 1946-06-30  Referred by: [name], NP (Inactive) Reason for referral : Chronic Care Management (Post discharge on 4/14 from hospital post endarectomy and follow up on Chronic conditions - attempt)   An unsuccessful telephone outreach was attempted today. The patient was referred to the case management team for assistance with care management and care coordination. The patient answered but stated her throat was a little sore where she was in the hospital (endarectomy) and she had rather do the call another day. The patient states she is doing well but just did not want to complete the call today.   Follow Up Plan: The care management team will reach out to the patient again over the next 7 to 14 days.   [name] RN, MSN, [place] Mobile: [phone]

## 2020-01-02 ENCOUNTER — Ambulatory Visit: Payer: Self-pay | Admitting: Pharmacist

## 2020-01-02 ENCOUNTER — Other Ambulatory Visit: Payer: Self-pay | Admitting: Family Medicine

## 2020-01-02 ENCOUNTER — Telehealth: Payer: Self-pay | Admitting: Physician Assistant

## 2020-01-02 ENCOUNTER — Other Ambulatory Visit: Payer: Self-pay | Admitting: Vascular Surgery

## 2020-01-02 DIAGNOSIS — IMO0002 Reserved for concepts with insufficient information to code with codable children: Secondary | ICD-10-CM

## 2020-01-02 DIAGNOSIS — Z794 Long term (current) use of insulin: Secondary | ICD-10-CM

## 2020-01-02 DIAGNOSIS — I10 Essential (primary) hypertension: Secondary | ICD-10-CM

## 2020-01-02 DIAGNOSIS — E118 Type 2 diabetes mellitus with unspecified complications: Secondary | ICD-10-CM

## 2020-01-02 DIAGNOSIS — E1165 Type 2 diabetes mellitus with hyperglycemia: Secondary | ICD-10-CM

## 2020-01-02 MED ORDER — OXYCODONE-ACETAMINOPHEN 5-325 MG PO TABS: 1.0000 | ORAL_TABLET | ORAL | 0 refills | Status: DC | PRN

## 2020-01-02 MED ORDER — ONETOUCH DELICA LANCING DEV MISC: 1.0000 | Freq: Two times a day (BID) | 4 refills | Status: DC

## 2020-01-02 MED ORDER — ONETOUCH VERIO W/DEVICE KIT: 1.0000 | PACK | Freq: Two times a day (BID) | 0 refills | Status: DC

## 2020-01-02 MED ORDER — ONETOUCH VERIO VI STRP: ORAL_STRIP | 4 refills | Status: DC

## 2020-01-02 MED ORDER — HYDROCODONE-ACETAMINOPHEN 5-325 MG PO TABS: 1.0000 | ORAL_TABLET | Freq: Four times a day (QID) | ORAL | 0 refills | Status: DC | PRN

## 2020-01-02 NOTE — Chronic Care Management (AMB) (Addendum)
Chronic Care Management   Follow Up Note   01/02/2020 Name: Kendra [name] MRN: [id number] DOB: 04-07-1946  Referred by: No primary care provider on file. Reason for referral : Chronic Care Management (Patient Phone Call)   Kendra [name] is a 74 y.o. year old female who is a primary care patient of No primary care provider on file.. The CCM team was consulted for assistance with chronic disease management and care coordination needs.  Kendra [name] has a medical history which includes but is not limited to recent CVA (11/2018), Type 2 Diabetes, hypertension, hyperlipidemia and COPD.   Receive coordination of care message from CCM Nurse Case Manager requesting follow up call to patient regarding diabetes management.  I reached out to Kendra [name] by phone today.   Review of patient status, including review of consultants reports, relevant laboratory and other test results, and collaboration with appropriate care team members and the patient's provider was performed as part of comprehensive patient evaluation and provision of chronic care management services.     Objective  Lab Results  Component Value Date   HGBA1C 5.9 (H) 12/30/2019    Lab Results  Component Value Date   CREATININE 0.71 12/31/2019   BUN 11 12/31/2019   NA 136 12/31/2019   K 4.3 12/31/2019   CL 103 12/31/2019   CO2 24 12/31/2019    Lab Results  Component Value Date   CHOL 93 10/17/2019   HDL 40 (L) 10/17/2019   LDLCALC 37 10/17/2019   TRIG 79 10/17/2019   CHOLHDL 2.3 10/17/2019     Outpatient Encounter Medications as of 01/02/2020  Medication Sig  . metFORMIN (GLUCOPHAGE-XR) 500 MG 24 hr tablet TAKE 1 TABLET BY MOUTH TWICE DAILY BEFORE A MEAL  . acetaminophen (TYLENOL) 500 MG tablet Take 2 tablets (1,000 mg total) by mouth every 8 (eight) hours as needed for mild pain or moderate pain.  [name] albuterol (VENTOLIN HFA) 108 (90 Base) MCG/ACT inhaler INHALE 1-2 PUFFS EVERY 6 HOURS AS NEEDEDSHORTNESS  OF BREATH AND WHEEZING  . aspirin 325 MG tablet Take 325 mg by mouth daily.  [name] atorvastatin (LIPITOR) 40 MG tablet Take 1 tablet (40 mg total) by mouth daily.  [name] buPROPion (WELLBUTRIN XL) 150 MG 24 hr tablet Take 1 tablet (150 mg total) by mouth daily.  . Cholecalciferol (VITAMIN D3) 50 MCG (2000 UT) capsule Take 1 capsule (2,000 Units total) by mouth daily.  . clopidogrel (PLAVIX) 75 MG tablet Take 1 tablet (75 mg total) by mouth daily. (Patient taking differently: Take 75 mg by mouth daily in the afternoon. )  . Fluticasone-Umeclidin-Vilant (TRELEGY ELLIPTA) 100-62.5-25 MCG/INH AEPB Inhale 1 puff into the lungs daily.  . meloxicam (MOBIC) 15 MG tablet TAKE 1 TABLET BY MOUTH ONCE DAILY AS NEEDED (Patient not taking: Reported on 11/28/2019)  . nicotine polacrilex (COMMIT) 4 MG lozenge Take 4 mg by mouth as needed for smoking cessation.  Kendra [name] Lancets 57W MISC 1 Device by Does not apply route 2 (two) times daily.  [name] oxyCODONE-acetaminophen (PERCOCET/ROXICET) 5-325 MG tablet Take 1 tablet by mouth every 6 (six) hours as needed for moderate pain.  . [DISCONTINUED] Blood Glucose Monitoring Suppl (ONETOUCH VERIO) w/Device KIT 1 kit by Does not apply route 2 (two) times daily.  . [DISCONTINUED] glucose blood (ONETOUCH VERIO) test strip Use as instructed  . [DISCONTINUED] Insulin Pen Needle (BD PEN NEEDLE NANO U/F) 32G X 4 MM MISC USE WITH LANTUS AS DIRECTED (Patient not taking: Reported on  01/02/2020)  . [DISCONTINUED] Lancet Devices (ONE TOUCH [name] LANCING DEV) MISC 1 Device by Does not apply route 2 (two) times daily.   No facility-administered encounter medications on file as of 01/02/2020.    Goals Addressed            This Visit's Progress   . PharmD-Medication Adherence       Current Barriers:  . Financial Barriers . Knowledge deficits related to coordination of her own care . Limited social support   Pharmacist Clinical Goal(s):   [name] Over the next 30 days, patient will  demonstrate improved medication adherence as evidenced by verbalized understanding of prescribed medication regimen, assistance available, and patient report of adherence  Interventions: . Perform chart review, patient admitted 4/13 to 4/14 for left carotid endarterectomy procedure  ? Note A1C on 4/13: 5.9% . Collaborate with CCM Nurse Case Manager- Reports patient had concern from her discharge paperwork as Insulin Pen needles with direction "Use with Lantus as directed" remained on her medication list. ? Note patient previously used insulin pen needle with Lantus Rx. Lantus Rx discontinued by [name] on 08/27/19. ? Update medication list to remove pen needle Rx today . Counsel on continued importance of blood sugar control  ? Reports taking metformin ER 500 mg twice daily from pill pack ? Reports recently unable to check home CBGs as glucometer meter is cracked  . Counsel patient on continued importance of blood pressure monitoring and control ? Note patient remains off of antihypertensive medication  . Counsel patient again regarding importance of obtaining new upper arm BP monitor to resume monitoring ? Confirms having health benefit OTC card ? Reports she will use card to obtain new upper arm monitor today or this weekend . Collaborate with PCP and request new Rx for One Touch Verio meter and supplies to patient's pharmacy . Collaborate with Jeannie Fend at [facility]. Confirm One Touch Verio meter and supplies went through [employer] and will be delivered to patient . Will mail patient new BP log, CBG log and BP monitoring handout as requested  Patient Self Care Activities:  . Patient takes medications as directed with aid of adherence tools. o Using pill packaging from [facility] . Calls pharmacy for medication refills . Patient to attend scheduled medical appointments o Left carotid endarterectomy procedure rescheduled for 4/13 . Calls provider office for new  concerns or questions  . Patient to check blood sugars and keep log . Patient to check blood pressure daily and keep log  Please see past updates related to this goal by clicking on the "Past Updates" button in the selected goal          Plan  The care management team will reach out to the patient again over the next 30 days.   [name], PharmD, [place] [employer] [phone]

## 2020-01-02 NOTE — Progress Notes (Signed)
Request for new meter and supplies.  Refill sent.

## 2020-01-02 NOTE — Patient Instructions (Addendum)
Thank you allowing the Chronic Care Management Team to be a part of your care! It was a pleasure speaking with you today!     CCM (Chronic Care Management) Team    [name] RN, MSN, CCM Nurse Care Coordinator  [phone]   [name] PharmD  Clinical Pharmacist  [phone]   [name] LCSW Clinical Social Worker [phone]  Visit Information  Goals Addressed            This Visit's Progress   . PharmD-Medication Adherence       Current Barriers:  . Financial Barriers . Knowledge deficits related to coordination of her own care . Limited social support   Pharmacist Clinical Goal(s):   [name] Over the next 30 days, patient will demonstrate improved medication adherence as evidenced by verbalized understanding of prescribed medication regimen, assistance available, and patient report of adherence  Interventions: . Perform chart review, patient admitted 4/13 to 4/14 for left carotid endarterectomy procedure  ? Note A1C on 4/13: 5.9% . Collaborate with CCM Nurse Case Manager- Reports patient had concern from her discharge paperwork as Insulin Pen needles with direction "Use with Lantus as directed" remained on her medication list. ? Note patient previously used insulin pen needle with Lantus Rx. Lantus Rx discontinued by [name] on 08/27/19. ? Update medication list to remove pen needle Rx today . Counsel on continued importance of blood sugar control  ? Reports taking metformin ER 500 mg twice daily from pill pack ? Reports recently unable to check home CBGs as glucometer meter is cracked  . Counsel patient on continued importance of blood pressure monitoring and control ? Note patient remains off of antihypertensive medication  . Counsel patient again regarding importance of obtaining new upper arm BP monitor to resume monitoring ? Confirms having health benefit OTC card ? Reports she will use card to obtain new upper arm monitor today or this  weekend . Collaborate with PCP and request new Rx for One Touch Verio meter and supplies to patient's pharmacy . Collaborate with Jeannie Fend at [facility]. Confirm One Touch Verio meter and supplies went through [employer] and will be delivered to patient  Patient Self Care Activities:  . Patient takes medications as directed with aid of adherence tools. o Using pill packaging from [facility] . Calls pharmacy for medication refills . Patient to attend scheduled medical appointments o Left carotid endarterectomy procedure rescheduled for 4/13 . Calls provider office for new concerns or questions  . Patient to check blood sugars and keep log . Patient to check blood pressure daily and keep log  Please see past updates related to this goal by clicking on the "Past Updates" button in the selected goal          Patient verbalizes understanding of instructions provided today.   The care management team will reach out to the patient again over the next 30 days.   [name], PharmD, [place] [employer] [phone]

## 2020-01-02 NOTE — Telephone Encounter (Signed)
The patient is status post carotid endarterectomy 2 days ago.  She telephoned the office stating she had dropped her pain medication into the toilet.  I telephoned her at home and she states this is indeed what happened.  She is complaining of some minor throat pain and minor headache.  She continues to have significant incisional pain and I therefore told her I would refill her prescription.  I asked that she keep it safe and in her possession only. Follow- up as previously arranged

## 2020-01-06 ENCOUNTER — Telehealth: Payer: Self-pay | Admitting: Physician Assistant

## 2020-01-06 NOTE — Telephone Encounter (Signed)
   Patient called and reported left neck swelling.  S/P left CEA 12/31/19.  We will scheduled her and appointment for exam and possible hematoma.   Roxy Horseman PA-C

## 2020-01-07 ENCOUNTER — Telehealth (HOSPITAL_COMMUNITY): Payer: Self-pay

## 2020-01-07 NOTE — Telephone Encounter (Signed)

## 2020-01-08 ENCOUNTER — Other Ambulatory Visit: Payer: Self-pay

## 2020-01-08 ENCOUNTER — Ambulatory Visit: Payer: Self-pay | Admitting: Pharmacist

## 2020-01-08 ENCOUNTER — Ambulatory Visit (INDEPENDENT_AMBULATORY_CARE_PROVIDER_SITE_OTHER): Payer: Self-pay | Admitting: Physician Assistant

## 2020-01-08 ENCOUNTER — Telehealth: Payer: Self-pay

## 2020-01-08 VITALS — BP 118/70 | HR 90 | Temp 98.2°F | Resp 16 | Ht 63.0 in | Wt 119.4 lb

## 2020-01-08 DIAGNOSIS — I6523 Occlusion and stenosis of bilateral carotid arteries: Secondary | ICD-10-CM

## 2020-01-08 DIAGNOSIS — I10 Essential (primary) hypertension: Secondary | ICD-10-CM

## 2020-01-08 NOTE — Patient Instructions (Signed)
Thank you allowing the Chronic Care Management Team to be a part of your care! It was a pleasure speaking with you today!     CCM (Chronic Care Management) Team    [name] RN, MSN, CCM Nurse Care Coordinator  [phone]   [name] PharmD  Clinical Pharmacist  [phone]   [name] LCSW Clinical Social Worker [phone]  Visit Information  Goals Addressed            This Visit's Progress   . PharmD-Medication Adherence       Current Barriers:  . Financial Barriers . Knowledge deficits related to coordination of her own care . Limited social support   Pharmacist Clinical Goal(s):   [name] Over the next 30 days, patient will demonstrate improved medication adherence as evidenced by verbalized understanding of prescribed medication regimen, assistance available, and patient report of adherence  Interventions: . Receive voicemail messages from Ms. Constable requesting a call back. . Follow up with patient, who reports having trouble obtaining BP monitor from [facility] . Counsel patient again about using health plan OTC benefit to obtain upper arm BP monitor from [employer] ? Patient reports daughter-in-law currently has card, but obtains card from her during this call. ? Patient confirms that she will use card to obtain new upper arm monitor at [name] ? Advise patient to follow up with [place] for further questions about using card . Let patient know that Care Guide will also be following up with her. . Will collaborate with Care Guide  Patient Self Care Activities:  . Patient takes medications as directed with aid of adherence tools. o Using pill packaging from [facility] . Calls pharmacy for medication refills . Patient to attend scheduled medical appointments . Calls provider office for new concerns or questions  . Patient to check blood sugars and keep log . Patient to check blood pressure daily and keep log  Please see past updates  related to this goal by clicking on the "Past Updates" button in the selected goal          Patient verbalizes understanding of instructions provided today.   Telephone follow up appointment with care management team member scheduled for: 4/28  [name], PharmD, [facility] [phone]

## 2020-01-08 NOTE — Progress Notes (Signed)
Postoperative Visit   History of Present Illness   Kendra [name] is a 74 y.o. female who presents for postoperative follow-up for: left CEA 12/30/19 by Dr. Donzetta Matters for asymptomatic stenosis.  She is complaining of firmness and swelling in the area of the left neck incision.  Patient states she had some trouble swallowing in the first day or 2 after surgery however this has resolved.  She denies any trouble with coughing or shortness of breath.  She denies any drainage from the incision.  She believes the swelling is improving however wanted it to be evaluated.  She denies any strokelike symptoms including slurring speech, changes in vision, or one-sided weakness.  Surgical history significant for right CEA on 10/21/2019 by Dr. Donzetta Matters due to symptomatic stenosis with CVA.  She continues to take aspirin, Plavix, and statin daily.  Current Outpatient Medications  Medication Sig Dispense Refill  . acetaminophen (TYLENOL) 500 MG tablet Take 2 tablets (1,000 mg total) by mouth every 8 (eight) hours as needed for mild pain or moderate pain. 270 tablet 1  . albuterol (VENTOLIN HFA) 108 (90 Base) MCG/ACT inhaler INHALE 1-2 PUFFS EVERY 6 HOURS AS NEEDEDSHORTNESS OF BREATH AND WHEEZING 18 g 2  . aspirin 325 MG tablet Take 325 mg by mouth daily.    [name] atorvastatin (LIPITOR) 40 MG tablet Take 1 tablet (40 mg total) by mouth daily. 90 tablet 0  . Blood Glucose Monitoring Suppl (ONETOUCH VERIO) w/Device KIT 1 kit by Does not apply route 2 (two) times daily. 1 kit 0  . buPROPion (WELLBUTRIN XL) 150 MG 24 hr tablet Take 1 tablet (150 mg total) by mouth daily. 90 tablet 0  . Cholecalciferol (VITAMIN D3) 50 MCG (2000 UT) capsule Take 1 capsule (2,000 Units total) by mouth daily. 90 capsule 1  . clopidogrel (PLAVIX) 75 MG tablet Take 1 tablet (75 mg total) by mouth daily. (Patient taking differently: Take 75 mg by mouth daily in the afternoon. ) 90 tablet 0  . Fluticasone-Umeclidin-Vilant (TRELEGY ELLIPTA) 100-62.5-25  MCG/INH AEPB Inhale 1 puff into the lungs daily. 60 each 3  . glucose blood (ONETOUCH VERIO) test strip Use as instructed 200 each 4  . HYDROcodone-acetaminophen (NORCO/VICODIN) 5-325 MG tablet Take 1 tablet by mouth every 6 (six) hours as needed for moderate pain. 10 tablet 0  . Lancet Devices (ONE TOUCH DELICA LANCING DEV) MISC 1 Device by Does not apply route 2 (two) times daily. 200 each 4  . meloxicam (MOBIC) 15 MG tablet TAKE 1 TABLET BY MOUTH ONCE DAILY AS NEEDED 90 tablet 0  . metFORMIN (GLUCOPHAGE-XR) 500 MG 24 hr tablet TAKE 1 TABLET BY MOUTH TWICE DAILY BEFORE A MEAL 60 tablet 3  . nicotine polacrilex (COMMIT) 4 MG lozenge Take 4 mg by mouth as needed for smoking cessation.    [name] Lancets 16X MISC 1 Device by Does not apply route 2 (two) times daily. 200 each 4  . oxyCODONE-acetaminophen (PERCOCET/ROXICET) 5-325 MG tablet Take 1 tablet by mouth every 6 (six) hours as needed for moderate pain. 10 tablet 0  . oxyCODONE-acetaminophen (PERCOCET/ROXICET) 5-325 MG tablet Take 1 tablet by mouth every 4 (four) hours as needed for severe pain. 30 tablet 0   No current facility-administered medications for this visit.    For VQI Use Only   PRE-ADM LIVING: Home  AMB STATUS: Ambulatory   Physical Examination   Vitals:   01/08/20 1345  BP: 118/70  Pulse: 90  Resp: 16  Temp: 98.2 F (36.8 C)  SpO2: 100%  Weight: 119 lb 6.4 oz (54.2 kg)  Height: _0  (1.6 m)    left Neck: Incision remains intact however there is firmness consistent with hematoma  Neuro: CN 2-12 are intact   Medical Decision Making   Kendra [name] is a 74 y.o. female who presents s/p left CEA.   Left neck hematoma however this seems to be improving based on patient's history  Dr. Oneida Alar also evaluated the patient and agrees this does not warrant evacuation in an operating room  Patient will call/return to office if hematoma worsens.  Otherwise we will see the patient back in 6 months  with a carotid duplex per protocol  Dagoberto Ligas PA-C [facility] Office: [phone]  Clinic MD: Oneida Alar

## 2020-01-08 NOTE — Chronic Care Management (AMB) (Signed)
Chronic Care Management   Follow Up Note   01/08/2020 Name: EURA [name] MRN: [id number] DOB: 1945/10/21  Referred by: No primary care provider on file. Reason for referral : Chronic Care Management (Patient Phone Call)   Kendra [name] is a 74 y.o. year old female who is a primary care patient of No primary care provider on file.. The CCM team was consulted for assistance with chronic disease management and care coordination needs. Kendra [name] has a medical history which includes but is not limited to recent CVA (11/2018), Type 2 Diabetes, hypertension, hyperlipidemia and COPD.    I reached out to [name] by phone today.   Review of patient status, including review of consultants reports, relevant laboratory and other test results, and collaboration with appropriate care team members and the patient's provider was performed as part of comprehensive patient evaluation and provision of chronic care management services.     Outpatient Encounter Medications as of 01/08/2020  Medication Sig  . acetaminophen (TYLENOL) 500 MG tablet Take 2 tablets (1,000 mg total) by mouth every 8 (eight) hours as needed for mild pain or moderate pain.  [name] albuterol (VENTOLIN HFA) 108 (90 Base) MCG/ACT inhaler INHALE 1-2 PUFFS EVERY 6 HOURS AS NEEDEDSHORTNESS OF BREATH AND WHEEZING  . aspirin 325 MG tablet Take 325 mg by mouth daily.  [name] atorvastatin (LIPITOR) 40 MG tablet Take 1 tablet (40 mg total) by mouth daily.  . Blood Glucose Monitoring Suppl (ONETOUCH VERIO) w/Device KIT 1 kit by Does not apply route 2 (two) times daily.  [name] buPROPion (WELLBUTRIN XL) 150 MG 24 hr tablet Take 1 tablet (150 mg total) by mouth daily.  . Cholecalciferol (VITAMIN D3) 50 MCG (2000 UT) capsule Take 1 capsule (2,000 Units total) by mouth daily.  . clopidogrel (PLAVIX) 75 MG tablet Take 1 tablet (75 mg total) by mouth daily. (Patient taking differently: Take 75 mg by mouth daily in the afternoon. )  .  Fluticasone-Umeclidin-Vilant (TRELEGY ELLIPTA) 100-62.5-25 MCG/INH AEPB Inhale 1 puff into the lungs daily.  [name] glucose blood (ONETOUCH VERIO) test strip Use as instructed  . HYDROcodone-acetaminophen (NORCO/VICODIN) 5-325 MG tablet Take 1 tablet by mouth every 6 (six) hours as needed for moderate pain.  [name] Devices (ONE TOUCH DELICA LANCING DEV) MISC 1 Device by Does not apply route 2 (two) times daily.  . meloxicam (MOBIC) 15 MG tablet TAKE 1 TABLET BY MOUTH ONCE DAILY AS NEEDED (Patient not taking: Reported on 11/28/2019)  . metFORMIN (GLUCOPHAGE-XR) 500 MG 24 hr tablet TAKE 1 TABLET BY MOUTH TWICE DAILY BEFORE A MEAL  . nicotine polacrilex (COMMIT) 4 MG lozenge Take 4 mg by mouth as needed for smoking cessation.  [name] Lancets 75O MISC 1 Device by Does not apply route 2 (two) times daily.  [name] oxyCODONE-acetaminophen (PERCOCET/ROXICET) 5-325 MG tablet Take 1 tablet by mouth every 6 (six) hours as needed for moderate pain.  [name] oxyCODONE-acetaminophen (PERCOCET/ROXICET) 5-325 MG tablet Take 1 tablet by mouth every 4 (four) hours as needed for severe pain.   No facility-administered encounter medications on file as of 01/08/2020.    Goals Addressed            This Visit's Progress   . PharmD-Medication Adherence       Current Barriers:  . Financial Barriers . Knowledge deficits related to coordination of her own care . Limited social support   Pharmacist Clinical Goal(s):   [name] Over the next 30 days, patient will demonstrate  improved medication adherence as evidenced by verbalized understanding of prescribed medication regimen, assistance available, and patient report of adherence  Interventions: . Receive voicemail messages from Ms. Basic requesting a call back. . Follow up with patient, who reports having trouble obtaining BP monitor from [facility] . Counsel patient again about using health plan OTC benefit to obtain upper arm BP monitor from [employer] ? Patient  reports daughter-in-law currently has card, but obtains card from her during this call. ? Patient confirms that she will use card to obtain new upper arm monitor at [name] ? Advise patient to follow up with [place] for further questions about using card . Let patient know that Care Guide will also be following up with her. . Will collaborate with Care Guide  Patient Self Care Activities:  . Patient takes medications as directed with aid of adherence tools. o Using pill packaging from [facility] . Calls pharmacy for medication refills . Patient to attend scheduled medical appointments . Calls provider office for new concerns or questions  . Patient to check blood sugars and keep log . Patient to check blood pressure daily and keep log  Please see past updates related to this goal by clicking on the "Past Updates" button in the selected goal          Plan  Telephone follow up appointment with care management team member scheduled for: 4/28  [name], PharmD, [facility] [phone]

## 2020-01-09 ENCOUNTER — Telehealth: Payer: Self-pay | Admitting: Family Medicine

## 2020-01-09 NOTE — Telephone Encounter (Signed)
Email to Pt's Son  From: Jill Alexanders [facility])  Sent: Friday, January 09, 2020 10:22 AM To: '[email or web address]> Subject: SECURE: Community Resource Referral follow-up   Good Afternoon Mr. Marquette Old, Thank you for speaking with me today in regards to [employer] for your mother. Ms. Sandor. Please see attached [employer] for Over the [employer]. [place] can purchase through her program. Also, you should have received an email from [employer] with coupons for you to use during your next trip to [place], you can have the cashier scan it on your phone at check out. I will notify Ms. Bonito if I am able to obtain any other samples for her.  Please let me know if you need anything further,   [place] . [email or web address]  [phone]

## 2020-01-09 NOTE — Telephone Encounter (Signed)
Email to [employer]  From: Jill Alexanders [facility])  Sent: Friday, January 09, 2020 10:01 AM To: Doristine [email or web address]> Cc: [email or web address], [email or web address] Subject: SECURE: Referral for Glucerna Samples - [place]  Good Morning Haylee,  Could you please ship a box of Glucerna samples and coupons to [facility], a pt of ours Kendra [name] is struggling and could use some assistance. She said her preference was the strawberry flavor if available.  [facility] c/o [place], [place] [id number] [place], [place] [address] [phone]  MRN:  [id number]  Many thanks,   [place] . [email or web address]  [phone]

## 2020-01-09 NOTE — Telephone Encounter (Signed)
  Community Resource Referral   [place] 01/09/2020  Name: KAALIYAH ADAN   MRN: [id number]   DOB: 09-04-1946   AGE: 74 y.o.   GENDER: female   PCP No primary care provider on file.Hulen Skains pt regarding Community Resource Referral for Blood Pressure Monitor. Pt stated that her son will take her to [employer] to pick up BP monitor on Monday or Tuesday when his car is out of the shop. She said she called the # on the back of her [place] card and she has $331 on her re-loadable card. Ms. Keats said that she also will be purchasing Glucerna drinks. I told her I would see if I could have some Glucerna samples and coupons made available to her and will send referral to [employer].  Called pt's son [name] and he gave me his email address and signed him up to receive the emails with coupons for Glucerna so that he could have the coupons on his phone when they checked out at [facility] on Mon-Tues next week.  Will also email Regino Ramirez from [name] Ms. Jain said she didn't have a copy.  Closing referral pending any other needs of patient.    [place] . [email or web address]  [phone]

## 2020-01-13 ENCOUNTER — Other Ambulatory Visit: Payer: Self-pay | Admitting: *Deleted

## 2020-01-13 DIAGNOSIS — I6523 Occlusion and stenosis of bilateral carotid arteries: Secondary | ICD-10-CM

## 2020-01-14 ENCOUNTER — Ambulatory Visit: Payer: Self-pay | Admitting: Pharmacist

## 2020-01-14 DIAGNOSIS — E118 Type 2 diabetes mellitus with unspecified complications: Secondary | ICD-10-CM | POA: Diagnosis not present

## 2020-01-14 DIAGNOSIS — F324 Major depressive disorder, single episode, in partial remission: Secondary | ICD-10-CM | POA: Diagnosis not present

## 2020-01-14 DIAGNOSIS — J449 Chronic obstructive pulmonary disease, unspecified: Secondary | ICD-10-CM | POA: Diagnosis not present

## 2020-01-14 DIAGNOSIS — I10 Essential (primary) hypertension: Secondary | ICD-10-CM

## 2020-01-14 DIAGNOSIS — F17200 Nicotine dependence, unspecified, uncomplicated: Secondary | ICD-10-CM

## 2020-01-14 DIAGNOSIS — Z794 Long term (current) use of insulin: Secondary | ICD-10-CM

## 2020-01-14 NOTE — Chronic Care Management (AMB) (Addendum)
Chronic Care Management   Follow Up Note   01/14/2020 Name: Kendra [name] MRN: [id number] DOB: 12-Feb-1946  Referred by: Kendra Bangs, FNP Reason for referral : Chronic Care Management (Patient Phone Call)   Kendra [name] is a 74 y.o. year old female who is a primary care patient of Kendra [name], [place]. The CCM team was consulted for assistance with chronic disease management and care coordination needs.  Kendra [name] has a medical history which includes but is not limited to CVA, Type 2 Diabetes, hypertension, hyperlipidemia and COPD.    I reached out to [name] by phone today.   Patient was recently discharged from hospital and all medications have been reviewed.  Review of patient status, including review of consultants reports, relevant laboratory and other test results, and collaboration with appropriate care team members and the patient's provider was performed as part of comprehensive patient evaluation and provision of chronic care management services.    SDOH (Social Determinants of Health) screening performed today: Tobacco Use. See Care Plan for related entries.   Outpatient Encounter Medications as of 01/14/2020  Medication Sig  . albuterol (VENTOLIN HFA) 108 (90 Base) MCG/ACT inhaler INHALE 1-2 PUFFS EVERY 6 HOURS AS NEEDEDSHORTNESS OF BREATH AND WHEEZING  . aspirin 325 MG tablet Take 325 mg by mouth daily.  [name] atorvastatin (LIPITOR) 40 MG tablet Take 1 tablet (40 mg total) by mouth daily.  [name] buPROPion (WELLBUTRIN XL) 150 MG 24 hr tablet Take 1 tablet (150 mg total) by mouth daily.  . Cholecalciferol (VITAMIN D3) 50 MCG (2000 UT) capsule Take 1 capsule (2,000 Units total) by mouth daily.  . clopidogrel (PLAVIX) 75 MG tablet Take 1 tablet (75 mg total) by mouth daily. (Patient taking differently: Take 75 mg by mouth daily in the afternoon. )  . Fluticasone-Umeclidin-Vilant (TRELEGY ELLIPTA) 100-62.5-25 MCG/INH AEPB Inhale 1 puff into the lungs daily.  .  metFORMIN (GLUCOPHAGE-XR) 500 MG 24 hr tablet TAKE 1 TABLET BY MOUTH TWICE DAILY BEFORE A MEAL  . nicotine polacrilex (COMMIT) 4 MG lozenge Take 4 mg by mouth as needed for smoking cessation.  [name] acetaminophen (TYLENOL) 500 MG tablet Take 2 tablets (1,000 mg total) by mouth every 8 (eight) hours as needed for mild pain or moderate pain. (Patient not taking: Reported on 01/14/2020)  . Blood Glucose Monitoring Suppl (ONETOUCH VERIO) w/Device KIT 1 kit by Does not apply route 2 (two) times daily.  [name] glucose blood (ONETOUCH VERIO) test strip Use as instructed  . Lancet Devices (ONE TOUCH DELICA LANCING DEV) MISC 1 Device by Does not apply route 2 (two) times daily.  . meloxicam (MOBIC) 15 MG tablet TAKE 1 TABLET BY MOUTH ONCE DAILY AS NEEDED (Patient not taking: Reported on 01/14/2020)  . OneTouch Delica Lancets 46K MISC 1 Device by Does not apply route 2 (two) times daily.  . [DISCONTINUED] HYDROcodone-acetaminophen (NORCO/VICODIN) 5-325 MG tablet Take 1 tablet by mouth every 6 (six) hours as needed for moderate pain. (Patient not taking: Reported on 01/14/2020)  . [DISCONTINUED] oxyCODONE-acetaminophen (PERCOCET/ROXICET) 5-325 MG tablet Take 1 tablet by mouth every 6 (six) hours as needed for moderate pain. (Patient not taking: Reported on 01/14/2020)  . [DISCONTINUED] oxyCODONE-acetaminophen (PERCOCET/ROXICET) 5-325 MG tablet Take 1 tablet by mouth every 4 (four) hours as needed for severe pain. (Patient not taking: Reported on 01/14/2020)   No facility-administered encounter medications on file as of 01/14/2020.    Goals Addressed  This Visit's Progress   . PharmD-Medication Adherence       Current Barriers:  . Financial Barriers . Knowledge deficits related to coordination of her own care . Limited social support   Pharmacist Clinical Goal(s):   [name] Over the next 30 days, patient will demonstrate improved medication adherence as evidenced by verbalized understanding of prescribed  medication regimen, assistance available, and patient report of adherence  Interventions: . Perform chart review ? Patient admitted at [facility] 4/13-4/14 as planned for left carotid endarterectomy procedure ? Note patient seen for postoperative follow up with [facility] on 4/22 ? Patient noted to have left neck hematoma. Patient advised to call/return to office if hematoma worsens.   . Follow up with patient regarding instruction from Vascular and Vein Specialist.  ? [name] reports swelling on neck seems to be improving.  ? Confirms will call to follow up with Vascular and Vein Specialist for any worsening, confirms having this phone number. . Comprehensive medication review performed; medication list updated in electronic medical record ? Reports has misplaced bottles of oxycodone, ordered following left carotid endarterectomy procedure. ? From chart review note hydrocodone-acetaminophen Rx ordered on 4/16 as patient contacted [facility] about loss of oxycodone Rx. ? Reports no longer has either medication, no longer taking . Counsel on importance of medication adherence ? Confirms continues to take medications as directed from pill packs as received from [facility] ? Confirms using maintenance inhaler (Trelegy) daily and rescue (albuterol) as needed as directed. Confirms rinsing out mouth after each use of Trelegy. Kendra [name] on continued importance of blood sugar control  ? Reports taking metformin ER 500 mg twice daily  ? Confirms received new One Touch Verio meter and has resumed checking CBGs and keeping log ? Reports last checked on 4/25 before breakfast: 110  . Counsel patient on continued importance of blood pressure monitoring and control ? Note patient remains off of antihypertensive medication  . Counsel patient again regarding importance of obtaining new upper arm BP monitor to resume monitoring ? Confirms has  health benefit OTC card and verablizes understanding of how to obtain monitor from [place].  ? Reports waiting on transportation to take her to [place]. [name] Counsel on importance of smoking cessation o Reports continues smoking a total of: 1 cigarette/day   States "I take 2 pulls and put it out" a couple of times during the day when she is stressed. o Using nicotine lozenges as needed as directed on package as cessation aid o Discussed strategies to help patient to manage her stress/manage craving  Patient continues to "keep busy" around house and walk to keep herself distracted when having cravings . Coordination of care: Patient confirms has transportation for upcoming PCP appointment on 5/3.  ? Remind patient to bring home blood sugar and blood pressure logs with her to visit.  Patient Self Care Activities:  . Patient takes medications as directed with aid of adherence tools. o Using pill packaging from [facility] . Calls pharmacy for medication refills . Patient to attend scheduled medical appointments . Calls provider office for new concerns or questions  . Patient to check blood sugars and keep log . Patient to check blood pressure daily and keep log  Please see past updates related to this goal by clicking on the "Past Updates" button in the selected goal          Plan  Telephone follow up appointment with care  management team member scheduled for: 5/19 at 2 pm  [name], PharmD, [place] [phone]

## 2020-01-14 NOTE — Patient Instructions (Signed)
Thank you allowing the Chronic Care Management Team to be a part of your care! It was a pleasure speaking with you today!     CCM (Chronic Care Management) Team    [name] RN, MSN, CCM Nurse Care Coordinator  [phone]   [name] PharmD  Clinical Pharmacist  [phone]   [name] LCSW Clinical Social Worker [phone]  Visit Information  Goals Addressed            This Visit's Progress   . PharmD-Medication Adherence       Current Barriers:  . Financial Barriers . Knowledge deficits related to coordination of her own care . Limited social support   Pharmacist Clinical Goal(s):   [name] Over the next 30 days, patient will demonstrate improved medication adherence as evidenced by verbalized understanding of prescribed medication regimen, assistance available, and patient report of adherence  Interventions: . Perform chart review ? Patient admitted at [facility] 4/13-4/14 as planned for left carotid endarterectomy procedure ? Note patient seen for postoperative follow up with [facility] Vascular and Vein Specialist on 4/22 ? Patient noted to have left neck hematoma. Patient advised to call/return to office if hematoma worsens.   . Follow up with patient regarding instruction from Vascular and Vein Specialist.  ? Ms. Croucher reports swelling on neck seems to be improving.  ? Confirms will call to follow up with Vascular and Vein Specialist for any worsening, confirms having this phone number. . Comprehensive medication review performed; medication list updated in electronic medical record ? Reports has misplaced bottles of oxycodone, ordered following left carotid endarterectomy procedure. ? From chart review note hydrocodone-acetaminophen Rx ordered on 4/16 as patient contacted [facility] about loss of oxycodone Rx. ? Reports no longer has either medication, no longer taking . Counsel on importance of medication  adherence ? Confirms continues to take medications as directed from pill packs as received from [facility] ? Confirms using maintenance inhaler (Trelegy) daily and rescue (albuterol) as needed as directed. Confirms rinsing out mouth after each use of Trelegy. [name] on continued importance of blood sugar control  ? Reports taking metformin ER 500 mg twice daily  ? Confirms received new One Touch Verio meter and has resumed checking CBGs and keeping log ? Reports last checked on 4/25 before breakfast: 110  . Counsel patient on continued importance of blood pressure monitoring and control ? Note patient remains off of antihypertensive medication  . Counsel patient again regarding importance of obtaining new upper arm BP monitor to resume monitoring ? Confirms has health benefit OTC card and verablizes understanding of how to obtain monitor from [place].  ? Reports waiting on transportation to take her to [place]. [name] Counsel on importance of smoking cessation o Reports continues smoking a total of: 1 cigarette/day   States "I take 2 pulls and put it out" a couple of times during the day when she is stressed. o Using nicotine lozenges as needed as directed on package as cessation aid o Discussed strategies to help patient to manage her stress/manage craving  Patient continues to "keep busy" around house and walk to keep herself distracted when having cravings . Coordination of care: Patient confirms has transportation for upcoming PCP appointment on 5/3.  ? Remind patient to bring home blood sugar and blood pressure logs with her to visit.  Patient Self Care Activities:  . Patient takes medications as directed with aid of adherence tools. o Using pill packaging from  [facility] . Calls pharmacy for medication refills . Patient to attend scheduled medical appointments . Calls provider office for new concerns or questions  . Patient to check blood sugars and keep log . Patient to check  blood pressure daily and keep log  Please see past updates related to this goal by clicking on the "Past Updates" button in the selected goal          Patient verbalizes understanding of instructions provided today.   Telephone follow up appointment with care management team member scheduled for: 5/19 at 2 pm  [name], PharmD, [place] [phone]

## 2020-01-15 ENCOUNTER — Emergency Department: Payer: Medicare Other

## 2020-01-15 ENCOUNTER — Encounter: Payer: Self-pay | Admitting: Emergency Medicine

## 2020-01-15 ENCOUNTER — Other Ambulatory Visit: Payer: Self-pay

## 2020-01-15 ENCOUNTER — Emergency Department
Admission: EM | Admit: 2020-01-15 | Discharge: 2020-01-15 | Disposition: A | Discharge status: Home / Self Care | Payer: Medicare Other | Attending: Emergency Medicine | Admitting: Emergency Medicine

## 2020-01-15 DIAGNOSIS — J449 Chronic obstructive pulmonary disease, unspecified: Secondary | ICD-10-CM | POA: Diagnosis not present

## 2020-01-15 DIAGNOSIS — F17210 Nicotine dependence, cigarettes, uncomplicated: Secondary | ICD-10-CM | POA: Insufficient documentation

## 2020-01-15 DIAGNOSIS — S01312A Laceration without foreign body of left ear, initial encounter: Secondary | ICD-10-CM | POA: Insufficient documentation

## 2020-01-15 DIAGNOSIS — W07XXXA Fall from chair, initial encounter: Secondary | ICD-10-CM | POA: Diagnosis not present

## 2020-01-15 DIAGNOSIS — Z7982 Long term (current) use of aspirin: Secondary | ICD-10-CM | POA: Diagnosis not present

## 2020-01-15 DIAGNOSIS — Z7984 Long term (current) use of oral hypoglycemic drugs: Secondary | ICD-10-CM | POA: Insufficient documentation

## 2020-01-15 DIAGNOSIS — I10 Essential (primary) hypertension: Secondary | ICD-10-CM | POA: Insufficient documentation

## 2020-01-15 DIAGNOSIS — Z8673 Personal history of transient ischemic attack (TIA), and cerebral infarction without residual deficits: Secondary | ICD-10-CM | POA: Diagnosis not present

## 2020-01-15 DIAGNOSIS — Y998 Other external cause status: Secondary | ICD-10-CM | POA: Insufficient documentation

## 2020-01-15 DIAGNOSIS — Y9389 Activity, other specified: Secondary | ICD-10-CM | POA: Insufficient documentation

## 2020-01-15 DIAGNOSIS — Z79899 Other long term (current) drug therapy: Secondary | ICD-10-CM | POA: Insufficient documentation

## 2020-01-15 DIAGNOSIS — E1140 Type 2 diabetes mellitus with diabetic neuropathy, unspecified: Secondary | ICD-10-CM | POA: Insufficient documentation

## 2020-01-15 DIAGNOSIS — S0990XA Unspecified injury of head, initial encounter: Secondary | ICD-10-CM

## 2020-01-15 DIAGNOSIS — Y92018 Other place in single-family (private) house as the place of occurrence of the external cause: Secondary | ICD-10-CM | POA: Diagnosis not present

## 2020-01-15 LAB — BASIC METABOLIC PANEL
Anion gap: 7 (ref 5–15)
BUN: 19 mg/dL (ref 8–23)
CO2: 25 mmol/L (ref 22–32)
Calcium: 9.5 mg/dL (ref 8.9–10.3)
Chloride: 105 mmol/L (ref 98–111)
Creatinine, Ser: 0.8 mg/dL (ref 0.44–1.00)
GFR calc Af Amer: >60 mL/min (ref >60)
GFR calc non Af Amer: >60 mL/min (ref >60)
Glucose, Bld: 108 mg/dL — ABNORMAL HIGH (ref 70–99)
Potassium: 3.9 mmol/L (ref 3.5–5.1)
Sodium: 137 mmol/L (ref 135–145)

## 2020-01-15 LAB — CBC
HCT: 31.5 % — ABNORMAL LOW (ref 36.0–46.0)
Hemoglobin: 10.7 g/dL — ABNORMAL LOW (ref 12.0–15.0)
MCH: 31.5 pg (ref 26.0–34.0)
MCHC: 34 g/dL (ref 30.0–36.0)
MCV: 92.6 fL (ref 80.0–100.0)
Platelets: 278 10*3/uL (ref 150–400)
RBC: 3.4 MIL/uL — ABNORMAL LOW (ref 3.87–5.11)
RDW: 13.4 % (ref 11.5–15.5)
WBC: 6.1 10*3/uL (ref 4.0–10.5)
nRBC: 0 % (ref 0.0–0.2)

## 2020-01-15 MED ORDER — LIDOCAINE HCL (PF) 1 % IJ SOLN
5.0000 mL | Freq: Once | INTRAMUSCULAR | Status: AC
  Administered 2020-01-15: 5 mL
  Filled 2020-01-15: qty 5

## 2020-01-15 MED ORDER — AMOXICILLIN-POT CLAVULANATE 875-125 MG PO TABS: 1.0000 | ORAL_TABLET | Freq: Two times a day (BID) | ORAL | 0 refills | Status: AC

## 2020-01-15 NOTE — ED Notes (Signed)
See triage note  Presents s/p fall  States  She was on porch and became hot and fell  Laceration noted to left ear

## 2020-01-15 NOTE — ED Triage Notes (Signed)
Patient to ED via [facility]. Reports she was sitting on her porch and felt sick so she leaned over and fell, hitting her head. Laceration noted to left ear. Denies LOC. Patient is on plavix.

## 2020-01-15 NOTE — Discharge Instructions (Signed)
Please keep dressing clean and dry for 24 hours.  In 24 hours you can shower and remove dressing.  As long as there is no drainage you will not need to keep laceration site covered.  If there is continued drainage follow-up with the ER or primary care provider for recheck of the wound.  Follow-up with primary care provider and 7 days for suture removal.  Return to the ER for any increasing pain, headaches, nausea vomiting worsening symptoms or new changes in health.

## 2020-01-15 NOTE — ED Provider Notes (Signed)
[facility] EMERGENCY DEPARTMENT Provider Note   CSN: [id number] Arrival date & time: 01/15/20  1253     History Chief Complaint  Patient presents with  . Fall    Kendra [name] is a 74 y.o. female presents to the emergency department for evaluation of left ear laceration.  She has a laceration to the posterior aspect of the left ear where it attaches to the scalp at the superior most aspect of the ear scalp junction.  Injury occurred just prior to arrival as she was sitting in a chair and fell out of the chair onto her porch hitting her head against a car motor.  She denies any current pain or discomfort.  No headache, neck pain, numbness tingling radicular symptoms.  She denies any preceding dizziness, lightheadedness, chest pain or shortness of breath or palpitations.  Patient states she accidentally slipped and fell out of her chair.  She has been ambulatory.  She is on aspirin and Plavix  HPI     Past Medical History:  Diagnosis Date  . Asthma   . Back pain   . Cataract   . Cervical disc disorder   . COPD (chronic obstructive pulmonary disease) ([place])   . Depression   . Diabetes ([place])    type II  . Diabetic neuropathy ([place])   . Dyspnea    with exertion  . GERD (gastroesophageal reflux disease)   . History of kidney stones    per patient "can't remember the year"  . Hyperlipidemia   . Hypertension    per patient "take meds for it"  . Insomnia   . Neuropathy   . Osteoarthritis   . Osteopenia   . Pneumonia 2018   per patient  . Reflux   . Rheumatoid arthritis ([place])   . Stroke ([place]) 2015   and again 2017  - Weakness in left leg, staggers w/ walking, vision   . Tenosynovitis     Patient Active Problem List   Diagnosis Date Noted  . Carotid artery stenosis 12/30/2019  . 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 Weakness of left upper extremity 10/16/2019  . Left-sided weakness 10/16/2019  .  Hypertension associated with diabetes ([place]) 12/19/2018  . Chronic midline low back pain without sciatica 05/07/2018  . Radiculopathy of leg 04/01/2017  . Calcification of aorta (H[facility]) 09/27/2016  . Early satiety 07/26/2016  . CMC arthritis, thumb, degenerative 07/16/2015  . Weight loss 06/07/2015  . Tenosynovitis of wrist 06/07/2015  . Protein calorie malnutrition ([place]) 06/07/2015  . Diabetes mellitus type 2, controlled, with complications ([place]) 40/98/1191  . Cataract 03/30/2015  . Other and unspecified disc disorder of cervical region 03/30/2015  . Insomnia 03/30/2015  . Centrilobular emphysema ([place]) 03/30/2015  . Clinical depression 03/30/2015  . Hyperlipidemia associated with type 2 diabetes mellitus ([place]) 03/30/2015  . Gastro-esophageal reflux disease without esophagitis 03/30/2015  . Increased thickness of nail 03/30/2015  . History of cerebrovascular accident with residual deficit 03/30/2015  . Osteoarthritis 03/30/2015  . Osteopenia 03/30/2015  . Allergic rhinitis 03/30/2015  . B12 deficiency 03/30/2015  . Tobacco use disorder 03/30/2015  . Vitamin D deficiency 03/30/2015  . Mycotic toenails 08/12/2014  . Diabetic peripheral neuropathy associated with type 2 diabetes mellitus ([place]) 08/12/2014    Past Surgical History:  Procedure Laterality Date  . BREAST BIOPSY Right    Fatty Tumor  . ENDARTERECTOMY Right 10/21/2019   Procedure: ENDARTERECTOMY CAROTID RIGHT;  Surgeon: [name],  MD;  Location: [facility];  Service: Vascular;  Laterality: Right;  . ENDARTERECTOMY Left 12/30/2019   Procedure: LEFT CAROTID ENDARTERECTOMY;  Surgeon: [name], MD;  Location: [facility];  Service: Vascular;  Laterality: Left;  . NECK SURGERY    . OVARIAN CYST REMOVAL    . PATCH ANGIOPLASTY Right 10/21/2019   Procedure: Patch Angioplasty Using XenoSure Biologic Patch Right Carotid;  Surgeon: [name], MD;  Location: [facility];  Service: Vascular;  Laterality:  Right;  . PATCH ANGIOPLASTY Left 12/30/2019   Procedure: Patch Angioplasty Left Carotid;  Surgeon: [name], MD;  Location: [facility];  Service: Vascular;  Laterality: Left;     OB History   No obstetric history on file.     Family History  Problem Relation Age of Onset  . Stroke Father   . Depression Father   . Diabetes Mother   . Hyperlipidemia Mother   . Drug abuse Sister   . Heart murmur Son   . Drug abuse Sister   . Heart murmur Son   . Heart murmur Son     Social History   Tobacco Use  . Smoking status: Current Some Day Smoker    Types: Cigarettes  . Smokeless tobacco: Former Systems developer    Types: Snuff  . Tobacco comment: .5 of 1 cigarette a day  Substance Use Topics  . Alcohol use: No  . Drug use: No    Home Medications Prior to Admission medications   Medication Sig Start Date End Date Taking? Authorizing Provider  albuterol (VENTOLIN HFA) 108 (90 Base) MCG/ACT inhaler INHALE 1-2 PUFFS EVERY 6 HOURS AS NEEDEDSHORTNESS OF BREATH AND WHEEZING 12/18/19   Malfi, [name], FNP  amoxicillin-clavulanate (AUGMENTIN) 875-125 MG tablet Take 1 tablet by mouth every 12 (twelve) hours for 7 days. 01/15/20 01/22/20  Duanne Guess, PA-C  aspirin 325 MG tablet Take 325 mg by mouth daily.    [provider]  atorvastatin (LIPITOR) 40 MG tablet Take 1 tablet (40 mg total) by mouth daily. 10/24/19   Karamalegos, Devonne Doughty, DO  Blood Glucose Monitoring Suppl (ONETOUCH VERIO) w/Device KIT 1 kit by Does not apply route 2 (two) times daily. 01/02/20   Malfi, [name], FNP  buPROPion (WELLBUTRIN XL) 150 MG 24 hr tablet Take 1 tablet (150 mg total) by mouth daily. 10/24/19   Karamalegos, Devonne Doughty, DO  Cholecalciferol (VITAMIN D3) 50 MCG (2000 UT) capsule Take 1 capsule (2,000 Units total) by mouth daily. 12/20/18   [name], NP  clopidogrel (PLAVIX) 75 MG tablet Take 1 tablet (75 mg total) by mouth daily. Patient taking differently: Take 75 mg by mouth daily in  the afternoon.  11/20/19   Karamalegos, Devonne Doughty, DO  Fluticasone-Umeclidin-Vilant (TRELEGY ELLIPTA) 100-62.5-25 MCG/INH AEPB Inhale 1 puff into the lungs daily. 08/27/19   Karamalegos, Devonne Doughty, DO  glucose blood (ONETOUCH VERIO) test strip Use as instructed 01/02/20   Malfi, [name], FNP  Lancet Devices (ONE TOUCH DELICA LANCING DEV) MISC 1 Device by Does not apply route 2 (two) times daily. 01/02/20   Malfi, [name], FNP  metFORMIN (GLUCOPHAGE-XR) 500 MG 24 hr tablet TAKE 1 TABLET BY MOUTH TWICE DAILY BEFORE A MEAL 12/19/19   Karamalegos, Devonne Doughty, DO  nicotine polacrilex (COMMIT) 4 MG lozenge Take 4 mg by mouth as needed for smoking cessation.    [provider]  OneTouch Delica Lancets 10U MISC 1 Device by Does not apply route 2 (two) times  daily. 12/20/18   [name], NP    Allergies    Citalopram  Review of Systems   Review of Systems  Constitutional: Negative for chills and fever.  Eyes: Negative for photophobia.  Respiratory: Negative for shortness of breath.   Cardiovascular: Negative for chest pain.  Gastrointestinal: Negative for nausea and vomiting.  Musculoskeletal: Negative for arthralgias, back pain, gait problem, joint swelling, myalgias and neck pain.  Skin: Positive for wound.  Neurological: Negative for dizziness, weakness, light-headedness and headaches.    Physical Exam Updated Vital Signs BP 99/64 (BP Location: Left Arm)   Pulse 82   Temp 98.4 F (36.9 C) (Oral)   Resp 16   Ht '5\' 3"'$  (1.6 m)   Wt 54 kg   SpO2 98%   BMI 21.09 kg/m   Physical Exam Constitutional:      Appearance: Normal appearance. She is well-developed.  HENT:     Head: Normocephalic.     Comments: Patient's left posterior ear with a laceration, 3-1/2 cm in length along the superior aspect of the junction between the ear and the scalp.  No other lacerations or trauma to the ear.  Canal is normal. Eyes:     Conjunctiva/sclera: Conjunctivae normal.    Cardiovascular:     Rate and Rhythm: Normal rate.  Pulmonary:     Effort: Pulmonary effort is normal. No respiratory distress.  Musculoskeletal:        General: No swelling or tenderness. Normal range of motion.     Cervical back: Normal range of motion.     Comments: Normal neck range of motion with no cervical thoracic or lumbar spinous process tenderness.  Good range of motion of both hips with internal and external rotation.  Skin:    General: Skin is warm.     Findings: No rash.  Neurological:     Mental Status: She is alert and oriented to person, place, and time.  Psychiatric:        Behavior: Behavior normal.        Thought Content: Thought content normal.     ED Results / Procedures / Treatments   Labs (all labs ordered are listed, but only abnormal results are displayed) Labs Reviewed  BASIC METABOLIC PANEL - Abnormal; Notable for the following components:      Result Value   Glucose, Bld 108 (*)    All other components within normal limits  CBC - Abnormal; Notable for the following components:   RBC 3.40 (*)    Hemoglobin 10.7 (*)    HCT 31.5 (*)    All other components within normal limits  URINALYSIS, COMPLETE (UACMP) WITH MICROSCOPIC    EKG None  Radiology CT HEAD WO CONTRAST  Result Date: 01/15/2020 CLINICAL DATA:  Ataxia. Head trauma. Patient was sitting on her porch and felt so sick she leaned over and fell, hitting her head. Laceration of the LEFT ear. EXAM: CT HEAD WITHOUT CONTRAST TECHNIQUE: Contiguous axial images were obtained from the base of the skull through the vertex without intravenous contrast. COMPARISON:  Prior studies including 10/16/2019 FINDINGS: Brain: There is significant central and cortical atrophy. There is encephalomalacia in the RIGHT middle cerebral artery distribution. There is no intra or extra-axial fluid collection or mass lesion. The basilar cisterns and ventricles have a normal appearance. There is no CT evidence for acute  infarction or hemorrhage. Vascular: There is dense atherosclerotic calcification of the internal carotid arteries. No hyperdense vessels. Skull: Normal. Negative for fracture or  focal lesion. Sinuses/Orbits: No acute finding. Other: None IMPRESSION: 1. No evidence for acute intracranial abnormality. 2. Atrophy and chronic microvascular disease. 3. Stable RIGHT middle cerebral artery distribution encephalomalacia. Electronically Signed   By: [name] M.D.   On: 01/15/2020 13:45    Procedures .[name] KitchenLaceration Repair  Date/Time: 01/15/2020 6:14 PM Performed by: Duanne Guess, PA-C Authorized by: Duanne Guess, PA-C   Consent:    Consent obtained:  Verbal   Consent given by:  Patient Laceration details:    Location:  Ear   Ear location:  L ear   Length (cm):  4   Depth (mm):  2 Repair type:    Repair type:  Complex Pre-procedure details:    Preparation:  Patient was prepped and draped in usual sterile fashion Exploration:    Wound exploration: wound explored through full range of motion     Contaminated: no   Treatment:    Area cleansed with:  Betadine and saline   Amount of cleaning:  Extensive   Irrigation method:  Pressure wash   Visualized foreign bodies/material removed: no   Skin repair:    Repair method:  Sutures   Suture size:  5-0   Suture material:  Nylon   Suture technique:  Running locked   Number of sutures:  6 Approximation:    Approximation:  Close Post-procedure details:    Dressing:  Bulky dressing   Patient tolerance of procedure:  Tolerated well, no immediate complications   (including critical care time)  Medications Ordered in ED Medications  lidocaine (PF) (XYLOCAINE) 1 % injection 5 mL (has no administration in time range)    ED Course  I have reviewed the triage vital signs and the nursing notes.  Pertinent labs & imaging results that were available during my care of the patient were reviewed by me and considered in my medical decision  making (see chart for details).    MDM Rules/Calculators/A&P                      74 year old female with mechanical fall out of her chair hitting her head against a motor on the porch.  No headache, LOC, nausea or vomiting.  She is on Plavix.  CT of the head negative.  She suffered a laceration posterior to the left ear, this was thoroughly irrigated cleansed and repaired with a running nylon stitch.  Patient tolerated procedure well.  Bleeding controlled.  She is educated on wound care and will follow-up in 7 days for suture removal.  She is placed on prophylactic antibiotics.  Tetanus is up-to-date. Final Clinical Impression(s) / ED Diagnoses Final diagnoses:  Injury of head, initial encounter  Complex laceration of left ear, initial encounter    Rx / DC Orders ED Discharge Orders         Ordered    amoxicillin-clavulanate (AUGMENTIN) 875-125 MG tablet  Every 12 hours     01/15/20 1813           Renata Caprice 01/15/20 1817    Vanessa Mayetta, MD 01/15/20 1949

## 2020-01-18 ENCOUNTER — Other Ambulatory Visit: Payer: Self-pay | Admitting: Family Medicine

## 2020-01-18 DIAGNOSIS — IMO0002 Reserved for concepts with insufficient information to code with codable children: Secondary | ICD-10-CM

## 2020-01-18 DIAGNOSIS — F3341 Major depressive disorder, recurrent, in partial remission: Secondary | ICD-10-CM

## 2020-01-18 DIAGNOSIS — E785 Hyperlipidemia, unspecified: Secondary | ICD-10-CM

## 2020-01-18 DIAGNOSIS — E1165 Type 2 diabetes mellitus with hyperglycemia: Secondary | ICD-10-CM

## 2020-01-18 DIAGNOSIS — J432 Centrilobular emphysema: Secondary | ICD-10-CM

## 2020-01-18 NOTE — Telephone Encounter (Signed)
Requested Prescriptions  Pending Prescriptions Disp Refills  . buPROPion (WELLBUTRIN XL) 150 MG 24 hr tablet [Pharmacy Med Name: BUPROPION HCL ER (XL) 150 MG TAB] 90 tablet 0    Sig: TAKE 1 TABLET BY MOUTH ONCE DAILY     Psychiatry: Antidepressants - bupropion Failed - 01/18/2020  2:11 PM      Failed - Last BP in normal range    BP Readings from Last 1 Encounters:  01/15/20 135/90         Passed - Completed PHQ-2 or PHQ-9 in the last 360 days.      Passed - Valid encounter within last 6 months    Recent Outpatient Visits          3 months ago Essential hypertension   [place], DO   4 months ago Controlled type 2 diabetes mellitus with complication, with long-term current use of insulin [facility])   [place], DO   8 months ago Uncontrolled type 2 diabetes mellitus with insulin therapy [facility])   [facility] [name], NP   8 months ago Recurrent major depressive disorder, in partial remission [facility])   [facility] [name], NP   10 months ago Constipation, unspecified constipation type   [facility], Devonne Doughty, DO      Future Appointments            Tomorrow Malfi, [name], [facility], [place]           . atorvastatin (LIPITOR) 40 MG tablet [Pharmacy Med Name: ATORVASTATIN CALCIUM 40 MG TAB] 90 tablet 0    Sig: TAKE 1 TABLET BY MOUTH ONCE DAILY     Cardiovascular:  Antilipid - Statins Failed - 01/18/2020  2:11 PM      Failed - HDL in normal range and within 360 days    HDL Cholesterol  Date Value Ref Range Status  09/02/2014 37 (L) 40 - 60 mg/dL Final   HDL  Date Value Ref Range Status  10/17/2019 40 (L) >40 mg/dL Final  06/07/2015 56 >39 mg/dL Final    Comment:    According to ATP-III Guidelines, HDL-C >59 mg/dL is considered a negative risk factor for CHD.          Passed - Total  Cholesterol in normal range and within 360 days    Cholesterol, Total  Date Value Ref Range Status  06/07/2015 177 100 - 199 mg/dL Final   Cholesterol  Date Value Ref Range Status  10/17/2019 93 0 - 200 mg/dL Final  09/02/2014 141 0 - 200 mg/dL Final         Passed - LDL in normal range and within 360 days    Ldl Cholesterol, Calc  Date Value Ref Range Status  09/02/2014 73 0 - 100 mg/dL Final   LDL Cholesterol (Calc)  Date Value Ref Range Status  08/20/2019 43 mg/dL (calc) Final    Comment:    Reference range: <100 . Desirable range <100 mg/dL for primary prevention;   <70 mg/dL for patients with CHD or diabetic patients  with > or = 2 CHD risk factors. [name] LDL-C is now calculated using the Martin-Hopkins  calculation, which is a validated novel method providing  better accuracy than the Friedewald equation in the  estimation of LDL-C.  Cresenciano Genre et al. Annamaria Helling. [id number]): 2061-2068  (http://education.[employer].com/faq/FAQ164)    LDL Cholesterol  Date Value Ref Range Status  10/17/2019 37 0 - 99 mg/dL Final    Comment:           Total Cholesterol/HDL:CHD Risk Coronary Heart Disease Risk Table                     Men   Women  1/2 Average Risk   3.4   3.3  Average Risk       5.0   4.4  2 X Average Risk   9.6   7.1  3 X Average Risk  23.4   11.0        Use the calculated Patient Ratio above and the CHD Risk Table to determine the patient's CHD Risk.        ATP III CLASSIFICATION (LDL):  <100     mg/dL   Optimal  100-129  mg/dL   Near or Above                    Optimal  130-159  mg/dL   Borderline  160-189  mg/dL   High  >190     mg/dL   Very High Performed at [facility] [address] [place], [place] [address]          Passed - Triglycerides in normal range and within 360 days    Triglycerides  Date Value Ref Range Status  10/17/2019 79 <150 mg/dL Final  09/02/2014 155 0 - 200 mg/dL Final         Passed - Patient is not pregnant       Passed - Valid encounter within last 12 months    Recent Outpatient Visits          3 months ago Essential hypertension   [place], DO   4 months ago Controlled type 2 diabetes mellitus with complication, with long-term current use of insulin [facility])   [place], DO   8 months ago Uncontrolled type 2 diabetes mellitus with insulin therapy [facility])   [place] [name], NP   8 months ago Recurrent major depressive disorder, in partial remission [facility])   [name] [name], NP   10 months ago Constipation, unspecified constipation type   [facility], Devonne Doughty, DO      Future Appointments            Tomorrow Malfi, [name], [facility], [facility]

## 2020-01-18 NOTE — Telephone Encounter (Signed)
Requested Prescriptions  Pending Prescriptions Disp Refills  . TRELEGY ELLIPTA 100-62.5-25 MCG/INH AEPB [Pharmacy Med Name: TRELEGY ELLIPTA 100-62.5-25 MCG/INH] 60 each 3    Sig: INHALE 1 PUFF INTO THE LUNGS ONCE DAILY     Off-Protocol Failed - 01/18/2020  2:19 PM      Failed - Medication not assigned to a protocol, review manually.      Passed - Valid encounter within last 12 months    Recent Outpatient Visits          3 months ago Essential hypertension   [place], DO   4 months ago Controlled type 2 diabetes mellitus with complication, with long-term current use of insulin [facility])   [place], DO   8 months ago Uncontrolled type 2 diabetes mellitus with insulin therapy [facility])   [facility] [name], NP   8 months ago Recurrent major depressive disorder, in partial remission [facility])   [facility] [name], NP   10 months ago Constipation, unspecified constipation type   [name], Devonne Doughty, DO      Future Appointments            Tomorrow Malfi, [name], [facility], [facility]

## 2020-01-19 ENCOUNTER — Other Ambulatory Visit: Payer: Self-pay

## 2020-01-19 ENCOUNTER — Telehealth (INDEPENDENT_AMBULATORY_CARE_PROVIDER_SITE_OTHER): Payer: Medicare Other | Admitting: Family Medicine

## 2020-01-19 ENCOUNTER — Encounter: Payer: Self-pay | Admitting: Family Medicine

## 2020-01-19 DIAGNOSIS — E785 Hyperlipidemia, unspecified: Secondary | ICD-10-CM

## 2020-01-19 DIAGNOSIS — E1159 Type 2 diabetes mellitus with other circulatory complications: Secondary | ICD-10-CM

## 2020-01-19 DIAGNOSIS — F17210 Nicotine dependence, cigarettes, uncomplicated: Secondary | ICD-10-CM

## 2020-01-19 DIAGNOSIS — E118 Type 2 diabetes mellitus with unspecified complications: Secondary | ICD-10-CM

## 2020-01-19 DIAGNOSIS — H9202 Otalgia, left ear: Secondary | ICD-10-CM | POA: Diagnosis not present

## 2020-01-19 DIAGNOSIS — Z79899 Other long term (current) drug therapy: Secondary | ICD-10-CM

## 2020-01-19 DIAGNOSIS — I10 Essential (primary) hypertension: Secondary | ICD-10-CM | POA: Diagnosis not present

## 2020-01-19 DIAGNOSIS — I152 Hypertension secondary to endocrine disorders: Secondary | ICD-10-CM

## 2020-01-19 DIAGNOSIS — Z794 Long term (current) use of insulin: Secondary | ICD-10-CM

## 2020-01-19 DIAGNOSIS — E1169 Type 2 diabetes mellitus with other specified complication: Secondary | ICD-10-CM | POA: Diagnosis not present

## 2020-01-19 DIAGNOSIS — Z7982 Long term (current) use of aspirin: Secondary | ICD-10-CM

## 2020-01-19 DIAGNOSIS — Z87891 Personal history of nicotine dependence: Secondary | ICD-10-CM

## 2020-01-19 DIAGNOSIS — R110 Nausea: Secondary | ICD-10-CM

## 2020-01-19 MED ORDER — ONDANSETRON HCL 4 MG PO TABS: 8.0000 mg | ORAL_TABLET | Freq: Three times a day (TID) | ORAL | 0 refills | Status: DC | PRN

## 2020-01-19 MED ORDER — IBUPROFEN 600 MG PO TABS: 600.0000 mg | ORAL_TABLET | Freq: Three times a day (TID) | ORAL | 0 refills | Status: DC | PRN

## 2020-01-19 NOTE — Progress Notes (Signed)
Virtual Visit via Telephone  The purpose of this virtual visit is to provide medical care while limiting exposure to the novel coronavirus (COVID19) for both patient and office staff.  Consent was obtained for phone visit:  Yes.   Answered questions that patient had about telehealth interaction:  Yes.   I discussed the limitations, risks, security and privacy concerns of performing an evaluation and management service by telephone. I also discussed with the patient that there may be a patient responsible charge related to this service. The patient expressed understanding and agreed to proceed.  Patient is at home and is accessed via telephone Services are provided by Harlin Rain, FNP-C from [facility])  ---------------------------------------------------------------------- Chief Complaint  Patient presents with  . Diabetes    pt state her bs averaging 175mg . Pt had a injury to left ear from a recent fall. She was sitting in a chair and fell out of the chair onto her porch hitting her head against a car motor. She complains of pain in the left ear where they had to place sutures.   X 5 days  . Hypertension    S: Reviewed CMA documentation. I have called patient and gathered additional HPI as follows:  Ms. CGroesbeckpresents for virtual telemedicine visit for follow up on her fall where she had a left ear laceration on 01/15/2020, her diabetes and her hypertension.  Reports that her CBG AM readings are around 115.  She has been taking her blood pressure readings at home and reports are <130/80.  States she had received sutures in her left ear from a fall on 01/15/2020 when she hit her head on a car motor, and was told to have the sutures removed in 7 days.  Denies headaches, dizziness, visual changes, chest pain, SOB, numbness, tingling, weakness, abdominal pain, n/v/d.  Reports her pain has increased near her left ear and has been taking acetaminophen without  relief.  States has not taken ibuprofen due to nausea she gets with the medication.  Patient is currently home Denies any high risk travel to areas of current concern for COVID19. Denies any known or suspected exposure to person with or possibly with COVID19.   Past Medical History:  Diagnosis Date  . Asthma   . Back pain   . Cataract   . Cervical disc disorder   . COPD (chronic obstructive pulmonary disease) ([place]   . Depression   . Diabetes ([place]    type II  . Diabetic neuropathy ([place]   . Dyspnea    with exertion  . GERD (gastroesophageal reflux disease)   . History of kidney stones    per patient "can't remember the year"  . Hyperlipidemia   . Hypertension    per patient "take meds for it"  . Insomnia   . Neuropathy   . Osteoarthritis   . Osteopenia   . Pneumonia 2018   per patient  . Reflux   . Rheumatoid arthritis ([place]   . Stroke ([place] 2015   and again 2017  - Weakness in left leg, staggers w/ walking, vision   . Tenosynovitis    Social History   Tobacco Use  . Smoking status: Current Some Day Smoker    Types: Cigarettes  . Smokeless tobacco: Former [employer] developer   Types: Snuff  . Tobacco comment: .5 of 1 cigarette a day  Substance Use Topics  . Alcohol use: No  . Drug use: No    Current Outpatient  Medications:  .  albuterol (VENTOLIN HFA) 108 (90 Base) MCG/ACT inhaler, INHALE 1-2 PUFFS EVERY 6 HOURS AS NEEDEDSHORTNESS OF BREATH AND WHEEZING, Disp: 18 g, Rfl: 2 .  amoxicillin-clavulanate (AUGMENTIN) 875-125 MG tablet, Take 1 tablet by mouth every 12 (twelve) hours for 7 days., Disp: 14 tablet, Rfl: 0 .  aspirin 325 MG tablet, Take 325 mg by mouth daily., Disp: , Rfl:  .  atorvastatin (LIPITOR) 40 MG tablet, TAKE 1 TABLET BY MOUTH ONCE DAILY, Disp: 90 tablet, Rfl: 0 .  Blood Glucose Monitoring Suppl (ONETOUCH VERIO) w/Device KIT, 1 kit by Does not apply route 2 (two) times daily., Disp: 1 kit, Rfl: 0 .  buPROPion (WELLBUTRIN XL) 150 MG 24 hr tablet, TAKE 1  TABLET BY MOUTH ONCE DAILY, Disp: 90 tablet, Rfl: 0 .  Cholecalciferol (VITAMIN D3) 50 MCG (2000 UT) capsule, Take 1 capsule (2,000 Units total) by mouth daily., Disp: 90 capsule, Rfl: 1 .  clopidogrel (PLAVIX) 75 MG tablet, Take 1 tablet (75 mg total) by mouth daily. (Patient taking differently: Take 75 mg by mouth daily in the afternoon. ), Disp: 90 tablet, Rfl: 0 .  glucose blood (ONETOUCH VERIO) test strip, Use as instructed, Disp: 200 each, Rfl: 4 .  Lancet Devices (ONE TOUCH DELICA LANCING DEV) MISC, 1 Device by Does not apply route 2 (two) times daily., Disp: 200 each, Rfl: 4 .  metFORMIN (GLUCOPHAGE-XR) 500 MG 24 hr tablet, TAKE 1 TABLET BY MOUTH TWICE DAILY BEFORE A MEAL, Disp: 60 tablet, Rfl: 3 .  OneTouch Delica Lancets 97X MISC, 1 Device by Does not apply route 2 (two) times daily., Disp: 200 each, Rfl: 4 .  TRELEGY ELLIPTA 100-62.5-25 MCG/INH AEPB, INHALE 1 PUFF INTO THE LUNGS ONCE DAILY, Disp: 60 each, Rfl: 3 .  ibuprofen (ADVIL) 600 MG tablet, Take 1 tablet (600 mg total) by mouth every 8 (eight) hours as needed., Disp: 30 tablet, Rfl: 0 .  nicotine polacrilex (COMMIT) 4 MG lozenge, Take 4 mg by mouth as needed for smoking cessation., Disp: , Rfl:  .  ondansetron (ZOFRAN) 4 MG tablet, Take 2 tablets (8 mg total) by mouth every 8 (eight) hours as needed for nausea or vomiting., Disp: 20 tablet, Rfl: 0  Depression screen [name] 2/9 11/04/2019 09/22/2019 04/30/2019  Decreased Interest 0 3 3  Down, Depressed, Hopeless '1 3 2  '$ PHQ - 2 Score '1 6 5  '$ Altered sleeping 0 0 2  Tired, decreased energy 0 0 1  Change in appetite 1 0 0  Feeling bad or failure about yourself  0 0 0  Trouble concentrating 0 0 0  Moving slowly or fidgety/restless 0 0 1  Suicidal thoughts 0 0 0  PHQ-9 Score '2 6 9  '$ Difficult doing work/chores Not difficult at all Not difficult at all Not difficult at all  Some recent data might be hidden    GAD 7 : Generalized Anxiety Score 09/22/2019 04/30/2019 02/17/2019  Nervous,  Anxious, on Edge 2 0 (No Data)  Control/stop worrying 3 0 -  Worry too much - different things 3 2 -  Trouble relaxing 0 0 -  Restless 2 1 -  Easily annoyed or irritable 3 3 -  Afraid - awful might happen 1 0 -  Total GAD 7 Score 14 6 -  Anxiety Difficulty Not difficult at all Not difficult at all -    -------------------------------------------------------------------------- O: No physical exam performed due to remote telephone encounter.  Physical Exam: Patient remotely monitored without video.  Verbal communication appropriate.  Cognition normal.   Recent Results (from the past 2160 hour(s))  Glucose, capillary     Status: Abnormal   Collection Time: 10/21/19  9:44 AM  Result Value Ref Range   Glucose-Capillary 137 (H) 70 - 99 mg/dL   Comment 1 Notify RN    Comment 2 Document in Chart   Glucose, capillary     Status: Abnormal   Collection Time: 10/21/19  3:25 PM  Result Value Ref Range   Glucose-Capillary 175 (H) 70 - 99 mg/dL  Glucose, capillary     Status: Abnormal   Collection Time: 10/21/19  7:49 PM  Result Value Ref Range   Glucose-Capillary 164 (H) 70 - 99 mg/dL  Glucose, capillary     Status: Abnormal   Collection Time: 10/21/19 10:18 PM  Result Value Ref Range   Glucose-Capillary 154 (H) 70 - 99 mg/dL  Glucose, capillary     Status: Abnormal   Collection Time: 10/21/19 11:37 PM  Result Value Ref Range   Glucose-Capillary 128 (H) 70 - 99 mg/dL  Glucose, capillary     Status: Abnormal   Collection Time: 10/22/19  3:41 AM  Result Value Ref Range   Glucose-Capillary 144 (H) 70 - 99 mg/dL  CBC     Status: Abnormal   Collection Time: 10/22/19  4:51 AM  Result Value Ref Range   WBC 6.4 4.0 - 10.5 K/uL   RBC 3.43 (L) 3.87 - 5.11 MIL/uL   Hemoglobin 10.8 (L) 12.0 - 15.0 g/dL   HCT 31.9 (L) 36.0 - 46.0 %   MCV 93.0 80.0 - 100.0 fL   MCH 31.5 26.0 - 34.0 pg   MCHC 33.9 30.0 - 36.0 g/dL   RDW 12.7 11.5 - 15.5 %   Platelets 259 150 - 400 K/uL   nRBC 0.0 0.0 -  0.2 %    Comment: Performed at [facility], [address] [place], [place] [address]  Basic metabolic panel     Status: Abnormal   Collection Time: 10/22/19  4:51 AM  Result Value Ref Range   Sodium 139 135 - 145 mmol/L   Potassium 3.7 3.5 - 5.1 mmol/L   Chloride 108 98 - 111 mmol/L   CO2 23 22 - 32 mmol/L   Glucose, Bld 165 (H) 70 - 99 mg/dL   BUN 8 8 - 23 mg/dL   Creatinine, Ser 0.73 0.44 - 1.00 mg/dL   Calcium 9.0 8.9 - 10.3 mg/dL   GFR calc non Af Amer >60 >60 mL/min   GFR calc Af Amer >60 >60 mL/min   Anion gap 8 5 - 15    Comment: Performed at [facility] [address], [place], [place] [address]  Magnesium     Status: None   Collection Time: 10/22/19  4:51 AM  Result Value Ref Range   Magnesium 1.7 1.7 - 2.4 mg/dL    Comment: Performed at [facility] [address] [place], [place] [address]  Glucose, capillary     Status: Abnormal   Collection Time: 10/22/19  8:11 AM  Result Value Ref Range   Glucose-Capillary 153 (H) 70 - 99 mg/dL  Glucose, capillary     Status: Abnormal   Collection Time: 10/22/19 11:30 AM  Result Value Ref Range   Glucose-Capillary 186 (H) 70 - 99 mg/dL  Glucose, capillary     Status: Abnormal   Collection Time: 10/22/19  3:34 PM  Result Value Ref Range  Glucose-Capillary 141 (H) 70 - 99 mg/dL  Glucose, capillary     Status: Abnormal   Collection Time: 10/22/19  7:41 PM  Result Value Ref Range   Glucose-Capillary 141 (H) 70 - 99 mg/dL  Glucose, capillary     Status: None   Collection Time: 10/22/19 11:40 PM  Result Value Ref Range   Glucose-Capillary 85 70 - 99 mg/dL  Glucose, capillary     Status: None   Collection Time: 10/23/19  3:50 AM  Result Value Ref Range   Glucose-Capillary 75 70 - 99 mg/dL  Glucose, capillary     Status: None   Collection Time: 10/23/19 11:39 AM  Result Value Ref Range   Glucose-Capillary 97 70 - 99 mg/dL  SARS CORONAVIRUS 2 (TAT 6-24 HRS) Nasopharyngeal Nasopharyngeal Swab     Status:  None   Collection Time: 12/05/19  8:31 AM   Specimen: Nasopharyngeal Swab  Result Value Ref Range   SARS Coronavirus 2 NEGATIVE NEGATIVE    Comment: (NOTE) SARS-CoV-2 target nucleic acids are NOT DETECTED. The SARS-CoV-2 RNA is generally detectable in upper and lower respiratory specimens during the acute phase of infection. Negative results do not preclude SARS-CoV-2 infection, do not rule out co-infections with other pathogens, and should not be used as the sole basis for treatment or other patient management decisions. Negative results must be combined with clinical observations, patient history, and epidemiological information. The expected result is Negative. Fact Sheet for Patients: SugarRoll.be Fact Sheet for Healthcare Providers: https://www.woods-mathews.com/ This test is not yet approved or cleared by the [place] FDA and  has been authorized for detection and/or diagnosis of SARS-CoV-2 by FDA under an Emergency Use Authorization (EUA). This EUA will remain  in effect (meaning this test can be used) for the duration of the COVID-19 declaration under Section 56 4(b)(1) of the Act, 21 U.S.C. section 360bbb-3(b)(1), unless the authorization is terminated or revoked sooner. Performed at [facility], [place] [address], [place], [place] [address]   SARS CORONAVIRUS 2 (TAT 6-24 HRS) Nasopharyngeal Nasopharyngeal Swab     Status: None   Collection Time: 12/24/19 11:48 AM   Specimen: Nasopharyngeal Swab  Result Value Ref Range   SARS Coronavirus 2 NEGATIVE NEGATIVE    Comment: (NOTE) SARS-CoV-2 target nucleic acids are NOT DETECTED. The SARS-CoV-2 RNA is generally detectable in upper and lower respiratory specimens during the acute phase of infection. Negative results do not preclude SARS-CoV-2 infection, do not rule out co-infections with other pathogens, and should not be used as the sole basis for treatment or other patient  management decisions. Negative results must be combined with clinical observations, patient history, and epidemiological information. The expected result is Negative. Fact Sheet for Patients: SugarRoll.be Fact Sheet for Healthcare Providers: https://www.woods-mathews.com/ This test is not yet approved or cleared by the [place] FDA and  has been authorized for detection and/or diagnosis of SARS-CoV-2 by FDA under an Emergency Use Authorization (EUA). This EUA will remain  in effect (meaning this test can be used) for the duration of the COVID-19 declaration under Section 56 4(b)(1) of the Act, 21 U.S.C. section 360bbb-3(b)(1), unless the authorization is terminated or revoked sooner. Performed at [facility], [place] [address], [place], [place] [address]   Respiratory Panel by RT PCR (Flu A&B, Covid) - Nasopharyngeal Swab     Status: None   Collection Time: 12/30/19  9:50 AM   Specimen: Nasopharyngeal Swab  Result Value Ref Range   SARS Coronavirus 2  by RT PCR NEGATIVE NEGATIVE    Comment: (NOTE) SARS-CoV-2 target nucleic acids are NOT DETECTED. The SARS-CoV-2 RNA is generally detectable in upper respiratoy specimens during the acute phase of infection. The lowest concentration of SARS-CoV-2 viral copies this assay can detect is 131 copies/mL. A negative result does not preclude SARS-Cov-2 infection and should not be used as the sole basis for treatment or other patient management decisions. A negative result may occur with  improper specimen collection/handling, submission of specimen other than nasopharyngeal swab, presence of viral mutation(s) within the areas targeted by this assay, and inadequate number of viral copies (<131 copies/mL). A negative result must be combined with clinical observations, patient history, and epidemiological information. The expected result is Negative. Fact Sheet for Patients:   PinkCheek.be Fact Sheet for Healthcare Providers:  GravelBags.it This test is not yet ap proved or cleared by the [place] FDA and  has been authorized for detection and/or diagnosis of SARS-CoV-2 by FDA under an Emergency Use Authorization (EUA). This EUA will remain  in effect (meaning this test can be used) for the duration of the COVID-19 declaration under Section 564(b)(1) of the Act, 21 U.S.C. section 360bbb-3(b)(1), unless the authorization is terminated or revoked sooner.    Influenza A by PCR NEGATIVE NEGATIVE   Influenza B by PCR NEGATIVE NEGATIVE    Comment: (NOTE) The Xpert Xpress SARS-CoV-2/FLU/RSV assay is intended as an aid in  the diagnosis of influenza from Nasopharyngeal swab specimens and  should not be used as a sole basis for treatment. Nasal washings and  aspirates are unacceptable for Xpert Xpress SARS-CoV-2/FLU/RSV  testing. Fact Sheet for Patients: PinkCheek.be Fact Sheet for Healthcare Providers: GravelBags.it This test is not yet approved or cleared by the [place] FDA and  has been authorized for detection and/or diagnosis of SARS-CoV-2 by  FDA under an Emergency Use Authorization (EUA). This EUA will remain  in effect (meaning this test can be used) for the duration of the  Covid-19 declaration under Section 564(b)(1) of the Act, 21  U.S.C. section 360bbb-3(b)(1), unless the authorization is  terminated or revoked. Performed at [facility], [place] [address], [place], [place] [address]   Glucose, capillary     Status: None   Collection Time: 12/30/19 10:33 AM  Result Value Ref Range   Glucose-Capillary 93 70 - 99 mg/dL    Comment: Glucose reference range applies only to samples taken after fasting for at least 8 hours.  Type and screen     Status: None   Collection Time: 12/30/19 11:11 AM  Result Value Ref Range    ABO/RH(D) O POS    Antibody Screen NEG    Sample Expiration      01/02/2020,2359 Performed at [facility], [place] [address], [place], [place] [address]   ABO/Rh     Status: None   Collection Time: 12/30/19 11:11 AM  Result Value Ref Range   ABO/RH(D)      O POS Performed at [facility] [address], [place], [place] [address]   APTT     Status: None   Collection Time: 12/30/19 11:16 AM  Result Value Ref Range   aPTT 32 24 - 36 seconds    Comment: Performed at [facility] [address], [place], [place] [address]  CBC     Status: None   Collection Time: 12/30/19 11:16 AM  Result Value Ref Range   WBC 5.5 4.0 - 10.5 K/uL  RBC 4.01 3.87 - 5.11 MIL/uL   Hemoglobin 12.4 12.0 - 15.0 g/dL   HCT 38.7 36.0 - 46.0 %   MCV 96.5 80.0 - 100.0 fL   MCH 30.9 26.0 - 34.0 pg   MCHC 32.0 30.0 - 36.0 g/dL   RDW 13.5 11.5 - 15.5 %   Platelets 272 150 - 400 K/uL   nRBC 0.0 0.0 - 0.2 %    Comment: Performed at [facility], [place] [address], [place], [place] [address]  Comprehensive metabolic panel     Status: Abnormal   Collection Time: 12/30/19 11:16 AM  Result Value Ref Range   Sodium 142 135 - 145 mmol/L   Potassium 4.3 3.5 - 5.1 mmol/L   Chloride 106 98 - 111 mmol/L   CO2 28 22 - 32 mmol/L   Glucose, Bld 107 (H) 70 - 99 mg/dL    Comment: Glucose reference range applies only to samples taken after fasting for at least 8 hours.   BUN 9 8 - 23 mg/dL   Creatinine, Ser 0.76 0.44 - 1.00 mg/dL   Calcium 10.0 8.9 - 10.3 mg/dL   Total Protein 7.4 6.5 - 8.1 g/dL   Albumin 4.2 3.5 - 5.0 g/dL   AST 21 15 - 41 U/L   ALT 23 0 - 44 U/L   Alkaline Phosphatase 73 38 - 126 U/L   Total Bilirubin 0.6 0.3 - 1.2 mg/dL   GFR calc non Af Amer >60 >60 mL/min   GFR calc Af Amer >60 >60 mL/min   Anion gap 8 5 - 15    Comment: Performed at [facility], [place] [address], [place], [place] [address]  Protime-INR     Status: None   Collection Time: 12/30/19 11:16 AM  Result  Value Ref Range   Prothrombin Time 14.4 11.4 - 15.2 seconds   INR 1.1 0.8 - 1.2    Comment: (NOTE) INR goal varies based on device and disease states. Performed at [facility], [place] [address] [place], [place] [address]   Urinalysis, Routine w reflex microscopic     Status: None   Collection Time: 12/30/19 11:16 AM  Result Value Ref Range   Color, Urine YELLOW YELLOW   APPearance CLEAR CLEAR   Specific Gravity, Urine 1.017 1.005 - 1.030   pH 5.0 5.0 - 8.0   Glucose, UA NEGATIVE NEGATIVE mg/dL   Hgb urine dipstick NEGATIVE NEGATIVE   Bilirubin Urine NEGATIVE NEGATIVE   Ketones, ur NEGATIVE NEGATIVE mg/dL   Protein, ur NEGATIVE NEGATIVE mg/dL   Nitrite NEGATIVE NEGATIVE   Leukocytes,Ua NEGATIVE NEGATIVE    Comment: Performed at [facility] [address], [place], [place] [address]  POCT Activated clotting time     Status: None   Collection Time: 12/30/19  1:54 PM  Result Value Ref Range   Activated Clotting Time 307 seconds  Glucose, capillary     Status: Abnormal   Collection Time: 12/30/19  3:20 PM  Result Value Ref Range   Glucose-Capillary 107 (H) 70 - 99 mg/dL    Comment: Glucose reference range applies only to samples taken after fasting for at least 8 hours.  Glucose, capillary     Status: Abnormal   Collection Time: 12/30/19  4:55 PM  Result Value Ref Range   Glucose-Capillary 127 (H) 70 - 99 mg/dL    Comment: Glucose reference range applies only to samples taken after fasting for at least 8 hours.  Comment 1 Notify RN   Hemoglobin A1c     Status: Abnormal   Collection Time: 12/30/19  6:00 PM  Result Value Ref Range   Hgb A1c MFr Bld 5.9 (H) 4.8 - 5.6 %    Comment: (NOTE)         Prediabetes: 5.7 - 6.4         Diabetes: >6.4         Glycemic control for adults with diabetes: <7.0    Mean Plasma Glucose 123 mg/dL    Comment: (NOTE) Performed At: [facility] [place], [place] [id number] Rush Farmer MD [id number]    Glucose, capillary     Status: Abnormal   Collection Time: 12/30/19  9:06 PM  Result Value Ref Range   Glucose-Capillary 174 (H) 70 - 99 mg/dL    Comment: Glucose reference range applies only to samples taken after fasting for at least 8 hours.  CBC     Status: Abnormal   Collection Time: 12/31/19  4:50 AM  Result Value Ref Range   WBC 6.8 4.0 - 10.5 K/uL   RBC 3.10 (L) 3.87 - 5.11 MIL/uL   Hemoglobin 9.6 (L) 12.0 - 15.0 g/dL    Comment: REPEATED TO VERIFY DELTA CHECK NOTED    HCT 29.3 (L) 36.0 - 46.0 %   MCV 94.5 80.0 - 100.0 fL   MCH 31.0 26.0 - 34.0 pg   MCHC 32.8 30.0 - 36.0 g/dL   RDW 13.4 11.5 - 15.5 %   Platelets 216 150 - 400 K/uL   nRBC 0.0 0.0 - 0.2 %    Comment: Performed at [facility], [place] [address], [place], [place] [address]  Basic metabolic panel     Status: Abnormal   Collection Time: 12/31/19  4:50 AM  Result Value Ref Range   Sodium 136 135 - 145 mmol/L   Potassium 4.3 3.5 - 5.1 mmol/L   Chloride 103 98 - 111 mmol/L   CO2 24 22 - 32 mmol/L   Glucose, Bld 119 (H) 70 - 99 mg/dL    Comment: Glucose reference range applies only to samples taken after fasting for at least 8 hours.   BUN 11 8 - 23 mg/dL   Creatinine, Ser 0.71 0.44 - 1.00 mg/dL   Calcium 9.3 8.9 - 10.3 mg/dL   GFR calc non Af Amer >60 >60 mL/min   GFR calc Af Amer >60 >60 mL/min   Anion gap 9 5 - 15    Comment: Performed at [facility] [address], [place], [place] [address]  Glucose, capillary     Status: Abnormal   Collection Time: 12/31/19  6:05 AM  Result Value Ref Range   Glucose-Capillary 108 (H) 70 - 99 mg/dL    Comment: Glucose reference range applies only to samples taken after fasting for at least 8 hours.  Glucose, capillary     Status: Abnormal   Collection Time: 12/31/19 11:07 AM  Result Value Ref Range   Glucose-Capillary 104 (H) 70 - 99 mg/dL    Comment: Glucose reference range applies only to samples taken after fasting for at least 8 hours.  Basic  metabolic panel     Status: Abnormal   Collection Time: 01/15/20  1:24 PM  Result Value Ref Range   Sodium 137 135 - 145 mmol/L   Potassium 3.9 3.5 - 5.1 mmol/L   Chloride 105 98 - 111 mmol/L   CO2 25 22 - 32  mmol/L   Glucose, Bld 108 (H) 70 - 99 mg/dL    Comment: Glucose reference range applies only to samples taken after fasting for at least 8 hours.   BUN 19 8 - 23 mg/dL   Creatinine, Ser 0.80 0.44 - 1.00 mg/dL   Calcium 9.5 8.9 - 10.3 mg/dL   GFR calc non Af Amer >60 >60 mL/min   GFR calc Af Amer >60 >60 mL/min   Anion gap 7 5 - 15    Comment: Performed at [name], [place] [place], [place] [address]  CBC     Status: Abnormal   Collection Time: 01/15/20  1:24 PM  Result Value Ref Range   WBC 6.1 4.0 - 10.5 K/uL   RBC 3.40 (L) 3.87 - 5.11 MIL/uL   Hemoglobin 10.7 (L) 12.0 - 15.0 g/dL   HCT 31.5 (L) 36.0 - 46.0 %   MCV 92.6 80.0 - 100.0 fL   MCH 31.5 26.0 - 34.0 pg   MCHC 34.0 30.0 - 36.0 g/dL   RDW 13.4 11.5 - 15.5 %   Platelets 278 150 - 400 K/uL   nRBC 0.0 0.0 - 0.2 %    Comment: Performed at [facility], [address] [place], [place] [address]    -------------------------------------------------------------------------- A&P:  Problem List Items Addressed This Visit      Cardiovascular and Mediastinum   Hypertension associated with diabetes ([place])    Reports stable and well controlled hypertension but visit is over the phone today and unable to measure blood pressure in clinic.  BP is at goal < 130/80.  Pt is working on lifestyle modifications.  Taking medications tolerating well without side effects.   Plan: 1. Has been diet controlled, was previously taking Lisinopril '10mg'$  daily. 2. Obtain labs in the next 7-10 days  3. Encouraged heart healthy diet and increasing exercise to 30 minutes most days of the week, going no more than 2 days in a row without exercise. 4. Check BP 1-2 x per week at home, keep log, and bring to clinic at  next appointment. 5. Follow up 6 months.         Relevant Orders   CBC with Differential   COMPLETE METABOLIC PANEL WITH GFR     Endocrine   Hyperlipidemia associated with type 2 diabetes mellitus ([place])   Relevant Orders   Lipid Profile   Diabetes mellitus type 2, controlled, with complications ([place])    Well-controlledDM with A1c 5.9% on 12/30/2019 improved from 6.2% on 10/17/2019 and goal A1c < 7.0%.  Has continued to take Metformin XR '500mg'$  tablet twice daily and tolerating well.  Plan:  1. Continue current therapy: Metformin XR '500mg'$  twice daily 2. Encourage improved lifestyle: - low carb/low glycemic diet reinforced prior education - Increase physical activity to 30 minutes most days of the week.  Explained that increased physical activity increases body's use of sugar for energy. 3. Check fasting am CBG and write these down.  Bring log to next visit for review 4. Continue ASA and Statin 5. Advised to schedule DM ophtho exam, send record. 6. Follow-up 6 months      Relevant Orders   Microalbumin, urine   CBC with Differential   COMPLETE METABOLIC PANEL WITH GFR    Other Visit Diagnoses    Ear pain, left    -  Primary   Relevant Medications   ondansetron (ZOFRAN) 4 MG tablet   ibuprofen (ADVIL) 600 MG tablet   Nausea  Relevant Medications   ondansetron (ZOFRAN) 4 MG tablet   ibuprofen (ADVIL) 600 MG tablet   Long-term use of high-risk medication       Relevant Orders   Thyroid Panel With TSH      Meds ordered this encounter  Medications  . ondansetron (ZOFRAN) 4 MG tablet    Sig: Take 2 tablets (8 mg total) by mouth every 8 (eight) hours as needed for nausea or vomiting.    Dispense:  20 tablet    Refill:  0  . ibuprofen (ADVIL) 600 MG tablet    Sig: Take 1 tablet (600 mg total) by mouth every 8 (eight) hours as needed.    Dispense:  30 tablet    Refill:  0    Follow-up: - Return this Thursday/Friday for suture removal, diabetic foot exam, and  labs  Patient verbalizes understanding with the above medical recommendations including the limitation of remote medical advice.  Specific follow-up and call-back criteria were given for patient to follow-up or seek medical care more urgently if needed.   - Time spent in direct consultation with patient on phone: 6 minutes  Harlin Rain, [facility] 01/19/2020, 9:17 AM

## 2020-01-19 NOTE — Assessment & Plan Note (Signed)
Reports stable and well controlled hypertension but visit is over the phone today and unable to measure blood pressure in clinic.  BP is at goal < 130/80.  Pt is working on lifestyle modifications.  Taking medications tolerating well without side effects.   Plan: 1. Has been diet controlled, was previously taking Lisinopril 10mg  daily. 2. Obtain labs in the next 7-10 days  3. Encouraged heart healthy diet and increasing exercise to 30 minutes most days of the week, going no more than 2 days in a row without exercise. 4. Check BP 1-2 x per week at home, keep log, and bring to clinic at next appointment. 5. Follow up 6 months.

## 2020-01-19 NOTE — Patient Instructions (Signed)
As we discussed, we will have you return to clinic on Thursday/Friday for your suture removal from your left ear laceration.  I have sent in two prescriptions, once if an antinausea medication (Ondansetron) to take 1-2 tablets every 8 hours as needed for nausea.  Can take this 1 hour before taking the ibuprofen since that medication has a history of making your nauseated.  This should hopefully help with your left ear pain.  We will plan to have your labs drawn on Thursday/Friday and can do your diabetic foot exam at that time as well.  Please call us with any questions/concerns/needs  You will receive a survey after today's visit either digitally by e-mail or paper by [place] mail. Your experiences and feedback matter to [place].  Please respond so we know how we are doing as we provide care for you.  Call us with any questions/concerns/needs.  It is my goal to be available to you for your health concerns.  Thanks for choosing me to be a partner in your healthcare needs!  Harlin Rain, FNP-C Family Nurse Practitioner [facility] Phone: [phone]

## 2020-01-19 NOTE — Assessment & Plan Note (Signed)
Well-controlledDM with A1c 5.9% on 12/30/2019 improved from 6.2% on 10/17/2019 and goal A1c < 7.0%.  Has continued to take Metformin XR 500mg  tablet twice daily and tolerating well.  Plan:  1. Continue current therapy: Metformin XR 500mg  twice daily 2. Encourage improved lifestyle: - low carb/low glycemic diet reinforced prior education - Increase physical activity to 30 minutes most days of the week.  Explained that increased physical activity increases body's use of sugar for energy. 3. Check fasting am CBG and write these down.  Bring log to next visit for review 4. Continue ASA and Statin 5. Advised to schedule DM ophtho exam, send record. 6. Follow-up 6 months

## 2020-01-20 ENCOUNTER — Telehealth: Payer: Self-pay

## 2020-01-20 ENCOUNTER — Ambulatory Visit: Payer: Self-pay

## 2020-01-20 NOTE — Telephone Encounter (Signed)
I met with patient virtually yesterday and sent in an Rx for ondansetron and ibuprofen.  She said the ibuprofen makes her nauseated but was ok with taking the ondansetron before the ibuprofen.  Has she tried this?

## 2020-01-20 NOTE — Telephone Encounter (Signed)
The pt was notified of the providers recommendation. She verbalize understanding, no questions or concerns.

## 2020-01-20 NOTE — Telephone Encounter (Signed)
Pt. Had a ear injury and seen in ED and had sutures. Reports the head dressing is slipping off. Instructed to wash her hands well, or the person assisting her. She has gauze wrap at home. Will wrap the head dressing to try and secure it. Also reports she can not tolerate Motrin for pain."It tears my stomach up." Having pain to left ear. Request her PCP send something for pain to her pharmacy. Please advise pt.

## 2020-01-23 ENCOUNTER — Ambulatory Visit (INDEPENDENT_AMBULATORY_CARE_PROVIDER_SITE_OTHER): Payer: Medicare Other | Admitting: Family Medicine

## 2020-01-23 ENCOUNTER — Encounter: Payer: Medicare Other | Admitting: Vascular Surgery

## 2020-01-23 ENCOUNTER — Other Ambulatory Visit: Payer: Self-pay

## 2020-01-23 ENCOUNTER — Encounter: Payer: Self-pay | Admitting: Family Medicine

## 2020-01-23 VITALS — BP 126/80 | HR 87 | Temp 97.1°F | Resp 18 | Ht 63.0 in | Wt 121.6 lb

## 2020-01-23 DIAGNOSIS — Z4802 Encounter for removal of sutures: Secondary | ICD-10-CM | POA: Diagnosis not present

## 2020-01-23 DIAGNOSIS — Z794 Long term (current) use of insulin: Secondary | ICD-10-CM | POA: Diagnosis not present

## 2020-01-23 DIAGNOSIS — E118 Type 2 diabetes mellitus with unspecified complications: Secondary | ICD-10-CM | POA: Diagnosis not present

## 2020-01-23 DIAGNOSIS — Z1211 Encounter for screening for malignant neoplasm of colon: Secondary | ICD-10-CM

## 2020-01-23 DIAGNOSIS — T148XXA Other injury of unspecified body region, initial encounter: Secondary | ICD-10-CM

## 2020-01-23 DIAGNOSIS — E1142 Type 2 diabetes mellitus with diabetic polyneuropathy: Secondary | ICD-10-CM | POA: Diagnosis not present

## 2020-01-23 LAB — POCT UA - MICROALBUMIN: Microalbumin Ur, POC: 20 mg/L

## 2020-01-23 MED ORDER — MUPIROCIN 2 % EX OINT: 1.0000 "application " | TOPICAL_OINTMENT | Freq: Every day | CUTANEOUS | 0 refills | Status: DC

## 2020-01-23 MED ORDER — LISINOPRIL 5 MG PO TABS: 5.0000 mg | ORAL_TABLET | Freq: Every day | ORAL | 3 refills | Status: DC

## 2020-01-23 NOTE — Patient Instructions (Addendum)
Your medication refills have been sent to your pharmacy on file.  I have sent in Mupirocin topical ointment to put on the back of your left ear daily.    We have removed 6 sutures from your left ear in clinic today.  I have sent in a prescription for Lisinopril 5mg  to begin taking once daily.    START lisinopril 5 mg once daily.   Some of the possible side effects are:  - angioedema: swelling of lips, mouth, and tongue.  If this rare side effect occurs, please go to ED. - cough: you could develop a dry, hacking cough caused by this medicine.  If it occurs, it will go away after stopping this medicine.  Call the clinic before stopping the medication. - kidney damage: we will monitor your labs when we start this medicine and at least one time per year.  If you do not have an change in kidney function when starting this medicine, it will provide kidney protection over time.  We will plan to have your labs drawn in 2 weeks.  I have sent in a referral to establish with Podiatry and for Colonoscopy.  If you have not heard from anyone in the next week, to please contact our office and we will follow up with them.  For Mammogram screening for breast cancer   Call the Force below anytime to schedule your own appointment now that order has been placed.  [facility] [place], [place] [address] Phone: [phone]  [facility] [address] [place], [place] [address] Phone: [phone]  We will plan to see you back in 3 months for follow up on your Diabetes  You will receive a survey after today's visit either digitally by e-mail or paper by [facility] [facility] mail. Your experiences and feedback matter to [place].  Please respond so we know how we are doing as we provide care for you.  Call us with any questions/concerns/needs.  It is my goal to be available to you for your health concerns.  Thanks for choosing me to be  a partner in your healthcare needs!  Harlin Rain, FNP-C Family Nurse Practitioner [facility] Phone: [phone]

## 2020-01-23 NOTE — Assessment & Plan Note (Signed)
6 sutures removed from upper back of left ear.  Wound with good healing, no s/s of infection.  Directed to use topical mupirocin once daily until fully healed.  Plan: 1. Topical mupirocin once daily to wound. 2. Call with any worsening of symptoms or questions/concerns 3. Follow up as needed

## 2020-01-23 NOTE — Assessment & Plan Note (Signed)
Urine in clinic today showing microalbumin.  Reviewed medications with patient and previously was on lisinopril but had stopped due to better control over blood pressure.  Discussed with patient restarting at low dose of 5mg  to work towards kidney protection over time with her current diabetes diagnosis.  Patient in agreement.  Declines diabetic foot exam.  Note received from in home assessment showing unable to perform circulation screening secondary to toenails.  Discussed importance of foot exams and foot care.  Offered and accepted referral to podiatry.  Plan: 1. Restart lisinopril 5mg  daily 2. Return to clinic in 2 weeks for labs for review of kidney function 3. Will see you back in clinic in 3 months for your next diabetes follow up visit 4. Referral placed to podiatry

## 2020-01-23 NOTE — Assessment & Plan Note (Signed)
Pt requiring colon cancer screening.   Plan: - Discussed timing for initiation of colon cancer screening ACS vs USPSTF guidelines - Referral to GI placed for colonoscopy

## 2020-01-23 NOTE — Progress Notes (Signed)
Subjective:    Patient ID: Kendra [name], female    DOB: 1946/03/01, 74 y.o.   MRN: [id number]  SIOMARA [name] is a 74 y.o. female presenting on 01/23/2020 for Ear Injury (Sutures removed from left ear. Pt fell and injured left ear x 8 days ago )   HPI  [name] presents to clinic for suture removal from a left ear injury approx 8 days ago.  Sutures were placed at [facility] and note reviewed revealing 6 sutures in a running knot placed.  Reports discomfort at site of sutures but denies fevers, drainage, headaches, throbbing or sleep disturbances.    Depression screen [facility] 2/9 11/04/2019 09/22/2019 04/30/2019  Decreased Interest 0 3 3  Down, Depressed, Hopeless 1 3 2   PHQ - 2 Score 1 6 5   Altered sleeping 0 0 2  Tired, decreased energy 0 0 1  Change in appetite 1 0 0  Feeling bad or failure about yourself  0 0 0  Trouble concentrating 0 0 0  Moving slowly or fidgety/restless 0 0 1  Suicidal thoughts 0 0 0  PHQ-9 Score 2 6 9   Difficult doing work/chores Not difficult at all Not difficult at all Not difficult at all  Some recent data might be hidden    Social History   Tobacco Use  . Smoking status: Current Some Day Smoker    Types: Cigarettes  . Smokeless tobacco: Former Systems developer    Types: Snuff  . Tobacco comment: .5 of 1 cigarette a day  Substance Use Topics  . Alcohol use: No  . Drug use: No    Review of Systems  Constitutional: Negative.   HENT: Negative.   Eyes: Negative.   Respiratory: Negative.   Cardiovascular: Negative.   Gastrointestinal: Negative.   Endocrine: Negative.   Genitourinary: Negative.   Musculoskeletal: Negative.   Skin: Negative.        6 sutures behind left ear for removal  Allergic/Immunologic: Negative.   Neurological: Negative.   Hematological: Negative.   Psychiatric/Behavioral: Negative.    Per HPI unless specifically indicated above     Objective:    BP 126/80   Pulse 87   Temp (!) 97.1 F (36.2 C) (Temporal)   Resp 18   Ht  5\' 3"  (1.6 m)   Wt 121 lb 9.6 oz (55.2 kg)   SpO2 99%   BMI 21.54 kg/m   Wt Readings from Last 3 Encounters:  01/23/20 121 lb 9.6 oz (55.2 kg)  01/15/20 119 lb 0.8 oz (54 kg)  01/08/20 119 lb 6.4 oz (54.2 kg)    Physical Exam Vitals reviewed.  Constitutional:      General: She is not in acute distress.    Appearance: Normal appearance. She is well-developed and normal weight. She is not ill-appearing or toxic-appearing.  HENT:     Head: Normocephalic.  Eyes:     General: Lids are normal. Vision grossly intact.        Right eye: No discharge.        Left eye: No discharge.     Conjunctiva/sclera: Conjunctivae normal.  Cardiovascular:     Rate and Rhythm: Normal rate.     Pulses: Normal pulses.  Pulmonary:     Effort: Pulmonary effort is normal.  Musculoskeletal:     Right lower leg: No edema.     Left lower leg: No edema.  Skin:    General: Skin is warm and dry.     Capillary Refill: Capillary refill  takes less than 2 seconds.     Findings: Laceration present.     Comments: Left ear with healing laceration, 6 sutures in place.  No s/s of infection/drainage.  Neurological:     General: No focal deficit present.     Mental Status: She is alert and oriented to person, place, and time.     Cranial Nerves: No cranial nerve deficit.     Sensory: No sensory deficit.     Motor: No weakness.     Coordination: Coordination normal.     Gait: Gait normal.  Psychiatric:        Attention and Perception: Attention and perception normal.        Mood and Affect: Mood and affect normal.        Speech: Speech normal.        Behavior: Behavior normal. Behavior is cooperative.        Thought Content: Thought content normal.        Cognition and Memory: Cognition and memory normal.        Judgment: Judgment normal.     Results for orders placed or performed in visit on 01/23/20  POCT UA - Microalbumin  Result Value Ref Range   Microalbumin Ur, POC 20 mg/L   Creatinine, POC      Albumin/Creatinine Ratio, Urine, POC        Assessment & Plan:   Problem List Items Addressed This Visit      Endocrine   Diabetic peripheral neuropathy associated with type 2 diabetes mellitus ([place])    Urine in clinic today showing microalbumin.  Reviewed medications with patient and previously was on lisinopril but had stopped due to better control over blood pressure.  Discussed with patient restarting at low dose of 5mg  to work towards kidney protection over time with her current diabetes diagnosis.  Patient in agreement.  Declines diabetic foot exam.  Note received from in home assessment showing unable to perform circulation screening secondary to toenails.  Discussed importance of foot exams and foot care.  Offered and accepted referral to podiatry.  Plan: 1. Restart lisinopril 5mg  daily 2. Return to clinic in 2 weeks for labs for review of kidney function 3. Will see you back in clinic in 3 months for your next diabetes follow up visit 4. Referral placed to podiatry      Relevant Medications   lisinopril (ZESTRIL) 5 MG tablet   Diabetes mellitus type 2, controlled, with complications ([place]) - Primary   Relevant Medications   lisinopril (ZESTRIL) 5 MG tablet   Other Relevant Orders   Urinalysis, microscopic only   POCT UA - Microalbumin (Completed)   Ambulatory referral to Podiatry     Other   Visit for suture removal    6 sutures removed from upper back of left ear.  Wound with good healing, no s/s of infection.  Directed to use topical mupirocin once daily until fully healed.  Plan: 1. Topical mupirocin once daily to wound. 2. Call with any worsening of symptoms or questions/concerns 3. Follow up as needed      Encounter for screening colonoscopy    Pt requiring colon cancer screening.   Plan: - Discussed timing for initiation of colon cancer screening ACS vs USPSTF guidelines - Referral to GI placed for colonoscopy       Other Visit Diagnoses    Colon cancer  screening       Relevant Orders   Ambulatory referral to Gastroenterology   Sutured  skin wound       Relevant Medications   mupirocin ointment (BACTROBAN) 2 %      Meds ordered this encounter  Medications  . lisinopril (ZESTRIL) 5 MG tablet    Sig: Take 1 tablet (5 mg total) by mouth daily.    Dispense:  90 tablet    Refill:  3  . mupirocin ointment (BACTROBAN) 2 %    Sig: Apply 1 application topically daily.    Dispense:  22 g    Refill:  0      Follow up plan: Return in about 3 months (around 04/24/2020) for A1C, DM follow up.   Harlin Rain, [place] Family Nurse Practitioner [facility] 01/23/2020, 10:32 AM

## 2020-01-24 LAB — URINALYSIS, MICROSCOPIC ONLY
Bacteria, UA: NONE SEEN /HPF
Hyaline Cast: NONE SEEN /LPF
RBC / HPF: NONE SEEN /HPF (ref 0–2)
Squamous Epithelial / HPF: NONE SEEN /HPF (ref <=5)

## 2020-01-27 ENCOUNTER — Telehealth: Payer: Self-pay | Admitting: Family Medicine

## 2020-01-27 NOTE — Telephone Encounter (Signed)
Called pt to let her know the Glucerna Samples were available for her to pick up at [facility] and to ask for Rachell. She stated that she will get them when she comes in for her appt with Elmyra Ricks on Thurs. @11am  Cc: Julious Oka

## 2020-01-28 ENCOUNTER — Telehealth: Payer: Medicare Other

## 2020-01-28 ENCOUNTER — Telehealth: Payer: Self-pay | Admitting: Family Medicine

## 2020-01-28 NOTE — Telephone Encounter (Signed)
Pt called and left message she had turned down colon testing and made a mestake, is willing to do will takl to dr at appt on 5/13

## 2020-01-29 ENCOUNTER — Ambulatory Visit
Admission: RE | Admit: 2020-01-29 | Discharge: 2020-01-29 | Disposition: A | Discharge status: Home / Self Care | Payer: Medicare Other | Source: Ambulatory Visit | Attending: Family Medicine | Admitting: Family Medicine

## 2020-01-29 ENCOUNTER — Other Ambulatory Visit: Payer: Self-pay

## 2020-01-29 ENCOUNTER — Encounter: Payer: Self-pay | Admitting: Family Medicine

## 2020-01-29 ENCOUNTER — Ambulatory Visit (INDEPENDENT_AMBULATORY_CARE_PROVIDER_SITE_OTHER): Payer: Medicare Other | Admitting: Family Medicine

## 2020-01-29 VITALS — BP 119/61 | HR 78 | Temp 97.1°F | Ht 63.0 in | Wt 123.2 lb

## 2020-01-29 DIAGNOSIS — M79641 Pain in right hand: Secondary | ICD-10-CM

## 2020-01-29 MED ORDER — DICLOFENAC SODIUM 1 % EX GEL: 2.0000 g | Freq: Four times a day (QID) | CUTANEOUS | 1 refills | Status: DC

## 2020-01-29 NOTE — Assessment & Plan Note (Signed)
Reports fall in March 2021 and hitting right hand at that time.  Area of swelling feels like bone on exam, full ROM without decreased grip strength.  Will complete xray for abnormality and is wearing compression gloves.  Discussed use of topical diclofenac gel for inflammation.  Plan: 1. Right hand xray completed today 2. Topical diclofenac gel 1% sent to pharmacy on file, instructed to use 3-4x per day, as needed, for pain in the right hand.

## 2020-01-29 NOTE — Progress Notes (Signed)
Subjective:    Patient ID: Kendra [name], female    DOB: 1945/11/22, 74 y.o.   MRN: [id number]  Kendra [name] is a 74 y.o. female presenting on 01/29/2020 for Joint Pain (Rt hand pain with a knot on the thumb x 3 mths)   HPI  Kendra [name] presents to clinic for concerns of right hand pain x 3 months after a fall.  Reports had hurt her hand after she fell in March 2021.  Has tried to take acetaminophen and use topical icyhot without relief of her symptoms.  Has not used oral ibuprofen due to GI upset.  Denies any numbness, tingling, weakness of the right hand, difficulty opening doors, jars, sleep disturbance or swelling.  Denies previous injury/trauma to the right hand/  Depression screen [facility] 2/9 11/04/2019 09/22/2019 04/30/2019  Decreased Interest 0 3 3  Down, Depressed, Hopeless 1 3 2   PHQ - 2 Score 1 6 5   Altered sleeping 0 0 2  Tired, decreased energy 0 0 1  Change in appetite 1 0 0  Feeling bad or failure about yourself  0 0 0  Trouble concentrating 0 0 0  Moving slowly or fidgety/restless 0 0 1  Suicidal thoughts 0 0 0  PHQ-9 Score 2 6 9   Difficult doing work/chores Not difficult at all Not difficult at all Not difficult at all  Some recent data might be hidden    Social History   Tobacco Use  . Smoking status: Current Some Day Smoker    Types: Cigarettes  . Smokeless tobacco: Former Systems developer    Types: Snuff  . Tobacco comment: .5 of 1 cigarette a day  Substance Use Topics  . Alcohol use: No  . Drug use: No    Review of Systems  Constitutional: Negative.   HENT: Negative.   Eyes: Negative.   Respiratory: Negative.   Cardiovascular: Negative.   Gastrointestinal: Negative.   Endocrine: Negative.   Genitourinary: Negative.   Musculoskeletal: Positive for arthralgias. Negative for back pain, gait problem, joint swelling, myalgias, neck pain and neck stiffness.  Skin: Negative.   Allergic/Immunologic: Negative.   Neurological: Negative.   Hematological: Negative.    Psychiatric/Behavioral: Negative.    Per HPI unless specifically indicated above     Objective:    BP 119/61   Pulse 78   Temp (!) 97.1 F (36.2 C) (Temporal)   Ht 5\' 3"  (1.6 m)   Wt 123 lb 3.2 oz (55.9 kg)   BMI 21.82 kg/m   Wt Readings from Last 3 Encounters:  01/29/20 123 lb 3.2 oz (55.9 kg)  01/23/20 121 lb 9.6 oz (55.2 kg)  01/15/20 119 lb 0.8 oz (54 kg)    Physical Exam Vitals reviewed.  Constitutional:      General: She is not in acute distress.    Appearance: Normal appearance. She is normal weight. She is not ill-appearing or toxic-appearing.  HENT:     Head: Normocephalic.  Eyes:     General: Lids are normal. Vision grossly intact.        Right eye: No discharge.        Left eye: No discharge.     Extraocular Movements: Extraocular movements intact.     Conjunctiva/sclera: Conjunctivae normal.     Pupils: Pupils are equal, round, and reactive to light.  Cardiovascular:     Rate and Rhythm: Normal rate and regular rhythm.     Pulses: Normal pulses.     Heart sounds: Normal heart sounds.  No murmur. No friction rub. No gallop.   Pulmonary:     Effort: Pulmonary effort is normal. No respiratory distress.     Breath sounds: Normal breath sounds.  Musculoskeletal:        General: Tenderness present. No swelling or deformity.     Right wrist: Normal.     Left wrist: Normal.     Right hand: Tenderness present. No swelling, deformity or lacerations. Normal range of motion. Normal strength. Normal sensation. Normal capillary refill. Normal pulse.     Left hand: Normal.       Arms:     Right lower leg: No edema.     Left lower leg: No edema.  Skin:    General: Skin is warm and dry.     Capillary Refill: Capillary refill takes less than 2 seconds.  Neurological:     General: No focal deficit present.     Mental Status: She is alert and oriented to person, place, and time.     Cranial Nerves: No cranial nerve deficit.     Sensory: No sensory deficit.      Motor: No weakness.     Coordination: Coordination normal.     Gait: Gait normal.  Psychiatric:        Attention and Perception: Attention and perception normal.        Mood and Affect: Mood and affect normal.        Speech: Speech normal.        Behavior: Behavior normal. Behavior is cooperative.        Thought Content: Thought content normal.        Cognition and Memory: Cognition and memory normal.        Judgment: Judgment normal.     Results for orders placed or performed in visit on 01/23/20  Urinalysis, microscopic only  Result Value Ref Range   WBC, UA 0-5 0 - 5 /HPF   RBC / HPF NONE SEEN 0 - 2 /HPF   Squamous Epithelial / LPF NONE SEEN < OR = 5 /HPF   Bacteria, UA NONE SEEN NONE SEEN /HPF   Hyaline Cast NONE SEEN NONE SEEN /LPF  POCT UA - Microalbumin  Result Value Ref Range   Microalbumin Ur, POC 20 mg/L   Creatinine, POC     Albumin/Creatinine Ratio, Urine, POC        Assessment & Plan:   Problem List Items Addressed This Visit      Other   Right hand pain - Primary    Reports fall in March 2021 and hitting right hand at that time.  Area of swelling feels like bone on exam, full ROM without decreased grip strength.  Will complete xray for abnormality and is wearing compression gloves.  Discussed use of topical diclofenac gel for inflammation.  Plan: 1. Right hand xray completed today 2. Topical diclofenac gel 1% sent to pharmacy on file, instructed to use 3-4x per day, as needed, for pain in the right hand.      Relevant Medications   diclofenac Sodium (VOLTAREN) 1 % GEL   Other Relevant Orders   DG Hand Complete Right      Meds ordered this encounter  Medications  . diclofenac Sodium (VOLTAREN) 1 % GEL    Sig: Apply 2 g topically 4 (four) times daily.    Dispense:  50 g    Refill:  1      Follow up plan: Return if symptoms worsen or fail to  improve.   Harlin Rain, [place] Family Nurse Practitioner [facility] 01/29/2020, 12:00 PM

## 2020-01-29 NOTE — Patient Instructions (Signed)
As we discussed, likely arthritis pain, but will have your x-ray completed and we will send to radiology for an over-read.  We will be in touch once the x-ray results have returned.  I have sent in a prescription for topical diclofenac gel 1% to apply to the right hand 3-4x per day, as needed, for inflammation/pain.  We will plan to see you back as needed for this.  You will receive a survey after today's visit either digitally by e-mail or paper by [employer]. Your experiences and feedback matter to [place].  Please respond so we know how we are doing as we provide care for you.  Call us with any questions/concerns/needs.  It is my goal to be available to you for your health concerns.  Thanks for choosing me to be a partner in your healthcare needs!  Harlin Rain, FNP-C Family Nurse Practitioner [facility] Phone: [phone]

## 2020-02-04 ENCOUNTER — Telehealth: Payer: Self-pay

## 2020-02-04 ENCOUNTER — Ambulatory Visit (INDEPENDENT_AMBULATORY_CARE_PROVIDER_SITE_OTHER): Payer: Medicare Other | Admitting: Pharmacist

## 2020-02-04 DIAGNOSIS — Z794 Long term (current) use of insulin: Secondary | ICD-10-CM

## 2020-02-04 NOTE — Telephone Encounter (Signed)
Copied from [place] [id number]. Topic: General - Call Back - No Documentation >> Feb 04, 2020  2:49 PM Erick Blinks wrote: Reason for CRM: Pt is requesting a call back from [facility], pt is having problems ordering her blood pressure cuff for her left arm  Best contact: [phone]

## 2020-02-04 NOTE — Telephone Encounter (Signed)
Thank you, [name]!  Return call to patient. [name] lets me know that she was able to order upper arm BP monitor through [name] OTC benefit and it should arrive by Saturday.  [name]

## 2020-02-04 NOTE — Chronic Care Management (AMB) (Signed)
Chronic Care Management   Follow Up Note   02/04/2020 Name: NILA WINKER MRN: [id number] DOB: [birth year]  Referred by: [name], FNP Reason for referral : Chronic Care Management (Patient Phone Call)   MICHALINE KINDIG is a 74 y.o. year old female who is a primary care patient of [name], [place]. The CCM team was consulted for assistance with chronic disease management and care coordination needs.  Ms. Glennie has a medical history which includes but is not limited to CVA, Type 2 Diabetes, hypertension, hyperlipidemia and COPD.    I reached out to [name] by phone today.   Review of patient status, including review of consultants reports, relevant laboratory and other test results, and collaboration with appropriate care team members and the patient's provider was performed as part of comprehensive patient evaluation and provision of chronic care management services.     Outpatient Encounter Medications as of 02/04/2020  Medication Sig  . lisinopril (ZESTRIL) 5 MG tablet Take 1 tablet (5 mg total) by mouth daily.  . metFORMIN (GLUCOPHAGE-XR) 500 MG 24 hr tablet TAKE 1 TABLET BY MOUTH TWICE DAILY BEFORE A MEAL  . mupirocin ointment (BACTROBAN) 2 % Apply 1 application topically daily.  [name] albuterol (VENTOLIN HFA) 108 (90 Base) MCG/ACT inhaler INHALE 1-2 PUFFS EVERY 6 HOURS AS NEEDEDSHORTNESS OF BREATH AND WHEEZING  . aspirin 325 MG tablet Take 325 mg by mouth daily.  [name] atorvastatin (LIPITOR) 40 MG tablet TAKE 1 TABLET BY MOUTH ONCE DAILY  . Blood Glucose Monitoring Suppl (ONETOUCH VERIO) w/Device KIT 1 kit by Does not apply route 2 (two) times daily.  [name] buPROPion (WELLBUTRIN XL) 150 MG 24 hr tablet TAKE 1 TABLET BY MOUTH ONCE DAILY  . Cholecalciferol (VITAMIN D3) 50 MCG (2000 UT) capsule Take 1 capsule (2,000 Units total) by mouth daily.  . clopidogrel (PLAVIX) 75 MG tablet Take 1 tablet (75 mg total) by mouth daily. (Patient taking differently: Take 75 mg by  mouth daily in the afternoon. )  . diclofenac Sodium (VOLTAREN) 1 % GEL Apply 2 g topically 4 (four) times daily.  [name] glucose blood (ONETOUCH VERIO) test strip Use as instructed  . ibuprofen (ADVIL) 600 MG tablet Take 1 tablet (600 mg total) by mouth every 8 (eight) hours as needed.  [name] Devices (ONE TOUCH DELICA LANCING DEV) MISC 1 Device by Does not apply route 2 (two) times daily.  . nicotine polacrilex (COMMIT) 4 MG lozenge Take 4 mg by mouth as needed for smoking cessation.  . ondansetron (ZOFRAN) 4 MG tablet Take 2 tablets (8 mg total) by mouth every 8 (eight) hours as needed for nausea or vomiting.  [name] Lancets 29N MISC 1 Device by Does not apply route 2 (two) times daily.  . TRELEGY ELLIPTA 100-62.5-25 MCG/INH AEPB INHALE 1 PUFF INTO THE LUNGS ONCE DAILY   No facility-administered encounter medications on file as of 02/04/2020.    Goals Addressed            This Visit's Progress   . PharmD-Medication Adherence       Current Barriers:  . Financial Barriers . Knowledge deficits related to coordination of her own care . Limited social support   Pharmacist Clinical Goal(s):   [name] Over the next 30 days, patient will demonstrate improved medication adherence as evidenced by verbalized understanding of prescribed medication regimen, assistance available, and patient report of adherence  Interventions: . Perform chart review. Note patient seen by PCP on  5/7 ? PCP advised patient to restart lisinopril 5mg daily for long-term kidney protection and to return in 2 weeks for labs for review of kidney function . Placed coordination of care call to [name] ? Note patient receives pill packs from pharmacy, which can impact timing of medication changes ? [name] RPh reports lisinopril 5 mg Rx was filled on 5/7 and has been delivered to patient in pill bottle separate from pill pack (planning to add into next pill pack) . Follow up with patient regarding restart of  lisinopril 5 mg once daily ? Reports taking lisinopril 5 mg once daily as directed from pill bottle ? Confirms planning to come to office as directed for follow up lab work on 5/21. Confirms transportation for this appointment scheduled. ? Counsel patient on importance of home BP monitoring. Denies having obtained upper arm BP monitor due to transportation barrier ? Patient states that she will call health plan now to order through OTC benefit. ? Receive call back from patient letting me know that she ordered upper arm BP monitor through [employer] OTC benefit and it should arrive by Saturday. . Counsel on continued importance of blood sugar control  ? Reports taking metformin ER 500 mg twice daily from pill pack ? Reports last checked fasting CBG on 5/12: 128 . Follow up with patient regarding wound behind her left ear ? Reports applying mupirocin ointment once daily as directed by PCP and that wound is healing well  Patient Self Care Activities:  . Patient takes medications as directed with aid of adherence tools. o Using pill packaging from [facility] . Calls pharmacy for medication refills . Patient to attend scheduled medical appointments . Calls provider office for new concerns or questions  . Patient to check blood sugars and keep log . Patient to check blood pressure daily and keep log  Please see past updates related to this goal by clicking on the "Past Updates" button in the selected goal          Plan  The care management team will reach out to the patient again over the next 30 days.   Elisabeth Dhalla, PharmD, BCACP Clinical Pharmacist [facility] [phone] 

## 2020-02-04 NOTE — Patient Instructions (Signed)
Thank you allowing the Chronic Care Management Team to be a part of your care! It was a pleasure speaking with you today!     CCM (Chronic Care Management) Team    [name] RN, MSN, CCM Nurse Care Coordinator  [phone]   [name] PharmD  Clinical Pharmacist  [phone]   [name] LCSW Clinical Social Worker [phone]  Visit Information  Goals Addressed            This Visit's Progress   . PharmD-Medication Adherence       Current Barriers:  . Financial Barriers . Knowledge deficits related to coordination of her own care . Limited social support   Pharmacist Clinical Goal(s):   [name] Over the next 30 days, patient will demonstrate improved medication adherence as evidenced by verbalized understanding of prescribed medication regimen, assistance available, and patient report of adherence  Interventions: . Perform chart review. Note patient seen by PCP on 5/7 ? PCP advised patient to restart lisinopril 5mg  daily for long-term kidney protection and to return in 2 weeks for labs for review of kidney function . Placed coordination of care call to [facility] ? Note patient receives pill packs from pharmacy, which can impact timing of medication changes ? [name] R[facility] reports lisinopril 5 mg Rx was filled on 5/7 and has been delivered to patient in pill bottle separate from pill pack (planning to add into next pill pack) . Follow up with patient regarding restart of lisinopril 5 mg once daily ? Reports taking lisinopril 5 mg once daily as directed from pill bottle ? Confirms planning to come to office as directed for follow up lab work on 5/21. Confirms transportation for this appointment scheduled. ? Counsel patient on importance of home BP monitoring. Denies having obtained upper arm BP monitor due to transportation barrier ? Patient states that she will call health plan now to order through OTC benefit. ? Receive call back from patient letting me  know that she ordered upper arm BP monitor through [facility] OTC benefit and it should arrive by Saturday. [name] on continued importance of blood sugar control  ? Reports taking metformin ER 500 mg twice daily from pill pack ? Reports last checked fasting CBG on 5/12: 128 . Follow up with patient regarding wound behind her left ear ? Reports applying mupirocin ointment once daily as directed by PCP and that wound is healing well  Patient Self Care Activities:  . Patient takes medications as directed with aid of adherence tools. o Using pill packaging from [facility] . Calls pharmacy for medication refills . Patient to attend scheduled medical appointments . Calls provider office for new concerns or questions  . Patient to check blood sugars and keep log . Patient to check blood pressure daily and keep log  Please see past updates related to this goal by clicking on the "Past Updates" button in the selected goal          Patient verbalizes understanding of instructions provided today.   The care management team will reach out to the patient again over the next 30 days.   [name], PharmD, [place] [employer] [phone]

## 2020-02-05 ENCOUNTER — Ambulatory Visit: Payer: Self-pay | Admitting: Licensed Clinical Social Worker

## 2020-02-05 DIAGNOSIS — I10 Essential (primary) hypertension: Secondary | ICD-10-CM | POA: Diagnosis not present

## 2020-02-05 DIAGNOSIS — J432 Centrilobular emphysema: Secondary | ICD-10-CM | POA: Diagnosis not present

## 2020-02-05 DIAGNOSIS — F324 Major depressive disorder, single episode, in partial remission: Secondary | ICD-10-CM

## 2020-02-05 DIAGNOSIS — E1169 Type 2 diabetes mellitus with other specified complication: Secondary | ICD-10-CM

## 2020-02-05 DIAGNOSIS — Z794 Long term (current) use of insulin: Secondary | ICD-10-CM

## 2020-02-05 DIAGNOSIS — G8929 Other chronic pain: Secondary | ICD-10-CM

## 2020-02-05 DIAGNOSIS — M158 Other polyosteoarthritis: Secondary | ICD-10-CM

## 2020-02-05 DIAGNOSIS — E118 Type 2 diabetes mellitus with unspecified complications: Secondary | ICD-10-CM | POA: Diagnosis not present

## 2020-02-05 NOTE — Chronic Care Management (AMB) (Signed)
Chronic Care Management    Clinical Social Work Follow Up Note  02/05/2020 Name: Kendra [name] MRN: [id number] DOB: [birth year]  Kendra [name] is a 74 y.o. year old female who is a primary care patient of [name], FNP. The CCM team was consulted for assistance with [employer] .   Review of patient status, including review of consultants reports, other relevant assessments, and collaboration with appropriate care team members and the patient's provider was performed as part of comprehensive patient evaluation and provision of chronic care management services.    SDOH (Social Determinants of Health) assessments performed: Yes    Outpatient Encounter Medications as of 02/05/2020  Medication Sig  . albuterol (VENTOLIN HFA) 108 (90 Base) MCG/ACT inhaler INHALE 1-2 PUFFS EVERY 6 HOURS AS NEEDEDSHORTNESS OF BREATH AND WHEEZING  . aspirin 325 MG tablet Take 325 mg by mouth daily.  [name] atorvastatin (LIPITOR) 40 MG tablet TAKE 1 TABLET BY MOUTH ONCE DAILY  . Blood Glucose Monitoring Suppl (ONETOUCH VERIO) w/Device KIT 1 kit by Does not apply route 2 (two) times daily.  [name] buPROPion (WELLBUTRIN XL) 150 MG 24 hr tablet TAKE 1 TABLET BY MOUTH ONCE DAILY  . Cholecalciferol (VITAMIN D3) 50 MCG (2000 UT) capsule Take 1 capsule (2,000 Units total) by mouth daily.  . clopidogrel (PLAVIX) 75 MG tablet Take 1 tablet (75 mg total) by mouth daily. (Patient taking differently: Take 75 mg by mouth daily in the afternoon. )  . diclofenac Sodium (VOLTAREN) 1 % GEL Apply 2 g topically 4 (four) times daily.  [name] glucose blood (ONETOUCH VERIO) test strip Use as instructed  . ibuprofen (ADVIL) 600 MG tablet Take 1 tablet (600 mg total) by mouth every 8 (eight) hours as needed.  [name] Devices (ONE TOUCH DELICA LANCING DEV) MISC 1 Device by Does not apply route 2 (two) times daily.  [name] lisinopril (ZESTRIL) 5 MG tablet Take 1 tablet (5 mg total) by mouth daily.  . metFORMIN (GLUCOPHAGE-XR) 500 MG 24 hr  tablet TAKE 1 TABLET BY MOUTH TWICE DAILY BEFORE A MEAL  . mupirocin ointment (BACTROBAN) 2 % Apply 1 application topically daily.  . nicotine polacrilex (COMMIT) 4 MG lozenge Take 4 mg by mouth as needed for smoking cessation.  . ondansetron (ZOFRAN) 4 MG tablet Take 2 tablets (8 mg total) by mouth every 8 (eight) hours as needed for nausea or vomiting.  [name] Lancets 55D MISC 1 Device by Does not apply route 2 (two) times daily.  . TRELEGY ELLIPTA 100-62.5-25 MCG/INH AEPB INHALE 1 PUFF INTO THE LUNGS ONCE DAILY   No facility-administered encounter medications on file as of 02/05/2020.     Goals Addressed    . SW-I probably could use some extra support right now. (pt-stated)       Current Barriers:  . Financial constraints . Limited social support . ADL IADL limitations . Mental Health Concerns  . Social Isolation . Limited access to caregiver . Inability to perform ADL's independently . Inability to perform IADL's independently . Lacks knowledge of community resource: grief support resources within the area  Clinical Social Work Clinical Goal(s):  [name] Over the next 120 days, patient will work with SW to address concerns related to gaining additional support within the home and resource connection in order to maintain health and independency within the community  . Over the next 120 days, patient will demonstrate improved adherence to self care as evidenced by implementing healthy self-care into her daily routine  such as: attending all medical appointments, taking time for self-reflection, taking medications as prescribed, drinking water and daily exercise to improve mobility.  Interventions: . Patient interviewed and appropriate assessments performed . Education provided on how to implement appropriate self-care and coping tools into her daily routine. Patient was receptive to this education. . Patient reports that she has to relocate soon and has been having issues with her  phone which has halted her housing search. Patient is agreeable to Care Guide Referral. LCSW placed C3 referral on 02/05/20 for housing assistance and navigation.  . Provided patient with information about grief support resources within her area. Patient denies needing this support at this time but was appreciative of education that was provided.  . Discussed plans with patient for ongoing care management follow up and provided patient with direct contact information for care management team . Patient reports that she has been managing her blood pressure very well and is proud of this accomplishment. LCSW provided education on healthy self-care and provide positive reinforcement for taking action to improve her overall health and symptoms.  . Assisted patient/caregiver with obtaining information about health plan benefits . LCSW used active and reflective listening and implemented appropriate interventions to help suppport patient and her emotional needs.  Patient Self Care Activities:  . Attends all scheduled provider appointments . Calls provider office for new concerns or questions  Please see past updates related to this goal by clicking on the "Past Updates" button in the selected goal       Follow Up Plan: SW will follow up with patient by phone over the next quarter  [name], [place], MSW, [email or web address] Phone: [phone]

## 2020-02-06 ENCOUNTER — Other Ambulatory Visit: Payer: Self-pay

## 2020-02-06 ENCOUNTER — Other Ambulatory Visit: Payer: Medicare Other

## 2020-02-07 LAB — COMPLETE METABOLIC PANEL WITH GFR
AG Ratio: 2 (calc) (ref 1.0–2.5)
ALT: 15 U/L (ref 6–29)
AST: 17 U/L (ref 10–35)
Albumin: 4.3 g/dL (ref 3.6–5.1)
Alkaline phosphatase (APISO): 64 U/L (ref 37–153)
BUN: 21 mg/dL (ref 7–25)
CO2: 32 mmol/L (ref 20–32)
Calcium: 10.1 mg/dL (ref 8.6–10.4)
Chloride: 105 mmol/L (ref 98–110)
Creat: 0.72 mg/dL (ref 0.60–0.93)
GFR, Est African American: 98 mL/min/{1.73_m2} (ref >=60)
GFR, Est Non African American: 84 mL/min/{1.73_m2} (ref >=60)
Globulin: 2.2 g/dL (calc) (ref 1.9–3.7)
Glucose, Bld: 110 mg/dL — ABNORMAL HIGH (ref 65–99)
Potassium: 4.7 mmol/L (ref 3.5–5.3)
Sodium: 141 mmol/L (ref 135–146)
Total Bilirubin: 0.3 mg/dL (ref 0.2–1.2)
Total Protein: 6.5 g/dL (ref 6.1–8.1)

## 2020-02-07 LAB — CBC WITH DIFFERENTIAL/PLATELET
Absolute Monocytes: 371 cells/uL (ref 200–950)
Basophils Absolute: 29 cells/uL (ref 0–200)
Basophils Relative: 0.5 %
Eosinophils Absolute: 360 cells/uL (ref 15–500)
Eosinophils Relative: 6.2 %
HCT: 35.7 % (ref 35.0–45.0)
Hemoglobin: 11.6 g/dL — ABNORMAL LOW (ref 11.7–15.5)
Lymphs Abs: 1821 cells/uL (ref 850–3900)
MCH: 30.5 pg (ref 27.0–33.0)
MCHC: 32.5 g/dL (ref 32.0–36.0)
MCV: 93.9 fL (ref 80.0–100.0)
MPV: 9 fL (ref 7.5–12.5)
Monocytes Relative: 6.4 %
Neutro Abs: 3219 cells/uL (ref 1500–7800)
Neutrophils Relative %: 55.5 %
Platelets: 265 10*3/uL (ref 140–400)
RBC: 3.8 10*6/uL (ref 3.80–5.10)
RDW: 12.6 % (ref 11.0–15.0)
Total Lymphocyte: 31.4 %
WBC: 5.8 10*3/uL (ref 3.8–10.8)

## 2020-02-07 LAB — MICROALBUMIN, URINE: Microalb, Ur: 0.7 mg/dL

## 2020-02-07 LAB — LIPID PANEL
Cholesterol: 91 mg/dL (ref <200)
HDL: 54 mg/dL (ref >=50)
LDL Cholesterol (Calc): 21 mg/dL (calc)
Non-HDL Cholesterol (Calc): 37 mg/dL (calc) (ref <130)
Total CHOL/HDL Ratio: 1.7 (calc) (ref <5.0)
Triglycerides: 79 mg/dL (ref <150)

## 2020-02-07 LAB — THYROID PANEL WITH TSH
Free Thyroxine Index: 2.8 (ref 1.4–3.8)
T3 Uptake: 30 % (ref 22–35)
T4, Total: 9.2 ug/dL (ref 5.1–11.9)
TSH: 0.99 mIU/L (ref 0.40–4.50)

## 2020-02-09 ENCOUNTER — Telehealth: Payer: Self-pay | Admitting: General Practice

## 2020-02-09 ENCOUNTER — Telehealth: Payer: Self-pay

## 2020-02-09 ENCOUNTER — Ambulatory Visit: Payer: Self-pay | Admitting: General Practice

## 2020-02-09 DIAGNOSIS — E1159 Type 2 diabetes mellitus with other circulatory complications: Secondary | ICD-10-CM

## 2020-02-09 DIAGNOSIS — F324 Major depressive disorder, single episode, in partial remission: Secondary | ICD-10-CM

## 2020-02-09 DIAGNOSIS — E1169 Type 2 diabetes mellitus with other specified complication: Secondary | ICD-10-CM

## 2020-02-09 DIAGNOSIS — I10 Essential (primary) hypertension: Secondary | ICD-10-CM | POA: Diagnosis not present

## 2020-02-09 DIAGNOSIS — I152 Hypertension secondary to endocrine disorders: Secondary | ICD-10-CM

## 2020-02-09 DIAGNOSIS — Z794 Long term (current) use of insulin: Secondary | ICD-10-CM

## 2020-02-09 DIAGNOSIS — E118 Type 2 diabetes mellitus with unspecified complications: Secondary | ICD-10-CM

## 2020-02-09 DIAGNOSIS — E785 Hyperlipidemia, unspecified: Secondary | ICD-10-CM

## 2020-02-09 DIAGNOSIS — M158 Other polyosteoarthritis: Secondary | ICD-10-CM

## 2020-02-09 DIAGNOSIS — J432 Centrilobular emphysema: Secondary | ICD-10-CM | POA: Diagnosis not present

## 2020-02-09 NOTE — Telephone Encounter (Signed)
Copied from [place] [id number]. Topic: Referral - Status >> Feb 09, 2020  [id number] PM Simone Curia D wrote: [id number] Patient will be picked up at 8:13am per email confirmation from Ginette Otto with [facility] transportation. Left message on voicemal to let the patient know she would be picked up at 8:13am and to return my call if any questions. Ambrose Mantle [phone]

## 2020-02-09 NOTE — Patient Instructions (Signed)
Visit Information  Goals Addressed            This Visit's Progress   . RNCM" Pt-"My sugars are good" (pt-stated)       CARE PLAN ENTRY (see longitudinal plan of care for additional care plan information)  Current Barriers:  [name] Knowledge Deficits related to Diabetes management as evidence of no glucose meter to check blood sugars and the patient verbalizing she was told to take insulin after hospital discharge but the hopsital did not send her home with insulin or a script. The discharge paperwork does state needles for use of Lantus but no guidelines on how many units or frequency.  Leodis Liverpool caregiver support.  . Film/video editor.  . Literacy barriers . Transportation barriers . Difficulty obtaining medications  Nurse Case Manager Clinical Goal(s):  [name] Over the next 120 days, patient will verbalize understanding of plan for diabetes management and control of diabetes.  . Over the next 7 days, patient will work with [facility], Pharmacist and pcp to address needs related to discrepancies in the discharge orders and locating the patients glucose meter that was ordered in March per the patient by [facility]. Completed . Over the next 60 days, patient will demonstrate improved adherence to prescribed treatment plan for diabetes  as evidenced bytaking glucose readings as ordered and following medication regimen for diabetes management.  . Over the next 7 days, patient will work with CM team pharmacist to address issue with no Lantus instructions on the discharge orders and location of new glucose meter. Completed  Interventions:  . Inter-disciplinary care team collaboration (see longitudinal plan of care) . Evaluation of current treatment plan related to diabetes  and patient's adherence to plan as established by provider. . Advised patient to wait for the [facility] or pharmacist to call her to determine how much her insulin dose is supposed to be and instructions by the MD. Confirmed the patient does  not have to take insulin . Provided education to patient re: extensive education on the need to find where the glucose meter is and to take blood sugars especially if she is going to be taking insulin. The patient verbalized understanding of talking with the CCM team before taking insulin. The patient states she does not have any Lantus or other insulin to take. The patient does not have to take insulin and does now have a working glucose meter.  . Reviewed medications with patient and discussed: discovered that there were orders for lancets and syringes to use to administer "Lantus" but no directions on how many units or how often she needed to take. The patient does not require insulin at this time. The patient has talked with the CCM pharmacist and this has been removed from her profile.   [name] Collaborated with pcp by secure chat regarding the missing insulin and problem with the glucose meter. Completed . Reviewed scheduled/upcoming provider appointments including: pcp on 04-27-2020 . Advised patient, providing education and rationale, to check cbg BID and record, calling pcp for findings outside established parameters.  The patient currently does currently have a working meter. This am her blood sugar was 106. . Called [facility] in [place] and talked to [place]. He advised the [facility] that there were not orders for the patient there and had not been in the year 2021. Completed  . Called [name] and spoke to the pharmacist the pharmacist does not have an order for a glucose meter and said that the [place] is likely not covered  by her insurance plan but would be happy to fill an order for one with a script from the pcp. Completed . Questioned the patient about the glucose meter. [facility] nurse told her that her old one was cracked and she needed a new one. The patient verbalized she talked to the "lady at [facility]" in March and it was ordered. She has not gotten the new meter or heard from them.  She could not  provide information concerning the meter. Will collaborate with the CCM team pharmacist on 01/02/2020 to determine if the meter can be located. Completed  . Pharmacy referral for help with clarification of Lantus and help with location of order for new glucose meter. Completed . Review of heart healthy/ADA diet and the patient is being compliant with her dietary restrictions. Praise for accomplishments in regulation of her blood sugars.   Patient Self Care Activities:  . Patient verbalizes understanding of plan to have the CCM team contact her with further instructions concerning the discrepancies with insulin and glucose meter for testing blood sugars.  . Self administers medications as prescribed . Attends all scheduled provider appointments . Performs IADL's independently . Calls provider office for new concerns or questions . Unable to independently manage diabetes  . Unable to self administer medications as prescribed . Unable to perform IADLs independently . Does not contact provider office for questions/concerns  Please see past updates related to this goal by clicking on the "Past Updates" button in the selected goal      . RNCM-Pt "My blood pressures have been stable" (pt-stated)       Current Barriers:  [name] Knowledge Deficits related to basic understanding of hypertension pathophysiology and self care management . Knowledge Deficits related to understanding of medications prescribed for management of hypertension . Non-adherence to prescribed medication regimen . Limited ability to follow a low sodium diet . Film/video editor.  . Transportation barrier  Case Manager Clinical Goal(s):  [name] Over the next 90 days, patient will demonstrate improved adherence to prescribed treatment plan for hypertension as evidenced by taking all medications as prescribed, monitoring and recording blood pressure as directed, adhering to low sodium/DASH diet . Over the next 90 days the patient will  utilize the [employer] with the ability to prepare heart healthy/low sodium diet in home setting . Over the next 30 days the patient will have follow up appointments with pcp and surgeon post right endarterectomy and hospitalization recently- completed  Interventions:  . Evaluation of current treatment plan related to hypertension self management and patient's adherence to plan as established by provider. . Discussed plans with patient for ongoing care management follow up and provided patient with direct contact information for care management team . Advised patient, providing education and rationale, to monitor blood pressure daily and record, the patient stated the pcp told her to go to the ED for SBP>199, see updated vital sign record for readings over the last several days . Advised patient to call [facility] for the "[employer] Benefit" ($55.00) credit per month for buying food  . Verbal order received from [name] for [facility] nurse to continue to come in and assess/evaluate the patient post hospitalization for elevated blood pressure and other healthcare needs- completed . Patient continues to check b/p BID and is following pharmacy recommendations to recheck after 40mins if it is high. The patient is keeping a log and can tell the RNCM her readings.  This am her blood pressure was 101/59. [name]  Evaluated heart healthy diet with the patient. The patient verbalized she will be getting 15 meals a week that are low sodium and this switch will take place at the end of the week.  . Care guide referral for assistance and help with transportation needs related to a podiatry appointment on 02/10/2020.  The patient waited to late to call and get an appointment. Education on keeping a calendar with appointments so she can call in plenty of time to schedule transportation. Explained her appointment with podiatry may need to be rescheduled.  [name] Post discharge assessment for discharge on  12/31/2019 post left endarterectomy.  The patient states she is doing well. Could tell the RNCM if she had swelling, drainage or redness to call the doctor.  The patient denies any pain but has pain medications to take as directed for pain. The patient will follow up with Dr. Donzetta Matters on 01-23-2020.  Post follow up with pcp on 01-19-2020. Completed. . Evaluation of heart healthy/ADA diet. The patient states that she is doing well with her diet. Praised for positive changes in health and well being.   BP Readings from Last 3 Encounters:  01/29/20 119/61  01/23/20 126/80  01/15/20 135/90    Today's reading is 101/59.        Patient Self Care Activities:  . UNABLE to independently manage worsening headaches . Checks BP and records as discussed  Please see past updates related to this goal by clicking on the "Past Updates" button in the selected goal       . RNCM: pt-"I feel like finding a new place to live will help me with my health" (pt-stated)       [facility] (see longtitudinal plan of care for additional care plan information)  Current Barriers:  . Chronic Disease Management support, education, and care coordination needs related to HLD and Depression  Clinical Goal(s) related to HLD and Depression:  Over the next 120 days, patient will:  . Work with the care management team to address educational, disease management, and care coordination needs  . Begin or continue self health monitoring activities as directed today  adhere to heart healthy/ADA diet . Call provider office for new or worsened signs and symptoms New or worsened symptom related to HLD/Depression and other chronic conditions . Call care management team with questions or concerns . Verbalize basic understanding of patient centered plan of care established today  Interventions related to HLD and Depression:  . Evaluation of current treatment plans and patient's adherence to plan as established by provider . Assessed  patient understanding of disease states . Assessed patient's education and care coordination needs . Provided disease specific education to patient  . Collaborated with appropriate clinical care team members regarding patient needs . Assessed housing situation. The patient feels she needs to find a new place to live.  She feels safe in her current environment but stays to herself. When talking to the LCSW last week she expressed wanting help with finding a new place to live. A care guide referral has been placed to help with finding new housing.   Patient Self Care Activities related to HLD and Depression:  . Patient is unable to independently self-manage chronic health conditions  Initial goal documentation        Patient verbalizes understanding of instructions provided today.   The care management team will reach out to the patient again over the next 60 days.   [name] RN, MSN, [place]  [facility] Mobile: [phone]

## 2020-02-09 NOTE — Chronic Care Management (AMB) (Signed)
Chronic Care Management   Follow Up Note   02/09/2020 Name: PIPER HASSEBROCK MRN: [id number] DOB: January 02, 1946  Referred by: [name], FNP Reason for referral : Chronic Care Management (Follow up: RNCM Chronic Disease Management and Care Coordination Needs)   RINDA ROLLYSON is a 74 y.o. year old female who is a primary care patient of [name], [place]. The CCM team was consulted for assistance with chronic disease management and care coordination needs.    Review of patient status, including review of consultants reports, relevant laboratory and other test results, and collaboration with appropriate care team members and the patient's provider was performed as part of comprehensive patient evaluation and provision of chronic care management services.    SDOH (Social Determinants of Health) assessments performed: Yes- needs new housing  See Care Plan activities for detailed interventions related to [facility])     Outpatient Encounter Medications as of 02/09/2020  Medication Sig  . albuterol (VENTOLIN HFA) 108 (90 Base) MCG/ACT inhaler INHALE 1-2 PUFFS EVERY 6 HOURS AS NEEDEDSHORTNESS OF BREATH AND WHEEZING  . aspirin 325 MG tablet Take 325 mg by mouth daily.  [name] atorvastatin (LIPITOR) 40 MG tablet TAKE 1 TABLET BY MOUTH ONCE DAILY  . Blood Glucose Monitoring Suppl (ONETOUCH VERIO) w/Device KIT 1 kit by Does not apply route 2 (two) times daily.  [name] buPROPion (WELLBUTRIN XL) 150 MG 24 hr tablet TAKE 1 TABLET BY MOUTH ONCE DAILY  . Cholecalciferol (VITAMIN D3) 50 MCG (2000 UT) capsule Take 1 capsule (2,000 Units total) by mouth daily.  . clopidogrel (PLAVIX) 75 MG tablet Take 1 tablet (75 mg total) by mouth daily. (Patient taking differently: Take 75 mg by mouth daily in the afternoon. )  . diclofenac Sodium (VOLTAREN) 1 % GEL Apply 2 g topically 4 (four) times daily.  [name] glucose blood (ONETOUCH VERIO) test strip Use as instructed  . ibuprofen (ADVIL) 600 MG tablet Take 1 tablet (600 mg  total) by mouth every 8 (eight) hours as needed.  [name] Devices (ONE TOUCH DELICA LANCING DEV) MISC 1 Device by Does not apply route 2 (two) times daily.  [name] lisinopril (ZESTRIL) 5 MG tablet Take 1 tablet (5 mg total) by mouth daily.  . metFORMIN (GLUCOPHAGE-XR) 500 MG 24 hr tablet TAKE 1 TABLET BY MOUTH TWICE DAILY BEFORE A MEAL  . mupirocin ointment (BACTROBAN) 2 % Apply 1 application topically daily.  . nicotine polacrilex (COMMIT) 4 MG lozenge Take 4 mg by mouth as needed for smoking cessation.  . ondansetron (ZOFRAN) 4 MG tablet Take 2 tablets (8 mg total) by mouth every 8 (eight) hours as needed for nausea or vomiting.  [name] Lancets 40J MISC 1 Device by Does not apply route 2 (two) times daily.  . TRELEGY ELLIPTA 100-62.5-25 MCG/INH AEPB INHALE 1 PUFF INTO THE LUNGS ONCE DAILY   No facility-administered encounter medications on file as of 02/09/2020.     Objective:   Goals Addressed            This Visit's Progress   . RNCM" Pt-"My sugars are good" (pt-stated)       CARE PLAN ENTRY (see longitudinal plan of care for additional care plan information)  Current Barriers:  [name] Knowledge Deficits related to Diabetes management as evidence of no glucose meter to check blood sugars and the patient verbalizing she was told to take insulin after hospital discharge but the hopsital did not send her home with insulin or a script. The  discharge paperwork does state needles for use of Lantus but no guidelines on how many units or frequency.  Leodis Liverpool caregiver support.  . Film/video editor.  . Literacy barriers . Transportation barriers . Difficulty obtaining medications  Nurse Case Manager Clinical Goal(s):  [name] Over the next 120 days, patient will verbalize understanding of plan for diabetes management and control of diabetes.  . Over the next 7 days, patient will work with [facility], Pharmacist and pcp to address needs related to discrepancies in the discharge orders and  locating the patients glucose meter that was ordered in March per the patient by [facility]. Completed . Over the next 60 days, patient will demonstrate improved adherence to prescribed treatment plan for diabetes  as evidenced bytaking glucose readings as ordered and following medication regimen for diabetes management.  . Over the next 7 days, patient will work with CM team pharmacist to address issue with no Lantus instructions on the discharge orders and location of new glucose meter. Completed  Interventions:  . Inter-disciplinary care team collaboration (see longitudinal plan of care) . Evaluation of current treatment plan related to diabetes  and patient's adherence to plan as established by provider. . Advised patient to wait for the [facility] or pharmacist to call her to determine how much her insulin dose is supposed to be and instructions by the MD. Confirmed the patient does not have to take insulin . Provided education to patient re: extensive education on the need to find where the glucose meter is and to take blood sugars especially if she is going to be taking insulin. The patient verbalized understanding of talking with the CCM team before taking insulin. The patient states she does not have any Lantus or other insulin to take. The patient does not have to take insulin and does now have a working glucose meter.  . Reviewed medications with patient and discussed: discovered that there were orders for lancets and syringes to use to administer "Lantus" but no directions on how many units or how often she needed to take. The patient does not require insulin at this time. The patient has talked with the CCM pharmacist and this has been removed from her profile.   [name] Collaborated with pcp by secure chat regarding the missing insulin and problem with the glucose meter. Completed . Reviewed scheduled/upcoming provider appointments including: pcp on 04-27-2020 . Advised patient, providing education and  rationale, to check cbg BID and record, calling pcp for findings outside established parameters.  The patient currently does currently have a working meter. This am her blood sugar was 106. . [facility] [place] [facility] [place]. He advised the [facility] that there were not orders for the patient there and had not been in the year 2021. Completed  . Called [name] and spoke to the pharmacist the pharmacist does not have an order for a glucose meter and said that the Verio is likely not covered by her insurance plan but would be happy to fill an order for one with a script from the pcp. Completed . Questioned the patient about the glucose meter. The [facility] nurse told her that her old one was cracked and she needed a new one. The patient verbalized she talked to the "lady at [facility]" in March and it was ordered. She has not gotten the new meter or heard from them.  She could not provide information concerning the meter. Will collaborate with the CCM team pharmacist on 01/02/2020 to determine if  the meter can be located. Completed  . Pharmacy referral for help with clarification of Lantus and help with location of order for new glucose meter. Completed . Review of heart healthy/ADA diet and the patient is being compliant with her dietary restrictions. Praise for accomplishments in regulation of her blood sugars.   Patient Self Care Activities:  . Patient verbalizes understanding of plan to have the CCM team contact her with further instructions concerning the discrepancies with insulin and glucose meter for testing blood sugars.  . Self administers medications as prescribed . Attends all scheduled provider appointments . Performs IADL's independently . Calls provider office for new concerns or questions . Unable to independently manage diabetes  . Unable to self administer medications as prescribed . Unable to perform IADLs independently . Does not contact provider office for  questions/concerns  Please see past updates related to this goal by clicking on the "Past Updates" button in the selected goal      . RNCM-Pt "My blood pressures have been stable" (pt-stated)       Current Barriers:  [name] Knowledge Deficits related to basic understanding of hypertension pathophysiology and self care management . Knowledge Deficits related to understanding of medications prescribed for management of hypertension . Non-adherence to prescribed medication regimen . Limited ability to follow a low sodium diet . Film/video editor.  . Transportation barrier  Case Manager Clinical Goal(s):  [name] Over the next 90 days, patient will demonstrate improved adherence to prescribed treatment plan for hypertension as evidenced by taking all medications as prescribed, monitoring and recording blood pressure as directed, adhering to low sodium/DASH diet . Over the next 90 days the patient will utilize the [employer] Benefit with the ability to prepare heart healthy/low sodium diet in home setting . Over the next 30 days the patient will have follow up appointments with pcp and surgeon post right endarterectomy and hospitalization recently- completed  Interventions:  . Evaluation of current treatment plan related to hypertension self management and patient's adherence to plan as established by provider. . Discussed plans with patient for ongoing care management follow up and provided patient with direct contact information for care management team . Advised patient, providing education and rationale, to monitor blood pressure daily and record, the patient stated the pcp told her to go to the ED for SBP>199, see updated vital sign record for readings over the last several days . Advised patient to call [facility] for the "Healthy Foods Benefit" ($55.00) credit per month for buying food  . Verbal order received from [name] for [facility] nurse to continue to come in and assess/evaluate  the patient post hospitalization for elevated blood pressure and other healthcare needs- completed . Patient continues to check b/p BID and is following pharmacy recommendations to recheck after 51mns if it is high. The patient is keeping a log and can tell the RNCM her readings.  This am her blood pressure was 101/59. .[name] KitchenEvaluated heart healthy diet with the patient. The patient verbalized she will be getting 15 meals a week that are low sodium and this switch will take place at the end of the week.  . Care guide referral for assistance and help with transportation needs related to a podiatry appointment on 02/10/2020.  The patient waited to late to call and get an appointment. Education on keeping a calendar with appointments so she can call in plenty of time to schedule transportation. Explained her appointment with podiatry may need to be rescheduled.  .[name]  Post discharge assessment for discharge on 12/31/2019 post left endarterectomy.  The patient states she is doing well. Could tell the RNCM if she had swelling, drainage or redness to call the doctor.  The patient denies any pain but has pain medications to take as directed for pain. The patient will follow up with Dr. Donzetta Matters on 01-23-2020.  Post follow up with pcp on 01-19-2020. Completed. . Evaluation of heart healthy/ADA diet. The patient states that she is doing well with her diet. Praised for positive changes in health and well being.   BP Readings from Last 3 Encounters:  01/29/20 119/61  01/23/20 126/80  01/15/20 135/90    Today's reading is 101/59.        Patient Self Care Activities:  . UNABLE to independently manage worsening headaches . Checks BP and records as discussed  Please see past updates related to this goal by clicking on the "Past Updates" button in the selected goal       . RNCM: pt-"I feel like finding a new place to live will help me with my health" (pt-stated)       [place] (see longtitudinal plan of care for  additional care plan information)  Current Barriers:  . Chronic Disease Management support, education, and care coordination needs related to HLD and Depression  Clinical Goal(s) related to HLD and Depression:  Over the next 120 days, patient will:  . Work with the care management team to address educational, disease management, and care coordination needs  . Begin or continue self health monitoring activities as directed today  adhere to heart healthy/ADA diet . Call provider office for new or worsened signs and symptoms New or worsened symptom related to HLD/Depression and other chronic conditions . Call care management team with questions or concerns . Verbalize basic understanding of patient centered plan of care established today  Interventions related to HLD and Depression:  . Evaluation of current treatment plans and patient's adherence to plan as established by provider . Assessed patient understanding of disease states . Assessed patient's education and care coordination needs . Provided disease specific education to patient  . Collaborated with appropriate clinical care team members regarding patient needs . Assessed housing situation. The patient feels she needs to find a new place to live.  She feels safe in her current environment but stays to herself. When talking to the LCSW last week she expressed wanting help with finding a new place to live. A care guide referral has been placed to help with finding new housing.   Patient Self Care Activities related to HLD and Depression:  . Patient is unable to independently self-manage chronic health conditions  Initial goal documentation         Plan:   The care management team will reach out to the patient again over the next 60 days.    [name] RN, MSN, [facility] Mobile: [phone]

## 2020-02-09 NOTE — Telephone Encounter (Signed)
Copied from [place] [id number]. Topic: Referral - Status >> Feb 09, 2020  [id number] PM Simone Curia D wrote: [id number] Emailed transportation request to [employer] and confirmed receipt with Drucie Ip. Called patient to confirm if she used any walking aids, she stated she did not. Told patient I would call to let her know if [facility] could provide transportation to her appt on 02/10/20. Left message on voicemail for patient to return my call regarding [facility] transportation and Medicaid and [facility] Medicare transportation for future appointments. Ambrose Mantle [phone]

## 2020-02-10 ENCOUNTER — Ambulatory Visit: Payer: Medicare Other | Admitting: Podiatry

## 2020-02-12 ENCOUNTER — Ambulatory Visit: Payer: Self-pay | Admitting: Licensed Clinical Social Worker

## 2020-02-12 ENCOUNTER — Telehealth: Payer: Self-pay

## 2020-02-12 NOTE — Telephone Encounter (Signed)
Copied from [place] [id number]. Topic: Referral - Status >> Feb 12, 2020 [id number] AM Simone Curia D wrote: [id number] Spoke with patient about housing resources and emailed information to [email or web address] with patient's permission.  Ambrose Mantle [phone]

## 2020-02-12 NOTE — Chronic Care Management (AMB) (Signed)
  Care Management   Follow Up Note   02/12/2020 Name: LYNNITA LAROE MRN: [id number] DOB: January 02, 1946  Referred by: [name], FNP Reason for referral : Care Coordination   [place] is a 74 y.o. year old female who is a primary care patient of [name], FNP. The care management team was consulted for assistance with care management and care coordination needs.    Review of patient status, including review of consultants reports, relevant laboratory and other test results, and collaboration with appropriate care team members and the patient's provider was performed as part of comprehensive patient evaluation and provision of chronic care management services.    LCSW completed CCM outreach attempt today but was unable to reach patient successfully. A HIPPA compliant voice message was left encouraging patient to return call once available. LCSW rescheduled CCM SW appointment as well.  A HIPPA compliant phone message was left for the patient providing contact information and requesting a return call.    [name], BSW, MSW, [email or web address] Phone: [phone]

## 2020-02-13 ENCOUNTER — Telehealth: Payer: Self-pay

## 2020-02-13 ENCOUNTER — Other Ambulatory Visit: Payer: Self-pay | Admitting: Family Medicine

## 2020-02-13 DIAGNOSIS — Z8673 Personal history of transient ischemic attack (TIA), and cerebral infarction without residual deficits: Secondary | ICD-10-CM

## 2020-02-13 NOTE — Telephone Encounter (Signed)
Copied from [place] [phone]. Topic: Referral - Status >> Feb 13, 2020 [id number] AM Simone Curia D wrote: [id number] Left message on voicemail for patient to return my call regarding [facility] housing resources emailed on 02/12/20. Checking to make sure patient has received them. Ambrose Mantle [phone]

## 2020-02-17 ENCOUNTER — Telehealth: Payer: Self-pay

## 2020-02-17 NOTE — Telephone Encounter (Signed)
Pt called complaining of numbness on the left side of her face - she said that it started a couple hours ago.   Advised patient to go to the ER for evaluation as she possibly could be having stroke symptoms. Pt advised understanding and said that she would go.   She does not notice and facial drooping or slurred speech but given the symptoms - did not want them to be overlooked.   York Cerise, CMA

## 2020-02-19 ENCOUNTER — Telehealth: Payer: Self-pay

## 2020-02-19 NOTE — Telephone Encounter (Signed)
Copied from [place] [phone]. Topic: Referral - Status >> Feb 19, 2020  [id number] PM Simone Curia D wrote: [id number] Spoke with patient, she has received the email sent on 5/27 with housing resources and Medicaid and [facility] Medicare transportation.  Her son will assist her with contacting the resources the information was sent to his information per the patient's request.  No other resources are needed at this time. Closing referral. Ambrose Mantle [phone]

## 2020-03-01 ENCOUNTER — Ambulatory Visit (INDEPENDENT_AMBULATORY_CARE_PROVIDER_SITE_OTHER): Payer: Medicare Other | Admitting: Pharmacist

## 2020-03-01 DIAGNOSIS — I10 Essential (primary) hypertension: Secondary | ICD-10-CM | POA: Diagnosis not present

## 2020-03-01 DIAGNOSIS — Z794 Long term (current) use of insulin: Secondary | ICD-10-CM | POA: Diagnosis not present

## 2020-03-01 DIAGNOSIS — E1159 Type 2 diabetes mellitus with other circulatory complications: Secondary | ICD-10-CM | POA: Diagnosis not present

## 2020-03-01 DIAGNOSIS — I152 Hypertension secondary to endocrine disorders: Secondary | ICD-10-CM

## 2020-03-01 DIAGNOSIS — E118 Type 2 diabetes mellitus with unspecified complications: Secondary | ICD-10-CM | POA: Diagnosis not present

## 2020-03-01 NOTE — Chronic Care Management (AMB) (Signed)
Chronic Care Management   Follow Up Note   03/01/2020 Name: Kendra [name] MRN: [id number] DOB: 12/24/1945  Referred by: Kendra Bangs, FNP Reason for referral : Chronic Care Management (Patient Phone Call)   Kendra [name] is a 74 y.o. year old female who is a primary care patient of Kendra [name], [place]. The CCM team was consulted for assistance with chronic disease management and care coordination needs.    I reached out to [name] by phone today.   Review of patient status, including review of consultants reports, relevant laboratory and other test results, and collaboration with appropriate care team members and the patient's provider was performed as part of comprehensive patient evaluation and provision of chronic care management services.    SDOH (Social Determinants of Health) screening performed today: Tobacco Use. See Care Plan for related entries.   Outpatient Encounter Medications as of 03/01/2020  Medication Sig  . lisinopril (ZESTRIL) 5 MG tablet Take 1 tablet (5 mg total) by mouth daily.  . metFORMIN (GLUCOPHAGE-XR) 500 MG 24 hr tablet TAKE 1 TABLET BY MOUTH TWICE DAILY BEFORE A MEAL  . albuterol (VENTOLIN HFA) 108 (90 Base) MCG/ACT inhaler INHALE 1-2 PUFFS EVERY 6 HOURS AS NEEDEDSHORTNESS OF BREATH AND WHEEZING  . aspirin 325 MG tablet Take 325 mg by mouth daily.  [name] atorvastatin (LIPITOR) 40 MG tablet TAKE 1 TABLET BY MOUTH ONCE DAILY  . Blood Glucose Monitoring Suppl (ONETOUCH VERIO) w/Device KIT 1 kit by Does not apply route 2 (two) times daily.  [name] buPROPion (WELLBUTRIN XL) 150 MG 24 hr tablet TAKE 1 TABLET BY MOUTH ONCE DAILY  . Cholecalciferol (VITAMIN D3) 50 MCG (2000 UT) capsule Take 1 capsule (2,000 Units total) by mouth daily.  . clopidogrel (PLAVIX) 75 MG tablet TAKE 1 TABLET BY MOUTH ONCE DAILY  . diclofenac Sodium (VOLTAREN) 1 % GEL Apply 2 g topically 4 (four) times daily.  [name] glucose blood (ONETOUCH VERIO) test strip Use as instructed  .  ibuprofen (ADVIL) 600 MG tablet Take 1 tablet (600 mg total) by mouth every 8 (eight) hours as needed.  [name] Devices (ONE TOUCH DELICA LANCING DEV) MISC 1 Device by Does not apply route 2 (two) times daily.  . mupirocin ointment (BACTROBAN) 2 % Apply 1 application topically daily.  . nicotine polacrilex (COMMIT) 4 MG lozenge Take 4 mg by mouth as needed for smoking cessation.  . ondansetron (ZOFRAN) 4 MG tablet Take 2 tablets (8 mg total) by mouth every 8 (eight) hours as needed for nausea or vomiting.  Kendra [name] Delica Lancets 93Y MISC 1 Device by Does not apply route 2 (two) times daily.  . TRELEGY ELLIPTA 100-62.5-25 MCG/INH AEPB INHALE 1 PUFF INTO THE LUNGS ONCE DAILY   No facility-administered encounter medications on file as of 03/01/2020.    Goals Addressed            This Visit's Progress   . PharmD-Medication Adherence       Current Barriers:  . Financial Barriers . Knowledge deficits related to coordination of her own care . Limited social support   Pharmacist Clinical Goal(s):   [name] Over the next 30 days, patient will demonstrate improved medication adherence as evidenced by verbalized understanding of prescribed medication regimen, assistance available, and patient report of adherence  Interventions: . Patient states that she is often having headaches again . From review of chart, ? Note patient spoke with Kendra [name] Neurologist about headaches on 04/18/19 and was advised:  "  1) informed there that taking medications such as Tylenol, Meloxicam, ibuprofen, and Aleve more than 2-3 times a week can cause a rebound headache.  2) She was advised to stay well hydrated and drink plenty of water. 3) She was encouraged to try and get restful sleep and reduce her stress.  4) To help with head and neck pain, I suggested she could try applying a warm compress on the right side of her neck for 10-15 minutes and then apply a topical analgesic such as Biofreeze on the painful area. She could  do this 2-3 times a day.  5) She was advised to reduce or stop cigarette smoking." ? On 8/19, patient also referred to Kendra [name] general Neurology for further evaluation of her frequent headaches. Patient denies following up regarding this referral . Encourage patient to follow up with [name] as originally referred by [facility] Neurologist for further evaluation of her headaches and encourage patient to follow advice as originally given by Neurologist about headache management ? [name] admits to having been taking acetaminophen >3 times/week, taking acetaminophen 325 mg - 2 tablets (650 mg) every night at bedtime ? [name] states that she would prefer to follow up with her PCP instead. [name] Counsel patient on importance of blood pressure monitoring ? Reports taking lisinopril 5 mg once daily now from pill pack ? Confirms obtained upper arm BP monitor through OTC benefit and has been checking home BP ? 6/14: 112/80, HR 72 ? 6/13: 136/87, HR 81 ? 6/12: 119/87, HR 86 ? 6/11: 117/76, HR 75 . Counsel on continued importance of blood sugar control  ? Reports taking metformin ER 500 mg twice daily from pill pack ? Reports CBGs ranging: 110s-140s ? Discuss importance of having regular and well-balanced meals . Counsel on importance of smoking cessation o Reports has decreased to: ~1 cigarette/2 days  States "I take 2 pulls and put it out" a couple of times during the day o Using nicotine lozenges as needed as directed on package as cessation aid o Have discussed strategies to help patient to manage her stress/manage craving  Patient continues to "keep busy" around house and walk to keep herself distracted when having cravings . Counsel patient on COVID-19 vaccination and locations where vaccination is available.  o [facility] Provide patient with phone number to schedule  Patient Self Care Activities:  . Patient takes medications as directed  with aid of adherence tools. o Using pill packaging from [facility] . Calls pharmacy for medication refills . Patient to attend scheduled medical appointments . Calls provider office for new concerns or questions  . Patient to check blood sugars and keep log . Patient to check blood pressure daily and keep log  Please see past updates related to this goal by clicking on the "Past Updates" button in the selected goal          Plan  The care management team will reach out to the patient again over the next 30 days.   [name], PharmD, [place] [employer] [phone]

## 2020-03-01 NOTE — Patient Instructions (Signed)
Thank you allowing the Chronic Care Management Team to be a part of your care! It was a pleasure speaking with you today!     CCM (Chronic Care Management) Team    [name] RN, MSN, CCM Nurse Care Coordinator  [phone]   [name] PharmD  Clinical Pharmacist  [phone]   [name] LCSW Clinical Social Worker [phone]  Visit Information  Goals Addressed            This Visit's Progress   . PharmD-Medication Adherence       Current Barriers:  . Financial Barriers . Knowledge deficits related to coordination of her own care . Limited social support   Pharmacist Clinical Goal(s):   [name] Over the next 30 days, patient will demonstrate improved medication adherence as evidenced by verbalized understanding of prescribed medication regimen, assistance available, and patient report of adherence  Interventions: . Patient states that she is often having headaches again . From review of chart, ? Note patient spoke with [facility] Neurologist about headaches on 04/18/19 and was advised:  "1) informed there that taking medications such as Tylenol, Meloxicam, ibuprofen, and Aleve more than 2-3 times a week can cause a rebound headache.  2) She was advised to stay well hydrated and drink plenty of water. 3) She was encouraged to try and get restful sleep and reduce her stress.  4) To help with head and neck pain, I suggested she could try applying a warm compress on the right side of her neck for 10-15 minutes and then apply a topical analgesic such as Biofreeze on the painful area. She could do this 2-3 times a day.  5) She was advised to reduce or stop cigarette smoking." ? On 8/19, patient also referred to [name] general Neurology for further evaluation of her frequent headaches. Patient denies following up regarding this referral . Encourage patient to follow up with [facility] Neurology as originally referred by [facility] Neurologist for further evaluation of her headaches and  encourage patient to follow advice as originally given by Neurologist about headache management ? Ms. Bradham admits to having been taking acetaminophen >3 times/week, taking acetaminophen 325 mg - 2 tablets (650 mg) every night at bedtime ? Ms. Gouge states that she would prefer to follow up with her PCP instead. [name] Counsel patient on importance of blood pressure monitoring ? Reports taking lisinopril 5 mg once daily now from pill pack ? Confirms obtained upper arm BP monitor through OTC benefit and has been checking home BP ? 6/14: 112/80, HR 72 ? 6/13: 136/87, HR 81 ? 6/12: 119/87, HR 86 ? 6/11: 117/76, HR 75 . Counsel on continued importance of blood sugar control  ? Reports taking metformin ER 500 mg twice daily from pill pack ? Reports CBGs ranging: 110s-140s ? Discuss importance of having regular and well-balanced meals . Counsel on importance of smoking cessation o Reports has decreased to: ~1 cigarette/2 days  States "I take 2 pulls and put it out" a couple of times during the day o Using nicotine lozenges as needed as directed on package as cessation aid o Have discussed strategies to help patient to manage her stress/manage craving  Patient continues to "keep busy" around house and walk to keep herself distracted when having cravings . Counsel patient on COVID-19 vaccination and locations where vaccination is available.  o [facility]. Provide patient with phone number to schedule  Patient Self Care Activities:  . Patient takes  medications as directed with aid of adherence tools. o Using pill packaging from [facility] . Calls pharmacy for medication refills . Patient to attend scheduled medical appointments . Calls provider office for new concerns or questions  . Patient to check blood sugars and keep log . Patient to check blood pressure daily and keep log  Please see past updates related to this goal by clicking on the "Past  Updates" button in the selected goal          Patient verbalizes understanding of instructions provided today.   The care management team will reach out to the patient again over the next 30 days.   [name], PharmD, [place] [employer] [phone]

## 2020-03-11 ENCOUNTER — Other Ambulatory Visit: Payer: Self-pay | Admitting: Family Medicine

## 2020-03-11 ENCOUNTER — Telehealth: Payer: Self-pay

## 2020-03-11 DIAGNOSIS — M79641 Pain in right hand: Secondary | ICD-10-CM

## 2020-03-11 DIAGNOSIS — M19041 Primary osteoarthritis, right hand: Secondary | ICD-10-CM

## 2020-03-11 NOTE — Telephone Encounter (Signed)
The pt was notified of recommendation. She verbalize understanding, no questions or concerns.

## 2020-03-11 NOTE — Telephone Encounter (Signed)
Copied from [place] [id number]. Topic: Referral - Status >> Mar 11, 2020 10:27 AM Yvette Rack wrote: Reason for CRM: NP, Felix Ahmadi with [facility] stated the cream that patient was prescribed is not working and patient would like to request a referral to a Rheumatologist or a hand specialist as previously discussed with Cyndia Skeeters.

## 2020-03-11 NOTE — Telephone Encounter (Signed)
I will put in a referral to Orthopedics.  We would need abnormal rheumatoid labs for referral to rheumatology.  She should have a call from Orthopedics within 1 week, if not to please contact us.  [facility] is at [address] in [place], with urgent care hours until 7:30pm, should she not want to wait for a call to schedule an appointment.  Thanks

## 2020-03-11 NOTE — Progress Notes (Signed)
Patient requested referral to orthopedics or rheumatology for continued right hand pain that is unresponsive to diclofenac gel.

## 2020-03-16 ENCOUNTER — Other Ambulatory Visit: Payer: Self-pay | Admitting: Family Medicine

## 2020-03-16 DIAGNOSIS — Z8673 Personal history of transient ischemic attack (TIA), and cerebral infarction without residual deficits: Secondary | ICD-10-CM

## 2020-03-16 DIAGNOSIS — J449 Chronic obstructive pulmonary disease, unspecified: Secondary | ICD-10-CM

## 2020-03-16 NOTE — Telephone Encounter (Signed)
Requested medication (s) are due for refill today: yes  Requested medication (s) are on the active medication list:n o  Last refill:  02/12/18  Future visit scheduled: yes  Notes to clinic: script on file has 108 (90 base)18 g listed    Requested Prescriptions  Pending Prescriptions Disp Refills   albuterol (VENTOLIN HFA) 108 (90 Base) MCG/ACT inhaler [Pharmacy Med Name: ALBUTEROL SULFATE HFA 108 (90 BASE)] 8.5 g     Sig: INHALE 1-2 PUFFS EVERY 6 HOURS AS NEEDEDSHORTNESS OF BREATH AND WHEEZING      Pulmonology:  Beta Agonists Failed - 03/16/2020  2:07 PM      Failed - One inhaler should last at least one month. If the patient is requesting refills earlier, contact the patient to check for uncontrolled symptoms.      Passed - Valid encounter within last 12 months    Recent Outpatient Visits           1 month ago Right hand pain   [name] [place], [name], FNP   1 month ago Controlled type 2 diabetes mellitus with complication, with long-term current use of insulin [facility])   [place], FNP   1 month ago Ear pain, left   [place], FNP   5 months ago Essential hypertension   [place], DO   6 months ago Controlled type 2 diabetes mellitus with complication, with long-term current use of insulin [facility])   [facility] Olin Hauser, DO       Future Appointments             In 1 month [place], [name], [facility], [facility]

## 2020-03-29 ENCOUNTER — Telehealth: Payer: Self-pay | Admitting: General Practice

## 2020-03-29 ENCOUNTER — Ambulatory Visit (INDEPENDENT_AMBULATORY_CARE_PROVIDER_SITE_OTHER): Payer: Medicare Other | Admitting: General Practice

## 2020-03-29 DIAGNOSIS — F17200 Nicotine dependence, unspecified, uncomplicated: Secondary | ICD-10-CM

## 2020-03-29 DIAGNOSIS — E1159 Type 2 diabetes mellitus with other circulatory complications: Secondary | ICD-10-CM

## 2020-03-29 DIAGNOSIS — E118 Type 2 diabetes mellitus with unspecified complications: Secondary | ICD-10-CM

## 2020-03-29 DIAGNOSIS — F324 Major depressive disorder, single episode, in partial remission: Secondary | ICD-10-CM | POA: Diagnosis not present

## 2020-03-29 DIAGNOSIS — E785 Hyperlipidemia, unspecified: Secondary | ICD-10-CM

## 2020-03-29 DIAGNOSIS — I152 Hypertension secondary to endocrine disorders: Secondary | ICD-10-CM

## 2020-03-29 DIAGNOSIS — E1169 Type 2 diabetes mellitus with other specified complication: Secondary | ICD-10-CM

## 2020-03-29 DIAGNOSIS — I10 Essential (primary) hypertension: Secondary | ICD-10-CM

## 2020-03-29 DIAGNOSIS — Z794 Long term (current) use of insulin: Secondary | ICD-10-CM

## 2020-03-29 NOTE — Chronic Care Management (AMB) (Signed)
Chronic Care Management   Follow Up Note   03/29/2020 Name: Kendra [name] MRN: [id number] DOB: 07-Sep-1946  Referred by: [name], FNP Reason for referral : Chronic Care Management (RNCM follow up call: Chronic Disease Management and Care Coordination Needs )   Kendra [name] is a 74 y.o. year old female who is a primary care patient of [name], FNP. The CCM team was consulted for assistance with chronic disease management and care coordination needs.    Review of patient status, including review of consultants reports, relevant laboratory and other test results, and collaboration with appropriate care team members and the patient's provider was performed as part of comprehensive patient evaluation and provision of chronic care management services.    SDOH (Social Determinants of Health) assessments performed: Yes See Care Plan activities for detailed interventions related to [facility])     Outpatient Encounter Medications as of 03/29/2020  Medication Sig   albuterol (VENTOLIN HFA) 108 (90 Base) MCG/ACT inhaler INHALE 1-2 PUFFS EVERY 6 HOURS AS NEEDEDSHORTNESS OF BREATH AND WHEEZING   aspirin 325 MG tablet Take 325 mg by mouth daily.   atorvastatin (LIPITOR) 40 MG tablet TAKE 1 TABLET BY MOUTH ONCE DAILY   Blood Glucose Monitoring Suppl (ONETOUCH VERIO) w/Device KIT 1 kit by Does not apply route 2 (two) times daily.   buPROPion (WELLBUTRIN XL) 150 MG 24 hr tablet TAKE 1 TABLET BY MOUTH ONCE DAILY   Cholecalciferol (VITAMIN D3) 50 MCG (2000 UT) capsule Take 1 capsule (2,000 Units total) by mouth daily.   clopidogrel (PLAVIX) 75 MG tablet TAKE 1 TABLET BY MOUTH ONCE DAILY   diclofenac Sodium (VOLTAREN) 1 % GEL Apply 2 g topically 4 (four) times daily.   glucose blood (ONETOUCH VERIO) test strip Use as instructed   ibuprofen (ADVIL) 600 MG tablet Take 1 tablet (600 mg total) by mouth every 8 (eight) hours as needed.   Lancet Devices (ONE TOUCH DELICA LANCING DEV)  MISC 1 Device by Does not apply route 2 (two) times daily.   lisinopril (ZESTRIL) 5 MG tablet Take 1 tablet (5 mg total) by mouth daily.   metFORMIN (GLUCOPHAGE-XR) 500 MG 24 hr tablet TAKE 1 TABLET BY MOUTH TWICE DAILY BEFORE A MEAL   mupirocin ointment (BACTROBAN) 2 % Apply 1 application topically daily.   nicotine polacrilex (COMMIT) 4 MG lozenge Take 4 mg by mouth as needed for smoking cessation.   ondansetron (ZOFRAN) 4 MG tablet Take 2 tablets (8 mg total) by mouth every 8 (eight) hours as needed for nausea or vomiting.   OneTouch Delica Lancets 35W MISC 1 Device by Does not apply route 2 (two) times daily.   TRELEGY ELLIPTA 100-62.5-25 MCG/INH AEPB INHALE 1 PUFF INTO THE LUNGS ONCE DAILY   No facility-administered encounter medications on file as of 03/29/2020.     Objective:   Goals Addressed              This Visit's Progress     COMPLETED: RNCM" Pt-"My sugars are good" (pt-stated)        CARE PLAN ENTRY (see longitudinal plan of care for additional care plan information)  Current Barriers: DM is well controlled at this time. Goal completed  Knowledge Deficits related to Diabetes management as evidence of no glucose meter to check blood sugars and the patient verbalizing she was told to take insulin after hospital discharge but the hopsital did not send her home with insulin or a script. The discharge paperwork does  state needles for use of Lantus but no guidelines on how many units or frequency.   Lacks caregiver support.   Film/video editor.   Literacy barriers  Transportation barriers  Difficulty obtaining medications  Nurse Case Manager Clinical Goal(s):   Over the next 120 days, patient will verbalize understanding of plan for diabetes management and control of diabetes.   Over the next 7 days, patient will work with [facility], Pharmacist and pcp to address needs related to discrepancies in the discharge orders and locating the patients glucose meter  that was ordered in March per the patient by [facility]. Completed  Over the next 60 days, patient will demonstrate improved adherence to prescribed treatment plan for diabetes  as evidenced bytaking glucose readings as ordered and following medication regimen for diabetes management.   Over the next 7 days, patient will work with CM team pharmacist to address issue with no Lantus instructions on the discharge orders and location of new glucose meter. Completed  Interventions:   Inter-disciplinary care team collaboration (see longitudinal plan of care)  Evaluation of current treatment plan related to diabetes  and patient's adherence to plan as established by provider.  Advised patient to wait for the [facility] or pharmacist to call her to determine how much her insulin dose is supposed to be and instructions by the MD. Confirmed the patient does not have to take insulin  Provided education to patient re: extensive education on the need to find where the glucose meter is and to take blood sugars especially if she is going to be taking insulin. The patient verbalized understanding of talking with the CCM team before taking insulin. The patient states she does not have any Lantus or other insulin to take. The patient does not have to take insulin and does now have a working glucose meter.   Reviewed medications with patient and discussed: discovered that there were orders for lancets and syringes to use to administer "Lantus" but no directions on how many units or how often she needed to take. The patient does not require insulin at this time. The patient has talked with the CCM pharmacist and this has been removed from her profile.    Collaborated with pcp by secure chat regarding the missing insulin and problem with the glucose meter. Completed  Reviewed scheduled/upcoming provider appointments including: pcp on 04-27-2020  Advised patient, providing education and rationale, to check cbg BID and record,  calling pcp for findings outside established parameters.  The patient currently does currently have a working meter. This am her blood sugar was 106.  [facility] [place] [facility] [place]. He advised the [facility] that there were not orders for the patient there and had not been in the year 2021. Completed   Called [name] and spoke to the pharmacist the pharmacist does not have an order for a glucose meter and said that the Verio is likely not covered by her insurance plan but would be happy to fill an order for one with a script from the pcp. Completed  Questioned the patient about the glucose meter. The [facility] nurse told her that her old one was cracked and she needed a new one. The patient verbalized she talked to the "lady at [facility]" in March and it was ordered. She has not gotten the new meter or heard from them.  She could not provide information concerning the meter. Will collaborate with the CCM team pharmacist on 01/02/2020 to determine if the meter can  be located. Completed   Pharmacy referral for help with clarification of Lantus and help with location of order for new glucose meter. Completed  Review of heart healthy/ADA diet and the patient is being compliant with her dietary restrictions. Praise for accomplishments in regulation of her blood sugars.   Patient Self Care Activities:   Patient verbalizes understanding of plan to have the CCM team contact her with further instructions concerning the discrepancies with insulin and glucose meter for testing blood sugars.   Self administers medications as prescribed  Attends all scheduled provider appointments  Performs IADL's independently  Calls provider office for new concerns or questions  Unable to independently manage diabetes   Unable to self administer medications as prescribed  Unable to perform IADLs independently  Does not contact provider office for questions/concerns  Please see past updates related to this  goal by clicking on the "Past Updates" button in the selected goal        RNCM-Pt "My blood pressures have been stable" (pt-stated)        Current Barriers:   Knowledge Deficits related to basic understanding of hypertension pathophysiology and self care management  Knowledge Deficits related to understanding of medications prescribed for management of hypertension  Non-adherence to prescribed medication regimen  Limited ability to follow a low sodium diet  Financial Constraints.   Transportation barrier  Case Manager Clinical Goal(s):   Over the next 90 days, patient will demonstrate improved adherence to prescribed treatment plan for hypertension as evidenced by taking all medications as prescribed, monitoring and recording blood pressure as directed, adhering to low sodium/DASH diet  Over the next 90 days the patient will utilize the [employer] Benefit with the ability to prepare heart healthy/low sodium diet in home setting  Over the next 30 days the patient will have follow up appointments with pcp and surgeon post right endarterectomy and hospitalization recently- completed  Interventions:   Evaluation of current treatment plan related to hypertension self management and patient's adherence to plan as established by provider. The patient states her headaches have resolved at this time.   Discussed plans with patient for ongoing care management follow up and provided patient with direct contact information for care management team  Advised patient, providing education and rationale, to monitor blood pressure daily and record, the patient stated the pcp told her to go to the ED for SBP>199, see updated vital sign record for readings over the last several days  Advised patient to call [facility] for the "Healthy Foods Benefit" ($55.00) credit per month for buying food   Patient continues to check b/p BID and is following pharmacy recommendations to recheck after 38mns if it is  high. The patient is keeping a log and can tell the RNCM her readings.  This am her blood pressure was 101/59.  03-29-2020: The patient did not provide readings but states her blood pressure have been really good.   Evaluated heart healthy diet with the patient. The patient verbalized she will be getting 15 meals a week that are low sodium and this switch will take place at the end of the week. 03-29-2020: The patient continue to get meals and this is working well for her.   Care guide referral for assistance and help with transportation needs related to a podiatry appointment on 02/10/2020.  The patient waited to late to call and get an appointment. Education on keeping a calendar with appointments so she can call in plenty of time to schedule transportation.  Explained her appointment with podiatry may need to be rescheduled.   Post discharge assessment for discharge on 12/31/2019 post left endarterectomy.  The patient states she is doing well. Could tell the RNCM if she had swelling, drainage or redness to call the doctor.  The patient denies any pain but has pain medications to take as directed for pain. The patient will follow up with Dr. Donzetta Matters on 01-23-2020.  Post follow up with pcp on 01-19-2020. Completed.  Evaluation of heart healthy/ADA diet. The patient states that she is doing well with her diet. Praised for positive changes in health and well being.   BP Readings from Last 3 Encounters:  01/29/20 119/61  01/23/20 126/80  01/15/20 135/90           Patient Self Care Activities:   UNABLE to independently manage worsening headaches  Checks BP and records as discussed  Please see past updates related to this goal by clicking on the "Past Updates" button in the selected goal         RNCM: pt-"I feel like finding a new place to live will help me with my health" (pt-stated)        [place] (see longtitudinal plan of care for additional care plan information)  Current Barriers:    Chronic Disease Management support, education, and care coordination needs related to HLD and Depression  Clinical Goal(s) related to HLD and Depression:  Over the next 120 days, patient will:   Work with the care management team to address educational, disease management, and care coordination needs   Begin or continue self health monitoring activities as directed today  adhere to heart healthy/ADA diet  Call provider office for new or worsened signs and symptoms New or worsened symptom related to HLD/Depression and other chronic conditions  Call care management team with questions or concerns  Verbalize basic understanding of patient centered plan of care established today  Interventions related to HLD and Depression:   Evaluation of current treatment plans and patient's adherence to plan as established by provider.  The patient states she is doing well but still needs a new place to live. She does not want another apartment. She has called the provider due to numbness of her face on the right side. The patient had this after having the right carotid endarterectomy done. She has talked with the provider and they have done xrays but they could not determine any reason why she was having the numbness and swelling. The patient follows up with the pcp on 04-27-2020.   Assessed patient understanding of disease states.  The patient feels she is doing very well at this time. States her blood pressure is stable and no new concerns.  Assessed patient's education and care coordination needs  Provided disease specific education to patient.  Education on smoking cessation and the benefit of smoking cessation on her overall health and well being. The patient states she has cut down considerably. Education and support.   Collaborated with appropriate clinical care team members regarding patient needs  Assessed housing situation. The patient feels she needs to find a new place to live.  She feels  safe in her current environment but stays to herself. When talking to the LCSW last week she expressed wanting help with finding a new place to live. A care guide referral has been placed to help with finding new housing. 03-29-2020: The patient states she did not receive the housing information. Looked at the referral and the  information was sent to her sons email address. The patient ask if the information could be sent to her mailing address. A new referral sent to the care guides to see if a copy of the resources could be mailed to her mailing address. She does not want another apartment.  Education and support given.   Patient Self Care Activities related to HLD and Depression:   Patient is unable to independently self-manage chronic health conditions  Please see past updates related to this goal by clicking on the "Past Updates" button in the selected goal          Plan:   The care management team will reach out to the patient again over the next 60 days.    [name] RN, MSN, [place] Mobile: [phone]

## 2020-03-29 NOTE — Patient Instructions (Signed)
Visit Information  Goals Addressed              This Visit's Progress   .  COMPLETED: RNCM" Pt-"My sugars are good" (pt-stated)        CARE PLAN ENTRY (see longitudinal plan of care for additional care plan information)  Current Barriers: DM is well controlled at this time. Goal completed . Knowledge Deficits related to Diabetes management as evidence of no glucose meter to check blood sugars and the patient verbalizing she was told to take insulin after hospital discharge but the hopsital did not send her home with insulin or a script. The discharge paperwork does state needles for use of Lantus but no guidelines on how many units or frequency.  Leodis [place] caregiver support.  . Film/video editor.  . Literacy barriers . Transportation barriers . Difficulty obtaining medications  Nurse Case Manager Clinical Goal(s):  [name] Over the next 120 days, patient will verbalize understanding of plan for diabetes management and control of diabetes.  . Over the next 7 days, patient will work with [facility], Pharmacist and pcp to address needs related to discrepancies in the discharge orders and locating the patients glucose meter that was ordered in March per the patient by [facility]. Completed . Over the next 60 days, patient will demonstrate improved adherence to prescribed treatment plan for diabetes  as evidenced bytaking glucose readings as ordered and following medication regimen for diabetes management.  . Over the next 7 days, patient will work with CM team pharmacist to address issue with no Lantus instructions on the discharge orders and location of new glucose meter. Completed  Interventions:  . Inter-disciplinary care team collaboration (see longitudinal plan of care) . Evaluation of current treatment plan related to diabetes  and patient's adherence to plan as established by provider. . Advised patient to wait for the [facility] or pharmacist to call her to determine how much her insulin dose is  supposed to be and instructions by the MD. Confirmed the patient does not have to take insulin . Provided education to patient re: extensive education on the need to find where the glucose meter is and to take blood sugars especially if she is going to be taking insulin. The patient verbalized understanding of talking with the CCM team before taking insulin. The patient states she does not have any Lantus or other insulin to take. The patient does not have to take insulin and does now have a working glucose meter.  . Reviewed medications with patient and discussed: discovered that there were orders for lancets and syringes to use to administer "Lantus" but no directions on how many units or how often she needed to take. The patient does not require insulin at this time. The patient has talked with the CCM pharmacist and this has been removed from her profile.   [name] Collaborated with pcp by secure chat regarding the missing insulin and problem with the glucose meter. Completed . Reviewed scheduled/upcoming provider appointments including: pcp on 04-27-2020 . Advised patient, providing education and rationale, to check cbg BID and record, calling pcp for findings outside established parameters.  The patient currently does currently have a working meter. This am her blood sugar was 106. . Called [facility] in [place] and talked to [place]. He advised the [facility] that there were not orders for the patient there and had not been in the year 2021. Completed  . Called [name] and spoke to the pharmacist the pharmacist does not have an order  for a glucose meter and said that the Verio is likely not covered by her insurance plan but would be happy to fill an order for one with a script from the pcp. Completed . Questioned the patient about the glucose meter. The [facility] nurse told her that her old one was cracked and she needed a new one. The patient verbalized she talked to the "lady at [facility]" in March and it was ordered.  She has not gotten the new meter or heard from them.  She could not provide information concerning the meter. Will collaborate with the CCM team pharmacist on 01/02/2020 to determine if the meter can be located. Completed  . Pharmacy referral for help with clarification of Lantus and help with location of order for new glucose meter. Completed . Review of heart healthy/ADA diet and the patient is being compliant with her dietary restrictions. Praise for accomplishments in regulation of her blood sugars.   Patient Self Care Activities:  . Patient verbalizes understanding of plan to have the CCM team contact her with further instructions concerning the discrepancies with insulin and glucose meter for testing blood sugars.  . Self administers medications as prescribed . Attends all scheduled provider appointments . Performs IADL's independently . Calls provider office for new concerns or questions . Unable to independently manage diabetes  . Unable to self administer medications as prescribed . Unable to perform IADLs independently . Does not contact provider office for questions/concerns  Please see past updates related to this goal by clicking on the "Past Updates" button in the selected goal      .  RNCM-Pt "My blood pressures have been stable" (pt-stated)        Current Barriers:  [name] Knowledge Deficits related to basic understanding of hypertension pathophysiology and self care management . Knowledge Deficits related to understanding of medications prescribed for management of hypertension . Non-adherence to prescribed medication regimen . Limited ability to follow a low sodium diet . Film/video editor.  . Transportation barrier  Case Manager Clinical Goal(s):  [name] Over the next 90 days, patient will demonstrate improved adherence to prescribed treatment plan for hypertension as evidenced by taking all medications as prescribed, monitoring and recording blood pressure as directed,  adhering to low sodium/DASH diet . Over the next 90 days the patient will utilize the [employer] Benefit with the ability to prepare heart healthy/low sodium diet in home setting . Over the next 30 days the patient will have follow up appointments with pcp and surgeon post right endarterectomy and hospitalization recently- completed  Interventions:  . Evaluation of current treatment plan related to hypertension self management and patient's adherence to plan as established by provider. The patient states her headaches have resolved at this time.  . Discussed plans with patient for ongoing care management follow up and provided patient with direct contact information for care management team . Advised patient, providing education and rationale, to monitor blood pressure daily and record, the patient stated the pcp told her to go to the ED for SBP>199, see updated vital sign record for readings over the last several days . Advised patient to call [facility] for the "Healthy Foods Benefit" ($55.00) credit per month for buying food  . Patient continues to check b/p BID and is following pharmacy recommendations to recheck after 28mins if it is high. The patient is keeping a log and can tell the RNCM her readings.  This am her blood pressure was 101/59.  03-29-2020: The patient did not  provide readings but states her blood pressure have been really good.  . Evaluated heart healthy diet with the patient. The patient verbalized she will be getting 15 meals a week that are low sodium and this switch will take place at the end of the week. 03-29-2020: The patient continue to get meals and this is working well for her.  . Care guide referral for assistance and help with transportation needs related to a podiatry appointment on 02/10/2020.  The patient waited to late to call and get an appointment. Education on keeping a calendar with appointments so she can call in plenty of time to schedule transportation. Explained her  appointment with podiatry may need to be rescheduled.  [name] Post discharge assessment for discharge on 12/31/2019 post left endarterectomy.  The patient states she is doing well. Could tell the RNCM if she had swelling, drainage or redness to call the doctor.  The patient denies any pain but has pain medications to take as directed for pain. The patient will follow up with Dr. Donzetta Matters on 01-23-2020.  Post follow up with pcp on 01-19-2020. Completed. . Evaluation of heart healthy/ADA diet. The patient states that she is doing well with her diet. Praised for positive changes in health and well being.   BP Readings from Last 3 Encounters:  01/29/20 119/61  01/23/20 126/80  01/15/20 135/90           Patient Self Care Activities:  . UNABLE to independently manage worsening headaches . Checks BP and records as discussed  Please see past updates related to this goal by clicking on the "Past Updates" button in the selected goal       .  RNCM: pt-"I feel like finding a new place to live will help me with my health" (pt-stated)        [place] (see longtitudinal plan of care for additional care plan information)  Current Barriers:  . Chronic Disease Management support, education, and care coordination needs related to HLD and Depression  Clinical Goal(s) related to HLD and Depression:  Over the next 120 days, patient will:  . Work with the care management team to address educational, disease management, and care coordination needs  . Begin or continue self health monitoring activities as directed today  adhere to heart healthy/ADA diet . Call provider office for new or worsened signs and symptoms New or worsened symptom related to HLD/Depression and other chronic conditions . Call care management team with questions or concerns . Verbalize basic understanding of patient centered plan of care established today  Interventions related to HLD and Depression:  . Evaluation of current treatment plans  and patient's adherence to plan as established by provider.  The patient states she is doing well but still needs a new place to live. She does not want another apartment. She has called the provider due to numbness of her face on the right side. The patient had this after having the right carotid endarterectomy done. She has talked with the provider and they have done xrays but they could not determine any reason why she was having the numbness and swelling. The patient follows up with the pcp on 04-27-2020.  . Assessed patient understanding of disease states.  The patient feels she is doing very well at this time. States her blood pressure is stable and no new concerns. . Assessed patient's education and care coordination needs . Provided disease specific education to patient.  Education on smoking cessation and the  benefit of smoking cessation on her overall health and well being. The patient states she has cut down considerably. Education and support.  Nash Dimmer with appropriate clinical care team members regarding patient needs . Assessed housing situation. The patient feels she needs to find a new place to live.  She feels safe in her current environment but stays to herself. When talking to the LCSW last week she expressed wanting help with finding a new place to live. A care guide referral has been placed to help with finding new housing. 03-29-2020: The patient states she did not receive the housing information. Looked at the referral and the information was sent to her sons email address. The patient ask if the information could be sent to her mailing address. A new referral sent to the care guides to see if a copy of the resources could be mailed to her mailing address. She does not want another apartment.  Education and support given.   Patient Self Care Activities related to HLD and Depression:  . Patient is unable to independently self-manage chronic health conditions  Please see past  updates related to this goal by clicking on the "Past Updates" button in the selected goal         Patient verbalizes understanding of instructions provided today.   The care management team will reach out to the patient again over the next 60 days.   [name] RN, MSN, [place] Mobile: [phone]

## 2020-03-30 ENCOUNTER — Other Ambulatory Visit: Payer: Self-pay

## 2020-03-30 ENCOUNTER — Ambulatory Visit (INDEPENDENT_AMBULATORY_CARE_PROVIDER_SITE_OTHER): Payer: Medicare Other | Admitting: Podiatry

## 2020-03-30 DIAGNOSIS — E0843 Diabetes mellitus due to underlying condition with diabetic autonomic (poly)neuropathy: Secondary | ICD-10-CM

## 2020-03-30 DIAGNOSIS — B351 Tinea unguium: Secondary | ICD-10-CM | POA: Diagnosis not present

## 2020-03-30 DIAGNOSIS — M79674 Pain in right toe(s): Secondary | ICD-10-CM | POA: Diagnosis not present

## 2020-03-30 DIAGNOSIS — M79675 Pain in left toe(s): Secondary | ICD-10-CM | POA: Diagnosis not present

## 2020-03-30 DIAGNOSIS — L989 Disorder of the skin and subcutaneous tissue, unspecified: Secondary | ICD-10-CM

## 2020-03-30 NOTE — Progress Notes (Signed)
SUBJECTIVE Patient with a history of diabetes mellitus presents to office today complaining of elongated, thickened nails that cause pain while ambulating in shoes.  She is unable to trim her own nails.  Patient also complains of symptomatic calluses to the bilateral feet.  The calluses and her nails have been thickened symptomatic for several years.  She has had them trimmed before in the past.  She states that recently her son has been trimming her nails however he has now moved to [place], [place].  Patient is here for further evaluation and treatment.   Past Medical History:  Diagnosis Date  . Asthma   . Back pain   . Cataract   . Cervical disc disorder   . COPD (chronic obstructive pulmonary disease) ([place])   . Depression   . Diabetes ([place])    type II  . Diabetic neuropathy ([place])   . Dyspnea    with exertion  . GERD (gastroesophageal reflux disease)   . History of kidney stones    per patient "can't remember the year"  . Hyperlipidemia   . Hypertension    per patient "take meds for it"  . Insomnia   . Neuropathy   . Osteoarthritis   . Osteopenia   . Pneumonia 2018   per patient  . Reflux   . Rheumatoid arthritis ([place])   . Stroke ([place]) 2015   and again 2017  - Weakness in left leg, staggers w/ walking, vision   . Tenosynovitis     OBJECTIVE General Patient is awake, alert, and oriented x 3 and in no acute distress. Derm Skin is dry and supple bilateral. Negative open lesions or macerations. Remaining integument unremarkable. Nails are tender, long, thickened and dystrophic with subungual debris, consistent with onychomycosis, 1-5 bilateral. No signs of infection noted.  Hyperkeratotic callus tissue noted to the bilateral feet with sensitivity to palpation Vasc  DP and PT pedal pulses palpable bilaterally. Temperature gradient within normal limits.  Neuro Epicritic and protective threshold sensation diminished bilaterally.  Musculoskeletal Exam No symptomatic pedal  deformities noted bilateral. Muscular strength within normal limits.  ASSESSMENT 1. Diabetes Mellitus w/ peripheral neuropathy 2. Onychomycosis of nail due to dermatophyte bilateral 3.  Preulcerative callus lesions bilateral feet  4.  Pain in foot bilateral  PLAN OF CARE 1. Patient evaluated today. 2. Instructed to maintain good pedal hygiene and foot care. Stressed importance of controlling blood sugar.  3. Mechanical debridement of nails 1-5 bilaterally performed using a nail nipper. Filed with dremel without incident.  4.  Excisional debridement of the hyperkeratotic calluses was performed using a chisel blade without incident or bleeding  5.  Return to clinic in 4 weeks.  After debridement of the nails the patient would like to return to clinic to have her nails to the bilateral great toes permanently removed.  Informed her that we could arrange that and to return to clinic in 4 weeks for total permanent nail avulsions to the bilateral great toes.    [name], DPM [facility]  Dr. [name], DPM    [address]                                        [place], [place] [address]                Office [phone]  Fax [phone]  [phone]      

## 2020-03-31 ENCOUNTER — Telehealth: Payer: Self-pay

## 2020-03-31 ENCOUNTER — Ambulatory Visit: Payer: Self-pay | Admitting: Pharmacist

## 2020-03-31 DIAGNOSIS — I152 Hypertension secondary to endocrine disorders: Secondary | ICD-10-CM

## 2020-03-31 DIAGNOSIS — E118 Type 2 diabetes mellitus with unspecified complications: Secondary | ICD-10-CM

## 2020-03-31 DIAGNOSIS — E1159 Type 2 diabetes mellitus with other circulatory complications: Secondary | ICD-10-CM

## 2020-03-31 DIAGNOSIS — I10 Essential (primary) hypertension: Secondary | ICD-10-CM | POA: Diagnosis not present

## 2020-03-31 DIAGNOSIS — F324 Major depressive disorder, single episode, in partial remission: Secondary | ICD-10-CM | POA: Diagnosis not present

## 2020-03-31 DIAGNOSIS — F17200 Nicotine dependence, unspecified, uncomplicated: Secondary | ICD-10-CM

## 2020-03-31 DIAGNOSIS — E785 Hyperlipidemia, unspecified: Secondary | ICD-10-CM | POA: Diagnosis not present

## 2020-03-31 DIAGNOSIS — Z794 Long term (current) use of insulin: Secondary | ICD-10-CM

## 2020-03-31 NOTE — Chronic Care Management (AMB) (Signed)
Chronic Care Management   Follow Up Note   03/31/2020 Name: Kendra [name] MRN: [id number] DOB: 17-Mar-1946  Referred by: [name], FNP Reason for referral : Chronic Care Management (Patient Phone Call)   Kendra [name] is a 74 y.o. year old female who is a primary care patient of [name], [place]. The CCM team was consulted for assistance with chronic disease management and care coordination needs.    I reached out to [name] by phone today.   Review of patient status, including review of consultants reports, relevant laboratory and other test results, and collaboration with appropriate care team members and the patient's provider was performed as part of comprehensive patient evaluation and provision of chronic care management services.     Outpatient Encounter Medications as of 03/31/2020  Medication Sig  . lisinopril (ZESTRIL) 5 MG tablet Take 1 tablet (5 mg total) by mouth daily.  . metFORMIN (GLUCOPHAGE-XR) 500 MG 24 hr tablet TAKE 1 TABLET BY MOUTH TWICE DAILY BEFORE A MEAL  . albuterol (VENTOLIN HFA) 108 (90 Base) MCG/ACT inhaler INHALE 1-2 PUFFS EVERY 6 HOURS AS NEEDEDSHORTNESS OF BREATH AND WHEEZING  . aspirin 325 MG tablet Take 325 mg by mouth daily.  [name] atorvastatin (LIPITOR) 40 MG tablet TAKE 1 TABLET BY MOUTH ONCE DAILY  . Blood Glucose Monitoring Suppl (ONETOUCH VERIO) w/Device KIT 1 kit by Does not apply route 2 (two) times daily.  [name] buPROPion (WELLBUTRIN XL) 150 MG 24 hr tablet TAKE 1 TABLET BY MOUTH ONCE DAILY  . Cholecalciferol (VITAMIN D3) 50 MCG (2000 UT) capsule Take 1 capsule (2,000 Units total) by mouth daily.  . clopidogrel (PLAVIX) 75 MG tablet TAKE 1 TABLET BY MOUTH ONCE DAILY  . diclofenac Sodium (VOLTAREN) 1 % GEL Apply 2 g topically 4 (four) times daily.  [name] glucose blood (ONETOUCH VERIO) test strip Use as instructed  . ibuprofen (ADVIL) 600 MG tablet Take 1 tablet (600 mg total) by mouth every 8 (eight) hours as needed.  [name]  Devices (ONE TOUCH DELICA LANCING DEV) MISC 1 Device by Does not apply route 2 (two) times daily.  . mupirocin ointment (BACTROBAN) 2 % Apply 1 application topically daily.  . nicotine polacrilex (COMMIT) 4 MG lozenge Take 4 mg by mouth as needed for smoking cessation.  . ondansetron (ZOFRAN) 4 MG tablet Take 2 tablets (8 mg total) by mouth every 8 (eight) hours as needed for nausea or vomiting.  [name] 1 Device by Does not apply route 2 (two) times daily.  . TRELEGY ELLIPTA 100-62.5-25 MCG/INH AEPB INHALE 1 PUFF INTO THE LUNGS ONCE DAILY   No facility-administered encounter medications on file as of 03/31/2020.    Goals Addressed            This Visit's Progress   . PharmD-Medication Adherence       Current Barriers:  . Financial Barriers . Knowledge deficits related to coordination of her own care . Limited social support   Pharmacist Clinical Goal(s):   [name] Over the next 30 days, patient will demonstrate improved medication adherence as evidenced by verbalized understanding of prescribed medication regimen, assistance available, and patient report of adherence  Interventions: . Follow up regarding headaches - [name] reports issue improved; denies recent headaches . Reports continues to have numbness off and on near incision site on right side of neck. Encourage patient to follow up with provider ? States will call [facility] today  as she is wondering if this has to do with the surgery. Provide patient with this phone number. [name] Counsel patient on importance of blood pressure monitoring ? Reports taking lisinopril 5 mg once daily now from pill pack ? Reports monitoring home BP with new upper arm monitor ? Last checked on 7/12: 114/79, HR 73 . Counsel on continued importance of blood sugar control  ? Reports taking metformin ER 500 mg twice daily from pill pack ? Reports CBGs ranging: 90s-130s . Counsel on importance of smoking  cessation o Reports currently smoking: ~1 cigarette/day  States "I take 2 pulls and put it out" a couple of times during the day o Using nicotine lozenges as needed as directed on package as cessation aid o Reports continuing to work with [employer] . Follow up regarding COVID-19 vaccination  o Reports received 1st dose of Pfizer COVID-19 vaccine on 6/15. o Reports missed appointment to have 2nd dose on 7/6 due to transportation issue o Counsel patient on importance of receiving this second dose. Patient reports transportation continues to be a barrier.   Encourage patient to reschedule appointment for 2nd dose and then call [employer] to schedule transportation - let patient know that I will follow up later today to make sure she was able to schedule transport. . Will send referral to Care Guide team - patient requesting help with obtaining further samples/coupons of Glucerna . Will mail patient additional pages of blood sugar and blood pressure logs as requested.  UPDATE . Follow up with patient regarding coordination of care o [name] reports that she has left a message for her provider at [facility] requesting a call back o Reports that a friend was able to provide her with transportation, so she went and received 2nd dose of [place] COVID-19 vaccine today. Update record in chart.  Patient Self Care Activities:  . Patient takes medications as directed with aid of adherence tools. o Using pill packaging from [facility] . Calls pharmacy for medication refills . Patient to attend scheduled medical appointments . Calls provider office for new concerns or questions  . Patient to check blood sugars and keep log . Patient to check blood pressure daily and keep log  Please see past updates related to this goal by clicking on the "Past Updates" button in the selected goal          Plan  The care management team will reach out to the patient again over the next 30 days.    [name], PharmD, [place] [employer] [phone]

## 2020-03-31 NOTE — Patient Instructions (Signed)
Thank you allowing the Chronic Care Management Team to be a part of your care! It was a pleasure speaking with you today!     CCM (Chronic Care Management) Team    [name] RN, MSN, CCM Nurse Care Coordinator  [phone]   [name] PharmD  Clinical Pharmacist  [phone]   [name] LCSW Clinical Social Worker [phone]  Visit Information  Goals Addressed            This Visit's Progress   . PharmD-Medication Adherence       Current Barriers:  . Financial Barriers . Knowledge deficits related to coordination of her own care . Limited social support   Pharmacist Clinical Goal(s):   [name] Over the next 30 days, patient will demonstrate improved medication adherence as evidenced by verbalized understanding of prescribed medication regimen, assistance available, and patient report of adherence  Interventions: . Follow up regarding headaches - Ms. Scroggins reports issue improved; denies recent headaches . Reports continues to have numbness off and on near incision site on right side of neck. Encourage patient to follow up with provider ? States will call [facility] today as she is wondering if this has to do with the surgery. Provide patient with this phone number. [name] Counsel patient on importance of blood pressure monitoring ? Reports taking lisinopril 5 mg once daily now from pill pack ? Reports monitoring home BP with new upper arm monitor ? Last checked on 7/12: 114/79, HR 73 . Counsel on continued importance of blood sugar control  ? Reports taking metformin ER 500 mg twice daily from pill pack ? Reports CBGs ranging: 90s-130s . Counsel on importance of smoking cessation o Reports currently smoking: ~1 cigarette/day  States "I take 2 pulls and put it out" a couple of times during the day o Using nicotine lozenges as needed as directed on package as cessation aid o Reports continuing to work with [employer] . Follow up regarding  COVID-19 vaccination  o Reports received 1st dose of Pfizer COVID-19 vaccine on 6/15. o Reports missed appointment to have 2nd dose on 7/6 due to transportation issue o Counsel patient on importance of receiving this second dose. Patient reports transportation continues to be a barrier.   Encourage patient to reschedule appointment for 2nd dose and then call [employer] to schedule transportation - let patient know that I will follow up later today to make sure she was able to schedule transport. . Will send referral to Care Guide team - patient requesting help with obtaining further samples/coupons of Glucerna . Will mail patient additional pages of blood sugar and blood pressure logs as requested.  UPDATE . Follow up with patient regarding coordination of care o Ms. Kuras reports that she has left a message for her provider at [facility] requesting a call back o Reports that a friend was able to provide her with transportation, so she went and received 2nd dose of [place] COVID-19 vaccine today. Update record in chart.  Patient Self Care Activities:  . Patient takes medications as directed with aid of adherence tools. o Using pill packaging from [facility] . Calls pharmacy for medication refills . Patient to attend scheduled medical appointments . Calls provider office for new concerns or questions  . Patient to check blood sugars and keep log . Patient to check blood pressure daily and keep log  Please see past updates related to this goal by clicking on the "Past Updates" button  in the selected goal          Patient verbalizes understanding of instructions provided today.   The care management team will reach out to the patient again over the next 30 days.   [name], PharmD, [place] [employer] [phone]

## 2020-03-31 NOTE — Telephone Encounter (Signed)
Pt called triage with c/o numbness from chin to ears on R side (s/p R CEA 10/21/19). This has been going on for 3 weeks she states. Per PA she is to come in for an appt. Pt needs at least 3 days notice for transportation, so an appt has been made for her to be seen on Monday. Pt will call back if anything changes/worsens.

## 2020-04-05 ENCOUNTER — Other Ambulatory Visit: Payer: Self-pay | Admitting: Family Medicine

## 2020-04-05 ENCOUNTER — Other Ambulatory Visit: Payer: Self-pay

## 2020-04-05 ENCOUNTER — Ambulatory Visit (INDEPENDENT_AMBULATORY_CARE_PROVIDER_SITE_OTHER): Payer: Medicare Other | Admitting: Physician Assistant

## 2020-04-05 VITALS — BP 109/72 | HR 89 | Temp 97.1°F | Resp 20 | Ht 63.0 in | Wt 125.2 lb

## 2020-04-05 DIAGNOSIS — I6523 Occlusion and stenosis of bilateral carotid arteries: Secondary | ICD-10-CM | POA: Diagnosis not present

## 2020-04-05 DIAGNOSIS — E1165 Type 2 diabetes mellitus with hyperglycemia: Secondary | ICD-10-CM

## 2020-04-05 DIAGNOSIS — IMO0002 Reserved for concepts with insufficient information to code with codable children: Secondary | ICD-10-CM

## 2020-04-05 NOTE — Progress Notes (Signed)
Office Note     CC:  follow up Requesting Provider:  [name], FNP  HPI: Kendra [name] is a 74 y.o. (02/27/1946) female who presents for follow up. She is s/p Right carotid endarterectomy on 11/10/19 by Dr. Donzetta Matters for symptomatic carotid stenosis. She is complaining of soreness and numbness along incision. She says this has been present since the surgery. She otherwise denies any difficulty swallowing, trouble speaking, facial drooping, amaurosis fugax, or numbness or weakness of her upper or lower extremities. She has history of more recent left CEA on 12/30/19 by Dr. Donzetta Matters for asymptomatic stenosis. She remains on Aspirin, Plavix and Statin  The pt is on a statin for cholesterol management.  The pt is on a daily aspirin.   Other AC:  Plavix The pt not on medication for hypertension.   The pt is diabetic.  Tobacco hx: current smoker  Past Medical History:  Diagnosis Date  . Asthma   . Back pain   . Cataract   . Cervical disc disorder   . COPD (chronic obstructive pulmonary disease) ([place])   . Depression   . Diabetes ([place])    type II  . Diabetic neuropathy ([place])   . Dyspnea    with exertion  . GERD (gastroesophageal reflux disease)   . History of kidney stones    per patient "can't remember the year"  . Hyperlipidemia   . Hypertension    per patient "take meds for it"  . Insomnia   . Neuropathy   . Osteoarthritis   . Osteopenia   . Pneumonia 2018   per patient  . Reflux   . Rheumatoid arthritis ([place])   . Stroke ([place]) 2015   and again 2017  - Weakness in left leg, staggers w/ walking, vision   . Tenosynovitis     Past Surgical History:  Procedure Laterality Date  . BREAST BIOPSY Right    Fatty Tumor  . ENDARTERECTOMY Right 10/21/2019   Procedure: ENDARTERECTOMY CAROTID RIGHT;  Surgeon: [name], MD;  Location: [facility];  Service: Vascular;  Laterality: Right;  . ENDARTERECTOMY Left 12/30/2019   Procedure: LEFT CAROTID ENDARTERECTOMY;  Surgeon:  [name], MD;  Location: [facility];  Service: Vascular;  Laterality: Left;  . NECK SURGERY    . OVARIAN CYST REMOVAL    . PATCH ANGIOPLASTY Right 10/21/2019   Procedure: Patch Angioplasty Using XenoSure Biologic Patch Right Carotid;  Surgeon: [name], MD;  Location: [facility];  Service: Vascular;  Laterality: Right;  . PATCH ANGIOPLASTY Left 12/30/2019   Procedure: Patch Angioplasty Left Carotid;  Surgeon: [name], MD;  Location: [facility];  Service: Vascular;  Laterality: Left;    Social History   Socioeconomic History  . Marital status: Widowed    Spouse name: Not on file  . Number of children: Not on file  . Years of education: Not on file  . Highest education level: Not on file  Occupational History  . Not on file  Tobacco Use  . Smoking status: Current Some Day Smoker    Types: Cigarettes  . Smokeless tobacco: Former Systems developer    Types: Snuff  . Tobacco comment: .5 of 1 cigarette a day  Vaping Use  . Vaping Use: Never used  Substance and Sexual Activity  . Alcohol use: No  . Drug use: No  . Sexual activity: Never  Other Topics Concern  . Not on file  Social History Narrative  . Not on file  Social Determinants of Health   Financial Resource Strain:   . Difficulty of Paying Living Expenses:   Food Insecurity:   . Worried About Charity fundraiser in the Last Year:   . Arboriculturist in the Last Year:   Transportation Needs:   . Film/video editor (Medical):   [name] Lack of Transportation (Non-Medical):   Physical Activity:   . Days of Exercise per Week:   . Minutes of Exercise per Session:   Stress:   . Feeling of Stress :   Social Connections:   . Frequency of Communication with Friends and Family:   . Frequency of Social Gatherings with Friends and Family:   . Attends Religious Services:   . Active Member of Clubs or Organizations:   . Attends Archivist Meetings:   [name] Marital Status:   Intimate Partner  Violence:   . Fear of Current or Ex-Partner:   . Emotionally Abused:   [name] Physically Abused:   . Sexually Abused:     Family History  Problem Relation Age of Onset  . Stroke Father   . Depression Father   . Diabetes Mother   . Hyperlipidemia Mother   . Drug abuse Sister   . Heart murmur Son   . Drug abuse Sister   . Heart murmur Son   . Heart murmur Son     Current Outpatient Medications  Medication Sig Dispense Refill  . albuterol (VENTOLIN HFA) 108 (90 Base) MCG/ACT inhaler INHALE 1-2 PUFFS EVERY 6 HOURS AS NEEDEDSHORTNESS OF BREATH AND WHEEZING 18 g 2  . aspirin 325 MG tablet Take 325 mg by mouth daily.    [name] atorvastatin (LIPITOR) 40 MG tablet TAKE 1 TABLET BY MOUTH ONCE DAILY 90 tablet 0  . Blood Glucose Monitoring Suppl (ONETOUCH VERIO) w/Device KIT 1 kit by Does not apply route 2 (two) times daily. 1 kit 0  . buPROPion (WELLBUTRIN XL) 150 MG 24 hr tablet TAKE 1 TABLET BY MOUTH ONCE DAILY 90 tablet 0  . Cholecalciferol (VITAMIN D3) 50 MCG (2000 UT) capsule Take 1 capsule (2,000 Units total) by mouth daily. 90 capsule 1  . clopidogrel (PLAVIX) 75 MG tablet TAKE 1 TABLET BY MOUTH ONCE DAILY 90 tablet 0  . diclofenac Sodium (VOLTAREN) 1 % GEL Apply 2 g topically 4 (four) times daily. 50 g 1  . glucose blood (ONETOUCH VERIO) test strip Use as instructed 200 each 4  . ibuprofen (ADVIL) 600 MG tablet Take 1 tablet (600 mg total) by mouth every 8 (eight) hours as needed. 30 tablet 0  . Lancet Devices (ONE TOUCH DELICA LANCING DEV) MISC 1 Device by Does not apply route 2 (two) times daily. 200 each 4  . lisinopril (ZESTRIL) 5 MG tablet Take 1 tablet (5 mg total) by mouth daily. 90 tablet 3  . metFORMIN (GLUCOPHAGE-XR) 500 MG 24 hr tablet TAKE 1 TABLET BY MOUTH TWICE DAILY BEFORE A MEAL 60 tablet 3  . mupirocin ointment (BACTROBAN) 2 % Apply 1 application topically daily. 22 g 0  . nicotine polacrilex (COMMIT) 4 MG lozenge Take 4 mg by mouth as needed for smoking cessation.    .  ondansetron (ZOFRAN) 4 MG tablet Take 2 tablets (8 mg total) by mouth every 8 (eight) hours as needed for nausea or vomiting. 20 tablet 0  . OneTouch Delica Lancets 96Q MISC 1 Device by Does not apply route 2 (two) times daily. 200 each 4  . TRELEGY ELLIPTA 100-62.5-25 MCG/INH  AEPB INHALE 1 PUFF INTO THE LUNGS ONCE DAILY 60 each 3   No current facility-administered medications for this visit.    Allergies  Allergen Reactions  . Citalopram Nausea And Vomiting     REVIEW OF SYSTEMS:   '[X]'$  denotes positive finding, '[ ]'$  denotes negative finding Cardiac  Comments:  Chest pain or chest pressure:    Shortness of breath upon exertion:    Short of breath when lying flat:    Irregular heart rhythm:        Vascular    Pain in calf, thigh, or hip brought on by ambulation:    Pain in feet at night that wakes you up from your sleep:     Blood clot in your veins:    Leg swelling:         Pulmonary    Oxygen at home:    Productive cough:     Wheezing:         Neurologic    Sudden weakness in arms or legs:     Sudden numbness in arms or legs:     Sudden onset of difficulty speaking or slurred speech:    Temporary loss of vision in one eye:     Problems with dizziness:         Gastrointestinal    Blood in stool:     Vomited blood:         Genitourinary    Burning when urinating:     Blood in urine:        Psychiatric    Major depression:         Hematologic    Bleeding problems:    Problems with blood clotting too easily:        Skin    Rashes or ulcers:        Constitutional    Fever or chills:      PHYSICAL EXAMINATION:  Vitals:   04/05/20 0835  BP: 109/72  Pulse: 89  Resp: 20  Temp: (!) 97.1 F (36.2 C)  TempSrc: Temporal  SpO2: 97%  Weight: 125 lb 3.2 oz (56.8 kg)  Height: '5\' 3"'$  (1.6 m)    General:  WDWN in NAD; vital signs documented above Gait: Normal HENT: WNL, normocephalic Pulmonary: normal non-labored breathing , without wheezing Cardiac:  regular HR, without  Murmurs without carotid bruit. S/p bilateral carotid endartectomies. Incisions well healed. No swelling or hematoma. Right incision tender to palpation. Numbness to palpation from angle of mandible to chin and to level of incision on right neck Abdomen: soft, NT, no masses Extremities: moving all extremities without deficits. Well perfused and warm Musculoskeletal: no muscle wasting or atrophy  Neurologic: A&O X 3;  No focal weakness or paresthesias are detected Psychiatric:  The pt has Normal affect.  ASSESSMENT/PLAN:: 74 y.o. female here for follow up. She is status post most recently right CEA in February 2021 by Dr. Donzetta Matters. She has been having numbness and tenderness along right neck incision. I provided reassurance that this is common due to division of superficial nerves. Discussed with her that there is likelihood that this will improve with time but may not 100% resolve. She otherwise is Neurologically intact and has not had any new TIA or stoke like symptoms. She will continue her Aspirin, statin and plavix. I advised her to follow up sooner should she have any new or concerning symptoms otherwise she will follow up in October for her 6 months post op duplex.  Karoline Caldwell, PA-C [facility] [phone]  Clinic MD: Carlis Abbott

## 2020-04-12 ENCOUNTER — Other Ambulatory Visit: Payer: Self-pay | Admitting: Family Medicine

## 2020-04-12 DIAGNOSIS — J449 Chronic obstructive pulmonary disease, unspecified: Secondary | ICD-10-CM

## 2020-04-12 DIAGNOSIS — M79641 Pain in right hand: Secondary | ICD-10-CM

## 2020-04-21 ENCOUNTER — Telehealth: Payer: Self-pay

## 2020-04-21 NOTE — Telephone Encounter (Signed)
Copied from [place] [id number]. Topic: General - Inquiry >> Apr 16, 2020  3:42 PM Hinda Lenis D wrote: PT has questions about this medication / please advise  clopidogrel (PLAVIX) 75 MG tablet [[id number]]       I spoke with the patient and she informed me that she has an upcoming procedure with her podiatrist [name], DPM, [place]. She is under the impression that she suppose to hold her Plavix until after her procedure.   She call our office to clarify if this is a safe decision. I informed the patient that she need to reach back out to her podiatrist office. The patient been off her Plavix for 3 days. I called [place] and left a detail message on Angie, Dr Amalia Hailey nurse voicemail.

## 2020-04-22 NOTE — Telephone Encounter (Signed)
I would defer to podiatry if they would like her off of the plavix.  If they request that she is bridged with another medication, we can work on that.  Thanks

## 2020-04-23 ENCOUNTER — Ambulatory Visit (INDEPENDENT_AMBULATORY_CARE_PROVIDER_SITE_OTHER): Payer: Medicare Other | Admitting: Podiatry

## 2020-04-23 ENCOUNTER — Other Ambulatory Visit: Payer: Self-pay

## 2020-04-23 DIAGNOSIS — M79675 Pain in left toe(s): Secondary | ICD-10-CM

## 2020-04-23 DIAGNOSIS — E0843 Diabetes mellitus due to underlying condition with diabetic autonomic (poly)neuropathy: Secondary | ICD-10-CM

## 2020-04-23 DIAGNOSIS — M79674 Pain in right toe(s): Secondary | ICD-10-CM

## 2020-04-23 DIAGNOSIS — L603 Nail dystrophy: Secondary | ICD-10-CM | POA: Diagnosis not present

## 2020-04-23 DIAGNOSIS — B351 Tinea unguium: Secondary | ICD-10-CM

## 2020-04-23 MED ORDER — DOXYCYCLINE HYCLATE 100 MG PO TABS
100.0000 mg | ORAL_TABLET | Freq: Two times a day (BID) | ORAL | 0 refills | Status: DC
Start: 2020-04-23 — End: 2020-05-12

## 2020-04-23 MED ORDER — GENTAMICIN SULFATE 0.1 % EX CREA
1.0000 | TOPICAL_CREAM | Freq: Two times a day (BID) | CUTANEOUS | 1 refills | Status: DC
Start: 2020-04-23 — End: 2020-05-25

## 2020-04-23 NOTE — Progress Notes (Addendum)
SUBJECTIVE Patient with a history of diabetes mellitus presents to office today complaining of elongated, thickened nails that cause pain while ambulating in shoes.  She was last seen in the office on 03/09/2020 at which time debridement was performed.  Patient continues to have pain associated to the dystrophic thickened nails to the bilateral great toes.  She would like to have the nail plates permanently removed today which was discussed in detail last visit.  No new complaints at this time  Past Medical History:  Diagnosis Date  . Asthma   . Back pain   . Cataract   . Cervical disc disorder   . COPD (chronic obstructive pulmonary disease) ([place])   . Depression   . Diabetes ([place])    type II  . Diabetic neuropathy ([place])   . Dyspnea    with exertion  . GERD (gastroesophageal reflux disease)   . History of kidney stones    per patient "can't remember the year"  . Hyperlipidemia   . Hypertension    per patient "take meds for it"  . Insomnia   . Neuropathy   . Osteoarthritis   . Osteopenia   . Pneumonia 2018   per patient  . Reflux   . Rheumatoid arthritis ([place])   . Stroke ([place]) 2015   and again 2017  - Weakness in left leg, staggers w/ walking, vision   . Tenosynovitis     OBJECTIVE General Patient is awake, alert, and oriented x 3 and in no acute distress. Derm Skin is dry and supple bilateral. Negative open lesions or macerations. Remaining integument unremarkable. Nails are tender, long, thickened and dystrophic with subungual debris, consistent with onychomycosis, 1-5 bilateral. No signs of infection noted.  Hyperkeratotic callus tissue noted to the bilateral feet with sensitivity to palpation Vasc  DP and PT pedal pulses are palpable bilaterally. Temperature gradient within normal limits.  Neuro Epicritic and protective threshold sensation diminished bilaterally.  Musculoskeletal Exam No symptomatic pedal deformities noted bilateral. Muscular strength within normal  limits.  ASSESSMENT 1. Diabetes Mellitus w/ peripheral neuropathy 2. Onychomycosis of nail due to dermatophyte bilateral 3.  Pain in foot bilateral  PLAN OF CARE 1. Patient evaluated today. 2.  Today we discussed total permanent nail avulsion procedure in detail.  All patient questions were answered.  No guarantees were expressed or implied.  We also discussed that since the patient is on Plavix secondary to stroke we can continue with the nail avulsion procedure since it is very minimally invasive although it may have some excessive bleeding.  Patient is okay and will be able to go home and elevate the feet today. 3.  3 mL of 2% lidocaine plain was infiltrated in each great toe in a digital block fashion.  The toes were prepped in aseptic manner. 4.  Total permanent nail avulsions were performed to the bilateral great toes with 3 x 63-KZSWFU application of phenol followed by an alcohol flush. 5.  Dressings applied. Post care instructions provided for the patient with verbal reinforcement.  6. Prescription for gentamicin cream.  Apply 2 times daily beginning tomorrow 7.  Prescription for doxycycline 100 mg 2 times daily for 1 week for prophylaxis 8.  Return to clinic in 3 weeks  [name], DPM [facility]  Dr. [name], DPM    [address]  [place], [place] [address]                Office [phone]  Fax [phone]

## 2020-04-23 NOTE — Patient Instructions (Signed)

## 2020-04-27 ENCOUNTER — Telehealth: Payer: Self-pay

## 2020-04-27 ENCOUNTER — Ambulatory Visit: Payer: Medicare Other | Admitting: Family Medicine

## 2020-04-27 NOTE — Telephone Encounter (Signed)
Can reach out to podiatry.  They may be able to assist with this.  We can put in a referral to home health if she'd like

## 2020-04-27 NOTE — Telephone Encounter (Signed)
Copied from [place] [id number]. Topic: General - Inquiry >> Apr 27, 2020  3:41 PM Percell Belt A wrote: Reason for CRM: pt called in and stated that she had both of her big toe nails removed and told her to change the wrapping twice a day.  She is having a hard time wrapping and would like to know if there is anyway she can get some help.  She stated she has no one to help her with this  Best number [phone]     The pt was notified of the recommendation. She verbalize understanding, no question or concern.

## 2020-04-28 ENCOUNTER — Telehealth: Payer: Self-pay

## 2020-05-04 ENCOUNTER — Ambulatory Visit (INDEPENDENT_AMBULATORY_CARE_PROVIDER_SITE_OTHER): Payer: Medicare Other | Admitting: Licensed Clinical Social Worker

## 2020-05-04 DIAGNOSIS — Z794 Long term (current) use of insulin: Secondary | ICD-10-CM

## 2020-05-04 DIAGNOSIS — E118 Type 2 diabetes mellitus with unspecified complications: Secondary | ICD-10-CM

## 2020-05-04 DIAGNOSIS — I152 Hypertension secondary to endocrine disorders: Secondary | ICD-10-CM

## 2020-05-04 DIAGNOSIS — M19041 Primary osteoarthritis, right hand: Secondary | ICD-10-CM

## 2020-05-04 DIAGNOSIS — F324 Major depressive disorder, single episode, in partial remission: Secondary | ICD-10-CM

## 2020-05-04 DIAGNOSIS — E1169 Type 2 diabetes mellitus with other specified complication: Secondary | ICD-10-CM

## 2020-05-04 DIAGNOSIS — E1159 Type 2 diabetes mellitus with other circulatory complications: Secondary | ICD-10-CM

## 2020-05-04 NOTE — Chronic Care Management (AMB) (Signed)
Chronic Care Management    Clinical Social Work Follow Up Note  05/04/2020 Name: Kendra [name] MRN: [id number] DOB: [birth year]  Kendra [name] is a 74 y.o. year old female who is a primary care patient of [name], FNP. The CCM team was consulted for assistance with [employer]  and [place] [facility] [employer].   Review of patient status, including review of consultants reports, other relevant assessments, and collaboration with appropriate care team members and the patient's provider was performed as part of comprehensive patient evaluation and provision of chronic care management services.    SDOH (Social Determinants of Health) assessments performed: Yes    Outpatient Encounter Medications as of 05/04/2020  Medication Sig  . albuterol (VENTOLIN HFA) 108 (90 Base) MCG/ACT inhaler INHALE 1-2 PUFFS EVERY 6 HOURS AS NEEDEDSHORTNESS OF BREATH AND WHEEZING  . aspirin 325 MG tablet Take 325 mg by mouth daily.  [name] atorvastatin (LIPITOR) 40 MG tablet TAKE 1 TABLET BY MOUTH ONCE DAILY  . Blood Glucose Monitoring Suppl (ONETOUCH VERIO) w/Device KIT USE AS DIRECTED  . buPROPion (WELLBUTRIN XL) 150 MG 24 hr tablet TAKE 1 TABLET BY MOUTH ONCE DAILY  . Cholecalciferol (VITAMIN D3) 50 MCG (2000 UT) capsule Take 1 capsule (2,000 Units total) by mouth daily.  . clopidogrel (PLAVIX) 75 MG tablet TAKE 1 TABLET BY MOUTH ONCE DAILY  . diclofenac Sodium (VOLTAREN) 1 % GEL APPLY 2 GRAMS TOPICALLY 4 TIMES DAILY  . doxycycline (VIBRA-TABS) 100 MG tablet Take 1 tablet (100 mg total) by mouth 2 (two) times daily.  [name] gentamicin cream (GARAMYCIN) 0.1 % Apply 1 application topically 2 (two) times daily.  [name] glucose blood (ONETOUCH VERIO) test strip Use as instructed  . ibuprofen (ADVIL) 600 MG tablet Take 1 tablet (600 mg total) by mouth every 8 (eight) hours as needed.  [name] Devices (ONE TOUCH DELICA LANCING DEV) MISC 1 Device by Does not apply route 2 (two) times daily.  [name]  lisinopril (ZESTRIL) 5 MG tablet Take 1 tablet (5 mg total) by mouth daily.  . metFORMIN (GLUCOPHAGE-XR) 500 MG 24 hr tablet TAKE 1 TABLET BY MOUTH TWICE DAILY BEFORE A MEAL  . mupirocin ointment (BACTROBAN) 2 % Apply 1 application topically daily.  . nicotine polacrilex (COMMIT) 4 MG lozenge Take 4 mg by mouth as needed for smoking cessation.  . ondansetron (ZOFRAN) 4 MG tablet Take 2 tablets (8 mg total) by mouth every 8 (eight) hours as needed for nausea or vomiting.  [name] Lancets 25Z MISC 1 Device by Does not apply route 2 (two) times daily.  . TRELEGY ELLIPTA 100-62.5-25 MCG/INH AEPB INHALE 1 PUFF INTO THE LUNGS ONCE DAILY   No facility-administered encounter medications on file as of 05/04/2020.     Goals Addressed    .  SW-I probably could use some extra support right now. (pt-stated)        Current Barriers:  . Financial constraints . Limited social support . ADL IADL limitations . Mental Health Concerns  . Social Isolation . Limited access to caregiver . Inability to perform ADL's independently . Inability to perform IADL's independently . Lacks knowledge of community resource: grief support resources within the area  Clinical Social Work Clinical Goal(s):  [name] Over the next 120 days, patient will work with SW to address concerns related to gaining additional support within the home and resource connection in order to maintain health and independency within the community  . Over the next 120 days,  patient will demonstrate improved adherence to self care as evidenced by implementing healthy self-care into her daily routine such as: attending all medical appointments, taking time for self-reflection, taking medications as prescribed, drinking water and daily exercise to improve mobility.  Interventions: . Patient interviewed and appropriate assessments performed . Education provided on how to implement appropriate self-care and coping tools into her daily routine.  Patient was receptive to this education. . Patient reports that her feet continue to swell even though she elevates them on a pillow regularly. Patient reports that her right toe is red and irritated. Patient shares that she put some pain relief ointment on it last night which helped. . Patient is experiencing increase stress at this time because her son will have to go to prison for 15 months starting 05/31/20. Patient is very concerned about patient's physical and mental state while serving time. Patient reports that her son was responsible for paying most of her bills and this concerns her as well.  . Provided mental health counseling with regard to current stressors. LCSW used active and reflective listening and implemented appropriate interventions to help suppport patient and her emotional needs. Advised patient to implement deep breathing/grounding/meditation/self-care exercises into her daily routine to combat stressors. Education provided.  [name] LCSW was informed that patient's sleep hygiene and routine have improved. Positive reinforcement provided to patient.  [name] LCSW reviewed upcoming appointments with patient. She is agreeable to contact [facility] to set up transportation arrangements for next weeks' appointments.  . Provided patient with information about grief support resources within her area. Patient denies needing this support at this time but was appreciative of education that was provided.  . Discussed plans with patient for ongoing care management follow up and provided patient with direct contact information for care management team . Patient reports that she has been managing her blood pressure very well and is proud of this accomplishment. LCSW provided education on healthy self-care and provide positive reinforcement for taking action to improve her overall health and symptoms.  . Assisted patient/caregiver with obtaining information about health plan benefits . LCSW used active and  reflective listening and implemented appropriate interventions to help suppport patient and her emotional needs.  Patient Self Care Activities:  . Attends all scheduled provider appointments . Calls provider office for new concerns or questions  Please see past updates related to this goal by clicking on the "Past Updates" button in the selected goal       Follow Up Plan: SW will follow up with patient by phone over the next quarter  [name], [place], MSW, [email or web address] Phone: [phone]

## 2020-05-11 ENCOUNTER — Other Ambulatory Visit: Payer: Self-pay | Admitting: Family Medicine

## 2020-05-11 DIAGNOSIS — Z8673 Personal history of transient ischemic attack (TIA), and cerebral infarction without residual deficits: Secondary | ICD-10-CM

## 2020-05-11 DIAGNOSIS — E785 Hyperlipidemia, unspecified: Secondary | ICD-10-CM

## 2020-05-11 DIAGNOSIS — E1165 Type 2 diabetes mellitus with hyperglycemia: Secondary | ICD-10-CM

## 2020-05-11 DIAGNOSIS — F3341 Major depressive disorder, recurrent, in partial remission: Secondary | ICD-10-CM

## 2020-05-11 DIAGNOSIS — IMO0002 Reserved for concepts with insufficient information to code with codable children: Secondary | ICD-10-CM

## 2020-05-11 DIAGNOSIS — Z794 Long term (current) use of insulin: Secondary | ICD-10-CM

## 2020-05-11 NOTE — Telephone Encounter (Signed)
Future appt in 2 weeks  

## 2020-05-11 NOTE — Telephone Encounter (Signed)
Requested Prescriptions  Pending Prescriptions Disp Refills  . atorvastatin (LIPITOR) 40 MG tablet [Pharmacy Med Name: ATORVASTATIN CALCIUM 40 MG TAB] 90 tablet 0    Sig: TAKE 1 TABLET BY MOUTH ONCE DAILY     Cardiovascular:  Antilipid - Statins Passed - 05/11/2020  3:51 PM      Passed - Total Cholesterol in normal range and within 360 days    Cholesterol, Total  Date Value Ref Range Status  06/07/2015 177 100 - 199 mg/dL Final   Cholesterol  Date Value Ref Range Status  02/06/2020 91 <200 mg/dL Final  09/02/2014 141 0 - 200 mg/dL Final         Passed - LDL in normal range and within 360 days    Ldl Cholesterol, Calc  Date Value Ref Range Status  09/02/2014 73 0 - 100 mg/dL Final   LDL Cholesterol (Calc)  Date Value Ref Range Status  02/06/2020 21 mg/dL (calc) Final    Comment:    Reference range: <100 . Desirable range <100 mg/dL for primary prevention;   <70 mg/dL for patients with CHD or diabetic patients  with > or = 2 CHD risk factors. [name] LDL-C is now calculated using the Martin-Hopkins  calculation, which is a validated novel method providing  better accuracy than the Friedewald equation in the  estimation of LDL-C.  Cresenciano Genre et al. Annamaria Helling. 1062;694(85): 2061-2068  (http://education.Quest[employer].com/faq/FAQ164)          Passed - HDL in normal range and within 360 days    HDL Cholesterol  Date Value Ref Range Status  09/02/2014 37 (L) 40 - 60 mg/dL Final   HDL  Date Value Ref Range Status  02/06/2020 54 > OR = 50 mg/dL Final  06/07/2015 56 >39 mg/dL Final    Comment:    According to ATP-III Guidelines, HDL-C >59 mg/dL is considered a negative risk factor for CHD.          Passed - Triglycerides in normal range and within 360 days    Triglycerides  Date Value Ref Range Status  02/06/2020 79 <150 mg/dL Final  09/02/2014 155 0 - 200 mg/dL Final         Passed - Patient is not pregnant      Passed - Valid encounter within last 12 months    Recent  Outpatient Visits          3 months ago Right hand pain   [facility] [place], [name], FNP   3 months ago Controlled type 2 diabetes mellitus with complication, with long-term current use of insulin [facility])   [facility], [name], FNP   3 months ago Ear pain, left   [place], FNP   7 months ago Essential hypertension   [place], DO   8 months ago Controlled type 2 diabetes mellitus with complication, with long-term current use of insulin ([place])   [place], DO      Future Appointments            In 2 weeks [place], [facility], [facility]           . buPROPion (WELLBUTRIN XL) 150 MG 24 hr tablet [Pharmacy Med Name: BUPROPION HCL ER (XL) 150 MG TAB] 90 tablet 0    Sig: TAKE 1 TABLET BY MOUTH ONCE DAILY     Psychiatry:  Antidepressants - bupropion Passed - 05/11/2020  3:51 PM      Passed - Completed PHQ-2 or PHQ-9 in the last 360 days.      Passed - Last BP in normal range    BP Readings from Last 1 Encounters:  04/05/20 109/72         Passed - Valid encounter within last 6 months    Recent Outpatient Visits          3 months ago Right hand pain   [facility] [place], [name], FNP   3 months ago Controlled type 2 diabetes mellitus with complication, with long-term current use of insulin [facility])   [place], FNP   3 months ago Ear pain, left   [place], FNP   7 months ago Essential hypertension   [place], DO   8 months ago Controlled type 2 diabetes mellitus with complication, with long-term current use of insulin [name])   [facility] Olin Hauser, DO      Future Appointments            In 2 weeks [place], [name], [place], [facility]

## 2020-05-12 ENCOUNTER — Ambulatory Visit: Payer: Self-pay | Admitting: Pharmacist

## 2020-05-12 DIAGNOSIS — E1159 Type 2 diabetes mellitus with other circulatory complications: Secondary | ICD-10-CM

## 2020-05-12 DIAGNOSIS — J432 Centrilobular emphysema: Secondary | ICD-10-CM

## 2020-05-12 DIAGNOSIS — M19041 Primary osteoarthritis, right hand: Secondary | ICD-10-CM

## 2020-05-12 DIAGNOSIS — I10 Essential (primary) hypertension: Secondary | ICD-10-CM | POA: Diagnosis not present

## 2020-05-12 DIAGNOSIS — I152 Hypertension secondary to endocrine disorders: Secondary | ICD-10-CM

## 2020-05-12 DIAGNOSIS — Z794 Long term (current) use of insulin: Secondary | ICD-10-CM

## 2020-05-12 DIAGNOSIS — E1169 Type 2 diabetes mellitus with other specified complication: Secondary | ICD-10-CM

## 2020-05-12 DIAGNOSIS — F17200 Nicotine dependence, unspecified, uncomplicated: Secondary | ICD-10-CM

## 2020-05-12 DIAGNOSIS — E785 Hyperlipidemia, unspecified: Secondary | ICD-10-CM | POA: Diagnosis not present

## 2020-05-12 DIAGNOSIS — E118 Type 2 diabetes mellitus with unspecified complications: Secondary | ICD-10-CM

## 2020-05-12 DIAGNOSIS — F324 Major depressive disorder, single episode, in partial remission: Secondary | ICD-10-CM | POA: Diagnosis not present

## 2020-05-12 NOTE — Chronic Care Management (AMB) (Signed)
Chronic Care Management   Follow Up Note   05/12/2020 Name: Kendra [name] MRN: [id number] DOB: [birth year]  Referred by: [name], FNP Reason for referral : Chronic Care Management (Patient Phone Call)   Kendra [name] is a 74 y.o. year old female who is a primary care patient of [name], [place]. The CCM team was consulted for assistance with chronic disease management and care coordination needs.    I reached out to [name] by phone today.   Review of patient status, including review of consultants reports, relevant laboratory and other test results, and collaboration with appropriate care team members and the patient's provider was performed as part of comprehensive patient evaluation and provision of chronic care management services.    SDOH (Social Determinants of Health) assessments performed: Yes See Care Plan activities for detailed interventions related to [facility])     Outpatient Encounter Medications as of 05/12/2020  Medication Sig  . albuterol (VENTOLIN HFA) 108 (90 Base) MCG/ACT inhaler INHALE 1-2 PUFFS EVERY 6 HOURS AS NEEDEDSHORTNESS OF BREATH AND WHEEZING  . lisinopril (ZESTRIL) 5 MG tablet Take 1 tablet (5 mg total) by mouth daily.  . metFORMIN (GLUCOPHAGE-XR) 500 MG 24 hr tablet TAKE 1 TABLET BY MOUTH TWICE DAILY BEFORE A MEAL  . TRELEGY ELLIPTA 100-62.5-25 MCG/INH AEPB INHALE 1 PUFF INTO THE LUNGS ONCE DAILY  . aspirin 325 MG tablet Take 325 mg by mouth daily.  [name] atorvastatin (LIPITOR) 40 MG tablet TAKE 1 TABLET BY MOUTH ONCE DAILY  . Blood Glucose Monitoring Suppl (ONETOUCH VERIO) w/Device KIT USE AS DIRECTED  . buPROPion (WELLBUTRIN XL) 150 MG 24 hr tablet TAKE 1 TABLET BY MOUTH ONCE DAILY  . Cholecalciferol (VITAMIN D3) 50 MCG (2000 UT) capsule Take 1 capsule (2,000 Units total) by mouth daily.  . clopidogrel (PLAVIX) 75 MG tablet TAKE 1 TABLET BY MOUTH ONCE DAILY  . diclofenac Sodium (VOLTAREN) 1 % GEL APPLY 2 GRAMS TOPICALLY 4 TIMES DAILY   . gentamicin cream (GARAMYCIN) 0.1 % Apply 1 application topically 2 (two) times daily.  [name] glucose blood (ONETOUCH VERIO) test strip Use as instructed  . Lancet Devices (ONE TOUCH DELICA LANCING DEV) MISC 1 Device by Does not apply route 2 (two) times daily.  . mupirocin ointment (BACTROBAN) 2 % Apply 1 application topically daily.  . nicotine polacrilex (COMMIT) 4 MG lozenge Take 4 mg by mouth as needed for smoking cessation.  . ondansetron (ZOFRAN) 4 MG tablet Take 2 tablets (8 mg total) by mouth every 8 (eight) hours as needed for nausea or vomiting.  [name] Lancets 56D MISC 1 Device by Does not apply route 2 (two) times daily.  . [DISCONTINUED] doxycycline (VIBRA-TABS) 100 MG tablet Take 1 tablet (100 mg total) by mouth 2 (two) times daily.  . [DISCONTINUED] ibuprofen (ADVIL) 600 MG tablet Take 1 tablet (600 mg total) by mouth every 8 (eight) hours as needed. (Patient not taking: Reported on 05/12/2020)   [name] facility-administered encounter medications on file as of 05/12/2020.    Goals Addressed            This Visit's Progress   . PharmD-Medication Adherence       Current Barriers:  . Financial Barriers . Knowledge deficits related to coordination of her own care . Limited social support   Pharmacist Clinical Goal(s):   [name] Over the next 30 days, patient will demonstrate improved medication adherence as evidenced by verbalized understanding of prescribed medication regimen, assistance available,  and patient report of adherence  Interventions: . Patient reports Podiatry removed nails from great toes on both feet and reports both continue to be sensitive/uncomfortable. States she would like to know if there is something further that she can do.  ? From review of chart, note procedure occurred on 8/6.  ? Reports has been soaking feet in Epsom salt solution as directed.  ? Reports completed 7-day course of doxycycline as directed and applied gentamicin ointment as  directed ? Note patient scheduled for follow up visit with Podiatry on 9/7 ? Encourage patient to follow up with Podiatry with question about removed toenails. Confirms having phone number. . Follow up regarding blood pressure monitoring ? Reports taking lisinopril 5 mg once daily from pill pack ? Reports monitoring home BP with new upper arm monitor ? Last checked on 8/24: 126/84, HR 85  . Counsel on continued importance of blood sugar control  ? Reports taking metformin ER 500 mg twice daily from pill pack ? Reports last checked CBG before breakfast on 8/24: 117 . Counsel on importance of medication adherence o Confirms continues to take medications as directed from pill packs as received from [place] misses a dose ~ once/week  Encourage patient to start using alarm on phone as additional adherence tool o Confirms using maintenance inhaler (Trelegy) daily and rescue (albuterol) as needed as directed. Confirms rinsing out mouth after each use of Trelegy  Identify patient has multiple in-date albuterol inhalers at home. Patient to follow up with pharmacy about receiving refill of albuterol only when needed . Counsel on importance of smoking cessation o Patient not ready to set quit date today; would like to try quiting around Thanksgiving o Reports currently smoking: ~1/2 cigarette/day  States "I take 2 pulls and put it out" a couple of times during the day o Using nicotine lozenges as needed as directed on package as cessation aid o Discuss strategies to aid with quiting . Address patient's questions regarding health plan OTC benefit and encourage patient to call health plan for further questions   Patient Self Care Activities:  . Patient takes medications as directed with aid of adherence tools. o Using pill packaging from [facility] . Calls pharmacy for medication refills . Patient to attend scheduled medical appointments o Next appointment with PCP on  9/7 o Next appointment with Podiatry on 9/7 . Calls provider office for new concerns or questions  . Patient to check blood sugars and keep log . Patient to check blood pressure daily and keep log  Please see past updates related to this goal by clicking on the "Past Updates" button in the selected goal          Plan  Telephone follow up appointment with care management team member scheduled for: 9/22 at 10:30 am  [name], PharmD, [place] [phone]

## 2020-05-12 NOTE — Patient Instructions (Signed)
Thank you allowing the Chronic Care Management Team to be a part of your care! It was a pleasure speaking with you today!     CCM (Chronic Care Management) Team    [name] RN, MSN, CCM Nurse Care Coordinator  [phone]   [name] PharmD  Clinical Pharmacist  [phone]   [name] LCSW Clinical Social Worker [phone]  Visit Information  Goals Addressed            This Visit's Progress   . PharmD-Medication Adherence       Current Barriers:  . Financial Barriers . Knowledge deficits related to coordination of her own care . Limited social support   Pharmacist Clinical Goal(s):   [name] Over the next 30 days, patient will demonstrate improved medication adherence as evidenced by verbalized understanding of prescribed medication regimen, assistance available, and patient report of adherence  Interventions: . Patient reports Podiatry removed nails from great toes on both feet and reports both continue to be sensitive/uncomfortable. States she would like to know if there is something further that she can do.  ? From review of chart, note procedure occurred on 8/6.  ? Reports has been soaking feet in Epsom salt solution as directed.  ? Reports completed 7-day course of doxycycline as directed and applied gentamicin ointment as directed ? Note patient scheduled for follow up visit with Podiatry on 9/7 ? Encourage patient to follow up with Podiatry with question about removed toenails. Confirms having phone number. . Follow up regarding blood pressure monitoring ? Reports taking lisinopril 5 mg once daily from pill pack ? Reports monitoring home BP with new upper arm monitor ? Last checked on 8/24: 126/84, HR 85  . Counsel on continued importance of blood sugar control  ? Reports taking metformin ER 500 mg twice daily from pill pack ? Reports last checked CBG before breakfast on 8/24: 117 . Counsel on importance of medication adherence o Confirms  continues to take medications as directed from pill packs as received from [place] misses a dose ~ once/week  Encourage patient to start using alarm on phone as additional adherence tool o Confirms using maintenance inhaler (Trelegy) daily and rescue (albuterol) as needed as directed. Confirms rinsing out mouth after each use of Trelegy  Identify patient has multiple in-date albuterol inhalers at home. Patient to follow up with pharmacy about receiving refill of albuterol only when needed . Counsel on importance of smoking cessation o Patient not ready to set quit date today; would like to try quiting around Thanksgiving o Reports currently smoking: ~1/2 cigarette/day  States "I take 2 pulls and put it out" a couple of times during the day o Using nicotine lozenges as needed as directed on package as cessation aid o Discuss strategies to aid with quiting . Address patient's questions regarding health plan OTC benefit and encourage patient to call health plan for further questions   Patient Self Care Activities:  . Patient takes medications as directed with aid of adherence tools. o Using pill packaging from [facility] . Calls pharmacy for medication refills . Patient to attend scheduled medical appointments o Next appointment with PCP on 9/7 o Next appointment with Podiatry on 9/7 . Calls provider office for new concerns or questions  . Patient to check blood sugars and keep log . Patient to check blood pressure daily and keep log  Please see past updates related to this goal by clicking on the "Past Updates" button  in the selected goal          Patient verbalizes understanding of instructions provided today.   Telephone follow up appointment with care management team member scheduled for: 9/22 at 10:30 am  [name], PharmD, [place] [phone]

## 2020-05-25 ENCOUNTER — Telehealth: Payer: Self-pay | Admitting: Family Medicine

## 2020-05-25 ENCOUNTER — Encounter: Payer: Self-pay | Admitting: Family Medicine

## 2020-05-25 ENCOUNTER — Other Ambulatory Visit: Payer: Self-pay

## 2020-05-25 ENCOUNTER — Telehealth (INDEPENDENT_AMBULATORY_CARE_PROVIDER_SITE_OTHER): Payer: Medicare Other | Admitting: Family Medicine

## 2020-05-25 ENCOUNTER — Ambulatory Visit (INDEPENDENT_AMBULATORY_CARE_PROVIDER_SITE_OTHER): Payer: Medicare Other | Admitting: Podiatry

## 2020-05-25 DIAGNOSIS — Z79899 Other long term (current) drug therapy: Secondary | ICD-10-CM

## 2020-05-25 DIAGNOSIS — Z1211 Encounter for screening for malignant neoplasm of colon: Secondary | ICD-10-CM | POA: Insufficient documentation

## 2020-05-25 DIAGNOSIS — M79674 Pain in right toe(s): Secondary | ICD-10-CM

## 2020-05-25 DIAGNOSIS — E0843 Diabetes mellitus due to underlying condition with diabetic autonomic (poly)neuropathy: Secondary | ICD-10-CM

## 2020-05-25 DIAGNOSIS — L603 Nail dystrophy: Secondary | ICD-10-CM | POA: Diagnosis not present

## 2020-05-25 DIAGNOSIS — B351 Tinea unguium: Secondary | ICD-10-CM

## 2020-05-25 DIAGNOSIS — E1169 Type 2 diabetes mellitus with other specified complication: Secondary | ICD-10-CM | POA: Diagnosis not present

## 2020-05-25 DIAGNOSIS — M79675 Pain in left toe(s): Secondary | ICD-10-CM | POA: Diagnosis not present

## 2020-05-25 DIAGNOSIS — E785 Hyperlipidemia, unspecified: Secondary | ICD-10-CM

## 2020-05-25 DIAGNOSIS — Z794 Long term (current) use of insulin: Secondary | ICD-10-CM

## 2020-05-25 DIAGNOSIS — E118 Type 2 diabetes mellitus with unspecified complications: Secondary | ICD-10-CM

## 2020-05-25 DIAGNOSIS — E559 Vitamin D deficiency, unspecified: Secondary | ICD-10-CM

## 2020-05-25 MED ORDER — GENTAMICIN SULFATE 0.1 % EX CREA: 1.0000 "application " | TOPICAL_CREAM | Freq: Two times a day (BID) | CUTANEOUS | 1 refills | Status: DC

## 2020-05-25 NOTE — Patient Instructions (Signed)
As we discussed, have your labs drawn in the next 1-2 weeks and will contact you with the results.  Continue all medications as directed.  You can learn more information online about your diabetes at American Diabetes Association: http://www.diabetes.org/ - General self-care (diet, medications, blood sugar checks). - Diet recommendations - There are even recipes available for you to look at and try.  I have sent in an order for a Cologuard for colon cancer screening.  This will be shipped directly to your house.  If you have any questions on how to complete this, there will be a customer service telephone number   We will plan to see you back in 3 months for diabetes follow up visit  You will receive a survey after today's visit either digitally by e-mail or paper by [place] mail. Your experiences and feedback matter to [place].  Please respond so we know how we are doing as we provide care for you.  Call us with any questions/concerns/needs.  It is my goal to be available to you for your health concerns.  Thanks for choosing me to be a partner in your healthcare needs!  Harlin Rain, FNP-C Family Nurse Practitioner [facility] Phone: [phone]

## 2020-05-25 NOTE — Assessment & Plan Note (Signed)
Status unknown.  Recheck labs.  Followup after labs.  

## 2020-05-25 NOTE — Assessment & Plan Note (Signed)
Reports CBG ranging 82-171 over the past two weeks.  Has continued to take metformin XR 500mg  BID WC.  Not symptomatic by review of systems.  Will have labs drawn in the next 1-2 weeks for assessment of A1C control and f/u in clinic in 3 months.

## 2020-05-25 NOTE — Assessment & Plan Note (Signed)
Was previously interested in a colonoscopy but is not interested in COVID swab testing prior to procedure.  Discussed option of Cologuard, patient interested.  Order placed.

## 2020-05-25 NOTE — Telephone Encounter (Signed)
° °  SF 05/25/2020    Name: Kendra [name]    MRN: [id number]    DOB: December 31, 1945    AGE: 74 y.o.    GENDER: female    PCP [place], [name], FNP.   Called pt regarding [employer] Referral for [employer] and housing resources. Patient stated that she is still in need of Glucerna samples and would like for housing resources to be re-mailed to her. Informed patient that Care Guide will re-send housing resources and contact [employer] to see if they will provide Glucerna samples and/or coupons.    Follow up on: 05/27/2020  [place], Care Management Phone: [phone] Email: [email or web address]

## 2020-05-25 NOTE — Progress Notes (Signed)
Virtual Visit via Telephone Visit  The purpose of this virtual visit is to provide medical care while limiting exposure to the novel coronavirus (COVID19) for both patient and office staff.  Consent was obtained for phone visit:  Yes.   Answered questions that patient had about telehealth interaction:  Yes.   I discussed the limitations, risks, security and privacy concerns of performing an evaluation and management service by telephone. I also discussed with the patient that there may be a patient responsible charge related to this service. The patient expressed understanding and agreed to proceed.  Patient is at home and is accessed via telephone Services are provided by Harlin Rain, FNP-C from [facility])  ---------------------------------------------------------------------- Chief Complaint  Patient presents with  . Diabetes    S: Reviewed CMA documentation. I have called patient and gathered additional HPI as follows:  [name] presents to clinic for telemedicine visit for a follow up on her diabetes.  Reports that she has been checking her AM CBG and they have ranged from 82-171 within the last 2 weeks.    Diabetes Pt presents today for follow up Type 2 Diabetes Mellitus.  He/she (caps): She ACTION; IS/IS NOT: is checking AM CBG at home. -Current diabetic medications include: metformin XR 567m BID WC -ACTION; IS/IS NOT: is not currently symptomatic -Actions; denies/reports/admits to: denies polydipsia, polyphagia, polyuria, headaches, diaphoresis, shakiness, chills, pain, numbness or tingling in extremities or changes in vision -Clinical course TBD based on pending A1C labs -Reports no exercise routine -Diet is high in salt, high in fat, and high in carbohydrates  PREVENTION Eye exam current (within 1 year) Up to date Foot exam current (within 1 year) Up to date Lipid/ASCVD risk reduction - on statin: YES/NO: Yes  Kidney Protection (On  ACE/ARB)? YES/NO: Yes   Patient is currently home Denies any high risk travel to areas of current concern for COVID19. Denies any known or suspected exposure to person with or possibly with COVID19.  Past Medical History:  Diagnosis Date  . Asthma   . Back pain   . Cataract   . Cervical disc disorder   . COPD (chronic obstructive pulmonary disease) (Kendra [name]   . Depression   . Diabetes (HGlen [name]    type II  . Diabetic neuropathy (Kendra [name]   . Dyspnea    with exertion  . GERD (gastroesophageal reflux disease)   . History of kidney stones    per patient "can't remember the year"  . Hyperlipidemia   . Hypertension    per patient "take meds for it"  . Insomnia   . Neuropathy   . Osteoarthritis   . Osteopenia   . Pneumonia 2018   per patient  . Reflux   . Rheumatoid arthritis (Kendra [name]   . Stroke (Kendra [name] 2015   and again 2017  - Weakness in left leg, staggers w/ walking, vision   . Tenosynovitis    Social History   Tobacco Use  . Smoking status: Current Some Day Smoker    Types: Cigarettes  . Smokeless tobacco: Former [employer] developer   Types: Snuff  . Tobacco comment: .5 of 1 cigarette a day  Vaping Use  . Vaping Use: Never used  Substance Use Topics  . Alcohol use: No  . Drug use: No    Current Outpatient Medications:  .  albuterol (VENTOLIN HFA) 108 (90 Base) MCG/ACT inhaler, INHALE 1-2 PUFFS EVERY 6 HOURS AS NEEDEDSHORTNESS OF BREATH AND WHEEZING, Disp: 8.5 g, Rfl: 1 .  aspirin 325 MG tablet, Take 325 mg by mouth daily., Disp: , Rfl:  .  atorvastatin (LIPITOR) 40 MG tablet, TAKE 1 TABLET BY MOUTH ONCE DAILY, Disp: 90 tablet, Rfl: 0 .  Blood Glucose Monitoring Suppl (ONETOUCH VERIO) w/Device KIT, USE AS DIRECTED, Disp: 1 kit, Rfl: 0 .  buPROPion (WELLBUTRIN XL) 150 MG 24 hr tablet, TAKE 1 TABLET BY MOUTH ONCE DAILY, Disp: 90 tablet, Rfl: 0 .  Cholecalciferol (VITAMIN D3) 50 MCG (2000 UT) capsule, Take 1 capsule (2,000 Units total) by mouth daily., Disp: 90 capsule, Rfl: 1 .  clopidogrel  (PLAVIX) 75 MG tablet, TAKE 1 TABLET BY MOUTH ONCE DAILY, Disp: 90 tablet, Rfl: 0 .  diclofenac Sodium (VOLTAREN) 1 % GEL, APPLY 2 GRAMS TOPICALLY 4 TIMES DAILY, Disp: 100 g, Rfl: 0 .  gentamicin cream (GARAMYCIN) 0.1 %, Apply 1 application topically 2 (two) times daily., Disp: 30 g, Rfl: 1 .  glucose blood (ONETOUCH VERIO) test strip, Use as instructed, Disp: 200 each, Rfl: 4 .  Lancet Devices (ONE TOUCH DELICA LANCING DEV) MISC, 1 Device by Does not apply route 2 (two) times daily., Disp: 200 each, Rfl: 4 .  lisinopril (ZESTRIL) 5 MG tablet, Take 1 tablet (5 mg total) by mouth daily., Disp: 90 tablet, Rfl: 3 .  metFORMIN (GLUCOPHAGE-XR) 500 MG 24 hr tablet, TAKE 1 TABLET BY MOUTH TWICE DAILY BEFORE A MEAL, Disp: 60 tablet, Rfl: 3 .  mupirocin ointment (BACTROBAN) 2 %, Apply 1 application topically daily., Disp: 22 g, Rfl: 0 .  nicotine polacrilex (COMMIT) 4 MG lozenge, Take 4 mg by mouth as needed for smoking cessation., Disp: , Rfl:  .  ondansetron (ZOFRAN) 4 MG tablet, Take 2 tablets (8 mg total) by mouth every 8 (eight) hours as needed for nausea or vomiting., Disp: 20 tablet, Rfl: 0 .  OneTouch Delica Lancets 16X MISC, 1 Device by Does not apply route 2 (two) times daily., Disp: 200 each, Rfl: 4 .  TRELEGY ELLIPTA 100-62.5-25 MCG/INH AEPB, INHALE 1 PUFF INTO THE LUNGS ONCE DAILY, Disp: 60 each, Rfl: 3  Depression screen [facility] 2/9 11/04/2019 09/22/2019 04/30/2019  Decreased Interest 0 3 3  Down, Depressed, Hopeless _0 PHQ - 2 Score _1 Altered sleeping 0 0 2  Tired, decreased energy 0 0 1  Change in appetite 1 0 0  Feeling bad or failure about yourself  0 0 0  Trouble concentrating 0 0 0  Moving slowly or fidgety/restless 0 0 1  Suicidal thoughts 0 0 0  PHQ-9 Score _2 Difficult doing work/chores Not difficult at all Not difficult at all Not difficult at all  Some recent data might be hidden    GAD 7 : Generalized Anxiety Score 09/22/2019 04/30/2019 02/17/2019  Nervous, Anxious, on  Edge 2 0 (No Data)  Control/stop worrying 3 0 -  Worry too much - different things 3 2 -  Trouble relaxing 0 0 -  Restless 2 1 -  Easily annoyed or irritable 3 3 -  Afraid - awful might happen 1 0 -  Total GAD 7 Score 14 6 -  Anxiety Difficulty Not difficult at all Not difficult at all -    -------------------------------------------------------------------------- O: No physical exam performed due to remote telephone encounter.  Physical Exam: Patient remotely monitored without video.  Verbal communication appropriate.  Cognition normal.  No results found for this or any previous visit (from the past 2160 hour(s)).  -------------------------------------------------------------------------- A&P:  Problem  List Items Addressed This Visit      Endocrine   Hyperlipidemia associated with type 2 diabetes mellitus ([place])    Stable on current treatment of atorvastatin 59m daily.  Will continue.      Diabetes mellitus type 2, controlled, with complications ([place] [facility]    Reports CBG ranging 82-171 over the past two weeks.  Has continued to take metformin XR 5047mBID WC.  Not symptomatic by review of systems.  Will have labs drawn in the next 1-2 weeks for assessment of A1C control and f/u in clinic in 3 months.      Relevant Orders   CBC with Differential   COMPLETE METABOLIC PANEL WITH GFR   HgB A1c     Other   Vitamin D deficiency    Status unknown.  Recheck labs.  Followup after labs.       Relevant Orders   Vitamin D (25 hydroxy)   Colon cancer screening    Was previously interested in a colonoscopy but is not interested in COVID swab testing prior to procedure.  Discussed option of Cologuard, patient interested.  Order placed.      Relevant Orders   Cologuard    Other Visit Diagnoses    Long-term use of high-risk medication          No orders of the defined types were placed in this encounter.   Follow-up: - Return in 3 months for diabetes and  hypertension follow up visit - Have labs drawn in clinic in the next 1-2 weeks  Patient verbalizes understanding with the above medical recommendations including the limitation of remote medical advice.  Specific follow-up and call-back criteria were given for patient to follow-up or seek medical care more urgently if needed.  - Time spent in direct consultation with patient on phone: 9 minutes  NiHarlin RainFNLewisburgroup 05/25/2020, 11:35 AM

## 2020-05-25 NOTE — Assessment & Plan Note (Signed)
Stable on current treatment of atorvastatin 40mg  daily.  Will continue.

## 2020-05-25 NOTE — Progress Notes (Signed)
   HPI: 74 y.o. female presenting today for follow-up evaluation of total permanent nail avulsions performed to the bilateral great toes on 04/23/2020.  Patient states that she is doing very well.  She does have some discomfort to the right great toe because she hit it against some furniture this morning.  Otherwise she is doing very well and wearing sandals.  She took the oral antibiotic for prophylaxis as directed and has been applying antibiotic cream and a Band-Aid daily to the toes.  No new complaints at this time.  Past Medical History:  Diagnosis Date  . Asthma   . Back pain   . Cataract   . Cervical disc disorder   . COPD (chronic obstructive pulmonary disease) ([place])   . Depression   . Diabetes ([place])    type II  . Diabetic neuropathy ([place])   . Dyspnea    with exertion  . GERD (gastroesophageal reflux disease)   . History of kidney stones    per patient "can't remember the year"  . Hyperlipidemia   . Hypertension    per patient "take meds for it"  . Insomnia   . Neuropathy   . Osteoarthritis   . Osteopenia   . Pneumonia 2018   per patient  . Reflux   . Rheumatoid arthritis ([place])   . Stroke ([place]) 2015   and again 2017  - Weakness in left leg, staggers w/ walking, vision   . Tenosynovitis      Physical Exam: General: The patient is alert and oriented x3 in no acute distress.  Dermatology: Skin is warm, dry and supple bilateral lower extremities. Negative for open lesions or macerations.  Nail avulsion sites the bilateral great toes appear stable with a granular wound base.  Negative for any significant drainage.  No erythema or edema around the area.  No malodor noted.  Open wounds noted to the bilateral great toe nail beds.  Vascular: Palpable pedal pulses bilaterally. No edema or erythema noted. Capillary refill within normal limits.  Neurological: Epicritic and protective threshold diminished bilaterally.   Musculoskeletal Exam: Range of motion within normal limits  to all pedal and ankle joints bilateral. Muscle strength 5/5 in all groups bilateral.   Assessment: 1.  Status post total permanent nail avulsions bilateral great toes   Plan of Care:  1. Patient evaluated.  2.  Light debridement was performed.  Antibiotic ointment and a Band-Aid was applied to the bilateral great toes 3.  Recommend antibiotic cream and a Band-Aid daily for an additional 3 weeks. 4.  Refill for gentamicin cream sent to the pharmacy 5.  Return to clinic in 4 weeks for final follow-up visit      Kendra [name], DPM [facility]  Dr. Edrick [name], DPM    [address], [place] [address]                Office [phone]  Fax [phone]

## 2020-05-27 ENCOUNTER — Ambulatory Visit (INDEPENDENT_AMBULATORY_CARE_PROVIDER_SITE_OTHER): Payer: Medicare Other | Admitting: General Practice

## 2020-05-27 ENCOUNTER — Telehealth: Payer: Self-pay | Admitting: General Practice

## 2020-05-27 DIAGNOSIS — E1159 Type 2 diabetes mellitus with other circulatory complications: Secondary | ICD-10-CM

## 2020-05-27 DIAGNOSIS — I10 Essential (primary) hypertension: Secondary | ICD-10-CM | POA: Diagnosis not present

## 2020-05-27 DIAGNOSIS — E785 Hyperlipidemia, unspecified: Secondary | ICD-10-CM

## 2020-05-27 DIAGNOSIS — Z794 Long term (current) use of insulin: Secondary | ICD-10-CM

## 2020-05-27 DIAGNOSIS — J432 Centrilobular emphysema: Secondary | ICD-10-CM | POA: Diagnosis not present

## 2020-05-27 DIAGNOSIS — E1169 Type 2 diabetes mellitus with other specified complication: Secondary | ICD-10-CM | POA: Diagnosis not present

## 2020-05-27 DIAGNOSIS — I152 Hypertension secondary to endocrine disorders: Secondary | ICD-10-CM

## 2020-05-27 NOTE — Patient Instructions (Signed)
Visit Information  Goals Addressed              This Visit's Progress     RNCM-Pt "My blood pressures have been stable" (pt-stated)        Current Barriers:   Knowledge Deficits related to basic understanding of hypertension pathophysiology and self care management  Knowledge Deficits related to understanding of medications prescribed for management of hypertension  Non-adherence to prescribed medication regimen  Limited ability to follow a low sodium diet  Financial Constraints.   Transportation barrier  Case Manager Clinical Goal(s):   Over the next 90 days, patient will demonstrate improved adherence to prescribed treatment plan for hypertension as evidenced by taking all medications as prescribed, monitoring and recording blood pressure as directed, adhering to low sodium/DASH diet  Over the next 90 days the patient will utilize the [employer] Benefit with the ability to prepare heart healthy/low sodium diet in home setting  Over the next 30 days the patient will have follow up appointments with pcp and surgeon post right endarterectomy and hospitalization recently- completed  Interventions:   Evaluation of current treatment plan related to hypertension self management and patient's adherence to plan as established by provider. The patient states her headaches have resolved at this time.   Discussed plans with patient for ongoing care management follow up and provided patient with direct contact information for care management team  Advised patient, providing education and rationale, to monitor blood pressure daily and record, the patient stated the pcp told her to go to the ED for SBP>199, see updated vital sign record for readings over the last several days  Advised patient to call [facility] for the "[employer] Benefit" ($55.00) credit per month for buying food   Patient continues to check b/p BID and is following pharmacy recommendations to recheck after 37mins if it  is high. The patient is keeping a log and can tell the RNCM her readings.  05-27-2020: See VS log for several readings provided by the patient today. Blood pressure this am was 100/77 with pulse of 85. Praised for taking and recording.  o BP Readings from Last 3 Encounters: o  05/27/20 o 100/77 o  04/05/20 o 109/72 o  01/29/20 o 119/61 o     Evaluated heart healthy diet with the patient. The patient verbalized she will be getting 15 meals a week that are low sodium and this switch will take place at the end of the week. 05-27-2020: The patient continue to get meals and this is working well for her.   Care guide referral for assistance and help with transportation needs related to a podiatry appointment on 02/10/2020.  The patient waited to late to call and get an appointment. Education on keeping a calendar with appointments so she can call in plenty of time to schedule transportation. Explained her appointment with podiatry may need to be rescheduled.   Evaluation of heart healthy/ADA diet. The patient states that she is doing well with her diet. Praised for positive changes in health and well being.            Patient Self Care Activities:   UNABLE to independently manage worsening headaches  Checks BP and records as discussed  Please see past updates related to this goal by clicking on the "Past Updates" button in the selected goal         RNCM: pt-"I feel like finding a new place to live will help me with my health" (pt-stated)  CARE PLAN ENTRY (see longtitudinal plan of care for additional care plan information)  Current Barriers:   Chronic Disease Management support, education, and care coordination needs related to HLD and Depression  Clinical Goal(s) related to HLD and Depression:  Over the next 120 days, patient will:   Work with the care management team to address educational, disease management, and care coordination needs   Begin or continue self health  monitoring activities as directed today  adhere to heart healthy/ADA diet  Call provider office for new or worsened signs and symptoms New or worsened symptom related to HLD/Depression and other chronic conditions  Call care management team with questions or concerns  Verbalize basic understanding of patient centered plan of care established today  Interventions related to HLD and Depression:   Evaluation of current treatment plans and patient's adherence to plan as established by provider.  The patient states she is doing well but still needs a new place to live. 05-27-2020: Had a video visit with the pcp on 05-25-2020 and states it went well. She endorses good blood pressures and blood sugars. The patient states she is supposed to come to the office and have blood work drawn soon to get A1C checked and also kidney function. She follows up with pcp on 08-26-2020.   Assessed patient understanding of disease states.  The patient feels she is doing very well at this time. States her blood pressure is stable and no new concerns. 05-27-2020: Blood pressures recorded from several days in VS. Range has been 967-893 systolic and 81-01 diastolic with pulse 83 to 99.  She states her blood sugars have been good also. The range has been 107 to 149 with a reading of 139 this am at 0955 am.  The patient praised for taking regularly and recording. She has been working hard to maintain her health and well being.   Assessed patient's education and care coordination needs. 05-27-2020: The patient is still looking for housing. The care guides have worked with her in the past. They have re-mailed her paperwork for housing in the area.  They have also sent information to the [employer] representative about getting her some glucerna. She had ask her granddaughter to take her OTC card to get her some and they would not take it at the pharmacy. Explained that she would need to use it at [facility] or order on line. The patient verbalized  understanding.   Provided disease specific education to patient.  Education on smoking cessation and the benefit of smoking cessation on her overall health and well being. The patient states she has cut down considerably. Education and support.   Collaborated with appropriate clinical care team members regarding patient needs  Assessed housing situation. The patient feels she needs to find a new place to live.  She feels safe in her current environment but stays to herself. When talking to the LCSW last week she expressed wanting help with finding a new place to live. A care guide referral has been placed to help with finding new housing. 03-29-2020: The patient states she did not receive the housing information. Looked at the referral and the information was sent to her sons email address. The patient ask if the information could be sent to her mailing address. A new referral sent to the care guides to see if a copy of the resources could be mailed to her mailing address. She does not want another apartment.  Education and support given.   Patient Self Care  Activities related to HLD and Depression:   Patient is unable to independently self-manage chronic health conditions  Please see past updates related to this goal by clicking on the "Past Updates" button in the selected goal         Patient verbalizes understanding of instructions provided today.   Telephone follow up appointment with care management team member scheduled for: 08-05-2020 at 10:15 am  [name] RN, MSN, [place] [facility] Mobile: [phone]

## 2020-05-27 NOTE — Chronic Care Management (AMB) (Signed)
Chronic Care Management   Follow Up Note   05/27/2020 Name: Kendra [name] MRN: [id number] DOB: [birth year]  Referred by: [name], FNP Reason for referral : Chronic Care Management (RNCM Follow up Call for Chronic Disease Management and Care Coordination Needs)   Kendra [name] is a 74 y.o. year old female who is a primary care patient of [name], [place]. The CCM team was consulted for assistance with chronic disease management and care coordination needs.    Review of patient status, including review of consultants reports, relevant laboratory and other test results, and collaboration with appropriate care team members and the patient's provider was performed as part of comprehensive patient evaluation and provision of chronic care management services.    SDOH (Social Determinants of Health) assessments performed: Yes See Care Plan activities for detailed interventions related to [facility])     Outpatient Encounter Medications as of 05/27/2020  Medication Sig  . albuterol (VENTOLIN HFA) 108 (90 Base) MCG/ACT inhaler INHALE 1-2 PUFFS EVERY 6 HOURS AS NEEDEDSHORTNESS OF BREATH AND WHEEZING  . aspirin 325 MG tablet Take 325 mg by mouth daily.  [name] atorvastatin (LIPITOR) 40 MG tablet TAKE 1 TABLET BY MOUTH ONCE DAILY  . Blood Glucose Monitoring Suppl (ONETOUCH VERIO) w/Device KIT USE AS DIRECTED  . buPROPion (WELLBUTRIN XL) 150 MG 24 hr tablet TAKE 1 TABLET BY MOUTH ONCE DAILY  . Cholecalciferol (VITAMIN D3) 50 MCG (2000 UT) capsule Take 1 capsule (2,000 Units total) by mouth daily.  . clopidogrel (PLAVIX) 75 MG tablet TAKE 1 TABLET BY MOUTH ONCE DAILY  . diclofenac Sodium (VOLTAREN) 1 % GEL APPLY 2 GRAMS TOPICALLY 4 TIMES DAILY  . gentamicin cream (GARAMYCIN) 0.1 % Apply 1 application topically 2 (two) times daily.  [name] glucose blood (ONETOUCH VERIO) test strip Use as instructed  . Lancet Devices (ONE TOUCH DELICA LANCING DEV) MISC 1 Device by Does not apply route 2 (two) times  daily.  [name] lisinopril (ZESTRIL) 5 MG tablet Take 1 tablet (5 mg total) by mouth daily.  . metFORMIN (GLUCOPHAGE-XR) 500 MG 24 hr tablet TAKE 1 TABLET BY MOUTH TWICE DAILY BEFORE A MEAL  . mupirocin ointment (BACTROBAN) 2 % Apply 1 application topically daily.  . nicotine polacrilex (COMMIT) 4 MG lozenge Take 4 mg by mouth as needed for smoking cessation.  . ondansetron (ZOFRAN) 4 MG tablet Take 2 tablets (8 mg total) by mouth every 8 (eight) hours as needed for nausea or vomiting.  [name] Lancets 49Z MISC 1 Device by Does not apply route 2 (two) times daily.  . TRELEGY ELLIPTA 100-62.5-25 MCG/INH AEPB INHALE 1 PUFF INTO THE LUNGS ONCE DAILY   No facility-administered encounter medications on file as of 05/27/2020.     Objective:   Goals Addressed              This Visit's Progress   .  RNCM-Pt "My blood pressures have been stable" (pt-stated)        Current Barriers:  [name] Knowledge Deficits related to basic understanding of hypertension pathophysiology and self care management . Knowledge Deficits related to understanding of medications prescribed for management of hypertension . Non-adherence to prescribed medication regimen . Limited ability to follow a low sodium diet . Film/video editor.  . Transportation barrier  Case Manager Clinical Goal(s):  [name] Over the next 90 days, patient will demonstrate improved adherence to prescribed treatment plan for hypertension as evidenced by taking all medications as prescribed, monitoring and recording blood  pressure as directed, adhering to low sodium/DASH diet . Over the next 90 days the patient will utilize the [employer] Benefit with the ability to prepare heart healthy/low sodium diet in home setting . Over the next 30 days the patient will have follow up appointments with pcp and surgeon post right endarterectomy and hospitalization recently- completed  Interventions:  . Evaluation of current treatment plan related to  hypertension self management and patient's adherence to plan as established by provider. The patient states her headaches have resolved at this time.  . Discussed plans with patient for ongoing care management follow up and provided patient with direct contact information for care management team . Advised patient, providing education and rationale, to monitor blood pressure daily and record, the patient stated the pcp told her to go to the ED for SBP>199, see updated vital sign record for readings over the last several days . Advised patient to call [name] for the "Healthy Foods Benefit" ($55.00) credit per month for buying food  . Patient continues to check b/p BID and is following pharmacy recommendations to recheck after 57mns if it is high. The patient is keeping a log and can tell the RNCM her readings.  05-27-2020: See VS log for several readings provided by the patient today. Blood pressure this am was 100/77 with pulse of 85. Praised for taking and recording.  o BP Readings from Last 3 Encounters: o  05/27/20 o 100/77 o  04/05/20 o 109/72 o  01/29/20 o 119/61 o    . Evaluated heart healthy diet with the patient. The patient verbalized she will be getting 15 meals a week that are low sodium and this switch will take place at the end of the week. 05-27-2020: The patient continue to get meals and this is working well for her.  . Care guide referral for assistance and help with transportation needs related to a podiatry appointment on 02/10/2020.  The patient waited to late to call and get an appointment. Education on keeping a calendar with appointments so she can call in plenty of time to schedule transportation. Explained her appointment with podiatry may need to be rescheduled.  . Evaluation of heart healthy/ADA diet. The patient states that she is doing well with her diet. Praised for positive changes in health and well being.            Patient Self Care Activities:  . UNABLE to  independently manage worsening headaches . Checks BP and records as discussed  Please see past updates related to this goal by clicking on the "Past Updates" button in the selected goal       .  RNCM: pt-"I feel like finding a new place to live will help me with my health" (pt-stated)        [place](see longtitudinal plan of care for additional care plan information)  Current Barriers:  . Chronic Disease Management support, education, and care coordination needs related to HLD and Depression  Clinical Goal(s) related to HLD and Depression:  Over the next 120 days, patient will:  . Work with the care management team to address educational, disease management, and care coordination needs  . Begin or continue self health monitoring activities as directed today  adhere to heart healthy/ADA diet . Call provider office for new or worsened signs and symptoms New or worsened symptom related to HLD/Depression and other chronic conditions . Call care management team with questions or concerns . Verbalize basic understanding of patient  centered plan of care established today  Interventions related to HLD and Depression:  . Evaluation of current treatment plans and patient's adherence to plan as established by provider.  The patient states she is doing well but still needs a new place to live. 05-27-2020: Had a video visit with the pcp on 05-25-2020 and states it went well. She endorses good blood pressures and blood sugars. The patient states she is supposed to come to the office and have blood work drawn soon to get A1C checked and also kidney function. She follows up with pcp on 08-26-2020.  . Assessed patient understanding of disease states.  The patient feels she is doing very well at this time. States her blood pressure is stable and no new concerns. 05-27-2020: Blood pressures recorded from several days in VS. Range has been 224-825 systolic and 00-37 diastolic with pulse 83 to 99.  She states her  blood sugars have been good also. The range has been 107 to 149 with a reading of 139 this am at 0955 am.  The patient praised for taking regularly and recording. She has been working hard to maintain her health and well being.  . Assessed patient's education and care coordination needs. 05-27-2020: The patient is still looking for housing. The care guides have worked with her in the past. They have re-mailed her paperwork for housing in the area.  They have also sent information to the [employer] representative about getting her some glucerna. She had ask her granddaughter to take her OTC card to get her some and they would not take it at the pharmacy. Explained that she would need to use it at [facility] or order on line. The patient verbalized understanding.  . Provided disease specific education to patient.  Education on smoking cessation and the benefit of smoking cessation on her overall health and well being. The patient states she has cut down considerably. Education and support.  Nash Dimmer with appropriate clinical care team members regarding patient needs . Assessed housing situation. The patient feels she needs to find a new place to live.  She feels safe in her current environment but stays to herself. When talking to the LCSW last week she expressed wanting help with finding a new place to live. A care guide referral has been placed to help with finding new housing. 03-29-2020: The patient states she did not receive the housing information. Looked at the referral and the information was sent to her sons email address. The patient ask if the information could be sent to her mailing address. A new referral sent to the care guides to see if a copy of the resources could be mailed to her mailing address. She does not want another apartment.  Education and support given.   Patient Self Care Activities related to HLD and Depression:  . Patient is unable to independently self-manage chronic health  conditions  Please see past updates related to this goal by clicking on the "Past Updates" button in the selected goal          Plan:   Telephone follow up appointment with care management team member scheduled for: 08-05-2020 at 10:15am   [name] RN, MSN, [place] Mobile: [phone]

## 2020-05-28 NOTE — Telephone Encounter (Signed)
Spoke with patient regarding referral.  Patient stated that she is still in need of housing resources and would like for the information that has already been sent to her (via her son's e-mail) to be mailed to her. Sent e-mail to [employer]) at [employer] to print and mail letter to patient. Also sent messages to [employer] on 05/20/2020 and 05/25/2020 regarding Glucerna and have not heard back from the organization to see if they provide the product. Referral will be closed for now, but patient will be updated once Care Guide receives information from [employer].   Closing referral pending any other needs of patient.

## 2020-06-09 ENCOUNTER — Ambulatory Visit: Payer: Self-pay | Admitting: Pharmacist

## 2020-06-09 DIAGNOSIS — E118 Type 2 diabetes mellitus with unspecified complications: Secondary | ICD-10-CM

## 2020-06-09 DIAGNOSIS — E1159 Type 2 diabetes mellitus with other circulatory complications: Secondary | ICD-10-CM | POA: Diagnosis not present

## 2020-06-09 DIAGNOSIS — E785 Hyperlipidemia, unspecified: Secondary | ICD-10-CM | POA: Diagnosis not present

## 2020-06-09 DIAGNOSIS — I10 Essential (primary) hypertension: Secondary | ICD-10-CM | POA: Diagnosis not present

## 2020-06-09 DIAGNOSIS — Z794 Long term (current) use of insulin: Secondary | ICD-10-CM

## 2020-06-09 DIAGNOSIS — J432 Centrilobular emphysema: Secondary | ICD-10-CM | POA: Diagnosis not present

## 2020-06-09 DIAGNOSIS — E1169 Type 2 diabetes mellitus with other specified complication: Secondary | ICD-10-CM

## 2020-06-09 DIAGNOSIS — I152 Hypertension secondary to endocrine disorders: Secondary | ICD-10-CM

## 2020-06-09 DIAGNOSIS — F17200 Nicotine dependence, unspecified, uncomplicated: Secondary | ICD-10-CM

## 2020-06-09 NOTE — Patient Instructions (Signed)
Thank you allowing the Chronic Care Management Team to be a part of your care! It was a pleasure speaking with you today!     CCM (Chronic Care Management) Team    [name] RN, MSN, CCM Nurse Care Coordinator  [phone]   [name] PharmD  Clinical Pharmacist  [phone]   [name] LCSW Clinical Social Worker [phone]  Visit Information  Goals Addressed            This Visit's Progress   . PharmD-Medication Adherence       Current Barriers:  . Financial Barriers . Knowledge deficits related to coordination of her own care . Limited social support   Pharmacist Clinical Goal(s):   [name] Over the next 30 days, patient will demonstrate improved medication adherence as evidenced by verbalized understanding of prescribed medication regimen, assistance available, and patient report of adherence  Interventions: . Follow up regarding blood pressure monitoring ? Reports taking lisinopril 5 mg once daily from pill pack ? Reports monitoring home BP with upper arm monitor ? Last checked on 9/15: 125/86, HR 84 . Counsel on continued importance of blood sugar control  ? Reports taking metformin ER 500 mg twice daily from pill pack ? Reports recent morning CBGs typically ranging: 100-120s . Counsel on importance of medication adherence o Confirms continues to take medications as directed from pill packs as received from [place] has called to reorder, next pack arriving on Monday  Encourage patient to start using afternoon alarm on phone as additional adherence tool o Confirms using maintenance inhaler (Trelegy) daily and rescue (albuterol) as needed as directed.  [name] on importance of smoking cessation o Patient not ready to set quit date today; would like to try quiting around Thanksgiving o Reports currently smoking: ~1/2 cigarette/day  States "I take 2 pulls and put it out" a couple of times during the day o Using nicotine lozenges as  needed as directed on package as cessation aid . Coorination of care: follow up regarding ordered lab work due o Reports planning to come to office to have labs drawn, but needing transportation  Advise patient to call health plan to arrange ride. Patient confirms having phone number and states will call [employer] transportation today.  Patient Self Care Activities:  . Patient takes medications as directed with aid of adherence tools. o Using pill packaging from [facility] . Calls pharmacy for medication refills . Patient to attend scheduled medical appointments . Calls provider office for new concerns or questions  . Patient to check blood sugars and keep log . Patient to check blood pressure daily and keep log  Please see past updates related to this goal by clicking on the "Past Updates" button in the selected goal          Patient verbalizes understanding of instructions provided today.   Telephone follow up appointment with care management team member scheduled for: 10/27 at 9:45 am  [name], PharmD, [place] [phone]

## 2020-06-09 NOTE — Chronic Care Management (AMB) (Signed)
Chronic Care Management   Follow Up Note   06/09/2020 Name: Kendra [name] MRN: [id number] DOB: 09-04-1946  Referred by: [name], FNP Reason for referral : Chronic Care Management (Patient Phone Call)   TARRYN BOGDAN is a 74 y.o. year old female who is a primary care patient of [name], [place]. The CCM team was consulted for assistance with chronic disease management and care coordination needs.    I reached out to [name] by phone today.   Review of patient status, including review of consultants reports, relevant laboratory and other test results, and collaboration with appropriate care team members and the patient's provider was performed as part of comprehensive patient evaluation and provision of chronic care management services.    SDOH (Social Determinants of Health) assessments performed: No See Care Plan activities for detailed interventions related to [facility])     Outpatient Encounter Medications as of 06/09/2020  Medication Sig  . lisinopril (ZESTRIL) 5 MG tablet Take 1 tablet (5 mg total) by mouth daily.  . metFORMIN (GLUCOPHAGE-XR) 500 MG 24 hr tablet TAKE 1 TABLET BY MOUTH TWICE DAILY BEFORE A MEAL  . TRELEGY ELLIPTA 100-62.5-25 MCG/INH AEPB INHALE 1 PUFF INTO THE LUNGS ONCE DAILY  . albuterol (VENTOLIN HFA) 108 (90 Base) MCG/ACT inhaler INHALE 1-2 PUFFS EVERY 6 HOURS AS NEEDEDSHORTNESS OF BREATH AND WHEEZING  . aspirin 325 MG tablet Take 325 mg by mouth daily.  [name] atorvastatin (LIPITOR) 40 MG tablet TAKE 1 TABLET BY MOUTH ONCE DAILY  . Blood Glucose Monitoring Suppl (ONETOUCH VERIO) w/Device KIT USE AS DIRECTED  . buPROPion (WELLBUTRIN XL) 150 MG 24 hr tablet TAKE 1 TABLET BY MOUTH ONCE DAILY  . Cholecalciferol (VITAMIN D3) 50 MCG (2000 UT) capsule Take 1 capsule (2,000 Units total) by mouth daily.  . clopidogrel (PLAVIX) 75 MG tablet TAKE 1 TABLET BY MOUTH ONCE DAILY  . diclofenac Sodium (VOLTAREN) 1 % GEL APPLY 2 GRAMS TOPICALLY 4 TIMES DAILY    . gentamicin cream (GARAMYCIN) 0.1 % Apply 1 application topically 2 (two) times daily.  [name] glucose blood (ONETOUCH VERIO) test strip Use as instructed  . Lancet Devices (ONE TOUCH DELICA LANCING DEV) MISC 1 Device by Does not apply route 2 (two) times daily.  . mupirocin ointment (BACTROBAN) 2 % Apply 1 application topically daily.  . nicotine polacrilex (COMMIT) 4 MG lozenge Take 4 mg by mouth as needed for smoking cessation.  . ondansetron (ZOFRAN) 4 MG tablet Take 2 tablets (8 mg total) by mouth every 8 (eight) hours as needed for nausea or vomiting.  [name] Lancets 75Z MISC 1 Device by Does not apply route 2 (two) times daily.   No facility-administered encounter medications on file as of 06/09/2020.    Goals Addressed            This Visit's Progress   . PharmD-Medication Adherence       Current Barriers:  . Financial Barriers . Knowledge deficits related to coordination of her own care . Limited social support   Pharmacist Clinical Goal(s):   [name] Over the next 30 days, patient will demonstrate improved medication adherence as evidenced by verbalized understanding of prescribed medication regimen, assistance available, and patient report of adherence  Interventions: . Follow up regarding blood pressure monitoring ? Reports taking lisinopril 5 mg once daily from pill pack ? Reports monitoring home BP with upper arm monitor ? Last checked on 9/15: 125/86, HR 84 . Counsel on continued importance  of blood sugar control  ? Reports taking metformin ER 500 mg twice daily from pill pack ? Reports recent morning CBGs typically ranging: 100-120s . Counsel on importance of medication adherence o Confirms continues to take medications as directed from pill packs as received from [place] has called to reorder, next pack arriving on Monday  Encourage patient to start using afternoon alarm on phone as additional adherence tool o Confirms using maintenance inhaler  (Trelegy) daily and rescue (albuterol) as needed as directed.  [name] on importance of smoking cessation o Patient not ready to set quit date today; would like to try quiting around Thanksgiving o Reports currently smoking: ~1/2 cigarette/day  States "I take 2 pulls and put it out" a couple of times during the day o Using nicotine lozenges as needed as directed on package as cessation aid . Coorination of care: follow up regarding ordered lab work due o Reports planning to come to office to have labs drawn, but needing transportation  Advise patient to call health plan to arrange ride. Patient confirms having phone number and states will call [employer] transportation today.  Patient Self Care Activities:  . Patient takes medications as directed with aid of adherence tools. o Using pill packaging from [facility] . Calls pharmacy for medication refills . Patient to attend scheduled medical appointments . Calls provider office for new concerns or questions  . Patient to check blood sugars and keep log . Patient to check blood pressure daily and keep log  Please see past updates related to this goal by clicking on the "Past Updates" button in the selected goal          Plan  Telephone follow up appointment with care management team member scheduled for: 10/27 at 9:45 am  [name], PharmD, [place] [phone]

## 2020-06-11 ENCOUNTER — Other Ambulatory Visit: Payer: Self-pay

## 2020-06-14 ENCOUNTER — Other Ambulatory Visit: Payer: Self-pay | Admitting: Family Medicine

## 2020-06-14 ENCOUNTER — Ambulatory Visit: Payer: Medicare Other | Admitting: Family Medicine

## 2020-06-14 DIAGNOSIS — Z79899 Other long term (current) drug therapy: Secondary | ICD-10-CM | POA: Diagnosis not present

## 2020-06-14 DIAGNOSIS — J449 Chronic obstructive pulmonary disease, unspecified: Secondary | ICD-10-CM

## 2020-06-14 DIAGNOSIS — E118 Type 2 diabetes mellitus with unspecified complications: Secondary | ICD-10-CM | POA: Diagnosis not present

## 2020-06-14 DIAGNOSIS — E1169 Type 2 diabetes mellitus with other specified complication: Secondary | ICD-10-CM | POA: Diagnosis not present

## 2020-06-14 DIAGNOSIS — E559 Vitamin D deficiency, unspecified: Secondary | ICD-10-CM | POA: Diagnosis not present

## 2020-06-14 DIAGNOSIS — J432 Centrilobular emphysema: Secondary | ICD-10-CM

## 2020-06-14 DIAGNOSIS — E785 Hyperlipidemia, unspecified: Secondary | ICD-10-CM | POA: Diagnosis not present

## 2020-06-14 NOTE — Telephone Encounter (Signed)
Requested medication (s) are due for refill today: yes  Requested medication (s) are on the active medication list: yes  Last refill:  01/18/20  Future visit scheduled: yes  Notes to clinic:  med not assigned to a protocol   Requested Prescriptions  Pending Prescriptions Disp [place] 100-62.5-25 MCG/INH AEPB [Pharmacy Med Name: TRELEGY ELLIPTA 100-62.5-25 MCG/INH] 60 each     Sig: INHALE 1 PUFF INTO THE LUNGS ONCE DAILY      Off-Protocol Failed - 06/14/2020  1:48 PM      Failed - Medication not assigned to a protocol, review manually.      Passed - Valid encounter within last 12 months    Recent Outpatient Visits           2 weeks ago Controlled type 2 diabetes mellitus with complication, with long-term current use of insulin ([place])   [facility], [name], FNP   4 months ago Right hand pain   [facility], [name], FNP   4 months ago Controlled type 2 diabetes mellitus with complication, with long-term current use of insulin [facility])   [name], [name], FNP   4 months ago Ear pain, left   [facility], [name], FNP   8 months ago Essential hypertension   [place], DO       Future Appointments             In 2 months [place], [facility], [facility]             Signed Prescriptions Disp Refills   albuterol (VENTOLIN HFA) 108 (90 Base) MCG/ACT inhaler 8.5 g 1    Sig: INHALE 1-2 PUFFS EVERY 6 HOURS AS NEEDEDSHORTNESS OF BREATH AND WHEEZING      Pulmonology:  Beta Agonists Failed - 06/14/2020  1:48 PM      Failed - One inhaler should last at least one month. If the patient is requesting refills earlier, contact the patient to check for uncontrolled symptoms.      Passed - Valid encounter within last 12 months    Recent Outpatient Visits           2 weeks ago Controlled type 2 diabetes mellitus  with complication, with long-term current use of insulin [facility])   [facility], [name], FNP   4 months ago Right hand pain   [facility] [place], [name], FNP   4 months ago Controlled type 2 diabetes mellitus with complication, with long-term current use of insulin [facility])   [place], FNP   4 months ago Ear pain, left   [place], FNP   8 months ago Essential hypertension   [facility] Olin Hauser, DO       Future Appointments             In 2 months [place], [name], [facility], [facility]

## 2020-06-14 NOTE — Telephone Encounter (Signed)
Requested Prescriptions  Pending Prescriptions Disp Refills  . albuterol (VENTOLIN HFA) 108 (90 Base) MCG/ACT inhaler [Pharmacy Med Name: ALBUTEROL SULFATE HFA 108 (90 BASE)] 8.5 g 1    Sig: INHALE 1-2 PUFFS EVERY 6 HOURS AS NEEDEDSHORTNESS OF BREATH AND WHEEZING     Pulmonology:  Beta Agonists Failed - 06/14/2020  1:48 PM      Failed - One inhaler should last at least one month. If the patient is requesting refills earlier, contact the patient to check for uncontrolled symptoms.      Passed - Valid encounter within last 12 months    Recent Outpatient Visits          2 weeks ago Controlled type 2 diabetes mellitus with complication, with long-term current use of insulin [facility])   [facility], [name], FNP   4 months ago Right hand pain   [name] [place], [name], FNP   4 months ago Controlled type 2 diabetes mellitus with complication, with long-term current use of insulin [facility])   [facility], [name], FNP   4 months ago Ear pain, left   [facility], [name], FNP   8 months ago Essential hypertension   [facility] Olin Hauser, DO      Future Appointments            In 2 months [place], [name], [facility], [place]           . TRELEGY ELLIPTA 100-62.5-25 MCG/INH AEPB [Pharmacy Med Name: TRELEGY ELLIPTA 100-62.5-25 MCG/INH] 60 each 0    Sig: INHALE 1 PUFF INTO THE LUNGS ONCE DAILY     Off-Protocol Failed - 06/14/2020  1:48 PM      Failed - Medication not assigned to a protocol, review manually.      Passed - Valid encounter within last 12 months    Recent Outpatient Visits          2 weeks ago Controlled type 2 diabetes mellitus with complication, with long-term current use of insulin [facility])   [facility], [name], FNP   4 months ago Right hand pain   [facility] [place], [name], FNP   4 months ago Controlled  type 2 diabetes mellitus with complication, with long-term current use of insulin [facility])   [place], FNP   4 months ago Ear pain, left   [place], FNP   8 months ago Essential hypertension   [facility] Olin Hauser, DO      Future Appointments            In 2 months [place], [name], [facility], [name]

## 2020-06-15 ENCOUNTER — Other Ambulatory Visit: Payer: Self-pay | Admitting: Family Medicine

## 2020-06-15 DIAGNOSIS — D649 Anemia, unspecified: Secondary | ICD-10-CM

## 2020-06-15 LAB — CBC WITH DIFFERENTIAL/PLATELET
Absolute Monocytes: 279 cells/uL (ref 200–950)
Basophils Absolute: 32 cells/uL (ref 0–200)
Basophils Relative: 0.7 %
Eosinophils Absolute: 158 cells/uL (ref 15–500)
Eosinophils Relative: 3.5 %
HCT: 34.2 % — ABNORMAL LOW (ref 35.0–45.0)
Hemoglobin: 11.2 g/dL — ABNORMAL LOW (ref 11.7–15.5)
Lymphs Abs: 1467 cells/uL (ref 850–3900)
MCH: 30.8 pg (ref 27.0–33.0)
MCHC: 32.7 g/dL (ref 32.0–36.0)
MCV: 94 fL (ref 80.0–100.0)
MPV: 8.8 fL (ref 7.5–12.5)
Monocytes Relative: 6.2 %
Neutro Abs: 2565 cells/uL (ref 1500–7800)
Neutrophils Relative %: 57 %
Platelets: 258 10*3/uL (ref 140–400)
RBC: 3.64 10*6/uL — ABNORMAL LOW (ref 3.80–5.10)
RDW: 13.2 % (ref 11.0–15.0)
Total Lymphocyte: 32.6 %
WBC: 4.5 10*3/uL (ref 3.8–10.8)

## 2020-06-15 LAB — COMPLETE METABOLIC PANEL WITH GFR
AG Ratio: 1.8 (calc) (ref 1.0–2.5)
ALT: 17 U/L (ref 6–29)
AST: 17 U/L (ref 10–35)
Albumin: 4.2 g/dL (ref 3.6–5.1)
Alkaline phosphatase (APISO): 65 U/L (ref 37–153)
BUN: 16 mg/dL (ref 7–25)
CO2: 27 mmol/L (ref 20–32)
Calcium: 9.5 mg/dL (ref 8.6–10.4)
Chloride: 105 mmol/L (ref 98–110)
Creat: 0.83 mg/dL (ref 0.60–0.93)
GFR, Est African American: 82 mL/min/{1.73_m2} (ref >=60)
GFR, Est Non African American: 70 mL/min/{1.73_m2} (ref >=60)
Globulin: 2.4 g/dL (calc) (ref 1.9–3.7)
Glucose, Bld: 114 mg/dL — ABNORMAL HIGH (ref 65–99)
Potassium: 4.1 mmol/L (ref 3.5–5.3)
Sodium: 141 mmol/L (ref 135–146)
Total Bilirubin: 0.5 mg/dL (ref 0.2–1.2)
Total Protein: 6.6 g/dL (ref 6.1–8.1)

## 2020-06-15 LAB — THYROID PANEL WITH TSH
Free Thyroxine Index: 2.9 (ref 1.4–3.8)
T3 Uptake: 29 % (ref 22–35)
T4, Total: 10.1 ug/dL (ref 5.1–11.9)
TSH: 1.17 mIU/L (ref 0.40–4.50)

## 2020-06-15 LAB — LIPID PANEL
Cholesterol: 114 mg/dL (ref <200)
HDL: 60 mg/dL (ref >=50)
LDL Cholesterol (Calc): 38 mg/dL (calc)
Non-HDL Cholesterol (Calc): 54 mg/dL (calc) (ref <130)
Total CHOL/HDL Ratio: 1.9 (calc) (ref <5.0)
Triglycerides: 82 mg/dL (ref <150)

## 2020-06-15 LAB — VITAMIN D 25 HYDROXY (VIT D DEFICIENCY, FRACTURES): Vit D, 25-Hydroxy: 40 ng/mL (ref 30–100)

## 2020-06-15 LAB — HEMOGLOBIN A1C
Hgb A1c MFr Bld: 6.1 % of total Hgb — ABNORMAL HIGH (ref <5.7)
Mean Plasma Glucose: 128 (calc)
eAG (mmol/L): 7.1 (calc)

## 2020-06-21 ENCOUNTER — Inpatient Hospital Stay: Payer: Medicaid Other

## 2020-06-21 ENCOUNTER — Inpatient Hospital Stay: Payer: Medicaid Other | Admitting: Oncology

## 2020-06-22 ENCOUNTER — Telehealth: Payer: Self-pay

## 2020-06-23 ENCOUNTER — Inpatient Hospital Stay: Payer: Medicare Other | Attending: Oncology | Admitting: Oncology

## 2020-06-23 ENCOUNTER — Inpatient Hospital Stay: Payer: Medicare Other

## 2020-06-25 ENCOUNTER — Ambulatory Visit: Payer: Medicare Other | Admitting: Podiatry

## 2020-06-29 ENCOUNTER — Ambulatory Visit (INDEPENDENT_AMBULATORY_CARE_PROVIDER_SITE_OTHER): Payer: Medicare HMO | Admitting: Licensed Clinical Social Worker

## 2020-06-29 ENCOUNTER — Other Ambulatory Visit: Payer: Self-pay

## 2020-06-29 ENCOUNTER — Encounter: Payer: Self-pay | Admitting: Family Medicine

## 2020-06-29 ENCOUNTER — Ambulatory Visit (INDEPENDENT_AMBULATORY_CARE_PROVIDER_SITE_OTHER): Payer: Medicare HMO | Admitting: Family Medicine

## 2020-06-29 VITALS — BP 118/81 | HR 79

## 2020-06-29 DIAGNOSIS — F324 Major depressive disorder, single episode, in partial remission: Secondary | ICD-10-CM | POA: Diagnosis not present

## 2020-06-29 DIAGNOSIS — G8929 Other chronic pain: Secondary | ICD-10-CM

## 2020-06-29 DIAGNOSIS — I152 Hypertension secondary to endocrine disorders: Secondary | ICD-10-CM

## 2020-06-29 DIAGNOSIS — J432 Centrilobular emphysema: Secondary | ICD-10-CM | POA: Diagnosis not present

## 2020-06-29 DIAGNOSIS — E785 Hyperlipidemia, unspecified: Secondary | ICD-10-CM | POA: Diagnosis not present

## 2020-06-29 DIAGNOSIS — G4700 Insomnia, unspecified: Secondary | ICD-10-CM | POA: Diagnosis not present

## 2020-06-29 DIAGNOSIS — Z794 Long term (current) use of insulin: Secondary | ICD-10-CM | POA: Diagnosis not present

## 2020-06-29 DIAGNOSIS — E1169 Type 2 diabetes mellitus with other specified complication: Secondary | ICD-10-CM | POA: Diagnosis not present

## 2020-06-29 DIAGNOSIS — E1159 Type 2 diabetes mellitus with other circulatory complications: Secondary | ICD-10-CM | POA: Diagnosis not present

## 2020-06-29 DIAGNOSIS — M545 Low back pain, unspecified: Secondary | ICD-10-CM

## 2020-06-29 DIAGNOSIS — E118 Type 2 diabetes mellitus with unspecified complications: Secondary | ICD-10-CM | POA: Diagnosis not present

## 2020-06-29 MED ORDER — TRAZODONE HCL 50 MG PO TABS: 25.0000 mg | ORAL_TABLET | Freq: Every evening | ORAL | 1 refills | Status: DC | PRN

## 2020-06-29 MED ORDER — CYCLOBENZAPRINE HCL 5 MG PO TABS: 2.5000 mg | ORAL_TABLET | Freq: Three times a day (TID) | ORAL | 1 refills | Status: DC | PRN

## 2020-06-29 NOTE — Assessment & Plan Note (Addendum)
Increase in this pain as of recently, requesting muscle relaxer to help with her low back pain symptoms.  Discussed using topical voltaren gel PRN and reports has tried but has not made much improvement with this.  Discussed cyclobenzaprine 2.5-5mg  TID PRN for low back pain, not to take along with trazodone due to increased risk of sedation.  Patient verbalized understanding.  Plan: 1. Begin cyclobenzaprine 2.5-5mg  TID PRN for low back pain 2. RTC in 4 weeks for re-evaluation

## 2020-06-29 NOTE — Chronic Care Management (AMB) (Signed)
Chronic Care Management    Clinical Social Work Follow Up Note  06/29/2020 Name: Kendra [name] MRN: [id number] DOB: May 27, 1946  Kendra [name] is a 74 y.o. year old female who is a primary care patient of [name], FNP. The CCM team was consulted for assistance with [employer]  and [employer].   Review of patient status, including review of consultants reports, other relevant assessments, and collaboration with appropriate care team members and the patient's provider was performed as part of comprehensive patient evaluation and provision of chronic care management services.    SDOH (Social Determinants of Health) assessments performed: Yes    Outpatient Encounter Medications as of 06/29/2020  Medication Sig  . albuterol (VENTOLIN HFA) 108 (90 Base) MCG/ACT inhaler INHALE 1-2 PUFFS EVERY 6 HOURS AS NEEDEDSHORTNESS OF BREATH AND WHEEZING  . aspirin 325 MG tablet Take 325 mg by mouth daily.  [name] atorvastatin (LIPITOR) 40 MG tablet TAKE 1 TABLET BY MOUTH ONCE DAILY  . Blood Glucose Monitoring Suppl (ONETOUCH VERIO) w/Device KIT USE AS DIRECTED  . buPROPion (WELLBUTRIN XL) 150 MG 24 hr tablet TAKE 1 TABLET BY MOUTH ONCE DAILY  . Cholecalciferol (VITAMIN D3) 50 MCG (2000 UT) capsule Take 1 capsule (2,000 Units total) by mouth daily.  . clopidogrel (PLAVIX) 75 MG tablet TAKE 1 TABLET BY MOUTH ONCE DAILY  . diclofenac Sodium (VOLTAREN) 1 % GEL APPLY 2 GRAMS TOPICALLY 4 TIMES DAILY  . gentamicin cream (GARAMYCIN) 0.1 % Apply 1 application topically 2 (two) times daily.  [name] glucose blood (ONETOUCH VERIO) test strip Use as instructed  . Lancet Devices (ONE TOUCH DELICA LANCING DEV) MISC 1 Device by Does not apply route 2 (two) times daily.  [name] lisinopril (ZESTRIL) 5 MG tablet Take 1 tablet (5 mg total) by mouth daily.  . metFORMIN (GLUCOPHAGE-XR) 500 MG 24 hr tablet TAKE 1 TABLET BY MOUTH TWICE DAILY BEFORE A MEAL  . mupirocin ointment (BACTROBAN) 2 %  Apply 1 application topically daily.  . nicotine polacrilex (COMMIT) 4 MG lozenge Take 4 mg by mouth as needed for smoking cessation.  . ondansetron (ZOFRAN) 4 MG tablet Take 2 tablets (8 mg total) by mouth every 8 (eight) hours as needed for nausea or vomiting.  [name] Lancets 29J MISC 1 Device by Does not apply route 2 (two) times daily.  . TRELEGY ELLIPTA 100-62.5-25 MCG/INH AEPB INHALE 1 PUFF INTO THE LUNGS ONCE DAILY   No facility-administered encounter medications on file as of 06/29/2020.     Goals Addressed    .  SW-I probably could use some extra support right now. (pt-stated)        Current Barriers:  . Financial constraints . Limited social support . ADL IADL limitations . Mental Health Concerns  . Social Isolation . Limited access to caregiver . Inability to perform ADL's independently . Inability to perform IADL's independently . Lacks knowledge of community resource: grief support resources within the area  Clinical Social Work Clinical Goal(s):  [name] Over the next 120 days, patient will work with SW to address concerns related to gaining additional support within the home and resource connection in order to maintain health and independency within the community  . Over the next 120 days, patient will demonstrate improved adherence to self care as evidenced by implementing healthy self-care into her daily routine such as: attending all medical appointments, taking time for self-reflection, taking medications as prescribed, drinking water and daily exercise to improve mobility.  Interventions: . Patient interviewed and appropriate assessments performed . Education provided on how to implement appropriate self-care and coping tools into her daily routine. Patient was receptive to this education. . Patient reports that her feet continue to swell even though she elevates them on a pillow regularly. Patient reports that her right toe is red and irritated. Patient shares  that she put some pain relief ointment on it last night which helped. . Patient is experiencing increase stress at this time because her son will have to go to prison for 15 months starting 05/31/20. Patient is very concerned about patient's physical and mental state while serving time. Patient reports that her son was responsible for paying most of her bills and this concerns her as well.  . Provided mental health counseling with regard to current stressors. LCSW used active and reflective listening and implemented appropriate interventions to help suppport patient and her emotional needs. Advised patient to implement deep breathing/grounding/meditation/self-care exercises into her daily routine to combat stressors. Education provided.  [name] LCSW was informed that patient's sleep hygiene and routine is the same. She reports ongoing difficulty with staying asleep at night which affects her energy, mobility and motivation. Patient reports interest in gaining medication assistance for this concern. Patient ask for [facility] office number so that she can set up appointment with PCP to discuss. LCSW provided education on healthy sleep hygiene and what that looks like. LCSW encouraged patient to implement a night time routine into her schedule that works best for her and that she is able to maintain. Advised patient to implement deep breathing/grounding/meditation/self-care exercises into her nightly routine to combat racing thoughts at night.  [name] LCSW reviewed upcoming appointments with patient. She is agreeable to contact [facility] to set up transportation arrangements for next weeks' appointments.  . Provided patient with information about grief support resources within her area. Patient denies needing this support at this time but was appreciative of education that was provided.  . Discussed plans with patient for ongoing care management follow up and provided patient with direct contact information for care management  team . Patient reports that she has been managing her blood pressure very well and is proud of this accomplishment. LCSW provided education on healthy self-care and provide positive reinforcement for taking action to improve her overall health and symptoms.  . Assisted patient/caregiver with obtaining information about health plan benefits . LCSW used active and reflective listening and implemented appropriate interventions to help suppport patient and her emotional needs.  Patient Self Care Activities:  . Attends all scheduled provider appointments . Calls provider office for new concerns or questions  Please see past updates related to this goal by clicking on the "Past Updates" button in the selected goal       Follow Up Plan: SW will follow up with patient by phone over the next quarter  Kendra [name], [place], MSW, [email or web address] Phone: [phone]

## 2020-06-29 NOTE — Patient Instructions (Signed)
I have sent in a prescription for trazodone 50mg , to take 1/2-1 tablet (25-50mg ) an hour before bed, as needed, for insomnia.  To work on sleep hygiene routine as well.  I have sent in a prescription for cyclobenzaprine 5mg  to take 1/2-1 tablet (2.5-5mg ) up to 3x per day as needed for low back pain.  Not to take along with trazodone as this can increase risk for sedation.  Sleep hygiene is the single most effective treatment for sleep issues, but it is hard work.  Tips for a good night's sleep:  -Keep sleep environment comfortable and conducive to sleep -Keep regular sleep schedule 7 nights a week -Avoiding naps during the day -Avoiding going to bed until drowsy and ready to sleep, not trying to sleep, and not watching the clock -Get out of bed if not asleep within 15-20 minutes and returning only when drowsy -Avoiding caffeine, nicotine, alcohol, and other substances that interfere with sleep before bedtime -Take an hour before your set bedtime and start to wind down: bath/shower, no more TV or phone (the blue light can interfere with sleeping), listen to soothing music, or meditation -No TV in your bedroom -Exercising regularly, at least 6 hours before sleep. Yoga and Tai Chi can improve sleep quality  There are a lot of books and apps that may help guide you with any of the following:   -Progressive muscle relaxation (involves methodical tension and relaxation of different Muscle groups throughout body)  Guided imagery  -YouTube - Gwynne Edinger has free videos on YouTube that can help with meditation and some   Abdominal breathing   Over the counter sleep aid one hour before bed- and gradually wean your use over 2-4 weeks  Some examples are : *Melatonin 5-10 mg *Sleepology (Can find on [employer]) taken according to packaging directions  There are a few online evidence based online programs, unfortunately they are not free.   Developed by a sleep expert who created a drug-free  program for insomnia proven more effective than sleeping pills.  www.cbtforinsomnia.com Sleepio is an evidence-based digital sleep improvement program   www.sleepio.com SHUTi is designed to actively help retrain your body and mind for great sleep through six engaging Cognitive Behavioral Therapy for Insomnia strategy and learning sessions  [email or web address]  We will plan to see you back in 4 weeks for insomnia re-evaluation and low back pain follow up  You will receive a survey after today's visit either digitally by e-mail or paper by [employer]. Your experiences and feedback matter to [place].  Please respond so we know how we are doing as we provide care for you.  Call us with any questions/concerns/needs.  It is my goal to be available to you for your health concerns.  Thanks for choosing me to be a partner in your healthcare needs!  Harlin Rain, FNP-C Family Nurse Practitioner [facility] Phone: [phone]

## 2020-06-29 NOTE — Assessment & Plan Note (Signed)
Discussed sleep hygiene routine.  Waking up due to outdoor noise and then is unable to fall back asleep.  Discussed could try Trazodone 25-50mg  nightly, may not stop her from waking up with noise from the neighbors, but should help her be able to fall back asleep.  Plan: 1. Begin trazodone 25-50mg  PO QHS PRN for insomnia 2. Work on sleep hygiene routine 3. RTC in 4 weeks for re-evaluation

## 2020-06-29 NOTE — Progress Notes (Signed)
Virtual Visit via Telephone  The purpose of this virtual visit is to provide medical care while limiting exposure to the novel coronavirus (COVID19) for both patient and office staff.  Consent was obtained for phone visit:  Yes.   Answered questions that patient had about telehealth interaction:  Yes.   I discussed the limitations, risks, security and privacy concerns of performing an evaluation and management service by telephone. I also discussed with the patient that there may be a patient responsible charge related to this service. The patient expressed understanding and agreed to proceed.  Patient is at home and is accessed via telephone Services are provided by Nicole Marie Malfi, FNP-C from [facility] (Office)  ---------------------------------------------------------------------- Chief Complaint  Patient presents with  . Insomnia    difficulty staying asleep, because her neighbors are noise. She complains that the dogs bark all night and they play loud music all night. x 2mths     S: Reviewed CMA documentation. I have called patient and gathered additional HPI as follows:  Ms. Germano presents for virtual telemedicine visit for concerns of insomnia with difficulty staying asleep along with low back pain.  Reports that her neighbors are noisy and have dogs that bark, she wakes up because of this nightly and then has a difficult time getting back to sleep.  Reports new flare of chronic back pain that has been worsened with movement, but improves with laying flat.  Denies any difficulty with falling asleep or history of insomnia.  Has taken acetaminophen in the past which has helped for a few hours before wearing off.  Reports new back pain has been present for a few days, denies numbness, tingling, weakness, radiation of pain, saddle anesthesia, change in bowel/bladder function.  Patient is currently home Denies any high risk travel to areas of current concern for  COVID19. Denies any known or suspected exposure to person with or possibly with COVID19.  Past Medical History:  Diagnosis Date  . Asthma   . Back pain   . Cataract   . Cervical disc disorder   . COPD (chronic obstructive pulmonary disease) (HCC)   . Depression   . Diabetes (HCC)    type II  . Diabetic neuropathy (HCC)   . Dyspnea    with exertion  . GERD (gastroesophageal reflux disease)   . History of kidney stones    per patient "can't remember the year"  . Hyperlipidemia   . Hypertension    per patient "take meds for it"  . Insomnia   . Neuropathy   . Osteoarthritis   . Osteopenia   . Pneumonia 2018   per patient  . Reflux   . Rheumatoid arthritis (HCC)   . Stroke (HCC) 2015   and again 2017  - Weakness in left leg, staggers w/ walking, vision   . Tenosynovitis    Social History   Tobacco Use  . Smoking status: Current Some Day Smoker    Types: Cigarettes  . Smokeless tobacco: Former User    Types: Snuff  . Tobacco comment: .5 of 1 cigarette a day  Vaping Use  . Vaping Use: Never used  Substance Use Topics  . Alcohol use: No  . Drug use: No    Current Outpatient Medications:  .  albuterol (VENTOLIN HFA) 108 (90 Base) MCG/ACT inhaler, INHALE 1-2 PUFFS EVERY 6 HOURS AS NEEDEDSHORTNESS OF BREATH AND WHEEZING, Disp: 8.5 g, Rfl: 1 .  aspirin 325 MG tablet, Take 325 mg by mouth   daily., Disp: , Rfl:  .  atorvastatin (LIPITOR) 40 MG tablet, TAKE 1 TABLET BY MOUTH ONCE DAILY, Disp: 90 tablet, Rfl: 0 .  Blood Glucose Monitoring Suppl (ONETOUCH VERIO) w/Device KIT, USE AS DIRECTED, Disp: 1 kit, Rfl: 0 .  buPROPion (WELLBUTRIN XL) 150 MG 24 hr tablet, TAKE 1 TABLET BY MOUTH ONCE DAILY, Disp: 90 tablet, Rfl: 0 .  Cholecalciferol (VITAMIN D3) 50 MCG (2000 UT) capsule, Take 1 capsule (2,000 Units total) by mouth daily., Disp: 90 capsule, Rfl: 1 .  clopidogrel (PLAVIX) 75 MG tablet, TAKE 1 TABLET BY MOUTH ONCE DAILY, Disp: 90 tablet, Rfl: 0 .  glucose blood (ONETOUCH  VERIO) test strip, Use as instructed, Disp: 200 each, Rfl: 4 .  Lancet Devices (ONE TOUCH DELICA LANCING DEV) MISC, 1 Device by Does not apply route 2 (two) times daily., Disp: 200 each, Rfl: 4 .  lisinopril (ZESTRIL) 5 MG tablet, Take 1 tablet (5 mg total) by mouth daily., Disp: 90 tablet, Rfl: 3 .  metFORMIN (GLUCOPHAGE-XR) 500 MG 24 hr tablet, TAKE 1 TABLET BY MOUTH TWICE DAILY BEFORE A MEAL, Disp: 60 tablet, Rfl: 3 .  mupirocin ointment (BACTROBAN) 2 %, Apply 1 application topically daily., Disp: 22 g, Rfl: 0 .  nicotine polacrilex (COMMIT) 4 MG lozenge, Take 4 mg by mouth as needed for smoking cessation., Disp: , Rfl:  .  ondansetron (ZOFRAN) 4 MG tablet, Take 2 tablets (8 mg total) by mouth every 8 (eight) hours as needed for nausea or vomiting., Disp: 20 tablet, Rfl: 0 .  OneTouch Delica Lancets 33G MISC, 1 Device by Does not apply route 2 (two) times daily., Disp: 200 each, Rfl: 4 .  TRELEGY ELLIPTA 100-62.5-25 MCG/INH AEPB, INHALE 1 PUFF INTO THE LUNGS ONCE DAILY, Disp: 60 each, Rfl: 1 .  cyclobenzaprine (FLEXERIL) 5 MG tablet, Take 0.5-1 tablets (2.5-5 mg total) by mouth 3 (three) times daily as needed for muscle spasms., Disp: 30 tablet, Rfl: 1 .  diclofenac Sodium (VOLTAREN) 1 % GEL, APPLY 2 GRAMS TOPICALLY 4 TIMES DAILY (Patient not taking: Reported on 06/29/2020), Disp: 100 g, Rfl: 0 .  gentamicin cream (GARAMYCIN) 0.1 %, Apply 1 application topically 2 (two) times daily. (Patient not taking: Reported on 06/29/2020), Disp: 30 g, Rfl: 1 .  traZODone (DESYREL) 50 MG tablet, Take 0.5-1 tablets (25-50 mg total) by mouth at bedtime as needed for sleep., Disp: 30 tablet, Rfl: 1  Depression screen PHQ 2/9 11/04/2019 09/22/2019 04/30/2019  Decreased Interest 0 3 3  Down, Depressed, Hopeless 1 3 2  PHQ - 2 Score 1 6 5  Altered sleeping 0 0 2  Tired, decreased energy 0 0 1  Change in appetite 1 0 0  Feeling bad or failure about yourself  0 0 0  Trouble concentrating 0 0 0  Moving slowly or  fidgety/restless 0 0 1  Suicidal thoughts 0 0 0  PHQ-9 Score 2 6 9  Difficult doing work/chores Not difficult at all Not difficult at all Not difficult at all  Some recent data might be hidden    GAD 7 : Generalized Anxiety Score 09/22/2019 04/30/2019 02/17/2019  Nervous, Anxious, on Edge 2 0 (No Data)  Control/stop worrying 3 0 -  Worry too much - different things 3 2 -  Trouble relaxing 0 0 -  Restless 2 1 -  Easily annoyed or irritable 3 3 -  Afraid - awful might happen 1 0 -  Total GAD 7 Score 14 6 -  Anxiety Difficulty   Not difficult at all Not difficult at all -    -------------------------------------------------------------------------- O: No physical exam performed due to remote telephone encounter.  Physical Exam: Patient remotely monitored without video.  Verbal communication appropriate.  Cognition normal.  Recent Results (from the past 2160 hour(s))  CBC with Differential     Status: Abnormal   Collection Time: 06/14/20  9:29 AM  Result Value Ref Range   WBC 4.5 3.8 - 10.8 Thousand/uL   RBC 3.64 (L) 3.80 - 5.10 Million/uL   Hemoglobin 11.2 (L) 11.7 - 15.5 g/dL   HCT 34.2 (L) 35 - 45 %   MCV 94.0 80.0 - 100.0 fL   MCH 30.8 27.0 - 33.0 pg   MCHC 32.7 32.0 - 36.0 g/dL   RDW 13.2 11.0 - 15.0 %   Platelets 258 140 - 400 Thousand/uL   MPV 8.8 7.5 - 12.5 fL   Neutro Abs 2,565 1,500 - 7,800 cells/uL   Lymphs Abs 1,467 850 - 3,900 cells/uL   Absolute Monocytes 279 200 - 950 cells/uL   Eosinophils Absolute 158 15.0 - 500.0 cells/uL   Basophils Absolute 32 0.0 - 200.0 cells/uL   Neutrophils Relative % 57 %   Total Lymphocyte 32.6 %   Monocytes Relative 6.2 %   Eosinophils Relative 3.5 %   Basophils Relative 0.7 %  COMPLETE METABOLIC PANEL WITH GFR     Status: Abnormal   Collection Time: 06/14/20  9:29 AM  Result Value Ref Range   Glucose, Bld 114 (H) 65 - 99 mg/dL    Comment: .            Fasting reference interval . For someone without known diabetes, a glucose  value between 100 and 125 mg/dL is consistent with prediabetes and should be confirmed with a follow-up test. .    BUN 16 7 - 25 mg/dL   Creat 0.83 0.60 - 0.93 mg/dL    Comment: For patients >49 years of age, the reference limit for Creatinine is approximately 13% higher for people identified as African-American. .    GFR, Est Non African American 70 > OR = 60 mL/min/1.73m2   GFR, Est African American 82 > OR = 60 mL/min/1.73m2   BUN/Creatinine Ratio NOT APPLICABLE 6 - 22 (calc)   Sodium 141 135 - 146 mmol/L   Potassium 4.1 3.5 - 5.3 mmol/L   Chloride 105 98 - 110 mmol/L   CO2 27 20 - 32 mmol/L   Calcium 9.5 8.6 - 10.4 mg/dL   Total Protein 6.6 6.1 - 8.1 g/dL   Albumin 4.2 3.6 - 5.1 g/dL   Globulin 2.4 1.9 - 3.7 g/dL (calc)   AG Ratio 1.8 1.0 - 2.5 (calc)   Total Bilirubin 0.5 0.2 - 1.2 mg/dL   Alkaline phosphatase (APISO) 65 37 - 153 U/L   AST 17 10 - 35 U/L   ALT 17 6 - 29 U/L  HgB A1c     Status: Abnormal   Collection Time: 06/14/20  9:29 AM  Result Value Ref Range   Hgb A1c MFr Bld 6.1 (H) <5.7 % of total Hgb    Comment: For someone without known diabetes, a hemoglobin  A1c value between 5.7% and 6.4% is consistent with prediabetes and should be confirmed with a  follow-up test. . For someone with known diabetes, a value <7% indicates that their diabetes is well controlled. A1c targets should be individualized based on duration of diabetes, age, comorbid conditions, and other considerations. . This assay result   is consistent with an increased risk of diabetes. . Currently, no consensus exists regarding use of hemoglobin A1c for diagnosis of diabetes for children. .    Mean Plasma Glucose 128 (calc)   eAG (mmol/L) 7.1 (calc)  Vitamin D (25 hydroxy)     Status: None   Collection Time: 06/14/20  9:29 AM  Result Value Ref Range   Vit D, 25-Hydroxy 40 30 - 100 ng/mL    Comment: Vitamin D Status         25-OH Vitamin D: . Deficiency:                    <20  ng/mL Insufficiency:             20 - 29 ng/mL Optimal:                 > or = 30 ng/mL . For 25-OH Vitamin D testing on patients on  D2-supplementation and patients for whom quantitation  of D2 and D3 fractions is required, the QuestAssureD(TM) 25-OH VIT D, (D2,D3), LC/MS/MS is recommended: order  code 92888 (patients >2yrs). See Note 1 . Note 1 . For additional information, please refer to  http://education.Quest[facility].com/faq/FAQ199  (This link is being provided for informational/ educational purposes only.)   Thyroid Panel With TSH     Status: None   Collection Time: 06/14/20  9:29 AM  Result Value Ref Range   T3 Uptake 29 22 - 35 %   T4, Total 10.1 5.1 - 11.9 mcg/dL   Free Thyroxine Index 2.9 1.4 - 3.8   TSH 1.17 0.40 - 4.50 mIU/L  Lipid panel     Status: None   Collection Time: 06/14/20  9:29 AM  Result Value Ref Range   Cholesterol 114 <200 mg/dL   HDL 60 > OR = 50 mg/dL   Triglycerides 82 <150 mg/dL   LDL Cholesterol (Calc) 38 mg/dL (calc)    Comment: Reference range: <100 . Desirable range <100 mg/dL for primary prevention;   <70 mg/dL for patients with CHD or diabetic patients  with > or = 2 CHD risk factors. . LDL-C is now calculated using the Martin-Hopkins  calculation, which is a validated novel method providing  better accuracy than the Friedewald equation in the  estimation of LDL-C.  Martin SS et al. JAMA. 2013;310(19): 2061-2068  (http://education.[facility].com/faq/FAQ164)    Total CHOL/HDL Ratio 1.9 <5.0 (calc)   Non-HDL Cholesterol (Calc) 54 <130 mg/dL (calc)    Comment: For patients with diabetes plus 1 major ASCVD risk  factor, treating to a non-HDL-C goal of <100 mg/dL  (LDL-C of <70 mg/dL) is considered a therapeutic  option.     -------------------------------------------------------------------------- A&P:  Problem List Items Addressed This Visit      Other   Insomnia - Primary    Discussed sleep hygiene routine.   Waking up due to outdoor noise and then is unable to fall back asleep.  Discussed could try Trazodone 25-50mg nightly, may not stop her from waking up with noise from the neighbors, but should help her be able to fall back asleep.  Plan: 1. Begin trazodone 25-50mg PO QHS PRN for insomnia 2. Work on sleep hygiene routine 3. RTC in 4 weeks for re-evaluation      Relevant Medications   traZODone (DESYREL) 50 MG tablet   Chronic midline low back pain without sciatica    Increase in this pain as of recently, requesting muscle relaxer to help with her low back pain   symptoms.  Discussed using topical voltaren gel PRN and reports has tried but has not made much improvement with this.  Discussed cyclobenzaprine 2.5-19m TID PRN for low back pain, not to take along with trazodone due to increased risk of sedation.  Patient verbalized understanding.  Plan: 1. Begin cyclobenzaprine 2.5-563mTID PRN for low back pain 2. RTC in 4 weeks for re-evaluation      Relevant Medications   cyclobenzaprine (FLEXERIL) 5 MG tablet      Meds ordered this encounter  Medications  . traZODone (DESYREL) 50 MG tablet    Sig: Take 0.5-1 tablets (25-50 mg total) by mouth at bedtime as needed for sleep.    Dispense:  30 tablet    Refill:  1  . cyclobenzaprine (FLEXERIL) 5 MG tablet    Sig: Take 0.5-1 tablets (2.5-5 mg total) by mouth 3 (three) times daily as needed for muscle spasms.    Dispense:  30 tablet    Refill:  1    Follow-up: - Return in 4 weeks for re-evaluation of insomnia and low back pain  Patient verbalizes understanding with the above medical recommendations including the limitation of remote medical advice.  Specific follow-up and call-back criteria were given for patient to follow-up or seek medical care more urgently if needed.  - Time spent in direct consultation with patient on phone: 7 minutes  NiHarlin RainFNLake Annetteroup 06/29/2020,  3:05 PM

## 2020-07-05 ENCOUNTER — Ambulatory Visit (INDEPENDENT_AMBULATORY_CARE_PROVIDER_SITE_OTHER): Payer: Medicare HMO | Admitting: Podiatry

## 2020-07-05 ENCOUNTER — Encounter: Payer: Self-pay | Admitting: Podiatry

## 2020-07-05 ENCOUNTER — Other Ambulatory Visit: Payer: Self-pay

## 2020-07-05 DIAGNOSIS — E0843 Diabetes mellitus due to underlying condition with diabetic autonomic (poly)neuropathy: Secondary | ICD-10-CM

## 2020-07-05 DIAGNOSIS — L97521 Non-pressure chronic ulcer of other part of left foot limited to breakdown of skin: Secondary | ICD-10-CM

## 2020-07-05 DIAGNOSIS — F17200 Nicotine dependence, unspecified, uncomplicated: Secondary | ICD-10-CM | POA: Diagnosis not present

## 2020-07-05 DIAGNOSIS — R6889 Other general symptoms and signs: Secondary | ICD-10-CM | POA: Diagnosis not present

## 2020-07-05 MED ORDER — GENTAMICIN SULFATE 0.1 % EX CREA
1.0000 | TOPICAL_CREAM | Freq: Two times a day (BID) | CUTANEOUS | 1 refills | Status: DC
Start: 2020-07-05 — End: 2020-09-23

## 2020-07-05 NOTE — Progress Notes (Signed)
  Subjective:  Patient ID: Kendra [name], female    DOB: 1946/08/31,  MRN: [id number]  Chief Complaint  Patient presents with  . Ingrown Toenail    "they are doing ok, but still tender and sore"    74 y.o. female presents with the above complaint. History confirmed with patient.  She previously underwent complete nail avulsions to the bilateral hallux with Dr. Amalia Hailey.  The left foot has been slow to heal.  Objective:  Physical Exam: Bilateral feet are warm and well perfused with palpable pulses.  She has good capillary refill time.  The right hallux avulsion site has healed with regrowth of nail, and the left hallux nail bed is ulcerated with fibrogranular base, there is no exposed tendon or bone or signs of acute infection.  Ulceration measures pressure 1 cm in diameter.  Assessment:   1. Diabetes mellitus due to underlying condition with diabetic autonomic neuropathy, unspecified whether long term insulin use ([place])   2. Skin ulcer of toe of left foot, limited to breakdown of skin ([place])   3. Tobacco use disorder      Plan:  Patient was evaluated and treated and all questions answered.  -Dressing applied consisting of Iodosorb -Wound cleansed and debrided -Refill prescription for gentamicin cream she will continue using this at home with adhesive bandage -Can leave right hallux open to air -I would like to return in 2 to 3 weeks to be evaluated by Dr. Amalia Hailey  Procedure: Selective Debridement of Wound Rationale: Removal of devitalized tissue from the wound to promote healing.  Pre-Debridement Wound Measurements: 1.0 cm x 1.0 cm x 0.1 cm  Post-Debridement Wound Measurements: same as pre-debridement. Type of Debridement: sharp selective Tissue Removed: Devitalized soft-tissue Dressing: Dry, sterile, compression dressing. Disposition: Patient tolerated procedure well.    Return in about 3 weeks (around 07/26/2020) for with Dr Amalia Hailey.

## 2020-07-06 ENCOUNTER — Ambulatory Visit: Payer: Medicare HMO | Admitting: Podiatry

## 2020-07-07 ENCOUNTER — Ambulatory Visit: Payer: Medicare HMO | Admitting: Podiatry

## 2020-07-14 ENCOUNTER — Ambulatory Visit: Payer: Medicare HMO | Admitting: Pharmacist

## 2020-07-14 ENCOUNTER — Other Ambulatory Visit: Payer: Self-pay | Admitting: Family Medicine

## 2020-07-14 DIAGNOSIS — Z794 Long term (current) use of insulin: Secondary | ICD-10-CM

## 2020-07-14 DIAGNOSIS — I152 Hypertension secondary to endocrine disorders: Secondary | ICD-10-CM

## 2020-07-14 DIAGNOSIS — E118 Type 2 diabetes mellitus with unspecified complications: Secondary | ICD-10-CM | POA: Diagnosis not present

## 2020-07-14 DIAGNOSIS — E1169 Type 2 diabetes mellitus with other specified complication: Secondary | ICD-10-CM | POA: Diagnosis not present

## 2020-07-14 DIAGNOSIS — J432 Centrilobular emphysema: Secondary | ICD-10-CM

## 2020-07-14 DIAGNOSIS — E1159 Type 2 diabetes mellitus with other circulatory complications: Secondary | ICD-10-CM

## 2020-07-14 DIAGNOSIS — F324 Major depressive disorder, single episode, in partial remission: Secondary | ICD-10-CM | POA: Diagnosis not present

## 2020-07-14 DIAGNOSIS — F17200 Nicotine dependence, unspecified, uncomplicated: Secondary | ICD-10-CM

## 2020-07-14 DIAGNOSIS — E785 Hyperlipidemia, unspecified: Secondary | ICD-10-CM

## 2020-07-14 NOTE — Telephone Encounter (Signed)
Requested medication (s) are due for refill today:  Yes  Requested medication (s) are on the active medication list:  Yes  Future visit scheduled:  Yes  Last Refill: 06/14/20; 60 / refill x 1  Notes to clinic: medication is off protocol; please review/ advise.  Requested Prescriptions  Pending Prescriptions Disp Refills   TRELEGY ELLIPTA 100-62.5-25 MCG/INH AEPB [Pharmacy Med Name: TRELEGY ELLIPTA 100-62.5-25 MCG/INH] 60 each 1    Sig: INHALE 1 PUFF INTO THE LUNGS ONCE DAILY      Off-Protocol Failed - 07/14/2020 10:26 AM      Failed - Medication not assigned to a protocol, review manually.      Passed - Valid encounter within last 12 months    Recent Outpatient Visits           2 weeks ago Insomnia, unspecified type   [facility], [name], FNP   1 month ago Controlled type 2 diabetes mellitus with complication, with long-term current use of insulin [facility])   [facility], [name], FNP   5 months ago Right hand pain   [facility], [name], FNP   5 months ago Controlled type 2 diabetes mellitus with complication, with long-term current use of insulin [facility])   [place], FNP   5 months ago Ear pain, left   [facility], [name], FNP       Future Appointments             In 1 month Malfi, [name], [facility], [facility]

## 2020-07-14 NOTE — Chronic Care Management (AMB) (Signed)
Chronic Care Management   Follow Up Note   07/14/2020 Name: Kendra [name] MRN: [id number] DOB: [birth year]  Referred by: [name], FNP Reason for referral : Chronic Care Management (Patient Phone Call)   Kendra [name] is a 74 y.o. year old female who is a primary care patient of [name], [place]. The CCM team was consulted for assistance with chronic disease management and care coordination needs.    I reached out to [name] by phone today.   Review of patient status, including review of consultants reports, relevant laboratory and other test results, and collaboration with appropriate care team members and the patient's provider was performed as part of comprehensive patient evaluation and provision of chronic care management services.    SDOH (Social Determinants of Health) assessments performed: No See Care Plan activities for detailed interventions related to [facility])     Outpatient Encounter Medications as of 07/14/2020  Medication Sig  . lisinopril (ZESTRIL) 5 MG tablet Take 1 tablet (5 mg total) by mouth daily.  . metFORMIN (GLUCOPHAGE-XR) 500 MG 24 hr tablet TAKE 1 TABLET BY MOUTH TWICE DAILY BEFORE A MEAL  . traZODone (DESYREL) 50 MG tablet Take 0.5-1 tablets (25-50 mg total) by mouth at bedtime as needed for sleep.  . TRELEGY ELLIPTA 100-62.5-25 MCG/INH AEPB INHALE 1 PUFF INTO THE LUNGS ONCE DAILY  . albuterol (VENTOLIN HFA) 108 (90 Base) MCG/ACT inhaler INHALE 1-2 PUFFS EVERY 6 HOURS AS NEEDEDSHORTNESS OF BREATH AND WHEEZING  . aspirin 325 MG tablet Take 325 mg by mouth daily.  [name] atorvastatin (LIPITOR) 40 MG tablet TAKE 1 TABLET BY MOUTH ONCE DAILY  . Blood Glucose Monitoring Suppl (ONETOUCH VERIO) w/Device KIT USE AS DIRECTED  . buPROPion (WELLBUTRIN XL) 150 MG 24 hr tablet TAKE 1 TABLET BY MOUTH ONCE DAILY  . Cholecalciferol (VITAMIN D3) 50 MCG (2000 UT) capsule Take 1 capsule (2,000 Units total) by mouth daily.  . clopidogrel (PLAVIX) 75 MG  tablet TAKE 1 TABLET BY MOUTH ONCE DAILY  . cyclobenzaprine (FLEXERIL) 5 MG tablet Take 0.5-1 tablets (2.5-5 mg total) by mouth 3 (three) times daily as needed for muscle spasms.  . diclofenac Sodium (VOLTAREN) 1 % GEL APPLY 2 GRAMS TOPICALLY 4 TIMES DAILY (Patient not taking: Reported on 06/29/2020)  . gentamicin cream (GARAMYCIN) 0.1 % Apply 1 application topically 2 (two) times daily.  [name] glucose blood (ONETOUCH VERIO) test strip Use as instructed  . Lancet Devices (ONE TOUCH DELICA LANCING DEV) MISC 1 Device by Does not apply route 2 (two) times daily.  . mupirocin ointment (BACTROBAN) 2 % Apply 1 application topically daily.  . nicotine polacrilex (COMMIT) 4 MG lozenge Take 4 mg by mouth as needed for smoking cessation.  . ondansetron (ZOFRAN) 4 MG tablet Take 2 tablets (8 mg total) by mouth every 8 (eight) hours as needed for nausea or vomiting.  [name] Lancets 06T MISC 1 Device by Does not apply route 2 (two) times daily.   No facility-administered encounter medications on file as of 07/14/2020.    Goals Addressed            This Visit's Progress   . PharmD-Medication Adherence       Current Barriers:  . Financial Barriers . Knowledge deficits related to coordination of her own care . Limited social support   Pharmacist Clinical Goal(s):   [name] Over the next 30 days, patient will demonstrate improved medication adherence as evidenced by verbalized understanding of prescribed medication  regimen, assistance available, and patient report of adherence  Interventions: . Perform chart review . Follow up regarding insomnia ? Reports "my sleep is perfect" since starting on trazodone as prescribed by PCP. . Reports using cyclobenzaprine only as needed as prescribed by PCP  ? Counsel patient to use caution with taking cyclobenzaprine due to increased risk of dizziness/sedation. Particularly caution about risk of next day sedation due to long half-life of  cyclobenzaprine . Reports had a fall last week. Denies dizziness prior to fall, rather attributes fall to leaning on an unsteady table. Reports hit her nose with the fall, but denies bleeding. Reports called and was checked out by EMS. . Follow up regarding blood pressure monitoring ? Reports taking lisinopril 5 mg once daily from pill pack ? Reports monitoring home BP with upper arm monitor ? Last checked this morning: 125/87, HR 91 ? Counsel on BP monitoring technique, particularly on importance of resting prior to taking readings . Counsel on continued importance of blood sugar control  ? Reports taking metformin ER 500 mg twice daily from pill pack ? Reports morning this morning: 102 . Counsel on importance of medication adherence o Confirms continues to take medications as directed from pill packs as received from [name]  Reports has called to reorder, next pack arriving today  Again encourage patient to start using afternoon alarm on phone as additional adherence tool o Reports has recently not been using Trelegy every day. Counsel on importance of using maintenance Trelegy inhaler daily (and rinsing mouth out after each use) and rescue (albuterol) as needed as directed.  . Discuss health benefits of exercise and encourage patient to increase exercise to 30 minutes/day x 5 days/week o Reports currently walking around home and front yard for ~30 minutes/day 2-3 times/week . Counsel on importance of smoking cessation o Reports planned quit date for Thanksgiving o Reports currently smoking: ~1/2 cigarette/day  States "I take 2 pulls and put it out" a couple of times during the day o Using nicotine lozenges as needed as directed on package as cessation aid o Encourage patient to tell family and friends about planned quit date for support . Patient reports has recently changed her insurance to [employer] (HMO D-SNP). Note card has been scanned into chart . Coorination of care:  follow up regarding missed appointment with Hematology. Note patient referred to Hematology by PCP on 9/28 for evaluation of anemia o Patient reports missed an appointment to due a transportation issue related to her insurance change o Provide patient with phone number to [facility]. Encourage her to call to reschedule missed appointment and then call [employer] to schedule trasportation.  Patient Self Care Activities:  . Patient takes medications as directed with aid of adherence tools. o Using pill packaging from [facility] . Calls pharmacy for medication refills . Patient to attend scheduled medical appointments . Calls provider office for new concerns or questions  . Patient to check blood sugars and keep log . Patient to check blood pressure daily and keep log  Please see past updates related to this goal by clicking on the "Past Updates" button in the selected goal          Plan  Telephone follow up appointment with care management team member scheduled for: 11/29 at 9:45 am  Elisabeth Dhalla, PharmD, BCACP Clinical Pharmacist [facility] [phone] 

## 2020-07-14 NOTE — Patient Instructions (Signed)
Thank you allowing the Chronic Care Management Team to be a part of your care! It was a pleasure speaking with you today!     CCM (Chronic Care Management) Team    Kendra Larsson RN, MSN, CCM Nurse Care Coordinator  [phone]   Kendra [name] PharmD  Clinical Pharmacist  [phone]   [name] LCSW Clinical Social Worker [phone]  Visit Information  Goals Addressed            This Visit's Progress   . PharmD-Medication Adherence       Current Barriers:  . Financial Barriers . Knowledge deficits related to coordination of her own care . Limited social support   Pharmacist Clinical Goal(s):   [name] Over the next 30 days, patient will demonstrate improved medication adherence as evidenced by verbalized understanding of prescribed medication regimen, assistance available, and patient report of adherence  Interventions: . Perform chart review . Follow up regarding insomnia ? Reports "my sleep is perfect" since starting on trazodone as prescribed by PCP. [name] Reports using cyclobenzaprine only as needed as prescribed by PCP  ? Counsel patient to use caution with taking cyclobenzaprine due to increased risk of dizziness/sedation. Particularly caution about risk of next day sedation due to long half-life of cyclobenzaprine . Reports had a fall last week. Denies dizziness prior to fall, rather attributes fall to leaning on an unsteady table. Reports hit her nose with the fall, but denies bleeding. Reports called and was checked out by EMS. . Follow up regarding blood pressure monitoring ? Reports taking lisinopril 5 mg once daily from pill pack ? Reports monitoring home BP with upper arm monitor ? Last checked this morning: 125/87, HR 91 ? Counsel on BP monitoring technique, particularly on importance of resting prior to taking readings . Counsel on continued importance of blood sugar control  ? Reports taking metformin ER 500 mg twice daily from pill pack ? Reports  morning this morning: 102 . Counsel on importance of medication adherence o Confirms continues to take medications as directed from pill packs as received from [facility] has called to reorder, next pack arriving today  Again encourage patient to start using afternoon alarm on phone as additional adherence tool o Reports has recently not been using Trelegy every day. Counsel on importance of using maintenance Trelegy inhaler daily (and rinsing mouth out after each use) and rescue (albuterol) as needed as directed.  . Discuss health benefits of exercise and encourage patient to increase exercise to 30 minutes/day x 5 days/week o Reports currently walking around home and front yard for ~30 minutes/day 2-3 times/week . Counsel on importance of smoking cessation o Reports planned quit date for Thanksgiving o Reports currently smoking: ~1/2 cigarette/day  States "I take 2 pulls and put it out" a couple of times during the day o Using nicotine lozenges as needed as directed on package as cessation aid o Encourage patient to tell family and friends about planned quit date for support . Patient reports has recently changed her insurance to [employer] [name] D-SNP). Note card has been scanned into chart . Coorination of care: follow up regarding missed appointment with Hematology. Note patient referred to Hematology by PCP on 9/28 for evaluation of anemia o Patient reports missed an appointment to due a transportation issue related to her insurance change o Provide patient with phone number to [facility]. Encourage her to call to reschedule missed appointment and then call [employer] to schedule  trasportation.  Patient Self Care Activities:  . Patient takes medications as directed with aid of adherence tools. o Using pill packaging from [facility] . Calls pharmacy for medication refills . Patient to attend scheduled medical appointments . Calls provider office for  new concerns or questions  . Patient to check blood sugars and keep log . Patient to check blood pressure daily and keep log  Please see past updates related to this goal by clicking on the "Past Updates" button in the selected goal          Patient verbalizes understanding of instructions provided today.   Telephone follow up appointment with care management team member scheduled for: 11/29 at 9:45 am  Kendra [name], PharmD, [place] [phone]

## 2020-07-20 ENCOUNTER — Encounter: Payer: Self-pay | Admitting: Oncology

## 2020-07-20 ENCOUNTER — Inpatient Hospital Stay: Payer: Medicare HMO

## 2020-07-20 ENCOUNTER — Inpatient Hospital Stay: Payer: Medicare HMO | Attending: Oncology | Admitting: Oncology

## 2020-07-20 ENCOUNTER — Other Ambulatory Visit: Payer: Self-pay

## 2020-07-20 VITALS — BP 125/84 | HR 78 | Temp 96.5°F | Resp 18 | Wt 123.3 lb

## 2020-07-20 DIAGNOSIS — M069 Rheumatoid arthritis, unspecified: Secondary | ICD-10-CM | POA: Insufficient documentation

## 2020-07-20 DIAGNOSIS — D649 Anemia, unspecified: Secondary | ICD-10-CM | POA: Diagnosis not present

## 2020-07-20 DIAGNOSIS — Z87891 Personal history of nicotine dependence: Secondary | ICD-10-CM | POA: Diagnosis not present

## 2020-07-20 DIAGNOSIS — R6889 Other general symptoms and signs: Secondary | ICD-10-CM | POA: Diagnosis not present

## 2020-07-20 DIAGNOSIS — Z79899 Other long term (current) drug therapy: Secondary | ICD-10-CM | POA: Insufficient documentation

## 2020-07-20 DIAGNOSIS — F17210 Nicotine dependence, cigarettes, uncomplicated: Secondary | ICD-10-CM | POA: Insufficient documentation

## 2020-07-20 LAB — VITAMIN B12: Vitamin B-12: 173 pg/mL — ABNORMAL LOW (ref 180–914)

## 2020-07-20 LAB — CBC WITH DIFFERENTIAL/PLATELET
Abs Immature Granulocytes: 0.04 10*3/uL (ref 0.00–0.07)
Basophils Absolute: 0 10*3/uL (ref 0.0–0.1)
Basophils Relative: 1 %
Eosinophils Absolute: 0.1 10*3/uL (ref 0.0–0.5)
Eosinophils Relative: 3 %
HCT: 34.4 % — ABNORMAL LOW (ref 36.0–46.0)
Hemoglobin: 11.4 g/dL — ABNORMAL LOW (ref 12.0–15.0)
Immature Granulocytes: 1 %
Lymphocytes Relative: 33 %
Lymphs Abs: 1.5 10*3/uL (ref 0.7–4.0)
MCH: 31.1 pg (ref 26.0–34.0)
MCHC: 33.1 g/dL (ref 30.0–36.0)
MCV: 93.7 fL (ref 80.0–100.0)
Monocytes Absolute: 0.3 10*3/uL (ref 0.1–1.0)
Monocytes Relative: 6 %
Neutro Abs: 2.6 10*3/uL (ref 1.7–7.7)
Neutrophils Relative %: 56 %
Platelets: 238 10*3/uL (ref 150–400)
RBC: 3.67 MIL/uL — ABNORMAL LOW (ref 3.87–5.11)
RDW: 13.4 % (ref 11.5–15.5)
WBC: 4.6 10*3/uL (ref 4.0–10.5)
nRBC: 0 % (ref 0.0–0.2)

## 2020-07-20 LAB — COMPREHENSIVE METABOLIC PANEL
ALT: 13 U/L (ref 0–44)
AST: 20 U/L (ref 15–41)
Albumin: 4.4 g/dL (ref 3.5–5.0)
Alkaline Phosphatase: 67 U/L (ref 38–126)
Anion gap: 9 (ref 5–15)
BUN: 16 mg/dL (ref 8–23)
CO2: 28 mmol/L (ref 22–32)
Calcium: 9.5 mg/dL (ref 8.9–10.3)
Chloride: 102 mmol/L (ref 98–111)
Creatinine, Ser: 0.66 mg/dL (ref 0.44–1.00)
GFR, Estimated: >60 mL/min (ref >60)
Glucose, Bld: 118 mg/dL — ABNORMAL HIGH (ref 70–99)
Potassium: 3.8 mmol/L (ref 3.5–5.1)
Sodium: 139 mmol/L (ref 135–145)
Total Bilirubin: 0.4 mg/dL (ref 0.3–1.2)
Total Protein: 7.5 g/dL (ref 6.5–8.1)

## 2020-07-20 LAB — RETIC PANEL
Immature Retic Fract: 5.2 % (ref 2.3–15.9)
RBC.: 3.69 MIL/uL — ABNORMAL LOW (ref 3.87–5.11)
Retic Count, Absolute: 48.3 10*3/uL (ref 19.0–186.0)
Retic Ct Pct: 1.3 % (ref 0.4–3.1)
Reticulocyte Hemoglobin: 35 pg (ref >27.9)

## 2020-07-20 LAB — IRON AND TIBC
Iron: 65 ug/dL (ref 28–170)
Saturation Ratios: 17 % (ref 10.4–31.8)
TIBC: 395 ug/dL (ref 250–450)
UIBC: 330 ug/dL

## 2020-07-20 LAB — FOLATE: Folate: 12.8 ng/mL (ref >5.9)

## 2020-07-20 LAB — TECHNOLOGIST SMEAR REVIEW: Plt Morphology: ADEQUATE

## 2020-07-20 LAB — FERRITIN: Ferritin: 33 ng/mL (ref 11–307)

## 2020-07-20 LAB — LACTATE DEHYDROGENASE: LDH: 93 U/L — ABNORMAL LOW (ref 98–192)

## 2020-07-20 NOTE — Progress Notes (Signed)
Hematology/Oncology Consult note [facility] Telephone:([phone] [phone]   Patient Care Team: [name], FNP as PCP - General (Family Medicine) Steele Sizer, MD as Attending Physician (Family Medicine) Christene Lye, MD (General Surgery) [place], Virl Diamond, [facility] as Pharmacist Greg Cutter, LCSW as Social Worker (Licensed Clinical Social Worker) Vanita Ingles, RN as Case Manager ([place]) Malfi, [name], FNP as Nurse Practitioner (Family Medicine)  REFERRING PROVIDER: [name] [name], FNP  CHIEF COMPLAINTS/REASON FOR VISIT:  Evaluation of anemia  HISTORY OF PRESENTING ILLNESS:  Kendra [name] is a  74 y.o.  female with PMH listed below who was referred to me for evaluation of anemia Reviewed patient's recent labs that was done.  06/14/20 Labs revealed anemia with hemoglobin of 11.2  Reviewed patient's previous labs ordered by primary care physician's office, anemia is chronic onset , duration is since Jan 2021 No aggravating or improving factors.  Associated signs and symptoms: Patient reports fatigue. deneis  SOB with exertion.  Denies  easy bruising, hematochezia, hemoptysis, hematuria. She has had lost weight over the past 2 years. Context: History of GI bleeding: denies               History of Chronic kidney disease: deneis               History of autoimmune disease: Rheumatoid arthritis               Last colonoscopy: 2007                Extensive smoking history  Review of Systems  Constitutional: Positive for fatigue. Negative for appetite change, chills and fever.  HENT:   Negative for hearing loss and voice change.   Eyes: Negative for eye problems.  Respiratory: Negative for chest tightness and cough.   Cardiovascular: Negative for chest pain.  Gastrointestinal: Negative for abdominal distention, abdominal pain and blood in stool.  Endocrine: Negative for hot flashes.  Genitourinary:  Negative for difficulty urinating and frequency.   Musculoskeletal: Positive for arthralgias.  Skin: Negative for itching and rash.  Neurological: Negative for extremity weakness.  Hematological: Negative for adenopathy.  Psychiatric/Behavioral: Negative for confusion.     MEDICAL HISTORY:  Past Medical History:  Diagnosis Date  . Asthma   . Back pain   . Cataract   . Cervical disc disorder   . COPD (chronic obstructive pulmonary disease) ([place])   . Depression   . Diabetes ([place])    type II  . Diabetic neuropathy ([place])   . Dyspnea    with exertion  . GERD (gastroesophageal reflux disease)   . History of kidney stones    per patient "can't remember the year"  . Hyperlipidemia   . Hypertension    per patient "take meds for it"  . Insomnia   . Neuropathy   . Osteoarthritis   . Osteopenia   . Pneumonia 2018   per patient  . Reflux   . Rheumatoid arthritis ([place])   . Stroke ([place]) 2015   and again 2017  - Weakness in left leg, staggers w/ walking, vision   . Tenosynovitis     SURGICAL HISTORY: Past Surgical History:  Procedure Laterality Date  . BREAST BIOPSY Right    Fatty Tumor  . ENDARTERECTOMY Right 10/21/2019   Procedure: ENDARTERECTOMY CAROTID RIGHT;  Surgeon: [name], MD;  Location: [place];  Service: Vascular;  Laterality: Right;  . ENDARTERECTOMY Left 12/30/2019  Procedure: LEFT CAROTID ENDARTERECTOMY;  Surgeon: [name], MD;  Location: [place];  Service: Vascular;  Laterality: Left;  . NECK SURGERY    . OVARIAN CYST REMOVAL    . PATCH ANGIOPLASTY Right 10/21/2019   Procedure: Patch Angioplasty Using XenoSure Biologic Patch Right Carotid;  Surgeon: [name], MD;  Location: [place];  Service: Vascular;  Laterality: Right;  . PATCH ANGIOPLASTY Left 12/30/2019   Procedure: Patch Angioplasty Left Carotid;  Surgeon: [name], MD;  Location: [place];  Service: Vascular;  Laterality: Left;    SOCIAL  HISTORY: Social History   Socioeconomic History  . Marital status: Widowed    Spouse name: Not on file  . Number of children: Not on file  . Years of education: Not on file  . Highest education level: Not on file  Occupational History  . Not on file  Tobacco Use  . Smoking status: Current Some Day Smoker    Years: 50.00    Types: Cigarettes  . Smokeless tobacco: Former Systems developer    Types: Snuff  . Tobacco comment: .5 of 1 cigarette a day  Vaping Use  . Vaping Use: Never used  Substance and Sexual Activity  . Alcohol use: No  . Drug use: No  . Sexual activity: Never  Other Topics Concern  . Not on file  Social History Narrative  . Not on file   Social Determinants of Health   Financial Resource Strain:   . Difficulty of Paying Living Expenses: Not on file  Food Insecurity:   . Worried About Charity fundraiser in the Last Year: Not on file  . Ran Out of Food in the Last Year: Not on file  Transportation Needs:   . Lack of Transportation (Medical): Not on file  . Lack of Transportation (Non-Medical): Not on file  Physical Activity:   . Days of Exercise per Week: Not on file  . Minutes of Exercise per Session: Not on file  Stress:   . Feeling of Stress : Not on file  Social Connections:   . Frequency of Communication with Friends and Family: Not on file  . Frequency of Social Gatherings with Friends and Family: Not on file  . Attends Religious Services: Not on file  . Active Member of Clubs or Organizations: Not on file  . Attends Archivist Meetings: Not on file  . Marital Status: Not on file  Intimate Partner Violence:   . Fear of Current or Ex-Partner: Not on file  . Emotionally Abused: Not on file  . Physically Abused: Not on file  . Sexually Abused: Not on file    FAMILY HISTORY: Family History  Problem Relation Age of Onset  . Stroke Father   . Depression Father   . Diabetes Mother   . Hyperlipidemia Mother   . Breast cancer Mother   . Heart  murmur Son   . Heart murmur Son   . Heart murmur Son     ALLERGIES:  is allergic to citalopram.  MEDICATIONS:  Current Outpatient Medications  Medication Sig Dispense Refill  . albuterol (VENTOLIN HFA) 108 (90 Base) MCG/ACT inhaler INHALE 1-2 PUFFS EVERY 6 HOURS AS NEEDEDSHORTNESS OF BREATH AND WHEEZING 8.5 g 1  . aspirin 325 MG tablet Take 325 mg by mouth daily.    [name] atorvastatin (LIPITOR) 40 MG tablet TAKE 1 TABLET BY MOUTH ONCE DAILY 90 tablet 0  . Blood Glucose Monitoring Suppl (ONETOUCH VERIO) w/Device KIT USE AS  DIRECTED 1 kit 0  . buPROPion (WELLBUTRIN XL) 150 MG 24 hr tablet TAKE 1 TABLET BY MOUTH ONCE DAILY 90 tablet 0  . Cholecalciferol (VITAMIN D3) 50 MCG (2000 UT) capsule Take 1 capsule (2,000 Units total) by mouth daily. 90 capsule 1  . clopidogrel (PLAVIX) 75 MG tablet TAKE 1 TABLET BY MOUTH ONCE DAILY 90 tablet 0  . cyclobenzaprine (FLEXERIL) 5 MG tablet Take 0.5-1 tablets (2.5-5 mg total) by mouth 3 (three) times daily as needed for muscle spasms. 30 tablet 1  . gentamicin cream (GARAMYCIN) 0.1 % Apply 1 application topically 2 (two) times daily. 30 g 1  . glucose blood (ONETOUCH VERIO) test strip Use as instructed 200 each 4  . Lancet Devices (ONE TOUCH DELICA LANCING DEV) MISC 1 Device by Does not apply route 2 (two) times daily. 200 each 4  . lisinopril (ZESTRIL) 5 MG tablet Take 1 tablet (5 mg total) by mouth daily. 90 tablet 3  . metFORMIN (GLUCOPHAGE-XR) 500 MG 24 hr tablet TAKE 1 TABLET BY MOUTH TWICE DAILY BEFORE A MEAL 60 tablet 3  . mupirocin ointment (BACTROBAN) 2 % Apply 1 application topically daily. 22 g 0  . nicotine polacrilex (COMMIT) 4 MG lozenge Take 4 mg by mouth as needed for smoking cessation.    . ondansetron (ZOFRAN) 4 MG tablet Take 2 tablets (8 mg total) by mouth every 8 (eight) hours as needed for nausea or vomiting. 20 tablet 0  . OneTouch Delica Lancets 47M MISC 1 Device by Does not apply route 2 (two) times daily. 200 each 4  . traZODone  (DESYREL) 50 MG tablet Take 0.5-1 tablets (25-50 mg total) by mouth at bedtime as needed for sleep. 30 tablet 1  . TRELEGY ELLIPTA 100-62.5-25 MCG/INH AEPB INHALE 1 PUFF INTO THE LUNGS ONCE DAILY 60 each 1  . diclofenac Sodium (VOLTAREN) 1 % GEL APPLY 2 GRAMS TOPICALLY 4 TIMES DAILY (Patient not taking: Reported on 06/29/2020) 100 g 0   No current facility-administered medications for this visit.     PHYSICAL EXAMINATION: ECOG PERFORMANCE STATUS: 1 - Symptomatic but completely ambulatory Vitals:   07/20/20 0947  BP: 125/84  Pulse: 78  Resp: 18  Temp: (!) 96.5 F (35.8 C)   Filed Weights   07/20/20 0947  Weight: 123 lb 4.8 oz (55.9 kg)    Physical Exam Constitutional:      General: She is not in acute distress.    Comments: Thin built  HENT:     Head: Normocephalic and atraumatic.  Eyes:     General: No scleral icterus. Cardiovascular:     Rate and Rhythm: Normal rate and regular rhythm.     Heart sounds: Normal heart sounds.  Pulmonary:     Effort: Pulmonary effort is normal. No respiratory distress.     Breath sounds: No wheezing.  Abdominal:     General: Bowel sounds are normal. There is no distension.     Palpations: Abdomen is soft.  Musculoskeletal:        General: No deformity. Normal range of motion.     Cervical back: Normal range of motion and neck supple.  Skin:    General: Skin is warm and dry.     Findings: No erythema or rash.  Neurological:     Mental Status: She is alert. Mental status is at baseline.     Cranial Nerves: No cranial nerve deficit.     Coordination: Coordination normal.  Psychiatric:  Mood and Affect: Mood normal.      LABORATORY DATA:  I have reviewed the data as listed Lab Results  Component Value Date   WBC 4.6 07/20/2020   HGB 11.4 (L) 07/20/2020   HCT 34.4 (L) 07/20/2020   MCV 93.7 07/20/2020   PLT 238 07/20/2020   Recent Labs    10/16/19 1717 10/21/19 0517 12/30/19 1116 12/30/19 1116 12/31/19 0450  01/15/20 1324 02/06/20 0735 06/14/20 0929 07/20/20 1018  NA 141   < > 142   < >   < > 137 141 141 139  K 3.7   < > 4.3   < >   < > 3.9 4.7 4.1 3.8  CL 106   < > 106   < >   < > 105 105 105 102  CO2 25   < > 28   < >   < > 25 32 27 28  GLUCOSE 102*   < > 107*   < >   < > 108* 110* 114* 118*  BUN 14   < > 9   < >   < > $R'19 21 16 16  'cA$ CREATININE 0.78   < > 0.76  --    < > 0.80 0.72 0.83 0.66  CALCIUM 9.6   < > 10.0   < >   < > 9.5 10.1 9.5 9.5  GFRNONAA >60   < > >60  --    < > >60 84 70 >60  GFRAA >60   < > >60  --    < > >60 98 82  --   PROT 7.2  --  7.4   < >  --   --  6.5 6.6 7.5  ALBUMIN 4.0  --  4.2  --   --   --   --   --  4.4  AST 28  --  21   < >  --   --  $R'17 17 20  'Gp$ ALT 26  --  23   < >  --   --  $R'15 17 13  'Wc$ ALKPHOS 65  --  73  --   --   --   --   --  67  BILITOT 0.6  --  0.6   < >  --   --  0.3 0.5 0.4   < > = values in this interval not displayed.   Iron/TIBC/Ferritin/ %Sat    Component Value Date/Time   IRON 65 07/20/2020 1018   TIBC 395 07/20/2020 1018   FERRITIN 33 07/20/2020 1018   IRONPCTSAT 17 07/20/2020 1018        ASSESSMENT & PLAN:  1. Anemia, unspecified type   2. History of cigarette smoking     Anemia: multifactorial with possible causes including chronic blood loss, hyper/hypothyroidism, nutritional deficiency, infection/chronic inflammation, hemolysis, underlying bone marrow disorders. The chronic inflammation from her rheumatoid arthritis probably attributes to her anemia.  Will check CBC w differential, CMP, vitamin B12, Folate, iron/TIBC, ferritin, reticulocytes, blood smear, LDH,monoclonal gammopathy evaluation.    # extensive smoking history: smoking cessation was discussed.  Refer to lung cancer screening program.   Orders Placed This Encounter  Procedures  . Iron and TIBC    Standing Status:   Future    Number of Occurrences:   1    Standing Expiration Date:   07/20/2021  . Ferritin    Standing Status:   Future    Number of Occurrences:  1    Standing Expiration Date:   07/20/2021  . Folate    Standing Status:   Future    Number of Occurrences:   1    Standing Expiration Date:   07/20/2021  . Vitamin B12    Standing Status:   Future    Number of Occurrences:   1    Standing Expiration Date:   07/20/2021  . Technologist smear review    Standing Status:   Future    Number of Occurrences:   1    Standing Expiration Date:   07/20/2021  . Comprehensive metabolic panel    Standing Status:   Future    Number of Occurrences:   1    Standing Expiration Date:   07/20/2021  . CBC with Differential/Platelet    Standing Status:   Future    Number of Occurrences:   1    Standing Expiration Date:   07/20/2021  . Retic Panel    Standing Status:   Future    Number of Occurrences:   1    Standing Expiration Date:   07/20/2021  . Lactate dehydrogenase    Standing Status:   Future    Number of Occurrences:   1    Standing Expiration Date:   07/20/2021  . Multiple Myeloma Panel (SPEP&IFE w/QIG)    Standing Status:   Future    Number of Occurrences:   1    Standing Expiration Date:   07/20/2021  . Kappa/lambda light chains    Standing Status:   Future    Number of Occurrences:   1    Standing Expiration Date:   07/20/2021  . Ambulatory Referral for Lung Cancer Screening    Referral Priority:   Routine    Referral Type:   Consultation    Referral Reason:   Specialty Services Required    Number of Visits Requested:   1    All questions were answered. The patient knows to call the clinic with any problems questions or concerns. Cc. Malfi, [name], FNP  Return of visit: 2 weeks Thank you for this kind referral and the opportunity to participate in the care of this patient. A copy of today's note is routed to referring provider    Earlie Server, MD, PhD 07/20/2020

## 2020-07-20 NOTE — Progress Notes (Signed)
Patient here to establish care for normocytic anemia. Medication reconciliation done with patient's medications on hand.

## 2020-07-21 ENCOUNTER — Other Ambulatory Visit: Payer: Self-pay | Admitting: Oncology

## 2020-07-21 LAB — MULTIPLE MYELOMA PANEL, SERUM
Albumin SerPl Elph-Mcnc: 3.9 g/dL (ref 2.9–4.4)
Albumin/Glob SerPl: 1.4 (ref 0.7–1.7)
Alpha 1: 0.3 g/dL (ref 0.0–0.4)
Alpha2 Glob SerPl Elph-Mcnc: 0.8 g/dL (ref 0.4–1.0)
B-Globulin SerPl Elph-Mcnc: 1 g/dL (ref 0.7–1.3)
Gamma Glob SerPl Elph-Mcnc: 0.9 g/dL (ref 0.4–1.8)
Globulin, Total: 3 g/dL (ref 2.2–3.9)
IgA: 217 mg/dL (ref 64–422)
IgG (Immunoglobin G), Serum: 774 mg/dL (ref 586–1602)
IgM (Immunoglobulin M), Srm: 106 mg/dL (ref 26–217)
Total Protein ELP: 6.9 g/dL (ref 6.0–8.5)

## 2020-07-21 LAB — KAPPA/LAMBDA LIGHT CHAINS
Kappa free light chain: 55.9 mg/L — ABNORMAL HIGH (ref 3.3–19.4)
Kappa, lambda light chain ratio: 4.74 — ABNORMAL HIGH (ref 0.26–1.65)
Lambda free light chains: 11.8 mg/L (ref 5.7–26.3)

## 2020-07-22 ENCOUNTER — Telehealth: Payer: Self-pay

## 2020-07-22 NOTE — Telephone Encounter (Signed)
-----   Message from Earlie Server, MD sent at 07/21/2020  9:34 PM EDT ----- Please add vitamin b12 injection to her next visit.

## 2020-07-22 NOTE — Telephone Encounter (Signed)
Done. B12 INJ has been added to pts 08/02/20 appt

## 2020-07-22 NOTE — Telephone Encounter (Signed)
Please add B12 injection encounter to next MD appt.

## 2020-07-26 ENCOUNTER — Telehealth: Payer: Self-pay

## 2020-07-26 DIAGNOSIS — Z122 Encounter for screening for malignant neoplasm of respiratory organs: Secondary | ICD-10-CM

## 2020-07-26 DIAGNOSIS — Z87891 Personal history of nicotine dependence: Secondary | ICD-10-CM

## 2020-07-26 NOTE — Telephone Encounter (Signed)
Contacted patient for lung screening CT program based on referral from Dr. Tasia Catchings.  Patient agreeable to participate in program and CT scheduled for Dec 15 at 10:45.  Address to imaging center and phone number to [facility] given to patient.  Patient is a current smoker who started smoking at age 74 or 57.  She smokes 1/2 pack cigarettes a day.  She states she smokes 1/2 of a cigarette, then relights the remainder at a later time, but the amount equals 1/2 pack. She confirms she has [employer].

## 2020-07-27 ENCOUNTER — Ambulatory Visit: Payer: Medicare HMO | Admitting: Podiatry

## 2020-07-27 NOTE — Addendum Note (Signed)
Addended by: Lieutenant Diego on: 07/27/2020 12:46 PM   Modules accepted: Orders

## 2020-07-27 NOTE — Telephone Encounter (Signed)
Current smoker, 30.5 pack year

## 2020-07-29 ENCOUNTER — Other Ambulatory Visit: Payer: Self-pay

## 2020-07-29 DIAGNOSIS — I6523 Occlusion and stenosis of bilateral carotid arteries: Secondary | ICD-10-CM

## 2020-08-02 ENCOUNTER — Inpatient Hospital Stay: Payer: Medicare HMO | Admitting: Oncology

## 2020-08-02 ENCOUNTER — Inpatient Hospital Stay: Payer: Medicare HMO

## 2020-08-03 ENCOUNTER — Other Ambulatory Visit: Payer: Self-pay | Admitting: Family Medicine

## 2020-08-03 DIAGNOSIS — G4700 Insomnia, unspecified: Secondary | ICD-10-CM

## 2020-08-03 MED ORDER — TRAZODONE HCL 50 MG PO TABS: 25.0000 mg | ORAL_TABLET | Freq: Every evening | ORAL | 1 refills | Status: DC | PRN

## 2020-08-03 NOTE — Telephone Encounter (Signed)
Copied from [place] [phone]. Topic: Quick Communication - Rx Refill/Question >> Aug 03, 2020  9:09 AM Yvette Rack wrote: Medication: traZODone (DESYREL) 50 MG tablet  Has the patient contacted their pharmacy? no  Preferred Pharmacy (with phone number or street name): [place], [place]. Phone: [phone]   Fax: [phone]  Agent: Please be advised that RX refills may take up to 3 business days. We ask that you follow-up with your pharmacy.

## 2020-08-05 ENCOUNTER — Ambulatory Visit: Payer: Self-pay | Admitting: General Practice

## 2020-08-05 ENCOUNTER — Telehealth: Payer: Self-pay | Admitting: Family Medicine

## 2020-08-05 ENCOUNTER — Telehealth: Payer: Self-pay | Admitting: General Practice

## 2020-08-05 DIAGNOSIS — J449 Chronic obstructive pulmonary disease, unspecified: Secondary | ICD-10-CM

## 2020-08-05 DIAGNOSIS — I6523 Occlusion and stenosis of bilateral carotid arteries: Secondary | ICD-10-CM

## 2020-08-05 DIAGNOSIS — J432 Centrilobular emphysema: Secondary | ICD-10-CM

## 2020-08-05 DIAGNOSIS — Z794 Long term (current) use of insulin: Secondary | ICD-10-CM

## 2020-08-05 DIAGNOSIS — E1169 Type 2 diabetes mellitus with other specified complication: Secondary | ICD-10-CM

## 2020-08-05 DIAGNOSIS — I152 Hypertension secondary to endocrine disorders: Secondary | ICD-10-CM

## 2020-08-05 DIAGNOSIS — E118 Type 2 diabetes mellitus with unspecified complications: Secondary | ICD-10-CM

## 2020-08-05 DIAGNOSIS — E1159 Type 2 diabetes mellitus with other circulatory complications: Secondary | ICD-10-CM

## 2020-08-05 NOTE — Patient Instructions (Signed)
Visit Information  Goals Addressed              This Visit's Progress   .  RNCM-Pt "My blood pressures have been stable" (pt-stated)        Current Barriers:  [name] Knowledge Deficits related to basic understanding of hypertension pathophysiology and self care management . Knowledge Deficits related to understanding of medications prescribed for management of hypertension . Non-adherence to prescribed medication regimen . Limited ability to follow a low sodium diet . Film/video editor.  . Transportation barrier  Case Manager Clinical Goal(s):  [name] Over the next 90 days, patient will demonstrate improved adherence to prescribed treatment plan for hypertension as evidenced by taking all medications as prescribed, monitoring and recording blood pressure as directed, adhering to low sodium/DASH diet . Over the next 90 days the patient will utilize the [employer] Benefit with the ability to prepare heart healthy/low sodium diet in home setting . Over the next 30 days the patient will have follow up appointments with pcp and surgeon post right endarterectomy and hospitalization recently- completed  Interventions:  . Evaluation of current treatment plan related to hypertension self management and patient's adherence to plan as established by provider. The patient states her headaches have resolved at this time. 08-05-2020: The patient gave several blood pressure readings. She has had a range of 277-824 systolic and 72 to 94 diastolic. She states she needs more paper to write down her readings. Will supply log record for her at her next appointment or mail her some copies of the log record. She is keeping a good record of her readings.  . Discussed plans with patient for ongoing care management follow up and provided patient with direct contact information for care management team . Advised patient, providing education and rationale, to monitor blood pressure daily and record, the patient stated  the pcp told her to go to the ED for SBP>199, see updated vital sign record for readings over the last several days . Advised patient to call [facility] for the "Healthy Foods Benefit" ($55.00) credit per month for buying food  . Patient continues to check b/p BID and is following pharmacy recommendations to recheck after 20mins if it is high. The patient is keeping a log and can tell the RNCM her readings. 08-05-2020: See VS log for several readings provided by the patient today. Blood pressure this am was 120/78 with pulse of 75 Praised for taking and recording.  o BP Readings from Last 3 Encounters: o  08/05/20 o 120/78 o  07/20/20 o 125/84 o  06/29/20 o 118/81 o     . Evaluated heart healthy diet with the patient. The patient verbalized she will be getting 15 meals a week that are low sodium and this switch will take place at the end of the week. 08-05-2020: The patient states they cut her off on her meals. Something about "too many people coming to the door".  Education provided that the [facility] would get the Care guides to check into this and also help her with other expressed needs. . Care guide referral for assistance and help with transportation needs related to transportation needs. They have helped her in the past with transportation. New referral placed today.  . Evaluation of heart healthy/ADA diet. The patient states that she is doing well with her diet. Praised for positive changes in health and well being.            Patient Self Care Activities:  . UNABLE  to independently manage worsening headaches . Checks BP and records as discussed  Please see past updates related to this goal by clicking on the "Past Updates" button in the selected goal       .  RNCM: pt-"I feel like finding a new place to live will help me with my health" (pt-stated)        [place] (see longtitudinal plan of care for additional care plan information)  Current Barriers:  . Chronic Disease Management  support, education, and care coordination needs related to HLD, DMII, and Depression  Clinical Goal(s) related to HLD, DMII, and Depression:  Over the next 120 days, patient will:  . Work with the care management team to address educational, disease management, and care coordination needs  . Begin or continue self health monitoring activities as directed today  adhere to heart healthy/ADA diet . Call provider office for new or worsened signs and symptoms New or worsened symptom related to HLD/Depression and other chronic conditions . Call care management team with questions or concerns . Verbalize basic understanding of patient centered plan of care established today  Interventions related to HLD, DMII, and Depression:  . Evaluation of current treatment plans and patient's adherence to plan as established by provider.  The patient states she is doing well but still needs a new place to live. 08-05-2020: The patient is doing well and denies any new concerns. She has been keeping an accurate log of her VS and blood sugars. Praised for staying on top of her health and well being. Has a follow up with pcp on 08-26-2020.  . Assessed patient understanding of disease states.  The patient feels she is doing very well at this time.  She states her blood sugars have been good also. The range has been 64 to 106 with a reading of 75 this am.  The patient praised for taking regularly and recording. She has been working hard to maintain her health and well being.  . Assessed patient's education and care coordination needs. 05-27-2020: The patient is still looking for housing. The care guides have worked with her in the past. They have re-mailed her paperwork for housing in the area.  They have also sent information to the [employer] representative about getting her some glucerna. She had ask her granddaughter to take her OTC card to get her some and they would not take it at the pharmacy. Explained that she would need to  use it at [facility] or order on line. The patient verbalized understanding. 08-05-2020: Care guide referral for help with transportation and food resources. She is no longer with [facility] but has [employer] coverage and has a card she can use for 75.00 to get food and other things.  . Provided disease specific education to patient.  Education on smoking cessation and the benefit of smoking cessation on her overall health and well being. The patient states she has cut down considerably. Education and support.  Nash Dimmer with appropriate clinical care team members regarding patient needs . Assessed housing situation. The patient feels she needs to find a new place to live.  She feels safe in her current environment but stays to herself. When talking to the LCSW last week she expressed wanting help with finding a new place to live. A care guide referral has been placed to help with finding new housing. 03-29-2020: The patient states she did not receive the housing information. Looked at the referral and the information was sent to  her sons email address. The patient ask if the information could be sent to her mailing address. A new referral sent to the care guides to see if a copy of the resources could be mailed to her mailing address. She does not want another apartment.  Education and support given.   Patient Self Care Activities related to HLD, DMII, and Depression:  . Patient is unable to independently self-manage chronic health conditions  Please see past updates related to this goal by clicking on the "Past Updates" button in the selected goal         The patient verbalized understanding of instructions, educational materials, and care plan provided today and declined offer to receive copy of patient instructions, educational materials, and care plan.   Telephone follow up appointment with care management team member scheduled for:09-30-2020 at 11:15 am  [place], MSN, [place] Mobile: [phone]

## 2020-08-05 NOTE — Chronic Care Management (AMB) (Signed)
Chronic Care Management   Follow Up Note   08/05/2020 Name: JOYDAN GRETZINGER MRN: [id number] DOB: 07-01-1946  Referred by: [name], FNP Reason for referral : Chronic Care Management (RNCM Follow up for Chronic Disease Management and Care Care Coordination Needs)   Sharada Albornoz Brendel is a 74 y.o. year old female who is a primary care patient of [name], FNP. The CCM team was consulted for assistance with chronic disease management and care coordination needs.    Review of patient status, including review of consultants reports, relevant laboratory and other test results, and collaboration with appropriate care team members and the patient's provider was performed as part of comprehensive patient evaluation and provision of chronic care management services.    SDOH (Social Determinants of Health) assessments performed: Yes See Care Plan activities for detailed interventions related to [facility])     Outpatient Encounter Medications as of 08/05/2020  Medication Sig   albuterol (VENTOLIN HFA) 108 (90 Base) MCG/ACT inhaler INHALE 1-2 PUFFS EVERY 6 HOURS AS NEEDEDSHORTNESS OF BREATH AND WHEEZING   aspirin 325 MG tablet Take 325 mg by mouth daily.   atorvastatin (LIPITOR) 40 MG tablet TAKE 1 TABLET BY MOUTH ONCE DAILY   Blood Glucose Monitoring Suppl (ONETOUCH VERIO) w/Device KIT USE AS DIRECTED   buPROPion (WELLBUTRIN XL) 150 MG 24 hr tablet TAKE 1 TABLET BY MOUTH ONCE DAILY   Cholecalciferol (VITAMIN D3) 50 MCG (2000 UT) capsule Take 1 capsule (2,000 Units total) by mouth daily.   clopidogrel (PLAVIX) 75 MG tablet TAKE 1 TABLET BY MOUTH ONCE DAILY   cyclobenzaprine (FLEXERIL) 5 MG tablet Take 0.5-1 tablets (2.5-5 mg total) by mouth 3 (three) times daily as needed for muscle spasms.   diclofenac Sodium (VOLTAREN) 1 % GEL APPLY 2 GRAMS TOPICALLY 4 TIMES DAILY (Patient not taking: Reported on 06/29/2020)   gentamicin cream (GARAMYCIN) 0.1 % Apply 1 application topically 2  (two) times daily.   glucose blood (ONETOUCH VERIO) test strip Use as instructed   Lancet Devices (ONE TOUCH DELICA LANCING DEV) MISC 1 Device by Does not apply route 2 (two) times daily.   lisinopril (ZESTRIL) 5 MG tablet Take 1 tablet (5 mg total) by mouth daily.   metFORMIN (GLUCOPHAGE-XR) 500 MG 24 hr tablet TAKE 1 TABLET BY MOUTH TWICE DAILY BEFORE A MEAL   mupirocin ointment (BACTROBAN) 2 % Apply 1 application topically daily.   nicotine polacrilex (COMMIT) 4 MG lozenge Take 4 mg by mouth as needed for smoking cessation.   ondansetron (ZOFRAN) 4 MG tablet Take 2 tablets (8 mg total) by mouth every 8 (eight) hours as needed for nausea or vomiting.   OneTouch Delica Lancets 33G MISC 1 Device by Does not apply route 2 (two) times daily.   traZODone (DESYREL) 50 MG tablet Take 0.5-1 tablets (25-50 mg total) by mouth at bedtime as needed for sleep.   TRELEGY ELLIPTA 100-62.5-25 MCG/INH AEPB INHALE 1 PUFF INTO THE LUNGS ONCE DAILY   No facility-administered encounter medications on file as of 08/05/2020.     Objective:   Goals Addressed              This Visit's Progress     RNCM-Pt "My blood pressures have been stable" (pt-stated)        Current Barriers:   Knowledge Deficits related to basic understanding of hypertension pathophysiology and self care management  Knowledge Deficits related to understanding of medications prescribed for management of hypertension  Non-adherence to prescribed medication regimen  Limited ability to follow a low sodium diet  Corporate treasurer.   Transportation barrier  Case Manager Clinical Goal(s):   Over the next 90 days, patient will demonstrate improved adherence to prescribed treatment plan for hypertension as evidenced by taking all medications as prescribed, monitoring and recording blood pressure as directed, adhering to low sodium/DASH diet  Over the next 90 days the patient will utilize the [employer] Benefit with  the ability to prepare heart healthy/low sodium diet in home setting  Over the next 30 days the patient will have follow up appointments with pcp and surgeon post right endarterectomy and hospitalization recently- completed  Interventions:   Evaluation of current treatment plan related to hypertension self management and patient's adherence to plan as established by provider. The patient states her headaches have resolved at this time. 08-05-2020: The patient gave several blood pressure readings. She has had a range of 111-134 systolic and 72 to 94 diastolic. She states she needs more paper to write down her readings. Will supply log record for her at her next appointment or mail her some copies of the log record. She is keeping a good record of her readings.   Discussed plans with patient for ongoing care management follow up and provided patient with direct contact information for care management team  Advised patient, providing education and rationale, to monitor blood pressure daily and record, the patient stated the pcp told her to go to the ED for SBP>199, see updated vital sign record for readings over the last several days  Advised patient to call [facility] for the "Healthy Foods Benefit" ($55.00) credit per month for buying food   Patient continues to check b/p BID and is following pharmacy recommendations to recheck after if it is high. The patient is keeping a log and can tell the RNCM her readings. 08-05-2020: See VS log for several readings provided by the patient today. Blood pressure this am was 120/78 with pulse of 75 Praised for taking and recording.  o BP Readings from Last 3 Encounters: o  08/05/20 o 120/78 o  07/20/20 o 125/84 o  06/29/20 o 118/81 o      Evaluated heart healthy diet with the patient. The patient verbalized she will be getting 15 meals a week that are low sodium and this switch will take place at the end of the week. 08-05-2020: The patient states they cut  her off on her meals. Something about "too many people coming to the door".  Education provided that the [name] would get the Care guides to check into this and also help her with other expressed needs.  Care guide referral for assistance and help with transportation needs related to transportation needs. They have helped her in the past with transportation. New referral placed today.   Evaluation of heart healthy/ADA diet. The patient states that she is doing well with her diet. Praised for positive changes in health and well being.            Patient Self Care Activities:   UNABLE to independently manage worsening headaches  Checks BP and records as discussed  Please see past updates related to this goal by clicking on the "Past Updates" button in the selected goal         RNCM: pt-"I feel like finding a new place to live will help me with my health" (pt-stated)        CARE PLAN ENTRY (see longtitudinal plan of care for additional care plan  information)  Current Barriers:   Chronic Disease Management support, education, and care coordination needs related to HLD, DMII, and Depression  Clinical Goal(s) related to HLD, DMII, and Depression:  Over the next 120 days, patient will:   Work with the care management team to address educational, disease management, and care coordination needs   Begin or continue self health monitoring activities as directed today  adhere to heart healthy/ADA diet  Call provider office for new or worsened signs and symptoms New or worsened symptom related to HLD/Depression and other chronic conditions  Call care management team with questions or concerns  Verbalize basic understanding of patient centered plan of care established today  Interventions related to HLD, DMII, and Depression:   Evaluation of current treatment plans and patient's adherence to plan as established by provider.  The patient states she is doing well but still needs a new place  to live. 08-05-2020: The patient is doing well and denies any new concerns. She has been keeping an accurate log of her VS and blood sugars. Praised for staying on top of her health and well being. Has a follow up with pcp on 08-26-2020.   Assessed patient understanding of disease states.  The patient feels she is doing very well at this time.  She states her blood sugars have been good also. The range has been 64 to 106 with a reading of 75 this am.  The patient praised for taking regularly and recording. She has been working hard to maintain her health and well being.   Assessed patient's education and care coordination needs. 05-27-2020: The patient is still looking for housing. The care guides have worked with her in the past. They have re-mailed her paperwork for housing in the area.  They have also sent information to the [employer] representative about getting her some glucerna. She had ask her granddaughter to take her OTC card to get her some and they would not take it at the pharmacy. Explained that she would need to use it at [facility] or order on line. The patient verbalized understanding. 08-05-2020: Care guide referral for help with transportation and food resources. She is no longer with [facility] but has [employer] coverage and has a card she can use for 75.00 to get food and other things.   Provided disease specific education to patient.  Education on smoking cessation and the benefit of smoking cessation on her overall health and well being. The patient states she has cut down considerably. Education and support.   Collaborated with appropriate clinical care team members regarding patient needs  Assessed housing situation. The patient feels she needs to find a new place to live.  She feels safe in her current environment but stays to herself. When talking to the LCSW last week she expressed wanting help with finding a new place to live. A care guide referral has been placed to help with finding new housing.  03-29-2020: The patient states she did not receive the housing information. Looked at the referral and the information was sent to her sons email address. The patient ask if the information could be sent to her mailing address. A new referral sent to the care guides to see if a copy of the resources could be mailed to her mailing address. She does not want another apartment.  Education and support given.   Patient Self Care Activities related to HLD, DMII, and Depression:   Patient is unable to independently self-manage chronic health conditions  Please  see past updates related to this goal by clicking on the "Past Updates" button in the selected goal          Plan:   Telephone follow up appointment with care management team member scheduled for: 09-30-2020 at 11:15 am   [place], MSN, [place] Mobile: [phone]

## 2020-08-05 NOTE — Telephone Encounter (Signed)
Kendra [name] 08/05/2020 Called pt regarding community resource referral received. Left message for pt to call me back, my info is [phone] please see ref notes for more details.  Kendra [name] Care Guide, Embedded Care Coordination , Care Management    

## 2020-08-09 ENCOUNTER — Other Ambulatory Visit: Payer: Self-pay | Admitting: Family Medicine

## 2020-08-09 DIAGNOSIS — Z8673 Personal history of transient ischemic attack (TIA), and cerebral infarction without residual deficits: Secondary | ICD-10-CM

## 2020-08-11 ENCOUNTER — Inpatient Hospital Stay (HOSPITAL_COMMUNITY): Admission: RE | Admit: 2020-08-11 | Payer: Medicare HMO | Source: Ambulatory Visit

## 2020-08-11 ENCOUNTER — Ambulatory Visit: Payer: Medicare HMO

## 2020-08-11 DIAGNOSIS — R6889 Other general symptoms and signs: Secondary | ICD-10-CM | POA: Diagnosis not present

## 2020-08-16 ENCOUNTER — Ambulatory Visit: Payer: Self-pay | Admitting: Pharmacist

## 2020-08-16 DIAGNOSIS — E118 Type 2 diabetes mellitus with unspecified complications: Secondary | ICD-10-CM

## 2020-08-16 DIAGNOSIS — E1159 Type 2 diabetes mellitus with other circulatory complications: Secondary | ICD-10-CM

## 2020-08-16 DIAGNOSIS — I152 Hypertension secondary to endocrine disorders: Secondary | ICD-10-CM

## 2020-08-16 DIAGNOSIS — Z794 Long term (current) use of insulin: Secondary | ICD-10-CM

## 2020-08-16 DIAGNOSIS — J432 Centrilobular emphysema: Secondary | ICD-10-CM

## 2020-08-16 NOTE — Patient Instructions (Signed)
Thank you allowing the Chronic Care Management Team to be a part of your care! It was a pleasure speaking with you today!     CCM (Chronic Care Management) Team    [name] RN, MSN, CCM Nurse Care Coordinator  [phone]   [name] PharmD  Clinical Pharmacist  [phone]   [name] LCSW Clinical Social Worker [phone]  Visit Information  Goals Addressed            This Visit's Progress   . PharmD-Medication Adherence       Current Barriers:  . Financial Barriers . Knowledge deficits related to coordination of her own care . Limited social support   Pharmacist Clinical Goal(s):   [name] Over the next 30 days, patient will demonstrate improved medication adherence as evidenced by verbalized understanding of prescribed medication regimen, assistance available, and patient report of adherence  Interventions: . Perform chart review . Follow up regarding blood pressure monitoring ? Reports taking lisinopril 5 mg once daily from pill pack ? Unable to locate BP log during our call, but reports has been monitoring ? Encourage patient to continue to monitor at home, keep log and bring record with her to upcoming appointment with PCP . Counsel on continued importance of blood sugar control  ? Reports taking metformin ER 500 mg twice daily from pill pack ? Unable to locate blood sugar log during our call, but reports has been monitoring occasionally ? Encourage patient to continue to monitor at home, keep log and bring record with her to upcoming appointment with PCP ? Will mail patient more blood sugar log pages as requested . Counsel on importance of medication adherence o Confirms continues to take medications as directed from pill packs as received from [place]  Again encourage patient to start using afternoon alarm on phone as additional adherence tool o Reports using maintenance Trelegy inhaler daily (and rinsing mouth out after each use) and rescue  (albuterol) as needed as directed.  . Congratulate patient as she reports having quit smoking as planned on Thanksgiving. Reports has not smoked in the past 5 days. o Encourage patient to continue with cessation . Coorination of care: follow up regarding missed appointment with Hematology.  o Note patient kept appointment with [place] for 11/2, but missed appointment on 11/15 for follow up/Vitamin B12 injection. Reports missed appointment due to transportation not coming. o Reports was late for Carotid scan appointment on 11/24, but rescheduled for 12/16 o Encourage patient to call [place] today to reschedule missed appointment o Encourage patient to then call to schedule transportation for upcoming appointments including with [place], imaging and PCP  Reports will call [employer] to schedule trasportation. Confirm patient also has phone numbers for other transportation resources, including [employer] and [employer], as provided by Care Guide  Patient Self Care Activities:  . Patient takes medications as directed with aid of adherence tools. o Using pill packaging from [facility] . Calls pharmacy for medication refills . Patient to attend scheduled medical appointments . Calls provider office for new concerns or questions  . Patient to check blood sugars and keep log . Patient to check blood pressure daily and keep log  Please see past updates related to this goal by clicking on the "Past Updates" button in the selected goal          The patient verbalized understanding of instructions, educational materials, and care plan provided today and declined offer to receive copy of patient  instructions, educational materials, and care plan.   Telephone follow up appointment with care management team member scheduled for: 12/27 at 9:30 am  [name], PharmD, [place] [phone]

## 2020-08-16 NOTE — Chronic Care Management (AMB) (Signed)
Chronic Care Management   Follow Up Note   08/16/2020 Name: [name] [name] MRN: [id number] DOB: 1946-06-25  Referred by: Kendra Bangs, FNP Reason for referral : Chronic Care Management (Patient Phone Call)   [name] [name] is a 74 y.o. year old female who is a primary care patient of [name] [name], [place]. The CCM team was consulted for assistance with chronic disease management and care coordination needs.    I reached out to [name] by phone today.   Review of patient status, including review of consultants reports, relevant laboratory and other test results, and collaboration with appropriate care team members and the patient's provider was performed as part of comprehensive patient evaluation and provision of chronic care management services.    SDOH (Social Determinants of Health) assessments performed: No See Care Plan activities for detailed interventions related to [name] [name] [name] [name])     Outpatient Encounter Medications as of 08/16/2020  Medication Sig  . albuterol (VENTOLIN HFA) 108 (90 Base) MCG/ACT inhaler INHALE 1-2 PUFFS EVERY 6 HOURS AS NEEDEDSHORTNESS OF BREATH AND WHEEZING  . aspirin 325 MG tablet Take 325 mg by mouth daily.  [name] atorvastatin (LIPITOR) 40 MG tablet TAKE 1 TABLET BY MOUTH ONCE DAILY  . Blood Glucose Monitoring Suppl (ONETOUCH VERIO) w/Device KIT USE AS DIRECTED  . buPROPion (WELLBUTRIN XL) 150 MG 24 hr tablet TAKE 1 TABLET BY MOUTH ONCE DAILY  . Cholecalciferol (VITAMIN D3) 50 MCG (2000 UT) capsule Take 1 capsule (2,000 Units total) by mouth daily.  . clopidogrel (PLAVIX) 75 MG tablet TAKE 1 TABLET BY MOUTH ONCE DAILY  . cyclobenzaprine (FLEXERIL) 5 MG tablet Take 0.5-1 tablets (2.5-5 mg total) by mouth 3 (three) times daily as needed for muscle spasms.  . diclofenac Sodium (VOLTAREN) 1 % GEL APPLY 2 GRAMS TOPICALLY 4 TIMES DAILY (Patient not taking: Reported on 06/29/2020)  . gentamicin cream (GARAMYCIN) 0.1 % Apply 1 application topically 2 (two)  times daily.  [name] glucose blood (ONETOUCH VERIO) test strip Use as instructed  . Lancet Devices (ONE TOUCH DELICA LANCING DEV) MISC 1 Device by Does not apply route 2 (two) times daily.  [name] lisinopril (ZESTRIL) 5 MG tablet Take 1 tablet (5 mg total) by mouth daily.  . metFORMIN (GLUCOPHAGE-XR) 500 MG 24 hr tablet TAKE 1 TABLET BY MOUTH TWICE DAILY BEFORE A MEAL  . mupirocin ointment (BACTROBAN) 2 % Apply 1 application topically daily.  . nicotine polacrilex (COMMIT) 4 MG lozenge Take 4 mg by mouth as needed for smoking cessation.  . ondansetron (ZOFRAN) 4 MG tablet Take 2 tablets (8 mg total) by mouth every 8 (eight) hours as needed for nausea or vomiting.  [name] [name] MISC 1 Device by Does not apply route 2 (two) times daily.  . traZODone (DESYREL) 50 MG tablet Take 0.5-1 tablets (25-50 mg total) by mouth at bedtime as needed for sleep.  . TRELEGY ELLIPTA 100-62.5-25 MCG/INH AEPB INHALE 1 PUFF INTO THE LUNGS ONCE DAILY   No facility-administered encounter medications on file as of 08/16/2020.    Goals Addressed            This Visit's Progress   . PharmD-Medication Adherence       Current Barriers:  . Financial Barriers . Knowledge deficits related to coordination of her own care . Limited social support   Pharmacist Clinical Goal(s):   [name] Over the next 30 days, patient will demonstrate improved medication adherence as evidenced by verbalized understanding of prescribed medication  regimen, assistance available, and patient report of adherence  Interventions: . Perform chart review . Follow up regarding blood pressure monitoring ? Reports taking lisinopril 5 mg once daily from pill pack ? Unable to locate BP log during our call, but reports has been monitoring ? Encourage patient to continue to monitor at home, keep log and bring record with her to upcoming appointment with PCP . Counsel on continued importance of blood sugar control  ? Reports taking metformin ER  500 mg twice daily from pill pack ? Unable to locate blood sugar log during our call, but reports has been monitoring occasionally ? Encourage patient to continue to monitor at home, keep log and bring record with her to upcoming appointment with PCP ? Will mail patient more blood sugar log pages as requested . Counsel on importance of medication adherence o Confirms continues to take medications as directed from pill packs as received from [place]  Again encourage patient to start using afternoon alarm on phone as additional adherence tool o Reports using maintenance Trelegy inhaler daily (and rinsing mouth out after each use) and rescue (albuterol) as needed as directed.  . Congratulate patient as she reports having quit smoking as planned on Thanksgiving. Reports has not smoked in the past 5 days. o Encourage patient to continue with cessation . Coorination of care: follow up regarding missed appointment with Hematology.  o Note patient kept appointment with [facility] for 11/2, but missed appointment on 11/15 for follow up/Vitamin B12 injection. Reports missed appointment due to transportation not coming. o Reports was late for Carotid scan appointment on 11/24, but rescheduled for 12/16 o Encourage patient to call [facility] today to reschedule missed appointment o Encourage patient to then call to schedule transportation for upcoming appointments including with [facility], imaging and PCP  Reports will call [employer] to schedule trasportation. Confirm patient also has phone numbers for other transportation resources, including [employer] and [employer], as provided by Care Guide  Patient Self Care Activities:  . Patient takes medications as directed with aid of adherence tools. o Using pill packaging from [facility] . Calls pharmacy for medication refills . Patient to attend scheduled medical appointments . Calls provider office for new concerns or questions  . Patient to check blood  sugars and keep log . Patient to check blood pressure daily and keep log  Please see past updates related to this goal by clicking on the "Past Updates" button in the selected goal          Plan  Telephone follow up appointment with care management team member scheduled for: 12/27 at 9:30 am  [name], PharmD, [place] [phone]

## 2020-08-20 ENCOUNTER — Encounter: Payer: Self-pay | Admitting: Oncology

## 2020-08-20 ENCOUNTER — Other Ambulatory Visit: Payer: Self-pay

## 2020-08-20 ENCOUNTER — Inpatient Hospital Stay: Payer: Medicare HMO | Attending: Oncology | Admitting: Oncology

## 2020-08-20 ENCOUNTER — Inpatient Hospital Stay: Payer: Medicare HMO

## 2020-08-20 VITALS — BP 125/80 | HR 87 | Temp 97.2°F | Wt 123.5 lb

## 2020-08-20 DIAGNOSIS — Z87891 Personal history of nicotine dependence: Secondary | ICD-10-CM | POA: Diagnosis not present

## 2020-08-20 DIAGNOSIS — D649 Anemia, unspecified: Secondary | ICD-10-CM | POA: Diagnosis not present

## 2020-08-20 DIAGNOSIS — E538 Deficiency of other specified B group vitamins: Secondary | ICD-10-CM | POA: Insufficient documentation

## 2020-08-20 DIAGNOSIS — Z79899 Other long term (current) drug therapy: Secondary | ICD-10-CM | POA: Insufficient documentation

## 2020-08-20 DIAGNOSIS — R6889 Other general symptoms and signs: Secondary | ICD-10-CM | POA: Diagnosis not present

## 2020-08-20 DIAGNOSIS — M069 Rheumatoid arthritis, unspecified: Secondary | ICD-10-CM | POA: Diagnosis not present

## 2020-08-20 DIAGNOSIS — F17210 Nicotine dependence, cigarettes, uncomplicated: Secondary | ICD-10-CM | POA: Insufficient documentation

## 2020-08-20 DIAGNOSIS — Z720 Tobacco use: Secondary | ICD-10-CM | POA: Insufficient documentation

## 2020-08-20 MED ORDER — CYANOCOBALAMIN 1000 MCG/ML IJ SOLN
1000.0000 ug | Freq: Once | INTRAMUSCULAR | Status: AC
  Administered 2020-08-20: 1000 ug via INTRAMUSCULAR
  Filled 2020-08-20: qty 1

## 2020-08-20 NOTE — Progress Notes (Signed)
Patient reports new nausea and stable chronic SOBr.

## 2020-08-20 NOTE — Progress Notes (Signed)
Hematology/Oncology Consult note [name] [phone] [phone]   Patient Care Team: [name], FNP as PCP - General (Family Medicine) Steele Sizer, MD as Attending Physician (Family Medicine) Christene Lye, MD (General Surgery) [place], Virl Diamond, [facility] as Pharmacist Greg Cutter, LCSW as Social Worker (Licensed Clinical Social Worker) Vanita Ingles, RN as Case Manager ([place]) Malfi, [name], FNP as Nurse Practitioner (Family Medicine)  REFERRING PROVIDER: [name] [name], FNP  CHIEF COMPLAINTS/REASON FOR VISIT:  Evaluation of anemia  HISTORY OF PRESENTING ILLNESS:  Kendra [name] is a  74 y.o.  female with PMH listed below who was referred to me for evaluation of anemia Reviewed patient's recent labs that was done.  06/14/20 Labs revealed anemia with hemoglobin of 11.2  Reviewed patient's previous labs ordered by primary care physician's office, anemia is chronic onset , duration is since Jan 2021 No aggravating or improving factors.  Associated signs and symptoms: Patient reports fatigue. deneis  SOB with exertion.  Denies  easy bruising, hematochezia, hemoptysis, hematuria. She has had lost weight over the past 2 years. Context: History of GI bleeding: denies               History of Chronic kidney disease: deneis               History of autoimmune disease: Rheumatoid arthritis               Last colonoscopy: 2007                Extensive smoking history   INTERVAL HISTORY Kendra [name] is a 74 y.o. female who has above history reviewed by me today presents for follow up visit for management of anemia Problems and complaints are listed below: He had blood work done during hte interval and presents to discuss results and management plans.  Review of Systems  Constitutional: Positive for fatigue. Negative for appetite change, chills and fever.  HENT:   Negative for hearing loss and  voice change.   Eyes: Negative for eye problems.  Respiratory: Negative for chest tightness, cough and wheezing.   Cardiovascular: Negative for chest pain.  Gastrointestinal: Negative for abdominal distention, abdominal pain and blood in stool.  Endocrine: Negative for hot flashes.  Genitourinary: Negative for difficulty urinating and frequency.   Musculoskeletal: Positive for arthralgias.  Skin: Negative for itching and rash.  Neurological: Negative for extremity weakness.  Hematological: Negative for adenopathy.  Psychiatric/Behavioral: Negative for confusion.     MEDICAL HISTORY:  Past Medical History:  Diagnosis Date  . Asthma   . Back pain   . Cataract   . Cervical disc disorder   . COPD (chronic obstructive pulmonary disease) ([place])   . Depression   . Diabetes ([place])    type II  . Diabetic neuropathy ([place])   . Dyspnea    with exertion  . GERD (gastroesophageal reflux disease)   . History of kidney stones    per patient "can't remember the year"  . Hyperlipidemia   . Hypertension    per patient "take meds for it"  . Insomnia   . Neuropathy   . Osteoarthritis   . Osteopenia   . Pneumonia 2018   per patient  . Reflux   . Rheumatoid arthritis ([place])   . Stroke ([place]) 2015   and again 2017  - Weakness in left leg, staggers w/ walking, vision   . Tenosynovitis     SURGICAL  HISTORY: Past Surgical History:  Procedure Laterality Date  . BREAST BIOPSY Right    Fatty Tumor  . ENDARTERECTOMY Right 10/21/2019   Procedure: ENDARTERECTOMY CAROTID RIGHT;  Surgeon: [name], MD;  Location: [facility];  Service: Vascular;  Laterality: Right;  . ENDARTERECTOMY Left 12/30/2019   Procedure: LEFT CAROTID ENDARTERECTOMY;  Surgeon: [name], MD;  Location: [facility];  Service: Vascular;  Laterality: Left;  . NECK SURGERY    . OVARIAN CYST REMOVAL    . PATCH ANGIOPLASTY Right 10/21/2019   Procedure: Patch Angioplasty Using XenoSure Biologic Patch Right  Carotid;  Surgeon: [name], MD;  Location: [facility];  Service: Vascular;  Laterality: Right;  . PATCH ANGIOPLASTY Left 12/30/2019   Procedure: Patch Angioplasty Left Carotid;  Surgeon: [name], MD;  Location: [facility];  Service: Vascular;  Laterality: Left;    SOCIAL HISTORY: Social History   Socioeconomic History  . Marital status: Widowed    Spouse name: Not on file  . Number of children: Not on file  . Years of education: Not on file  . Highest education level: Not on file  Occupational History  . Not on file  Tobacco Use  . Smoking status: Current Some Day Smoker    Years: 50.00    Types: Cigarettes  . Smokeless tobacco: Former Systems developer    Types: Snuff  . Tobacco comment: .5 of 1 cigarette a day  Vaping Use  . Vaping Use: Never used  Substance and Sexual Activity  . Alcohol use: No  . Drug use: No  . Sexual activity: Never  Other Topics Concern  . Not on file  Social History Narrative  . Not on file   Social Determinants of Health   Financial Resource Strain:   . Difficulty of Paying Living Expenses: Not on file  Food Insecurity:   . Worried About Charity fundraiser in the Last Year: Not on file  . Ran Out of Food in the Last Year: Not on file  Transportation Needs:   . Lack of Transportation (Medical): Not on file  . Lack of Transportation (Non-Medical): Not on file  Physical Activity:   . Days of Exercise per Week: Not on file  . Minutes of Exercise per Session: Not on file  Stress:   . Feeling of Stress : Not on file  Social Connections:   . Frequency of Communication with Friends and Family: Not on file  . Frequency of Social Gatherings with Friends and Family: Not on file  . Attends Religious Services: Not on file  . Active Member of Clubs or Organizations: Not on file  . Attends Archivist Meetings: Not on file  . Marital Status: Not on file  Intimate Partner Violence:   . Fear of Current or Ex-Partner: Not on file   . Emotionally Abused: Not on file  . Physically Abused: Not on file  . Sexually Abused: Not on file    FAMILY HISTORY: Family History  Problem Relation Age of Onset  . Stroke Father   . Depression Father   . Diabetes Mother   . Hyperlipidemia Mother   . Breast cancer Mother   . Heart murmur Son   . Heart murmur Son   . Heart murmur Son     ALLERGIES:  is allergic to citalopram.  MEDICATIONS:  Current Outpatient Medications  Medication Sig Dispense Refill  . albuterol (VENTOLIN HFA) 108 (90 Base) MCG/ACT inhaler INHALE 1-2 PUFFS EVERY 6 HOURS  AS NEEDEDSHORTNESS OF BREATH AND WHEEZING 8.5 g 1  . aspirin 325 MG tablet Take 325 mg by mouth daily.    [name] atorvastatin (LIPITOR) 40 MG tablet TAKE 1 TABLET BY MOUTH ONCE DAILY 90 tablet 0  . Blood Glucose Monitoring Suppl (ONETOUCH VERIO) w/Device KIT USE AS DIRECTED 1 kit 0  . buPROPion (WELLBUTRIN XL) 150 MG 24 hr tablet TAKE 1 TABLET BY MOUTH ONCE DAILY 90 tablet 0  . Cholecalciferol (VITAMIN D3) 50 MCG (2000 UT) capsule Take 1 capsule (2,000 Units total) by mouth daily. 90 capsule 1  . clopidogrel (PLAVIX) 75 MG tablet TAKE 1 TABLET BY MOUTH ONCE DAILY 90 tablet 1  . gentamicin cream (GARAMYCIN) 0.1 % Apply 1 application topically 2 (two) times daily. 30 g 1  . glucose blood (ONETOUCH VERIO) test strip Use as instructed 200 each 4  . Lancet Devices (ONE TOUCH DELICA LANCING DEV) MISC 1 Device by Does not apply route 2 (two) times daily. 200 each 4  . lisinopril (ZESTRIL) 5 MG tablet Take 1 tablet (5 mg total) by mouth daily. 90 tablet 3  . metFORMIN (GLUCOPHAGE-XR) 500 MG 24 hr tablet TAKE 1 TABLET BY MOUTH TWICE DAILY BEFORE A MEAL 60 tablet 3  . mupirocin ointment (BACTROBAN) 2 % Apply 1 application topically daily. 22 g 0  . nicotine polacrilex (COMMIT) 4 MG lozenge Take 4 mg by mouth as needed for smoking cessation.    [name] Lancets 41Y MISC 1 Device by Does not apply route 2 (two) times daily. 200 each 4  . traZODone  (DESYREL) 50 MG tablet Take 0.5-1 tablets (25-50 mg total) by mouth at bedtime as needed for sleep. 30 tablet 1  . TRELEGY ELLIPTA 100-62.5-25 MCG/INH AEPB INHALE 1 PUFF INTO THE LUNGS ONCE DAILY 60 each 1  . cyclobenzaprine (FLEXERIL) 5 MG tablet Take 0.5-1 tablets (2.5-5 mg total) by mouth 3 (three) times daily as needed for muscle spasms. 30 tablet 1  . diclofenac Sodium (VOLTAREN) 1 % GEL APPLY 2 GRAMS TOPICALLY 4 TIMES DAILY (Patient not taking: Reported on 06/29/2020) 100 g 0  . ondansetron (ZOFRAN) 4 MG tablet Take 2 tablets (8 mg total) by mouth every 8 (eight) hours as needed for nausea or vomiting. 20 tablet 0   No current facility-administered medications for this visit.     PHYSICAL EXAMINATION: ECOG PERFORMANCE STATUS: 1 - Symptomatic but completely ambulatory Vitals:   08/20/20 0905  BP: 125/80  Pulse: 87  Temp: (!) 97.2 F (36.2 C)  SpO2: 97%   Filed Weights   08/20/20 0905  Weight: 123 lb 8 oz (56 kg)    Physical Exam Constitutional:      General: She is not in acute distress.    Comments: Thin built  HENT:     Head: Normocephalic and atraumatic.  Eyes:     General: No scleral icterus. Cardiovascular:     Rate and Rhythm: Normal rate and regular rhythm.     Heart sounds: Normal heart sounds.  Pulmonary:     Effort: Pulmonary effort is normal. No respiratory distress.     Breath sounds: No wheezing.  Abdominal:     General: Bowel sounds are normal. There is no distension.     Palpations: Abdomen is soft.  Musculoskeletal:        General: No deformity. Normal range of motion.     Cervical back: Normal range of motion and neck supple.  Skin:    General: Skin is warm  and dry.     Findings: No erythema or rash.  Neurological:     Mental Status: She is alert. Mental status is at baseline.     Cranial Nerves: No cranial nerve deficit.     Coordination: Coordination normal.  Psychiatric:        Mood and Affect: Mood normal.      LABORATORY DATA:  I  have reviewed the data as listed Lab Results  Component Value Date   WBC 4.6 07/20/2020   HGB 11.4 (L) 07/20/2020   HCT 34.4 (L) 07/20/2020   MCV 93.7 07/20/2020   PLT 238 07/20/2020   Recent Labs    10/16/19 1717 10/21/19 0517 12/30/19 1116 12/30/19 1116 12/31/19 0450 01/15/20 1324 02/06/20 0735 06/14/20 0929 07/20/20 1018  NA 141   < > 142   < >   < > 137 141 141 139  K 3.7   < > 4.3   < >   < > 3.9 4.7 4.1 3.8  CL 106   < > 106   < >   < > 105 105 105 102  CO2 25   < > 28   < >   < > 25 32 27 28  GLUCOSE 102*   < > 107*   < >   < > 108* 110* 114* 118*  BUN 14   < > 9   < >   < > _0 CREATININE 0.78   < > 0.76  --    < > 0.80 0.72 0.83 0.66  CALCIUM 9.6   < > 10.0   < >   < > 9.5 10.1 9.5 9.5  GFRNONAA >60   < > >60  --    < > >60 84 70 >60  GFRAA >60   < > >60  --    < > >60 98 82  --   PROT 7.2  --  7.4   < >  --   --  6.5 6.6 7.5  ALBUMIN 4.0  --  4.2  --   --   --   --   --  4.4  AST 28  --  21   < >  --   --  _1 ALT 26  --  23   < >  --   --  _2 ALKPHOS 65  --  73  --   --   --   --   --  67  BILITOT 0.6  --  0.6   < >  --   --  0.3 0.5 0.4   < > = values in this interval not displayed.   Iron/TIBC/Ferritin/ %Sat    Component Value Date/Time   IRON 65 07/20/2020 1018   TIBC 395 07/20/2020 1018   FERRITIN 33 07/20/2020 1018   IRONPCTSAT 17 07/20/2020 1018        ASSESSMENT & PLAN:  1. Normocytic anemia   2. Personal history of tobacco use, presenting hazards to health   3. Vitamin B12 deficiency     Anemia: Hemoglobin 11.4, Labs reviewed and discussed with patient. Multiple myeloma showed no M protein.  Light chain ratio was increased to 4.74.  Patient has normal kidney function. I will check random urine protein electrophoresis. Vitamin B12 deficiency, B12 level was at 173.  Recommend patient to start vitamin B12 injection daily for 5 days, followed by weekly x4 followed by  monthly.  # extensive smoking history: smoking  cessation was discussed.  I have referred her to lung cancer screening program.  Orders Placed This Encounter  Procedures  . Protein Electro, Random Urine    Standing Status:   Future    Number of Occurrences:   1    Standing Expiration Date:   08/20/2021  . CBC with Differential/Platelet    Standing Status:   Future    Standing Expiration Date:   08/20/2021  . Vitamin B12    Standing Status:   Future    Standing Expiration Date:   08/20/2021    All questions were answered. The patient knows to call the clinic with any problems questions or concerns. Cc. Malfi, [name], FNP  Return of visit: 3 months Thank you for this kind referral and the opportunity to participate in the care of this patient. A copy of today's note is routed to referring provider    Earlie Server, MD, PhD 08/20/2020

## 2020-08-23 ENCOUNTER — Inpatient Hospital Stay: Payer: Medicare HMO

## 2020-08-23 LAB — PROTEIN ELECTRO, RANDOM URINE
Albumin ELP, Urine: 29 %
Alpha-1-Globulin, U: 4.1 %
Alpha-2-Globulin, U: 15 %
Beta Globulin, U: 16.1 %
Gamma Globulin, U: 35.8 %
M Component, Ur: 12.2 % — ABNORMAL HIGH
Total Protein, Urine: 14.7 mg/dL

## 2020-08-24 ENCOUNTER — Telehealth: Payer: Self-pay

## 2020-08-24 ENCOUNTER — Ambulatory Visit: Payer: Medicare HMO | Admitting: Licensed Clinical Social Worker

## 2020-08-24 ENCOUNTER — Inpatient Hospital Stay: Payer: Medicare HMO

## 2020-08-24 DIAGNOSIS — E1159 Type 2 diabetes mellitus with other circulatory complications: Secondary | ICD-10-CM

## 2020-08-24 DIAGNOSIS — I152 Hypertension secondary to endocrine disorders: Secondary | ICD-10-CM

## 2020-08-24 DIAGNOSIS — Z599 Problem related to housing and economic circumstances, unspecified: Secondary | ICD-10-CM

## 2020-08-24 DIAGNOSIS — Z794 Long term (current) use of insulin: Secondary | ICD-10-CM

## 2020-08-24 DIAGNOSIS — Z59819 Housing instability, housed unspecified: Secondary | ICD-10-CM

## 2020-08-24 DIAGNOSIS — F324 Major depressive disorder, single episode, in partial remission: Secondary | ICD-10-CM

## 2020-08-24 DIAGNOSIS — E118 Type 2 diabetes mellitus with unspecified complications: Secondary | ICD-10-CM

## 2020-08-24 NOTE — Telephone Encounter (Signed)
Pt called back and states e-mail is wrong use [email or web address]

## 2020-08-24 NOTE — Chronic Care Management (AMB) (Signed)
Chronic Care Management    Clinical Social Work Follow Up Note  08/24/2020 Name: Kendra [name] MRN: [id number] DOB: 06/08/1946  Kendra [name] is a 74 y.o. year old female who is a primary care patient of [name], FNP. The CCM team was consulted for assistance with [employer]  and [facility] [employer].   Review of patient status, including review of consultants reports, other relevant assessments, and collaboration with appropriate care team members and the patient's provider was performed as part of comprehensive patient evaluation and provision of chronic care management services.    SDOH (Social Determinants of Health) assessments performed: Yes    Outpatient Encounter Medications as of 08/24/2020  Medication Sig  . albuterol (VENTOLIN HFA) 108 (90 Base) MCG/ACT inhaler INHALE 1-2 PUFFS EVERY 6 HOURS AS NEEDEDSHORTNESS OF BREATH AND WHEEZING  . aspirin 325 MG tablet Take 325 mg by mouth daily.  [name] atorvastatin (LIPITOR) 40 MG tablet TAKE 1 TABLET BY MOUTH ONCE DAILY  . Blood Glucose Monitoring Suppl (ONETOUCH VERIO) w/Device KIT USE AS DIRECTED  . buPROPion (WELLBUTRIN XL) 150 MG 24 hr tablet TAKE 1 TABLET BY MOUTH ONCE DAILY  . Cholecalciferol (VITAMIN D3) 50 MCG (2000 UT) capsule Take 1 capsule (2,000 Units total) by mouth daily.  . clopidogrel (PLAVIX) 75 MG tablet TAKE 1 TABLET BY MOUTH ONCE DAILY  . cyclobenzaprine (FLEXERIL) 5 MG tablet Take 0.5-1 tablets (2.5-5 mg total) by mouth 3 (three) times daily as needed for muscle spasms.  . diclofenac Sodium (VOLTAREN) 1 % GEL APPLY 2 GRAMS TOPICALLY 4 TIMES DAILY (Patient not taking: Reported on 06/29/2020)  . gentamicin cream (GARAMYCIN) 0.1 % Apply 1 application topically 2 (two) times daily.  [name] glucose blood (ONETOUCH VERIO) test strip Use as instructed  . Lancet Devices (ONE TOUCH DELICA LANCING DEV) MISC 1 Device by Does not apply route 2 (two) times daily.  [name] lisinopril (ZESTRIL) 5 MG tablet  Take 1 tablet (5 mg total) by mouth daily.  . metFORMIN (GLUCOPHAGE-XR) 500 MG 24 hr tablet TAKE 1 TABLET BY MOUTH TWICE DAILY BEFORE A MEAL  . mupirocin ointment (BACTROBAN) 2 % Apply 1 application topically daily.  . nicotine polacrilex (COMMIT) 4 MG lozenge Take 4 mg by mouth as needed for smoking cessation.  . ondansetron (ZOFRAN) 4 MG tablet Take 2 tablets (8 mg total) by mouth every 8 (eight) hours as needed for nausea or vomiting.  [name] Lancets 29F MISC 1 Device by Does not apply route 2 (two) times daily.  . traZODone (DESYREL) 50 MG tablet Take 0.5-1 tablets (25-50 mg total) by mouth at bedtime as needed for sleep.  . TRELEGY ELLIPTA 100-62.5-25 MCG/INH AEPB INHALE 1 PUFF INTO THE LUNGS ONCE DAILY   No facility-administered encounter medications on file as of 08/24/2020.     Patient Care Plan: General Social Work (Adult)    Problem Identified: Coping Skills (General Plan of Care)     Goal: Coping Skills Enhanced   Start Date: 08/24/2020  Note:   Evidence-based guidance:   Acknowledge, normalize and validate difficulty of making life-long lifestyle changes.   Identify current effective and ineffective coping strategies.   Encourage patient and caregiver participation in care to increase self-esteem, confidence and feelings of control.   Consider alternative and complementary therapy approaches such as meditation, mindfulness or yoga.   Encourage participation in cognitive behavioral therapy to foster a positive identity, increase self-awareness, as well as bolster self-esteem, confidence and self-efficacy.  Discuss spirituality; be present as concerns are identified; encourage journaling, prayer, worship services, meditation or pastoral counseling.   Encourage participation in pleasurable group activities such as hobbies, singing, sports or volunteering).   Encourage the use of mindfulness; refer for training or intensive intervention.   Consider the use of  meditative movement therapy such as tai chi, yoga or qigong.   Promote a regular daily exercise program based on tolerance, ability and patient choice to support positive thinking about disease or aging.   Notes:   Current Barriers:  . Financial constraints . Limited social support . ADL IADL limitations . Mental Health Concerns  . Social Isolation . Limited access to caregiver . Inability to perform ADL's independently . Inability to perform IADL's independently . Lacks knowledge of community resource: grief support resources within the area  Interventions: . Patient interviewed and appropriate assessments performed . Education provided on how to implement appropriate self-care and coping tools into her daily routine to combat negative thinking and depressive episodes. . Patient reports that her feet continue to swell even though she elevates them on a pillow regularly. Patient reports that her right toe is red and irritated. Patient shares that she put some pain relief ointment on it last night which helped. . Patient is experiencing increase stress at this time because she has to relocate out of her daughter in law's house. She provided a move out date to [facility] RNCM but patient reports that her daughter in law will hopefully allow her to stay at residence until she secures another source of housing. Patient has been active about researching low income apartments but is having trouble finding anything within her budget. Patient also does not have a source of stable transportation to go look at potential housing options. C3 referral placed on 08/24/20 for low income housing.  . Provided mental health counseling with regard to current stressors. LCSW used active and reflective listening and implemented appropriate interventions to help suppport patient and her emotional needs. Advised patient to implement deep breathing/grounding/meditation/self-care exercises into her daily routine to combat  stressors. Education provided.  [name] LCSW was informed that patient's sleep hygiene and routine is the same. She reports ongoing difficulty with staying asleep at night which affects her energy, mobility and motivation. Patient reports interest in gaining medication assistance for this concern. Patient ask for [facility] office number so that she can set up appointment with PCP to discuss. LCSW provided education on healthy sleep hygiene and what that looks like. LCSW encouraged patient to implement a night time routine into her schedule that works best for her and that she is able to maintain. Advised patient to implement deep breathing/grounding/meditation/self-care exercises into her nightly routine to combat racing thoughts at night.  [name] LCSW reviewed upcoming appointments with patient. She is agreeable to contact [employer] to set up transportation arrangements for future appointments.  . Provided patient with information about grief support resources within her area. Patient denies needing this support at this time but was appreciative of education that was provided.  . Discussed plans with patient for ongoing care management follow up and provided patient with direct contact information for care management team . Patient reports that she has been managing her blood pressure very well and is proud of this accomplishment. LCSW provided education on healthy self-care and provided positive reinforcement for her taking action to improve her overall health and symptoms.  . Assisted patient/caregiver with obtaining information about health plan benefits . LCSW used active and reflective  listening and implemented appropriate interventions to help suppport patient and her emotional needs.  Patient Self Care Activities:  . Attends all scheduled provider appointments . Calls provider office for new concerns or questions  Please see past updates related to this goal by clicking on the "Past Updates" button in the selected  goal      Task: Support Psychosocial Response to Risk or Actual Health Condition   Note:   Care Management Activities:    - active listening utilized - caregiver stress acknowledged - counseling provided - current coping strategies identified - decision-making supported - healthy lifestyle promoted - journaling promoted - meditative movement therapy encouraged - mindfulness encouraged - participation in counseling encouraged - problem-solving facilitated - relaxation techniques promoted - self-reflection promoted - spiritual activities promoted - verbalization of feelings encouraged    Notes:     10 LITTLE Things To Do When You're Feeling Too Down To Do Anything  Take a shower. Even if you plan to stay in all day long and not see a soul, take a shower. It takes the most effort to hop in to the shower but once you do, you'll feel immediate results. It will wake you up and you'll be feeling much fresher (and cleaner too).  Brush and floss your teeth. Give your teeth a good brushing with a floss finish. It's a small task but it feels so good and you can check 'taking care of your health' off the list of things to do.  Do something small on your list. Most of [place] have some small thing we would like to get done (load of laundry, sew a button, email a friend). Doing one of these things will make you feel like you've accomplished something.  Drink water. Drinking water is easy right? It's also really beneficial for your health so keep a glass beside you all day and take sips often. It gives you energy and prevents you from boredom eating.  Do some floor exercises. The last thing you want to do is exercise but it might be just the thing you need the most. Keep it simple and do exercises that involve sitting or laying on the floor. Even the smallest of exercises release chemicals in the brain that make you feel good. Yoga stretches or core exercises are going to make you feel good  with minimal effort.  Make your bed. Making your bed takes a few minutes but it's productive and you'll feel relieved when it's done. An unmade bed is a huge visual reminder that you're having an unproductive day. Do it and consider it your housework for the day.  Put on some nice clothes. Take the sweatpants off even if you don't plan to go anywhere. Put on clothes that make you feel good. Take a look in the mirror so your brain recognizes the sweatpants have been replaced with clothes that make you look great. It's an instant confidence booster.  Wash the dishes. A pile of dirty dishes in the sink is a reflection of your mood. It's possible that if you wash up the dishes, your mood will follow suit. It's worth a try.  Cook a real meal. If you have the luxury to have a "do nothing" day, you have time to make a real meal for yourself. Make a meal that you love to eat. The process is good to get you out of the funk and the food will ensure you have more energy for tomorrow.  Write out your thoughts  by hand. When you hand write, you stimulate your brain to focus on the moment that you're in so make yourself comfortable and write whatever comes into your mind. Put those thoughts out on paper so they stop spinning around in your head. Those thoughts might be the very thing holding you down.  Follow Up Plan: SW will follow up with patient by phone over the next quarter  [name], [place], MSW, [email or web address] Phone: [phone]

## 2020-08-24 NOTE — Telephone Encounter (Signed)
    MA12/03/2020 1st Attempt  Name: Kendra [name]   MRN: [id number]   DOB: 1946-07-27   AGE: 74 y.o.   GENDER: female   PCP [name], FNP.   08/24/20 Spoke with patient she requested I email housing resources to [email or web address].  I will call the patient again in the next 7 days.    Madgeline Rayo, AAS Paralegal, [place] . Embedded Care Coordination [facility] Health  Care Management  [address], [place] [address] [name]  [phone]   [email or web address]

## 2020-08-25 ENCOUNTER — Inpatient Hospital Stay: Payer: Medicare HMO

## 2020-08-26 ENCOUNTER — Inpatient Hospital Stay: Payer: Medicare HMO

## 2020-08-26 ENCOUNTER — Ambulatory Visit: Payer: Medicare Other | Admitting: Family Medicine

## 2020-08-30 ENCOUNTER — Inpatient Hospital Stay: Payer: Medicare HMO

## 2020-08-30 ENCOUNTER — Other Ambulatory Visit: Payer: Self-pay | Admitting: Oncology

## 2020-08-30 DIAGNOSIS — R803 Bence Jones proteinuria: Secondary | ICD-10-CM

## 2020-08-31 ENCOUNTER — Telehealth: Payer: Self-pay

## 2020-08-31 NOTE — Telephone Encounter (Signed)
Error

## 2020-08-31 NOTE — Telephone Encounter (Signed)
-----   Message from Earlie Server, MD sent at 08/30/2020  9:35 PM EST ----- Please advise patient to do additional labs.  I ordered the IFE/PE, random urine.  No need to do any blood testing.  Please let the lab know so that they were not pull lab results  thank you.

## 2020-08-31 NOTE — Telephone Encounter (Signed)
Pt notified. She states that she needs a 3 day notice for transportation. Since she is coming on 12/20 for injection, she will give urine specimen then.

## 2020-08-31 NOTE — Telephone Encounter (Signed)
    MA12/14/2021  Name: Kendra [name]   MRN: [id number]   DOB: [birth year]   AGE: 74 y.o.   GENDER: female   PCP Malfi, [name], FNP.   Referral Reason: Housing  Interventions: Successful outbound call placed to the patient.   Follow up plan: Client will call me back tomorrow with her email address, she accidentally gave me her son's email address   Ekam Besson, AAS Paralegal, [place] . Embedded Care Coordination [facility] Health  Care Management  [address], [place] [address] [email or web address]  [phone]   [email or web address]

## 2020-09-01 ENCOUNTER — Ambulatory Visit: Admission: RE | Admit: 2020-09-01 | Payer: Medicare HMO | Source: Ambulatory Visit

## 2020-09-01 ENCOUNTER — Inpatient Hospital Stay: Payer: Medicare HMO | Admitting: Hospice and Palliative Medicine

## 2020-09-02 ENCOUNTER — Other Ambulatory Visit: Payer: Self-pay

## 2020-09-02 ENCOUNTER — Telehealth: Payer: Self-pay

## 2020-09-02 ENCOUNTER — Ambulatory Visit (INDEPENDENT_AMBULATORY_CARE_PROVIDER_SITE_OTHER): Payer: Medicare HMO | Admitting: Physician Assistant

## 2020-09-02 ENCOUNTER — Ambulatory Visit (HOSPITAL_COMMUNITY)
Admission: RE | Admit: 2020-09-02 | Discharge: 2020-09-02 | Disposition: A | Discharge status: Home / Self Care | Payer: Medicare HMO | Source: Ambulatory Visit | Attending: Vascular Surgery | Admitting: Vascular Surgery

## 2020-09-02 VITALS — BP 139/78 | HR 71 | Temp 98.3°F | Resp 20 | Ht 63.0 in | Wt 123.4 lb

## 2020-09-02 DIAGNOSIS — I6523 Occlusion and stenosis of bilateral carotid arteries: Secondary | ICD-10-CM

## 2020-09-02 NOTE — Progress Notes (Signed)
Office Note     CC:  follow up Requesting Provider:  [name], FNP  HPI: Kendra [name] is a 74 y.o. (1946-08-09) female who presents for follow-up status post bilateral carotid endarterectomy.  She underwent right carotid endarterectomy on November 10, 2019 secondary to left-sided weakness.  She then underwent left carotid endarterectomy for asymptomatic disease on December 30, 2019.  She complains of residual left-sided weakness and mild numbness of the right neck extending to her chin.  She also states her incision line is pruritic.  She denies episodes of monocular blindness, slurred speech, facial drooping or right-sided weakness. Her hypertension and glucose are well controlled. She also states she is trying to quit smoking.  The pt is on a statin for cholesterol management.  The pt is on a daily aspirin.   Other AC:  Plavix The pt not on medication for hypertension.   The pt is diabetic.  Tobacco hx: current smoker   Past Medical History:  Diagnosis Date  . Asthma   . Back pain   . Cataract   . Cervical disc disorder   . COPD (chronic obstructive pulmonary disease) ([place])   . Depression   . Diabetes ([place])    type II  . Diabetic neuropathy ([place])   . Dyspnea    with exertion  . GERD (gastroesophageal reflux disease)   . History of kidney stones    per patient "can't remember the year"  . Hyperlipidemia   . Hypertension    per patient "take meds for it"  . Insomnia   . Neuropathy   . Osteoarthritis   . Osteopenia   . Pneumonia 2018   per patient  . Reflux   . Rheumatoid arthritis ([place])   . Stroke ([place]) 2015   and again 2017  - Weakness in left leg, staggers w/ walking, vision   . Tenosynovitis     Past Surgical History:  Procedure Laterality Date  . BREAST BIOPSY Right    Fatty Tumor  . ENDARTERECTOMY Right 10/21/2019   Procedure: ENDARTERECTOMY CAROTID RIGHT;  Surgeon: [name], MD;  Location: [facility];  Service: Vascular;  Laterality:  Right;  . ENDARTERECTOMY Left 12/30/2019   Procedure: LEFT CAROTID ENDARTERECTOMY;  Surgeon: [name], MD;  Location: [facility];  Service: Vascular;  Laterality: Left;  . NECK SURGERY    . OVARIAN CYST REMOVAL    . PATCH ANGIOPLASTY Right 10/21/2019   Procedure: Patch Angioplasty Using XenoSure Biologic Patch Right Carotid;  Surgeon: [name], MD;  Location: [facility];  Service: Vascular;  Laterality: Right;  . PATCH ANGIOPLASTY Left 12/30/2019   Procedure: Patch Angioplasty Left Carotid;  Surgeon: [name], MD;  Location: [facility];  Service: Vascular;  Laterality: Left;    Social History   Socioeconomic History  . Marital status: Widowed    Spouse name: Not on file  . Number of children: Not on file  . Years of education: Not on file  . Highest education level: Not on file  Occupational History  . Not on file  Tobacco Use  . Smoking status: Current Some Day Smoker    Years: 50.00    Types: Cigarettes  . Smokeless tobacco: Former Systems developer    Types: Snuff  . Tobacco comment: .5 of 1 cigarette a day  Vaping Use  . Vaping Use: Never used  Substance and Sexual Activity  . Alcohol use: No  . Drug use: No  . Sexual activity: Never  Other Topics Concern  . Not on file  Social History Narrative  . Not on file   Social Determinants of Health   Financial Resource Strain: Not on file  Food Insecurity: Not on file  Transportation Needs: Not on file  Physical Activity: Not on file  Stress: Not on file  Social Connections: Not on file  Intimate Partner Violence: Not on file   Family History  Problem Relation Age of Onset  . Stroke Father   . Depression Father   . Diabetes Mother   . Hyperlipidemia Mother   . Breast cancer Mother   . Heart murmur Son   . Heart murmur Son   . Heart murmur Son     Current Outpatient Medications  Medication Sig Dispense Refill  . albuterol (VENTOLIN HFA) 108 (90 Base) MCG/ACT inhaler INHALE 1-2 PUFFS EVERY  6 HOURS AS NEEDEDSHORTNESS OF BREATH AND WHEEZING 8.5 g 1  . aspirin 325 MG tablet Take 325 mg by mouth daily.    [name] atorvastatin (LIPITOR) 40 MG tablet TAKE 1 TABLET BY MOUTH ONCE DAILY 90 tablet 0  . Blood Glucose Monitoring Suppl (ONETOUCH VERIO) w/Device KIT USE AS DIRECTED 1 kit 0  . buPROPion (WELLBUTRIN XL) 150 MG 24 hr tablet TAKE 1 TABLET BY MOUTH ONCE DAILY 90 tablet 0  . Cholecalciferol (VITAMIN D3) 50 MCG (2000 UT) capsule Take 1 capsule (2,000 Units total) by mouth daily. 90 capsule 1  . clopidogrel (PLAVIX) 75 MG tablet TAKE 1 TABLET BY MOUTH ONCE DAILY 90 tablet 1  . cyclobenzaprine (FLEXERIL) 5 MG tablet Take 0.5-1 tablets (2.5-5 mg total) by mouth 3 (three) times daily as needed for muscle spasms. 30 tablet 1  . diclofenac Sodium (VOLTAREN) 1 % GEL APPLY 2 GRAMS TOPICALLY 4 TIMES DAILY (Patient not taking: Reported on 06/29/2020) 100 g 0  . gentamicin cream (GARAMYCIN) 0.1 % Apply 1 application topically 2 (two) times daily. 30 g 1  . glucose blood (ONETOUCH VERIO) test strip Use as instructed 200 each 4  . Lancet Devices (ONE TOUCH DELICA LANCING DEV) MISC 1 Device by Does not apply route 2 (two) times daily. 200 each 4  . lisinopril (ZESTRIL) 5 MG tablet Take 1 tablet (5 mg total) by mouth daily. 90 tablet 3  . metFORMIN (GLUCOPHAGE-XR) 500 MG 24 hr tablet TAKE 1 TABLET BY MOUTH TWICE DAILY BEFORE A MEAL 60 tablet 3  . mupirocin ointment (BACTROBAN) 2 % Apply 1 application topically daily. 22 g 0  . nicotine polacrilex (COMMIT) 4 MG lozenge Take 4 mg by mouth as needed for smoking cessation.    . ondansetron (ZOFRAN) 4 MG tablet Take 2 tablets (8 mg total) by mouth every 8 (eight) hours as needed for nausea or vomiting. 20 tablet 0  . OneTouch Delica Lancets 45Y MISC 1 Device by Does not apply route 2 (two) times daily. 200 each 4  . traZODone (DESYREL) 50 MG tablet Take 0.5-1 tablets (25-50 mg total) by mouth at bedtime as needed for sleep. 30 tablet 1  . TRELEGY ELLIPTA  100-62.5-25 MCG/INH AEPB INHALE 1 PUFF INTO THE LUNGS ONCE DAILY 60 each 1   No current facility-administered medications for this visit.    Allergies  Allergen Reactions  . Citalopram Nausea And Vomiting     REVIEW OF SYSTEMS:   '[X]'$  denotes positive finding, $RemoveBeforeDEI'[ ]'qosdMmpfhkfoitBM$  denotes negative finding Cardiac  Comments:  Chest pain or chest pressure:    Shortness of breath upon exertion:    Short  of breath when lying flat:    Irregular heart rhythm:        Vascular    Pain in calf, thigh, or hip brought on by ambulation:    Pain in feet at night that wakes you up from your sleep:     Blood clot in your veins:    Leg swelling:         Pulmonary    Oxygen at home:    Productive cough:     Wheezing:         Neurologic    Sudden weakness in arms or legs:     Sudden numbness in arms or legs:     Sudden onset of difficulty speaking or slurred speech:    Temporary loss of vision in one eye:     Problems with dizziness:         Gastrointestinal    Blood in stool:     Vomited blood:         Genitourinary    Burning when urinating:     Blood in urine:        Psychiatric    Major depression:         Hematologic    Bleeding problems:    Problems with blood clotting too easily:        Skin    Rashes or ulcers:        Constitutional    Fever or chills:      PHYSICAL EXAMINATION:  Vitals:   09/02/20 1440 09/02/20 1443  BP: 133/77 139/78  Pulse: 71   Resp: 20   Temp: 98.3 F (36.8 C)   TempSrc: Temporal   SpO2: 98%   Weight: 123 lb 6.4 oz (56 kg)   Height: $Remove'5\' 3"'rucdbya$  (1.6 m)     General:  WDWN in NAD; vital signs documented above Gait: unaided; no ataxia HENT: WNL, normocephalic Pulmonary: normal non-labored breathing , without Rales, rhonchi,  wheezing Cardiac: irregular HR, without  Murmurs without carotid bruit Abdomen: soft, NT, no masses Skin: without rashes Vascular Exam/Pulses: She has 2+ bilateral symmetrical radial pulses.  2+ brachial pulses  bilaterally Extremities: without ischemic changes, without Gangrene , without cellulitis; without open wounds;  Musculoskeletal: no muscle wasting or atrophy.  Her biceps and triceps strength are 5/5 bilaterally.  Quadricep and hamstring strength also 5/5 bilaterally.  Neurologic: A&O X 3;  No focal weakness or paresthesias are detected Psychiatric:  The pt has Normal affect.   Non-Invasive Vascular Imaging:   09/02/2020 Right Carotid: There is no evidence of stenosis in the right ICA.   Left Carotid: There is no evidence of stenosis in the left ICA.   Vertebrals: Bilateral vertebral arteries demonstrate antegrade flow.  Subclavians: Normal flow hemodynamics were seen in bilateral subclavian arteries.   ASSESSMENT/PLAN:: 74 y.o. female here for follow up for bilateral carotid artery stenosis.  She is now 10 months postop right CEA and 8 months postop left CEA.  No residual neurologic deficits.  She is asymptomatic.  We discussed the importance of good glucose and blood pressure control.  I reemphasized the need for smoking cessation.  We reviewed the signs and symptoms of stroke/TIA and advised her to seek immediate medical attention should these occur.  Follow-up in 1 year with bilateral carotid duplex sonography.  Barbie Banner, PA-C [facility] [phone]  Clinic MD:   Scot Dock

## 2020-09-02 NOTE — Telephone Encounter (Signed)
    MA12/16/2021   Name: Kendra [name]   MRN: [id number]   DOB: 13-Nov-1945   AGE: 74 y.o.   GENDER: female   PCP Malfi, [name], FNP.   Referral Reason: Housing resources  Interventions: A HIPAA compliant voice message was left requesting a return call. Follow up call placed to the patient to discuss status of referral Emailed housing to new email [email or web address]  Follow up plan: Care guide will follow up with patient by phone over the next 5 days to ensure patient has received housing resources.    Itzae Miralles, AAS Paralegal, [place] . Embedded Care Coordination [name] Health  Care Management  [address], [place] [address] [email or web address]  [phone]   [email or web address]

## 2020-09-03 ENCOUNTER — Telehealth: Payer: Self-pay

## 2020-09-03 NOTE — Telephone Encounter (Signed)
    MA12/17/2021   Name: Kendra [name]   MRN: [id number]   DOB: Oct 16, 1945   AGE: 74 y.o.   GENDER: female   PCP Malfi, [name], FNP.   Referral Reason: Housing resources  Interventions: Successful outbound call placed to the patient Follow up call placed to the patient to discuss status of referral Patient has not checked her email to see if she received resources. Request that I call her next week.  Follow up plan: Care guide will follow up with patient by phone over the next 3 days   Sujey Gundry, AAS Paralegal, [place] . Embedded Care Coordination [facility] Health  Care Management  [address], [place] [address] [email or web address]  [phone]   [email or web address]

## 2020-09-06 ENCOUNTER — Inpatient Hospital Stay: Payer: Medicare HMO

## 2020-09-06 ENCOUNTER — Other Ambulatory Visit: Payer: Self-pay | Admitting: Family Medicine

## 2020-09-06 ENCOUNTER — Telehealth: Payer: Self-pay

## 2020-09-06 DIAGNOSIS — F17210 Nicotine dependence, cigarettes, uncomplicated: Secondary | ICD-10-CM | POA: Diagnosis not present

## 2020-09-06 DIAGNOSIS — M069 Rheumatoid arthritis, unspecified: Secondary | ICD-10-CM | POA: Diagnosis not present

## 2020-09-06 DIAGNOSIS — R803 Bence Jones proteinuria: Secondary | ICD-10-CM

## 2020-09-06 DIAGNOSIS — E538 Deficiency of other specified B group vitamins: Secondary | ICD-10-CM

## 2020-09-06 DIAGNOSIS — D649 Anemia, unspecified: Secondary | ICD-10-CM | POA: Diagnosis not present

## 2020-09-06 DIAGNOSIS — Z79899 Other long term (current) drug therapy: Secondary | ICD-10-CM | POA: Diagnosis not present

## 2020-09-06 DIAGNOSIS — Z794 Long term (current) use of insulin: Secondary | ICD-10-CM

## 2020-09-06 MED ORDER — CYANOCOBALAMIN 1000 MCG/ML IJ SOLN
1000.0000 ug | Freq: Once | INTRAMUSCULAR | Status: AC
  Administered 2020-09-06: 1000 ug via INTRAMUSCULAR
  Filled 2020-09-06: qty 1

## 2020-09-06 NOTE — Telephone Encounter (Signed)
    MA12/20/2021 1st Attempt  Name: Kendra [name]   MRN: [id number]   DOB: 02-07-1946   AGE: 74 y.o.   GENDER: female   PCP [place], [name], FNP.   Referral Reason: Housing  Interventions: Successful outbound call placed to the patient Follow up call placed to the patient to discuss status of referral  Follow up plan: Client will call me once she has checked her email. Also emailed housing resources to [name] to leave at the front desk for the patient. She has an appointment on 40/98 @ 1:19JY   Kendra [name], AAS Paralegal, [place] . Embedded Care Coordination [name]  Care Management  [address], [place] [address] [email or web address]  [phone]   [email or web address]

## 2020-09-06 NOTE — Telephone Encounter (Signed)
Requested Prescriptions  Pending Prescriptions Disp Refills  . metFORMIN (GLUCOPHAGE-XR) 500 MG 24 hr tablet [Pharmacy Med Name: METFORMIN HCL ER 500 MG TAB] 60 tablet 0    Sig: TAKE 1 TABLET BY MOUTH TWICE DAILY BEFORE A MEAL     Endocrinology:  Diabetes - Biguanides Passed - 09/06/2020 12:49 PM      Passed - Cr in normal range and within 360 days    Creat  Date Value Ref Range Status  06/14/2020 0.83 0.60 - 0.93 mg/dL Final    Comment:    For patients >67 years of age, the reference limit for Creatinine is approximately 13% higher for people identified as African-American. .    Creatinine, Ser  Date Value Ref Range Status  07/20/2020 0.66 0.44 - 1.00 mg/dL Final         Passed - HBA1C is between 0 and 7.9 and within 180 days    Hemoglobin A1C  Date Value Ref Range Status  09/02/2014 9.5 (H) 4.2 - 6.3 % Final    Comment:    The American Diabetes Association recommends that a primary goal of therapy should be <7% and that physicians should reevaluate the treatment regimen in patients with HbA1c values consistently >8%.    Hgb A1c MFr Bld  Date Value Ref Range Status  06/14/2020 6.1 (H) <5.7 % of total Hgb Final    Comment:    For someone without known diabetes, a hemoglobin  A1c value between 5.7% and 6.4% is consistent with prediabetes and should be confirmed with a  follow-up test. . For someone with known diabetes, a value <7% indicates that their diabetes is well controlled. A1c targets should be individualized based on duration of diabetes, age, comorbid conditions, and other considerations. . This assay result is consistent with an increased risk of diabetes. . Currently, no consensus exists regarding use of hemoglobin A1c for diagnosis of diabetes for children. .          Passed - AA eGFR in normal range and within 360 days    GFR, Est African American  Date Value Ref Range Status  06/14/2020 82 > OR = 60 mL/min/1.65m2 Final   GFR, Est Non African  American  Date Value Ref Range Status  06/14/2020 70 > OR = 60 mL/min/1.2m2 Final   GFR, Estimated  Date Value Ref Range Status  07/20/2020 >60 >60 mL/min Final    Comment:    (NOTE) Calculated using the CKD-EPI Creatinine Equation (2021)          Passed - Valid encounter within last 6 months    Recent Outpatient Visits          2 months ago Insomnia, unspecified type   [facility], [name], FNP   3 months ago Controlled type 2 diabetes mellitus with complication, with long-term current use of insulin [facility])   [facility], [name], FNP   7 months ago Right hand pain   [facility], [name], FNP   7 months ago Controlled type 2 diabetes mellitus with complication, with long-term current use of insulin [facility])   [name], [name], FNP   7 months ago Ear pain, left   [facility], [name], FNP      Future Appointments            In 3 days [place], [name], [facility], [facility]  In 2 weeks Verlon Au, NP [facility]           phone call to pt.  Scheduled for 3 mo. F/u appt. With PCP; 30 day supply given at this time.

## 2020-09-07 ENCOUNTER — Ambulatory Visit: Payer: Self-pay | Admitting: Licensed Clinical Social Worker

## 2020-09-07 NOTE — Chronic Care Management (AMB) (Signed)
  Care Management   Follow Up Note   09/07/2020 Name: Kendra [name] MRN: [id number] DOB: [birth year]  Referred by: [name], FNP Reason for referral : Care Coordination   Kendra [name] is a 74 y.o. year old female who is a primary care patient of [name], FNP. The care management team was consulted for assistance with care management and care coordination needs.    Review of patient status, including review of consultants reports, relevant laboratory and other test results, and collaboration with appropriate care team members and the patient's provider was performed as part of comprehensive patient evaluation and provision of chronic care management services.    CCM LCSW completed care coordination with C3 Guide. CCM LCSW completed call to patient on 09/07/20 and reminded her of her upcoming PCP visit. She reports that she thought she appointment was canceled but CCM LCSW informed her that it was not based on her current appointments. CCM LCSW advised patient to contact [facility] to question about this since she thought someone informed her that this was canceled. CCM LCSW informed patient that housing resources that she requested were printed out at [facility] front desk for her to pick up on 09/09/20. Patient expressed appreciation for this education and appointment reminder.    [name], BSW, MSW, [email or web address] Phone: [phone]

## 2020-09-08 LAB — IFE AND PE, RANDOM URINE
% BETA, Urine: 17 %
ALPHA 1 URINE: 6.1 %
Albumin, U: 35.2 %
Alpha 2, Urine: 11.9 %
GAMMA GLOBULIN URINE: 29.8 %
M-SPIKE %, Urine: 12.7 % — ABNORMAL HIGH
Total Protein, Urine: 20.9 mg/dL

## 2020-09-09 ENCOUNTER — Ambulatory Visit: Payer: Self-pay | Admitting: Licensed Clinical Social Worker

## 2020-09-09 ENCOUNTER — Ambulatory Visit: Payer: Self-pay | Admitting: Family Medicine

## 2020-09-09 DIAGNOSIS — E1159 Type 2 diabetes mellitus with other circulatory complications: Secondary | ICD-10-CM

## 2020-09-09 DIAGNOSIS — Z794 Long term (current) use of insulin: Secondary | ICD-10-CM

## 2020-09-09 DIAGNOSIS — Z59819 Housing instability, housed unspecified: Secondary | ICD-10-CM

## 2020-09-09 DIAGNOSIS — F324 Major depressive disorder, single episode, in partial remission: Secondary | ICD-10-CM

## 2020-09-09 DIAGNOSIS — I152 Hypertension secondary to endocrine disorders: Secondary | ICD-10-CM

## 2020-09-09 DIAGNOSIS — Z599 Problem related to housing and economic circumstances, unspecified: Secondary | ICD-10-CM

## 2020-09-09 DIAGNOSIS — E1169 Type 2 diabetes mellitus with other specified complication: Secondary | ICD-10-CM

## 2020-09-09 NOTE — Chronic Care Management (AMB) (Signed)
Chronic Care Management    Clinical Social Work Follow Up Note  09/09/2020 Name: Kendra [name] MRN: [id number] DOB: [birth year]  Kendra [name] is a 74 y.o. year old female who is a primary care patient of [name], FNP. The CCM team was consulted for assistance with [employer] .   Review of patient status, including review of consultants reports, other relevant assessments, and collaboration with appropriate care team members and the patient's provider was performed as part of comprehensive patient evaluation and provision of chronic care management services.    SDOH (Social Determinants of Health) assessments performed: Yes    Outpatient Encounter Medications as of 09/09/2020  Medication Sig  . albuterol (VENTOLIN HFA) 108 (90 Base) MCG/ACT inhaler INHALE 1-2 PUFFS EVERY 6 HOURS AS NEEDEDSHORTNESS OF BREATH AND WHEEZING  . aspirin 325 MG tablet Take 325 mg by mouth daily.  [name] atorvastatin (LIPITOR) 40 MG tablet TAKE 1 TABLET BY MOUTH ONCE DAILY  . Blood Glucose Monitoring Suppl (ONETOUCH VERIO) w/Device KIT USE AS DIRECTED  . buPROPion (WELLBUTRIN XL) 150 MG 24 hr tablet TAKE 1 TABLET BY MOUTH ONCE DAILY  . Cholecalciferol (VITAMIN D3) 50 MCG (2000 UT) capsule Take 1 capsule (2,000 Units total) by mouth daily.  . clopidogrel (PLAVIX) 75 MG tablet TAKE 1 TABLET BY MOUTH ONCE DAILY  . cyclobenzaprine (FLEXERIL) 5 MG tablet Take 0.5-1 tablets (2.5-5 mg total) by mouth 3 (three) times daily as needed for muscle spasms.  . diclofenac Sodium (VOLTAREN) 1 % GEL APPLY 2 GRAMS TOPICALLY 4 TIMES DAILY (Patient not taking: Reported on 06/29/2020)  . gentamicin cream (GARAMYCIN) 0.1 % Apply 1 application topically 2 (two) times daily.  [name] glucose blood (ONETOUCH VERIO) test strip Use as instructed  . Lancet Devices (ONE TOUCH DELICA LANCING DEV) MISC 1 Device by Does not apply route 2 (two) times daily.  [name] lisinopril (ZESTRIL) 5 MG tablet Take 1 tablet (5 mg total) by mouth  daily.  . metFORMIN (GLUCOPHAGE-XR) 500 MG 24 hr tablet TAKE 1 TABLET BY MOUTH TWICE DAILY BEFORE A MEAL  . mupirocin ointment (BACTROBAN) 2 % Apply 1 application topically daily.  . nicotine polacrilex (COMMIT) 4 MG lozenge Take 4 mg by mouth as needed for smoking cessation.  . ondansetron (ZOFRAN) 4 MG tablet Take 2 tablets (8 mg total) by mouth every 8 (eight) hours as needed for nausea or vomiting.  [name] Lancets 66Y MISC 1 Device by Does not apply route 2 (two) times daily.  . traZODone (DESYREL) 50 MG tablet Take 0.5-1 tablets (25-50 mg total) by mouth at bedtime as needed for sleep.  . TRELEGY ELLIPTA 100-62.5-25 MCG/INH AEPB INHALE 1 PUFF INTO THE LUNGS ONCE DAILY   No facility-administered encounter medications on file as of 09/09/2020.     Goals Addressed    .  SW-I probably could use some extra support right now. (pt-stated)        Timeframe:  Long-Range Goal Priority:  Medium Start Date:   09/09/20                          Expected End Date: 12/01/20                 Follow Up Date -90 days from 09/09/20  - check out options for in-home help, long-term care or hospice - complete a living will - discuss my treatment options with the doctor or nurse - do one enjoyable  thing every day - do something different, like talking to a new person or going to a new place, every day - learn something new by asking, reading and searching the Internet every day - make an audio or video recording for my loved ones - make shared treatment decisions with doctor - meditate daily - name a health care proxy (decision maker) - share memories using a picture album or scrapbook with my loved ones - spend time with a child every day, borrow one if I have to - spend time outdoors at least 3 times a week - strengthen or fix relationships with loved ones    Why is this important?    Having a long-term illness can be scary.   It can also be stressful for you and your caregiver.    These steps may help.   Current Barriers:  . Financial constraints . Limited social support . ADL IADL limitations . Mental Health Concerns  . Social Isolation . Limited access to caregiver . Inability to perform ADL's independently . Inability to perform IADL's independently . Lacks knowledge of community resource: grief support resources within the area  Clinical Social Work Clinical Goal(s):  [name] Over the next 120 days, patient will work with SW to address concerns related to gaining additional support within the home and resource connection in order to maintain health and independency within the community  . Over the next 120 days, patient will demonstrate improved adherence to self care as evidenced by implementing healthy self-care into her daily routine such as: attending all medical appointments, taking time for self-reflection, taking medications as prescribed, drinking water and daily exercise to improve mobility.  Interventions: . Patient interviewed and appropriate assessments performed . Education provided on how to implement appropriate self-care and coping tools into her daily routine. Patient was receptive to this education. . Patient reports that her feet continue to swell even though she elevates them on a pillow regularly. Patient reports that her right toe is red and irritated. Patient shares that she put some pain relief ointment on it last night which helped. . Provided mental health counseling with regard to current stressors with finding stable housing. LCSW used active and reflective listening and implemented appropriate interventions to help suppport patient and her emotional needs. Advised patient to implement deep breathing/grounding/meditation/self-care exercises into her daily routine to combat stressors. Education provided.  [name] LCSW was informed that patient's sleep hygiene and routine is the same. She reports ongoing difficulty with staying asleep at night which  affects her energy, mobility and motivation. Patient reports interest in gaining medication assistance for this concern. Patient ask for [name] office number so that she can set up appointment with PCP to discuss. LCSW provided education on healthy sleep hygiene and what that looks like. LCSW encouraged patient to implement a night time routine into her schedule that works best for her and that she is able to maintain. Advised patient to implement deep breathing/grounding/meditation/self-care exercises into her nightly routine to combat racing thoughts at night.   . Provided patient with information about mental health and financial support resources within her area. Patient denies needing this support at this time but was appreciative of education that was provided.  . Discussed plans with patient for ongoing care management follow up and provided patient with direct contact information for care management team . Patient reports that she has been managing her blood pressure very well and is proud of this accomplishment. LCSW provided education on healthy self-care  and provide positive reinforcement for taking action to improve her overall health and symptoms.  . Assisted patient/caregiver with obtaining information about health plan benefits . LCSW used active and reflective listening and implemented appropriate interventions to help suppport patient and her emotional needs. . Provided patient with information about LTC placement as patient already has Medicaid secondary insurance coverage. LCSW also provided patient with education on available transportation resources through [facility] and [facility]. Patient will need to contact her Medicaid caseworker at [facility] to notify them that she wishes to sign up for transportation services. Patient reports that her son is currently providing stable transportation for her to all of her medical appointments but she continues to miss scheduled appointments. Patient has still not  contacted Medicaid caseworker to set up transportation services and discuss other further benefits that are available to her. Patient  was encouraged to do so today.  . Evaluated patient's stress level. Emotional Support provided due to ongoing health complications.  [name] 1:1 collaboration with PCP regarding development and update of comprehensive plan of care as evidenced by provider attestation and co-signature  . Discussed plans with patient for ongoing care management follow up and provided patient with direct contact information for care management team . Advised patient to contact CCM providers if she has any concerns in regards to her safety, housing or well-being. Patient confirms that her son manages all of her finances and she declines having any issues with this at this time other than needing new housing.  . Assisted patient/caregiver with obtaining information about health plan benefits . Provided education and assistance to client regarding Advanced Directives. . Provided education to patient/caregiver regarding level of care options. . Patient confirms to struggle with ongoing headaches which causes her stress/pain/fatigue. Emotional support provided and patient was receptive to this. .  Positive reinforcement provided for keeping up with all of her medical appointments in the past. However, patient missed her PCP appointment this week because she did not call transportation 3 days in advance to set up arrangements. Patient was informed that housing resources that were left at front desk will be mailed to her instead. Patient was advised to contact [facility] to reschedule office visit with PCP. [name] Patient reports that her ramp is still effectively working for her. . Patient declines any urgent case management needs at this time. Patient confirms having food at this time. Referrals made in the past for food insecurity.  Patient Self Care Activities:  . Attends all scheduled provider  appointments . Calls provider office for new concerns or questions  Please see past updates related to this goal by clicking on the "Past Updates" button in the selected goal       Follow Up Plan: SW will follow up with patient by phone over the next quarter  [name], [place], MSW, [email or web address] Phone: [phone]

## 2020-09-13 ENCOUNTER — Other Ambulatory Visit: Payer: Self-pay | Admitting: Family Medicine

## 2020-09-13 ENCOUNTER — Ambulatory Visit: Payer: Medicare HMO | Admitting: Pharmacist

## 2020-09-13 ENCOUNTER — Inpatient Hospital Stay: Payer: Medicare HMO

## 2020-09-13 DIAGNOSIS — F17200 Nicotine dependence, unspecified, uncomplicated: Secondary | ICD-10-CM

## 2020-09-13 DIAGNOSIS — E1159 Type 2 diabetes mellitus with other circulatory complications: Secondary | ICD-10-CM

## 2020-09-13 DIAGNOSIS — Z794 Long term (current) use of insulin: Secondary | ICD-10-CM

## 2020-09-13 DIAGNOSIS — I152 Hypertension secondary to endocrine disorders: Secondary | ICD-10-CM

## 2020-09-13 DIAGNOSIS — G4700 Insomnia, unspecified: Secondary | ICD-10-CM

## 2020-09-13 DIAGNOSIS — G8929 Other chronic pain: Secondary | ICD-10-CM

## 2020-09-13 DIAGNOSIS — F3341 Major depressive disorder, recurrent, in partial remission: Secondary | ICD-10-CM

## 2020-09-13 MED ORDER — TRAZODONE HCL 50 MG PO TABS: 25.0000 mg | ORAL_TABLET | Freq: Every evening | ORAL | 1 refills | Status: DC | PRN

## 2020-09-13 NOTE — Patient Instructions (Signed)
Thank you allowing the Chronic Care Management Team to be a part of your care! It was a pleasure speaking with you today!     CCM (Chronic Care Management) Team    Alto Denver RN, MSN, CCM Nurse Care Coordinator  [phone]   Duanne Moron PharmD  Clinical Pharmacist  [phone]   Dickie La LCSW Clinical Social Worker [phone]  Visit Information  Goals Addressed            This Visit's Progress   . PharmD-Medication Adherence       Current Barriers:  . Financial Barriers . Knowledge deficits related to coordination of her own care . Limited social support   Pharmacist Clinical Goal(s):   [name] Over the next 30 days, patient will demonstrate improved medication adherence as evidenced by verbalized understanding of prescribed medication regimen, assistance available, and patient report of adherence  Interventions: . Perform chart review  Hypertension . Reports taking lisinopril 5 mg once daily from pill pack . Reports recent home blood pressure readings: ? 12/26: 131/94, HR 83 ? 12/22: 128/84, HR 82 . Encourage patient to continue to monitor at home, keep log and bring record with her to medical appointments  Type 2 Diabetes . Reports taking metformin ER 500 mg twice daily from pill pack . Reports recent fasting readings ranging 85-184 . Counsel on importance of having regular well-balanced meals and limiting carbohydrate portion sizes . Encourage patient to continue to monitor at home, keep log and bring record with her to medical appointments  Medication Adherence . Confirms continues to take medications as directed from pill packs as received from [name]  o Reports next pack to be delivered tomorrow o Again encourage patient to start using afternoon alarm on phone as additional adherence tool . Reports using maintenance Trelegy inhaler daily (and rinsing mouth out after each use) and rescue (albuterol) as needed as directed.  . Identify  patient in need of refill of trazodone - Advise patient to call pharmacy to request refill  Smoking Cessation . Congratulate patient as she reports not having smoked since Thanksgiving o Encourage patient to continue with cessation  Coorination of care  . Follow up regarding missed appointment with PCP . Encourage patient to call office today to reschedule appointment with PCP . Encourage patient to call [facility] today to confirm dates of upcoming appointments . Encourage patient to then call to schedule transportation for upcoming appointments including with [facility] and PCP o Reports will call [employer] to schedule trasportation. Confirm patient also has phone numbers for other transportation resources, including [employer] and [employer], as provided by Care Guide  Patient Self Care Activities:  . Patient takes medications as directed with aid of adherence tools. o Using pill packaging from [facility] . Calls pharmacy for medication refills . Patient to attend scheduled medical appointments . Calls provider office for new concerns or questions  . Patient to check blood sugars and keep log . Patient to check blood pressure daily and keep log  Please see past updates related to this goal by clicking on the "Past Updates" button in the selected goal          The patient verbalized understanding of instructions, educational materials, and care plan provided today and declined offer to receive copy of patient instructions, educational materials, and care plan.   Telephone follow up appointment with care management team member scheduled for: 10/04/2020 at 1:15 pm  Duanne Moron, PharmD, [facility] Clinical Pharmacist [place]  [facility] [employer] [phone]

## 2020-09-13 NOTE — Telephone Encounter (Signed)
Medication Refill - Medication: cyclobenzaprine (FLEXERIL) 5 MG tablet traZODone (DESYREL) 50 MG tablet    Has the patient contacted their pharmacy? Yes.   (Agent: If no, request that the patient contact the pharmacy for the refill.) (Agent: If yes, when and what did the pharmacy advise?)  Preferred Pharmacy (with phone number or street name):  [facility], [place]  [place] [address]  Phone: [phone] Fax: [phone]     Agent: Please be advised that RX refills may take up to 3 business days. We ask that you follow-up with your pharmacy.

## 2020-09-13 NOTE — Chronic Care Management (AMB) (Signed)
Chronic Care Management   Follow Up Note   09/13/2020 Name: Kendra [name] MRN: [id number] DOB: 03/29/1946  Referred by: [name], FNP Reason for referral : Chronic Care Management (Patient Phone Call)   Kendra [name] is a 74 y.o. year old female who is a primary care patient of [name], FNP. The CCM team was consulted for assistance with chronic disease management and care coordination needs.    I reached out to [employer] by phone today.   Review of patient status, including review of consultants reports, relevant laboratory and other test results, and collaboration with appropriate care team members and the patient's provider was performed as part of comprehensive patient evaluation and provision of chronic care management services.    SDOH (Social Determinants of Health) assessments performed: No See Care Plan activities for detailed interventions related to [facility])     Outpatient Encounter Medications as of 09/13/2020  Medication Sig  . albuterol (VENTOLIN HFA) 108 (90 Base) MCG/ACT inhaler INHALE 1-2 PUFFS EVERY 6 HOURS AS NEEDEDSHORTNESS OF BREATH AND WHEEZING  . lisinopril (ZESTRIL) 5 MG tablet Take 1 tablet (5 mg total) by mouth daily.  . metFORMIN (GLUCOPHAGE-XR) 500 MG 24 hr tablet TAKE 1 TABLET BY MOUTH TWICE DAILY BEFORE A MEAL  . aspirin 325 MG tablet Take 325 mg by mouth daily.  [name] atorvastatin (LIPITOR) 40 MG tablet TAKE 1 TABLET BY MOUTH ONCE DAILY  . Blood Glucose Monitoring Suppl (ONETOUCH VERIO) w/Device KIT USE AS DIRECTED  . buPROPion (WELLBUTRIN XL) 150 MG 24 hr tablet TAKE 1 TABLET BY MOUTH ONCE DAILY  . Cholecalciferol (VITAMIN D3) 50 MCG (2000 UT) capsule Take 1 capsule (2,000 Units total) by mouth daily.  . clopidogrel (PLAVIX) 75 MG tablet TAKE 1 TABLET BY MOUTH ONCE DAILY  . cyclobenzaprine (FLEXERIL) 5 MG tablet Take 0.5-1 tablets (2.5-5 mg total) by mouth 3 (three) times daily as needed for muscle spasms.  . diclofenac Sodium  (VOLTAREN) 1 % GEL APPLY 2 GRAMS TOPICALLY 4 TIMES DAILY (Patient not taking: Reported on 06/29/2020)  . gentamicin cream (GARAMYCIN) 0.1 % Apply 1 application topically 2 (two) times daily.  [name] glucose blood (ONETOUCH VERIO) test strip Use as instructed  . Lancet Devices (ONE TOUCH DELICA LANCING DEV) MISC 1 Device by Does not apply route 2 (two) times daily.  . mupirocin ointment (BACTROBAN) 2 % Apply 1 application topically daily.  . nicotine polacrilex (COMMIT) 4 MG lozenge Take 4 mg by mouth as needed for smoking cessation.  . ondansetron (ZOFRAN) 4 MG tablet Take 2 tablets (8 mg total) by mouth every 8 (eight) hours as needed for nausea or vomiting.  Letta Pate Delica Lancets 33G MISC 1 Device by Does not apply route 2 (two) times daily.  . traZODone (DESYREL) 50 MG tablet Take 0.5-1 tablets (25-50 mg total) by mouth at bedtime as needed for sleep.  . TRELEGY ELLIPTA 100-62.5-25 MCG/INH AEPB INHALE 1 PUFF INTO THE LUNGS ONCE DAILY   No facility-administered encounter medications on file as of 09/13/2020.    Goals Addressed            This Visit's Progress   . PharmD-Medication Adherence       Current Barriers:  . Financial Barriers . Knowledge deficits related to coordination of her own care . Limited social support   Pharmacist Clinical Goal(s):   [name] Over the next 30 days, patient will demonstrate improved medication adherence as evidenced by verbalized understanding of prescribed medication  regimen, assistance available, and patient report of adherence  Interventions: . Perform chart review  Hypertension . Reports taking lisinopril 5 mg once daily from pill pack . Reports recent home blood pressure readings: ? 12/26: 131/94, HR 83 ? 12/22: 128/84, HR 82 . Encourage patient to continue to monitor at home, keep log and bring record with her to medical appointments  Type 2 Diabetes . Reports taking metformin ER 500 mg twice daily from pill pack . Reports recent fasting  readings ranging 85-184 . Counsel on importance of having regular well-balanced meals and limiting carbohydrate portion sizes . Encourage patient to continue to monitor at home, keep log and bring record with her to medical appointments  Medication Adherence . Confirms continues to take medications as directed from pill packs as received from [facility]  o Reports next pack to be delivered tomorrow o Again encourage patient to start using afternoon alarm on phone as additional adherence tool . Reports using maintenance Trelegy inhaler daily (and rinsing mouth out after each use) and rescue (albuterol) as needed as directed.  . Identify patient in need of refill of trazodone - Advise patient to call pharmacy to request refill  Smoking Cessation . Congratulate patient as she reports not having smoked since Thanksgiving o Encourage patient to continue with cessation  Coorination of care  . Follow up regarding missed appointment with PCP . Encourage patient to call office today to reschedule appointment with PCP . Encourage patient to call [facility] today to confirm dates of upcoming appointments . Encourage patient to then call to schedule transportation for upcoming appointments including with [facility] and PCP o Reports will call [employer] to schedule trasportation. Confirm patient also has phone numbers for other transportation resources, including [employer] and [employer], as provided by Care Guide  Patient Self Care Activities:  . Patient takes medications as directed with aid of adherence tools. o Using pill packaging from [facility] . Calls pharmacy for medication refills . Patient to attend scheduled medical appointments . Calls provider office for new concerns or questions  . Patient to check blood sugars and keep log . Patient to check blood pressure daily and keep log  Please see past updates related to this goal by clicking on the "Past Updates" button in the selected goal           Plan  Telephone follow up appointment with care management team member scheduled for: 10/04/2020 at 1:15 pm  [name], PharmD, [place] [phone]

## 2020-09-13 NOTE — Telephone Encounter (Signed)
Requested medication (s) are due for refill today: Yes  Requested medication (s) are on the active medication list: Yes  Last refill:  06/29/20  Future visit scheduled: Yes  Notes to clinic:  Unable to refill per protocol, cannot delegate     Requested Prescriptions  Pending Prescriptions Disp Refills   cyclobenzaprine (FLEXERIL) 5 MG tablet 30 tablet 1    Sig: Take 0.5-1 tablets (2.5-5 mg total) by mouth 3 (three) times daily as needed for muscle spasms.      Not Delegated - Analgesics:  Muscle Relaxants Failed - 09/13/2020  6:36 PM      Failed - This refill cannot be delegated      Passed - Valid encounter within last 6 months    Recent Outpatient Visits           2 months ago Insomnia, unspecified type   [facility], Jodelle Gross, FNP   3 months ago Controlled type 2 diabetes mellitus with complication, with long-term current use of insulin [facility])   [facility], Jodelle Gross, FNP   7 months ago Right hand pain   [facility], Jodelle Gross, FNP   7 months ago Controlled type 2 diabetes mellitus with complication, with long-term current use of insulin [name])   [name], Jodelle Gross, FNP   7 months ago Ear pain, left   [facility], Jodelle Gross, [place]       Future Appointments             In 1 week M[place], Jodelle Gross, FNP [facility]   In 1 week Alinda Dooms, NP [facility]              Signed Prescriptions Disp Refills   traZODone (DESYREL) 50 MG tablet 90 tablet 1    Sig: Take 0.5-1 tablets (25-50 mg total) by mouth at bedtime as needed for sleep.      Psychiatry: Antidepressants - Serotonin Modulator Passed - 09/13/2020  6:36 PM      Passed - Completed PHQ-2 or PHQ-9 in the last 360 days      Passed - Valid encounter within last 6 months    Recent Outpatient Visits           2 months ago Insomnia,  unspecified type   [facility], Jodelle Gross, FNP   3 months ago Controlled type 2 diabetes mellitus with complication, with long-term current use of insulin [facility])   [facility], Jodelle Gross, FNP   7 months ago Right hand pain   [name], Jodelle Gross, FNP   7 months ago Controlled type 2 diabetes mellitus with complication, with long-term current use of insulin [facility])   [facility], Jodelle Gross, FNP   7 months ago Ear pain, left   [facility], Jodelle Gross, [place]       Future Appointments             In 1 week Malfi, Jodelle Gross, FNP [facility], [facility]   In 1 week Alinda Dooms, NP [name] Medical Oncology

## 2020-09-14 MED ORDER — CYCLOBENZAPRINE HCL 5 MG PO TABS
2.5000 mg | ORAL_TABLET | Freq: Three times a day (TID) | ORAL | 1 refills | Status: DC | PRN
Start: 2020-09-14 — End: 2021-01-06

## 2020-09-20 ENCOUNTER — Ambulatory Visit: Payer: Self-pay | Admitting: Family Medicine

## 2020-09-20 ENCOUNTER — Inpatient Hospital Stay: Payer: Medicare Other | Attending: Oncology

## 2020-09-21 ENCOUNTER — Inpatient Hospital Stay: Payer: Medicare Other | Admitting: Hospice and Palliative Medicine

## 2020-09-21 ENCOUNTER — Ambulatory Visit: Admission: RE | Admit: 2020-09-21 | Payer: Medicare Other | Source: Ambulatory Visit

## 2020-09-21 ENCOUNTER — Encounter: Payer: Self-pay | Admitting: Nurse Practitioner

## 2020-09-21 ENCOUNTER — Telehealth: Payer: Self-pay

## 2020-09-21 DIAGNOSIS — R803 Bence Jones proteinuria: Secondary | ICD-10-CM

## 2020-09-21 NOTE — Telephone Encounter (Signed)
Patient notified.  She will go for the skeletal survey one day this week.    Please call patient to scheduled the MD virtual appt next week.

## 2020-09-21 NOTE — Telephone Encounter (Signed)
-----   Message from Rickard Patience, MD sent at 09/20/2020  8:59 PM EST ----- Urine protein electrophoresis showed M protein- Bence Jones protein positive. Please arrange her to get skeletal survey.  I will need to see her sooner. Please add a virtual visit after xray is done. Thanks.

## 2020-09-21 NOTE — Telephone Encounter (Signed)
Done. Pt is aware.

## 2020-09-23 ENCOUNTER — Ambulatory Visit (INDEPENDENT_AMBULATORY_CARE_PROVIDER_SITE_OTHER): Payer: Medicare Other | Admitting: Family Medicine

## 2020-09-23 ENCOUNTER — Encounter: Payer: Self-pay | Admitting: Family Medicine

## 2020-09-23 ENCOUNTER — Ambulatory Visit: Payer: Self-pay | Admitting: Family Medicine

## 2020-09-23 ENCOUNTER — Other Ambulatory Visit: Payer: Self-pay

## 2020-09-23 VITALS — BP 112/69 | HR 81 | Temp 97.1°F | Resp 18 | Ht 63.0 in | Wt 123.6 lb

## 2020-09-23 DIAGNOSIS — E118 Type 2 diabetes mellitus with unspecified complications: Secondary | ICD-10-CM

## 2020-09-23 DIAGNOSIS — Z794 Long term (current) use of insulin: Secondary | ICD-10-CM

## 2020-09-23 DIAGNOSIS — R432 Parageusia: Secondary | ICD-10-CM | POA: Diagnosis not present

## 2020-09-23 LAB — POCT GLYCOSYLATED HEMOGLOBIN (HGB A1C): Hemoglobin A1C: 5.8 % — AB (ref 4.0–5.6)

## 2020-09-23 NOTE — Assessment & Plan Note (Signed)
Loss of taste x < 1 week.  Discussed possibility of COVID based on symptom, no other symptoms.  Will swab for COVID and send to lab for testing.

## 2020-09-23 NOTE — Progress Notes (Signed)
Subjective:    Patient ID: Kendra [name], female    DOB: December 06, 1945, 75 y.o.   MRN: [id number]  Kendra [name] is a 75 y.o. female presenting on 09/23/2020 for Diabetes Mellitus (Pt state she loss her taste x 1.5 weeks ago)   HPI  Ms. Weyer presents to clinic for a follow up on her diabetes and for concerns of loss of taste for less than 1 week.  Denies fevers, sore throat, cough, SOB, DOE, CP, abdominal pain, n/v/d.  Unknown sick contacts.  Diabetes Pt presents today for follow up Type 2 Diabetes Mellitus.  He/she (caps): She ACTION; IS/IS NOT: is not checking AM CBG at home. -Current diabetic medications include: Metformin XR 500mg  BID WC -ACTION; IS/IS NOT: is not currently symptomatic -Actions; denies/reports/admits to: denies polydipsia, polyphagia, polyuria, headaches, diaphoresis, shakiness, chills, pain, numbness or tingling in extremities or changes in vision -Clinical course has been improving  -Reports no exercise routine -Diet is high in salt, high in fat, and high in carbohydrates  PREVENTION Eye exam current (within 1 year) Due, encouraged to schedule Foot exam current (within 1 year) Up to date Lipid/ASCVD risk reduction - on statin: YES/NO: Yes  Kidney Protection (On ACE/ARB)? YES/NO: Yes   Depression screen [name] 2/9 11/04/2019 09/22/2019 04/30/2019  Decreased Interest 0 3 3  Down, Depressed, Hopeless 1 3 2   PHQ - 2 Score 1 6 5   Altered sleeping 0 0 2  Tired, decreased energy 0 0 1  Change in appetite 1 0 0  Feeling bad or failure about yourself  0 0 0  Trouble concentrating 0 0 0  Moving slowly or fidgety/restless 0 0 1  Suicidal thoughts 0 0 0  PHQ-9 Score 2 6 9   Difficult doing work/chores Not difficult at all Not difficult at all Not difficult at all  Some recent data might be hidden    Social History   Tobacco Use  . Smoking status: Current Some Day Smoker    Packs/day: 0.50    Years: 61.00    Pack years: 30.50    Types: Cigarettes  .  Smokeless tobacco: Former Systems developer    Types: Snuff  . Tobacco comment: .5 of 1 cigarette a day  Vaping Use  . Vaping Use: Never used  Substance Use Topics  . Alcohol use: No  . Drug use: No    Review of Systems  Constitutional: Negative.        Loss of taste  HENT: Negative.   Eyes: Negative.   Respiratory: Negative.   Cardiovascular: Negative.   Gastrointestinal: Negative.   Endocrine: Negative.   Genitourinary: Negative.   Musculoskeletal: Negative.   Skin: Negative.   Allergic/Immunologic: Negative.   Neurological: Negative.   Hematological: Negative.   Psychiatric/Behavioral: Negative.    Per HPI unless specifically indicated above     Objective:    BP 112/69 (BP Location: Right Arm, Patient Position: Sitting, Cuff Size: Normal)   Pulse 81   Temp (!) 97.1 F (36.2 C) (Temporal)   Resp 18   Ht 5\' 3"  (1.6 m)   Wt 123 lb 9.6 oz (56.1 kg)   SpO2 99%   BMI 21.89 kg/m   Wt Readings from Last 3 Encounters:  09/23/20 123 lb 9.6 oz (56.1 kg)  09/02/20 123 lb 6.4 oz (56 kg)  08/20/20 123 lb 8 oz (56 kg)    Physical Exam Vitals and nursing note reviewed.  Constitutional:      General: She is not in  acute distress.    Appearance: Normal appearance. She is well-developed, well-groomed and normal weight. She is not ill-appearing or toxic-appearing.  HENT:     Head: Normocephalic and atraumatic.     Nose: No congestion or rhinorrhea.     Comments: Lizbeth Bark is in place, covering mouth and nose.    Mouth/Throat:     Mouth: Mucous membranes are moist.     Pharynx: No oropharyngeal exudate or posterior oropharyngeal erythema.  Eyes:     General: Lids are normal. Vision grossly intact.        Right eye: No discharge.        Left eye: No discharge.     Extraocular Movements: Extraocular movements intact.     Conjunctiva/sclera: Conjunctivae normal.     Pupils: Pupils are equal, round, and reactive to light.  Cardiovascular:     Rate and Rhythm: Normal rate and regular  rhythm.     Pulses: Normal pulses.     Heart sounds: Normal heart sounds. No murmur heard. No friction rub. No gallop.   Pulmonary:     Effort: Pulmonary effort is normal. No respiratory distress.     Breath sounds: Normal breath sounds.  Skin:    General: Skin is warm and dry.     Capillary Refill: Capillary refill takes less than 2 seconds.  Neurological:     General: No focal deficit present.     Mental Status: She is alert and oriented to person, place, and time.  Psychiatric:        Attention and Perception: Attention and perception normal.        Mood and Affect: Mood and affect normal.        Speech: Speech normal.        Behavior: Behavior normal. Behavior is cooperative.        Thought Content: Thought content normal.        Cognition and Memory: Cognition and memory normal.        Judgment: Judgment normal.    Results for orders placed or performed in visit on 09/23/20  POCT glycosylated hemoglobin (Hb A1C)  Result Value Ref Range   Hemoglobin A1C 5.8 (A) 4.0 - 5.6 %   HbA1c POC (<> result, manual entry)     HbA1c, POC (prediabetic range)     HbA1c, POC (controlled diabetic range)        Assessment & Plan:   Problem List Items Addressed This Visit      Endocrine   Diabetes mellitus type 2, controlled, with complications ([place]) - Primary    ControlledDM with A1c 5.8% improved from 6.1% and goal A1c < 7.0%.  Currently taking metformin XR 500mg  BID WC and tolerating well.  Plan:  1. Continue current therapy: metformin XR 500mg  BID WC 2. Encourage improved lifestyle: - low carb/low glycemic diet reinforced prior education - Increase physical activity to 30 minutes most days of the week.  Explained that increased physical activity increases body's use of sugar for energy. 3. Check fasting am CBG and log these.  Bring log to next visit for review 4. Continue ASA, ACEi and Statin 5. Advised to schedule DM ophtho exam, send record. 6. Follow-up in 6 months       Relevant Orders   POCT glycosylated hemoglobin (Hb A1C) (Completed)     Other   Loss of taste    Loss of taste x < 1 week.  Discussed possibility of COVID based on symptom, no other symptoms.  Will  swab for COVID and send to lab for testing.      Relevant Orders   Novel Coronavirus, NAA ([facility])      No orders of the defined types were placed in this encounter.  Follow up plan: Return for DM, A1C F/U.   Charlaine Dalton, FNP Family Nurse Practitioner [name] 09/23/2020, 11:53 AM

## 2020-09-23 NOTE — Patient Instructions (Signed)
We have taken a COVID swab based on your symptom of loss of taste for the past week and sent to the lab for testing.  We will contact you with the results.  Continue on all of your medications as prescribed  You can learn more information online about your diabetes at American Diabetes Association: http://www.diabetes.org/ - General self-care (diet, medications, blood sugar checks). - Diet recommendations - There are even recipes available for you to look at and try.  We will plan to see you back in 6 months for diabetes follow up visit  You will receive a survey after today's visit either digitally by e-mail or paper by USPS mail. Your experiences and feedback matter to [place].  Please respond so we know how we are doing as we provide care for you.  Call us with any questions/concerns/needs.  It is my goal to be available to you for your health concerns.  Thanks for choosing me to be a partner in your healthcare needs!  Charlaine Dalton, FNP-C Family Nurse Practitioner [facility] Phone: [phone]

## 2020-09-23 NOTE — Assessment & Plan Note (Signed)
ControlledDM with A1c 5.8% improved from 6.1% and goal A1c < 7.0%.  Currently taking metformin XR 500mg  BID WC and tolerating well.  Plan:  1. Continue current therapy: metformin XR 500mg  BID WC 2. Encourage improved lifestyle: - low carb/low glycemic diet reinforced prior education - Increase physical activity to 30 minutes most days of the week.  Explained that increased physical activity increases body's use of sugar for energy. 3. Check fasting am CBG and log these.  Bring log to next visit for review 4. Continue ASA, ACEi and Statin 5. Advised to schedule DM ophtho exam, send record. 6. Follow-up in 6 months

## 2020-09-24 ENCOUNTER — Ambulatory Visit: Payer: Self-pay | Admitting: Family Medicine

## 2020-09-27 LAB — NOVEL CORONAVIRUS, NAA: SARS-CoV-2, NAA: NOT DETECTED

## 2020-09-27 LAB — SPECIMEN STATUS REPORT

## 2020-09-28 ENCOUNTER — Other Ambulatory Visit: Payer: Self-pay

## 2020-09-28 ENCOUNTER — Ambulatory Visit (INDEPENDENT_AMBULATORY_CARE_PROVIDER_SITE_OTHER): Payer: Medicare Other | Admitting: Podiatry

## 2020-09-28 DIAGNOSIS — L84 Corns and callosities: Secondary | ICD-10-CM | POA: Diagnosis not present

## 2020-09-28 DIAGNOSIS — M2041 Other hammer toe(s) (acquired), right foot: Secondary | ICD-10-CM

## 2020-09-28 DIAGNOSIS — E0843 Diabetes mellitus due to underlying condition with diabetic autonomic (poly)neuropathy: Secondary | ICD-10-CM | POA: Diagnosis not present

## 2020-09-28 DIAGNOSIS — M79674 Pain in right toe(s): Secondary | ICD-10-CM | POA: Diagnosis not present

## 2020-09-28 DIAGNOSIS — M2042 Other hammer toe(s) (acquired), left foot: Secondary | ICD-10-CM | POA: Diagnosis not present

## 2020-09-28 DIAGNOSIS — M79675 Pain in left toe(s): Secondary | ICD-10-CM

## 2020-09-28 DIAGNOSIS — B351 Tinea unguium: Secondary | ICD-10-CM

## 2020-09-28 NOTE — Progress Notes (Signed)
   SUBJECTIVE Patient with a history of diabetes mellitus presents to office today complaining of elongated, thickened nails that cause pain while ambulating in shoes.  She is unable to trim her own nails.  Patient does have history of total permanent nail avulsions to bilateral great toes which have healed uneventfully.  She is doing very well.  Patient is here for further evaluation and treatment.   Past Medical History:  Diagnosis Date  . Asthma   . Back pain   . Cataract   . Cervical disc disorder   . COPD (chronic obstructive pulmonary disease) ([place])   . Depression   . Diabetes ([place])    type II  . Diabetic neuropathy ([place])   . Dyspnea    with exertion  . GERD (gastroesophageal reflux disease)   . History of kidney stones    per patient "can't remember the year"  . Hyperlipidemia   . Hypertension    per patient "take meds for it"  . Insomnia   . Neuropathy   . Osteoarthritis   . Osteopenia   . Pneumonia 2018   per patient  . Reflux   . Rheumatoid arthritis ([place])   . Stroke ([place]) 2015   and again 2017  - Weakness in left leg, staggers w/ walking, vision   . Tenosynovitis     OBJECTIVE General Patient is awake, alert, and oriented x 3 and in no acute distress. Derm Skin is dry and supple bilateral. Negative open lesions or macerations. Remaining integument unremarkable. Nails are tender, long, thickened and dystrophic with subungual debris, consistent with onychomycosis, 2-5 bilateral. No signs of infection noted. Vasc  DP and PT pedal pulses palpable bilaterally. Temperature gradient within normal limits.  Neuro Epicritic and protective threshold sensation diminished bilaterally.  Musculoskeletal Exam No symptomatic pedal deformities noted bilateral. Muscular strength within normal limits.  Hammertoe contracture deformity noted 2-5 bilateral  ASSESSMENT 1. Diabetes Mellitus w/ peripheral neuropathy 2. Onychomycosis of nail due to dermatophyte bilateral 3.   Preulcerative callus lesions bilateral feet 4.  Pain in foot bilateral 5.  Hammertoes bilateral feet  PLAN OF CARE 1. Patient evaluated today. 2. Instructed to maintain good pedal hygiene and foot care. Stressed importance of controlling blood sugar.  3. Mechanical debridement of nails 1-5 bilaterally performed using a nail nipper. Filed with dremel without incident.  4.  Excisional debridement of the preulcerative callus tissue was performed using a tissue nipper without incident or bleeding  5.  Appointment with Pedorthist for diabetic shoes and insoles  6.  Return to clinic in 3 mos.     [name], DPM [facility]  Dr. [name], [address]                                        [place], [place] [address]                Office [phone]  Fax [phone]

## 2020-09-30 ENCOUNTER — Telehealth: Payer: Self-pay | Admitting: General Practice

## 2020-09-30 ENCOUNTER — Ambulatory Visit: Payer: Self-pay | Admitting: General Practice

## 2020-09-30 DIAGNOSIS — I10 Essential (primary) hypertension: Secondary | ICD-10-CM

## 2020-09-30 DIAGNOSIS — E785 Hyperlipidemia, unspecified: Secondary | ICD-10-CM

## 2020-09-30 DIAGNOSIS — E1169 Type 2 diabetes mellitus with other specified complication: Secondary | ICD-10-CM

## 2020-09-30 NOTE — Patient Instructions (Signed)
Visit Information  Patient Care Plan: General Social Work (Adult)    Problem Identified: Coping Skills (General Plan of Care)     Goal: Coping Skills Enhanced   Start Date: 08/24/2020  This Visit's Progress: On track  Note:   Evidence-based guidance:   Acknowledge, normalize and validate difficulty of making life-long lifestyle changes.   Identify current effective and ineffective coping strategies.   Encourage patient and caregiver participation in care to increase self-esteem, confidence and feelings of control.   Consider alternative and complementary therapy approaches such as meditation, mindfulness or yoga.   Encourage participation in cognitive behavioral therapy to foster a positive identity, increase self-awareness, as well as bolster self-esteem, confidence and self-efficacy.   Discuss spirituality; be present as concerns are identified; encourage journaling, prayer, worship services, meditation or pastoral counseling.   Encourage participation in pleasurable group activities such as hobbies, singing, sports or volunteering).   Encourage the use of mindfulness; refer for training or intensive intervention.   Consider the use of meditative movement therapy such as tai chi, yoga or qigong.   Promote a regular daily exercise program based on tolerance, ability and patient choice to support positive thinking about disease or aging.   Notes:   Current Barriers:  . Financial constraints . Limited social support . ADL IADL limitations . Mental Health Concerns  . Social Isolation . Limited access to caregiver . Inability to perform ADL's independently . Inability to perform IADL's independently . Lacks knowledge of community resource: grief support resources within the area  Interventions: . Patient interviewed and appropriate assessments performed . Education provided on how to implement appropriate self-care and coping tools into her daily routine to combat negative  thinking and depressive episodes. . Patient reports that her feet continue to swell even though she elevates them on a pillow regularly. Patient reports that her right toe is red and irritated. Patient shares that she put some pain relief ointment on it last night which helped. . Patient is experiencing increase stress at this time because she has to relocate out of her daughter in law's house. She provided a move out date to CCM RNCM but patient reports that her daughter in law will hopefully allow her to stay at residence until she secures another source of housing. Patient has been active about researching low income apartments but is having trouble finding anything within her budget. Patient also does not have a source of stable transportation to go look at potential housing options. [facility] referral placed on 08/24/20 for low income housing.  . Provided mental health counseling with regard to current stressors. LCSW used active and reflective listening and implemented appropriate interventions to help suppport patient and her emotional needs. Advised patient to implement deep breathing/grounding/meditation/self-care exercises into her daily routine to combat stressors. Education provided.  . LCSW was informed that patient's sleep hygiene and routine is the same. She reports ongoing difficulty with staying asleep at night which affects her energy, mobility and motivation. Patient reports interest in gaining medication assistance for this concern. Patient ask for [facility] office number so that she can set up appointment with PCP to discuss. LCSW provided education on healthy sleep hygiene and what that looks like. LCSW encouraged patient to implement a night time routine into her schedule that works best for her and that she is able to maintain. Advised patient to implement deep breathing/grounding/meditation/self-care exercises into her nightly routine to combat racing thoughts at night.  . LCSW reviewed upcoming  appointments with patient.   She is agreeable to contact [employer] to set up transportation arrangements for future appointments.  . Provided patient with information about grief support resources within her area. Patient denies needing this support at this time but was appreciative of education that was provided.  . Discussed plans with patient for ongoing care management follow up and provided patient with direct contact information for care management team . Patient reports that she has been managing her blood pressure very well and is proud of this accomplishment. LCSW provided education on healthy self-care and provided positive reinforcement for her taking action to improve her overall health and symptoms.  . Assisted patient/caregiver with obtaining information about health plan benefits . LCSW used active and reflective listening and implemented appropriate interventions to help suppport patient and her emotional needs.  Patient Self Care Activities:  . Attends all scheduled provider appointments . Calls provider office for new concerns or questions  Please see past updates related to this goal by clicking on the "Past Updates" button in the selected goal      Task: Support Psychosocial Response to Risk or Actual Health Condition   Note:   Care Management Activities:    - active listening utilized - caregiver stress acknowledged - counseling provided - current coping strategies identified - decision-making supported - healthy lifestyle promoted - journaling promoted - meditative movement therapy encouraged - mindfulness encouraged - participation in counseling encouraged - problem-solving facilitated - relaxation techniques promoted - self-reflection promoted - spiritual activities promoted - verbalization of feelings encouraged    Notes:    Patient Care Plan: RNCM: Hypertension (Adult)    Problem Identified: RNCM: Hypertension (Hypertension)   Priority: Medium    Goal:  RNCM: Hypertension Monitored   Priority: Medium  Note:   Objective:  . Last practice recorded BP readings:  BP Readings from Last 3 Encounters:  09/30/20 119/83  09/23/20 112/69  09/02/20 139/78 .   . Most recent eGFR/CrCl: No results found for: EGFR  No components found for: CRCL Current Barriers:  . Knowledge Deficits related to basic understanding of hypertension pathophysiology and self care management . Knowledge Deficits related to understanding of medications prescribed for management of hypertension . Limited Social Support . Unable to independently manage HTN . Does not contact provider office for questions/concerns Case Manager Clinical Goal(s):  . Over the next 120 days, patient will verbalize understanding of plan for hypertension management . Over the next 120 days, patient will demonstrate improved adherence to prescribed treatment plan for hypertension as evidenced by taking all medications as prescribed, monitoring and recording blood pressure as directed, adhering to low sodium/DASH diet . Over the next 120 days, patient will demonstrate improved health management independence as evidenced by checking blood pressure as directed and notifying PCP if SBP>160 or DBP > 90, taking all medications as prescribe, and adhering to a low sodium diet as discussed. . Over the next 120 days, patient will verbalize basic understanding of hypertension disease process and self health management plan as evidenced by adherence to heart healthy diet, medication compliance, working with CCM team to meet health and wellness needs  Interventions:  . Collaboration with Malfi, Nicole M, FNP regarding development and update of comprehensive plan of care as evidenced by provider attestation and co-signature . Inter-disciplinary care team collaboration (see longitudinal plan of care) . Evaluation of current treatment plan related to hypertension self management and patient's adherence to plan as  established by provider. . Provided education to patient re: stroke prevention, s/s of   heart attack and stroke, DASH diet, complications of uncontrolled blood pressure . Reviewed medications with patient and discussed importance of compliance . Discussed plans with patient for ongoing care management follow up and provided patient with direct contact information for care management team . Advised patient, providing education and rationale, to monitor blood pressure daily and record, calling PCP for findings outside established parameters.  Patient Goals/Self-Care Activities . Over the next 120 days, patient will:  - Self administers medications as prescribed Attends all scheduled provider appointments Calls provider office for new concerns, questions, or BP outside discussed parameters Checks BP and records as discussed Follows a low sodium diet/DASH diet - blood pressure equipment and technique reviewed - blood pressure trends reviewed - depression screen reviewed - home or ambulatory blood pressure monitoring encouraged  Follow Up Plan: Telephone follow up appointment with care management team member scheduled for: 11-25-2020 at 11:45 am   Task: RNCM: Identify and Monitor Blood Pressure Elevation   Note:   Care Management Activities:    - blood pressure equipment and technique reviewed - blood pressure trends reviewed - depression screen reviewed - home or ambulatory blood pressure monitoring encouraged       Patient Care Plan: RNCM: HLD Management    Problem Identified: RNCM: Health Promotion or Disease Self-Management (General Plan of Care)   Priority: Medium    Goal: RNCM: HLD Self-Management Plan Developed   Priority: Medium  Note:   Current Barriers:  . Poorly controlled hyperlipidemia, complicated by HTN, smoker, COPd . Current antihyperlipidemic regimen: Lipitor 74m daily . Most recent lipid panel:     Component Value Date/Time   CHOL 114 06/14/2020 0929   CHOL  177 06/07/2015 1215   CHOL 141 09/02/2014 0440   TRIG 82 06/14/2020 0929   TRIG 155 09/02/2014 0440   HDL 60 06/14/2020 0929   HDL 56 06/07/2015 1215   HDL 37 (L) 09/02/2014 0440   CHOLHDL 1.9 06/14/2020 0929   VLDL 16 10/17/2019 0606   VLDL 31 09/02/2014 0440   LDLCALC 38 06/14/2020 0929   LDLCALC 73 09/02/2014 0440 .   .[name] KitchenASCVD risk enhancing conditions: age >>55 pre- DM, HTN,  current smoker . Unable to independently manage HLD  . Lacks social connections . Does not contact provider office for questions/concerns  RN Care Manager Clinical Goal(s):  .[name] KitchenOver the next 120 days, patient will work with RConsulting civil engineer providers, and care team towards execution of optimized self-health management plan . Over the next 120 days, patient will verbalize understanding of plan for effective management of HLD . Over the next 120 days, [facility] to address needs related to changes in condition related to HLD . Over the next 120 days, patient will demonstrate improved adherence to prescribed treatment plan for HLD as evidenced byadherence to heart healthy diet, medication compliance and working with CCM team to manage HLD and other chronic conditions.   Interventions: . Collaboration with Malfi, NLupita Raider FNP regarding development and update of comprehensive plan of care as evidenced by provider attestation and co-signature . Inter-disciplinary care team collaboration (see longitudinal plan of care) . Medication review performed; medication list updated in electronic medical record.  .[facility] team collaboration (see longitudinal plan of care) . Referred to pharmacy team for assistance with HLD medication management . Evaluation of current treatment plan related to HLD and patient's adherence to plan as established by provider. . Advised patient to call the office for changes  in condition or questions concerning chronic conditions  . Provided education to  patient re: adherence to heart healthy/ADA diet, praised for monitoring blood pressures and supplying readings during outreaches  . Discussed plans with patient for ongoing care management follow up and provided patient with direct contact information for care management team   Patient Goals/Self-Care Activities: . Over the next 120 days, patient will:   - call for medicine refill 2 or 3 days before it runs out - call if I am sick and can't take my medicine - keep a list of all the medicines I take; vitamins and herbals too - learn to read medicine labels - use a pillbox to sort medicine - use an alarm clock or phone to remind me to take my medicine - drink 6 to 8 glasses of water each day - eat 3 to 5 servings of fruits and vegetables each day - eat 5 or 6 small meals each day - fill half the plate with nonstarchy vegetables - limit fast food meals to no more than 1 per week - manage portion size - prepare main meal at home 3 to 5 days each week - read food labels for fat, fiber, carbohydrates and portion size - be open to making changes - I can manage, know and watch for signs of a heart attack - if I have chest pain, call for help - learn about small changes that will make a big difference - learn my personal risk factors - barriers to meeting goals identified - change-talk evoked - choices provided - collaboration with team encouraged - decision-making supported - health risks reviewed - problem-solving facilitated - questions answered - readiness for change evaluated - reassurance provided - resources needed to meet goals identified - self-reflection promoted - self-reliance encouraged  Follow Up Plan: Telephone follow up appointment with care management team member scheduled for: 11-25-2020 at 11:45 am     Task: RNCM: Mutually Develop and Foster Achievement of Patient Goals   Note:   Care Management Activities:    - barriers to meeting goals identified -  change-talk evoked - choices provided - collaboration with team encouraged - decision-making supported - health risks reviewed - problem-solving facilitated - questions answered - readiness for change evaluated - reassurance provided - resources needed to meet goals identified - self-reflection promoted - self-reliance encouraged         The patient verbalized understanding of instructions, educational materials, and care plan provided today and declined offer to receive copy of patient instructions, educational materials, and care plan.   Telephone follow up appointment with care management team member scheduled for: 11-25-2020 at 11:45 am  Pam Tate RN, MSN, CCM Community Care Coordinator [place]  [facility] Mobile: [phone]  

## 2020-09-30 NOTE — Chronic Care Management (AMB) (Signed)
Chronic Care Management   CCM RN Visit Note  09/30/2020 Name: Kendra [name] MRN: [id number] DOB: [birth year]  Subjective: Kendra [name] is a 75 y.o. year old female who is a primary care patient of Kendra Bangs, FNP. The care management team was consulted for assistance with disease management and care coordination needs.    Engaged with patient by telephone for follow up visit in response to provider referral for case management and/or care coordination services.   Consent to Services:  Patient already engaged with CCM program   Patient agreed to services and verbal consent obtained.   Assessment: Review of patient past medical history, allergies, medications, health status, including review of consultants reports, laboratory and other test data, was performed as part of comprehensive evaluation and provision of chronic care management services.   SDOH (Social Determinants of Health) assessments and interventions performed:  SDOH Interventions   Flowsheet Row Most Recent Value  SDOH Interventions   Physical Activity Interventions Other (Comments)   [no structured activity]  Social Connections Interventions Other (Comment)  [good family support]       CCM Care Plan  Allergies  Allergen Reactions   Citalopram Nausea And Vomiting    Outpatient Encounter Medications as of 09/30/2020  Medication Sig   albuterol (VENTOLIN HFA) 108 (90 Base) MCG/ACT inhaler INHALE 1-2 PUFFS EVERY 6 HOURS AS NEEDEDSHORTNESS OF BREATH AND WHEEZING   aspirin 325 MG tablet Take 325 mg by mouth daily.   atorvastatin (LIPITOR) 40 MG tablet TAKE 1 TABLET BY MOUTH ONCE DAILY   Blood Glucose Monitoring Suppl (ONETOUCH VERIO) w/Device KIT USE AS DIRECTED   buPROPion (WELLBUTRIN XL) 150 MG 24 hr tablet TAKE 1 TABLET BY MOUTH ONCE DAILY   clopidogrel (PLAVIX) 75 MG tablet TAKE 1 TABLET BY MOUTH ONCE DAILY   cyclobenzaprine (FLEXERIL) 5 MG tablet Take 0.5-1 tablets (2.5-5 mg total) by mouth 3  (three) times daily as needed for muscle spasms.   glucose blood (ONETOUCH VERIO) test strip Use as instructed   Lancet Devices (ONE TOUCH DELICA LANCING DEV) MISC 1 Device by Does not apply route 2 (two) times daily.   lisinopril (ZESTRIL) 5 MG tablet Take 1 tablet (5 mg total) by mouth daily.   metFORMIN (GLUCOPHAGE-XR) 500 MG 24 hr tablet TAKE 1 TABLET BY MOUTH TWICE DAILY BEFORE A MEAL   nicotine polacrilex (COMMIT) 4 MG lozenge Take 4 mg by mouth as needed for smoking cessation.   ondansetron (ZOFRAN) 4 MG tablet Take 2 tablets (8 mg total) by mouth every 8 (eight) hours as needed for nausea or vomiting. (Patient not taking: Reported on 10/25/7822)   OneTouch Delica Lancets 23N MISC 1 Device by Does not apply route 2 (two) times daily.   traZODone (DESYREL) 50 MG tablet Take 0.5-1 tablets (25-50 mg total) by mouth at bedtime as needed for sleep.   TRELEGY ELLIPTA 100-62.5-25 MCG/INH AEPB INHALE 1 PUFF INTO THE LUNGS ONCE DAILY   No facility-administered encounter medications on file as of 09/30/2020.    Patient Active Problem List   Diagnosis Date Noted   Loss of taste 09/23/2020   Vitamin B12 deficiency 08/20/2020   Personal history of tobacco use, presenting hazards to health 08/20/2020   Normocytic anemia 06/15/2020   Colon cancer screening 05/25/2020   Right hand pain 01/29/2020   Visit for suture removal 01/23/2020   Encounter for screening colonoscopy 01/23/2020   Carotid artery stenosis 12/30/2019   Stenosis of intracranial portions of left internal carotid artery  10/17/2019   Weakness of left upper extremity 10/16/2019   Left-sided weakness 10/16/2019   Hypertension associated with diabetes ([place]) 12/19/2018   Chronic midline low back pain without sciatica 05/07/2018   Radiculopathy of leg 04/01/2017   Calcification of aorta ([place]) 09/27/2016   Early satiety 07/26/2016   CMC arthritis, thumb, degenerative 07/16/2015   Weight loss 06/07/2015    Tenosynovitis of wrist 06/07/2015   Protein calorie malnutrition ([place]) 06/07/2015   Diabetes mellitus type 2, controlled, with complications ([place]) 00/93/8182   Cataract 03/30/2015   Other and unspecified disc disorder of cervical region 03/30/2015   Insomnia 03/30/2015   Centrilobular emphysema ([place]) 03/30/2015   Clinical depression 03/30/2015   Hyperlipidemia associated with type 2 diabetes mellitus ([place]) 03/30/2015   Gastro-esophageal reflux disease without esophagitis 03/30/2015   Increased thickness of nail 03/30/2015   History of cerebrovascular accident with residual deficit 03/30/2015   Osteoarthritis 03/30/2015   Osteopenia 03/30/2015   Allergic rhinitis 03/30/2015   B12 deficiency 03/30/2015   Tobacco use disorder 03/30/2015   Vitamin D deficiency 03/30/2015   Mycotic toenails 08/12/2014   Diabetic peripheral neuropathy associated with type 2 diabetes mellitus ([place]) 08/12/2014    Conditions to be addressed/monitored:HTN and HLD  Care Plan : RNCM: Hypertension (Adult)  Updates made by Vanita Ingles since 09/30/2020 12:00 AM    Problem: RNCM: Hypertension (Hypertension)   Priority: Medium    Goal: RNCM: Hypertension Monitored   Priority: Medium  Note:   Objective:   Last practice recorded BP readings:  BP Readings from Last 3 Encounters:  09/30/20 119/83  09/23/20 112/69  09/02/20 139/78     Most recent eGFR/CrCl: No results found for: EGFR  No components found for: CRCL Current Barriers:   Knowledge Deficits related to basic understanding of hypertension pathophysiology and self care management  Knowledge Deficits related to understanding of medications prescribed for management of hypertension  Limited Social Support  Unable to independently manage HTN  Does not contact provider office for questions/concerns Case Manager Clinical Goal(s):   Over the next 120 days, patient will verbalize understanding of plan for hypertension  management  Over the next 120 days, patient will demonstrate improved adherence to prescribed treatment plan for hypertension as evidenced by taking all medications as prescribed, monitoring and recording blood pressure as directed, adhering to low sodium/DASH diet  Over the next 120 days, patient will demonstrate improved health management independence as evidenced by checking blood pressure as directed and notifying PCP if SBP>160 or DBP > 90, taking all medications as prescribe, and adhering to a low sodium diet as discussed.  Over the next 120 days, patient will verbalize basic understanding of hypertension disease process and self health management plan as evidenced by adherence to heart healthy diet, medication compliance, working with CCM team to meet health and wellness needs  Interventions:   Collaboration with Malfi, [name], FNP regarding development and update of comprehensive plan of care as evidenced by provider attestation and co-signature  Inter-disciplinary care team collaboration (see longitudinal plan of care)  Evaluation of current treatment plan related to hypertension self management and patient's adherence to plan as established by provider.  Provided education to patient re: stroke prevention, s/s of heart attack and stroke, DASH diet, complications of uncontrolled blood pressure  Reviewed medications with patient and discussed importance of compliance  Discussed plans with patient for ongoing care management follow up and provided patient with direct contact information for care management team  Advised patient, providing education  and rationale, to monitor blood pressure daily and record, calling PCP for findings outside established parameters.  Patient Goals/Self-Care Activities  Over the next 120 days, patient will:  - Self administers medications as prescribed Attends all scheduled provider appointments Calls provider office for new concerns, questions, or BP  outside discussed parameters Checks BP and records as discussed Follows a low sodium diet/DASH diet - blood pressure equipment and technique reviewed - blood pressure trends reviewed - depression screen reviewed - home or ambulatory blood pressure monitoring encouraged  Follow Up Plan: Telephone follow up appointment with care management team member scheduled for: 11-25-2020 at 11:45 am   Task: RNCM: Identify and Monitor Blood Pressure Elevation   Note:   Care Management Activities:    - blood pressure equipment and technique reviewed - blood pressure trends reviewed - depression screen reviewed - home or ambulatory blood pressure monitoring encouraged       Care Plan : RNCM: HLD Management  Updates made by Vanita Ingles since 09/30/2020 12:00 AM    Problem: RNCM: Health Promotion or Disease Self-Management (General Plan of Care)   Priority: Medium    Goal: RNCM: HLD Self-Management Plan Developed   Priority: Medium  Note:   Current Barriers:   Poorly controlled hyperlipidemia, complicated by HTN, smoker, COPd  Current antihyperlipidemic regimen: Lipitor $RemoveBeforeDE'40mg'[id number]$  daily  Most recent lipid panel:     Component Value Date/Time   CHOL 114 06/14/2020 0929   CHOL 177 06/07/2015 1215   CHOL 141 09/02/2014 0440   TRIG 82 06/14/2020 0929   TRIG 155 09/02/2014 0440   HDL 60 06/14/2020 0929   HDL 56 06/07/2015 1215   HDL 37 (L) 09/02/2014 0440   CHOLHDL 1.9 06/14/2020 0929   VLDL 16 10/17/2019 0606   VLDL 31 09/02/2014 0440   LDLCALC 38 06/14/2020 0929   LDLCALC 73 09/02/2014 0440     ASCVD risk enhancing conditions: age >38, pre- DM, HTN,  current smoker  Unable to independently manage HLD   Lacks social connections  Does not contact provider office for questions/concerns  RN Care Manager Clinical Goal(s):   Over the next 120 days, patient will work with Consulting civil engineer, providers, and care team towards execution of optimized self-health management plan  Over the  next 120 days, patient will verbalize understanding of plan for effective management of HLD  Over the next 120 days, patient will work with RNCM and pcp to address needs related to changes in condition related to HLD  Over the next 120 days, patient will demonstrate improved adherence to prescribed treatment plan for HLD as evidenced byadherence to heart healthy diet, medication compliance and working with CCM team to manage HLD and other chronic conditions.   Interventions:  Collaboration with Malfi, [name], FNP regarding development and update of comprehensive plan of care as evidenced by provider attestation and co-signature  Inter-disciplinary care team collaboration (see longitudinal plan of care)  Medication review performed; medication list updated in electronic medical record.   Inter-disciplinary care team collaboration (see longitudinal plan of care)  Referred to pharmacy team for assistance with HLD medication management  Evaluation of current treatment plan related to HLD and patient's adherence to plan as established by provider.  Advised patient to call the office for changes in condition or questions concerning chronic conditions   Provided education to patient re: adherence to heart healthy/ADA diet, praised for monitoring blood pressures and supplying readings during outreaches   Discussed plans with patient for ongoing  care management follow up and provided patient with direct contact information for care management team   Patient Goals/Self-Care Activities:  Over the next 120 days, patient will:   - call for medicine refill 2 or 3 days before it runs out - call if I am sick and can't take my medicine - keep a list of all the medicines I take; vitamins and herbals too - learn to read medicine labels - use a pillbox to sort medicine - use an alarm clock or phone to remind me to take my medicine - drink 6 to 8 glasses of water each day - eat 3 to 5 servings of  fruits and vegetables each day - eat 5 or 6 small meals each day - fill half the plate with nonstarchy vegetables - limit fast food meals to no more than 1 per week - manage portion size - prepare main meal at home 3 to 5 days each week - read food labels for fat, fiber, carbohydrates and portion size - be open to making changes - I can manage, know and watch for signs of a heart attack - if I have chest pain, call for help - learn about small changes that will make a big difference - learn my personal risk factors - barriers to meeting goals identified - change-talk evoked - choices provided - collaboration with team encouraged - decision-making supported - health risks reviewed - problem-solving facilitated - questions answered - readiness for change evaluated - reassurance provided - resources needed to meet goals identified - self-reflection promoted - self-reliance encouraged  Follow Up Plan: Telephone follow up appointment with care management team member scheduled for: 11-25-2020 at 11:45 am     Task: RNCM: Mutually Develop and Royce Macadamia Achievement of Patient Goals   Note:   Care Management Activities:    - barriers to meeting goals identified - change-talk evoked - choices provided - collaboration with team encouraged - decision-making supported - health risks reviewed - problem-solving facilitated - questions answered - readiness for change evaluated - reassurance provided - resources needed to meet goals identified - self-reflection promoted - self-reliance encouraged         Plan:Telephone follow up appointment with care management team member scheduled for:  11-25-2020 at 11:45 am  [name] RN, MSN, [place] Mobile: [phone]

## 2020-10-01 ENCOUNTER — Inpatient Hospital Stay (HOSPITAL_BASED_OUTPATIENT_CLINIC_OR_DEPARTMENT_OTHER): Payer: Medicare Other | Admitting: Oncology

## 2020-10-01 ENCOUNTER — Encounter: Payer: Self-pay | Admitting: Oncology

## 2020-10-01 DIAGNOSIS — E538 Deficiency of other specified B group vitamins: Secondary | ICD-10-CM | POA: Diagnosis not present

## 2020-10-01 DIAGNOSIS — R803 Bence Jones proteinuria: Secondary | ICD-10-CM | POA: Diagnosis not present

## 2020-10-01 DIAGNOSIS — D649 Anemia, unspecified: Secondary | ICD-10-CM | POA: Diagnosis not present

## 2020-10-01 NOTE — Progress Notes (Signed)
HEMATOLOGY-ONCOLOGY TeleHEALTH VISIT PROGRESS NOTE  I connected with Kendra [name] on _0  @ at  2:00 PM EST by video enabled telemedicine visit and verified that I am speaking with the correct person using two identifiers. I discussed the limitations, risks, security and privacy concerns of performing an evaluation and management service by telemedicine and the availability of in-person appointments. The patient expressed understanding and agreed to proceed.   Other persons participating in the visit and their role in the encounter:  None  Patient's location: Home  Provider's location: office Chief Complaint: follow up for test results   INTERVAL HISTORY Kendra [name] is a 75 y.o. female who has above history reviewed by me today presents for follow up visit for  Anemia, follow up for results.  Problems and complaints are listed below:  No new complaints. I attempted to connect the patient for visual enabled telehealth visit.  Due to the technical difficulties with video,  Patient was transitioned to audio only visit.   Review of Systems  Constitutional: Positive for fatigue. Negative for appetite change, chills and fever.  HENT:   Negative for hearing loss and voice change.   Eyes: Negative for eye problems.  Respiratory: Negative for chest tightness and cough.   Cardiovascular: Negative for chest pain.  Gastrointestinal: Negative for abdominal distention, abdominal pain and blood in stool.  Endocrine: Negative for hot flashes.  Genitourinary: Negative for difficulty urinating and frequency.   Musculoskeletal: Negative for arthralgias.  Skin: Negative for itching and rash.  Neurological: Negative for extremity weakness.  Hematological: Negative for adenopathy.  Psychiatric/Behavioral: Negative for confusion.    Past Medical History:  Diagnosis Date  . Asthma   . Back pain   . Cataract   . Cervical disc disorder   . COPD (chronic obstructive pulmonary disease)  ([place])   . Depression   . Diabetes ([place])    type II  . Diabetic neuropathy ([place])   . Dyspnea    with exertion  . GERD (gastroesophageal reflux disease)   . History of kidney stones    per patient "can't remember the year"  . Hyperlipidemia   . Hypertension    per patient "take meds for it"  . Insomnia   . Neuropathy   . Osteoarthritis   . Osteopenia   . Pneumonia 2018   per patient  . Reflux   . Rheumatoid arthritis ([place])   . Stroke ([place]) 2015   and again 2017  - Weakness in left leg, staggers w/ walking, vision   . Tenosynovitis    Past Surgical History:  Procedure Laterality Date  . BREAST BIOPSY Right    Fatty Tumor  . ENDARTERECTOMY Right 10/21/2019   Procedure: ENDARTERECTOMY CAROTID RIGHT;  Surgeon: [name], MD;  Location: [facility];  Service: Vascular;  Laterality: Right;  . ENDARTERECTOMY Left 12/30/2019   Procedure: LEFT CAROTID ENDARTERECTOMY;  Surgeon: [name], MD;  Location: [facility];  Service: Vascular;  Laterality: Left;  . NECK SURGERY    . OVARIAN CYST REMOVAL    . PATCH ANGIOPLASTY Right 10/21/2019   Procedure: Patch Angioplasty Using XenoSure Biologic Patch Right Carotid;  Surgeon: [name], MD;  Location: [facility];  Service: Vascular;  Laterality: Right;  . PATCH ANGIOPLASTY Left 12/30/2019   Procedure: Patch Angioplasty Left Carotid;  Surgeon: [name], MD;  Location[facility];  Service: Vascular;  Laterality: Left;    Family History  Problem Relation Age of Onset  . Stroke  Father   . Depression Father   . Diabetes Mother   . Hyperlipidemia Mother   . Breast cancer Mother   . Heart murmur Son   . Heart murmur Son   . Heart murmur Son     Social History   Socioeconomic History  . Marital status: Widowed    Spouse name: Not on file  . Number of children: Not on file  . Years of education: Not on file  . Highest education level: Not on file  Occupational History  . Not on file  Tobacco Use   . Smoking status: Current Some Day Smoker    Packs/day: 0.50    Years: 61.00    Pack years: 30.50    Types: Cigarettes  . Smokeless tobacco: Former Systems developer    Types: Snuff  . Tobacco comment: .5 of 1 cigarette a day  Vaping Use  . Vaping Use: Never used  Substance and Sexual Activity  . Alcohol use: No  . Drug use: No  . Sexual activity: Never  Other Topics Concern  . Not on file  Social History Narrative  . Not on file   Social Determinants of Health   Financial Resource Strain: Low Risk   . Difficulty of Paying Living Expenses: Not very hard  Food Insecurity: No Food Insecurity  . Worried About Charity fundraiser in the Last Year: Never true  . Ran Out of Food in the Last Year: Never true  Transportation Needs: No Transportation Needs  . Lack of Transportation (Medical): No  . Lack of Transportation (Non-Medical): No  Physical Activity: Inactive  . Days of Exercise per Week: 0 days  . Minutes of Exercise per Session: 0 min  Stress: No Stress Concern Present  . Feeling of Stress : Only a little  Social Connections: Socially Isolated  . Frequency of Communication with Friends and Family: More than three times a week  . Frequency of Social Gatherings with Friends and Family: More than three times a week  . Attends Religious Services: Never  . Active Member of Clubs or Organizations: No  . Attends Archivist Meetings: Never  . Marital Status: Widowed  Intimate Partner Violence: Not At Risk  . Fear of Current or Ex-Partner: No  . Emotionally Abused: No  . Physically Abused: No  . Sexually Abused: No    Current Outpatient Medications on File Prior to Visit  Medication Sig Dispense Refill  . albuterol (VENTOLIN HFA) 108 (90 Base) MCG/ACT inhaler INHALE 1-2 PUFFS EVERY 6 HOURS AS NEEDEDSHORTNESS OF BREATH AND WHEEZING 8.5 g 1  . aspirin 325 MG tablet Take 325 mg by mouth daily.    [name] atorvastatin (LIPITOR) 40 MG tablet TAKE 1 TABLET BY MOUTH ONCE DAILY 90 tablet  0  . Blood Glucose Monitoring Suppl (ONETOUCH VERIO) w/Device KIT USE AS DIRECTED 1 kit 0  . buPROPion (WELLBUTRIN XL) 150 MG 24 hr tablet TAKE 1 TABLET BY MOUTH ONCE DAILY 90 tablet 0  . clopidogrel (PLAVIX) 75 MG tablet TAKE 1 TABLET BY MOUTH ONCE DAILY 90 tablet 1  . cyclobenzaprine (FLEXERIL) 5 MG tablet Take 0.5-1 tablets (2.5-5 mg total) by mouth 3 (three) times daily as needed for muscle spasms. 30 tablet 1  . glucose blood (ONETOUCH VERIO) test strip Use as instructed 200 each 4  . Lancet Devices (ONE TOUCH DELICA LANCING DEV) MISC 1 Device by Does not apply route 2 (two) times daily. 200 each 4  . lisinopril (ZESTRIL) 5 MG  tablet Take 1 tablet (5 mg total) by mouth daily. 90 tablet 3  . metFORMIN (GLUCOPHAGE-XR) 500 MG 24 hr tablet TAKE 1 TABLET BY MOUTH TWICE DAILY BEFORE A MEAL 60 tablet 0  . ondansetron (ZOFRAN) 4 MG tablet Take 2 tablets (8 mg total) by mouth every 8 (eight) hours as needed for nausea or vomiting. 20 tablet 0  . OneTouch Delica Lancets 35K MISC 1 Device by Does not apply route 2 (two) times daily. 200 each 4  . traZODone (DESYREL) 50 MG tablet Take 0.5-1 tablets (25-50 mg total) by mouth at bedtime as needed for sleep. 90 tablet 1  . TRELEGY ELLIPTA 100-62.5-25 MCG/INH AEPB INHALE 1 PUFF INTO THE LUNGS ONCE DAILY 60 each 1  . nicotine polacrilex (COMMIT) 4 MG lozenge Take 4 mg by mouth as needed for smoking cessation. (Patient not taking: Reported on 10/01/2020)     No current facility-administered medications on file prior to visit.    Allergies  Allergen Reactions  . Citalopram Nausea And Vomiting       Observations/Objective: Today's Vitals   10/01/20 1422  PainSc: 0-No pain   There is no height or weight on file to calculate BMI.  Physical Exam  CBC    Component Value Date/Time   WBC 4.6 07/20/2020 1018   RBC 3.69 (L) 07/20/2020 1018   RBC 3.67 (L) 07/20/2020 1018   HGB 11.4 (L) 07/20/2020 1018   HGB 12.7 09/01/2014 2130   HCT 34.4 (L)  07/20/2020 1018   HCT 37.9 09/01/2014 2130   PLT 238 07/20/2020 1018   PLT 260 09/05/2014 0517   MCV 93.7 07/20/2020 1018   MCV 92 09/01/2014 2130   MCH 31.1 07/20/2020 1018   MCHC 33.1 07/20/2020 1018   RDW 13.4 07/20/2020 1018   RDW 13.9 09/01/2014 2130   LYMPHSABS 1.5 07/20/2020 1018   MONOABS 0.3 07/20/2020 1018   EOSABS 0.1 07/20/2020 1018   BASOSABS 0.0 07/20/2020 1018    CMP     Component Value Date/Time   NA 139 07/20/2020 1018   NA 141 01/18/2016 1150   NA 138 09/01/2014 2130   K 3.8 07/20/2020 1018   K 3.5 09/01/2014 2130   CL 102 07/20/2020 1018   CL 104 09/01/2014 2130   CO2 28 07/20/2020 1018   CO2 28 09/01/2014 2130   GLUCOSE 118 (H) 07/20/2020 1018   GLUCOSE 287 (H) 09/01/2014 2130   BUN 16 07/20/2020 1018   BUN 12 01/18/2016 1150   BUN 14 09/01/2014 2130   CREATININE 0.66 07/20/2020 1018   CREATININE 0.83 06/14/2020 0929   CALCIUM 9.5 07/20/2020 1018   CALCIUM 8.9 09/01/2014 2130   PROT 7.5 07/20/2020 1018   PROT 7.1 01/18/2016 1150   PROT 7.2 09/01/2014 2130   ALBUMIN 4.4 07/20/2020 1018   ALBUMIN 4.6 01/18/2016 1150   ALBUMIN 3.5 09/01/2014 2130   AST 20 07/20/2020 1018   AST 20 09/01/2014 2130   ALT 13 07/20/2020 1018   ALT 24 09/01/2014 2130   ALKPHOS 67 07/20/2020 1018   ALKPHOS 112 09/01/2014 2130   BILITOT 0.4 07/20/2020 1018   BILITOT 0.3 01/18/2016 1150   BILITOT 0.2 09/01/2014 2130   GFRNONAA >60 07/20/2020 1018   GFRNONAA 70 06/14/2020 0929   GFRAA 82 06/14/2020 0929     Assessment and Plan: 1. Bence Jones protein present in urine   2. B12 deficiency   3. Normocytic anemia     Random UPEP showed Bence Jones protein.  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  Check 24 hour urine protein electrophoresis, immunofixation.  Observation for now.   B12 deficiency, on parental B12 monthly.  Follow Up Instructions: Keep her follow up plan in May.   I discussed the assessment and treatment  plan with the patient. The patient was provided an opportunity to ask questions and all were answered. The patient agreed with the plan and demonstrated an understanding of the instructions.  The patient was advised to call back or seek an in-person evaluation if the symptoms worsen or if the condition fails to improve as anticipated.     Earlie Server, MD 10/01/2020 10:18 PM

## 2020-10-04 ENCOUNTER — Telehealth: Payer: Self-pay

## 2020-10-04 NOTE — Chronic Care Management (AMB) (Signed)
  Care Management   Note  10/04/2020 Name: Kendra [name] MRN: [id number] DOB: 22-May-1946  [name] is a 75 y.o. year old female who is a primary care patient of [name], [name], [name] and is actively engaged with the care management team. I reached out to [name] by phone today to assist with re-scheduling a follow up visit with the Pharmacist  Follow up plan: Unsuccessful telephone outreach attempt made. A HIPAA compliant phone message was left for the patient providing contact information and requesting a return call.  The care management team will reach out to the patient again over the next 7 days.  If patient returns call to provider office, please advise to call [facility]  at [place], [place], [place], [place] [address] Direct Dial: [phone] [email or web address] Website: [email or web address]

## 2020-10-05 ENCOUNTER — Ambulatory Visit: Payer: Self-pay | Admitting: Licensed Clinical Social Worker

## 2020-10-05 DIAGNOSIS — E118 Type 2 diabetes mellitus with unspecified complications: Secondary | ICD-10-CM

## 2020-10-05 DIAGNOSIS — E1169 Type 2 diabetes mellitus with other specified complication: Secondary | ICD-10-CM

## 2020-10-05 DIAGNOSIS — F324 Major depressive disorder, single episode, in partial remission: Secondary | ICD-10-CM

## 2020-10-05 DIAGNOSIS — Z599 Problem related to housing and economic circumstances, unspecified: Secondary | ICD-10-CM

## 2020-10-05 DIAGNOSIS — E785 Hyperlipidemia, unspecified: Secondary | ICD-10-CM

## 2020-10-05 DIAGNOSIS — Z794 Long term (current) use of insulin: Secondary | ICD-10-CM

## 2020-10-05 DIAGNOSIS — Z59819 Housing instability, housed unspecified: Secondary | ICD-10-CM

## 2020-10-05 DIAGNOSIS — I10 Essential (primary) hypertension: Secondary | ICD-10-CM

## 2020-10-05 NOTE — Chronic Care Management (AMB) (Signed)
Chronic Care Management    Clinical Social Work Note  10/05/2020 Name: Kendra [name] MRN: [id number] DOB: [birth year]  Kendra [name] is a 75 y.o. year old female who is a primary care patient of [name], [name], FNP. The CCM team was consulted to assist the patient with chronic disease management and/or care coordination needs related to: Mental Health Counseling and Resources.   Engaged with patient by telephone for follow up visit in response to provider referral for social work chronic care management and care coordination services.   Consent to Services:  The patient was given the following information about Chronic Care Management services today, agreed to services, and gave verbal consent: 1. CCM service includes personalized support from designated clinical staff supervised by the primary care provider, including individualized plan of care and coordination with other care providers 2. 24/7 contact phone numbers for assistance for urgent and routine care needs. 3. Service will only be billed when office clinical staff spend 20 minutes or more in a month to coordinate care. 4. Only one practitioner may furnish and bill the service in a calendar month. 5.The patient may stop CCM services at any time (effective at the end of the month) by phone call to the office staff. 6. The patient will be responsible for cost sharing (co-pay) of up to 20% of the service fee (after annual deductible is met). Patient agreed to services and consent obtained.  Patient agreed to services and consent obtained.   Assessment: Review of patient past medical history, allergies, medications, and health status, including review of relevant consultants reports was performed today as part of a comprehensive evaluation and provision of chronic care management and care coordination services.     SDOH (Social Determinants of Health) assessments and interventions performed:    Advanced Directives Status: See Care Plan  for related entries.  CCM Care Plan  Allergies  Allergen Reactions  . Citalopram Nausea And Vomiting    Outpatient Encounter Medications as of 10/05/2020  Medication Sig  . albuterol (VENTOLIN HFA) 108 (90 Base) MCG/ACT inhaler INHALE 1-2 PUFFS EVERY 6 HOURS AS NEEDEDSHORTNESS OF BREATH AND WHEEZING  . aspirin 325 MG tablet Take 325 mg by mouth daily.  [name] atorvastatin (LIPITOR) 40 MG tablet TAKE 1 TABLET BY MOUTH ONCE DAILY  . Blood Glucose Monitoring Suppl (ONETOUCH VERIO) w/Device KIT USE AS DIRECTED  . buPROPion (WELLBUTRIN XL) 150 MG 24 hr tablet TAKE 1 TABLET BY MOUTH ONCE DAILY  . clopidogrel (PLAVIX) 75 MG tablet TAKE 1 TABLET BY MOUTH ONCE DAILY  . cyclobenzaprine (FLEXERIL) 5 MG tablet Take 0.5-1 tablets (2.5-5 mg total) by mouth 3 (three) times daily as needed for muscle spasms.  [name] glucose blood (ONETOUCH VERIO) test strip Use as instructed  . Lancet Devices (ONE TOUCH DELICA LANCING DEV) MISC 1 Device by Does not apply route 2 (two) times daily.  [name] lisinopril (ZESTRIL) 5 MG tablet Take 1 tablet (5 mg total) by mouth daily.  . metFORMIN (GLUCOPHAGE-XR) 500 MG 24 hr tablet TAKE 1 TABLET BY MOUTH TWICE DAILY BEFORE A MEAL  . nicotine polacrilex (COMMIT) 4 MG lozenge Take 4 mg by mouth as needed for smoking cessation. (Patient not taking: Reported on 10/01/2020)  . ondansetron (ZOFRAN) 4 MG tablet Take 2 tablets (8 mg total) by mouth every 8 (eight) hours as needed for nausea or vomiting.  [name] Lancets 12Y MISC 1 Device by Does not apply route 2 (two) times daily.  . traZODone (DESYREL)  50 MG tablet Take 0.5-1 tablets (25-50 mg total) by mouth at bedtime as needed for sleep.  . TRELEGY ELLIPTA 100-62.5-25 MCG/INH AEPB INHALE 1 PUFF INTO THE LUNGS ONCE DAILY   No facility-administered encounter medications on file as of 10/05/2020.    Patient Active Problem List   Diagnosis Date Noted  . Loss of taste 09/23/2020  . Vitamin B12 deficiency 08/20/2020  . Personal  history of tobacco use, presenting hazards to health 08/20/2020  . Normocytic anemia 06/15/2020  . Colon cancer screening 05/25/2020  . Right hand pain 01/29/2020  . Visit for suture removal 01/23/2020  . Encounter for screening colonoscopy 01/23/2020  . Carotid artery stenosis 12/30/2019  . 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 Weakness of left upper extremity 10/16/2019  . Left-sided weakness 10/16/2019  . Hypertension associated with diabetes ([place]) 12/19/2018  . Chronic midline low back pain without sciatica 05/07/2018  . Radiculopathy of leg 04/01/2017  . Calcification of aorta ([facility]) 09/27/2016  . Early satiety 07/26/2016  . CMC arthritis, thumb, degenerative 07/16/2015  . Weight loss 06/07/2015  . Tenosynovitis of wrist 06/07/2015  . Protein calorie malnutrition ([facility]) 06/07/2015  . Diabetes mellitus type 2, controlled, with complications ([place]) 32/99/2426  . Cataract 03/30/2015  . Other and unspecified disc disorder of cervical region 03/30/2015  . Insomnia 03/30/2015  . Centrilobular emphysema ([place]) 03/30/2015  . Clinical depression 03/30/2015  . Hyperlipidemia associated with type 2 diabetes mellitus ([place]) 03/30/2015  . Gastro-esophageal reflux disease without esophagitis 03/30/2015  . Increased thickness of nail 03/30/2015  . History of cerebrovascular accident with residual deficit 03/30/2015  . Osteoarthritis 03/30/2015  . Osteopenia 03/30/2015  . Allergic rhinitis 03/30/2015  . B12 deficiency 03/30/2015  . Tobacco use disorder 03/30/2015  . Vitamin D deficiency 03/30/2015  . Mycotic toenails 08/12/2014  . Diabetic peripheral neuropathy associated with type 2 diabetes mellitus ([place] [facility]) 08/12/2014    Conditions to be addressed/monitored: Anxiety and Depression; Limited social support, ADL IADL limitations, Mental Health Concerns  and Social Isolation  Care Plan : General Social Work (Adult)  Updates made by Greg Cutter, LCSW  since 10/05/2020 12:00 AM    Problem: Coping Skills (General Plan of Care)     Goal: Coping Skills Enhanced   Start Date: 08/24/2020  Recent Progress: On track  Note:   Evidence-based guidance:   Acknowledge, normalize and validate difficulty of making life-long lifestyle changes.   Identify current effective and ineffective coping strategies.   Encourage patient and caregiver participation in care to increase self-esteem, confidence and feelings of control.   Consider alternative and complementary therapy approaches such as meditation, mindfulness or yoga.   Encourage participation in cognitive behavioral therapy to foster a positive identity, increase self-awareness, as well as bolster self-esteem, confidence and self-efficacy.   Discuss spirituality; be present as concerns are identified; encourage journaling, prayer, worship services, meditation or pastoral counseling.   Encourage participation in pleasurable group activities such as hobbies, singing, sports or volunteering).   Encourage the use of mindfulness; refer for training or intensive intervention.   Consider the use of meditative movement therapy such as tai chi, yoga or qigong.   Promote a regular daily exercise program based on tolerance, ability and patient choice to support positive thinking about disease or aging.   Notes:   Current Barriers:  . Financial constraints . Limited social support . ADL IADL limitations . Mental Health Concerns  . Social Isolation . Limited access to caregiver .  Inability to perform ADL's independently . Inability to perform IADL's independently . Lacks knowledge of community resource: grief support resources within the area  Interventions: . Patient interviewed and appropriate assessments performed . Education provided on how to implement appropriate self-care and coping tools into her daily routine to combat negative thinking and depressive episodes. . Patient reports that her  feet continue to swell even though she elevates them on a pillow regularly. Patient reports that her right toe is red and irritated. Patient shares that she put some pain relief ointment on it last night which helped. . Patient is experiencing increase stress at this time because she has to relocate out of her daughter in [place]. She provided a move out date to [place] but patient reports that her daughter in law will hopefully allow her to stay at residence until she secures another source of housing. Patient has been active about researching low income apartments but is having trouble finding anything within her budget. Patient also does not have a source of stable transportation to go look at potential housing options. C3 referral placed on 08/24/20 for low income housing.  . Provided mental health counseling with regard to current stressors. LCSW used active and reflective listening and implemented appropriate interventions to help suppport patient and her emotional needs. Advised patient to implement deep breathing/grounding/meditation/self-care exercises into her daily routine to combat stressors. Education provided.  [name] LCSW was informed that patient's sleep hygiene and routine is the same. She reports ongoing difficulty with staying asleep at night which affects her energy, mobility and motivation. Patient reports interest in gaining medication assistance for this concern. Patient ask for [facility] office number so that she can set up appointment with PCP to discuss. LCSW provided education on healthy sleep hygiene and what that looks like. LCSW encouraged patient to implement a night time routine into her schedule that works best for her and that she is able to maintain. Advised patient to implement deep breathing/grounding/meditation/self-care exercises into her nightly routine to combat racing thoughts at night.  [name] LCSW reviewed upcoming appointments with patient. She is agreeable to contact [employer] to set  up transportation arrangements for future appointments.  . Provided patient with information about grief support resources within her area. Patient denies needing this support at this time but was appreciative of education that was provided.  . Discussed plans with patient for ongoing care management follow up and provided patient with direct contact information for care management team . Patient reports that she has been managing her blood pressure very well and is proud of this accomplishment. LCSW provided education on healthy self-care and provided positive reinforcement for her taking action to improve her overall health and symptoms.  . Assisted patient/caregiver with obtaining information about health plan benefits . LCSW used active and reflective listening and implemented appropriate interventions to help suppport patient and her emotional needs.  Timeframe:  Long-Range Goal Priority:  Medium Start Date:   09/09/20                          Expected End Date: 12/01/20                 Follow Up Date -90 days from 09/09/20  - check out options for in-home help, long-term care or hospice - complete a living will - discuss my treatment options with the doctor or nurse - do one enjoyable thing every day - do something different, like  talking to a new person or going to a new place, every day - learn something new by asking, reading and searching the Internet every day - make an audio or video recording for my loved ones - make shared treatment decisions with doctor - meditate daily - name a health care proxy (decision maker) - share memories using a picture album or scrapbook with my loved ones - spend time with a child every day, borrow one if I have to - spend time outdoors at least 3 times a week - strengthen or fix relationships with loved ones    Why is this important?    Having a long-term illness can be scary.   It can also be stressful for you and your caregiver.   These  steps may help.   Current Barriers:  . Financial constraints . Limited social support . ADL IADL limitations . Mental Health Concerns  . Social Isolation . Limited access to caregiver . Inability to perform ADL's independently . Inability to perform IADL's independently . Lacks knowledge of community resource: grief support resources within the area  Clinical Social Work Clinical Goal(s):  [name] Over the next 120 days, patient will work with SW to address concerns related to gaining additional support within the home and resource connection in order to maintain health and independency within the community  . Over the next 120 days, patient will demonstrate improved adherence to self care as evidenced by implementing healthy self-care into her daily routine such as: attending all medical appointments, taking time for self-reflection, taking medications as prescribed, drinking water and daily exercise to improve mobility.  Interventions: . Patient interviewed and appropriate assessments performed . Patient reports that she is safe in the house during the snow storm. She reports that her son brought her groceries and prepared her meals.  . Patient reports that she successfully received housing resource list that CCM LCSW and C3 Guide provided to her but has not had the chance to review or investigate list yet. CCM LCSW provided additional education on low income housing resources within her area.  . Education provided on how to implement appropriate self-care and coping tools into her daily routine. Patient was receptive to this education. . Patient reports that her feet continue to swell even though she elevates them on a pillow regularly. Patient reports that her right toe is red and irritated. Patient shares that she put some pain relief ointment on it last night which helped. . Provided mental health counseling with regard to current stressors with finding stable housing. LCSW used active and  reflective listening and implemented appropriate interventions to help suppport patient and her emotional needs. Advised patient to implement deep breathing/grounding/meditation/self-care exercises into her daily routine to combat stressors. Education provided.  [name] LCSW was informed that patient's sleep hygiene and routine is the same. She reports ongoing difficulty with staying asleep at night which affects her energy, mobility and motivation. Patient reports interest in gaining medication assistance for this concern. Patient ask for [name] office number so that she can set up appointment with PCP to discuss. LCSW provided education on healthy sleep hygiene and what that looks like. LCSW encouraged patient to implement a night time routine into her schedule that works best for her and that she is able to maintain. Advised patient to implement deep breathing/grounding/meditation/self-care exercises into her nightly routine to combat racing thoughts at night.   . Provided patient with information about mental health and financial support resources within her area.  Patient denies needing this support at this time but was appreciative of education that was provided.  . Discussed plans with patient for ongoing care management follow up and provided patient with direct contact information for care management team . Patient reports that she has been managing her blood pressure very well and is proud of this accomplishment. LCSW provided education on healthy self-care and provide positive reinforcement for taking action to improve her overall health and symptoms.  . Assisted patient/caregiver with obtaining information about health plan benefits . LCSW used active and reflective listening and implemented appropriate interventions to help suppport patient and her emotional needs. . Reviewed all upcoming medical appointments with [facility]. Patient has procedure tomorrow at 9 am and confirms to have stable transportation  for this. . Provided patient with information about LTC placement as patient already has Medicaid secondary insurance coverage. LCSW also provided patient with education on available transportation resources through [name] and [facility]. Patient will need to contact her Medicaid caseworker at [place] to notify them that she wishes to sign up for transportation services. Patient reports that her son is currently providing stable transportation for her to all of her medical appointments but she continues to miss scheduled appointments. Patient has still not contacted Medicaid caseworker to set up transportation services and discuss other further benefits that are available to her. Patient  was encouraged to do so today.  . Evaluated patient's stress level. Emotional Support provided due to ongoing health complications.  [name] 1:1 collaboration with PCP regarding development and update of comprehensive plan of care as evidenced by provider attestation and co-signature  . Discussed plans with patient for ongoing care management follow up and provided patient with direct contact information for care management team . Advised patient to contact CCM providers if she has any concerns in regards to her safety, housing or well-being. Patient confirms that her son manages all of her finances and she declines having any issues with this at this time other than needing new housing.  . Assisted patient/caregiver with obtaining information about health plan benefits . Provided education and assistance to client regarding Advanced Directives. . Provided education to patient/caregiver regarding level of care options. . Patient confirms to struggle with ongoing headaches which causes her stress/pain/fatigue. Emotional support provided and patient was receptive to this. .  Positive reinforcement provided for keeping up with all of her medical appointments in the past. However, patient missed her PCP appointment this week because she did  not call transportation 3 days in advance to set up arrangements. Patient was informed that housing resources that were left at front desk will be mailed to her instead. Patient was advised to contact [facility] to reschedule office visit with PCP. [name] Patient reports that her ramp is still effectively working for her. . Patient declines any urgent case management needs at this time. Patient confirms having food at this time. Son continues to provide food support to patient. Referrals made in the past for food insecurity.  Patient Self Care Activities:  . Attends all scheduled provider appointments . Calls provider office for new concerns or questions  Please see past updates related to this goal by clicking on the "Past Updates" button in the selected goal        Follow Up Plan: SW will follow up with patient by phone over the next quarter      [name], [place], MSW, [email or web address] Phone: [phone]

## 2020-10-06 ENCOUNTER — Telehealth: Payer: Self-pay | Admitting: Podiatry

## 2020-10-06 ENCOUNTER — Ambulatory Visit: Payer: Medicare Other | Admitting: Orthotics

## 2020-10-06 NOTE — Telephone Encounter (Signed)
Pt left message stating she received a call from [facility] yesterday to call to r/s appt that he was not in [place] office this morning.   I returned call and it rings but goes to a spanish speaking voicemail. Not sure the number is correct.

## 2020-10-06 NOTE — Telephone Encounter (Signed)
This is a little confused.  I told her I would be in a 9:00 am, to come in then; however, I did tell her that she should call her PCP and find who is the doc supervising Kendra [name], [place] who manages her Diabetes.   In fact, I told her to keep her appointment and I can submit paperwork if we discover an MD/DO who would sign off on documents.

## 2020-10-08 ENCOUNTER — Inpatient Hospital Stay: Payer: Medicare Other

## 2020-10-11 ENCOUNTER — Other Ambulatory Visit: Payer: Self-pay | Admitting: Family Medicine

## 2020-10-11 DIAGNOSIS — E118 Type 2 diabetes mellitus with unspecified complications: Secondary | ICD-10-CM

## 2020-10-11 DIAGNOSIS — J449 Chronic obstructive pulmonary disease, unspecified: Secondary | ICD-10-CM

## 2020-10-11 DIAGNOSIS — Z794 Long term (current) use of insulin: Secondary | ICD-10-CM

## 2020-10-12 ENCOUNTER — Ambulatory Visit: Payer: Self-pay | Admitting: Licensed Clinical Social Worker

## 2020-10-12 DIAGNOSIS — Z794 Long term (current) use of insulin: Secondary | ICD-10-CM

## 2020-10-12 DIAGNOSIS — I10 Essential (primary) hypertension: Secondary | ICD-10-CM

## 2020-10-12 DIAGNOSIS — E1169 Type 2 diabetes mellitus with other specified complication: Secondary | ICD-10-CM

## 2020-10-12 DIAGNOSIS — E118 Type 2 diabetes mellitus with unspecified complications: Secondary | ICD-10-CM

## 2020-10-12 DIAGNOSIS — E785 Hyperlipidemia, unspecified: Secondary | ICD-10-CM

## 2020-10-12 DIAGNOSIS — F324 Major depressive disorder, single episode, in partial remission: Secondary | ICD-10-CM

## 2020-10-12 NOTE — Chronic Care Management (AMB) (Signed)
Chronic Care Management    Clinical Social Work Note  10/12/2020 Name: Kendra [name] MRN: [id number] DOB: Feb 04, 1946  Kendra [name] is a 75 y.o. year old female who is a primary care patient of [name], [name], FNP. The CCM team was consulted to assist the patient with chronic disease management and/or care coordination needs related to: [employer] .   Engaged with patient by telephone for follow up visit in response to provider referral for social work chronic care management and care coordination services.   Consent to Services:  The patient was given the following information about Chronic Care Management services today, agreed to services, and gave verbal consent: 1. CCM service includes personalized support from designated clinical staff supervised by the primary care provider, including individualized plan of care and coordination with other care providers 2. 24/7 contact phone numbers for assistance for urgent and routine care needs. 3. Service will only be billed when office clinical staff spend 20 minutes or more in a month to coordinate care. 4. Only one practitioner may furnish and bill the service in a calendar month. 5.The patient may stop CCM services at any time (effective at the end of the month) by phone call to the office staff. 6. The patient will be responsible for cost sharing (co-pay) of up to 20% of the service fee (after annual deductible is met). Patient agreed to services and consent obtained.  Patient agreed to services and consent obtained.   Assessment: Review of patient past medical history, allergies, medications, and health status, including review of relevant consultants reports was performed today as part of a comprehensive evaluation and provision of chronic care management and care coordination services.     SDOH (Social Determinants of Health) assessments and interventions performed:    Advanced Directives Status: See Care Plan for related  entries.  CCM Care Plan  Allergies  Allergen Reactions  . Citalopram Nausea And Vomiting    Outpatient Encounter Medications as of 10/12/2020  Medication Sig  . albuterol (VENTOLIN HFA) 108 (90 Base) MCG/ACT inhaler INHALE 1-2 PUFFS EVERY 6 HOURS AS NEEDEDSHORTNESS OF BREATH AND WHEEZING  . aspirin 325 MG tablet Take 325 mg by mouth daily.  [name] atorvastatin (LIPITOR) 40 MG tablet TAKE 1 TABLET BY MOUTH ONCE DAILY  . Blood Glucose Monitoring Suppl (ONETOUCH VERIO) w/Device KIT USE AS DIRECTED  . buPROPion (WELLBUTRIN XL) 150 MG 24 hr tablet TAKE 1 TABLET BY MOUTH ONCE DAILY  . clopidogrel (PLAVIX) 75 MG tablet TAKE 1 TABLET BY MOUTH ONCE DAILY  . cyclobenzaprine (FLEXERIL) 5 MG tablet Take 0.5-1 tablets (2.5-5 mg total) by mouth 3 (three) times daily as needed for muscle spasms.  [name] glucose blood (ONETOUCH VERIO) test strip Use as instructed  . Lancet Devices (ONE TOUCH DELICA LANCING DEV) MISC 1 Device by Does not apply route 2 (two) times daily.  [name] lisinopril (ZESTRIL) 5 MG tablet Take 1 tablet (5 mg total) by mouth daily.  . metFORMIN (GLUCOPHAGE-XR) 500 MG 24 hr tablet TAKE 1 TABLET BY MOUTH TWICE DAILY BEFORE A MEAL  . nicotine polacrilex (COMMIT) 4 MG lozenge Take 4 mg by mouth as needed for smoking cessation. (Patient not taking: Reported on 10/01/2020)  . ondansetron (ZOFRAN) 4 MG tablet Take 2 tablets (8 mg total) by mouth every 8 (eight) hours as needed for nausea or vomiting.  [name] Lancets 54S MISC 1 Device by Does not apply route 2 (two) times daily.  . traZODone (DESYREL) 50 MG  tablet Take 0.5-1 tablets (25-50 mg total) by mouth at bedtime as needed for sleep.  . TRELEGY ELLIPTA 100-62.5-25 MCG/INH AEPB INHALE 1 PUFF INTO THE LUNGS ONCE DAILY   No facility-administered encounter medications on file as of 10/12/2020.    Patient Active Problem List   Diagnosis Date Noted  . Loss of taste 09/23/2020  . Vitamin B12 deficiency 08/20/2020  . Personal history of tobacco  use, presenting hazards to health 08/20/2020  . Normocytic anemia 06/15/2020  . Colon cancer screening 05/25/2020  . Right hand pain 01/29/2020  . Visit for suture removal 01/23/2020  . Encounter for screening colonoscopy 01/23/2020  . Carotid artery stenosis 12/30/2019  . 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 Weakness of left upper extremity 10/16/2019  . Left-sided weakness 10/16/2019  . Hypertension associated with diabetes ([place]) 12/19/2018  . Chronic midline low back pain without sciatica 05/07/2018  . Radiculopathy of leg 04/01/2017  . Calcification of aorta ([facility]) 09/27/2016  . Early satiety 07/26/2016  . CMC arthritis, thumb, degenerative 07/16/2015  . Weight loss 06/07/2015  . Tenosynovitis of wrist 06/07/2015  . Protein calorie malnutrition ([place]) 06/07/2015  . Diabetes mellitus type 2, controlled, with complications ([place]) 41/42/3953  . Cataract 03/30/2015  . Other and unspecified disc disorder of cervical region 03/30/2015  . Insomnia 03/30/2015  . Centrilobular emphysema ([place]) 03/30/2015  . Clinical depression 03/30/2015  . Hyperlipidemia associated with type 2 diabetes mellitus ([place]) 03/30/2015  . Gastro-esophageal reflux disease without esophagitis 03/30/2015  . Increased thickness of nail 03/30/2015  . History of cerebrovascular accident with residual deficit 03/30/2015  . Osteoarthritis 03/30/2015  . Osteopenia 03/30/2015  . Allergic rhinitis 03/30/2015  . B12 deficiency 03/30/2015  . Tobacco use disorder 03/30/2015  . Vitamin D deficiency 03/30/2015  . Mycotic toenails 08/12/2014  . Diabetic peripheral neuropathy associated with type 2 diabetes mellitus ([place]) 08/12/2014    Care Plan : General Social Work (Adult)  Updates made by Greg Cutter, LCSW since 10/12/2020 12:00 AM    Problem: Coping Skills (General Plan of Care)     Long-Range Goal: Coping Skills Enhanced   Start Date: 08/24/2020  Recent Progress: On track   Priority: Medium  Note:   Evidence-based guidance:   Acknowledge, normalize and validate difficulty of making life-long lifestyle changes.   Identify current effective and ineffective coping strategies.   Encourage patient and caregiver participation in care to increase self-esteem, confidence and feelings of control.   Consider alternative and complementary therapy approaches such as meditation, mindfulness or yoga.   Encourage participation in cognitive behavioral therapy to foster a positive identity, increase self-awareness, as well as bolster self-esteem, confidence and self-efficacy.   Discuss spirituality; be present as concerns are identified; encourage journaling, prayer, worship services, meditation or pastoral counseling.   Encourage participation in pleasurable group activities such as hobbies, singing, sports or volunteering).   Encourage the use of mindfulness; refer for training or intensive intervention.   Consider the use of meditative movement therapy such as tai chi, yoga or qigong.   Promote a regular daily exercise program based on tolerance, ability and patient choice to support positive thinking about disease or aging.    Timeframe:  Long-Range Goal Priority:  Medium Start Date:   09/09/20  , goal revisited on 10/12/20                      Expected End Date: 12/10/20  Follow Up Date -45 days from 10/12/20  - check out options for in-home help, long-term care or hospice - complete a living will - discuss my treatment options with the doctor or nurse - do one enjoyable thing every day - do something different, like talking to a new person or going to a new place, every day - learn something new by asking, reading and searching the Internet every day - make an audio or video recording for my loved ones - make shared treatment decisions with doctor - meditate daily - name a health care proxy (decision maker) - share memories using a picture  album or scrapbook with my loved ones - spend time with a child every day, borrow one if I have to - spend time outdoors at least 3 times a week - strengthen or fix relationships with loved ones    Why is this important?    Having a long-term illness can be scary.   It can also be stressful for you and your caregiver.   These steps may help.   Current Barriers:  . Financial constraints . Limited social support . ADL IADL limitations . Mental Health Concerns  . Social Isolation . Limited access to caregiver . Inability to perform ADL's independently . Inability to perform IADL's independently . Lacks knowledge of community resource: grief support resources within the area  Clinical Social Work Clinical Goal(s):  [name] Over the next 120 days, patient will work with SW to address concerns related to gaining additional support within the home and resource connection in order to maintain health and independency within the community  . Over the next 120 days, patient will demonstrate improved adherence to self care as evidenced by implementing healthy self-care into her daily routine such as: attending all medical appointments, taking time for self-reflection, taking medications as prescribed, drinking water and daily exercise to improve mobility.  Interventions: . Patient interviewed and appropriate assessments performed . Patient reports that she was safe in the house during the snow storm. She reports that her son brought her groceries and prepared her meals.  . Patient reports that she is looking forward to receiving her food stamps tomorrow as she struggles with affording groceries at times. Patient reports receiving over $100 in EBT per month. . Patient shares that her granddaughter has been providing additional support to her lately in terms of caregiving and running errands for her.  . Patient reports that she successfully received housing resource list that CCM LCSW and C3 Guide  provided to her but has not had the chance to review or investigate list yet. CCM LCSW provided additional education on low income housing resources within her area.  . Education provided on how to implement appropriate self-care and coping tools into her daily routine. Patient was receptive to this education. . Patient reports that her feet continue to swell even though she elevates them on a pillow regularly. Patient reports that her right toe is red and irritated. Patient shares that she put some pain relief ointment on it last night which helped. . Provided mental health counseling with regard to current stressors with finding stable housing. LCSW used active and reflective listening and implemented appropriate interventions to help suppport patient and her emotional needs. Advised patient to implement deep breathing/grounding/meditation/self-care exercises into her daily routine to combat stressors. Education provided.  [name] LCSW was informed that patient's sleep hygiene and routine is the same. She reports ongoing difficulty with staying asleep at night which affects her  energy, mobility and motivation. Patient reports interest in gaining medication assistance for this concern. Patient ask for [facility] office number so that she can set up appointment with PCP to discuss. LCSW provided education on healthy sleep hygiene and what that looks like. LCSW encouraged patient to implement a night time routine into her schedule that works best for her and that she is able to maintain. Advised patient to implement deep breathing/grounding/meditation/self-care exercises into her nightly routine to combat racing thoughts at night.   . Provided patient with information about mental health and financial support resources within her area. Patient denies needing this support at this time but was appreciative of education that was provided.  . Discussed plans with patient for ongoing care management follow up and provided  patient with direct contact information for care management team . Patient reports that she has been managing her blood pressure very well and is proud of this accomplishment. LCSW provided education on healthy self-care and provide positive reinforcement for taking action to improve her overall health and symptoms.  . Assisted patient/caregiver with obtaining information about health plan benefits . LCSW used active and reflective listening and implemented appropriate interventions to help suppport patient and her emotional needs. . Reviewed all upcoming medical appointments with patent. Patient has procedure tomorrow at 9 am and confirms to have stable transportation for this. . Provided patient with information about LTC placement as patient already has Medicaid secondary insurance coverage. LCSW also provided patient with education on available transportation resources through [facility] and [facility]. Patient will need to contact her Medicaid caseworker at [place] to notify them that she wishes to sign up for transportation services. Patient reports that her son is currently providing stable transportation for her to all of her medical appointments but she continues to miss scheduled appointments. Patient has still not contacted Medicaid caseworker to set up transportation services and discuss other further benefits that are available to her. Patient  was encouraged to do so today.  . Evaluated patient's stress level. Emotional Support provided due to ongoing health complications.  [name] 1:1 collaboration with PCP regarding development and update of comprehensive plan of care as evidenced by provider attestation and co-signature  . Discussed plans with patient for ongoing care management follow up and provided patient with direct contact information for care management team . Advised patient to contact CCM providers if she has any concerns in regards to her safety, housing or well-being. Patient confirms that her son  manages all of her finances and she declines having any issues with this at this time other than needing new housing.  . Assisted patient/caregiver with obtaining information about health plan benefits . Provided education and assistance to client regarding Advanced Directives. . Provided education to patient/caregiver regarding level of care options. . Patient confirms to struggle with ongoing headaches which causes her stress/pain/fatigue. Emotional support provided and patient was receptive to this. .  Positive reinforcement provided for keeping up with all of her medical appointments in the past. However, patient missed her PCP appointment this week because she did not call transportation 3 days in advance to set up arrangements. Patient was informed that housing resources that were left at front desk will be mailed to her instead. Patient was advised to contact [facility] to reschedule office visit with PCP. [name] Patient reports that her ramp is still effectively working for her. . Patient declines any urgent case management needs at this time. Patient confirms having food at this time. Son continues to  provide food support to patient. Referrals made in the past for food insecurity.  Patient Self Care Activities:  . Attends all scheduled provider appointments . Calls provider office for new concerns or questions  Please see past updates related to this goal by clicking on the "Past Updates" button in the selected goal       Follow Up Plan: SW will follow up with patient by phone over the next quarter      [name], [place], MSW, [email or web address] Phone: [phone]

## 2020-10-13 ENCOUNTER — Other Ambulatory Visit: Payer: Self-pay | Admitting: Family Medicine

## 2020-10-13 DIAGNOSIS — IMO0002 Reserved for concepts with insufficient information to code with codable children: Secondary | ICD-10-CM

## 2020-10-13 DIAGNOSIS — E785 Hyperlipidemia, unspecified: Secondary | ICD-10-CM

## 2020-10-13 DIAGNOSIS — E1165 Type 2 diabetes mellitus with hyperglycemia: Secondary | ICD-10-CM

## 2020-10-15 ENCOUNTER — Other Ambulatory Visit: Payer: Self-pay

## 2020-10-15 ENCOUNTER — Ambulatory Visit
Admission: RE | Admit: 2020-10-15 | Discharge: 2020-10-15 | Disposition: A | Discharge status: Home / Self Care | Payer: Medicare Other | Source: Ambulatory Visit | Attending: Family Medicine | Admitting: Family Medicine

## 2020-10-15 DIAGNOSIS — Z1231 Encounter for screening mammogram for malignant neoplasm of breast: Secondary | ICD-10-CM | POA: Diagnosis not present

## 2020-10-20 ENCOUNTER — Ambulatory Visit: Payer: Medicare Other | Admitting: Orthotics

## 2020-10-25 ENCOUNTER — Inpatient Hospital Stay: Payer: Medicare Other | Attending: Oncology

## 2020-10-25 DIAGNOSIS — E538 Deficiency of other specified B group vitamins: Secondary | ICD-10-CM | POA: Insufficient documentation

## 2020-10-25 MED ORDER — CYANOCOBALAMIN 1000 MCG/ML IJ SOLN
1000.0000 ug | Freq: Once | INTRAMUSCULAR | Status: AC
  Administered 2020-10-25: 1000 ug via INTRAMUSCULAR
  Filled 2020-10-25: qty 1

## 2020-10-27 NOTE — Chronic Care Management (AMB) (Signed)
  Care Management   Note  10/27/2020 Name: Kendra [name] MRN: [id number] DOB: 06/05/1946  [name] is a 75 y.o. year old female who is a primary care patient of [name], [name], [place] and is actively engaged with the care management team. I reached out to [name] by phone today to assist with re-scheduling a follow up visit with the Pharmacist  Follow up plan: Telephone appointment with care management team member scheduled for:11/01/2020  [name], [place], [place] [facility]  [place], [place] [address] Direct Dial: [phone] [email or web address]: [email or web address]

## 2020-11-01 ENCOUNTER — Ambulatory Visit (INDEPENDENT_AMBULATORY_CARE_PROVIDER_SITE_OTHER): Payer: Medicare Other | Admitting: Pharmacist

## 2020-11-01 DIAGNOSIS — E118 Type 2 diabetes mellitus with unspecified complications: Secondary | ICD-10-CM

## 2020-11-01 DIAGNOSIS — J432 Centrilobular emphysema: Secondary | ICD-10-CM | POA: Diagnosis not present

## 2020-11-01 DIAGNOSIS — Z794 Long term (current) use of insulin: Secondary | ICD-10-CM | POA: Diagnosis not present

## 2020-11-01 DIAGNOSIS — E785 Hyperlipidemia, unspecified: Secondary | ICD-10-CM | POA: Diagnosis not present

## 2020-11-01 DIAGNOSIS — I10 Essential (primary) hypertension: Secondary | ICD-10-CM | POA: Diagnosis not present

## 2020-11-01 DIAGNOSIS — E1169 Type 2 diabetes mellitus with other specified complication: Secondary | ICD-10-CM

## 2020-11-01 NOTE — Patient Instructions (Signed)
Visit Information  PATIENT GOALS: Goals Addressed            This Visit's Progress   . Pharmacy - Patient Goals       -Please continue to keep log of home blood sugar results and have for [place] to review during our calls -Please continue to keep log of blood pressure results and have for [place] to review during our calls  I look forward to talking to you during our next telephone appointment. Please call if you need something sooner!  [name], PharmD, [place] [phone]        The patient verbalized understanding of instructions, educational materials, and care plan provided today and declined offer to receive copy of patient instructions, educational materials, and care plan.   Telephone follow up appointment with care management team member scheduled for: 12/08/2020 at 12:30 PM

## 2020-11-01 NOTE — Chronic Care Management (AMB) (Signed)
Chronic Care Management Pharmacy Note  11/01/2020 Name:  Kendra [name] MRN:  [id number] DOB:  Jan 20, 1946  Subjective: Kendra [name] is an 75 y.o. year old female who is a primary patient of [name], [name], FNP.  The CCM team was consulted for assistance with disease management and care coordination needs.    Engaged with patient by telephone for follow up visit in response to provider referral for pharmacy case management and/or care coordination services.   Consent to Services:  The patient was given information about Chronic Care Management services, agreed to services, and gave verbal consent prior to initiation of services.  Please see initial visit note for detailed documentation.   Objective:  Lab Results  Component Value Date   CREATININE 0.66 07/20/2020   CREATININE 0.83 06/14/2020   CREATININE 0.72 02/06/2020    Lab Results  Component Value Date   HGBA1C 5.8 (A) 09/23/2020       Component Value Date/Time   CHOL 114 06/14/2020 0929   CHOL 177 06/07/2015 1215   CHOL 141 09/02/2014 0440   TRIG 82 06/14/2020 0929   TRIG 155 09/02/2014 0440   HDL 60 06/14/2020 0929   HDL 56 06/07/2015 1215   HDL 37 (L) 09/02/2014 0440   CHOLHDL 1.9 06/14/2020 0929   VLDL 16 10/17/2019 0606   VLDL 31 09/02/2014 0440   LDLCALC 38 06/14/2020 0929   LDLCALC 73 09/02/2014 0440    BP Readings from Last 3 Encounters:  09/30/20 119/83  09/23/20 112/69  09/02/20 139/78    Assessment: Review of patient past medical history, allergies, medications, health status, including review of consultants reports, laboratory and other test data, was performed as part of comprehensive evaluation and provision of chronic care management services.   SDOH:  (Social Determinants of Health) assessments and interventions performed: yes   CCM Care Plan  Allergies  Allergen Reactions  . Citalopram Nausea And Vomiting    Medications Reviewed Today    Reviewed by Day, Parks Neptune, CMA  (Certified Medical Assistant) on 10/01/20 at [place] List Status: <None>  Medication Order Taking? Sig Documenting Provider Last Dose Status Informant  albuterol (VENTOLIN HFA) 108 (90 Base) MCG/ACT inhaler [id number] Yes INHALE 1-2 PUFFS EVERY 6 HOURS AS NEEDEDSHORTNESS OF BREATH AND WHEEZING Malfi, [name], FNP Taking Active   aspirin 325 MG tablet [id number] Yes Take 325 mg by mouth daily. [provider] Taking Active   atorvastatin (LIPITOR) 40 MG tablet [id number] Yes TAKE 1 TABLET BY MOUTH ONCE DAILY Malfi, [name], FNP Taking Active   Blood Glucose Monitoring Suppl [facility] VERIO) w/Device Drucie Opitz [id number] Yes USE AS DIRECTED [name], FNP Taking Active   buPROPion (WELLBUTRIN XL) 150 MG 24 hr tablet [id number] Yes TAKE 1 TABLET BY MOUTH ONCE DAILY Malfi, [name], FNP Taking Active   clopidogrel (PLAVIX) 75 MG tablet [id number] Yes TAKE 1 TABLET BY MOUTH ONCE DAILY Malfi, [name], FNP Taking Active   cyclobenzaprine (FLEXERIL) 5 MG tablet [id number] Yes Take 0.5-1 tablets (2.5-5 mg total) by mouth 3 (three) times daily as needed for muscle spasms. [name], FNP Taking Active   glucose blood [facility] VERIO) test strip [id number] Yes Use as instructed Malfi, [name], FNP Taking Active   Lancet Devices (ONE TOUCH DELICA LANCING DEV) MISC [id number] Yes 1 Device by Does not apply route 2 (two) times daily. [name], FNP Taking Active   lisinopril (ZESTRIL) 5 MG tablet [id number] Yes Take 1 tablet (  5 mg total) by mouth daily. [name], FNP Taking Active   metFORMIN (GLUCOPHAGE-XR) 500 MG 24 hr tablet [id number] Yes TAKE 1 TABLET BY MOUTH TWICE DAILY BEFORE A MEAL Malfi, [name], FNP Taking Active   nicotine polacrilex (COMMIT) 4 MG lozenge [id number] No Take 4 mg by mouth as needed for smoking cessation.  Patient not taking: Reported on 10/01/2020   [provider] Not Taking Active Self  ondansetron (ZOFRAN) 4 MG tablet [id number] Yes Take 2 tablets (8 mg  total) by mouth every 8 (eight) hours as needed for nausea or vomiting. [name], FNP Taking Active   OneTouch Delica Lancets 62I MISC [id number] Yes 1 Device by Does not apply route 2 (two) times daily. [name], NP Taking Active Self  traZODone (DESYREL) 50 MG tablet [id number] Yes Take 0.5-1 tablets (25-50 mg total) by mouth at bedtime as needed for sleep. [name], FNP Taking Active   TRELEGY ELLIPTA 100-62.5-25 MCG/INH AEPB [id number] Yes INHALE 1 PUFF INTO THE LUNGS ONCE DAILY Malfi, [name], FNP Taking Active           Patient Active Problem List   Diagnosis Date Noted  . Loss of taste 09/23/2020  . Vitamin B12 deficiency 08/20/2020  . Personal history of tobacco use, presenting hazards to health 08/20/2020  . Normocytic anemia 06/15/2020  . Colon cancer screening 05/25/2020  . Right hand pain 01/29/2020  . Visit for suture removal 01/23/2020  . Encounter for screening colonoscopy 01/23/2020  . Carotid artery stenosis 12/30/2019  . 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 Weakness of left upper extremity 10/16/2019  . Left-sided weakness 10/16/2019  . Hypertension associated with diabetes ([place]) 12/19/2018  . Chronic midline low back pain without sciatica 05/07/2018  . Radiculopathy of leg 04/01/2017  . Calcification of aorta (HCC) 09/27/2016  . Early satiety 07/26/2016  . CMC arthritis, thumb, degenerative 07/16/2015  . Weight loss 06/07/2015  . Tenosynovitis of wrist 06/07/2015  . Protein calorie malnutrition ([place]) 06/07/2015  . Diabetes mellitus type 2, controlled, with complications ([place]) 78/93/8101  . Cataract 03/30/2015  . Other and unspecified disc disorder of cervical region 03/30/2015  . Insomnia 03/30/2015  . Centrilobular emphysema ([place]) 03/30/2015  . Clinical depression 03/30/2015  . Hyperlipidemia associated with type 2 diabetes mellitus ([place]) 03/30/2015  . Gastro-esophageal reflux disease  without esophagitis 03/30/2015  . Increased thickness of nail 03/30/2015  . History of cerebrovascular accident with residual deficit 03/30/2015  . Osteoarthritis 03/30/2015  . Osteopenia 03/30/2015  . Allergic rhinitis 03/30/2015  . B12 deficiency 03/30/2015  . Tobacco use disorder 03/30/2015  . Vitamin D deficiency 03/30/2015  . Mycotic toenails 08/12/2014  . Diabetic peripheral neuropathy associated with type 2 diabetes mellitus ([place]) 08/12/2014    Conditions to be addressed/monitored: HTN, HLD, COPD and DMII  Care Plan : PharmD - Medication Mgmt  Updates made by Vella Raring, [place] since 11/01/2020 12:00 AM    Problem: Disease Progression     Long-Range Goal: Disease Progression Prevented or Minimized   Start Date: 11/01/2020  Expected End Date: 01/30/2021  This Visit's Progress: On track  Priority: High  Note:   Current Barriers:  . Financial Barriers . Limited social support  Pharmacist Clinical Goal(s):  [name] Over the next 90 days, patient will maintain adherence to monitoring guidelines and medication adherence to achieve therapeutic efficacy through collaboration with PharmD and provider.  .   Interventions: . 1:1 collaboration with  Malfi, [name], FNP regarding development and update of comprehensive plan of care as evidenced by provider attestation and co-signature . Inter-disciplinary care team collaboration (see longitudinal plan of care)  Hypertension . Current treatment: lisinopril 5 mg once daily from pill pack . Reports recent home blood pressure readings: o 2/14: 138/85, HR 76 o 2/10: 128/79, HR 82 . Encourage patient to continue to monitor at home, keep log and bring record with her to medical appointments  Type 2 Diabetes . Controlled; current treatment: metformin ER 500 mg twice daily from pill pack . Reports recent fasting readings ranging 80s-130s . Have counseled on importance of having regular well-balanced meals and limiting carbohydrate  portion sizes . Encourage patient to continue to monitor at home, keep log and bring record with her to medical appointments  Hyperlipidemia . Controlled; current treatment: atorvastatin 40 mg daily . Reports taking as directed; denies missed doses  Medication Adherence . Confirms continues to take medications as directed from pill packs as received from [facility]  . Reports using maintenance Trelegy inhaler daily (and rinsing mouth out after each use) and rescue (albuterol) as needed as directed.  . Counsel patient on technique for cleaning plastic mouthpiece (but not the metal canister) of albuterol inhaler if it has not been used in a while to avoid build up that can block the spray  Smoking Cessation . Note patient had previously reported she quit smoking after Thanksgiving . Today reports she is currently smoking ~1/2 cigarette/day . Discuss beneifts of complete smoking cessation   Patient Goals/Self-Care Activities . Over the next 90 days, patient will:  - take medications as prescribed  Using pill packaging from [facility] - check glucose, document, and provide at future appointments - check blood pressure, document, and provide at future appointments  Follow Up Plan: Telephone follow up appointment with care management team member scheduled for: 12/08/2020 at 12:30 PM      Follow Up:  Patient agrees to Care Plan and Follow-up.  [name], PharmD, [place] [phone]

## 2020-11-05 ENCOUNTER — Other Ambulatory Visit: Payer: Self-pay | Admitting: Family Medicine

## 2020-11-05 ENCOUNTER — Other Ambulatory Visit: Payer: Self-pay

## 2020-11-05 DIAGNOSIS — Z794 Long term (current) use of insulin: Secondary | ICD-10-CM

## 2020-11-05 DIAGNOSIS — IMO0002 Reserved for concepts with insufficient information to code with codable children: Secondary | ICD-10-CM

## 2020-11-05 DIAGNOSIS — E1165 Type 2 diabetes mellitus with hyperglycemia: Secondary | ICD-10-CM

## 2020-11-05 DIAGNOSIS — J432 Centrilobular emphysema: Secondary | ICD-10-CM

## 2020-11-05 DIAGNOSIS — F3341 Major depressive disorder, recurrent, in partial remission: Secondary | ICD-10-CM

## 2020-11-05 DIAGNOSIS — E785 Hyperlipidemia, unspecified: Secondary | ICD-10-CM

## 2020-11-05 MED ORDER — TRELEGY ELLIPTA 100-62.5-25 MCG/INH IN AEPB: INHALATION_SPRAY | RESPIRATORY_TRACT | 1 refills | Status: DC

## 2020-11-05 MED ORDER — METFORMIN HCL ER 500 MG PO TB24
500.0000 mg | ORAL_TABLET | Freq: Two times a day (BID) | ORAL | 0 refills | Status: DC
Start: 2020-11-05 — End: 2020-12-21

## 2020-11-05 MED ORDER — BUPROPION HCL ER (XL) 150 MG PO TB24: 150.0000 mg | ORAL_TABLET | Freq: Every day | ORAL | 0 refills | Status: DC

## 2020-11-08 ENCOUNTER — Other Ambulatory Visit: Payer: Self-pay

## 2020-11-08 DIAGNOSIS — J449 Chronic obstructive pulmonary disease, unspecified: Secondary | ICD-10-CM

## 2020-11-09 ENCOUNTER — Telehealth: Payer: Self-pay | Admitting: Licensed Clinical Social Worker

## 2020-11-09 ENCOUNTER — Telehealth: Payer: Self-pay

## 2020-11-09 NOTE — Telephone Encounter (Signed)
  Chronic Care Management    Clinical Social Work General Follow Up Note  11/09/2020 Name: Kendra [name] MRN: [id number] DOB: 1946/08/08  Tresa Moore Nida is a 75 y.o. year old female who is a primary care patient of [name], FNP. The CCM team was consulted for assistance with [employer] .   Review of patient status, including review of consultants reports, relevant laboratory and other test results, and collaboration with appropriate care team members and the patient's provider was performed as part of comprehensive patient evaluation and provision of chronic care management services.    LCSW completed CCM outreach attempt today but was unable to reach patient or family successfully. A HIPPA compliant voice message was left encouraging patient to return call once available. LCSW will reschedule patient's CCM Social Work appointment if no return call has been made.  A HIPPA compliant phone message was left for the patient providing contact information and requesting a return call.   Outpatient Encounter Medications as of 11/09/2020  Medication Sig  . albuterol (VENTOLIN HFA) 108 (90 Base) MCG/ACT inhaler INHALE 1-2 PUFFS EVERY 6 HOURS AS NEEDEDSHORTNESS OF BREATH AND WHEEZING  . aspirin 325 MG tablet Take 325 mg by mouth daily.  [name] atorvastatin (LIPITOR) 40 MG tablet TAKE 1 TABLET BY MOUTH BEDTIME  . Blood Glucose Monitoring Suppl (ONETOUCH VERIO) w/Device KIT USE AS DIRECTED  . buPROPion (WELLBUTRIN XL) 150 MG 24 hr tablet Take 1 tablet (150 mg total) by mouth daily.  . clopidogrel (PLAVIX) 75 MG tablet TAKE 1 TABLET BY MOUTH ONCE DAILY  . cyclobenzaprine (FLEXERIL) 5 MG tablet Take 0.5-1 tablets (2.5-5 mg total) by mouth 3 (three) times daily as needed for muscle spasms.  . Fluticasone-Umeclidin-Vilant (TRELEGY ELLIPTA) 100-62.5-25 MCG/INH AEPB INHALE 1 PUFF INTO THE LUNGS ONCE DAILY  . glucose blood (ONETOUCH VERIO) test strip Use as instructed  . Lancet Devices (ONE  TOUCH DELICA LANCING DEV) MISC 1 Device by Does not apply route 2 (two) times daily.  [name] lisinopril (ZESTRIL) 5 MG tablet Take 1 tablet (5 mg total) by mouth daily.  . metFORMIN (GLUCOPHAGE-XR) 500 MG 24 hr tablet Take 1 tablet (500 mg total) by mouth 2 (two) times daily before a meal.  . nicotine polacrilex (COMMIT) 4 MG lozenge Take 4 mg by mouth as needed for smoking cessation. (Patient not taking: Reported on 10/01/2020)  . ondansetron (ZOFRAN) 4 MG tablet Take 2 tablets (8 mg total) by mouth every 8 (eight) hours as needed for nausea or vomiting.  [name] Lancets 10X MISC 1 Device by Does not apply route 2 (two) times daily.  . traZODone (DESYREL) 50 MG tablet Take 0.5-1 tablets (25-50 mg total) by mouth at bedtime as needed for sleep.   No facility-administered encounter medications on file as of 11/09/2020.    Follow Up Plan: [place] will reach out to patient to reschedule appointment.   [name], BSW, MSW, [email or web address] Phone: [phone]

## 2020-11-11 ENCOUNTER — Telehealth: Payer: Self-pay | Admitting: Family Medicine

## 2020-11-11 NOTE — Telephone Encounter (Signed)
Copied from [place] [id number]. Topic: Medicare AWV >> Nov 11, 2020 12:28 PM [place] Anna wrote: Reason for CRM:  2/24 Tried contacting to notify AWVS on March 1st will be by phone not in the office -srs

## 2020-11-16 ENCOUNTER — Ambulatory Visit: Payer: Medicare Other

## 2020-11-16 ENCOUNTER — Other Ambulatory Visit: Payer: Self-pay

## 2020-11-16 ENCOUNTER — Telehealth: Payer: Self-pay

## 2020-11-16 DIAGNOSIS — E538 Deficiency of other specified B group vitamins: Secondary | ICD-10-CM

## 2020-11-16 DIAGNOSIS — D649 Anemia, unspecified: Secondary | ICD-10-CM

## 2020-11-16 NOTE — Telephone Encounter (Signed)
This nurse attempted to call patient for scheduled telephonic AWV. Called three times at 0900, 0905, and 0915. Unable to leave a message due to full mailbox.

## 2020-11-17 ENCOUNTER — Inpatient Hospital Stay: Payer: Medicaid Other | Attending: Oncology

## 2020-11-18 ENCOUNTER — Other Ambulatory Visit: Payer: Self-pay

## 2020-11-18 ENCOUNTER — Emergency Department: Payer: Medicare Other

## 2020-11-18 ENCOUNTER — Emergency Department
Admission: EM | Admit: 2020-11-18 | Discharge: 2020-11-18 | Disposition: A | Discharge status: Home / Self Care | Payer: Medicare Other | Attending: Emergency Medicine | Admitting: Emergency Medicine

## 2020-11-18 DIAGNOSIS — S0003XA Contusion of scalp, initial encounter: Secondary | ICD-10-CM | POA: Diagnosis not present

## 2020-11-18 DIAGNOSIS — E1142 Type 2 diabetes mellitus with diabetic polyneuropathy: Secondary | ICD-10-CM | POA: Diagnosis not present

## 2020-11-18 DIAGNOSIS — F17210 Nicotine dependence, cigarettes, uncomplicated: Secondary | ICD-10-CM | POA: Diagnosis not present

## 2020-11-18 DIAGNOSIS — J449 Chronic obstructive pulmonary disease, unspecified: Secondary | ICD-10-CM | POA: Insufficient documentation

## 2020-11-18 DIAGNOSIS — Z7984 Long term (current) use of oral hypoglycemic drugs: Secondary | ICD-10-CM | POA: Insufficient documentation

## 2020-11-18 DIAGNOSIS — Z7982 Long term (current) use of aspirin: Secondary | ICD-10-CM | POA: Insufficient documentation

## 2020-11-18 DIAGNOSIS — Z8673 Personal history of transient ischemic attack (TIA), and cerebral infarction without residual deficits: Secondary | ICD-10-CM | POA: Insufficient documentation

## 2020-11-18 DIAGNOSIS — W01198A Fall on same level from slipping, tripping and stumbling with subsequent striking against other object, initial encounter: Secondary | ICD-10-CM | POA: Insufficient documentation

## 2020-11-18 DIAGNOSIS — I10 Essential (primary) hypertension: Secondary | ICD-10-CM | POA: Diagnosis not present

## 2020-11-18 DIAGNOSIS — Z79899 Other long term (current) drug therapy: Secondary | ICD-10-CM | POA: Insufficient documentation

## 2020-11-18 DIAGNOSIS — S022XXA Fracture of nasal bones, initial encounter for closed fracture: Secondary | ICD-10-CM | POA: Diagnosis not present

## 2020-11-18 DIAGNOSIS — Z7902 Long term (current) use of antithrombotics/antiplatelets: Secondary | ICD-10-CM | POA: Insufficient documentation

## 2020-11-18 DIAGNOSIS — S0993XA Unspecified injury of face, initial encounter: Secondary | ICD-10-CM | POA: Diagnosis present

## 2020-11-18 DIAGNOSIS — Y92009 Unspecified place in unspecified non-institutional (private) residence as the place of occurrence of the external cause: Secondary | ICD-10-CM | POA: Diagnosis not present

## 2020-11-18 NOTE — Discharge Instructions (Signed)
Your CT scans did not show any serious injuries.  There is a nasal bone fracture.  Please follow-up with ENT regarding this finding.

## 2020-11-18 NOTE — ED Provider Notes (Signed)
[facility] Emergency Department Provider Note  ____________________________________________  Time seen: Approximately 10:55 PM  I have reviewed the triage vital signs and the nursing notes.   HISTORY  Chief Complaint Fall    HPI Kendra [name] is a 75 y.o. female with a history of COPD diabetes diabetic neuropathy and prior strokes who reports that she was leaning over to do some dusting today when her leg gave out causing her to fall forward onto her face on the carpet.  No LOC or neck pain.  No vision changes paresthesias or motor weakness.  She does have some forehead pain.  She had an initial nosebleed which has resolved.      Past Medical History:  Diagnosis Date  . Asthma   . Back pain   . Cataract   . Cervical disc disorder   . COPD (chronic obstructive pulmonary disease) ([place])   . Depression   . Diabetes ([place])    type II  . Diabetic neuropathy ([place])   . Dyspnea    with exertion  . GERD (gastroesophageal reflux disease)   . History of kidney stones    per patient "can't remember the year"  . Hyperlipidemia   . Hypertension    per patient "take meds for it"  . Insomnia   . Neuropathy   . Osteoarthritis   . Osteopenia   . Pneumonia 2018   per patient  . Reflux   . Rheumatoid arthritis ([place])   . Stroke ([place]) 2015   and again 2017  - Weakness in left leg, staggers w/ walking, vision   . Tenosynovitis      Patient Active Problem List   Diagnosis Date Noted  . Loss of taste 09/23/2020  . Vitamin B12 deficiency 08/20/2020  . Personal history of tobacco use, presenting hazards to health 08/20/2020  . Normocytic anemia 06/15/2020  . Colon cancer screening 05/25/2020  . Right hand pain 01/29/2020  . Visit for suture removal 01/23/2020  . Encounter for screening colonoscopy 01/23/2020  . Carotid artery stenosis 12/30/2019  . 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 Weakness of left upper  extremity 10/16/2019  . Left-sided weakness 10/16/2019  . Hypertension associated with diabetes ([place]) 12/19/2018  . Chronic midline low back pain without sciatica 05/07/2018  . Radiculopathy of leg 04/01/2017  . Calcification of aorta (H[facility]) 09/27/2016  . Early satiety 07/26/2016  . CMC arthritis, thumb, degenerative 07/16/2015  . Weight loss 06/07/2015  . Tenosynovitis of wrist 06/07/2015  . Protein calorie malnutrition ([place]) 06/07/2015  . Diabetes mellitus type 2, controlled, with complications ([place]) 83/38/2505  . Cataract 03/30/2015  . Other and unspecified disc disorder of cervical region 03/30/2015  . Insomnia 03/30/2015  . Centrilobular emphysema ([place]) 03/30/2015  . Clinical depression 03/30/2015  . Hyperlipidemia associated with type 2 diabetes mellitus ([place]) 03/30/2015  . Gastro-esophageal reflux disease without esophagitis 03/30/2015  . Increased thickness of nail 03/30/2015  . History of cerebrovascular accident with residual deficit 03/30/2015  . Osteoarthritis 03/30/2015  . Osteopenia 03/30/2015  . Allergic rhinitis 03/30/2015  . B12 deficiency 03/30/2015  . Tobacco use disorder 03/30/2015  . Vitamin D deficiency 03/30/2015  . Mycotic toenails 08/12/2014  . Diabetic peripheral neuropathy associated with type 2 diabetes mellitus ([place]) 08/12/2014     Past Surgical History:  Procedure Laterality Date  . BREAST BIOPSY Right    Fatty Tumor  . ENDARTERECTOMY Right 10/21/2019   Procedure: ENDARTERECTOMY CAROTID RIGHT;  Surgeon: Donzetta Matters,  Georgia Dom, MD;  Location: [place];  Service: Vascular;  Laterality: Right;  . ENDARTERECTOMY Left 12/30/2019   Procedure: LEFT CAROTID ENDARTERECTOMY;  Surgeon: [name], MD;  Location: [place];  Service: Vascular;  Laterality: Left;  . NECK SURGERY    . OVARIAN CYST REMOVAL    . PATCH ANGIOPLASTY Right 10/21/2019   Procedure: Patch Angioplasty Using XenoSure Biologic Patch Right Carotid;  Surgeon: [name], MD;  Location: [place];  Service: Vascular;  Laterality: Right;  . PATCH ANGIOPLASTY Left 12/30/2019   Procedure: Patch Angioplasty Left Carotid;  Surgeon: [name], MD;  Location: [place];  Service: Vascular;  Laterality: Left;     Prior to Admission medications   Medication Sig Start Date End Date Taking? Authorizing Provider  albuterol (VENTOLIN HFA) 108 (90 Base) MCG/ACT inhaler INHALE 1-2 PUFFS EVERY 6 HOURS AS NEEDEDSHORTNESS OF BREATH AND WHEEZING 10/11/20   Malfi, [name], FNP  aspirin 325 MG tablet Take 325 mg by mouth daily.    [provider]  atorvastatin (LIPITOR) 40 MG tablet TAKE 1 TABLET BY MOUTH BEDTIME 10/14/20   Olin Hauser, DO  Blood Glucose Monitoring Suppl [facility] VERIO) w/Device KIT USE AS DIRECTED 04/05/20   Malfi, [name], FNP  buPROPion (WELLBUTRIN XL) 150 MG 24 hr tablet Take 1 tablet (150 mg total) by mouth daily. 11/05/20   Karamalegos, Devonne Doughty, DO  clopidogrel (PLAVIX) 75 MG tablet TAKE 1 TABLET BY MOUTH ONCE DAILY 08/09/20   Malfi, [name], FNP  cyclobenzaprine (FLEXERIL) 5 MG tablet Take 0.5-1 tablets (2.5-5 mg total) by mouth 3 (three) times daily as needed for muscle spasms. 09/14/20   [name], FNP  Fluticasone-Umeclidin-Vilant (TRELEGY ELLIPTA) 100-62.5-25 MCG/INH AEPB INHALE 1 PUFF INTO THE LUNGS ONCE DAILY 11/05/20   Olin Hauser, DO  glucose blood [name] VERIO) test strip Use as instructed 01/02/20   Malfi, [name], FNP  Lancet Devices (ONE TOUCH DELICA LANCING DEV) MISC 1 Device by Does not apply route 2 (two) times daily. 01/02/20   Malfi, [name], FNP  lisinopril (ZESTRIL) 5 MG tablet Take 1 tablet (5 mg total) by mouth daily. 01/23/20   Malfi, [name], FNP  metFORMIN (GLUCOPHAGE-XR) 500 MG 24 hr tablet Take 1 tablet (500 mg total) by mouth 2 (two) times daily before a meal. 11/05/20   Karamalegos, Devonne Doughty, DO  nicotine polacrilex (COMMIT) 4 MG lozenge Take 4 mg by mouth as needed  for smoking cessation. Patient not taking: Reported on 10/01/2020    [provider]  ondansetron (ZOFRAN) 4 MG tablet Take 2 tablets (8 mg total) by mouth every 8 (eight) hours as needed for nausea or vomiting. 01/19/20   Malfi, [name], FNP  OneTouch Delica Lancets 99H MISC 1 Device by Does not apply route 2 (two) times daily. 12/20/18   [name], NP  traZODone (DESYREL) 50 MG tablet Take 0.5-1 tablets (25-50 mg total) by mouth at bedtime as needed for sleep. 09/13/20   Malfi, [name], FNP     Allergies Citalopram   Family History  Problem Relation Age of Onset  . Stroke Father   . Depression Father   . Diabetes Mother   . Hyperlipidemia Mother   . Breast cancer Mother   . Heart murmur Son   . Heart murmur Son   . Heart murmur Son     Social History Social History   Tobacco Use  . Smoking status: Current Some  Day Smoker    Packs/day: 0.10    Years: 61.00    Pack years: 6.10    Types: Cigarettes  . Smokeless tobacco: Former Systems developer    Types: Snuff  . Tobacco comment: .5 of 1 cigarette a day  Vaping Use  . Vaping Use: Never used  Substance Use Topics  . Alcohol use: No  . Drug use: No    Review of Systems  Constitutional:   No fever or chills.  ENT:   No sore throat. No rhinorrhea. Cardiovascular:   No chest pain or syncope. Respiratory:   No dyspnea or cough. Gastrointestinal:   Negative for abdominal pain, vomiting and diarrhea.  Musculoskeletal:   Negative for focal pain or swelling All other systems reviewed and are negative except as documented above in ROS and HPI.  ____________________________________________   PHYSICAL EXAM:  VITAL SIGNS: ED Triage Vitals  Enc Vitals Group     BP 11/18/20 2004 137/76     Pulse Rate 11/18/20 2004 87     Resp 11/18/20 2004 16     Temp 11/18/20 2004 98.6 F (37 C)     Temp Source 11/18/20 2004 Oral     SpO2 11/18/20 2004 99 %     Weight 11/18/20 2006 123 lb (55.8 kg)     Height 11/18/20 2006 5'  3" (1.6 m)     Head Circumference --      Peak Flow --      Pain Score 11/18/20 2006 0     Pain Loc --      Pain Edu? --      Excl. in [place]? --     Vital signs reviewed, nursing assessments reviewed.   Constitutional:   Alert and oriented. Non-toxic appearance. Eyes:   Conjunctivae are normal. EOMI. PERRL. ENT      Head:   Normocephalic with forehead hematoma.  No laceration.      Nose: No septal hematoma or epistaxis.  There is swelling of the nasal bridge      Mouth/Throat:   Normal, no intraoral injuries.      Neck:   No meningismus. Full ROM.  No midline tenderness Hematological/Lymphatic/Immunilogical:   No cervical lymphadenopathy. Cardiovascular:   RRR. Symmetric bilateral radial and DP pulses.  No murmurs. Cap refill less than 2 seconds. Respiratory:   Normal respiratory effort without tachypnea/retractions. Breath sounds are clear and equal bilaterally. No wheezes/rales/rhonchi. Gastrointestinal:   Soft and nontender. Non distended.   No rebound, rigidity, or guarding.  Musculoskeletal:   Normal range of motion in all extremities. No joint effusions.  No lower extremity tenderness.  No edema. Neurologic:   Normal speech and language.  Motor grossly intact. No acute focal neurologic deficits are appreciated.  Skin:    Skin is warm, dry and intact. No rash noted.  No petechiae, purpura, or bullae.  ____________________________________________    LABS (pertinent positives/negatives) (all labs ordered are listed, but only abnormal results are displayed) Labs Reviewed - No data to display ____________________________________________   EKG  Interpreted by me  Date: 11/18/2020  Rate: 89  Rhythm: normal sinus rhythm  QRS Axis: normal  Intervals: normal  ST/T Wave abnormalities: normal  Conduction Disutrbances: none  Narrative Interpretation: unremarkable      ____________________________________________    RADIOLOGY  CT Head Wo Contrast  Result Date:  11/18/2020 CLINICAL DATA:  [place], hit head, anticoagulated EXAM: CT HEAD WITHOUT CONTRAST TECHNIQUE: Contiguous axial images were obtained from the base of the skull through  the vertex without intravenous contrast. COMPARISON:  01/15/2020 FINDINGS: Brain: There are stable chronic small-vessel ischemic changes throughout the bilateral periventricular and subcortical white matter, as well as the bilateral basal ganglia, right greater than left. No acute infarct or hemorrhage. Ex vacuo dilatation right lateral ventricle. Otherwise the lateral ventricles and midline structures are unremarkable. No acute extra-axial fluid collections. No mass effect. Vascular: No hyperdense vessel or unexpected calcification. Skull: Midline frontal scalp hematoma. No underlying fracture. Remainder of the calvarium is unremarkable. Sinuses/Orbits: Minimally displaced right-sided nasal bone fracture. The sinuses are clear. Other: Reconstructed images demonstrate no additional findings. IMPRESSION: 1. Midline frontal scalp hematoma.  No underlying fracture. 2. No acute intracranial process. 3. Chronic ischemic changes as above. Electronically Signed   By: Randa Ngo M.D.   On: 11/18/2020 20:55   CT Cervical Spine Wo Contrast  Result Date: 11/18/2020 CLINICAL DATA:  [place], pain EXAM: CT CERVICAL SPINE WITHOUT CONTRAST TECHNIQUE: Multidetector CT imaging of the cervical spine was performed without intravenous contrast. Multiplanar CT image reconstructions were also generated. COMPARISON:  None. FINDINGS: Alignment: Alignment is anatomic. Skull base and vertebrae: No acute fracture. No primary bone lesion or focal pathologic process. Soft tissues and spinal canal: No prevertebral fluid or swelling. No visible canal hematoma. Disc levels: Postsurgical changes from discectomy and fusion spanning C5-6 and C6-7. Moderate spondylosis at C4-5. Diffuse facet hypertrophy greatest at C2-3, C3-4, and C4-5. Right predominant neural foraminal  narrowing at C2-3, with symmetrical neural foraminal encroachment at C3-4 and C4-5. Upper chest: Airway is patent. Emphysematous changes are seen at the lung apices. Other: Reconstructed images demonstrate no additional findings. IMPRESSION: 1. No acute cervical spine fracture. 2. Postsurgical changes from discectomy and fusion at C5-6 and C6-7. 3. Prominent spondylosis and facet hypertrophy from C2-3 through C4-5. Electronically Signed   By: Randa Ngo M.D.   On: 11/18/2020 20:58   CT Maxillofacial Wo Contrast  Result Date: 11/18/2020 CLINICAL DATA:  [place], facial trauma EXAM: CT MAXILLOFACIAL WITHOUT CONTRAST TECHNIQUE: Multidetector CT imaging of the maxillofacial structures was performed. Multiplanar CT image reconstructions were also generated. COMPARISON:  None. FINDINGS: Osseous: Minimally displaced and comminuted right-sided nasal bone fracture. No other acute bony abnormalities. Orbits: Negative. No traumatic or inflammatory finding. Sinuses: Clear. Soft tissues: Midline frontal scalp hematoma. Remaining soft tissues are unremarkable. Limited intracranial: No significant or unexpected finding. IMPRESSION: 1. Minimally displaced right-sided nasal bone fracture. 2. Midline frontal scalp hematoma. Electronically Signed   By: Randa Ngo M.D.   On: 11/18/2020 20:53    ____________________________________________   PROCEDURES Procedures  ____________________________________________  DIFFERENTIAL DIAGNOSIS   Intracranial hemorrhage, C-spine fracture, nasal fracture, maxillary fracture  CLINICAL IMPRESSION / ASSESSMENT AND PLAN / ED COURSE  Medications ordered in the ED: Medications - No data to display  Pertinent labs & imaging results that were available during my care of the patient were reviewed by me and considered in my medical decision making (see chart for details).  JOSALYN DETTMANN was evaluated in Emergency Department on 11/18/2020 for the symptoms described in the history  of present illness. She was evaluated in the context of the global COVID-19 pandemic, which necessitated consideration that the patient might be at risk for infection with the SARS-CoV-2 virus that causes COVID-19. Institutional protocols and algorithms that pertain to the evaluation of patients at risk for COVID-19 are in a state of rapid change based on information released by regulatory bodies including the CDC and federal and state organizations. These policies and algorithms were  followed during the patient's care in the ED.   Patient presents after mechanical fall with facial trauma.  Has a large forehead hematoma.  No laceration.  CT scans negative for intracranial hemorrhage or C-spine fracture.  There is a small nasal bone fracture.  Counseled patient on nasal precautions, follow-up with ENT.  Vital signs are normal, mental status is normal, stable for discharge home.      ____________________________________________   FINAL CLINICAL IMPRESSION(S) / ED DIAGNOSES    Final diagnoses:  Scalp hematoma, initial encounter  Closed fracture of nasal bone, initial encounter     ED Discharge Orders    None      Portions of this note were generated with dragon dictation software. Dictation errors may occur despite best attempts at proofreading.   Carrie Mew, MD 11/18/20 2258

## 2020-11-18 NOTE — ED Notes (Signed)
Patient transported to CT 

## 2020-11-18 NOTE — ED Triage Notes (Signed)
Pt to ED via EMS from home c/o fall tonight.  States was dusting and bent over and left leg gave out on her causing her to fall forward and hit face on carpeted floor.  Denies LOC.  Hx of 4 strokes and is off balance with frequent falls at baseline according to patient.  Denies pain at this time but does have small abrasion to bridge of nose, hematoma to center forehead.  Pt on Plavix, has had minor bleeding from inside nose since the fall but non bleeding at this time.  EMS vitals 158/79, 98% RA, 80 HR, 88 CBG.

## 2020-11-19 ENCOUNTER — Inpatient Hospital Stay: Payer: Medicaid Other

## 2020-11-19 ENCOUNTER — Inpatient Hospital Stay: Payer: Medicaid Other | Admitting: Oncology

## 2020-11-19 DIAGNOSIS — R803 Bence Jones proteinuria: Secondary | ICD-10-CM

## 2020-11-24 ENCOUNTER — Telehealth: Payer: Self-pay | Admitting: Family Medicine

## 2020-11-24 NOTE — Telephone Encounter (Signed)
Copied from [place] [phone]. Topic: Medicare AWV >> Nov 24, 2020  1:03 PM Cher Nakai R wrote: Reason for CRM:   No answer unable to leave a  message for patient to call back and schedule the Medicare Annual Wellness Visit (AWV) virtually or by telephone.  Last AWV 11/04/2019  Please schedule at anytime with [place].  40 minute appointment  Any questions, please call me at [phone]

## 2020-11-25 ENCOUNTER — Telehealth: Payer: Self-pay | Admitting: General Practice

## 2020-11-25 ENCOUNTER — Telehealth: Payer: Self-pay

## 2020-11-25 NOTE — Telephone Encounter (Signed)
  Chronic Care Management   Outreach Note  11/25/2020 Name: Kendra [name] MRN: [id number] DOB: 1946/08/23  Referred by: [name], FNP Reason for referral : Appointment (RNCM: Follow up for Chronic Disease Management and Care Coordination Needs )   An unsuccessful telephone outreach was attempted today. The patient was referred to the case management team for assistance with care management and care coordination.   Follow Up Plan: Telephone follow up appointment with care management team member scheduled for:  [name] RN, MSN, [facility] Mobile: [phone]

## 2020-12-02 NOTE — Telephone Encounter (Signed)
Pt has been rescheduled. 

## 2020-12-07 ENCOUNTER — Telehealth: Payer: Self-pay | Admitting: *Deleted

## 2020-12-07 NOTE — Telephone Encounter (Signed)
  Sched pt for B12 inj monthly x 1 now, and another one in 4 weeks. lab cbc cmp, free light chain ration, serum protein electrophoresis, 1 weeks prior to MD visit in 2 months. + B12 injeciton per Dr. Tasia Catchings.      Pt has been sched as requested per MD..  However,I have been unable to reach pt by phone.Rene Paci called all pt contact numbers  listed. And (no answer) A detailed message was left on pt son Everley Evora VM making him aware of all his mother's sched upcoming appts and to have her contact the office back to let us know if the sched dates and times works best for her.

## 2020-12-08 ENCOUNTER — Telehealth: Payer: Self-pay | Admitting: Oncology

## 2020-12-08 ENCOUNTER — Other Ambulatory Visit: Payer: Self-pay

## 2020-12-08 ENCOUNTER — Ambulatory Visit (INDEPENDENT_AMBULATORY_CARE_PROVIDER_SITE_OTHER): Payer: Medicare Other | Admitting: Pharmacist

## 2020-12-08 DIAGNOSIS — E118 Type 2 diabetes mellitus with unspecified complications: Secondary | ICD-10-CM

## 2020-12-08 DIAGNOSIS — I10 Essential (primary) hypertension: Secondary | ICD-10-CM

## 2020-12-08 DIAGNOSIS — Z794 Long term (current) use of insulin: Secondary | ICD-10-CM

## 2020-12-08 DIAGNOSIS — J449 Chronic obstructive pulmonary disease, unspecified: Secondary | ICD-10-CM

## 2020-12-08 DIAGNOSIS — J432 Centrilobular emphysema: Secondary | ICD-10-CM

## 2020-12-08 MED ORDER — ALBUTEROL SULFATE HFA 108 (90 BASE) MCG/ACT IN AERS: INHALATION_SPRAY | RESPIRATORY_TRACT | 1 refills | Status: DC

## 2020-12-08 NOTE — Patient Instructions (Signed)
Visit Information  PATIENT GOALS: Goals Addressed            This Visit's Progress   . Pharmacy - Patient Goals       -Please continue to keep log of home blood sugar results and have for [place] to review during our calls -Please continue to keep log of blood pressure results and have for [place] to review during our calls  Our goal bad cholesterol, or LDL, is less than 70 . This is why it is important to continue taking your atorvastatin.  I look forward to talking to you during our next telephone appointment. Please call if you need something sooner!  [name], PharmD, [place] [phone]        The patient verbalized understanding of instructions, educational materials, and care plan provided today and declined offer to receive copy of patient instructions, educational materials, and care plan.   Telephone follow up appointment with care management team member scheduled for: 01/05/2021 at 12:30 PM  [name], PharmD, [place] [phone]

## 2020-12-08 NOTE — Telephone Encounter (Signed)
Done..  I spoke with pt and made her aware of all her sched upcoming appts per Dr. Tasia Catchings. A reminder letter will be mailed out also

## 2020-12-08 NOTE — Telephone Encounter (Signed)
Patient called requesting to schedule an appointment.  She is currently scheduled for an injection on 3/28.  Routing to the team scheduler for follow up.

## 2020-12-08 NOTE — Chronic Care Management (AMB) (Signed)
Chronic Care Management Pharmacy Note  12/08/2020 Name:  Kendra [name] MRN:  [id number] DOB:  09-07-1946  Subjective: Kendra [name] is an 75 y.o. year old female who is a primary patient of [name], [name], FNP.  The CCM team was consulted for assistance with disease management and care coordination needs.    Engaged with patient by telephone for follow up visit in response to provider referral for pharmacy case management and/or care coordination services.   Consent to Services:  The patient was given information about Chronic Care Management services, agreed to services, and gave verbal consent prior to initiation of services.  Please see initial visit note for detailed documentation.   Patient Care Team: Malfi, [name], FNP as PCP - General (Family Medicine) Steele Sizer, MD as Attending Physician (Family Medicine) Christene Lye, MD (General Surgery) [place], Virl Diamond, [facility] as Pharmacist Greg Cutter, LCSW as Social Worker (Licensed Clinical Social Worker) Vanita Ingles, RN as Case Manager ([place]) Malfi, [name], FNP as Nurse Practitioner (Family Medicine)  Recent office visits: none  Hospital visits: None in previous 6 months  Patient seen in [facility] ED on 3/3 for Scalp hematoma.   Per encounter notes, patient had mechanical fall with facial trauma.   Patient advised to follow-up with ENT for nasal bone fracture  Objective:  Lab Results  Component Value Date   CREATININE 0.66 07/20/2020   CREATININE 0.83 06/14/2020   CREATININE 0.72 02/06/2020    Lab Results  Component Value Date   HGBA1C 5.8 (A) 09/23/2020       Component Value Date/Time   CHOL 114 06/14/2020 0929   CHOL 177 06/07/2015 1215   CHOL 141 09/02/2014 0440   TRIG 82 06/14/2020 0929   TRIG 155 09/02/2014 0440   HDL 60 06/14/2020 0929   HDL 56 06/07/2015 1215   HDL 37 (L) 09/02/2014 0440   CHOLHDL 1.9 06/14/2020 0929   VLDL 16 10/17/2019 0606    VLDL 31 09/02/2014 0440   LDLCALC 38 06/14/2020 0929   LDLCALC 73 09/02/2014 0440    Hepatic Function Latest Ref Rng & Units 07/20/2020 06/14/2020 02/06/2020  Total Protein 6.5 - 8.1 g/dL 7.5 6.6 6.5  Albumin 3.5 - 5.0 g/dL 4.4 - -  AST 15 - 41 U/L $Remo'20 17 17  'MXZDZ$ ALT 0 - 44 U/L $Remo'13 17 15  'nRGGt$ Alk Phosphatase 38 - 126 U/L 67 - -  Total Bilirubin 0.3 - 1.2 mg/dL 0.4 0.5 0.3    Clinical ASCVD: Yes  The ASCVD Risk score Mikey Bussing DC Jr., et al., 2013) failed to calculate for the following reasons:   The patient has a prior MI or stroke diagnosis     Social History   Tobacco Use  Smoking Status Current Some Day Smoker  . Packs/day: 0.10  . Years: 61.00  . Pack years: 6.10  . Types: Cigarettes  Smokeless Tobacco Former Systems developer  . Types: Snuff  Tobacco Comment   .5 of 1 cigarette a day   BP Readings from Last 3 Encounters:  11/18/20 131/73  09/30/20 119/83  09/23/20 112/69   Pulse Readings from Last 3 Encounters:  11/18/20 80  09/30/20 76  09/23/20 81   Wt Readings from Last 3 Encounters:  11/18/20 123 lb (55.8 kg)  09/23/20 123 lb 9.6 oz (56.1 kg)  09/02/20 123 lb 6.4 oz (56 kg)    Assessment: Review of patient past medical history, allergies, medications, health status, including review of  consultants reports, laboratory and other test data, was performed as part of comprehensive evaluation and provision of chronic care management services.   SDOH:  (Social Determinants of Health) assessments and interventions performed: yes   CCM Care Plan  Allergies  Allergen Reactions  . Citalopram Nausea And Vomiting    Medications Reviewed Today    Reviewed by Day, Shelva Majestic, CMA (Certified Medical Assistant) on 10/01/20 at 1420  Med List Status: <None>  Medication Order Taking? Sig Documenting Provider Last Dose Status Informant  albuterol (VENTOLIN HFA) 108 (90 Base) MCG/ACT inhaler [id number] Yes INHALE 1-2 PUFFS EVERY 6 HOURS AS NEEDEDSHORTNESS OF BREATH AND WHEEZING Malfi, Jodelle Gross, FNP  Taking Active   aspirin 325 MG tablet [id number] Yes Take 325 mg by mouth daily. [provider] Taking Active   atorvastatin (LIPITOR) 40 MG tablet [id number] Yes TAKE 1 TABLET BY MOUTH ONCE DAILY Malfi, Jodelle Gross, FNP Taking Active   Blood Glucose Monitoring [facility] VERIO) w/Device [name] [id number] Yes USE AS DIRECTED [name], FNP Taking Active   buPROPion (WELLBUTRIN XL) 150 MG 24 hr tablet [id number] Yes TAKE 1 TABLET BY MOUTH ONCE DAILY Malfi, Jodelle Gross, FNP Taking Active   clopidogrel (PLAVIX) 75 MG tablet [id number] Yes TAKE 1 TABLET BY MOUTH ONCE DAILY Malfi, Jodelle Gross, FNP Taking Active   cyclobenzaprine (FLEXERIL) 5 MG tablet [id number] Yes Take 0.5-1 tablets (2.5-5 mg total) by mouth 3 (three) times daily as needed for muscle spasms. [name], FNP Taking Active   glucose blood [facility] VERIO) test strip [id number] Yes Use as instructed Malfi, Jodelle Gross, FNP Taking Active   Lancet Devices (ONE TOUCH DELICA LANCING DEV) MISC [id number] Yes 1 Device by Does not apply route 2 (two) times daily. Malfi, Jodelle Gross, FNP Taking Active   lisinopril (ZESTRIL) 5 MG tablet [id number] Yes Take 1 tablet (5 mg total) by mouth daily. [name], FNP Taking Active   metFORMIN (GLUCOPHAGE-XR) 500 MG 24 hr tablet [id number] Yes TAKE 1 TABLET BY MOUTH TWICE DAILY BEFORE A MEAL Malfi, Jodelle Gross, FNP Taking Active   nicotine polacrilex (COMMIT) 4 MG lozenge [id number] No Take 4 mg by mouth as needed for smoking cessation.  Patient not taking: Reported on 10/01/2020   [provider] Not Taking Active Self  ondansetron (ZOFRAN) 4 MG tablet [id number] Yes Take 2 tablets (8 mg total) by mouth every 8 (eight) hours as needed for nausea or vomiting. [name], FNP Taking Active   OneTouch Delica Lancets 33G MISC [id number] Yes 1 Device by Does not apply route 2 (two) times daily. Galen Manila, NP Taking Active Self  traZODone (DESYREL) 50 MG tablet [id number] Yes Take 0.5-1  tablets (25-50 mg total) by mouth at bedtime as needed for sleep. [name], FNP Taking Active   TRELEGY ELLIPTA 100-62.5-25 MCG/INH AEPB [id number] Yes INHALE 1 PUFF INTO THE LUNGS ONCE DAILY Malfi, Jodelle Gross, FNP Taking Active           Patient Active Problem List   Diagnosis Date Noted  . Loss of taste 09/23/2020  . Vitamin B12 deficiency 08/20/2020  . Personal history of tobacco use, presenting hazards to health 08/20/2020  . Normocytic anemia 06/15/2020  . Colon cancer screening 05/25/2020  . Right hand pain 01/29/2020  . Visit for suture removal 01/23/2020  . Encounter for screening colonoscopy 01/23/2020  . Carotid artery stenosis 12/30/2019  . 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 Weakness  of left upper extremity 10/16/2019  . Left-sided weakness 10/16/2019  . Hypertension associated with diabetes (HCC) 12/19/2018  . Chronic midline low back pain without sciatica 05/07/2018  . Radiculopathy of leg 04/01/2017  . Calcification of aorta (HCC) 09/27/2016  . Early satiety 07/26/2016  . CMC arthritis, thumb, degenerative 07/16/2015  . Weight loss 06/07/2015  . Tenosynovitis of wrist 06/07/2015  . Protein calorie malnutrition (HCC) 06/07/2015  . Diabetes mellitus type 2, controlled, with complications (HCC) 06/06/2015  . Cataract 03/30/2015  . Other and unspecified disc disorder of cervical region 03/30/2015  . Insomnia 03/30/2015  . Centrilobular emphysema (HCC) 03/30/2015  . Clinical depression 03/30/2015  . Hyperlipidemia associated with type 2 diabetes mellitus (HCC) 03/30/2015  . Gastro-esophageal reflux disease without esophagitis 03/30/2015  . Increased thickness of nail 03/30/2015  . History of cerebrovascular accident with residual deficit 03/30/2015  . Osteoarthritis 03/30/2015  . Osteopenia 03/30/2015  . Allergic rhinitis 03/30/2015  . B12 deficiency 03/30/2015  . Tobacco use disorder 03/30/2015  . Vitamin D  deficiency 03/30/2015  . Mycotic toenails 08/12/2014  . Diabetic peripheral neuropathy associated with type 2 diabetes mellitus (HCC) 08/12/2014    Immunization History  Administered Date(s) Administered  . Fluad Quad(high Dose 65+) 05/21/2019  . Influenza Split 06/20/2012  . Influenza, High Dose Seasonal PF 07/26/2016, 06/14/2017, 07/31/2018  . Influenza, Seasonal, Injecte, Preservative Fre 08/04/2010  . Influenza,inj,Quad PF,6+ Mos 06/10/2013, 05/26/2014, 06/07/2015  . Influenza-Unspecified 05/26/2014, 06/18/2018  . PFIZER(Purple Top)SARS-COV-2 Vaccination 03/02/2020, 03/30/2020  . Pneumococcal Conjugate-13 10/31/2007, 10/21/2014  . Pneumococcal Polysaccharide-23 10/31/2007, 06/20/2012, 07/31/2018  . Tdap 08/04/2010  . Zoster 06/10/2013    Conditions to be addressed/monitored: HTN, T2DM, COPD  Care Plan : PharmD - Medication Mgmt  Updates made by Daphane Shepherd, RPH since 12/08/2020 12:00 AM    Problem: Disease Progression     Long-Range Goal: Disease Progression Prevented or Minimized   Start Date: 11/01/2020  Expected End Date: 01/30/2021  This Visit's Progress: On track  Recent Progress: On track  Priority: High  Note:   Current Barriers:  . Financial Barriers . Limited social support  Pharmacist Clinical Goal(s):  [name] Over the next 90 days, patient will maintain adherence to monitoring guidelines and medication adherence to achieve therapeutic efficacy through collaboration with PharmD and provider.   Interventions: . 1:1 collaboration with [name], DO regarding development and update of comprehensive plan of care as evidenced by provider attestation and co-signature . Inter-disciplinary care team collaboration (see longitudinal plan of care) . Perform chart review o Patient seen in [facility] ED on 3/3 for Scalp hematoma.  - Per encounter notes, patient had mechanical fall with facial trauma.  - Patient advised to follow-up with ENT for  nasal bone fracture o PCP office contacted patient on 3/9 regarding scheduling of Medicare AWV, but unable to reach patient o [facility] called patient yesterday (3/22) regarding scheduling for B12 injections and provider visit, but unable to reach patient . Today reach patient by phone. Patient reports she has been having trouble with her phone recently . Denies having called to schedule post-ED visit follow up with ENT, but confirms has phone number  Coordination of care   Follow up with patient regarding missed calls from [facility] and PCP office for AWV  Encourage patient to call today regarding appointments with ENT, Cancer Center and for AWV  Encourage patient to then call to schedule transportation for upcoming appointments including with Cancer Center, ENT and  PCP office  Reports will call [place] to schedule transportation. Confirm patient also has phone numbers for other transportation resources, including [employer] and [employer], as provided by Care Guide  Patient states she will also obtain COVID-19 booster  Hypertension . Current treatment: lisinopril 5 mg once daily from pill pack . Reports recent home blood pressure readings: o 3/22: 108/75, HR 78 . Encourage patient to continue to monitor at home, keep log and bring record with her to medical appointments  Type 2 Diabetes . Controlled; current treatment: metformin ER 500 mg twice daily from pill pack . Reports recent fasting readings: o Yesterday: 94 . Counsel on importance of having regular well-balanced meals and limiting carbohydrate portion sizes . Encourage patient to continue to monitor at home, keep log and bring record with her to medical appointments  Hyperlipidemia: . Controlled; current treatment: atorvastatin 40 mg daily  Medication Adherence . Confirms continues to take medications as directed from pill packs as received from [facility]  o Reports next pill  pack to be delivered today . Reports using maintenance Trelegy inhaler daily (and rinsing mouth out after each use) and rescue (albuterol) as needed as directed.   Smoking Cessation . Patient reports not ready to quit today . Today reports she is currently smoking ~1/2 cigarette/day . Have discussed beneifts of complete smoking cessation   Patient Goals/Self-Care Activities . Over the next 90 days, patient will:  - take medications as prescribed  Using pill packaging from [facility] - check glucose, document, and provide at future appointments - check blood pressure, document, and provide at future appointments  Follow Up Plan: Telephone follow up appointment with care management team member scheduled for: 01/05/2021 at 12:30 PM      Medication Assistance: None required.  Patient affirms current coverage meets needs.  Patient's preferred pharmacy is:  [place], [place] [address] Phone: [phone] Fax: [phone]  [id number] - [place], [address] [place] [address] Phone: [phone] Fax: [phone]  [facility], [place] [address] Phone: [phone] Fax: [phone]  Uses pill box? Using pill pack  Follow Up:  Patient agrees to Care Plan and Follow-up.  Plan: Telephone follow up appointment with care management team member scheduled for:  01/05/2021 at 12:30 PM  [name], PharmD, [place] [phone]

## 2020-12-09 NOTE — Telephone Encounter (Signed)
Patient rescheduled

## 2020-12-09 NOTE — Telephone Encounter (Signed)
Please reschedule SG RN CM

## 2020-12-13 ENCOUNTER — Inpatient Hospital Stay: Payer: Medicare Other | Attending: Oncology

## 2020-12-14 ENCOUNTER — Ambulatory Visit: Payer: Medicare Other | Admitting: Licensed Clinical Social Worker

## 2020-12-14 DIAGNOSIS — I10 Essential (primary) hypertension: Secondary | ICD-10-CM

## 2020-12-14 DIAGNOSIS — E1169 Type 2 diabetes mellitus with other specified complication: Secondary | ICD-10-CM

## 2020-12-14 DIAGNOSIS — E118 Type 2 diabetes mellitus with unspecified complications: Secondary | ICD-10-CM

## 2020-12-14 DIAGNOSIS — F324 Major depressive disorder, single episode, in partial remission: Secondary | ICD-10-CM | POA: Diagnosis not present

## 2020-12-14 DIAGNOSIS — Z599 Problem related to housing and economic circumstances, unspecified: Secondary | ICD-10-CM

## 2020-12-14 DIAGNOSIS — J432 Centrilobular emphysema: Secondary | ICD-10-CM

## 2020-12-14 DIAGNOSIS — E785 Hyperlipidemia, unspecified: Secondary | ICD-10-CM

## 2020-12-14 DIAGNOSIS — Z59819 Housing instability, housed unspecified: Secondary | ICD-10-CM

## 2020-12-14 DIAGNOSIS — Z794 Long term (current) use of insulin: Secondary | ICD-10-CM

## 2020-12-14 NOTE — Chronic Care Management (AMB) (Signed)
Chronic Care Management    Clinical Social Work Note  12/14/2020 Name: RUTHE ROEMER MRN: [id number] DOB: 07/29/1946  Kendra [name] Yordy is a 75 y.o. year old female who is a primary care patient of [name], [name], FNP. The CCM team was consulted to assist the patient with chronic disease management and/or care coordination needs related to: Transportation Needs , [employer]  and Level of Care Concerns.   Engaged with patient by telephone for follow up visit in response to provider referral for social work chronic care management and care coordination services.   Consent to Services:  The patient was given the following information about Chronic Care Management services today, agreed to services, and gave verbal consent: 1. CCM service includes personalized support from designated clinical staff supervised by the primary care provider, including individualized plan of care and coordination with other care providers 2. 24/7 contact phone numbers for assistance for urgent and routine care needs. 3. Service will only be billed when office clinical staff spend 20 minutes or more in a month to coordinate care. 4. Only one practitioner may furnish and bill the service in a calendar month. 5.The patient may stop CCM services at any time (effective at the end of the month) by phone call to the office staff. 6. The patient will be responsible for cost sharing (co-pay) of up to 20% of the service fee (after annual deductible is met). Patient agreed to services and consent obtained.  Patient agreed to services and consent obtained.   Assessment: Review of patient past medical history, allergies, medications, and health status, including review of relevant consultants reports was performed today as part of a comprehensive evaluation and provision of chronic care management and care coordination services.     SDOH (Social Determinants of Health) assessments and interventions performed:    Advanced  Directives Status: See Care Plan for related entries.  CCM Care Plan  Allergies  Allergen Reactions  . Citalopram Nausea And Vomiting    Outpatient Encounter Medications as of 12/14/2020  Medication Sig  . albuterol (VENTOLIN HFA) 108 (90 Base) MCG/ACT inhaler Inhale 1-2 Puffs every 6 hours as needed Shortness of Breath and wheeze  . aspirin 325 MG tablet Take 325 mg by mouth daily.  [name] atorvastatin (LIPITOR) 40 MG tablet TAKE 1 TABLET BY MOUTH BEDTIME  . Blood Glucose Monitoring Suppl (ONETOUCH VERIO) w/Device KIT USE AS DIRECTED  . buPROPion (WELLBUTRIN XL) 150 MG 24 hr tablet Take 1 tablet (150 mg total) by mouth daily.  . clopidogrel (PLAVIX) 75 MG tablet TAKE 1 TABLET BY MOUTH ONCE DAILY  . cyclobenzaprine (FLEXERIL) 5 MG tablet Take 0.5-1 tablets (2.5-5 mg total) by mouth 3 (three) times daily as needed for muscle spasms.  . Fluticasone-Umeclidin-Vilant (TRELEGY ELLIPTA) 100-62.5-25 MCG/INH AEPB INHALE 1 PUFF INTO THE LUNGS ONCE DAILY  . glucose blood (ONETOUCH VERIO) test strip Use as instructed  . Lancet Devices (ONE TOUCH DELICA LANCING DEV) MISC 1 Device by Does not apply route 2 (two) times daily.  [name] lisinopril (ZESTRIL) 5 MG tablet Take 1 tablet (5 mg total) by mouth daily.  . metFORMIN (GLUCOPHAGE-XR) 500 MG 24 hr tablet Take 1 tablet (500 mg total) by mouth 2 (two) times daily before a meal.  . nicotine polacrilex (COMMIT) 4 MG lozenge Take 4 mg by mouth as needed for smoking cessation. (Patient not taking: Reported on 10/01/2020)  . ondansetron (ZOFRAN) 4 MG tablet Take 2 tablets (8 mg total) by mouth every 8 (eight)  hours as needed for nausea or vomiting.  [name] Lancets 51W MISC 1 Device by Does not apply route 2 (two) times daily.  . traZODone (DESYREL) 50 MG tablet Take 0.5-1 tablets (25-50 mg total) by mouth at bedtime as needed for sleep.   No facility-administered encounter medications on file as of 12/14/2020.    Patient Active Problem List   Diagnosis  Date Noted  . Loss of taste 09/23/2020  . Vitamin B12 deficiency 08/20/2020  . Personal history of tobacco use, presenting hazards to health 08/20/2020  . Normocytic anemia 06/15/2020  . Colon cancer screening 05/25/2020  . Right hand pain 01/29/2020  . Visit for suture removal 01/23/2020  . Encounter for screening colonoscopy 01/23/2020  . Carotid artery stenosis 12/30/2019  . 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 Weakness of left upper extremity 10/16/2019  . Left-sided weakness 10/16/2019  . Hypertension associated with diabetes ([place]) 12/19/2018  . Chronic midline low back pain without sciatica 05/07/2018  . Radiculopathy of leg 04/01/2017  . Calcification of aorta (HCC) 09/27/2016  . Early satiety 07/26/2016  . CMC arthritis, thumb, degenerative 07/16/2015  . Weight loss 06/07/2015  . Tenosynovitis of wrist 06/07/2015  . Protein calorie malnutrition ([place]) 06/07/2015  . Diabetes mellitus type 2, controlled, with complications ([place]) 25/85/2778  . Cataract 03/30/2015  . Other and unspecified disc disorder of cervical region 03/30/2015  . Insomnia 03/30/2015  . Centrilobular emphysema ([place]) 03/30/2015  . Clinical depression 03/30/2015  . Hyperlipidemia associated with type 2 diabetes mellitus ([place]) 03/30/2015  . Gastro-esophageal reflux disease without esophagitis 03/30/2015  . Increased thickness of nail 03/30/2015  . History of cerebrovascular accident with residual deficit 03/30/2015  . Osteoarthritis 03/30/2015  . Osteopenia 03/30/2015  . Allergic rhinitis 03/30/2015  . B12 deficiency 03/30/2015  . Tobacco use disorder 03/30/2015  . Vitamin D deficiency 03/30/2015  . Mycotic toenails 08/12/2014  . Diabetic peripheral neuropathy associated with type 2 diabetes mellitus ([place]) 08/12/2014    Care Plan : General Social Work (Adult)  Updates made by Greg Cutter, LCSW since 12/14/2020 12:00 AM    Problem: Coping Skills (General  Plan of Care)     Long-Range Goal: Coping Skills Enhanced   Start Date: 12/14/2020  Recent Progress: On track  Priority: Medium  Note:    Timeframe:  Long-Range Goal Priority:  Medium Start Date:   12/14/20                    Expected End Date: 03/16/21                Follow Up Date -01/26/21  - check out options for in-home help, long-term care or hospice - complete a living will - discuss my treatment options with the doctor or nurse - do one enjoyable thing every day - do something different, like talking to a new person or going to a new place, every day - learn something new by asking, reading and searching the Internet every day - make an audio or video recording for my loved ones - make shared treatment decisions with doctor - meditate daily - name a health care proxy (decision maker) - share memories using a picture album or scrapbook with my loved ones - spend time with a child every day, borrow one if I have to - spend time outdoors at least 3 times a week - strengthen or fix relationships with loved ones    Why is  this important?    Having a long-term illness can be scary.   It can also be stressful for you and your caregiver.   These steps may help.   Current Barriers:  . Financial constraints . Limited social support . ADL IADL limitations . Mental Health Concerns  . Social Isolation . Limited access to caregiver . Inability to perform ADL's independently . Inability to perform IADL's independently . Lacks knowledge of community resource: grief support resources within the area  Clinical Social Work Clinical Goal(s):  [name] Over the next 120 days, patient will work with SW to address concerns related to gaining additional support within the home and resource connection in order to maintain health and independency within the community  . Over the next 120 days, patient will demonstrate improved adherence to self care as evidenced by implementing healthy  self-care into her daily routine such as: attending all medical appointments, taking time for self-reflection, taking medications as prescribed, drinking water and daily exercise to improve mobility.  Interventions: . Patient interviewed and appropriate assessments performed . Patient reports that she is looking forward to receiving her food stamps as she struggles with affording groceries at times. Patient reports receiving over $100 in EBT per month. . Patient shares that her granddaughter has been providing additional support to her lately in terms of caregiving and running errands for her.  . Patient reports that she successfully received housing resource list that CCM LCSW and C3 Guide provided to her but has not had the chance to review or investigate list yet. CCM LCSW provided additional education on low income housing resources within her area. Patient continues to reside in her apartment with her daughter in law. Patient's son is expected to be released in December of 2022. Patient is looking forward to his return.  . Patient had two falls recently in her apartment. Patient reports that she fell on her face during both falls. One which led to an ED visit on 11/18/20 due to her injuries. Patient reports that the ED suggested that she follow up with ENT doctor as she injured part of her nose. Patient was encouraged to discuss this concern with PCP on 12/21/20. Patient reports that her legs continue to "give out" on her. Patient reports ongoing leg weakness and poor mobility.  . Patient was informed that current CCM LCSW will be leaving position next month and her next CCM Social Work follow up visit will be with another LCSW. Patient was appreciative of support provided and receptive to news . Education provided on how to implement appropriate self-care and coping tools into her daily routine. Patient was receptive to this education. . Patient reports that her feet continue to swell even though she  elevates them on a pillow regularly. Patient reports that her right toe is red and irritated. Patient shares that she put some pain relief ointment on it last night which helped. . Provided mental health counseling with regard to current stressors with finding stable housing. LCSW used active and reflective listening and implemented appropriate interventions to help suppport patient and her emotional needs. Advised patient to implement deep breathing/grounding/meditation/self-care exercises into her daily routine to combat stressors. Education provided.  [name] LCSW was informed that patient's sleep hygiene and routine is the same. She reports ongoing difficulty with staying asleep at night which affects her energy, mobility and motivation. Patient reports interest in gaining medication assistance for this concern. Patient ask for [facility] office number so that she can set up appointment  with PCP to discuss. LCSW provided education on healthy sleep hygiene and what that looks like. LCSW encouraged patient to implement a night time routine into her schedule that works best for her and that she is able to maintain. Advised patient to implement deep breathing/grounding/meditation/self-care exercises into her nightly routine to combat racing thoughts at night.   . Provided patient with information about mental health and financial support resources within her area. Patient denies needing this support at this time but was appreciative of education that was provided.  . Discussed plans with patient for ongoing care management follow up and provided patient with direct contact information for care management team . Patient reports that she has been managing her blood pressure very well and is proud of this accomplishment. LCSW provided education on healthy self-care and provide positive reinforcement for taking action to improve her overall health and symptoms.  . Assisted patient/caregiver with obtaining information about  health plan benefits . LCSW used active and reflective listening and implemented appropriate interventions to help suppport patient and her emotional needs. . Reviewed all upcoming medical appointments with patent. Patient has an upcoming PCP appointment on 12/21/20.  [name] Provided patient with information about LTC placement as patient already has Medicaid secondary insurance coverage. LCSW also provided patient with education on available transportation resources through [facility] and [facility]. Patient will need to contact her Medicaid caseworker at [place] to notify them that she wishes to sign up for transportation services. . Evaluated patient's stress level. Emotional Support provided due to ongoing health complications.  [name] 1:1 collaboration with PCP regarding development and update of comprehensive plan of care as evidenced by provider attestation and co-signature  . Discussed plans with patient for ongoing care management follow up and provided patient with direct contact information for care management team . Advised patient to contact CCM providers if she has any concerns in regards to her safety, housing or well-being. Patient reports being currently stable.  . Assisted patient/caregiver with obtaining information about health plan benefits . Provided education and assistance to client regarding Advanced Directives. . Provided education to patient/caregiver regarding level of care options. . Patient confirms to struggle with ongoing headaches which causes her stress/pain/fatigue. Emotional support provided and patient was receptive to this. .  Positive reinforcement provided for keeping up with all of her medical appointments in the past. However, patient has missed some appointments in the past because she did not call transportation 3 days in advance to set up arrangements.  . Patient reports that her ramp is still effectively working for her. . Patient declines any urgent case management needs at this  time. Patient reports being stable and having food at this time.  Patient Self Care Activities:  . Attends all scheduled provider appointments . Calls provider office for new concerns or questions  Please see past updates related to this goal by clicking on the "Past Updates" button in the selected goal       Follow Up Plan: SW will follow up with patient by phone over the next quarter      [name], [place], MSW, [email or web address] Phone: [phone]

## 2020-12-14 NOTE — Patient Instructions (Signed)
Licensed Clinical Social Worker Visit Information  Goals we discussed today:  Goals Addressed              This Visit's Progress   .  SW-I probably could use some extra support right now. (pt-stated)         Timeframe:  Long-Range Goal Priority:  Medium Start Date:   12/14/20                    Expected End Date: 03/16/21                Follow Up Date -01/26/21  - check out options for in-home help, long-term care or hospice - complete a living will - discuss my treatment options with the doctor or nurse - do one enjoyable thing every day - do something different, like talking to a new person or going to a new place, every day - learn something new by asking, reading and searching the Internet every day - make an audio or video recording for my loved ones - make shared treatment decisions with doctor - meditate daily - name a health care proxy (decision maker) - share memories using a picture album or scrapbook with my loved ones - spend time with a child every day, borrow one if I have to - spend time outdoors at least 3 times a week - strengthen or fix relationships with loved ones    Why is this important?    Having a long-term illness can be scary.   It can also be stressful for you and your caregiver.   These steps may help.   Current Barriers:  . Financial constraints . Limited social support . ADL IADL limitations . Mental Health Concerns  . Social Isolation . Limited access to caregiver . Inability to perform ADL's independently . Inability to perform IADL's independently . Lacks knowledge of community resource: grief support resources within the area  Clinical Social Work Clinical Goal(s):  [name] Over the next 120 days, patient will work with SW to address concerns related to gaining additional support within the home and resource connection in order to maintain health and independency within the community  . Over the next 120 days, patient will demonstrate  improved adherence to self care as evidenced by implementing healthy self-care into her daily routine such as: attending all medical appointments, taking time for self-reflection, taking medications as prescribed, drinking water and daily exercise to improve mobility.  Interventions: . Patient interviewed and appropriate assessments performed . Patient reports that she is looking forward to receiving her food stamps as she struggles with affording groceries at times. Patient reports receiving over $100 in EBT per month. . Patient shares that her granddaughter has been providing additional support to her lately in terms of caregiving and running errands for her.  . Patient reports that she successfully received housing resource list that CCM LCSW and C3 Guide provided to her but has not had the chance to review or investigate list yet. CCM LCSW provided additional education on low income housing resources within her area. Patient continues to reside in her apartment with her daughter in law. Patient's son is expected to be released in December of 2022. Patient is looking forward to his return.  . Patient had two falls recently in her apartment. Patient reports that she fell on her face during both falls. One which led to an ED visit on 11/18/20 due to her injuries. Patient reports that the ED suggested  that she follow up with ENT doctor as she injured part of her nose. Patient was encouraged to discuss this concern with PCP on 12/21/20. Patient reports that her legs continue to "give out" on her. Patient reports ongoing leg weakness and poor mobility.  . Patient was informed that current CCM LCSW will be leaving position next month and her next CCM Social Work follow up visit will be with another LCSW. Patient was appreciative of support provided and receptive to news . Education provided on how to implement appropriate self-care and coping tools into her daily routine. Patient was receptive to this  education. . Patient reports that her feet continue to swell even though she elevates them on a pillow regularly. Patient reports that her right toe is red and irritated. Patient shares that she put some pain relief ointment on it last night which helped. . Provided mental health counseling with regard to current stressors with finding stable housing. LCSW used active and reflective listening and implemented appropriate interventions to help suppport patient and her emotional needs. Advised patient to implement deep breathing/grounding/meditation/self-care exercises into her daily routine to combat stressors. Education provided.  [name] LCSW was informed that patient's sleep hygiene and routine is the same. She reports ongoing difficulty with staying asleep at night which affects her energy, mobility and motivation. Patient reports interest in gaining medication assistance for this concern. Patient ask for [facility] office number so that she can set up appointment with PCP to discuss. LCSW provided education on healthy sleep hygiene and what that looks like. LCSW encouraged patient to implement a night time routine into her schedule that works best for her and that she is able to maintain. Advised patient to implement deep breathing/grounding/meditation/self-care exercises into her nightly routine to combat racing thoughts at night.   . Provided patient with information about mental health and financial support resources within her area. Patient denies needing this support at this time but was appreciative of education that was provided.  . Discussed plans with patient for ongoing care management follow up and provided patient with direct contact information for care management team . Patient reports that she has been managing her blood pressure very well and is proud of this accomplishment. LCSW provided education on healthy self-care and provide positive reinforcement for taking action to improve her overall health  and symptoms.  . Assisted patient/caregiver with obtaining information about health plan benefits . LCSW used active and reflective listening and implemented appropriate interventions to help suppport patient and her emotional needs. . Reviewed all upcoming medical appointments with patent. Patient has an upcoming PCP appointment on 12/21/20.  [name] Provided patient with information about LTC placement as patient already has Medicaid secondary insurance coverage. LCSW also provided patient with education on available transportation resources through [facility] and [facility]. Patient will need to contact her Medicaid caseworker at [place] to notify them that she wishes to sign up for transportation services. . Evaluated patient's stress level. Emotional Support provided due to ongoing health complications.  [name] 1:1 collaboration with PCP regarding development and update of comprehensive plan of care as evidenced by provider attestation and co-signature  . Discussed plans with patient for ongoing care management follow up and provided patient with direct contact information for care management team . Advised patient to contact CCM providers if she has any concerns in regards to her safety, housing or well-being. Patient reports being currently stable.  . Assisted patient/caregiver with obtaining information about health plan benefits . Provided education and  assistance to client regarding Advanced Directives. . Provided education to patient/caregiver regarding level of care options. . Patient confirms to struggle with ongoing headaches which causes her stress/pain/fatigue. Emotional support provided and patient was receptive to this. .  Positive reinforcement provided for keeping up with all of her medical appointments in the past. However, patient has missed some appointments in the past because she did not call transportation 3 days in advance to set up arrangements.  . Patient reports that her ramp is still effectively  working for her. . Patient declines any urgent case management needs at this time. Patient reports being stable and having food at this time.  Patient Self Care Activities:  . Attends all scheduled provider appointments . Calls provider office for new concerns or questions  Please see past updates related to this goal by clicking on the "Past Updates" button in the selected goal        [name], [place], MSW, [email or web address] Phone: [phone]

## 2020-12-15 ENCOUNTER — Telehealth: Payer: Self-pay

## 2020-12-15 DIAGNOSIS — E1165 Type 2 diabetes mellitus with hyperglycemia: Secondary | ICD-10-CM

## 2020-12-15 DIAGNOSIS — E118 Type 2 diabetes mellitus with unspecified complications: Secondary | ICD-10-CM

## 2020-12-15 DIAGNOSIS — IMO0002 Reserved for concepts with insufficient information to code with codable children: Secondary | ICD-10-CM

## 2020-12-15 DIAGNOSIS — Z794 Long term (current) use of insulin: Secondary | ICD-10-CM

## 2020-12-15 MED ORDER — ONETOUCH VERIO VI STRP: ORAL_STRIP | 4 refills | Status: DC

## 2020-12-15 MED ORDER — ONETOUCH DELICA LANCETS 33G MISC: 1.0000 | Freq: Two times a day (BID) | 4 refills | Status: DC

## 2020-12-15 NOTE — Telephone Encounter (Signed)
Copied from [place] [id number]. Topic: General - Call Back - No Documentation >> Dec 15, 2020 11:50 AM Erick Blinks wrote: Reason for CRM: Pt called to report that she needs testing supplies for both her blood sugar and blood pressure. Wants to also know if glucerna is still there for her.   Best contact: [phone]

## 2020-12-16 NOTE — Telephone Encounter (Signed)
Pls fu with pt as she has called again wanting to know if she can come in and PU Glucerna. FU at [phone]. She also is still requesting the testing supplies as in note.

## 2020-12-21 ENCOUNTER — Ambulatory Visit (INDEPENDENT_AMBULATORY_CARE_PROVIDER_SITE_OTHER): Payer: Medicare Other | Admitting: Licensed Clinical Social Worker

## 2020-12-21 ENCOUNTER — Ambulatory Visit (INDEPENDENT_AMBULATORY_CARE_PROVIDER_SITE_OTHER): Payer: Medicare Other | Admitting: Unknown Physician Specialty

## 2020-12-21 ENCOUNTER — Other Ambulatory Visit: Payer: Self-pay

## 2020-12-21 ENCOUNTER — Ambulatory Visit
Admission: RE | Admit: 2020-12-21 | Discharge: 2020-12-21 | Disposition: A | Discharge status: Home / Self Care | Payer: Medicare Other | Source: Ambulatory Visit | Attending: Unknown Physician Specialty | Admitting: Unknown Physician Specialty

## 2020-12-21 ENCOUNTER — Ambulatory Visit
Admission: RE | Admit: 2020-12-21 | Discharge: 2020-12-21 | Disposition: A | Discharge status: Home / Self Care | Payer: Medicare Other | Attending: Unknown Physician Specialty | Admitting: Unknown Physician Specialty

## 2020-12-21 ENCOUNTER — Encounter: Payer: Self-pay | Admitting: Unknown Physician Specialty

## 2020-12-21 VITALS — BP 96/57 | HR 84 | Temp 97.5°F | Ht 63.0 in | Wt 117.4 lb

## 2020-12-21 DIAGNOSIS — R634 Abnormal weight loss: Secondary | ICD-10-CM

## 2020-12-21 DIAGNOSIS — IMO0002 Reserved for concepts with insufficient information to code with codable children: Secondary | ICD-10-CM

## 2020-12-21 DIAGNOSIS — R531 Weakness: Secondary | ICD-10-CM | POA: Diagnosis not present

## 2020-12-21 DIAGNOSIS — Z794 Long term (current) use of insulin: Secondary | ICD-10-CM

## 2020-12-21 DIAGNOSIS — E1165 Type 2 diabetes mellitus with hyperglycemia: Secondary | ICD-10-CM | POA: Diagnosis present

## 2020-12-21 DIAGNOSIS — E118 Type 2 diabetes mellitus with unspecified complications: Secondary | ICD-10-CM

## 2020-12-21 DIAGNOSIS — G4700 Insomnia, unspecified: Secondary | ICD-10-CM | POA: Diagnosis not present

## 2020-12-21 DIAGNOSIS — R296 Repeated falls: Secondary | ICD-10-CM

## 2020-12-21 DIAGNOSIS — F324 Major depressive disorder, single episode, in partial remission: Secondary | ICD-10-CM

## 2020-12-21 LAB — POCT UA - MICROALBUMIN: Microalbumin Ur, POC: NEGATIVE mg/L

## 2020-12-21 LAB — POCT GLYCOSYLATED HEMOGLOBIN (HGB A1C): Hemoglobin A1C: 5.8 % — AB (ref 4.0–5.6)

## 2020-12-21 MED ORDER — TRAZODONE HCL 50 MG PO TABS: 25.0000 mg | ORAL_TABLET | Freq: Every evening | ORAL | 1 refills | Status: DC | PRN

## 2020-12-21 NOTE — Assessment & Plan Note (Signed)
Patient takes 50 mg of Trazodone nightly for sleep. Entered refill for Trazodone.

## 2020-12-21 NOTE — Patient Instructions (Addendum)
Stop Lisinopril and Metformin

## 2020-12-21 NOTE — Progress Notes (Addendum)
BP (!) 96/57 (BP Location: Left Arm, Patient Position: Sitting, Cuff Size: Normal)   Pulse 84   Temp (!) 97.5 F (36.4 C) (Temporal)   Ht 5\' 3"  (1.6 m)   Wt 117 lb 6.4 oz (53.3 kg)   SpO2 100%   BMI 20.80 kg/m    Subjective:    Patient ID: Kendra [name], female    DOB: 09/23/1945, 75 y.o.   MRN: [id number]  HPI: Kendra [name] is a 75 y.o. female  Chief Complaint  Patient presents with  . Diabetes    Pt needs a refill for trazadone, sleeping medication. Pt had a fall this morning she slid off the walk way. She fell forward and not hurt anything this morning.   Diabetes Patient states she takes her blood sugar once a day in the mornings and her fasting blood sugar is normally around 100. She watches her diet and sticks to low carbohydrates. Due to mobility issues, patient does not exercise. Denies nausea/vomitting and diarrhea.   Weight loss  Patient weighs 117 lbs today, down from 123 lbs in January 2022. Patient denies change in appetite.   Left-sided weakness Patient states she fell this morning off of her sidewalk. Denies hitting head and states she is not hurting anywhere. States she has had a difficult time walking since a stroke last year. Having a hard time with weakness on left side and was working with physical therapy. Patient was given a walker and cane but does not use either. States she does not have handrails on porch. They were going to be installed but she did not want them because of the bad neighborhood she [place] in.   Insomnia Patient states she only gets about 3 hours of sleep each night even with the use of Trazodone. States she is taking a whole pill (50 mg). Says that her neighborhood is loud at all times of night and is asking for a refill of Trazodone.     Relevant past medical, surgical, family and social history reviewed and updated as indicated. Interim medical history since our last visit reviewed. Allergies and medications reviewed and  updated.  Review of Systems  Constitutional: Positive for fatigue and unexpected weight change. Negative for appetite change, chills and fever.  Respiratory: Negative for cough.        Dyspnea on exertion.  Cardiovascular: Negative for chest pain, palpitations and leg swelling.  Gastrointestinal: Negative for diarrhea, nausea and vomiting.  Endocrine: Negative for polydipsia and polyphagia.  Musculoskeletal: Positive for gait problem.  Skin: Negative.   Neurological: Positive for dizziness and weakness.       Left-sided weakness    Per HPI unless specifically indicated above     Objective:    BP (!) 96/57 (BP Location: Left Arm, Patient Position: Sitting, Cuff Size: Normal)   Pulse 84   Temp (!) 97.5 F (36.4 C) (Temporal)   Ht 5\' 3"  (1.6 m)   Wt 117 lb 6.4 oz (53.3 kg)   SpO2 100%   BMI 20.80 kg/m   Wt Readings from Last 3 Encounters:  12/21/20 117 lb 6.4 oz (53.3 kg)  11/18/20 123 lb (55.8 kg)  09/23/20 123 lb 9.6 oz (56.1 kg)    Physical Exam Constitutional:      General: She is not in acute distress.    Appearance: She is ill-appearing.  HENT:     Head: Normocephalic and atraumatic.  Cardiovascular:     Rate and Rhythm: Normal rate and regular  rhythm.     Pulses: Normal pulses.  Pulmonary:     Effort: Pulmonary effort is normal.     Comments: Decreased breath sounds Abdominal:     General: Bowel sounds are normal.     Palpations: Abdomen is soft.  Musculoskeletal:     Left hand: Decreased strength.     Left foot: Decreased range of motion.  Skin:    General: Skin is dry.     Findings: No rash.  Neurological:     Mental Status: She is alert and oriented to person, place, and time.     Motor: Weakness present.     Comments: Left-sided weakness.  Psychiatric:        Mood and Affect: Mood normal.        Behavior: Behavior normal.     Results for orders placed or performed in visit on 12/21/20  POCT HgB A1C  Result Value Ref Range   Hemoglobin A1C  5.8 (A) 4.0 - 5.6 %   HbA1c POC (<> result, manual entry)     HbA1c, POC (prediabetic range)     HbA1c, POC (controlled diabetic range)        Assessment & Plan:   Problem List Items Addressed This Visit      Nervous and Auditory   Left-sided weakness    Patient has had multiple falls with most recent as of this morning. Denies hitting head or pain. Patient having difficulty ambulating and does not use cane due to "missing screw." Discussed safety issues at home including no rails on porch steps. Patient needs new cane and lift chair. Referring to social work for help with obtaining these items.       Relevant Orders   AMB Referral to [facility]     Other   Insomnia    Patient takes 50 mg of Trazodone nightly for sleep. Entered refill for Trazodone.       Relevant Medications   traZODone (DESYREL) 50 MG tablet   Weight loss    Patient has lost 6 lbs since January 2022. Denies change in appetite. Patient is a current smoker. Patient already has order for low dose CT. Ordered chest x-ray, CBC, CMP, Lipid panel, and TSH today. Will follow-up in 2 weeks.       Relevant Orders   DG Chest 2 View   CBC with Differential/Platelet   Comprehensive metabolic panel   Lipid panel   TSH   AMB Referral to [place]    Other Visit Diagnoses    Uncontrolled type 2 diabetes mellitus with insulin therapy ([place])    -  Primary   Hgb A1c is 5.8% today. Patient monitoring blood sugar at home and fasting is in 100s. Will stop Metformin at this point and follow up in 2 weeks.    Relevant Orders   POCT HgB A1C (Completed)   DG Chest 2 View   Falls frequently       Patient having recurring falls. Blood pressures low in office. Orthostatics normal. Discontinue Lisinopril 5 mg. Discussed use of ambulatory devices.       Follow up plan: Return in about 2 weeks (around 01/04/2021).

## 2020-12-21 NOTE — Assessment & Plan Note (Addendum)
Patient has had multiple falls with most recent as of this morning. Denies hitting head or pain. Patient having difficulty ambulating and does not use cane due to "missing screw." Discussed safety issues at home including no rails on porch steps. Patient needs new cane and lift chair. Referring to social work for help with obtaining these items.

## 2020-12-21 NOTE — Chronic Care Management (AMB) (Signed)
Chronic Care Management    Clinical Social Work Note  12/21/2020 Name: Kendra [name] MRN: [id number] DOB: 1946/07/18  Kendra [name] is a 75 y.o. year old female who is a primary care patient of [name], [name], FNP. The CCM team was consulted to assist the patient with chronic disease management and/or care coordination needs related to: Level of Care Concerns.   Engaged with patient by telephone for follow up visit in response to provider referral for social work chronic care management and care coordination services.   Consent to Services:  The patient was given the following information about Chronic Care Management services today, agreed to services, and gave verbal consent: 1. CCM service includes personalized support from designated clinical staff supervised by the primary care provider, including individualized plan of care and coordination with other care providers 2. 24/7 contact phone numbers for assistance for urgent and routine care needs. 3. Service will only be billed when office clinical staff spend 20 minutes or more in a month to coordinate care. 4. Only one practitioner may furnish and bill the service in a calendar month. 5.The patient may stop CCM services at any time (effective at the end of the month) by phone call to the office staff. 6. The patient will be responsible for cost sharing (co-pay) of up to 20% of the service fee (after annual deductible is met). Patient agreed to services and consent obtained.  Patient agreed to services and consent obtained.   Assessment: Review of patient past medical history, allergies, medications, and health status, including review of relevant consultants reports was performed today as part of a comprehensive evaluation and provision of chronic care management and care coordination services.     SDOH (Social Determinants of Health) assessments and interventions performed:    Advanced Directives Status: See Care Plan for related  entries.  CCM Care Plan  Allergies  Allergen Reactions  . Citalopram Nausea And Vomiting    Outpatient Encounter Medications as of 12/21/2020  Medication Sig  . albuterol (VENTOLIN HFA) 108 (90 Base) MCG/ACT inhaler Inhale 1-2 Puffs every 6 hours as needed Shortness of Breath and wheeze  . aspirin 325 MG tablet Take 325 mg by mouth daily.  [name] atorvastatin (LIPITOR) 40 MG tablet TAKE 1 TABLET BY MOUTH BEDTIME  . Blood Glucose Monitoring Suppl (ONETOUCH VERIO) w/Device KIT USE AS DIRECTED  . clopidogrel (PLAVIX) 75 MG tablet TAKE 1 TABLET BY MOUTH ONCE DAILY  . cyclobenzaprine (FLEXERIL) 5 MG tablet Take 0.5-1 tablets (2.5-5 mg total) by mouth 3 (three) times daily as needed for muscle spasms.  . Fluticasone-Umeclidin-Vilant (TRELEGY ELLIPTA) 100-62.5-25 MCG/INH AEPB INHALE 1 PUFF INTO THE LUNGS ONCE DAILY  . glucose blood (ONETOUCH VERIO) test strip Use as instructed  . Lancet Devices (ONE TOUCH DELICA LANCING DEV) MISC 1 Device by Does not apply route 2 (two) times daily.  [name] Lancets 97L MISC 1 Device by Does not apply route 2 (two) times daily.  . traZODone (DESYREL) 50 MG tablet Take 0.5-1 tablets (25-50 mg total) by mouth at bedtime as needed for sleep.  . [DISCONTINUED] buPROPion (WELLBUTRIN XL) 150 MG 24 hr tablet Take 1 tablet (150 mg total) by mouth daily. (Patient not taking: Reported on 12/21/2020)  . [DISCONTINUED] nicotine polacrilex (COMMIT) 4 MG lozenge Take 4 mg by mouth as needed for smoking cessation. (Patient not taking: No sig reported)  . [DISCONTINUED] ondansetron (ZOFRAN) 4 MG tablet Take 2 tablets (8 mg total) by mouth every 8 (  eight) hours as needed for nausea or vomiting. (Patient not taking: Reported on 12/21/2020)  . [DISCONTINUED] traZODone (DESYREL) 50 MG tablet Take 0.5-1 tablets (25-50 mg total) by mouth at bedtime as needed for sleep.   No facility-administered encounter medications on file as of 12/21/2020.    Patient Active Problem List    Diagnosis Date Noted  . Loss of taste 09/23/2020  . Vitamin B12 deficiency 08/20/2020  . Personal history of tobacco use, presenting hazards to health 08/20/2020  . Normocytic anemia 06/15/2020  . Colon cancer screening 05/25/2020  . Right hand pain 01/29/2020  . Visit for suture removal 01/23/2020  . Encounter for screening colonoscopy 01/23/2020  . Carotid artery stenosis 12/30/2019  . 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 Weakness of left upper extremity 10/16/2019  . Left-sided weakness 10/16/2019  . Hypertension associated with diabetes ([place]) 12/19/2018  . Chronic midline low back pain without sciatica 05/07/2018  . Radiculopathy of leg 04/01/2017  . Calcification of aorta (H[facility]) 09/27/2016  . Early satiety 07/26/2016  . CMC arthritis, thumb, degenerative 07/16/2015  . Weight loss 06/07/2015  . Tenosynovitis of wrist 06/07/2015  . Protein calorie malnutrition ([place]) 06/07/2015  . Diabetes mellitus type 2, controlled, with complications ([place]) 03/13/9484  . Cataract 03/30/2015  . Other and unspecified disc disorder of cervical region 03/30/2015  . Insomnia 03/30/2015  . Centrilobular emphysema ([place]) 03/30/2015  . Clinical depression 03/30/2015  . Hyperlipidemia associated with type 2 diabetes mellitus ([place]) 03/30/2015  . Gastro-esophageal reflux disease without esophagitis 03/30/2015  . Increased thickness of nail 03/30/2015  . History of cerebrovascular accident with residual deficit 03/30/2015  . Osteoarthritis 03/30/2015  . Osteopenia 03/30/2015  . Allergic rhinitis 03/30/2015  . B12 deficiency 03/30/2015  . Tobacco use disorder 03/30/2015  . Vitamin D deficiency 03/30/2015  . Mycotic toenails 08/12/2014  . Diabetic peripheral neuropathy associated with type 2 diabetes mellitus ([place]) 08/12/2014    Care Plan : General Social Work (Adult)  Updates made by Greg Cutter, LCSW since 12/21/2020 12:00 AM    Problem: Coping Skills  (General Plan of Care)     Long-Range Goal: Coping Skills Enhanced   Start Date: 12/14/2020  Recent Progress: On track  Priority: Medium  Note:    Timeframe:  Long-Range Goal Priority:  Medium Start Date:   12/14/20                    Expected End Date: 03/16/21                Follow Up Date -01/26/21  - check out options for in-home help, long-term care or hospice - complete a living will - discuss my treatment options with the doctor or nurse - do one enjoyable thing every day - do something different, like talking to a new person or going to a new place, every day - learn something new by asking, reading and searching the Internet every day - make an audio or video recording for my loved ones - make shared treatment decisions with doctor - meditate daily - name a health care proxy (decision maker) - share memories using a picture album or scrapbook with my loved ones - spend time with a child every day, borrow one if I have to - spend time outdoors at least 3 times a week - strengthen or fix relationships with loved ones    Why is this important?    Having a long-term illness can  be scary.   It can also be stressful for you and your caregiver.   These steps may help.   Current Barriers:  . Financial constraints . Limited social support . ADL IADL limitations . Mental Health Concerns  . Social Isolation . Limited access to caregiver . Inability to perform ADL's independently . Inability to perform IADL's independently . Lacks knowledge of community resource: grief support resources within the area  Clinical Social Work Clinical Goal(s):  [name] Over the next 120 days, patient will work with SW to address concerns related to gaining additional support within the home and resource connection in order to maintain health and independency within the community  . Over the next 120 days, patient will demonstrate improved adherence to self care as evidenced by implementing healthy  self-care into her daily routine such as: attending all medical appointments, taking time for self-reflection, taking medications as prescribed, drinking water and daily exercise to improve mobility.  Interventions: . Patient interviewed and appropriate assessments performed . Patient reports that she successfully attended PCP appointment today on 12/21/20 and she was taken off metformin as she has been experiencing recent weight loss. Patient reports that she has went from 123 lbs to 117 lbs. Patient reports that she had another fall today while getting ready for this appointment. Thankfully, patient's daughter in law was there to assist her with getting back up. Patient continues to experience weakness in the legs. CCM LCSW provided fall prevention and healthy self care education. Patient was receptive this this information.  . Patient reports that she is looking forward to receiving her food stamps as she struggles with affording groceries at times. Patient reports receiving over $100 in EBT per month. . Patient shares that her granddaughter has been providing additional support to her lately in terms of caregiving and running errands for her.  . Patient reports that she successfully received housing resource list that CCM LCSW and C3 Guide provided to her but has not had the chance to review or investigate list yet. CCM LCSW provided additional education on low income housing resources within her area. Patient continues to reside in her apartment with her daughter in law. Patient's son is expected to be released in December of 2022. Patient is looking forward to his return.  . Patient had three falls recently in her apartment. Patient reports that she fell on her face during two of these falls. One which led to an ED visit on 11/18/20 due to her injuries. Patient reports that the ED suggested that she follow up with ENT doctor as she injured part of her nose. Patient was encouraged to discuss this concern  with PCP on 12/21/20. Patient reports that her legs continue to "give out" on her. Patient reports ongoing leg weakness and poor mobility.  . Patient was informed that current CCM LCSW will be leaving position next month and her next CCM Social Work follow up visit will be with another LCSW. Patient was appreciative of support provided and receptive to news . Education provided on how to implement appropriate self-care and coping tools into her daily routine. Patient was receptive to this education. . Patient reports that her feet continue to swell even though she elevates them on a pillow regularly. Patient reports that her right toe is red and irritated. Patient shares that she put some pain relief ointment on it last night which helped. . Provided mental health counseling with regard to current stressors with finding stable housing. LCSW used active and  reflective listening and implemented appropriate interventions to help suppport patient and her emotional needs. Advised patient to implement deep breathing/grounding/meditation/self-care exercises into her daily routine to combat stressors. Education provided.  [name] LCSW was informed that patient's sleep hygiene and routine is the same. She reports ongoing difficulty with staying asleep at night which affects her energy, mobility and motivation. Patient reports interest in gaining medication assistance for this concern. Patient ask for [facility] office number so that she can set up appointment with PCP to discuss. LCSW provided education on healthy sleep hygiene and what that looks like. LCSW encouraged patient to implement a night time routine into her schedule that works best for her and that she is able to maintain. Advised patient to implement deep breathing/grounding/meditation/self-care exercises into her nightly routine to combat racing thoughts at night.   . Provided patient with information about mental health and financial support resources within her area.  Patient denies needing this support at this time but was appreciative of education that was provided.  . Discussed plans with patient for ongoing care management follow up and provided patient with direct contact information for care management team . Patient reports that she has been managing her blood pressure very well and is proud of this accomplishment. LCSW provided education on healthy self-care and provide positive reinforcement for taking action to improve her overall health and symptoms.  . Assisted patient/caregiver with obtaining information about health plan benefits . LCSW used active and reflective listening and implemented appropriate interventions to help suppport patient and her emotional needs. . Reviewed all upcoming medical appointments with patent. Patient has an upcoming PCP appointment on 12/21/20.  [name] Provided patient with information about LTC placement as patient already has Medicaid secondary insurance coverage. LCSW also provided patient with education on available transportation resources through [facility] and [facility]. Patient will need to contact her Medicaid caseworker at [place] to notify them that she wishes to sign up for transportation services. . Evaluated patient's stress level. Emotional Support provided due to ongoing health complications.  [name] 1:1 collaboration with PCP regarding development and update of comprehensive plan of care as evidenced by provider attestation and co-signature  . Discussed plans with patient for ongoing care management follow up and provided patient with direct contact information for care management team . Advised patient to contact CCM providers if she has any concerns in regards to her safety, housing or well-being. Patient reports being currently stable.  . Assisted patient/caregiver with obtaining information about health plan benefits . Provided education and assistance to client regarding Advanced Directives. . Provided education to  patient/caregiver regarding level of care options. . Patient confirms to struggle with ongoing headaches which causes her stress/pain/fatigue. Emotional support provided and patient was receptive to this. .  Positive reinforcement provided for keeping up with all of her medical appointments in the past. However, patient has missed some appointments in the past because she did not call transportation 3 days in advance to set up arrangements.  . Patient reports that her ramp is still effectively working for her. . Patient declines any urgent case management needs at this time. Patient reports being stable and having food at this time.  Patient Self Care Activities:  . Attends all scheduled provider appointments . Calls provider office for new concerns or questions  Please see past updates related to this goal by clicking on the "Past Updates" button in the selected goal       Follow Up Plan: SW will follow up with  patient by phone over the next 45 days      [name], [place], MSW, [email or web address] Phone: [phone]

## 2020-12-21 NOTE — Patient Instructions (Signed)
Licensed Clinical Social Worker Visit Information  Goals we discussed today:  Goals Addressed              This Visit's Progress   .  SW-I probably could use some extra support right now. (pt-stated)         Timeframe:  Long-Range Goal Priority:  Medium Start Date:   12/14/20                    Expected End Date: 03/16/21                Follow Up Date -01/26/21  - check out options for in-home help, long-term care or hospice - complete a living will - discuss my treatment options with the doctor or nurse - do one enjoyable thing every day - do something different, like talking to a new person or going to a new place, every day - learn something new by asking, reading and searching the Internet every day - make an audio or video recording for my loved ones - make shared treatment decisions with doctor - meditate daily - name a health care proxy (decision maker) - share memories using a picture album or scrapbook with my loved ones - spend time with a child every day, borrow one if I have to - spend time outdoors at least 3 times a week - strengthen or fix relationships with loved ones    Why is this important?    Having a long-term illness can be scary.   It can also be stressful for you and your caregiver.   These steps may help.   Current Barriers:  . Financial constraints . Limited social support . ADL IADL limitations . Mental Health Concerns  . Social Isolation . Limited access to caregiver . Inability to perform ADL's independently . Inability to perform IADL's independently . Lacks knowledge of community resource: grief support resources within the area  Clinical Social Work Clinical Goal(s):  [name] Over the next 120 days, patient will work with SW to address concerns related to gaining additional support within the home and resource connection in order to maintain health and independency within the community  . Over the next 120 days, patient will demonstrate  improved adherence to self care as evidenced by implementing healthy self-care into her daily routine such as: attending all medical appointments, taking time for self-reflection, taking medications as prescribed, drinking water and daily exercise to improve mobility.  Interventions: . Patient interviewed and appropriate assessments performed . Patient reports that she successfully attended PCP appointment today on 12/21/20 and she was taken off metformin as she has been experiencing recent weight loss. Patient reports that she has went from 123 lbs to 117 lbs. Patient reports that she had another fall today while getting ready for this appointment. Thankfully, patient's daughter in law was there to assist her with getting back up. Patient continues to experience weakness in the legs. CCM LCSW provided fall prevention and healthy self care education. Patient was receptive this this information.  . Patient reports that she is looking forward to receiving her food stamps as she struggles with affording groceries at times. Patient reports receiving over $100 in EBT per month. . Patient shares that her granddaughter has been providing additional support to her lately in terms of caregiving and running errands for her.  . Patient reports that she successfully received housing resource list that CCM LCSW and C3 Guide provided to her but has not had  the chance to review or investigate list yet. CCM LCSW provided additional education on low income housing resources within her area. Patient continues to reside in her apartment with her daughter in law. Patient's son is expected to be released in December of 2022. Patient is looking forward to his return.  . Patient had three falls recently in her apartment. Patient reports that she fell on her face during two of these falls. One which led to an ED visit on 11/18/20 due to her injuries. Patient reports that the ED suggested that she follow up with ENT doctor as she  injured part of her nose. Patient was encouraged to discuss this concern with PCP on 12/21/20. Patient reports that her legs continue to "give out" on her. Patient reports ongoing leg weakness and poor mobility.  . Patient was informed that current CCM LCSW will be leaving position next month and her next CCM Social Work follow up visit will be with another LCSW. Patient was appreciative of support provided and receptive to news . Education provided on how to implement appropriate self-care and coping tools into her daily routine. Patient was receptive to this education. . Patient reports that her feet continue to swell even though she elevates them on a pillow regularly. Patient reports that her right toe is red and irritated. Patient shares that she put some pain relief ointment on it last night which helped. . Provided mental health counseling with regard to current stressors with finding stable housing. LCSW used active and reflective listening and implemented appropriate interventions to help suppport patient and her emotional needs. Advised patient to implement deep breathing/grounding/meditation/self-care exercises into her daily routine to combat stressors. Education provided.  [name] LCSW was informed that patient's sleep hygiene and routine is the same. She reports ongoing difficulty with staying asleep at night which affects her energy, mobility and motivation. Patient reports interest in gaining medication assistance for this concern. Patient ask for [facility] office number so that she can set up appointment with PCP to discuss. LCSW provided education on healthy sleep hygiene and what that looks like. LCSW encouraged patient to implement a night time routine into her schedule that works best for her and that she is able to maintain. Advised patient to implement deep breathing/grounding/meditation/self-care exercises into her nightly routine to combat racing thoughts at night.   . Provided patient with  information about mental health and financial support resources within her area. Patient denies needing this support at this time but was appreciative of education that was provided.  . Discussed plans with patient for ongoing care management follow up and provided patient with direct contact information for care management team . Patient reports that she has been managing her blood pressure very well and is proud of this accomplishment. LCSW provided education on healthy self-care and provide positive reinforcement for taking action to improve her overall health and symptoms.  . Assisted patient/caregiver with obtaining information about health plan benefits . LCSW used active and reflective listening and implemented appropriate interventions to help suppport patient and her emotional needs. . Reviewed all upcoming medical appointments with patent. Patient has an upcoming PCP appointment on 12/21/20.  [name] Provided patient with information about LTC placement as patient already has Medicaid secondary insurance coverage. LCSW also provided patient with education on available transportation resources through [facility] and [facility]. Patient will need to contact her Medicaid caseworker at [place] to notify them that she wishes to sign up for transportation services. . Evaluated patient's stress level.  Emotional Support provided due to ongoing health complications.  [name] 1:1 collaboration with PCP regarding development and update of comprehensive plan of care as evidenced by provider attestation and co-signature  . Discussed plans with patient for ongoing care management follow up and provided patient with direct contact information for care management team . Advised patient to contact CCM providers if she has any concerns in regards to her safety, housing or well-being. Patient reports being currently stable.  . Assisted patient/caregiver with obtaining information about health plan benefits . Provided education and  assistance to client regarding Advanced Directives. . Provided education to patient/caregiver regarding level of care options. . Patient confirms to struggle with ongoing headaches which causes her stress/pain/fatigue. Emotional support provided and patient was receptive to this. .  Positive reinforcement provided for keeping up with all of her medical appointments in the past. However, patient has missed some appointments in the past because she did not call transportation 3 days in advance to set up arrangements.  . Patient reports that her ramp is still effectively working for her. . Patient declines any urgent case management needs at this time. Patient reports being stable and having food at this time.  Patient Self Care Activities:  . Attends all scheduled provider appointments . Calls provider office for new concerns or questions  Please see past updates related to this goal by clicking on the "Past Updates" button in the selected goal        [name], [place], MSW, [email or web address] Phone: [phone]

## 2020-12-21 NOTE — Assessment & Plan Note (Addendum)
Patient has lost 6 lbs since January 2022. Denies change in appetite. Patient is a current smoker. Patient already has order for low dose CT. Ordered chest x-ray, CBC, CMP, Lipid panel, and TSH today. Will follow-up in 2 weeks.

## 2020-12-22 LAB — COMPREHENSIVE METABOLIC PANEL
AG Ratio: 1.9 (calc) (ref 1.0–2.5)
ALT: 17 U/L (ref 6–29)
AST: 14 U/L (ref 10–35)
Albumin: 4.4 g/dL (ref 3.6–5.1)
Alkaline phosphatase (APISO): 72 U/L (ref 37–153)
BUN: 16 mg/dL (ref 7–25)
CO2: 27 mmol/L (ref 20–32)
Calcium: 10 mg/dL (ref 8.6–10.4)
Chloride: 104 mmol/L (ref 98–110)
Creat: 0.82 mg/dL (ref 0.60–0.93)
Globulin: 2.3 g/dL (calc) (ref 1.9–3.7)
Glucose, Bld: 144 mg/dL — ABNORMAL HIGH (ref 65–99)
Potassium: 4.1 mmol/L (ref 3.5–5.3)
Sodium: 139 mmol/L (ref 135–146)
Total Bilirubin: 0.3 mg/dL (ref 0.2–1.2)
Total Protein: 6.7 g/dL (ref 6.1–8.1)

## 2020-12-22 LAB — CBC WITH DIFFERENTIAL/PLATELET
Absolute Monocytes: 260 cells/uL (ref 200–950)
Basophils Absolute: 20 cells/uL (ref 0–200)
Basophils Relative: 0.4 %
Eosinophils Absolute: 199 cells/uL (ref 15–500)
Eosinophils Relative: 3.9 %
HCT: 34.6 % — ABNORMAL LOW (ref 35.0–45.0)
Hemoglobin: 11.5 g/dL — ABNORMAL LOW (ref 11.7–15.5)
Lymphs Abs: 1652 cells/uL (ref 850–3900)
MCH: 30.6 pg (ref 27.0–33.0)
MCHC: 33.2 g/dL (ref 32.0–36.0)
MCV: 92 fL (ref 80.0–100.0)
MPV: 8.8 fL (ref 7.5–12.5)
Monocytes Relative: 5.1 %
Neutro Abs: 2968 cells/uL (ref 1500–7800)
Neutrophils Relative %: 58.2 %
Platelets: 261 10*3/uL (ref 140–400)
RBC: 3.76 10*6/uL — ABNORMAL LOW (ref 3.80–5.10)
RDW: 12.8 % (ref 11.0–15.0)
Total Lymphocyte: 32.4 %
WBC: 5.1 10*3/uL (ref 3.8–10.8)

## 2020-12-22 LAB — LIPID PANEL
Cholesterol: 92 mg/dL (ref <200)
HDL: 51 mg/dL (ref >=50)
LDL Cholesterol (Calc): 26 mg/dL (calc)
Non-HDL Cholesterol (Calc): 41 mg/dL (calc) (ref <130)
Total CHOL/HDL Ratio: 1.8 (calc) (ref <5.0)
Triglycerides: 70 mg/dL (ref <150)

## 2020-12-22 LAB — TSH: TSH: 0.68 mIU/L (ref 0.40–4.50)

## 2020-12-27 ENCOUNTER — Ambulatory Visit: Payer: Medicare Other | Admitting: Podiatry

## 2020-12-29 ENCOUNTER — Ambulatory Visit: Payer: Medicare Other | Admitting: Licensed Clinical Social Worker

## 2020-12-29 DIAGNOSIS — F324 Major depressive disorder, single episode, in partial remission: Secondary | ICD-10-CM | POA: Diagnosis not present

## 2020-12-29 DIAGNOSIS — E1165 Type 2 diabetes mellitus with hyperglycemia: Secondary | ICD-10-CM | POA: Diagnosis not present

## 2020-12-29 DIAGNOSIS — E118 Type 2 diabetes mellitus with unspecified complications: Secondary | ICD-10-CM

## 2020-12-29 DIAGNOSIS — Z794 Long term (current) use of insulin: Secondary | ICD-10-CM

## 2020-12-29 DIAGNOSIS — IMO0002 Reserved for concepts with insufficient information to code with codable children: Secondary | ICD-10-CM

## 2020-12-29 DIAGNOSIS — I10 Essential (primary) hypertension: Secondary | ICD-10-CM

## 2020-12-29 DIAGNOSIS — G4700 Insomnia, unspecified: Secondary | ICD-10-CM

## 2020-12-29 DIAGNOSIS — R296 Repeated falls: Secondary | ICD-10-CM

## 2020-12-29 NOTE — Chronic Care Management (AMB) (Signed)
Chronic Care Management    Clinical Social Work Note  12/29/2020 Name: Kendra [name] MRN: [id number] DOB: [birth year]  Kendra [name] is a 75 y.o. year old female who is a primary care patient of [name], [name], FNP. The CCM team was consulted to assist the patient with chronic disease management and/or care coordination needs related to: [employer]  and Level of Care Concerns.   Engaged with patient by telephone for follow up visit in response to provider referral for social work chronic care management and care coordination services.   Consent to Services:  The patient was given the following information about Chronic Care Management services today, agreed to services, and gave verbal consent: 1. CCM service includes personalized support from designated clinical staff supervised by the primary care provider, including individualized plan of care and coordination with other care providers 2. 24/7 contact phone numbers for assistance for urgent and routine care needs. 3. Service will only be billed when office clinical staff spend 20 minutes or more in a month to coordinate care. 4. Only one practitioner may furnish and bill the service in a calendar month. 5.The patient may stop CCM services at any time (effective at the end of the month) by phone call to the office staff. 6. The patient will be responsible for cost sharing (co-pay) of up to 20% of the service fee (after annual deductible is met). Patient agreed to services and consent obtained.  Patient agreed to services and consent obtained.   Assessment: Review of patient past medical history, allergies, medications, and health status, including review of relevant consultants reports was performed today as part of a comprehensive evaluation and provision of chronic care management and care coordination services.     SDOH (Social Determinants of Health) assessments and interventions performed:    Advanced Directives Status: See  Care Plan for related entries.  CCM Care Plan  Allergies  Allergen Reactions  . Citalopram Nausea And Vomiting    Outpatient Encounter Medications as of 12/29/2020  Medication Sig  . albuterol (VENTOLIN HFA) 108 (90 Base) MCG/ACT inhaler Inhale 1-2 Puffs every 6 hours as needed Shortness of Breath and wheeze  . aspirin 325 MG tablet Take 325 mg by mouth daily.  [name] atorvastatin (LIPITOR) 40 MG tablet TAKE 1 TABLET BY MOUTH BEDTIME  . Blood Glucose Monitoring Suppl (ONETOUCH VERIO) w/Device KIT USE AS DIRECTED  . clopidogrel (PLAVIX) 75 MG tablet TAKE 1 TABLET BY MOUTH ONCE DAILY  . cyclobenzaprine (FLEXERIL) 5 MG tablet Take 0.5-1 tablets (2.5-5 mg total) by mouth 3 (three) times daily as needed for muscle spasms.  . Fluticasone-Umeclidin-Vilant (TRELEGY ELLIPTA) 100-62.5-25 MCG/INH AEPB INHALE 1 PUFF INTO THE LUNGS ONCE DAILY  . glucose blood (ONETOUCH VERIO) test strip Use as instructed  . Lancet Devices (ONE TOUCH DELICA LANCING DEV) MISC 1 Device by Does not apply route 2 (two) times daily.  [name] Lancets 26O MISC 1 Device by Does not apply route 2 (two) times daily.  . traZODone (DESYREL) 50 MG tablet Take 0.5-1 tablets (25-50 mg total) by mouth at bedtime as needed for sleep.   No facility-administered encounter medications on file as of 12/29/2020.    Patient Active Problem List   Diagnosis Date Noted  . Loss of taste 09/23/2020  . Vitamin B12 deficiency 08/20/2020  . Personal history of tobacco use, presenting hazards to health 08/20/2020  . Normocytic anemia 06/15/2020  . Colon cancer screening 05/25/2020  . Right hand pain 01/29/2020  .  Visit for suture removal 01/23/2020  . Encounter for screening colonoscopy 01/23/2020  . Carotid artery stenosis 12/30/2019  . 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 Weakness of left upper extremity 10/16/2019  . Left-sided weakness 10/16/2019  . Hypertension associated with diabetes  ([place]) 12/19/2018  . Chronic midline low back pain without sciatica 05/07/2018  . Radiculopathy of leg 04/01/2017  . Calcification of aorta (HCC) 09/27/2016  . Early satiety 07/26/2016  . CMC arthritis, thumb, degenerative 07/16/2015  . Weight loss 06/07/2015  . Tenosynovitis of wrist 06/07/2015  . Protein calorie malnutrition ([place]) 06/07/2015  . Diabetes mellitus type 2, controlled, with complications ([place]) 51/76/1607  . Cataract 03/30/2015  . Other and unspecified disc disorder of cervical region 03/30/2015  . Insomnia 03/30/2015  . Centrilobular emphysema ([place]) 03/30/2015  . Clinical depression 03/30/2015  . Hyperlipidemia associated with type 2 diabetes mellitus ([place]) 03/30/2015  . Gastro-esophageal reflux disease without esophagitis 03/30/2015  . Increased thickness of nail 03/30/2015  . History of cerebrovascular accident with residual deficit 03/30/2015  . Osteoarthritis 03/30/2015  . Osteopenia 03/30/2015  . Allergic rhinitis 03/30/2015  . B12 deficiency 03/30/2015  . Tobacco use disorder 03/30/2015  . Vitamin D deficiency 03/30/2015  . Mycotic toenails 08/12/2014  . Diabetic peripheral neuropathy associated with type 2 diabetes mellitus ([place]) 08/12/2014   Care Plan : General Social Work (Adult)  Updates made by Greg Cutter, LCSW since 12/29/2020 12:00 AM    Problem: Coping Skills (General Plan of Care)     Long-Range Goal: Coping Skills Enhanced   Start Date: 12/14/2020  Recent Progress: On track  Priority: Medium  Note:    Timeframe:  Long-Range Goal Priority:  Medium Start Date:   12/14/20                    Expected End Date: 03/16/21                Follow Up Date -01/26/21  - check out options for in-home help, long-term care or hospice - complete a living will - discuss my treatment options with the doctor or nurse - do one enjoyable thing every day - do something different, like talking to a new person or going to a new place, every day - learn  something new by asking, reading and searching the Internet every day - make an audio or video recording for my loved ones - make shared treatment decisions with doctor - meditate daily - name a health care proxy (decision maker) - share memories using a picture album or scrapbook with my loved ones - spend time with a child every day, borrow one if I have to - spend time outdoors at least 3 times a week - strengthen or fix relationships with loved ones    Why is this important?    Having a long-term illness can be scary.   It can also be stressful for you and your caregiver.   These steps may help.   Current Barriers:  . Financial constraints . Limited social support . ADL IADL limitations . Mental Health Concerns  . Social Isolation . Limited access to caregiver . Inability to perform ADL's independently . Inability to perform IADL's independently . Lacks knowledge of community resource: grief support resources within the area  Clinical Social Work Clinical Goal(s):  [name] Over the next 120 days, patient will work with SW to address concerns related to gaining additional support within the home and  resource connection in order to maintain health and independency within the community  . Over the next 120 days, patient will demonstrate improved adherence to self care as evidenced by implementing healthy self-care into her daily routine such as: attending all medical appointments, taking time for self-reflection, taking medications as prescribed, drinking water and daily exercise to improve mobility.  Interventions: . Patient interviewed and appropriate assessments performed . Patient reports that she successfully attended PCP appointment on 12/21/20 and she was taken off metformin as she has been experiencing recent weight loss. Patient reports that she has went from 123 lbs to 117 lbs. Patient reports that she had another fall while getting ready for that appointment. Thankfully,  patient's daughter in law was there to assist her with getting back up. Patient continues to experience weakness in the legs. CCM LCSW provided fall prevention and healthy self care education. Patient was receptive this this information.  [name] CCM LCSW completed care coordination with NP Kathrine Haddock on 12/28/20. She reports that patient is interested in a getting a ramp and lift chair. CCM RNCM is already looking into finding a lift chair for patient. Patient reported on 12/29/20 that she does not want a ramp at her current residence. She reports that she is afraid that it would be damaged and messed with and she prefers to wait until she relocates to gain a ramp. She reports that she has found two possible places for her to relocate to by the end of the end of this month but is unable to state the names of these properties. Patient denied needing a C3 referral for ramp installation at this time. However, she states that she will want this referral completed once she relocates. . Patient reports that she is looking forward to receiving her food stamps as she struggles with affording groceries at times. Patient reports receiving over $100 in EBT per month. . Patient shares that her granddaughter has been providing additional support to her lately in terms of caregiving and running errands for her.  . Patient reports that she successfully received housing resource list that CCM LCSW and C3 Guide provided to her and she started investigating these resources. Patient reports that she found two potential properties for her to relocate to. CCM LCSW provided additional education on low income housing resources within her area. Patient continues to reside in her apartment with her daughter in law. Patient's son is expected to be released in December of 2022. Patient is looking forward to his return and was previously wanting to wait to relocate until his release but now wishes to move sooner  . Patient had three falls  recently in her apartment. Patient reports that she fell on her face during two of these falls. One which led to an ED visit on 11/18/20 due to her injuries. Patient reports that the ED suggested that she follow up with ENT doctor as she injured part of her nose. Patient was encouraged to discuss this concern with PCP on 12/21/20. Patient reports that her legs continue to "give out" on her. Patient reports ongoing leg weakness and poor mobility.  . Patient was informed that current CCM LCSW will be leaving position next month and her next CCM Social Work follow up visit will be with another LCSW. Patient was appreciative of support provided and receptive to news . Education provided on how to implement appropriate self-care and coping tools into her daily routine. Patient was receptive to this education. . Patient reports that her  feet continue to swell even though she elevates them on a pillow regularly. Patient reports that her right toe is red and irritated. Patient shares that she put some pain relief ointment on it last night which helped. . Provided mental health counseling with regard to current stressors with finding stable housing. LCSW used active and reflective listening and implemented appropriate interventions to help suppport patient and her emotional needs. Advised patient to implement deep breathing/grounding/meditation/self-care exercises into her daily routine to combat stressors. Education provided.  [name] LCSW was informed that patient's sleep hygiene and routine is the same. She reports ongoing difficulty with staying asleep at night which affects her energy, mobility and motivation. Patient reports interest in gaining medication assistance for this concern. Patient ask for [facility] office number so that she can set up appointment with PCP to discuss. LCSW provided education on healthy sleep hygiene and what that looks like. LCSW encouraged patient to implement a night time routine into her schedule  that works best for her and that she is able to maintain. Advised patient to implement deep breathing/grounding/meditation/self-care exercises into her nightly routine to combat racing thoughts at night.   . Provided patient with information about mental health and financial support resources within her area. Patient denies needing this support at this time but was appreciative of education that was provided.  . Discussed plans with patient for ongoing care management follow up and provided patient with direct contact information for care management team . Patient reports that she has been managing her blood pressure very well and is proud of this accomplishment. LCSW provided education on healthy self-care and provide positive reinforcement for taking action to improve her overall health and symptoms.  . Assisted patient/caregiver with obtaining information about health plan benefits . LCSW used active and reflective listening and implemented appropriate interventions to help suppport patient and her emotional needs. . Reviewed all upcoming medical appointments with patent. Patient has an upcoming [facility] appointment on 01/05/21.  [name] Provided patient with information about LTC placement as patient already has Medicaid secondary insurance coverage. LCSW also provided patient with education on available transportation resources through [facility] and [facility]. Patient will need to contact her Medicaid caseworker at [facility] to notify them that she wishes to sign up for transportation services. . Evaluated patient's stress level. Emotional Support provided due to ongoing health complications.  [name] 1:1 collaboration with PCP regarding development and update of comprehensive plan of care as evidenced by provider attestation and co-signature  . Discussed plans with patient for ongoing care management follow up and provided patient with direct contact information for care management team . Advised patient to contact CCM  providers if she has any concerns in regards to her safety, housing or well-being. Patient reports being currently stable.  . Assisted patient/caregiver with obtaining information about health plan benefits . Provided education and assistance to client regarding Advanced Directives. . Provided education to patient/caregiver regarding level of care options. . Patient confirms to struggle with ongoing headaches which causes her stress/pain/fatigue. Emotional support provided and patient was receptive to this. .  Positive reinforcement provided for keeping up with all of her medical appointments in the past. However, patient has missed some appointments in the past because she did not call transportation 3 days in advance to set up arrangements.  . Patient declines any urgent case management needs at this time. Patient reports being stable and having food at this time.  Patient Self Care Activities:  . Attends all scheduled  provider appointments . Calls provider office for new concerns or questions  Please see past updates related to this goal by clicking on the "Past Updates" button in the selected goal       Follow Up Plan: SW will follow up with patient by phone over the next quarter      [name], [place], MSW, [email or web address] Phone: [phone]

## 2020-12-29 NOTE — Patient Instructions (Signed)
Licensed Clinical Social Worker Visit Information  Goals we discussed today:  Goals Addressed              This Visit's Progress   .  SW-I probably could use some extra support right now. (pt-stated)         Timeframe:  Long-Range Goal Priority:  Medium Start Date:   12/14/20                    Expected End Date: 03/16/21                Follow Up Date -01/26/21  - check out options for in-home help, long-term care or hospice - complete a living will - discuss my treatment options with the doctor or nurse - do one enjoyable thing every day - do something different, like talking to a new person or going to a new place, every day - learn something new by asking, reading and searching the Internet every day - make an audio or video recording for my loved ones - make shared treatment decisions with doctor - meditate daily - name a health care proxy (decision maker) - share memories using a picture album or scrapbook with my loved ones - spend time with a child every day, borrow one if I have to - spend time outdoors at least 3 times a week - strengthen or fix relationships with loved ones    Why is this important?    Having a long-term illness can be scary.   It can also be stressful for you and your caregiver.   These steps may help.   Current Barriers:  . Financial constraints . Limited social support . ADL IADL limitations . Mental Health Concerns  . Social Isolation . Limited access to caregiver . Inability to perform ADL's independently . Inability to perform IADL's independently . Lacks knowledge of community resource: grief support resources within the area  Clinical Social Work Clinical Goal(s):  [name] Over the next 120 days, patient will work with SW to address concerns related to gaining additional support within the home and resource connection in order to maintain health and independency within the community  . Over the next 120 days, patient will demonstrate  improved adherence to self care as evidenced by implementing healthy self-care into her daily routine such as: attending all medical appointments, taking time for self-reflection, taking medications as prescribed, drinking water and daily exercise to improve mobility.  Interventions: . Patient interviewed and appropriate assessments performed . Patient reports that she successfully attended PCP appointment on 12/21/20 and she was taken off metformin as she has been experiencing recent weight loss. Patient reports that she has went from 123 lbs to 117 lbs. Patient reports that she had another fall while getting ready for that appointment. Thankfully, patient's daughter in law was there to assist her with getting back up. Patient continues to experience weakness in the legs. CCM LCSW provided fall prevention and healthy self care education. Patient was receptive this this information.  [name] CCM LCSW completed care coordination with NP Kathrine Haddock on 12/28/20. She reports that patient is interested in a getting a ramp and lift chair. CCM RNCM is already looking into finding a lift chair for patient. Patient reported on 12/29/20 that she does not want a ramp at her current residence. She reports that she is afraid that it would be damaged and messed with and she prefers to wait until she relocates to gain a ramp.  She reports that she has found two possible places for her to relocate to by the end of the end of this month but is unable to state the names of these properties. Patient denied needing a C3 referral for ramp installation at this time. However, she states that she will want this referral completed once she relocates. . Patient reports that she is looking forward to receiving her food stamps as she struggles with affording groceries at times. Patient reports receiving over $100 in EBT per month. . Patient shares that her granddaughter has been providing additional support to her lately in terms of caregiving  and running errands for her.  . Patient reports that she successfully received housing resource list that CCM LCSW and C3 Guide provided to her and she started investigating these resources. Patient reports that she found two potential properties for her to relocate to. CCM LCSW provided additional education on low income housing resources within her area. Patient continues to reside in her apartment with her daughter in law. Patient's son is expected to be released in December of 2022. Patient is looking forward to his return and was previously wanting to wait to relocate until his release but now wishes to move sooner  . Patient had three falls recently in her apartment. Patient reports that she fell on her face during two of these falls. One which led to an ED visit on 11/18/20 due to her injuries. Patient reports that the ED suggested that she follow up with ENT doctor as she injured part of her nose. Patient was encouraged to discuss this concern with PCP on 12/21/20. Patient reports that her legs continue to "give out" on her. Patient reports ongoing leg weakness and poor mobility.  . Patient was informed that current CCM LCSW will be leaving position next month and her next CCM Social Work follow up visit will be with another LCSW. Patient was appreciative of support provided and receptive to news . Education provided on how to implement appropriate self-care and coping tools into her daily routine. Patient was receptive to this education. . Patient reports that her feet continue to swell even though she elevates them on a pillow regularly. Patient reports that her right toe is red and irritated. Patient shares that she put some pain relief ointment on it last night which helped. . Provided mental health counseling with regard to current stressors with finding stable housing. LCSW used active and reflective listening and implemented appropriate interventions to help suppport patient and her emotional needs.  Advised patient to implement deep breathing/grounding/meditation/self-care exercises into her daily routine to combat stressors. Education provided.  [name] LCSW was informed that patient's sleep hygiene and routine is the same. She reports ongoing difficulty with staying asleep at night which affects her energy, mobility and motivation. Patient reports interest in gaining medication assistance for this concern. Patient ask for [facility] office number so that she can set up appointment with PCP to discuss. LCSW provided education on healthy sleep hygiene and what that looks like. LCSW encouraged patient to implement a night time routine into her schedule that works best for her and that she is able to maintain. Advised patient to implement deep breathing/grounding/meditation/self-care exercises into her nightly routine to combat racing thoughts at night.   . Provided patient with information about mental health and financial support resources within her area. Patient denies needing this support at this time but was appreciative of education that was provided.  . Discussed plans with patient for  ongoing care management follow up and provided patient with direct contact information for care management team . Patient reports that she has been managing her blood pressure very well and is proud of this accomplishment. LCSW provided education on healthy self-care and provide positive reinforcement for taking action to improve her overall health and symptoms.  . Assisted patient/caregiver with obtaining information about health plan benefits . LCSW used active and reflective listening and implemented appropriate interventions to help suppport patient and her emotional needs. . Reviewed all upcoming medical appointments with patent. Patient has an upcoming [place] appointment on 01/05/21. [name] Provided patient with information about LTC placement as patient already has Medicaid secondary insurance coverage. LCSW also provided  patient with education on available transportation resources through [facility] and [name]. Patient will need to contact her Medicaid caseworker at [facility] to notify them that she wishes to sign up for transportation services. . Evaluated patient's stress level. Emotional Support provided due to ongoing health complications.  [name] 1:1 collaboration with PCP regarding development and update of comprehensive plan of care as evidenced by provider attestation and co-signature  . Discussed plans with patient for ongoing care management follow up and provided patient with direct contact information for care management team . Advised patient to contact CCM providers if she has any concerns in regards to her safety, housing or well-being. Patient reports being currently stable.  . Assisted patient/caregiver with obtaining information about health plan benefits . Provided education and assistance to client regarding Advanced Directives. . Provided education to patient/caregiver regarding level of care options. . Patient confirms to struggle with ongoing headaches which causes her stress/pain/fatigue. Emotional support provided and patient was receptive to this. .  Positive reinforcement provided for keeping up with all of her medical appointments in the past. However, patient has missed some appointments in the past because she did not call transportation 3 days in advance to set up arrangements.  . Patient declines any urgent case management needs at this time. Patient reports being stable and having food at this time.  Patient Self Care Activities:  . Attends all scheduled provider appointments . Calls provider office for new concerns or questions  Please see past updates related to this goal by clicking on the "Past Updates" button in the selected goal        [name], [place], MSW, [email or web address] Phone: [phone]

## 2021-01-03 ENCOUNTER — Other Ambulatory Visit: Payer: Self-pay | Admitting: Family Medicine

## 2021-01-03 DIAGNOSIS — J432 Centrilobular emphysema: Secondary | ICD-10-CM

## 2021-01-03 DIAGNOSIS — E785 Hyperlipidemia, unspecified: Secondary | ICD-10-CM

## 2021-01-03 DIAGNOSIS — IMO0002 Reserved for concepts with insufficient information to code with codable children: Secondary | ICD-10-CM

## 2021-01-03 DIAGNOSIS — E1165 Type 2 diabetes mellitus with hyperglycemia: Secondary | ICD-10-CM

## 2021-01-03 NOTE — Telephone Encounter (Signed)
Requested medication (s) are due for refill today: yes  Requested medication (s) are on the active medication list: yes  Last refill:  12/06/2020  Future visit scheduled:no  Notes to clinic:  Medication not assigned to a protocol, review manually   Requested Prescriptions  Pending Prescriptions Disp Refills   Fluticasone-Umeclidin-Vilant (TRELEGY ELLIPTA) 100-62.5-25 MCG/INH AEPB [Pharmacy Med Name: TRELEGY ELLIPTA 100-62.5-25 MCG/INH] 60 each 1    Sig: USE ONE INHALATION INTO THE LUNGS ONCE A DAY RINSE MOUTH AFTER EACH USE      Off-Protocol Failed - 01/03/2021  1:17 PM      Failed - Medication not assigned to a protocol, review manually.      Passed - Valid encounter within last 12 months    Recent Outpatient Visits           1 week ago Uncontrolled type 2 diabetes mellitus with insulin therapy ([place])   [name] Kathrine Haddock, NP   3 months ago Controlled type 2 diabetes mellitus with complication, with long-term current use of insulin ([place])   [facility], [name], FNP   6 months ago Insomnia, unspecified type   [facility], [name], FNP   7 months ago Controlled type 2 diabetes mellitus with complication, with long-term current use of insulin [facility])   [name], [name], FNP   11 months ago Right hand pain   [facility], [name], [place]                 Signed Prescriptions Disp Refills   atorvastatin (LIPITOR) 40 MG tablet 90 tablet 0    Sig: TAKE 1 TABLET BY MOUTH BEDTIME      Cardiovascular:  Antilipid - Statins Passed - 01/03/2021  1:17 PM      Passed - Total Cholesterol in normal range and within 360 days    Cholesterol, Total  Date Value Ref Range Status  06/07/2015 177 100 - 199 mg/dL Final   Cholesterol  Date Value Ref Range Status  12/21/2020 92 <200 mg/dL Final  09/02/2014 141 0 - 200 mg/dL Final          Passed - LDL in normal range and  within 360 days    Ldl Cholesterol, Calc  Date Value Ref Range Status  09/02/2014 73 0 - 100 mg/dL Final   LDL Cholesterol (Calc)  Date Value Ref Range Status  12/21/2020 26 mg/dL (calc) Final    Comment:    Reference range: <100 . Desirable range <100 mg/dL for primary prevention;   <70 mg/dL for patients with CHD or diabetic patients  with > or = 2 CHD risk factors. [name] LDL-C is now calculated using the Martin-Hopkins  calculation, which is a validated novel method providing  better accuracy than the Friedewald equation in the  estimation of LDL-C.  Cresenciano Genre et al. Annamaria Helling. 2951;884(16): 2061-2068  (http://education.[employer].com/faq/FAQ164)           Passed - HDL in normal range and within 360 days    HDL Cholesterol  Date Value Ref Range Status  09/02/2014 37 (L) 40 - 60 mg/dL Final   HDL  Date Value Ref Range Status  12/21/2020 51 > OR = 50 mg/dL Final  06/07/2015 56 >39 mg/dL Final    Comment:    According to ATP-III Guidelines, HDL-C >59 mg/dL is considered a negative risk factor for CHD.  Passed - Triglycerides in normal range and within 360 days    Triglycerides  Date Value Ref Range Status  12/21/2020 70 <150 mg/dL Final  09/02/2014 155 0 - 200 mg/dL Final          Passed - Patient is not pregnant      Passed - Valid encounter within last 12 months    Recent Outpatient Visits           1 week ago Uncontrolled type 2 diabetes mellitus with insulin therapy [facility])   [name] Kathrine Haddock, NP   3 months ago Controlled type 2 diabetes mellitus with complication, with long-term current use of insulin [facility])   [facility], [name], FNP   6 months ago Insomnia, unspecified type   [facility], [name], FNP   7 months ago Controlled type 2 diabetes mellitus with complication, with long-term current use of insulin [facility])   [place], FNP   11  months ago Right hand pain   [facility], [name], [place]

## 2021-01-05 ENCOUNTER — Telehealth: Payer: Self-pay

## 2021-01-05 ENCOUNTER — Telehealth: Payer: Self-pay | Admitting: Pharmacist

## 2021-01-05 NOTE — Telephone Encounter (Signed)
  Chronic Care Management   Outreach Note  01/05/2021 Name: Kendra [name] MRN: [id number] DOB: [birth year]  Referred by: [name], FNP Reason for referral : No chief complaint on file.  Was unable to reach patient via telephone today and unable to leave message as no voicemail picks up. Leave HIPAA compliant message on voicemail of son, [name], requesting call back from patient.   Follow Up Plan: Will collaborate with Care Guide to outreach to schedule follow up with me  [name], PharmD, [place] [facility] [phone]

## 2021-01-06 ENCOUNTER — Other Ambulatory Visit: Payer: Self-pay

## 2021-01-06 DIAGNOSIS — M545 Low back pain, unspecified: Secondary | ICD-10-CM

## 2021-01-06 DIAGNOSIS — G8929 Other chronic pain: Secondary | ICD-10-CM

## 2021-01-06 MED ORDER — CYCLOBENZAPRINE HCL 5 MG PO TABS: 2.5000 mg | ORAL_TABLET | Freq: Three times a day (TID) | ORAL | 1 refills | Status: DC | PRN

## 2021-01-06 NOTE — Telephone Encounter (Signed)
Copied from [place] [id number]. Topic: General - Other >> Jan 06, 2021 10:23 AM Leward Quan A wrote: Reason for CRM: [name] called in asking for an updated medication list to be faxed over today please [place], [place].  Phone:  [phone] Fax:  [phone]

## 2021-01-06 NOTE — Telephone Encounter (Signed)
Kendra [name] with [facility] phone number  [phone] is calling to verify the pt is no longer taking Wellbutrin 150 mg last prescribed by dr Raliegh Ip on 11-05-2020

## 2021-01-10 ENCOUNTER — Ambulatory Visit: Payer: Self-pay | Admitting: Pharmacist

## 2021-01-10 ENCOUNTER — Inpatient Hospital Stay: Payer: Medicare Other | Attending: Oncology

## 2021-01-10 DIAGNOSIS — Z794 Long term (current) use of insulin: Secondary | ICD-10-CM

## 2021-01-10 DIAGNOSIS — E538 Deficiency of other specified B group vitamins: Secondary | ICD-10-CM | POA: Diagnosis not present

## 2021-01-10 DIAGNOSIS — E118 Type 2 diabetes mellitus with unspecified complications: Secondary | ICD-10-CM

## 2021-01-10 MED ORDER — CYANOCOBALAMIN 1000 MCG/ML IJ SOLN
1000.0000 ug | Freq: Once | INTRAMUSCULAR | Status: AC
  Administered 2021-01-10: 1000 ug via INTRAMUSCULAR
  Filled 2021-01-10: qty 1

## 2021-01-10 NOTE — Patient Instructions (Signed)
Visit Information  PATIENT GOALS: Goals Addressed            This Visit's Progress   . Pharmacy - Patient Goals       -Please continue to keep log of home blood sugar results and have for [place] to review during our calls -Please continue to keep log of blood pressure results and have for [place] to review during our calls  Our goal bad cholesterol, or LDL, is less than 70 . This is why it is important to continue taking your atorvastatin.  I look forward to talking to you during our next telephone appointment. Please call if you need something sooner!   [name], PharmD, [place] [phone]        The patient verbalized understanding of instructions, educational materials, and care plan provided today and declined offer to receive copy of patient instructions, educational materials, and care plan.   Telephone follow up appointment with care management team member scheduled for: 5/18 at 12:30 pm  [name], PharmD, [place], [place] [phone]

## 2021-01-10 NOTE — Telephone Encounter (Signed)
Patient has been rescheduled.

## 2021-01-10 NOTE — Chronic Care Management (AMB) (Signed)
Chronic Care Management Pharmacy Note  01/10/2021 Name:  Kendra [name] MRN:  [id number] DOB:  [birth year]  Subjective: Kendra [name] is an 75 y.o. year old female who is a primary patient of [name], [name], FNP.  The CCM team was consulted for assistance with disease management and care coordination needs.    Engaged with patient by telephone for follow up visit in response to provider referral for pharmacy case management and/or care coordination services.   Consent to Services:  The patient was given information about Chronic Care Management services, agreed to services, and gave verbal consent prior to initiation of services.  Please see initial visit note for detailed documentation.   Patient Care Team: Malfi, [name], FNP as PCP - General (Family Medicine) Steele Sizer, MD as Attending Physician (Family Medicine) Christene Lye, MD (General Surgery) [place], [place], [place] as Pharmacist Greg Cutter, LCSW as Social Worker (Licensed Clinical Social Worker) Vanita Ingles, RN as Case Manager ([place]) [place], [name], FNP as Nurse Practitioner (Family Medicine)  Recent office visits: Patient seen for Office Visit with Kathrine Haddock, NP on 4/5 for T2DM, weight loss, recent falls and insomnia  Objective:  Lab Results  Component Value Date   HGBA1C 5.8 (A) 12/21/2020      Component Value Date/Time   CHOL 92 12/21/2020 1134   CHOL 177 06/07/2015 1215   CHOL 141 09/02/2014 0440   TRIG 70 12/21/2020 1134   TRIG 155 09/02/2014 0440   HDL 51 12/21/2020 1134   HDL 56 06/07/2015 1215   HDL 37 (L) 09/02/2014 0440   CHOLHDL 1.8 12/21/2020 1134   VLDL 16 10/17/2019 0606   VLDL 31 09/02/2014 0440   LDLCALC 26 12/21/2020 1134   [place] 73 09/02/2014 0440     Social History   Tobacco Use  Smoking Status Current Some Day Smoker  . Packs/day: 0.10  . Years: 61.00  . Pack years: 6.10  . Types: Cigarettes  Smokeless Tobacco Former Systems developer   . Types: Snuff  Tobacco Comment   .5 of 1 cigarette a day   BP Readings from Last 3 Encounters:  12/21/20 (!) 96/57  11/18/20 131/73  09/30/20 119/83   Pulse Readings from Last 3 Encounters:  12/21/20 84  11/18/20 80  09/30/20 76   Wt Readings from Last 3 Encounters:  12/21/20 117 lb 6.4 oz (53.3 kg)  11/18/20 123 lb (55.8 kg)  09/23/20 123 lb 9.6 oz (56.1 kg)    Assessment: Review of patient past medical history, allergies, medications, health status, including review of consultants reports, laboratory and other test data, was performed as part of comprehensive evaluation and provision of chronic care management services.   SDOH:  (Social Determinants of Health) assessments and interventions performed:    CCM Care Plan  Allergies  Allergen Reactions  . Citalopram Nausea And Vomiting    Medications Reviewed Today    Reviewed by Ortencia Kick on 12/21/20 at [place]  Med List Status: <None>  Medication Order Taking? Sig Documenting Provider Last Dose Status Informant  albuterol (VENTOLIN HFA) 108 (90 Base) MCG/ACT inhaler [id number] Yes Inhale 1-2 Puffs every 6 hours as needed Shortness of Breath and wheeze Olin Hauser, DO Taking Active   aspirin 325 MG tablet [id number] Yes Take 325 mg by mouth daily. [provider] Taking Active   atorvastatin (LIPITOR) 40 MG tablet [id number] Yes TAKE 1 TABLET BY MOUTH BEDTIME Olin Hauser, DO Taking Active  Blood Glucose Monitoring Suppl [facility] VERIO) w/Device Drucie Opitz [id number] Yes USE AS DIRECTED Malfi, [name], FNP Taking Active   buPROPion (WELLBUTRIN XL) 150 MG 24 hr tablet [id number] No Take 1 tablet (150 mg total) by mouth daily.  Patient not taking: Reported on 12/21/2020   Olin Hauser, DO Not Taking Active   clopidogrel (PLAVIX) 75 MG tablet [id number] Yes TAKE 1 TABLET BY MOUTH ONCE DAILY Malfi, [name], FNP Taking Active   cyclobenzaprine (FLEXERIL) 5 MG tablet [id number] Yes Take  0.5-1 tablets (2.5-5 mg total) by mouth 3 (three) times daily as needed for muscle spasms. [name], FNP Taking Active   Fluticasone-Umeclidin-Vilant [facility] ELLIPTA) 100-62.5-25 MCG/INH AEPB [id number] Yes INHALE 1 PUFF INTO THE LUNGS ONCE DAILY Olin Hauser, DO Taking Active   glucose blood (ONETOUCH VERIO) test strip [id number] Yes Use as instructed Olin Hauser, DO Taking Active   Lancet Devices (ONE TOUCH DELICA LANCING DEV) MISC [id number] Yes 1 Device by Does not apply route 2 (two) times daily. Malfi, [name], FNP Taking Active   lisinopril (ZESTRIL) 5 MG tablet [id number] Yes Take 1 tablet (5 mg total) by mouth daily. Malfi, [name], FNP Taking Active   metFORMIN (GLUCOPHAGE-XR) 500 MG 24 hr tablet [id number] Yes Take 1 tablet (500 mg total) by mouth 2 (two) times daily before a meal. [place], Devonne Doughty, DO Taking Active   nicotine polacrilex (COMMIT) 4 MG lozenge [id number] No Take 4 mg by mouth as needed for smoking cessation.  Patient not taking: No sig reported   [provider] Not Taking Active   ondansetron (ZOFRAN) 4 MG tablet [id number] No Take 2 tablets (8 mg total) by mouth every 8 (eight) hours as needed for nausea or vomiting.  Patient not taking: Reported on 12/21/2020   [name], FNP Not Taking Active   OneTouch Delica Lancets 99I MISC [id number] Yes 1 Device by Does not apply route 2 (two) times daily. Olin Hauser, DO Taking Active   traZODone (DESYREL) 50 MG tablet [id number] Yes Take 0.5-1 tablets (25-50 mg total) by mouth at bedtime as needed for sleep. Malfi, [name], FNP Taking Active           Patient Active Problem List   Diagnosis Date Noted  . Loss of taste 09/23/2020  . Vitamin B12 deficiency 08/20/2020  . Personal history of tobacco use, presenting hazards to health 08/20/2020  . Normocytic anemia 06/15/2020  . Colon cancer screening 05/25/2020  . Right hand pain 01/29/2020  . Visit for suture  removal 01/23/2020  . Encounter for screening colonoscopy 01/23/2020  . Carotid artery stenosis 12/30/2019  . 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 Weakness of left upper extremity 10/16/2019  . Left-sided weakness 10/16/2019  . Hypertension associated with diabetes ([place]) 12/19/2018  . Chronic midline low back pain without sciatica 05/07/2018  . Radiculopathy of leg 04/01/2017  . Calcification of aorta (HCC) 09/27/2016  . Early satiety 07/26/2016  . CMC arthritis, thumb, degenerative 07/16/2015  . Weight loss 06/07/2015  . Tenosynovitis of wrist 06/07/2015  . Protein calorie malnutrition ([place]) 06/07/2015  . Diabetes mellitus type 2, controlled, with complications ([place]) 90/24/0973  . Cataract 03/30/2015  . Other and unspecified disc disorder of cervical region 03/30/2015  . Insomnia 03/30/2015  . Centrilobular emphysema ([place]) 03/30/2015  . Clinical depression 03/30/2015  . Hyperlipidemia associated with type 2 diabetes mellitus ([place]) 03/30/2015  . Gastro-esophageal reflux disease without esophagitis 03/30/2015  .  Increased thickness of nail 03/30/2015  . History of cerebrovascular accident with residual deficit 03/30/2015  . Osteoarthritis 03/30/2015  . Osteopenia 03/30/2015  . Allergic rhinitis 03/30/2015  . B12 deficiency 03/30/2015  . Tobacco use disorder 03/30/2015  . Vitamin D deficiency 03/30/2015  . Mycotic toenails 08/12/2014  . Diabetic peripheral neuropathy associated with type 2 diabetes mellitus ([place]) 08/12/2014    Immunization History  Administered Date(s) Administered  . Fluad Quad(high Dose 65+) 05/21/2019  . Influenza Split 06/20/2012  . Influenza, High Dose Seasonal PF 07/26/2016, 06/14/2017, 07/31/2018  . Influenza, Seasonal, Injecte, Preservative Fre 08/04/2010  . Influenza,inj,Quad PF,6+ Mos 06/10/2013, 05/26/2014, 06/07/2015  . Influenza-Unspecified 05/26/2014, 06/18/2018  . PFIZER(Purple Top)SARS-COV-2  Vaccination 03/02/2020, 03/30/2020  . Pneumococcal Conjugate-13 10/31/2007, 10/21/2014  . Pneumococcal Polysaccharide-23 10/31/2007, 06/20/2012, 07/31/2018  . Tdap 08/04/2010  . Zoster 06/10/2013    Conditions to be addressed/monitored: HTN, HLD, COPD and DMII  Care Plan : PharmD - Medication Mgmt  Updates made by Vella Raring, RPH-CPP since 01/10/2021 12:00 AM    Problem: Disease Progression     Long-Range Goal: Disease Progression Prevented or Minimized   Start Date: 11/01/2020  Expected End Date: 01/30/2021  This Visit's Progress: On track  Recent Progress: On track  Priority: High  Note:   Current Barriers:  . Financial Barriers . Limited social support  Pharmacist Clinical Goal(s):  [name] Over the next 90 days, patient will maintain adherence to monitoring guidelines and medication adherence to achieve therapeutic efficacy through collaboration with PharmD and provider.   Interventions: . 1:1 collaboration with Olin Hauser, DO regarding development and update of comprehensive plan of care as evidenced by provider attestation and co-signature . Inter-disciplinary care team collaboration (see longitudinal plan of care) . Perform chart review o Patient seen for Office Visit with Kathrine Haddock, NP on 4/5 for T2DM, weight loss, recent falls and insomnia. Provider advised patient: - Stop lisinopril due to recent falls and low BP in office - Stop metformin - Return for follow up in 2 weeks - Referral placed to [facility] Social Worker for assistance with obtaining new cane and lift chair . Today receive message from Care Guide advising patient's missed appointment with CCM Pharmacist has been rescheduled, but patient requesting a call back regarding monitoring paperwork.  . Reach patient by phone. Ms. Mcphearson reports that she is currently leaving an appointment. Reports that she made medication changes as directed at latest PCP office visit and requests CM Pharmacist mail  logs for her to monitor home blood pressure and blood sugar. Patient denies further medication questions/concerns today. o Will mail patient blood pressure and blood sugar logs . Will follow up with patient as scheduled by care guide.   Patient Goals/Self-Care Activities . Over the next 90 days, patient will:  - take medications as prescribed  Using pill packaging from [facility] - check glucose, document, and provide at future appointments - check blood pressure, document, and provide at future appointments  Follow Up Plan: Telephone follow up appointment with care management team member scheduled for: 5/18 at 12:30 pm      Medication Assistance: None required.  Patient affirms current coverage meets needs.  Patient's preferred pharmacy is:  [facility], [place] [address] Phone: [phone] Fax: [phone]  [id number] - [place], [place] [place] [address] Phone: [phone] Fax: [phone]  [facility] [place], [address] [place] [address] Phone: [phone] Fax: [phone]    Follow Up:  Patient agrees to Care Plan and Follow-up.  [name], PharmD, [place], CPP Clinical Pharmacist [facility] [phone]

## 2021-01-19 ENCOUNTER — Telehealth: Payer: Self-pay

## 2021-01-19 NOTE — Telephone Encounter (Signed)
Copied from [place] [id number]. Topic: General - Inquiry >> Jan 19, 2021  9:11 AM [place] wrote: Reason for CRM:Pt wants a form to record her BP on, and mailed to her as no transportation. Pt states that the one for her BS was mailed but not for the BP. Request for mailing, address confirmed.   Blood pressure record sheet and blood sugar both mailed out to the patient.

## 2021-01-20 ENCOUNTER — Telehealth: Payer: Self-pay | Admitting: General Practice

## 2021-01-20 NOTE — Telephone Encounter (Signed)
  Chronic Care Management   Outreach Note  01/20/2021 Name: Kendra [name] MRN: [id number] DOB: [birth year]  Referred by: [name], FNP Reason for referral : Appointment (RNCM: Follow up for Chronic Disease Management and Care Coordination Needs )   An unsuccessful telephone outreach was attempted today. The patient was referred to the case management team for assistance with care management and care coordination.   Follow Up Plan: Telephone follow up appointment with care management team member scheduled for: 01-24-2021  [name] RN, MSN, [place] Mobile: [phone]

## 2021-01-24 ENCOUNTER — Encounter: Payer: Self-pay | Admitting: Family Medicine

## 2021-01-24 ENCOUNTER — Ambulatory Visit: Payer: Medicare Other | Admitting: Pharmacist

## 2021-01-24 ENCOUNTER — Telehealth: Payer: Medicare Other | Admitting: General Practice

## 2021-01-24 ENCOUNTER — Ambulatory Visit (INDEPENDENT_AMBULATORY_CARE_PROVIDER_SITE_OTHER): Payer: Medicare Other | Admitting: General Practice

## 2021-01-24 DIAGNOSIS — Z794 Long term (current) use of insulin: Secondary | ICD-10-CM

## 2021-01-24 DIAGNOSIS — F324 Major depressive disorder, single episode, in partial remission: Secondary | ICD-10-CM

## 2021-01-24 DIAGNOSIS — E1159 Type 2 diabetes mellitus with other circulatory complications: Secondary | ICD-10-CM | POA: Diagnosis not present

## 2021-01-24 DIAGNOSIS — I10 Essential (primary) hypertension: Secondary | ICD-10-CM

## 2021-01-24 DIAGNOSIS — E1169 Type 2 diabetes mellitus with other specified complication: Secondary | ICD-10-CM | POA: Diagnosis not present

## 2021-01-24 DIAGNOSIS — F3341 Major depressive disorder, recurrent, in partial remission: Secondary | ICD-10-CM | POA: Diagnosis not present

## 2021-01-24 DIAGNOSIS — R296 Repeated falls: Secondary | ICD-10-CM | POA: Insufficient documentation

## 2021-01-24 DIAGNOSIS — E785 Hyperlipidemia, unspecified: Secondary | ICD-10-CM | POA: Diagnosis not present

## 2021-01-24 DIAGNOSIS — I152 Hypertension secondary to endocrine disorders: Secondary | ICD-10-CM

## 2021-01-24 DIAGNOSIS — E1142 Type 2 diabetes mellitus with diabetic polyneuropathy: Secondary | ICD-10-CM

## 2021-01-24 DIAGNOSIS — E118 Type 2 diabetes mellitus with unspecified complications: Secondary | ICD-10-CM

## 2021-01-24 NOTE — Chronic Care Management (AMB) (Signed)
Chronic Care Management Pharmacy Note  01/24/2021 Name:  Kendra [name] MRN:  [id number] DOB:  10-May-1946  Subjective: Kendra [name] is an 75 y.o. year old female who is a primary patient of [name], [name], FNP.  The CCM team was consulted for assistance with disease management and care coordination needs.    Engaged with patient by telephone for follow up visit in response to provider referral for pharmacy case management and/or care coordination services.   Consent to Services:  The patient was given information about Chronic Care Management services, agreed to services, and gave verbal consent prior to initiation of services.  Please see initial visit note for detailed documentation.   Patient Care Team: Malfi, [name], FNP as PCP - General (Family Medicine) Steele Sizer, MD as Attending Physician (Family Medicine) Christene Lye, MD (General Surgery) Onward, Virl Diamond, [place] as Pharmacist Greg Cutter, LCSW as Social Worker (Licensed Clinical Social Worker) Vanita Ingles, RN as Case Manager ([place]) Malfi, [name], FNP as Nurse Practitioner (Family Medicine)  Recent office visits: Office Visit with Kathrine Haddock, NP on [facility] visits: Patient seen in [facility] ED on 3/3 for Scalp hematoma.   Objective:  Lab Results  Component Value Date   CREATININE 0.82 12/21/2020   CREATININE 0.66 07/20/2020   CREATININE 0.83 06/14/2020    Lab Results  Component Value Date   HGBA1C 5.8 (A) 12/21/2020   Last diabetic Eye exam: No results found for: HMDIABEYEEXA  Last diabetic Foot exam: No results found for: HMDIABFOOTEX      Component Value Date/Time   CHOL 92 12/21/2020 1134   CHOL 177 06/07/2015 1215   CHOL 141 09/02/2014 0440   TRIG 70 12/21/2020 1134   TRIG 155 09/02/2014 0440   HDL 51 12/21/2020 1134   HDL 56 06/07/2015 1215   HDL 37 (L) 09/02/2014 0440   CHOLHDL 1.8 12/21/2020 1134   VLDL 16 10/17/2019 0606    VLDL 31 09/02/2014 0440   LDLCALC 26 12/21/2020 1134   LDLCALC 73 09/02/2014 0440    Hepatic Function Latest Ref Rng & Units 12/21/2020 07/20/2020 06/14/2020  Total Protein 6.1 - 8.1 g/dL 6.7 7.5 6.6  Albumin 3.5 - 5.0 g/dL - 4.4 -  AST 10 - 35 U/L $Remo'14 20 17  'fCYPd$ ALT 6 - 29 U/L $Remo'17 13 17  'UCIYg$ Alk Phosphatase 38 - 126 U/L - 67 -  Total Bilirubin 0.2 - 1.2 mg/dL 0.3 0.4 0.5    Clinical ASCVD: Yes  The ASCVD Risk score Mikey Bussing DC Jr., et al., 2013) failed to calculate for the following reasons:   The patient has a prior MI or stroke diagnosis     Social History   Tobacco Use  Smoking Status Current Some Day Smoker  . Packs/day: 0.10  . Years: 61.00  . Pack years: 6.10  . Types: Cigarettes  Smokeless Tobacco Former Systems developer  . Types: Snuff  Tobacco Comment   .5 of 1 cigarette a day   BP Readings from Last 3 Encounters:  01/23/21 118/78  12/21/20 (!) 96/57  11/18/20 131/73   Pulse Readings from Last 3 Encounters:  01/23/21 98  12/21/20 84  11/18/20 80   Wt Readings from Last 3 Encounters:  12/21/20 117 lb 6.4 oz (53.3 kg)  11/18/20 123 lb (55.8 kg)  09/23/20 123 lb 9.6 oz (56.1 kg)    Assessment: Review of patient past medical history, allergies, medications, health status, including review of consultants  reports, laboratory and other test data, was performed as part of comprehensive evaluation and provision of chronic care management services.   SDOH:  (Social Determinants of Health) assessments and interventions performed: none   CCM Care Plan  Allergies  Allergen Reactions  . Citalopram Nausea And Vomiting    Medications Reviewed Today    Reviewed by Vella Raring, RPH-CPP (Pharmacist) on 01/24/21 at 1130  Med List Status: <None>  Medication Order Taking? Sig Documenting Provider Last Dose Status Informant  albuterol (VENTOLIN HFA) 108 (90 Base) MCG/ACT inhaler [id number] Yes Inhale 1-2 Puffs every 6 hours as needed Shortness of Breath and wheeze Olin Hauser, DO Taking Active   aspirin 325 MG tablet [id number] Yes Take 325 mg by mouth daily. [provider] Taking Active   atorvastatin (LIPITOR) 40 MG tablet [id number] Yes TAKE 1 TABLET BY MOUTH BEDTIME Olin Hauser, DO Taking Active   Blood Glucose Monitoring Suppl [facility] VERIO) w/Device KIT [id number]  USE AS DIRECTED [name], FNP  Active   clopidogrel (PLAVIX) 75 MG tablet [id number] Yes TAKE 1 TABLET BY MOUTH ONCE DAILY Malfi, [name], FNP Taking Active   cyclobenzaprine (FLEXERIL) 5 MG tablet [id number] No Take 0.5-1 tablets (2.5-5 mg total) by mouth 3 (three) times daily as needed for muscle spasms.  Patient not taking: Reported on 01/24/2021   Olin Hauser, DO Not Taking Active   Fluticasone-Umeclidin-Vilant [facility] ELLIPTA) 100-62.5-25 MCG/INH AEPB [id number] Yes USE ONE INHALATION INTO THE LUNGS ONCE A DAY RINSE MOUTH AFTER EACH USE Olin Hauser, DO Taking Active   glucose blood (ONETOUCH VERIO) test strip [id number]  Use as instructed Olin Hauser, DO  Active   Lancet Devices (ONE TOUCH DELICA LANCING DEV) MISC [id number]  1 Device by Does not apply route 2 (two) times daily. [name], FNP  Active   OneTouch Delica Lancets 78M MISC [id number]  1 Device by Does not apply route 2 (two) times daily. Olin Hauser, DO  Active   traZODone (DESYREL) 50 MG tablet [id number] Yes Take 0.5-1 tablets (25-50 mg total) by mouth at bedtime as needed for sleep. Kathrine Haddock, NP Taking Active           Patient Active Problem List   Diagnosis Date Noted  . Recurrent falls 01/24/2021  . Loss of taste 09/23/2020  . Vitamin B12 deficiency 08/20/2020  . Personal history of tobacco use, presenting hazards to health 08/20/2020  . Normocytic anemia 06/15/2020  . Colon cancer screening 05/25/2020  . Right hand pain 01/29/2020  . Visit for suture removal 01/23/2020  . Encounter for screening colonoscopy 01/23/2020  . Carotid  artery stenosis 12/30/2019  . 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 Weakness of left upper extremity 10/16/2019  . Left-sided weakness 10/16/2019  . Hypertension associated with diabetes ([place]) 12/19/2018  . Chronic midline low back pain without sciatica 05/07/2018  . Radiculopathy of leg 04/01/2017  . Calcification of aorta (HCC) 09/27/2016  . Early satiety 07/26/2016  . CMC arthritis, thumb, degenerative 07/16/2015  . Weight loss 06/07/2015  . Tenosynovitis of wrist 06/07/2015  . Protein calorie malnutrition ([place]) 06/07/2015  . Diabetes mellitus type 2, controlled, with complications ([place]) 47/65/4650  . Cataract 03/30/2015  . Other and unspecified disc disorder of cervical region 03/30/2015  . Insomnia 03/30/2015  . Centrilobular emphysema ([place]) 03/30/2015  . Clinical depression 03/30/2015  . Hyperlipidemia associated with type 2 diabetes mellitus ([place]) 03/30/2015  . Gastro-esophageal  reflux disease without esophagitis 03/30/2015  . Increased thickness of nail 03/30/2015  . History of cerebrovascular accident with residual deficit 03/30/2015  . Osteoarthritis 03/30/2015  . Osteopenia 03/30/2015  . Allergic rhinitis 03/30/2015  . B12 deficiency 03/30/2015  . Tobacco use disorder 03/30/2015  . Vitamin D deficiency 03/30/2015  . Mycotic toenails 08/12/2014  . Diabetic peripheral neuropathy associated with type 2 diabetes mellitus ([place]) 08/12/2014    Immunization History  Administered Date(s) Administered  . Fluad Quad(high Dose 65+) 05/21/2019  . Influenza Split 06/20/2012  . Influenza, High Dose Seasonal PF 07/26/2016, 06/14/2017, 07/31/2018  . Influenza, Seasonal, Injecte, Preservative Fre 08/04/2010  . Influenza,inj,Quad PF,6+ Mos 06/10/2013, 05/26/2014, 06/07/2015  . Influenza-Unspecified 05/26/2014, 06/18/2018  . PFIZER(Purple Top)SARS-COV-2 Vaccination 03/02/2020, 03/30/2020  . Pneumococcal Conjugate-13 10/31/2007, 10/21/2014  .  Pneumococcal Polysaccharide-23 10/31/2007, 06/20/2012, 07/31/2018  . Tdap 08/04/2010  . Zoster 06/10/2013    Conditions to be addressed/monitored: HTN, HLD, COPD and DMII  Care Plan : PharmD - Medication Mgmt  Updates made by Vella Raring, RPH-CPP since 01/24/2021 12:00 AM    Problem: Disease Progression     Long-Range Goal: Disease Progression Prevented or Minimized   Start Date: 11/01/2020  Expected End Date: 01/30/2021  This Visit's Progress: On track  Recent Progress: On track  Priority: High  Note:   Current Barriers:  . Financial Barriers . Limited social support  Pharmacist Clinical Goal(s):  [name] Over the next 90 days, patient will maintain adherence to monitoring guidelines and medication adherence to achieve therapeutic efficacy through collaboration with PharmD and provider.   Interventions: . 1:1 collaboration with Olin Hauser, DO regarding development and update of comprehensive plan of care as evidenced by provider attestation and co-signature . Inter-disciplinary care team collaboration (see longitudinal plan of care) . Perform chart review o Patient seen for Office Visit with Kathrine Haddock, NP on 4/5 for T2DM, weight loss, recent falls and insomnia. Provider advised patient: - Stop lisinopril due to recent falls and low BP in office - Stop metformin - Return for follow up in 2 weeks - Referral placed to [facility] Social Worker for assistance with obtaining new cane and lift chair . Today receive message from CCM Nurse Case Manager. Reports that she spoke with Ms. Krol today and patient stated lisinopril and metformin still included in her pill packs and that patient has been taking these out. . Comprehensive medication review performed; medication list updated in electronic medical record  Medication Management:  . Today follow up with patient who reviews pill pack and confirms neither lisinopril or metformin are in current pack.  . Also place  coordination of care call to [name] with patient on the line to confirm neither lisinopril or metformin are included in patient's current pill pack. Also confirm patient's as needed medications are dispenses in pill bottle, rather than in pill pack to allow patient to take only as needed . Reports takes cyclobenzaprine and trazodone each only as needed. Reports does not take the cyclobenzaprine if taking trazodone. . Unable to determine what medication patient has been removing from her pill pack (advised CCM Nurse Case Manager that she had been removing what she thought was lisinopril and metformin).  . Counsel patient to take medications as directed from pill pack moving forward  T2DM: . Current treatment: none . Reports her appetite has increased since last seen by NP Kathrine Haddock. Reports eating regular meals throughout the day o Denies weighing herself at home as does not have a  scale . Denies monitoring home blood sugar recently . Encourage patient to keep a log when she monitors and bring record with her to medical appointments . Collaborate with CCM Nurse Case Manager about sending patient a new log  HTN: . Current treatment: none . Denies dizziness since stopped lisinopril. Denies falls since visit with NP Kathrine Haddock . Denies monitoring home blood pressure recently . Encourage patient to keep a log when she monitors and bring record with her to medical appointments . Collaborate with CCM Nurse Case Manager about sending patient a new log  Coordination of Care: . Counsel patient to call PCP office to schedule her follow up visit  . Confirms has transportation scheduled for upcoming appointments with [place]  Patient Goals/Self-Care Activities . Over the next 90 days, patient will:  - take medications as prescribed  Using pill packaging from [facility] - check glucose, document, and provide at future appointments - check blood pressure, document, and provide at  future appointments  Follow Up Plan: Telephone follow up appointment with care management team member scheduled for: 5/18 at 12:30 pm      Patient's preferred pharmacy is:  [facility], [place]. [place] [address] Phone: [phone] Fax: [phone]  [id number] - [place], [address] [place] [address] Phone: [phone] Fax: [phone]  [address] [facility] [place], [place] [address] Phone: [phone] Fax: [phone]  Uses pill box? Uses pill pack  Follow Up:  Patient agrees to Care Plan and Follow-up.  [name], PharmD, [place], CPP Clinical Pharmacist [facility] [phone]

## 2021-01-24 NOTE — Chronic Care Management (AMB) (Signed)
Chronic Care Management   CCM RN Visit Note  01/24/2021 Name: Kendra [name] MRN: [id number] DOB: 1945/11/15  Subjective: Kendra [name] is a 75 y.o. year old female who is a primary care patient of [name], FNP. The care management team was consulted for assistance with disease management and care coordination needs.    Engaged with patient by telephone for follow up visit in response to provider referral for case management and/or care coordination services.   Consent to Services:  The patient was given information about Chronic Care Management services, agreed to services, and gave verbal consent prior to initiation of services.  Please see initial visit note for detailed documentation.   Patient agreed to services and verbal consent obtained.   Assessment: Review of patient past medical history, allergies, medications, health status, including review of consultants reports, laboratory and other test data, was performed as part of comprehensive evaluation and provision of chronic care management services.   SDOH (Social Determinants of Health) assessments and interventions performed:    CCM Care Plan  Allergies  Allergen Reactions  . Citalopram Nausea And Vomiting    Outpatient Encounter Medications as of 01/24/2021  Medication Sig  . albuterol (VENTOLIN HFA) 108 (90 Base) MCG/ACT inhaler Inhale 1-2 Puffs every 6 hours as needed Shortness of Breath and wheeze  . aspirin 325 MG tablet Take 325 mg by mouth daily.  [name] atorvastatin (LIPITOR) 40 MG tablet TAKE 1 TABLET BY MOUTH BEDTIME  . Blood Glucose Monitoring Suppl (ONETOUCH VERIO) w/Device KIT USE AS DIRECTED  . clopidogrel (PLAVIX) 75 MG tablet TAKE 1 TABLET BY MOUTH ONCE DAILY  . cyclobenzaprine (FLEXERIL) 5 MG tablet Take 0.5-1 tablets (2.5-5 mg total) by mouth 3 (three) times daily as needed for muscle spasms.  . Fluticasone-Umeclidin-Vilant (TRELEGY ELLIPTA) 100-62.5-25 MCG/INH AEPB USE ONE INHALATION INTO THE  LUNGS ONCE A DAY RINSE MOUTH AFTER EACH USE  . glucose blood (ONETOUCH VERIO) test strip Use as instructed  . Lancet Devices (ONE TOUCH DELICA LANCING DEV) MISC 1 Device by Does not apply route 2 (two) times daily.  [name] Lancets 20F MISC 1 Device by Does not apply route 2 (two) times daily.  . traZODone (DESYREL) 50 MG tablet Take 0.5-1 tablets (25-50 mg total) by mouth at bedtime as needed for sleep.   No facility-administered encounter medications on file as of 01/24/2021.    Patient Active Problem List   Diagnosis Date Noted  . Loss of taste 09/23/2020  . Vitamin B12 deficiency 08/20/2020  . Personal history of tobacco use, presenting hazards to health 08/20/2020  . Normocytic anemia 06/15/2020  . Colon cancer screening 05/25/2020  . Right hand pain 01/29/2020  . Visit for suture removal 01/23/2020  . Encounter for screening colonoscopy 01/23/2020  . Carotid artery stenosis 12/30/2019  . 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 Weakness of left upper extremity 10/16/2019  . Left-sided weakness 10/16/2019  . Hypertension associated with diabetes ([place]) 12/19/2018  . Chronic midline low back pain without sciatica 05/07/2018  . Radiculopathy of leg 04/01/2017  . Calcification of aorta (HCC) 09/27/2016  . Early satiety 07/26/2016  . CMC arthritis, thumb, degenerative 07/16/2015  . Weight loss 06/07/2015  . Tenosynovitis of wrist 06/07/2015  . Protein calorie malnutrition ([place]) 06/07/2015  . Diabetes mellitus type 2, controlled, with complications ([place]) 00/71/2197  . Cataract 03/30/2015  . Other and unspecified disc disorder of cervical region 03/30/2015  . Insomnia 03/30/2015  .  Centrilobular emphysema ([place]) 03/30/2015  . Clinical depression 03/30/2015  . Hyperlipidemia associated with type 2 diabetes mellitus ([place]) 03/30/2015  . Gastro-esophageal reflux disease without esophagitis 03/30/2015  . Increased thickness of nail  03/30/2015  . History of cerebrovascular accident with residual deficit 03/30/2015  . Osteoarthritis 03/30/2015  . Osteopenia 03/30/2015  . Allergic rhinitis 03/30/2015  . B12 deficiency 03/30/2015  . Tobacco use disorder 03/30/2015  . Vitamin D deficiency 03/30/2015  . Mycotic toenails 08/12/2014  . Diabetic peripheral neuropathy associated with type 2 diabetes mellitus ([place]) 08/12/2014    Conditions to be addressed/monitored:HTN, HLD and the need for DME  Care Plan : RNCM: Hypertension (Adult)  Updates made by Vanita Ingles since 01/24/2021 12:00 AM    Problem: RNCM: Hypertension (Hypertension)   Priority: Medium    Goal: RNCM: Hypertension Monitored   Priority: Medium  Note:   Objective:  . Last practice recorded BP readings:  . BP Readings from Last 3 Encounters: .  01/23/21 . 118/78 .  12/21/20 . (!) 70/17 .  11/18/20 . 131/73 .    [name] Most recent eGFR/CrCl: No results found for: EGFR  No components found for: CRCL Current Barriers:  [name] Knowledge Deficits related to basic understanding of hypertension pathophysiology and self care management . Knowledge Deficits related to understanding of medications prescribed for management of hypertension . Limited Social Support . Unable to independently manage HTN . Does not contact provider office for questions/concerns Case Manager Clinical Goal(s):  [name] Over the next 120 days, patient will verbalize understanding of plan for hypertension management . Over the next 120 days, patient will demonstrate improved adherence to prescribed treatment plan for hypertension as evidenced by taking all medications as prescribed, monitoring and recording blood pressure as directed, adhering to low sodium/DASH diet . Over the next 120 days, patient will demonstrate improved health management independence as evidenced by checking blood pressure as directed and notifying PCP if SBP>160 or DBP > 90, taking all medications as prescribe, and adhering to a  low sodium diet as discussed. . Over the next 120 days, patient will verbalize basic understanding of hypertension disease process and self health management plan as evidenced by adherence to heart healthy diet, medication compliance, working with CCM team to meet health and wellness needs  Interventions:  . Collaboration with Malfi, [name], FNP regarding development and update of comprehensive plan of care as evidenced by provider attestation and co-signature . Inter-disciplinary care team collaboration (see longitudinal plan of care) . Evaluation of current treatment plan related to hypertension self management and patient's adherence to plan as established by provider. 01-24-2021: The patient states that her blood pressure is dong well since being taken off of Lisinopril. The patient could not find her log sheet for April but gave one reading from May and it was recorded. She ask for more log sheets. Will send log sheets to the patient. The patient denies any sx and sx of dizziness or light headedness since medication changes.  . Provided education to patient re: stroke prevention, s/s of heart attack and stroke, DASH diet, complications of uncontrolled blood pressure . Reviewed medications with patient and discussed importance of compliance. 01-24-2021: The patient states that she is doing well since the medications change, but states the pharmacy still put the Metformin and Lisinopril in the pill packs. Education and support given. The patient states she has taken the medications out and is not taking those 2 medications. Will discuss with the pharmacist for recommendations.  [name]  Reviewed request for a lift chair. The patient states that she is having a hard time getting out of her current chair. [employer] supply at [phone] and they do the lift part of the chair and the patient wold be responsible for the rest.  The order can be faxed to [phone].  Will discuss with the pcp and have  them fax over the order to the company.  . Discussed plans with patient for ongoing care management follow up and provided patient with direct contact information for care management team . Advised patient, providing education and rationale, to monitor blood pressure daily and record, calling PCP for findings outside established parameters. 01-24-2021: The patient states that she is doing her readings but could not find her papers. States her blood pressure has been good. Education on alerting the staff for changes in her conditions or fluctuations of her blood pressure.  . Advised the patient to come by the office to pick up her Glucerna, the patient verbalized understanding of picking the Glucerna up at the office.  Patient Goals/Self-Care Activities . Over the next 120 days, patient will:  - Self administers medications as prescribed Attends all scheduled provider appointments Calls provider office for new concerns, questions, or BP outside discussed parameters Checks BP and records as discussed Follows a low sodium diet/DASH diet - blood pressure equipment and technique reviewed - blood pressure trends reviewed - depression screen reviewed - home or ambulatory blood pressure monitoring encouraged  Follow Up Plan: Telephone follow up appointment with care management team member scheduled for: 03-31-2021 at 0900 am\  Timeframe:  Short-Term Goal Priority:  Medium Start Date:                             Expected End Date:                       Follow Up Date 03-31-2021   - check blood pressure 3 times per week - check blood pressure daily - check blood pressure weekly - choose a place to take my blood pressure (home, clinic or office, retail store) - write blood pressure results in a log or diary    Why is this important?    You won't feel high blood pressure, but it can still hurt your blood vessels.   High blood pressure can cause heart or kidney problems. It can also cause a stroke.    Making lifestyle changes like losing a little weight or eating less salt will help.   Checking your blood pressure at home and at different times of the day can help to control blood pressure.   If the doctor prescribes medicine remember to take it the way the doctor ordered.   Call the office if you cannot afford the medicine or if there are questions about it.     Notes: The patient gave the [facility] several readings: 177-116 systolic to 57-90 diastolic. 11-23-3336: The patient has lost her sheet for April. Gave a reading of 118/78 98 for May, had not taken this am at the time of the call. Has requested new log sheets. Will send via mail to the patient.    Care Plan : RNCM: HLD Management  Updates made by Vanita Ingles since 01/24/2021 12:00 AM    Problem: RNCM: Health Promotion or Disease Self-Management (General Plan of Care)   Priority: Medium    Long-Range Goal: RNCM: HLD Self-Management  Plan Developed   Priority: Medium  Note:   Current Barriers:  . Poorly controlled hyperlipidemia, complicated by HTN, smoker, COPd . Current antihyperlipidemic regimen: Lipitor $RemoveBeforeDE'40mg'[id number]$  daily . Most recent lipid panel:     Component Value Date/Time   CHOL 114 06/14/2020 0929   CHOL 177 06/07/2015 1215   CHOL 141 09/02/2014 0440   TRIG 82 06/14/2020 0929   TRIG 155 09/02/2014 0440   HDL 60 06/14/2020 0929   HDL 56 06/07/2015 1215   HDL 37 (L) 09/02/2014 0440   CHOLHDL 1.9 06/14/2020 0929   VLDL 16 10/17/2019 0606   VLDL 31 09/02/2014 0440   LDLCALC 38 06/14/2020 0929   LDLCALC 73 09/02/2014 0440 .   [name] ASCVD risk enhancing conditions: age >35, pre- DM, HTN,  current smoker . Unable to independently manage HLD  . Lacks social connections . Does not contact provider office for questions/concerns  RN Care Manager Clinical Goal(s):  [name] Over the next 120 days, patient will work with Consulting civil engineer, providers, and care team towards execution of optimized self-health management plan . Over the  next 120 days, patient will verbalize understanding of plan for effective management of HLD . Over the next 120 days, patient will work with [facility] and pcp to address needs related to changes in condition related to HLD . Over the next 120 days, patient will demonstrate improved adherence to prescribed treatment plan for HLD as evidenced byadherence to heart healthy diet, medication compliance and working with CCM team to manage HLD and other chronic conditions.   Interventions: . Collaboration with Malfi, [name], FNP regarding development and update of comprehensive plan of care as evidenced by provider attestation and co-signature . Inter-disciplinary care team collaboration (see longitudinal plan of care) . Medication review performed; medication list updated in electronic medical record.  Bertram Savin care team collaboration (see longitudinal plan of care) . Referred to pharmacy team for assistance with HLD medication management . Evaluation of current treatment plan related to HLD and patient's adherence to plan as established by provider. 01-24-2021: The patient verbalized understanding of HLD plan of care. Confirms compliance with medications, and dietary restrictions.  . Advised patient to call the office for changes in condition or questions concerning chronic conditions  . Provided education to patient re: adherence to heart healthy/ADA diet, praised for monitoring blood pressures and supplying readings during outreaches  . Discussed plans with patient for ongoing care management follow up and provided patient with direct contact information for care management team   Patient Goals/Self-Care Activities: . Over the next 120 days, patient will:   - call for medicine refill 2 or 3 days before it runs out - call if I am sick and can't take my medicine - keep a list of all the medicines I take; vitamins and herbals too - learn to read medicine labels - use a pillbox to sort  medicine - use an alarm clock or phone to remind me to take my medicine - drink 6 to 8 glasses of water each day - eat 3 to 5 servings of fruits and vegetables each day - eat 5 or 6 small meals each day - fill half the plate with nonstarchy vegetables - limit fast food meals to no more than 1 per week - manage portion size - prepare main meal at home 3 to 5 days each week - read food labels for fat, fiber, carbohydrates and portion size - be open to making changes - I can  manage, know and watch for signs of a heart attack - if I have chest pain, call for help - learn about small changes that will make a big difference - learn my personal risk factors - barriers to meeting goals identified - change-talk evoked - choices provided - collaboration with team encouraged - decision-making supported - health risks reviewed - problem-solving facilitated - questions answered - readiness for change evaluated - reassurance provided - resources needed to meet goals identified - self-reflection promoted - self-reliance encouraged  Follow Up Plan: Telephone follow up appointment with care management team member scheduled for: 03-31-2021 at 0900 am       Plan:Telephone follow up appointment with care management team member scheduled for:  03-31-2021 at 0900 am  [name] RN, MSN, [place] Mobile: [phone]

## 2021-01-24 NOTE — Patient Instructions (Signed)
Visit Information  PATIENT GOALS: Goals Addressed            This Visit's Progress   . Pharmacy - Patient Goals       -Please continue to keep log of home blood sugar results and have for [place] to review during our calls -Please continue to keep log of blood pressure results and have for [place] to review during our calls  Our goal bad cholesterol, or LDL, is less than 70 . This is why it is important to continue taking your atorvastatin.  I look forward to talking to you during our next telephone appointment. Please call if you need something sooner!  [name], PharmD, [place] [phone]        The patient verbalized understanding of instructions, educational materials, and care plan provided today and declined offer to receive copy of patient instructions, educational materials, and care plan.   Telephone follow up appointment with care management team member scheduled for: 5/18

## 2021-01-24 NOTE — Patient Instructions (Signed)
Visit Information  PATIENT GOALS: Goals Addressed            This Visit's Progress   . RNCM: Track and Manage My Blood Pressure-Hypertension       Timeframe:  Short-Term Goal Priority:  Medium Start Date:                             Expected End Date:                       Follow Up Date 03-31-2021   - check blood pressure 3 times per week - check blood pressure daily - check blood pressure weekly - choose a place to take my blood pressure (home, clinic or office, retail store) - write blood pressure results in a log or diary    Why is this important?    You won't feel high blood pressure, but it can still hurt your blood vessels.   High blood pressure can cause heart or kidney problems. It can also cause a stroke.   Making lifestyle changes like losing a little weight or eating less salt will help.   Checking your blood pressure at home and at different times of the day can help to control blood pressure.   If the doctor prescribes medicine remember to take it the way the doctor ordered.   Call the office if you cannot afford the medicine or if there are questions about it.     Notes: The patient gave the [facility] several readings: 294-765 systolic to 46-50 diastolic. 11-21-4654: The patient has lost her sheet for April. Gave a reading of 118/78 98 for May, had not taken this am at the time of the call. Has requested new log sheets. Will send via mail to the patient.      Patient Care Plan: General Social Work (Adult)    Problem Identified: Coping Skills (General Plan of Care)     Long-Range Goal: Coping Skills Enhanced   Start Date: 12/14/2020  Recent Progress: On track  Priority: Medium  Note:    Timeframe:  Long-Range Goal Priority:  Medium Start Date:   12/14/20                    Expected End Date: 03/16/21                Follow Up Date -01/26/21  - check out options for in-home help, long-term care or hospice - complete a living will - discuss my treatment  options with the doctor or nurse - do one enjoyable thing every day - do something different, like talking to a new person or going to a new place, every day - learn something new by asking, reading and searching the Internet every day - make an audio or video recording for my loved ones - make shared treatment decisions with doctor - meditate daily - name a health care proxy (decision maker) - share memories using a picture album or scrapbook with my loved ones - spend time with a child every day, borrow one if I have to - spend time outdoors at least 3 times a week - strengthen or fix relationships with loved ones    Why is this important?    Having a long-term illness can be scary.   It can also be stressful for you and your caregiver.   These steps may help.   Current Barriers:  .  Financial constraints . Limited social support . ADL IADL limitations . Mental Health Concerns  . Social Isolation . Limited access to caregiver . Inability to perform ADL's independently . Inability to perform IADL's independently . Lacks knowledge of community resource: grief support resources within the area  Clinical Social Work Clinical Goal(s):  [name] Over the next 120 days, patient will work with SW to address concerns related to gaining additional support within the home and resource connection in order to maintain health and independency within the community  . Over the next 120 days, patient will demonstrate improved adherence to self care as evidenced by implementing healthy self-care into her daily routine such as: attending all medical appointments, taking time for self-reflection, taking medications as prescribed, drinking water and daily exercise to improve mobility.  Interventions: . Patient interviewed and appropriate assessments performed . Patient reports that she successfully attended PCP appointment on 12/21/20 and she was taken off metformin as she has been experiencing recent  weight loss. Patient reports that she has went from 123 lbs to 117 lbs. Patient reports that she had another fall while getting ready for that appointment. Thankfully, patient's daughter in law was there to assist her with getting back up. Patient continues to experience weakness in the legs. CCM LCSW provided fall prevention and healthy self care education. Patient was receptive this this information.  [name] CCM LCSW completed care coordination with NP Kathrine Haddock on 12/28/20. She reports that patient is interested in a getting a ramp and lift chair. CCM RNCM is already looking into finding a lift chair for patient. Patient reported on 12/29/20 that she does not want a ramp at her current residence. She reports that she is afraid that it would be damaged and messed with and she prefers to wait until she relocates to gain a ramp. She reports that she has found two possible places for her to relocate to by the end of the end of this month but is unable to state the names of these properties. Patient denied needing a C3 referral for ramp installation at this time. However, she states that she will want this referral completed once she relocates. . Patient reports that she is looking forward to receiving her food stamps as she struggles with affording groceries at times. Patient reports receiving over $100 in EBT per month. . Patient shares that her granddaughter has been providing additional support to her lately in terms of caregiving and running errands for her.  . Patient reports that she successfully received housing resource list that CCM LCSW and C3 Guide provided to her and she started investigating these resources. Patient reports that she found two potential properties for her to relocate to. CCM LCSW provided additional education on low income housing resources within her area. Patient continues to reside in her apartment with her daughter in law. Patient's son is expected to be released in December of 2022.  Patient is looking forward to his return and was previously wanting to wait to relocate until his release but now wishes to move sooner  . Patient had three falls recently in her apartment. Patient reports that she fell on her face during two of these falls. One which led to an ED visit on 11/18/20 due to her injuries. Patient reports that the ED suggested that she follow up with ENT doctor as she injured part of her nose. Patient was encouraged to discuss this concern with PCP on 12/21/20. Patient reports that her legs continue to "give  out" on her. Patient reports ongoing leg weakness and poor mobility.  . Patient was informed that current CCM LCSW will be leaving position next month and her next CCM Social Work follow up visit will be with another LCSW. Patient was appreciative of support provided and receptive to news . Education provided on how to implement appropriate self-care and coping tools into her daily routine. Patient was receptive to this education. . Patient reports that her feet continue to swell even though she elevates them on a pillow regularly. Patient reports that her right toe is red and irritated. Patient shares that she put some pain relief ointment on it last night which helped. . Provided mental health counseling with regard to current stressors with finding stable housing. LCSW used active and reflective listening and implemented appropriate interventions to help suppport patient and her emotional needs. Advised patient to implement deep breathing/grounding/meditation/self-care exercises into her daily routine to combat stressors. Education provided.  [name] LCSW was informed that patient's sleep hygiene and routine is the same. She reports ongoing difficulty with staying asleep at night which affects her energy, mobility and motivation. Patient reports interest in gaining medication assistance for this concern. Patient ask for [name] office number so that she can set up appointment with PCP  to discuss. LCSW provided education on healthy sleep hygiene and what that looks like. LCSW encouraged patient to implement a night time routine into her schedule that works best for her and that she is able to maintain. Advised patient to implement deep breathing/grounding/meditation/self-care exercises into her nightly routine to combat racing thoughts at night.   . Provided patient with information about mental health and financial support resources within her area. Patient denies needing this support at this time but was appreciative of education that was provided.  . Discussed plans with patient for ongoing care management follow up and provided patient with direct contact information for care management team . Patient reports that she has been managing her blood pressure very well and is proud of this accomplishment. LCSW provided education on healthy self-care and provide positive reinforcement for taking action to improve her overall health and symptoms.  . Assisted patient/caregiver with obtaining information about health plan benefits . LCSW used active and reflective listening and implemented appropriate interventions to help suppport patient and her emotional needs. . Reviewed all upcoming medical appointments with patent. Patient has an upcoming [facility] appointment on 01/05/21.  [name] Provided patient with information about LTC placement as patient already has Medicaid secondary insurance coverage. LCSW also provided patient with education on available transportation resources through [facility] and [facility]. Patient will need to contact her Medicaid caseworker at [place] to notify them that she wishes to sign up for transportation services. . Evaluated patient's stress level. Emotional Support provided due to ongoing health complications.  [name] 1:1 collaboration with PCP regarding development and update of comprehensive plan of care as evidenced by provider attestation and co-signature  . Discussed plans with  patient for ongoing care management follow up and provided patient with direct contact information for care management team . Advised patient to contact CCM providers if she has any concerns in regards to her safety, housing or well-being. Patient reports being currently stable.  . Assisted patient/caregiver with obtaining information about health plan benefits . Provided education and assistance to client regarding Advanced Directives. . Provided education to patient/caregiver regarding level of care options. . Patient confirms to struggle with ongoing headaches which causes her stress/pain/fatigue. Emotional support provided and  patient was receptive to this. .  Positive reinforcement provided for keeping up with all of her medical appointments in the past. However, patient has missed some appointments in the past because she did not call transportation 3 days in advance to set up arrangements.  . Patient declines any urgent case management needs at this time. Patient reports being stable and having food at this time.  Patient Self Care Activities:  . Attends all scheduled provider appointments . Calls provider office for new concerns or questions  Please see past updates related to this goal by clicking on the "Past Updates" button in the selected goal    Task: Support Psychosocial Response to Risk or Actual Health Condition   Note:   Care Management Activities:    - active listening utilized - caregiver stress acknowledged - counseling provided - current coping strategies identified - decision-making supported - healthy lifestyle promoted - journaling promoted - meditative movement therapy encouraged - mindfulness encouraged - participation in counseling encouraged - problem-solving facilitated - relaxation techniques promoted - self-reflection promoted - spiritual activities promoted - verbalization of feelings encouraged    Notes:    Patient Care Plan: RNCM: Hypertension  (Adult)    Problem Identified: RNCM: Hypertension (Hypertension)   Priority: Medium    Goal: RNCM: Hypertension Monitored   Priority: Medium  Note:   Objective:  . Last practice recorded BP readings:  . BP Readings from Last 3 Encounters: .  01/23/21 . 118/78 .  12/21/20 . (!) 96/22 .  11/18/20 . 131/73 .    [name] Most recent eGFR/CrCl: No results found for: EGFR  No components found for: CRCL Current Barriers:  [name] Knowledge Deficits related to basic understanding of hypertension pathophysiology and self care management . Knowledge Deficits related to understanding of medications prescribed for management of hypertension . Limited Social Support . Unable to independently manage HTN . Does not contact provider office for questions/concerns Case Manager Clinical Goal(s):  [name] Over the next 120 days, patient will verbalize understanding of plan for hypertension management . Over the next 120 days, patient will demonstrate improved adherence to prescribed treatment plan for hypertension as evidenced by taking all medications as prescribed, monitoring and recording blood pressure as directed, adhering to low sodium/DASH diet . Over the next 120 days, patient will demonstrate improved health management independence as evidenced by checking blood pressure as directed and notifying PCP if SBP>160 or DBP > 90, taking all medications as prescribe, and adhering to a low sodium diet as discussed. . Over the next 120 days, patient will verbalize basic understanding of hypertension disease process and self health management plan as evidenced by adherence to heart healthy diet, medication compliance, working with CCM team to meet health and wellness needs  Interventions:  . Collaboration with Malfi, [name], FNP regarding development and update of comprehensive plan of care as evidenced by provider attestation and co-signature . Inter-disciplinary care team collaboration (see longitudinal plan of  care) . Evaluation of current treatment plan related to hypertension self management and patient's adherence to plan as established by provider. 01-24-2021: The patient states that her blood pressure is dong well since being taken off of Lisinopril. The patient could not find her log sheet for April but gave one reading from May and it was recorded. She ask for more log sheets. Will send log sheets to the patient. The patient denies any sx and sx of dizziness or light headedness since medication changes.  . Provided education to patient re:  stroke prevention, s/s of heart attack and stroke, DASH diet, complications of uncontrolled blood pressure . Reviewed medications with patient and discussed importance of compliance. 01-24-2021: The patient states that she is doing well since the medications change, but states the pharmacy still put the Metformin and Lisinopril in the pill packs. Education and support given. The patient states she has taken the medications out and is not taking those 2 medications. Will discuss with the pharmacist for recommendations.  . Reviewed request for a lift chair. The patient states that she is having a hard time getting out of her current chair. [employer] supply at [phone] and they do the lift part of the chair and the patient wold be responsible for the rest.  The order can be faxed to [phone].  Will discuss with the pcp and have them fax over the order to the company.  . Discussed plans with patient for ongoing care management follow up and provided patient with direct contact information for care management team . Advised patient, providing education and rationale, to monitor blood pressure daily and record, calling PCP for findings outside established parameters. 01-24-2021: The patient states that she is doing her readings but could not find her papers. States her blood pressure has been good. Education on alerting the staff for changes in her conditions or  fluctuations of her blood pressure.  . Advised the patient to come by the office to pick up her Glucerna, the patient verbalized understanding of picking the Glucerna up at the office.  Patient Goals/Self-Care Activities . Over the next 120 days, patient will:  - Self administers medications as prescribed Attends all scheduled provider appointments Calls provider office for new concerns, questions, or BP outside discussed parameters Checks BP and records as discussed Follows a low sodium diet/DASH diet - blood pressure equipment and technique reviewed - blood pressure trends reviewed - depression screen reviewed - home or ambulatory blood pressure monitoring encouraged  Follow Up Plan: Telephone follow up appointment with care management team member scheduled for: 03-31-2021 at 0900 am\  Timeframe:  Short-Term Goal Priority:  Medium Start Date:                             Expected End Date:                       Follow Up Date 03-31-2021   - check blood pressure 3 times per week - check blood pressure daily - check blood pressure weekly - choose a place to take my blood pressure (home, clinic or office, retail store) - write blood pressure results in a log or diary    Why is this important?    You won't feel high blood pressure, but it can still hurt your blood vessels.   High blood pressure can cause heart or kidney problems. It can also cause a stroke.   Making lifestyle changes like losing a little weight or eating less salt will help.   Checking your blood pressure at home and at different times of the day can help to control blood pressure.   If the doctor prescribes medicine remember to take it the way the doctor ordered.   Call the office if you cannot afford the medicine or if there are questions about it.     Notes: The patient gave the [facility] several readings: 607-371 systolic to 06-26 diastolic. 05-22-8545: The patient has lost her  sheet for April. Gave a reading of  118/78 98 for May, had not taken this am at the time of the call. Has requested new log sheets. Will send via mail to the patient.    Task: RNCM: Identify and Monitor Blood Pressure Elevation   Note:   Care Management Activities:    - blood pressure equipment and technique reviewed - blood pressure trends reviewed - depression screen reviewed - home or ambulatory blood pressure monitoring encouraged       Patient Care Plan: RNCM: HLD Management    Problem Identified: RNCM: Health Promotion or Disease Self-Management (General Plan of Care)   Priority: Medium    Long-Range Goal: RNCM: HLD Self-Management Plan Developed   Priority: Medium  Note:   Current Barriers:  . Poorly controlled hyperlipidemia, complicated by HTN, smoker, COPd . Current antihyperlipidemic regimen: Lipitor 50m daily . Most recent lipid panel:     Component Value Date/Time   CHOL 114 06/14/2020 0929   CHOL 177 06/07/2015 1215   CHOL 141 09/02/2014 0440   TRIG 82 06/14/2020 0929   TRIG 155 09/02/2014 0440   HDL 60 06/14/2020 0929   HDL 56 06/07/2015 1215   HDL 37 (L) 09/02/2014 0440   CHOLHDL 1.9 06/14/2020 0929   VLDL 16 10/17/2019 0606   VLDL 31 09/02/2014 0440   LDLCALC 38 06/14/2020 0929   LDLCALC 73 09/02/2014 0440 .   .[name] KitchenASCVD risk enhancing conditions: age >>39 pre- DM, HTN,  current smoker . Unable to independently manage HLD  . Lacks social connections . Does not contact provider office for questions/concerns  RN Care Manager Clinical Goal(s):  .[name] KitchenOver the next 120 days, patient will work with RConsulting civil engineer providers, and care team towards execution of optimized self-health management plan . Over the next 120 days, patient will verbalize understanding of plan for effective management of HLD . Over the next 120 days, [facility] to address needs related to changes in condition related to HLD . Over the next 120 days, patient will demonstrate improved adherence to  prescribed treatment plan for HLD as evidenced byadherence to heart healthy diet, medication compliance and working with CCM team to manage HLD and other chronic conditions.   Interventions: . Collaboration with Malfi, NLupita Raider FNP regarding development and update of comprehensive plan of care as evidenced by provider attestation and co-signature . Inter-disciplinary care team collaboration (see longitudinal plan of care) . Medication review performed; medication list updated in electronic medical record.  .[facility] team collaboration (see longitudinal plan of care) . Referred to pharmacy team for assistance with HLD medication management . Evaluation of current treatment plan related to HLD and patient's adherence to plan as established by provider. 01-24-2021: The patient verbalized understanding of HLD plan of care. Confirms compliance with medications, and dietary restrictions.  . Advised patient to call the office for changes in condition or questions concerning chronic conditions  . Provided education to patient re: adherence to heart healthy/ADA diet, praised for monitoring blood pressures and supplying readings during outreaches  . Discussed plans with patient for ongoing care management follow up and provided patient with direct contact information for care management team   Patient Goals/Self-Care Activities: . Over the next 120 days, patient will:   - call for medicine refill 2 or 3 days before it runs out - call if I am sick and can't take my medicine - keep a list of all the medicines I  take; vitamins and herbals too - learn to read medicine labels - use a pillbox to sort medicine - use an alarm clock or phone to remind me to take my medicine - drink 6 to 8 glasses of water each day - eat 3 to 5 servings of fruits and vegetables each day - eat 5 or 6 small meals each day - fill half the plate with nonstarchy vegetables - limit fast food meals to no more than 1  per week - manage portion size - prepare main meal at home 3 to 5 days each week - read food labels for fat, fiber, carbohydrates and portion size - be open to making changes - I can manage, know and watch for signs of a heart attack - if I have chest pain, call for help - learn about small changes that will make a big difference - learn my personal risk factors - barriers to meeting goals identified - change-talk evoked - choices provided - collaboration with team encouraged - decision-making supported - health risks reviewed - problem-solving facilitated - questions answered - readiness for change evaluated - reassurance provided - resources needed to meet goals identified - self-reflection promoted - self-reliance encouraged  Follow Up Plan: Telephone follow up appointment with care management team member scheduled for: 03-31-2021 at 0900 am     Task: RNCM: Mutually Develop and Royce Macadamia Achievement of Patient Goals   Note:   Care Management Activities:    - barriers to meeting goals identified - change-talk evoked - choices provided - collaboration with team encouraged - decision-making supported - health risks reviewed - problem-solving facilitated - questions answered - readiness for change evaluated - reassurance provided - resources needed to meet goals identified - self-reflection promoted - self-reliance encouraged       Patient Care Plan: PharmD - Medication Mgmt    Problem Identified: Disease Progression     Long-Range Goal: Disease Progression Prevented or Minimized   Start Date: 11/01/2020  Expected End Date: 01/30/2021  This Visit's Progress: On track  Recent Progress: On track  Priority: High  Note:   Current Barriers:  . Financial Barriers . Limited social support  Pharmacist Clinical Goal(s):  [name] Over the next 90 days, patient will maintain adherence to monitoring guidelines and medication adherence to achieve therapeutic efficacy through  collaboration with PharmD and provider.   Interventions: . 1:1 collaboration with Olin Hauser, DO regarding development and update of comprehensive plan of care as evidenced by provider attestation and co-signature . Inter-disciplinary care team collaboration (see longitudinal plan of care) . Perform chart review o Patient seen for Office Visit with Kathrine Haddock, NP on 4/5 for T2DM, weight loss, recent falls and insomnia. Provider advised patient: - Stop lisinopril due to recent falls and low BP in office - Stop metformin - Return for follow up in 2 weeks - Referral placed to [facility] [facility] Social Work[facility] for assistance with obtaining new cane and lift chair . Today receive message from Care Guide advising patient's missed appointment with CCM Pharmacist has been rescheduled, but patient requesting a call back regarding monitoring paperwork.  . Reach patient by phone. Ms. Bardales reports that she is currently leaving an appointment. Reports that she made medication changes as directed at latest PCP office visit and requests CM Pharmacist mail logs for her to monitor home blood pressure and blood sugar. Patient denies further medication questions/concerns today. o Will mail patient blood pressure and blood sugar logs . Will follow up with patient as  scheduled by care guide.   Patient Goals/Self-Care Activities . Over the next 90 days, patient will:  - take medications as prescribed  Using pill packaging from [facility] - check glucose, document, and provide at future appointments - check blood pressure, document, and provide at future appointments  Follow Up Plan: Telephone follow up appointment with care management team member scheduled for: 5/18 at 12:30 pm      The patient verbalized understanding of instructions, educational materials, and care plan provided today and declined offer to receive copy of patient instructions, educational materials, and care plan.   Telephone  follow up appointment with care management team member scheduled for: 03-31-2021 at 0900 am  [name] RN, MSN, [place] Mobile: [phone]

## 2021-01-26 ENCOUNTER — Ambulatory Visit: Payer: Self-pay | Admitting: Licensed Clinical Social Worker

## 2021-01-26 DIAGNOSIS — G8929 Other chronic pain: Secondary | ICD-10-CM

## 2021-01-26 DIAGNOSIS — F324 Major depressive disorder, single episode, in partial remission: Secondary | ICD-10-CM

## 2021-01-26 DIAGNOSIS — M545 Low back pain, unspecified: Secondary | ICD-10-CM

## 2021-01-26 DIAGNOSIS — E1142 Type 2 diabetes mellitus with diabetic polyneuropathy: Secondary | ICD-10-CM

## 2021-01-27 ENCOUNTER — Telehealth: Payer: Self-pay | Admitting: Family Medicine

## 2021-01-27 NOTE — Telephone Encounter (Signed)
Patient states she would like glucerna mailed to her home, please note when done.   [address]  [place] [id number]

## 2021-01-27 NOTE — Telephone Encounter (Signed)
Called patient and she has appt 02/02/21 @ 10:20.  We will give her samples then.

## 2021-01-31 NOTE — Patient Instructions (Signed)
Visit Information  Goals Addressed              This Visit's Progress     Patient Stated   .  "I probably could benefit from more support." (pt-stated)   On track     Patient Self Care Activities:  . Attend all scheduled provider appointments. Patient agreed to schedule follow up with PCP . Calls provider office for new concerns or questions . Utilize healthy coping skills discussed and continue to comply with med management . Follow up with medical supply to discuss expected costs for lift    .  SW-I probably could use some extra support right now. (pt-stated)   On track     Patient Self Care Activities:  . Attend all scheduled provider appointments. Patient agreed to schedule follow up with PCP . Calls provider office for new concerns or questions . Utilize healthy coping skills discussed and continue to comply with med management . Follow up with medical supply to discuss expected costs for lift       Patient verbalizes understanding of instructions provided today.   Telephone follow up appointment with care management team member scheduled for:06/`15/22   Christa See, MSW, [email or web address] Phone [phone] 6:48 AM

## 2021-01-31 NOTE — Chronic Care Management (AMB) (Signed)
Chronic Care Management    Clinical Social Work Note  01/31/2021 Name: Kendra [name] MRN: [id number] DOB: [birth year]  Kendra [name] Dejonge is a 75 y.o. year old female who is a primary care patient of [name], [name], FNP. The CCM team was consulted to assist the patient with chronic disease management and/or care coordination needs related to: Transportation Needs , Level of Care Concerns and Mental Health Counseling and Resources.   Engaged with patient by telephone for follow up visit in response to provider referral for social work chronic care management and care coordination services.   Consent to Services:  The patient was given information about Chronic Care Management services, agreed to services, and gave verbal consent prior to initiation of services.  Please see initial visit note for detailed documentation.   Patient agreed to services and consent obtained.   Assessment: Patient is engaged in conversation, continues to maintain positive progress with care plan goals. Patient was informed of costs associated with requested lift and agreed to follow up with medical supply to determine co-pay amount. She continues to comply with med management and receive strong support from family. CCM LCSW will provide additional transportation resources. See Care Plan below for interventions and patient self-care actives. Recent life changes [name]: Financial strain Recommendation: Patient may benefit from, and is in agreement to continue compliance with med management and utilization of healthy coping skills.  Follow up Plan: Patient would like continued follow-up.  CCM LCSW will follow up with patient on 03/03/21. Patient will call office if needed prior to next encounter.  SDOH (Social Determinants of Health) assessments and interventions performed:    Advanced Directives Status: Not addressed in this encounter.  CCM Care Plan  Allergies  Allergen Reactions  . Citalopram Nausea And  Vomiting    Outpatient Encounter Medications as of 01/26/2021  Medication Sig  . albuterol (VENTOLIN HFA) 108 (90 Base) MCG/ACT inhaler Inhale 1-2 Puffs every 6 hours as needed Shortness of Breath and wheeze  . aspirin 325 MG tablet Take 325 mg by mouth daily.  [name] atorvastatin (LIPITOR) 40 MG tablet TAKE 1 TABLET BY MOUTH BEDTIME  . Blood Glucose Monitoring Suppl (ONETOUCH VERIO) w/Device KIT USE AS DIRECTED  . buPROPion (WELLBUTRIN XL) 150 MG 24 hr tablet Take 1 tablet by mouth every morning.  . clopidogrel (PLAVIX) 75 MG tablet TAKE 1 TABLET BY MOUTH ONCE DAILY  . cyclobenzaprine (FLEXERIL) 5 MG tablet Take 0.5-1 tablets (2.5-5 mg total) by mouth 3 (three) times daily as needed for muscle spasms. (Patient not taking: Reported on 01/24/2021)  . Fluticasone-Umeclidin-Vilant (TRELEGY ELLIPTA) 100-62.5-25 MCG/INH AEPB USE ONE INHALATION INTO THE LUNGS ONCE A DAY RINSE MOUTH AFTER EACH USE  . glucose blood (ONETOUCH VERIO) test strip Use as instructed  . Lancet Devices (ONE TOUCH DELICA LANCING DEV) MISC 1 Device by Does not apply route 2 (two) times daily.  [name] Lancets 63A MISC 1 Device by Does not apply route 2 (two) times daily.  . traZODone (DESYREL) 50 MG tablet Take 0.5-1 tablets (25-50 mg total) by mouth at bedtime as needed for sleep.   No facility-administered encounter medications on file as of 01/26/2021.    Patient Active Problem List   Diagnosis Date Noted  . Recurrent falls 01/24/2021  . Loss of taste 09/23/2020  . Vitamin B12 deficiency 08/20/2020  . Personal history of tobacco use, presenting hazards to health 08/20/2020  . Normocytic anemia 06/15/2020  . Colon cancer screening 05/25/2020  . Right  hand pain 01/29/2020  . Visit for suture removal 01/23/2020  . Encounter for screening colonoscopy 01/23/2020  . Carotid artery stenosis 12/30/2019  . 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 Weakness of left upper extremity  10/16/2019  . Left-sided weakness 10/16/2019  . Hypertension associated with diabetes ([place]) 12/19/2018  . Chronic midline low back pain without sciatica 05/07/2018  . Radiculopathy of leg 04/01/2017  . Calcification of aorta (HCC) 09/27/2016  . Early satiety 07/26/2016  . CMC arthritis, thumb, degenerative 07/16/2015  . Weight loss 06/07/2015  . Tenosynovitis of wrist 06/07/2015  . Protein calorie malnutrition ([place]) 06/07/2015  . Diabetes mellitus type 2, controlled, with complications ([place]) 47/05/6282  . Cataract 03/30/2015  . Other and unspecified disc disorder of cervical region 03/30/2015  . Insomnia 03/30/2015  . Centrilobular emphysema ([place]) 03/30/2015  . Clinical depression 03/30/2015  . Hyperlipidemia associated with type 2 diabetes mellitus ([place]) 03/30/2015  . Gastro-esophageal reflux disease without esophagitis 03/30/2015  . Increased thickness of nail 03/30/2015  . History of cerebrovascular accident with residual deficit 03/30/2015  . Osteoarthritis 03/30/2015  . Osteopenia 03/30/2015  . Allergic rhinitis 03/30/2015  . B12 deficiency 03/30/2015  . Tobacco use disorder 03/30/2015  . Vitamin D deficiency 03/30/2015  . Mycotic toenails 08/12/2014  . Diabetic peripheral neuropathy associated with type 2 diabetes mellitus ([place]) 08/12/2014    Conditions to be addressed/monitored: Depression; Transportation, Level of care concerns and Mental Health Concerns   Care Plan : General Social Work (Adult)  Updates made by [name], LCSW since 01/31/2021 12:00 AM    Problem: Coping Skills (General Plan of Care)     Long-Range Goal: Coping Skills Enhanced   Start Date: 12/14/2020  This Visit's Progress: On track  Recent Progress: On track  Priority: Medium  Note:   Timeframe:  Long-Range Goal Priority:  Medium Start Date:   12/14/20                    Expected End Date: 05/18/21                Follow Up Date -03/02/21  Current Barriers:  . Financial  constraints . Limited social support . ADL IADL limitations . Mental Health Concerns  . Social Isolation . Limited access to caregiver . Inability to perform ADL's independently . Inability to perform IADL's independently . Lacks knowledge of community resource: grief support resources within the area  Clinical Social Work Clinical Goal(s):  [name] Over the next 120 days, patient will work with SW to address concerns related to gaining additional support within the home and resource connection in order to maintain health and independency within the community  . Over the next 120 days, patient will demonstrate improved adherence to self care as evidenced by implementing healthy self-care into her daily routine such as: attending all medical appointments, taking time for self-reflection, taking medications as prescribed, drinking water and daily exercise to improve mobility.  Interventions: . Patient interviewed and appropriate assessments performed . Patient reports a decrease in depression symptoms that were previously triggered by psychosocial stressors. Her son and grandchildren checks-in with her routinely  . Patient is content with current living arrangements and is not interested in housing resources at this time . Patient reports need for lift. States that she is at risk for falling when she gets up from her living room set that "sits too low and causes my feet to slide" Pt shared that she had  a fall on 05/05 and broke a bone in her nose, in addition, to sustaining two knots on her head. She stated that she was informed there was no need to schedule an appt with ENT to follow up.  [name] CCM LCSW informed patient that insurance will cover approximately $300 for lift and she'd have to pay the remainder . CCM LCSW strongly encouraged patient to schedule a follow up appointment with PCP . Patient is home alone for majority of the day. States she has fallen "a many of times" She doesn't utilize cane or  her walker with a seat because they don't provide her enough support . Patient agreed to picking up Glucerna at her next scheduled appt. She utilizes [employer] merchand[employer] for transportation; however, reports financial strain. CCM LCSW will follow up regarding pt's eligibility with [facility] . Patient has scheduled transportation for upcoming lab appt. Patient agreed to schedule a follow up appt with PCP  . Patient reports compliance with medication management to assist with management of anxiety/depression . Discussed plans with patient for ongoing care management follow up and provided patient with direct contact information for care management team . Evaluated patient's stress level. Emotional Support provided due to ongoing health complications.  [name] 1:1 collaboration with PCP regarding development and update of comprehensive plan of care as evidenced by provider attestation and co-signature  . Discussed plans with patient for ongoing care management follow up and provided patient with direct contact information for care management team . Advised patient to contact CCM providers if she has any concerns in regards to her safety, housing or well-being. Patient reports being currently stable.   Patient Self Care Activities:  . Attend all scheduled provider appointments. Patient agreed to schedule follow up with PCP . Calls provider office for new concerns or questions . Utilize healthy coping skills discussed and continue to comply with med management . Follow up with medical supply to discuss expected costs for lift       Christa See, MSW, [email or web address] Phone [phone] 6:46 AM

## 2021-02-01 ENCOUNTER — Other Ambulatory Visit: Payer: Self-pay

## 2021-02-01 ENCOUNTER — Inpatient Hospital Stay: Payer: Medicare Other | Attending: Oncology

## 2021-02-01 DIAGNOSIS — Z79899 Other long term (current) drug therapy: Secondary | ICD-10-CM | POA: Diagnosis not present

## 2021-02-01 DIAGNOSIS — F17210 Nicotine dependence, cigarettes, uncomplicated: Secondary | ICD-10-CM | POA: Insufficient documentation

## 2021-02-01 DIAGNOSIS — D649 Anemia, unspecified: Secondary | ICD-10-CM | POA: Insufficient documentation

## 2021-02-01 DIAGNOSIS — E538 Deficiency of other specified B group vitamins: Secondary | ICD-10-CM | POA: Diagnosis not present

## 2021-02-01 LAB — COMPREHENSIVE METABOLIC PANEL
ALT: 18 U/L (ref 0–44)
AST: 17 U/L (ref 15–41)
Albumin: 3.9 g/dL (ref 3.5–5.0)
Alkaline Phosphatase: 64 U/L (ref 38–126)
Anion gap: 8 (ref 5–15)
BUN: 13 mg/dL (ref 8–23)
CO2: 27 mmol/L (ref 22–32)
Calcium: 9.5 mg/dL (ref 8.9–10.3)
Chloride: 102 mmol/L (ref 98–111)
Creatinine, Ser: 0.82 mg/dL (ref 0.44–1.00)
GFR, Estimated: >60 mL/min (ref >60)
Glucose, Bld: 138 mg/dL — ABNORMAL HIGH (ref 70–99)
Potassium: 4.1 mmol/L (ref 3.5–5.1)
Sodium: 137 mmol/L (ref 135–145)
Total Bilirubin: 0.5 mg/dL (ref 0.3–1.2)
Total Protein: 7 g/dL (ref 6.5–8.1)

## 2021-02-01 LAB — CBC WITH DIFFERENTIAL/PLATELET
Abs Immature Granulocytes: 0.01 10*3/uL (ref 0.00–0.07)
Basophils Absolute: 0 10*3/uL (ref 0.0–0.1)
Basophils Relative: 1 %
Eosinophils Absolute: 0.2 10*3/uL (ref 0.0–0.5)
Eosinophils Relative: 4 %
HCT: 33.8 % — ABNORMAL LOW (ref 36.0–46.0)
Hemoglobin: 11.2 g/dL — ABNORMAL LOW (ref 12.0–15.0)
Immature Granulocytes: 0 %
Lymphocytes Relative: 34 %
Lymphs Abs: 1.5 10*3/uL (ref 0.7–4.0)
MCH: 31.5 pg (ref 26.0–34.0)
MCHC: 33.1 g/dL (ref 30.0–36.0)
MCV: 95.2 fL (ref 80.0–100.0)
Monocytes Absolute: 0.2 10*3/uL (ref 0.1–1.0)
Monocytes Relative: 6 %
Neutro Abs: 2.4 10*3/uL (ref 1.7–7.7)
Neutrophils Relative %: 55 %
Platelets: 205 10*3/uL (ref 150–400)
RBC: 3.55 MIL/uL — ABNORMAL LOW (ref 3.87–5.11)
RDW: 13.8 % (ref 11.5–15.5)
WBC: 4.3 10*3/uL (ref 4.0–10.5)
nRBC: 0 % (ref 0.0–0.2)

## 2021-02-01 LAB — VITAMIN B12: Vitamin B-12: 326 pg/mL (ref 180–914)

## 2021-02-02 ENCOUNTER — Ambulatory Visit (INDEPENDENT_AMBULATORY_CARE_PROVIDER_SITE_OTHER): Payer: Medicare Other | Admitting: Internal Medicine

## 2021-02-02 ENCOUNTER — Ambulatory Visit
Admission: RE | Admit: 2021-02-02 | Discharge: 2021-02-02 | Disposition: A | Discharge status: Home / Self Care | Payer: Medicare Other | Source: Ambulatory Visit | Attending: Internal Medicine | Admitting: Internal Medicine

## 2021-02-02 ENCOUNTER — Telehealth: Payer: Medicare Other

## 2021-02-02 ENCOUNTER — Other Ambulatory Visit: Payer: Self-pay

## 2021-02-02 ENCOUNTER — Encounter: Payer: Self-pay | Admitting: Internal Medicine

## 2021-02-02 ENCOUNTER — Ambulatory Visit
Admission: RE | Admit: 2021-02-02 | Discharge: 2021-02-02 | Disposition: A | Discharge status: Home / Self Care | Payer: Medicare Other | Attending: Internal Medicine | Admitting: Internal Medicine

## 2021-02-02 VITALS — BP 133/76 | HR 78 | Temp 97.3°F | Ht 63.0 in | Wt 125.0 lb

## 2021-02-02 DIAGNOSIS — I69354 Hemiplegia and hemiparesis following cerebral infarction affecting left non-dominant side: Secondary | ICD-10-CM | POA: Diagnosis not present

## 2021-02-02 DIAGNOSIS — E1159 Type 2 diabetes mellitus with other circulatory complications: Secondary | ICD-10-CM

## 2021-02-02 DIAGNOSIS — M8580 Other specified disorders of bone density and structure, unspecified site: Secondary | ICD-10-CM

## 2021-02-02 DIAGNOSIS — M79642 Pain in left hand: Secondary | ICD-10-CM

## 2021-02-02 DIAGNOSIS — K219 Gastro-esophageal reflux disease without esophagitis: Secondary | ICD-10-CM

## 2021-02-02 DIAGNOSIS — F3341 Major depressive disorder, recurrent, in partial remission: Secondary | ICD-10-CM | POA: Diagnosis not present

## 2021-02-02 DIAGNOSIS — W010XXA Fall on same level from slipping, tripping and stumbling without subsequent striking against object, initial encounter: Secondary | ICD-10-CM

## 2021-02-02 DIAGNOSIS — Y929 Unspecified place or not applicable: Secondary | ICD-10-CM | POA: Insufficient documentation

## 2021-02-02 DIAGNOSIS — E118 Type 2 diabetes mellitus with unspecified complications: Secondary | ICD-10-CM

## 2021-02-02 DIAGNOSIS — Y939 Activity, unspecified: Secondary | ICD-10-CM | POA: Diagnosis not present

## 2021-02-02 DIAGNOSIS — F5101 Primary insomnia: Secondary | ICD-10-CM

## 2021-02-02 DIAGNOSIS — I152 Hypertension secondary to endocrine disorders: Secondary | ICD-10-CM

## 2021-02-02 DIAGNOSIS — E1169 Type 2 diabetes mellitus with other specified complication: Secondary | ICD-10-CM

## 2021-02-02 DIAGNOSIS — M158 Other polyosteoarthritis: Secondary | ICD-10-CM

## 2021-02-02 DIAGNOSIS — E785 Hyperlipidemia, unspecified: Secondary | ICD-10-CM

## 2021-02-02 DIAGNOSIS — I6930 Unspecified sequelae of cerebral infarction: Secondary | ICD-10-CM

## 2021-02-02 DIAGNOSIS — I6523 Occlusion and stenosis of bilateral carotid arteries: Secondary | ICD-10-CM

## 2021-02-02 NOTE — Progress Notes (Signed)
Subjective:    Patient ID: Kendra [name], female    DOB: 1946/03/10, 75 y.o.   MRN: [id number]  HPI  Patient presents to clinic today for follow-up of chronic conditions.  She is establishing care with me today, transferring care from [facility], NP.  HTN: Her BP today is 133/76.  She is not taking any antihypertensive medication at this time.  ECG from 11/2020 reviewed.  HLD with Carotid Artery Disease s/p CVA: With residual left-sided weakness.  Her last LDL was 26, triglycerides 70, 12/2020.  She denies myalgias on Atorvastatin.  She is taking Aspirin as well.  She does not consume a low-fat diet.  DM2:: Her last A1c was 5.8%, 12/2020.  She is not taking any oral diabetic medication at this time.  She does not check her sugars.  She checks her feet routinely.  Her last eye exam was in 2021.  Flu 05/2019.  Pneumovax 07/2018.  Prevnar 10/2014.  COVID-Pfizer x2.  COPD: She denies chronic cough or SOB.  She is taking Trelegy and Albuterol as prescribed. There are no PFTs on file.  GERD: Triggered by spicy foods.  She is not taking a PPI or H2 blocker at this time.  There is no upper GI on file.  OA: Mainly in her back.  She takes Flexeril as needed with good relief of symptoms.  Osteopenia: Her last bone density was in 2011.  She is not currently taking any Calcium or Vitamin D OTC.  She tries to get weightbearing exercise and daily.  Depression: She is taking Trazodone as prescribed.  She is not currently seeing a therapist.  She denies anxiety, SI/HI.  Insomnia: She has difficulty falling asleep and staying.  She is taking trazodone as prescribed.  There is no sleep study on file.  Anemia: Her last H/H was 11.2/33.8, 01/2021.  She follows with hematology.  She also reports  pain in her left hand.  She reports this started after a fall that occurred a few weeks ago.  She describes the pain as sharp.  The pain does not radiate.  She has noticed that she is having difficulty making a fist  with her left hand.  She denies numbness or tingling of her fingers but does report some associated weakness.  She has not taken anything OTC for this.  Review of Systems  Past Medical History:  Diagnosis Date  . Asthma   . Back pain   . Cataract   . Cervical disc disorder   . COPD (chronic obstructive pulmonary disease) ([place])   . Depression   . Diabetes ([place])    type II  . Diabetic neuropathy ([place])   . Dyspnea    with exertion  . GERD (gastroesophageal reflux disease)   . History of kidney stones    per patient "can't remember the year"  . Hyperlipidemia   . Hypertension    per patient "take meds for it"  . Insomnia   . Neuropathy   . Osteoarthritis   . Osteopenia   . Pneumonia 2018   per patient  . Reflux   . Rheumatoid arthritis ([place])   . Stroke ([place]) 2015   and again 2017  - Weakness in left leg, staggers w/ walking, vision   . Tenosynovitis     Current Outpatient Medications  Medication Sig Dispense Refill  . albuterol (VENTOLIN HFA) 108 (90 Base) MCG/ACT inhaler Inhale 1-2 Puffs every 6 hours as needed Shortness of Breath and wheeze 8.5 g 1  .  aspirin 325 MG tablet Take 325 mg by mouth daily.    [name] atorvastatin (LIPITOR) 40 MG tablet TAKE 1 TABLET BY MOUTH BEDTIME 90 tablet 0  . Blood Glucose Monitoring Suppl (ONETOUCH VERIO) w/Device KIT USE AS DIRECTED 1 kit 0  . buPROPion (WELLBUTRIN XL) 150 MG 24 hr tablet Take 1 tablet by mouth every morning.    . clopidogrel (PLAVIX) 75 MG tablet TAKE 1 TABLET BY MOUTH ONCE DAILY 90 tablet 1  . cyclobenzaprine (FLEXERIL) 5 MG tablet Take 0.5-1 tablets (2.5-5 mg total) by mouth 3 (three) times daily as needed for muscle spasms. (Patient not taking: Reported on 01/24/2021) 30 tablet 1  . Fluticasone-Umeclidin-Vilant (TRELEGY ELLIPTA) 100-62.5-25 MCG/INH AEPB USE ONE INHALATION INTO THE LUNGS ONCE A DAY RINSE MOUTH AFTER EACH USE 60 each 1  . glucose blood (ONETOUCH VERIO) test strip Use as instructed 200 each 4  . Lancet Devices  (ONE TOUCH DELICA LANCING DEV) MISC 1 Device by Does not apply route 2 (two) times daily. 200 each 4  . OneTouch Delica Lancets 46E MISC 1 Device by Does not apply route 2 (two) times daily. 200 each 4  . traZODone (DESYREL) 50 MG tablet Take 0.5-1 tablets (25-50 mg total) by mouth at bedtime as needed for sleep. 90 tablet 1   No current facility-administered medications for this visit.    Allergies  Allergen Reactions  . Citalopram Nausea And Vomiting    Family History  Problem Relation Age of Onset  . Stroke Father   . Depression Father   . Diabetes Mother   . Hyperlipidemia Mother   . Breast cancer Mother   . Heart murmur Son   . Heart murmur Son   . Heart murmur Son     Social History   Socioeconomic History  . Marital status: Widowed    Spouse name: Not on file  . Number of children: Not on file  . Years of education: Not on file  . Highest education level: Not on file  Occupational History  . Not on file  Tobacco Use  . Smoking status: Current Some Day Smoker    Packs/day: 0.10    Years: 61.00    Pack years: 6.10    Types: Cigarettes  . Smokeless tobacco: Former Systems developer    Types: Snuff  . Tobacco comment: .5 of 1 cigarette a day  Vaping Use  . Vaping Use: Never used  Substance and Sexual Activity  . Alcohol use: No  . Drug use: No  . Sexual activity: Never  Other Topics Concern  . Not on file  Social History Narrative  . Not on file   Social Determinants of Health   Financial Resource Strain: Low Risk   . Difficulty of Paying Living Expenses: Not very hard  Food Insecurity: No Food Insecurity  . Worried About Charity fundraiser in the Last Year: Never true  . Ran Out of Food in the Last Year: Never true  Transportation Needs: No Transportation Needs  . Lack of Transportation (Medical): No  . Lack of Transportation (Non-Medical): No  Physical Activity: Inactive  . Days of Exercise per Week: 0 days  . Minutes of Exercise per Session: 0 min  Stress:  No Stress Concern Present  . Feeling of Stress : Only a little  Social Connections: Socially Isolated  . Frequency of Communication with Friends and Family: More than three times a week  . Frequency of Social Gatherings with Friends and Family: More than three  times a week  . Attends Religious Services: Never  . Active Member of Clubs or Organizations: No  . Attends Archivist Meetings: Never  . Marital Status: Widowed  Intimate Partner Violence: Not At Risk  . Fear of Current or Ex-Partner: No  . Emotionally Abused: No  . Physically Abused: No  . Sexually Abused: No     Constitutional: Patient reports intentional weight gain.  Denies fever, malaise, fatigue, headache.  HEENT: Denies eye pain, eye redness, ear pain, ringing in the ears, wax buildup, runny nose, nasal congestion, bloody nose, or sore throat. Respiratory: Denies difficulty breathing, shortness of breath, cough or sputum production.   Cardiovascular: Denies chest pain, chest tightness, palpitations or swelling in the hands or feet.  Gastrointestinal: Denies abdominal pain, bloating, constipation, diarrhea or blood in the stool.  GU: Denies urgency, frequency, pain with urination, burning sensation, blood in urine, odor or discharge. Musculoskeletal: Patient reports intermittent joint pain, chronic left-sided weakness, left hand pain and weakness.  Denies decrease in range of motion, difficulty with gait, muscle pain or joint swelling.  Skin: Denies redness, rashes, lesions or ulcercations.  Neurological: Patient reports difficulty with balance, history of insomnia.  Denies dizziness, difficulty with memory, difficulty with speech or problems with coordination.  Psych: Patient has a history of depression.  Denies anxiety, SI/HI.  No other specific complaints in a complete review of systems (except as listed in HPI above).     Objective:   Physical Exam   BP 133/76 (BP Location: Right Arm, Patient Position:  Sitting, Cuff Size: Normal)   Pulse 78   Temp (!) 97.3 F (36.3 C) (Temporal)   Ht _0  (1.6 m)   Wt 125 lb (56.7 kg)   BMI 22.14 kg/m   Wt Readings from Last 3 Encounters:  12/21/20 117 lb 6.4 oz (53.3 kg)  11/18/20 123 lb (55.8 kg)  09/23/20 123 lb 9.6 oz (56.1 kg)    General: Appears her stated age, chronically ill-appearing, in NAD. Skin: Warm, dry and intact. No ulcerations noted. HEENT: Head: normal shape and size; Eyes: sclera white and EOMs intact; Neck:  Neck supple, trachea midline. No masses, lumps or thyromegaly present.  Cardiovascular: Normal rate and rhythm. S1,S2 noted.  No murmur, rubs or gallops noted. No JVD or BLE edema. No carotid bruits noted. Pulmonary/Chest: Normal effort and positive vesicular breath sounds. No respiratory distress. No wheezes, rales or ronchi noted.  Musculoskeletal: Decreased flexion of the fourth and fifth fingers of the left hand.  Pain with palpation over the fourth and fifth metatarsals of the left hand.  Handgrips equal.  Gait slow, slightly unsteady without assistive device. Neurological: Alert and oriented.  Psychiatric: Mood and affect flat. Behavior is normal. Judgment and thought content normal.     BMET    Component Value Date/Time   NA 137 02/01/2021 1310   NA 141 01/18/2016 1150   NA 138 09/01/2014 2130   K 4.1 02/01/2021 1310   K 3.5 09/01/2014 2130   CL 102 02/01/2021 1310   CL 104 09/01/2014 2130   CO2 27 02/01/2021 1310   CO2 28 09/01/2014 2130   GLUCOSE 138 (H) 02/01/2021 1310   GLUCOSE 287 (H) 09/01/2014 2130   BUN 13 02/01/2021 1310   BUN 12 01/18/2016 1150   BUN 14 09/01/2014 2130   CREATININE 0.82 02/01/2021 1310   CREATININE 0.82 12/21/2020 1134   CALCIUM 9.5 02/01/2021 1310   CALCIUM 8.9 09/01/2014 2130   GFRNONAA >60 02/01/2021  1310   GFRNONAA 70 06/14/2020 0929   GFRAA 82 06/14/2020 0929    Lipid Panel     Component Value Date/Time   CHOL 92 12/21/2020 1134   CHOL 177 06/07/2015 1215    CHOL 141 09/02/2014 0440   TRIG 70 12/21/2020 1134   TRIG 155 09/02/2014 0440   HDL 51 12/21/2020 1134   HDL 56 06/07/2015 1215   HDL 37 (L) 09/02/2014 0440   CHOLHDL 1.8 12/21/2020 1134   VLDL 16 10/17/2019 0606   VLDL 31 09/02/2014 0440   LDLCALC 26 12/21/2020 1134   LDLCALC 73 09/02/2014 0440    CBC    Component Value Date/Time   WBC 4.3 02/01/2021 1310   RBC 3.55 (L) 02/01/2021 1310   HGB 11.2 (L) 02/01/2021 1310   HGB 12.7 09/01/2014 2130   HCT 33.8 (L) 02/01/2021 1310   HCT 37.9 09/01/2014 2130   PLT 205 02/01/2021 1310   PLT 260 09/05/2014 0517   MCV 95.2 02/01/2021 1310   MCV 92 09/01/2014 2130   MCH 31.5 02/01/2021 1310   MCHC 33.1 02/01/2021 1310   RDW 13.8 02/01/2021 1310   RDW 13.9 09/01/2014 2130   LYMPHSABS 1.5 02/01/2021 1310   MONOABS 0.2 02/01/2021 1310   EOSABS 0.2 02/01/2021 1310   BASOSABS 0.0 02/01/2021 1310    Hgb A1C Lab Results  Component Value Date   HGBA1C 5.8 (A) 12/21/2020           Assessment & Plan:  Left Hand Pain status post Fall:  X-ray left hand today Encouraged RICE therapy  We will follow-up after x-ray, RTC in 6 months for follow-up chronic conditions  Webb Silversmith, NP This visit occurred during the SARS-CoV-2 public health emergency.  Safety protocols were in place, including screening questions prior to the visit, additional usage of staff PPE, and extensive cleaning of exam room while observing appropriate contact time as indicated for disinfecting solutions.

## 2021-02-02 NOTE — Assessment & Plan Note (Signed)
Encourage Flexeril only at bedtime as this can cause sedation and contribute to frequent falls

## 2021-02-02 NOTE — Assessment & Plan Note (Signed)
Chronic left hemiparesis Continue Atorvastatin and Aspirin

## 2021-02-02 NOTE — Assessment & Plan Note (Signed)
Status post CEA on the left Continue atorvastatin and aspirin as prescribed

## 2021-02-02 NOTE — Assessment & Plan Note (Signed)
Encouraged calcium and vitamin D daily Encouraged daily weightbearing exercise

## 2021-02-02 NOTE — Assessment & Plan Note (Signed)
A1c reviewed Encouraged her to consume a low-carb diet No medications at this time Encouraged her to get yearly eye exams Foot exam today Encouraged her to get her COVID booster Flu, Pneumovax and Prevnar UTD

## 2021-02-02 NOTE — Assessment & Plan Note (Signed)
Causing difficulty with balance and frequent falls Consider PT if becomes a recurrent issue

## 2021-02-02 NOTE — Assessment & Plan Note (Signed)
Lipid profile reviewed Encouraged her to consume a low-fat diet Continue Atorvastatin 

## 2021-02-02 NOTE — Assessment & Plan Note (Signed)
No issues off meds Advised her to avoid foods that trigger her reflux Will monitor

## 2021-02-02 NOTE — Assessment & Plan Note (Signed)
Stable on Trazodone Will monitor

## 2021-02-02 NOTE — Assessment & Plan Note (Signed)
Stable on Trazodone Support offered

## 2021-02-02 NOTE — Assessment & Plan Note (Signed)
Controlled off meds Reinforced DASH diet Will monitor 

## 2021-02-02 NOTE — Patient Instructions (Signed)

## 2021-02-04 ENCOUNTER — Emergency Department
Admission: EM | Admit: 2021-02-04 | Discharge: 2021-02-04 | Disposition: A | Discharge status: Home / Self Care | Payer: Medicare Other | Attending: Emergency Medicine | Admitting: Emergency Medicine

## 2021-02-04 ENCOUNTER — Other Ambulatory Visit: Payer: Self-pay

## 2021-02-04 ENCOUNTER — Emergency Department: Payer: Medicare Other

## 2021-02-04 DIAGNOSIS — Z7951 Long term (current) use of inhaled steroids: Secondary | ICD-10-CM | POA: Diagnosis not present

## 2021-02-04 DIAGNOSIS — W01198A Fall on same level from slipping, tripping and stumbling with subsequent striking against other object, initial encounter: Secondary | ICD-10-CM | POA: Insufficient documentation

## 2021-02-04 DIAGNOSIS — J449 Chronic obstructive pulmonary disease, unspecified: Secondary | ICD-10-CM | POA: Diagnosis not present

## 2021-02-04 DIAGNOSIS — Y9389 Activity, other specified: Secondary | ICD-10-CM | POA: Insufficient documentation

## 2021-02-04 DIAGNOSIS — Z7982 Long term (current) use of aspirin: Secondary | ICD-10-CM | POA: Insufficient documentation

## 2021-02-04 DIAGNOSIS — Y92009 Unspecified place in unspecified non-institutional (private) residence as the place of occurrence of the external cause: Secondary | ICD-10-CM | POA: Diagnosis not present

## 2021-02-04 DIAGNOSIS — I10 Essential (primary) hypertension: Secondary | ICD-10-CM | POA: Insufficient documentation

## 2021-02-04 DIAGNOSIS — E1140 Type 2 diabetes mellitus with diabetic neuropathy, unspecified: Secondary | ICD-10-CM | POA: Diagnosis not present

## 2021-02-04 DIAGNOSIS — F17210 Nicotine dependence, cigarettes, uncomplicated: Secondary | ICD-10-CM | POA: Diagnosis not present

## 2021-02-04 DIAGNOSIS — S2242XA Multiple fractures of ribs, left side, initial encounter for closed fracture: Secondary | ICD-10-CM

## 2021-02-04 DIAGNOSIS — J45909 Unspecified asthma, uncomplicated: Secondary | ICD-10-CM | POA: Insufficient documentation

## 2021-02-04 DIAGNOSIS — S20212A Contusion of left front wall of thorax, initial encounter: Secondary | ICD-10-CM | POA: Diagnosis not present

## 2021-02-04 DIAGNOSIS — S299XXA Unspecified injury of thorax, initial encounter: Secondary | ICD-10-CM | POA: Diagnosis present

## 2021-02-04 MED ORDER — OXYCODONE-ACETAMINOPHEN 5-325 MG PO TABS: 1.0000 | ORAL_TABLET | ORAL | 0 refills | Status: DC | PRN

## 2021-02-04 MED ORDER — OXYCODONE-ACETAMINOPHEN 5-325 MG PO TABS
2.0000 | ORAL_TABLET | Freq: Once | ORAL | Status: AC
Start: 2021-02-04 — End: 2021-02-04
  Administered 2021-02-04: 2 via ORAL
  Filled 2021-02-04: qty 2

## 2021-02-04 NOTE — ED Triage Notes (Signed)
EMS brings pt in from home; tripped at home landing on left side; c/o left rib pain with deep breathing

## 2021-02-04 NOTE — ED Notes (Signed)
Pt c/o left sided rib pain, reports she tripped and fell landing on the arm of a cough prior to arrival. Denies LOC or other injuries, denies CP/SOB, denies blood thinners. Reports pain is tender to the touch, worse when moving.

## 2021-02-04 NOTE — ED Notes (Signed)
Dr. Karma Greaser notified of pt's BP, states to let pt metabolize percocet and continue to monitor. No new orders at this time, oncoming RN notified.

## 2021-02-04 NOTE — ED Notes (Signed)
Attempted to call multiple people to pickup for discharge home. No answer at this time. Patient AOx4. Patient placed in wheelchair and in lobby. Nira Conn, First Nurse aware of patient.

## 2021-02-04 NOTE — ED Provider Notes (Signed)
[facility] Emergency Department Provider Note  ____________________________________________   Event Date/Time   First MD Initiated Contact with Patient 02/04/21 [phone]     (approximate)  I have reviewed the triage vital signs and the nursing notes.   HISTORY  Chief Complaint Rib Injury    HPI Kendra [name] is a 75 y.o. female with medical history as listed below who presents for evaluation of cute onset and moderate to severe left-sided rib pain after a fall.  She says that  she tripped over and landed on the couch hitting the left lateral lower part of her ribs.  She has no pain anywhere else.  She did not strike her head and did not lose consciousness.  She has no headache or neck pain.  She has no difficulty breathing but does have pain in the area upon deep inspiration.  Moving around makes the pain worse, nothing in particular makes it better other than shallow breaths and holding still.  No pain in her abdomen.  She is able to ambulate but it hurts in her ribs to do so.        Past Medical History:  Diagnosis Date  . Asthma   . Back pain   . Cataract   . Cervical disc disorder   . COPD (chronic obstructive pulmonary disease) ([place])   . Depression   . Diabetes ([place])    type II  . Diabetic neuropathy ([place])   . Dyspnea    with exertion  . GERD (gastroesophageal reflux disease)   . History of kidney stones    per patient "can't remember the year"  . Hyperlipidemia   . Hypertension    per patient "take meds for it"  . Insomnia   . Neuropathy   . Osteoarthritis   . Osteopenia   . Pneumonia 2018   per patient  . Reflux   . Rheumatoid arthritis ([place])   . Stroke ([place]) 2015   and again 2017  - Weakness in left leg, staggers w/ walking, vision   . Tenosynovitis     Patient Active Problem List   Diagnosis Date Noted  . Hemiparesis affecting left side as late effect of stroke ([place]) 02/02/2021  . Recurrent major depressive disorder, in  partial remission ([place]) 02/02/2021  . Carotid artery stenosis 12/30/2019  . Hypertension associated with diabetes ([place]) 12/19/2018  . Diabetes mellitus type 2, controlled, with complications ([place]) 65/99/3570  . Insomnia 03/30/2015  . Hyperlipidemia associated with type 2 diabetes mellitus ([place]) 03/30/2015  . Gastro-esophageal reflux disease without esophagitis 03/30/2015  . History of cerebrovascular accident with residual deficit 03/30/2015  . Osteoarthritis 03/30/2015  . Osteopenia 03/30/2015  . B12 deficiency 03/30/2015    Past Surgical History:  Procedure Laterality Date  . BREAST BIOPSY Right    Fatty Tumor  . ENDARTERECTOMY Right 10/21/2019   Procedure: ENDARTERECTOMY CAROTID RIGHT;  Surgeon: [name], MD;  Location: [place];  Service: Vascular;  Laterality: Right;  . ENDARTERECTOMY Left 12/30/2019   Procedure: LEFT CAROTID ENDARTERECTOMY;  Surgeon: [name], MD;  Location: [place];  Service: Vascular;  Laterality: Left;  . NECK SURGERY    . OVARIAN CYST REMOVAL    . PATCH ANGIOPLASTY Right 10/21/2019   Procedure: Patch Angioplasty Using XenoSure Biologic Patch Right Carotid;  Surgeon: [name], MD;  Location: [place];  Service: Vascular;  Laterality: Right;  . PATCH ANGIOPLASTY Left 12/30/2019   Procedure: Patch Angioplasty Left Carotid;  Surgeon: Donzetta Matters,  Georgia Dom, MD;  Location: [place];  Service: Vascular;  Laterality: Left;    Prior to Admission medications   Medication Sig Start Date End Date Taking? Authorizing Provider  albuterol (VENTOLIN HFA) 108 (90 Base) MCG/ACT inhaler Inhale 1-2 Puffs every 6 hours as needed Shortness of Breath and wheeze 12/08/20   Olin Hauser, DO  aspirin 325 MG tablet Take 325 mg by mouth daily.    [provider]  atorvastatin (LIPITOR) 40 MG tablet TAKE 1 TABLET BY MOUTH BEDTIME 01/03/21   Olin Hauser, DO  Blood Glucose Monitoring Suppl [facility] VERIO) w/Device  KIT USE AS DIRECTED 04/05/20   Malfi, [name], FNP  buPROPion (WELLBUTRIN XL) 150 MG 24 hr tablet Take 1 tablet by mouth every morning. 01/03/21   [provider]  cyclobenzaprine (FLEXERIL) 5 MG tablet Take 0.5-1 tablets (2.5-5 mg total) by mouth 3 (three) times daily as needed for muscle spasms. 01/06/21   Parks Ranger, Devonne Doughty, DO  Fluticasone-Umeclidin-Vilant (TRELEGY ELLIPTA) 100-62.5-25 MCG/INH AEPB USE ONE INHALATION INTO THE LUNGS ONCE A DAY RINSE MOUTH AFTER EACH USE 01/03/21   Parks Ranger, Devonne Doughty, DO  glucose blood (ONETOUCH VERIO) test strip Use as instructed 12/15/20   Olin Hauser, DO  Lancet Devices (ONE TOUCH DELICA LANCING DEV) MISC 1 Device by Does not apply route 2 (two) times daily. 01/02/20   Malfi, [name], FNP  OneTouch Delica Lancets 45O MISC 1 Device by Does not apply route 2 (two) times daily. 12/15/20   Karamalegos, Devonne Doughty, DO  traZODone (DESYREL) 50 MG tablet Take 0.5-1 tablets (25-50 mg total) by mouth at bedtime as needed for sleep. 12/21/20   Kathrine Haddock, NP    Allergies Citalopram  Family History  Problem Relation Age of Onset  . Stroke Father   . Depression Father   . Diabetes Mother   . Hyperlipidemia Mother   . Breast cancer Mother   . Heart murmur Son   . Heart murmur Son   . Heart murmur Son     Social History Social History   Tobacco Use  . Smoking status: Current Some Day Smoker    Packs/day: 0.10    Years: 61.00    Pack years: 6.10    Types: Cigarettes  . Smokeless tobacco: Former Systems developer    Types: Snuff  . Tobacco comment: .5 of 1 cigarette a day  Vaping Use  . Vaping Use: Never used  Substance Use Topics  . Alcohol use: No  . Drug use: No    Review of Systems Constitutional: No fever/chills Cardiovascular: Denies chest pain. Respiratory: Denies shortness of breath.  Pain with deep inspiration. Gastrointestinal: No abdominal pain.  No nausea, no vomiting.  No diarrhea.  No  constipation. Musculoskeletal: Pain and tenderness to the left lateral lower part of her ribs after a fall.  Negative for neck pain.  Negative for back pain. Integumentary: Negative for rash. Neurological: Negative for headaches, focal weakness or numbness.   ____________________________________________   PHYSICAL EXAM:  VITAL SIGNS: ED Triage Vitals  Enc Vitals Group     BP 02/04/21 0016 (!) 137/91     Pulse Rate 02/04/21 0016 92     Resp 02/04/21 0016 20     Temp 02/04/21 0016 97.9 F (36.6 C)     Temp Source 02/04/21 0016 Oral     SpO2 02/04/21 0014 100 %     Weight 02/04/21 0016 56.7 kg (125 lb)     Height 02/04/21 0016 1.6  m ('5\' 3"'$ )     Head Circumference --      Peak Flow --      Pain Score 02/04/21 0016 9     Pain Loc --      Pain Edu? --      Excl. in [place]? --     Constitutional: Alert and oriented.  Appears uncomfortable but not in severe distress. Eyes: Conjunctivae are normal.  Head: Atraumatic. Nose: No congestion/rhinnorhea. Mouth/Throat: Patient is wearing a mask. Neck: No stridor.  No meningeal signs.   Cardiovascular: Normal rate, regular rhythm. Good peripheral circulation. Respiratory: Normal respiratory effort.  No retractions. Gastrointestinal: Soft and nontender. No distention.  Musculoskeletal: Tenderness to palpation across multiple ribs in the middle of the left lateral lower part of her rib cage.  No other tenderness is appreciable. Neurologic:  Normal speech and language. No gross focal neurologic deficits are appreciated.  Skin:  Skin is warm, dry and intact. Psychiatric: Mood and affect are normal. Speech and behavior are normal.  ____________________________________________   LABS (all labs ordered are listed, but only abnormal results are displayed)  Labs Reviewed - No data to display ____________________________________________  EKG  No indication for emergent EKG ____________________________________________  RADIOLOGY I, Hinda Kehr, personally viewed and evaluated these images (plain radiographs) as part of my medical decision making, as well as reviewing the written report by the radiologist.  ED MD interpretation: No acute abnormalities identified on rib x-rays  Official radiology report(s): DG Ribs Unilateral W/Chest Left  Result Date: 02/04/2021 CLINICAL DATA:  Fall, left rib pain EXAM: LEFT RIBS AND CHEST - 3+ VIEW COMPARISON:  None. FINDINGS: No fracture or other bone lesions are seen involving the ribs. There is no evidence of pneumothorax or pleural effusion. Both lungs are clear. Heart size and mediastinal contours are within normal limits. Cervical fusion hardware noted. IMPRESSION: Negative. Electronically Signed   By: Fidela Salisbury MD   On: 02/04/2021 01:04    ____________________________________________   PROCEDURES   Procedure(s) performed (including Critical Care):  Procedures   ____________________________________________   INITIAL IMPRESSION / MDM / [place] / ED COURSE  As part of my medical decision making, I reviewed the following data within the [place] History obtained from family, Nursing notes reviewed and incorporated, Old chart reviewed, Radiograph reviewed , Notes from prior ED visits and [place] Controlled Substance Database, transferred ED care to Dr. Cheri Fowler.   Differential diagnosis includes, but is not limited to, rib contusion, rib fracture, pneumothorax or hemothorax.  Patient is generally well-appearing but does appear uncomfortable and has pain as well as tenderness to palpation of the left lateral lower ribs.  This is across multiple ribs and I suspect that she may have some subtle rib fractures.  I personally reviewed the patient's imaging and agree with the radiologist's interpretation that there are no obvious fractures on x-ray, but given her age and the amount of discomfort she is experiencing, I talked with her about a CT chest without  contrast and she agrees that she would like to proceed with the imaging even if the management does not change.  I provided 2 Percocet and we will reassess after the CT scan.     Clinical Course as of 02/04/21 0718  Fri Feb 04, 2021  0718 Patient's blood pressure dropped to the 78I systolic after the 2 Percocet.  Patient is still awake and alert and not hypoxemic although she said that she feels quite sleepy.  CT  scan is still pending.  I am transferring ED care to Dr. Cheri Fowler to follow-up on the imaging and to observe her to make sure that her blood pressure improves. [CF]    Clinical Course User Index [CF] Hinda Kehr, MD     ____________________________________________  FINAL CLINICAL IMPRESSION(S) / ED DIAGNOSES  Final diagnoses:  Rib contusion, left, initial encounter     MEDICATIONS GIVEN DURING THIS VISIT:  Medications  oxyCODONE-acetaminophen (PERCOCET/ROXICET) 5-325 MG per tablet 2 tablet (2 tablets Oral Given 02/04/21 0526)     ED Discharge Orders    None      *Please note:  Kendra [name] was evaluated in Emergency Department on 02/04/2021 for the symptoms described in the history of present illness. She was evaluated in the context of the global COVID-19 pandemic, which necessitated consideration that the patient might be at risk for infection with the SARS-CoV-2 virus that causes COVID-19. Institutional protocols and algorithms that pertain to the evaluation of patients at risk for COVID-19 are in a state of rapid change based on information released by regulatory bodies including the CDC and federal and state organizations. These policies and algorithms were followed during the patient's care in the ED.  Some ED evaluations and interventions may be delayed as a result of limited staffing during and after the pandemic.*  Note:  This document was prepared using Dragon voice recognition software and may include unintentional dictation errors.   Hinda Kehr,  MD 02/04/21 [phone]

## 2021-02-04 NOTE — ED Triage Notes (Signed)
Pt tripped and fell landing on cough hitting left rib area. Pt with pain to left rib, painful on palpation and inspiration. Pt denies any other injury.

## 2021-02-04 NOTE — ED Provider Notes (Signed)
Emergency department signout note  Care was patient was signed to me at the end of the previous value shift.  All pertinent patient information was conveyed and all questions were answered.  Patient was pending CT scan without contrast of the chest concerning for rib fractures.  Results shows left 7-9 rib fractures.  Patient's pain well controlled and feels comfortable going home.  Patient got incentive spirometry teaching and an outpatient prescription for a short course of opiates prior to follow-up with her primary care physician.  The patient has been reexamined and is ready to be discharged.  All diagnostic results have been reviewed and discussed with the patient/family.  Care plan has been outlined and the patient/family understands all current diagnoses, results, and treatment plans.  There are no new complaints, changes, or physical findings at this time.  All questions have been addressed and answered.  All medications, if any, that were given while in the emergency department or any that are being prescribed have been reviewed with the patient/family.  All side effects and adverse reactions have been explained.  Patient was instructed to, and agrees to follow-up with their primary care physician as well as return to the emergency department if any new or worsening symptoms develop.   Naaman Plummer, MD 02/04/21 [phone]

## 2021-02-04 NOTE — ED Notes (Signed)
Patient to CT at this time

## 2021-02-04 NOTE — ED Notes (Signed)
Patient given water

## 2021-02-08 ENCOUNTER — Inpatient Hospital Stay: Payer: Medicare Other

## 2021-02-08 ENCOUNTER — Other Ambulatory Visit: Payer: Self-pay

## 2021-02-08 ENCOUNTER — Encounter: Payer: Self-pay | Admitting: Oncology

## 2021-02-08 ENCOUNTER — Inpatient Hospital Stay (HOSPITAL_BASED_OUTPATIENT_CLINIC_OR_DEPARTMENT_OTHER): Payer: Medicare Other | Admitting: Oncology

## 2021-02-08 VITALS — BP 133/83 | HR 96 | Temp 96.3°F | Resp 18 | Wt 122.9 lb

## 2021-02-08 DIAGNOSIS — E538 Deficiency of other specified B group vitamins: Secondary | ICD-10-CM

## 2021-02-08 DIAGNOSIS — D649 Anemia, unspecified: Secondary | ICD-10-CM

## 2021-02-08 DIAGNOSIS — R803 Bence Jones proteinuria: Secondary | ICD-10-CM

## 2021-02-08 MED ORDER — CYANOCOBALAMIN 1000 MCG/ML IJ SOLN
1000.0000 ug | Freq: Once | INTRAMUSCULAR | Status: AC
Start: 2021-02-08 — End: 2021-02-08
  Administered 2021-02-08: 1000 ug via INTRAMUSCULAR
  Filled 2021-02-08: qty 1

## 2021-02-08 NOTE — Progress Notes (Signed)
Pt here for follow up. No new concerns voiced.   

## 2021-02-08 NOTE — Progress Notes (Signed)
Hematology/Oncology Consult note [facility] [phone] [phone]   Patient Care Team: [name], NP as PCP - General (Internal Medicine) Steele Sizer, MD as Attending Physician (Family Medicine) Christene Lye, MD (General Surgery) [place], Virl Diamond, [place] as Pharmacist Hall Busing Nobie Putnam, RN as Case Manager ([place]) [place], [name], FNP as Nurse Practitioner (Family Medicine) [name], LCSW as Social Worker (Licensed Clinical Social Worker)  REFERRING PROVIDER: [name], FNP  CHIEF COMPLAINTS/REASON FOR VISIT:  Evaluation of anemia  HISTORY OF PRESENTING ILLNESS:  Kendra [name] is a  75 y.o.  female with PMH listed below who was referred to me for evaluation of anemia Reviewed patient's recent labs that was done.  06/14/20 Labs revealed anemia with hemoglobin of 11.2  Reviewed patient's previous labs ordered by primary care physician's office, anemia is chronic onset , duration is since Jan 2021 No aggravating or improving factors.  Associated signs and symptoms: Patient reports fatigue. deneis  SOB with exertion.  Denies  easy bruising, hematochezia, hemoptysis, hematuria. She has had lost weight over the past 2 years. Context: History of GI bleeding: denies               History of Chronic kidney disease: deneis               History of autoimmune disease: Rheumatoid arthritis               Last colonoscopy: 2007                Extensive smoking history   INTERVAL HISTORY Kendra [name] is a 75 y.o. female who has above history reviewed by me today presents for follow up visit for management of anemia Problems and complaints are listed below: Patient had a fall recently and 02/04/2021, CT chest without contrast showed acute nondisplaced fracture of the left anterior ribs 7 through 9, Patient reports left anterior chest wall pain.  Otherwise no new complaints  Review of Systems   Constitutional: Positive for fatigue. Negative for appetite change, chills and fever.  HENT:   Negative for hearing loss and voice change.   Eyes: Negative for eye problems.  Respiratory: Negative for chest tightness, cough and wheezing.   Cardiovascular: Negative for chest pain.  Gastrointestinal: Negative for abdominal distention, abdominal pain and blood in stool.  Endocrine: Negative for hot flashes.  Genitourinary: Negative for difficulty urinating and frequency.   Musculoskeletal: Positive for arthralgias.  Skin: Negative for itching and rash.  Neurological: Negative for extremity weakness.  Hematological: Negative for adenopathy.  Psychiatric/Behavioral: Negative for confusion.     MEDICAL HISTORY:  Past Medical History:  Diagnosis Date  . Asthma   . Back pain   . Cataract   . Cervical disc disorder   . COPD (chronic obstructive pulmonary disease) ([place])   . Depression   . Diabetes ([place])    type II  . Diabetic neuropathy ([place])   . Dyspnea    with exertion  . GERD (gastroesophageal reflux disease)   . History of kidney stones    per patient "can't remember the year"  . Hyperlipidemia   . Hypertension    per patient "take meds for it"  . Insomnia   . Neuropathy   . Osteoarthritis   . Osteopenia   . Pneumonia 2018   per patient  . Reflux   . Rheumatoid arthritis ([place])   . Stroke [facility]) 2015   and again 2017  -  Weakness in left leg, staggers w/ walking, vision   . Tenosynovitis     SURGICAL HISTORY: Past Surgical History:  Procedure Laterality Date  . BREAST BIOPSY Right    Fatty Tumor  . ENDARTERECTOMY Right 10/21/2019   Procedure: ENDARTERECTOMY CAROTID RIGHT;  Surgeon: [name], MD;  Location: [place];  Service: Vascular;  Laterality: Right;  . ENDARTERECTOMY Left 12/30/2019   Procedure: LEFT CAROTID ENDARTERECTOMY;  Surgeon: [name], MD;  Location: [place];  Service: Vascular;  Laterality: Left;  . NECK SURGERY    . OVARIAN  CYST REMOVAL    . PATCH ANGIOPLASTY Right 10/21/2019   Procedure: Patch Angioplasty Using XenoSure Biologic Patch Right Carotid;  Surgeon: [name], MD;  Location: [place];  Service: Vascular;  Laterality: Right;  . PATCH ANGIOPLASTY Left 12/30/2019   Procedure: Patch Angioplasty Left Carotid;  Surgeon: [name], MD;  Location: [place];  Service: Vascular;  Laterality: Left;    SOCIAL HISTORY: Social History   Socioeconomic History  . Marital status: Widowed    Spouse name: Not on file  . Number of children: Not on file  . Years of education: Not on file  . Highest education level: Not on file  Occupational History  . Not on file  Tobacco Use  . Smoking status: Current Some Day Smoker    Packs/day: 0.10    Years: 61.00    Pack years: 6.10    Types: Cigarettes  . Smokeless tobacco: Former Systems developer    Types: Snuff  . Tobacco comment: .5 of 1 cigarette a day  Vaping Use  . Vaping Use: Never used  Substance and Sexual Activity  . Alcohol use: No  . Drug use: No  . Sexual activity: Never  Other Topics Concern  . Not on file  Social History Narrative  . Not on file   Social Determinants of Health   Financial Resource Strain: Low Risk   . Difficulty of Paying Living Expenses: Not very hard  Food Insecurity: No Food Insecurity  . Worried About Charity fundraiser in the Last Year: Never true  . Ran Out of Food in the Last Year: Never true  Transportation Needs: No Transportation Needs  . Lack of Transportation (Medical): No  . Lack of Transportation (Non-Medical): No  Physical Activity: Inactive  . Days of Exercise per Week: 0 days  . Minutes of Exercise per Session: 0 min  Stress: No Stress Concern Present  . Feeling of Stress : Only a little  Social Connections: Socially Isolated  . Frequency of Communication with Friends and Family: More than three times a week  . Frequency of Social Gatherings with Friends and Family: More than three times a  week  . Attends Religious Services: Never  . Active Member of Clubs or Organizations: No  . Attends Archivist Meetings: Never  . Marital Status: Widowed  Intimate Partner Violence: Not At Risk  . Fear of Current or Ex-Partner: No  . Emotionally Abused: No  . Physically Abused: No  . Sexually Abused: No    FAMILY HISTORY: Family History  Problem Relation Age of Onset  . Stroke Father   . Depression Father   . Diabetes Mother   . Hyperlipidemia Mother   . Breast cancer Mother   . Heart murmur Son   . Heart murmur Son   . Heart murmur Son     ALLERGIES:  is allergic to citalopram.  MEDICATIONS:  Current Outpatient  Medications  Medication Sig Dispense Refill  . albuterol (VENTOLIN HFA) 108 (90 Base) MCG/ACT inhaler Inhale 1-2 Puffs every 6 hours as needed Shortness of Breath and wheeze 8.5 g 1  . aspirin 325 MG tablet Take 325 mg by mouth daily.    [name] atorvastatin (LIPITOR) 40 MG tablet TAKE 1 TABLET BY MOUTH BEDTIME 90 tablet 0  . Blood Glucose Monitoring Suppl (ONETOUCH VERIO) w/Device KIT USE AS DIRECTED 1 kit 0  . buPROPion (WELLBUTRIN XL) 150 MG 24 hr tablet Take 1 tablet by mouth every morning.    . cyclobenzaprine (FLEXERIL) 5 MG tablet Take 0.5-1 tablets (2.5-5 mg total) by mouth 3 (three) times daily as needed for muscle spasms. 30 tablet 1  . Fluticasone-Umeclidin-Vilant (TRELEGY ELLIPTA) 100-62.5-25 MCG/INH AEPB USE ONE INHALATION INTO THE LUNGS ONCE A DAY RINSE MOUTH AFTER EACH USE 60 each 1  . glucose blood (ONETOUCH VERIO) test strip Use as instructed 200 each 4  . Lancet Devices (ONE TOUCH DELICA LANCING DEV) MISC 1 Device by Does not apply route 2 (two) times daily. 200 each 4  . OneTouch Delica Lancets 67M MISC 1 Device by Does not apply route 2 (two) times daily. 200 each 4  . oxyCODONE-acetaminophen (PERCOCET) 5-325 MG tablet Take 1 tablet by mouth every 4 (four) hours as needed for severe pain. 20 tablet 0  . traZODone (DESYREL) 50 MG tablet Take  0.5-1 tablets (25-50 mg total) by mouth at bedtime as needed for sleep. 90 tablet 1   No current facility-administered medications for this visit.     PHYSICAL EXAMINATION: ECOG PERFORMANCE STATUS: 1 - Symptomatic but completely ambulatory Vitals:   02/08/21 1317  BP: 133/83  Pulse: 96  Resp: 18  Temp: (!) 96.3 F (35.7 C)   Filed Weights   02/08/21 1317  Weight: 122 lb 14.4 oz (55.7 kg)    Physical Exam Constitutional:      General: She is not in acute distress.    Comments: Thin built  HENT:     Head: Normocephalic and atraumatic.  Eyes:     General: No scleral icterus. Cardiovascular:     Rate and Rhythm: Normal rate and regular rhythm.     Heart sounds: Normal heart sounds.  Pulmonary:     Effort: Pulmonary effort is normal. No respiratory distress.     Breath sounds: No wheezing.     Comments: Left chest wall tenderness Abdominal:     General: Bowel sounds are normal. There is no distension.     Palpations: Abdomen is soft.  Musculoskeletal:        General: No deformity. Normal range of motion.     Cervical back: Normal range of motion and neck supple.  Skin:    General: Skin is warm and dry.     Findings: No erythema or rash.  Neurological:     Mental Status: She is alert. Mental status is at baseline.     Cranial Nerves: No cranial nerve deficit.     Coordination: Coordination normal.  Psychiatric:        Mood and Affect: Mood normal.      LABORATORY DATA:  I have reviewed the data as listed Lab Results  Component Value Date   WBC 4.3 02/01/2021   HGB 11.2 (L) 02/01/2021   HCT 33.8 (L) 02/01/2021   MCV 95.2 02/01/2021   PLT 205 02/01/2021   Recent Labs    06/14/20 0929 07/20/20 1018 12/21/20 1134 02/01/21 1310  NA 141 139  139 137  K 4.1 3.8 4.1 4.1  CL 105 102 104 102  CO2 _0 GLUCOSE 114* 118* 144* 138*  BUN _1 CREATININE 0.83 0.66 0.82 0.82  CALCIUM 9.5 9.5 10.0 9.5  GFRNONAA 70 >60  --  >60  GFRAA 82  --   --    --   PROT 6.6 7.5 6.7 7.0  ALBUMIN  --  4.4  --  3.9  AST _2 ALT _3 ALKPHOS  --  67  --  64  BILITOT 0.5 0.4 0.3 0.5   Iron/TIBC/Ferritin/ %Sat    Component Value Date/Time   IRON 65 07/20/2020 1018   TIBC 395 07/20/2020 1018   FERRITIN 33 07/20/2020 1018   IRONPCTSAT 17 07/20/2020 1018        ASSESSMENT & PLAN:  1. Bence Jones protein present in urine   2. Normocytic anemia   3. B12 deficiency    Bence-Jones proteinuria Likely light chain MGUS or smoldering light chain myeloma. Labs are reviewed and discussed with patient. Patient is has stable chronic anemia of 11.2, normal kidney function. I have ordered 24-hour urine protein electrophoresis and patient forgets to bring her urine sample to clinic today.  She we will try to send her sample to the lab however due to transportation diarrhea, she cannot guarantee.   Vitamin B12 deficiency, B12 level has improved.  Continue vitamin B12 injection monthly   # extensive smoking history: smoking cessation was discussed.  I have referred her to lung cancer screening program.  Orders Placed This Encounter  Procedures  . CBC with Differential/Platelet    Standing Status:   Future    Standing Expiration Date:   02/08/2022  . Comprehensive metabolic panel    Standing Status:   Future    Standing Expiration Date:   02/08/2022  . Vitamin B12    Standing Status:   Future    Standing Expiration Date:   02/08/2022  . 24 hr Ur UPEP/UIFE/Light Chains/TP    Standing Status:   Future    Standing Expiration Date:   02/08/2022    All questions were answered. The patient knows to call the clinic with any problems questions or concerns. Cc. [name], FNP  Return of visit: 4 months  Earlie Server, MD, PhD 02/08/2021

## 2021-02-09 ENCOUNTER — Telehealth: Payer: Self-pay | Admitting: Internal Medicine

## 2021-02-09 ENCOUNTER — Ambulatory Visit: Payer: Medicare Other | Admitting: Pharmacist

## 2021-02-09 DIAGNOSIS — E118 Type 2 diabetes mellitus with unspecified complications: Secondary | ICD-10-CM

## 2021-02-09 DIAGNOSIS — W010XXA Fall on same level from slipping, tripping and stumbling without subsequent striking against object, initial encounter: Secondary | ICD-10-CM

## 2021-02-09 NOTE — Chronic Care Management (AMB) (Signed)
Chronic Care Management Pharmacy Note  02/09/2021 Name:  Kendra [name] MRN:  [id number] DOB:  Sep 26, 1945  Subjective: Kendra [name] is an 75 y.o. year old female who is a primary patient of [name], NP.  The CCM team was consulted for assistance with disease management and care coordination needs.    Engaged with patient by telephone for follow up visit in response to provider referral for pharmacy case management and/or care coordination services.   Consent to Services:  The patient was given information about Chronic Care Management services, agreed to services, and gave verbal consent prior to initiation of services.  Please see initial visit note for detailed documentation.   Patient Care Team: [name], NP as PCP - General (Internal Medicine) Steele Sizer, MD as Attending Physician (Family Medicine) Christene Lye, MD (General Surgery) Shanina Kepple, Virl Diamond, [place] as Pharmacist Vanita Ingles, RN as Case Manager (General Practice) [place], [name], FNP as Nurse Practitioner (Family Medicine) [name], LCSW as Social Worker (Licensed Clinical Social Worker)  Recent office visits: Office Visit with PCP Webb Silversmith, NP on 5/18  Office Visit with [facility] on 5/24 for follow up  Hospital visits: ED Visit on 5/20 for rib fractures after a fall   Objective:  Lab Results  Component Value Date   CREATININE 0.82 02/01/2021   CREATININE 0.82 12/21/2020   CREATININE 0.66 07/20/2020    Lab Results  Component Value Date   HGBA1C 5.8 (A) 12/21/2020   Last diabetic Eye exam: No results found for: HMDIABEYEEXA  Last diabetic Foot exam: No results found for: HMDIABFOOTEX      Component Value Date/Time   CHOL 92 12/21/2020 1134   CHOL 177 06/07/2015 1215   CHOL 141 09/02/2014 0440   TRIG 70 12/21/2020 1134   TRIG 155 09/02/2014 0440   HDL 51 12/21/2020 1134   HDL 56 06/07/2015 1215   HDL 37 (L) 09/02/2014  0440   CHOLHDL 1.8 12/21/2020 1134   VLDL 16 10/17/2019 0606   VLDL 31 09/02/2014 0440   Rachel 26 12/21/2020 1134   [place] 73 09/02/2014 0440    Hepatic Function Latest Ref Rng & Units 02/01/2021 12/21/2020 07/20/2020  Total Protein 6.5 - 8.1 g/dL 7.0 6.7 7.5  Albumin 3.5 - 5.0 g/dL 3.9 - 4.4  AST 15 - 41 U/L $Remo'17 14 20  'AklFz$ ALT 0 - 44 U/L $Remo'18 17 13  'DuZKB$ Alk Phosphatase 38 - 126 U/L 64 - 67  Total Bilirubin 0.3 - 1.2 mg/dL 0.5 0.3 0.4    Clinical ASCVD: Yes  The ASCVD Risk score Mikey Bussing DC Jr., et al., 2013) failed to calculate for the following reasons:   The patient has a prior MI or stroke diagnosis     Social History   Tobacco Use  Smoking Status Current Some Day Smoker  . Packs/day: 0.10  . Years: 61.00  . Pack years: 6.10  . Types: Cigarettes  Smokeless Tobacco Former Systems developer  . Types: Snuff  Tobacco Comment   .5 of 1 cigarette a day   BP Readings from Last 3 Encounters:  02/08/21 133/83  02/04/21 124/72  02/02/21 133/76   Pulse Readings from Last 3 Encounters:  02/08/21 96  02/04/21 81  02/02/21 78   Wt Readings from Last 3 Encounters:  02/08/21 122 lb 14.4 oz (55.7 kg)  02/04/21 125 lb (56.7 kg)  02/02/21 125 lb (56.7 kg)    Assessment: Review of patient past  medical history, allergies, medications, health status, including review of consultants reports, laboratory and other test data, was performed as part of comprehensive evaluation and provision of chronic care management services.   SDOH:  (Social Determinants of Health) assessments and interventions performed: none   CCM Care Plan  Allergies  Allergen Reactions  . Citalopram Nausea And Vomiting    Medications Reviewed Today    Reviewed by Evelina Dun, RN (Registered Nurse) on 02/08/21 at 1312  Med List Status: <None>  Medication Order Taking? Sig Documenting Provider Last Dose Status Informant  albuterol (VENTOLIN HFA) 108 (90 Base) MCG/ACT inhaler [id number]  Inhale 1-2 Puffs every 6 hours as  needed Shortness of Breath and wheeze Olin Hauser, DO  Active   aspirin 325 MG tablet [id number]  Take 325 mg by mouth daily. [provider]  Active   atorvastatin (LIPITOR) 40 MG tablet [id number]  TAKE 1 TABLET BY MOUTH BEDTIME Olin Hauser, DO  Active   Blood Glucose Monitoring Suppl [facility] VERIO) w/Device Drucie Opitz [id number]  USE AS DIRECTED [name] [name], FNP  Active   buPROPion (WELLBUTRIN XL) 150 MG 24 hr tablet [id number]  Take 1 tablet by mouth every morning. [provider]  Active   cyclobenzaprine (FLEXERIL) 5 MG tablet [id number]  Take 0.5-1 tablets (2.5-5 mg total) by mouth 3 (three) times daily as needed for muscle spasms. Olin Hauser, DO  Active   Fluticasone-Umeclidin-Vilant (TRELEGY ELLIPTA) 100-62.5-25 MCG/INH AEPB [id number]  USE ONE INHALATION INTO THE LUNGS ONCE A DAY RINSE MOUTH AFTER EACH USE Olin Hauser, DO  Active   glucose blood (ONETOUCH VERIO) test strip [id number]  Use as instructed Olin Hauser, DO  Active   Lancet Devices (ONE TOUCH DELICA LANCING DEV) MISC [id number]  1 Device by Does not apply route 2 (two) times daily. [name], FNP  Active   OneTouch Delica Lancets 35T MISC [id number]  1 Device by Does not apply route 2 (two) times daily. Karamalegos, Devonne Doughty, DO  Active   oxyCODONE-acetaminophen (PERCOCET) 5-325 MG tablet [id number]  Take 1 tablet by mouth every 4 (four) hours as needed for severe pain. Naaman Plummer, MD  Active   traZODone (DESYREL) 50 MG tablet [id number]  Take 0.5-1 tablets (25-50 mg total) by mouth at bedtime as needed for sleep. Kathrine Haddock, NP  Active           Patient Active Problem List   Diagnosis Date Noted  . Hemiparesis affecting left side as late effect of stroke ([place]) 02/02/2021  . Recurrent major depressive disorder, in partial remission ([place]) 02/02/2021  . Carotid artery stenosis 12/30/2019  . Hypertension associated with diabetes ([place])  12/19/2018  . Diabetes mellitus type 2, controlled, with complications ([place]) 56/25/6389  . Insomnia 03/30/2015  . Hyperlipidemia associated with type 2 diabetes mellitus ([place]) 03/30/2015  . Gastro-esophageal reflux disease without esophagitis 03/30/2015  . History of cerebrovascular accident with residual deficit 03/30/2015  . Osteoarthritis 03/30/2015  . Osteopenia 03/30/2015  . B12 deficiency 03/30/2015    Immunization History  Administered Date(s) Administered  . Fluad Quad(high Dose 65+) 05/21/2019  . Influenza Split 06/20/2012  . Influenza, High Dose Seasonal PF 07/26/2016, 06/14/2017, 07/31/2018  . Influenza, Seasonal, Injecte, Preservative Fre 08/04/2010  . Influenza,inj,Quad PF,6+ Mos 06/10/2013, 05/26/2014, 06/07/2015  . Influenza-Unspecified 05/26/2014, 06/18/2018  . PFIZER(Purple Top)SARS-COV-2 Vaccination 03/02/2020, 03/30/2020  . Pneumococcal Conjugate-13 10/31/2007, 10/21/2014  . Pneumococcal Polysaccharide-23 10/31/2007, 06/20/2012, 07/31/2018  . Tdap 08/04/2010  . Zoster 06/10/2013  Conditions to be addressed/monitored: HLD, COPD and DMII, history of CVA  Care Plan : PharmD - Medication Mgmt  Updates made by Vella Raring, RPH-CPP since 02/09/2021 12:00 AM    Problem: Disease Progression     Long-Range Goal: Disease Progression Prevented or Minimized   Start Date: 11/01/2020  Expected End Date: 01/30/2021  Recent Progress: On track  Priority: High  Note:   Current Barriers:  . Financial Barriers . Limited social support  Pharmacist Clinical Goal(s):  [name] Over the next 90 days, patient will maintain adherence to monitoring guidelines and medication adherence to achieve therapeutic efficacy through collaboration with PharmD and provider.   Interventions: . 1:1 collaboration with Olin Hauser, DO regarding development and update of comprehensive plan of care as evidenced by provider attestation and co-signature . Inter-disciplinary care  team collaboration (see longitudinal plan of care) . Perform chart review. [facility] ED on [facility]/20 for rib fractures after a fall . Today patient reports that she is laying down when we speak. Unable to complete comprehensive medication review today. . Regarding recent fall, states "I get off balance sometimes" o Reports has a walker and rollator, but denies typically using these. Reports walker needs to be tightened/fixed o Encourage patient to use rollator when walking o Denies taking cyclobenzaprine.  o Reports has been taking oxycodone as needed as presribed from ED provider. Encourage patient to sit or lie down after taking to avoid fall. . Advise patient to contact PCP office to schedule post-ED follow up visit and to health plan to schedule transportation o Patient confirms will call to schedule today . Reports interested in getting more Glucerna. Encourage patient to use health plan benefit card for Glucerna. . Place referral to Care Guide team for assistance to patient with OTC benefit card and to help determine if patient might qualify for any community resources for a new walker . Collaborate with CCM Social Worker today, who has been helping patient determine if qualifies for [employer] and regarding options for obtaining a lift chair . Will plan to complete medication review and follow up regarding home monitoring (blood sugar and blood pressure) during next phone call.  Patient Goals/Self-Care Activities . Over the next 90 days, patient will:  - take medications as prescribed  Using pill packaging from [facility] - check glucose, document, and provide at future appointments - check blood pressure, document, and provide at future appointments  Follow Up Plan: Telephone follow up appointment with care management team member scheduled for: 7/14 at 9 am      Patient's preferred pharmacy is:  [place], [place]. [place] [address] Phone: [phone] Fax: [phone]  [id number] [place], [place] [address] [place] [address] Phone: [phone] Fax: [phone]  [facility] [address] [place], [place] [address] Phone: [phone] Fax: [phone]  Uses pill box? Pill pack   Follow Up:  Patient agrees to Care Plan and Follow-up.  [name], PharmD, Para [place], CPP Clinical Pharmacist [facility] [phone]

## 2021-02-09 NOTE — Telephone Encounter (Signed)
[employer] tech with [facility] [place] [address] in [place] phone number [phone] is calling and would like a refill on plavix last prescribed by nicole nov 2021 . Pt got 90 tablet

## 2021-02-09 NOTE — Patient Instructions (Signed)
Visit Information  PATIENT GOALS: Goals Addressed            This Visit's Progress   . Pharmacy - Patient Goals       -Please continue to keep log of home blood sugar results and have for [place] to review during our calls -Please continue to keep log of blood pressure results and have for [place] to review during our calls  Our goal bad cholesterol, or LDL, is less than 70 . This is why it is important to continue taking your atorvastatin.  I look forward to talking to you during our next telephone appointment. Please call if you need something sooner!    [name], PharmD, [place] [phone]        The patient verbalized understanding of instructions, educational materials, and care plan provided today and declined offer to receive copy of patient instructions, educational materials, and care plan.   Telephone follow up appointment with care management team member scheduled for: 7/14 at 9 am

## 2021-02-09 NOTE — Telephone Encounter (Signed)
I received a phone call from [name] from [place] requesting a refill on the patient Plavix. I informed her that from our record the patient is no longer on Plavix. She informed me that they have been filling this prescription and that it was put in her last bubble wrap. Please advise if the patient should continue on this medication.

## 2021-02-09 NOTE — Telephone Encounter (Signed)
The patient advised the CMA at the appointment that she was not taking Plavix, so it was d/c'd. Given history of carotid stenosis, she would benefit from Plavix. Ok to refill.

## 2021-02-10 NOTE — Telephone Encounter (Signed)
The pt was seen at the [facility] x 6 days ago for Closed fracture of multiple ribs on the left side. She called requesting a refill on oxycodone.

## 2021-02-11 ENCOUNTER — Telehealth: Payer: Self-pay | Admitting: Internal Medicine

## 2021-02-11 NOTE — Telephone Encounter (Signed)
Long term narcotics for rib fractures is not indicated. She needs to schedule an ER follow up. Recommend Tylenol 1000 mg 3 x day.

## 2021-02-11 NOTE — Telephone Encounter (Signed)
The pt was notified of Rollene Fare recommendation. She verbalize understanding, no questions or concerns.

## 2021-02-11 NOTE — Telephone Encounter (Signed)
   Telephone encounter was:  Unsuccessful.  02/11/2021 Name: Kendra [name] MRN: [id number] DOB: 06/07/1946  Unsuccessful outbound call made today to assist with:  Financial Difficulties related to medical equipment and RX assistance  Outreach Attempt:  1st Attempt  A HIPAA compliant voice message was left requesting a return call.  Instructed patient to call back at [phone].  [place], Care Management Phone: [phone] Email: [email or web address]

## 2021-02-15 ENCOUNTER — Ambulatory Visit: Payer: Medicare Other | Admitting: Licensed Clinical Social Worker

## 2021-02-15 DIAGNOSIS — Z794 Long term (current) use of insulin: Secondary | ICD-10-CM

## 2021-02-15 DIAGNOSIS — I152 Hypertension secondary to endocrine disorders: Secondary | ICD-10-CM

## 2021-02-15 DIAGNOSIS — F324 Major depressive disorder, single episode, in partial remission: Secondary | ICD-10-CM | POA: Diagnosis not present

## 2021-02-15 DIAGNOSIS — E1159 Type 2 diabetes mellitus with other circulatory complications: Secondary | ICD-10-CM

## 2021-02-15 DIAGNOSIS — E118 Type 2 diabetes mellitus with unspecified complications: Secondary | ICD-10-CM

## 2021-02-15 DIAGNOSIS — F3341 Major depressive disorder, recurrent, in partial remission: Secondary | ICD-10-CM

## 2021-02-15 DIAGNOSIS — E785 Hyperlipidemia, unspecified: Secondary | ICD-10-CM

## 2021-02-15 DIAGNOSIS — Z7189 Other specified counseling: Secondary | ICD-10-CM

## 2021-02-15 DIAGNOSIS — I10 Essential (primary) hypertension: Secondary | ICD-10-CM

## 2021-02-15 DIAGNOSIS — E1142 Type 2 diabetes mellitus with diabetic polyneuropathy: Secondary | ICD-10-CM

## 2021-02-15 DIAGNOSIS — E1169 Type 2 diabetes mellitus with other specified complication: Secondary | ICD-10-CM

## 2021-02-16 NOTE — Patient Instructions (Signed)
Visit Information  Goals Addressed              This Visit's Progress     Patient Stated   .  "I probably could benefit from more support." (pt-stated)   On track     Patient Self Care Activities:  . Attend all scheduled provider appointments. Patient agreed to discuss additional strategies to promote safety and reduce falls due to patient's inability to afford lift . Call provider office for new concerns or questions . Utilize healthy coping skills discussed and continue to comply with med management    .  SW-I probably could use some extra support right now. (pt-stated)   On track     Patient Self Care Activities:  . Attend all scheduled provider appointments. Patient agreed to discuss additional strategies to promote safety and reduce falls due to patient's inability to afford lift . Call provider office for new concerns or questions . Utilize healthy coping skills discussed and continue to comply with med management       Patient verbalizes understanding of instructions provided today.   Telephone follow up appointment with care management team member scheduled for:03/02/21  Christa See, MSW, [email or web address] Phone [phone] 11:24 AM

## 2021-02-16 NOTE — Chronic Care Management (AMB) (Signed)
Chronic Care Management    Clinical Social Work Note  02/16/2021 Name: Kendra [name] MRN: [id number] DOB: 04-29-1946  Kendra [name] is a 75 y.o. year old female who is a primary care patient of Kendra Fenton, NP. The CCM team was consulted to assist the patient with chronic disease management and/or care coordination needs related to: Transportation Needs , Level of Care Concerns and Mental Health Counseling and Resources.   Engaged with patient by telephone for follow up visit in response to provider referral for social work chronic care management and care coordination services.   Consent to Services:  The patient was given information about Chronic Care Management services, agreed to services, and gave verbal consent prior to initiation of services.  Please see initial visit note for detailed documentation.   Patient agreed to services and consent obtained.   Assessment: Patient is making progress with management of depression symptoms , but continues to have difficulty with transportation barriers. CCM LCSW was successful in enrolling patient with Kendra [name], who will assist patient with upcoming appointment at no cost. Patient recently sustained an injury from a fall on 05/20 and has a follow up appt with PCP to address safety promotion and strategies to decrease fall risk. Patient is not able to afford out of pocket costs associated with a lift. See Care Plan below for interventions and patient self-care actives. Recent life changes /stressors: Transportation and financial strain Recommendation: Patient may benefit from, and is in agreement to compliance with care plan goals.  Follow up Plan: Patient would like continued follow-up.  CCM LCSW will follow up with patient on 03/02/21. Patient will call office if needed prior to next encounter.  SDOH (Social Determinants of Health) assessments and interventions performed:    Advanced Directives Status: Not addressed in this  encounter.  CCM Care Plan  Allergies  Allergen Reactions  . Citalopram Nausea And Vomiting    Outpatient Encounter Medications as of 02/15/2021  Medication Sig  . albuterol (VENTOLIN HFA) 108 (90 Base) MCG/ACT inhaler Inhale 1-2 Puffs every 6 hours as needed Shortness of Breath and wheeze  . aspirin 325 MG tablet Take 325 mg by mouth daily.  [name] atorvastatin (LIPITOR) 40 MG tablet TAKE 1 TABLET BY MOUTH BEDTIME  . Blood Glucose Monitoring Suppl (ONETOUCH VERIO) w/Device KIT USE AS DIRECTED  . buPROPion (WELLBUTRIN XL) 150 MG 24 hr tablet Take 1 tablet by mouth every morning.  . cyclobenzaprine (FLEXERIL) 5 MG tablet Take 0.5-1 tablets (2.5-5 mg total) by mouth 3 (three) times daily as needed for muscle spasms.  . Fluticasone-Umeclidin-Vilant (TRELEGY ELLIPTA) 100-62.5-25 MCG/INH AEPB USE ONE INHALATION INTO THE LUNGS ONCE A DAY RINSE MOUTH AFTER EACH USE  . glucose blood (ONETOUCH VERIO) test strip Use as instructed  . Lancet Devices (ONE TOUCH DELICA LANCING DEV) MISC 1 Device by Does not apply route 2 (two) times daily.  Kendra [name] Delica Lancets 63W MISC 1 Device by Does not apply route 2 (two) times daily.  [name] oxyCODONE-acetaminophen (PERCOCET) 5-325 MG tablet Take 1 tablet by mouth every 4 (four) hours as needed for severe pain.  . traZODone (DESYREL) 50 MG tablet Take 0.5-1 tablets (25-50 mg total) by mouth at bedtime as needed for sleep.   No facility-administered encounter medications on file as of 02/15/2021.    Patient Active Problem List   Diagnosis Date Noted  . Hemiparesis affecting left side as late effect of stroke ([place]) 02/02/2021  . Recurrent major depressive disorder, in partial  remission ([place]) 02/02/2021  . Carotid artery stenosis 12/30/2019  . Hypertension associated with diabetes ([place]) 12/19/2018  . Diabetes mellitus type 2, controlled, with complications ([place]) 01/75/1025  . Insomnia 03/30/2015  . Hyperlipidemia associated with type 2 diabetes mellitus ([place])  03/30/2015  . Gastro-esophageal reflux disease without esophagitis 03/30/2015  . History of cerebrovascular accident with residual deficit 03/30/2015  . Osteoarthritis 03/30/2015  . Osteopenia 03/30/2015  . B12 deficiency 03/30/2015    Conditions to be addressed/monitored: Depression; Transportation, Level of care concerns and Mental Health Concerns   Care Plan : General Social Work (Adult)  Updates made by [name], LCSW since 02/16/2021 12:00 AM    Problem: Coping Skills (General Plan of Care)     Long-Range Goal: Coping Skills Enhanced   Start Date: 12/14/2020  Recent Progress: On track  Priority: Medium  Note:   Timeframe:  Long-Range Goal Priority:  Medium Start Date:   12/14/20                    Expected End Date: 05/18/21                Follow Up Date -03/02/21  Current Barriers:  . Financial constraints . Limited social support . ADL IADL limitations . Mental Health Concerns  . Social Isolation . Limited access to caregiver . Inability to perform ADL's independently . Inability to perform IADL's independently . Lacks knowledge of community resource: grief support resources within the area  Clinical Social Work Clinical Goal(s):  [name] Over the next 120 days, patient will work with SW to address concerns related to gaining additional support within the home and resource connection in order to maintain health and independency within the community  . Over the next 120 days, patient will demonstrate improved adherence to self care as evidenced by implementing healthy self-care into her daily routine such as: attending all medical appointments, taking time for self-reflection, taking medications as prescribed, drinking water and daily exercise to improve mobility.  Interventions: . CCM LCSW collaborated with CCM Pharmacist, who informed LCSW of patient's recent ED visit after a fall that resulted in injury on 02/04/21. Per chart review, patient continues to miss scheduled  follow up appointments due to transportation barriers. Patient utilizes a cane and walker; however, endorsed that it is unsteady and is not effective in fall prevention . Patient interviewed and appropriate assessments performed . Patient reports that she completed an appointment with PCP on 05/18; however, she has missed other follow up appointments due to ongoing transportation barriers. Patient continues to sustain injuries from falling which patient identified increases symptoms of depression symptoms  . Patient continues to reside with daughter in law and reports that family will check in with her . CCM LCSW provided patient with update on the cost of lift. CCM LCSW spoke with a representative at [employer] [phone] located at [address] [place] [address]. The cost of the lift was estimated at $3000 noting Medicare would cover approximately $300. Patient would then have to pay the remaining balance prior to obtaining lift (there is no payment plan option) Patient shared that she could not afford the lift due to fixed income. CCM LCSW strongly encouraged patient to speak with PCP about other options to promote safety and decrease risks of falls at upcoming appointment, scheduled for 02/17/21 . CCM LCSW collaborated with [employer] about transportation options for patient. No options were available within  clinic and LCSW was encouraged to follow up with leadership regarding options . CCM LCSW collaborated with CCM Water engineer, Casimer Lanius, who provided [employer]. CCM LCSW made contact and was successful at re-newing patient's referral and scheduling transportation for upcoming PCP appt.  . Patient reports compliance with medication management to assist with management of anxiety/depression . Discussed plans with patient for ongoing care management follow up and provided patient with direct contact  information for care management team . Evaluated patient's stress level. Emotional Support provided due to ongoing health complications.  [name] 1:1 collaboration with PCP regarding development and update of comprehensive plan of care as evidenced by provider attestation and co-signature  . Discussed plans with patient for ongoing care management follow up and provided patient with direct contact information for care management team . Advised patient to contact CCM providers if she has any concerns in regards to her safety, housing or well-being. Patient reports being currently stable.   Patient Self Care Activities:  . Attend all scheduled provider appointments. Patient agreed to discuss additional strategies to promote safety and reduce falls due to patient's inability to afford lift . Call provider office for new concerns or questions . Utilize healthy coping skills discussed and continue to comply with med management         Christa See, MSW, [email or web address] Phone [phone] 11:23 AM

## 2021-02-17 ENCOUNTER — Ambulatory Visit: Payer: Medicare Other | Admitting: Internal Medicine

## 2021-02-17 NOTE — Progress Notes (Deleted)
Subjective:    Patient ID: Kendra [name], female    DOB: [birth year], 75 y.o.   MRN: [id number]  HPI  Pt presents to the clinic today for ER follow up. She went to the ER 5/20 s/p fall with c/o left side rib pain. Xray ribs did not show any acute fractures. CT chest did show left anterior 7-9th rib fractures. She was given Percocet which dropped her blood pressure significantly. She was discharged with a RX for Percocet and advised to follow up with her PCP. Since discharge.  Review of Systems  Past Medical History:  Diagnosis Date  . Asthma   . Back pain   . Cataract   . Cervical disc disorder   . COPD (chronic obstructive pulmonary disease) (HCC)   . Depression   . Diabetes (HCC)    type II  . Diabetic neuropathy (HCC)   . Dyspnea    with exertion  . GERD (gastroesophageal reflux disease)   . History of kidney stones    per patient "can't remember the year"  . Hyperlipidemia   . Hypertension    per patient "take meds for it"  . Insomnia   . Neuropathy   . Osteoarthritis   . Osteopenia   . Pneumonia 2018   per patient  . Reflux   . Rheumatoid arthritis (HCC)   . Stroke (HCC) 2015   and again 2017  - Weakness in left leg, staggers w/ walking, vision   . Tenosynovitis     Current Outpatient Medications  Medication Sig Dispense Refill  . albuterol (VENTOLIN HFA) 108 (90 Base) MCG/ACT inhaler Inhale 1-2 Puffs every 6 hours as needed Shortness of Breath and wheeze 8.5 g 1  . aspirin 325 MG tablet Take 325 mg by mouth daily.    [name] atorvastatin (LIPITOR) 40 MG tablet TAKE 1 TABLET BY MOUTH BEDTIME 90 tablet 0  . Blood Glucose Monitoring Suppl (ONETOUCH VERIO) w/Device KIT USE AS DIRECTED 1 kit 0  . buPROPion (WELLBUTRIN XL) 150 MG 24 hr tablet Take 1 tablet by mouth every morning.    . cyclobenzaprine (FLEXERIL) 5 MG tablet Take 0.5-1 tablets (2.5-5 mg total) by mouth 3 (three) times daily as needed for muscle spasms. 30 tablet 1  . Fluticasone-Umeclidin-Vilant (TRELEGY  ELLIPTA) 100-62.5-25 MCG/INH AEPB USE ONE INHALATION INTO THE LUNGS ONCE A DAY RINSE MOUTH AFTER EACH USE 60 each 1  . glucose blood (ONETOUCH VERIO) test strip Use as instructed 200 each 4  . Lancet Devices (ONE TOUCH DELICA LANCING DEV) MISC 1 Device by Does not apply route 2 (two) times daily. 200 each 4  . OneTouch Delica Lancets 33G MISC 1 Device by Does not apply route 2 (two) times daily. 200 each 4  . oxyCODONE-acetaminophen (PERCOCET) 5-325 MG tablet Take 1 tablet by mouth every 4 (four) hours as needed for severe pain. 20 tablet 0  . traZODone (DESYREL) 50 MG tablet Take 0.5-1 tablets (25-50 mg total) by mouth at bedtime as needed for sleep. 90 tablet 1   No current facility-administered medications for this visit.    Allergies  Allergen Reactions  . Citalopram Nausea And Vomiting    Family History  Problem Relation Age of Onset  . Stroke Father   . Depression Father   . Diabetes Mother   . Hyperlipidemia Mother   . Breast cancer Mother   . Heart murmur Son   . Heart murmur Son   . Heart murmur Son     Social  History   Socioeconomic History  . Marital status: Widowed    Spouse name: Not on file  . Number of children: Not on file  . Years of education: Not on file  . Highest education level: Not on file  Occupational History  . Not on file  Tobacco Use  . Smoking status: Current Some Day Smoker    Packs/day: 0.10    Years: 61.00    Pack years: 6.10    Types: Cigarettes  . Smokeless tobacco: Former Systems developer    Types: Snuff  . Tobacco comment: .5 of 1 cigarette a day  Vaping Use  . Vaping Use: Never used  Substance and Sexual Activity  . Alcohol use: No  . Drug use: No  . Sexual activity: Never  Other Topics Concern  . Not on file  Social History Narrative  . Not on file   Social Determinants of Health   Financial Resource Strain: Low Risk   . Difficulty of Paying Living Expenses: Not very hard  Food Insecurity: No Food Insecurity  . Worried About  Charity fundraiser in the Last Year: Never true  . Ran Out of Food in the Last Year: Never true  Transportation Needs: No Transportation Needs  . Lack of Transportation (Medical): No  . Lack of Transportation (Non-Medical): No  Physical Activity: Inactive  . Days of Exercise per Week: 0 days  . Minutes of Exercise per Session: 0 min  Stress: No Stress Concern Present  . Feeling of Stress : Only a little  Social Connections: Socially Isolated  . Frequency of Communication with Friends and Family: More than three times a week  . Frequency of Social Gatherings with Friends and Family: More than three times a week  . Attends Religious Services: Never  . Active Member of Clubs or Organizations: No  . Attends Archivist Meetings: Never  . Marital Status: Widowed  Intimate Partner Violence: Not At Risk  . Fear of Current or Ex-Partner: No  . Emotionally Abused: No  . Physically Abused: No  . Sexually Abused: No     Constitutional: Denies fever, malaise, fatigue, headache or abrupt weight changes.  HEENT: Denies eye pain, eye redness, ear pain, ringing in the ears, wax buildup, runny nose, nasal congestion, bloody nose, or sore throat. Respiratory: Denies difficulty breathing, shortness of breath, cough or sputum production.   Cardiovascular: Denies chest pain, chest tightness, palpitations or swelling in the hands or feet.  Gastrointestinal: Denies abdominal pain, bloating, constipation, diarrhea or blood in the stool.  GU: Denies urgency, frequency, pain with urination, burning sensation, blood in urine, odor or discharge. Musculoskeletal: Pt reports left side rib pain. Denies decrease in range of motion, difficulty with gait, muscle pain or joint swelling.  Skin: Denies redness, rashes, lesions or ulcercations.  Neurological: Denies dizziness, difficulty with memory, difficulty with speech or problems with balance and coordination.  Psych: Denies anxiety, depression,  SI/HI.  No other specific complaints in a complete review of systems (except as listed in HPI above).     Objective:   Physical Exam   There were no vitals taken for this visit. Wt Readings from Last 3 Encounters:  02/08/21 122 lb 14.4 oz (55.7 kg)  02/04/21 125 lb (56.7 kg)  02/02/21 125 lb (56.7 kg)    General: Appears their stated age, well developed, well nourished in NAD. Skin: Warm, dry and intact. No rashes, lesions or ulcerations noted. HEENT: Head: normal shape and size; Eyes: sclera white,  no icterus, conjunctiva pink, PERRLA and EOMs intact; Ears: Tm's gray and intact, normal light reflex; Nose: mucosa pink and moist, septum midline; Throat/Mouth: Teeth present, mucosa pink and moist, no exudate, lesions or ulcerations noted.  Neck:  Neck supple, trachea midline. No masses, lumps or thyromegaly present.  Cardiovascular: Normal rate and rhythm. S1,S2 noted.  No murmur, rubs or gallops noted. No JVD or BLE edema. No carotid bruits noted. Pulmonary/Chest: Normal effort and positive vesicular breath sounds. No respiratory distress. No wheezes, rales or ronchi noted.  Abdomen: Soft and nontender. Normal bowel sounds. No distention or masses noted. Liver, spleen and kidneys non palpable. Musculoskeletal: Normal range of motion. No signs of joint swelling. No difficulty with gait.  Neurological: Alert and oriented. Cranial nerves II-XII grossly intact. Coordination normal.  Psychiatric: Mood and affect normal. Behavior is normal. Judgment and thought content normal.   EKG:  BMET    Component Value Date/Time   NA 137 02/01/2021 1310   NA 141 01/18/2016 1150   NA 138 09/01/2014 2130   K 4.1 02/01/2021 1310   K 3.5 09/01/2014 2130   CL 102 02/01/2021 1310   CL 104 09/01/2014 2130   CO2 27 02/01/2021 1310   CO2 28 09/01/2014 2130   GLUCOSE 138 (H) 02/01/2021 1310   GLUCOSE 287 (H) 09/01/2014 2130   BUN 13 02/01/2021 1310   BUN 12 01/18/2016 1150   BUN 14 09/01/2014 2130    CREATININE 0.82 02/01/2021 1310   CREATININE 0.82 12/21/2020 1134   CALCIUM 9.5 02/01/2021 1310   CALCIUM 8.9 09/01/2014 2130   GFRNONAA >60 02/01/2021 1310   GFRNONAA 70 06/14/2020 0929   GFRAA 82 06/14/2020 0929    Lipid Panel     Component Value Date/Time   CHOL 92 12/21/2020 1134   CHOL 177 06/07/2015 1215   CHOL 141 09/02/2014 0440   TRIG 70 12/21/2020 1134   TRIG 155 09/02/2014 0440   HDL 51 12/21/2020 1134   HDL 56 06/07/2015 1215   HDL 37 (L) 09/02/2014 0440   CHOLHDL 1.8 12/21/2020 1134   VLDL 16 10/17/2019 0606   VLDL 31 09/02/2014 0440   LDLCALC 26 12/21/2020 1134   LDLCALC 73 09/02/2014 0440    CBC    Component Value Date/Time   WBC 4.3 02/01/2021 1310   RBC 3.55 (L) 02/01/2021 1310   HGB 11.2 (L) 02/01/2021 1310   HGB 12.7 09/01/2014 2130   HCT 33.8 (L) 02/01/2021 1310   HCT 37.9 09/01/2014 2130   PLT 205 02/01/2021 1310   PLT 260 09/05/2014 0517   MCV 95.2 02/01/2021 1310   MCV 92 09/01/2014 2130   MCH 31.5 02/01/2021 1310   MCHC 33.1 02/01/2021 1310   RDW 13.8 02/01/2021 1310   RDW 13.9 09/01/2014 2130   LYMPHSABS 1.5 02/01/2021 1310   MONOABS 0.2 02/01/2021 1310   EOSABS 0.2 02/01/2021 1310   BASOSABS 0.0 02/01/2021 1310    Hgb A1C Lab Results  Component Value Date   HGBA1C 5.8 (A) 12/21/2020           Assessment & Plan:    ER Follow up for Left Side Rib Fractures s/p Fall:  ER notes and imaging reviewed   Return precautions discussed Webb Silversmith, NP This visit occurred during the SARS-CoV-2 public health emergency.  Safety protocols were in place, including screening questions prior to the visit, additional usage of staff PPE, and extensive cleaning of exam room while observing appropriate contact time as indicated for disinfecting  solutions.

## 2021-02-21 ENCOUNTER — Ambulatory Visit: Payer: Medicare Other | Admitting: Podiatry

## 2021-02-22 ENCOUNTER — Encounter: Payer: Self-pay | Admitting: Internal Medicine

## 2021-02-22 ENCOUNTER — Other Ambulatory Visit: Payer: Self-pay

## 2021-02-22 ENCOUNTER — Ambulatory Visit (INDEPENDENT_AMBULATORY_CARE_PROVIDER_SITE_OTHER): Payer: Medicare Other | Admitting: Internal Medicine

## 2021-02-22 VITALS — BP 129/73 | HR 81 | Temp 97.5°F | Resp 18 | Ht 63.0 in | Wt 124.6 lb

## 2021-02-22 DIAGNOSIS — S2242XA Multiple fractures of ribs, left side, initial encounter for closed fracture: Secondary | ICD-10-CM | POA: Diagnosis not present

## 2021-02-22 DIAGNOSIS — W010XXD Fall on same level from slipping, tripping and stumbling without subsequent striking against object, subsequent encounter: Secondary | ICD-10-CM | POA: Diagnosis not present

## 2021-02-22 MED ORDER — TRAMADOL HCL 50 MG PO TABS: 50.0000 mg | ORAL_TABLET | Freq: Three times a day (TID) | ORAL | 0 refills | Status: AC | PRN

## 2021-02-22 NOTE — Patient Instructions (Signed)
Rib Fracture  A rib fracture is a break or crack in one of the bones of the ribs. The ribs are like a cage that goes around your upper chest. A broken or cracked rib is often painful, but most do not cause other problems. Most rib fractures usually heal on their own in 1-3 months. What are the causes?  Doing movements over and over again with a lot of force, such as pitching a baseball or having a very bad cough.  A direct hit to the chest.  Cancer that has spread to the bones. What are the signs or symptoms?  Pain when you breathe in or cough.  Pain when someone presses on the injured area.  Feeling short of breath. How is this treated? Treatment depends on how bad the fracture is. In general:  Most rib fractures usually heal on their own in 1-3 months.  Healing may take longer if you have a cough or are doing activities that make the injury worse.  While you heal, you may be given medicines to control pain.  You will also be taught deep breathing exercises.  Very bad injuries may require a stay at the hospital or surgery. Follow these instructions at home: Managing pain, stiffness, and swelling  If told, put ice on the injured area. To do this: ? Put ice in a plastic bag. ? Place a towel between your skin and the bag. ? Leave the ice on for 20 minutes, 2-3 times a day. ? Take off the ice if your skin turns bright red. This is very important. If you cannot feel pain, heat, or cold, you have a greater risk of damage to the area.  Take over-the-counter and prescription medicines only as told by your doctor. Activity  Avoid activities that cause pain to the injured area. Protect your injured area.  Slowly increase activity as told by your doctor. General instructions  Do deep breathing exercises as told by your doctor. You may be told to: ? Take deep breaths many times a day. ? Cough several times a day while hugging a pillow. ? Use a device (incentive spirometer) to do  deep breathing many times a day.  Drink enough fluid to keep your pee (urine) clear or pale yellow.  Do not wear a rib belt or binder.  Keep all follow-up visits. Contact a doctor if:  You have a fever. Get help right away if:  You have trouble breathing.  You are short of breath.  You cannot stop coughing.  You cough up thick or bloody spit.  You feel like you may vomit (nauseous), vomit, or have belly (abdominal) pain.  Your pain gets worse and medicine does not help. These symptoms may be an emergency. Get help right away. Call your local emergency services (911 in the [place]  Do not wait to see if the symptoms will go away.  Do not drive yourself to the hospital. Summary  A rib fracture is a break or crack in one of the bones of the ribs.  Apply ice to the injured area and take medicines for pain as told by your doctor.  Take deep breaths and cough several times a day. Hug a pillow every time you cough. This information is not intended to replace advice given to you by your health care provider. Make sure you discuss any questions you have with your health care provider. Document Revised: 12/26/2019 Document Reviewed: 12/26/2019 Elsevier Patient Education  2021 [employer].

## 2021-02-22 NOTE — Progress Notes (Signed)
Subjective:    Patient ID: Kendra [name], female    DOB: 1946-07-25, 75 y.o.   MRN: [id number]  HPI  Pt presents to the clinic today for ER follow up. She went to the ER 5/20 after a fall. She had tripped and landed on the couch. She complained of left side rib pain. Xray did not show any rib fractures but CT chest showed left 7-9th rib fractures. She was instructed on the use of incentive spirometry and given RX for Percocet for pain. She was discharged and advised to follow up with her PCP. Since discharge, she reports persistent pain in the left side of her ribs. She is taking deep breaths and coughing but reports it is painful. She has been taking Tylenol arthritis OTC with minimal relief of symptoms.   Review of Systems      Past Medical History:  Diagnosis Date  . Asthma   . Back pain   . Cataract   . Cervical disc disorder   . COPD (chronic obstructive pulmonary disease) ([place])   . Depression   . Diabetes ([place])    type II  . Diabetic neuropathy ([place])   . Dyspnea    with exertion  . GERD (gastroesophageal reflux disease)   . History of kidney stones    per patient "can't remember the year"  . Hyperlipidemia   . Hypertension    per patient "take meds for it"  . Insomnia   . Neuropathy   . Osteoarthritis   . Osteopenia   . Pneumonia 2018   per patient  . Reflux   . Rheumatoid arthritis ([place])   . Stroke ([place]) 2015   and again 2017  - Weakness in left leg, staggers w/ walking, vision   . Tenosynovitis     Current Outpatient Medications  Medication Sig Dispense Refill  . albuterol (VENTOLIN HFA) 108 (90 Base) MCG/ACT inhaler Inhale 1-2 Puffs every 6 hours as needed Shortness of Breath and wheeze 8.5 g 1  . aspirin 325 MG tablet Take 325 mg by mouth daily.    [name] atorvastatin (LIPITOR) 40 MG tablet TAKE 1 TABLET BY MOUTH BEDTIME 90 tablet 0  . Blood Glucose Monitoring Suppl (ONETOUCH VERIO) w/Device KIT USE AS DIRECTED 1 kit 0  . buPROPion (WELLBUTRIN XL) 150 MG 24  hr tablet Take 1 tablet by mouth every morning.    . cyclobenzaprine (FLEXERIL) 5 MG tablet Take 0.5-1 tablets (2.5-5 mg total) by mouth 3 (three) times daily as needed for muscle spasms. 30 tablet 1  . Fluticasone-Umeclidin-Vilant (TRELEGY ELLIPTA) 100-62.5-25 MCG/INH AEPB USE ONE INHALATION INTO THE LUNGS ONCE A DAY RINSE MOUTH AFTER EACH USE 60 each 1  . glucose blood (ONETOUCH VERIO) test strip Use as instructed 200 each 4  . Lancet Devices (ONE TOUCH DELICA LANCING DEV) MISC 1 Device by Does not apply route 2 (two) times daily. 200 each 4  . OneTouch Delica Lancets 50N MISC 1 Device by Does not apply route 2 (two) times daily. 200 each 4  . oxyCODONE-acetaminophen (PERCOCET) 5-325 MG tablet Take 1 tablet by mouth every 4 (four) hours as needed for severe pain. 20 tablet 0  . traZODone (DESYREL) 50 MG tablet Take 0.5-1 tablets (25-50 mg total) by mouth at bedtime as needed for sleep. 90 tablet 1   No current facility-administered medications for this visit.    Allergies  Allergen Reactions  . Citalopram Nausea And Vomiting    Family History  Problem Relation Age of  Onset  . Stroke Father   . Depression Father   . Diabetes Mother   . Hyperlipidemia Mother   . Breast cancer Mother   . Heart murmur Son   . Heart murmur Son   . Heart murmur Son     Social History   Socioeconomic History  . Marital status: Widowed    Spouse name: Not on file  . Number of children: Not on file  . Years of education: Not on file  . Highest education level: Not on file  Occupational History  . Not on file  Tobacco Use  . Smoking status: Current Some Day Smoker    Packs/day: 0.10    Years: 61.00    Pack years: 6.10    Types: Cigarettes  . Smokeless tobacco: Former Systems developer    Types: Snuff  . Tobacco comment: .5 of 1 cigarette a day  Vaping Use  . Vaping Use: Never used  Substance and Sexual Activity  . Alcohol use: No  . Drug use: No  . Sexual activity: Never  Other Topics Concern  . Not  on file  Social History Narrative  . Not on file   Social Determinants of Health   Financial Resource Strain: Low Risk   . Difficulty of Paying Living Expenses: Not very hard  Food Insecurity: No Food Insecurity  . Worried About Charity fundraiser in the Last Year: Never true  . Ran Out of Food in the Last Year: Never true  Transportation Needs: No Transportation Needs  . Lack of Transportation (Medical): No  . Lack of Transportation (Non-Medical): No  Physical Activity: Inactive  . Days of Exercise per Week: 0 days  . Minutes of Exercise per Session: 0 min  Stress: No Stress Concern Present  . Feeling of Stress : Only a little  Social Connections: Socially Isolated  . Frequency of Communication with Friends and Family: More than three times a week  . Frequency of Social Gatherings with Friends and Family: More than three times a week  . Attends Religious Services: Never  . Active Member of Clubs or Organizations: No  . Attends Archivist Meetings: Never  . Marital Status: Widowed  Intimate Partner Violence: Not At Risk  . Fear of Current or Ex-Partner: No  . Emotionally Abused: No  . Physically Abused: No  . Sexually Abused: No     Constitutional: Denies fever, malaise, fatigue, headache or abrupt weight changes.  Respiratory: Denies difficulty breathing, shortness of breath, cough or sputum production.   Cardiovascular: Denies chest pain, chest tightness, palpitations or swelling in the hands or feet.  Gastrointestinal: Denies abdominal pain, bloating, constipation, diarrhea or blood in the stool.  GU: Denies urgency, frequency, pain with urination, burning sensation, blood in urine, odor or discharge. Musculoskeletal: Pt reports pain in the left side of her ribs. Denies decrease in range of motion, difficulty with gait, muscle pain or joint pain and swelling.  Skin: Denies redness, rashes, lesions or ulcercations.   No other specific complaints in a complete  review of systems (except as listed in HPI above).  Objective:   Physical Exam   BP 129/73 (BP Location: Right Arm, Patient Position: Sitting, Cuff Size: Normal)   Pulse 81   Temp (!) 97.5 F (36.4 C) (Temporal)   Resp 18   Ht $R'5\' 3"'aw$  (1.6 m)   Wt 124 lb 9.6 oz (56.5 kg)   SpO2 100%   BMI 22.07 kg/m   Wt Readings from Last  3 Encounters:  02/08/21 122 lb 14.4 oz (55.7 kg)  02/04/21 125 lb (56.7 kg)  02/02/21 125 lb (56.7 kg)    General: Appears her stated age, chronically ill appearing, in NAD. Skin: Abrasion noted over left lateral ribs. HEENT: Head: normal shape and size; Eyes: sclera white and EOMs intact;  Cardiovascular: Normal rate and rhythm. S1,S2 noted.  No murmur, rubs or gallops noted. Pulmonary/Chest: Normal effort and positive vesicular breath sounds. No respiratory distress. No wheezes, rales or ronchi noted.  Musculoskeletal: Pain with palpation of left lateral lower ribs.  No difficulty with gait.  Neurological: Alert and oriented.    BMET    Component Value Date/Time   NA 137 02/01/2021 1310   NA 141 01/18/2016 1150   NA 138 09/01/2014 2130   K 4.1 02/01/2021 1310   K 3.5 09/01/2014 2130   CL 102 02/01/2021 1310   CL 104 09/01/2014 2130   CO2 27 02/01/2021 1310   CO2 28 09/01/2014 2130   GLUCOSE 138 (H) 02/01/2021 1310   GLUCOSE 287 (H) 09/01/2014 2130   BUN 13 02/01/2021 1310   BUN 12 01/18/2016 1150   BUN 14 09/01/2014 2130   CREATININE 0.82 02/01/2021 1310   CREATININE 0.82 12/21/2020 1134   CALCIUM 9.5 02/01/2021 1310   CALCIUM 8.9 09/01/2014 2130   GFRNONAA >60 02/01/2021 1310   GFRNONAA 70 06/14/2020 0929   GFRAA 82 06/14/2020 0929    Lipid Panel     Component Value Date/Time   CHOL 92 12/21/2020 1134   CHOL 177 06/07/2015 1215   CHOL 141 09/02/2014 0440   TRIG 70 12/21/2020 1134   TRIG 155 09/02/2014 0440   HDL 51 12/21/2020 1134   HDL 56 06/07/2015 1215   HDL 37 (L) 09/02/2014 0440   CHOLHDL 1.8 12/21/2020 1134   VLDL 16  10/17/2019 0606   VLDL 31 09/02/2014 0440   LDLCALC 26 12/21/2020 1134   LDLCALC 73 09/02/2014 0440    CBC    Component Value Date/Time   WBC 4.3 02/01/2021 1310   RBC 3.55 (L) 02/01/2021 1310   HGB 11.2 (L) 02/01/2021 1310   HGB 12.7 09/01/2014 2130   HCT 33.8 (L) 02/01/2021 1310   HCT 37.9 09/01/2014 2130   PLT 205 02/01/2021 1310   PLT 260 09/05/2014 0517   MCV 95.2 02/01/2021 1310   MCV 92 09/01/2014 2130   MCH 31.5 02/01/2021 1310   MCHC 33.1 02/01/2021 1310   RDW 13.8 02/01/2021 1310   RDW 13.9 09/01/2014 2130   LYMPHSABS 1.5 02/01/2021 1310   MONOABS 0.2 02/01/2021 1310   EOSABS 0.2 02/01/2021 1310   BASOSABS 0.0 02/01/2021 1310    Hgb A1C Lab Results  Component Value Date   HGBA1C 5.8 (A) 12/21/2020           Assessment & Plan:   ER Follow Up for Multiple Rib Fractures s/p Fall:  ER notes, labs and imaging reviewed Encouraged deep breathing Will not refill Percocet at this time Continue Tylenol arthritis 3 x day as needed for moderate pain RX for Tramadol 50 mg Q8H prn for severe pain Encouraged splinting with movement, coughing and deep breathing  Return precautions disucssed Webb Silversmith, NP This visit occurred during the SARS-CoV-2 public health emergency.  Safety protocols were in place, including screening questions prior to the visit, additional usage of staff PPE, and extensive cleaning of exam room while observing appropriate contact time as indicated for disinfecting solutions.

## 2021-02-28 ENCOUNTER — Telehealth: Payer: Self-pay

## 2021-02-28 ENCOUNTER — Telehealth: Payer: Self-pay | Admitting: Pharmacist

## 2021-02-28 NOTE — Telephone Encounter (Signed)
  Chronic Care Management   Outreach Note  02/28/2021 Name: Kendra [name] MRN: [id number] DOB: 22-Jul-1946  Referred by: [name], NP Reason for referral : No chief complaint on file.  Was unable to reach patient via telephone today and have left HIPAA compliant voicemail asking patient to return my call.    Follow Up Plan: CM Pharmacist will attempt to reach patient by telephone again in the next 7 days  [name], PharmD, [place] [facility] [phone]

## 2021-03-02 ENCOUNTER — Telehealth: Payer: Self-pay | Admitting: Internal Medicine

## 2021-03-02 ENCOUNTER — Telehealth: Payer: Self-pay

## 2021-03-02 ENCOUNTER — Ambulatory Visit (INDEPENDENT_AMBULATORY_CARE_PROVIDER_SITE_OTHER): Payer: Medicare Other | Admitting: Pharmacist

## 2021-03-02 DIAGNOSIS — I10 Essential (primary) hypertension: Secondary | ICD-10-CM | POA: Diagnosis not present

## 2021-03-02 DIAGNOSIS — I152 Hypertension secondary to endocrine disorders: Secondary | ICD-10-CM

## 2021-03-02 DIAGNOSIS — E118 Type 2 diabetes mellitus with unspecified complications: Secondary | ICD-10-CM

## 2021-03-02 DIAGNOSIS — F3341 Major depressive disorder, recurrent, in partial remission: Secondary | ICD-10-CM | POA: Diagnosis not present

## 2021-03-02 DIAGNOSIS — E1159 Type 2 diabetes mellitus with other circulatory complications: Secondary | ICD-10-CM | POA: Diagnosis not present

## 2021-03-02 DIAGNOSIS — F17200 Nicotine dependence, unspecified, uncomplicated: Secondary | ICD-10-CM

## 2021-03-02 NOTE — Patient Instructions (Signed)
Visit Information  PATIENT GOALS:  Goals Addressed             This Visit's Progress    Pharmacy - Patient Goals       -Please continue to keep log of home blood sugar results and have for [place] to review during our calls -Please continue to keep log of blood pressure results and have for [place] to review during our calls  Our goal bad cholesterol, or LDL, is less than 70 . This is why it is important to continue taking your atorvastatin.  I look forward to talking to you during our next telephone appointment. Please call if you need something sooner!    [name], PharmD, [place] [phone]          The patient verbalized understanding of instructions, educational materials, and care plan provided today and declined offer to receive copy of patient instructions, educational materials, and care plan.   Telephone follow up appointment with care management team member scheduled for: 7/13 at 12:30 pm

## 2021-03-02 NOTE — Chronic Care Management (AMB) (Signed)
Chronic Care Management Pharmacy Note  03/02/2021 Name:  Kendra [name] MRN:  [id number] DOB:  25-Jan-1946  Summary:  Recommendations/Changes made from today's visit:  Plan:  Subjective: Kendra [name] is an 75 y.o. year old female who is a primary patient of [name], NP.  The CCM team was consulted for assistance with disease management and care coordination needs.    Engaged with patient by telephone for follow up visit in response to provider referral for pharmacy case management and/or care coordination services.   Consent to Services:  The patient was given information about Chronic Care Management services, agreed to services, and gave verbal consent prior to initiation of services.  Please see initial visit note for detailed documentation.   Patient Care Team: [name], NP as PCP - General (Internal Medicine) Steele Sizer, MD as Attending Physician (Family Medicine) Christene Lye, MD (General Surgery) Panagiotis Oelkers, Virl Diamond, RPH-CPP as Pharmacist Vanita Ingles, RN as Case Manager (General Practice) [place], [name], FNP as Nurse Practitioner (Family Medicine) [name], LCSW as Social Worker (Licensed Clinical Social Worker)  Recent office visits: Office Visit with PCP on 6/7 for ER Follow Up for Multiple Rib Fractures s/p fall  Hospital visits: ED Visit on 5/20 for rib fractures after a fall  Objective:  Lab Results  Component Value Date   CREATININE 0.82 02/01/2021   CREATININE 0.82 12/21/2020   CREATININE 0.66 07/20/2020    Lab Results  Component Value Date   HGBA1C 5.8 (A) 12/21/2020   Last diabetic Eye exam: No results found for: HMDIABEYEEXA  Last diabetic Foot exam: No results found for: HMDIABFOOTEX      Component Value Date/Time   CHOL 92 12/21/2020 1134   CHOL 177 06/07/2015 1215   CHOL 141 09/02/2014 0440   TRIG 70 12/21/2020 1134   TRIG 155 09/02/2014 0440   HDL 51 12/21/2020 1134   HDL 56 06/07/2015  1215   HDL 37 (L) 09/02/2014 0440   CHOLHDL 1.8 12/21/2020 1134   VLDL 16 10/17/2019 0606   VLDL 31 09/02/2014 0440   [place] 26 12/21/2020 1134   [place] 73 09/02/2014 0440    Hepatic Function Latest Ref Rng & Units 02/01/2021 12/21/2020 07/20/2020  Total Protein 6.5 - 8.1 g/dL 7.0 6.7 7.5  Albumin 3.5 - 5.0 g/dL 3.9 - 4.4  AST 15 - 41 U/L $Remo'17 14 20  'ggQvn$ ALT 0 - 44 U/L $Remo'18 17 13  'PzPmG$ Alk Phosphatase 38 - 126 U/L 64 - 67  Total Bilirubin 0.3 - 1.2 mg/dL 0.5 0.3 0.4    Clinical ASCVD: Yes  The ASCVD Risk score Mikey Bussing DC Jr., et al., 2013) failed to calculate for the following reasons:   The patient has a prior MI or stroke diagnosis     Social History   Tobacco Use  Smoking Status Some Days   Packs/day: 0.10   Years: 61.00   Pack years: 6.10   Types: Cigarettes  Smokeless Tobacco Former   Types: Snuff  Tobacco Comments   .5 of 1 cigarette a day   BP Readings from Last 3 Encounters:  02/22/21 129/73  02/08/21 133/83  02/04/21 124/72   Pulse Readings from Last 3 Encounters:  02/22/21 81  02/08/21 96  02/04/21 81   Wt Readings from Last 3 Encounters:  02/22/21 124 lb 9.6 oz (56.5 kg)  02/08/21 122 lb 14.4 oz (55.7 kg)  02/04/21 125 lb (56.7 kg)    Assessment: Review of  patient past medical history, allergies, medications, health status, including review of consultants reports, laboratory and other test data, was performed as part of comprehensive evaluation and provision of chronic care management services.   SDOH:  (Social Determinants of Health) assessments and interventions performed:  SDOH Interventions    Flowsheet Row Most Recent Value  SDOH Interventions   SDOH Interventions for the Following Domains Tobacco  Tobacco Interventions Other (Comment)  [Encourage patient to maintain smoking cessation]       CCM Care Plan  Allergies  Allergen Reactions   Citalopram Nausea And Vomiting    Medications Reviewed Today     Reviewed by Vella Raring, RPH-CPP  (Pharmacist) on 03/02/21 at 1311  Med List Status: <None>   Medication Order Taking? Sig Documenting Provider Last Dose Status Informant  Acetaminophen (TYLENOL 8 HOUR ARTHRITIS PAIN PO) [id number] Yes Take 2 capsules by mouth in the morning, at noon, in the evening, and at bedtime. [provider] Taking Active   albuterol (VENTOLIN HFA) 108 (90 Base) MCG/ACT inhaler [id number] Yes Inhale 1-2 Puffs every 6 hours as needed Shortness of Breath and wheeze Olin Hauser, DO Taking Active   aspirin 325 MG tablet [id number] Yes Take 325 mg by mouth daily. [provider] Taking Active   atorvastatin (LIPITOR) 40 MG tablet [id number] Yes TAKE 1 TABLET BY MOUTH BEDTIME Olin Hauser, DO Taking Active   Blood Glucose Monitoring Suppl [facility] VERIO) w/Device Drucie Opitz [id number]  USE AS DIRECTED [name] [name], FNP  Active   buPROPion (WELLBUTRIN XL) 150 MG 24 hr tablet [id number] Yes Take 1 tablet by mouth every morning. [provider] Taking Active   clopidogrel (PLAVIX) 75 MG tablet [id number] Yes Take 1 tablet by mouth daily. [provider] Taking Active   cyclobenzaprine (FLEXERIL) 5 MG tablet [id number] No Take 0.5-1 tablets (2.5-5 mg total) by mouth 3 (three) times daily as needed for muscle spasms.  Patient not taking: Reported on 03/02/2021   Olin Hauser, DO Not Taking Active   Fluticasone-Umeclidin-Vilant [name] ELLIPTA) 100-62.5-25 MCG/INH AEPB [id number] Yes USE ONE INHALATION INTO THE LUNGS ONCE A DAY RINSE MOUTH AFTER EACH USE Olin Hauser, DO Taking Active   glucose blood (ONETOUCH VERIO) test strip [id number]  Use as instructed Olin Hauser, DO  Active   Lancet Devices (ONE TOUCH DELICA LANCING DEV) MISC [id number]  1 Device by Does not apply route 2 (two) times daily. [name], FNP  Active   OneTouch Delica Lancets 30S MISC [id number]  1 Device by Does not apply route 2 (two) times daily. Olin Hauser, DO  Active   traZODone (DESYREL) 50 MG tablet [id number] Yes Take 0.5-1 tablets (25-50 mg total) by mouth at bedtime as needed for sleep. Kathrine Haddock, NP Taking Active             Patient Active Problem List   Diagnosis Date Noted   Hemiparesis affecting left side as late effect of stroke ([place]) 02/02/2021   Recurrent major depressive disorder, in partial remission ([place]) 02/02/2021   Carotid artery stenosis 12/30/2019   Hypertension associated with diabetes ([place]) 12/19/2018   Diabetes mellitus type 2, controlled, with complications ([place]) 25/63/8937   Insomnia 03/30/2015   Hyperlipidemia associated with type 2 diabetes mellitus ([place]) 03/30/2015   Gastro-esophageal reflux disease without esophagitis 03/30/2015   History of cerebrovascular accident with residual deficit 03/30/2015   Osteoarthritis 03/30/2015   Osteopenia 03/30/2015   B12 deficiency 03/30/2015    Immunization  History  Administered Date(s) Administered   Fluad Quad(high Dose 65+) 05/21/2019   Influenza Split 06/20/2012   Influenza, High Dose Seasonal PF 07/26/2016, 06/14/2017, 07/31/2018   Influenza, Seasonal, Injecte, Preservative Fre 08/04/2010   Influenza,inj,Quad PF,6+ Mos 06/10/2013, 05/26/2014, 06/07/2015   Influenza-Unspecified 05/26/2014, 06/18/2018   PFIZER(Purple Top)SARS-COV-2 Vaccination 03/02/2020, 03/30/2020   Pneumococcal Conjugate-13 10/31/2007, 10/21/2014   Pneumococcal Polysaccharide-23 10/31/2007, 06/20/2012, 07/31/2018   Tdap 08/04/2010   Zoster, Live 06/10/2013    Conditions to be addressed/monitored: T2DM, HLD, history of stroke, tobacco use  Care Plan : PharmD - Medication Mgmt  Updates made by Vella Raring, RPH-CPP since 03/02/2021 12:00 AM     Problem: Disease Progression      Long-Range Goal: Disease Progression Prevented or Minimized   Start Date: 11/01/2020  Expected End Date: 01/30/2021  This Visit's Progress: On track  Recent Progress: On track   Priority: High  Note:   Current Barriers:  Financial Barriers Limited social support  Pharmacist Clinical Goal(s):  Over the next 90 days, patient will maintain adherence to monitoring guidelines and medication adherence to achieve therapeutic efficacy through collaboration with PharmD and provider.   Interventions: 1:1 collaboration with Webb Silversmith, NP regarding development and update of comprehensive plan of care as evidenced by provider attestation and co-signature Inter-disciplinary care team collaboration (see longitudinal plan of care) Perform chart review. Patient seen for Office Visit with PCP on 6/7 for ER Follow Up for Multiple Rib Fractures s/p fall Comprehensive medication review performed; medication list updated in electronic medical record Reports continues to take medication from pill pack as packaged by [name] Reports she is taking acetaminophen and tramadol each as directed by PCP only as needed for pain Reports tramadol makes her sleepy, so she has been laying down after taking it.  Coordination of care: Placed referral to Care Guide team for assistance to patient with OTC benefit card and to help determine if patient might qualify for any community resources for a new walker on 5/25. From review of chart, note Care Guide Claretta Fraise was unsuccessful in reaching patient on 5/27. Today provide patient with phone number to and encourage patient to return call to Care Guide.   T2DM: Current treatment: none Denies monitoring home blood sugar recently Encourage patient to return call to Care Guide for help with using OTC benefit to obtain Glucerna Encourage patient to keep a log when she monitors and bring record with her to medical appointments   HTN: Current treatment: none Reports recent home BP readings: 6/15: 123/90, HR 92 (states was in pain) 6/14: 116/86, HR 92 Encourage patient to keep a log when she monitors and bring record with her to medical  appointments  Smoking Cessation: Congratulate patient as she reports having quit smoking for ~2 weeks Discuss avoiding triggers, such as being around others who are smoking, to maintain cessation  Patient Goals/Self-Care Activities Over the next 90 days, patient will:  - take medications as prescribed  Using pill packaging from [facility] - check glucose, document, and provide at future appointments - check blood pressure, document, and provide at future appointments  Follow Up Plan: Telephone follow up appointment with care management team member scheduled for: 7/13 at 12:30 pm      Patient's preferred pharmacy is:  [facility], [address] [place] [address] Phone: [phone] Fax: [phone]  [id number] [address]  [place] [address] Phone: [phone] Fax: [phone]  [facility] [place], [place] [address] Phone: [phone] Fax: [phone]   Follow Up:  Patient agrees to Care Plan and Follow-up.  [name], PharmD, [place], CPP Clinical Pharmacist [name] [phone]

## 2021-03-02 NOTE — Telephone Encounter (Signed)
   Telephone encounter was:  Successful.  03/02/2021 Name: Kendra [name] MRN: [id number] DOB: 07/21/1946  [name] is a 75 y.o. year old female who is a primary care patient of [name], NP . The community resource team was consulted for assistance with  medical equipment  Care guide performed the following interventions: Patient provided with information about care guide support team and interviewed to confirm resource needs Discussed resources to assist with finding her a lift chair and a walker. I let pt know that I will talk to her insurance company and see what they will cover and let her know that we can find resources for the walker and lift chair through [facility] .  Follow Up Plan:  Care guide will follow up with patient by phone over the next week and Care guide will outreach resources to assist patient with [facility] and finding her a lift chair  [place], Care Management Phone: [phone] Email: [email or web address]

## 2021-03-07 ENCOUNTER — Other Ambulatory Visit: Payer: Self-pay | Admitting: Internal Medicine

## 2021-03-07 ENCOUNTER — Telehealth: Payer: Self-pay | Admitting: Internal Medicine

## 2021-03-07 ENCOUNTER — Other Ambulatory Visit: Payer: Self-pay | Admitting: Family Medicine

## 2021-03-07 DIAGNOSIS — R269 Unspecified abnormalities of gait and mobility: Secondary | ICD-10-CM

## 2021-03-07 NOTE — Telephone Encounter (Signed)
   Telephone encounter was:  Successful.  03/07/2021 Name: Kendra [name] MRN: [id number] DOB: 1945/10/20  Kendra [name] is a 75 y.o. year old female who is a primary care patient of [name], NP . The community resource team was consulted for assistance with  needing durable medical equipment  Care guide performed the following interventions: Discussed resources to assist with getting her a walker and a power lift chair. I placed a in basket referral message to NP Webb Silversmith to please place a referral for the walker pt needs to a durable medical equipment company to see if the patient's insurance would cover that. If they don't CG will work on finding one in the community that has been donated. CG looked up a lift chair and found one at [facility] for $479.00; placed a request with [employer] to see if they could have this covered by the foundation. It is a IT sales professional in [place]. Obtained verbal consent to place patient referral to [facility]. Placed referral to [name] via [place] request .  Follow Up Plan:  Care guide will follow up with patient by phone over the next week and Care guide will outreach resources to assist patient with week  [place], Care Management Phone: [phone] Email: [email or web address]

## 2021-03-07 NOTE — Telephone Encounter (Signed)
  Notes to clinic: medication filled by a historical provider Review for continued use and refill    Requested Prescriptions  Pending Prescriptions Disp Refills   buPROPion (WELLBUTRIN XL) 150 MG 24 hr tablet [Pharmacy Med Name: BUPROPION HCL ER (XL) 150 MG TAB] 90 tablet     Sig: TAKE 1 TABLET BY MOUTH ONCE DAILY      Psychiatry: Antidepressants - bupropion Passed - 03/07/2021  1:59 PM      Passed - Completed PHQ-2 or PHQ-9 in the last 360 days      Passed - Last BP in normal range    BP Readings from Last 1 Encounters:  02/22/21 129/73          Passed - Valid encounter within last 6 months    Recent Outpatient Visits           1 week ago Closed fracture of multiple ribs of left side, initial encounter   [name] [place], Coralie Keens, NP   1 month ago Left hand pain   [facility] [place], [place] W, NP   2 months ago Uncontrolled type 2 diabetes mellitus with insulin therapy [name])   [facility] Kathrine Haddock, NP   5 months ago Controlled type 2 diabetes mellitus with complication, with long-term current use of insulin [facility])   [facility], [name], FNP   8 months ago Insomnia, unspecified type   [place], FNP       Future Appointments             In 5 months [name], Coralie Keens, NP [facility], [facility]

## 2021-03-08 ENCOUNTER — Ambulatory Visit: Payer: Self-pay | Admitting: Licensed Clinical Social Worker

## 2021-03-08 DIAGNOSIS — F3341 Major depressive disorder, recurrent, in partial remission: Secondary | ICD-10-CM

## 2021-03-08 DIAGNOSIS — I6930 Unspecified sequelae of cerebral infarction: Secondary | ICD-10-CM

## 2021-03-08 DIAGNOSIS — E118 Type 2 diabetes mellitus with unspecified complications: Secondary | ICD-10-CM

## 2021-03-08 DIAGNOSIS — I10 Essential (primary) hypertension: Secondary | ICD-10-CM

## 2021-03-08 NOTE — Chronic Care Management (AMB) (Signed)
Chronic Care Management    Clinical Social Work Note  03/08/2021 Name: Kendra [name] MRN: [id number] DOB: 25-Oct-1945  Kendra [name] is a 75 y.o. year old female who is a primary care patient of [name], NP. The CCM team was consulted to assist the patient with chronic disease management and/or care coordination needs related to: Mental Health Counseling and Resources.   Engaged with patient by telephone for follow up visit in response to provider referral for social work chronic care management and care coordination services.   Consent to Services:  The patient was given information about Chronic Care Management services, agreed to services, and gave verbal consent prior to initiation of services.  Please see initial visit note for detailed documentation.   Patient agreed to services and consent obtained.   Assessment: Patient is making progress with management of depression symptoms , but continues to have difficulty with pain management of fractured ribs sustained after fall on 02/04/21 . Patient reports that pain medications are ineffective in managing pain throughout the day. Patient was encouraged to utilize strategies provided by PCP. See Care Plan below for interventions and patient self-care actives. Recent life changes /stressors: Pain management Recommendation: Patient may benefit from, and is in agreement to work with LCSW to address care coordination needs and will continue to work with the clinical team to address health care and disease management related needs.  Follow up Plan: Patient would like continued follow-up.  CCM LCSW will follow up with patient on 04/26/21. Patient will call office if needed prior to next encounter.  SDOH (Social Determinants of Health) assessments and interventions performed:  NA  Advanced Directives Status: Not addressed in this encounter.  CCM Care Plan  Allergies  Allergen Reactions   Citalopram Nausea And Vomiting     Outpatient Encounter Medications as of 03/08/2021  Medication Sig   Acetaminophen (TYLENOL 8 HOUR ARTHRITIS PAIN PO) Take 2 capsules by mouth in the morning, at noon, in the evening, and at bedtime.   albuterol (VENTOLIN HFA) 108 (90 Base) MCG/ACT inhaler Inhale 1-2 Puffs every 6 hours as needed Shortness of Breath and wheeze   aspirin 325 MG tablet Take 325 mg by mouth daily.   atorvastatin (LIPITOR) 40 MG tablet TAKE 1 TABLET BY MOUTH BEDTIME   Blood Glucose Monitoring Suppl (ONETOUCH VERIO) w/Device KIT USE AS DIRECTED   buPROPion (WELLBUTRIN XL) 150 MG 24 hr tablet TAKE 1 TABLET BY MOUTH ONCE DAILY   clopidogrel (PLAVIX) 75 MG tablet Take 1 tablet by mouth daily.   cyclobenzaprine (FLEXERIL) 5 MG tablet Take 0.5-1 tablets (2.5-5 mg total) by mouth 3 (three) times daily as needed for muscle spasms. (Patient not taking: Reported on 03/02/2021)   Fluticasone-Umeclidin-Vilant (TRELEGY ELLIPTA) 100-62.5-25 MCG/INH AEPB USE ONE INHALATION INTO THE LUNGS ONCE A DAY RINSE MOUTH AFTER EACH USE   glucose blood (ONETOUCH VERIO) test strip Use as instructed   Lancet Devices (ONE TOUCH DELICA LANCING DEV) MISC 1 Device by Does not apply route 2 (two) times daily.   OneTouch Delica Lancets 07A MISC 1 Device by Does not apply route 2 (two) times daily.   traZODone (DESYREL) 50 MG tablet Take 0.5-1 tablets (25-50 mg total) by mouth at bedtime as needed for sleep.   No facility-administered encounter medications on file as of 03/08/2021.    Patient Active Problem List   Diagnosis Date Noted   Hemiparesis affecting left side as late effect of stroke ([place]) 02/02/2021   Recurrent major depressive  disorder, in partial remission (HCC) 02/02/2021   Carotid artery stenosis 12/30/2019   Hypertension associated with diabetes (HCC) 12/19/2018   Diabetes mellitus type 2, controlled, with complications (HCC) 06/06/2015   Insomnia 03/30/2015   Hyperlipidemia associated with type 2 diabetes mellitus (HCC)  03/30/2015   Gastro-esophageal reflux disease without esophagitis 03/30/2015   History of cerebrovascular accident with residual deficit 03/30/2015   Osteoarthritis 03/30/2015   Osteopenia 03/30/2015   B12 deficiency 03/30/2015    Conditions to be addressed/monitored: DMII; Mental Health Concerns   Care Plan : General Social Work (Adult)  Updates made by Bridgett Larsson, LCSW since 03/08/2021 12:00 AM     Problem: Coping Skills (General Plan of Care)      Long-Range Goal: Coping Skills Enhanced   Start Date: 12/14/2020  This Visit's Progress: On track  Recent Progress: On track  Priority: Medium  Note:   Timeframe:  Long-Range Goal Priority:  Medium Start Date:   12/14/20                    Expected End Date: 05/18/21                Follow Up Date -04/26/21  Current Barriers:  Financial constraints Limited social support ADL IADL limitations Mental Health Concerns  Social Isolation Limited access to caregiver Inability to perform ADL's independently Inability to perform IADL's independently Lacks knowledge of community resource: grief support resources within the area  Clinical Social Work Clinical Goal(s):  Over the next 120 days, patient will work with SW to address concerns related to gaining additional support within the home and resource connection in order to maintain health and independency within the community  Over the next 120 days, patient will demonstrate improved adherence to self care as evidenced by implementing healthy self-care into her daily routine such as: attending all medical appointments, taking time for self-reflection, taking medications as prescribed, drinking water and daily exercise to improve mobility.  Interventions: Patient interviewed and appropriate assessments performed Patient reports that she is in pain throughout the day. States, "I'll be happy when my ribs heal" Patient reports compliance with pain medications; however, states they are  not effective in managing the pain throughout the day.  CCM LCSW inquired about patient's depression symptoms. Patient reports that her symptoms have been managed well despite the ongoing pain. CCM LCSW provided validation and support. Patient was encouraged to continue utilizing strategies discussed with provider to assist with pain relief.  Patient is sleeping in her bed. States she may need a new mattress because she can feel the springs CCM LCSW informed patient that an order for a walker has been completed. CCM Care Guides are looking into funding for lift and will be in contact within 1-2 weeks. Pt verbalized understanding and was appreciative for the assistance. Patient denies any additional concerns or resource needs Reports compliance with medications and management of depression symptoms Discussed plans with patient for ongoing care management follow up and provided patient with direct contact information for care management team Emotional Support provided due to ongoing health complications.  1:1 collaboration with PCP regarding development and update of comprehensive plan of care as evidenced by provider attestation and co-signature  Discussed plans with patient for ongoing care management follow up and provided patient with direct contact information for care management team Advised patient to contact CCM providers if she has any concerns in regards to her safety, housing or well-being. Patient reports being currently stable.  Patient Self Care Activities:  Attend all scheduled provider appointments Call provider office for new concerns or questions Utilize healthy coping skills discussed to assist with pain and depression management Continue to comply with med management        Christa See, MSW, [name] Phone [phone] 9:55 AM

## 2021-03-08 NOTE — Patient Instructions (Signed)
Visit Information   Goals Addressed               This Visit's Progress     Patient Stated     "I probably could benefit from more support." (pt-stated)   On track     Patient Self Care Activities:  Attend all scheduled provider appointments Call provider office for new concerns or questions Utilize healthy coping skills discussed to assist with pain and depression management Continue to comply with med management       SW-I probably could use some extra support right now. (pt-stated)   On track     Patient Self Care Activities:  Attend all scheduled provider appointments Call provider office for new concerns or questions Utilize healthy coping skills discussed to assist with pain and depression management Continue to comply with med management         Patient verbalizes understanding of instructions provided today.   Telephone follow up appointment with care management team member scheduled for:04/26/21  Christa See, MSW, [email or web address] Phone [phone] 9:57 AM

## 2021-03-09 ENCOUNTER — Ambulatory Visit: Payer: Self-pay | Admitting: *Deleted

## 2021-03-09 NOTE — Telephone Encounter (Signed)
Reason for Disposition  MILD constipation  Answer Assessment - Initial Assessment Questions 1. STOOL PATTERN OR FREQUENCY: "How often do you have a bowel movement (BM)?"  (Normal range: 3 times a day to every 3 days)  "When was your last BM?"       No BM in 3 days.   I don't usually have a problem going to the bathroom.   It might be the Tramadol for muscle spasms.   I'm out of it now.   They told me to take Tylenol 3 times a day for the pain. 2. STRAINING: "Do you have to strain to have a BM?"      Yes 3. RECTAL PAIN: "Does your rectum hurt when the stool comes out?" If Yes, ask: "Do you have hemorrhoids? How bad is the pain?"  (Scale 1-10; or mild, moderate, severe)     It hurts when I strain.   4. STOOL COMPOSITION: "Are the stools hard?"      Stool was hard 5. BLOOD ON STOOLS: "Has there been any blood on the toilet tissue or on the surface of the BM?" If Yes, ask: "When was the last time?"      No blood 6. CHRONIC CONSTIPATION: "Is this a new problem for you?"  If no, ask: "How long have you had this problem?" (days, weeks, months)      Not usually    It's from the Tramadol.   7. CHANGES IN DIET OR HYDRATION: "Have there been any recent changes in your diet?" "How much fluids are you drinking on a daily basis?"  "How much have you had to drink today?"     No 8. MEDICATIONS: "Have you been taking any new medications?" "Are you taking any narcotic pain medications?" (e.g., Vicodin, Percocet, morphine, Dilaudid)     Tramadol.   9. LAXATIVES: "Have you been using any stool softeners, laxatives, or enemas?"  If yes, ask "What, how often, and when was the last time?"     No 10. ACTIVITY:  "How much walking do you do every day?"  "Has your activity level decreased in the past week?"        I'm [place] walking as usual 11. CAUSE: "What do you think is causing the constipation?"        The Tramadol 12. OTHER SYMPTOMS: "Do you have any other symptoms?" (e.g., abdominal pain, bloating, fever,  vomiting)       No abd pain or bloating.   Not passing gas. 13. MEDICAL HISTORY: "Do you have a history of hemorrhoids, rectal fissures, or rectal surgery or rectal abscess?"         No 14. PREGNANCY: "Is there any chance you are pregnant?" "When was your last menstrual period?"       N/A due to age  Protocols used: Constipation-A-AH

## 2021-03-09 NOTE — Telephone Encounter (Signed)
I returned pt's call.    She has been on Tramadol for pain.   She is no longer on it.    She has not been able to have a BM for 3 days.   She is requesting Webb Silversmith, NP call in an order for a stool softener to [facility] so it can be delivered to her house.    "They will deliver it if someone from the dr. Gabriel Carina calls it in".    "I don't want a laxative but a stool softener".  She does not have any kind of laxative or stool softener in the house.   She doesn't have a way to the store to get any either.    "My son usually does those things for me but he is not here".     I let her know I would send her request to [facility] for Webb Silversmith, NP requesting she call in a medication to assist you with the constipation.  She thanked me very much for my help.  I sent my notes to [facility].

## 2021-03-10 ENCOUNTER — Telehealth: Payer: Self-pay | Admitting: Internal Medicine

## 2021-03-10 MED ORDER — DOCUSATE SODIUM 100 MG PO CAPS: 100.0000 mg | ORAL_CAPSULE | Freq: Two times a day (BID) | ORAL | 0 refills | Status: DC | PRN

## 2021-03-10 NOTE — Addendum Note (Signed)
Addended by: [name] on: 03/10/2021 07:14 AM   Modules accepted: Orders

## 2021-03-10 NOTE — Telephone Encounter (Signed)
   Telephone encounter was:  Successful.  03/10/2021 Name: Kendra [name] MRN: [id number] DOB: May 29, 1946  [name] is a 75 y.o. year old female who is a primary care patient of [name], NP . The community resource team was consulted for assistance with  medical devices  Care guide performed the following interventions: Follow up call placed to community resources to determine status of patients referral Follow up call placed to the patient to discuss status of referral [facility] has approved and will have delivered the lift chair to pt in the next couple of weeks, confirmed address and let pt know that this will be happening .  Follow Up Plan:  No further follow up planned at this time. The patient has been provided with needed resources.  [place], Care Management Phone: [phone] Email: [email or web address]

## 2021-03-10 NOTE — Telephone Encounter (Signed)
The pt was notified of Rollene Fare recommendation.

## 2021-03-10 NOTE — Telephone Encounter (Signed)
I have sent Colace to [facility].  She may want to also consider taking MiraLAX as well.

## 2021-03-11 ENCOUNTER — Inpatient Hospital Stay: Payer: Medicare Other | Attending: Oncology

## 2021-03-11 DIAGNOSIS — E538 Deficiency of other specified B group vitamins: Secondary | ICD-10-CM | POA: Diagnosis not present

## 2021-03-11 DIAGNOSIS — D649 Anemia, unspecified: Secondary | ICD-10-CM | POA: Diagnosis not present

## 2021-03-11 DIAGNOSIS — F17210 Nicotine dependence, cigarettes, uncomplicated: Secondary | ICD-10-CM | POA: Insufficient documentation

## 2021-03-11 DIAGNOSIS — Z79899 Other long term (current) drug therapy: Secondary | ICD-10-CM | POA: Insufficient documentation

## 2021-03-11 MED ORDER — CYANOCOBALAMIN 1000 MCG/ML IJ SOLN
1000.0000 ug | Freq: Once | INTRAMUSCULAR | Status: AC
  Administered 2021-03-11: 1000 ug via INTRAMUSCULAR
  Filled 2021-03-11: qty 1

## 2021-03-25 ENCOUNTER — Other Ambulatory Visit: Payer: Self-pay | Admitting: Internal Medicine

## 2021-03-25 DIAGNOSIS — G4700 Insomnia, unspecified: Secondary | ICD-10-CM

## 2021-03-25 MED ORDER — TRAZODONE HCL 50 MG PO TABS: 25.0000 mg | ORAL_TABLET | Freq: Every evening | ORAL | 0 refills | Status: DC | PRN

## 2021-03-25 NOTE — Telephone Encounter (Signed)
Appointment this month.

## 2021-03-25 NOTE — Telephone Encounter (Signed)
Medication Refill - Medication:   traZODone (DESYREL) 50 MG tablet  Has the patient contacted their pharmacy? Yes.  No refills left.    Preferred Pharmacy (with phone number or street name):   [name] [place], [place] [address]  Phone: [phone] Fax: [phone]    Agent: Please be advised that RX refills may take up to 3 business days. We ask that you follow-up with your pharmacy.

## 2021-03-30 ENCOUNTER — Ambulatory Visit (INDEPENDENT_AMBULATORY_CARE_PROVIDER_SITE_OTHER): Payer: Medicare Other | Admitting: Pharmacist

## 2021-03-30 DIAGNOSIS — E118 Type 2 diabetes mellitus with unspecified complications: Secondary | ICD-10-CM

## 2021-03-30 DIAGNOSIS — E1159 Type 2 diabetes mellitus with other circulatory complications: Secondary | ICD-10-CM

## 2021-03-30 DIAGNOSIS — I152 Hypertension secondary to endocrine disorders: Secondary | ICD-10-CM

## 2021-03-30 DIAGNOSIS — F17200 Nicotine dependence, unspecified, uncomplicated: Secondary | ICD-10-CM

## 2021-03-30 DIAGNOSIS — J432 Centrilobular emphysema: Secondary | ICD-10-CM

## 2021-03-30 NOTE — Chronic Care Management (AMB) (Signed)
Chronic Care Management Pharmacy Note  03/30/2021 Name:  NECIA KAMM MRN:  [id number] DOB:  09/09/1946   Subjective: Kendra [name] is an 75 y.o. year old female who is a primary patient of [name], NP.  The CCM team was consulted for assistance with disease management and care coordination needs.    Engaged with patient by telephone for follow up visit in response to provider referral for pharmacy case management and/or care coordination services.   Consent to Services:  The patient was given information about Chronic Care Management services, agreed to services, and gave verbal consent prior to initiation of services.  Please see initial visit note for detailed documentation.   Patient Care Team: [name], NP as PCP - General (Internal Medicine) Steele Sizer, MD as Attending Physician (Family Medicine) Christene Lye, MD (General Surgery) Kaelea Gathright, Virl Diamond, RPH-CPP as Pharmacist Vanita Ingles, RN as Case Manager (General Practice) Malfi, [name], FNP as Nurse Practitioner (Family Medicine) [name], LCSW as Social Worker (Licensed Clinical Social Worker)  Recent office visits: None  Hospital visits: ED Visit on 5/20 for rib fractures after a fall  Objective:  Lab Results  Component Value Date   CREATININE 0.82 02/01/2021   CREATININE 0.82 12/21/2020   CREATININE 0.66 07/20/2020    Lab Results  Component Value Date   HGBA1C 5.8 (A) 12/21/2020   Last diabetic Eye exam: No results found for: HMDIABEYEEXA  Last diabetic Foot exam: No results found for: HMDIABFOOTEX      Component Value Date/Time   CHOL 92 12/21/2020 1134   CHOL 177 06/07/2015 1215   CHOL 141 09/02/2014 0440   TRIG 70 12/21/2020 1134   TRIG 155 09/02/2014 0440   HDL 51 12/21/2020 1134   HDL 56 06/07/2015 1215   HDL 37 (L) 09/02/2014 0440   CHOLHDL 1.8 12/21/2020 1134   VLDL 16 10/17/2019 0606   VLDL 31 09/02/2014 0440   [place] 26 12/21/2020 1134    [place] 73 09/02/2014 0440    Hepatic Function Latest Ref Rng & Units 02/01/2021 12/21/2020 07/20/2020  Total Protein 6.5 - 8.1 g/dL 7.0 6.7 7.5  Albumin 3.5 - 5.0 g/dL 3.9 - 4.4  AST 15 - 41 U/L _0 ALT 0 - 44 U/L _1 Alk Phosphatase 38 - 126 U/L 64 - 67  Total Bilirubin 0.3 - 1.2 mg/dL 0.5 0.3 0.4    Clinical ASCVD: Yes  The ASCVD Risk score Mikey Bussing DC Jr., et al., 2013) failed to calculate for the following reasons:   The patient has a prior MI or stroke diagnosis    Social History   Tobacco Use  Smoking Status Some Days   Packs/day: 0.10   Years: 61.00   Pack years: 6.10   Types: Cigarettes  Smokeless Tobacco Former   Types: Snuff  Tobacco Comments   .5 of 1 cigarette a day   BP Readings from Last 3 Encounters:  02/22/21 129/73  02/08/21 133/83  02/04/21 124/72   Pulse Readings from Last 3 Encounters:  02/22/21 81  02/08/21 96  02/04/21 81   Wt Readings from Last 3 Encounters:  02/22/21 124 lb 9.6 oz (56.5 kg)  02/08/21 122 lb 14.4 oz (55.7 kg)  02/04/21 125 lb (56.7 kg)    Assessment: Review of patient past medical history, allergies, medications, health status, including review of consultants reports, laboratory and other test data, was performed as part of comprehensive evaluation  and provision of chronic care management services.   SDOH:  (Social Determinants of Health) assessments and interventions performed: none   CCM Care Plan  Allergies  Allergen Reactions   Citalopram Nausea And Vomiting    Medications Reviewed Today     Reviewed by Vella Raring, RPH-CPP (Pharmacist) on 03/30/21 at [place] List Status: <None>   Medication Order Taking? Sig Documenting Provider Last Dose Status Informant  Acetaminophen (TYLENOL 8 HOUR ARTHRITIS PAIN PO) [id number] Yes Take 2 capsules by mouth in the morning, at noon, in the evening, and at bedtime. [provider] Taking Active   albuterol (VENTOLIN HFA) 108 (90 Base) MCG/ACT  inhaler [id number] Yes Inhale 1-2 Puffs every 6 hours as needed Shortness of Breath and wheeze Olin Hauser, DO Taking Active   aspirin 325 MG tablet [id number] Yes Take 325 mg by mouth daily. [provider] Taking Active   atorvastatin (LIPITOR) 40 MG tablet [id number] Yes TAKE 1 TABLET BY MOUTH BEDTIME Olin Hauser, DO Taking Active   Blood Glucose Monitoring Suppl [name] VERIO) w/Device KIT [id number]  USE AS DIRECTED [name], FNP  Active   buPROPion (WELLBUTRIN XL) 150 MG 24 hr tablet [id number] Yes TAKE 1 TABLET BY MOUTH ONCE DAILY [name], Coralie Keens, NP Taking Active   clopidogrel (PLAVIX) 75 MG tablet [id number] Yes Take 1 tablet by mouth daily. [provider] Taking Active   docusate sodium (COLACE) 100 MG capsule [id number] Yes Take 1 capsule (100 mg total) by mouth 2 (two) times daily as needed for mild constipation. [name], NP Taking Active   Fluticasone-Umeclidin-Vilant (TRELEGY ELLIPTA) 100-62.5-25 MCG/INH AEPB [id number] Yes USE ONE INHALATION INTO THE LUNGS ONCE A DAY RINSE MOUTH AFTER EACH USE Olin Hauser, DO Taking Active   glucose blood (ONETOUCH VERIO) test strip [id number]  Use as instructed Olin Hauser, DO  Active   Lancet Devices (ONE TOUCH DELICA LANCING DEV) MISC [id number]  1 Device by Does not apply route 2 (two) times daily. [name], FNP  Active   OneTouch Delica Lancets 48O MISC [id number]  1 Device by Does not apply route 2 (two) times daily. Olin Hauser, DO  Active   traZODone (DESYREL) 50 MG tablet [id number] Yes Take 0.5-1 tablets (25-50 mg total) by mouth at bedtime as needed for sleep. [name], NP Taking Active             Patient Active Problem List   Diagnosis Date Noted   Hemiparesis affecting left side as late effect of stroke ([place]) 02/02/2021   Recurrent major depressive disorder, in partial remission ([place]) 02/02/2021   Carotid artery stenosis  12/30/2019   Hypertension associated with diabetes ([place]) 12/19/2018   Diabetes mellitus type 2, controlled, with complications ([place]) 19/75/8832   Insomnia 03/30/2015   Hyperlipidemia associated with type 2 diabetes mellitus ([place]) 03/30/2015   Gastro-esophageal reflux disease without esophagitis 03/30/2015   History of cerebrovascular accident with residual deficit 03/30/2015   Osteoarthritis 03/30/2015   Osteopenia 03/30/2015   B12 deficiency 03/30/2015    Immunization History  Administered Date(s) Administered   Fluad Quad(high Dose 65+) 05/21/2019   Influenza Split 06/20/2012   Influenza, High Dose Seasonal PF 07/26/2016, 06/14/2017, 07/31/2018   Influenza, Seasonal, Injecte, Preservative Fre 08/04/2010   Influenza,inj,Quad PF,6+ Mos 06/10/2013, 05/26/2014, 06/07/2015   Influenza-Unspecified 05/26/2014, 06/18/2018   PFIZER(Purple Top)SARS-COV-2 Vaccination 03/02/2020, 03/30/2020   Pneumococcal Conjugate-13 10/31/2007, 10/21/2014   Pneumococcal Polysaccharide-23 10/31/2007, 06/20/2012, 07/31/2018  Tdap 08/04/2010   Zoster, Live 06/10/2013    Conditions to be addressed/monitored: HTN, HLD, COPD, and DMII  Care Plan : PharmD - Medication Mgmt  Updates made by Vella Raring, RPH-CPP since 03/30/2021 12:00 AM     Problem: Disease Progression      Long-Range Goal: Disease Progression Prevented or Minimized   Start Date: 11/01/2020  Expected End Date: 01/30/2021  This Visit's Progress: On track  Recent Progress: On track  Priority: High  Note:   Current Barriers:  Financial Barriers Limited social support  Pharmacist Clinical Goal(s):  Over the next 90 days, patient will maintain adherence to monitoring guidelines and medication adherence to achieve therapeutic efficacy through collaboration with PharmD and provider.   Interventions: 1:1 collaboration with Webb Silversmith, NP regarding development and update of comprehensive plan of care as evidenced by provider  attestation and co-signature Inter-disciplinary care team collaboration (see longitudinal plan of care) Perform chart review. Patient called Office on 6/22 requesting a medication be called into her pharmacy for constipation Today patient reports constipation resolved with taking docusate Reports continues to take docusate as needed and staying hydrated Comprehensive medication review performed; medication list updated in electronic medical record Reports occasionally has dizziness when wakes up overnight, but denies recent fall Reports when takes trazodone, typically takes a full tablet (50 mg) at bedtime. Encourage patient to try taking 1/2 tablet (25 mg) at bedtime as needed instead to see if reduces dizziness Reports no longer taking cyclobenzaprine Encourage patient to use caution, take positional changes slowly Patient reports interested in following up with Care Guide regarding obtaining a new walker or cane but does not have her number. Provide patient with phone number for Care Guide  Medication Adherence: Reports continues to take medication from pill pack as packaged by [name] patient to use maintenance inhaler, Trelegy, daily and rinse mouth out after each use and rescue inhaler, albuterol, as needed as directed  T2DM: Current treatment: none Reports recent home blood sugar readings ranging: 121-153 Encourage patient to return call to Care Guide for help with using OTC benefit to obtain Glucerna Encourage patient to keep a log when she monitors and bring record with her to medical appointments   HTN: Current treatment: none Reports recent home BP readings: Today: 127/98, HR 94 (states was upset whe took the reading) Encourage patient to keep a log when she monitors and bring record with her to medical appointments  Smoking Cessation: Reports recently smoking some, but not everyday (<1 cigarette/day) Discuss avoiding triggers, such as being around others who are  smoking, to maintain cessation  Patient Goals/Self-Care Activities Over the next 90 days, patient will:  - take medications as prescribed  Using pill packaging from [facility] - check glucose, document, and provide at future appointments - check blood pressure, document, and provide at future appointments  Follow Up Plan: Telephone follow up appointment with care management team member scheduled for: 05/16/2021 at 10:15 AM       Patient's preferred pharmacy is:  [facility], [address] [place] [address] Phone: [phone] Fax: [phone]  [id number] [id number]: [phone]: [phone] [facility] [place]: [phone]: [phone] Uses pill box? Pill pack  Follow Up:  Patient agrees to Care Plan and Follow-up.  [name], PharmD, [place], CPP Clinical Pharmacist [name] [phone]

## 2021-03-30 NOTE — Patient Instructions (Signed)
Visit Information  PATIENT GOALS:  Goals Addressed             This Visit's Progress    Pharmacy - Patient Goals       -Please continue to keep log of home blood sugar results and have for [place] to review during our calls -Please continue to keep log of blood pressure results and have for [place] to review during our calls  Our goal bad cholesterol, or LDL, is less than 70 . This is why it is important to continue taking your atorvastatin.  I look forward to talking to you during our next telephone appointment. Please call if you need something sooner!   [name], PharmD, [place] [phone]          The patient verbalized understanding of instructions, educational materials, and care plan provided today and declined offer to receive copy of patient instructions, educational materials, and care plan.   Telephone follow up appointment with care management team member scheduled for: 05/16/2021 at 10:15 AM

## 2021-03-31 ENCOUNTER — Telehealth: Payer: Self-pay

## 2021-04-04 ENCOUNTER — Telehealth: Payer: Self-pay | Admitting: Internal Medicine

## 2021-04-04 ENCOUNTER — Other Ambulatory Visit: Payer: Self-pay | Admitting: Internal Medicine

## 2021-04-04 ENCOUNTER — Other Ambulatory Visit: Payer: Self-pay | Admitting: Family Medicine

## 2021-04-04 DIAGNOSIS — G4700 Insomnia, unspecified: Secondary | ICD-10-CM

## 2021-04-04 DIAGNOSIS — E1165 Type 2 diabetes mellitus with hyperglycemia: Secondary | ICD-10-CM

## 2021-04-04 DIAGNOSIS — IMO0002 Reserved for concepts with insufficient information to code with codable children: Secondary | ICD-10-CM

## 2021-04-04 DIAGNOSIS — E785 Hyperlipidemia, unspecified: Secondary | ICD-10-CM

## 2021-04-04 NOTE — Telephone Encounter (Signed)
Requested medication (s) are due for refill today: see encounter   Requested medication (s) are on the active medication list: yes  Last refill:  03/25/21 #90 0 refills  Future visit scheduled: yes in 4 months   Notes to clinic:  signed 03/25/21. Do you want to resend ?     Requested Prescriptions  Pending Prescriptions Disp Refills   traZODone (DESYREL) 50 MG tablet 90 tablet 0    Sig: Take 0.5-1 tablets (25-50 mg total) by mouth at bedtime as needed for sleep.      Psychiatry: Antidepressants - Serotonin Modulator Passed - 04/04/2021  4:18 PM      Passed - Completed PHQ-2 or PHQ-9 in the last 360 days      Passed - Valid encounter within last 6 months    Recent Outpatient Visits           1 month ago Closed fracture of multiple ribs of left side, initial encounter   [facility] [place], Coralie Keens, NP   2 months ago Left hand pain   [name] B[place], [place] W, NP   3 months ago Uncontrolled type 2 diabetes mellitus with insulin therapy [facility])   [facility] Kathrine Haddock, NP   6 months ago Controlled type 2 diabetes mellitus with complication, with long-term current use of insulin [facility])   [place], FNP   9 months ago Insomnia, unspecified type   [place], FNP       Future Appointments             In 4 months [name], Coralie Keens, NP [facility], [facility]

## 2021-04-04 NOTE — Telephone Encounter (Signed)
Pt called and reported that she is missing sleeping medication, but she does not know the name of it.  [place], [place] [address]  Phone: [phone] Fax: [phone]

## 2021-04-04 NOTE — Telephone Encounter (Signed)
[name] will get script for Trazodone fill from 03/25/2021

## 2021-04-04 NOTE — Telephone Encounter (Signed)
Copied from [place] [id number]. Topic: Quick Communication - Rx Refill/Question >> Apr 04, 2021  4:09 PM Tessa Lerner A wrote: Medication: traZODone (DESYREL) 50 MG tablet   Has the patient contacted their pharmacy? Yes.   (Agent: If no, request that the patient contact the pharmacy for the refill.) (Agent: If yes, when and what did the pharmacy advise?)  Preferred Pharmacy (with phone number or street name): [name] - [place], [place].  Phone:  [phone] Fax:  [phone]  Agent: Please be advised that RX refills may take up to 3 business days. We ask that you follow-up with your pharmacy.

## 2021-04-05 MED ORDER — TRAZODONE HCL 50 MG PO TABS: 25.0000 mg | ORAL_TABLET | Freq: Every evening | ORAL | 0 refills | Status: DC | PRN

## 2021-04-06 ENCOUNTER — Ambulatory Visit: Payer: Self-pay | Admitting: Pharmacist

## 2021-04-06 DIAGNOSIS — E118 Type 2 diabetes mellitus with unspecified complications: Secondary | ICD-10-CM

## 2021-04-06 DIAGNOSIS — F5101 Primary insomnia: Secondary | ICD-10-CM

## 2021-04-06 DIAGNOSIS — R269 Unspecified abnormalities of gait and mobility: Secondary | ICD-10-CM

## 2021-04-06 NOTE — Patient Instructions (Signed)
Visit Information  PATIENT GOALS:  Goals Addressed             This Visit's Progress    Pharmacy - Patient Goals       -Please continue to keep log of home blood sugar results and have for [place] to review during our calls -Please continue to keep log of blood pressure results and have for [place] to review during our calls  Our goal bad cholesterol, or LDL, is less than 70 . This is why it is important to continue taking your atorvastatin.  I look forward to talking to you during our next telephone appointment. Please call if you need something sooner!  [name], PharmD, [place] [phone]         The patient verbalized understanding of instructions, educational materials, and care plan provided today and declined offer to receive copy of patient instructions, educational materials, and care plan.   Telephone follow up appointment with care management team member scheduled for: 05/16/2021 at 10:15 AM

## 2021-04-06 NOTE — Chronic Care Management (AMB) (Signed)
Chronic Care Management Pharmacy Note  04/06/2021 Name:  Kendra [name] MRN:  [id number] DOB:  November 23, 1945   Subjective: Kendra [name] is an 75 y.o. year old female who is a primary patient of [name], NP.  The CCM team was consulted for assistance with disease management and care coordination needs.    Engaged with patient by telephone for follow up visit in response to provider referral for pharmacy case management and/or care coordination services.   Consent to Services:  The patient was given information about Chronic Care Management services, agreed to services, and gave verbal consent prior to initiation of services.  Please see initial visit note for detailed documentation.   Patient Care Team: [name], NP as PCP - General (Internal Medicine) Alba Cory, MD as Attending Physician (Family Medicine) Kieth Brightly, MD (General Surgery) Rudra Hobbins, Jackelyn Poling, RPH-CPP as Pharmacist Arlana Pouch Megan Mans, RN as Case Manager (General Practice) Malfi, Jodelle Gross, FNP as Nurse Practitioner (Family Medicine) Bridgett Larsson, LCSW as Social Worker (Licensed Clinical Social Worker)  Recent office visits: None  Hospital visits: ED Visit on 5/20 for rib fractures after a fall  Objective:  Lab Results  Component Value Date   CREATININE 0.82 02/01/2021   CREATININE 0.82 12/21/2020   CREATININE 0.66 07/20/2020    Lab Results  Component Value Date   HGBA1C 5.8 (A) 12/21/2020   Last diabetic Eye exam: No results found for: HMDIABEYEEXA  Last diabetic Foot exam: No results found for: HMDIABFOOTEX      Component Value Date/Time   CHOL 92 12/21/2020 1134   CHOL 177 06/07/2015 1215   CHOL 141 09/02/2014 0440   TRIG 70 12/21/2020 1134   TRIG 155 09/02/2014 0440   HDL 51 12/21/2020 1134   HDL 56 06/07/2015 1215   HDL 37 (L) 09/02/2014 0440   CHOLHDL 1.8 12/21/2020 1134   VLDL 16 10/17/2019 0606   VLDL 31 09/02/2014 0440   LDLCALC 26 12/21/2020 1134    LDLCALC 73 09/02/2014 0440     Social History   Tobacco Use  Smoking Status Some Days   Packs/day: 0.10   Years: 61.00   Pack years: 6.10   Types: Cigarettes  Smokeless Tobacco Former   Types: Snuff  Tobacco Comments   .5 of 1 cigarette a day   BP Readings from Last 3 Encounters:  02/22/21 129/73  02/08/21 133/83  02/04/21 124/72   Pulse Readings from Last 3 Encounters:  02/22/21 81  02/08/21 96  02/04/21 81   Wt Readings from Last 3 Encounters:  02/22/21 124 lb 9.6 oz (56.5 kg)  02/08/21 122 lb 14.4 oz (55.7 kg)  02/04/21 125 lb (56.7 kg)    Assessment: Review of patient past medical history, allergies, medications, health status, including review of consultants reports, laboratory and other test data, was performed as part of comprehensive evaluation and provision of chronic care management services.   SDOH:  (Social Determinants of Health) assessments and interventions performed: none   CCM Care Plan  Allergies  Allergen Reactions   Citalopram Nausea And Vomiting    Medications Reviewed Today     Reviewed by Daphane Shepherd, RPH-CPP (Pharmacist) on 03/30/21 at 1607  Med List Status: <None>   Medication Order Taking? Sig Documenting Provider Last Dose Status Informant  Acetaminophen (TYLENOL 8 HOUR ARTHRITIS PAIN PO) [id number] Yes Take 2 capsules by mouth in the morning, at noon, in the evening, and at bedtime. [provider] Taking  Active   albuterol (VENTOLIN HFA) 108 (90 Base) MCG/ACT inhaler [id number] Yes Inhale 1-2 Puffs every 6 hours as needed Shortness of Breath and wheeze Olin Hauser, DO Taking Active   aspirin 325 MG tablet [id number] Yes Take 325 mg by mouth daily. [provider] Taking Active   atorvastatin (LIPITOR) 40 MG tablet [id number] Yes TAKE 1 TABLET BY MOUTH BEDTIME Olin Hauser, DO Taking Active   Blood Glucose Monitoring Suppl [facility] VERIO) w/Device KIT [id number]  USE AS DIRECTED [name], FNP  Active   buPROPion (WELLBUTRIN XL) 150 MG 24 hr tablet [id number] Yes TAKE 1 TABLET BY MOUTH ONCE DAILY [name], Coralie Keens, NP Taking Active   clopidogrel (PLAVIX) 75 MG tablet [id number] Yes Take 1 tablet by mouth daily. [provider] Taking Active   docusate sodium (COLACE) 100 MG capsule [id number] Yes Take 1 capsule (100 mg total) by mouth 2 (two) times daily as needed for mild constipation. [name], NP Taking Active   Fluticasone-Umeclidin-Vilant (TRELEGY ELLIPTA) 100-62.5-25 MCG/INH AEPB [id number] Yes USE ONE INHALATION INTO THE LUNGS ONCE A DAY RINSE MOUTH AFTER EACH USE Olin Hauser, DO Taking Active   glucose blood (ONETOUCH VERIO) test strip [id number]  Use as instructed Olin Hauser, DO  Active   Lancet Devices (ONE TOUCH DELICA LANCING DEV) MISC [id number]  1 Device by Does not apply route 2 (two) times daily. [name], FNP  Active   OneTouch Delica Lancets 27C MISC [id number]  1 Device by Does not apply route 2 (two) times daily. Olin Hauser, DO  Active   traZODone (DESYREL) 50 MG tablet [id number] Yes Take 0.5-1 tablets (25-50 mg total) by mouth at bedtime as needed for sleep. [name], NP Taking Active             Patient Active Problem List   Diagnosis Date Noted   Hemiparesis affecting left side as late effect of stroke ([place]) 02/02/2021   Recurrent major depressive disorder, in partial remission ([place]) 02/02/2021   Carotid artery stenosis 12/30/2019   Hypertension associated with diabetes ([place]) 12/19/2018   Diabetes mellitus type 2, controlled, with complications ([place]) 71/02/2693   Insomnia 03/30/2015   Hyperlipidemia associated with type 2 diabetes mellitus ([place]) 03/30/2015   Gastro-esophageal reflux disease without esophagitis 03/30/2015   History of cerebrovascular accident with residual deficit 03/30/2015   Osteoarthritis 03/30/2015   Osteopenia 03/30/2015   B12 deficiency 03/30/2015     Immunization History  Administered Date(s) Administered   Fluad Quad(high Dose 65+) 05/21/2019   Influenza Split 06/20/2012   Influenza, High Dose Seasonal PF 07/26/2016, 06/14/2017, 07/31/2018   Influenza, Seasonal, Injecte, Preservative Fre 08/04/2010   Influenza,inj,Quad PF,6+ Mos 06/10/2013, 05/26/2014, 06/07/2015   Influenza-Unspecified 05/26/2014, 06/18/2018   PFIZER(Purple Top)SARS-COV-2 Vaccination 03/02/2020, 03/30/2020   Pneumococcal Conjugate-13 10/31/2007, 10/21/2014   Pneumococcal Polysaccharide-23 10/31/2007, 06/20/2012, 07/31/2018   Tdap 08/04/2010   Zoster, Live 06/10/2013    Conditions to be addressed/monitored: HTN, HLD, COPD, and DMII  Care Plan : PharmD - Medication Mgmt  Updates made by Vella Raring, RPH-CPP since 04/06/2021 12:00 AM     Problem: Disease Progression      Long-Range Goal: Disease Progression Prevented or Minimized   Start Date: 11/01/2020  Expected End Date: 01/30/2021  This Visit's Progress: On track  Recent Progress: On track  Priority: High  Note:   Current Barriers:  Financial Barriers Limited social support  Pharmacist Clinical Goal(s):  Over the next 90 days,  patient will maintain adherence to monitoring guidelines and medication adherence to achieve therapeutic efficacy through collaboration with PharmD and provider.   Interventions: 1:1 collaboration with Webb Silversmith, NP regarding development and update of comprehensive plan of care as evidenced by provider attestation and co-signature Inter-disciplinary care team collaboration (see longitudinal plan of care)  Medication Management: Reports continues to take medication from pill pack as packaged by [name] Patient requests assistance as states has been unable to obtain her trazodone Rx from the pharmacy From review of chart, note PCP sent Rx for trazodone to [name] on 7/19 Place call to [name] with patient on the line. Speak with pharmacist who  confirms trazodone Rx ready for patient and can be delivered this afternoon. Identify pharmacy has been unable to reach patient due to not having current phone number. New phone number provided to pharmacy Patient reports will try taking trazodone 1/2 tablet (25 mg), rather than a full tablet, at bedtime as needed to see if reduces dizziness, as previously discussed  Coordination of care: Reports has been unable to follow up with Care Guide regarding obtaining a cane as misplaced her number. Provide patient with phone number for Care Guide again today Provide patient with phone number for CCM Social Worker as requested. Reports wants to talk to discuss finding a new place to live that doesn't have steps  Patient Goals/Self-Care Activities Over the next 90 days, patient will:  - take medications as prescribed  Using pill packaging from [facility] - check glucose, document, and provide at future appointments - check blood pressure, document, and provide at future appointments  Follow Up Plan: Telephone follow up appointment with care management team member scheduled for: 05/16/2021 at 10:15 AM       Patient's preferred pharmacy is:  [facility], [address] [place] [address] Phone: [phone] Fax: [phone]  [id number] - [place], [address] [place] [address] Phone: [phone] Fax: [phone]  [facility] - [place], [place] [address] Phone: [phone] Fax: [phone]  Follow Up:  Patient agrees to Care Plan and Follow-up.  Wallace Cullens, PharmD, Para [place], CPP Clinical Pharmacist [facility] [phone]

## 2021-04-11 ENCOUNTER — Inpatient Hospital Stay: Payer: Medicare Other | Attending: Oncology

## 2021-04-11 DIAGNOSIS — Z79899 Other long term (current) drug therapy: Secondary | ICD-10-CM | POA: Insufficient documentation

## 2021-04-11 DIAGNOSIS — D649 Anemia, unspecified: Secondary | ICD-10-CM | POA: Diagnosis not present

## 2021-04-11 DIAGNOSIS — F17210 Nicotine dependence, cigarettes, uncomplicated: Secondary | ICD-10-CM | POA: Diagnosis not present

## 2021-04-11 DIAGNOSIS — E538 Deficiency of other specified B group vitamins: Secondary | ICD-10-CM | POA: Insufficient documentation

## 2021-04-11 MED ORDER — CYANOCOBALAMIN 1000 MCG/ML IJ SOLN
1000.0000 ug | Freq: Once | INTRAMUSCULAR | Status: AC
Start: 2021-04-11 — End: 2021-04-11
  Administered 2021-04-11: 1000 ug via INTRAMUSCULAR
  Filled 2021-04-11: qty 1

## 2021-04-12 ENCOUNTER — Ambulatory Visit: Payer: Self-pay | Admitting: Licensed Clinical Social Worker

## 2021-04-12 ENCOUNTER — Other Ambulatory Visit: Payer: Self-pay | Admitting: Family Medicine

## 2021-04-12 DIAGNOSIS — E118 Type 2 diabetes mellitus with unspecified complications: Secondary | ICD-10-CM

## 2021-04-12 DIAGNOSIS — F3341 Major depressive disorder, recurrent, in partial remission: Secondary | ICD-10-CM

## 2021-04-12 DIAGNOSIS — R269 Unspecified abnormalities of gait and mobility: Secondary | ICD-10-CM

## 2021-04-12 DIAGNOSIS — J432 Centrilobular emphysema: Secondary | ICD-10-CM

## 2021-04-12 DIAGNOSIS — J449 Chronic obstructive pulmonary disease, unspecified: Secondary | ICD-10-CM

## 2021-04-12 DIAGNOSIS — I152 Hypertension secondary to endocrine disorders: Secondary | ICD-10-CM

## 2021-04-12 DIAGNOSIS — E1159 Type 2 diabetes mellitus with other circulatory complications: Secondary | ICD-10-CM

## 2021-04-12 DIAGNOSIS — R296 Repeated falls: Secondary | ICD-10-CM

## 2021-04-12 NOTE — Telephone Encounter (Signed)
  Notes to clinic:  Patient has appointment on 04/14/2021 Requesting 90 day supply    Requested Prescriptions  Pending Prescriptions Disp [place] 100-62.5-25 MCG/INH AEPB [Pharmacy Med Name: TRELEGY ELLIPTA 100-62.5-25 MCG/INH] 60 each 1    Sig: USE ONE INHALATION INTO THE LUNGS ONCE A DAY RINSE MOUTH AFTER EACH USE      Off-Protocol Failed - 04/12/2021 11:14 AM      Failed - Medication not assigned to a protocol, review manually.      Passed - Valid encounter within last 12 months    Recent Outpatient Visits           1 month ago Closed fracture of multiple ribs of left side, initial encounter   [facility], Coralie Keens, NP   2 months ago Left hand pain   [facility] [place], [place] W, NP   3 months ago Uncontrolled type 2 diabetes mellitus with insulin therapy [name])   [facility] Kathrine Haddock, NP   6 months ago Controlled type 2 diabetes mellitus with complication, with long-term current use of insulin ([place])   [facility], [name], FNP   9 months ago Insomnia, unspecified type   [place], FNP       Future Appointments             In 3 months [name], Coralie Keens, NP [facility], [facility]               albuterol (VENTOLIN HFA) 108 (90 Base) MCG/ACT inhaler [Pharmacy Med Name: ALBUTEROL SULFATE HFA 108 (90 BASE)] 8.5 g 1    Sig: INHALE 1-2 PUFFS BY MOUTH EVERY 6 HOURS AS NEEDED WHEEZING/ SHORTNESS OF BREATH      Pulmonology:  Beta Agonists Failed - 04/12/2021 11:14 AM      Failed - One inhaler should last at least one month. If the patient is requesting refills earlier, contact the patient to check for uncontrolled symptoms.      Passed - Valid encounter within last 12 months    Recent Outpatient Visits           1 month ago Closed fracture of multiple ribs of left side, initial encounter   [facility] [place], Coralie Keens,  NP   2 months ago Left hand pain   [facility] [place], [place] W, NP   3 months ago Uncontrolled type 2 diabetes mellitus with insulin therapy [name])   [facility] Kathrine Haddock, NP   6 months ago Controlled type 2 diabetes mellitus with complication, with long-term current use of insulin [name])   [place], FNP   9 months ago Insomnia, unspecified type   [name], [name], FNP       Future Appointments             In 3 months [name], Coralie Keens, NP [facility]

## 2021-04-13 NOTE — Chronic Care Management (AMB) (Signed)
Chronic Care Management    Clinical Social Work Note  04/13/2021 Name: Kendra [name] MRN: [id number] DOB: 1945-10-23  Kendra [name] is a 75 y.o. year old female who is a primary care patient of [name], NP. The CCM team was consulted to assist the patient with chronic disease management and/or care coordination needs related to: Level of Care Concerns and Mental Health Counseling and Resources.   Engaged with patient by telephone for follow up visit in response to provider referral for social work chronic care management and care coordination services.   Consent to Services:  The patient was given information about Chronic Care Management services, agreed to services, and gave verbal consent prior to initiation of services.  Please see initial visit note for detailed documentation.   Patient agreed to services and consent obtained.   Assessment: Patient is engaged in conversation, continues to maintain positive progress with care plan goals. She reports limited mobility due to weakness in left leg. Patient is requesting a prescription for a cane to promote safety. Strategies regarding stress management were discussed. See Care Plan below for interventions and patient self-care actives.  Recent life changes Kendra [name]: Limited mobility  Recommendation: Patient may benefit from, and is in agreement to work with LCSW to address care coordination needs and will continue to work with the clinical team to address health care and disease management related needs.   Follow up Plan: Patient would like continued follow-up from CCM LCSW .  Follow up scheduled in 04/26/21. Patient will call office if needed prior to next encounter.    SDOH (Social Determinants of Health) assessments and interventions performed:    Advanced Directives Status: Not addressed in this encounter.  CCM Care Plan  Allergies  Allergen Reactions   Citalopram Nausea And Vomiting    Outpatient Encounter  Medications as of 04/12/2021  Medication Sig   Acetaminophen (TYLENOL 8 HOUR ARTHRITIS PAIN PO) Take 2 capsules by mouth in the morning, at noon, in the evening, and at bedtime.   albuterol (VENTOLIN HFA) 108 (90 Base) MCG/ACT inhaler INHALE 1-2 PUFFS BY MOUTH EVERY 6 HOURS AS NEEDED WHEEZING/ SHORTNESS OF BREATH   aspirin 325 MG tablet Take 325 mg by mouth daily.   atorvastatin (LIPITOR) 40 MG tablet TAKE 1 TABLET BY MOUTH BEDTIME   Blood Glucose Monitoring Suppl (ONETOUCH VERIO) w/Device KIT USE AS DIRECTED   buPROPion (WELLBUTRIN XL) 150 MG 24 hr tablet TAKE 1 TABLET BY MOUTH ONCE DAILY   clopidogrel (PLAVIX) 75 MG tablet Take 1 tablet by mouth daily.   docusate sodium (COLACE) 100 MG capsule Take 1 capsule (100 mg total) by mouth 2 (two) times daily as needed for mild constipation.   glucose blood (ONETOUCH VERIO) test strip Use as instructed   Lancet Devices (ONE TOUCH DELICA LANCING DEV) MISC 1 Device by Does not apply route 2 (two) times daily.   OneTouch Delica Lancets 37S MISC 1 Device by Does not apply route 2 (two) times daily.   traZODone (DESYREL) 50 MG tablet Take 0.5-1 tablets (25-50 mg total) by mouth at bedtime as needed for sleep.   TRELEGY ELLIPTA 100-62.5-25 MCG/INH AEPB USE ONE INHALATION INTO THE LUNGS ONCE A DAY RINSE MOUTH AFTER EACH USE   No facility-administered encounter medications on file as of 04/12/2021.    Patient Active Problem List   Diagnosis Date Noted   Hemiparesis affecting left side as late effect of stroke ([place]) 02/02/2021   Recurrent major depressive disorder, in partial  remission ([place]) 02/02/2021   Carotid artery stenosis 12/30/2019   Hypertension associated with diabetes ([place]) 12/19/2018   Diabetes mellitus type 2, controlled, with complications ([place]) 21/19/4174   Insomnia 03/30/2015   Hyperlipidemia associated with type 2 diabetes mellitus ([place]) 03/30/2015   Gastro-esophageal reflux disease without esophagitis 03/30/2015   History of  cerebrovascular accident with residual deficit 03/30/2015   Osteoarthritis 03/30/2015   Osteopenia 03/30/2015   B12 deficiency 03/30/2015    Conditions to be addressed/monitored: Depression; Level of care concerns and Mental Health Concerns   Care Plan : General Social Work (Adult)  Updates made by [name], LCSW since 04/13/2021 12:00 AM     Problem: Coping Skills (General Plan of Care)      Long-Range Goal: Coping Skills Enhanced   Start Date: 12/14/2020  This Visit's Progress: On track  Recent Progress: On track  Priority: Medium  Note:   Timeframe:  Long-Range Goal Priority:  Medium Start Date:   12/14/20                    Expected End Date: 05/18/21                Follow Up Date -04/26/21  Current Barriers:  Financial constraints Limited social support ADL IADL limitations Mental Health Concerns  Social Isolation Limited access to caregiver Inability to perform ADL's independently Inability to perform IADL's independently Lacks knowledge of community resource: grief support resources within the area  Clinical Social Work Clinical Goal(s):  Over the next 120 days, patient will work with SW to address concerns related to gaining additional support within the home and resource connection in order to maintain health and independency within the community  Over the next 120 days, patient will demonstrate improved adherence to self care as evidenced by implementing healthy self-care into her daily routine such as: attending all medical appointments, taking time for self-reflection, taking medications as prescribed, drinking water and daily exercise to improve mobility.  Interventions: Patient interviewed and appropriate assessments performed CCM LCSW inquired about patient's depression symptoms. Patient reports that her symptoms have been managed well despite the ongoing pain. CCM LCSW provided validation and support. Patient was encouraged to continue utilizing  strategies discussed with provider to assist with pain relief.  Patient is requesting a prescription for a cane. States she continues to experience weakness in her left leg and has difficulty walking Patient reports that she had a fall two months ago while dusting in her home. Per patient, she sustained two knots on her head and broke a bone in her nose requiring her to receive treatment at the ED.  Patient has a walker but doesn't like using it because it's too big and tips her over. She utilizes her rollator "sometimes" Patient feels like she'll be able to walk more efficiently with a cane Patient reports that she has received the lift and is appreciative for the assistance States she is still in need of a mattress. Care guide informed her that they will keep looking for donations.  Patient reports that she becomes stressed when tenants in her building argue with one another in the parking lot. CCM LCSW provided validation to her feelings and encouragement. Strategies to promote safety were discussed Reports compliance with medications and management of depression symptoms Discussed plans with patient for ongoing care management follow up and provided patient with direct contact information for care management team Emotional Support provided due to ongoing health complications.  1:1 collaboration with PCP  regarding development and update of comprehensive plan of care as evidenced by provider attestation and co-signature  Discussed plans with patient for ongoing care management follow up and provided patient with direct contact information for care management team Advised patient to contact CCM providers if she has any concerns in regards to her safety, housing or well-being. Patient reports being currently stable.   Patient Self Care Activities:  Attend all scheduled provider appointments Call provider office for new concerns or questions Utilize healthy coping skills discussed to assist with pain  and depression management Continue to comply with med management         Christa See, MSW, [email or web address] Phone [phone] 10:10 AM

## 2021-04-13 NOTE — Patient Instructions (Signed)
Visit Information   Goals Addressed               This Visit's Progress     Patient Stated     "I probably could benefit from more support." (pt-stated)   On track     Patient Self Care Activities:  Attend all scheduled provider appointments Call provider office for new concerns or questions Utilize healthy coping skills discussed to assist with pain and depression management Continue to comply with med management      SW-I probably could use some extra support right now. (pt-stated)   On track     Patient Self Care Activities:  Attend all scheduled provider appointments Call provider office for new concerns or questions Utilize healthy coping skills discussed to assist with pain and depression management Continue to comply with med management        Patient verbalizes understanding of instructions provided today.   Telephone follow up appointment with care management team member scheduled for:04/26/21  Christa See, MSW, [email or web address] Phone [phone] 10:12 AM

## 2021-04-14 ENCOUNTER — Telehealth: Payer: Self-pay | Admitting: General Practice

## 2021-04-14 ENCOUNTER — Ambulatory Visit: Payer: Self-pay | Admitting: General Practice

## 2021-04-14 DIAGNOSIS — E1169 Type 2 diabetes mellitus with other specified complication: Secondary | ICD-10-CM

## 2021-04-14 DIAGNOSIS — E1159 Type 2 diabetes mellitus with other circulatory complications: Secondary | ICD-10-CM

## 2021-04-14 DIAGNOSIS — E118 Type 2 diabetes mellitus with unspecified complications: Secondary | ICD-10-CM | POA: Diagnosis not present

## 2021-04-14 DIAGNOSIS — I152 Hypertension secondary to endocrine disorders: Secondary | ICD-10-CM

## 2021-04-14 DIAGNOSIS — F3341 Major depressive disorder, recurrent, in partial remission: Secondary | ICD-10-CM

## 2021-04-14 DIAGNOSIS — R269 Unspecified abnormalities of gait and mobility: Secondary | ICD-10-CM

## 2021-04-14 DIAGNOSIS — E785 Hyperlipidemia, unspecified: Secondary | ICD-10-CM

## 2021-04-14 DIAGNOSIS — J432 Centrilobular emphysema: Secondary | ICD-10-CM

## 2021-04-14 DIAGNOSIS — R296 Repeated falls: Secondary | ICD-10-CM

## 2021-04-14 NOTE — Progress Notes (Signed)
RX for cane placed at front desk for pickup

## 2021-04-14 NOTE — Chronic Care Management (AMB) (Signed)
Chronic Care Management   CCM RN Visit Note  04/14/2021 Name: Kendra [name] MRN: [id number] DOB: 1945/09/23  Subjective: Kendra [name] is a 75 y.o. year old female who is a primary care patient of [name], NP. The care management team was consulted for assistance with disease management and care coordination needs.    Engaged with patient by telephone for follow up visit in response to provider referral for case management and/or care coordination services.   Consent to Services:  The patient was given information about Chronic Care Management services, agreed to services, and gave verbal consent prior to initiation of services.  Please see initial visit note for detailed documentation.   Patient agreed to services and verbal consent obtained.   Assessment: Review of patient past medical history, allergies, medications, health status, including review of consultants reports, laboratory and other test data, was performed as part of comprehensive evaluation and provision of chronic care management services.   SDOH (Social Determinants of Health) assessments and interventions performed:  SDOH Interventions    Flowsheet Row Most Recent Value  SDOH Interventions   Physical Activity Interventions Other (Comments)  [High fall risk. The patient fell last week when going outside and it was raining.]        CCM Care Plan  Allergies  Allergen Reactions   Citalopram Nausea And Vomiting    Outpatient Encounter Medications as of 04/14/2021  Medication Sig   Acetaminophen (TYLENOL 8 HOUR ARTHRITIS PAIN PO) Take 2 capsules by mouth in the morning, at noon, in the evening, and at bedtime.   albuterol (VENTOLIN HFA) 108 (90 Base) MCG/ACT inhaler INHALE 1-2 PUFFS BY MOUTH EVERY 6 HOURS AS NEEDED WHEEZING/ SHORTNESS OF BREATH   aspirin 325 MG tablet Take 325 mg by mouth daily.   atorvastatin (LIPITOR) 40 MG tablet TAKE 1 TABLET BY MOUTH BEDTIME   Blood Glucose Monitoring Suppl  (ONETOUCH VERIO) w/Device KIT USE AS DIRECTED   buPROPion (WELLBUTRIN XL) 150 MG 24 hr tablet TAKE 1 TABLET BY MOUTH ONCE DAILY   clopidogrel (PLAVIX) 75 MG tablet Take 1 tablet by mouth daily.   docusate sodium (COLACE) 100 MG capsule Take 1 capsule (100 mg total) by mouth 2 (two) times daily as needed for mild constipation.   glucose blood (ONETOUCH VERIO) test strip Use as instructed   Lancet Devices (ONE TOUCH DELICA LANCING DEV) MISC 1 Device by Does not apply route 2 (two) times daily.   OneTouch Delica Lancets 58X MISC 1 Device by Does not apply route 2 (two) times daily.   traZODone (DESYREL) 50 MG tablet Take 0.5-1 tablets (25-50 mg total) by mouth at bedtime as needed for sleep.   TRELEGY ELLIPTA 100-62.5-25 MCG/INH AEPB USE ONE INHALATION INTO THE LUNGS ONCE A DAY RINSE MOUTH AFTER EACH USE   No facility-administered encounter medications on file as of 04/14/2021.    Patient Active Problem List   Diagnosis Date Noted   Hemiparesis affecting left side as late effect of stroke ([place]) 02/02/2021   Recurrent major depressive disorder, in partial remission ([place]) 02/02/2021   Carotid artery stenosis 12/30/2019   Hypertension associated with diabetes ([place]) 12/19/2018   Diabetes mellitus type 2, controlled, with complications ([place]) 09/40/7680   Insomnia 03/30/2015   Hyperlipidemia associated with type 2 diabetes mellitus ([place]) 03/30/2015   Gastro-esophageal reflux disease without esophagitis 03/30/2015   History of cerebrovascular accident with residual deficit 03/30/2015   Osteoarthritis 03/30/2015   Osteopenia 03/30/2015   B12 deficiency 03/30/2015  Conditions to be addressed/monitored:HTN, HLD, and High fall risk- Falls and safety   Care Plan : RNCM: Hypertension (Adult)  Updates made by Vanita Ingles since 04/14/2021 12:00 AM     Problem: RNCM: Hypertension (Hypertension)   Priority: Medium     Long-Range Goal: RNCM: Hypertension Monitored   Start Date: 01/24/2021   Expected End Date: 02/28/2022  This Visit's Progress: On track  Priority: Medium  Note:   Objective:  Last practice recorded BP readings:  BP Readings from Last 3 Encounters:  02/22/21 129/73  02/08/21 133/83  02/04/21 124/72   Most recent eGFR/CrCl: No results found for: EGFR  No components found for: CRCL Current Barriers:  Knowledge Deficits related to basic understanding of hypertension pathophysiology and self care management Knowledge Deficits related to understanding of medications prescribed for management of hypertension Transportation barriers Cognitive Deficits Limited Social Designer, multimedia.  Unable to independently HTN Lacks social connections Unable to perform IADLs independently Does not contact provider office for questions/concerns Case Manager Clinical Goal(s):  patient will verbalize understanding of plan for hypertension management patient will attend all scheduled medical appointments: 08-08-2021 at 10 am patient will demonstrate improved adherence to prescribed treatment plan for hypertension as evidenced by taking all medications as prescribed, monitoring and recording blood pressure as directed, adhering to low sodium/DASH diet patient will demonstrate improved health management independence as evidenced by checking blood pressure as directed and notifying PCP if SBP>160 or DBP > 90, taking all medications as prescribe, and adhering to a low sodium diet as discussed. patient will verbalize basic understanding of hypertension disease process and self health management plan as evidenced by compliance with medications, compliance with heart healthy/ADA diet and working with the CCM team to optimize health and well being  Interventions:  Collaboration with [name], NP regarding development and update of comprehensive plan of care as evidenced by provider attestation and co-signature Inter-disciplinary care team collaboration (see longitudinal  plan of care) UNABLE to independently:manage HTN Evaluation of current treatment plan related to hypertension self management and patient's adherence to plan as established by provider. 04-14-2021: The patient just woke up at the time of the call. The patient states that her pressures are doing good but she did not have her log near her and was still in the bed. Denies any issues with taking medications and is eating well. The patient states she has actually gained weight. Encouraged the patient to continue checking blood pressures on a consistent bases.  Provided education to patient re: stroke prevention, s/s of heart attack and stroke, DASH diet, complications of uncontrolled blood pressure Reviewed medications with patient and discussed importance of compliance Discussed plans with patient for ongoing care management follow up and provided patient with direct contact information for care management team Advised patient, providing education and rationale, to monitor blood pressure daily and record, calling PCP for findings outside established parameters.  Reviewed scheduled/upcoming provider appointments including: 08-08-2021 at 10 am Self-Care Activities: - Self administers medications as prescribed Attends all scheduled provider appointments Calls provider office for new concerns, questions, or BP outside discussed parameters Checks BP and records as discussed Follows a low sodium diet/DASH diet Patient Goals: - check blood pressure daily - choose a place to take my blood pressure (home, clinic or office, retail store) - write blood pressure results in a log or diary - agree on reward when goals are met - agree to work together to make changes - ask questions to understand - have  a family meeting to talk about healthy habits - learn about high blood pressure  Follow Up Plan: Telephone follow up appointment with care management team member scheduled for: 06-09-2021 at 1145 am    Task:  RNCM: Identify and Monitor Blood Pressure Elevation Completed 04/14/2021  Outcome: Positive  Note:   Care Management Activities:    - blood pressure equipment and technique reviewed - blood pressure trends reviewed - depression screen reviewed - home or ambulatory blood pressure monitoring encouraged        Care Plan : RNCM: HLD Management  Updates made by Vanita Ingles since 04/14/2021 12:00 AM     Problem: RNCM: Health Promotion or Disease Self-Management (General Plan of Care)   Priority: Medium     Long-Range Goal: RNCM: HLD Self-Management Plan Developed   Start Date: 01/24/2021  Expected End Date: 02/28/2022  This Visit's Progress: On track  Priority: Medium  Note:   Current Barriers:  Poorly controlled hyperlipidemia, complicated by HTN, smoker, COPd Current antihyperlipidemic regimen: Lipitor $RemoveBeforeDE'40mg'[id number]$  daily Most recent lipid panel:     Component Value Date/Time   CHOL 92 12/21/2020 1134   CHOL 177 06/07/2015 1215   CHOL 141 09/02/2014 0440   TRIG 70 12/21/2020 1134   TRIG 155 09/02/2014 0440   HDL 51 12/21/2020 1134   HDL 56 06/07/2015 1215   HDL 37 (L) 09/02/2014 0440   CHOLHDL 1.8 12/21/2020 1134   VLDL 16 10/17/2019 0606   VLDL 31 09/02/2014 0440   LDLCALC 26 12/21/2020 1134   LDLCALC 73 09/02/2014 0440   ASCVD risk enhancing conditions: age >2, DM, HTN, current smoker Unable to independently HLD Lacks social connections Unable to perform IADLs independently Does not contact provider office for questions/concerns RN Care Manager Clinical Goal(s):  patient will work with Consulting civil engineer, providers, and care team towards execution of optimized self-health management plan patient will verbalize understanding of plan for HLD patient will work with [facility], CCM team, and pcp to address needs related to HLD patient will take all medications exactly as prescribed and will call provider for medication related questions patient will attend all scheduled medical  appointments: 08-08-2021 at 10 am patient will demonstrate improved adherence to prescribed treatment plan for HLD patient will demonstrate improved health management independence patient will demonstrate understanding of rationale for each prescribed medication the patient will demonstrate ongoing self health care management ability Interventions: Collaboration with [name], NP regarding development and update of comprehensive plan of care as evidenced by provider attestation and co-signature Inter-disciplinary care team collaboration (see longitudinal plan of care) Medication review performed; medication list updated in electronic medical record.  Inter-disciplinary care team collaboration (see longitudinal plan of care) Referred to pharmacy team for assistance with HLD medication management Evaluation of current treatment plan related to HLD  and patient's adherence to plan as established by provider. Advised patient to call for changes in conditions, questions, or new concerns Reviewed medications with patient and discussed compliance. The patient states compliance with medications  Reviewed scheduled/upcoming provider appointments including: 08-08-2021 at 10 am Discussed plans with patient for ongoing care management follow up and provided patient with direct contact information for care management team Patient Goals/Self-Care Activities: - call for medicine refill 2 or 3 days before it runs out - call if I am sick and can't take my medicine - keep a list of all the medicines I take; vitamins and herbals too - learn to read medicine labels - use a pillbox to  sort medicine - use an alarm clock or phone to remind me to take my medicine - change to whole grain breads, cereal, pasta - drink 6 to 8 glasses of water each day - eat 3 to 5 servings of fruits and vegetables each day - eat 5 or 6 small meals each day - eat fish at least once per week - fill half the plate with  nonstarchy vegetables - join a weight loss program - limit fast food meals to no more than 1 per week - manage portion size - prepare main meal at home 3 to 5 days each week - read food labels for fat, fiber, carbohydrates and portion size - reduce red meat to 2 to 3 times a week - be open to making changes - I can manage, know and watch for signs of a heart attack - if I have chest pain, call for help - learn about small changes that will make a big difference - learn my personal risk factors  Follow Up Plan: Telephone follow up appointment with care management team member scheduled for: 06-09-2021 at 1145 am      Task: RNCM: Mutually Develop and Royce Macadamia Achievement of Patient Goals Completed 04/14/2021  Outcome: Positive  Note:   Care Management Activities:    - barriers to meeting goals identified - change-talk evoked - choices provided - collaboration with team encouraged - decision-making supported - health risks reviewed - problem-solving facilitated - questions answered - readiness for change evaluated - reassurance provided - resources needed to meet goals identified - self-reflection promoted - self-reliance encouraged        Care Plan : RNCM: Fall Risk (Adult)  Updates made by Vanita Ingles since 04/14/2021 12:00 AM     Problem: Fall Risk      Long-Range Goal: RNCM: Absence of Fall and Fall-Related Injury   Start Date: 04/14/2021  Expected End Date: 04/09/2022  This Visit's Progress: On track  Priority: High  Note:   Current Barriers:  Knowledge Deficits related to fall precautions in patient with  Decreased adherence to prescribed treatment for fall prevention Unable to independently manage falls and safety in patient with frequent falls, ER visit in May of2022 Lacks social connections Unable to perform IADLs independently Does not contact provider office for questions/concerns Frequent falls- last fall last week 04-04-2021 week Knowledge Deficits  related to falls prevention and safety in patient with multiple chronic conditions  Care Coordination needs related to the request for a cane  in a patient with frequent falls  Chronic Disease Management support and education needs related to frequent falls in a patient with high fall risk with most recent fall last week Lacks caregiver support.  Film/video editor.  Literacy barriers Transportation barriers Non-adherence to prescribed medication regimen High Fall risk patient  Clinical Goal(s):  patient will demonstrate improved adherence to prescribed treatment plan for decreasing falls as evidenced by patient reporting and review of EMR patient will verbalize using fall risk reduction strategies discussed patient will not experience additional falls patient will verbalize understanding of plan for fall prevention and safety patient will work with [facility], CCM team, and pcp  to address needs related to falls prevention and safety and the need for a cane to assist with ambulation patient will demonstrate a decrease in fall exacerbations patient will take all medications exactly as prescribed and will call provider for medication related questions patient will attend all scheduled medical appointments: 08-08-2021 at 10 am patient will demonstrate improved  adherence to prescribed treatment plan for falls prevention and safety  patient will demonstrate improved health management independence patient will verbalize basic understanding of multiple chronic conditions increasing risk of falls  disease process and self health management plan patient will demonstrate understanding of rationale for each prescribed medication the patient will demonstrate ongoing self health care management ability Interventions:  Collaboration with [name], NP regarding development and update of comprehensive plan of care as evidenced by provider attestation and co-signature Inter-disciplinary care team  collaboration (see longitudinal plan of care) Provided written and verbal education re: Potential causes of falls and Fall prevention strategies Reviewed medications and discussed potential side effects of medications such as dizziness and frequent urination Assessed for s/s of orthostatic hypotension. 04-14-2021: Denies any issues with orthostatic hypotension Assessed for falls since last encounter. 04-14-2021- the patient states that she had a fall last week when it was raining and she had on slippery shoes. She hit her arm, did not go to the ER. The patient had a fall in May with injury and broken ribs with ER visit.  Assessed patients knowledge of fall risk prevention secondary to previously provided education. Assessed working status of life alert bracelet and patient adherence Provided patient information for fall alert systems Evaluation of current treatment plan related to falls prevention and safety  and patient's adherence to plan as established by provider. Advised patient to call the office for new falls or injuries  Provided education to patient re: falls prevention and safety and working with the CCM team to optimize health and well being and prevent falls. The patient has a walker but states that it is "too big" and it causes her to trip up. The patient thinks a cane will be more helpful.  Collaborated with pcp regarding the patients request for a cane to use when ambulating  Provided patient with fall prevention  educational materials related to being a high fall risk and the need to be safe in the home and preventing injuries from falls.  Reviewed scheduled/upcoming provider appointments including: 08-08-2021 at 10 am Discussed plans with patient for ongoing care management follow up and provided patient with direct contact information for care management team Self-Care Deficits:  Unable to independently manage falls and safety in patient with multiple chronic conditions.  Lacks  social connections Unable to perform IADLs independently Does not contact provider office for questions/concerns Patient Goals:  - Utilize walker and will assist with obtaining a cane to use when ambulating  (assistive device) appropriately with all ambulation - De-clutter walkways - Change positions slowly - Wear secure fitting shoes at all times with ambulation - Utilize home lighting for dim lit areas - Demonstrate self and pet awareness at all times Follow Up Plan: Telephone follow up appointment with care management team member scheduled for: 06-09-2021 at 1145 am    Task: Identify and Manage Contributors to Fall Risk Completed 04/14/2021  Outcome: Positive  Note:   Care Management Activities:    - activities of daily living skills assessed - assistive or adaptive device use encouraged - barriers to physical activity or exercise addressed - barriers to physical activity or exercise identified - barriers to safety identified - cognition assessed - cognitive-stimulating activities promoted - fall prevention plan reviewed and updated - fear of falling, loss of independence and pain acknowledged - medication list reviewed - modification of home and work environment promoted - vision and/or hearing aid use promoted    Notes:  Plan:Telephone follow up appointment with care management team member scheduled for:  06-09-2021 at 1145 am  [place], MSN, [place] Mobile: [phone]

## 2021-04-14 NOTE — Telephone Encounter (Cosign Needed Addendum)
  Care Management   Follow Up Note   04/14/2021 Name: Kendra [name] MRN: [id number] DOB: 1946/08/18   Referred by: [name], NP Reason for referral : Chronic Care Management (RNCM: Call back to inform the patient about script for cane, unable to leave a message.) The patient called back and information provided.    The patient called the RNCM back after the [facility] was unable to reach. Information provided about script for cane ready for pick up. The patient states she has no way of coming to get and ask if the script could be mailed. Will reach out to front office staff and see if the script can be sent by mail to the patient so she can obtain requested cane.   Follow Up Plan: The care management team will reach out to the patient again over the next 30 to 60 days.   [name] RN, MSN, [place] Mobile: [phone]

## 2021-04-14 NOTE — Patient Instructions (Signed)
Visit Information  PATIENT GOALS:  Goals Addressed             This Visit's Progress    RNCM: Prevent Falls and Injury       Follow Up Date 06-09-2021   - add more outdoor lighting - always use handrails on the stairs - always wear low-heeled or flat shoes or slippers with nonskid soles - call the doctor if I am feeling too drowsy - install bathroom grab bars - keep a flashlight by the bed - keep my cell phone with me always - learn how to get back up if I fall - make an emergency alert plan in case I fall - pick up clutter from the floors - use a nonslip pad with throw rugs, or remove them completely - use a cane or walker - use a nightlight in the bathroom - wear my glasses and/or hearing aid    Why is this important?   Most falls happen when it is hard for you to walk safely. Your balance may be off because of an illness. You may have pain in your knees, hip or other joints.  You may be overly tired or taking medicines that make you sleepy. You may not be able to see or hear clearly.  Falls can lead to broken bones, bruises or other injuries.  There are things you can do to help prevent falling.     Notes: 04-14-2021: The patient states that she had a fall last week when it was raining and she went outside and her shoes were slippery. She hit her arm. Denies any acute distress. Education on safety and preventing falls. Will talk with the pcp about a cane script to be sent to [name]. She states the walker is too big and causes her more issues and she does not like to use. Will continue to monitor      RNCM: Track and Manage My Blood Pressure-Hypertension       Timeframe:  Long-Range Goal Priority:  Medium Start Date:       01-24-2021                      Expected End Date:   01-24-2022                    Follow Up Date 06-09-2021   - check blood pressure 3 times per week - check blood pressure daily - check blood pressure weekly - choose a place to take my blood  pressure (home, clinic or office, retail store) - write blood pressure results in a log or diary    Why is this important?   You won't feel high blood pressure, but it can still hurt your blood vessels.  High blood pressure can cause heart or kidney problems. It can also cause a stroke.  Making lifestyle changes like losing a little weight or eating less salt will help.  Checking your blood pressure at home and at different times of the day can help to control blood pressure.  If the doctor prescribes medicine remember to take it the way the doctor ordered.  Call the office if you cannot afford the medicine or if there are questions about it.     Notes: The patient gave the [name] several readings: [id number] systolic to [id number] diastolic. [id number]: The patient has lost her sheet for April. Gave a reading of 118/78 98 for May, had not taken this am at  the time of the call. Has requested new log sheets. Will send via mail to the patient. 04-14-2021: The patient had not gotten out of bed yet so did not have readings this am. She states that her pressures are doing well at this time and denies any elevated blood pressures.         The patient verbalized understanding of instructions, educational materials, and care plan provided today and declined offer to receive copy of patient instructions, educational materials, and care plan.   Telephone follow up appointment with care management team member scheduled for: 06-09-2021 at 1145 am  [place], MSN, [place] Mobile: [phone]

## 2021-04-14 NOTE — Progress Notes (Signed)
RX for cane placed in front office for pickup

## 2021-04-14 NOTE — Addendum Note (Signed)
Addended by: [name] on: 04/14/2021 10:14 AM   Modules accepted: Orders

## 2021-04-15 IMAGING — CT CT HEAD W/O CM
3 series · 15 of 47 positions shown, 18 images · non-contrast
Comparison: 01/15/2020

CLINICAL DATA: Fell, hit head, anticoagulated

EXAM:
CT HEAD WITHOUT CONTRAST
TECHNIQUE: Contiguous axial images were obtained from the base of the skull
through the vertex without intravenous contrast.

[Series 3: head wo · axial · 0.43mm/px · z∈[-112,+13]mm · 9 of 31 slices shown, 12 images]
[im 3/31  brain]
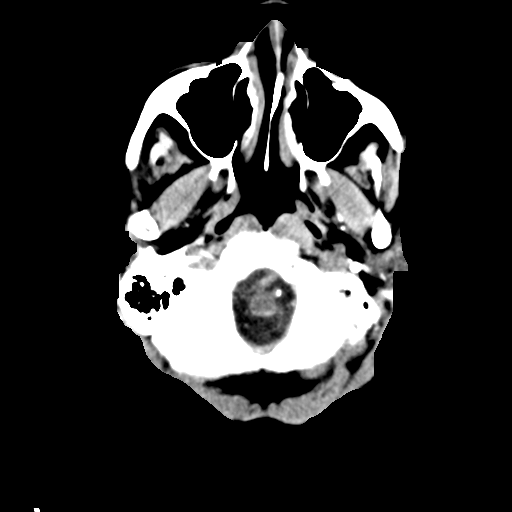
[im 3/31  bone]
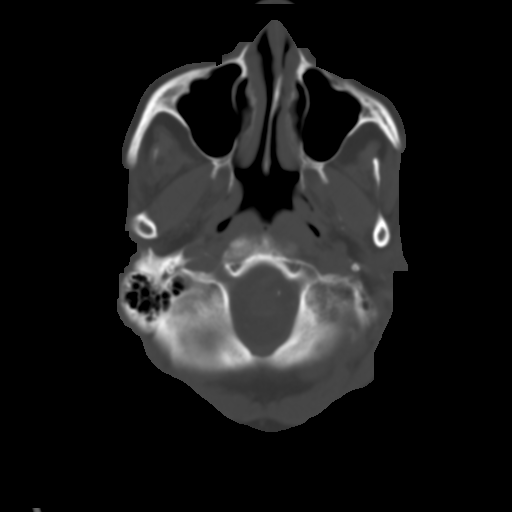
[im 6/31  brain]
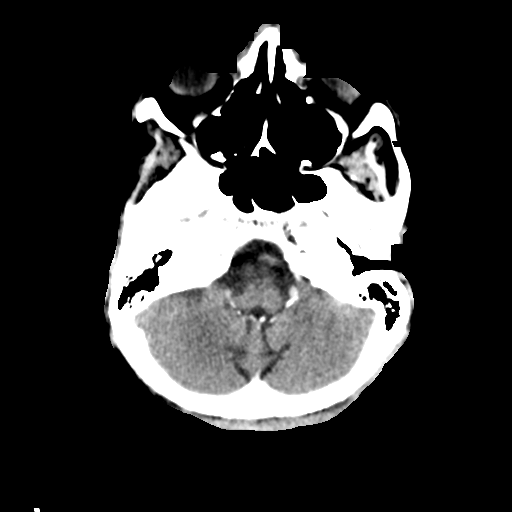
[im 9/31  brain]
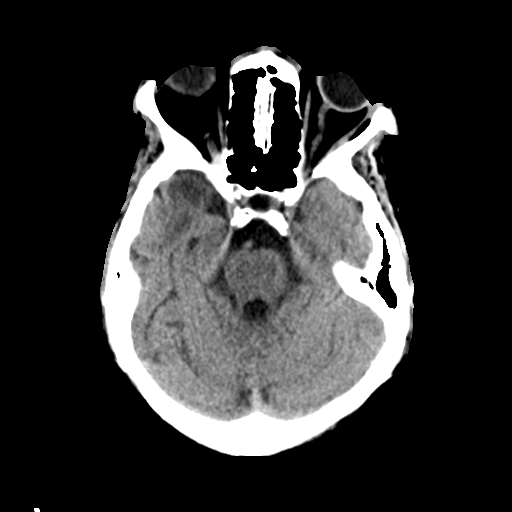
[im 12/31  brain]
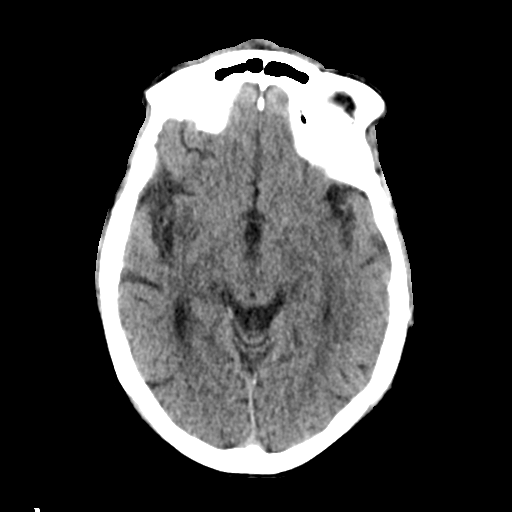
[im 16/31  brain]
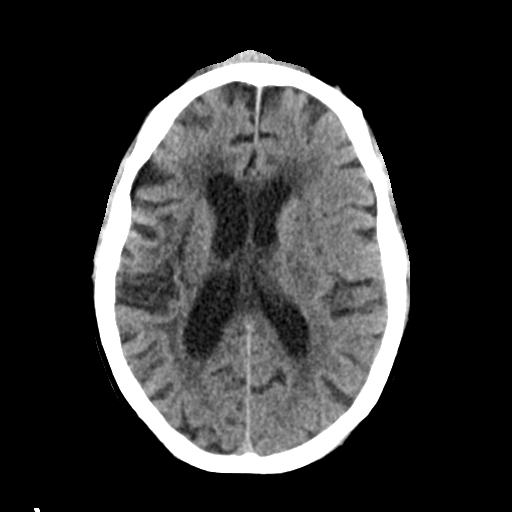
[im 16/31  bone]
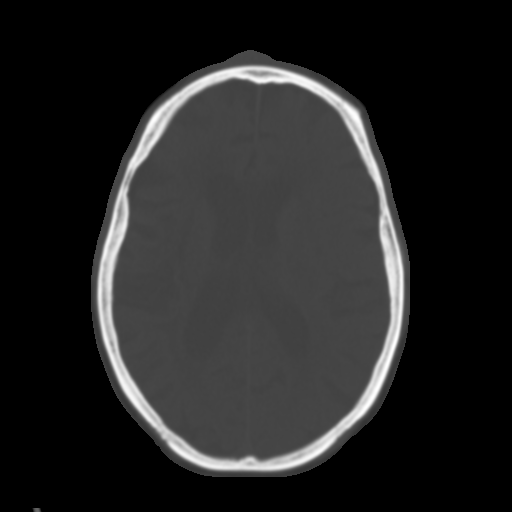
[im 19/31  brain]
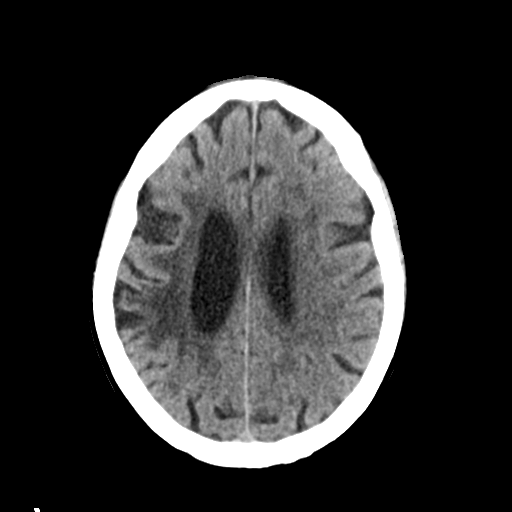
[im 22/31  brain]
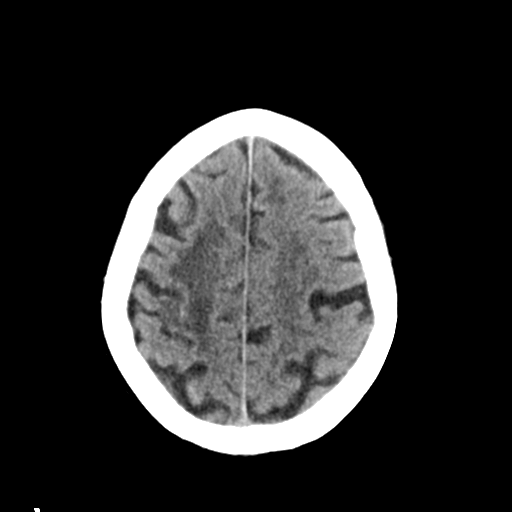
[im 25/31  brain]
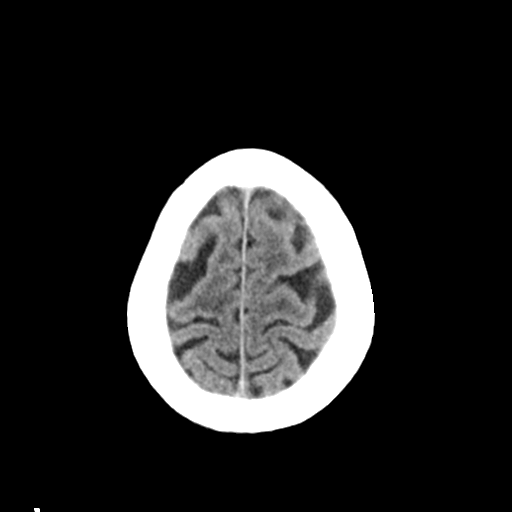
[im 28/31  brain]
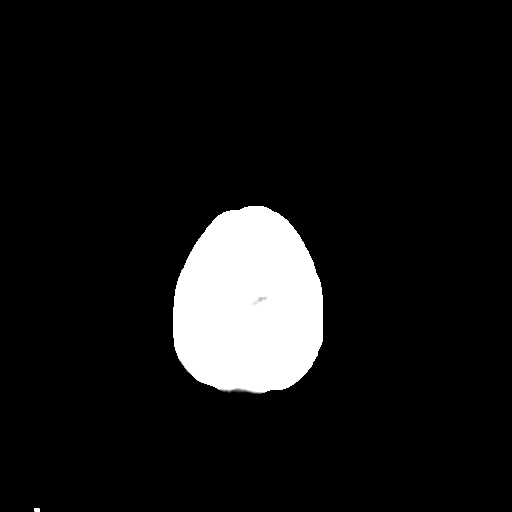
[im 28/31  bone]
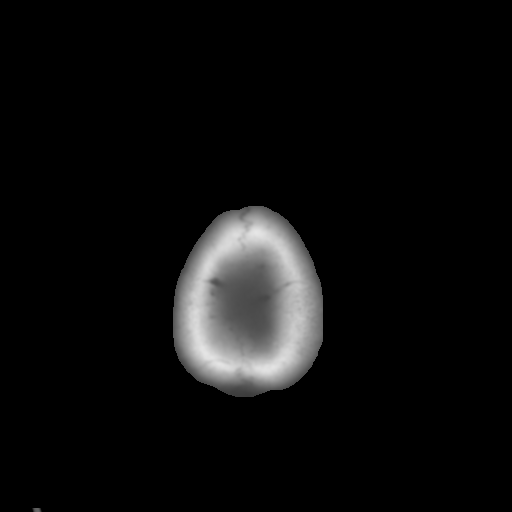

[Series 4: coronal soft tissue · coronal · 0.33mm/px · 3 of 72 slices shown]
[im 24/72  brain]
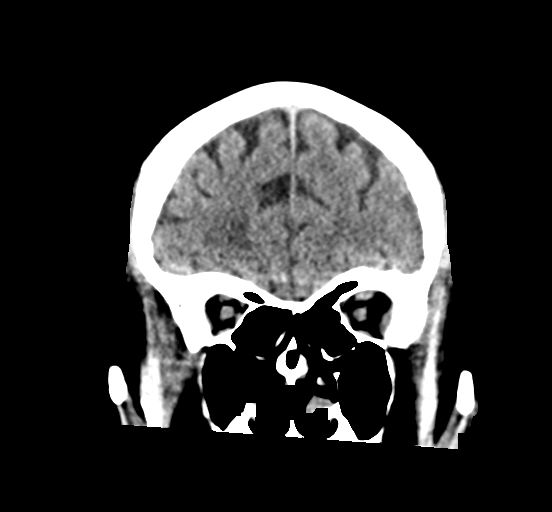
[im 32/72  brain]
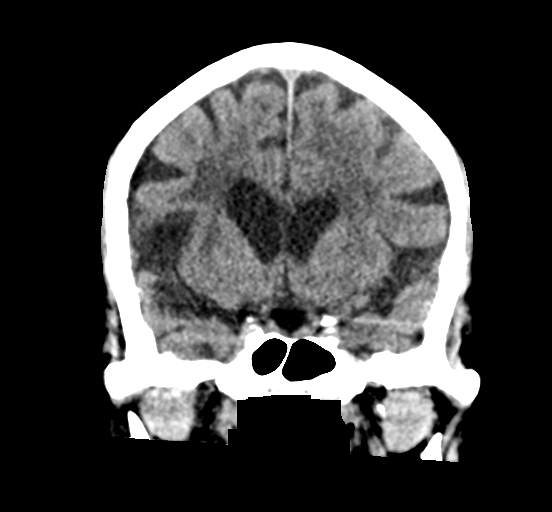
[im 40/72  brain]
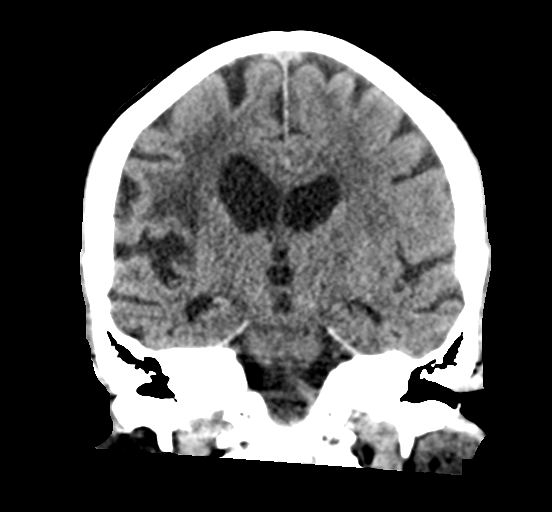

[Series 5: sagittal soft tissue · sagittal · 0.33mm/px · 3 of 51 slices shown]
[im 17/51  brain]
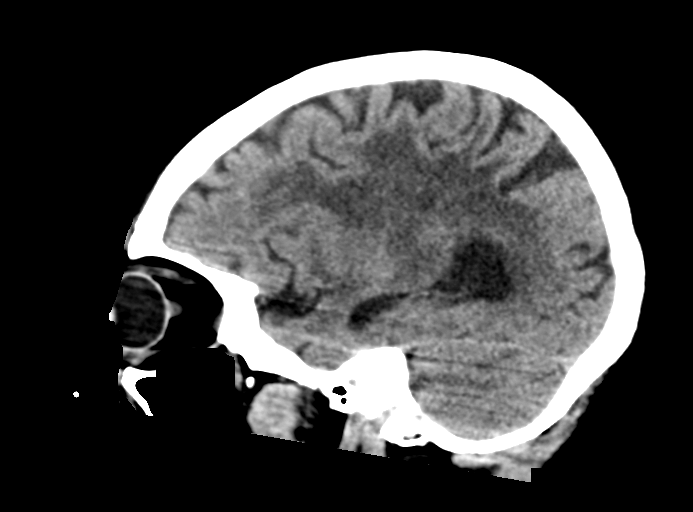
[im 26/51  brain]
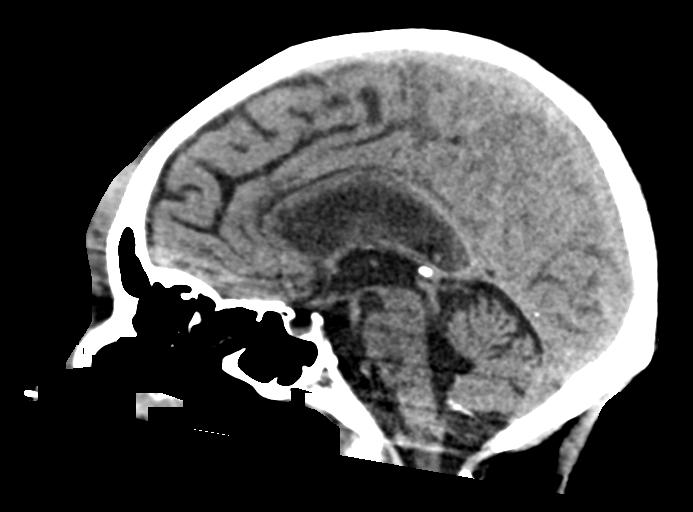
[im 34/51  brain]
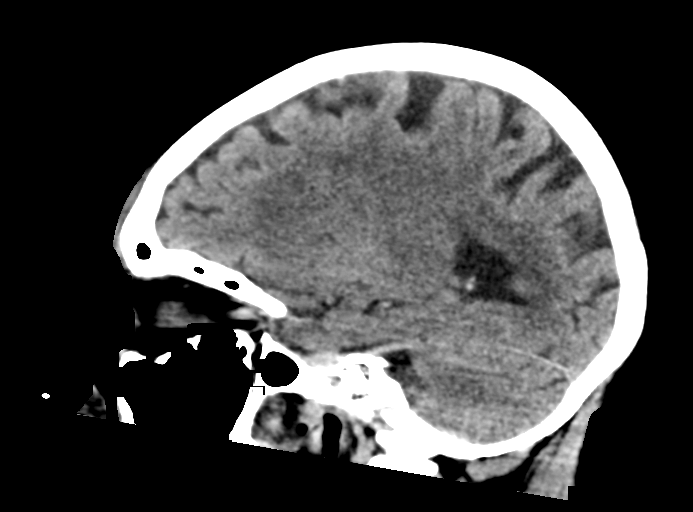

[15 of 47 positions shown; findings below may reference images not displayed]

FINDINGS: Brain: There are stable chronic small-vessel ischemic changes
throughout the bilateral periventricular and subcortical white
matter, as well as the bilateral basal ganglia, right greater than
left. No acute infarct or hemorrhage. Ex vacuo dilatation right
lateral ventricle. Otherwise the lateral ventricles and midline
structures are unremarkable. No acute extra-axial fluid collections.
No mass effect.

Vascular: No hyperdense vessel or unexpected calcification.

Skull: Midline frontal scalp hematoma. No underlying fracture.
Remainder of the calvarium is unremarkable.

Sinuses/Orbits: Minimally displaced right-sided nasal bone fracture.
The sinuses are clear.

Other: Reconstructed images demonstrate no additional findings.
IMPRESSION: 1. Midline frontal scalp hematoma.  No underlying fracture.
2. No acute intracranial process.
3. Chronic ischemic changes as above.

## 2021-04-15 IMAGING — CT CT CERVICAL SPINE W/O CM
3 of 4 series · 10 of 33 positions shown, 12 images · non-contrast
Comparison: None.

CLINICAL DATA: Fell, pain

EXAM:
CT CERVICAL SPINE WITHOUT CONTRAST
TECHNIQUE: Multidetector CT imaging of the cervical spine was performed without
intravenous contrast. Multiplanar CT image reconstructions were also
generated.

[Series 5: sagittal bone · sagittal · 0.25mm/px · 5 of 55 slices shown, 6 images]
[im 19/55  bone]
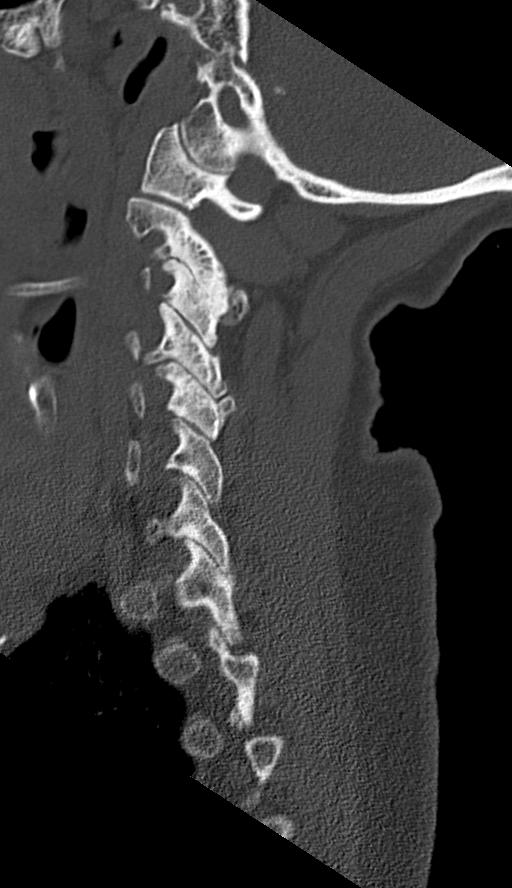
[im 23/55  bone]
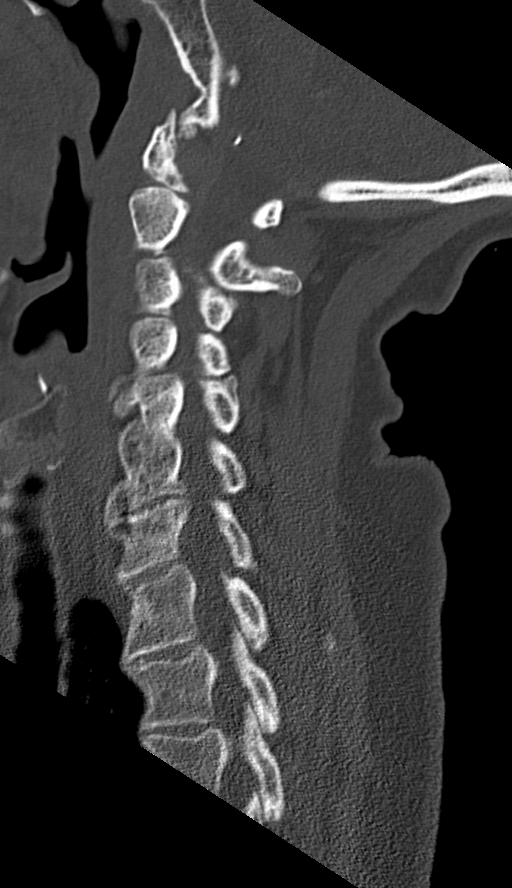
[im 28/55  soft-tissue]
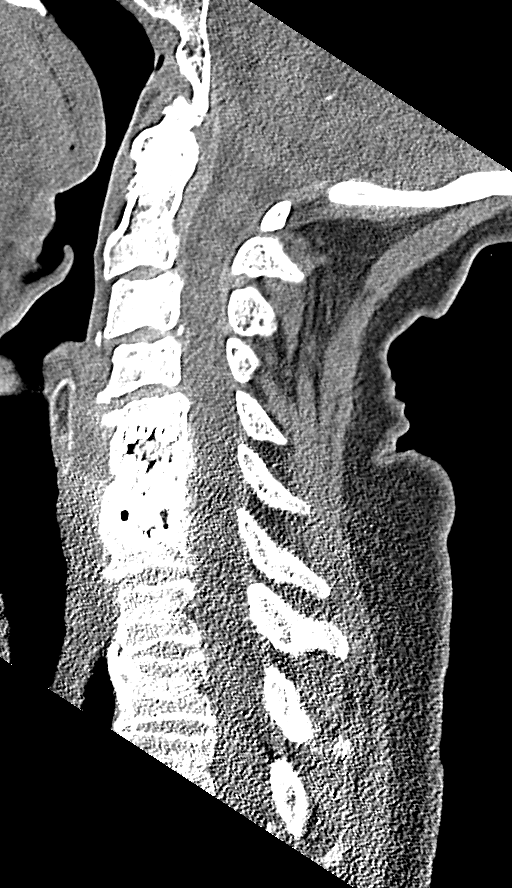
[im 28/55  bone]
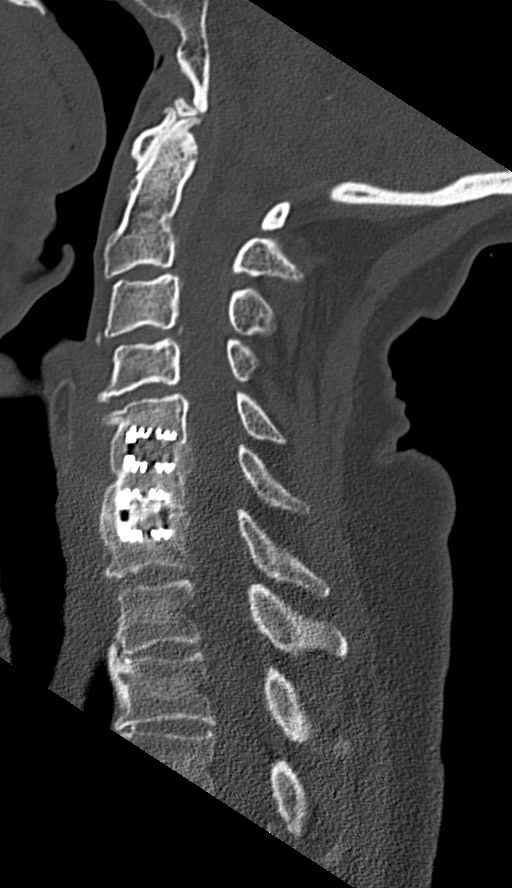
[im 32/55  bone]
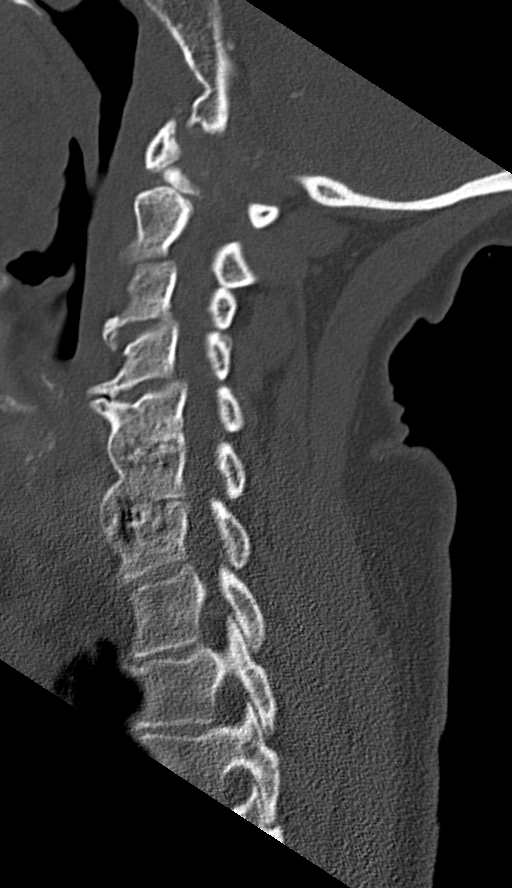
[im 37/55  bone]
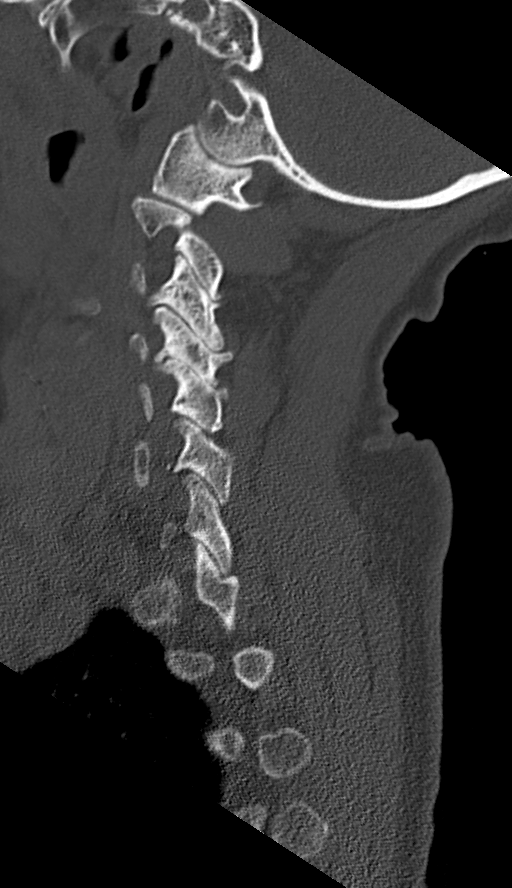

[Series 6: coronal bone · coronal · 0.21mm/px · 3 of 51 slices shown]
[im 12/51  bone]
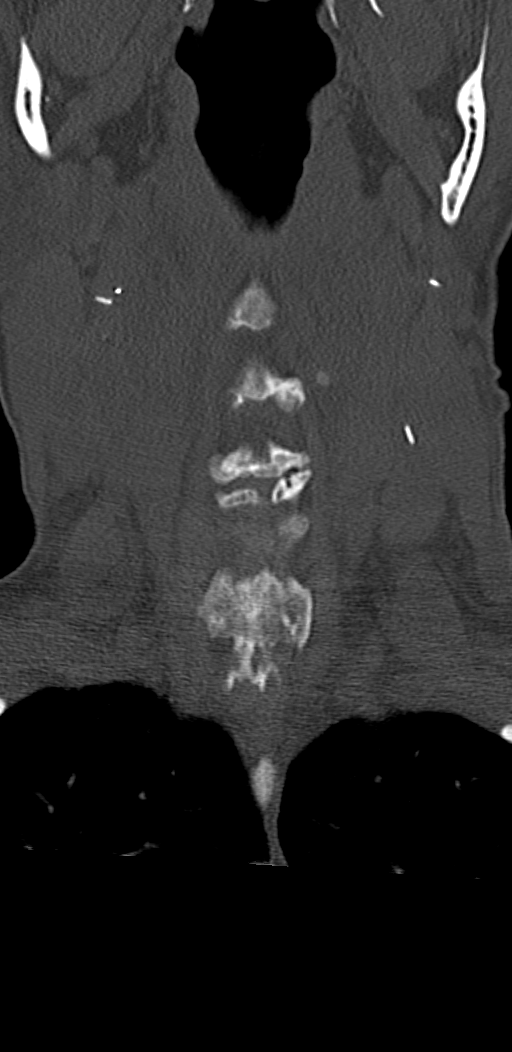
[im 21/51  bone]
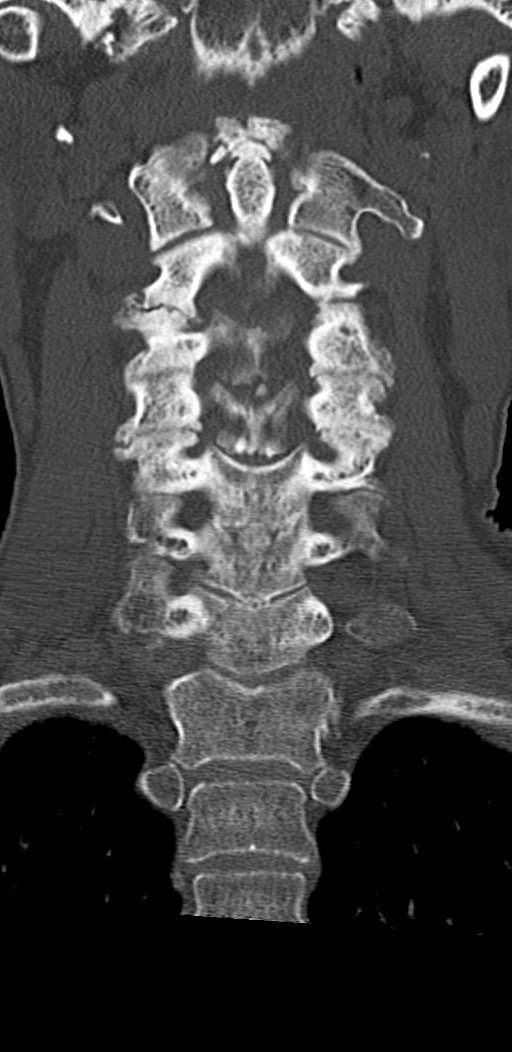
[im 30/51  bone]
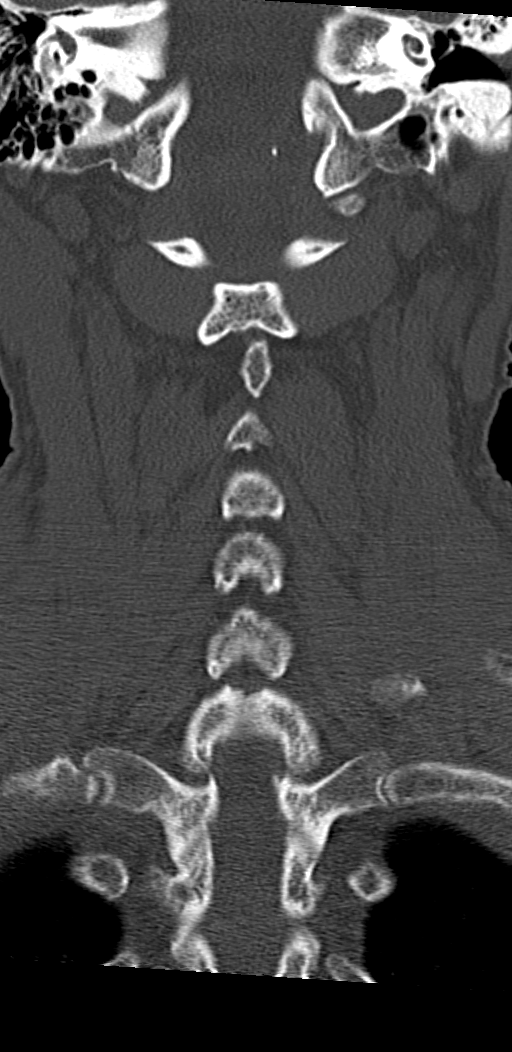

[Series 7: orthogonal axials · axial · 0.21mm/px · z∈[-270,-190]mm · 2 of 112 slices shown, 3 images]
[im 32/112  soft-tissue]
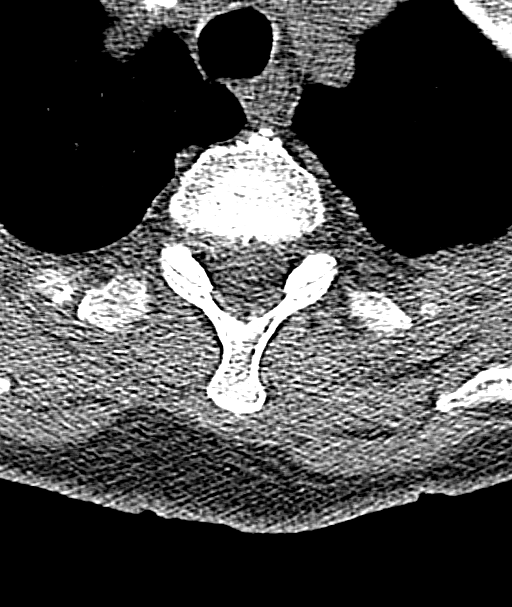
[im 32/112  bone]
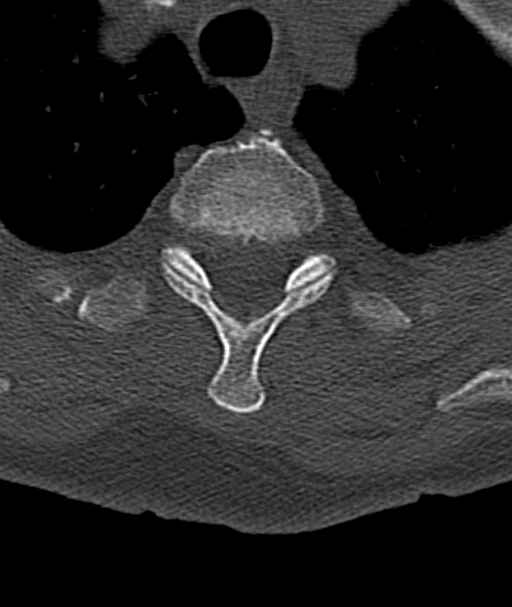
[im 80/112  bone]
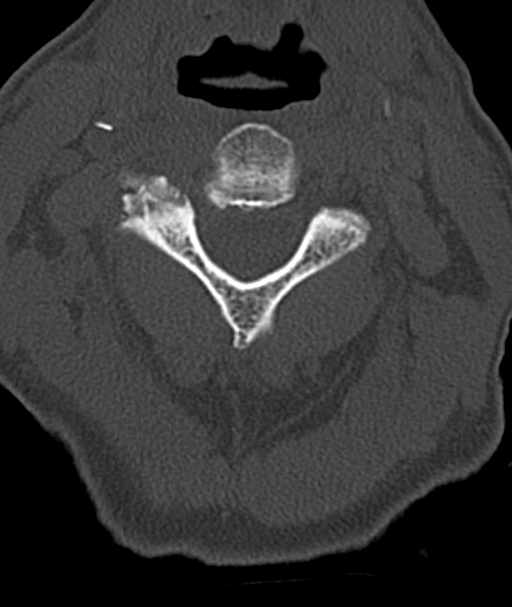

[10 of 33 positions shown; findings below may reference images not displayed]

FINDINGS: Alignment: Alignment is anatomic.

Skull base and vertebrae: No acute fracture. No primary bone lesion
or focal pathologic process.

Soft tissues and spinal canal: No prevertebral fluid or swelling. No
visible canal hematoma.

Disc levels: Postsurgical changes from discectomy and fusion
spanning C5-6 and C6-7. Moderate spondylosis at C4-5. Diffuse facet
hypertrophy greatest at C2-3, C3-4, and C4-5. Right predominant
neural foraminal narrowing at C2-3, with symmetrical neural
foraminal encroachment at C3-4 and C4-5.

Upper chest: Airway is patent. Emphysematous changes are seen at the
lung apices.

Other: Reconstructed images demonstrate no additional findings.
IMPRESSION: 1. No acute cervical spine fracture.
2. Postsurgical changes from discectomy and fusion at C5-6 and C6-7.
3. Prominent spondylosis and facet hypertrophy from C2-3 through
C4-5.

## 2021-04-19 ENCOUNTER — Telehealth: Payer: Self-pay | Admitting: Internal Medicine

## 2021-04-19 NOTE — Telephone Encounter (Signed)
Pt called requesting a freestyle libre for her arm and also a cane for walking   [place], [address] [place] [address]  Phone: [phone] Fax: [phone]   Says she already spoke to PCP about both requests

## 2021-04-19 NOTE — Telephone Encounter (Signed)
Please review messages

## 2021-04-20 ENCOUNTER — Other Ambulatory Visit: Payer: Self-pay

## 2021-04-20 ENCOUNTER — Emergency Department: Payer: Medicare Other

## 2021-04-20 ENCOUNTER — Emergency Department
Admission: EM | Admit: 2021-04-20 | Discharge: 2021-04-21 | Disposition: A | Discharge status: Home / Self Care | Payer: Medicare Other | Attending: Emergency Medicine | Admitting: Emergency Medicine

## 2021-04-20 DIAGNOSIS — R0789 Other chest pain: Secondary | ICD-10-CM | POA: Insufficient documentation

## 2021-04-20 DIAGNOSIS — Z79899 Other long term (current) drug therapy: Secondary | ICD-10-CM | POA: Diagnosis not present

## 2021-04-20 DIAGNOSIS — I10 Essential (primary) hypertension: Secondary | ICD-10-CM | POA: Insufficient documentation

## 2021-04-20 DIAGNOSIS — J449 Chronic obstructive pulmonary disease, unspecified: Secondary | ICD-10-CM | POA: Diagnosis not present

## 2021-04-20 DIAGNOSIS — J45909 Unspecified asthma, uncomplicated: Secondary | ICD-10-CM | POA: Diagnosis not present

## 2021-04-20 DIAGNOSIS — E1159 Type 2 diabetes mellitus with other circulatory complications: Secondary | ICD-10-CM | POA: Diagnosis not present

## 2021-04-20 DIAGNOSIS — E1169 Type 2 diabetes mellitus with other specified complication: Secondary | ICD-10-CM | POA: Diagnosis not present

## 2021-04-20 DIAGNOSIS — F17210 Nicotine dependence, cigarettes, uncomplicated: Secondary | ICD-10-CM | POA: Insufficient documentation

## 2021-04-20 DIAGNOSIS — E785 Hyperlipidemia, unspecified: Secondary | ICD-10-CM | POA: Diagnosis not present

## 2021-04-20 DIAGNOSIS — E1140 Type 2 diabetes mellitus with diabetic neuropathy, unspecified: Secondary | ICD-10-CM | POA: Diagnosis not present

## 2021-04-20 DIAGNOSIS — Z743 Need for continuous supervision: Secondary | ICD-10-CM | POA: Diagnosis not present

## 2021-04-20 DIAGNOSIS — Z7902 Long term (current) use of antithrombotics/antiplatelets: Secondary | ICD-10-CM | POA: Diagnosis not present

## 2021-04-20 DIAGNOSIS — E538 Deficiency of other specified B group vitamins: Secondary | ICD-10-CM | POA: Diagnosis not present

## 2021-04-20 DIAGNOSIS — D649 Anemia, unspecified: Secondary | ICD-10-CM | POA: Diagnosis not present

## 2021-04-20 DIAGNOSIS — Z7982 Long term (current) use of aspirin: Secondary | ICD-10-CM | POA: Insufficient documentation

## 2021-04-20 DIAGNOSIS — R079 Chest pain, unspecified: Secondary | ICD-10-CM | POA: Diagnosis not present

## 2021-04-20 LAB — CBC
HCT: 33.5 % — ABNORMAL LOW (ref 36.0–46.0)
Hemoglobin: 11.3 g/dL — ABNORMAL LOW (ref 12.0–15.0)
MCH: 32.8 pg (ref 26.0–34.0)
MCHC: 33.7 g/dL (ref 30.0–36.0)
MCV: 97.1 fL (ref 80.0–100.0)
Platelets: 230 10*3/uL (ref 150–400)
RBC: 3.45 MIL/uL — ABNORMAL LOW (ref 3.87–5.11)
RDW: 13.8 % (ref 11.5–15.5)
WBC: 5.9 10*3/uL (ref 4.0–10.5)
nRBC: 0 % (ref 0.0–0.2)

## 2021-04-20 LAB — COMPREHENSIVE METABOLIC PANEL
ALT: 16 U/L (ref 0–44)
AST: 20 U/L (ref 15–41)
Albumin: 4.1 g/dL (ref 3.5–5.0)
Alkaline Phosphatase: 98 U/L (ref 38–126)
Anion gap: 9 (ref 5–15)
BUN: 18 mg/dL (ref 8–23)
CO2: 25 mmol/L (ref 22–32)
Calcium: 9.5 mg/dL (ref 8.9–10.3)
Chloride: 104 mmol/L (ref 98–111)
Creatinine, Ser: 0.91 mg/dL (ref 0.44–1.00)
GFR, Estimated: >60 mL/min (ref >60)
Glucose, Bld: 175 mg/dL — ABNORMAL HIGH (ref 70–99)
Potassium: 3.9 mmol/L (ref 3.5–5.1)
Sodium: 138 mmol/L (ref 135–145)
Total Bilirubin: 0.6 mg/dL (ref 0.3–1.2)
Total Protein: 7.6 g/dL (ref 6.5–8.1)

## 2021-04-20 LAB — TROPONIN I (HIGH SENSITIVITY): Troponin I (High Sensitivity): 3 ng/L (ref <18)

## 2021-04-20 NOTE — ED Notes (Signed)
First nurse-pt brought in viaems from home with chest pain.  Iv in place.  Ems gave 1 sl ntg, 324 asa.  Bp140/77 per ems.  Pt alert.

## 2021-04-20 NOTE — ED Provider Notes (Signed)
[facility] Emergency Department Provider Note  ____________________________________________   Event Date/Time   First MD Initiated Contact with Patient 04/20/21 2257     (approximate)  I have reviewed the triage vital signs and the nursing notes.   HISTORY  Chief Complaint Chest Pain    HPI Kendra [name] is a 75 y.o. female with medical history as listed below who presents by EMS for evaluation of cute onset severe chest pressure.  She said that at some point in the afternoon (she thinks it was around 2 PM) she had acute onset severe chest pressure that felt like someone was sitting on her chest.  She said it occurred while she was smoking.  She believes the pain lasted about 20 minutes and then her son called 911.  However the time does not correspond exactly because she came into the emergency department later in the evening, around 9:30 to 10 PM.  Regardless she reports that she is not having sharp pain, just a sense of heavy chest pressure that was radiating through to her back.  She denies shortness of breath.  She has had a mild cough recently but nothing out of the ordinary.  She smokes daily.  She denies fever, sore throat, nausea, vomiting, sweating, abdominal pain, and dysuria.  After EMS arrived they provided a full dose aspirin and 2 nitroglycerin tablets which she says completely took away the pain.  She has had no additional chest pain since coming to the hospital.  She is currently comfortable and asymptomatic.  She states that when the pressure was occurring, it felt worse if she moved in certain positions and better if she lies still.  She reports no prior history of heart problems.  She remembers getting echocardiogram last year but does not know why.  She says that she used to be on medicine for diabetes but she was taken off with insulin.  She states that she used to take a blood thinner but was taken off of that as well.     Past  Medical History:  Diagnosis Date   Asthma    Back pain    Cataract    Cervical disc disorder    COPD (chronic obstructive pulmonary disease) (HCC)    Depression    Diabetes (HCC)    type II   Diabetic neuropathy (HCC)    Dyspnea    with exertion   GERD (gastroesophageal reflux disease)    History of kidney stones    per patient "can't remember the year"   Hyperlipidemia    Hypertension    per patient "take meds for it"   Insomnia    Neuropathy    Osteoarthritis    Osteopenia    Pneumonia 2018   per patient   Reflux    Rheumatoid arthritis (HCC)    Stroke (HCC) 2015   and again 2017  - Weakness in left leg, staggers w/ walking, vision    Tenosynovitis     Patient Active Problem List   Diagnosis Date Noted   Hemiparesis affecting left side as late effect of stroke (HCC) 02/02/2021   Recurrent major depressive disorder, in partial remission (HCC) 02/02/2021   Carotid artery stenosis 12/30/2019   Hypertension associated with diabetes (HCC) 12/19/2018   Diabetes mellitus type 2, controlled, with complications (HCC) 06/06/2015   Insomnia 03/30/2015   Hyperlipidemia associated with type 2 diabetes mellitus (HCC) 03/30/2015   Gastro-esophageal reflux disease without esophagitis 03/30/2015   History of cerebrovascular  accident with residual deficit 03/30/2015   Osteoarthritis 03/30/2015   Osteopenia 03/30/2015   B12 deficiency 03/30/2015    Past Surgical History:  Procedure Laterality Date   BREAST BIOPSY Right    Fatty Tumor   ENDARTERECTOMY Right 10/21/2019   Procedure: ENDARTERECTOMY CAROTID RIGHT;  Surgeon: [name], MD;  Location: [facility];  Service: Vascular;  Laterality: Right;   ENDARTERECTOMY Left 12/30/2019   Procedure: LEFT CAROTID ENDARTERECTOMY;  Surgeon: [name], MD;  Location: [facility];  Service: Vascular;  Laterality: Left;   NECK SURGERY     OVARIAN CYST REMOVAL     PATCH ANGIOPLASTY Right 10/21/2019   Procedure: Patch  Angioplasty Using XenoSure Biologic Patch Right Carotid;  Surgeon: [name], MD;  Location: [place];  Service: Vascular;  Laterality: Right;   PATCH ANGIOPLASTY Left 12/30/2019   Procedure: Patch Angioplasty Left Carotid;  Surgeon: [name], MD;  Location: [facility];  Service: Vascular;  Laterality: Left;    Prior to Admission medications   Medication Sig Start Date End Date Taking? Authorizing Provider  Acetaminophen (TYLENOL 8 HOUR ARTHRITIS PAIN PO) Take 2 capsules by mouth in the morning, at noon, in the evening, and at bedtime.   Yes [provider]  aspirin 325 MG tablet Take 325 mg by mouth daily.   Yes [provider]  atorvastatin (LIPITOR) 40 MG tablet TAKE 1 TABLET BY MOUTH BEDTIME 04/04/21  Yes [name], NP  Blood Glucose Monitoring Suppl (ONETOUCH VERIO) w/Device KIT USE AS DIRECTED 04/05/20  Yes Malfi, [name], FNP  buPROPion (WELLBUTRIN XL) 150 MG 24 hr tablet TAKE 1 TABLET BY MOUTH ONCE DAILY 03/07/21  Yes [name], NP  clopidogrel (PLAVIX) 75 MG tablet Take 1 tablet by mouth daily. 02/09/21  Yes [provider]  docusate sodium (COLACE) 100 MG capsule Take 1 capsule (100 mg total) by mouth 2 (two) times daily as needed for mild constipation. 03/10/21  Yes [name], NP  glucose blood [name] VERIO) test strip Use as instructed 12/15/20  Yes Karamalegos, Devonne Doughty, DO  Lancet Devices (ONE TOUCH DELICA LANCING DEV) MISC 1 Device by Does not apply route 2 (two) times daily. 01/02/20  Yes Malfi, [name], FNP  OneTouch Delica Lancets 13Y MISC 1 Device by Does not apply route 2 (two) times daily. 12/15/20  Yes Karamalegos, Devonne Doughty, DO  traZODone (DESYREL) 50 MG tablet Take 0.5-1 tablets (25-50 mg total) by mouth at bedtime as needed for sleep. 04/05/21  Yes [name], Coralie Keens, NP  TRELEGY ELLIPTA 100-62.5-25 MCG/INH AEPB USE ONE INHALATION INTO THE LUNGS ONCE A DAY RINSE MOUTH AFTER EACH USE 04/12/21  Yes [name], NP  albuterol (VENTOLIN HFA) 108 (90 Base) MCG/ACT inhaler INHALE 1-2 PUFFS BY MOUTH EVERY 6 HOURS AS NEEDED WHEEZING/ SHORTNESS OF BREATH 04/12/21   [name], NP    Allergies Citalopram  Family History  Problem Relation Age of Onset   Stroke Father    Depression Father    Diabetes Mother    Hyperlipidemia Mother    Breast cancer Mother    Heart murmur Son    Heart murmur Son    Heart murmur Son     Social History Social History   Tobacco Use   Smoking status: Some Days    Packs/day: 0.10    Years: 61.00    Pack years: 6.10    Types: Cigarettes   Smokeless tobacco: Former  Types: Snuff   Tobacco comments:    .5 of 1 cigarette a day  Vaping Use   Vaping Use: Never used  Substance Use Topics   Alcohol use: No   Drug use: No    Review of Systems Constitutional: No fever/chills Eyes: No visual changes. ENT: No sore throat. Cardiovascular: Positive for chest pressure. Respiratory: Denies shortness of breath. Gastrointestinal: No abdominal pain.  No nausea, no vomiting.  No diarrhea.  No constipation. Genitourinary: Negative for dysuria. Musculoskeletal: Negative for neck pain.  Negative for back pain. Integumentary: Negative for rash. Neurological: Negative for headaches, focal weakness or numbness.   ____________________________________________   PHYSICAL EXAM:  VITAL SIGNS: ED Triage Vitals [04/20/21 2150]  Enc Vitals Group     BP 132/77     Pulse Rate 90     Resp 18     Temp 98.3 F (36.8 C)     Temp Source Oral     SpO2 98 %     Weight 55.8 kg (123 lb)     Height 1.626 m ($Remove'5\' 4"'whCpPlA$ )     Head Circumference      Peak Flow      Pain Score 0     Pain Loc      Pain Edu?      Excl. in [place]?     Constitutional: Alert and oriented.  Elderly but in no acute distress. Eyes: Conjunctivae are normal.  Head: Atraumatic. Nose: No congestion/rhinnorhea. Mouth/Throat: Patient is wearing a mask. Neck: No stridor.  No meningeal signs.    Cardiovascular: Normal rate, regular rhythm. Good peripheral circulation. Respiratory: Normal respiratory effort.  No retractions. Gastrointestinal: Soft and nontender. No distention.  Musculoskeletal: No lower extremity tenderness nor edema. No gross deformities of extremities. Neurologic:  Normal speech and language. No gross focal neurologic deficits are appreciated.  Skin:  Skin is warm, dry and intact. Psychiatric: Mood and affect are normal. Speech and behavior are normal.  ____________________________________________   LABS (all labs ordered are listed, but only abnormal results are displayed)  Labs Reviewed  CBC - Abnormal; Notable for the following components:      Result Value   RBC 3.45 (*)    Hemoglobin 11.3 (*)    HCT 33.5 (*)    All other components within normal limits  COMPREHENSIVE METABOLIC PANEL - Abnormal; Notable for the following components:   Glucose, Bld 175 (*)    All other components within normal limits  TROPONIN I (HIGH SENSITIVITY)  TROPONIN I (HIGH SENSITIVITY)   ____________________________________________  EKG  ED ECG REPORT I, Hinda Kehr, the attending physician, personally viewed and interpreted this ECG.  Date: 04/20/2021 EKG Time: 21: 52 Rate: 93 Rhythm: normal sinus rhythm QRS Axis: normal Intervals: normal ST/T Wave abnormalities: Non-specific ST segment / T-wave changes, but no clear evidence of acute ischemia. Narrative Interpretation: no definitive evidence of acute ischemia; does not meet STEMI criteria.  ____________________________________________  RADIOLOGY I, Hinda Kehr, personally viewed and evaluated these images (plain radiographs) as part of my medical decision making, as well as reviewing the written report by the radiologist.  ED MD interpretation: No acute abnormality on chest x-ray  Official radiology report(s): DG Chest 2 View  Result Date: 04/20/2021 CLINICAL DATA:  Chest pain EXAM: CHEST - 2 VIEW  COMPARISON:  02/04/2021 FINDINGS: The heart size and mediastinal contours are within normal limits. Both lungs are clear. The visualized skeletal structures are unremarkable. IMPRESSION: No active cardiopulmonary disease. Electronically Signed   By: Lennette Bihari  [place] M.D.   On: 04/20/2021 22:24    ____________________________________________   PROCEDURES   Procedure(s) performed (including Critical Care):  Procedures   ____________________________________________   INITIAL IMPRESSION / MDM / [place] / ED COURSE  As part of my medical decision making, I reviewed the following data within the [facility] notes reviewed and incorporated, Labs reviewed , EKG interpreted , Old chart reviewed, Radiograph reviewed , and Notes from prior ED visits   Differential diagnosis includes, but is not limited to, ACS including unstable angina, stable angina, PE, AAS, pneumonia, musculoskeletal pain.  Vital signs are stable and reassuring.  No concerning findings on EKG including no evidence of ischemia.  I personally reviewed the patient's imaging and agree with the radiologist's interpretation that there is no evidence of acute abnormality on chest x-ray.  Initial high-sensitivity troponin is normal at 3.  CBC is normal.  Comprehensive metabolic panel is normal other than a very slight elevation of glucose.  Unlikely that her pain is caused by AAS given the stable vital signs and the resolution of her pain.  This appears to be anginal pain although musculoskeletal it is possible given that it seemed to be affected by position and movement.  She has no tenderness to palpation abdomen and I think that biliary colic is very unlikely.  Additionally her labs are all within normal limits and reassuring.  Patient is well-appearing and in no distress.  I talked with her about the findings.  I explained to her that we need an additional troponin and she agrees with that plan.  I  explained to her that given the nature of her symptoms, we could admit her to the hospitalist regardless for unstable angina for additional observation and evaluation/treatment, but she adamantly denies.  She said that she does not want to stay in the hospital unless she absolutely has to.  I told her we would discuss it again after getting the second troponin back which is due at right around midnight.  She agrees with that plan.     Clinical Course as of 04/21/21 0123  Thu Apr 21, 2021  0121 Second troponin is negative.  Patient has been up and ambulating to and from the bathroom without any pain, has not had any additional chest pain since coming to the emergency department.  I talked with her again about the results and again offered to admit her for chest pain observation.  She very much wants to go home.  She will follow-up with her regular doctor but I am also giving her the name and number of a local cardiologist with whom she can follow-up, and I encouraged her to call this morning to schedule the next verbal appointment.  I gave my usual and customary return precautions and she understands and agrees with the plan. [CF]    Clinical Course User Index [CF] Hinda Kehr, MD     ____________________________________________  FINAL CLINICAL IMPRESSION(S) / ED DIAGNOSES  Final diagnoses:  Chest pain, unspecified type     MEDICATIONS GIVEN DURING THIS VISIT:  Medications - No data to display   ED Discharge Orders     None        Note:  This document was prepared using Dragon voice recognition software and may include unintentional dictation errors.   Hinda Kehr, MD 04/21/21 [phone]

## 2021-04-20 NOTE — Telephone Encounter (Signed)
Can you please send in freestyle libre for her  RX for can has been sitting at the front desk for over a week waiting for her to pick up.

## 2021-04-20 NOTE — ED Triage Notes (Signed)
Pt states was watching tv today and started having chest tightness. EMS gave ASA '81mg'$  x 4 and nitro x 1. STates nitro did relieve pain. Pt denies any hx of the same, denies any other symptoms.

## 2021-04-21 LAB — TROPONIN I (HIGH SENSITIVITY): Troponin I (High Sensitivity): 3 ng/L (ref <18)

## 2021-04-21 NOTE — Discharge Instructions (Addendum)
You have been seen in the Emergency Department (ED) today for chest pain.  As we have discussed today's test results are normal, but you may require further testing.  We offered to bring you into the hospital at this time but it is your preference to go home.  Given your reassuring work-up, we think that is okay, but it is very important that you follow-up as an outpatient.  Please follow up with the recommended doctor as instructed above in these documents regarding today's emergent visit and your recent symptoms to discuss further management.  Continue to take your regular medications.   Return to the Emergency Department (ED) if you experience any further chest pain/pressure/tightness, difficulty breathing, or sudden sweating, or other symptoms that concern you.

## 2021-04-26 ENCOUNTER — Ambulatory Visit (INDEPENDENT_AMBULATORY_CARE_PROVIDER_SITE_OTHER): Payer: Medicare Other | Admitting: Licensed Clinical Social Worker

## 2021-04-26 ENCOUNTER — Ambulatory Visit: Payer: Medicare Other | Admitting: Podiatry

## 2021-04-26 DIAGNOSIS — F3341 Major depressive disorder, recurrent, in partial remission: Secondary | ICD-10-CM

## 2021-04-26 DIAGNOSIS — E1159 Type 2 diabetes mellitus with other circulatory complications: Secondary | ICD-10-CM

## 2021-04-26 DIAGNOSIS — E118 Type 2 diabetes mellitus with unspecified complications: Secondary | ICD-10-CM

## 2021-04-26 DIAGNOSIS — F5101 Primary insomnia: Secondary | ICD-10-CM

## 2021-04-26 DIAGNOSIS — I152 Hypertension secondary to endocrine disorders: Secondary | ICD-10-CM

## 2021-04-27 NOTE — Patient Instructions (Signed)
Visit Information   Goals Addressed               This Visit's Progress     Patient Stated     "I probably could benefit from more support." (pt-stated)   On track     Patient Self Care Activities:  Attend all scheduled provider appointments Call provider office for new concerns or questions Utilize healthy coping skills discussed to assist with pain and depression management Continue to comply with med management      SW-I probably could use some extra support right now. (pt-stated)   On track     Patient Self Care Activities:  Attend all scheduled provider appointments Call provider office for new concerns or questions Utilize healthy coping skills discussed to assist with pain and depression management Continue to comply with med management        Patient verbalizes understanding of instructions provided today.   Telephone follow up appointment with care management team member scheduled for:07/12/21  Christa See, MSW, [email or web address] Phone [phone] 2:06 PM

## 2021-04-27 NOTE — Chronic Care Management (AMB) (Signed)
Chronic Care Management    Clinical Social Work Note  04/27/2021 Name: Kendra [name] MRN: [id number] DOB: [birth year]  Kendra [name] is a 75 y.o. year old female who is a primary care patient of [name], NP. The CCM team was consulted to assist the patient with chronic disease management and/or care coordination needs related to: Level of Care Concerns and Mental Health Counseling and Resources.   Engaged with patient by telephone for follow up visit in response to provider referral for social work chronic care management and care coordination services.   Consent to Services:  The patient was given information about Chronic Care Management services, agreed to services, and gave verbal consent prior to initiation of services.  Please see initial visit note for detailed documentation.   Patient agreed to services and consent obtained.   Consent to Services:  The patient was given information about Care Management services, agreed to services, and gave verbal consent prior to initiation of services.   Please see initial visit note for detailed documentation.   Patient agreed to services today and consent obtained.   Assessment: Engaged with patient by telephone in response to provider referral for social work care coordination services: Level of Care Concerns and Mental Health Counseling and Resources.   Patient  continues to maintain positive progress with care plan goals. She is unable to come to visit to pick up script for cane and is requesting that it is mailed to her residence on file. CCM LCSW informed Rachell, Research officer, trade union of patient's request.  See Care Plan below for interventions and patient self-care actives.  Recent life changes or stressors: Management of health conditions  Recommendation: Patient may benefit from, and is in agreement work with LCSW to address care coordination needs and will continue to work with the clinical team to address health care and  disease management related needs.   Follow up Plan: Patient would like continued follow-up from CCM LCSW .  Follow up scheduled in 07/12/21. Patient will call office if needed prior to next encounter.     SDOH (Social Determinants of Health) assessments and interventions performed:    Advanced Directives Status: Not addressed in this encounter.  CCM Care Plan  Allergies  Allergen Reactions   Citalopram Nausea And Vomiting    Outpatient Encounter Medications as of 04/26/2021  Medication Sig   Acetaminophen (TYLENOL 8 HOUR ARTHRITIS PAIN PO) Take 2 capsules by mouth in the morning, at noon, in the evening, and at bedtime.   albuterol (VENTOLIN HFA) 108 (90 Base) MCG/ACT inhaler INHALE 1-2 PUFFS BY MOUTH EVERY 6 HOURS AS NEEDED WHEEZING/ SHORTNESS OF BREATH   aspirin 325 MG tablet Take 325 mg by mouth daily.   atorvastatin (LIPITOR) 40 MG tablet TAKE 1 TABLET BY MOUTH BEDTIME   Blood Glucose Monitoring Suppl (ONETOUCH VERIO) w/Device KIT USE AS DIRECTED   buPROPion (WELLBUTRIN XL) 150 MG 24 hr tablet TAKE 1 TABLET BY MOUTH ONCE DAILY   clopidogrel (PLAVIX) 75 MG tablet Take 1 tablet by mouth daily.   docusate sodium (COLACE) 100 MG capsule Take 1 capsule (100 mg total) by mouth 2 (two) times daily as needed for mild constipation.   glucose blood (ONETOUCH VERIO) test strip Use as instructed   Lancet Devices (ONE TOUCH DELICA LANCING DEV) MISC 1 Device by Does not apply route 2 (two) times daily.   OneTouch Delica Lancets 33G MISC 1 Device by Does not apply route 2 (two) times daily.  traZODone (DESYREL) 50 MG tablet Take 0.5-1 tablets (25-50 mg total) by mouth at bedtime as needed for sleep.   TRELEGY ELLIPTA 100-62.5-25 MCG/INH AEPB USE ONE INHALATION INTO THE LUNGS ONCE A DAY RINSE MOUTH AFTER EACH USE   No facility-administered encounter medications on file as of 04/26/2021.    Patient Active Problem List   Diagnosis Date Noted   Hemiparesis affecting left side as late effect of  stroke ([place]) 02/02/2021   Recurrent major depressive disorder, in partial remission ([place]) 02/02/2021   Carotid artery stenosis 12/30/2019   Hypertension associated with diabetes ([place]) 12/19/2018   Diabetes mellitus type 2, controlled, with complications ([place]) 95/18/8416   Insomnia 03/30/2015   Hyperlipidemia associated with type 2 diabetes mellitus ([place]) 03/30/2015   Gastro-esophageal reflux disease without esophagitis 03/30/2015   History of cerebrovascular accident with residual deficit 03/30/2015   Osteoarthritis 03/30/2015   Osteopenia 03/30/2015   B12 deficiency 03/30/2015    Conditions to be addressed/monitored: Depression; Level of care concerns and Mental Health Concerns   Care Plan : General Social Work (Adult)  Updates made by [name], LCSW since 04/27/2021 12:00 AM     Problem: Coping Skills (General Plan of Care)      Long-Range Goal: Coping Skills Enhanced   Start Date: 12/14/2020  This Visit's Progress: On track  Recent Progress: On track  Priority: Medium  Note:   Timeframe:  Long-Range Goal Priority:  Medium Start Date:   12/14/20                    Expected End Date: 05/18/21                Follow Up Date -07/12/21  Current Barriers:  Financial constraints Limited social support ADL IADL limitations Mental Health Concerns  Social Isolation Limited access to caregiver Inability to perform ADL's independently Inability to perform IADL's independently Lacks knowledge of community resource: grief support resources within the area  Clinical Social Work Clinical Goal(s):  Over the next 120 days, patient will work with SW to address concerns related to gaining additional support within the home and resource connection in order to maintain health and independency within the community  Over the next 120 days, patient will demonstrate improved adherence to self care as evidenced by implementing healthy self-care into her daily routine such as: attending  all medical appointments, taking time for self-reflection, taking medications as prescribed, drinking water and daily exercise to improve mobility.  Interventions: Patient interviewed and appropriate assessments performed CCM LCSW inquired about whether she was able to pick up the prescription for a cane. Patient reports that she is unable to obtain a ride to the office and would like for the script to be mailed to her residence on file CCM LCSW agreed to mail a listing of local DME stores, per patient request Patient denies any recent falls, since the one that occurred in June 2022 Patient shared that she visited the ED after experiencing ongoing chest pains. CCM LCSW discussed recommendation for patient to schedule a follow up appt with PCP. Patient prefers for schedular to call her to schedule follow up appt Patient endorses improvement in sleep hygiene Patient reports frustration that items ordered through [employer] were not sent correctly to her address. Patient states that she contacted customer service and they will correct the mistake and resend items to her CCM LCSW inquired about patient's depression symptoms. Patient reports that her symptoms have been managed well despite the ongoing  pain. CCM LCSW provided validation and support. Patient was encouraged to continue utilizing strategies discussed with provider to assist with pain relief.  Patient is requesting a prescription for a cane. States she continues to experience weakness in her left leg and has difficulty walking Patient reports that she has received the lift and is appreciative for the assistance Reports compliance with medications and management of depression symptoms Discussed plans with patient for ongoing care management follow up and provided patient with direct contact information for care management team Emotional Support provided due to ongoing health complications.  1:1 collaboration with PCP regarding  development and update of comprehensive plan of care as evidenced by provider attestation and co-signature  Discussed plans with patient for ongoing care management follow up and provided patient with direct contact information for care management team Advised patient to contact CCM providers if she has any concerns in regards to her safety, housing or well-being. Patient reports being currently stable.  Patient Self Care Activities:  Attend all scheduled provider appointments Call provider office for new concerns or questions Utilize healthy coping skills discussed to assist with pain and depression management Continue to comply with med management        Christa See, MSW, [email or web address] Phone [phone] 2:03 PM

## 2021-05-06 ENCOUNTER — Ambulatory Visit: Payer: Medicare Other | Admitting: Podiatry

## 2021-05-10 ENCOUNTER — Telehealth: Payer: Self-pay

## 2021-05-10 NOTE — Telephone Encounter (Signed)
Copied from [place] [phone]. Topic: General - Other >> May 10, 2021 12:11 PM Celene Kras wrote: Reason for CRM: Pt called stating that she is needing to have a cane and a stool. She states that she has discussed this with Kathrine Haddock before, but has not had anyone follow up. Please advi   I called and spoke with [facility] and she confirmed that she did receive a letter from use. I informed her the letter that she was referring to was her cane order. She verbalize understanding.

## 2021-05-12 ENCOUNTER — Inpatient Hospital Stay: Payer: Medicare Other | Attending: Oncology

## 2021-05-12 DIAGNOSIS — E538 Deficiency of other specified B group vitamins: Secondary | ICD-10-CM | POA: Diagnosis not present

## 2021-05-12 MED ORDER — CYANOCOBALAMIN 1000 MCG/ML IJ SOLN
1000.0000 ug | Freq: Once | INTRAMUSCULAR | Status: AC
  Administered 2021-05-12: 1000 ug via INTRAMUSCULAR
  Filled 2021-05-12: qty 1

## 2021-05-16 ENCOUNTER — Ambulatory Visit: Payer: Medicare Other | Admitting: Pharmacist

## 2021-05-16 DIAGNOSIS — J432 Centrilobular emphysema: Secondary | ICD-10-CM

## 2021-05-16 DIAGNOSIS — I152 Hypertension secondary to endocrine disorders: Secondary | ICD-10-CM

## 2021-05-16 DIAGNOSIS — E118 Type 2 diabetes mellitus with unspecified complications: Secondary | ICD-10-CM

## 2021-05-16 DIAGNOSIS — E1159 Type 2 diabetes mellitus with other circulatory complications: Secondary | ICD-10-CM

## 2021-05-16 DIAGNOSIS — F17200 Nicotine dependence, unspecified, uncomplicated: Secondary | ICD-10-CM

## 2021-05-16 NOTE — Chronic Care Management (AMB) (Signed)
Chronic Care Management Pharmacy Note  05/16/2021 Name:  Kendra [name] MRN:  [id number] DOB:  Feb 04, 1946   Subjective: Kendra [name] is an 75 y.o. year old female who is a primary patient of [name], NP.  The CCM team was consulted for assistance with disease management and care coordination needs.    Engaged with patient by telephone for follow up visit in response to provider referral for pharmacy case management and/or care coordination services.   Consent to Services:  The patient was given information about Chronic Care Management services, agreed to services, and gave verbal consent prior to initiation of services.  Please see initial visit note for detailed documentation.   Patient Care Team: [name], NP as PCP - General (Internal Medicine) Steele Sizer, MD as Attending Physician (Family Medicine) Christene Lye, MD (General Surgery) Curley Spice Virl Diamond, RPH-CPP as Pharmacist Vanita Ingles, RN as Case Manager (General Practice) [place], [name], FNP as Nurse Practitioner (Family Medicine) [name], LCSW as Social Worker (Licensed Clinical Social Worker)  Recent office visits: None  Hospital visits: Patient seen in [name] ED 8/3-8/4 for chest pain  Objective:  Lab Results  Component Value Date   CREATININE 0.91 04/20/2021   CREATININE 0.82 02/01/2021   CREATININE 0.82 12/21/2020    Lab Results  Component Value Date   HGBA1C 5.8 (A) 12/21/2020   Last diabetic Eye exam: No results found for: HMDIABEYEEXA  Last diabetic Foot exam: No results found for: HMDIABFOOTEX      Component Value Date/Time   CHOL 92 12/21/2020 1134   CHOL 177 06/07/2015 1215   CHOL 141 09/02/2014 0440   TRIG 70 12/21/2020 1134   TRIG 155 09/02/2014 0440   HDL 51 12/21/2020 1134   HDL 56 06/07/2015 1215   HDL 37 (L) 09/02/2014 0440   CHOLHDL 1.8 12/21/2020 1134   VLDL 16 10/17/2019 0606   VLDL 31 09/02/2014 0440   [place] 26  12/21/2020 1134   [place] 73 09/02/2014 0440    Hepatic Function Latest Ref Rng & Units 04/20/2021 02/01/2021 12/21/2020  Total Protein 6.5 - 8.1 g/dL 7.6 7.0 6.7  Albumin 3.5 - 5.0 g/dL 4.1 3.9 -  AST 15 - 41 U/L _0 ALT 0 - 44 U/L _1 Alk Phosphatase 38 - 126 U/L 98 64 -  Total Bilirubin 0.3 - 1.2 mg/dL 0.6 0.5 0.3      Clinical ASCVD: Yes  The ASCVD Risk score Mikey Bussing DC Jr., et al., 2013) failed to calculate for the following reasons:   The patient has a prior MI or stroke diagnosis     Social History   Tobacco Use  Smoking Status Some Days   Packs/day: 0.10   Years: 61.00   Pack years: 6.10   Types: Cigarettes  Smokeless Tobacco Former   Types: Snuff  Tobacco Comments   .5 of 1 cigarette a day   BP Readings from Last 3 Encounters:  04/21/21 128/68  02/22/21 129/73  02/08/21 133/83   Pulse Readings from Last 3 Encounters:  04/21/21 88  02/22/21 81  02/08/21 96   Wt Readings from Last 3 Encounters:  04/20/21 123 lb (55.8 kg)  02/22/21 124 lb 9.6 oz (56.5 kg)  02/08/21 122 lb 14.4 oz (55.7 kg)    Assessment: Review of patient past medical history, allergies, medications, health status, including review of consultants reports, laboratory and other test data, was performed as part  of comprehensive evaluation and provision of chronic care management services.   SDOH:  (Social Determinants of Health) assessments and interventions performed:    CCM Care Plan  Allergies  Allergen Reactions   Citalopram Nausea And Vomiting    Medications Reviewed Today     Reviewed by Daune Perch, CPhT (Pharmacy Technician) on 04/20/21 at 2306  Med List Status: Complete   Medication Order Taking? Sig Documenting Provider Last Dose Status Informant  Acetaminophen (TYLENOL 8 HOUR ARTHRITIS PAIN PO) [id number] Yes Take 2 capsules by mouth in the morning, at noon, in the evening, and at bedtime. [provider] 04/20/2021 Active   albuterol (VENTOLIN HFA) 108  (90 Base) MCG/ACT inhaler [id number] No INHALE 1-2 PUFFS BY MOUTH EVERY 6 HOURS AS NEEDED WHEEZING/ SHORTNESS OF BREATH [name], NP prn prn Active   aspirin 325 MG tablet [id number] Yes Take 325 mg by mouth daily. [provider] 04/20/2021 Active   atorvastatin (LIPITOR) 40 MG tablet [id number] Yes TAKE 1 TABLET BY MOUTH BEDTIME [name], NP 04/19/2021 Active   Blood Glucose Monitoring Suppl [facility] VERIO) w/Device KIT [id number] Yes USE AS DIRECTED [name], FNP 04/20/2021 Active   buPROPion (WELLBUTRIN XL) 150 MG 24 hr tablet [id number] Yes TAKE 1 TABLET BY MOUTH ONCE DAILY [name], NP 04/20/2021 Active   clopidogrel (PLAVIX) 75 MG tablet [id number] Yes Take 1 tablet by mouth daily. [provider] 04/20/2021 Active   docusate sodium (COLACE) 100 MG capsule [id number] Yes Take 1 capsule (100 mg total) by mouth 2 (two) times daily as needed for mild constipation. [name], NP 04/20/2021 Active   glucose blood [facility] VERIO) test strip [id number] Yes Use as instructed Olin Hauser, DO 04/20/2021 Active   Lancet Devices (ONE TOUCH DELICA LANCING DEV) MISC [id number] Yes 1 Device by Does not apply route 2 (two) times daily. [name], FNP 04/20/2021 Active   OneTouch Delica Lancets 48O MISC [id number] Yes 1 Device by Does not apply route 2 (two) times daily. Olin Hauser, DO 04/20/2021 Active   traZODone (DESYREL) 50 MG tablet [id number] Yes Take 0.5-1 tablets (25-50 mg total) by mouth at bedtime as needed for sleep. [name], NP 04/19/2021 Active   TRELEGY ELLIPTA 100-62.5-25 MCG/INH AEPB [id number] Yes USE ONE INHALATION INTO THE LUNGS ONCE A DAY RINSE MOUTH AFTER EACH USE [name], NP 04/20/2021 Active             Patient Active Problem List   Diagnosis Date Noted   Hemiparesis affecting left side as late effect of stroke ([place]) 02/02/2021   Recurrent major depressive disorder, in partial remission ([place]) 02/02/2021   Carotid  artery stenosis 12/30/2019   Hypertension associated with diabetes ([place]) 12/19/2018   Diabetes mellitus type 2, controlled, with complications ([place]) 49/82/6415   Insomnia 03/30/2015   Hyperlipidemia associated with type 2 diabetes mellitus ([place]) 03/30/2015   Gastro-esophageal reflux disease without esophagitis 03/30/2015   History of cerebrovascular accident with residual deficit 03/30/2015   Osteoarthritis 03/30/2015   Osteopenia 03/30/2015   B12 deficiency 03/30/2015    Immunization History  Administered Date(s) Administered   Fluad Quad(high Dose 65+) 05/21/2019   Influenza Split 06/20/2012   Influenza, High Dose Seasonal PF 07/26/2016, 06/14/2017, 07/31/2018   Influenza, Seasonal, Injecte, Preservative Fre 08/04/2010   Influenza,inj,Quad PF,6+ Mos 06/10/2013, 05/26/2014, 06/07/2015   Influenza-Unspecified 05/26/2014, 06/18/2018   PFIZER(Purple Top)SARS-COV-2 Vaccination 03/02/2020, 03/30/2020   Pneumococcal Conjugate-13 10/31/2007, 10/21/2014   Pneumococcal Polysaccharide-23  10/31/2007, 06/20/2012, 07/31/2018   Tdap 08/04/2010   Zoster, Live 06/10/2013    Conditions to be addressed/monitored: HTN, HLD, COPD, and DMII  Care Plan : PharmD - Medication Mgmt  Updates made by Rennis Petty, RPH-CPP since 05/16/2021 12:00 AM     Problem: Disease Progression      Long-Range Goal: Disease Progression Prevented or Minimized   Start Date: 11/01/2020  Expected End Date: 01/30/2021  This Visit's Progress: On track  Recent Progress: On track  Priority: High  Note:   Current Barriers:  Financial Barriers Limited social support  Pharmacist Clinical Goal(s):  Over the next 90 days, patient will maintain adherence to monitoring guidelines and medication adherence to achieve therapeutic efficacy through collaboration with PharmD and provider.   Interventions: 1:1 collaboration with Webb Silversmith, NP regarding development and update of comprehensive plan of care as evidenced by  provider attestation and co-signature Inter-disciplinary care team collaboration (see longitudinal plan of care) Perform chart review. Patient seen in [facility] ED 8/3-8/4 for chest pain. ED provider advised patient to follow up with PCP and schedule for next available appointment with Cardiologist. Note initial appointment scheduled with Cardiologist Dr. Rockey Situ on 9/21 Patient denies further chest pain since seen in ED Confirms understanding of when to return to the ED/seek medical attention Denies need to follow up with PCP at this time. Reports appointment with Cardiologist was first available and confirms will have transportation to attend Reports interested in following up with Care Guide regarding obtaining a new bedside commode but does not have her number. Provide patient with phone number for Care Guide Reports needs to go to [name] to be fitted for a cane of the right height. Checking with family/friends for a ride  T2DM: Current treatment: none Unable to locate recent home blood sugar readings today Encourage patient to keep a log when she monitors and bring record with her to medical appointments   HTN: Current treatment: none Unable to locate recent home blood pressure readings today Encourage patient to keep a log when she monitors and bring record with her to medical appointments  Tobacco Use: Reports has started smoking again; currently smokes ~6 cigarettes/day Encourage smoking cessation Not ready to quit  Medication Management/adherence: Reports continues to take medication from pill pack as packaged by [name] Denies missed doses Reports using maintenance inhaler, Trelegy, daily and rinsing mouth out after each use and rescue inhaler, albuterol, as needed as directed Reports tried taking trazodone 1/2 tablet (25 mg), rather than a full tablet, at bedtime as needed to see if reduced dizziness, but reports resumed taking full tablet when needed as did  not find 1/2 tablet as effective Denies recent episodes of dizziness   Patient Goals/Self-Care Activities Over the next 90 days, patient will:  - take medications as prescribed  Using pill packaging from [facility] - check glucose, document, and provide at future appointments - check blood pressure, document, and provide at future appointments  Follow Up Plan: Telephone follow up appointment with care management team member scheduled for: 06/22/2021 at 9:15 AM       Patient's preferred pharmacy is:  [place], [address] [place] [address] Phone: [phone] Fax: [phone]  [id number] [address]: [phone]: [phone] [facility] [place] [address] Phone: [phone] Fax: [phone]   Follow Up:  Patient agrees to Care Plan and Follow-up.  Wallace Cullens, PharmD, [place], CPP Clinical Pharmacist [facility] [phone]

## 2021-05-16 NOTE — Patient Instructions (Signed)
Visit Information  PATIENT GOALS:  Goals Addressed             This Visit's Progress    Pharmacy - Patient Goals       -Please continue to keep log of home blood sugar results and have for [place] to review during our calls -Please continue to keep log of blood pressure results and have for [place] to review during our calls  Our goal bad cholesterol, or LDL, is less than 70 . This is why it is important to continue taking your atorvastatin.  I look forward to talking to you during our next telephone appointment. Please call if you need something sooner!  Wallace Cullens, PharmD, [place] [phone]         The patient verbalized understanding of instructions, educational materials, and care plan provided today and declined offer to receive copy of patient instructions, educational materials, and care plan.   Telephone follow up appointment with care management team member scheduled for: 06/22/2021 at 9:15 AM

## 2021-05-18 DIAGNOSIS — I152 Hypertension secondary to endocrine disorders: Secondary | ICD-10-CM

## 2021-05-18 DIAGNOSIS — E118 Type 2 diabetes mellitus with unspecified complications: Secondary | ICD-10-CM | POA: Diagnosis not present

## 2021-05-18 DIAGNOSIS — J432 Centrilobular emphysema: Secondary | ICD-10-CM

## 2021-05-18 DIAGNOSIS — F3341 Major depressive disorder, recurrent, in partial remission: Secondary | ICD-10-CM

## 2021-05-18 DIAGNOSIS — E1159 Type 2 diabetes mellitus with other circulatory complications: Secondary | ICD-10-CM | POA: Diagnosis not present

## 2021-05-19 ENCOUNTER — Other Ambulatory Visit: Payer: Self-pay | Admitting: Internal Medicine

## 2021-05-19 MED ORDER — DOCUSATE SODIUM 100 MG PO CAPS: 100.0000 mg | ORAL_CAPSULE | Freq: Two times a day (BID) | ORAL | 0 refills | Status: DC | PRN

## 2021-05-19 NOTE — Telephone Encounter (Signed)
Medication Refill - Medication: docusate sodium (COLACE) 100 MG capsule   Has the patient contacted their pharmacy? No. (Agent: If no, request that the patient contact the pharmacy for the refill.) (Agent: If yes, when and what did the pharmacy advise?)  Preferred Pharmacy (with phone number or street name):  [name] - [place], [place]  [address]  Phone: [phone] Fax: [phone]    Agent: Please be advised that RX refills may take up to 3 business days. We ask that you follow-up with your pharmacy.

## 2021-05-19 NOTE — Telephone Encounter (Signed)
Future visit in 2 months  

## 2021-05-19 NOTE — Telephone Encounter (Signed)
Future appt in 2 months. Medication requesting earlier today .

## 2021-05-30 ENCOUNTER — Other Ambulatory Visit: Payer: Self-pay | Admitting: Internal Medicine

## 2021-05-30 DIAGNOSIS — IMO0002 Reserved for concepts with insufficient information to code with codable children: Secondary | ICD-10-CM

## 2021-05-30 DIAGNOSIS — E785 Hyperlipidemia, unspecified: Secondary | ICD-10-CM

## 2021-05-30 DIAGNOSIS — E1165 Type 2 diabetes mellitus with hyperglycemia: Secondary | ICD-10-CM

## 2021-05-30 NOTE — Telephone Encounter (Signed)
Requested Prescriptions  Pending Prescriptions Disp Refills  . atorvastatin (LIPITOR) 40 MG tablet [Pharmacy Med Name: ATORVASTATIN CALCIUM 40 MG TAB] 90 tablet 0    Sig: TAKE 1 TABLET BY MOUTH BEDTIME     Cardiovascular:  Antilipid - Statins Passed - 05/30/2021 11:28 AM      Passed - Total Cholesterol in normal range and within 360 days    Cholesterol, Total  Date Value Ref Range Status  06/07/2015 177 100 - 199 mg/dL Final   Cholesterol  Date Value Ref Range Status  12/21/2020 92 <200 mg/dL Final  09/02/2014 141 0 - 200 mg/dL Final         Passed - LDL in normal range and within 360 days    Ldl Cholesterol, Calc  Date Value Ref Range Status  09/02/2014 73 0 - 100 mg/dL Final   LDL Cholesterol (Calc)  Date Value Ref Range Status  12/21/2020 26 mg/dL (calc) Final    Comment:    Reference range: <100 . Desirable range <100 mg/dL for primary prevention;   <70 mg/dL for patients with CHD or diabetic patients  with > or = 2 CHD risk factors. [name] LDL-C is now calculated using the Martin-Hopkins  calculation, which is a validated novel method providing  better accuracy than the Friedewald equation in the  estimation of LDL-C.  Cresenciano Genre et al. Annamaria Helling. [id number]): [id number]  (http://education.[facility].com/faq/FAQ164)          Passed - HDL in normal range and within 360 days    HDL Cholesterol  Date Value Ref Range Status  09/02/2014 37 (L) 40 - 60 mg/dL Final   HDL  Date Value Ref Range Status  12/21/2020 51 > OR = 50 mg/dL Final  06/07/2015 56 >39 mg/dL Final    Comment:    According to ATP-III Guidelines, HDL-C >59 mg/dL is considered a negative risk factor for CHD.          Passed - Triglycerides in normal range and within 360 days    Triglycerides  Date Value Ref Range Status  12/21/2020 70 <150 mg/dL Final  09/02/2014 155 0 - 200 mg/dL Final         Passed - Patient is not pregnant      Passed - Valid encounter within last 12 months    Recent  Outpatient Visits          3 months ago Closed fracture of multiple ribs of left side, initial encounter   [name] [place], Coralie Keens, NP   3 months ago Left hand pain   [facility] [place], [place] W, NP   5 months ago Uncontrolled type 2 diabetes mellitus with insulin therapy [facility])   [facility] Kathrine Haddock, NP   8 months ago Controlled type 2 diabetes mellitus with complication, with long-term current use of insulin [facility])   [facility], [name], FNP   11 months ago Insomnia, unspecified type   [place], FNP      Future Appointments            Tomorrow [name], Coralie Keens, NP [facility], [place]   In 1 week Gollan, Kathlene November, MD [facility], [facility]   In 2 months [place], Coralie Keens, NP [facility], [facility]

## 2021-05-31 ENCOUNTER — Encounter: Payer: Self-pay | Admitting: Internal Medicine

## 2021-05-31 ENCOUNTER — Ambulatory Visit (INDEPENDENT_AMBULATORY_CARE_PROVIDER_SITE_OTHER): Payer: Medicare Other | Admitting: Internal Medicine

## 2021-05-31 ENCOUNTER — Other Ambulatory Visit: Payer: Self-pay

## 2021-05-31 VITALS — BP 139/76 | HR 88 | Temp 97.7°F | Resp 17 | Ht 64.0 in | Wt 129.6 lb

## 2021-05-31 DIAGNOSIS — F5101 Primary insomnia: Secondary | ICD-10-CM

## 2021-05-31 DIAGNOSIS — F3341 Major depressive disorder, recurrent, in partial remission: Secondary | ICD-10-CM

## 2021-05-31 DIAGNOSIS — D508 Other iron deficiency anemias: Secondary | ICD-10-CM

## 2021-05-31 DIAGNOSIS — E118 Type 2 diabetes mellitus with unspecified complications: Secondary | ICD-10-CM

## 2021-05-31 DIAGNOSIS — I6523 Occlusion and stenosis of bilateral carotid arteries: Secondary | ICD-10-CM

## 2021-05-31 DIAGNOSIS — I69354 Hemiplegia and hemiparesis following cerebral infarction affecting left non-dominant side: Secondary | ICD-10-CM

## 2021-05-31 DIAGNOSIS — J410 Simple chronic bronchitis: Secondary | ICD-10-CM

## 2021-05-31 DIAGNOSIS — Z23 Encounter for immunization: Secondary | ICD-10-CM

## 2021-05-31 DIAGNOSIS — E1169 Type 2 diabetes mellitus with other specified complication: Secondary | ICD-10-CM | POA: Diagnosis not present

## 2021-05-31 DIAGNOSIS — M150 Primary generalized (osteo)arthritis: Secondary | ICD-10-CM

## 2021-05-31 DIAGNOSIS — E1159 Type 2 diabetes mellitus with other circulatory complications: Secondary | ICD-10-CM | POA: Diagnosis not present

## 2021-05-31 DIAGNOSIS — M8588 Other specified disorders of bone density and structure, other site: Secondary | ICD-10-CM

## 2021-05-31 DIAGNOSIS — I700 Atherosclerosis of aorta: Secondary | ICD-10-CM | POA: Diagnosis not present

## 2021-05-31 DIAGNOSIS — M8949 Other hypertrophic osteoarthropathy, multiple sites: Secondary | ICD-10-CM | POA: Diagnosis not present

## 2021-05-31 DIAGNOSIS — K219 Gastro-esophageal reflux disease without esophagitis: Secondary | ICD-10-CM

## 2021-05-31 DIAGNOSIS — J449 Chronic obstructive pulmonary disease, unspecified: Secondary | ICD-10-CM | POA: Insufficient documentation

## 2021-05-31 DIAGNOSIS — M159 Polyosteoarthritis, unspecified: Secondary | ICD-10-CM

## 2021-05-31 DIAGNOSIS — E785 Hyperlipidemia, unspecified: Secondary | ICD-10-CM

## 2021-05-31 DIAGNOSIS — D649 Anemia, unspecified: Secondary | ICD-10-CM | POA: Insufficient documentation

## 2021-05-31 DIAGNOSIS — I152 Hypertension secondary to endocrine disorders: Secondary | ICD-10-CM

## 2021-05-31 MED ORDER — DOCUSATE SODIUM 100 MG PO CAPS: 100.0000 mg | ORAL_CAPSULE | Freq: Two times a day (BID) | ORAL | 2 refills | Status: DC | PRN

## 2021-05-31 MED ORDER — CYCLOBENZAPRINE HCL 5 MG PO TABS: 5.0000 mg | ORAL_TABLET | Freq: Three times a day (TID) | ORAL | 2 refills | Status: DC | PRN

## 2021-05-31 NOTE — Assessment & Plan Note (Signed)
Encourage smoking cessation Continue Trelegy and Albuterol

## 2021-05-31 NOTE — Assessment & Plan Note (Signed)
Controlled off meds  Will monitor 

## 2021-05-31 NOTE — Assessment & Plan Note (Signed)
C-Met and lipid profile today Continue atorvastatin, Plavix and aspirin Encouraged her to consume a low-fat diet

## 2021-05-31 NOTE — Progress Notes (Signed)
Subjective:    Patient ID: Kendra [name], female    DOB: [birth year], 75 y.o.   MRN: [id number]  HPI  Patient presents to clinic today for 28-month follow-up of chronic conditions.  HTN: Her BP today is 139/76.  She is not taking any antihypertensive medication at this time.  ECG from 04/2021 reviewed.  HLD with Aortic Atherosclerosis/CAD status post CVA: Residual left-sided weakness.  Her last LDL was 26, triglycerides 70, 12/2020.  She denies myalgias on Atorvastatin, Plavix and Aspirin.  She does not consume a low-fat diet.  DM2: Her last A1c was 5.8%, 12/2020.  She is not taking any oral diabetic medication at this time.  She does not check her sugars.  She checks her feet routinely.  Her last eye exam was in 2021.  Flu 05/2019.  Pneumovax 07/2018.  Prevnar 11/2014.  COVID: Sport and exercise psychologist.  COPD: She reports chronic cough but denies shortness of breath.  She is taking Trelegy and Albuterol as prescribed.  There are no PFTs on file.  GERD: Triggered by spicy foods.  She is not taking any oral antacid medication at this time.  There is no upper GI on file.  OA: Mainly in her back.  She takes Flexeril as needed with good relief of symptoms.  Osteopenia: Her last bone density was more than 5 years ago.  She is not currently taking Calcium and Vitamin D.  She tries to get weightbearing exercise daily.  Depression: Managed on Wellbutrin.  She is not currently seeing a therapist.  She denies anxiety, SI/HI.  Insomnia: She has difficulty falling asleep and staying asleep.  She is taking Trazodone as prescribed.  There is no sleep study on file.  Anemia: Her last H/H was 11.3/33.5, 04/2021.  She is not currently taking oral iron.  She follows with hematology.  Review of Systems  Past Medical History:  Diagnosis Date   Asthma    Back pain    Cataract    Cervical disc disorder    COPD (chronic obstructive pulmonary disease) (HCC)    Depression    Diabetes (HCC)    type II   Diabetic  neuropathy (HCC)    Dyspnea    with exertion   GERD (gastroesophageal reflux disease)    History of kidney stones    per patient "can't remember the year"   Hyperlipidemia    Hypertension    per patient "take meds for it"   Insomnia    Neuropathy    Osteoarthritis    Osteopenia    Pneumonia 2018   per patient   Reflux    Rheumatoid arthritis ([place])    Stroke ([place]) 2015   and again 2017  - Weakness in left leg, staggers w/ walking, vision    Tenosynovitis     Current Outpatient Medications  Medication Sig Dispense Refill   Acetaminophen (TYLENOL 8 HOUR ARTHRITIS PAIN PO) Take 2 capsules by mouth in the morning, at noon, in the evening, and at bedtime.     albuterol (VENTOLIN HFA) 108 (90 Base) MCG/ACT inhaler INHALE 1-2 PUFFS BY MOUTH EVERY 6 HOURS AS NEEDED WHEEZING/ SHORTNESS OF BREATH 8.5 g 1   aspirin 325 MG tablet Take 325 mg by mouth daily.     atorvastatin (LIPITOR) 40 MG tablet TAKE 1 TABLET BY MOUTH BEDTIME 90 tablet 0   Blood Glucose Monitoring Suppl (ONETOUCH VERIO) w/Device KIT USE AS DIRECTED 1 kit 0   buPROPion (WELLBUTRIN XL) 150 MG 24 hr tablet TAKE  1 TABLET BY MOUTH ONCE DAILY 90 tablet 0   clopidogrel (PLAVIX) 75 MG tablet Take 1 tablet by mouth daily.     docusate sodium (COLACE) 100 MG capsule Take 1 capsule (100 mg total) by mouth 2 (two) times daily as needed for mild constipation. 30 capsule 0   glucose blood (ONETOUCH VERIO) test strip Use as instructed 200 each 4   Lancet Devices (ONE TOUCH DELICA LANCING DEV) MISC 1 Device by Does not apply route 2 (two) times daily. 200 each 4   OneTouch Delica Lancets 09Q MISC 1 Device by Does not apply route 2 (two) times daily. 200 each 4   traZODone (DESYREL) 50 MG tablet Take 0.5-1 tablets (25-50 mg total) by mouth at bedtime as needed for sleep. 90 tablet 0   TRELEGY ELLIPTA 100-62.5-25 MCG/INH AEPB USE ONE INHALATION INTO THE LUNGS ONCE A DAY RINSE MOUTH AFTER EACH USE 60 each 1   No current facility-administered  medications for this visit.    Allergies  Allergen Reactions   Citalopram Nausea And Vomiting    Family History  Problem Relation Age of Onset   Stroke Father    Depression Father    Diabetes Mother    Hyperlipidemia Mother    Breast cancer Mother    Heart murmur Son    Heart murmur Son    Heart murmur Son     Social History   Socioeconomic History   Marital status: Widowed    Spouse name: Not on file   Number of children: Not on file   Years of education: Not on file   Highest education level: Not on file  Occupational History   Not on file  Tobacco Use   Smoking status: Some Days    Packs/day: 0.25    Years: 61.00    Pack years: 15.25    Types: Cigarettes   Smokeless tobacco: Former    Types: Snuff  Vaping Use   Vaping Use: Never used  Substance and Sexual Activity   Alcohol use: No   Drug use: No   Sexual activity: Never  Other Topics Concern   Not on file  Social History Narrative   Not on file   Social Determinants of Health   Financial Resource Strain: Low Risk    Difficulty of Paying Living Expenses: Not very hard  Food Insecurity: No Food Insecurity   Worried About Charity fundraiser in the Last Year: Never true   [place] in the Last Year: Never true  Transportation Needs: No Transportation Needs   Lack of Transportation (Medical): No   Lack of Transportation (Non-Medical): No  Physical Activity: Inactive   Days of Exercise per Week: 0 days   Minutes of Exercise per Session: 0 min  Stress: No Stress Concern Present   Feeling of Stress : Only a little  Social Connections: Socially Isolated   Frequency of Communication with Friends and Family: More than three times a week   Frequency of Social Gatherings with Friends and Family: More than three times a week   Attends Religious Services: Never   Marine scientist or Organizations: No   Attends Archivist Meetings: Never   Marital Status: Widowed  Human resources officer  Violence: Not At Risk   Fear of Current or Ex-Partner: No   Emotionally Abused: No   Physically Abused: No   Sexually Abused: No     Constitutional: Denies fever, malaise, fatigue, headache or abrupt weight changes.  HEENT: Denies eye pain, eye redness, ear pain, ringing in the ears, wax buildup, runny nose, nasal congestion, bloody nose, or sore throat. Respiratory: Patient reports chronic cough.  Denies difficulty breathing, shortness of breath, or sputum production.   Cardiovascular: Denies chest pain, chest tightness, palpitations or swelling in the hands or feet.  Gastrointestinal: Denies abdominal pain, bloating, constipation, diarrhea or blood in the stool.  GU: Denies urgency, frequency, pain with urination, burning sensation, blood in urine, odor or discharge. Musculoskeletal: Pt reports joint pain. Denies decrease in range of motion, difficulty with gait, muscle pain or joint swelling.  Skin: Denies redness, rashes, lesions or ulcercations.  Neurological: Pt reports insomnia. Denies dizziness, difficulty with memory, difficulty with speech or problems with balance and coordination.  Psych: Pt has a history of depression. Denies anxiety, SI/HI.  No other specific complaints in a complete review of systems (except as listed in HPI above).     Objective:   Physical Exam  BP 139/76 (BP Location: Right Arm, Patient Position: Sitting, Cuff Size: Normal)   Pulse 88   Temp 97.7 F (36.5 C) (Temporal)   Resp 17   Ht 5\' 4"  (1.626 m)   Wt 129 lb 9.6 oz (58.8 kg)   SpO2 100%   BMI 22.25 kg/m   Wt Readings from Last 3 Encounters:  04/20/21 123 lb (55.8 kg)  02/22/21 124 lb 9.6 oz (56.5 kg)  02/08/21 122 lb 14.4 oz (55.7 kg)    General: Appears her stated age, well developed, well nourished in NAD. Skin: Warm, dry and intact. No ulcerations noted. HEENT: Head: normal shape and size; Eyes: sclera white and EOMs intact;  Neck:  Neck supple, trachea midline. No masses, lumps or  thyromegaly present.  Cardiovascular: Normal rate and rhythm. S1,S2 noted.  No murmur, rubs or gallops noted. No JVD or BLE edema. No carotid bruits noted. Pulmonary/Chest: Normal effort and coarse breath sounds. No respiratory distress. No wheezes, rales or ronchi noted.  Abdomen: Soft and nontender. Normal bowel sounds. No distention or masses noted.  Musculoskeletal: . No difficulty with gait.  Neurological: Alert and oriented.  Psychiatric: Mood and affect normal. Behavior is normal. Judgment and thought content normal.   BMET    Component Value Date/Time   NA 138 04/20/2021 2155   NA 141 01/18/2016 1150   NA 138 09/01/2014 2130   K 3.9 04/20/2021 2155   K 3.5 09/01/2014 2130   CL 104 04/20/2021 2155   CL 104 09/01/2014 2130   CO2 25 04/20/2021 2155   CO2 28 09/01/2014 2130   GLUCOSE 175 (H) 04/20/2021 2155   GLUCOSE 287 (H) 09/01/2014 2130   BUN 18 04/20/2021 2155   BUN 12 01/18/2016 1150   BUN 14 09/01/2014 2130   CREATININE 0.91 04/20/2021 2155   CREATININE 0.82 12/21/2020 1134   CALCIUM 9.5 04/20/2021 2155   CALCIUM 8.9 09/01/2014 2130   GFRNONAA >60 04/20/2021 2155   GFRNONAA 70 06/14/2020 0929   GFRAA 82 06/14/2020 0929    Lipid Panel     Component Value Date/Time   CHOL 92 12/21/2020 1134   CHOL 177 06/07/2015 1215   CHOL 141 09/02/2014 0440   TRIG 70 12/21/2020 1134   TRIG 155 09/02/2014 0440   HDL 51 12/21/2020 1134   HDL 56 06/07/2015 1215   HDL 37 (L) 09/02/2014 0440   CHOLHDL 1.8 12/21/2020 1134   VLDL 16 10/17/2019 0606   VLDL 31 09/02/2014 0440   LDLCALC 26 12/21/2020 1134  LDLCALC 73 09/02/2014 0440    CBC    Component Value Date/Time   WBC 5.9 04/20/2021 2155   RBC 3.45 (L) 04/20/2021 2155   HGB 11.3 (L) 04/20/2021 2155   HGB 12.7 09/01/2014 2130   HCT 33.5 (L) 04/20/2021 2155   HCT 37.9 09/01/2014 2130   PLT 230 04/20/2021 2155   PLT 260 09/05/2014 0517   MCV 97.1 04/20/2021 2155   MCV 92 09/01/2014 2130   MCH 32.8 04/20/2021 2155    MCHC 33.7 04/20/2021 2155   RDW 13.8 04/20/2021 2155   RDW 13.9 09/01/2014 2130   LYMPHSABS 1.5 02/01/2021 1310   MONOABS 0.2 02/01/2021 1310   EOSABS 0.2 02/01/2021 1310   BASOSABS 0.0 02/01/2021 1310    Hgb A1C Lab Results  Component Value Date   HGBA1C 5.8 (A) 12/21/2020           Assessment & Plan:    Kendra Silversmith, NP This visit occurred during the SARS-CoV-2 public health emergency.  Safety protocols were in place, including screening questions prior to the visit, additional usage of staff PPE, and extensive cleaning of exam room while observing appropriate contact time as indicated for disinfecting solutions.

## 2021-05-31 NOTE — Assessment & Plan Note (Signed)
No issues off medications Will monitor

## 2021-05-31 NOTE — Assessment & Plan Note (Signed)
Continue atorvastatin, Plavix and aspirin Encouraged her to consume a low-fat diet

## 2021-05-31 NOTE — Assessment & Plan Note (Signed)
Encouraged her to take calcium and vitamin D OTC Encourage daily weightbearing exercise 

## 2021-05-31 NOTE — Assessment & Plan Note (Signed)
Stable on Wellbutrin Support offered

## 2021-05-31 NOTE — Assessment & Plan Note (Signed)
A1c today Encouraged her to consume a low-carb diet Encourage routine eye exams Flu shot today Pneumonia vaccines UTD Encouraged her to get a COVID booster Encourage routine foot exams Advised her to schedule an appointment for an eye exam

## 2021-05-31 NOTE — Assessment & Plan Note (Signed)
Continue Trazodone as needed

## 2021-05-31 NOTE — Assessment & Plan Note (Signed)
Flexeril refilled today

## 2021-05-31 NOTE — Patient Instructions (Signed)
Managing the Challenge of Quitting Smoking Quitting smoking is a physical and mental challenge. You will face cravings, withdrawal symptoms, and temptation. Before quitting, work with your health care provider to make a plan that can help you manage quitting. Preparation canhelp you quit and keep you from giving in. How to manage lifestyle changes Managing stress Stress can make you want to smoke, and wanting to smoke may cause stress. It is important to find ways to manage your stress. You might try some of the following: Practice relaxation techniques. Breathe slowly and deeply, in through your nose and out through your mouth. Listen to music. Soak in a bath or take a shower. Imagine a peaceful place or vacation. Get some support. Talk with family or friends about your stress. Join a support group. Talk with a counselor or therapist. Get some physical activity. Go for a walk, run, or bike ride. Play a favorite sport. Practice yoga.  Medicines Talk with your health care provider about medicines that might help you dealwith cravings and make quitting easier for you. Relationships Social situations can be difficult when you are quitting smoking. To manage this, you can: Avoid parties and other social situations where people might be smoking. Avoid alcohol. Leave right away if you have the urge to smoke. Explain to your family and friends that you are quitting smoking. Ask for support and let them know you might be a bit grumpy. Plan activities where smoking is not an option. General instructions Be aware that many people gain weight after they quit smoking. However, not everyone does. To keep from gaining weight, have a plan in place before you quit and stick to the plan after you quit. Your plan should include: Having healthy snacks. When you have a craving, it may help to: Eat popcorn, carrots, celery, or other cut vegetables. Chew sugar-free gum. Changing how you eat. Eat small  portion sizes at meals. Eat 4-6 small meals throughout the day instead of 1-2 large meals a day. Be mindful when you eat. Do not watch television or do other things that might distract you as you eat. Exercising regularly. Make time to exercise each day. If you do not have time for a long workout, do short bouts of exercise for 5-10 minutes several times a day. Do some form of strengthening exercise, such as weight lifting. Do some exercise that gets your heart beating and causes you to breathe deeply, such as walking fast, running, swimming, or biking. This is very important. Drinking plenty of water or other low-calorie or no-calorie drinks. Drink 6-8 glasses of water daily.  How to recognize withdrawal symptoms Your body and mind may experience discomfort as you try to get used to not having nicotine in your system. These effects are called withdrawal symptoms. They may include: Feeling hungrier than normal. Having trouble concentrating. Feeling irritable or restless. Having trouble sleeping. Feeling depressed. Craving a cigarette. To manage withdrawal symptoms: Avoid places, people, and activities that trigger your cravings. Remember why you want to quit. Get plenty of sleep. Avoid coffee and other caffeinated drinks. These may worsen some of your symptoms. These symptoms may surprise you. But be assured that they are normal to havewhen quitting smoking. How to manage cravings Come up with a plan for how to deal with your cravings. The plan should include the following: A definition of the specific situation you want to deal with. An alternative action you will take. A clear idea for how this action will help. The   name of someone who might help you with this. Cravings usually last for 5-10 minutes. Consider taking the following actions to help you with your plan to deal with cravings: Keep your mouth busy. Chew sugar-free gum. Suck on hard candies or a straw. Brush your  teeth. Keep your hands and body busy. Change to a different activity right away. Squeeze or play with a ball. Do an activity or a hobby, such as making bead jewelry, practicing needlepoint, or working with wood. Mix up your normal routine. Take a short exercise break. Go for a quick walk or run up and down stairs. Focus on doing something kind or helpful for someone else. Call a friend or family member to talk during a craving. Join a support group. Contact a quitline. Where to find support To get help or find a support group: Call the National Cancer Institute's Smoking Quitline: 1-800-QUIT NOW (784-8669) Visit the website of the Substance Abuse and Mental Health Services Administration: www.samhsa.gov Text QUIT to SmokefreeTXT: 478848 Where to find more information Visit these websites to find more information on quitting smoking: National Cancer Institute: www.smokefree.gov American Lung Association: www.lung.org American Cancer Society: www.cancer.org Centers for Disease Control and Prevention: www.cdc.gov American Heart Association: www.heart.org Contact a health care provider if: You want to change your plan for quitting. The medicines you are taking are not helping. Your eating feels out of control or you cannot sleep. Get help right away if: You feel depressed or become very anxious. Summary Quitting smoking is a physical and mental challenge. You will face cravings, withdrawal symptoms, and temptation to smoke again. Preparation can help you as you go through these challenges. Try different techniques to manage stress, handle social situations, and prevent weight gain. You can deal with cravings by keeping your mouth busy (such as by chewing gum), keeping your hands and body busy, calling family or friends, or contacting a quitline for people who want to quit smoking. You can deal with withdrawal symptoms by avoiding places where people smoke, getting plenty of rest, and  avoiding drinks with caffeine. This information is not intended to replace advice given to you by your health care provider. Make sure you discuss any questions you have with your healthcare provider. Document Revised: 06/24/2019 Document Reviewed: 06/24/2019 Elsevier Patient Education  2022 Elsevier Inc.  

## 2021-05-31 NOTE — Assessment & Plan Note (Signed)
CBC today.  

## 2021-06-01 LAB — COMPLETE METABOLIC PANEL WITH GFR
AG Ratio: 1.7 (calc) (ref 1.0–2.5)
ALT: 15 U/L (ref 6–29)
AST: 14 U/L (ref 10–35)
Albumin: 4.4 g/dL (ref 3.6–5.1)
Alkaline phosphatase (APISO): 86 U/L (ref 37–153)
BUN: 11 mg/dL (ref 7–25)
CO2: 29 mmol/L (ref 20–32)
Calcium: 9.9 mg/dL (ref 8.6–10.4)
Chloride: 104 mmol/L (ref 98–110)
Creat: 0.72 mg/dL (ref 0.60–1.00)
Globulin: 2.6 g/dL (calc) (ref 1.9–3.7)
Glucose, Bld: 100 mg/dL (ref 65–139)
Potassium: 4.1 mmol/L (ref 3.5–5.3)
Sodium: 140 mmol/L (ref 135–146)
Total Bilirubin: 0.3 mg/dL (ref 0.2–1.2)
Total Protein: 7 g/dL (ref 6.1–8.1)
eGFR: 88 mL/min/{1.73_m2} (ref >=60)

## 2021-06-01 LAB — MICROALBUMIN / CREATININE URINE RATIO
Creatinine, Urine: 89 mg/dL (ref 20–275)
Microalb Creat Ratio: 24 mcg/mg creat (ref <30)
Microalb, Ur: 2.1 mg/dL

## 2021-06-01 LAB — HEMOGLOBIN A1C
Hgb A1c MFr Bld: 6.7 % of total Hgb — ABNORMAL HIGH (ref <5.7)
Mean Plasma Glucose: 146 mg/dL
eAG (mmol/L): 8.1 mmol/L

## 2021-06-06 ENCOUNTER — Inpatient Hospital Stay: Payer: Medicare Other

## 2021-06-06 ENCOUNTER — Inpatient Hospital Stay: Payer: Medicare Other | Attending: Oncology

## 2021-06-06 ENCOUNTER — Other Ambulatory Visit: Payer: Medicare Other

## 2021-06-06 DIAGNOSIS — Z79899 Other long term (current) drug therapy: Secondary | ICD-10-CM | POA: Insufficient documentation

## 2021-06-06 DIAGNOSIS — D649 Anemia, unspecified: Secondary | ICD-10-CM | POA: Diagnosis not present

## 2021-06-06 DIAGNOSIS — E538 Deficiency of other specified B group vitamins: Secondary | ICD-10-CM | POA: Insufficient documentation

## 2021-06-06 DIAGNOSIS — R803 Bence Jones proteinuria: Secondary | ICD-10-CM | POA: Diagnosis not present

## 2021-06-06 DIAGNOSIS — Z7982 Long term (current) use of aspirin: Secondary | ICD-10-CM | POA: Insufficient documentation

## 2021-06-06 LAB — COMPREHENSIVE METABOLIC PANEL
ALT: 19 U/L (ref 0–44)
AST: 17 U/L (ref 15–41)
Albumin: 4.2 g/dL (ref 3.5–5.0)
Alkaline Phosphatase: 83 U/L (ref 38–126)
Anion gap: 6 (ref 5–15)
BUN: 24 mg/dL — ABNORMAL HIGH (ref 8–23)
CO2: 30 mmol/L (ref 22–32)
Calcium: 9.6 mg/dL (ref 8.9–10.3)
Chloride: 103 mmol/L (ref 98–111)
Creatinine, Ser: 0.8 mg/dL (ref 0.44–1.00)
GFR, Estimated: >60 mL/min (ref >60)
Glucose, Bld: 126 mg/dL — ABNORMAL HIGH (ref 70–99)
Potassium: 4.1 mmol/L (ref 3.5–5.1)
Sodium: 139 mmol/L (ref 135–145)
Total Bilirubin: 0.7 mg/dL (ref 0.3–1.2)
Total Protein: 7.6 g/dL (ref 6.5–8.1)

## 2021-06-06 LAB — CBC WITH DIFFERENTIAL/PLATELET
Abs Immature Granulocytes: 0.01 10*3/uL (ref 0.00–0.07)
Basophils Absolute: 0 10*3/uL (ref 0.0–0.1)
Basophils Relative: 1 %
Eosinophils Absolute: 0.1 10*3/uL (ref 0.0–0.5)
Eosinophils Relative: 2 %
HCT: 35.7 % — ABNORMAL LOW (ref 36.0–46.0)
Hemoglobin: 11.6 g/dL — ABNORMAL LOW (ref 12.0–15.0)
Immature Granulocytes: 0 %
Lymphocytes Relative: 31 %
Lymphs Abs: 1.5 10*3/uL (ref 0.7–4.0)
MCH: 31.7 pg (ref 26.0–34.0)
MCHC: 32.5 g/dL (ref 30.0–36.0)
MCV: 97.5 fL (ref 80.0–100.0)
Monocytes Absolute: 0.3 10*3/uL (ref 0.1–1.0)
Monocytes Relative: 6 %
Neutro Abs: 2.9 10*3/uL (ref 1.7–7.7)
Neutrophils Relative %: 60 %
Platelets: 228 10*3/uL (ref 150–400)
RBC: 3.66 MIL/uL — ABNORMAL LOW (ref 3.87–5.11)
RDW: 12.8 % (ref 11.5–15.5)
WBC: 4.9 10*3/uL (ref 4.0–10.5)
nRBC: 0 % (ref 0.0–0.2)

## 2021-06-06 LAB — VITAMIN B12: Vitamin B-12: 564 pg/mL (ref 180–914)

## 2021-06-07 ENCOUNTER — Ambulatory Visit: Payer: Self-pay

## 2021-06-07 LAB — KAPPA/LAMBDA LIGHT CHAINS
Kappa free light chain: 53.6 mg/L — ABNORMAL HIGH (ref 3.3–19.4)
Kappa, lambda light chain ratio: 4.83 — ABNORMAL HIGH (ref 0.26–1.65)
Lambda free light chains: 11.1 mg/L (ref 5.7–26.3)

## 2021-06-07 NOTE — Telephone Encounter (Signed)
Pt stated the day she received her flu shot , her right arm began to hurt. Now she cannot move the arm without severe pain. Pt stated she is weak using the arm and in severe pain. Advised pt to go to ED. Unsure pt will go. Care advice given and pt verbalized understanding.      Reason for Disposition  [1] SEVERE pain AND [2] not improved 2 hours after pain medicine  Answer Assessment - Initial Assessment Questions 1. ONSET: "When did the pain start?"     Last week 2. LOCATION: "Where is the pain located?"     Shoulder and wrist a soreness to right arm 3. PAIN: "How bad is the pain?" (Scale 1-10; or mild, moderate, severe)   - MILD (1-3): doesn't interfere with normal activities   - MODERATE (4-7): interferes with normal activities (e.g., work or school) or awakens from sleep   - SEVERE (8-10): excruciating pain, unable to do any normal activities, unable to hold a cup of water     9-severe 4. WORK OR EXERCISE: "Has there been any recent work or exercise that involved this part of the body?"     Flu shot  5. CAUSE: "What do you think is causing the arm pain?"     Flu shot 6. OTHER SYMPTOMS: "Do you have any other symptoms?" (e.g., neck pain, swelling, rash, fever, numbness, weakness)     Weakness, pain, hurts to move it 7. PREGNANCY: "Is there any chance you are pregnant?" "When was your last menstrual period?"     N/a  Protocols used: Arm Pain-A-AH

## 2021-06-08 ENCOUNTER — Ambulatory Visit: Payer: Medicare Other | Admitting: Cardiovascular Disease

## 2021-06-08 NOTE — Progress Notes (Deleted)
NO SHOW

## 2021-06-09 ENCOUNTER — Encounter: Payer: Self-pay | Admitting: Cardiovascular Disease

## 2021-06-09 ENCOUNTER — Telehealth: Payer: Self-pay

## 2021-06-09 ENCOUNTER — Telehealth: Payer: Medicare Other | Admitting: General Practice

## 2021-06-09 ENCOUNTER — Ambulatory Visit (INDEPENDENT_AMBULATORY_CARE_PROVIDER_SITE_OTHER): Payer: Medicare Other

## 2021-06-09 DIAGNOSIS — I152 Hypertension secondary to endocrine disorders: Secondary | ICD-10-CM

## 2021-06-09 DIAGNOSIS — R296 Repeated falls: Secondary | ICD-10-CM

## 2021-06-09 DIAGNOSIS — E1169 Type 2 diabetes mellitus with other specified complication: Secondary | ICD-10-CM

## 2021-06-09 DIAGNOSIS — E1159 Type 2 diabetes mellitus with other circulatory complications: Secondary | ICD-10-CM

## 2021-06-09 DIAGNOSIS — E785 Hyperlipidemia, unspecified: Secondary | ICD-10-CM

## 2021-06-09 DIAGNOSIS — F3341 Major depressive disorder, recurrent, in partial remission: Secondary | ICD-10-CM

## 2021-06-09 NOTE — Chronic Care Management (AMB) (Signed)
Chronic Care Management   CCM RN Visit Note  06/09/2021 Name: Kendra [name] MRN: [id number] DOB: 1945/11/18  Subjective: Kendra [name] is a 75 y.o. year old female who is a primary care patient of Kendra Fenton, NP. The care management team was consulted for assistance with disease management and care coordination needs.    Engaged with patient by telephone for follow up visit in response to provider referral for case management and/or care coordination services.   Consent to Services:  The patient was given information about Chronic Care Management services, agreed to services, and gave verbal consent prior to initiation of services.  Please see initial visit note for detailed documentation.   Patient agreed to services and verbal consent obtained.   Assessment: Review of patient past medical history, allergies, medications, health status, including review of consultants reports, laboratory and other test data, was performed as part of comprehensive evaluation and provision of chronic care management services.   SDOH (Social Determinants of Health) assessments and interventions performed:    CCM Care Plan  Allergies  Allergen Reactions   Citalopram Nausea And Vomiting    Outpatient Encounter Medications as of 06/09/2021  Medication Sig   Acetaminophen (TYLENOL 8 HOUR ARTHRITIS PAIN PO) Take 2 capsules by mouth in the morning, at noon, in the evening, and at bedtime.   albuterol (VENTOLIN HFA) 108 (90 Base) MCG/ACT inhaler INHALE 1-2 PUFFS BY MOUTH EVERY 6 HOURS AS NEEDED WHEEZING/ SHORTNESS OF BREATH   aspirin 325 MG tablet Take 325 mg by mouth daily.   atorvastatin (LIPITOR) 40 MG tablet TAKE 1 TABLET BY MOUTH BEDTIME   Blood Glucose Monitoring Suppl (ONETOUCH VERIO) w/Device KIT USE AS DIRECTED   buPROPion (WELLBUTRIN XL) 150 MG 24 hr tablet TAKE 1 TABLET BY MOUTH ONCE DAILY   clopidogrel (PLAVIX) 75 MG tablet Take 1 tablet by mouth daily.   cyclobenzaprine (FLEXERIL) 5  MG tablet Take 1 tablet (5 mg total) by mouth 3 (three) times daily as needed for muscle spasms.   docusate sodium (COLACE) 100 MG capsule Take 1 capsule (100 mg total) by mouth 2 (two) times daily as needed for mild constipation.   glucose blood (ONETOUCH VERIO) test strip Use as instructed   Lancet Devices (ONE TOUCH DELICA LANCING DEV) MISC 1 Device by Does not apply route 2 (two) times daily.   OneTouch Delica Lancets 00P MISC 1 Device by Does not apply route 2 (two) times daily.   traZODone (DESYREL) 50 MG tablet Take 0.5-1 tablets (25-50 mg total) by mouth at bedtime as needed for sleep.   TRELEGY ELLIPTA 100-62.5-25 MCG/INH AEPB USE ONE INHALATION INTO THE LUNGS ONCE A DAY RINSE MOUTH AFTER EACH USE   No facility-administered encounter medications on file as of 06/09/2021.    Patient Active Problem List   Diagnosis Date Noted   Aortic atherosclerosis ([place]) 05/31/2021   COPD (chronic obstructive pulmonary disease) ([place]) 05/31/2021   Anemia 05/31/2021   Hemiparesis affecting left side as late effect of stroke ([place]) 02/02/2021   Recurrent major depressive disorder, in partial remission ([place]) 02/02/2021   Carotid artery stenosis 12/30/2019   Hypertension associated with diabetes ([place]) 12/19/2018   Diabetes mellitus type 2, controlled, with complications ([place]) 23/30/0762   Insomnia 03/30/2015   Hyperlipidemia associated with type 2 diabetes mellitus ([place]) 03/30/2015   Gastro-esophageal reflux disease without esophagitis 03/30/2015   History of cerebrovascular accident with residual deficit 03/30/2015   Osteoarthritis 03/30/2015   Osteopenia 03/30/2015   B12 deficiency  03/30/2015    Conditions to be addressed/monitored:HTN, HLD, Depression, and Falls prevention and safety  Care Plan : RNCM: Hypertension (Adult)  Updates made by Vanita Ingles, RN since 06/09/2021 12:00 AM     Problem: RNCM: Hypertension (Hypertension)   Priority: Medium     Long-Range Goal: RNCM: Hypertension  Monitored   Start Date: 01/24/2021  Expected End Date: 02/28/2022  This Visit's Progress: On track  Recent Progress: On track  Priority: Medium  Note:   Objective:  Last practice recorded BP readings:  BP Readings from Last 3 Encounters:  05/31/21 139/76  04/21/21 128/68  02/22/21 129/73   Most recent eGFR/CrCl: No results found for: EGFR  No components found for: CRCL Current Barriers:  Knowledge Deficits related to basic understanding of hypertension pathophysiology and self care management Knowledge Deficits related to understanding of medications prescribed for management of hypertension Transportation barriers Cognitive Deficits Limited Social Designer, multimedia.  Unable to independently HTN Lacks social connections Unable to perform IADLs independently Does not contact provider office for questions/concerns Case Manager Clinical Goal(s):  patient will verbalize understanding of plan for hypertension management patient will attend all scheduled medical appointments: 08-08-2021 at 10 am patient will demonstrate improved adherence to prescribed treatment plan for hypertension as evidenced by taking all medications as prescribed, monitoring and recording blood pressure as directed, adhering to low sodium/DASH diet patient will demonstrate improved health management independence as evidenced by checking blood pressure as directed and notifying PCP if SBP>160 or DBP > 90, taking all medications as prescribe, and adhering to a low sodium diet as discussed. patient will verbalize basic understanding of hypertension disease process and self health management plan as evidenced by compliance with medications, compliance with heart healthy/ADA diet and working with the CCM team to optimize health and well being  Interventions:  Collaboration with Kendra Fenton, NP regarding development and update of comprehensive plan of care as evidenced by provider attestation and  co-signature Inter-disciplinary care team collaboration (see longitudinal plan of care) UNABLE to independently:manage HTN Evaluation of current treatment plan related to hypertension self management and patient's adherence to plan as established by provider. 04-14-2021: The patient just woke up at the time of the call. The patient states that her pressures are doing good but she did not have her log near her and was still in the bed. Denies any issues with taking medications and is eating well. The patient states she has actually gained weight. Encouraged the patient to continue checking blood pressures on a consistent bases. 06-09-2021: The patient states the last time she took her blood pressure was on 06-05-2021 and it was 132/89 with pulse of 90.  The patient states that she has been stressed out. Was in the ER on 04-20-2021 and she will see the cardiologist tomorrow. She feels the biggest issue causing her CP is stress from being concerned over her sons. Allowed the patient to express her feels. Endorses taking her medications as directed. Ask the patient to check her blood pressures more frequently and record. The patient verbalized understanding.  Provided education to patient re: stroke prevention, s/s of heart attack and stroke, DASH diet, complications of uncontrolled blood pressure Reviewed medications with patient and discussed importance of compliance. 06-09-2021: The patient states compliance with her medications.  Discussed plans with patient for ongoing care management follow up and provided patient with direct contact information for care management team Advised patient, providing education and rationale, to monitor blood pressure daily and record, calling  PCP for findings outside established parameters.  Reviewed scheduled/upcoming provider appointments including: 08-08-2021 at 10 am Self-Care Activities: - Self administers medications as prescribed Attends all scheduled provider  appointments Calls provider office for new concerns, questions, or BP outside discussed parameters Checks BP and records as discussed Follows a low sodium diet/DASH diet Patient Goals: - check blood pressure daily - choose a place to take my blood pressure (home, clinic or office, retail store) - write blood pressure results in a log or diary - agree on reward when goals are met - agree to work together to make changes - ask questions to understand - have a family meeting to talk about healthy habits - learn about high blood pressure  Follow Up Plan: Face to Face appointment with care management team member scheduled for:  08-08-2021 at 0945  am    Care Plan : RNCM: HLD Management  Updates made by Vanita Ingles, RN since 06/09/2021 12:00 AM     Problem: RNCM: Health Promotion or Disease Self-Management (General Plan of Care)   Priority: Medium     Long-Range Goal: RNCM: HLD Self-Management Plan Developed   Start Date: 01/24/2021  Expected End Date: 02/28/2022  This Visit's Progress: On track  Recent Progress: On track  Priority: Medium  Note:   Current Barriers:  Poorly controlled hyperlipidemia, complicated by HTN, smoker, COPd Current antihyperlipidemic regimen: Lipitor 49m daily Most recent lipid panel:     Component Value Date/Time   CHOL 92 12/21/2020 1134   CHOL 177 06/07/2015 1215   CHOL 141 09/02/2014 0440   TRIG 70 12/21/2020 1134   TRIG 155 09/02/2014 0440   HDL 51 12/21/2020 1134   HDL 56 06/07/2015 1215   HDL 37 (L) 09/02/2014 0440   CHOLHDL 1.8 12/21/2020 1134   VLDL 16 10/17/2019 0606   VLDL 31 09/02/2014 0440   LDLCALC 26 12/21/2020 1134   LDLCALC 73 09/02/2014 0440   ASCVD risk enhancing conditions: age >>45 DM, HTN, current smoker Unable to independently HLD Lacks social connections Unable to perform IADLs independently Does not contact provider office for questions/concerns RN Care Manager Clinical Goal(s):  patient will work with RConsulting civil engineer providers, and care team towards execution of optimized self-health management plan patient will verbalize understanding of plan for HLD [facility], and [facility]cp to address needs related to HLD patient will take all medications exactly as prescribed and will call provider for medication related questions patient will attend all scheduled medical appointments: 08-08-2021 at 10 am patient will demonstrate improved adherence to prescribed treatment plan for HLD patient will demonstrate improved health management independence patient will demonstrate understanding of rationale for each prescribed medication the patient will demonstrate ongoing self health care management ability Interventions: Collaboration with BJearld Fenton NP regarding development and update of comprehensive plan of care as evidenced by provider attestation and co-signature Inter-disciplinary care team collaboration (see longitudinal plan of care) Medication review performed; medication list updated in electronic medical record.  Inter-disciplinary care team collaboration (see longitudinal plan of care) Referred to pharmacy team for assistance with HLD medication management Evaluation of current treatment plan related to HLD  and patient's adherence to plan as established by provider. 06-09-2021: The patient denies any new concerns with HLD health. States she is eating good and monitoring her salt and fats.  Advised patient to call for changes in conditions, questions, or new concerns Reviewed medications with patient and discussed compliance. The patient states compliance with  medications  Reviewed scheduled/upcoming provider appointments including: 08-08-2021 at 10 am Discussed plans with patient for ongoing care management follow up and provided patient with direct contact information for care management team Patient Goals/Self-Care Activities: - call for medicine refill 2 or 3 days before it  runs out - call if I am sick and can't take my medicine - keep a list of all the medicines I take; vitamins and herbals too - learn to read medicine labels - use a pillbox to sort medicine - use an alarm clock or phone to remind me to take my medicine - change to whole grain breads, cereal, pasta - drink 6 to 8 glasses of water each day - eat 3 to 5 servings of fruits and vegetables each day - eat 5 or 6 small meals each day - eat fish at least once per week - fill half the plate with nonstarchy vegetables - join a weight loss program - limit fast food meals to no more than 1 per week - manage portion size - prepare main meal at home 3 to 5 days each week - read food labels for fat, fiber, carbohydrates and portion size - reduce red meat to 2 to 3 times a week - be open to making changes - I can manage, know and watch for signs of a heart attack - if I have chest pain, call for help - learn about small changes that will make a big difference - learn my personal risk factors  Follow Up Plan: Face to Face appointment with care management team member scheduled for:  08-08-2021 at 945 am      Care Plan : RNCM: Fall Risk (Adult)  Updates made by Vanita Ingles, RN since 06/09/2021 12:00 AM     Problem: Fall Risk      Long-Range Goal: RNCM: Absence of Fall and Fall-Related Injury   Start Date: 04/14/2021  Expected End Date: 04/09/2022  This Visit's Progress: On track  Recent Progress: On track  Priority: High  Note:   Current Barriers:  Knowledge Deficits related to fall precautions in patient with  Decreased adherence to prescribed treatment for fall prevention Unable to independently manage falls and safety in patient with frequent falls, ER visit in May of2022 Lacks social connections Unable to perform IADLs independently Does not contact provider office for questions/concerns Frequent falls- last fall last week 04-04-2021 week Knowledge Deficits related to falls prevention  and safety in patient with multiple chronic conditions  Care Coordination needs related to the request for a cane  in a patient with frequent falls  Chronic Disease Management support and education needs related to frequent falls in a patient with high fall risk with most recent fall last week Lacks caregiver support.  Film/video editor.  Literacy barriers Transportation barriers Non-adherence to prescribed medication regimen High Fall risk patient  Clinical Goal(s):  patient will demonstrate improved adherence to prescribed treatment plan for decreasing falls as evidenced by patient reporting and review of EMR patient will verbalize using fall risk reduction strategies discussed patient will not experience additional falls patient will verbalize understanding of plan for fall prevention and safety patient will work with [facility], CCM team, and pcp  to address needs related to falls prevention and safety and the need for a cane to assist with ambulation patient will demonstrate a decrease in fall exacerbations patient will take all medications exactly as prescribed and will call provider for medication related questions patient will attend all scheduled medical appointments:  08-08-2021 at 10 am patient will demonstrate improved adherence to prescribed treatment plan for falls prevention and safety  patient will demonstrate improved health management independence patient will verbalize basic understanding of multiple chronic conditions increasing risk of falls  disease process and self health management plan patient will demonstrate understanding of rationale for each prescribed medication the patient will demonstrate ongoing self health care management ability Interventions:  Collaboration with Kendra Fenton, NP regarding development and update of comprehensive plan of care as evidenced by provider attestation and co-signature Inter-disciplinary care team collaboration (see longitudinal  plan of care) Provided written and verbal education re: Potential causes of falls and Fall prevention strategies Reviewed medications and discussed potential side effects of medications such as dizziness and frequent urination Assessed for s/s of orthostatic hypotension. 06-09-2021: Denies any issues with orthostatic hypotension Assessed for falls since last encounter. 04-14-2021- the patient states that she had a fall last week when it was raining and she had on slippery shoes. She hit her arm, did not go to the ER. The patient had a fall in May with injury and broken ribs with ER visit. 06-09-2021: Denies any new falls. States she is being careful.  Assessed patients knowledge of fall risk prevention secondary to previously provided education. Assessed working status of life alert bracelet and patient adherence Provided patient information for fall alert systems Evaluation of current treatment plan related to falls prevention and safety  and patient's adherence to plan as established by provider. 06-09-2021: The patient states she is doing well with safety. Denies any new falls. States her right arm is hurting where she got the flu vaccine and she can hardly move it. She states that it is not getting better. Agrees to come into the office to be seen. Will have office staff call patient to set up an appointment for evaluation. Will continue to monitor.  Advised patient to call the office for new falls or injuries  Provided education to patient re: falls prevention and safety and working with the CCM team to optimize health and well being and prevent falls. The patient has a walker but states that it is "too big" and it causes her to trip up. The patient thinks a cane will be more helpful.  Collaborated with pcp regarding the patients request for a cane to use when ambulating  Provided patient with fall prevention  educational materials related to being a high fall risk and the need to be safe in the home  and preventing injuries from falls.  Reviewed scheduled/upcoming provider appointments including: 08-08-2021 at 10 am Discussed plans with patient for ongoing care management follow up and provided patient with direct contact information for care management team Self-Care Deficits:  Unable to independently manage falls and safety in patient with multiple chronic conditions.  Lacks social connections Unable to perform IADLs independently Does not contact provider office for questions/concerns Patient Goals:  - Utilize walker and will assist with obtaining a cane to use when ambulating  (assistive device) appropriately with all ambulation - De-clutter walkways - Change positions slowly - Wear secure fitting shoes at all times with ambulation - Utilize home lighting for dim lit areas - Demonstrate self and pet awareness at all times Follow Up Plan: Face to Face appointment with care management team member scheduled for:  08-08-2021 at 945 am    Care Plan : RNCM: Depression (Adult)  Updates made by Vanita Ingles, RN since 06/09/2021 12:00 AM     Problem: RNCM:  Symptoms (Depression)   Priority: High     Long-Range Goal: Symptoms Monitored and Managed   Start Date: 06/09/2021  Expected End Date: 06/09/2022  This Visit's Progress: On track  Priority: High  Note:   Current Barriers:  Ineffective Self Health Maintenance in a patient with Depression Unable to independently manage depression as evidence of stress and circumstances impacting her health and well being Lacks social connections Does not contact provider office for questions/concerns Clinical Goal(s):  Collaboration with Kendra Fenton, NP regarding development and update of comprehensive plan of care as evidenced by provider attestation and co-signature Inter-disciplinary care team collaboration (see longitudinal plan of care) patient will work with care management team to address care coordination and chronic disease  management needs related to [place] [facility] Substance Abuse Counseling Level of Care Concerns   Interventions:  Evaluation of current treatment plan related to Depression, Limited social support, Level of care concerns, Family and relationship dysfunction, and Substance abuse issues -  smoker   self-management and patient's adherence to plan as established by provider. 06-09-2021: The patient states that she is stressed out about her sons and that is likely the reason she has had chest pain and had to go to the ER and be evaluated. One of her sons is in prison and one has a "crazy" girlfriend and he keeps staying with her. He calls the patient when things go wrong. The patient states she wants to be there for her kids because their father wasn't. The patient talked about how she wants to help her kids. Reflective listening and support given. The patient knows she can talk to the CCM team about her concerns. Reviewed how stress impacts her health and well being. She states she is trying not to let things bother her. She will see the cardiologist on 06-10-2021.  Collaboration with Kendra Fenton, NP regarding development and update of comprehensive plan of care as evidenced by provider attestation       and co-signature Inter-disciplinary care team collaboration (see longitudinal plan of care) Discussed plans with patient for ongoing care management follow up and provided patient with direct contact information for care management team Self Care Activities:  Patient verbalizes understanding of plan to effectively manage depression and mental health  Self administers medications as prescribed Attends all scheduled provider appointments Calls pharmacy for medication refills Attends church or other social activities Performs ADL's independently Performs IADL's independently Calls provider office for new concerns or questions Patient Goals: - activity or  exercise based on tolerance encouraged - complementary therapy use encouraged - depression screen reviewed - emotional support provided - healthy lifestyle promoted - medication side effects monitored and managed - pain managed - participation in mental health treatment encouraged - quality of sleep assessed - response to mental health treatment monitored - response to pharmacologic therapy monitored - sleep hygiene techniques encouraged Follow Up Plan: Face to Face appointment with care management team member scheduled for:   08-08-2021 at 0945 am    Task: Alleviate Barriers to Depression Treatment Completed 06/09/2021  Outcome: Positive  Note:   Care Management Activities:    - activity or exercise based on tolerance encouraged - complementary therapy use encouraged - depression screen reviewed - emotional support provided - healthy lifestyle promoted - medication side effects monitored and managed - pain managed - participation in mental health treatment encouraged - quality of sleep assessed - response to mental health treatment monitored - response to pharmacologic therapy  monitored - sleep hygiene techniques encouraged         Plan:Face to Face appointment with care management team member scheduled for: 08-08-2021 at [place] am  [name] RN, MSN, [place] Mobile: [phone]

## 2021-06-09 NOTE — Telephone Encounter (Signed)
  Chronic Care Management   Note  06/09/2021 Name: Kendra [name] MRN: [id number] DOB: 1945/10/28  The patient called the RNCM back and completed the call. See the new encounter.   Follow up plan: Face to Face appointment with care management team member scheduled for:  08-08-2021 at 10 am  [place], MSN, [place] Mobile: [phone]

## 2021-06-09 NOTE — Patient Instructions (Signed)
Visit Information  PATIENT GOALS:  Goals Addressed             This Visit's Progress    RNCM: Prevent Falls and Injury       Follow Up Date 08-08-2021   - add more outdoor lighting - always use handrails on the stairs - always wear low-heeled or flat shoes or slippers with nonskid soles - call the doctor if I am feeling too drowsy - install bathroom grab bars - keep a flashlight by the bed - keep my cell phone with me always - learn how to get back up if I fall - make an emergency alert plan in case I fall - pick up clutter from the floors - use a nonslip pad with throw rugs, or remove them completely - use a cane or walker - use a nightlight in the bathroom - wear my glasses and/or hearing aid    Why is this important?   Most falls happen when it is hard for you to walk safely. Your balance may be off because of an illness. You may have pain in your knees, hip or other joints.  You may be overly tired or taking medicines that make you sleepy. You may not be able to see or hear clearly.  Falls can lead to broken bones, bruises or other injuries.  There are things you can do to help prevent falling.     Notes: 04-14-2021: The patient states that she had a fall last week when it was raining and she went outside and her shoes were slippery. She hit her arm. Denies any acute distress. Education on safety and preventing falls. Will talk with the pcp about a cane script to be sent to [facility]. She states the walker is too big and causes her more issues and she does not like to use. Will continue to monitor. 06-09-2021: The patient denies any new falls. States she is being safe. Will continue to monitor for changes.      RNCM: Track and Manage My Blood Pressure-Hypertension       Timeframe:  Long-Range Goal Priority:  Medium Start Date:       01-24-2021                      Expected End Date:   01-24-2022                    Follow Up Date 08-08-2021   - check blood pressure 3  times per week - check blood pressure daily - check blood pressure weekly - choose a place to take my blood pressure (home, clinic or office, retail store) - write blood pressure results in a log or diary    Why is this important?   You won't feel high blood pressure, but it can still hurt your blood vessels.  High blood pressure can cause heart or kidney problems. It can also cause a stroke.  Making lifestyle changes like losing a little weight or eating less salt will help.  Checking your blood pressure at home and at different times of the day can help to control blood pressure.  If the doctor prescribes medicine remember to take it the way the doctor ordered.  Call the office if you cannot afford the medicine or if there are questions about it.     Notes: The patient gave the [facility] several readings: 790-240 systolic to 97-35 diastolic. 11-18-9922: The patient has lost  her sheet for April. Gave a reading of 118/78 98 for May, had not taken this am at the time of the call. Has requested new log sheets. Will send via mail to the patient. 04-14-2021: The patient had not gotten out of bed yet so did not have readings this am. She states that her pressures are doing well at this time and denies any elevated blood pressures. 06-09-2021: The patient states the last time she took her blood pressure was 06-05-2021 and it was 132/89 and pulse 90. Usually the patient has more readings. The patient states that she has been stressed out bout her sons and their circumstances. She states that she knows she needs to check it more frequently. ER visit for CP in August. Will follow up with cardiologist on 06-10-2021.      RNCM: Track and Manage My Symptoms-Depression       Timeframe:  Long-Range Goal Priority:  High Start Date:       06-09-2021                      Expected End Date:       06-09-2022                Follow Up Date 08/08/2021    - avoid negative self-talk - develop a personal safety plan - develop  a plan to deal with triggers like holidays, anniversaries - exercise at least 2 to 3 times per week - have a plan for how to handle bad days - journal feelings and what helps to feel better or worse - spend time or talk with others at least 2 to 3 times per week - spend time or talk with others every day - watch for early signs of feeling worse    Why is this important?   Keeping track of your progress will help your treatment team find the right mix of medicine and therapy for you.  Write in your journal every day.  Day-to-day changes in depression symptoms are normal. It may be more helpful to check your progress at the end of each week instead of every day.     Notes: 06-09-2021: The patient states she is really stressed out right now and she feels this is impacting her health and well being. The patient states that she is concerned about her son that is in prison and the other son who has a "crazy" girlfriend and he calls the patient upset about the girlfriend. The patient states that she is trying not to let it bother her. Education and support given. Will continue to monitor for changes. Told patient to talk out her concerns with the pcp and the CCM team.         The patient verbalized understanding of instructions, educational materials, and care plan provided today and declined offer to receive copy of patient instructions, educational materials, and care plan.   Face to Face appointment with care management team member scheduled for:  08-08-2021 at [place] am  [name] RN, MSN, [place] Mobile: [phone]

## 2021-06-10 ENCOUNTER — Other Ambulatory Visit: Payer: Self-pay | Admitting: Internal Medicine

## 2021-06-10 ENCOUNTER — Ambulatory Visit: Payer: Self-pay

## 2021-06-10 ENCOUNTER — Encounter: Payer: Self-pay | Admitting: Cardiology

## 2021-06-10 ENCOUNTER — Other Ambulatory Visit: Payer: Self-pay

## 2021-06-10 ENCOUNTER — Ambulatory Visit (INDEPENDENT_AMBULATORY_CARE_PROVIDER_SITE_OTHER): Payer: Medicare Other | Admitting: Cardiology

## 2021-06-10 VITALS — BP 130/76 | HR 84 | Ht 64.0 in | Wt 130.0 lb

## 2021-06-10 DIAGNOSIS — I2510 Atherosclerotic heart disease of native coronary artery without angina pectoris: Secondary | ICD-10-CM

## 2021-06-10 DIAGNOSIS — R0609 Other forms of dyspnea: Secondary | ICD-10-CM

## 2021-06-10 DIAGNOSIS — F17200 Nicotine dependence, unspecified, uncomplicated: Secondary | ICD-10-CM

## 2021-06-10 DIAGNOSIS — R072 Precordial pain: Secondary | ICD-10-CM | POA: Diagnosis not present

## 2021-06-10 DIAGNOSIS — E7800 Pure hypercholesterolemia, unspecified: Secondary | ICD-10-CM | POA: Diagnosis not present

## 2021-06-10 DIAGNOSIS — R0600 Dyspnea, unspecified: Secondary | ICD-10-CM | POA: Diagnosis not present

## 2021-06-10 DIAGNOSIS — J432 Centrilobular emphysema: Secondary | ICD-10-CM

## 2021-06-10 DIAGNOSIS — J449 Chronic obstructive pulmonary disease, unspecified: Secondary | ICD-10-CM

## 2021-06-10 LAB — MULTIPLE MYELOMA PANEL, SERUM
Albumin SerPl Elph-Mcnc: 4 g/dL (ref 2.9–4.4)
Albumin/Glob SerPl: 1.4 (ref 0.7–1.7)
Alpha 1: 0.3 g/dL (ref 0.0–0.4)
Alpha2 Glob SerPl Elph-Mcnc: 0.8 g/dL (ref 0.4–1.0)
B-Globulin SerPl Elph-Mcnc: 1 g/dL (ref 0.7–1.3)
Gamma Glob SerPl Elph-Mcnc: 0.9 g/dL (ref 0.4–1.8)
Globulin, Total: 2.9 g/dL (ref 2.2–3.9)
IgA: 226 mg/dL (ref 64–422)
IgG (Immunoglobin G), Serum: 853 mg/dL (ref 586–1602)
IgM (Immunoglobulin M), Srm: 132 mg/dL (ref 26–217)
Total Protein ELP: 6.9 g/dL (ref 6.0–8.5)

## 2021-06-10 MED ORDER — CLOPIDOGREL BISULFATE 75 MG PO TABS: 75.0000 mg | ORAL_TABLET | Freq: Every day | ORAL | Status: DC

## 2021-06-10 MED ORDER — ASPIRIN EC 81 MG PO TBEC: 81.0000 mg | DELAYED_RELEASE_TABLET | Freq: Every day | ORAL | 3 refills | Status: DC

## 2021-06-10 NOTE — Progress Notes (Signed)
Cardiology Office Note:    Date:  06/10/2021   ID:  Kendra [name], DOB 08-02-1946, MRN [id number]  PCP:  Kendra Fenton, NP   [facility] Providers Cardiologist:  None     Referring MD: Kendra Fenton, NP   Chief Complaint  Patient presents with   New Patient (Initial Visit)    ED follow up -- Chest pain. Patient c.o some SOB. Meds reviewed verbally with patient.     History of Present Illness:    Kendra [name] is a 75 y.o. female with a hx of hyperlipidemia, diabetes, current smoker, COPD, CVA 2021, carotid artery stenosis s/p bilateral carotid endarterectomies 2021, who presents due to chest pain.  Patient was seen last month in the ED due to chest pain.  She states being stressed at the time from her children.  Work-up in the ED did not show any acute findings, troponin was normal.  She was advised to follow-up with cardiology as outpatient.  She denies further episodes of chest pain.  She states having shortness of breath with exertion such as walking to her mailbox.  Currently smokes, takes aspirin, Plavix as prescribed.  Screening chest CT 3 months ago showed coronary artery calcifications.   Echo 09/2019 EF 60 to 65% Noncontrast chest CT 01/2021 coronary artery calcifications  Past Medical History:  Diagnosis Date   Asthma    Back pain    Cataract    Cervical disc disorder    COPD (chronic obstructive pulmonary disease) (HCC)    Depression    Diabetes ([place])    type II   Diabetic neuropathy (HCC)    Dyspnea    with exertion   GERD (gastroesophageal reflux disease)    History of kidney stones    per patient "can't remember the year"   Hyperlipidemia    Hypertension    per patient "take meds for it"   Insomnia    Neuropathy    Osteoarthritis    Osteopenia    Pneumonia 2018   per patient   Reflux    Rheumatoid arthritis ([place])    Stroke ([place]) 2015   and again 2017  - Weakness in left leg, staggers w/ walking, vision    Tenosynovitis     Past  Surgical History:  Procedure Laterality Date   BREAST BIOPSY Right    Fatty Tumor   ENDARTERECTOMY Right 10/21/2019   Procedure: ENDARTERECTOMY CAROTID RIGHT;  Surgeon: [name], MD;  Location: [place];  Service: Vascular;  Laterality: Right;   ENDARTERECTOMY Left 12/30/2019   Procedure: LEFT CAROTID ENDARTERECTOMY;  Surgeon: [name], MD;  Location: [place];  Service: Vascular;  Laterality: Left;   NECK SURGERY     OVARIAN CYST REMOVAL     PATCH ANGIOPLASTY Right 10/21/2019   Procedure: Patch Angioplasty Using XenoSure Biologic Patch Right Carotid;  Surgeon: [name], MD;  Location: [place];  Service: Vascular;  Laterality: Right;   PATCH ANGIOPLASTY Left 12/30/2019   Procedure: Patch Angioplasty Left Carotid;  Surgeon: [name], MD;  Location: [facility];  Service: Vascular;  Laterality: Left;    Current Medications: Current Meds  Medication Sig   Acetaminophen (TYLENOL 8 HOUR ARTHRITIS PAIN PO) Take 2 capsules by mouth in the morning, at noon, in the evening, and at bedtime.   albuterol (VENTOLIN HFA) 108 (90 Base) MCG/ACT inhaler INHALE 1-2 PUFFS BY MOUTH EVERY 6 HOURS AS NEEDED WHEEZING/ SHORTNESS OF BREATH   aspirin EC 81 MG  tablet Take 1 tablet (81 mg total) by mouth daily. Swallow whole.   atorvastatin (LIPITOR) 40 MG tablet TAKE 1 TABLET BY MOUTH BEDTIME   Blood Glucose Monitoring Suppl (ONETOUCH VERIO) w/Device KIT USE AS DIRECTED   buPROPion (WELLBUTRIN XL) 150 MG 24 hr tablet TAKE 1 TABLET BY MOUTH ONCE DAILY   cyclobenzaprine (FLEXERIL) 5 MG tablet Take 1 tablet (5 mg total) by mouth 3 (three) times daily as needed for muscle spasms.   docusate sodium (COLACE) 100 MG capsule Take 1 capsule (100 mg total) by mouth 2 (two) times daily as needed for mild constipation.   glucose blood (ONETOUCH VERIO) test strip Use as instructed   Lancet Devices (ONE TOUCH DELICA LANCING DEV) MISC 1 Device by Does not apply route 2 (two) times  daily.   OneTouch Delica Lancets 71H MISC 1 Device by Does not apply route 2 (two) times daily.   traZODone (DESYREL) 50 MG tablet Take 0.5-1 tablets (25-50 mg total) by mouth at bedtime as needed for sleep.   TRELEGY ELLIPTA 100-62.5-25 MCG/INH AEPB USE ONE INHALATION INTO THE LUNGS ONCE A DAY RINSE MOUTH AFTER EACH USE   [DISCONTINUED] aspirin 325 MG tablet Take 325 mg by mouth daily.     Allergies:   Citalopram   Social History   Socioeconomic History   Marital status: Widowed    Spouse name: Not on file   Number of children: Not on file   Years of education: Not on file   Highest education level: Not on file  Occupational History   Not on file  Tobacco Use   Smoking status: Some Days    Packs/day: 0.25    Years: 61.00    Pack years: 15.25    Types: Cigarettes   Smokeless tobacco: Former    Types: Snuff  Vaping Use   Vaping Use: Never used  Substance and Sexual Activity   Alcohol use: No   Drug use: No   Sexual activity: Never  Other Topics Concern   Not on file  Social History Narrative   Not on file   Social Determinants of Health   Financial Resource Strain: Low Risk    Difficulty of Paying Living Expenses: Not very hard  Food Insecurity: No Food Insecurity   Worried About Charity fundraiser in the Last Year: Never true   [place] in the Last Year: Never true  Transportation Needs: No Transportation Needs   Lack of Transportation (Medical): No   Lack of Transportation (Non-Medical): No  Physical Activity: Inactive   Days of Exercise per Week: 0 days   Minutes of Exercise per Session: 0 min  Stress: No Stress Concern Present   Feeling of Stress : Only a little  Social Connections: Socially Isolated   Frequency of Communication with Friends and Family: More than three times a week   Frequency of Social Gatherings with Friends and Family: More than three times a week   Attends Religious Services: Never   Marine scientist or Organizations: No    Attends Archivist Meetings: Never   Marital Status: Widowed     Family History: The patient's family history includes Breast cancer in her mother; Depression in her father; Diabetes in her mother; Heart murmur in her son, son, and son; Hyperlipidemia in her mother; Stroke in her father.  ROS:   Please see the history of present illness.     All other systems reviewed and are negative.  EKGs/Labs/Other  Studies Reviewed:    The following studies were reviewed today:   EKG:  EKG is  ordered today.  The ekg ordered today demonstrates normal sinus rhythm, septal infarct.  Recent Labs: 12/21/2020: TSH 0.68 06/06/2021: ALT 19; BUN 24; Creatinine, Ser 0.80; Hemoglobin 11.6; Platelets 228; Potassium 4.1; Sodium 139  Recent Lipid Panel    Component Value Date/Time   CHOL 92 12/21/2020 1134   CHOL 177 06/07/2015 1215   CHOL 141 09/02/2014 0440   TRIG 70 12/21/2020 1134   TRIG 155 09/02/2014 0440   HDL 51 12/21/2020 1134   HDL 56 06/07/2015 1215   HDL 37 (L) 09/02/2014 0440   CHOLHDL 1.8 12/21/2020 1134   VLDL 16 10/17/2019 0606   VLDL 31 09/02/2014 0440   LDLCALC 26 12/21/2020 1134   LDLCALC 73 09/02/2014 0440     Risk Assessment/Calculations:         Physical Exam:    VS:  BP 130/76 (BP Location: Left Arm, Patient Position: Sitting, Cuff Size: Normal)   Pulse 84   Ht $R'5\' 4"'gV$  (1.626 m)   Wt 130 lb (59 kg)   SpO2 98%   BMI 22.31 kg/m     Wt Readings from Last 3 Encounters:  06/10/21 130 lb (59 kg)  05/31/21 129 lb 9.6 oz (58.8 kg)  04/20/21 123 lb (55.8 kg)     GEN:  Well nourished, well developed in no acute distress HEENT: Normal NECK: No JVD; No carotid bruits LYMPHATICS: No lymphadenopathy CARDIAC: RRR, no murmurs, rubs, gallops RESPIRATORY: Rhonchorous breath sounds at bases ABDOMEN: Soft, non-tender, non-distended MUSCULOSKELETAL:  No edema; [place]'s nodes noted on fingers SKIN: Warm and dry NEUROLOGIC:  Alert and oriented x 3 PSYCHIATRIC:   Normal affect   ASSESSMENT:    1. Coronary artery disease involving native coronary artery of native heart without angina pectoris   2. Dyspnea on exertion   3. Pure hypercholesterolemia   4. Smoking   5. Precordial pain    PLAN:    In order of problems listed above:  Coronary artery calcification noted chest CT, risk factors smoking, diabetes, hyperlipidemia.  Last echocardiogram 09/2019 EF normal 55 to 60%.  Obtain Lexiscan Myoview to evaluate any significant ischemia.  Reduce aspirin to 81 mg daily, continue Plavix 75 mg daily.  LDL at goal.  Continue Lipitor. Dyspnea on exertion, this could be an anginal equivalent or secondary to COPD.  Lexiscan Myoview as above.  Last echo with preserved EF. Hyperlipidemia, LDL at goal, continue Lipitor 40 mg daily. Current smoker, cessation advised.  Follow-up after Myoview.  Recommend follow-up with PCP regarding arthritis management.  Referral to rheum may be considered.   Shared Decision Making/Informed Consent The risks [chest pain, shortness of breath, cardiac arrhythmias, dizziness, blood pressure fluctuations, myocardial infarction, stroke/transient ischemic attack, nausea, vomiting, allergic reaction, radiation exposure, metallic taste sensation and life-threatening complications (estimated to be 1 in 10,000)], benefits (risk stratification, diagnosing coronary artery disease, treatment guidance) and alternatives of a nuclear stress test were discussed in detail with Ms. Shatswell and she agrees to proceed.   Medication Adjustments/Labs and Tests Ordered: Current medicines are reviewed at length with the patient today.  Concerns regarding medicines are outlined above.  Orders Placed This Encounter  Procedures   NM Myocar Multi W/Spect W/Wall Motion / EF   EKG 12-Lead   Meds ordered this encounter  Medications   aspirin EC 81 MG tablet    Sig: Take 1 tablet (81 mg total) by mouth daily. Swallow  whole.    Dispense:  90 tablet    Refill:   3   clopidogrel (PLAVIX) 75 MG tablet    Sig: Take 1 tablet (75 mg total) by mouth daily.    Patient Instructions  Medication Instructions:   Your physician has recommended you make the following change in your medication:    DECREASE your Aspirin to 81 MG once a day.   *If you need a refill on your cardiac medications before your next appointment, please call your pharmacy*   Lab Work: None ordered If you have labs (blood work) drawn today and your tests are completely normal, you will receive your results only by: [place] (if you have MyChart) OR A paper copy in the mail If you have any lab test that is abnormal or we need to change your treatment, we will call you to review the results.   Testing/Procedures:  [place]       Your caregiver has ordered a Stress Test with nuclear imaging. The purpose of this test is to evaluate the blood supply to your heart muscle. This procedure is referred to as a "Non-Invasive Stress Test." This is because other than having an IV started in your vein, nothing is inserted or "invades" your body. Cardiac stress tests are done to find areas of poor blood flow to the heart by determining the extent of coronary artery disease (CAD). Some patients exercise on a treadmill, which naturally increases the blood flow to your heart, while others who are  unable to walk on a treadmill due to physical limitations have a pharmacologic/chemical stress agent called Lexiscan . This medicine will mimic walking on a treadmill by temporarily increasing your coronary blood flow.      PLEASE REPORT TO [facility] MEDICAL MALL ENTRANCE   THE VOLUNTEERS AT THE FIRST DESK WILL DIRECT YOU WHERE TO GO     *Please note: these test may take anywhere between 2-4 hours to complete       Date of Procedure:_____________________________________   Arrival Time for Procedure:______________________________    PLEASE NOTIFY THE OFFICE AT LEAST 24 HOURS IN ADVANCE IF  YOU ARE UNABLE TO KEEP YOUR APPOINTMENT.  [place] 24 HOURS IN ADVANCE IF YOU ARE UNABLE TO KEEP YOUR APPOINTMENT. [phone]         How to prepare for your Myoview test:     1. Do not eat or drink after midnight  2. No caffeine for 24 hours prior to test  3. No smoking 24 hours prior to test.  4. Unless instructed otherwise, Take your medication with a small sips of water.    5.         Ladies, please do not wear dresses. Skirts or pants are appropriate. Please wear a short sleeve shirt.  6. No perfume, cologne or lotion.  7. Wear comfortable walking shoes. No heels!     Follow-Up: At [facility], you and your health needs are our priority.  As part of our continuing mission to provide you with exceptional heart care, we have created designated Provider Care Teams.  These Care Teams include your primary Cardiologist (physician) and Advanced Practice Providers (APPs -  Physician Assistants and Nurse Practitioners) who all work together to provide you with the care you need, when you need it.  We recommend signing up for the patient portal called "MyChart".  Sign up information is provided on this After Visit Summary.  MyChart  is used to connect with patients for Virtual Visits (Telemedicine).  Patients are able to view lab/test results, encounter notes, upcoming appointments, etc.  Non-urgent messages can be sent to your provider as well.   To learn more about what you can do with MyChart, go to [email or web address].    Your next appointment:   Follow up after testing   The format for your next appointment:   In Person  Provider:   Kate Sable, MD   Other Instructions    Signed, Kate Sable, MD  06/10/2021 12:47 PM    [place]

## 2021-06-10 NOTE — Patient Instructions (Signed)
Medication Instructions:   Your physician has recommended you make the following change in your medication:    DECREASE your Aspirin to 81 MG once a day.   *If you need a refill on your cardiac medications before your next appointment, please call your pharmacy*   Lab Work: None ordered If you have labs (blood work) drawn today and your tests are completely normal, you will receive your results only by: [place] (if you have MyChart) OR A paper copy in the mail If you have any lab test that is abnormal or we need to change your treatment, we will call you to review the results.   Testing/Procedures:  [place]       Your caregiver has ordered a Stress Test with nuclear imaging. The purpose of this test is to evaluate the blood supply to your heart muscle. This procedure is referred to as a "Non-Invasive Stress Test." This is because other than having an IV started in your vein, nothing is inserted or "invades" your body. Cardiac stress tests are done to find areas of poor blood flow to the heart by determining the extent of coronary artery disease (CAD). Some patients exercise on a treadmill, which naturally increases the blood flow to your heart, while others who are  unable to walk on a treadmill due to physical limitations have a pharmacologic/chemical stress agent called Lexiscan . This medicine will mimic walking on a treadmill by temporarily increasing your coronary blood flow.      PLEASE REPORT TO [facility] MEDICAL MALL ENTRANCE   THE VOLUNTEERS AT THE FIRST DESK WILL DIRECT YOU WHERE TO GO     *Please note: these test may take anywhere between 2-4 hours to complete       Date of Procedure:_____________________________________   Arrival Time for Procedure:______________________________    PLEASE NOTIFY THE OFFICE AT LEAST 24 HOURS IN ADVANCE IF YOU ARE UNABLE TO KEEP YOUR APPOINTMENT.  [place] 24 HOURS IN  ADVANCE IF YOU ARE UNABLE TO KEEP YOUR APPOINTMENT. [phone]         How to prepare for your Myoview test:     1. Do not eat or drink after midnight  2. No caffeine for 24 hours prior to test  3. No smoking 24 hours prior to test.  4. Unless instructed otherwise, Take your medication with a small sips of water.    5.         Ladies, please do not wear dresses. Skirts or pants are appropriate. Please wear a short sleeve shirt.  6. No perfume, cologne or lotion.  7. Wear comfortable walking shoes. No heels!     Follow-Up: At [facility], you and your health needs are our priority.  As part of our continuing mission to provide you with exceptional heart care, we have created designated Provider Care Teams.  These Care Teams include your primary Cardiologist (physician) and Advanced Practice Providers (APPs -  Physician Assistants and Nurse Practitioners) who all work together to provide you with the care you need, when you need it.  We recommend signing up for the patient portal called "MyChart".  Sign up information is provided on this After Visit Summary.  MyChart is used to connect with patients for Virtual Visits (Telemedicine).  Patients are able to view lab/test results, encounter notes, upcoming appointments, etc.  Non-urgent messages can be sent to your provider as well.   To learn more about what  you can do with MyChart, go to [email or web address].    Your next appointment:   Follow up after testing   The format for your next appointment:   In Person  Provider:   Kate Sable, MD   Other Instructions

## 2021-06-10 NOTE — Telephone Encounter (Signed)
Patient states she is returning a nurse call regarding right arm pain since the received a shot on 06/01/2021. Patient states she is unable to lift her whole arm

## 2021-06-10 NOTE — Telephone Encounter (Signed)
Patient is calling to report she is having soreness/pain in upper R arm. Patient states she has pain with lifting the arm and is it hard to use it. Patient had flu shot 9/19. Patient is at appointment today-pre-op for radiology and the nurse with her examine arm- reports no redness, no swelling, soft- no hard knot- can not even tell where injection site was. Patient advised UC for evaluation of arm pain- no appointment in office within disposition.  Reason for Disposition  [1] MODERATE pain (e.g., interferes with normal activities) AND [2] present > 3 days  [1] Pain, tenderness, or swelling at the injection site AND [2] persists > 3 days  Answer Assessment - Initial Assessment Questions 1. SYMPTOMS: "What is the main symptom?" (e.g., redness, swelling, pain)      R arm- painful to lift arm 2. ONSET: "When was the vaccine (shot) given?" "How much later did the soreness begin?" (e.g., hours, days ago)      Influenza-9/14, 3 days after 3. SEVERITY: "How bad is it?"      Unable to lift without pain 4. FEVER: "Is there a fever?" If Yes, ask: "What is it, how was it measured, and when did it start?"      no 5. IMMUNIZATIONS GIVEN: "What shots have you recently received?"     influenza 6. PAST REACTIONS: "Have you reacted to immunizations before?" If Yes, ask: "What happened?"     no 7. OTHER SYMPTOMS: "Do you have any other symptoms?"     no  Answer Assessment - Initial Assessment Questions 1. ONSET: "When did the pain start?"     R upper arm 2. LOCATION: "Where is the pain located?"     R upper arm 3. PAIN: "How bad is the pain?" (Scale 1-10; or mild, moderate, severe)   - MILD (1-3): doesn't interfere with normal activities   - MODERATE (4-7): interferes with normal activities (e.g., work or school) or awakens from sleep   - SEVERE (8-10): excruciating pain, unable to do any normal activities, unable to hold a cup of water     moderate 4. WORK OR EXERCISE: "Has there been any recent work  or exercise that involved this part of the body?"     No- only flu shot 9/14 5. CAUSE: "What do you think is causing the arm pain?"     unsure 6. OTHER SYMPTOMS: "Do you have any other symptoms?" (e.g., neck pain, swelling, rash, fever, numbness, weakness)     no 7. PREGNANCY: "Is there any chance you are pregnant?" "When was your last menstrual period?"     N/a  Protocols used: Immunization Reactions-A-AH, Arm Pain-A-AH

## 2021-06-10 NOTE — Telephone Encounter (Signed)
I called the patient and advised her to use a ice pack and take tylenol as needed for pain. If she feel like the pain is severe she need to go to the Urgent Care. The pt was seen at Pre-op today and  the nurse with her examine arm- reports no redness, no swelling, soft- no hard knot- can not even tell where injection site was. Patient advised UC for evaluation of arm pain- no appointment in office within disposition.

## 2021-06-13 ENCOUNTER — Inpatient Hospital Stay: Payer: Medicare Other

## 2021-06-13 ENCOUNTER — Telehealth: Payer: Self-pay | Admitting: *Deleted

## 2021-06-13 ENCOUNTER — Inpatient Hospital Stay: Payer: Medicare Other | Admitting: Oncology

## 2021-06-13 NOTE — Telephone Encounter (Signed)
Patient called reporting that the Lucianne Lei did not pick he rup for appointment today and was asking what to do with the urine specimen she collected. I spoke with lab who said so long as it is refrigerated and she can get it to [place] this week, it is good. Patient informed of this and said that she has no one that can bring it by. She will get a new container and restart collection closer to her next appointment 10/11

## 2021-06-16 ENCOUNTER — Ambulatory Visit: Payer: Medicare Other | Admitting: Internal Medicine

## 2021-06-17 DIAGNOSIS — I152 Hypertension secondary to endocrine disorders: Secondary | ICD-10-CM

## 2021-06-17 DIAGNOSIS — E1159 Type 2 diabetes mellitus with other circulatory complications: Secondary | ICD-10-CM

## 2021-06-17 DIAGNOSIS — E1169 Type 2 diabetes mellitus with other specified complication: Secondary | ICD-10-CM | POA: Diagnosis not present

## 2021-06-17 DIAGNOSIS — E785 Hyperlipidemia, unspecified: Secondary | ICD-10-CM | POA: Diagnosis not present

## 2021-06-17 DIAGNOSIS — F3341 Major depressive disorder, recurrent, in partial remission: Secondary | ICD-10-CM

## 2021-06-20 ENCOUNTER — Other Ambulatory Visit: Payer: Self-pay

## 2021-06-20 ENCOUNTER — Ambulatory Visit (INDEPENDENT_AMBULATORY_CARE_PROVIDER_SITE_OTHER): Payer: Medicare Other | Admitting: Internal Medicine

## 2021-06-20 ENCOUNTER — Ambulatory Visit
Admission: RE | Admit: 2021-06-20 | Discharge: 2021-06-20 | Disposition: A | Discharge status: Home / Self Care | Payer: Medicare Other | Source: Ambulatory Visit | Attending: Internal Medicine | Admitting: Internal Medicine

## 2021-06-20 ENCOUNTER — Encounter: Payer: Self-pay | Admitting: Internal Medicine

## 2021-06-20 ENCOUNTER — Ambulatory Visit
Admission: RE | Admit: 2021-06-20 | Discharge: 2021-06-20 | Disposition: A | Discharge status: Home / Self Care | Payer: Medicare Other | Attending: Internal Medicine | Admitting: Internal Medicine

## 2021-06-20 VITALS — BP 137/83 | HR 99 | Temp 97.3°F | Wt 131.0 lb

## 2021-06-20 DIAGNOSIS — G8929 Other chronic pain: Secondary | ICD-10-CM

## 2021-06-20 DIAGNOSIS — M25511 Pain in right shoulder: Secondary | ICD-10-CM

## 2021-06-20 MED ORDER — PREDNISONE 10 MG PO TABS: ORAL_TABLET | ORAL | 0 refills | Status: DC

## 2021-06-20 MED ORDER — DOCUSATE SODIUM 100 MG PO CAPS: 100.0000 mg | ORAL_CAPSULE | Freq: Two times a day (BID) | ORAL | 2 refills | Status: DC | PRN

## 2021-06-20 NOTE — Telephone Encounter (Signed)
Copied from [place] [id number]. Topic: General - Other >> Jun 20, 2021  4:19 PM Pawlus, Brayton Layman A wrote: Reason for CRM: Pt had an appt today and forgot to ask for stool softners to be sent into [place], [place]. Please advise.

## 2021-06-20 NOTE — Patient Instructions (Signed)
Shoulder Exercises Ask your health care provider which exercises are safe for you. Do exercises exactly as told by your health care provider and adjust them as directed. It is normal to feel mild stretching, pulling, tightness, or discomfort as you do these exercises. Stop right away if you feel sudden pain or your pain gets worse. Do not begin these exercises until told by your health care provider. Stretching exercises External rotation and abduction This exercise is sometimes called corner stretch. This exercise rotates your arm outward (external rotation) and moves your arm out from your body (abduction). Stand in a doorway with one of your feet slightly in front of the other. This is called a staggered stance. If you cannot reach your forearms to the door frame, stand facing a corner of a room. Choose one of the following positions as told by your health care provider: Place your hands and forearms on the door frame above your head. Place your hands and forearms on the door frame at the height of your head. Place your hands on the door frame at the height of your elbows. Slowly move your weight onto your front foot until you feel a stretch across your chest and in the front of your shoulders. Keep your head and chest upright and keep your abdominal muscles tight. Hold for __________ seconds. To release the stretch, shift your weight to your back foot. Repeat __________ times. Complete this exercise __________ times a day. Extension, standing Stand and hold a broomstick, a cane, or a similar object behind your back. Your hands should be a little wider than shoulder width apart. Your palms should face away from your back. Keeping your elbows straight and your shoulder muscles relaxed, move the stick away from your body until you feel a stretch in your shoulders (extension). Avoid shrugging your shoulders while you move the stick. Keep your shoulder blades tucked down toward the middle of your  back. Hold for __________ seconds. Slowly return to the starting position. Repeat __________ times. Complete this exercise __________ times a day. Range-of-motion exercises Pendulum  Stand near a wall or a surface that you can hold onto for balance. Bend at the waist and let your left / right arm hang straight down. Use your other arm to support you. Keep your back straight and do not lock your knees. Relax your left / right arm and shoulder muscles, and move your hips and your trunk so your left / right arm swings freely. Your arm should swing because of the motion of your body, not because you are using your arm or shoulder muscles. Keep moving your hips and trunk so your arm swings in the following directions, as told by your health care provider: Side to side. Forward and backward. In clockwise and counterclockwise circles. Continue each motion for __________ seconds, or for as long as told by your health care provider. Slowly return to the starting position. Repeat __________ times. Complete this exercise __________ times a day. Shoulder flexion, standing  Stand and hold a broomstick, a cane, or a similar object. Place your hands a little more than shoulder width apart on the object. Your left / right hand should be palm up, and your other hand should be palm down. Keep your elbow straight and your shoulder muscles relaxed. Push the stick up with your healthy arm to raise your left / right arm in front of your body, and then over your head until you feel a stretch in your shoulder (flexion). Avoid   shrugging your shoulder while you raise your arm. Keep your shoulder blade tucked down toward the middle of your back. Hold for __________ seconds. Slowly return to the starting position. Repeat __________ times. Complete this exercise __________ times a day. Shoulder abduction, standing Stand and hold a broomstick, a cane, or a similar object. Place your hands a little more than shoulder  width apart on the object. Your left / right hand should be palm up, and your other hand should be palm down. Keep your elbow straight and your shoulder muscles relaxed. Push the object across your body toward your left / right side. Raise your left / right arm to the side of your body (abduction) until you feel a stretch in your shoulder. Do not raise your arm above shoulder height unless your health care provider tells you to do that. If directed, raise your arm over your head. Avoid shrugging your shoulder while you raise your arm. Keep your shoulder blade tucked down toward the middle of your back. Hold for __________ seconds. Slowly return to the starting position. Repeat __________ times. Complete this exercise __________ times a day. Internal rotation  Place your left / right hand behind your back, palm up. Use your other hand to dangle an exercise band, a towel, or a similar object over your shoulder. Grasp the band with your left / right hand so you are holding on to both ends. Gently pull up on the band until you feel a stretch in the front of your left / right shoulder. The movement of your arm toward the center of your body is called internal rotation. Avoid shrugging your shoulder while you raise your arm. Keep your shoulder blade tucked down toward the middle of your back. Hold for __________ seconds. Release the stretch by letting go of the band and lowering your hands. Repeat __________ times. Complete this exercise __________ times a day. Strengthening exercises External rotation  Sit in a stable chair without armrests. Secure an exercise band to a stable object at elbow height on your left / right side. Place a soft object, such as a folded towel or a small pillow, between your left / right upper arm and your body to move your elbow about 4 inches (10 cm) away from your side. Hold the end of the exercise band so it is tight and there is no slack. Keeping your elbow pressed  against the soft object, slowly move your forearm out, away from your abdomen (external rotation). Keep your body steady so only your forearm moves. Hold for __________ seconds. Slowly return to the starting position. Repeat __________ times. Complete this exercise __________ times a day. Shoulder abduction  Sit in a stable chair without armrests, or stand up. Hold a __________ weight in your left / right hand, or hold an exercise band with both hands. Start with your arms straight down and your left / right palm facing in, toward your body. Slowly lift your left / right hand out to your side (abduction). Do not lift your hand above shoulder height unless your health care provider tells you that this is safe. Keep your arms straight. Avoid shrugging your shoulder while you do this movement. Keep your shoulder blade tucked down toward the middle of your back. Hold for __________ seconds. Slowly lower your arm, and return to the starting position. Repeat __________ times. Complete this exercise __________ times a day. Shoulder extension Sit in a stable chair without armrests, or stand up. Secure an exercise band   to a stable object in front of you so it is at shoulder height. Hold one end of the exercise band in each hand. Your palms should face each other. Straighten your elbows and lift your hands up to shoulder height. Step back, away from the secured end of the exercise band, until the band is tight and there is no slack. Squeeze your shoulder blades together as you pull your hands down to the sides of your thighs (extension). Stop when your hands are straight down by your sides. Do not let your hands go behind your body. Hold for __________ seconds. Slowly return to the starting position. Repeat __________ times. Complete this exercise __________ times a day. Shoulder row Sit in a stable chair without armrests, or stand up. Secure an exercise band to a stable object in front of you so it  is at waist height. Hold one end of the exercise band in each hand. Position your palms so that your thumbs are facing the ceiling (neutral position). Bend each of your elbows to a 90-degree angle (right angle) and keep your upper arms at your sides. Step back until the band is tight and there is no slack. Slowly pull your elbows back behind you. Hold for __________ seconds. Slowly return to the starting position. Repeat __________ times. Complete this exercise __________ times a day. Shoulder press-ups  Sit in a stable chair that has armrests. Sit upright, with your feet flat on the floor. Put your hands on the armrests so your elbows are bent and your fingers are pointing forward. Your hands should be about even with the sides of your body. Push down on the armrests and use your arms to lift yourself off the chair. Straighten your elbows and lift yourself up as much as you comfortably can. Move your shoulder blades down, and avoid letting your shoulders move up toward your ears. Keep your feet on the ground. As you get stronger, your feet should support less of your body weight as you lift yourself up. Hold for __________ seconds. Slowly lower yourself back into the chair. Repeat __________ times. Complete this exercise __________ times a day. Wall push-ups  Stand so you are facing a stable wall. Your feet should be about one arm-length away from the wall. Lean forward and place your palms on the wall at shoulder height. Keep your feet flat on the floor as you bend your elbows and lean forward toward the wall. Hold for __________ seconds. Straighten your elbows to push yourself back to the starting position. Repeat __________ times. Complete this exercise __________ times a day. This information is not intended to replace advice given to you by your health care provider. Make sure you discuss any questions you have with your healthcare provider. Document Revised: 12/27/2018 Document  Reviewed: 10/04/2018 Elsevier Patient Education  2022 [employer] Inc.  

## 2021-06-20 NOTE — Progress Notes (Signed)
Subjective:    Patient ID: Kendra [name], female    DOB: 05/28/1946, 75 y.o.   MRN: [id number]  HPI  Patient presents the clinic today with complaint of arm pain.  She reports this is a chronic issue but started getting worse after her flu shot on 05/31/21. She describes the pain as constant and sharp. The pain does not radiate. She is having difficulty lifting her arm above her head. She reports weakness, but denies numbness or tingling. She denies any recent injury to the area. She takes Extra Strength Tylenol with minimal relief of symptoms.  Review of Systems     Past Medical History:  Diagnosis Date   Asthma    Back pain    Cataract    Cervical disc disorder    COPD (chronic obstructive pulmonary disease) (HCC)    Depression    Diabetes (HCC)    type II   Diabetic neuropathy (HCC)    Dyspnea    with exertion   GERD (gastroesophageal reflux disease)    History of kidney stones    per patient "can't remember the year"   Hyperlipidemia    Hypertension    per patient "take meds for it"   Insomnia    Neuropathy    Osteoarthritis    Osteopenia    Pneumonia 2018   per patient   Reflux    Rheumatoid arthritis ([place])    Stroke ([place]) 2015   and again 2017  - Weakness in left leg, staggers w/ walking, vision    Tenosynovitis     Current Outpatient Medications  Medication Sig Dispense Refill   Acetaminophen (TYLENOL 8 HOUR ARTHRITIS PAIN PO) Take 2 capsules by mouth in the morning, at noon, in the evening, and at bedtime.     albuterol (VENTOLIN HFA) 108 (90 Base) MCG/ACT inhaler INHALE 1-2 PUFFS BY MOUTH EVERY 6 HOURS AS NEEDED WHEEZING/ SHORTNESS OF BREATH 8.5 g 1   aspirin EC 81 MG tablet Take 1 tablet (81 mg total) by mouth daily. Swallow whole. 90 tablet 3   atorvastatin (LIPITOR) 40 MG tablet TAKE 1 TABLET BY MOUTH BEDTIME 90 tablet 0   Blood Glucose Monitoring Suppl (ONETOUCH VERIO) w/Device KIT USE AS DIRECTED 1 kit 0   buPROPion (WELLBUTRIN XL) 150 MG 24 hr  tablet TAKE 1 TABLET BY MOUTH ONCE DAILY 90 tablet 0   clopidogrel (PLAVIX) 75 MG tablet Take 1 tablet (75 mg total) by mouth daily.     cyclobenzaprine (FLEXERIL) 5 MG tablet Take 1 tablet (5 mg total) by mouth 3 (three) times daily as needed for muscle spasms. 30 tablet 2   docusate sodium (COLACE) 100 MG capsule Take 1 capsule (100 mg total) by mouth 2 (two) times daily as needed for mild constipation. 60 capsule 2   glucose blood (ONETOUCH VERIO) test strip Use as instructed 200 each 4   Lancet Devices (ONE TOUCH DELICA LANCING DEV) MISC 1 Device by Does not apply route 2 (two) times daily. 200 each 4   OneTouch Delica Lancets 21F MISC 1 Device by Does not apply route 2 (two) times daily. 200 each 4   traZODone (DESYREL) 50 MG tablet Take 0.5-1 tablets (25-50 mg total) by mouth at bedtime as needed for sleep. 90 tablet 0   TRELEGY ELLIPTA 100-62.5-25 MCG/INH AEPB USE ONE INHALATION INTO THE LUNGS ONCE A DAY RINSE MOUTH AFTER EACH USE 60 each 1   No current facility-administered medications for this visit.    Allergies  Allergen Reactions   Citalopram Nausea And Vomiting    Family History  Problem Relation Age of Onset   Stroke Father    Depression Father    Diabetes Mother    Hyperlipidemia Mother    Breast cancer Mother    Heart murmur Son    Heart murmur Son    Heart murmur Son     Social History   Socioeconomic History   Marital status: Widowed    Spouse name: Not on file   Number of children: Not on file   Years of education: Not on file   Highest education level: Not on file  Occupational History   Not on file  Tobacco Use   Smoking status: Some Days    Packs/day: 0.25    Years: 61.00    Pack years: 15.25    Types: Cigarettes   Smokeless tobacco: Former    Types: Snuff  Vaping Use   Vaping Use: Never used  Substance and Sexual Activity   Alcohol use: No   Drug use: No   Sexual activity: Never  Other Topics Concern   Not on file  Social History  Narrative   Not on file   Social Determinants of Health   Financial Resource Strain: Low Risk    Difficulty of Paying Living Expenses: Not very hard  Food Insecurity: No Food Insecurity   Worried About Charity fundraiser in the Last Year: Never true   [place] in the Last Year: Never true  Transportation Needs: No Transportation Needs   Lack of Transportation (Medical): No   Lack of Transportation (Non-Medical): No  Physical Activity: Inactive   Days of Exercise per Week: 0 days   Minutes of Exercise per Session: 0 min  Stress: No Stress Concern Present   Feeling of Stress : Only a little  Social Connections: Socially Isolated   Frequency of Communication with Friends and Family: More than three times a week   Frequency of Social Gatherings with Friends and Family: More than three times a week   Attends Religious Services: Never   Marine scientist or Organizations: No   Attends Archivist Meetings: Never   Marital Status: Widowed  Human resources officer Violence: Not At Risk   Fear of Current or Ex-Partner: No   Emotionally Abused: No   Physically Abused: No   Sexually Abused: No     Constitutional: Denies fever, malaise, fatigue, headache or abrupt weight changes.  Respiratory: Denies difficulty breathing, shortness of breath, cough or sputum production.   Cardiovascular: Denies chest pain, chest tightness, palpitations or swelling in the hands or feet.  Musculoskeletal: Patient reports shoulder pain, decreased range of motion.  Denies difficulty with gait, or joint swelling.  Skin: Denies redness, rashes, lesions or ulcercations.  Neurological: Patient reports weakness of her RUE. Denies numbness or tingling in her RUE.    No other specific complaints in a complete review of systems (except as listed in HPI above).  Objective:   Physical Exam BP 137/83 (BP Location: Left Arm, Patient Position: Sitting, Cuff Size: Normal)   Pulse 99   Temp (!) 97.3 F  (36.3 C) (Temporal)   Wt 131 lb (59.4 kg)   SpO2 100%   BMI 22.49 kg/m   Wt Readings from Last 3 Encounters:  06/10/21 130 lb (59 kg)  05/31/21 129 lb 9.6 oz (58.8 kg)  04/20/21 123 lb (55.8 kg)    General: Appears her stated age, chronically ill  appearing,  in NAD. Skin: Warm, dry and intact. No redness, warmth, rash or bruising noted at the injection site. HEENT: Head: normal shape and size;  Cardiovascular: Normal rate and rhythm. S1,S2 noted.  No murmur, rubs or gallops noted. Radial pulses 2+ bilaterally. Pulmonary/Chest: Normal effort and positive vesicular breath sounds. No respiratory distress. No wheezes, rales or ronchi noted.  Musculoskeletal: Decreased internal and external rotation of the right shoulder. Negative drop can test. Pain with palpation over the right subacromial bursa. Strength 5/5 BUE. Hand grips equal. Neurological: Alert and oriented.  BMET    Component Value Date/Time   NA 139 06/06/2021 1314   NA 141 01/18/2016 1150   NA 138 09/01/2014 2130   K 4.1 06/06/2021 1314   K 3.5 09/01/2014 2130   CL 103 06/06/2021 1314   CL 104 09/01/2014 2130   CO2 30 06/06/2021 1314   CO2 28 09/01/2014 2130   GLUCOSE 126 (H) 06/06/2021 1314   GLUCOSE 287 (H) 09/01/2014 2130   BUN 24 (H) 06/06/2021 1314   BUN 12 01/18/2016 1150   BUN 14 09/01/2014 2130   CREATININE 0.80 06/06/2021 1314   CREATININE 0.72 05/31/2021 1406   CALCIUM 9.6 06/06/2021 1314   CALCIUM 8.9 09/01/2014 2130   GFRNONAA >60 06/06/2021 1314   GFRNONAA 70 06/14/2020 0929   GFRAA 82 06/14/2020 0929    Lipid Panel     Component Value Date/Time   CHOL 92 12/21/2020 1134   CHOL 177 06/07/2015 1215   CHOL 141 09/02/2014 0440   TRIG 70 12/21/2020 1134   TRIG 155 09/02/2014 0440   HDL 51 12/21/2020 1134   HDL 56 06/07/2015 1215   HDL 37 (L) 09/02/2014 0440   CHOLHDL 1.8 12/21/2020 1134   VLDL 16 10/17/2019 0606   VLDL 31 09/02/2014 0440   LDLCALC 26 12/21/2020 1134   LDLCALC 73 09/02/2014  0440    CBC    Component Value Date/Time   WBC 4.9 06/06/2021 1314   RBC 3.66 (L) 06/06/2021 1314   HGB 11.6 (L) 06/06/2021 1314   HGB 12.7 09/01/2014 2130   HCT 35.7 (L) 06/06/2021 1314   HCT 37.9 09/01/2014 2130   PLT 228 06/06/2021 1314   PLT 260 09/05/2014 0517   MCV 97.5 06/06/2021 1314   MCV 92 09/01/2014 2130   MCH 31.7 06/06/2021 1314   MCHC 32.5 06/06/2021 1314   RDW 12.8 06/06/2021 1314   RDW 13.9 09/01/2014 2130   LYMPHSABS 1.5 06/06/2021 1314   MONOABS 0.3 06/06/2021 1314   EOSABS 0.1 06/06/2021 1314   BASOSABS 0.0 06/06/2021 1314    Hgb A1C Lab Results  Component Value Date   HGBA1C 6.7 (H) 05/31/2021           Assessment & Plan:   Chronic Right Shoulder Pain:  Xray right shoulder today RX for Pred Taper x 9 days- monitor blood sugars, will likely be elevated Shoulder exercises given Apply ice for 10 minutes 3 x day  Will follow up after xray, return precautions discussed Webb Silversmith, NP This visit occurred during the SARS-CoV-2 public health emergency.  Safety protocols were in place, including screening questions prior to the visit, additional usage of staff PPE, and extensive cleaning of exam room while observing appropriate contact time as indicated for disinfecting solutions.

## 2021-06-21 ENCOUNTER — Encounter
Admission: RE | Admit: 2021-06-21 | Discharge: 2021-06-21 | Disposition: A | Discharge status: Home / Self Care | Payer: Medicare Other | Source: Ambulatory Visit | Attending: Cardiovascular Disease | Admitting: Cardiovascular Disease

## 2021-06-21 DIAGNOSIS — R072 Precordial pain: Secondary | ICD-10-CM | POA: Insufficient documentation

## 2021-06-21 LAB — NM MYOCAR MULTI W/SPECT W/WALL MOTION / EF
LV dias vol: 35 mL (ref 46–106)
LV sys vol: 11 mL
Nuc Stress EF: 69 %
Peak HR: 123 {beats}/min
Percent HR: 83 %
Rest HR: 102 {beats}/min
Rest Nuclear Isotope Dose: 10.3 mCi
SDS: 0
SRS: 6
SSS: 4
ST Depression (mm): 0 mm
Stress Nuclear Isotope Dose: 31.2 mCi
TID: 0.96

## 2021-06-21 MED ORDER — TECHNETIUM TC 99M TETROFOSMIN IV KIT
10.0000 | PACK | Freq: Once | INTRAVENOUS | Status: AC
  Administered 2021-06-21: 10.33 via INTRAVENOUS

## 2021-06-21 MED ORDER — REGADENOSON 0.4 MG/5ML IV SOLN
0.4000 mg | Freq: Once | INTRAVENOUS | Status: AC
  Administered 2021-06-21: 0.4 mg via INTRAVENOUS

## 2021-06-21 MED ORDER — TECHNETIUM TC 99M TETROFOSMIN IV KIT
31.1500 | PACK | Freq: Once | INTRAVENOUS | Status: AC | PRN
  Administered 2021-06-21: 31.15 via INTRAVENOUS

## 2021-06-21 NOTE — Progress Notes (Deleted)
Cardiology Office Note:    Date:  06/23/2021   ID:  Kendra [name], DOB 1945-09-27, MRN [id number]  PCP:  [name], NP  [facility] Cardiologist:  None  [facility] Electrophysiologist:  None   Referring MD: [name], NP   Chief Complaint: F/u testing  History of Present Illness:    Kendra [name] is a 75 y.o. female with a hx of HLD, DM2, tobacco use, COPD, CVA 2021, carotid artery stenosis s/p bilateral carotid endarterectomies 2021 who presents with chest pain.   Echo 09/2019 showed LVEF 60-65%, LVH, mild MR  Seen 06/10/21 as a new patient. Had previous ED visit for chest pain. AT follow-up the paient denied further chest pain episodes. Myoview Lexiscan was ordered, which showed no significant ischemia or scar normal LVEF, coronary calcification and aortic atherosclerosis noted on CT attenuation, low risk.   Today Aspirin  Past Medical History:  Diagnosis Date   Asthma    Back pain    Cataract    Cervical disc disorder    COPD (chronic obstructive pulmonary disease) (HCC)    Depression    Diabetes (HCC)    type II   Diabetic neuropathy (HCC)    Dyspnea    with exertion   GERD (gastroesophageal reflux disease)    History of kidney stones    per patient "can't remember the year"   Hyperlipidemia    Hypertension    per patient "take meds for it"   Insomnia    Neuropathy    Osteoarthritis    Osteopenia    Pneumonia 2018   per patient   Reflux    Rheumatoid arthritis ([place])    Stroke ([place]) 2015   and again 2017  - Weakness in left leg, staggers w/ walking, vision    Tenosynovitis     Past Surgical History:  Procedure Laterality Date   BREAST BIOPSY Right    Fatty Tumor   ENDARTERECTOMY Right 10/21/2019   Procedure: ENDARTERECTOMY CAROTID RIGHT;  Surgeon: [name], MD;  Location: [place];  Service: Vascular;  Laterality: Right;   ENDARTERECTOMY Left 12/30/2019   Procedure: LEFT CAROTID ENDARTERECTOMY;  Surgeon: [name], MD;  Location: [place];  Service: Vascular;  Laterality: Left;   NECK SURGERY     OVARIAN CYST REMOVAL     PATCH ANGIOPLASTY Right 10/21/2019   Procedure: Patch Angioplasty Using XenoSure Biologic Patch Right Carotid;  Surgeon: [name], MD;  Location: [place];  Service: Vascular;  Laterality: Right;   PATCH ANGIOPLASTY Left 12/30/2019   Procedure: Patch Angioplasty Left Carotid;  Surgeon: [name], MD;  Location: [facility] OR;  Service: Vascular;  Laterality: Left;    Current Medications: No outpatient medications have been marked as taking for the 06/23/21 encounter (Appointment) with Kathlen Mody, Jorie Zee H, PA-C.     Allergies:   Citalopram   Social History   Socioeconomic History   Marital status: Widowed    Spouse name: Not on file   Number of children: Not on file   Years of education: Not on file   Highest education level: Not on file  Occupational History   Not on file  Tobacco Use   Smoking status: Some Days    Packs/day: 0.25    Years: 61.00    Pack years: 15.25    Types: Cigarettes   Smokeless tobacco: Former    Types: Snuff  Vaping Use   Vaping Use: Never used  Substance and Sexual  Activity   Alcohol use: No   Drug use: No   Sexual activity: Never  Other Topics Concern   Not on file  Social History Narrative   Not on file   Social Determinants of Health   Financial Resource Strain: Low Risk    Difficulty of Paying Living Expenses: Not very hard  Food Insecurity: No Food Insecurity   Worried About Running Out of Food in the Last Year: Never true   Ran Out of Food in the Last Year: Never true  Transportation Needs: No Transportation Needs   Lack of Transportation (Medical): No   Lack of Transportation (Non-Medical): No  Physical Activity: Inactive   Days of Exercise per Week: 0 days   Minutes of Exercise per Session: 0 min  Stress: No Stress Concern Present   Feeling of Stress : Only a little  Social Connections:  Socially Isolated   Frequency of Communication with Friends and Family: More than three times a week   Frequency of Social Gatherings with Friends and Family: More than three times a week   Attends Religious Services: Never   Marine scientist or Organizations: No   Attends Archivist Meetings: Never   Marital Status: Widowed     Family History: The patient's family history includes Breast cancer in her mother; Depression in her father; Diabetes in her mother; Heart murmur in her son, son, and son; Hyperlipidemia in her mother; Stroke in her father.  ROS:   Please see the history of present illness.     All other systems reviewed and are negative.  EKGs/Labs/Other Studies Reviewed:    The following studies were reviewed today: Myoview Lexiscan 06/2021 Narrative & Impression      Normal pharmacologic myocardial perfusion stress test without significant ischemia or scar.   Left ventricular function is normal (EF > 65%).   Coronary artery calcification and aortic atherosclerosis are noted on the attenuation correction CT.   The study is low risk.    Echo 09/2019 1. Left ventricular ejection fraction, by visual estimation, is 60 to  65%. The left ventricle has normal function. There is mildly increased  left ventricular hypertrophy.   2. The left ventricle has no regional wall motion abnormalities.   3. Global right ventricle has normal systolic function.The right  ventricular size is normal. No increase in right ventricular wall  thickness.   4. Left atrial size was normal.   5. Right atrial size was normal.   6. The mitral valve is normal in structure. Mild mitral valve  regurgitation. No evidence of mitral stenosis.   7. The tricuspid valve is normal in structure.   8. The tricuspid valve is normal in structure. Tricuspid valve  regurgitation is trivial.   9. The aortic valve is normal in structure. Aortic valve regurgitation is  not visualized. Mild aortic  valve sclerosis without stenosis.  10. The pulmonic valve was normal in structure. Pulmonic valve  regurgitation is trivial.  11. Normal pulmonary artery systolic pressure.  12. The inferior vena cava is normal in size with greater than 50%  respiratory variability, suggesting right atrial pressure of 3 mmHg.   EKG:  EKG is *** ordered today.  The ekg ordered today demonstrates ***  Recent Labs: 12/21/2020: TSH 0.68 06/06/2021: ALT 19; BUN 24; Creatinine, Ser 0.80; Hemoglobin 11.6; Platelets 228; Potassium 4.1; Sodium 139  Recent Lipid Panel    Component Value Date/Time   CHOL 92 12/21/2020 1134   CHOL 177 06/07/2015  1215   CHOL 141 09/02/2014 0440   TRIG 70 12/21/2020 1134   TRIG 155 09/02/2014 0440   HDL 51 12/21/2020 1134   HDL 56 06/07/2015 1215   HDL 37 (L) 09/02/2014 0440   CHOLHDL 1.8 12/21/2020 1134   VLDL 16 10/17/2019 0606   VLDL 31 09/02/2014 0440   LDLCALC 26 12/21/2020 1134   LDLCALC 73 09/02/2014 0440     Risk Assessment/Calculations:   {Does this patient have ATRIAL FIBRILLATION?[phone]}   Physical Exam:    VS:  There were no vitals taken for this visit.    Wt Readings from Last 3 Encounters:  06/20/21 131 lb (59.4 kg)  06/10/21 130 lb (59 kg)  05/31/21 129 lb 9.6 oz (58.8 kg)     GEN: *** Well nourished, well developed in no acute distress HEENT: Normal NECK: No JVD; No carotid bruits LYMPHATICS: No lymphadenopathy CARDIAC: ***RRR, no murmurs, rubs, gallops RESPIRATORY:  Clear to auscultation without rales, wheezing or rhonchi  ABDOMEN: Soft, non-tender, non-distended MUSCULOSKELETAL:  No edema; No deformity  SKIN: Warm and dry NEUROLOGIC:  Alert and oriented x 3 PSYCHIATRIC:  Normal affect   ASSESSMENT:    1. Coronary artery disease involving native coronary artery of native heart without angina pectoris   2. Dyspnea on exertion   3. Smoking    PLAN:    In order of problems listed above:  Coronary artery calcification noted chest  CT  Dyspnea, HLD  Current smoker  Disposition: Follow up {follow up:15908} with ***   Shared Decision Making/Informed Consent   {Are you ordering a CV Procedure (e.g. stress test, cath, DCCV, TEE, etc)?   Press F2        :[id number]}    Signed, Jearldine Cassady Ninfa Meeker, PA-C  06/23/2021 7:46 AM    [facility]

## 2021-06-22 ENCOUNTER — Ambulatory Visit (INDEPENDENT_AMBULATORY_CARE_PROVIDER_SITE_OTHER): Payer: Medicare Other | Admitting: Pharmacist

## 2021-06-22 DIAGNOSIS — E118 Type 2 diabetes mellitus with unspecified complications: Secondary | ICD-10-CM

## 2021-06-22 DIAGNOSIS — E1169 Type 2 diabetes mellitus with other specified complication: Secondary | ICD-10-CM

## 2021-06-22 NOTE — Chronic Care Management (AMB) (Signed)
Chronic Care Management Pharmacy Note  06/22/2021 Name:  Kendra [name] MRN:  [id number] DOB:  06-14-1946   Subjective: Kendra [name] is an 75 y.o. year old female who is a primary patient of Kendra Fenton, NP.  The CCM team was consulted for assistance with disease management and care coordination needs.    Engaged with patient by telephone for follow up visit in response to provider referral for pharmacy case management and/or care coordination services.   Consent to Services:  The patient was given information about Chronic Care Management services, agreed to services, and gave verbal consent prior to initiation of services.  Please see initial visit note for detailed documentation.   Patient Care Team: Kendra Fenton, NP as PCP - General (Internal Medicine) Steele Sizer, MD as Attending Physician (Family Medicine) Christene Lye, MD (General Surgery) Curley Spice Virl Diamond, RPH-CPP as Pharmacist Vanita Ingles, RN as Case Manager ([place]) [place], [name], FNP as Nurse Practitioner (Family Medicine) [name], LCSW as Social Worker (Licensed Clinical Social Worker)  Recent office visits: Office Visit with PCP on 10/3 for right shoulder pain  Recent consult visits: Office Visit with [place] on 9/23 for follow up of ED visit for chest pain.   Hospital visits: Patient seen in [name] ED 8/3-8/4 for chest pain  Objective:  Lab Results  Component Value Date   CREATININE 0.80 06/06/2021   CREATININE 0.72 05/31/2021   CREATININE 0.91 04/20/2021    Lab Results  Component Value Date   HGBA1C 6.7 (H) 05/31/2021   Last diabetic Eye exam: No results found for: HMDIABEYEEXA  Last diabetic Foot exam: No results found for: HMDIABFOOTEX      Component Value Date/Time   CHOL 92 12/21/2020 1134   CHOL 177 06/07/2015 1215   CHOL 141 09/02/2014 0440   TRIG 70 12/21/2020 1134   TRIG 155 09/02/2014 0440   HDL 51  12/21/2020 1134   HDL 56 06/07/2015 1215   HDL 37 (L) 09/02/2014 0440   CHOLHDL 1.8 12/21/2020 1134   VLDL 16 10/17/2019 0606   VLDL 31 09/02/2014 0440   LDLCALC 26 12/21/2020 1134   LDLCALC 73 09/02/2014 0440    Hepatic Function Latest Ref Rng & Units 06/06/2021 05/31/2021 04/20/2021  Total Protein 6.5 - 8.1 g/dL 7.6 7.0 7.6  Albumin 3.5 - 5.0 g/dL 4.2 - 4.1  AST 15 - 41 U/L $Remo'17 14 20  'xmuOF$ ALT 0 - 44 U/L $Remo'19 15 16  'MXIJd$ Alk Phosphatase 38 - 126 U/L 83 - 98  Total Bilirubin 0.3 - 1.2 mg/dL 0.7 0.3 0.6    Social History   Tobacco Use  Smoking Status Some Days   Packs/day: 0.25   Years: 61.00   Pack years: 15.25   Types: Cigarettes  Smokeless Tobacco Former   Types: Snuff   BP Readings from Last 3 Encounters:  06/20/21 137/83  06/10/21 130/76  05/31/21 139/76   Pulse Readings from Last 3 Encounters:  06/20/21 99  06/10/21 84  05/31/21 88   Wt Readings from Last 3 Encounters:  06/20/21 131 lb (59.4 kg)  06/10/21 130 lb (59 kg)  05/31/21 129 lb 9.6 oz (58.8 kg)    Assessment: Review of patient past medical history, allergies, medications, health status, including review of consultants reports, laboratory and other test data, was performed as part of comprehensive evaluation and provision of chronic care management services.   SDOH:  (Social Determinants of Health) assessments and  interventions performed:    CCM Care Plan  Allergies  Allergen Reactions   Citalopram Nausea And Vomiting    Medications Reviewed Today     Reviewed by Kendra Fenton, NP (Nurse Practitioner) on 06/20/21 at [phone]  Med List Status: <None>   Medication Order Taking? Sig Documenting Provider Last Dose Status Informant  Acetaminophen (TYLENOL 8 HOUR ARTHRITIS PAIN PO) [id number] Yes Take 2 capsules by mouth in the morning, at noon, in the evening, and at bedtime. [provider] Taking Active Multiple Informants  albuterol (VENTOLIN HFA) 108 (90 Base) MCG/ACT inhaler [id number] Yes INHALE 1-2  PUFFS BY MOUTH EVERY 6 HOURS AS NEEDED WHEEZING/ SHORTNESS OF BREATH Kendra Fenton, NP Taking Active   aspirin EC 81 MG tablet [id number] Yes Take 1 tablet (81 mg total) by mouth daily. Swallow whole. Minna Merritts, MD Taking Active   atorvastatin (LIPITOR) 40 MG tablet [id number] Yes TAKE 1 TABLET BY MOUTH BEDTIME Kendra Fenton, NP Taking Active   Blood Glucose Monitoring Suppl [employer] VERIO) w/Device Drucie Opitz [id number] Yes USE AS DIRECTED [name], FNP Taking Active Multiple Informants  buPROPion (WELLBUTRIN XL) 150 MG 24 hr tablet [id number] Yes TAKE 1 TABLET BY MOUTH ONCE DAILY [name], Coralie Keens, NP Taking Active Multiple Informants  clopidogrel (PLAVIX) 75 MG tablet [id number] Yes Take 1 tablet (75 mg total) by mouth daily. Minna Merritts, MD Taking Active   cyclobenzaprine (FLEXERIL) 5 MG tablet [id number] Yes Take 1 tablet (5 mg total) by mouth 3 (three) times daily as needed for muscle spasms. Kendra Fenton, NP Taking Active   docusate sodium (COLACE) 100 MG capsule [id number] Yes Take 1 capsule (100 mg total) by mouth 2 (two) times daily as needed for mild constipation. Kendra Fenton, NP Taking Active   glucose blood [facility] VERIO) test strip [id number] Yes Use as instructed Olin Hauser, DO Taking Active Multiple Informants  Lancet Devices (ONE TOUCH DELICA LANCING DEV) MISC [id number] Yes 1 Device by Does not apply route 2 (two) times daily. [name], FNP Taking Active Multiple Informants  OneTouch Delica Lancets 90W MISC [id number] Yes 1 Device by Does not apply route 2 (two) times daily. Olin Hauser, DO Taking Active Multiple Informants  predniSONE (DELTASONE) 10 MG tablet [id number] Yes Take 3 tabs on days 1-3, 2 tabs on days 4-6, 1 tab on days 7-9 [name], Regina W, NP  Active   traZODone (DESYREL) 50 MG tablet [id number] Yes Take 0.5-1 tablets (25-50 mg total) by mouth at bedtime as needed for sleep. Kendra Fenton, NP Taking Active Multiple  Informants  TRELEGY ELLIPTA 100-62.5-25 MCG/INH AEPB [id number] Yes USE ONE INHALATION INTO THE LUNGS ONCE A DAY RINSE MOUTH AFTER EACH USE Kendra Fenton, NP Taking Active             Patient Active Problem List   Diagnosis Date Noted   Aortic atherosclerosis ([place]) 05/31/2021   COPD (chronic obstructive pulmonary disease) ([place]) 05/31/2021   Anemia 05/31/2021   Hemiparesis affecting left side as late effect of stroke ([place]) 02/02/2021   Recurrent major depressive disorder, in partial remission ([place]) 02/02/2021   Carotid artery stenosis 12/30/2019   Hypertension associated with diabetes ([place]) 12/19/2018   Diabetes mellitus type 2, controlled, with complications ([place]) 05/01/4817   Insomnia 03/30/2015   Hyperlipidemia associated with type 2 diabetes mellitus ([place]) 03/30/2015   Gastro-esophageal reflux disease without esophagitis 03/30/2015   History of cerebrovascular accident with residual deficit 03/30/2015   Osteoarthritis  03/30/2015   Osteopenia 03/30/2015   B12 deficiency 03/30/2015    Immunization History  Administered Date(s) Administered   Fluad Quad(high Dose 65+) 05/21/2019, 05/31/2021   Influenza Split 06/20/2012   Influenza, High Dose Seasonal PF 07/26/2016, 06/14/2017, 07/31/2018   Influenza, Seasonal, Injecte, Preservative Fre 08/04/2010   Influenza,inj,Quad PF,6+ Mos 06/10/2013, 05/26/2014, 06/07/2015   Influenza-Unspecified 05/26/2014, 06/18/2018   PFIZER(Purple Top)SARS-COV-2 Vaccination 03/02/2020, 03/30/2020   Pneumococcal Conjugate-13 10/31/2007, 10/21/2014   Pneumococcal Polysaccharide-23 10/31/2007, 06/20/2012, 07/31/2018   Tdap 08/04/2010   Zoster, Live 06/10/2013    Conditions to be addressed/monitored: HTN, HLD, COPD, and DMII  Care Plan : PharmD - Medication Mgmt  Updates made by Rennis Petty, RPH-CPP since 06/22/2021 12:00 AM     Problem: Disease Progression      Long-Range Goal: Disease Progression Prevented or Minimized   Start  Date: 11/01/2020  Expected End Date: 01/30/2021  This Visit's Progress: On track  Recent Progress: On track  Priority: High  Note:   Current Barriers:  Financial Barriers Limited social support  Pharmacist Clinical Goal(s):  Over the next 90 days, patient will maintain adherence to monitoring guidelines and medication adherence to achieve therapeutic efficacy through collaboration with PharmD and provider.   Interventions: 1:1 collaboration with Webb Silversmith, NP regarding development and update of comprehensive plan of care as evidenced by provider attestation and co-signature Inter-disciplinary care team collaboration (see longitudinal plan of care) Perform chart review.  Office Visit with PCP on 10/3 for right shoulder pain. Provider advised: Rx given for prednisone taper over 9 days Monitor blood sugars Shoulder exercises given Apply ice for 10 minutes 3 x day Office Visit with [facility] on 9/23 for follow up of ED visit for chest pain. Provider advised: Obtain Lexiscan Myoview to evaluate any significant ischemia Reduce aspirin to 81 mg daily Continue Plavix 75 mg daily and Lipitor Encourage patient to follow up to make sure has transportation for Cardiology tomorrow Reports interested in following up with Care Guide regarding alternative transportation options but does not have her number. Provide patient with phone number for Care Guide Today reports pain and movement of shoulder has improved since visit with PCP Confirms icing shoulder as directed Encourage patient to complete shoulder exercises from PCP Counsel patient on prednisone taper, including reviewing taper direction and administration. Encourage patient to take doses in the morning with food.  T2DM: Current treatment: none Denies checking home blood sugar recently Encourage patient to monitor home blood sugars while on prednisone course and contact office for readings outside of established  parameters   HTN: Current treatment: none Does not have recent blood pressure readings to provide Encourage patient to keep a log when she monitors and bring record with her to medical appointments  Medication Management/adherence: Reports continues to take medication from pill pack as packaged by [name] Denies missed doses Identify therapeutic duplication: patient currently taking aspirin 81 mg daily from bottle per direction from Cardiologist, but aspirin 325 mg daily still in pill pack from [facility] From review of medication list in chart, note OTC aspirin switched to 81 mg daily on medication list by Cardiology, but Rx did not transmit to [name]. Call [name] today with patient on the line Advise RPh Sam that patient's Cardiologist at [facility] has changed patient's aspirin dose from 325 mg daily to 81 mg daily per chart.  RPh Sam states that he will update patient's medication list in the [name] record and patient will receive  aspirin 81 mg, rather than 325 mg, daily in next pill pack [name] RPh and patient discuss how to manage aspirin change for current pill pack. Sam verbally assists patient with identifying aspirin 325 mg (large orange tablet) in her pill pack and they both agree that for now she will discard the aspirin 325 mg from pill pack each day and take the aspirin 81 mg from bottle along with the rest of her morning medication from the pack until new pack arrives. Encourage patient to write down the instructions that she received from [name] and call him back with any questions/concerns   Patient Goals/Self-Care Activities Over the next 90 days, patient will:  - take medications as prescribed  Using pill packaging from [facility] - check glucose, document, and provide at future appointments - check blood pressure, document, and provide at future appointments  Follow Up Plan: Telephone follow up appointment with care management team  member scheduled for: 07/20/2021 at 9:15 AM      Patient's preferred pharmacy is:  [facility], [address] [place] [place] [address] Phone: [phone] Fax: [phone]  [id number] - Closed - [place], [place] [address] [place] [address] Phone: [phone] Fax: [phone]  [facility] - [place], [place] [address] Phone: [phone] Fax: [phone]   Follow Up:  Patient agrees to Care Plan and Follow-up.  Wallace Cullens, PharmD, [place], CPP Clinical Pharmacist [facility] [phone]

## 2021-06-22 NOTE — Patient Instructions (Addendum)
Visit Information  PATIENT GOALS:  Goals Addressed             This Visit's Progress    Pharmacy - Patient Goals       -Please continue to keep log of home blood sugar results and have for [place] to review during our calls -Please continue to keep log of blood pressure results and have for [place] to review during our calls  Our goal bad cholesterol, or LDL, is less than 70 . This is why it is important to continue taking your atorvastatin.  I look forward to talking to you during our next telephone appointment. Please call if you need something sooner!   Wallace Cullens, PharmD, [place] [phone]         The patient verbalized understanding of instructions, educational materials, and care plan provided today and declined offer to receive copy of patient instructions, educational materials, and care plan.   Telephone follow up appointment with care management team member scheduled for: 07/20/2021 at 9:15 AM

## 2021-06-22 NOTE — Addendum Note (Signed)
Addended by: [name] on: 06/22/2021 07:56 PM   Modules accepted: Orders

## 2021-06-23 ENCOUNTER — Ambulatory Visit: Payer: Medicare Other | Admitting: Medical

## 2021-06-27 ENCOUNTER — Telehealth: Payer: Self-pay

## 2021-06-27 ENCOUNTER — Telehealth: Payer: Self-pay | Admitting: *Deleted

## 2021-06-27 ENCOUNTER — Ambulatory Visit: Payer: Medicare Other | Admitting: Internal Medicine

## 2021-06-27 MED ORDER — ASPIRIN EC 81 MG PO TBEC: 81.0000 mg | DELAYED_RELEASE_TABLET | Freq: Every day | ORAL | 3 refills | Status: DC

## 2021-06-27 NOTE — Telephone Encounter (Signed)
Rx sent to [name] - Aspirin 81 mg daily

## 2021-06-27 NOTE — Telephone Encounter (Signed)
-----   Message from [place], PA-C sent at 06/27/2021  1:15 PM EDT ----- Marykay Lex, can we change this? Or send in an rx to the right pharmacy. Thx  ----- Message ----- From: Rennis Petty, RPH-CPP Sent: 06/22/2021   2:55 PM EDT To: Cadence Ninfa Meeker, PA-C  Good afternoon,   I am the pharmacist embedded with patient's primary care office, [facility].   I see from the encounter from patient's visit with Dr. Garen Lah on 9/23 that patient was advised to decrease her aspirin dose to 81 mg daily. I see her medication list was updated, but Rx did not transmit to pharmacy. Ms. Welp receives her maintenance medications in pill packs from [facility] (including OTCs). I have let the pharmacy know, but during her visit with you tomorrow, would you mind sending an updated Rx to [facility] so that they have the aspirin dose change on file?  Thank you!  Wallace Cullens, PharmD, [place] [phone]

## 2021-06-27 NOTE — Telephone Encounter (Signed)
Able to reach pt regarding her recent Myoview Dr. Rockey Situ had a chance to review her results and advised   "Stress test  No significant ischemia  Normal ejection fraction  Low risk study  Coronary calcification noted "  Kendra [name] very thankful for the phone call of her results, all questions and concerns were address with nothing further at this time. Will see at next schedule f/u appt.

## 2021-06-28 ENCOUNTER — Other Ambulatory Visit: Payer: Self-pay

## 2021-06-28 ENCOUNTER — Inpatient Hospital Stay: Payer: Medicare Other

## 2021-06-28 ENCOUNTER — Inpatient Hospital Stay: Payer: Medicare Other | Attending: Oncology | Admitting: Oncology

## 2021-06-28 ENCOUNTER — Encounter: Payer: Self-pay | Admitting: Oncology

## 2021-06-28 VITALS — BP 119/78 | HR 94 | Temp 98.0°F | Resp 18 | Wt 129.5 lb

## 2021-06-28 DIAGNOSIS — R803 Bence Jones proteinuria: Secondary | ICD-10-CM

## 2021-06-28 DIAGNOSIS — E538 Deficiency of other specified B group vitamins: Secondary | ICD-10-CM

## 2021-06-28 DIAGNOSIS — D649 Anemia, unspecified: Secondary | ICD-10-CM | POA: Diagnosis not present

## 2021-06-28 DIAGNOSIS — Z87891 Personal history of nicotine dependence: Secondary | ICD-10-CM

## 2021-06-28 DIAGNOSIS — Z79899 Other long term (current) drug therapy: Secondary | ICD-10-CM | POA: Insufficient documentation

## 2021-06-28 MED ORDER — CYANOCOBALAMIN 1000 MCG/ML IJ SOLN
1000.0000 ug | Freq: Once | INTRAMUSCULAR | Status: AC
Start: 2021-06-28 — End: 2021-06-28
  Administered 2021-06-28: 1000 ug via INTRAMUSCULAR
  Filled 2021-06-28: qty 1

## 2021-06-28 NOTE — Progress Notes (Signed)
Pt here for follow up. No new concerns voiced.   

## 2021-06-28 NOTE — Progress Notes (Signed)
Hematology/Oncology Consult note [facility] Telephone:([phone] [phone]   Patient Care Team: [name], NP as PCP - General (Internal Medicine) Steele Sizer, MD as Attending Physician (Family Medicine) Christene Lye, MD (General Surgery) Curley Spice Virl Diamond, Mayetta as Pharmacist Vanita Ingles, RN as Case Manager ([place]) Malfi, [name], FNP as Nurse Practitioner (Family Medicine) [name], LCSW as Social Worker (Licensed Clinical Social Worker)  REFERRING PROVIDER: [name], NP  CHIEF COMPLAINTS/REASON FOR VISIT:  Follow up for anemia, bence jones protein   HISTORY OF PRESENTING ILLNESS:  Kendra [name] is a  75 y.o.  female with PMH listed below who was referred to me for evaluation of anemia Reviewed patient's recent labs that was done.  06/14/20 Labs revealed anemia with hemoglobin of 11.2  Reviewed patient's previous labs ordered by primary care physician's office, anemia is chronic onset , duration is since Jan 2021 No aggravating or improving factors.  Associated signs and symptoms: Patient reports fatigue. deneis  SOB with exertion.  Denies  easy bruising, hematochezia, hemoptysis, hematuria. She has had lost weight over the past 2 years. Context: History of GI bleeding: denies               History of Chronic kidney disease: deneis               History of autoimmune disease: Rheumatoid arthritis               Last colonoscopy: 2007                Extensive smoking history   INTERVAL HISTORY Kendra [name] is a 75 y.o. female who has above history reviewed by me today presents for follow up visit for management of anemia Problems and complaints are listed below: Patient presents for follow-up.  Patient denies any new complaints.  She forgot to submit urine specimen after last visit.  Review of Systems  Constitutional:  Positive for fatigue. Negative for appetite change, chills  and fever.  HENT:   Negative for hearing loss and voice change.   Eyes:  Negative for eye problems.  Respiratory:  Negative for chest tightness, cough and wheezing.   Cardiovascular:  Negative for chest pain.  Gastrointestinal:  Negative for abdominal distention, abdominal pain and blood in stool.  Endocrine: Negative for hot flashes.  Genitourinary:  Negative for difficulty urinating and frequency.   Musculoskeletal:  Positive for arthralgias.  Skin:  Negative for itching and rash.  Neurological:  Negative for extremity weakness.  Hematological:  Negative for adenopathy.  Psychiatric/Behavioral:  Negative for confusion.     MEDICAL HISTORY:  Past Medical History:  Diagnosis Date   Asthma    Back pain    Cataract    Cervical disc disorder    COPD (chronic obstructive pulmonary disease) (HCC)    Depression    Diabetes (HCC)    type II   Diabetic neuropathy (HCC)    Dyspnea    with exertion   GERD (gastroesophageal reflux disease)    History of kidney stones    per patient "can't remember the year"   Hyperlipidemia    Hypertension    per patient "take meds for it"   Insomnia    Neuropathy    Osteoarthritis    Osteopenia    Pneumonia 2018   per patient   Reflux    Rheumatoid arthritis ([place])    Stroke ([place]) 2015   and again  2017  - Weakness in left leg, staggers w/ walking, vision    Tenosynovitis     SURGICAL HISTORY: Past Surgical History:  Procedure Laterality Date   BREAST BIOPSY Right    Fatty Tumor   ENDARTERECTOMY Right 10/21/2019   Procedure: ENDARTERECTOMY CAROTID RIGHT;  Surgeon: [name], MD;  Location: [place];  Service: Vascular;  Laterality: Right;   ENDARTERECTOMY Left 12/30/2019   Procedure: LEFT CAROTID ENDARTERECTOMY;  Surgeon: [name], MD;  Location: [place];  Service: Vascular;  Laterality: Left;   NECK SURGERY     OVARIAN CYST REMOVAL     PATCH ANGIOPLASTY Right 10/21/2019   Procedure: Patch Angioplasty Using  XenoSure Biologic Patch Right Carotid;  Surgeon: [name], MD;  Location: [place];  Service: Vascular;  Laterality: Right;   PATCH ANGIOPLASTY Left 12/30/2019   Procedure: Patch Angioplasty Left Carotid;  Surgeon: [name], MD;  Location: [place];  Service: Vascular;  Laterality: Left;    SOCIAL HISTORY: Social History   Socioeconomic History   Marital status: Widowed    Spouse name: Not on file   Number of children: Not on file   Years of education: Not on file   Highest education level: Not on file  Occupational History   Not on file  Tobacco Use   Smoking status: Some Days    Packs/day: 0.25    Years: 61.00    Pack years: 15.25    Types: Cigarettes   Smokeless tobacco: Former    Types: Snuff  Vaping Use   Vaping Use: Never used  Substance and Sexual Activity   Alcohol use: No   Drug use: No   Sexual activity: Never  Other Topics Concern   Not on file  Social History Narrative   Not on file   Social Determinants of Health   Financial Resource Strain: Low Risk    Difficulty of Paying Living Expenses: Not very hard  Food Insecurity: No Food Insecurity   Worried About Charity fundraiser in the Last Year: Never true   [place] in the Last Year: Never true  Transportation Needs: No Transportation Needs   Lack of Transportation (Medical): No   Lack of Transportation (Non-Medical): No  Physical Activity: Inactive   Days of Exercise per Week: 0 days   Minutes of Exercise per Session: 0 min  Stress: No Stress Concern Present   Feeling of Stress : Only a little  Social Connections: Socially Isolated   Frequency of Communication with Friends and Family: More than three times a week   Frequency of Social Gatherings with Friends and Family: More than three times a week   Attends Religious Services: Never   Marine scientist or Organizations: No   Attends Archivist Meetings: Never   Marital Status: Widowed  Arboriculturist Violence: Not At Risk   Fear of Current or Ex-Partner: No   Emotionally Abused: No   Physically Abused: No   Sexually Abused: No    FAMILY HISTORY: Family History  Problem Relation Age of Onset   Stroke Father    Depression Father    Diabetes Mother    Hyperlipidemia Mother    Breast cancer Mother    Heart murmur Son    Heart murmur Son    Heart murmur Son     ALLERGIES:  is allergic to citalopram.  MEDICATIONS:  Current Outpatient Medications  Medication Sig Dispense Refill   Acetaminophen (TYLENOL  8 HOUR ARTHRITIS PAIN PO) Take 2 capsules by mouth in the morning, at noon, in the evening, and at bedtime.     albuterol (VENTOLIN HFA) 108 (90 Base) MCG/ACT inhaler INHALE 1-2 PUFFS BY MOUTH EVERY 6 HOURS AS NEEDED WHEEZING/ SHORTNESS OF BREATH 8.5 g 1   aspirin EC 81 MG tablet Take 1 tablet (81 mg total) by mouth daily. Swallow whole. 90 tablet 3   atorvastatin (LIPITOR) 40 MG tablet TAKE 1 TABLET BY MOUTH BEDTIME 90 tablet 0   Blood Glucose Monitoring Suppl (ONETOUCH VERIO) w/Device KIT USE AS DIRECTED 1 kit 0   buPROPion (WELLBUTRIN XL) 150 MG 24 hr tablet TAKE 1 TABLET BY MOUTH ONCE DAILY 90 tablet 0   clopidogrel (PLAVIX) 75 MG tablet Take 1 tablet (75 mg total) by mouth daily.     cyclobenzaprine (FLEXERIL) 5 MG tablet Take 1 tablet (5 mg total) by mouth 3 (three) times daily as needed for muscle spasms. 30 tablet 2   glucose blood (ONETOUCH VERIO) test strip Use as instructed 200 each 4   Lancet Devices (ONE TOUCH DELICA LANCING DEV) MISC 1 Device by Does not apply route 2 (two) times daily. 200 each 4   OneTouch Delica Lancets 86V MISC 1 Device by Does not apply route 2 (two) times daily. 200 each 4   predniSONE (DELTASONE) 10 MG tablet Take 3 tabs on days 1-3, 2 tabs on days 4-6, 1 tab on days 7-9 18 tablet 0   TRELEGY ELLIPTA 100-62.5-25 MCG/INH AEPB USE ONE INHALATION INTO THE LUNGS ONCE A DAY RINSE MOUTH AFTER EACH USE 60 each 1   docusate sodium (COLACE) 100 MG  capsule Take 1 capsule (100 mg total) by mouth 2 (two) times daily as needed for mild constipation. (Patient not taking: Reported on 06/28/2021) 60 capsule 2   traZODone (DESYREL) 50 MG tablet Take 0.5-1 tablets (25-50 mg total) by mouth at bedtime as needed for sleep. (Patient not taking: Reported on 06/28/2021) 90 tablet 0   No current facility-administered medications for this visit.     PHYSICAL EXAMINATION: ECOG PERFORMANCE STATUS: 1 - Symptomatic but completely ambulatory Vitals:   06/28/21 1313  BP: 119/78  Pulse: 94  Resp: 18  Temp: 98 F (36.7 C)   Filed Weights   06/28/21 1313  Weight: 129 lb 8 oz (58.7 kg)    Physical Exam Constitutional:      General: She is not in acute distress.    Comments: Thin built  HENT:     Head: Normocephalic and atraumatic.  Eyes:     General: No scleral icterus. Cardiovascular:     Rate and Rhythm: Normal rate and regular rhythm.     Heart sounds: Normal heart sounds.  Pulmonary:     Effort: Pulmonary effort is normal. No respiratory distress.     Breath sounds: No wheezing.  Abdominal:     General: Bowel sounds are normal. There is no distension.     Palpations: Abdomen is soft.  Musculoskeletal:        General: No deformity. Normal range of motion.     Cervical back: Normal range of motion and neck supple.  Skin:    General: Skin is warm and dry.     Findings: No erythema or rash.  Neurological:     Mental Status: She is alert. Mental status is at baseline.     Cranial Nerves: No cranial nerve deficit.     Coordination: Coordination normal.  Psychiatric:  Mood and Affect: Mood normal.     LABORATORY DATA:  I have reviewed the data as listed Lab Results  Component Value Date   WBC 4.9 06/06/2021   HGB 11.6 (L) 06/06/2021   HCT 35.7 (L) 06/06/2021   MCV 97.5 06/06/2021   PLT 228 06/06/2021   Recent Labs    02/01/21 1310 04/20/21 2155 05/31/21 1406 06/06/21 1314  NA 137 138 140 139  K 4.1 3.9 4.1 4.1   CL 102 104 104 103  CO2 $Re'27 25 29 30  'PSr$ GLUCOSE 138* 175* 100 126*  BUN $Re'13 18 11 'Qvt$ 24*  CREATININE 0.82 0.91 0.72 0.80  CALCIUM 9.5 9.5 9.9 9.6  GFRNONAA >60 >60  --  >60  PROT 7.0 7.6 7.0 7.6  ALBUMIN 3.9 4.1  --  4.2  AST $Re'17 20 14 17  'LUr$ ALT $R'18 16 15 19  'zs$ ALKPHOS 64 98  --  83  BILITOT 0.5 0.6 0.3 0.7    Iron/TIBC/Ferritin/ %Sat    Component Value Date/Time   IRON 65 07/20/2020 1018   TIBC 395 07/20/2020 1018   FERRITIN 33 07/20/2020 1018   IRONPCTSAT 17 07/20/2020 1018        ASSESSMENT & PLAN:  1. Bence Jones protein present in urine   2. B12 deficiency   3. 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 Labs reviewed and discussed with patient Free light chain ratio is stable.  M protein negative. Recommend patient to have 24-hour urine protein electrophoresis done. Continue observation.  Vitamin B12 deficiency, B12 level has improved.  Continue vitamin B12 injection every 3 months.  # extensive smoking history: smoking cessation was discussed.  I have referred her to lung cancer screening program.  Orders Placed This Encounter  Procedures   IFE+PROTEIN ELECTRO, 24-HR UR    Standing Status:   Future    Standing Expiration Date:   06/28/2022   CBC with Differential/Platelet    Standing Status:   Future    Standing Expiration Date:   06/28/2022   Comprehensive metabolic panel    Standing Status:   Future    Standing Expiration Date:   06/28/2022   Vitamin B12    Standing Status:   Future    Standing Expiration Date:   06/28/2022   Kappa/lambda light chains    Standing Status:   Future    Standing Expiration Date:   06/28/2022   Multiple Myeloma Panel (SPEP&IFE w/QIG)    Standing Status:   Future    Standing Expiration Date:   06/28/2022   IFE+PROTEIN ELECTRO, 24-HR UR    Standing Status:   Future    Standing Expiration Date:   06/28/2022    All questions  were answered. The patient knows to call the clinic with any problems questions or concerns. Cc. [name], NP  Return of visit: 6 months  Earlie Server, MD, PhD 06/28/2021

## 2021-07-01 ENCOUNTER — Ambulatory Visit: Payer: Self-pay

## 2021-07-01 NOTE — Telephone Encounter (Signed)
Called [name] about pt.'s Colace. States Colace is not covered by insurance, so pt. Would have to pay out of pocket. Pt. Notified of this, verbalizes understanding.

## 2021-07-01 NOTE — Telephone Encounter (Signed)
Pt called and is requesting to have her stool softeners sent in. She states that she cant remember the name of this medication and is requesting to have a call back. Please advise.   Pt. Reports her pharmacy did not get "the refill on my stool softener." Nurse Will call pharmacy and verify. Pt. Verbalizes understanding.   Answer Assessment - Initial Assessment Questions 1. DRUG NAME: "What medicine do you need to have refilled?"     Colace 2. REFILLS REMAINING: "How many refills are remaining?" (Note: The label on the medicine or pill bottle will show how many refills are remaining. If there are no refills remaining, then a renewal may be needed.)     0 3. EXPIRATION DATE: "What is the expiration date?" (Note: The label states when the prescription will expire, and thus can no longer be refilled.)     N/a 4. PRESCRIBING HCP: "Who prescribed it?" Reason: If prescribed by specialist, call should be referred to that group.     [name] 5. SYMPTOMS: "Do you have any symptoms?"     N/a 6. PREGNANCY: "Is there any chance that you are pregnant?" "When was your last menstrual period?"     N/a  Protocols used: Medication Refill and Renewal Call-A-AH

## 2021-07-05 ENCOUNTER — Other Ambulatory Visit: Payer: Self-pay | Admitting: Internal Medicine

## 2021-07-05 NOTE — Telephone Encounter (Signed)
Requested Prescriptions  Pending Prescriptions Disp Refills  . buPROPion (WELLBUTRIN XL) 150 MG 24 hr tablet [Pharmacy Med Name: BUPROPION HCL ER (XL) 150 MG TAB] 90 tablet 0    Sig: TAKE 1 TABLET BY MOUTH ONCE DAILY     Psychiatry: Antidepressants - bupropion Passed - 07/05/2021 11:33 AM      Passed - Completed PHQ-2 or PHQ-9 in the last 360 days      Passed - Last BP in normal range    BP Readings from Last 1 Encounters:  06/28/21 119/78         Passed - Valid encounter within last 6 months    Recent Outpatient Visits          2 weeks ago Chronic right shoulder pain   [facility], Coralie Keens, NP   1 month ago Aortic atherosclerosis [name])   [name] [place], Coralie Keens, NP   4 months ago Closed fracture of multiple ribs of left side, initial encounter   [place], Coralie Keens, NP   5 months ago Left hand pain   [name] [place], [place] W, NP   6 months ago Uncontrolled type 2 diabetes mellitus with insulin therapy [facility])   [facility] Kathrine Haddock, NP      Future Appointments            In 1 month [name], Coralie Keens, NP [facility], [place]   In 1 month [place], Coralie Keens, NP [facility], [place]   In 4 months [place], Coralie Keens, NP [facility], [facility]

## 2021-07-06 DIAGNOSIS — M19011 Primary osteoarthritis, right shoulder: Secondary | ICD-10-CM | POA: Diagnosis not present

## 2021-07-06 DIAGNOSIS — E1142 Type 2 diabetes mellitus with diabetic polyneuropathy: Secondary | ICD-10-CM | POA: Diagnosis not present

## 2021-07-06 DIAGNOSIS — M7581 Other shoulder lesions, right shoulder: Secondary | ICD-10-CM | POA: Diagnosis not present

## 2021-07-10 ENCOUNTER — Other Ambulatory Visit: Payer: Self-pay | Admitting: Internal Medicine

## 2021-07-10 DIAGNOSIS — J449 Chronic obstructive pulmonary disease, unspecified: Secondary | ICD-10-CM

## 2021-07-11 ENCOUNTER — Other Ambulatory Visit: Payer: Self-pay | Admitting: Internal Medicine

## 2021-07-11 DIAGNOSIS — G4700 Insomnia, unspecified: Secondary | ICD-10-CM

## 2021-07-11 NOTE — Telephone Encounter (Signed)
Requested medications are due for refill today.  Yes for cyclobenzaprine, unknown for trazodone.  Requested medications are on the active medications list.  yes  Last refill. Cyclobenzaprine 05/31/2021, 04/05/2021 for trazodone  Future visit scheduled.   yes  Notes to clinic.  Cyclobenzaprine is not delegated.  Per note of 06/28/2021 by Dr. Tasia Catchings pt is not taking trazodone.

## 2021-07-11 NOTE — Telephone Encounter (Signed)
Medication Refill - Medication: Cyclobenzaprine, Trazodone   Has the patient contacted their pharmacy? Yes. Pt states that she was advised to contact PCP. Please advise.  (Agent: If no, request that the patient contact the pharmacy for the refill. If patient does not wish to contact the pharmacy document the reason why and proceed with request.) (Agent: If yes, when and what did the pharmacy advise?)  Preferred Pharmacy (with phone number or street name):  [name] [place], [place] [address]  Phone: [phone] Fax: [phone]  Hours: Not open 24 hours   Has the patient been seen for an appointment in the last year OR does the patient have an upcoming appointment? Yes.    Agent: Please be advised that RX refills may take up to 3 business days. We ask that you follow-up with your pharmacy.

## 2021-07-12 ENCOUNTER — Ambulatory Visit: Payer: Medicare Other | Admitting: Licensed Clinical Social Worker

## 2021-07-12 DIAGNOSIS — F3341 Major depressive disorder, recurrent, in partial remission: Secondary | ICD-10-CM

## 2021-07-12 DIAGNOSIS — I152 Hypertension secondary to endocrine disorders: Secondary | ICD-10-CM

## 2021-07-12 DIAGNOSIS — G4700 Insomnia, unspecified: Secondary | ICD-10-CM

## 2021-07-12 DIAGNOSIS — E1159 Type 2 diabetes mellitus with other circulatory complications: Secondary | ICD-10-CM

## 2021-07-12 DIAGNOSIS — E118 Type 2 diabetes mellitus with unspecified complications: Secondary | ICD-10-CM

## 2021-07-12 DIAGNOSIS — E1169 Type 2 diabetes mellitus with other specified complication: Secondary | ICD-10-CM

## 2021-07-12 DIAGNOSIS — E785 Hyperlipidemia, unspecified: Secondary | ICD-10-CM

## 2021-07-12 DIAGNOSIS — F5101 Primary insomnia: Secondary | ICD-10-CM

## 2021-07-12 DIAGNOSIS — J410 Simple chronic bronchitis: Secondary | ICD-10-CM

## 2021-07-17 NOTE — Chronic Care Management (AMB) (Signed)
Chronic Care Management    Clinical Social Work Note  07/17/2021 Name: Kendra [name] MRN: [id number] DOB: 08/16/1946  Kendra [name] is a 75 y.o. year old female who is a primary care patient of [name], NP. The CCM team was consulted to assist the patient with chronic disease management and/or care coordination needs related to: Mental Health Counseling and Resources.   Engaged with patient by telephone for follow up visit in response to provider referral for social work chronic care management and care coordination services.   Consent to Services:  The patient was given information about Chronic Care Management services, agreed to services, and gave verbal consent prior to initiation of services.  Please see initial visit note for detailed documentation.   Patient agreed to services and consent obtained.   Consent to Services:  The patient was given information about Care Management services, agreed to services, and gave verbal consent prior to initiation of services.  Please see initial visit note for detailed documentation.   Patient agreed to services today and consent obtained.  Engaged with patient by phone in response to provider referral for social work care coordination services:  Assessment/Interventions:  Patient continues to maintain positive progress with care plan goals. She reports management with MDD symptoms; however, shared difficulty managing pain in shoulder. Patient is interested in a referral to [facility] to assist with light cleaning and bathing. Patient requests a refill on Trazodone and Flexeril. States her Bayer Asprin 81 mg wasn't included in her bubble pack on 10/22.  See Care Plan below for interventions and patient self-care activities.  Recent life changes or stressors: Management of pain  Recommendation: Patient may benefit from, and is in agreement work with LCSW to address care coordination needs and will continue to work with  the clinical team to address health care and disease management related needs.   Follow up Plan: Patient would like continued follow-up from CCM LCSW .  per patient's request will follow up in 09/21/21.  Will call office if needed prior to next encounter.  SDOH (Social Determinants of Health) assessments and interventions performed:    Advanced Directives Status: Not addressed in this encounter.  CCM Care Plan  Allergies  Allergen Reactions   Citalopram Nausea And Vomiting    Outpatient Encounter Medications as of 07/12/2021  Medication Sig   Acetaminophen (TYLENOL 8 HOUR ARTHRITIS PAIN PO) Take 2 capsules by mouth in the morning, at noon, in the evening, and at bedtime.   albuterol (VENTOLIN HFA) 108 (90 Base) MCG/ACT inhaler INHALE 1-2 PUFFS BY MOUTH EVERY 6 HOURS AS NEEDED WHEEZING/ SHORTNESS OF BREATH   aspirin EC 81 MG tablet Take 1 tablet (81 mg total) by mouth daily. Swallow whole.   atorvastatin (LIPITOR) 40 MG tablet TAKE 1 TABLET BY MOUTH BEDTIME   Blood Glucose Monitoring Suppl (ONETOUCH VERIO) w/Device KIT USE AS DIRECTED   buPROPion (WELLBUTRIN XL) 150 MG 24 hr tablet TAKE 1 TABLET BY MOUTH ONCE DAILY   clopidogrel (PLAVIX) 75 MG tablet Take 1 tablet (75 mg total) by mouth daily.   cyclobenzaprine (FLEXERIL) 5 MG tablet Take 1 tablet (5 mg total) by mouth 3 (three) times daily as needed for muscle spasms.   docusate sodium (COLACE) 100 MG capsule Take 1 capsule (100 mg total) by mouth 2 (two) times daily as needed for mild constipation. (Patient not taking: Reported on 06/28/2021)   glucose blood (ONETOUCH VERIO) test strip Use as instructed   Lancet Devices (  ONE TOUCH DELICA LANCING DEV) MISC 1 Device by Does not apply route 2 (two) times daily.   OneTouch Delica Lancets 95A MISC 1 Device by Does not apply route 2 (two) times daily.   predniSONE (DELTASONE) 10 MG tablet Take 3 tabs on days 1-3, 2 tabs on days 4-6, 1 tab on days 7-9   traZODone (DESYREL) 50 MG tablet Take  0.5-1 tablets (25-50 mg total) by mouth at bedtime as needed for sleep. (Patient not taking: Reported on 06/28/2021)   TRELEGY ELLIPTA 100-62.5-25 MCG/INH AEPB USE ONE INHALATION INTO THE LUNGS ONCE A DAY RINSE MOUTH AFTER EACH USE   No facility-administered encounter medications on file as of 07/12/2021.    Patient Active Problem List   Diagnosis Date Noted   Aortic atherosclerosis ([place]) 05/31/2021   COPD (chronic obstructive pulmonary disease) ([place]) 05/31/2021   Anemia 05/31/2021   Hemiparesis affecting left side as late effect of stroke ([place]) 02/02/2021   Recurrent major depressive disorder, in partial remission ([place]) 02/02/2021   Carotid artery stenosis 12/30/2019   Hypertension associated with diabetes ([place]) 12/19/2018   Diabetes mellitus type 2, controlled, with complications ([place]) 21/30/8657   Insomnia 03/30/2015   Hyperlipidemia associated with type 2 diabetes mellitus ([place]) 03/30/2015   Gastro-esophageal reflux disease without esophagitis 03/30/2015   History of cerebrovascular accident with residual deficit 03/30/2015   Osteoarthritis 03/30/2015   Osteopenia 03/30/2015   B12 deficiency 03/30/2015    Conditions to be addressed/monitored: Depression; Mental Health Concerns   Care Plan : General Social Work (Adult)  Updates made by [name], LCSW since 07/17/2021 12:00 AM     Problem: Coping Skills (General Plan of Care)      Long-Range Goal: Coping Skills Enhanced   Start Date: 12/14/2020  This Visit's Progress: On track  Recent Progress: On track  Priority: Medium  Note:   Timeframe:  Long-Range Goal Priority:  Medium Start Date:   12/14/20                    Expected End Date: 10/18/21               Follow Up Date -09/21/21  Current Barriers:  Financial constraints Limited social support ADL IADL limitations Mental Health Concerns  Social Isolation Limited access to caregiver Inability to perform ADL's independently Inability to perform IADL's  independently Lacks knowledge of community resource: grief support resources within the area Clinical Social Work Clinical Goal(s):  Over the next 120 days, patient will work with SW to address concerns related to gaining additional support within the home and resource connection in order to maintain health and independency within the community  Over the next 120 days, patient will demonstrate improved adherence to self care as evidenced by implementing healthy self-care into her daily routine such as: attending all medical appointments, taking time for self-reflection, taking medications as prescribed, drinking water and daily exercise to improve mobility. Interventions: Patient interviewed and appropriate assessments performed CCM LCSW inquired about whether she was able to pick up the prescription for a cane. Patient reports that she is unable to obtain a ride to the office and would like for the script to be mailed to her residence on file 10/25: Patient has obtained her cane and uses it to promote safety while ambulating. Patient denies any recent falls Patient continues to endorse difficulty managing pain in shoulder. States inability to bathe properly (can't clean her back) and limited cleaning. Patient is interested in assistance with obtaining  an aid to assist. CCM LCSW discussed [facility]) and will collaborate with PCP regarding a referral  Patient receives Meals on Wheels and son will cook for her, at times Patient requests a refill on Trazodone and Flexeril. States her Bayer Asprin 81 mg wasn't included in her bubble pack on 10/22. CCM LCSW will collaborate with CCM Pharmacist Patient endorses improvement in sleep hygiene CCM LCSW inquired about patient's depression symptoms. Patient reports that her symptoms have been managed well despite the ongoing pain. CCM LCSW provided validation and support. Patient was encouraged to continue utilizing strategies discussed with  provider to assist with pain relief 10/25: Patient reports management of MDD symptoms Patient is requesting a prescription for a cane. States she continues to experience weakness in her left leg and has difficulty walking Patient reports that she has received the lift and is appreciative for the assistance Reports compliance with medications and management of depression symptoms Discussed plans with patient for ongoing care management follow up and provided patient with direct contact information for care management team Emotional Support provided due to ongoing health complications.  1:1 collaboration with PCP regarding development and update of comprehensive plan of care as evidenced by provider attestation and co-signature  Discussed plans with patient for ongoing care management follow up and provided patient with direct contact information for care management team Advised patient to contact CCM providers if she has any concerns in regards to her safety, housing or well-being. Patient reports being currently stable.  Patient Self Care Activities:  Attend all scheduled provider appointments Call provider office for new concerns or questions Utilize healthy coping skills discussed to assist with pain and depression management Continue to comply with med management        Christa See, MSW, [email or web address] Phone [phone] 1:22 AM

## 2021-07-17 NOTE — Patient Instructions (Signed)
Visit Information   Goals Addressed               This Visit's Progress     Patient Stated     "I probably could benefit from more support." (pt-stated)   On track     Patient Self Care Activities:  Attend all scheduled provider appointments Call provider office for new concerns or questions Utilize healthy coping skills discussed to assist with pain and depression management Continue to comply with med management      SW-I probably could use some extra support right now. (pt-stated)   On track     Patient Self Care Activities:  Attend all scheduled provider appointments Call provider office for new concerns or questions Utilize healthy coping skills discussed to assist with pain and depression management Continue to comply with med management        Patient verbalizes understanding of instructions provided today.  Telephone follow up appointment with care management team member scheduled for:09/21/21  Christa See, MSW, [email or web address] Phone [phone] 1:23 AM

## 2021-07-18 DIAGNOSIS — E1159 Type 2 diabetes mellitus with other circulatory complications: Secondary | ICD-10-CM

## 2021-07-18 DIAGNOSIS — E785 Hyperlipidemia, unspecified: Secondary | ICD-10-CM | POA: Diagnosis not present

## 2021-07-18 DIAGNOSIS — F3341 Major depressive disorder, recurrent, in partial remission: Secondary | ICD-10-CM | POA: Diagnosis not present

## 2021-07-18 DIAGNOSIS — E118 Type 2 diabetes mellitus with unspecified complications: Secondary | ICD-10-CM | POA: Diagnosis not present

## 2021-07-18 DIAGNOSIS — E1169 Type 2 diabetes mellitus with other specified complication: Secondary | ICD-10-CM | POA: Diagnosis not present

## 2021-07-18 DIAGNOSIS — J410 Simple chronic bronchitis: Secondary | ICD-10-CM

## 2021-07-18 DIAGNOSIS — I152 Hypertension secondary to endocrine disorders: Secondary | ICD-10-CM | POA: Diagnosis not present

## 2021-07-18 MED ORDER — CYCLOBENZAPRINE HCL 5 MG PO TABS: 5.0000 mg | ORAL_TABLET | Freq: Three times a day (TID) | ORAL | 2 refills | Status: DC | PRN

## 2021-07-18 MED ORDER — TRAZODONE HCL 50 MG PO TABS: 25.0000 mg | ORAL_TABLET | Freq: Every evening | ORAL | 0 refills | Status: DC | PRN

## 2021-07-18 MED ORDER — ASPIRIN EC 81 MG PO TBEC: 81.0000 mg | DELAYED_RELEASE_TABLET | Freq: Every day | ORAL | 1 refills | Status: DC

## 2021-07-18 NOTE — Addendum Note (Signed)
Addended by: [name] on: 07/18/2021 08:36 AM   Modules accepted: Orders

## 2021-07-20 ENCOUNTER — Ambulatory Visit (INDEPENDENT_AMBULATORY_CARE_PROVIDER_SITE_OTHER): Payer: Medicare Other | Admitting: Pharmacist

## 2021-07-20 DIAGNOSIS — I69354 Hemiplegia and hemiparesis following cerebral infarction affecting left non-dominant side: Secondary | ICD-10-CM

## 2021-07-20 DIAGNOSIS — E1169 Type 2 diabetes mellitus with other specified complication: Secondary | ICD-10-CM

## 2021-07-20 NOTE — Patient Instructions (Signed)
Thank you allowing the Care Management Team to be a part of your care! It was a pleasure speaking with you today!     CCM Team    [name] RN, MSN, CCM Nurse Care Coordinator  [phone]   Wallace Cullens, PharmD  Clinical Pharmacist  [phone]   Christa See, MSW, LCSW Clinical Social Worker [phone]   Visit Information   Goals Addressed             This Visit's Progress    Pharmacy - Patient Goals       -Please continue to keep log of home blood sugar results and have for [place] to review during our calls -Please continue to keep log of blood pressure results and have for [place] to review during our calls  Our goal bad cholesterol, or LDL, is less than 70 . This is why it is important to continue taking your atorvastatin.  I look forward to talking to you during our next telephone appointment. Please call if you need something sooner!  Wallace Cullens, PharmD, [place] [phone]         The patient verbalized understanding of instructions, educational materials, and care plan provided today and declined offer to receive copy of patient instructions, educational materials, and care plan.   Telephone follow up appointment with care management team member scheduled for: 12/2 at 9:15 am

## 2021-07-20 NOTE — Chronic Care Management (AMB) (Signed)
Chronic Care Management Pharmacy Note  07/20/2021 Name:  Kendra [name] MRN:  [id number] DOB:  [birth year]   Subjective: Kendra [name] is an 75 y.o. year old female who is a primary patient of [name], NP.  The CCM team was consulted for assistance with disease management and care coordination needs.    Engaged with patient by telephone for follow up visit in response to provider referral for pharmacy case management and/or care coordination services.   Consent to Services:  The patient was given information about Chronic Care Management services, agreed to services, and gave verbal consent prior to initiation of services.  Please see initial visit note for detailed documentation.   Patient Care Team: [name], NP as PCP - General (Internal Medicine) Steele Sizer, MD as Attending Physician (Family Medicine) Christene Lye, MD (General Surgery) Curley Spice Virl Diamond, RPH-CPP as Pharmacist Vanita Ingles, RN as Case Manager (General Practice) Malfi, [name], FNP as Nurse Practitioner (Family Medicine) [name], LCSW as Social Worker (Licensed Clinical Social Worker)   Recent consult visits: Office Visit with [name] on 10/19 for left shoulder pain Office Visit with [name] on 10/11  Objective:  Lab Results  Component Value Date   CREATININE 0.80 06/06/2021   CREATININE 0.72 05/31/2021   CREATININE 0.91 04/20/2021    Lab Results  Component Value Date   HGBA1C 6.7 (H) 05/31/2021   Last diabetic Eye exam: No results found for: HMDIABEYEEXA  Last diabetic Foot exam: No results found for: HMDIABFOOTEX      Component Value Date/Time   CHOL 92 12/21/2020 1134   CHOL 177 06/07/2015 1215   CHOL 141 09/02/2014 0440   TRIG 70 12/21/2020 1134   TRIG 155 09/02/2014 0440   HDL 51 12/21/2020 1134   HDL 56 06/07/2015 1215   HDL 37 (L) 09/02/2014 0440   CHOLHDL 1.8 12/21/2020 1134   VLDL 16  10/17/2019 0606   VLDL 31 09/02/2014 0440   LDLCALC 26 12/21/2020 1134   LDLCALC 73 09/02/2014 0440    Hepatic Function Latest Ref Rng & Units 06/06/2021 05/31/2021 04/20/2021  Total Protein 6.5 - 8.1 g/dL 7.6 7.0 7.6  Albumin 3.5 - 5.0 g/dL 4.2 - 4.1  AST 15 - 41 U/L $Remo'17 14 20  'fThiS$ ALT 0 - 44 U/L $Remo'19 15 16  'lWlmI$ Alk Phosphatase 38 - 126 U/L 83 - 98  Total Bilirubin 0.3 - 1.2 mg/dL 0.7 0.3 0.6   Clinical ASCVD: Yes  The ASCVD Risk score (Arnett DK, et al., 2019) failed to calculate for the following reasons:   The patient has a prior MI or stroke diagnosis     Social History   Tobacco Use  Smoking Status Some Days   Packs/day: 0.25   Years: 61.00   Pack years: 15.25   Types: Cigarettes  Smokeless Tobacco Former   Types: Snuff   BP Readings from Last 3 Encounters:  06/28/21 119/78  06/20/21 137/83  06/10/21 130/76   Pulse Readings from Last 3 Encounters:  06/28/21 94  06/20/21 99  06/10/21 84   Wt Readings from Last 3 Encounters:  06/28/21 129 lb 8 oz (58.7 kg)  06/20/21 131 lb (59.4 kg)  06/10/21 130 lb (59 kg)    Assessment: Review of patient past medical history, allergies, medications, health status, including review of consultants reports, laboratory and other test data, was performed as part of comprehensive evaluation and provision of chronic care management  services.   SDOH:  (Social Determinants of Health) assessments and interventions performed: yes SDOH Interventions    Flowsheet Row Most Recent Value  SDOH Interventions   SDOH Interventions for the Following Domains Tobacco  Tobacco Interventions Other (Comment)  [Encourage smoking cessation]       CCM Care Plan  Allergies  Allergen Reactions   Citalopram Nausea And Vomiting    Medications Reviewed Today     Reviewed by [name], RPH-CPP (Pharmacist) on 07/20/21 at [phone]  Med List Status: <None>   Medication Order Taking? Sig Documenting Provider Last Dose Status Informant  Acetaminophen  (TYLENOL 8 HOUR ARTHRITIS PAIN PO) [id number]  Take 2 capsules by mouth in the morning, at noon, in the evening, and at bedtime. [provider]  Active Multiple Informants  albuterol (VENTOLIN HFA) 108 (90 Base) MCG/ACT inhaler [id number] Yes INHALE 1-2 PUFFS BY MOUTH EVERY 6 HOURS AS NEEDED WHEEZING/ SHORTNESS OF BREATH [name], NP Taking Active   aspirin EC 81 MG tablet [id number] Yes Take 1 tablet (81 mg total) by mouth daily. Swallow whole. [name], NP Taking Active   atorvastatin (LIPITOR) 40 MG tablet [id number] Yes TAKE 1 TABLET BY MOUTH BEDTIME [name], NP Taking Active   Blood Glucose Monitoring Suppl [facility] VERIO) w/Device [name] [id number]  USE AS DIRECTED [name], FNP  Active Multiple Informants  buPROPion (WELLBUTRIN XL) 150 MG 24 hr tablet [id number] Yes TAKE 1 TABLET BY MOUTH ONCE DAILY [name], [name], NP Taking Active   clopidogrel (PLAVIX) 75 MG tablet [id number] Yes Take 1 tablet (75 mg total) by mouth daily. [name], MD Taking Active   cyclobenzaprine (FLEXERIL) 5 MG tablet [id number]  Take 1 tablet (5 mg total) by mouth 3 (three) times daily as needed for muscle spasms. [name], NP  Active   docusate sodium (COLACE) 100 MG capsule [id number] Yes Take 1 capsule (100 mg total) by mouth 2 (two) times daily as needed for mild constipation. [name], NP Taking Active   glucose blood [facility] VERIO) test strip [id number]  Use as instructed [name], DO  Active Multiple Informants  Lancet Devices (ONE TOUCH DELICA LANCING DEV) MISC [id number]  1 Device by Does not apply route 2 (two) times daily. [name], FNP  Active Multiple Informants  OneTouch Delica Lancets 33G MISC [id number]  1 Device by Does not apply route 2 (two) times daily. [name], DO  Active Multiple Informants  traZODone (DESYREL) 50 MG tablet [id number] Yes Take 0.5-1 tablets (25-50 mg total) by mouth at bedtime as needed for  sleep. [name], NP Taking Active   [name] 100-62.5-25 MCG/INH AEPB [id number]  USE ONE INHALATION INTO THE LUNGS ONCE A DAY RINSE MOUTH AFTER EACH USE [name], NP  Active             Patient Active Problem List   Diagnosis Date Noted   Aortic atherosclerosis (HCC) 05/31/2021   COPD (chronic obstructive pulmonary disease) (HCC) 05/31/2021   Anemia 05/31/2021   Hemiparesis affecting left side as late effect of stroke (HCC) 02/02/2021   Recurrent major depressive disorder, in partial remission (HCC) 02/02/2021   Carotid artery stenosis 12/30/2019   Hypertension associated with diabetes (HCC) 12/19/2018   Diabetes mellitus type 2, controlled, with complications (HCC) 06/06/2015   Insomnia 03/30/2015   Hyperlipidemia associated with type 2 diabetes mellitus (HCC) 03/30/2015   Gastro-esophageal reflux disease without esophagitis 03/30/2015  History of cerebrovascular accident with residual deficit 03/30/2015   Osteoarthritis 03/30/2015   Osteopenia 03/30/2015   B12 deficiency 03/30/2015    Immunization History  Administered Date(s) Administered   Fluad Quad(high Dose 65+) 05/21/2019, 05/31/2021   Influenza Split 06/20/2012   Influenza, High Dose Seasonal PF 07/26/2016, 06/14/2017, 07/31/2018   Influenza, Seasonal, Injecte, Preservative Fre 08/04/2010   Influenza,inj,Quad PF,6+ Mos 06/10/2013, 05/26/2014, 06/07/2015   Influenza-Unspecified 05/26/2014, 06/18/2018   PFIZER(Purple Top)SARS-COV-2 Vaccination 03/02/2020, 03/30/2020   Pneumococcal Conjugate-13 10/31/2007, 10/21/2014   Pneumococcal Polysaccharide-23 10/31/2007, 06/20/2012, 07/31/2018   Tdap 08/04/2010   Zoster, Live 06/10/2013    Conditions to be addressed/monitored: HLD, COPD and DMII, history of CVA  Care Plan : PharmD - Medication Mgmt  Updates made by Rennis Petty, RPH-CPP since 07/20/2021 12:00 AM     Problem: Disease Progression      Long-Range Goal: Disease Progression  Prevented or Minimized   Start Date: 11/01/2020  Expected End Date: 01/30/2021  This Visit's Progress: On track  Recent Progress: On track  Priority: High  Note:   Current Barriers:  Financial Barriers Limited social support  Pharmacist Clinical Goal(s):  Over the next 90 days, patient will maintain adherence to monitoring guidelines and medication adherence to achieve therapeutic efficacy through collaboration with PharmD and provider.   Interventions: 1:1 collaboration with Webb Silversmith, NP regarding development and update of comprehensive plan of care as evidenced by provider attestation and co-signature Inter-disciplinary care team collaboration (see longitudinal plan of care) Perform chart review.  Office Visit with [facility] on 10/19 for left shoulder pain Office Visit with [facility] on 10/11 Comprehensive medication review performed; medication list updated in electronic medical record Caution patient for risk of dizziness, sedation and falls with cyclobenzaprine and trazodone, particularly if taken in combination Counsel patient on using these medications only as needed and using the lowest dose needed  Medication Management/adherence: Reports continues to take medication from pill pack as packaged by [name] Denies missed doses Patient currently taking aspirin 81 mg daily from bottle per direction from Cardiologist, as aspirin not in pill pack from [facility] Note spoke with RPh Sam at [facility] with patient on the line last month to let pharmacy know that patient's Cardiologist Dr. Rockey Situ at [facility] has changed patient's aspirin dose from 325 mg daily to 81 mg daily per chart and requested patient's next pill pack include the aspirin 81 mg daily Call [name] today with patient on the line [place] states aspirin was not included in patient's latest pill pack as patient has a balance with the pharmacy and  patient will need to pay cost of aspirin before this will be included in next pill pack.  Patient agreeable to bringing payment to the pharmacy. Patient states will continue to take aspirin 81 mg daily from bottle until receives this next pill pack Counsel patient on importance of using maintenance inhaler (Trelegy) daily and rescue inhaler (albuterol) as needed as directed. Remind patient to rinse out mouth after each use of Trelegy  Tobacco Use: Reports has reduced smoking; currently smokes 1 cigarettes/day Encourage smoking cessation  T2DM: Current treatment: none Denies checking home blood sugar recently Encourage patient to monitor home blood sugars while on prednisone course and contact office for readings outside of established parameters   HTN: Current treatment: none Does not have recent blood pressure readings to provide Encourage patient to keep a log when she monitors and bring record with her  to medical appointments    Patient Goals/Self-Care Activities Over the next 90 days, patient will:  - take medications as prescribed  Using pill packaging from [facility] - check glucose, document, and provide at future appointments - check blood pressure, document, and provide at future appointments  Follow Up Plan: Telephone follow up appointment with care management team member scheduled for: 12/2 at 9:15 am      Patient's preferred pharmacy is:  [facility], [address] [place] [place] [address] Phone: [phone] Fax: [phone]  [id number] - Closed - [place], [place] [address] [place] [address] Phone: [phone] Fax: [phone]  [facility] [place], [place] [address] Phone: [phone] Fax: [phone]   Follow Up:  Patient agrees to Care Plan and Follow-up.  Wallace Cullens, PharmD, [place], CPP Clinical  Pharmacist [facility] [phone]

## 2021-08-01 ENCOUNTER — Ambulatory Visit (INDEPENDENT_AMBULATORY_CARE_PROVIDER_SITE_OTHER): Payer: Medicare Other | Admitting: Internal Medicine

## 2021-08-01 ENCOUNTER — Encounter: Payer: Self-pay | Admitting: Internal Medicine

## 2021-08-01 ENCOUNTER — Other Ambulatory Visit: Payer: Self-pay

## 2021-08-01 VITALS — BP 109/65 | HR 61 | Temp 97.1°F | Resp 17 | Ht 64.0 in | Wt 130.2 lb

## 2021-08-01 DIAGNOSIS — I69354 Hemiplegia and hemiparesis following cerebral infarction affecting left non-dominant side: Secondary | ICD-10-CM | POA: Diagnosis not present

## 2021-08-01 DIAGNOSIS — E1169 Type 2 diabetes mellitus with other specified complication: Secondary | ICD-10-CM

## 2021-08-01 DIAGNOSIS — E1159 Type 2 diabetes mellitus with other circulatory complications: Secondary | ICD-10-CM | POA: Diagnosis not present

## 2021-08-01 DIAGNOSIS — I152 Hypertension secondary to endocrine disorders: Secondary | ICD-10-CM | POA: Diagnosis not present

## 2021-08-01 DIAGNOSIS — I700 Atherosclerosis of aorta: Secondary | ICD-10-CM | POA: Diagnosis not present

## 2021-08-01 DIAGNOSIS — E118 Type 2 diabetes mellitus with unspecified complications: Secondary | ICD-10-CM

## 2021-08-01 DIAGNOSIS — E785 Hyperlipidemia, unspecified: Secondary | ICD-10-CM | POA: Diagnosis not present

## 2021-08-01 DIAGNOSIS — I6523 Occlusion and stenosis of bilateral carotid arteries: Secondary | ICD-10-CM

## 2021-08-01 MED ORDER — NAPROXEN 375 MG PO TABS: 375.0000 mg | ORAL_TABLET | Freq: Two times a day (BID) | ORAL | 0 refills | Status: DC

## 2021-08-01 NOTE — Assessment & Plan Note (Signed)
Controlled off meds  Will monitor 

## 2021-08-01 NOTE — Assessment & Plan Note (Signed)
CMET and lipid profile today Encouraged low fat diet Continue Atorvastatin, ASA and Plavix

## 2021-08-01 NOTE — Assessment & Plan Note (Signed)
Persistent left sided weakness, not disabling

## 2021-08-01 NOTE — Assessment & Plan Note (Signed)
A1C today Encouraged her to consume a low carb diet Encouraged routine foot exams Encouraged routine eye exams Flu, pneumovax, prevnar and covid vaccines UTD

## 2021-08-01 NOTE — Assessment & Plan Note (Signed)
Asymptomatic Encouraged low fat diet Continue Atorvastatin, ASA and Plavix

## 2021-08-01 NOTE — Progress Notes (Signed)
Subjective:    Patient ID: Kendra [name], female    DOB: 1946-03-04, 75 y.o.   MRN: [id number]  HPI  Pt presents to the clinic today for 3 month follow up of HTN, HLD and DM 2.  HTN: Her BP today is 109/65. She is not taking any antihypertensive medications at this time. ECG from 04/2021 reviewed.  HLD with Aortic Atherosclerosis/CAD s/p CVA: She has residual left sided weakness. Her last LDL was 26, triglycerides 70, 12/2020. She denies myalgias on Atorvastatin. She is taking ASA and Plavix as well. She does not consume a low fat diet.  DM 2: Her last A1C was 6.7%. She is not taking any oral diabetic medication at this time. She does not check her sugars. She checks her feet routinely. Her last eye exam was in 2021. Flu: 05/2021. Pneumovax 07/2018. Prevnar: 11/2014. Covid: Pfizer x 2  She also reports right side neck pain. She reports this started 2 months ago. She describes the pain as sore and achy. The pain radiates into her right shoulder and elbow. She denies numbness, tingling or weakness. She reports she thinks the pain is from where she got her flu shot. She has seen [employer], was told she had arthritis in her shoulder. She got a steroid injection last month which she reports helped some. She has been taking Tylenol OTC with minimal relief of symptoms.   Review of Systems     Past Medical History:  Diagnosis Date   Asthma    Back pain    Cataract    Cervical disc disorder    COPD (chronic obstructive pulmonary disease) (HCC)    Depression    Diabetes (HCC)    type II   Diabetic neuropathy (HCC)    Dyspnea    with exertion   GERD (gastroesophageal reflux disease)    History of kidney stones    per patient "can't remember the year"   Hyperlipidemia    Hypertension    per patient "take meds for it"   Insomnia    Neuropathy    Osteoarthritis    Osteopenia    Pneumonia 2018   per patient   Reflux    Rheumatoid arthritis ([place])    Stroke ([place]) 2015   and  again 2017  - Weakness in left leg, staggers w/ walking, vision    Tenosynovitis     Current Outpatient Medications  Medication Sig Dispense Refill   Acetaminophen (TYLENOL 8 HOUR ARTHRITIS PAIN PO) Take 2 capsules by mouth in the morning, at noon, in the evening, and at bedtime.     albuterol (VENTOLIN HFA) 108 (90 Base) MCG/ACT inhaler INHALE 1-2 PUFFS BY MOUTH EVERY 6 HOURS AS NEEDED WHEEZING/ SHORTNESS OF BREATH 8.5 g 1   aspirin EC 81 MG tablet Take 1 tablet (81 mg total) by mouth daily. Swallow whole. 90 tablet 1   atorvastatin (LIPITOR) 40 MG tablet TAKE 1 TABLET BY MOUTH BEDTIME 90 tablet 0   Blood Glucose Monitoring Suppl (ONETOUCH VERIO) w/Device KIT USE AS DIRECTED 1 kit 0   buPROPion (WELLBUTRIN XL) 150 MG 24 hr tablet TAKE 1 TABLET BY MOUTH ONCE DAILY 90 tablet 0   clopidogrel (PLAVIX) 75 MG tablet Take 1 tablet (75 mg total) by mouth daily.     cyclobenzaprine (FLEXERIL) 5 MG tablet Take 1 tablet (5 mg total) by mouth 3 (three) times daily as needed for muscle spasms. 30 tablet 2   docusate sodium (COLACE) 100 MG capsule Take  1 capsule (100 mg total) by mouth 2 (two) times daily as needed for mild constipation. 60 capsule 2   glucose blood (ONETOUCH VERIO) test strip Use as instructed 200 each 4   Lancet Devices (ONE TOUCH DELICA LANCING DEV) MISC 1 Device by Does not apply route 2 (two) times daily. 200 each 4   OneTouch Delica Lancets 36O MISC 1 Device by Does not apply route 2 (two) times daily. 200 each 4   traZODone (DESYREL) 50 MG tablet Take 0.5-1 tablets (25-50 mg total) by mouth at bedtime as needed for sleep. 90 tablet 0   TRELEGY ELLIPTA 100-62.5-25 MCG/INH AEPB USE ONE INHALATION INTO THE LUNGS ONCE A DAY RINSE MOUTH AFTER EACH USE 60 each 1   No current facility-administered medications for this visit.    Allergies  Allergen Reactions   Citalopram Nausea And Vomiting    Family History  Problem Relation Age of Onset   Stroke Father    Depression Father     Diabetes Mother    Hyperlipidemia Mother    Breast cancer Mother    Heart murmur Son    Heart murmur Son    Heart murmur Son     Social History   Socioeconomic History   Marital status: Widowed    Spouse name: Not on file   Number of children: Not on file   Years of education: Not on file   Highest education level: Not on file  Occupational History   Not on file  Tobacco Use   Smoking status: Some Days    Packs/day: 0.25    Years: 61.00    Pack years: 15.25    Types: Cigarettes   Smokeless tobacco: Former    Types: Snuff  Vaping Use   Vaping Use: Never used  Substance and Sexual Activity   Alcohol use: No   Drug use: No   Sexual activity: Never  Other Topics Concern   Not on file  Social History Narrative   Not on file   Social Determinants of Health   Financial Resource Strain: Low Risk    Difficulty of Paying Living Expenses: Not very hard  Food Insecurity: No Food Insecurity   Worried About Charity fundraiser in the Last Year: Never true   [place] in the Last Year: Never true  Transportation Needs: No Transportation Needs   Lack of Transportation (Medical): No   Lack of Transportation (Non-Medical): No  Physical Activity: Inactive   Days of Exercise per Week: 0 days   Minutes of Exercise per Session: 0 min  Stress: No Stress Concern Present   Feeling of Stress : Only a little  Social Connections: Socially Isolated   Frequency of Communication with Friends and Family: More than three times a week   Frequency of Social Gatherings with Friends and Family: More than three times a week   Attends Religious Services: Never   Marine scientist or Organizations: No   Attends Archivist Meetings: Never   Marital Status: Widowed  Human resources officer Violence: Not At Risk   Fear of Current or Ex-Partner: No   Emotionally Abused: No   Physically Abused: No   Sexually Abused: No     Constitutional: Denies fever, malaise, fatigue, headache  or abrupt weight changes.  Respiratory: Denies difficulty breathing, shortness of breath, cough or sputum production.   Cardiovascular: Denies chest pain, chest tightness, palpitations or swelling in the hands or feet.  Musculoskeletal: Pt reports left  sided weakness, right side neck pain, shoulder pain. Denies decrease in range of motion, difficulty with gait, muscle pain or joint swelling.  Skin: Denies redness, rashes, lesions or ulcercations.  Neurological: Pt reports insomnia. Denies dizziness, difficulty with memory, difficulty with speech or problems with balance and coordination.  Psych: Pt has a history of depression. Denies anxiety, SI/HI.  No other specific complaints in a complete review of systems (except as listed in HPI above).  Objective:   Physical Exam  BP 109/65 (BP Location: Left Arm, Patient Position: Sitting, Cuff Size: Normal)   Pulse 61   Temp (!) 97.1 F (36.2 C) (Temporal)   Resp 17   Ht 5\' 4"  (1.626 m)   Wt 130 lb 3.2 oz (59.1 kg)   SpO2 100%   BMI 22.35 kg/m   Wt Readings from Last 3 Encounters:  06/28/21 129 lb 8 oz (58.7 kg)  06/20/21 131 lb (59.4 kg)  06/10/21 130 lb (59 kg)    General: Appears her stated age, well developed, well nourished in NAD. Skin: Warm, dry and intact. No ulcerations noted. HEENT: Head: normal shape and size; Eyes: sclera white and EOMs intact;  Cardiovascular: Normal rate and rhythm. S1,S2 noted.  No murmur, rubs or gallops noted. No JVD or BLE edema. No carotid bruits noted. Pulmonary/Chest: Normal effort and positive vesicular breath sounds. No respiratory distress. No wheezes, rales or ronchi noted.  Musculoskeletal: Normal flexion of the cervical spine. Decreased extension and rotation of the cervical spine. No bony tenderness noted over the cervical spine. Pain with palpation of the right paracervical muscles. She has normal internal and external rotation of the right shoulder, but painful. Pain with palpation of the  right subacromial bursa. Strength 4/5 LUE, 5/5 RUE. Hand grips unequal R>L. Neurological: Alert and oriented.    BMET    Component Value Date/Time   NA 139 06/06/2021 1314   NA 141 01/18/2016 1150   NA 138 09/01/2014 2130   K 4.1 06/06/2021 1314   K 3.5 09/01/2014 2130   CL 103 06/06/2021 1314   CL 104 09/01/2014 2130   CO2 30 06/06/2021 1314   CO2 28 09/01/2014 2130   GLUCOSE 126 (H) 06/06/2021 1314   GLUCOSE 287 (H) 09/01/2014 2130   BUN 24 (H) 06/06/2021 1314   BUN 12 01/18/2016 1150   BUN 14 09/01/2014 2130   CREATININE 0.80 06/06/2021 1314   CREATININE 0.72 05/31/2021 1406   CALCIUM 9.6 06/06/2021 1314   CALCIUM 8.9 09/01/2014 2130   GFRNONAA >60 06/06/2021 1314   GFRNONAA 70 06/14/2020 0929   GFRAA 82 06/14/2020 0929    Lipid Panel     Component Value Date/Time   CHOL 92 12/21/2020 1134   CHOL 177 06/07/2015 1215   CHOL 141 09/02/2014 0440   TRIG 70 12/21/2020 1134   TRIG 155 09/02/2014 0440   HDL 51 12/21/2020 1134   HDL 56 06/07/2015 1215   HDL 37 (L) 09/02/2014 0440   CHOLHDL 1.8 12/21/2020 1134   VLDL 16 10/17/2019 0606   VLDL 31 09/02/2014 0440   LDLCALC 26 12/21/2020 1134   LDLCALC 73 09/02/2014 0440    CBC    Component Value Date/Time   WBC 4.9 06/06/2021 1314   RBC 3.66 (L) 06/06/2021 1314   HGB 11.6 (L) 06/06/2021 1314   HGB 12.7 09/01/2014 2130   HCT 35.7 (L) 06/06/2021 1314   HCT 37.9 09/01/2014 2130   PLT 228 06/06/2021 1314   PLT 260 09/05/2014 0517  MCV 97.5 06/06/2021 1314   MCV 92 09/01/2014 2130   MCH 31.7 06/06/2021 1314   MCHC 32.5 06/06/2021 1314   RDW 12.8 06/06/2021 1314   RDW 13.9 09/01/2014 2130   LYMPHSABS 1.5 06/06/2021 1314   MONOABS 0.3 06/06/2021 1314   EOSABS 0.1 06/06/2021 1314   BASOSABS 0.0 06/06/2021 1314    Hgb A1C Lab Results  Component Value Date   HGBA1C 6.7 (H) 05/31/2021            Assessment & Plan:   Chronic Neck and Right Shoulder Pain:  Likely arthritis and bursitis RX for  Naproxen 375 mg BID- kidney function reviewed She will continue to follow with orthopedics  RTC in 3 months for your annual exam  Webb Silversmith, NP This visit occurred during the SARS-CoV-2 public health emergency.  Safety protocols were in place, including screening questions prior to the visit, additional usage of staff PPE, and extensive cleaning of exam room while observing appropriate contact time as indicated for disinfecting solutions.

## 2021-08-01 NOTE — Patient Instructions (Signed)
Heart-Healthy Eating Plan Heart-healthy meal planning includes: Eating less unhealthy fats. Eating more healthy fats. Making other changes in your diet. Talk with your doctor or a diet specialist (dietitian) to create an eating plan that is right for you. What is my plan? Your doctor may recommend an eating plan that includes: Total fat: ______% or less of total calories a day. Saturated fat: ______% or less of total calories a day. Cholesterol: less than _________mg a day. What are tips for following this plan? Cooking Avoid frying your food. Try to bake, boil, grill, or broil it instead. You can also reduce fat by: Removing the skin from poultry. Removing all visible fats from meats. Steaming vegetables in water or broth. Meal planning  At meals, divide your plate into four equal parts: Fill one-half of your plate with vegetables and green salads. Fill one-fourth of your plate with whole grains. Fill one-fourth of your plate with lean protein foods. Eat 4-5 servings of vegetables per day. A serving of vegetables is: 1 cup of raw or cooked vegetables. 2 cups of raw leafy greens. Eat 4-5 servings of fruit per day. A serving of fruit is: 1 medium whole fruit.  cup of dried fruit.  cup of fresh, frozen, or canned fruit.  cup of 100% fruit juice. Eat more foods that have soluble fiber. These are apples, broccoli, carrots, beans, peas, and barley. Try to get 20-30 g of fiber per day. Eat 4-5 servings of nuts, legumes, and seeds per week: 1 serving of dried beans or legumes equals  cup after being cooked. 1 serving of nuts is  cup. 1 serving of seeds equals 1 tablespoon. General information Eat more home-cooked food. Eat less restaurant, buffet, and fast food. Limit or avoid alcohol. Limit foods that are high in starch and sugar. Avoid fried foods. Lose weight if you are overweight. Keep track of how much salt (sodium) you eat. This is important if you have high blood  pressure. Ask your doctor to tell you more about this. Try to add vegetarian meals each week. Fats Choose healthy fats. These include olive oil and canola oil, flaxseeds, walnuts, almonds, and seeds. Eat more omega-3 fats. These include salmon, mackerel, sardines, tuna, flaxseed oil, and ground flaxseeds. Try to eat fish at least 2 times each week. Check food labels. Avoid foods with trans fats or high amounts of saturated fat. Limit saturated fats. These are often found in animal products, such as meats, butter, and cream. These are also found in plant foods, such as palm oil, palm kernel oil, and coconut oil. Avoid foods with partially hydrogenated oils in them. These have trans fats. Examples are stick margarine, some tub margarines, cookies, crackers, and other baked goods. What foods can I eat? Fruits All fresh, canned (in natural juice), or frozen fruits. Vegetables Fresh or frozen vegetables (raw, steamed, roasted, or grilled). Green salads. Grains Most grains. Choose whole wheat and whole grains most of the time. Rice and pasta, including brown rice and pastas made with whole wheat. Meats and other proteins Lean, well-trimmed beef, veal, pork, and lamb. Chicken and turkey without skin. All fish and shellfish. Wild duck, rabbit, pheasant, and venison. Egg whites or low-cholesterol egg substitutes. Dried beans, peas, lentils, and tofu. Seeds and most nuts. Dairy Low-fat or nonfat cheeses, including ricotta and mozzarella. Skim or 1% milk that is liquid, powdered, or evaporated. Buttermilk that is made with low-fat milk. Nonfat or low-fat yogurt. Fats and oils Non-hydrogenated (trans-free) margarines. Vegetable oils, including   soybean, sesame, sunflower, olive, peanut, safflower, corn, canola, and cottonseed. Salad dressings or mayonnaise made with a vegetable oil. Beverages Mineral water. Coffee and tea. Diet carbonated beverages. Sweets and desserts Sherbet, gelatin, and fruit ice.  Small amounts of dark chocolate. Limit all sweets and desserts. Seasonings and condiments All seasonings and condiments. The items listed above may not be a complete list of foods and drinks you can eat. Contact a dietitian for more options. What foods should I avoid? Fruits Canned fruit in heavy syrup. Fruit in cream or butter sauce. Fried fruit. Limit coconut. Vegetables Vegetables cooked in cheese, cream, or butter sauce. Fried vegetables. Grains Breads that are made with saturated or trans fats, oils, or whole milk. Croissants. Sweet rolls. Donuts. High-fat crackers, such as cheese crackers. Meats and other proteins Fatty meats, such as hot dogs, ribs, sausage, bacon, rib-eye roast or steak. High-fat deli meats, such as salami and bologna. Caviar. Domestic duck and goose. Organ meats, such as liver. Dairy Cream, sour cream, cream cheese, and creamed cottage cheese. Whole-milk cheeses. Whole or 2% milk that is liquid, evaporated, or condensed. Whole buttermilk. Cream sauce or high-fat cheese sauce. Yogurt that is made from whole milk. Fats and oils Meat fat, or shortening. Cocoa butter, hydrogenated oils, palm oil, coconut oil, palm kernel oil. Solid fats and shortenings, including bacon fat, salt pork, lard, and butter. Nondairy cream substitutes. Salad dressings with cheese or sour cream. Beverages Regular sodas and juice drinks with added sugar. Sweets and desserts Frosting. Pudding. Cookies. Cakes. Pies. Milk chocolate or white chocolate. Buttered syrups. Full-fat ice cream or ice cream drinks. The items listed above may not be a complete list of foods and drinks to avoid. Contact a dietitian for more information. Summary Heart-healthy meal planning includes eating less unhealthy fats, eating more healthy fats, and making other changes in your diet. Eat a balanced diet. This includes fruits and vegetables, low-fat or nonfat dairy, lean protein, nuts and legumes, whole grains, and  heart-healthy oils and fats. This information is not intended to replace advice given to you by your health care provider. Make sure you discuss any questions you have with your health care provider. Document Revised: 01/13/2021 Document Reviewed: 01/13/2021 Elsevier Patient Education  2022 Elsevier Inc.  

## 2021-08-02 LAB — COMPLETE METABOLIC PANEL WITH GFR
AG Ratio: 1.7 (calc) (ref 1.0–2.5)
ALT: 17 U/L (ref 6–29)
AST: 14 U/L (ref 10–35)
Albumin: 4.3 g/dL (ref 3.6–5.1)
Alkaline phosphatase (APISO): 87 U/L (ref 37–153)
BUN: 21 mg/dL (ref 7–25)
CO2: 28 mmol/L (ref 20–32)
Calcium: 9.8 mg/dL (ref 8.6–10.4)
Chloride: 103 mmol/L (ref 98–110)
Creat: 0.85 mg/dL (ref 0.60–1.00)
Globulin: 2.6 g/dL (calc) (ref 1.9–3.7)
Glucose, Bld: 135 mg/dL — ABNORMAL HIGH (ref 65–99)
Potassium: 4.5 mmol/L (ref 3.5–5.3)
Sodium: 139 mmol/L (ref 135–146)
Total Bilirubin: 0.3 mg/dL (ref 0.2–1.2)
Total Protein: 6.9 g/dL (ref 6.1–8.1)
eGFR: 72 mL/min/{1.73_m2} (ref >=60)

## 2021-08-02 LAB — HEMOGLOBIN A1C
Hgb A1c MFr Bld: 7.7 % of total Hgb — ABNORMAL HIGH (ref <5.7)
Mean Plasma Glucose: 174 mg/dL
eAG (mmol/L): 9.7 mmol/L

## 2021-08-02 LAB — LIPID PANEL
Cholesterol: 135 mg/dL (ref <200)
HDL: 65 mg/dL (ref >=50)
LDL Cholesterol (Calc): 53 mg/dL (calc)
Non-HDL Cholesterol (Calc): 70 mg/dL (calc) (ref <130)
Total CHOL/HDL Ratio: 2.1 (calc) (ref <5.0)
Triglycerides: 89 mg/dL (ref <150)

## 2021-08-05 MED ORDER — METFORMIN HCL 500 MG PO TABS: 500.0000 mg | ORAL_TABLET | Freq: Two times a day (BID) | ORAL | 2 refills | Status: DC

## 2021-08-05 NOTE — Addendum Note (Signed)
Addended by: [name] on: 08/05/2021 12:15 PM   Modules accepted: Orders

## 2021-08-08 ENCOUNTER — Ambulatory Visit: Payer: Medicare Other | Admitting: General Practice

## 2021-08-08 ENCOUNTER — Ambulatory Visit: Payer: Self-pay

## 2021-08-08 ENCOUNTER — Other Ambulatory Visit: Payer: Self-pay | Admitting: Internal Medicine

## 2021-08-08 ENCOUNTER — Ambulatory Visit: Payer: Medicare Other | Admitting: Internal Medicine

## 2021-08-08 ENCOUNTER — Other Ambulatory Visit: Payer: Self-pay

## 2021-08-08 DIAGNOSIS — G8929 Other chronic pain: Secondary | ICD-10-CM

## 2021-08-08 DIAGNOSIS — E1159 Type 2 diabetes mellitus with other circulatory complications: Secondary | ICD-10-CM

## 2021-08-08 DIAGNOSIS — F3341 Major depressive disorder, recurrent, in partial remission: Secondary | ICD-10-CM

## 2021-08-08 DIAGNOSIS — E1169 Type 2 diabetes mellitus with other specified complication: Secondary | ICD-10-CM

## 2021-08-08 DIAGNOSIS — E118 Type 2 diabetes mellitus with unspecified complications: Secondary | ICD-10-CM

## 2021-08-08 DIAGNOSIS — E785 Hyperlipidemia, unspecified: Secondary | ICD-10-CM

## 2021-08-08 DIAGNOSIS — J410 Simple chronic bronchitis: Secondary | ICD-10-CM

## 2021-08-08 MED ORDER — BLOOD GLUCOSE MONITOR KIT: PACK | 0 refills | Status: DC

## 2021-08-08 NOTE — Patient Instructions (Signed)
Visit Information  Thank you for taking time to visit with me today. Please don't hesitate to contact me if I can be of assistance to you before our next scheduled telephone appointment.  Following are the goals we discussed today:  (Copy and paste patient goals from clinical care plan here)  Our next appointment is by telephone on 10-10-2021 at 1030 am  Please call the care guide team at [phone] if you need to cancel or reschedule your appointment.   Please call 911 call the Suicide and Crisis Lifeline: 988 call the [place] National Suicide Prevention Lifeline: 215-349-2953 call 1-800-273-TALK (toll free, 24 hour hotline) if you are experiencing a Mental Health or [place] or need someone to talk to.  The patient verbalized understanding of instructions, educational materials, and care plan provided today and declined offer to receive copy of patient instructions, educational materials, and care plan.   [name] RN, MSN, [place] Mobile: [phone]

## 2021-08-08 NOTE — Progress Notes (Signed)
RX  for meter sent to pharmacy

## 2021-08-08 NOTE — Telephone Encounter (Signed)
Requested medications are due for refill today.  yes  Requested medications are on the active medications list.  yes  Last refill. 06/10/2021  Future visit scheduled.   yes  Notes to clinic.  Rx filled 06/10/2021 and signed by Dr. Rockey Situ

## 2021-08-08 NOTE — Chronic Care Management (AMB) (Signed)
Chronic Care Management   CCM RN Visit Note  08/08/2021 Name: Kendra [name] MRN: [id number] DOB: 1946-07-17  Subjective: Kendra [name] is a 75 y.o. year old female who is a primary care patient of [name], NP. The care management team was consulted for assistance with disease management and care coordination needs.    Engaged with patient by telephone for follow up visit in response to provider referral for case management and/or care coordination services.   Consent to Services:  The patient was given information about Chronic Care Management services, agreed to services, and gave verbal consent prior to initiation of services.  Please see initial visit note for detailed documentation.   Patient agreed to services and verbal consent obtained.   Assessment: Review of patient past medical history, allergies, medications, health status, including review of consultants reports, laboratory and other test data, was performed as part of comprehensive evaluation and provision of chronic care management services.   SDOH (Social Determinants of Health) assessments and interventions performed:    CCM Care Plan  Allergies  Allergen Reactions   Citalopram Nausea And Vomiting    Outpatient Encounter Medications as of 08/08/2021  Medication Sig   Acetaminophen (TYLENOL 8 HOUR ARTHRITIS PAIN PO) Take 2 capsules by mouth in the morning, at noon, in the evening, and at bedtime.   albuterol (VENTOLIN HFA) 108 (90 Base) MCG/ACT inhaler INHALE 1-2 PUFFS BY MOUTH EVERY 6 HOURS AS NEEDED WHEEZING/ SHORTNESS OF BREATH   aspirin EC 81 MG tablet Take 1 tablet (81 mg total) by mouth daily. Swallow whole.   atorvastatin (LIPITOR) 40 MG tablet TAKE 1 TABLET BY MOUTH BEDTIME   Blood Glucose Monitoring Suppl (ONETOUCH VERIO) w/Device KIT USE AS DIRECTED   buPROPion (WELLBUTRIN XL) 150 MG 24 hr tablet TAKE 1 TABLET BY MOUTH ONCE DAILY   buPROPion (WELLBUTRIN XL) 150 MG 24 hr tablet Take 1 tablet  by mouth daily.   clopidogrel (PLAVIX) 75 MG tablet Take 1 tablet (75 mg total) by mouth daily.   cyclobenzaprine (FLEXERIL) 5 MG tablet Take 1 tablet (5 mg total) by mouth 3 (three) times daily as needed for muscle spasms.   docusate sodium (COLACE) 100 MG capsule Take 1 capsule (100 mg total) by mouth 2 (two) times daily as needed for mild constipation.   glucose blood (ONETOUCH VERIO) test strip Use as instructed   Lancet Devices (ONE TOUCH DELICA LANCING DEV) MISC 1 Device by Does not apply route 2 (two) times daily.   metFORMIN (GLUCOPHAGE) 500 MG tablet Take 1 tablet (500 mg total) by mouth 2 (two) times daily with a meal.   naproxen (NAPROSYN) 375 MG tablet Take 1 tablet (375 mg total) by mouth 2 (two) times daily with a meal.   OneTouch Delica Lancets 44R MISC 1 Device by Does not apply route 2 (two) times daily.   traZODone (DESYREL) 50 MG tablet Take 0.5-1 tablets (25-50 mg total) by mouth at bedtime as needed for sleep.   TRELEGY ELLIPTA 100-62.5-25 MCG/INH AEPB USE ONE INHALATION INTO THE LUNGS ONCE A DAY RINSE MOUTH AFTER EACH USE   No facility-administered encounter medications on file as of 08/08/2021.    Patient Active Problem List   Diagnosis Date Noted   Aortic atherosclerosis ([place]) 05/31/2021   COPD (chronic obstructive pulmonary disease) ([place]) 05/31/2021   Anemia 05/31/2021   Hemiparesis affecting left side as late effect of stroke ([place]) 02/02/2021   Recurrent major depressive disorder, in partial remission ([place]) 02/02/2021  Carotid artery stenosis 12/30/2019   Hypertension associated with diabetes ([place]) 12/19/2018   Diabetes mellitus type 2, controlled, with complications ([place]) 28/41/3244   Insomnia 03/30/2015   Hyperlipidemia associated with type 2 diabetes mellitus ([place]) 03/30/2015   Gastro-esophageal reflux disease without esophagitis 03/30/2015   Osteoarthritis 03/30/2015   Osteopenia 03/30/2015   B12 deficiency 03/30/2015    Conditions to be  addressed/monitored:HTN, HLD, COPD, DMII, Depression, and Osteoarthritis  Care Plan : RNCM: Hypertension (Adult)  Updates made by Vanita Ingles, RN since 08/08/2021 12:00 AM  Completed 08/08/2021   Problem: RNCM: Hypertension (Hypertension) Resolved 08/08/2021  Priority: Medium     Long-Range Goal: RNCM: Hypertension Monitored Completed 08/08/2021  Start Date: 01/24/2021  Expected End Date: 02/28/2022  Recent Progress: On track  Priority: Medium  Note:   Objective: Resolving, duplicate goal  Last practice recorded BP readings:  BP Readings from Last 3 Encounters:  05/31/21 139/76  04/21/21 128/68  02/22/21 129/73   Most recent eGFR/CrCl: No results found for: EGFR  No components found for: CRCL Current Barriers:  Knowledge Deficits related to basic understanding of hypertension pathophysiology and self care management Knowledge Deficits related to understanding of medications prescribed for management of hypertension Transportation barriers Cognitive Deficits Limited Social Designer, multimedia.  Unable to independently HTN Lacks social connections Unable to perform IADLs independently Does not contact provider office for questions/concerns Case Manager Clinical Goal(s):  patient will verbalize understanding of plan for hypertension management patient will attend all scheduled medical appointments: 08-08-2021 at 10 am patient will demonstrate improved adherence to prescribed treatment plan for hypertension as evidenced by taking all medications as prescribed, monitoring and recording blood pressure as directed, adhering to low sodium/DASH diet patient will demonstrate improved health management independence as evidenced by checking blood pressure as directed and notifying PCP if SBP>160 or DBP > 90, taking all medications as prescribe, and adhering to a low sodium diet as discussed. patient will verbalize basic understanding of hypertension disease process and self  health management plan as evidenced by compliance with medications, compliance with heart healthy/ADA diet and working with the CCM team to optimize health and well being  Interventions:  Collaboration with [name], NP regarding development and update of comprehensive plan of care as evidenced by provider attestation and co-signature Inter-disciplinary care team collaboration (see longitudinal plan of care) UNABLE to independently:manage HTN Evaluation of current treatment plan related to hypertension self management and patient's adherence to plan as established by provider. 04-14-2021: The patient just woke up at the time of the call. The patient states that her pressures are doing good but she did not have her log near her and was still in the bed. Denies any issues with taking medications and is eating well. The patient states she has actually gained weight. Encouraged the patient to continue checking blood pressures on a consistent bases. 06-09-2021: The patient states the last time she took her blood pressure was on 06-05-2021 and it was 132/89 with pulse of 90.  The patient states that she has been stressed out. Was in the ER on 04-20-2021 and she will see the cardiologist tomorrow. She feels the biggest issue causing her CP is stress from being concerned over her sons. Allowed the patient to express her feels. Endorses taking her medications as directed. Ask the patient to check her blood pressures more frequently and record. The patient verbalized understanding.  Provided education to patient re: stroke prevention, s/s of heart attack and stroke, DASH diet, complications  of uncontrolled blood pressure Reviewed medications with patient and discussed importance of compliance. 06-09-2021: The patient states compliance with her medications.  Discussed plans with patient for ongoing care management follow up and provided patient with direct contact information for care management team Advised patient,  providing education and rationale, to monitor blood pressure daily and record, calling PCP for findings outside established parameters.  Reviewed scheduled/upcoming provider appointments including: 08-08-2021 at 10 am Self-Care Activities: - Self administers medications as prescribed Attends all scheduled provider appointments Calls provider office for new concerns, questions, or BP outside discussed parameters Checks BP and records as discussed Follows a low sodium diet/DASH diet Patient Goals: - check blood pressure daily - choose a place to take my blood pressure (home, clinic or office, retail store) - write blood pressure results in a log or diary - agree on reward when goals are met - agree to work together to make changes - ask questions to understand - have a family meeting to talk about healthy habits - learn about high blood pressure  Follow Up Plan: Face to Face appointment with care management team member scheduled for:  08-08-2021 at 0945  am    Care Plan : RNCM: HLD Management  Updates made by Vanita Ingles, RN since 08/08/2021 12:00 AM  Completed 08/08/2021   Problem: RNCM: Health Promotion or Disease Self-Management (General Plan of Care) Resolved 08/08/2021  Priority: Medium     Long-Range Goal: RNCM: HLD Self-Management Plan Developed Completed 08/08/2021  Start Date: 01/24/2021  Expected End Date: 02/28/2022  Recent Progress: On track  Priority: Medium  Note:   Current Barriers: Resolving, duplicate goal  Poorly controlled hyperlipidemia, complicated by HTN, smoker, COPd Current antihyperlipidemic regimen: Lipitor 2m daily Most recent lipid panel:     Component Value Date/Time   CHOL 92 12/21/2020 1134   CHOL 177 06/07/2015 1215   CHOL 141 09/02/2014 0440   TRIG 70 12/21/2020 1134   TRIG 155 09/02/2014 0440   HDL 51 12/21/2020 1134   HDL 56 06/07/2015 1215   HDL 37 (L) 09/02/2014 0440   CHOLHDL 1.8 12/21/2020 1134   VLDL 16 10/17/2019 0606   VLDL  31 09/02/2014 0440   LDLCALC 26 12/21/2020 1134   LDLCALC 73 09/02/2014 0440   ASCVD risk enhancing conditions: age >>32 DM, HTN, current smoker Unable to independently HLD Lacks social connections Unable to perform IADLs independently Does not contact provider office for questions/concerns RN Care Manager Clinical Goal(s):  patient will work with RConsulting civil engineer providers, and care team towards execution of optimized self-health management plan patient will verbalize understanding of plan for HLD patient [facility], and pcp to address needs related to HLD patient will take all medications exactly as prescribed and will call provider for medication related questions patient will attend all scheduled medical appointments: 08-08-2021 at 10 am patient will demonstrate improved adherence to prescribed treatment plan for HLD patient will demonstrate improved health management independence patient will demonstrate understanding of rationale for each prescribed medication the patient will demonstrate ongoing self health care management ability Interventions: Collaboration with BJearld Fenton NP regarding development and update of comprehensive plan of care as evidenced by provider attestation and co-signature Inter-disciplinary care team collaboration (see longitudinal plan of care) Medication review performed; medication list updated in electronic medical record.  Inter-disciplinary care team collaboration (see longitudinal plan of care) Referred to pharmacy team for assistance with HLD medication management Evaluation of current treatment plan related to  HLD  and patient's adherence to plan as established by provider. 06-09-2021: The patient denies any new concerns with HLD health. States she is eating good and monitoring her salt and fats.  Advised patient to call for changes in conditions, questions, or new concerns Reviewed medications with patient and discussed  compliance. The patient states compliance with medications  Reviewed scheduled/upcoming provider appointments including: 08-08-2021 at 10 am Discussed plans with patient for ongoing care management follow up and provided patient with direct contact information for care management team Patient Goals/Self-Care Activities: - call for medicine refill 2 or 3 days before it runs out - call if I am sick and can't take my medicine - keep a list of all the medicines I take; vitamins and herbals too - learn to read medicine labels - use a pillbox to sort medicine - use an alarm clock or phone to remind me to take my medicine - change to whole grain breads, cereal, pasta - drink 6 to 8 glasses of water each day - eat 3 to 5 servings of fruits and vegetables each day - eat 5 or 6 small meals each day - eat fish at least once per week - fill half the plate with nonstarchy vegetables - join a weight loss program - limit fast food meals to no more than 1 per week - manage portion size - prepare main meal at home 3 to 5 days each week - read food labels for fat, fiber, carbohydrates and portion size - reduce red meat to 2 to 3 times a week - be open to making changes - I can manage, know and watch for signs of a heart attack - if I have chest pain, call for help - learn about small changes that will make a big difference - learn my personal risk factors  Follow Up Plan: Face to Face appointment with care management team member scheduled for:  08-08-2021 at 945 am      Care Plan : RNCM: Fall Risk (Adult)  Updates made by Vanita Ingles, RN since 08/08/2021 12:00 AM  Completed 08/08/2021   Problem: Fall Risk Resolved 08/08/2021     Long-Range Goal: RNCM: Absence of Fall and Fall-Related Injury Completed 08/08/2021  Start Date: 04/14/2021  Expected End Date: 04/09/2022  Recent Progress: On track  Priority: High  Note:   Current Barriers: Resolving, duplicate goal  Knowledge Deficits related  to fall precautions in patient with  Decreased adherence to prescribed treatment for fall prevention Unable to independently manage falls and safety in patient with frequent falls, ER visit in May of2022 Lacks social connections Unable to perform IADLs independently Does not contact provider office for questions/concerns Frequent falls- last fall last week 04-04-2021 week Knowledge Deficits related to falls prevention and safety in patient with multiple chronic conditions  Care Coordination needs related to the request for a cane  in a patient with frequent falls  Chronic Disease Management support and education needs related to frequent falls in a patient with high fall risk with most recent fall last week Lacks caregiver support.  Film/video editor.  Literacy barriers Transportation barriers Non-adherence to prescribed medication regimen High Fall risk patient  Clinical Goal(s):  patient will demonstrate improved adherence to prescribed treatment plan for decreasing falls as evidenced by patient reporting and review of EMR patient will verbalize using fall risk reduction strategies discussed patient will not experience additional falls patient will verbalize understanding of plan for fall prevention and safety patient will work  with RNCM, CCM team, and pcp  to address needs related to falls prevention and safety and the need for a cane to assist with ambulation patient will demonstrate a decrease in fall exacerbations patient will take all medications exactly as prescribed and will call provider for medication related questions patient will attend all scheduled medical appointments: 08-08-2021 at 10 am patient will demonstrate improved adherence to prescribed treatment plan for falls prevention and safety  patient will demonstrate improved health management independence patient will verbalize basic understanding of multiple chronic conditions increasing risk of falls  disease process  and self health management plan patient will demonstrate understanding of rationale for each prescribed medication the patient will demonstrate ongoing self health care management ability Interventions:  Collaboration with [name], NP regarding development and update of comprehensive plan of care as evidenced by provider attestation and co-signature Inter-disciplinary care team collaboration (see longitudinal plan of care) Provided written and verbal education re: Potential causes of falls and Fall prevention strategies Reviewed medications and discussed potential side effects of medications such as dizziness and frequent urination Assessed for s/s of orthostatic hypotension. 06-09-2021: Denies any issues with orthostatic hypotension Assessed for falls since last encounter. 04-14-2021- the patient states that she had a fall last week when it was raining and she had on slippery shoes. She hit her arm, did not go to the ER. The patient had a fall in May with injury and broken ribs with ER visit. 06-09-2021: Denies any new falls. States she is being careful.  Assessed patients knowledge of fall risk prevention secondary to previously provided education. Assessed working status of life alert bracelet and patient adherence Provided patient information for fall alert systems Evaluation of current treatment plan related to falls prevention and safety  and patient's adherence to plan as established by provider. 06-09-2021: The patient states she is doing well with safety. Denies any new falls. States her right arm is hurting where she got the flu vaccine and she can hardly move it. She states that it is not getting better. Agrees to come into the office to be seen. Will have office staff call patient to set up an appointment for evaluation. Will continue to monitor.  Advised patient to call the office for new falls or injuries  Provided education to patient re: falls prevention and safety and working with  the CCM team to optimize health and well being and prevent falls. The patient has a walker but states that it is "too big" and it causes her to trip up. The patient thinks a cane will be more helpful.  Collaborated with pcp regarding the patients request for a cane to use when ambulating  Provided patient with fall prevention  educational materials related to being a high fall risk and the need to be safe in the home and preventing injuries from falls.  Reviewed scheduled/upcoming provider appointments including: 08-08-2021 at 10 am Discussed plans with patient for ongoing care management follow up and provided patient with direct contact information for care management team Self-Care Deficits:  Unable to independently manage falls and safety in patient with multiple chronic conditions.  Lacks social connections Unable to perform IADLs independently Does not contact provider office for questions/concerns Patient Goals:  - Utilize walker and will assist with obtaining a cane to use when ambulating  (assistive device) appropriately with all ambulation - De-clutter walkways - Change positions slowly - Wear secure fitting shoes at all times with ambulation - Utilize home lighting for dim  lit areas - Demonstrate self and pet awareness at all times Follow Up Plan: Face to Face appointment with care management team member scheduled for:  08-08-2021 at 945 am    Care Plan : RNCM: Depression (Adult)  Updates made by Vanita Ingles, RN since 08/08/2021 12:00 AM  Completed 08/08/2021   Problem: RNCM: Symptoms (Depression) Resolved 08/08/2021  Priority: High     Long-Range Goal: Symptoms Monitored and Managed Completed 08/08/2021  Start Date: 06/09/2021  Expected End Date: 06/09/2022  Recent Progress: On track  Priority: High  Note:   Current Barriers: Resolving, duplicate goal  Ineffective Self Health Maintenance in a patient with Depression Unable to independently manage depression as  evidence of stress and circumstances impacting her health and well being Lacks social connections Does not contact provider office for questions/concerns Clinical Goal(s):  Collaboration with [name], NP regarding development and update of comprehensive plan of care as evidenced by provider attestation and co-signature Inter-disciplinary care team collaboration (see longitudinal plan of care) patient will work with care management team to address care coordination and chronic disease management needs related to [facility] Level of Care Concerns   Interventions:  Evaluation of current treatment plan related to Depression, Limited social support, Level of care concerns, Family and relationship dysfunction, and Substance abuse issues -  smoker   self-management and patient's adherence to plan as established by provider. 06-09-2021: The patient states that she is stressed out about her sons and that is likely the reason she has had chest pain and had to go to the ER and be evaluated. One of her sons is in prison and one has a "crazy" girlfriend and he keeps staying with her. He calls the patient when things go wrong. The patient states she wants to be there for her kids because their father wasn't. The patient talked about how she wants to help her kids. Reflective listening and support given. The patient knows she can talk to the CCM team about her concerns. Reviewed how stress impacts her health and well being. She states she is trying not to let things bother her. She will see the cardiologist on 06-10-2021.  Collaboration with [name], NP regarding development and update of comprehensive plan of care as evidenced by provider attestation       and co-signature Inter-disciplinary care team collaboration (see longitudinal plan of care) Discussed plans with patient for ongoing care management follow up and provided  patient with direct contact information for care management team Self Care Activities:  Patient verbalizes understanding of plan to effectively manage depression and mental health  Self administers medications as prescribed Attends all scheduled provider appointments Calls pharmacy for medication refills Attends church or other social activities Performs ADL's independently Performs IADL's independently Calls provider office for new concerns or questions Patient Goals: - activity or exercise based on tolerance encouraged - complementary therapy use encouraged - depression screen reviewed - emotional support provided - healthy lifestyle promoted - medication side effects monitored and managed - pain managed - participation in mental health treatment encouraged - quality of sleep assessed - response to mental health treatment monitored - response to pharmacologic therapy monitored - sleep hygiene techniques encouraged Follow Up Plan: Face to Face appointment with care management team member scheduled for:   08-08-2021 at 0945 am    Care Plan : RNCM: General Plan of Care (Adult) for Chronic Disease Management and Care Coordination Needs  Updates made by Vanita Ingles, RN since 08/08/2021 12:00 AM     Problem: RNCM: Development of Plan of Care For Chronic Disease Management (HTN, HLD, DM, OA, COPD, Depression)   Priority: High     Long-Range Goal: RNCM: Effective Management  of Plan of Care For Chronic Disease Management (HTN, HLD, DM, OA, COPD, Depression)   Start Date: 08/08/2021  Expected End Date: 08/08/2022  Priority: High  Note:   Current Barriers:  Knowledge Deficits related to plan of care for management of HTN, HLD, COPD, DMII, Depression: depressed mood anxiety, and chronic pain with OA to right shoulder  Care Coordination needs related to Limited social support, Mental Health Concerns , and Literacy concerns  Chronic Disease Management support and education needs  related to HTN, HLD, COPD, DMII, Depression: depressed mood anxiety, and Chronic Pain/OA Lacks caregiver support.         RNCM Clinical Goal(s):  Patient will verbalize basic understanding of HTN, HLD, COPD, DMII, Depression, and Osteoarthritis disease process and self health management plan as evidenced by following plan of care, keeping appointments, calling the provider office for changes in conditions and working with the CCM team to optimize health and well being  take all medications exactly as prescribed and will call provider for medication related questions as evidenced by compliance with medications and calling the office for refill needs     attend all scheduled medical appointments: sees specialist on 08-16-2021  as evidenced by keeping appointments and calling the office for needed schedule changes        continue to work with [facility] and/or Social Worker to address care management and care coordination needs related to HTN, HLD, COPD, DMII, Depression, and Osteoarthritis as evidenced by adherence to CM Team Scheduled appointments     demonstrate a decrease in HTN, HLD, COPD, DMII, Depression, and Osteoarthritis exacerbations  as evidenced by following the plan of care and calling for any changes or acute exacerbations in chronic conditions  demonstrate ongoing self health care management ability for effective management of Chronic conditions  as evidenced by  working with the CCM team through collaboration with Consulting civil engineer, provider, and care team.   Interventions: 1:1 collaboration with primary care provider regarding development and update of comprehensive plan of care as evidenced by provider attestation and co-signature Inter-disciplinary care team collaboration (see longitudinal plan of care) Evaluation of current treatment plan related to  self management and patient's adherence to plan as established by provider   COPD: (Status: Goal on Track (progressing): YES.)  Long Term Goal  Reviewed medications with patient, including use of prescribed maintenance and rescue inhalers, and provided instruction on medication management and the importance of adherence Provided patient with basic written and verbal COPD education on self care/management/and exacerbation prevention Advised patient to track and manage COPD triggers Provided written and verbal instructions on pursed lip breathing and utilized returned demonstration as teach back Provided instruction about proper use of medications used for management of COPD including inhalers Advised patient to self assesses COPD action plan zone and make appointment with provider if in the yellow zone for 48 hours without improvement Advised patient to engage in light exercise as tolerated 3-5 days a week to aid in the the management of COPD Provided education about and advised patient to utilize infection prevention strategies to reduce risk of respiratory infection Discussed the importance of adequate rest and management of fatigue with COPD Screening for signs and symptoms of depression related  to chronic disease state  Assessed social determinant of health barriers  Diabetes:  (Status: Goal on Track (progressing): YES.) Long Term Goal   Lab Results  Component Value Date   HGBA1C 7.7 (H) 08/01/2021  Assessed patient's understanding of A1c goal: <7%. 08-08-2021: States her A1C was elevated this time. Review of steroid shots may have caused some of the reason for elevation. The patient states she did not have her paper with her and is having to write down her numbers. Will mail the patient more sheets to record blood pressures and blood sugars. Provided education to patient about basic DM disease process; Reviewed medications with patient and discussed importance of medication adherence. 08-08-2021: The patient has started back on Metformin 500 mg BID, endorses compliance. Denies any issues.         Reviewed prescribed  diet with patient heart healthy/ADA; Counseled on importance of regular laboratory monitoring as prescribed;        Discussed plans with patient for ongoing care management follow up and provided patient with direct contact information for care management team;      Provided patient with written educational materials related to hypo and hyperglycemia and importance of correct treatment. 08-08-2021: Denies any lows but states she knows how to monitor and treat       Reviewed scheduled/upcoming provider appointments including: no appointments scheduled with pcp, but knows to call for changes;         Advised patient, providing education and rationale, to check cbg before meals and at bedtime, when you have symptoms of low or high blood sugar, and before and after exercise and record        call provider for findings outside established parameters;       Review of patient status, including review of consultants reports, relevant laboratory and other test results, and medications completed;        Depression  (Status: Goal on Track (progressing): YES.) Long Term Goal  Evaluation of current treatment plan related to Depression, Mental Health Concerns  self-management and patient's adherence to plan as established by provider. Discussed plans with patient for ongoing care management follow up and provided patient with direct contact information for care management team Advised patient to call the office for changes in mood, anxiety, or depression ; Provided education to patient re: staying positive, being around positive people, not being around negativity, pacing activity and working with the CCM team to effectively manage mental health and well being; Reviewed medications with patient and discussed compliance ; Discussed plans with patient for ongoing care management follow up and provided patient with direct contact information for care management team; Screening for signs and symptoms of depression  related to chronic disease state;  Assessed social determinant of health barriers;   Hyperlipidemia:  (Status: Goal on Track (progressing): YES.) Long Term Goal  Lab Results  Component Value Date   CHOL 135 08/01/2021   HDL 65 08/01/2021   LDLCALC 53 08/01/2021   TRIG 89 08/01/2021   CHOLHDL 2.1 08/01/2021     Medication review performed; medication list updated in electronic medical record.  Provider established cholesterol goals reviewed; Counseled on importance of regular laboratory monitoring as prescribed; Provided HLD educational materials; Reviewed role and benefits of statin for ASCVD risk reduction; Discussed strategies to manage statin-induced myalgias; Reviewed importance of limiting foods high in cholesterol;  Hypertension: (Status: Goal on Track (progressing): YES.) Last practice recorded BP readings:  BP Readings from Last 3 Encounters:  08/07/21 115/84  08/01/21 109/65  06/28/21 119/78  Most recent eGFR/CrCl:  Lab Results  Component Value Date   EGFR 72 08/01/2021    No components found for: CRCL  Evaluation of current treatment plan related to hypertension self management and patient's adherence to plan as established by provider;   Provided education to patient re: stroke prevention, s/s of heart attack and stroke; Reviewed prescribed diet heart healthy/ADA diet  Reviewed medications with patient and discussed importance of compliance;  Discussed plans with patient for ongoing care management follow up and provided patient with direct contact information for care management team; Advised patient, providing education and rationale, to monitor blood pressure daily and record, calling PCP for findings outside established parameters;  Advised patient to discuss blood pressure trends  with provider; Provided education on prescribed diet heart healthy/ADA ;  Discussed complications of poorly controlled blood pressure such as heart disease, stroke, circulatory  complications, vision complications, kidney impairment, sexual dysfunction;   Pain:  (Status: Goal on Track (progressing): YES.) Long Term Goal  Pain assessment performed. 08-08-2021: The patient rates her pain level at a "zero" today. She states most of the time she has pain at night that wakes her from her sleep. She says the medications help her. She follows up with the specialist on 08-16-2021. Advised the patient to discuss with the specialist when she is having pain.  Medications reviewed- 08-08-2021: Is compliant with medications Reviewed provider established plan for pain management; Discussed importance of adherence to all scheduled medical appointments. 08-08-2021: Has upcoming appointments with specialist on 08-16-2021; Counseled on the importance of reporting any/all new or changed pain symptoms or management strategies to pain management provider; Advised patient to report to care team affect of pain on daily activities; Discussed use of relaxation techniques and/or diversional activities to assist with pain reduction (distraction, imagery, relaxation, massage, acupressure, TENS, heat, and cold application. 08-08-2021: Education on using heat application to help with pain relief. Reviewed with patient prescribed pharmacological and nonpharmacological pain relief strategies; Advised patient to discuss unrelieved pain or changes in level of intensity of pain  with provider; 08-08-2021: The patient states she needs a new BSC because the leg of  hers broke. Will collaborate with the pcp about getting the patient a new chair.   Patient Goals/Self-Care Activities: Take medications as prescribed   Attend all scheduled provider appointments Call pharmacy for medication refills 3-7 days in advance of running out of medications Attend church or other social activities Perform all self care activities independently  Perform IADL's (shopping, preparing meals, housekeeping, managing finances)  independently Call provider office for new concerns or questions  Work with the social worker to address care coordination needs and will continue to work with the clinical team to address health care and disease management related needs call 911 call the Suicide and Crisis Lifeline: 988 call the [place] National Suicide Prevention Lifeline: 754-859-4419 call 1-800-273-TALK (toll free, 24 hour hotline) if experiencing a Mental Health or [place]  schedule appointment with eye doctor check blood sugar at prescribed times: when you have symptoms of low or high blood sugar, before and after exercise, and as directed   check feet daily for cuts, sores or redness enter blood sugar readings and medication or insulin into daily log take the blood sugar log to all doctor visits trim toenails straight across drink 6 to 8 glasses of water each day eat fish at least once per week fill half of plate with vegetables keep a food diary  limit fast food meals to no more than 1 per week manage portion size prepare main meal at home 3 to 5 days each week read food labels for fat, fiber, carbohydrates and portion size keep feet up while sitting wash and dry feet carefully every day wear comfortable, cotton socks wear comfortable, well-fitting shoes - avoid second hand smoke - eliminate smoking in my home - identify and remove indoor air pollutants - limit outdoor activity during cold weather - listen for public air quality announcements every day - do breathing exercises every day - develop a rescue plan - eliminate symptom triggers at home - follow rescue plan if symptoms flare-up - use an extra pillow to sleep - develop a new routine to improve sleep - don't eat or exercise right before bedtime - get at least 7 to 8 hours of sleep at night - use devices that will help like a cane, sock-puller or reacher - practice relaxation or meditation daily - do exercises in a comfortable  position that makes breathing as easy as possible check blood pressure daily choose a place to take my blood pressure (home, clinic or office, retail store) write blood pressure results in a log or diary learn about high blood pressure keep a blood pressure log take blood pressure log to all doctor appointments call doctor for signs and symptoms of high blood pressure develop an action plan for high blood pressure keep all doctor appointments take medications for blood pressure exactly as prescribed report new symptoms to your doctor eat more whole grains, fruits and vegetables, lean meats and healthy fats - call for medicine refill 2 or 3 days before it runs out - take all medications exactly as prescribed - call doctor with any symptoms you believe are related to your medicine - call doctor when you experience any new symptoms - go to all doctor appointments as scheduled - adhere to prescribed diet: heart healthy/ADA diet        Plan:Telephone follow up appointment with care management team member scheduled for:  10-10-2021 at 26 am  [name] RN, MSN, [facility] Mobile: [phone]

## 2021-08-12 ENCOUNTER — Other Ambulatory Visit: Payer: Self-pay | Admitting: Internal Medicine

## 2021-08-12 DIAGNOSIS — J432 Centrilobular emphysema: Secondary | ICD-10-CM

## 2021-08-12 DIAGNOSIS — J449 Chronic obstructive pulmonary disease, unspecified: Secondary | ICD-10-CM

## 2021-08-13 NOTE — Telephone Encounter (Signed)
Requested Prescriptions  Pending Prescriptions Disp Refills  . TRELEGY ELLIPTA 100-62.5-25 MCG/ACT AEPB [Pharmacy Med Name: TRELEGY ELLIPTA 100-62.5-25 MCG/ACT] 60 each 1    Sig: USE ONE INHALATION INTO THE LUNGS ONCE A DAY RINSE MOUTH AFTER EACH USE     There is no refill protocol information for this order    . albuterol (VENTOLIN HFA) 108 (90 Base) MCG/ACT inhaler [Pharmacy Med Name: ALBUTEROL SULFATE HFA 108 (90 BASE)] 8.5 g 1    Sig: INHALE 1-2 PUFFS BY MOUTH EVERY 6 HOURS AS NEEDED WHEEZING/ SHORTNESS OF BREATH     Pulmonology:  Beta Agonists Failed - 08/12/2021  9:25 AM      Failed - One inhaler should last at least one month. If the patient is requesting refills earlier, contact the patient to check for uncontrolled symptoms.      Passed - Valid encounter within last 12 months    Recent Outpatient Visits          1 week ago Controlled type 2 diabetes mellitus with complication, without long-term current use of insulin [facility])   [facility] [place], Coralie Keens, NP   1 month ago Chronic right shoulder pain   [facility] [place], Coralie Keens, NP   2 months ago Aortic atherosclerosis [facility])   [facility], NP   5 months ago Closed fracture of multiple ribs of left side, initial encounter   [place], NP   6 months ago Left hand pain   [facility] [place], Coralie Keens, NP      Future Appointments            In 3 months [name], Coralie Keens, NP [facility], [facility]

## 2021-08-15 ENCOUNTER — Other Ambulatory Visit: Payer: Self-pay | Admitting: Internal Medicine

## 2021-08-15 NOTE — Telephone Encounter (Signed)
Medication Refill - Medication: naproxen (NAPROSYN) 375 MG tablet  Has the patient contacted their pharmacy? Yes.   (Agent: If no, request that the patient contact the pharmacy for the refill. If patient does not wish to contact the pharmacy document the reason why and proceed with request.) (Agent: If yes, when and what did the pharmacy advise?)  Preferred Pharmacy (with phone number or street name):  [name] - [place], [place]. Phone:  [phone]  Fax:  [phone]     Has the patient been seen for an appointment in the last year OR does the patient have an upcoming appointment? Yes.    Agent: Please be advised that RX refills may take up to 3 business days. We ask that you follow-up with your pharmacy.

## 2021-08-16 MED ORDER — NAPROXEN 375 MG PO TABS
375.0000 mg | ORAL_TABLET | Freq: Two times a day (BID) | ORAL | 0 refills | Status: DC
Start: 2021-08-16 — End: 2021-11-07

## 2021-08-16 NOTE — Telephone Encounter (Signed)
Requested Prescriptions  Pending Prescriptions Disp Refills  . naproxen (NAPROSYN) 375 MG tablet 14 tablet 0    Sig: Take 1 tablet (375 mg total) by mouth 2 (two) times daily with a meal.     Analgesics:  NSAIDS Failed - 08/15/2021  7:27 PM      Failed - HGB in normal range and within 360 days    Hemoglobin  Date Value Ref Range Status  06/06/2021 11.6 (L) 12.0 - 15.0 g/dL Final   HGB  Date Value Ref Range Status  09/01/2014 12.7 12.0 - 16.0 g/dL Final         Passed - Cr in normal range and within 360 days    Creat  Date Value Ref Range Status  08/01/2021 0.85 0.60 - 1.00 mg/dL Final   Creatinine, Urine  Date Value Ref Range Status  05/31/2021 89 20 - 275 mg/dL Final         Passed - Patient is not pregnant      Passed - Valid encounter within last 12 months    Recent Outpatient Visits          2 weeks ago Controlled type 2 diabetes mellitus with complication, without long-term current use of insulin ([place])   [facility] [place], Coralie Keens, NP   1 month ago Chronic right shoulder pain   [facility] [place], Coralie Keens, NP   2 months ago Aortic atherosclerosis [facility])   [name] [place], Coralie Keens, NP   5 months ago Closed fracture of multiple ribs of left side, initial encounter   [place], Coralie Keens, NP   6 months ago Left hand pain   [facility] [place], Coralie Keens, NP      Future Appointments            In 3 months [name], Coralie Keens, NP [facility], [facility]

## 2021-08-17 DIAGNOSIS — F3341 Major depressive disorder, recurrent, in partial remission: Secondary | ICD-10-CM

## 2021-08-17 DIAGNOSIS — I152 Hypertension secondary to endocrine disorders: Secondary | ICD-10-CM

## 2021-08-17 DIAGNOSIS — E1159 Type 2 diabetes mellitus with other circulatory complications: Secondary | ICD-10-CM | POA: Diagnosis not present

## 2021-08-17 DIAGNOSIS — E1169 Type 2 diabetes mellitus with other specified complication: Secondary | ICD-10-CM

## 2021-08-17 DIAGNOSIS — E785 Hyperlipidemia, unspecified: Secondary | ICD-10-CM

## 2021-08-19 ENCOUNTER — Ambulatory Visit (INDEPENDENT_AMBULATORY_CARE_PROVIDER_SITE_OTHER): Payer: Medicare Other | Admitting: Pharmacist

## 2021-08-19 DIAGNOSIS — E118 Type 2 diabetes mellitus with unspecified complications: Secondary | ICD-10-CM

## 2021-08-19 NOTE — Chronic Care Management (AMB) (Signed)
Chronic Care Management CCM Pharmacy Note  08/19/2021 Name:  Kendra [name] MRN:  [id number] DOB:  April 14, 1946   Subjective: Kendra [name] is an 75 y.o. year old female who is a primary patient of [name], NP.  The CCM team was consulted for assistance with disease management and care coordination needs.    Engaged with patient by telephone for follow up visit for pharmacy case management and/or care coordination services.   Objective:  Medications Reviewed Today     Reviewed by Marlowe Sax, RN (Case Manager) on 08/08/21 at [phone]  Med List Status: <None>   Medication Order Taking? Sig Documenting Provider Last Dose Status Informant  Acetaminophen (TYLENOL 8 HOUR ARTHRITIS PAIN PO) [id number] No Take 2 capsules by mouth in the morning, at noon, in the evening, and at bedtime. [provider] Taking Active Multiple Informants  albuterol (VENTOLIN HFA) 108 (90 Base) MCG/ACT inhaler [id number] No INHALE 1-2 PUFFS BY MOUTH EVERY 6 HOURS AS NEEDED WHEEZING/ SHORTNESS OF BREATH [name], NP Taking Active   aspirin EC 81 MG tablet [id number] No Take 1 tablet (81 mg total) by mouth daily. Swallow whole. [name], NP Taking Active   atorvastatin (LIPITOR) 40 MG tablet [id number] No TAKE 1 TABLET BY MOUTH BEDTIME [name], NP Taking Active   Blood Glucose Monitoring Suppl [facility] VERIO) w/Device [name] [id number] No USE AS DIRECTED Anitra Lauth Jodelle Gross, FNP Taking Active Multiple Informants  buPROPion (WELLBUTRIN XL) 150 MG 24 hr tablet [id number] No TAKE 1 TABLET BY MOUTH ONCE DAILY [name], [name], NP Taking Active   buPROPion (WELLBUTRIN XL) 150 MG 24 hr tablet [id number]  Take 1 tablet by mouth daily. [provider]  Active   clopidogrel (PLAVIX) 75 MG tablet [id number] No Take 1 tablet (75 mg total) by mouth daily. [name], MD Taking Active   cyclobenzaprine (FLEXERIL) 5 MG tablet [id number] No Take 1 tablet (5 mg total) by mouth 3 (three) times  daily as needed for muscle spasms. [name], NP Taking Active   docusate sodium (COLACE) 100 MG capsule [id number] No Take 1 capsule (100 mg total) by mouth 2 (two) times daily as needed for mild constipation. [name], NP Taking Active   glucose blood [facility] VERIO) test strip [id number] No Use as instructed [name], DO Taking Active Multiple Informants  Lancet Devices (ONE TOUCH DELICA LANCING DEV) MISC [id number] No 1 Device by Does not apply route 2 (two) times daily. [place], Jodelle Gross, FNP Taking Active Multiple Informants  metFORMIN (GLUCOPHAGE) 500 MG tablet [id number]  Take 1 tablet (500 mg total) by mouth 2 (two) times daily with a meal. [name], NP  Active   naproxen (NAPROSYN) 375 MG tablet [id number]  Take 1 tablet (375 mg total) by mouth 2 (two) times daily with a meal. [name], NP  Active   OneTouch Delica Lancets 33G MISC [id number] No 1 Device by Does not apply route 2 (two) times daily. [name], DO Taking Active Multiple Informants  traZODone (DESYREL) 50 MG tablet [id number] No Take 0.5-1 tablets (25-50 mg total) by mouth at bedtime as needed for sleep. [name], NP Taking Active   [name] 100-62.5-25 MCG/INH AEPB [id number] No USE ONE INHALATION INTO THE LUNGS ONCE A DAY RINSE MOUTH AFTER EACH USE [name], NP Taking Active             Pertinent Labs:  Lab  Results  Component Value Date   HGBA1C 7.7 (H) 08/01/2021   Lab Results  Component Value Date   CHOL 135 08/01/2021   HDL 65 08/01/2021   LDLCALC 53 08/01/2021   TRIG 89 08/01/2021   CHOLHDL 2.1 08/01/2021   Lab Results  Component Value Date   CREATININE 0.85 08/01/2021   BUN 21 08/01/2021   NA 139 08/01/2021   K 4.5 08/01/2021   CL 103 08/01/2021   CO2 28 08/01/2021    SDOH:  (Social Determinants of Health) assessments and interventions performed:    [place]  Review of patient past medical history, allergies,  medications, health status, including review of consultants reports, laboratory and other test data, was performed as part of comprehensive evaluation and provision of chronic care management services.   Care Plan : PharmD - Medication Mgmt  Updates made by Rennis Petty, RPH-CPP since 08/19/2021 12:00 AM     Problem: Disease Progression      Long-Range Goal: Disease Progression Prevented or Minimized   Start Date: 11/01/2020  Expected End Date: 01/30/2021  This Visit's Progress: On track  Recent Progress: On track  Priority: High  Note:   Current Barriers:  Financial Barriers Limited social support  Pharmacist Clinical Goal(s):  Over the next 90 days, patient will maintain adherence to monitoring guidelines and medication adherence to achieve therapeutic efficacy through collaboration with PharmD and provider.   Interventions: 1:1 collaboration with Webb Silversmith, NP regarding development and update of comprehensive plan of care as evidenced by provider attestation and co-signature Inter-disciplinary care team collaboration (see longitudinal plan of care) Perform chart review.  Patient seen for Office Visit with PCP on 11/14. Provider advised: RX sent for Naproxen 375 mg BID Patient to continue to follow with orthopedics A1C 7.7% Provider sent Rx for metformin 500 mg twice daily with meals to pharmacy for patient on 11/18 Today reports has follow up appointment scheduled with orthopedics Encourage patient to follow up with office to confirm appointment time and then with transportation Counsel on importance of taking naproxen doses with food. Counsel patient to monitor for s/s of bleeding/bruising particularly while taking naproxen  Medication Management/adherence: Reports continues to take medication from pill pack as packaged by [name] Confirms aspirin 81 mg daily now incorporated in pill pack Confirms metformin 500 mg twice daily in pill pack  Tobacco  Use: Reports has reduced smoking; currently smokes ~1/2 cigarettes/day Motivation: her health; not wanting to have another procedure Encourage smoking cessation Patient not ready to set quit date today  T2DM: Current treatment: metformin 500 mg twice daily Reports having difficulty with using new Accu-Chek Guide Me blood sugar monitor States will bring monitor to [name] for assistance Encourage patient to have regular well-balanced meals throughout the day, limiting carbohydrate portion sizes Once able to use monitor, encourage patient to monitor home blood sugars and keep a log of the results Will mail patient new blood sugar log as requested    Patient Goals/Self-Care Activities Over the next 90 days, patient will:  - take medications as prescribed  Using pill packaging from [facility] - check glucose, document, and provide at future appointments - check blood pressure, document, and provide at future appointments  Follow Up Plan: Telephone follow up appointment with care management team member scheduled for: 09/26/2021 at 1:00 PM       Wallace Cullens, PharmD, [place], [place] [phone]

## 2021-08-19 NOTE — Patient Instructions (Signed)
Visit Information  Thank you for taking time to visit with me today. Please don't hesitate to contact me if I can be of assistance to you before our next scheduled telephone appointment.  Following are the goals we discussed today:  -Please continue to keep log of home blood sugar results and have for [place] to review during our calls  Our goal bad cholesterol, or LDL, is less than 70 . This is why it is important to continue taking your atorvastatin.  I look forward to talking to you during our next telephone appointment. Please call if you need something sooner!  Wallace Cullens, PharmD, [place] [phone]   Our next appointment is by telephone on 09/26/2021 at 1:00 PM  Please call the care guide team at [phone] if you need to cancel or reschedule your appointment.    The patient verbalized understanding of instructions, educational materials, and care plan provided today and declined offer to receive copy of patient instructions, educational materials, and care plan.

## 2021-08-29 ENCOUNTER — Other Ambulatory Visit: Payer: Self-pay

## 2021-08-29 ENCOUNTER — Inpatient Hospital Stay: Payer: Medicare Other | Attending: Oncology

## 2021-08-29 DIAGNOSIS — I6523 Occlusion and stenosis of bilateral carotid arteries: Secondary | ICD-10-CM

## 2021-08-31 ENCOUNTER — Ambulatory Visit: Payer: Medicare Other | Admitting: Internal Medicine

## 2021-09-02 ENCOUNTER — Other Ambulatory Visit: Payer: Self-pay | Admitting: Internal Medicine

## 2021-09-02 NOTE — Telephone Encounter (Signed)
Request 30 day supply

## 2021-09-02 NOTE — Telephone Encounter (Signed)
Pt called in to request a refill for naproxen (NAPROSYN) 375 MG tablet . Pt would like to know if she could have a 30 day supply of her medication so that she do have to request as frequent? Pt says was told to contact her provider for refill.    Please advise.   Pharmacy:  [place], [place]. Phone:  [phone]  Fax:  [phone]     Last ov: 08/01/21   Future ov: 11/28/21

## 2021-09-03 NOTE — Telephone Encounter (Signed)
Requested medication (s) are due for refill today: yes  Requested medication (s) are on the active medication list: yes  Future visit scheduled: 09/21/21  Notes to clinic:  Did provider mean to only order 14 pills at two times a day? Please assess.   Requested Prescriptions  Pending Prescriptions Disp Refills   naproxen (NAPROSYN) 375 MG tablet 14 tablet 0    Sig: Take 1 tablet (375 mg total) by mouth 2 (two) times daily with a meal.     Analgesics:  NSAIDS Failed - 09/02/2021  2:42 PM      Failed - HGB in normal range and within 360 days    Hemoglobin  Date Value Ref Range Status  06/06/2021 11.6 (L) 12.0 - 15.0 g/dL Final   HGB  Date Value Ref Range Status  09/01/2014 12.7 12.0 - 16.0 g/dL Final          Passed - Cr in normal range and within 360 days    Creat  Date Value Ref Range Status  08/01/2021 0.85 0.60 - 1.00 mg/dL Final   Creatinine, Urine  Date Value Ref Range Status  05/31/2021 89 20 - 275 mg/dL Final          Passed - Patient is not pregnant      Passed - Valid encounter within last 12 months    Recent Outpatient Visits           1 month ago Controlled type 2 diabetes mellitus with complication, without long-term current use of insulin ([place])   [facility] [place], Coralie Keens, NP   2 months ago Chronic right shoulder pain   [place], Coralie Keens, NP   3 months ago Aortic atherosclerosis [facility])   [facility] [place], Coralie Keens, NP   6 months ago Closed fracture of multiple ribs of left side, initial encounter   [place], NP   7 months ago Left hand pain   [facility] [place], Coralie Keens, NP       Future Appointments             In 2 months [name], Coralie Keens, NP [facility], [facility]

## 2021-09-06 ENCOUNTER — Telehealth: Payer: Self-pay

## 2021-09-06 NOTE — Telephone Encounter (Signed)
Copied from [place] [id number]. Topic: General - Inquiry >> Sep 02, 2021  2:02 PM Greggory Keen D wrote: Reason for CRM: pt called asking for a paper to write her BP on ad keep up with.  She said it is usually mailed to her.  CB#  [phone]

## 2021-09-07 ENCOUNTER — Other Ambulatory Visit: Payer: Self-pay | Admitting: Internal Medicine

## 2021-09-07 DIAGNOSIS — E785 Hyperlipidemia, unspecified: Secondary | ICD-10-CM

## 2021-09-07 NOTE — Telephone Encounter (Signed)
Refill too soon .  Requested Prescriptions  Refused Prescriptions Disp Refills   buPROPion (WELLBUTRIN XL) 150 MG 24 hr tablet [Pharmacy Med Name: BUPROPION HCL ER (XL) 150 MG TAB] 90 tablet 0    Sig: TAKE 1 TABLET BY MOUTH ONCE DAILY     Psychiatry: Antidepressants - bupropion Passed - 09/07/2021  2:35 PM      Passed - Completed PHQ-2 or PHQ-9 in the last 360 days      Passed - Last BP in normal range    BP Readings from Last 1 Encounters:  08/07/21 115/84         Passed - Valid encounter within last 6 months    Recent Outpatient Visits          1 month ago Controlled type 2 diabetes mellitus with complication, without long-term current use of insulin ([place])   [facility] [place], Coralie Keens, NP   2 months ago Chronic right shoulder pain   [facility] [place], Coralie Keens, NP   3 months ago Aortic atherosclerosis [facility])   [facility] [place], Coralie Keens, NP   6 months ago Closed fracture of multiple ribs of left side, initial encounter   [facility] [place], Coralie Keens, NP   7 months ago Left hand pain   [facility] [place], Coralie Keens, NP      Future Appointments            In 2 months [place], Coralie Keens, NP [facility], [facility]            atorvastatin (LIPITOR) 40 MG tablet [Pharmacy Med Name: ATORVASTATIN CALCIUM 40 MG TAB] 90 tablet 0    Sig: TAKE 1 TABLET BY MOUTH BEDTIME     Cardiovascular:  Antilipid - Statins Passed - 09/07/2021  2:35 PM      Passed - Total Cholesterol in normal range and within 360 days    Cholesterol, Total  Date Value Ref Range Status  06/07/2015 177 100 - 199 mg/dL Final   Cholesterol  Date Value Ref Range Status  08/01/2021 135 <200 mg/dL Final  09/02/2014 141 0 - 200 mg/dL Final         Passed - LDL in normal range and within 360 days    Ldl Cholesterol, Calc  Date Value Ref Range Status  09/02/2014 73 0 - 100 mg/dL Final   LDL Cholesterol (Calc)  Date Value  Ref Range Status  08/01/2021 53 mg/dL (calc) Final    Comment:    Reference range: <100 . Desirable range <100 mg/dL for primary prevention;   <70 mg/dL for patients with CHD or diabetic patients  with > or = 2 CHD risk factors. [name] LDL-C is now calculated using the Martin-Hopkins  calculation, which is a validated novel method providing  better accuracy than the Friedewald equation in the  estimation of LDL-C.  Cresenciano Genre et al. Annamaria Helling. 1017;510(25): 2061-2068  (http://education.QuestDiagnostics.com/faq/FAQ164)          Passed - HDL in normal range and within 360 days    HDL Cholesterol  Date Value Ref Range Status  09/02/2014 37 (L) 40 - 60 mg/dL Final   HDL  Date Value Ref Range Status  08/01/2021 65 > OR = 50 mg/dL Final  06/07/2015 56 >39 mg/dL Final    Comment:    According to ATP-III Guidelines, HDL-C >59 mg/dL is considered a negative risk factor for CHD.  Passed - Triglycerides in normal range and within 360 days    Triglycerides  Date Value Ref Range Status  08/01/2021 89 <150 mg/dL Final  09/02/2014 155 0 - 200 mg/dL Final         Passed - Patient is not pregnant      Passed - Valid encounter within last 12 months    Recent Outpatient Visits          1 month ago Controlled type 2 diabetes mellitus with complication, without long-term current use of insulin ([place])   [facility] [place], Coralie Keens, NP   2 months ago Chronic right shoulder pain   [facility] [place], Coralie Keens, NP   3 months ago Aortic atherosclerosis [facility])   [facility] [place], Coralie Keens, NP   6 months ago Closed fracture of multiple ribs of left side, initial encounter   [place], NP   7 months ago Left hand pain   [facility] [place], Coralie Keens, NP      Future Appointments            In 2 months [name], Coralie Keens, NP [facility], [facility]

## 2021-09-07 NOTE — Telephone Encounter (Unsigned)
This encounter was created in error - please disregard.

## 2021-09-08 ENCOUNTER — Other Ambulatory Visit: Payer: Self-pay | Admitting: Internal Medicine

## 2021-09-08 NOTE — Telephone Encounter (Signed)
Requested medication (s) are due for refill today: I think a new brand of meter was ordered due to insurance preference.  Requested medication (s) are on the active medication list: for different brand meter.  Last refill:  08/09/21  Future visit scheduled: 11/28/21  Notes to clinic:  current rx is for different brand of meter, this request for supplies for accu check. Last ordered in Nov for brand meter that insurance covers. Please assess.   Requested Prescriptions  Pending Prescriptions Disp Refills   Accu-Chek Softclix Lancets lancets [Pharmacy Med Name: ACCU-CHEK SOFTCLIX LANCETS] 100 each     Sig: USE AS DIRECTED. UP TO 4 TIMES A DAY     Endocrinology: Diabetes - Testing Supplies Passed - 09/08/2021  6:24 PM      Passed - Valid encounter within last 12 months    Recent Outpatient Visits           1 month ago Controlled type 2 diabetes mellitus with complication, without long-term current use of insulin ([place])   [facility] [place], Coralie Keens, NP   2 months ago Chronic right shoulder pain   [facility] [place], Coralie Keens, NP   3 months ago Aortic atherosclerosis [facility])   [facility], NP   6 months ago Closed fracture of multiple ribs of left side, initial encounter   [place], NP   7 months ago Left hand pain   [facility] [place], Coralie Keens, NP       Future Appointments             In 2 months Garnette Gunner, Coralie Keens, NP [place] test strip [place] Med Name: ACCU-CHEK GUIDE STRIP] 100 each     Sig: USE AS DIRECTED. UP TO 4 TIMES DAILY ONCE DAILY     Endocrinology: Diabetes - Testing Supplies Passed - 09/08/2021  6:24 PM      Passed - Valid encounter within last 12 months    Recent Outpatient Visits           1 month ago Controlled type 2 diabetes mellitus with complication, without long-term current use of  insulin [facility])   [facility] [place], Coralie Keens, NP   2 months ago Chronic right shoulder pain   [facility] [place], Coralie Keens, NP   3 months ago Aortic atherosclerosis [facility])   [name], NP   6 months ago Closed fracture of multiple ribs of left side, initial encounter   [place], NP   7 months ago Left hand pain   [facility] [place], Coralie Keens, NP       Future Appointments             In 2 months [name], Coralie Keens, NP [facility], [facility]

## 2021-09-14 ENCOUNTER — Ambulatory Visit (HOSPITAL_COMMUNITY): Payer: Medicare Other | Attending: Physician Assistant

## 2021-09-14 ENCOUNTER — Ambulatory Visit: Payer: Medicare Other

## 2021-09-17 DIAGNOSIS — E118 Type 2 diabetes mellitus with unspecified complications: Secondary | ICD-10-CM

## 2021-09-19 ENCOUNTER — Encounter: Payer: Self-pay | Admitting: Oncology

## 2021-09-21 ENCOUNTER — Telehealth: Payer: Self-pay | Admitting: Licensed Clinical Social Worker

## 2021-09-21 ENCOUNTER — Telehealth: Payer: Medicare Other

## 2021-09-21 NOTE — Telephone Encounter (Signed)
° °   Clinical Social Work  Care Management   Phone Outreach    09/21/2021 Name: Kendra [name] MRN: [id number] DOB: 05-18-1946  Kendra [name] is a 76 y.o. year old female who is a primary care patient of [name], NP .   Reason for referral: Mental Health Counseling and Resources.    F/U phone call today to assess needs, progress and barriers with care plan goals.   Unable to leave a HIPPA compliant phone message due to voice mail not set up.  Plan:CCM LCSW will wait for return call. If no return call is received, Will route chart to Care Guide to see if patient would like to reschedule phone appointment   Review of patient status, including review of consultants reports, relevant laboratory and other test results, and collaboration with appropriate care team members and the patient's provider was performed as part of comprehensive patient evaluation and provision of care management services.     Christa See, MSW, [facility] Care Management [email or web address] Phone [phone] 1:09 PM

## 2021-09-26 ENCOUNTER — Telehealth: Payer: Self-pay | Admitting: Pharmacist

## 2021-09-26 ENCOUNTER — Telehealth: Payer: Medicare Other

## 2021-09-26 NOTE — Telephone Encounter (Signed)
°  Chronic Care Management   Outreach Note  09/26/2021 Name: Kendra [name] MRN: [id number] DOB: June 11, 1946  Referred by: [name], NP Reason for referral : No chief complaint on file.  Was unable to reach patient via telephone today and have left HIPAA compliant voicemail asking patient to return my call.    Follow Up Plan: Will collaborate with Care Guide to outreach to schedule follow up with me  Wallace Cullens, PharmD, [place] Management [phone]

## 2021-09-30 ENCOUNTER — Ambulatory Visit: Payer: Self-pay | Admitting: *Deleted

## 2021-09-30 NOTE — Telephone Encounter (Signed)
Summary: Rt hip pain   Pt called saying she is having rt hip pain and wants to know if Webb Silversmith will send her something to the pharmacy.   She uses [facility].  She has an appt with the nurse case manager next week.  She did not want to make an appt for the hip pain .   CB#  [phone]     Attempted to call patient- no answer and mailbox is full- unable to leave message

## 2021-09-30 NOTE — Telephone Encounter (Addendum)
2nd attempt, Patient called, unable to leave VM d/t mailbox full.

## 2021-09-30 NOTE — Telephone Encounter (Signed)
3rd attempt, pt called unable to leave VM. Routing to provider

## 2021-10-04 ENCOUNTER — Telehealth: Payer: Self-pay

## 2021-10-04 ENCOUNTER — Ambulatory Visit: Payer: Self-pay

## 2021-10-04 NOTE — Telephone Encounter (Signed)
°  Chief Complaint: cough Symptoms: lt yellow phlegm, runny nose Frequency: few days Pertinent Negatives: Patient denies SOB, Fever,chest pain Disposition: [] ED /[x] Urgent Care (no appt availability in office) / [] Appointment(In office/virtual)/ []  [place] [facility]/ [] Home Care/ [] Refused Recommended Disposition /[] [place] [facility]/ []  Follow-up with PCP Additional Notes: no VV openings or in office  appt's- advised UC or [place] VV      Reason for Disposition  [1] Known COPD or other severe lung disease (i.e., bronchiectasis, cystic fibrosis, lung surgery) AND [2] worsening symptoms (i.e., increased sputum purulence or amount, increased breathing difficulty  Answer Assessment - Initial Assessment Questions 1. ONSET: "When did the cough begin?"      Few days 2. SEVERITY: "How bad is the cough today?"      frequently 3. SPUTUM: "Describe the color of your sputum" (none, dry cough; clear, white, yellow, green)     Lt yellow 4. HEMOPTYSIS: "Are you coughing up any blood?" If so ask: "How much?" (flecks, streaks, tablespoons, etc.)     no 5. DIFFICULTY BREATHING: "Are you having difficulty breathing?" If Yes, ask: "How bad is it?" (e.g., mild, moderate, severe)    - MILD: No SOB at rest, mild SOB with walking, speaks normally in sentences, can lie down, no retractions, pulse < 100.    - MODERATE: SOB at rest, SOB with minimal exertion and prefers to sit, cannot lie down flat, speaks in phrases, mild retractions, audible wheezing, pulse 100-120.    - SEVERE: Very SOB at rest, speaks in single words, struggling to breathe, sitting hunched forward, retractions, pulse > 120      none 6. FEVER: "Do you have a fever?" If Yes, ask: "What is your temperature, how was it measured, and when did it start?"     no 7. CARDIAC HISTORY: "Do you have any history of heart disease?" (e.g., heart attack, congestive heart failure)      *No Answer* 8. LUNG HISTORY: "Do you have any history of  lung disease?"  (e.g., pulmonary embolus, asthma, emphysema)     COPD 9. PE RISK FACTORS: "Do you have a history of blood clots?" (or: recent major surgery, recent prolonged travel, bedridden)     no 10. OTHER SYMPTOMS: "Do you have any other symptoms?" (e.g., runny nose, wheezing, chest pain)       Runy nose 11. PREGNANCY: "Is there any chance you are pregnant?" "When was your last menstrual period?"       *No Answer* 12. TRAVEL: "Have you traveled out of the country in the last month?" (e.g., travel history, exposures)       *No Answer*  Protocols used: Cough - Acute Productive-A-AH

## 2021-10-04 NOTE — Chronic Care Management (AMB) (Signed)
°  Care Management   Note  10/04/2021 Name: AMANTHA SKLAR MRN: [id number] DOB: 09/28/1945  Kendra [name] is a 76 y.o. year old female who is a primary care patient of Garnette Gunner, Coralie Keens, NP and is actively engaged with the care management team. I reached out to [name] by phone today to assist with re-scheduling a follow up visit with the Pharmacist  Follow up plan: Unsuccessful telephone outreach attempt made. A HIPAA compliant phone message was left for the patient providing contact information and requesting a return call.  The care management team will reach out to the patient again over the next 7 days.  If patient returns call to provider office, please advise to call [facility]  at [place], [place], [place], [place] [address] Direct Dial: [phone] [email or web address]: [email or web address]

## 2021-10-06 ENCOUNTER — Other Ambulatory Visit: Payer: Self-pay | Admitting: Internal Medicine

## 2021-10-06 ENCOUNTER — Encounter: Payer: Self-pay | Admitting: Oncology

## 2021-10-06 ENCOUNTER — Telehealth: Payer: Self-pay

## 2021-10-06 ENCOUNTER — Other Ambulatory Visit: Payer: Self-pay

## 2021-10-06 ENCOUNTER — Encounter: Payer: Self-pay | Admitting: Internal Medicine

## 2021-10-06 ENCOUNTER — Ambulatory Visit (INDEPENDENT_AMBULATORY_CARE_PROVIDER_SITE_OTHER): Payer: 59 | Admitting: Internal Medicine

## 2021-10-06 ENCOUNTER — Emergency Department: Payer: 59

## 2021-10-06 ENCOUNTER — Emergency Department
Admission: EM | Admit: 2021-10-06 | Discharge: 2021-10-06 | Disposition: A | Discharge status: Home / Self Care | Payer: 59 | Attending: Emergency Medicine | Admitting: Emergency Medicine

## 2021-10-06 DIAGNOSIS — J40 Bronchitis, not specified as acute or chronic: Secondary | ICD-10-CM | POA: Insufficient documentation

## 2021-10-06 DIAGNOSIS — Z720 Tobacco use: Secondary | ICD-10-CM

## 2021-10-06 DIAGNOSIS — J45909 Unspecified asthma, uncomplicated: Secondary | ICD-10-CM | POA: Insufficient documentation

## 2021-10-06 DIAGNOSIS — R112 Nausea with vomiting, unspecified: Secondary | ICD-10-CM

## 2021-10-06 DIAGNOSIS — I700 Atherosclerosis of aorta: Secondary | ICD-10-CM

## 2021-10-06 DIAGNOSIS — R6883 Chills (without fever): Secondary | ICD-10-CM

## 2021-10-06 DIAGNOSIS — R0981 Nasal congestion: Secondary | ICD-10-CM | POA: Diagnosis not present

## 2021-10-06 DIAGNOSIS — R079 Chest pain, unspecified: Secondary | ICD-10-CM | POA: Diagnosis not present

## 2021-10-06 DIAGNOSIS — Z20822 Contact with and (suspected) exposure to covid-19: Secondary | ICD-10-CM | POA: Diagnosis not present

## 2021-10-06 DIAGNOSIS — J410 Simple chronic bronchitis: Secondary | ICD-10-CM

## 2021-10-06 DIAGNOSIS — E1169 Type 2 diabetes mellitus with other specified complication: Secondary | ICD-10-CM

## 2021-10-06 DIAGNOSIS — J449 Chronic obstructive pulmonary disease, unspecified: Secondary | ICD-10-CM | POA: Insufficient documentation

## 2021-10-06 DIAGNOSIS — E118 Type 2 diabetes mellitus with unspecified complications: Secondary | ICD-10-CM

## 2021-10-06 DIAGNOSIS — I10 Essential (primary) hypertension: Secondary | ICD-10-CM | POA: Insufficient documentation

## 2021-10-06 DIAGNOSIS — J029 Acute pharyngitis, unspecified: Secondary | ICD-10-CM | POA: Diagnosis not present

## 2021-10-06 DIAGNOSIS — F17200 Nicotine dependence, unspecified, uncomplicated: Secondary | ICD-10-CM | POA: Diagnosis not present

## 2021-10-06 DIAGNOSIS — I152 Hypertension secondary to endocrine disorders: Secondary | ICD-10-CM

## 2021-10-06 DIAGNOSIS — I6523 Occlusion and stenosis of bilateral carotid arteries: Secondary | ICD-10-CM

## 2021-10-06 DIAGNOSIS — R519 Headache, unspecified: Secondary | ICD-10-CM

## 2021-10-06 DIAGNOSIS — R52 Pain, unspecified: Secondary | ICD-10-CM

## 2021-10-06 DIAGNOSIS — E119 Type 2 diabetes mellitus without complications: Secondary | ICD-10-CM | POA: Diagnosis not present

## 2021-10-06 DIAGNOSIS — E785 Hyperlipidemia, unspecified: Secondary | ICD-10-CM

## 2021-10-06 DIAGNOSIS — E1159 Type 2 diabetes mellitus with other circulatory complications: Secondary | ICD-10-CM

## 2021-10-06 DIAGNOSIS — R051 Acute cough: Secondary | ICD-10-CM

## 2021-10-06 LAB — CBC
HCT: 33.5 % — ABNORMAL LOW (ref 36.0–46.0)
Hemoglobin: 11.4 g/dL — ABNORMAL LOW (ref 12.0–15.0)
MCH: 32.3 pg (ref 26.0–34.0)
MCHC: 34 g/dL (ref 30.0–36.0)
MCV: 94.9 fL (ref 80.0–100.0)
Platelets: 259 10*3/uL (ref 150–400)
RBC: 3.53 MIL/uL — ABNORMAL LOW (ref 3.87–5.11)
RDW: 12.6 % (ref 11.5–15.5)
WBC: 5.3 10*3/uL (ref 4.0–10.5)
nRBC: 0 % (ref 0.0–0.2)

## 2021-10-06 LAB — BASIC METABOLIC PANEL
Anion gap: 10 (ref 5–15)
BUN: 22 mg/dL (ref 8–23)
CO2: 25 mmol/L (ref 22–32)
Calcium: 9.8 mg/dL (ref 8.9–10.3)
Chloride: 101 mmol/L (ref 98–111)
Creatinine, Ser: 0.82 mg/dL (ref 0.44–1.00)
GFR, Estimated: >60 mL/min (ref >60)
Glucose, Bld: 139 mg/dL — ABNORMAL HIGH (ref 70–99)
Potassium: 3.8 mmol/L (ref 3.5–5.1)
Sodium: 136 mmol/L (ref 135–145)

## 2021-10-06 LAB — RESP PANEL BY RT-PCR (FLU A&B, COVID) ARPGX2
Influenza A by PCR: NEGATIVE
Influenza B by PCR: NEGATIVE
SARS Coronavirus 2 by RT PCR: NEGATIVE

## 2021-10-06 LAB — TROPONIN I (HIGH SENSITIVITY)
Troponin I (High Sensitivity): 4 ng/L (ref <18)
Troponin I (High Sensitivity): 3 ng/L (ref <18)

## 2021-10-06 LAB — D-DIMER, QUANTITATIVE: D-Dimer, Quant: 0.66 ug/mL-FEU — ABNORMAL HIGH (ref 0.00–0.50)

## 2021-10-06 MED ORDER — ACETAMINOPHEN 500 MG PO TABS
1000.0000 mg | ORAL_TABLET | Freq: Once | ORAL | Status: AC
  Administered 2021-10-06: 1000 mg via ORAL
  Filled 2021-10-06: qty 2

## 2021-10-06 NOTE — Telephone Encounter (Signed)
Requested Prescriptions  Pending Prescriptions Disp Refills   atorvastatin (LIPITOR) 40 MG tablet [Pharmacy Med Name: ATORVASTATIN CALCIUM 40 MG TAB] 90 tablet 0    Sig: TAKE 1 TABLET BY MOUTH BEDTIME     Cardiovascular:  Antilipid - Statins Passed - 10/06/2021  2:18 PM      Passed - Total Cholesterol in normal range and within 360 days    Cholesterol, Total  Date Value Ref Range Status  06/07/2015 177 100 - 199 mg/dL Final   Cholesterol  Date Value Ref Range Status  08/01/2021 135 <200 mg/dL Final  09/02/2014 141 0 - 200 mg/dL Final         Passed - LDL in normal range and within 360 days    Ldl Cholesterol, Calc  Date Value Ref Range Status  09/02/2014 73 0 - 100 mg/dL Final   LDL Cholesterol (Calc)  Date Value Ref Range Status  08/01/2021 53 mg/dL (calc) Final    Comment:    Reference range: <100 . Desirable range <100 mg/dL for primary prevention;   <70 mg/dL for patients with CHD or diabetic patients  with > or = 2 CHD risk factors. [name] LDL-C is now calculated using the Martin-Hopkins  calculation, which is a validated novel method providing  better accuracy than the Friedewald equation in the  estimation of LDL-C.  Cresenciano Genre et al. Annamaria Helling. 4709;628(36): 2061-2068  (http://education.Quest[employer].com/faq/FAQ164)          Passed - HDL in normal range and within 360 days    HDL Cholesterol  Date Value Ref Range Status  09/02/2014 37 (L) 40 - 60 mg/dL Final   HDL  Date Value Ref Range Status  08/01/2021 65 > OR = 50 mg/dL Final  06/07/2015 56 >39 mg/dL Final    Comment:    According to ATP-III Guidelines, HDL-C >59 mg/dL is considered a negative risk factor for CHD.          Passed - Triglycerides in normal range and within 360 days    Triglycerides  Date Value Ref Range Status  08/01/2021 89 <150 mg/dL Final  09/02/2014 155 0 - 200 mg/dL Final         Passed - Patient is not pregnant      Passed - Valid encounter within last 12 months    Recent  Outpatient Visits          Today Chest pain, unspecified type   [facility] [place], Coralie Keens, NP   2 months ago Controlled type 2 diabetes mellitus with complication, without long-term current use of insulin [facility])   [facility] [place], Coralie Keens, NP   3 months ago Chronic right shoulder pain   [facility] [place], Coralie Keens, NP   4 months ago Aortic atherosclerosis [facility])   [facility] [place], Coralie Keens, NP   7 months ago Closed fracture of multiple ribs of left side, initial encounter   [facility] [place], Coralie Keens, NP      Future Appointments            In 1 month [place], Coralie Keens, NP [facility], [facility]            buPROPion (WELLBUTRIN XL) 150 MG 24 hr tablet [Pharmacy Med Name: BUPROPION HCL ER (XL) 150 MG TAB] 90 tablet 0    Sig: TAKE 1 TABLET BY MOUTH ONCE DAILY     Psychiatry: Antidepressants - bupropion Passed -  10/06/2021  2:18 PM      Passed - Completed PHQ-2 or PHQ-9 in the last 360 days      Passed - Last BP in normal range    BP Readings from Last 1 Encounters:  10/06/21 121/73         Passed - Valid encounter within last 6 months    Recent Outpatient Visits          Today Chest pain, unspecified type   [name] [place], Coralie Keens, NP   2 months ago Controlled type 2 diabetes mellitus with complication, without long-term current use of insulin [facility])   [facility], Coralie Keens, NP   3 months ago Chronic right shoulder pain   [facility] [place], Coralie Keens, NP   4 months ago Aortic atherosclerosis [facility])   [facility] [place], Coralie Keens, NP   7 months ago Closed fracture of multiple ribs of left side, initial encounter   [name] [place], Coralie Keens, NP      Future Appointments            In 1 month [name], Coralie Keens, NP [facility], [facility]

## 2021-10-06 NOTE — ED Notes (Signed)
Pt c/o cough with midline CP, onset yesterday.

## 2021-10-06 NOTE — Patient Instructions (Signed)
Nonspecific Chest Pain Chest pain can be caused by many different conditions. Some causes of chest pain can be life-threatening. These will require treatment right away. Serious causes of chest pain include: Heart attack. A tear in the body's main blood vessel. Redness and swelling (inflammation) around your heart. Blood clot in your lungs. Other causes of chest pain may not be so serious. These include: Heartburn. Anxiety or stress. Damage to bones or muscles in your chest. Lung infections. Chest pain can feel like: Pain or discomfort in your chest. Crushing, pressure, aching, or squeezing pain. Burning or tingling. Dull or sharp pain that is worse when you move, cough, or take a deep breath. Pain or discomfort that is also felt in your back, neck, jaw, shoulder, or arm, or pain that spreads to any of these areas. It is hard to know whether your pain is caused by something that is serious or something that is not so serious. So it is important to see your doctor right away if you have chest pain. Follow these instructions at home: Medicines Take over-the-counter and prescription medicines only as told by your doctor. If you were prescribed an antibiotic medicine, take it as told by your doctor. Do not stop taking the antibiotic even if you start to feel better. Lifestyle  Rest as told by your doctor. Do not use any products that contain nicotine or tobacco, such as cigarettes, e-cigarettes, and chewing tobacco. If you need help quitting, ask your doctor. Do not drink alcohol. Make lifestyle changes as told by your doctor. These may include: Getting regular exercise. Ask your doctor what activities are safe for you. Eating a heart-healthy diet. A diet and nutrition specialist (dietitian) can help you to learn healthy eating options. Staying at a healthy weight. Treating diabetes or high blood pressure, if needed. Lowering your stress. Activities such as yoga and relaxation techniques  can help. General instructions Pay attention to any changes in your symptoms. Tell your doctor about them or any new symptoms. Avoid any activities that cause chest pain. Keep all follow-up visits as told by your doctor. This is important. You may need more testing if your chest pain does not go away. Contact a doctor if: Your chest pain does not go away. You feel depressed. You have a fever. Get help right away if: Your chest pain is worse. You have a cough that gets worse, or you cough up blood. You have very bad (severe) pain in your belly (abdomen). You pass out (faint). You have either of these for no clear reason: Sudden chest discomfort. Sudden discomfort in your arms, back, neck, or jaw. You have shortness of breath at any time. You suddenly start to sweat, or your skin gets clammy. You feel sick to your stomach (nauseous). You throw up (vomit). You suddenly feel lightheaded or dizzy. You feel very weak or tired. Your heart starts to beat fast, or it feels like it is skipping beats. These symptoms may be an emergency. Do not wait to see if the symptoms will go away. Get medical help right away. Call your local emergency services (911 in the [place] Do not drive yourself to the hospital. Summary Chest pain can be caused by many different conditions. The cause may be serious and need treatment right away. If you have chest pain, see your doctor right away. Follow your doctor's instructions for taking medicines and making lifestyle changes. Keep all follow-up visits as told by your doctor. This includes visits for any further  testing if your chest pain does not go away. Be sure to know the signs that show that your condition has become worse. Get help right away if you have these symptoms. This information is not intended to replace advice given to you by your health care provider. Make sure you discuss any questions you have with your health care provider. Document Revised:  11/18/2020 Document Reviewed: 11/18/2020 Elsevier Patient Education  [place].

## 2021-10-06 NOTE — Progress Notes (Signed)
Virtual Visit via Telephone Note  I connected with Kendra [name] on 10/06/21 at 11:20 AM EST by telephone and verified that I am speaking with the correct person using two identifiers.  Location: Patient: Home Provider: Office  Persons participating in this telephone call: Webb Silversmith, NP Dennie Maizes   I discussed the limitations, risks, security and privacy concerns of performing an evaluation and management service by telephone and the availability of in person appointments. I also discussed with the patient that there may be a patient responsible charge related to this service. The patient expressed understanding and agreed to proceed.   History of Present Illness:  Patient reports headache, lightheadedness, nasal congestion, sore throat, cough, chest pain nausea and vomiting.  This started 1 week ago. The headache is located all over. She denies dizziness or syncope.She is not blowing much mucous out of her nose. She denies difficulty swallowing. The cough is productive of yellow mucous. She describes the chest pain as sharp, worsens with exertion but does not radiate to her arm or jaw.  She reports the nausea and vomiting have improved. She denies runny nose, ear pain, shortness of breath or diarrhea. She denies fever but has had chills and body aches. She has  not had sick contacts that she is aware of. She has not taken a home covid test. She does have a history of COPD, managed on Trelegy and Albuterol. She does have a history of DM 2, HTN, HLD with CAD s/p stroke.    Past Medical History:  Diagnosis Date   Asthma    Back pain    Cataract    Cervical disc disorder    COPD (chronic obstructive pulmonary disease) (HCC)    Depression    Diabetes (HCC)    type II   Diabetic neuropathy (HCC)    Dyspnea    with exertion   GERD (gastroesophageal reflux disease)    History of kidney stones    per patient "can't remember the year"   Hyperlipidemia    Hypertension    per  patient "take meds for it"   Insomnia    Neuropathy    Osteoarthritis    Osteopenia    Pneumonia 2018   per patient   Reflux    Rheumatoid arthritis ([place])    Stroke ([place]) 2015   and again 2017  - Weakness in left leg, staggers w/ walking, vision    Tenosynovitis     Current Outpatient Medications  Medication Sig Dispense Refill   ACCU-CHEK GUIDE test strip USE AS DIRECTED. UP TO 4 TIMES DAILY ONCE DAILY 100 each 0   Accu-Chek Softclix Lancets lancets USE AS DIRECTED. UP TO 4 TIMES A DAY 100 each 0   Acetaminophen (TYLENOL 8 HOUR ARTHRITIS PAIN PO) Take 2 capsules by mouth in the morning, at noon, in the evening, and at bedtime.     albuterol (VENTOLIN HFA) 108 (90 Base) MCG/ACT inhaler INHALE 1-2 PUFFS BY MOUTH EVERY 6 HOURS AS NEEDED WHEEZING/ SHORTNESS OF BREATH 8.5 g 1   aspirin EC 81 MG tablet Take 1 tablet (81 mg total) by mouth daily. Swallow whole. 90 tablet 1   atorvastatin (LIPITOR) 40 MG tablet TAKE 1 TABLET BY MOUTH BEDTIME 90 tablet 0   blood glucose meter kit and supplies KIT Dispense based on patient and insurance preference. Use up to four times daily as directed. 1 each 0   Blood Glucose Monitoring Suppl (ONETOUCH VERIO) w/Device KIT USE AS DIRECTED 1 kit  0   buPROPion (WELLBUTRIN XL) 150 MG 24 hr tablet TAKE 1 TABLET BY MOUTH ONCE DAILY 90 tablet 0   clopidogrel (PLAVIX) 75 MG tablet TAKE 1 TABLET BY MOUTH ONCE DAILY 90 tablet 0   cyclobenzaprine (FLEXERIL) 5 MG tablet Take 1 tablet (5 mg total) by mouth 3 (three) times daily as needed for muscle spasms. 30 tablet 2   docusate sodium (COLACE) 100 MG capsule Take 1 capsule (100 mg total) by mouth 2 (two) times daily as needed for mild constipation. 60 capsule 2   Lancet Devices (ONE TOUCH DELICA LANCING DEV) MISC 1 Device by Does not apply route 2 (two) times daily. 200 each 4   metFORMIN (GLUCOPHAGE) 500 MG tablet Take 1 tablet (500 mg total) by mouth 2 (two) times daily with a meal. 60 tablet 2   naproxen (NAPROSYN)  375 MG tablet Take 1 tablet (375 mg total) by mouth 2 (two) times daily with a meal. 14 tablet 0   traZODone (DESYREL) 50 MG tablet Take 0.5-1 tablets (25-50 mg total) by mouth at bedtime as needed for sleep. 90 tablet 0   TRELEGY ELLIPTA 100-62.5-25 MCG/ACT AEPB USE ONE INHALATION INTO THE LUNGS ONCE A DAY RINSE MOUTH AFTER EACH USE 60 each 1   No current facility-administered medications for this visit.    Allergies  Allergen Reactions   Citalopram Nausea And Vomiting    Family History  Problem Relation Age of Onset   Stroke Father    Depression Father    Diabetes Mother    Hyperlipidemia Mother    Breast cancer Mother    Heart murmur Son    Heart murmur Son    Heart murmur Son     Social History   Socioeconomic History   Marital status: Widowed    Spouse name: Not on file   Number of children: Not on file   Years of education: Not on file   Highest education level: Not on file  Occupational History   Not on file  Tobacco Use   Smoking status: Some Days    Packs/day: 0.25    Years: 61.00    Pack years: 15.25    Types: Cigarettes   Smokeless tobacco: Former    Types: Snuff  Vaping Use   Vaping Use: Never used  Substance and Sexual Activity   Alcohol use: No   Drug use: No   Sexual activity: Never  Other Topics Concern   Not on file  Social History Narrative   Not on file   Social Determinants of Health   Financial Resource Strain: Not on file  Food Insecurity: Not on file  Transportation Needs: Not on file  Physical Activity: Not on file  Stress: Not on file  Social Connections: Not on file  Intimate Partner Violence: Not on file     Constitutional: Patient reports fatigue, malaise, headaches.  Denies fever or abrupt weight changes.  HEENT: Patient reports nasal congestion and sore throat.  Denies eye pain, eye redness, ear pain, ringing in the ears, wax buildup, runny nose, bloody nose. Respiratory: Patient reports cough.  Denies difficulty  breathing, shortness of breath.   Cardiovascular: Patient reports chest pain.  Denies  chest tightness, palpitations or swelling in the hands or feet.  Gastrointestinal: Patient reports nausea and vomiting.  Denies abdominal pain, bloating, constipation, diarrhea or blood in the stool.  Skin: Denies redness, rashes, lesions or ulcercations.  Neurological: Patient reports lightheadedness.  Denies dizziness, difficulty with memory, difficulty with  speech or problems with balance and coordination.    No other specific complaints in a complete review of systems (except as listed in HPI above).  Observations/Objective: There were no vitals taken for this visit. Wt Readings from Last 3 Encounters:  08/01/21 130 lb 3.2 oz (59.1 kg)  06/28/21 129 lb 8 oz (58.7 kg)  06/20/21 131 lb (59.4 kg)    General:  In NAD. HEENT: Nose: Congestion noted; Throat/Mouth: Hoarseness noted Pulmonary/Chest: Increased effort.  No audible wheezing.  No respiratory distress noted. Neurological: Alert and oriented.   BMET    Component Value Date/Time   NA 139 08/01/2021 1021   NA 141 01/18/2016 1150   NA 138 09/01/2014 2130   K 4.5 08/01/2021 1021   K 3.5 09/01/2014 2130   CL 103 08/01/2021 1021   CL 104 09/01/2014 2130   CO2 28 08/01/2021 1021   CO2 28 09/01/2014 2130   GLUCOSE 135 (H) 08/01/2021 1021   GLUCOSE 287 (H) 09/01/2014 2130   BUN 21 08/01/2021 1021   BUN 12 01/18/2016 1150   BUN 14 09/01/2014 2130   CREATININE 0.85 08/01/2021 1021   CALCIUM 9.8 08/01/2021 1021   CALCIUM 8.9 09/01/2014 2130   GFRNONAA >60 06/06/2021 1314   GFRNONAA 70 06/14/2020 0929   GFRAA 82 06/14/2020 0929    Lipid Panel     Component Value Date/Time   CHOL 135 08/01/2021 1021   CHOL 177 06/07/2015 1215   CHOL 141 09/02/2014 0440   TRIG 89 08/01/2021 1021   TRIG 155 09/02/2014 0440   HDL 65 08/01/2021 1021   HDL 56 06/07/2015 1215   HDL 37 (L) 09/02/2014 0440   CHOLHDL 2.1 08/01/2021 1021   VLDL 16  10/17/2019 0606   VLDL 31 09/02/2014 0440   LDLCALC 53 08/01/2021 1021   LDLCALC 73 09/02/2014 0440    CBC    Component Value Date/Time   WBC 4.9 06/06/2021 1314   RBC 3.66 (L) 06/06/2021 1314   HGB 11.6 (L) 06/06/2021 1314   HGB 12.7 09/01/2014 2130   HCT 35.7 (L) 06/06/2021 1314   HCT 37.9 09/01/2014 2130   PLT 228 06/06/2021 1314   PLT 260 09/05/2014 0517   MCV 97.5 06/06/2021 1314   MCV 92 09/01/2014 2130   MCH 31.7 06/06/2021 1314   MCHC 32.5 06/06/2021 1314   RDW 12.8 06/06/2021 1314   RDW 13.9 09/01/2014 2130   LYMPHSABS 1.5 06/06/2021 1314   MONOABS 0.3 06/06/2021 1314   EOSABS 0.1 06/06/2021 1314   BASOSABS 0.0 06/06/2021 1314    Hgb A1C Lab Results  Component Value Date   HGBA1C 7.7 (H) 08/01/2021        Assessment and Plan:  Acute Headache, Lightheadedness, Nasal Congestion, Sore Throat, Cough, Chest Pain, Nausea, Vomiting, Chills and Body Aches:  Given the fact that she is stating that she has chest pain that is worse with exertion, would recommend she call EMS for transportation to the hospital for further evaluation and treatment We will follow-up with patient after ER evaluation   Follow Up Instructions:    I discussed the assessment and treatment plan with the patient. The patient was provided an opportunity to ask questions and all were answered. The patient agreed with the plan and demonstrated an understanding of the instructions.   The patient was advised to call back or seek an in-person evaluation if the symptoms worsen or if the condition fails to improve as anticipated.  I provided 11:20 minutes of  non-face-to-face time during this encounter.   Webb Silversmith, NP

## 2021-10-06 NOTE — ED Notes (Signed)
Pt given cab voucher.  

## 2021-10-06 NOTE — Telephone Encounter (Signed)
The pt informed me that she was only getting (1) metformin in her pill pack and not two as prescribed. She also informed me that she was out of her Trazodone as well as the Metformin. I called the pharmacy and they informed me that she is getting two metformin in her pill pack. She also clarified that the patient received 90 day script of her Trazodone on 07/18/2021.

## 2021-10-06 NOTE — ED Provider Notes (Signed)
[facility] Provider Note    Event Date/Time   First MD Initiated Contact with Patient 10/06/21 1358     (approximate)   History   Chest Pain   HPI  Kendra [name] is a 76 y.o. female with a past medical history of asthma, COPD, ongoing tobacco abuse, DM, GERD, HTN, HDL, neuropathy, reflux, RA and CVA who presents for assessment of approximately 1 week of increased cough from baseline associate with congestion and chest pain last night.  Patient states he took some aspirin earlier this morning and her chest pain went away.  Has not had any new shortness of breath, fevers, back pain, headache, earache, sore throat, vomiting, diarrhea, urinary symptoms, rash or extremity pain.  She denies any known cardiac history.  No other acute concerns at this time.      Physical Exam  Triage Vital Signs: ED Triage Vitals  Enc Vitals Group     BP 10/06/21 1242 95/72     Pulse Rate 10/06/21 1242 (!) 101     Resp 10/06/21 1242 18     Temp 10/06/21 1242 98 F (36.7 C)     Temp Source 10/06/21 1242 Oral     SpO2 10/06/21 1242 97 %     Weight --      Height --      Head Circumference --      Peak Flow --      Pain Score 10/06/21 1235 7     Pain Loc --      Pain Edu? --      Excl. in [place]? --     Most recent vital signs: Vitals:   10/06/21 1530 10/06/21 1600  BP: 124/74 121/73  Pulse:  85  Resp:    Temp:    SpO2:  100%    General: Awake, no distress.  CV:  Good peripheral perfusion.  Very slight systolic murmur.  2+ radial pulses. Resp:  Normal effort.  Clear bilaterally.  No wheezing or tachypnea or increased effort. Abd:  No distention.  Soft throughout. Other:  No significant lower extremity edema.    ED Results / Procedures / Treatments  Labs (all labs ordered are listed, but only abnormal results are displayed) Labs Reviewed  BASIC METABOLIC PANEL - Abnormal; Notable for the following components:      Result Value   Glucose, Bld 139 (*)     All other components within normal limits  CBC - Abnormal; Notable for the following components:   RBC 3.53 (*)    Hemoglobin 11.4 (*)    HCT 33.5 (*)    All other components within normal limits  D-DIMER, QUANTITATIVE - Abnormal; Notable for the following components:   D-Dimer, Quant 0.66 (*)    All other components within normal limits  RESP PANEL BY RT-PCR (FLU A&B, COVID) ARPGX2  TROPONIN I (HIGH SENSITIVITY)  TROPONIN I (HIGH SENSITIVITY)     EKG  EKG reviewed by myself is remarkable for sinus tachycardia with a ventricular rate of 101, Q wave in V2 and left axis deviation without any other evidence of acute ischemia or significant arrhythmia.   RADIOLOGY  Chest x-ray reviewed by myself shows no focal consolidation, effusion, edema, pneumothorax or any other clear acute thoracic process.  Also reviewed radiology interpretation and agree with their findings.   PROCEDURES:  Critical Care performed: No  Procedures {If the patient is on the monitor, remove the brackets and asterisks.   MEDICATIONS ORDERED IN  ED: Medications  acetaminophen (TYLENOL) tablet 1,000 mg (1,000 mg Oral Given 10/06/21 1434)     IMPRESSION / MDM / ASSESSMENT AND PLAN / ED COURSE  I reviewed the triage vital signs and the nursing notes.                              Differential diagnosis includes, but is not limited to, pneumonia, bronchitis, costochondritis, ACS, PE, pericarditis, myocarditis and GERD.  EKG reviewed by myself is remarkable for sinus tachycardia with a ventricular rate of 101, Q wave in V2 and left axis deviation without any other evidence of acute ischemia or significant arrhythmia.  Given troponins nonelevated x2 Evalose suspicion for ACS or myocarditis.  D-dimer of 0.66 is within age-adjusted limits and thus I have a low suspicion for PE especially as patient is denying any significant shortness of breath and is not hypoxic tachypneic or tachycardic.  COVID and influenza PCR is  negative.  Chest x-ray reviewed by myself shows no focal consolidation, effusion, edema, pneumothorax or any other clear acute thoracic process.  Also reviewed radiology interpretation and agree with their findings.  CBC without leukocytosis or acute anemia.  BMP without any significant electrode or metabolic derangements. \ Given patient's age and comorbidities I considered admission for observation although I think given stable vitals otherwise reassuring exam work-up and low suspicion for immediately life-threatening pathology I think she is stable for discharge with close outpatient follow-up.  I suspect a component of chest wall inflammation in the setting of bronchitis.  She states she is chest pain-free on my reassessment.  Discharged in stable condition.  Strict return precautions advised and discussed.  Counseled on tobacco cessation.       FINAL CLINICAL IMPRESSION(S) / ED DIAGNOSES   Final diagnoses:  Chest pain, unspecified type  Bronchitis  Tobacco abuse     Rx / DC Orders   ED Discharge Orders     None        Note:  This document was prepared using Dragon voice recognition software and may include unintentional dictation errors.   Lucrezia Starch, MD 10/06/21 [phone]

## 2021-10-06 NOTE — ED Triage Notes (Signed)
Pt comes into the ED via EMS from home with c/o sick with a cough for the past week , chest pain since last night, 7/10 sharp pain, hx of anxiety with similar sx  97HR 99%RA 115/76

## 2021-10-07 ENCOUNTER — Other Ambulatory Visit: Payer: Self-pay | Admitting: Internal Medicine

## 2021-10-07 NOTE — Telephone Encounter (Signed)
Requested Prescriptions  Pending Prescriptions Disp Refills   Accu-Chek Softclix Lancets lancets [Pharmacy Med Name: ACCU-CHEK SOFTCLIX LANCETS] 400 each 1    Sig: USE AS DIRECTED. UP TO 4 TIMES A DAY     Endocrinology: Diabetes - Testing Supplies Passed - 10/07/2021  5:46 PM      Passed - Valid encounter within last 12 months    Recent Outpatient Visits          Yesterday Chest pain, unspecified type   [facility] [place], Coralie Keens, NP   2 months ago Controlled type 2 diabetes mellitus with complication, without long-term current use of insulin [facility])   [facility] [place], Coralie Keens, NP   3 months ago Chronic right shoulder pain   [facility] [place], Coralie Keens, NP   4 months ago Aortic atherosclerosis [facility])   [facility], NP   7 months ago Closed fracture of multiple ribs of left side, initial encounter   [name] [place], Coralie Keens, NP      Future Appointments            In 1 month [name], Coralie Keens, NP [place] test strip [place] Med Name: ACCU-CHEK GUIDE STRIP] 400 each 1    Sig: USE AS DIRECTED. UP TO 4 TIMES DAILY ONCE DAILY     Endocrinology: Diabetes - Testing Supplies Passed - 10/07/2021  5:46 PM      Passed - Valid encounter within last 12 months    Recent Outpatient Visits          Yesterday Chest pain, unspecified type   [facility] [place], Coralie Keens, NP   2 months ago Controlled type 2 diabetes mellitus with complication, without long-term current use of insulin [name])   [facility] [place], Coralie Keens, NP   3 months ago Chronic right shoulder pain   [facility] [place], Coralie Keens, NP   4 months ago Aortic atherosclerosis [facility])   [facility], NP   7 months ago Closed fracture of multiple ribs of left side, initial encounter   [facility], Coralie Keens, NP      Future Appointments            In 1 month [name], Coralie Keens, NP [facility], [facility]

## 2021-10-10 ENCOUNTER — Telehealth: Payer: Medicare Other

## 2021-10-10 ENCOUNTER — Ambulatory Visit (INDEPENDENT_AMBULATORY_CARE_PROVIDER_SITE_OTHER): Payer: 59

## 2021-10-10 DIAGNOSIS — R051 Acute cough: Secondary | ICD-10-CM

## 2021-10-10 DIAGNOSIS — F3341 Major depressive disorder, recurrent, in partial remission: Secondary | ICD-10-CM

## 2021-10-10 DIAGNOSIS — E1169 Type 2 diabetes mellitus with other specified complication: Secondary | ICD-10-CM

## 2021-10-10 DIAGNOSIS — G8929 Other chronic pain: Secondary | ICD-10-CM

## 2021-10-10 DIAGNOSIS — E118 Type 2 diabetes mellitus with unspecified complications: Secondary | ICD-10-CM

## 2021-10-10 DIAGNOSIS — E1159 Type 2 diabetes mellitus with other circulatory complications: Secondary | ICD-10-CM

## 2021-10-10 DIAGNOSIS — I152 Hypertension secondary to endocrine disorders: Secondary | ICD-10-CM

## 2021-10-10 DIAGNOSIS — R0981 Nasal congestion: Secondary | ICD-10-CM

## 2021-10-10 DIAGNOSIS — J410 Simple chronic bronchitis: Secondary | ICD-10-CM

## 2021-10-10 DIAGNOSIS — E785 Hyperlipidemia, unspecified: Secondary | ICD-10-CM

## 2021-10-10 NOTE — Chronic Care Management (AMB) (Signed)
Chronic Care Management   CCM RN Visit Note  10/10/2021 Name: Kendra [name] MRN: [id number] DOB: 06/23/1946  Subjective: Kendra [name] is a 76 y.o. year old female who is a primary care patient of [name], NP. The care management team was consulted for assistance with disease management and care coordination needs.    Engaged with patient by telephone for follow up visit in response to provider referral for case management and/or care coordination services.   Consent to Services:  The patient was given information about Chronic Care Management services, agreed to services, and gave verbal consent prior to initiation of services.  Please see initial visit note for detailed documentation.   Patient agreed to services and verbal consent obtained.   Assessment: Review of patient past medical history, allergies, medications, health status, including review of consultants reports, laboratory and other test data, was performed as part of comprehensive evaluation and provision of chronic care management services.   SDOH (Social Determinants of Health) assessments and interventions performed:    CCM Care Plan  Allergies  Allergen Reactions   Citalopram Nausea And Vomiting    Outpatient Encounter Medications as of 10/10/2021  Medication Sig   ACCU-CHEK GUIDE test strip USE AS DIRECTED. UP TO 4 TIMES DAILY ONCE DAILY   Accu-Chek Softclix Lancets lancets USE AS DIRECTED. UP TO 4 TIMES A DAY   Acetaminophen (TYLENOL 8 HOUR ARTHRITIS PAIN PO) Take 2 capsules by mouth in the morning, at noon, in the evening, and at bedtime.   albuterol (VENTOLIN HFA) 108 (90 Base) MCG/ACT inhaler INHALE 1-2 PUFFS BY MOUTH EVERY 6 HOURS AS NEEDED WHEEZING/ SHORTNESS OF BREATH   aspirin EC 81 MG tablet Take 1 tablet (81 mg total) by mouth daily. Swallow whole.   atorvastatin (LIPITOR) 40 MG tablet TAKE 1 TABLET BY MOUTH BEDTIME   blood glucose meter kit and supplies KIT Dispense based on patient and  insurance preference. Use up to four times daily as directed.   Blood Glucose Monitoring Suppl (ONETOUCH VERIO) w/Device KIT USE AS DIRECTED   buPROPion (WELLBUTRIN XL) 150 MG 24 hr tablet TAKE 1 TABLET BY MOUTH ONCE DAILY   clopidogrel (PLAVIX) 75 MG tablet TAKE 1 TABLET BY MOUTH ONCE DAILY   cyclobenzaprine (FLEXERIL) 5 MG tablet Take 1 tablet (5 mg total) by mouth 3 (three) times daily as needed for muscle spasms.   docusate sodium (COLACE) 100 MG capsule Take 1 capsule (100 mg total) by mouth 2 (two) times daily as needed for mild constipation.   Lancet Devices (ONE TOUCH DELICA LANCING DEV) MISC 1 Device by Does not apply route 2 (two) times daily.   metFORMIN (GLUCOPHAGE) 500 MG tablet Take 1 tablet (500 mg total) by mouth 2 (two) times daily with a meal. (Patient taking differently: Take 500 mg by mouth daily with breakfast.)   naproxen (NAPROSYN) 375 MG tablet Take 1 tablet (375 mg total) by mouth 2 (two) times daily with a meal.   traZODone (DESYREL) 50 MG tablet Take 0.5-1 tablets (25-50 mg total) by mouth at bedtime as needed for sleep.   TRELEGY ELLIPTA 100-62.5-25 MCG/ACT AEPB USE ONE INHALATION INTO THE LUNGS ONCE A DAY RINSE MOUTH AFTER EACH USE   No facility-administered encounter medications on file as of 10/10/2021.    Patient Active Problem List   Diagnosis Date Noted   Aortic atherosclerosis ([place]) 05/31/2021   COPD (chronic obstructive pulmonary disease) ([place]) 05/31/2021   Anemia 05/31/2021   Hemiparesis affecting left side  as late effect of stroke ([place]) 02/02/2021   Recurrent major depressive disorder, in partial remission ([place]) 02/02/2021   Carotid artery stenosis 12/30/2019   Hypertension associated with diabetes ([place]) 12/19/2018   Diabetes mellitus type 2, controlled, with complications ([place]) 35/59/7416   Insomnia 03/30/2015   Hyperlipidemia associated with type 2 diabetes mellitus ([place]) 03/30/2015   Gastro-esophageal reflux disease without esophagitis 03/30/2015    Osteoarthritis 03/30/2015   Osteopenia 03/30/2015   B12 deficiency 03/30/2015    Conditions to be addressed/monitored:HTN, HLD, COPD, DMII, Depression, Osteoarthritis, and "Bronchitis  Care Plan : RNCM: General Plan of Care (Adult) for Chronic Disease Management and Care Coordination Needs  Updates made by Vanita Ingles, RN since 10/10/2021 12:00 AM     Problem: RNCM: Development of Plan of Care For Chronic Disease Management (HTN, HLD, DM, OA, COPD, Depression)   Priority: High     Long-Range Goal: RNCM: Effective Management  of Plan of Care For Chronic Disease Management (HTN, HLD, DM, OA, COPD, Depression)   Start Date: 08/08/2021  Expected End Date: 08/08/2022  Priority: High  Note:   Current Barriers:  Knowledge Deficits related to plan of care for management of HTN, HLD, COPD, DMII, Depression: depressed mood anxiety, and chronic pain with OA to right shoulder  Care Coordination needs related to Limited social support, Mental Health Concerns , and Literacy concerns  Chronic Disease Management support and education needs related to HTN, HLD, COPD, DMII, Depression: depressed mood anxiety, and Chronic Pain/OA Lacks caregiver support.         RNCM Clinical Goal(s):  Patient will verbalize basic understanding of HTN, HLD, COPD, DMII, Depression, and Osteoarthritis disease process and self health management plan as evidenced by following plan of care, keeping appointments, calling the provider office for changes in conditions and working with the CCM team to optimize health and well being  take all medications exactly as prescribed and will call provider for medication related questions as evidenced by compliance with medications and calling the office for refill needs     attend all scheduled medical appointments: pcp appointment on 10-11-2021 at 4 pm as evidenced by keeping appointments and calling the office for needed schedule changes        continue to work with [place]  and/or Social Worker to address care management and care coordination needs related to HTN, HLD, COPD, DMII, Depression, and Osteoarthritis as evidenced by adherence to CM Team Scheduled appointments     demonstrate a decrease in HTN, HLD, COPD, DMII, Depression, and Osteoarthritis exacerbations  as evidenced by following the plan of care and calling for any changes or acute exacerbations in chronic conditions  demonstrate ongoing self health care management ability for effective management of Chronic conditions  as evidenced by  working with the CCM team through collaboration with Consulting civil engineer, provider, and care team.   Interventions: 1:1 collaboration with primary care provider regarding development and update of comprehensive plan of care as evidenced by provider attestation and co-signature Inter-disciplinary care team collaboration (see longitudinal plan of care) Evaluation of current treatment plan related to  self management and patient's adherence to plan as established by provider   COPD: (Status: Goal on Track (progressing): YES.) Long Term Goal  Reviewed medications with patient, including use of prescribed maintenance and rescue inhalers, and provided instruction on medication management and the importance of adherence. 10-10-2021: The patient is compliant with medications.  Provided patient with basic written and verbal COPD education on self care/management/and  exacerbation prevention. 10-10-2021: The patient was in the ER for evaluation of chest pain on 10-06-2021. The patient states that they told her she had "bronchitis" but did not give her any medications. The patient states she has a runny nose and the drainage is clear. Denies fever or chills, education and appointment secured for the patient to talk to pcp on 10-11-2021 via phone at 4 pm. Review and confirmed with the patient.  Advised patient to track and manage COPD triggers. 10-10-2021: Review with the patient and education  provider on factors that can trigger COPD exacerbation.  Provided written and verbal instructions on pursed lip breathing and utilized returned demonstration as teach back Provided instruction about proper use of medications used for management of COPD including inhalers Advised patient to self assesses COPD action plan zone and make appointment with provider if in the yellow zone for 48 hours without improvement Advised patient to engage in light exercise as tolerated 3-5 days a week to aid in the the management of COPD Provided education about and advised patient to utilize infection prevention strategies to reduce risk of respiratory infection Discussed the importance of adequate rest and management of fatigue with COPD. 10-11-2021: Review and the patient is staying safe and denies any acute findings.  Screening for signs and symptoms of depression related to chronic disease state  Assessed social determinant of health barriers  Diabetes:  (Status: Goal on Track (progressing): YES.) Long Term Goal   Lab Results  Component Value Date   HGBA1C 7.7 (H) 08/01/2021  Assessed patient's understanding of A1c goal: <7%. 08-08-2021: States her A1C was elevated this time. Review of steroid shots may have caused some of the reason for elevation. The patient states she did not have her paper with her and is having to write down her numbers. Will mail the patient more sheets to record blood pressures and blood sugars. Provided education to patient about basic DM disease process; Reviewed medications with patient and discussed importance of medication adherence. 08-08-2021: The patient has started back on Metformin 500 mg BID, endorses compliance. Denies any issues.  10-10-2021: The patient is compliant with medications, denies any acute findings.     Reviewed prescribed diet with patient heart healthy/ADA- 10-10-2021: The patient is compliant with heart healthy/ADA diet ; Counseled on importance of regular  laboratory monitoring as prescribed. 10-10-2021: The patient is compliant with getting regular lab work for evaluation;        Discussed plans with patient for ongoing care management follow up and provided patient with direct contact information for care management team;      Provided patient with written educational materials related to hypo and hyperglycemia and importance of correct treatment. 10-10-2021: Denies any lows but states she knows how to monitor and treat       Reviewed scheduled/upcoming provider appointments including: 10-11-2021 at 4 pm with pcp, sees specialist on 10-14-2021.    Advised patient, providing education and rationale, to check cbg before meals and at bedtime, when you have symptoms of low or high blood sugar, and before and after exercise and record. 10-10-2021: The patient takes blood sugars and records. Denies any acute findings.      call provider for findings outside established parameters;       Review of patient status, including review of consultants reports, relevant laboratory and other test results, and medications completed;        Depression  (Status: Goal on Track (progressing): YES.) Long Term Goal  Evaluation of  current treatment plan related to Depression, Mental Health Concerns  self-management and patient's adherence to plan as established by provider. 10-10-2021: The patient denies any acute distress. She is not feeling the best right now due to her recent visit to the ER and diagnosis of bronchitis. She also has appointments coming up to have her rotator cuff evaluated. The patient states that she is just wanting to feel better. Denies any mental health needs at this time.  Discussed plans with patient for ongoing care management follow up and provided patient with direct contact information for care management team Advised patient to call the office for changes in mood, anxiety, or depression ; Provided education to patient re: staying positive, being around  positive people, not being around negativity, pacing activity and working with the CCM team to effectively manage mental health and well being; Reviewed medications with patient and discussed compliance ; Discussed plans with patient for ongoing care management follow up and provided patient with direct contact information for care management team; Screening for signs and symptoms of depression related to chronic disease state;  Assessed social determinant of health barriers;   Hyperlipidemia:  (Status: Goal on Track (progressing): YES.) Long Term Goal  Lab Results  Component Value Date   CHOL 135 08/01/2021   HDL 65 08/01/2021   LDLCALC 53 08/01/2021   TRIG 89 08/01/2021   CHOLHDL 2.1 08/01/2021     Medication review performed; medication list updated in electronic medical record. 10-10-2021: Is compliant with medications regimen Provider established cholesterol goals reviewed. 10-10-2021: The patient is at goal and doing well with cholesterol management Counseled on importance of regular laboratory monitoring as prescribed; Provided HLD educational materials; Reviewed role and benefits of statin for ASCVD risk reduction; Discussed strategies to manage statin-induced myalgias; Reviewed importance of limiting foods high in cholesterol;  Hypertension: (Status: Goal on Track (progressing): YES.) Last practice recorded BP readings:  BP Readings from Last 3 Encounters:  10/06/21 121/73  08/07/21 115/84  08/01/21 109/65  Most recent eGFR/CrCl:  Lab Results  Component Value Date   EGFR 72 08/01/2021    No components found for: CRCL  Evaluation of current treatment plan related to hypertension self management and patient's adherence to plan as established by provider. 10-10-2021: The patient is having normal blood pressures and denies any issues with HTN or heart health. Will continue to monitor for changes  Provided education to patient re: stroke prevention, s/s of heart attack and  stroke; Reviewed prescribed diet heart healthy/ADA diet. 10-10-2021: Review and education done.  Reviewed medications with patient and discussed importance of compliance;  Discussed plans with patient for ongoing care management follow up and provided patient with direct contact information for care management team; Advised patient, providing education and rationale, to monitor blood pressure daily and record, calling PCP for findings outside established parameters;  Advised patient to discuss blood pressure trends  with provider; Provided education on prescribed diet heart healthy/ADA ;  Discussed complications of poorly controlled blood pressure such as heart disease, stroke, circulatory complications, vision complications, kidney impairment, sexual dysfunction;   Pain:  (Status: Goal on Track (progressing): YES.) Long Term Goal  Pain assessment performed. 08-08-2021: The patient rates her pain level at a "zero" today. She states most of the time she has pain at night that wakes her from her sleep. She says the medications help her. She follows up with the specialist on 08-16-2021. Advised the patient to discuss with the specialist when she is having pain. 10-10-2021:  Denies any pain today. Sees specialist on 10-14-2021 for evaluation of rotator cuff Medications reviewed- 10-10-2021: Is compliant with medications Reviewed provider established plan for pain management; Discussed importance of adherence to all scheduled medical appointments. 10-11-2021 with pcp, 10-14-2021 with the specialist  Counseled on the importance of reporting any/all new or changed pain symptoms or management strategies to pain management provider; Advised patient to report to care team affect of pain on daily activities; Discussed use of relaxation techniques and/or diversional activities to assist with pain reduction (distraction, imagery, relaxation, massage, acupressure, TENS, heat, and cold application. 08-08-2021: Education  on using heat application to help with pain relief. Reviewed with patient prescribed pharmacological and nonpharmacological pain relief strategies; Advised patient to discuss unrelieved pain or changes in level of intensity of pain  with provider; 08-08-2021: The patient states she needs a new BSC because the leg of  hers broke. Will collaborate with the pcp about getting the patient a new chair. 10-10-2021: Has DME  Patient Goals/Self-Care Activities: Take medications as prescribed   Attend all scheduled provider appointments Call pharmacy for medication refills 3-7 days in advance of running out of medications Attend church or other social activities Perform all self care activities independently  Perform IADL's (shopping, preparing meals, housekeeping, managing finances) independently Call provider office for new concerns or questions  Work with the social worker to address care coordination needs and will continue to work with the clinical team to address health care and disease management related needs call 911 call the Suicide and Crisis Lifeline: 988 call the [place] National Suicide Prevention Lifeline: 914 076 0058 call 1-800-273-TALK (toll free, 24 hour hotline) if experiencing a Mental Health or [place]  schedule appointment with eye doctor check blood sugar at prescribed times: when you have symptoms of low or high blood sugar, before and after exercise, and as directed   check feet daily for cuts, sores or redness enter blood sugar readings and medication or insulin into daily log take the blood sugar log to all doctor visits trim toenails straight across drink 6 to 8 glasses of water each day eat fish at least once per week fill half of plate with vegetables keep a food diary limit fast food meals to no more than 1 per week manage portion size prepare main meal at home 3 to 5 days each week read food labels for fat, fiber, carbohydrates and portion  size keep feet up while sitting wash and dry feet carefully every day wear comfortable, cotton socks wear comfortable, well-fitting shoes - avoid second hand smoke - eliminate smoking in my home - identify and remove indoor air pollutants - limit outdoor activity during cold weather - listen for public air quality announcements every day - do breathing exercises every day - develop a rescue plan - eliminate symptom triggers at home - follow rescue plan if symptoms flare-up - use an extra pillow to sleep - develop a new routine to improve sleep - don't eat or exercise right before bedtime - get at least 7 to 8 hours of sleep at night - use devices that will help like a cane, sock-puller or reacher - practice relaxation or meditation daily - do exercises in a comfortable position that makes breathing as easy as possible check blood pressure daily choose a place to take my blood pressure (home, clinic or office, retail store) write blood pressure results in a log or diary learn about high blood pressure keep a blood pressure log take blood pressure  log to all doctor appointments call doctor for signs and symptoms of high blood pressure develop an action plan for high blood pressure keep all doctor appointments take medications for blood pressure exactly as prescribed report new symptoms to your doctor eat more whole grains, fruits and vegetables, lean meats and healthy fats - call for medicine refill 2 or 3 days before it runs out - take all medications exactly as prescribed - call doctor with any symptoms you believe are related to your medicine - call doctor when you experience any new symptoms - go to all doctor appointments as scheduled - adhere to prescribed diet: heart healthy/ADA diet        Plan:Telephone follow up appointment with care management team member scheduled for:  11-14-2021 at 30 am  [place], MSN, [place] Mobile: [phone]

## 2021-10-10 NOTE — Patient Instructions (Signed)
Visit Information  Thank you for taking time to visit with me today. Please don't hesitate to contact me if I can be of assistance to you before our next scheduled telephone appointment.  Following are the goals we discussed today:  RNCM Clinical Goal(s):  Patient will verbalize basic understanding of HTN, HLD, COPD, DMII, Depression, and Osteoarthritis disease process and self health management plan as evidenced by following plan of care, keeping appointments, calling the provider office for changes in conditions and working with the CCM team to optimize health and well being  take all medications exactly as prescribed and will call provider for medication related questions as evidenced by compliance with medications and calling the office for refill needs     attend all scheduled medical appointments: pcp appointment on 10-11-2021 at 4 pm as evidenced by keeping appointments and calling the office for needed schedule changes        continue to work with RN Care Manager and/or Social Worker to address care management and care coordination needs related to HTN, HLD, COPD, DMII, Depression, and Osteoarthritis as evidenced by adherence to CM Team Scheduled appointments     demonstrate a decrease in HTN, HLD, COPD, DMII, Depression, and Osteoarthritis exacerbations  as evidenced by following the plan of care and calling for any changes or acute exacerbations in chronic conditions  demonstrate ongoing self health care management ability for effective management of Chronic conditions  as evidenced by  working with the CCM team through collaboration with Consulting civil engineer, provider, and care team.    Interventions: 1:1 collaboration with primary care provider regarding development and update of comprehensive plan of care as evidenced by provider attestation and co-signature Inter-disciplinary care team collaboration (see longitudinal plan of care) Evaluation of current treatment plan related to  self  management and patient's adherence to plan as established by provider     COPD: (Status: Goal on Track (progressing): YES.) Long Term Goal  Reviewed medications with patient, including use of prescribed maintenance and rescue inhalers, and provided instruction on medication management and the importance of adherence. 10-10-2021: The patient is compliant with medications.  Provided patient with basic written and verbal COPD education on self care/management/and exacerbation prevention. 10-10-2021: The patient was in the ER for evaluation of chest pain on 10-06-2021. The patient states that they told her she had "bronchitis" but did not give her any medications. The patient states she has a runny nose and the drainage is clear. Denies fever or chills, education and appointment secured for the patient to talk to pcp on 10-11-2021 via phone at 4 pm. Review and confirmed with the patient.  Advised patient to track and manage COPD triggers. 10-10-2021: Review with the patient and education provider on factors that can trigger COPD exacerbation.  Provided written and verbal instructions on pursed lip breathing and utilized returned demonstration as teach back Provided instruction about proper use of medications used for management of COPD including inhalers Advised patient to self assesses COPD action plan zone and make appointment with provider if in the yellow zone for 48 hours without improvement Advised patient to engage in light exercise as tolerated 3-5 days a week to aid in the the management of COPD Provided education about and advised patient to utilize infection prevention strategies to reduce risk of respiratory infection Discussed the importance of adequate rest and management of fatigue with COPD. 10-11-2021: Review and the patient is staying safe and denies any acute findings.  Screening for signs and symptoms  of depression related to chronic disease state  Assessed social determinant of health  barriers   Diabetes:  (Status: Goal on Track (progressing): YES.) Long Term Goal         Lab Results  Component Value Date    HGBA1C 7.7 (H) 08/01/2021  Assessed patient's understanding of A1c goal: <7%. 08-08-2021: States her A1C was elevated this time. Review of steroid shots may have caused some of the reason for elevation. The patient states she did not have her paper with her and is having to write down her numbers. Will mail the patient more sheets to record blood pressures and blood sugars. Provided education to patient about basic DM disease process; Reviewed medications with patient and discussed importance of medication adherence. 08-08-2021: The patient has started back on Metformin 500 mg BID, endorses compliance. Denies any issues.  10-10-2021: The patient is compliant with medications, denies any acute findings.     Reviewed prescribed diet with patient heart healthy/ADA- 10-10-2021: The patient is compliant with heart healthy/ADA diet ; Counseled on importance of regular laboratory monitoring as prescribed. 10-10-2021: The patient is compliant with getting regular lab work for evaluation;        Discussed plans with patient for ongoing care management follow up and provided patient with direct contact information for care management team;      Provided patient with written educational materials related to hypo and hyperglycemia and importance of correct treatment. 10-10-2021: Denies any lows but states she knows how to monitor and treat       Reviewed scheduled/upcoming provider appointments including: 10-11-2021 at 4 pm with pcp, sees specialist on 10-14-2021.    Advised patient, providing education and rationale, to check cbg before meals and at bedtime, when you have symptoms of low or high blood sugar, and before and after exercise and record. 10-10-2021: The patient takes blood sugars and records. Denies any acute findings.      call provider for findings outside established parameters;        Review of patient status, including review of consultants reports, relevant laboratory and other test results, and medications completed;         Depression  (Status: Goal on Track (progressing): YES.) Long Term Goal  Evaluation of current treatment plan related to Depression, Mental Health Concerns  self-management and patient's adherence to plan as established by provider. 10-10-2021: The patient denies any acute distress. She is not feeling the best right now due to her recent visit to the ER and diagnosis of bronchitis. She also has appointments coming up to have her rotator cuff evaluated. The patient states that she is just wanting to feel better. Denies any mental health needs at this time.  Discussed plans with patient for ongoing care management follow up and provided patient with direct contact information for care management team Advised patient to call the office for changes in mood, anxiety, or depression ; Provided education to patient re: staying positive, being around positive people, not being around negativity, pacing activity and working with the CCM team to effectively manage mental health and well being; Reviewed medications with patient and discussed compliance ; Discussed plans with patient for ongoing care management follow up and provided patient with direct contact information for care management team; Screening for signs and symptoms of depression related to chronic disease state;  Assessed social determinant of health barriers;    Hyperlipidemia:  (Status: Goal on Track (progressing): YES.) Long Term Goal  Lab Results  Component Value Date    CHOL 135 08/01/2021    HDL 65 08/01/2021    LDLCALC 53 08/01/2021    TRIG 89 08/01/2021    CHOLHDL 2.1 08/01/2021      Medication review performed; medication list updated in electronic medical record. 10-10-2021: Is compliant with medications regimen Provider established cholesterol goals reviewed. 10-10-2021: The  patient is at goal and doing well with cholesterol management Counseled on importance of regular laboratory monitoring as prescribed; Provided HLD educational materials; Reviewed role and benefits of statin for ASCVD risk reduction; Discussed strategies to manage statin-induced myalgias; Reviewed importance of limiting foods high in cholesterol;   Hypertension: (Status: Goal on Track (progressing): YES.) Last practice recorded BP readings:     BP Readings from Last 3 Encounters:  10/06/21 121/73  08/07/21 115/84  08/01/21 109/65  Most recent eGFR/CrCl:       Lab Results  Component Value Date    EGFR 72 08/01/2021    No components found for: CRCL   Evaluation of current treatment plan related to hypertension self management and patient's adherence to plan as established by provider. 10-10-2021: The patient is having normal blood pressures and denies any issues with HTN or heart health. Will continue to monitor for changes  Provided education to patient re: stroke prevention, s/s of heart attack and stroke; Reviewed prescribed diet heart healthy/ADA diet. 10-10-2021: Review and education done.  Reviewed medications with patient and discussed importance of compliance;  Discussed plans with patient for ongoing care management follow up and provided patient with direct contact information for care management team; Advised patient, providing education and rationale, to monitor blood pressure daily and record, calling PCP for findings outside established parameters;  Advised patient to discuss blood pressure trends  with provider; Provided education on prescribed diet heart healthy/ADA ;  Discussed complications of poorly controlled blood pressure such as heart disease, stroke, circulatory complications, vision complications, kidney impairment, sexual dysfunction;    Pain:  (Status: Goal on Track (progressing): YES.) Long Term Goal  Pain assessment performed. 08-08-2021: The patient rates her  pain level at a "zero" today. She states most of the time she has pain at night that wakes her from her sleep. She says the medications help her. She follows up with the specialist on 08-16-2021. Advised the patient to discuss with the specialist when she is having pain. 10-10-2021: Denies any pain today. Sees specialist on 10-14-2021 for evaluation of rotator cuff Medications reviewed- 10-10-2021: Is compliant with medications Reviewed provider established plan for pain management; Discussed importance of adherence to all scheduled medical appointments. 10-11-2021 with pcp, 10-14-2021 with the specialist  Counseled on the importance of reporting any/all new or changed pain symptoms or management strategies to pain management provider; Advised patient to report to care team affect of pain on daily activities; Discussed use of relaxation techniques and/or diversional activities to assist with pain reduction (distraction, imagery, relaxation, massage, acupressure, TENS, heat, and cold application. 08-08-2021: Education on using heat application to help with pain relief. Reviewed with patient prescribed pharmacological and nonpharmacological pain relief strategies; Advised patient to discuss unrelieved pain or changes in level of intensity of pain  with provider; 08-08-2021: The patient states she needs a new BSC because the leg of  hers broke. Will collaborate with the pcp about getting the patient a new chair. 10-10-2021: Has DME   Patient Goals/Self-Care Activities: Take medications as prescribed   Attend all scheduled provider appointments Call pharmacy for medication refills  3-7 days in advance of running out of medications Attend church or other social activities Perform all self care activities independently  Perform IADL's (shopping, preparing meals, housekeeping, managing finances) independently Call provider office for new concerns or questions  Work with the social worker to address care  coordination needs and will continue to work with the clinical team to address health care and disease management related needs call 911 call the Suicide and Crisis Lifeline: 988 call the [place] National Suicide Prevention Lifeline: (309) 874-8895 call 1-800-273-TALK (toll free, 24 hour hotline) if experiencing a Mental Health or [place]  schedule appointment with eye doctor check blood sugar at prescribed times: when you have symptoms of low or high blood sugar, before and after exercise, and as directed   check feet daily for cuts, sores or redness enter blood sugar readings and medication or insulin into daily log take the blood sugar log to all doctor visits trim toenails straight across drink 6 to 8 glasses of water each day eat fish at least once per week fill half of plate with vegetables keep a food diary limit fast food meals to no more than 1 per week manage portion size prepare main meal at home 3 to 5 days each week read food labels for fat, fiber, carbohydrates and portion size keep feet up while sitting wash and dry feet carefully every day wear comfortable, cotton socks wear comfortable, well-fitting shoes - avoid second hand smoke - eliminate smoking in my home - identify and remove indoor air pollutants - limit outdoor activity during cold weather - listen for public air quality announcements every day - do breathing exercises every day - develop a rescue plan - eliminate symptom triggers at home - follow rescue plan if symptoms flare-up - use an extra pillow to sleep - develop a new routine to improve sleep - don't eat or exercise right before bedtime - get at least 7 to 8 hours of sleep at night - use devices that will help like a cane, sock-puller or reacher - practice relaxation or meditation daily - do exercises in a comfortable position that makes breathing as easy as possible check blood pressure daily choose a place to take my blood  pressure (home, clinic or office, retail store) write blood pressure results in a log or diary learn about high blood pressure keep a blood pressure log take blood pressure log to all doctor appointments call doctor for signs and symptoms of high blood pressure develop an action plan for high blood pressure keep all doctor appointments take medications for blood pressure exactly as prescribed report new symptoms to your doctor eat more whole grains, fruits and vegetables, lean meats and healthy fats - call for medicine refill 2 or 3 days before it runs out - take all medications exactly as prescribed - call doctor with any symptoms you believe are related to your medicine - call doctor when you experience any new symptoms - go to all doctor appointments as scheduled - adhere to prescribed diet: heart healthy/ADA diet     Our next appointment is by telephone on 11-14-2021 at 1030 am  Please call the care guide team at [phone] if you need to cancel or reschedule your appointment.   If you are experiencing a Mental Health or [place] or need someone to talk to, please call the Suicide and Crisis Lifeline: 988 call the [place] National Suicide Prevention Lifeline: (770)745-2412 or TTY: 951 042 4604 TTY 336-395-4938) to talk to a  trained counselor call 1-800-273-TALK (toll free, 24 hour hotline)   The patient verbalized understanding of instructions, educational materials, and care plan provided today and declined offer to receive copy of patient instructions, educational materials, and care plan.   [name] RN, MSN, [place] Mobile: [phone]

## 2021-10-11 ENCOUNTER — Other Ambulatory Visit: Payer: Self-pay

## 2021-10-11 ENCOUNTER — Ambulatory Visit (INDEPENDENT_AMBULATORY_CARE_PROVIDER_SITE_OTHER): Payer: 59 | Admitting: Internal Medicine

## 2021-10-11 DIAGNOSIS — R079 Chest pain, unspecified: Secondary | ICD-10-CM | POA: Diagnosis not present

## 2021-10-11 DIAGNOSIS — J208 Acute bronchitis due to other specified organisms: Secondary | ICD-10-CM

## 2021-10-11 DIAGNOSIS — Z8709 Personal history of other diseases of the respiratory system: Secondary | ICD-10-CM

## 2021-10-11 MED ORDER — BENZONATATE 200 MG PO CAPS: 200.0000 mg | ORAL_CAPSULE | Freq: Three times a day (TID) | ORAL | 0 refills | Status: DC | PRN

## 2021-10-11 MED ORDER — PREDNISONE 10 MG PO TABS: 10.0000 mg | ORAL_TABLET | Freq: Every day | ORAL | 0 refills | Status: DC

## 2021-10-11 NOTE — Progress Notes (Signed)
Virtual Visit via Telephone Note  I connected with Kendra [name] on 10/11/21 at  4:00 PM EST by telephone and verified that I am speaking with the correct person using two identifiers.  Location: Patient: Home Provider: Office  Person's participating in this video call: Webb Silversmith, NP and Dennie Maizes.   I discussed the limitations, risks, security and privacy concerns of performing an evaluation and management service by telephone and the availability of in person appointments. I also discussed with the patient that there may be a patient responsible charge related to this service. The patient expressed understanding and agreed to proceed.   History of Present Illness:  Pt due for ER follow up. She went to the ER 1/19 with c/o cough, congestion and chest pain. That had started 1 week prior. ECG showed sinus tachycardia. Chest xray was negative for infiltrate or effusion. She tested negative for covid and influenza. CBC showed mild anemia but no leukocytosis. BMP was unremarkable. Troponins were negative. They offered to admit her for observation but she opted to be discharged with close PCP follow up. Since discharge, she reports runny nose and a cough.  She is blowing clear mucus out of her nose.  The cough is productive of clear mucus.  She currently denies headache, nasal congestion, ear pain, sore throat, shortness of breath, chest pain, nausea, vomiting, fever, chills or body aches.  She does have a history of COPD, managed on Trelegy and Albuterol. She does continue to smoke.   Past Medical History:  Diagnosis Date   Asthma    Back pain    Cataract    Cervical disc disorder    COPD (chronic obstructive pulmonary disease) (HCC)    Depression    Diabetes (HCC)    type II   Diabetic neuropathy (HCC)    Dyspnea    with exertion   GERD (gastroesophageal reflux disease)    History of kidney stones    per patient "can't remember the year"   Hyperlipidemia    Hypertension     per patient "take meds for it"   Insomnia    Neuropathy    Osteoarthritis    Osteopenia    Pneumonia 2018   per patient   Reflux    Rheumatoid arthritis ([place])    Stroke ([place]) 2015   and again 2017  - Weakness in left leg, staggers w/ walking, vision    Tenosynovitis     Current Outpatient Medications  Medication Sig Dispense Refill   ACCU-CHEK GUIDE test strip USE AS DIRECTED. UP TO 4 TIMES DAILY ONCE DAILY 400 each 1   Accu-Chek Softclix Lancets lancets USE AS DIRECTED. UP TO 4 TIMES A DAY 400 each 1   Acetaminophen (TYLENOL 8 HOUR ARTHRITIS PAIN PO) Take 2 capsules by mouth in the morning, at noon, in the evening, and at bedtime.     albuterol (VENTOLIN HFA) 108 (90 Base) MCG/ACT inhaler INHALE 1-2 PUFFS BY MOUTH EVERY 6 HOURS AS NEEDED WHEEZING/ SHORTNESS OF BREATH 8.5 g 1   aspirin EC 81 MG tablet Take 1 tablet (81 mg total) by mouth daily. Swallow whole. 90 tablet 1   atorvastatin (LIPITOR) 40 MG tablet TAKE 1 TABLET BY MOUTH BEDTIME 90 tablet 0   blood glucose meter kit and supplies KIT Dispense based on patient and insurance preference. Use up to four times daily as directed. 1 each 0   Blood Glucose Monitoring Suppl (ONETOUCH VERIO) w/Device KIT USE AS DIRECTED 1 kit 0  buPROPion (WELLBUTRIN XL) 150 MG 24 hr tablet TAKE 1 TABLET BY MOUTH ONCE DAILY 90 tablet 0   clopidogrel (PLAVIX) 75 MG tablet TAKE 1 TABLET BY MOUTH ONCE DAILY 90 tablet 0   cyclobenzaprine (FLEXERIL) 5 MG tablet Take 1 tablet (5 mg total) by mouth 3 (three) times daily as needed for muscle spasms. 30 tablet 2   docusate sodium (COLACE) 100 MG capsule Take 1 capsule (100 mg total) by mouth 2 (two) times daily as needed for mild constipation. 60 capsule 2   Lancet Devices (ONE TOUCH DELICA LANCING DEV) MISC 1 Device by Does not apply route 2 (two) times daily. 200 each 4   metFORMIN (GLUCOPHAGE) 500 MG tablet Take 1 tablet (500 mg total) by mouth 2 (two) times daily with a meal. (Patient taking differently:  Take 500 mg by mouth daily with breakfast.) 60 tablet 2   naproxen (NAPROSYN) 375 MG tablet Take 1 tablet (375 mg total) by mouth 2 (two) times daily with a meal. 14 tablet 0   traZODone (DESYREL) 50 MG tablet Take 0.5-1 tablets (25-50 mg total) by mouth at bedtime as needed for sleep. 90 tablet 0   TRELEGY ELLIPTA 100-62.5-25 MCG/ACT AEPB USE ONE INHALATION INTO THE LUNGS ONCE A DAY RINSE MOUTH AFTER EACH USE 60 each 1   No current facility-administered medications for this visit.    Allergies  Allergen Reactions   Citalopram Nausea And Vomiting    Family History  Problem Relation Age of Onset   Stroke Father    Depression Father    Diabetes Mother    Hyperlipidemia Mother    Breast cancer Mother    Heart murmur Son    Heart murmur Son    Heart murmur Son     Social History   Socioeconomic History   Marital status: Widowed    Spouse name: Not on file   Number of children: Not on file   Years of education: Not on file   Highest education level: Not on file  Occupational History   Not on file  Tobacco Use   Smoking status: Some Days    Packs/day: 0.25    Years: 61.00    Pack years: 15.25    Types: Cigarettes   Smokeless tobacco: Former    Types: Snuff  Vaping Use   Vaping Use: Never used  Substance and Sexual Activity   Alcohol use: No   Drug use: No   Sexual activity: Never  Other Topics Concern   Not on file  Social History Narrative   Not on file   Social Determinants of Health   Financial Resource Strain: Not on file  Food Insecurity: Not on file  Transportation Needs: Not on file  Physical Activity: Not on file  Stress: Not on file  Social Connections: Not on file  Intimate Partner Violence: Not on file     Constitutional: Denies fever, malaise, fatigue, headache or abrupt weight changes.  HEENT: Patient reports runny nose.  Denies eye pain, eye redness, ear pain, ringing in the ears, wax buildup,  nasal congestion, bloody nose, or sore  throat. Respiratory: Pt reports cough. Denies difficulty breathing, shortness of breath.   Cardiovascular: Denies chest pain, chest tightness, palpitations or swelling in the hands or feet.  Gastrointestinal: Denies abdominal pain, bloating, constipation, diarrhea or blood in the stool.   No other specific complaints in a complete review of systems (except as listed in HPI above).  Observations/Objective:   Wt Readings from Last  3 Encounters:  08/01/21 130 lb 3.2 oz (59.1 kg)  06/28/21 129 lb 8 oz (58.7 kg)  06/20/21 131 lb (59.4 kg)    General: In NAD. HEENT: Nose: No congestion noted; Throat/Mouth: Slight hoarseness noted Pulmonary/Chest: Normal effort. No respiratory distress.  BMET    Component Value Date/Time   NA 136 10/06/2021 1243   NA 141 01/18/2016 1150   NA 138 09/01/2014 2130   K 3.8 10/06/2021 1243   K 3.5 09/01/2014 2130   CL 101 10/06/2021 1243   CL 104 09/01/2014 2130   CO2 25 10/06/2021 1243   CO2 28 09/01/2014 2130   GLUCOSE 139 (H) 10/06/2021 1243   GLUCOSE 287 (H) 09/01/2014 2130   BUN 22 10/06/2021 1243   BUN 12 01/18/2016 1150   BUN 14 09/01/2014 2130   CREATININE 0.82 10/06/2021 1243   CREATININE 0.85 08/01/2021 1021   CALCIUM 9.8 10/06/2021 1243   CALCIUM 8.9 09/01/2014 2130   GFRNONAA >60 10/06/2021 1243   GFRNONAA 70 06/14/2020 0929   GFRAA 82 06/14/2020 0929    Lipid Panel     Component Value Date/Time   CHOL 135 08/01/2021 1021   CHOL 177 06/07/2015 1215   CHOL 141 09/02/2014 0440   TRIG 89 08/01/2021 1021   TRIG 155 09/02/2014 0440   HDL 65 08/01/2021 1021   HDL 56 06/07/2015 1215   HDL 37 (L) 09/02/2014 0440   CHOLHDL 2.1 08/01/2021 1021   VLDL 16 10/17/2019 0606   VLDL 31 09/02/2014 0440   LDLCALC 53 08/01/2021 1021   LDLCALC 73 09/02/2014 0440    CBC    Component Value Date/Time   WBC 5.3 10/06/2021 1243   RBC 3.53 (L) 10/06/2021 1243   HGB 11.4 (L) 10/06/2021 1243   HGB 12.7 09/01/2014 2130   HCT 33.5 (L)  10/06/2021 1243   HCT 37.9 09/01/2014 2130   PLT 259 10/06/2021 1243   PLT 260 09/05/2014 0517   MCV 94.9 10/06/2021 1243   MCV 92 09/01/2014 2130   MCH 32.3 10/06/2021 1243   MCHC 34.0 10/06/2021 1243   RDW 12.6 10/06/2021 1243   RDW 13.9 09/01/2014 2130   LYMPHSABS 1.5 06/06/2021 1314   MONOABS 0.3 06/06/2021 1314   EOSABS 0.1 06/06/2021 1314   BASOSABS 0.0 06/06/2021 1314    Hgb A1C Lab Results  Component Value Date   HGBA1C 7.7 (H) 08/01/2021        Assessment and Plan:  ER Follow Up for Nonspecific Chest Pain, Viral Bronchitis, Hx of COPD:  ER notes, labs and imaging reviewed Rx for Pred taper x6 days-monitor sugars Rx for Tessalon 100 mg 3 times daily as needed for cough Advised her to start Zyrtec and Flonase OTC  Return precautions discussed   Return precautions discussed  Follow Up Instructions:    I discussed the assessment and treatment plan with the patient. The patient was provided an opportunity to ask questions and all were answered. The patient agreed with the plan and demonstrated an understanding of the instructions.   The patient was advised to call back or seek an in-person evaluation if the symptoms worsen or if the condition fails to improve as anticipated.  I provided 12:16 minutes of non-face-to-face time during this encounter.   Webb Silversmith, NP

## 2021-10-12 ENCOUNTER — Encounter: Payer: Self-pay | Admitting: Internal Medicine

## 2021-10-12 NOTE — Patient Instructions (Signed)
Acute Bronchitis, Adult ?Acute bronchitis is when air tubes in the lungs (bronchi) suddenly get swollen. The condition can make it hard for you to breathe. In adults, acute bronchitis usually goes away within 2 weeks. A cough caused by bronchitis may last up to 3 weeks. Smoking, allergies, and asthma can make the condition worse. ?What are the causes? ?Germs that cause cold and flu (viruses). The most common cause of this condition is the virus that causes the common cold. ?Bacteria. ?Substances that bother (irritate) the lungs, including: ?Smoke from cigarettes and other types of tobacco. ?Dust and pollen. ?Fumes from chemicals, gases, or burned fuel. ?Indoor or outdoor air pollution. ?What increases the risk? ?A weak body's defense system. This is also called the immune system. ?Any condition that affects your lungs and breathing, such as asthma. ?What are the signs or symptoms? ?A cough. ?Coughing up clear, yellow, or green mucus. ?Making high-pitched whistling sounds when you breathe, most often when you breathe out (wheezing). ?Runny or stuffy nose. ?Having too much mucus in your lungs (chest congestion). ?Shortness of breath. ?Body aches. ?A sore throat. ?How is this treated? ?Acute bronchitis may go away over time without treatment. Your doctor may tell you to: ?Drink more fluids. This will help thin your mucus so it is easier to cough up. ?Use a device that gets medicine into your lungs (inhaler). ?Use a vaporizer or a humidifier. These are machines that add water to the air. This helps with coughing and poor breathing. ?Take a medicine that thins mucus and helps clear it from your lungs. ?Take a medicine that prevents or stops coughing. ?It is not common to take an antibiotic medicine for this condition. ?Follow these instructions at home: ? ?Take over-the-counter and prescription medicines only as told by your doctor. ?Use an inhaler, vaporizer, or humidifier as told by your doctor. ?Take two teaspoons (10  mL) of honey at bedtime. This helps lessen your coughing at night. ?Drink enough fluid to keep your pee (urine) pale yellow. ?Do not smoke or use any products that contain nicotine or tobacco. If you need help quitting, ask your doctor. ?Get a lot of rest. ?Return to your normal activities when your doctor says that it is safe. ?Keep all follow-up visits. ?How is this prevented? ? ?Wash your hands often with soap and water for at least 20 seconds. If you cannot use soap and water, use hand sanitizer. ?Avoid contact with people who have cold symptoms. ?Try not to touch your mouth, nose, or eyes with your hands. ?Avoid breathing in smoke or chemical fumes. ?Make sure to get the flu shot every year. ?Contact a doctor if: ?Your symptoms do not get better in 2 weeks. ?You have trouble coughing up the mucus. ?Your cough keeps you awake at night. ?You have a fever. ?Get help right away if: ?You cough up blood. ?You have chest pain. ?You have very bad shortness of breath. ?You faint or keep feeling like you are going to faint. ?You have a very bad headache. ?Your fever or chills get worse. ?These symptoms may be an emergency. Get help right away. Call your local emergency services (911 in the [place] ?Do not wait to see if the symptoms will go away. ?Do not drive yourself to the hospital. ?Summary ?Acute bronchitis is when air tubes in the lungs (bronchi) suddenly get swollen. In adults, acute bronchitis usually goes away within 2 weeks. ?Drink more fluids. This will help thin your mucus so it is easier to   cough up. ?Take over-the-counter and prescription medicines only as told by your doctor. ?Contact a doctor if your symptoms do not improve after 2 weeks of treatment. ?This information is not intended to replace advice given to you by your health care provider. Make sure you discuss any questions you have with your health care provider. ?Document Revised: 01/05/2021 Document Reviewed: 01/05/2021 ?Elsevier Patient Education  ? 2022 Elsevier Inc. ? ?

## 2021-10-13 ENCOUNTER — Telehealth: Payer: Self-pay

## 2021-10-13 NOTE — Telephone Encounter (Signed)
Copied from [place] [phone]. Topic: General - Other >> Oct 07, 2021 10:33 AM Tessa Lerner A wrote: Reason for CRM: The patient was seen last night at [facility] and notified that they have bronchitis  The patient would like to know if a medication can be prescribed to help with their symptoms   The patient is currently experiencing coughing and soreness related to it  Please contact further when possible  The patient declined to schedule an additional appt at the time of call with agent

## 2021-10-14 ENCOUNTER — Other Ambulatory Visit: Payer: Self-pay | Admitting: Internal Medicine

## 2021-10-14 NOTE — Telephone Encounter (Signed)
Requested Prescriptions  Pending Prescriptions Disp Refills   clopidogrel (PLAVIX) 75 MG tablet [Pharmacy Med Name: CLOPIDOGREL BISULFATE 75 MG TAB] 90 tablet 0    Sig: TAKE 1 TABLET BY MOUTH ONCE DAILY     Hematology: Antiplatelets - clopidogrel Failed - 10/14/2021  4:30 PM      Failed - Evaluate AST, ALT within 2 months of therapy initiation.      Failed - HCT in normal range and within 180 days    HCT  Date Value Ref Range Status  10/06/2021 33.5 (L) 36.0 - 46.0 % Final  09/01/2014 37.9 35.0 - 47.0 % Final         Failed - HGB in normal range and within 180 days    Hemoglobin  Date Value Ref Range Status  10/06/2021 11.4 (L) 12.0 - 15.0 g/dL Final   HGB  Date Value Ref Range Status  09/01/2014 12.7 12.0 - 16.0 g/dL Final         Passed - ALT in normal range and within 360 days    ALT  Date Value Ref Range Status  08/01/2021 17 6 - 29 U/L Final   SGPT (ALT)  Date Value Ref Range Status  09/01/2014 24 U/L Final    Comment:    14-63 NOTE: New Reference Range 04/07/14          Passed - AST in normal range and within 360 days    AST  Date Value Ref Range Status  08/01/2021 14 10 - 35 U/L Final   SGOT(AST)  Date Value Ref Range Status  09/01/2014 20 15 - 37 Unit/L Final         Passed - PLT in normal range and within 180 days    Platelets  Date Value Ref Range Status  10/06/2021 259 150 - 400 K/uL Final   Platelet  Date Value Ref Range Status  09/05/2014 260 150 - 440 x10 3/mm 3 Final         Passed - Valid encounter within last 6 months    Recent Outpatient Visits          3 days ago Nonspecific chest pain   [facility] [place], Coralie Keens, NP   1 week ago Chest pain, unspecified type   [facility] [place], Coralie Keens, NP   2 months ago Controlled type 2 diabetes mellitus with complication, without long-term current use of insulin [name])   [facility] [place], Coralie Keens, NP   3 months ago Chronic right shoulder  pain   [facility] [place], Coralie Keens, NP   4 months ago Aortic atherosclerosis [facility])   [facility] [place], Coralie Keens, NP      Future Appointments            In 1 month [name], Coralie Keens, NP [name], [facility]

## 2021-10-18 DIAGNOSIS — E1159 Type 2 diabetes mellitus with other circulatory complications: Secondary | ICD-10-CM

## 2021-10-18 DIAGNOSIS — F3341 Major depressive disorder, recurrent, in partial remission: Secondary | ICD-10-CM

## 2021-10-18 DIAGNOSIS — J410 Simple chronic bronchitis: Secondary | ICD-10-CM

## 2021-10-18 DIAGNOSIS — E785 Hyperlipidemia, unspecified: Secondary | ICD-10-CM

## 2021-10-18 DIAGNOSIS — I152 Hypertension secondary to endocrine disorders: Secondary | ICD-10-CM

## 2021-10-18 DIAGNOSIS — E1169 Type 2 diabetes mellitus with other specified complication: Secondary | ICD-10-CM

## 2021-10-18 DIAGNOSIS — E118 Type 2 diabetes mellitus with unspecified complications: Secondary | ICD-10-CM

## 2021-10-21 NOTE — Chronic Care Management (AMB) (Signed)
°  Care Management   Note  10/21/2021 Name: Kendra [name] MRN: [id number] DOB: [birth year]  Kendra [name] is a 76 y.o. year old female who is a primary care patient of Garnette Gunner, Coralie Keens, NP and is actively engaged with the care management team. I reached out to [name] by phone today to assist with re-scheduling a follow up visit with the Pharmacist  Follow up plan: Unsuccessful telephone outreach attempt made.  The care management team will reach out to the patient again over the next 7 days.  If patient returns call to provider office, please advise to call [facility]  at [place], [place], [place], [place] [address] Direct Dial: [phone] [email or web address]: [email or web address]

## 2021-10-27 NOTE — Telephone Encounter (Signed)
Third unsuccessful outreach  

## 2021-10-27 NOTE — Chronic Care Management (AMB) (Signed)
°  Care Management   Note  10/27/2021 Name: Kendra [name] MRN: [id number] DOB: Oct 15, 1945  Tresa Moore Bumgarner is a 76 y.o. year old female who is a primary care patient of Garnette Gunner, Coralie Keens, NP and is actively engaged with the care management team. I reached out to [name] by phone today to assist with re-scheduling a follow up visit with the Pharmacist  Follow up plan: Unable to make contact on outreach attempts x 3. PCP [name], NP notified via routed documentation in medical record.   [name], [address], [place], [place] [address] Direct Dial: [phone] [email or web address]: [email or web address]

## 2021-10-31 ENCOUNTER — Inpatient Hospital Stay: Payer: 59 | Attending: Oncology

## 2021-11-07 ENCOUNTER — Ambulatory Visit (INDEPENDENT_AMBULATORY_CARE_PROVIDER_SITE_OTHER): Payer: 59 | Admitting: Pharmacist

## 2021-11-07 DIAGNOSIS — E118 Type 2 diabetes mellitus with unspecified complications: Secondary | ICD-10-CM

## 2021-11-07 DIAGNOSIS — E1169 Type 2 diabetes mellitus with other specified complication: Secondary | ICD-10-CM

## 2021-11-07 DIAGNOSIS — J410 Simple chronic bronchitis: Secondary | ICD-10-CM

## 2021-11-07 NOTE — Chronic Care Management (AMB) (Signed)
Chronic Care Management CCM Pharmacy Note  11/07/2021 Name:  Kendra [name] MRN:  [id number] DOB:  [birth year]   Subjective: Kendra [name] is an 76 y.o. year old female who is a primary patient of [name], NP.  The CCM team was consulted for assistance with disease management and care coordination needs.    Engaged with patient by telephone for follow up visit for pharmacy case management and/or care coordination services.   Objective:  Medications Reviewed Today     Reviewed by Rennis Petty, RPH-CPP (Pharmacist) on 11/07/21 at 63  Med List Status: <None>   Medication Order Taking? Sig Documenting Provider Last Dose Status Informant  ACCU-CHEK GUIDE test strip [id number]  USE AS DIRECTED. UP TO 4 TIMES DAILY ONCE DAILY [name], NP  Active   Accu-Chek Softclix Lancets lancets [id number]  USE AS DIRECTED. UP TO 4 TIMES A DAY [name], Regina W, NP  Active   Acetaminophen (TYLENOL 8 HOUR ARTHRITIS PAIN PO) [id number] Yes Take 2 capsules by mouth in the morning, at noon, in the evening, and at bedtime. [provider] Taking Active Multiple Informants  albuterol (VENTOLIN HFA) 108 (90 Base) MCG/ACT inhaler [id number] Yes INHALE 1-2 PUFFS BY MOUTH EVERY 6 HOURS AS NEEDED WHEEZING/ SHORTNESS OF BREATH [name], NP Taking Active   aspirin EC 81 MG tablet [id number] Yes Take 1 tablet (81 mg total) by mouth daily. Swallow whole. [name], NP Taking Active   atorvastatin (LIPITOR) 40 MG tablet [id number] Yes TAKE 1 TABLET BY MOUTH BEDTIME [name], NP Taking Active   blood glucose meter kit and supplies KIT [id number]  Dispense based on patient and insurance preference. Use up to four times daily as directed. [name], NP  Active   Blood Glucose Monitoring Suppl [facility] VERIO) w/Device Drucie Opitz [id number]  USE AS DIRECTED [name], FNP  Active Multiple Informants  buPROPion (WELLBUTRIN XL) 150 MG 24 hr tablet [id number] Yes TAKE 1 TABLET BY  MOUTH ONCE DAILY [name], NP Taking Active   clopidogrel (PLAVIX) 75 MG tablet [id number] Yes TAKE 1 TABLET BY MOUTH ONCE DAILY [name], Coralie Keens, NP Taking Active   cyclobenzaprine (FLEXERIL) 5 MG tablet [id number]  Take 1 tablet (5 mg total) by mouth 3 (three) times daily as needed for muscle spasms. [name], NP  Active   docusate sodium (COLACE) 100 MG capsule [id number] Yes Take 1 capsule (100 mg total) by mouth 2 (two) times daily as needed for mild constipation. [name], NP Taking Active   Lancet Devices ([place] LANCING DEV) MISC [id number]  1 Device by Does not apply route 2 (two) times daily. [name], FNP  Active Multiple Informants  metFORMIN (GLUCOPHAGE) 500 MG tablet [id number] Yes Take 1 tablet (500 mg total) by mouth 2 (two) times daily with a meal. [name], NP Taking Active   traZODone (DESYREL) 50 MG tablet [id number] Yes Take 0.5-1 tablets (25-50 mg total) by mouth at bedtime as needed for sleep. [name], NP Taking Active   Kendra [name] 100-62.5-25 MCG/ACT AEPB [id number] Yes USE ONE INHALATION INTO THE LUNGS ONCE A DAY RINSE MOUTH AFTER EACH USE [name], NP Taking Active             Pertinent Labs:  Lab Results  Component Value Date   HGBA1C 7.7 (H) 08/01/2021   Lab Results  Component Value Date   CHOL 135  HDL 65 08/01/2021  ° LDLCALC 53 08/01/2021  ° TRIG 89 08/01/2021  ° CHOLHDL 2.1 08/01/2021  ° °Lab Results  °Component Value Date  ° CREATININE 0.82 10/06/2021  ° BUN 22 10/06/2021  ° NA 136 10/06/2021  ° K 3.8 10/06/2021  ° CL 101 10/06/2021  ° CO2 25 10/06/2021  ° °BP Readings from Last 3 Encounters:  °10/06/21 121/73  °08/07/21 115/84  °08/01/21 109/65  ° °Pulse Readings from Last 3 Encounters:  °10/06/21 85  °08/07/21 94  °08/01/21 61  ° ° ° °SDOH:  (Social Determinants of Health) assessments and interventions performed:  ° ° °CCM Care Plan ° °Review of patient past medical history, allergies,  medications, health status, including review of consultants reports, laboratory and other test data, was performed as part of comprehensive evaluation and provision of chronic care management services.  ° °Care Plan : PharmD - Medication Mgmt  °Updates made by Delles, Elisabeth A, RPH-CPP since 11/07/2021 12:00 AM  °  ° °Problem: Disease Progression   °  ° °Long-Range Goal: Disease Progression Prevented or Minimized   °Start Date: 11/01/2020  °Expected End Date: 01/30/2021  °Recent Progress: On track  °Priority: High  °Note:   °Current Barriers:  °Financial Barriers °Limited social support ° °Pharmacist Clinical Goal(s):  °Over the next 90 days, patient will maintain adherence to monitoring guidelines and medication adherence to achieve therapeutic efficacy through collaboration with PharmD and provider.  ° °Interventions: °1:1 collaboration with Regina [name], NP regarding development and update of comprehensive plan of care as evidenced by provider attestation and co-signature °Inter-disciplinary care team collaboration (see longitudinal plan of care) °Perform chart review.  °Patient had Virtual Visit with PCP on 1/24 for ER follow up. Provider advised: °Rx sent for Pred taper x6 days-monitor sugars °Rx sent for Tessalon 100 mg 3 times daily as needed for cough °Advised her to start Zyrtec and Flonase OTC °Note from chart/patient reports has missed appointments with [facility], [facility] and [facility]  °Today reports she completed course of prednisone as prescribed by PCP and her breathing is a whole lot better °Reports she had a fall off of a stool Saturday evening, but denies any injury °Denies currently using cane or walker °Encourage patient to use cane or walker around home for support/fall prevention °Comprehensive medication review performed; medication list updated in electronic medical record °Caution patient for risk of dizziness, sedation and falls with cyclobenzaprine and  trazodone, particularly if taken in combination °Counsel patient on using these medications only as needed and using the lowest dose needed ° °Medication Management/adherence: °Reports continues to take medication from pill pack as packaged by [name] °Counsel patient on importance of using maintenance inhaler (Trelegy) daily and rescue inhaler (albuterol) as needed as directed. Remind patient to rinse out mouth after each use of Trelegy ° °Tobacco Use: °Congratulate patient as reports has quit smoking for 1 week °Motivation: her health; not wanting to have another procedure ° °T2DM: °Current treatment: metformin 500 mg twice daily °Reports fasting morning blood sugar today: 115 °Encourage patient to have regular well-balanced meals throughout the day, limiting carbohydrate portion sizes °Once able to use monitor, encourage patient to monitor home blood sugars and keep a log of the results °Will mail patient new blood sugar log as requested °  °Coordination of Care: °Reports recently missed appointments due to transportation/trouble with her phone not consistently working. °Reports she is working on getting new telephone to help with her communication with medical offices/not   missing appointments °Today provide patient with phone numbers for offices to reschedule missed appointments °Reports will call to reschedule, as well as call to arrange transportation for appointments ° °Patient Goals/Self-Care Activities °Over the next 90 days, patient will:  °- take medications as prescribed ° Using pill packaging from [facility] °- check glucose, document, and provide at future appointments °- check blood pressure, document, and provide at future appointments ° °Follow Up Plan: Telephone follow up appointment with care management team member scheduled for: 3/22 at 10 am ° °  °  ° °Elisabeth Delles, PharmD, BCACP, CPP °Clinical Pharmacist °[facility] °[place] °[phone] ° ° ° ° ° °

## 2021-11-07 NOTE — Patient Instructions (Signed)
Visit Information  Thank you for taking time to visit with me today. Please don't hesitate to contact me if I can be of assistance to you before our next scheduled telephone appointment.  Following are the goals we discussed today:   Goals Addressed             This Visit's Progress    Pharmacy - Patient Goals       Please continue to keep log of home blood sugar results and have for [place] to review during our calls  Our goal bad cholesterol, or LDL, is less than 70 . This is why it is important to continue taking your atorvastatin.  I look forward to talking to you during our next telephone appointment. Please call if you need something sooner!  Wallace Cullens, PharmD, [place] [phone]          Our next appointment is by telephone on 3/22 at 10 am  Please call the care guide team at [phone] if you need to cancel or reschedule your appointment.    The patient verbalized understanding of instructions, educational materials, and care plan provided today and declined offer to receive copy of patient instructions, educational materials, and care plan.

## 2021-11-14 ENCOUNTER — Ambulatory Visit: Payer: Self-pay

## 2021-11-14 ENCOUNTER — Telehealth: Payer: 59

## 2021-11-14 DIAGNOSIS — E785 Hyperlipidemia, unspecified: Secondary | ICD-10-CM

## 2021-11-14 DIAGNOSIS — F3341 Major depressive disorder, recurrent, in partial remission: Secondary | ICD-10-CM

## 2021-11-14 DIAGNOSIS — G8929 Other chronic pain: Secondary | ICD-10-CM

## 2021-11-14 DIAGNOSIS — E1159 Type 2 diabetes mellitus with other circulatory complications: Secondary | ICD-10-CM

## 2021-11-14 DIAGNOSIS — I152 Hypertension secondary to endocrine disorders: Secondary | ICD-10-CM

## 2021-11-14 DIAGNOSIS — Z8709 Personal history of other diseases of the respiratory system: Secondary | ICD-10-CM

## 2021-11-14 DIAGNOSIS — M150 Primary generalized (osteo)arthritis: Secondary | ICD-10-CM

## 2021-11-14 DIAGNOSIS — J410 Simple chronic bronchitis: Secondary | ICD-10-CM

## 2021-11-14 DIAGNOSIS — M159 Polyosteoarthritis, unspecified: Secondary | ICD-10-CM

## 2021-11-14 DIAGNOSIS — E1169 Type 2 diabetes mellitus with other specified complication: Secondary | ICD-10-CM

## 2021-11-14 DIAGNOSIS — R296 Repeated falls: Secondary | ICD-10-CM

## 2021-11-14 DIAGNOSIS — E118 Type 2 diabetes mellitus with unspecified complications: Secondary | ICD-10-CM

## 2021-11-14 NOTE — Chronic Care Management (AMB) (Signed)
Chronic Care Management   CCM RN Visit Note  11/14/2021 Name: Kendra [name] MRN: [id number] DOB: 12-Sep-1946  Subjective: Kendra [name] is a 76 y.o. year old female who is a primary care patient of [name], NP. The care management team was consulted for assistance with disease management and care coordination needs.    Engaged with patient by telephone for follow up visit in response to provider referral for case management and/or care coordination services.   Consent to Services:  The patient was given information about Chronic Care Management services, agreed to services, and gave verbal consent prior to initiation of services.  Please see initial visit note for detailed documentation.   Patient agreed to services and verbal consent obtained.   Assessment: Review of patient past medical history, allergies, medications, health status, including review of consultants reports, laboratory and other test data, was performed as part of comprehensive evaluation and provision of chronic care management services.   SDOH (Social Determinants of Health) assessments and interventions performed:  SDOH Interventions    Flowsheet Row Most Recent Value  SDOH Interventions   Food Insecurity Interventions Intervention Not Indicated  Financial Strain Interventions Intervention Not Indicated  Housing Interventions Intervention Not Indicated  Intimate Partner Violence Interventions Intervention Not Indicated  Physical Activity Interventions Other (Comments)  [encouraged activity]  Stress Interventions Intervention Not Indicated  Social Connections Interventions Other (Comment)  [good family support]  Transportation Interventions Intervention Not Indicated        CCM Care Plan  Allergies  Allergen Reactions   Citalopram Nausea And Vomiting    Outpatient Encounter Medications as of 11/14/2021  Medication Sig   ACCU-CHEK GUIDE test strip USE AS DIRECTED. UP TO 4 TIMES DAILY ONCE  DAILY   Accu-Chek Softclix Lancets lancets USE AS DIRECTED. UP TO 4 TIMES A DAY   Acetaminophen (TYLENOL 8 HOUR ARTHRITIS PAIN PO) Take 2 capsules by mouth in the morning, at noon, in the evening, and at bedtime.   albuterol (VENTOLIN HFA) 108 (90 Base) MCG/ACT inhaler INHALE 1-2 PUFFS BY MOUTH EVERY 6 HOURS AS NEEDED WHEEZING/ SHORTNESS OF BREATH   aspirin EC 81 MG tablet Take 1 tablet (81 mg total) by mouth daily. Swallow whole.   atorvastatin (LIPITOR) 40 MG tablet TAKE 1 TABLET BY MOUTH BEDTIME   blood glucose meter kit and supplies KIT Dispense based on patient and insurance preference. Use up to four times daily as directed.   Blood Glucose Monitoring Suppl (ONETOUCH VERIO) w/Device KIT USE AS DIRECTED   buPROPion (WELLBUTRIN XL) 150 MG 24 hr tablet TAKE 1 TABLET BY MOUTH ONCE DAILY   clopidogrel (PLAVIX) 75 MG tablet TAKE 1 TABLET BY MOUTH ONCE DAILY   cyclobenzaprine (FLEXERIL) 5 MG tablet Take 1 tablet (5 mg total) by mouth 3 (three) times daily as needed for muscle spasms.   docusate sodium (COLACE) 100 MG capsule Take 1 capsule (100 mg total) by mouth 2 (two) times daily as needed for mild constipation.   Lancet Devices (ONE TOUCH DELICA LANCING DEV) MISC 1 Device by Does not apply route 2 (two) times daily.   metFORMIN (GLUCOPHAGE) 500 MG tablet Take 1 tablet (500 mg total) by mouth 2 (two) times daily with a meal.   traZODone (DESYREL) 50 MG tablet Take 0.5-1 tablets (25-50 mg total) by mouth at bedtime as needed for sleep.   TRELEGY ELLIPTA 100-62.5-25 MCG/ACT AEPB USE ONE INHALATION INTO THE LUNGS ONCE A DAY RINSE MOUTH AFTER EACH USE   No  facility-administered encounter medications on file as of 11/14/2021.    Patient Active Problem List   Diagnosis Date Noted   Aortic atherosclerosis ([place]) 05/31/2021   COPD (chronic obstructive pulmonary disease) ([place]) 05/31/2021   Anemia 05/31/2021   Hemiparesis affecting left side as late effect of stroke ([place]) 02/02/2021   Recurrent major  depressive disorder, in partial remission ([place]) 02/02/2021   Carotid artery stenosis 12/30/2019   Hypertension associated with diabetes ([place]) 12/19/2018   Diabetes mellitus type 2, controlled, with complications ([place]) 51/70/0174   Insomnia 03/30/2015   Hyperlipidemia associated with type 2 diabetes mellitus ([place]) 03/30/2015   Gastro-esophageal reflux disease without esophagitis 03/30/2015   Osteoarthritis 03/30/2015   Osteopenia 03/30/2015   B12 deficiency 03/30/2015    Conditions to be addressed/monitored:HTN, HLD, COPD, DMII, Depression, Osteoarthritis, and falls  Care Plan : RNCM: General Plan of Care (Adult) for Chronic Disease Management and Care Coordination Needs  Updates made by Vanita Ingles, RN since 11/14/2021 12:00 AM     Problem: RNCM: Development of Plan of Care For Chronic Disease Management (HTN, HLD, DM, OA, COPD, Depression)   Priority: High     Long-Range Goal: RNCM: Effective Management  of Plan of Care For Chronic Disease Management (HTN, HLD, DM, OA, COPD, Depression)   Start Date: 08/08/2021  Expected End Date: 08/08/2022  Priority: High  Note:   Current Barriers:  Knowledge Deficits related to plan of care for management of HTN, HLD, COPD, DMII, Depression: depressed mood anxiety, and chronic pain with OA to right shoulder  Care Coordination needs related to Limited social support, Mental Health Concerns , and Literacy concerns  Chronic Disease Management support and education needs related to HTN, HLD, COPD, DMII, Depression: depressed mood anxiety, and Chronic Pain/OA Lacks caregiver support.         RNCM Clinical Goal(s):  Patient will verbalize basic understanding of HTN, HLD, COPD, DMII, Depression, and Osteoarthritis disease process and self health management plan as evidenced by following plan of care, keeping appointments, calling the provider office for changes in conditions and working with the CCM team to optimize health and well being  take all  medications exactly as prescribed and will call provider for medication related questions as evidenced by compliance with medications and calling the office for refill needs     attend all scheduled medical appointments: pcp appointment on 11-28-2021 at 0900 am as evidenced by keeping appointments and calling the office for needed schedule changes        continue to work with RN Care Manager and/or Social Worker to address care management and care coordination needs related to HTN, HLD, COPD, DMII, Depression, and Osteoarthritis as evidenced by adherence to CM Team Scheduled appointments     demonstrate a decrease in HTN, HLD, COPD, DMII, Depression, and Osteoarthritis exacerbations  as evidenced by following the plan of care and calling for any changes or acute exacerbations in chronic conditions  demonstrate ongoing self health care management ability for effective management of Chronic conditions  as evidenced by  working with the CCM team through collaboration with Consulting civil engineer, provider, and care team.   Interventions: 1:1 collaboration with primary care provider regarding development and update of comprehensive plan of care as evidenced by provider attestation and co-signature Inter-disciplinary care team collaboration (see longitudinal plan of care) Evaluation of current treatment plan related to  self management and patient's adherence to plan as established by provider   COPD: (Status: Goal on Track (progressing): YES.) Long Term  Goal  Reviewed medications with patient, including use of prescribed maintenance and rescue inhalers, and provided instruction on medication management and the importance of adherence. 11-14-2021: The patient is compliant with medications.  Provided patient with basic written and verbal COPD education on self care/management/and exacerbation prevention. 10-10-2021: The patient was in the ER for evaluation of chest pain on 10-06-2021. The patient states that they told  her she had "bronchitis" but did not give her any medications. The patient states she has a runny nose and the drainage is clear. Denies fever or chills, education and appointment secured for the patient to talk to pcp on 10-11-2021 via phone at 4 pm. Review and confirmed with the patient. 11-14-2021: The patient states she is doing better and denies any issues related to COPD or changes in her breathing. She has stopped smoking and continues to refrain from smoking. Praised the patient for positive changes made for her health and well being. The patient says it is hard not to smoke but she is dedicated to refrain from smoking.  Advised patient to track and manage COPD triggers. 11-14-2021: Review with the patient and education provider on factors that can trigger COPD exacerbation. The patient states the cold and raining weather causes issues and she knows to monitor closely for changes in her condition.  Provided written and verbal instructions on pursed lip breathing and utilized returned demonstration as teach back Provided instruction about proper use of medications used for management of COPD including inhalers Advised patient to self assesses COPD action plan zone and make appointment with provider if in the yellow zone for 48 hours without improvement Advised patient to engage in light exercise as tolerated 3-5 days a week to aid in the the management of COPD Provided education about and advised patient to utilize infection prevention strategies to reduce risk of respiratory infection Discussed the importance of adequate rest and management of fatigue with COPD. 11-14-2021: Review and the patient is staying safe and denies any acute findings. Denies any issues with CP or respiratory conditions.  Screening for signs and symptoms of depression related to chronic disease state  Assessed social determinant of health barriers  Diabetes:  (Status: Goal on Track (progressing): YES.) Long Term Goal   Lab  Results  Component Value Date   HGBA1C 7.7 (H) 08/01/2021  Assessed patient's understanding of A1c goal: <7%. 08-08-2021: States her A1C was elevated this time. Review of steroid shots may have caused some of the reason for elevation. The patient states she did not have her paper with her and is having to write down her numbers. Will mail the patient more sheets to record blood pressures and blood sugars. 11-14-2021: Denies any acute findings related to DM. Gave the RNCM several readings related to DM management.  Provided education to patient about basic DM disease process; Reviewed medications with patient and discussed importance of medication adherence. 08-08-2021: The patient has started back on Metformin 500 mg BID, endorses compliance. Denies any issues.  11-14-2021: The patient is compliant with medications, denies any acute findings.     Reviewed prescribed diet with patient heart healthy/ADA- 11-14-2021: The patient is compliant with heart healthy/ADA diet ; Counseled on importance of regular laboratory monitoring as prescribed. 11-14-2021: The patient is compliant with getting regular lab work for evaluation;        Discussed plans with patient for ongoing care management follow up and provided patient with direct contact information for care management team. 11-14-2021: Has an appointment on 11-28-2021  to see the pcp for follow up and recommendations.       Provided patient with written educational materials related to hypo and hyperglycemia and importance of correct treatment. 10-10-2021: Denies any lows but states she knows how to monitor and treat . 11-14-2021: States compliance with checking blood sugars. Denies any lows and states the lowest she has seen is 92. Highest is 243 and that was an isolated incident. She is mindful of her blood sugar readings and does not want to go back on the "needle". Education and support given.      Reviewed scheduled/upcoming provider appointments including: next  appointment with the pcp is 11-28-2021 at 0900 am Advised patient, providing education and rationale, to check cbg before meals and at bedtime, when you have symptoms of low or high blood sugar, and before and after exercise and record. 10-10-2021: The patient takes blood sugars and records. Denies any acute findings.   11-14-2021: Takes readings on a consistent basis. See chart below for most recent readings:    BS readings     10/26/2021 92 11/07/2021 115  10/30/2021 135 11/08/2021 139  10/31/2021 108 11/09/2021 120  11/01/2021 243 11/10/2021 97  11/03/2021 108 11/11/2021 120  11/04/2021 115 11/12/2021 121  11/06/2021 179 11/13/2021 141  call provider for findings outside established parameters;       Review of patient status, including review of consultants reports, relevant laboratory and other test results, and medications completed;       Review of last eye exam. 11-14-2021: The patient states it has been about a year and a half since she has had an eye exam. Ask the patient to get an appointment for follow up with her eye doctor and the importance of regular eye exams.   Depression  (Status: Goal on Track (progressing): YES.) Long Term Goal  Evaluation of current treatment plan related to Depression, Mental Health Concerns  self-management and patient's adherence to plan as established by provider. 10-10-2021: The patient denies any acute distress. She is not feeling the best right now due to her recent visit to the ER and diagnosis of bronchitis. She also has appointments coming up to have her rotator cuff evaluated. The patient states that she is just wanting to feel better. Denies any mental health needs at this time. 11-14-2021: The patient states that she is doing well and denies any issues with depression or anxiety at this time. Did have a fall a week ago Saturday. States that she is okay and not injured. Discussed safety and falls prevention. Also encouraged the patient to continue to work with CCM team for  needs and concerns. The patient is happy she has the CCM team for ongoing support and education. Will continue to monitor.  Discussed plans with patient for ongoing care management follow up and provided patient with direct contact information for care management team Advised patient to call the office for changes in mood, anxiety, or depression ; Provided education to patient re: staying positive, being around positive people, not being around negativity, pacing activity and working with the CCM team to effectively manage mental health and well being; Reviewed medications with patient and discussed compliance. 11-14-2021: The patient states compliance with medications ; Discussed plans with patient for ongoing care management follow up and provided patient with direct contact information for care management team; Screening for signs and symptoms of depression related to chronic disease state;  Assessed social determinant of health barriers;   Hyperlipidemia:  (Status: Goal on  Track (progressing): YES.) Long Term Goal  Lab Results  Component Value Date   CHOL 135 08/01/2021   HDL 65 08/01/2021   LDLCALC 53 08/01/2021   TRIG 89 08/01/2021   CHOLHDL 2.1 08/01/2021     Medication review performed; medication list updated in electronic medical record. 2-27--2023: Is compliant with medications regimen. Takes Atorvastatin 40 mg daily Provider established cholesterol goals reviewed. 11-14-2021: The patient is at goal and doing well with cholesterol management Counseled on importance of regular laboratory monitoring as prescribed; Provided HLD educational materials; Reviewed role and benefits of statin for ASCVD risk reduction; Discussed strategies to manage statin-induced myalgias; Reviewed importance of limiting foods high in cholesterol. 11-14-2021: The patient is eating good and denies any concerns related to heart healthy/ADA diet.   Hypertension: (Status: Goal on Track (progressing):  YES.) Last practice recorded BP readings:  BP Readings from Last 3 Encounters:  10/06/21 121/73  08/07/21 115/84  08/01/21 109/65  Most recent eGFR/CrCl:  Lab Results  Component Value Date   EGFR 72 08/01/2021    No components found for: CRCL  Evaluation of current treatment plan related to hypertension self management and patient's adherence to plan as established by provider. 11-14-2021: The patient is having normal blood pressures and denies any issues with HTN or heart health. Will continue to monitor for changes  Provided education to patient re: stroke prevention, s/s of heart attack and stroke; Reviewed prescribed diet heart healthy/ADA diet. 11-14-2021: Review and education done.  Reviewed medications with patient and discussed importance of compliance. 11-14-2021:  Discussed plans with patient for ongoing care management follow up and provided patient with direct contact information for care management team; Advised patient, providing education and rationale, to monitor blood pressure daily and record, calling PCP for findings outside established parameters; 11-14-2021: The patient gave several readings: BP and Pulse   11/01/2021 111/77-99  11/02/2021 114/86-89  11/03/2021 948/01-65  11/04/2021 122/84-91  Advised patient to discuss blood pressure trends  with provider; Provided education on prescribed diet heart healthy/ADA. 11-14-2021: Education and support given to the patient ;  Discussed complications of poorly controlled blood pressure such as heart disease, stroke, circulatory complications, vision complications, kidney impairment, sexual dysfunction;   Pain:  (Status: Goal on Track (progressing): YES.) Long Term Goal  Pain assessment performed. 08-08-2021: The patient rates her pain level at a "zero" today. She states most of the time she has pain at night that wakes her from her sleep. She says the medications help her. She follows up with the specialist on 08-16-2021. Advised the  patient to discuss with the specialist when she is having pain. 10-10-2021: Denies any pain today. Sees specialist on 10-14-2021 for evaluation of rotator cuff. 11-14-2021: Denies any issues with pain or discomfort today. The patient is following specialist for rotator cuff concerns. The patient states her left leg is dragging also. Wants to discuss on upcoming visit with the pcp.  Medications reviewed- 11-14-2021: Is compliant with medications Reviewed provider established plan for pain management; Discussed importance of adherence to all scheduled medical appointments. 11-28-2021 with pcp Counseled on the importance of reporting any/all new or changed pain symptoms or management strategies to pain management provider; Advised patient to report to care team affect of pain on daily activities; Discussed use of relaxation techniques and/or diversional activities to assist with pain reduction (distraction, imagery, relaxation, massage, acupressure, TENS, heat, and cold application. 08-08-2021: Education on using heat application to help with pain relief. Reviewed with patient prescribed pharmacological and  nonpharmacological pain relief strategies; Advised patient to discuss unrelieved pain or changes in level of intensity of pain  with provider; 08-08-2021: The patient states she needs a new BSC because the leg of  hers broke. Will collaborate with the pcp about getting the patient a new chair. 10-10-2021: Has DME. 11-14-2021: Is interested in a stand up walker to use when ambulating. Will discuss with the pcp at upcoming visit.    Falls:  (Status: Goal on Track (progressing): YES.) Long Term Goal  Provided written and verbal education re: potential causes of falls and Fall prevention strategies Reviewed medications and discussed potential side effects of medications such as dizziness and frequent urination Advised patient of importance of notifying provider of falls. 11-14-2021: Education on notifying the  provider for changes in her conditions and new falls. Will continue to monitor.  Assessed for signs and symptoms of orthostatic hypotension Assessed for falls since last encounter. 11-14-2021: The patient states her last fall was 11-05-2021. The patient states she was on her [facility] and it threw her out of the chair. The patient states that she was not injured. Education and support given.  Assessed patients knowledge of fall risk prevention secondary to previously provided education Provided patient information for fall alert systems Assessed working status of life alert bracelet and patient adherence Advised patient to discuss new falls and injuries, and stand up walker she can use when ambulating with provider 11-14-2021: The patient inquired about a stand up walker to use. Will collaborate with the pcp concerning the request.  This is am Upright walker-standing.   Patient Goals/Self-Care Activities: Take medications as prescribed   Attend all scheduled provider appointments Call pharmacy for medication refills 3-7 days in advance of running out of medications Attend church or other social activities Perform all self care activities independently  Perform IADL's (shopping, preparing meals, housekeeping, managing finances) independently Call provider office for new concerns or questions  Work with the social worker to address care coordination needs and will continue to work with the clinical team to address health care and disease management related needs call 911 call the Suicide and Crisis Lifeline: 988 call the [place] National Suicide Prevention Lifeline: 502-675-6012 call 1-800-273-TALK (toll free, 24 hour hotline) if experiencing a Mental Health or [place]  schedule appointment with eye doctor check blood sugar at prescribed times: when you have symptoms of low or high blood sugar, before and after exercise, and as directed   check feet daily for cuts, sores or redness enter  blood sugar readings and medication or insulin into daily log take the blood sugar log to all doctor visits trim toenails straight across drink 6 to 8 glasses of water each day eat fish at least once per week fill half of plate with vegetables keep a food diary limit fast food meals to no more than 1 per week manage portion size prepare main meal at home 3 to 5 days each week read food labels for fat, fiber, carbohydrates and portion size keep feet up while sitting wash and dry feet carefully every day wear comfortable, cotton socks wear comfortable, well-fitting shoes - avoid second hand smoke - eliminate smoking in my home - identify and remove indoor air pollutants - limit outdoor activity during cold weather - listen for public air quality announcements every day - do breathing exercises every day - develop a rescue plan - eliminate symptom triggers at home - follow rescue plan if symptoms flare-up - use an extra pillow  to sleep - develop a new routine to improve sleep - don't eat or exercise right before bedtime - get at least 7 to 8 hours of sleep at night - use devices that will help like a cane, sock-puller or reacher - practice relaxation or meditation daily - do exercises in a comfortable position that makes breathing as easy as possible check blood pressure daily choose a place to take my blood pressure (home, clinic or office, retail store) write blood pressure results in a log or diary learn about high blood pressure keep a blood pressure log take blood pressure log to all doctor appointments call doctor for signs and symptoms of high blood pressure develop an action plan for high blood pressure keep all doctor appointments take medications for blood pressure exactly as prescribed report new symptoms to your doctor eat more whole grains, fruits and vegetables, lean meats and healthy fats - call for medicine refill 2 or 3 days before it runs out - take all  medications exactly as prescribed - call doctor with any symptoms you believe are related to your medicine - call doctor when you experience any new symptoms - go to all doctor appointments as scheduled - adhere to prescribed diet: heart healthy/ADA diet        Plan:Telephone follow up appointment with care management team member scheduled for:  12-26-2021 at 34 am  [name] RN, MSN, [facility] [place] Mobile: [phone]

## 2021-11-14 NOTE — Patient Instructions (Signed)
Visit Information  Thank you for taking time to visit with me today. Please don't hesitate to contact me if I can be of assistance to you before our next scheduled telephone appointment.  Following are the goals we discussed today:  RNCM Clinical Goal(s):  Patient will verbalize basic understanding of HTN, HLD, COPD, DMII, Depression, and Osteoarthritis disease process and self health management plan as evidenced by following plan of care, keeping appointments, calling the provider office for changes in conditions and working with the CCM team to optimize health and well being  take all medications exactly as prescribed and will call provider for medication related questions as evidenced by compliance with medications and calling the office for refill needs     attend all scheduled medical appointments: pcp appointment on 11-28-2021 at 0900 am as evidenced by keeping appointments and calling the office for needed schedule changes        continue to work with RN Care Manager and/or Social Worker to address care management and care coordination needs related to HTN, HLD, COPD, DMII, Depression, and Osteoarthritis as evidenced by adherence to CM Team Scheduled appointments     demonstrate a decrease in HTN, HLD, COPD, DMII, Depression, and Osteoarthritis exacerbations  as evidenced by following the plan of care and calling for any changes or acute exacerbations in chronic conditions  demonstrate ongoing self health care management ability for effective management of Chronic conditions  as evidenced by  working with the CCM team through collaboration with Consulting civil engineer, provider, and care team.    Interventions: 1:1 collaboration with primary care provider regarding development and update of comprehensive plan of care as evidenced by provider attestation and co-signature Inter-disciplinary care team collaboration (see longitudinal plan of care) Evaluation of current treatment plan related to  self  management and patient's adherence to plan as established by provider     COPD: (Status: Goal on Track (progressing): YES.) Long Term Goal  Reviewed medications with patient, including use of prescribed maintenance and rescue inhalers, and provided instruction on medication management and the importance of adherence. 11-14-2021: The patient is compliant with medications.  Provided patient with basic written and verbal COPD education on self care/management/and exacerbation prevention. 10-10-2021: The patient was in the ER for evaluation of chest pain on 10-06-2021. The patient states that they told her she had "bronchitis" but did not give her any medications. The patient states she has a runny nose and the drainage is clear. Denies fever or chills, education and appointment secured for the patient to talk to pcp on 10-11-2021 via phone at 4 pm. Review and confirmed with the patient. 11-14-2021: The patient states she is doing better and denies any issues related to COPD or changes in her breathing. She has stopped smoking and continues to refrain from smoking. Praised the patient for positive changes made for her health and well being. The patient says it is hard not to smoke but she is dedicated to refrain from smoking.  Advised patient to track and manage COPD triggers. 11-14-2021: Review with the patient and education provider on factors that can trigger COPD exacerbation. The patient states the cold and raining weather causes issues and she knows to monitor closely for changes in her condition.  Provided written and verbal instructions on pursed lip breathing and utilized returned demonstration as teach back Provided instruction about proper use of medications used for management of COPD including inhalers Advised patient to self assesses COPD action plan zone and make appointment  with provider if in the yellow zone for 48 hours without improvement Advised patient to engage in light exercise as tolerated  3-5 days a week to aid in the the management of COPD Provided education about and advised patient to utilize infection prevention strategies to reduce risk of respiratory infection Discussed the importance of adequate rest and management of fatigue with COPD. 11-14-2021: Review and the patient is staying safe and denies any acute findings. Denies any issues with CP or respiratory conditions.  Screening for signs and symptoms of depression related to chronic disease state  Assessed social determinant of health barriers   Diabetes:  (Status: Goal on Track (progressing): YES.) Long Term Goal         Lab Results  Component Value Date    HGBA1C 7.7 (H) 08/01/2021  Assessed patient's understanding of A1c goal: <7%. 08-08-2021: States her A1C was elevated this time. Review of steroid shots may have caused some of the reason for elevation. The patient states she did not have her paper with her and is having to write down her numbers. Will mail the patient more sheets to record blood pressures and blood sugars. 11-14-2021: Denies any acute findings related to DM. Gave the RNCM several readings related to DM management.  Provided education to patient about basic DM disease process; Reviewed medications with patient and discussed importance of medication adherence. 08-08-2021: The patient has started back on Metformin 500 mg BID, endorses compliance. Denies any issues.  11-14-2021: The patient is compliant with medications, denies any acute findings.     Reviewed prescribed diet with patient heart healthy/ADA- 11-14-2021: The patient is compliant with heart healthy/ADA diet ; Counseled on importance of regular laboratory monitoring as prescribed. 11-14-2021: The patient is compliant with getting regular lab work for evaluation;        Discussed plans with patient for ongoing care management follow up and provided patient with direct contact information for care management team. 11-14-2021: Has an appointment on  11-28-2021 to see the pcp for follow up and recommendations.       Provided patient with written educational materials related to hypo and hyperglycemia and importance of correct treatment. 10-10-2021: Denies any lows but states she knows how to monitor and treat . 11-14-2021: States compliance with checking blood sugars. Denies any lows and states the lowest she has seen is 92. Highest is 243 and that was an isolated incident. She is mindful of her blood sugar readings and does not want to go back on the "needle". Education and support given.      Reviewed scheduled/upcoming provider appointments including: next appointment with the pcp is 11-28-2021 at 0900 am Advised patient, providing education and rationale, to check cbg before meals and at bedtime, when you have symptoms of low or high blood sugar, and before and after exercise and record. 10-10-2021: The patient takes blood sugars and records. Denies any acute findings.   11-14-2021: Takes readings on a consistent basis. See chart below for most recent readings:    BS readings        10/26/2021 92 11/07/2021 115  10/30/2021 135 11/08/2021 139  10/31/2021 108 11/09/2021 120  11/01/2021 243 11/10/2021 97  11/03/2021 108 11/11/2021 120  11/04/2021 115 11/12/2021 121  11/06/2021 179 11/13/2021 141  call provider for findings outside established parameters;       Review of patient status, including review of consultants reports, relevant laboratory and other test results, and medications completed;       Review  of last eye exam. 11-14-2021: The patient states it has been about a year and a half since she has had an eye exam. Ask the patient to get an appointment for follow up with her eye doctor and the importance of regular eye exams.    Depression  (Status: Goal on Track (progressing): YES.) Long Term Goal  Evaluation of current treatment plan related to Depression, Mental Health Concerns  self-management and patient's adherence to plan as established by provider.  10-10-2021: The patient denies any acute distress. She is not feeling the best right now due to her recent visit to the ER and diagnosis of bronchitis. She also has appointments coming up to have her rotator cuff evaluated. The patient states that she is just wanting to feel better. Denies any mental health needs at this time. 11-14-2021: The patient states that she is doing well and denies any issues with depression or anxiety at this time. Did have a fall a week ago Saturday. States that she is okay and not injured. Discussed safety and falls prevention. Also encouraged the patient to continue to work with CCM team for needs and concerns. The patient is happy she has the CCM team for ongoing support and education. Will continue to monitor.  Discussed plans with patient for ongoing care management follow up and provided patient with direct contact information for care management team Advised patient to call the office for changes in mood, anxiety, or depression ; Provided education to patient re: staying positive, being around positive people, not being around negativity, pacing activity and working with the CCM team to effectively manage mental health and well being; Reviewed medications with patient and discussed compliance. 11-14-2021: The patient states compliance with medications ; Discussed plans with patient for ongoing care management follow up and provided patient with direct contact information for care management team; Screening for signs and symptoms of depression related to chronic disease state;  Assessed social determinant of health barriers;    Hyperlipidemia:  (Status: Goal on Track (progressing): YES.) Long Term Goal       Lab Results  Component Value Date    CHOL 135 08/01/2021    HDL 65 08/01/2021    LDLCALC 53 08/01/2021    TRIG 89 08/01/2021    CHOLHDL 2.1 08/01/2021      Medication review performed; medication list updated in electronic medical record. 2-27--2023: Is  compliant with medications regimen. Takes Atorvastatin 40 mg daily Provider established cholesterol goals reviewed. 11-14-2021: The patient is at goal and doing well with cholesterol management Counseled on importance of regular laboratory monitoring as prescribed; Provided HLD educational materials; Reviewed role and benefits of statin for ASCVD risk reduction; Discussed strategies to manage statin-induced myalgias; Reviewed importance of limiting foods high in cholesterol. 11-14-2021: The patient is eating good and denies any concerns related to heart healthy/ADA diet.    Hypertension: (Status: Goal on Track (progressing): YES.) Last practice recorded BP readings:     BP Readings from Last 3 Encounters:  10/06/21 121/73  08/07/21 115/84  08/01/21 109/65  Most recent eGFR/CrCl:       Lab Results  Component Value Date    EGFR 72 08/01/2021    No components found for: CRCL   Evaluation of current treatment plan related to hypertension self management and patient's adherence to plan as established by provider. 11-14-2021: The patient is having normal blood pressures and denies any issues with HTN or heart health. Will continue to monitor for changes  Provided education  to patient re: stroke prevention, s/s of heart attack and stroke; Reviewed prescribed diet heart healthy/ADA diet. 11-14-2021: Review and education done.  Reviewed medications with patient and discussed importance of compliance. 11-14-2021:  Discussed plans with patient for ongoing care management follow up and provided patient with direct contact information for care management team; Advised patient, providing education and rationale, to monitor blood pressure daily and record, calling PCP for findings outside established parameters; 11-14-2021: The patient gave several readings: BP and Pulse    11/01/2021 111/77-99  11/02/2021 114/86-89  11/03/2021 322/02-54  11/04/2021 122/84-91  Advised patient to discuss blood pressure trends   with provider; Provided education on prescribed diet heart healthy/ADA. 11-14-2021: Education and support given to the patient ;  Discussed complications of poorly controlled blood pressure such as heart disease, stroke, circulatory complications, vision complications, kidney impairment, sexual dysfunction;    Pain:  (Status: Goal on Track (progressing): YES.) Long Term Goal  Pain assessment performed. 08-08-2021: The patient rates her pain level at a "zero" today. She states most of the time she has pain at night that wakes her from her sleep. She says the medications help her. She follows up with the specialist on 08-16-2021. Advised the patient to discuss with the specialist when she is having pain. 10-10-2021: Denies any pain today. Sees specialist on 10-14-2021 for evaluation of rotator cuff. 11-14-2021: Denies any issues with pain or discomfort today. The patient is following specialist for rotator cuff concerns. The patient states her left leg is dragging also. Wants to discuss on upcoming visit with the pcp.  Medications reviewed- 11-14-2021: Is compliant with medications Reviewed provider established plan for pain management; Discussed importance of adherence to all scheduled medical appointments. 11-28-2021 with pcp Counseled on the importance of reporting any/all new or changed pain symptoms or management strategies to pain management provider; Advised patient to report to care team affect of pain on daily activities; Discussed use of relaxation techniques and/or diversional activities to assist with pain reduction (distraction, imagery, relaxation, massage, acupressure, TENS, heat, and cold application. 08-08-2021: Education on using heat application to help with pain relief. Reviewed with patient prescribed pharmacological and nonpharmacological pain relief strategies; Advised patient to discuss unrelieved pain or changes in level of intensity of pain  with provider; 08-08-2021: The patient  states she needs a new BSC because the leg of  hers broke. Will collaborate with the pcp about getting the patient a new chair. 10-10-2021: Has DME. 11-14-2021: Is interested in a stand up walker to use when ambulating. Will discuss with the pcp at upcoming visit.      Falls:  (Status: Goal on Track (progressing): YES.) Long Term Goal  Provided written and verbal education re: potential causes of falls and Fall prevention strategies Reviewed medications and discussed potential side effects of medications such as dizziness and frequent urination Advised patient of importance of notifying provider of falls. 11-14-2021: Education on notifying the provider for changes in her conditions and new falls. Will continue to monitor.  Assessed for signs and symptoms of orthostatic hypotension Assessed for falls since last encounter. 11-14-2021: The patient states her last fall was 11-05-2021. The patient states she was on her [facility] and it threw her out of the chair. The patient states that she was not injured. Education and support given.  Assessed patients knowledge of fall risk prevention secondary to previously provided education Provided patient information for fall alert systems Assessed working status of life alert bracelet and patient adherence Advised patient to  discuss new falls and injuries, and stand up walker she can use when ambulating with provider 11-14-2021: The patient inquired about a stand up walker to use. Will collaborate with the pcp concerning the request.  This is am Upright walker-standing.    Patient Goals/Self-Care Activities: Take medications as prescribed   Attend all scheduled provider appointments Call pharmacy for medication refills 3-7 days in advance of running out of medications Attend church or other social activities Perform all self care activities independently  Perform IADL's (shopping, preparing meals, housekeeping, managing finances) independently Call provider office for  new concerns or questions  Work with the social worker to address care coordination needs and will continue to work with the clinical team to address health care and disease management related needs call 911 call the Suicide and Crisis Lifeline: 988 call the [place] National Suicide Prevention Lifeline: 2892656913 call 1-800-273-TALK (toll free, 24 hour hotline) if experiencing a Mental Health or [place]  schedule appointment with eye doctor check blood sugar at prescribed times: when you have symptoms of low or high blood sugar, before and after exercise, and as directed   check feet daily for cuts, sores or redness enter blood sugar readings and medication or insulin into daily log take the blood sugar log to all doctor visits trim toenails straight across drink 6 to 8 glasses of water each day eat fish at least once per week fill half of plate with vegetables keep a food diary limit fast food meals to no more than 1 per week manage portion size prepare main meal at home 3 to 5 days each week read food labels for fat, fiber, carbohydrates and portion size keep feet up while sitting wash and dry feet carefully every day wear comfortable, cotton socks wear comfortable, well-fitting shoes - avoid second hand smoke - eliminate smoking in my home - identify and remove indoor air pollutants - limit outdoor activity during cold weather - listen for public air quality announcements every day - do breathing exercises every day - develop a rescue plan - eliminate symptom triggers at home - follow rescue plan if symptoms flare-up - use an extra pillow to sleep - develop a new routine to improve sleep - don't eat or exercise right before bedtime - get at least 7 to 8 hours of sleep at night - use devices that will help like a cane, sock-puller or reacher - practice relaxation or meditation daily - do exercises in a comfortable position that makes breathing as easy as  possible check blood pressure daily choose a place to take my blood pressure (home, clinic or office, retail store) write blood pressure results in a log or diary learn about high blood pressure keep a blood pressure log take blood pressure log to all doctor appointments call doctor for signs and symptoms of high blood pressure develop an action plan for high blood pressure keep all doctor appointments take medications for blood pressure exactly as prescribed report new symptoms to your doctor eat more whole grains, fruits and vegetables, lean meats and healthy fats - call for medicine refill 2 or 3 days before it runs out - take all medications exactly as prescribed - call doctor with any symptoms you believe are related to your medicine - call doctor when you experience any new symptoms - go to all doctor appointments as scheduled - adhere to prescribed diet: heart healthy/ADA diet       Our next appointment is by telephone on 12-26-2021  at 1030 am  Please call the care guide team at [phone] if you need to cancel or reschedule your appointment.   If you are experiencing a Mental Health or [place] or need someone to talk to, please call the Suicide and Crisis Lifeline: 988 call the [place] National Suicide Prevention Lifeline: 575-633-3154 or TTY: 419-710-1439 TTY (206)355-0203) to talk to a trained counselor call 1-800-273-TALK (toll free, 24 hour hotline)   The patient verbalized understanding of instructions, educational materials, and care plan provided today and declined offer to receive copy of patient instructions, educational materials, and care plan.   [name] RN, MSN, [place] Mobile: [phone]

## 2021-11-15 DIAGNOSIS — E118 Type 2 diabetes mellitus with unspecified complications: Secondary | ICD-10-CM

## 2021-11-15 DIAGNOSIS — J410 Simple chronic bronchitis: Secondary | ICD-10-CM | POA: Diagnosis not present

## 2021-11-15 DIAGNOSIS — E1169 Type 2 diabetes mellitus with other specified complication: Secondary | ICD-10-CM

## 2021-11-15 DIAGNOSIS — F3341 Major depressive disorder, recurrent, in partial remission: Secondary | ICD-10-CM

## 2021-11-15 DIAGNOSIS — E785 Hyperlipidemia, unspecified: Secondary | ICD-10-CM

## 2021-11-15 DIAGNOSIS — M159 Polyosteoarthritis, unspecified: Secondary | ICD-10-CM

## 2021-11-15 DIAGNOSIS — E1159 Type 2 diabetes mellitus with other circulatory complications: Secondary | ICD-10-CM

## 2021-11-15 DIAGNOSIS — I152 Hypertension secondary to endocrine disorders: Secondary | ICD-10-CM

## 2021-11-22 ENCOUNTER — Ambulatory Visit: Payer: Medicare Other

## 2021-11-28 ENCOUNTER — Encounter: Payer: Medicare Other | Admitting: Internal Medicine

## 2021-11-28 NOTE — Progress Notes (Unsigned)
Subjective:    Patient ID: Kendra [name], female    DOB: Feb 14, 1946, 76 y.o.   MRN: [id number]  HPI  Patient presents to clinic today for her annual exam.  Flu: 05/2021 Tetanus: 07/2010 COVID: [place] x2 Pneumovax: 07/2018 Prevnar: 10/2014 Zostavax: 05/2013 Shingrix: Never Pap smear: No longer screening Mammogram: 09/2020 Bone density: Never Colon screening: >10 years Vision screening: Dentist:  Diet: Exercise:  Review of Systems     Past Medical History:  Diagnosis Date   Asthma    Back pain    Cataract    Cervical disc disorder    COPD (chronic obstructive pulmonary disease) ([facility])    Depression    Diabetes ([place])    type II   Diabetic neuropathy ([place])    Dyspnea    with exertion   GERD (gastroesophageal reflux disease)    History of kidney stones    per patient "can't remember the year"   Hyperlipidemia    Hypertension    per patient "take meds for it"   Insomnia    Neuropathy    Osteoarthritis    Osteopenia    Pneumonia 2018   per patient   Reflux    Rheumatoid arthritis ([place])    Stroke ([place]) 2015   and again 2017  - Weakness in left leg, staggers w/ walking, vision    Tenosynovitis     Current Outpatient Medications  Medication Sig Dispense Refill   ACCU-CHEK GUIDE test strip USE AS DIRECTED. UP TO 4 TIMES DAILY ONCE DAILY 400 each 1   Accu-Chek Softclix Lancets lancets USE AS DIRECTED. UP TO 4 TIMES A DAY 400 each 1   Acetaminophen (TYLENOL 8 HOUR ARTHRITIS PAIN PO) Take 2 capsules by mouth in the morning, at noon, in the evening, and at bedtime.     albuterol (VENTOLIN HFA) 108 (90 Base) MCG/ACT inhaler INHALE 1-2 PUFFS BY MOUTH EVERY 6 HOURS AS NEEDED WHEEZING/ SHORTNESS OF BREATH 8.5 g 1   aspirin EC 81 MG tablet Take 1 tablet (81 mg total) by mouth daily. Swallow whole. 90 tablet 1   atorvastatin (LIPITOR) 40 MG tablet TAKE 1 TABLET BY MOUTH BEDTIME 90 tablet 0   blood glucose meter kit and supplies KIT Dispense based on patient and  insurance preference. Use up to four times daily as directed. 1 each 0   Blood Glucose Monitoring Suppl (ONETOUCH VERIO) w/Device KIT USE AS DIRECTED 1 kit 0   buPROPion (WELLBUTRIN XL) 150 MG 24 hr tablet TAKE 1 TABLET BY MOUTH ONCE DAILY 90 tablet 0   clopidogrel (PLAVIX) 75 MG tablet TAKE 1 TABLET BY MOUTH ONCE DAILY 90 tablet 0   cyclobenzaprine (FLEXERIL) 5 MG tablet Take 1 tablet (5 mg total) by mouth 3 (three) times daily as needed for muscle spasms. 30 tablet 2   docusate sodium (COLACE) 100 MG capsule Take 1 capsule (100 mg total) by mouth 2 (two) times daily as needed for mild constipation. 60 capsule 2   Lancet Devices (ONE TOUCH DELICA LANCING DEV) MISC 1 Device by Does not apply route 2 (two) times daily. 200 each 4   metFORMIN (GLUCOPHAGE) 500 MG tablet Take 1 tablet (500 mg total) by mouth 2 (two) times daily with a meal. 60 tablet 2   traZODone (DESYREL) 50 MG tablet Take 0.5-1 tablets (25-50 mg total) by mouth at bedtime as needed for sleep. 90 tablet 0   TRELEGY ELLIPTA 100-62.5-25 MCG/ACT AEPB USE ONE INHALATION INTO THE LUNGS ONCE A DAY  RINSE MOUTH AFTER EACH USE 60 each 1   No current facility-administered medications for this visit.    Allergies  Allergen Reactions   Citalopram Nausea And Vomiting    Family History  Problem Relation Age of Onset   Stroke Father    Depression Father    Diabetes Mother    Hyperlipidemia Mother    Breast cancer Mother    Heart murmur Son    Heart murmur Son    Heart murmur Son     Social History   Socioeconomic History   Marital status: Widowed    Spouse name: Not on file   Number of children: Not on file   Years of education: Not on file   Highest education level: Not on file  Occupational History   Not on file  Tobacco Use   Smoking status: Some Days    Packs/day: 0.25    Years: 61.00    Pack years: 15.25    Types: Cigarettes   Smokeless tobacco: Former    Types: Snuff  Vaping Use   Vaping Use: Never used   Substance and Sexual Activity   Alcohol use: No   Drug use: No   Sexual activity: Never  Other Topics Concern   Not on file  Social History Narrative   Not on file   Social Determinants of Health   Financial Resource Strain: Low Risk    Difficulty of Paying Living Expenses: Not very hard  Food Insecurity: No Food Insecurity   Worried About Charity fundraiser in the Last Year: Never true   [place] in the Last Year: Never true  Transportation Needs: No Transportation Needs   Lack of Transportation (Medical): No   Lack of Transportation (Non-Medical): No  Physical Activity: Inactive   Days of Exercise per Week: 0 days   Minutes of Exercise per Session: 0 min  Stress: No Stress Concern Present   Feeling of Stress : Only a little  Social Connections: Socially Isolated   Frequency of Communication with Friends and Family: More than three times a week   Frequency of Social Gatherings with Friends and Family: More than three times a week   Attends Religious Services: Never   Marine scientist or Organizations: No   Attends Archivist Meetings: Never   Marital Status: Widowed  Human resources officer Violence: Not At Risk   Fear of Current or Ex-Partner: No   Emotionally Abused: No   Physically Abused: No   Sexually Abused: No     Constitutional: Denies fever, malaise, fatigue, headache or abrupt weight changes.  HEENT: Denies eye pain, eye redness, ear pain, ringing in the ears, wax buildup, runny nose, nasal congestion, bloody nose, or sore throat. Respiratory: Denies difficulty breathing, shortness of breath, cough or sputum production.   Cardiovascular: Denies chest pain, chest tightness, palpitations or swelling in the hands or feet.  Gastrointestinal: Denies abdominal pain, bloating, constipation, diarrhea or blood in the stool.  GU: Denies urgency, frequency, pain with urination, burning sensation, blood in urine, odor or discharge. Musculoskeletal:  Patient reports left-sided weakness and intermittent joint pain.  Denies decrease in range of motion, difficulty with gait, muscle pain or joint swelling.  Skin: Denies redness, rashes, lesions or ulcercations.  Neurological: Patient reports insomnia.  Denies dizziness, difficulty with memory, difficulty with speech or problems with balance and coordination.  Psych: Patient has a history of depression.  Denies anxiety, SI/HI.  No other specific complaints in a  complete review of systems (except as listed in HPI above).  Objective:   Physical Exam  There were no vitals taken for this visit. Wt Readings from Last 3 Encounters:  08/01/21 130 lb 3.2 oz (59.1 kg)  06/28/21 129 lb 8 oz (58.7 kg)  06/20/21 131 lb (59.4 kg)    General: Appears their stated age, well developed, well nourished in NAD. Skin: Warm, dry and intact. No rashes, lesions or ulcerations noted. HEENT: Head: normal shape and size; Eyes: sclera white, no icterus, conjunctiva pink, PERRLA and EOMs intact; Ears: Tm's gray and intact, normal light reflex; Nose: mucosa pink and moist, septum midline; Throat/Mouth: Teeth present, mucosa pink and moist, no exudate, lesions or ulcerations noted.  Neck:  Neck supple, trachea midline. No masses, lumps or thyromegaly present.  Cardiovascular: Normal rate and rhythm. S1,S2 noted.  No murmur, rubs or gallops noted. No JVD or BLE edema. No carotid bruits noted. Pulmonary/Chest: Normal effort and positive vesicular breath sounds. No respiratory distress. No wheezes, rales or ronchi noted.  Abdomen: Soft and nontender. Normal bowel sounds. No distention or masses noted. Liver, spleen and kidneys non palpable. Musculoskeletal: Normal range of motion. No signs of joint swelling. No difficulty with gait.  Neurological: Alert and oriented. Cranial nerves II-XII grossly intact. Coordination normal.  Psychiatric: Mood and affect normal. Behavior is normal. Judgment and thought content normal.     BMET    Component Value Date/Time   NA 136 10/06/2021 1243   NA 141 01/18/2016 1150   NA 138 09/01/2014 2130   K 3.8 10/06/2021 1243   K 3.5 09/01/2014 2130   CL 101 10/06/2021 1243   CL 104 09/01/2014 2130   CO2 25 10/06/2021 1243   CO2 28 09/01/2014 2130   GLUCOSE 139 (H) 10/06/2021 1243   GLUCOSE 287 (H) 09/01/2014 2130   BUN 22 10/06/2021 1243   BUN 12 01/18/2016 1150   BUN 14 09/01/2014 2130   CREATININE 0.82 10/06/2021 1243   CREATININE 0.85 08/01/2021 1021   CALCIUM 9.8 10/06/2021 1243   CALCIUM 8.9 09/01/2014 2130   GFRNONAA >60 10/06/2021 1243   GFRNONAA 70 06/14/2020 0929   GFRAA 82 06/14/2020 0929    Lipid Panel     Component Value Date/Time   CHOL 135 08/01/2021 1021   CHOL 177 06/07/2015 1215   CHOL 141 09/02/2014 0440   TRIG 89 08/01/2021 1021   TRIG 155 09/02/2014 0440   HDL 65 08/01/2021 1021   HDL 56 06/07/2015 1215   HDL 37 (L) 09/02/2014 0440   CHOLHDL 2.1 08/01/2021 1021   VLDL 16 10/17/2019 0606   VLDL 31 09/02/2014 0440   LDLCALC 53 08/01/2021 1021   LDLCALC 73 09/02/2014 0440    CBC    Component Value Date/Time   WBC 5.3 10/06/2021 1243   RBC 3.53 (L) 10/06/2021 1243   HGB 11.4 (L) 10/06/2021 1243   HGB 12.7 09/01/2014 2130   HCT 33.5 (L) 10/06/2021 1243   HCT 37.9 09/01/2014 2130   PLT 259 10/06/2021 1243   PLT 260 09/05/2014 0517   MCV 94.9 10/06/2021 1243   MCV 92 09/01/2014 2130   MCH 32.3 10/06/2021 1243   MCHC 34.0 10/06/2021 1243   RDW 12.6 10/06/2021 1243   RDW 13.9 09/01/2014 2130   LYMPHSABS 1.5 06/06/2021 1314   MONOABS 0.3 06/06/2021 1314   EOSABS 0.1 06/06/2021 1314   BASOSABS 0.0 06/06/2021 1314    Hgb A1C Lab Results  Component Value Date  HGBA1C 7.7 (H) 08/01/2021            Assessment & Plan:   Preventative health maintenance:  Flu shot UTD She declines tetanus for financial reasons, advised her if she gets bit or cut she will need to have this done Pneumovax and Prevnar UTD Zostavax  UTD Discussed Shingrix vaccine, she will consider this in understands she should get at the pharmacy if she would like to have this done She no longer to screen for cervical cancer Mammogram and bone density ordered-she will call to schedule Cologuard ordered, she is aware this will be sent to her house Encouraged her to consume a balanced diet and exercise regimen Advised her to see an eye doctor and dentist annually Will check CBC, CMET, Lipid, A1C today  RTC in 6 months, follow up chronic conditions Webb Silversmith, NP This visit occurred during the SARS-CoV-2 public health emergency.  Safety protocols were in place, including screening questions prior to the visit, additional usage of staff PPE, and extensive cleaning of exam room while observing appropriate contact time as indicated for disinfecting solutions.

## 2021-12-07 ENCOUNTER — Telehealth: Payer: Self-pay | Admitting: Pharmacist

## 2021-12-07 ENCOUNTER — Telehealth: Payer: 59

## 2021-12-07 NOTE — Telephone Encounter (Signed)
?  Chronic Care Management  ? ?Outreach Note ? ?12/07/2021 ?Name: Kendra [name] MRN: [id number] DOB: [birth year] ? ?Referred by: [name], NP ?Reason for referral : No chief complaint on file. ? ? ?Was unable to reach patient via telephone today and unable to leave a message as voicemail box is full ? ? ?Follow Up Plan: CM Pharmacist will attempt to reach patient by telephone again within the next 30 days ? ?Wallace Cullens, PharmD, BCACP ?Clinical Pharmacist ?[facility] ?[phone] ? ?

## 2021-12-08 ENCOUNTER — Other Ambulatory Visit: Payer: Self-pay | Admitting: Internal Medicine

## 2021-12-08 DIAGNOSIS — G4700 Insomnia, unspecified: Secondary | ICD-10-CM

## 2021-12-08 NOTE — Telephone Encounter (Signed)
Medication Refill - Medication: Trazodone 50 mg  pt said she is not sleeping at night ? ?Has the patient contacted their pharmacy? Yes  they told her to call the office ?(Agent: If no, request that the patient contact the pharmacy for the refill. If patient does not wish to contact the pharmacy document the reason why and proceed with request.) ?(Agent: If yes, when and what did the pharmacy advise?) ? ?Preferred Pharmacy (with phone number or street name): [facility] in [place] ?Has the patient been seen for an appointment in the last year OR does the patient have an upcoming appointment? yes ? ?Agent: Please be advised that RX refills may take up to 3 business days. We ask that you follow-up with your pharmacy.  ?

## 2021-12-10 MED ORDER — TRAZODONE HCL 50 MG PO TABS: 25.0000 mg | ORAL_TABLET | Freq: Every evening | ORAL | 0 refills | Status: DC | PRN

## 2021-12-10 NOTE — Telephone Encounter (Signed)
Requested Prescriptions  ?Pending Prescriptions Disp Refills  ?? traZODone (DESYREL) 50 MG tablet 90 tablet 0  ?  Sig: Take 0.5-1 tablets (25-50 mg total) by mouth at bedtime as needed for sleep.  ?  ? Psychiatry: Antidepressants - Serotonin Modulator Passed - 12/08/2021 10:15 PM  ?  ?  Passed - Completed PHQ-2 or PHQ-9 in the last 360 days  ?  ?  Passed - Valid encounter within last 6 months  ?  Recent Outpatient Visits   ?      ? 2 months ago Nonspecific chest pain  ? [facility] [place], [place] W, NP  ? 2 months ago Chest pain, unspecified type  ? [facility] [place], [place] W, NP  ? 4 months ago Controlled type 2 diabetes mellitus with complication, without long-term current use of insulin ([place])  ? [facility] [place], [place] W, NP  ? 5 months ago Chronic right shoulder pain  ? [facility] [place], [place] W, NP  ? 6 months ago Aortic atherosclerosis [facility])  ? [facility] [place], Coralie Keens, NP  ?  ?  ? ?  ?  ?  ? ?

## 2021-12-13 ENCOUNTER — Other Ambulatory Visit: Payer: Self-pay | Admitting: Internal Medicine

## 2021-12-14 NOTE — Telephone Encounter (Signed)
Requested Prescriptions  ?Pending Prescriptions Disp Refills  ?? metFORMIN (GLUCOPHAGE) 500 MG tablet [Pharmacy Med Name: METFORMIN HCL 500 MG TAB] 60 tablet 2  ?  Sig: TAKE 1 TABLET BY MOUTH TWICE DAILY WITH A MEAL  ?  ? Endocrinology:  Diabetes - Biguanides Passed - 12/13/2021 10:50 AM  ?  ?  Passed - Cr in normal range and within 360 days  ?  Creat  ?Date Value Ref Range Status  ?08/01/2021 0.85 0.60 - 1.00 mg/dL Final  ? ?Creatinine, Ser  ?Date Value Ref Range Status  ?10/06/2021 0.82 0.44 - 1.00 mg/dL Final  ? ?Creatinine, Urine  ?Date Value Ref Range Status  ?05/31/2021 89 20 - 275 mg/dL Final  ?   ?  ?  Passed - HBA1C is between 0 and 7.9 and within 180 days  ?  Hemoglobin A1C  ?Date Value Ref Range Status  ?09/02/2014 9.5 (H) 4.2 - 6.3 % Final  ?  Comment:  ?  The American Diabetes Association recommends that a primary goal of ?therapy should be <7% and that physicians should reevaluate the ?treatment regimen in patients with HbA1c values consistently >8%. ?  ? ?Hgb A1c MFr Bld  ?Date Value Ref Range Status  ?08/01/2021 7.7 (H) <5.7 % of total Hgb Final  ?  Comment:  ?  For someone without known diabetes, a hemoglobin A1c ?value of 6.5% or greater indicates that they may have  ?diabetes and this should be confirmed with a follow-up  ?test. ?. ?For someone with known diabetes, a value <7% indicates  ?that their diabetes is well controlled and a value  ?greater than or equal to 7% indicates suboptimal  ?control. A1c targets should be individualized based on  ?duration of diabetes, age, comorbid conditions, and  ?other considerations. ?. ?Currently, no consensus exists regarding use of ?hemoglobin A1c for diagnosis of diabetes for children. ?. ?  ?   ?  ?  Passed - eGFR in normal range and within 360 days  ?  GFR, Est African American  ?Date Value Ref Range Status  ?06/14/2020 82 > OR = 60 mL/min/1.86m2 Final  ? ?GFR, Est Non African American  ?Date Value Ref Range Status  ?06/14/2020 70 > OR = 60 mL/min/1.74m2  Final  ? ?GFR, Estimated  ?Date Value Ref Range Status  ?10/06/2021 >60 >60 mL/min Final  ?  Comment:  ?  (NOTE) ?Calculated using the CKD-EPI Creatinine Equation (2021) ?  ? ?eGFR  ?Date Value Ref Range Status  ?08/01/2021 72 > OR = 60 mL/min/1.55m2 Final  ?  Comment:  ?  The eGFR is based on the CKD-EPI 2021 equation. To calculate  ?the new eGFR from a previous Creatinine or Cystatin C ?result, go to https://www.kidney.org/professionals/ ?kdoqi/gfr%5Fcalculator ?  ?   ?  ?  Passed - B12 Level in normal range and within 720 days  ?  Vitamin B-12  ?Date Value Ref Range Status  ?06/06/2021 564 180 - 914 pg/mL Final  ?  Comment:  ?  (NOTE) ?This assay is not validated for testing neonatal or ?myeloproliferative syndrome specimens for Vitamin B12 levels. ?Performed at [facility], [place] [address] [place], [place] ?[address] ?  ?   ?  ?  Passed - Valid encounter within last 6 months  ?  Recent Outpatient Visits   ?      ? 2 months ago Nonspecific chest pain  ? [facility] [place], Coralie Keens, NP  ? 2 months  ago Chest pain, unspecified type  ? [facility] [place], [place] W, NP  ? 4 months ago Controlled type 2 diabetes mellitus with complication, without long-term current use of insulin ([place])  ? [name] [place], [place] W, NP  ? 5 months ago Chronic right shoulder pain  ? [facility] [place], [place] W, NP  ? 6 months ago Aortic atherosclerosis [facility])  ? [facility] [place], [place] W, NP  ?  ?  ? ?  ?  ?  Passed - CBC within normal limits and completed in the last 12 months  ?  WBC  ?Date Value Ref Range Status  ?10/06/2021 5.3 4.0 - 10.5 K/uL Final  ? ?RBC  ?Date Value Ref Range Status  ?10/06/2021 3.53 (L) 3.87 - 5.11 MIL/uL Final  ? ?Hemoglobin  ?Date Value Ref Range Status  ?10/06/2021 11.4 (L) 12.0 - 15.0 g/dL Final  ? ?HGB  ?Date Value Ref Range Status  ?09/01/2014 12.7 12.0 - 16.0 g/dL Final  ? ?HCT  ?Date Value Ref Range Status   ?10/06/2021 33.5 (L) 36.0 - 46.0 % Final  ?09/01/2014 37.9 35.0 - 47.0 % Final  ? ?MCHC  ?Date Value Ref Range Status  ?10/06/2021 34.0 30.0 - 36.0 g/dL Final  ? ?MCH  ?Date Value Ref Range Status  ?10/06/2021 32.3 26.0 - 34.0 pg Final  ? ?MCV  ?Date Value Ref Range Status  ?10/06/2021 94.9 80.0 - 100.0 fL Final  ?09/01/2014 92 80 - 100 fL Final  ? ?No results found for: PLTCOUNTKUC, LABPLAT, [place] ?RDW  ?Date Value Ref Range Status  ?10/06/2021 12.6 11.5 - 15.5 % Final  ?09/01/2014 13.9 11.5 - 14.5 % Final  ? ?  ?  ?  ? ? ?

## 2021-12-16 ENCOUNTER — Other Ambulatory Visit: Payer: Self-pay | Admitting: *Deleted

## 2021-12-16 ENCOUNTER — Ambulatory Visit (INDEPENDENT_AMBULATORY_CARE_PROVIDER_SITE_OTHER): Payer: 59 | Admitting: Pharmacist

## 2021-12-16 ENCOUNTER — Other Ambulatory Visit: Payer: Self-pay | Admitting: Internal Medicine

## 2021-12-16 DIAGNOSIS — Z7984 Long term (current) use of oral hypoglycemic drugs: Secondary | ICD-10-CM

## 2021-12-16 DIAGNOSIS — E1169 Type 2 diabetes mellitus with other specified complication: Secondary | ICD-10-CM

## 2021-12-16 DIAGNOSIS — I6523 Occlusion and stenosis of bilateral carotid arteries: Secondary | ICD-10-CM

## 2021-12-16 DIAGNOSIS — J449 Chronic obstructive pulmonary disease, unspecified: Secondary | ICD-10-CM

## 2021-12-16 DIAGNOSIS — E118 Type 2 diabetes mellitus with unspecified complications: Secondary | ICD-10-CM

## 2021-12-16 DIAGNOSIS — J432 Centrilobular emphysema: Secondary | ICD-10-CM

## 2021-12-16 NOTE — Patient Instructions (Signed)
Visit Information ? ?Thank you for taking time to visit with me today. Please don't hesitate to contact me if I can be of assistance to you before our next scheduled telephone appointment. ? ?Following are the goals we discussed today:  ? Goals Addressed   ? ?  ?  ?  ?  ? This Visit's Progress  ?  Pharmacy - Patient Goals     ?  Please continue to keep log of home blood sugar results and have for [place] to review during our calls ? ?Our goal bad cholesterol, or LDL, is less than 70 . This is why it is important to continue taking your atorvastatin. ? ?I look forward to talking to you during our next telephone appointment. Please call if you need something sooner! ? ? ?Wallace Cullens, PharmD, BCACP ?Clinical Pharmacist ?[name] ?[place] ?[phone] ? ?  ? ?  ? ? ? ?Please call the care guide team at [phone] if you need to cancel or reschedule your appointment.  ? ? ?The patient verbalized understanding of instructions, educational materials, and care plan provided today and declined offer to receive copy of patient instructions, educational materials, and care plan.  ? ?

## 2021-12-16 NOTE — Chronic Care Management (AMB) (Signed)
? ?Chronic Care Management ?CCM Pharmacy Note ? ?12/16/2021 ?Name:  Kendra [name] MRN:  [id number] DOB:  02/23/1946 ? ?Subjective: ?Kendra [name] is an 75 y.o. year old female who is a primary patient of [name], Regina W, NP.  The CCM team was consulted for assistance with disease management and care coordination needs.   ? ?Engaged with patient by telephone for follow up visit for pharmacy case management and/or care coordination services.  ? ?Objective: ? ?Medications Reviewed Today   ? ? Reviewed by Delles, Elisabeth A, RPH-CPP (Pharmacist) on 12/16/21 at 1001  Med List Status: <None>  ? ?Medication Order Taking? Sig Documenting Provider Last Dose Status Informant  ?ACCU-CHEK GUIDE test strip [id number]  USE AS DIRECTED. UP TO 4 TIMES DAILY ONCE DAILY [name], Regina W, NP  Active   ?Accu-Chek Softclix Lancets lancets [id number]  USE AS DIRECTED. UP TO 4 TIMES A DAY [name], Regina W, NP  Active   ?Acetaminophen (TYLENOL 8 HOUR ARTHRITIS PAIN PO) [id number]  Take 2 capsules by mouth in the morning, at noon, in the evening, and at bedtime. [provider]  Active Multiple Informants  ?albuterol (VENTOLIN HFA) 108 (90 Base) MCG/ACT inhaler [id number]  INHALE 1-2 PUFFS BY MOUTH EVERY 6 HOURS AS NEEDED WHEEZING/ SHORTNESS OF BREATH [name], Regina W, NP  Active   ?aspirin EC 81 MG tablet [id number]  Take 1 tablet (81 mg total) by mouth daily. Swallow whole. [name], Regina W, NP  Active   ?atorvastatin (LIPITOR) 40 MG tablet [id number]  TAKE 1 TABLET BY MOUTH BEDTIME [name], Regina W, NP  Active   ?blood glucose meter kit and supplies KIT [id number]  Dispense based on patient and insurance preference. Use up to four times daily as directed. [name], Regina W, NP  Active   ?Blood Glucose Monitoring Suppl (ONETOUCH VERIO) w/Device KIT [id number]  USE AS DIRECTED Malfi, Nicole M, FNP  Active Multiple Informants  ?buPROPion (WELLBUTRIN XL) 150 MG 24 hr tablet [id number]  TAKE 1 TABLET BY MOUTH ONCE DAILY [name], Regina W, NP   Active   ?clopidogrel (PLAVIX) 75 MG tablet [id number]  TAKE 1 TABLET BY MOUTH ONCE DAILY [name], Regina W, NP  Active   ?cyclobenzaprine (FLEXERIL) 5 MG tablet [id number]  Take 1 tablet (5 mg total) by mouth 3 (three) times daily as needed for muscle spasms. [name], Regina W, NP  Active   ?docusate sodium (COLACE) 100 MG capsule [id number]  Take 1 capsule (100 mg total) by mouth 2 (two) times daily as needed for mild constipation. [name], Regina W, NP  Active   ?Lancet Devices (ONE TOUCH DELICA LANCING DEV) MISC [id number]  1 Device by Does not apply route 2 (two) times daily. Malfi, Nicole M, FNP  Active Multiple Informants  ?metFORMIN (GLUCOPHAGE) 500 MG tablet [id number] Yes TAKE 1 TABLET BY MOUTH TWICE DAILY WITH A MEAL [name], Regina W, NP Taking Active   ?traZODone (DESYREL) 50 MG tablet [id number]  Take 0.5-1 tablets (25-50 mg total) by mouth at bedtime as needed for sleep. [name], Regina W, NP  Active   ?TRELEGY ELLIPTA 100-62.5-25 MCG/ACT AEPB [id number]  USE ONE INHALATION INTO THE LUNGS ONCE A DAY RINSE MOUTH AFTER EACH USE [name], Regina W, NP  Active   ? ?  ?  ? ?  ? ? ?Pertinent Labs:  ?Lab Results  ?Component Value Date  ? HGBA1C 7.7 (H) 08/01/2021  ? ?Lab Results  ?Component Value Date  ? CHOL 135 08/01/2021  ? HDL 65 08/01/2021  ?   [place] 53 08/01/2021  ? TRIG 89 08/01/2021  ? CHOLHDL 2.1 08/01/2021  ? ?Lab Results  ?Component Value Date  ? CREATININE 0.82 10/06/2021  ? BUN 22 10/06/2021  ? NA 136 10/06/2021  ? K 3.8 10/06/2021  ? CL 101 10/06/2021  ? CO2 25 10/06/2021  ? ? ?SDOH:  (Social Determinants of Health) assessments and interventions performed:  ? ? ?CCM Care Plan ? ?Review of patient past medical history, allergies, medications, health status, including review of consultants reports, laboratory and other test data, was performed as part of comprehensive evaluation and provision of chronic care management services.  ? ?Care Plan : PharmD - Medication Mgmt  ?Updates made by Rennis Petty, RPH-CPP  since 12/16/2021 12:00 AM  ?  ? ?Problem: Disease Progression   ?  ? ?Long-Range Goal: Disease Progression Prevented or Minimized   ?Start Date: 11/01/2020  ?Expected End Date: 01/30/2021  ?Recent Progress: On track  ?Priority: High  ?Note:   ?Current Barriers:  ?Financial Barriers ?Limited social support ? ?Pharmacist Clinical Goal(s):  ?Over the next 90 days, patient will maintain adherence to monitoring guidelines and medication adherence to achieve therapeutic efficacy through collaboration with PharmD and provider.  ? ?Interventions: ?1:1 collaboration with Webb Silversmith, NP regarding development and update of comprehensive plan of care as evidenced by provider attestation and co-signature ?Inter-disciplinary care team collaboration (see longitudinal plan of care) ?Perform chart review. Note from chart/patient reports has missed follow up appointment with [place] this morning ?Today telephone conversation with patient is in and out - patient having difficulty with her phone, but reports will have new phone next week ?Reports she had a fall off of a stool last Friday evening. Reports hip feels bruised and right thumb still hurts. ?Reports would like to have PCP evaluate her hip and thumb. Will call office to schedule appointment ?Denies currently using cane or walker ?Again encourage patient to use cane or walker around home for support/fall prevention ?Have cautioned patient for risk of dizziness, sedation and falls with cyclobenzaprine and trazodone, particularly if taken in combination ?Counseled on using these medications only as needed and using the lowest dose needed ?Patient attributes fall to weakness in her left leg since stroke ? ?Medication Management/adherence: ?Reports continues to take medication from pill pack as packaged by [name] ?Have counseled patient on importance of using maintenance inhaler (Trelegy) daily and rescue inhaler (albuterol) as needed as directed and to  rinse out mouth after each use of Trelegy ? ?T2DM: ?Current treatment: metformin 500 mg twice daily ?Reports last checked fasting morning blood sugar on 3/28: 134 ?Denies symptoms of hypoglycemia ?Encourage patient to have regular well-balanced meals throughout the day, limiting carbohydrate portion sizes ?  ?Coordination of Care: ?Reports recently missed appointments due to transportation/trouble with her phone not consistently working. ?Reports she getting new telephone to help with her communication with medical offices/not missing appointments next week ?Today provide patient with phone number for [facility] to reschedule missed appointment ?Reports will call to reschedule, as well as call to arrange transportation for appointments ? ?Patient Goals/Self-Care Activities ?Over the next 90 days, patient will:  ?- take medications as prescribed ? Using pill packaging from [facility] ?- check glucose, document, and provide at future appointments ?- check blood pressure, document, and provide at future appointments ? ?Follow Up Plan: The care management team will reach out to the patient again over the next 30 days.   ? ?  ?  ? ?  Elisabeth Delles, PharmD, BCACP, CPP ?Clinical Pharmacist ?[facility] ?[place] ?[phone] ? ? ? ? ? ?

## 2021-12-17 ENCOUNTER — Encounter: Payer: Self-pay | Admitting: Oncology

## 2021-12-17 ENCOUNTER — Other Ambulatory Visit: Payer: Self-pay | Admitting: Internal Medicine

## 2021-12-19 ENCOUNTER — Encounter: Payer: Self-pay | Admitting: Oncology

## 2021-12-19 NOTE — Telephone Encounter (Signed)
Requested Prescriptions  ?Pending Prescriptions Disp Refills  ?? albuterol (VENTOLIN HFA) 108 (90 Base) MCG/ACT inhaler [Pharmacy Med Name: ALBUTEROL SULFATE HFA 108 (90 BASE)] 8.5 g 1  ?  Sig: INHALE 1-2 PUFFS BY MOUTH EVERY 6 HOURS AS NEEDED WHEEZING/ SHORTNESS OF BREATH  ?  ? Pulmonology:  Beta Agonists 2 Passed - 12/16/2021  2:29 PM  ?  ?  Passed - Last BP in normal range  ?  BP Readings from Last 1 Encounters:  ?10/06/21 121/73  ?   ?  ?  Passed - Last Heart Rate in normal range  ?  Pulse Readings from Last 1 Encounters:  ?10/06/21 85  ?   ?  ?  Passed - Valid encounter within last 12 months  ?  Recent Outpatient Visits   ?      ? 2 months ago Nonspecific chest pain  ? [facility] [address] [address], [place] W, NP  ? 2 months ago Chest pain, unspecified type  ? [facility] [place], [place] W, NP  ? 4 months ago Controlled type 2 diabetes mellitus with complication, without long-term current use of insulin ([place])  ? [facility] [place], [place] W, NP  ? 6 months ago Chronic right shoulder pain  ? [facility] [place], [place] W, NP  ? 6 months ago Aortic atherosclerosis [facility])  ? [facility] [place], [place], [place]  ?  ?  ? ?  ?  ?  ?? TRELEGY ELLIPTA 100-62.5-25 MCG/ACT AEPB [Pharmacy Med Name: TRELEGY ELLIPTA 100-62.5-25 MCG/ACT] 60 each 1  ?  Sig: USE ONE INHALATION INTO THE LUNGS ONCE A DAY RINSE MOUTH AFTER EACH USE  ?  ? There is no refill protocol information for this order  ?  ? ? ?

## 2021-12-19 NOTE — Telephone Encounter (Signed)
Duplicate request. ?Requested Prescriptions  ?Pending Prescriptions Disp Refills  ?? metFORMIN (GLUCOPHAGE) 500 MG tablet [Pharmacy Med Name: METFORMIN HCL 500 MG TAB] 180 tablet 0  ?  Sig: TAKE 1 TABLET BY MOUTH TWICE DAILY WITH A MEAL  ?  ? Endocrinology:  Diabetes - Biguanides Passed - 12/17/2021 10:03 AM  ?  ?  Passed - Cr in normal range and within 360 days  ?  Creat  ?Date Value Ref Range Status  ?08/01/2021 0.85 0.60 - 1.00 mg/dL Final  ? ?Creatinine, Ser  ?Date Value Ref Range Status  ?10/06/2021 0.82 0.44 - 1.00 mg/dL Final  ? ?Creatinine, Urine  ?Date Value Ref Range Status  ?05/31/2021 89 20 - 275 mg/dL Final  ?   ?  ?  Passed - HBA1C is between 0 and 7.9 and within 180 days  ?  Hemoglobin A1C  ?Date Value Ref Range Status  ?09/02/2014 9.5 (H) 4.2 - 6.3 % Final  ?  Comment:  ?  The American Diabetes Association recommends that a primary goal of ?therapy should be <7% and that physicians should reevaluate the ?treatment regimen in patients with HbA1c values consistently >8%. ?  ? ?Hgb A1c MFr Bld  ?Date Value Ref Range Status  ?08/01/2021 7.7 (H) <5.7 % of total Hgb Final  ?  Comment:  ?  For someone without known diabetes, a hemoglobin A1c ?value of 6.5% or greater indicates that they may have  ?diabetes and this should be confirmed with a follow-up  ?test. ?. ?For someone with known diabetes, a value <7% indicates  ?that their diabetes is well controlled and a value  ?greater than or equal to 7% indicates suboptimal  ?control. A1c targets should be individualized based on  ?duration of diabetes, age, comorbid conditions, and  ?other considerations. ?. ?Currently, no consensus exists regarding use of ?hemoglobin A1c for diagnosis of diabetes for children. ?. ?  ?   ?  ?  Passed - eGFR in normal range and within 360 days  ?  GFR, Est African American  ?Date Value Ref Range Status  ?06/14/2020 82 > OR = 60 mL/min/1.23m2 Final  ? ?GFR, Est Non African American  ?Date Value Ref Range Status  ?06/14/2020 70 > OR  = 60 mL/min/1.86m2 Final  ? ?GFR, Estimated  ?Date Value Ref Range Status  ?10/06/2021 >60 >60 mL/min Final  ?  Comment:  ?  (NOTE) ?Calculated using the CKD-EPI Creatinine Equation (2021) ?  ? ?eGFR  ?Date Value Ref Range Status  ?08/01/2021 72 > OR = 60 mL/min/1.38m2 Final  ?  Comment:  ?  The eGFR is based on the CKD-EPI 2021 equation. To calculate  ?the new eGFR from a previous Creatinine or Cystatin C ?result, go to https://www.kidney.org/professionals/ ?kdoqi/gfr%5Fcalculator ?  ?   ?  ?  Passed - B12 Level in normal range and within 720 days  ?  Vitamin B-12  ?Date Value Ref Range Status  ?06/06/2021 564 180 - 914 pg/mL Final  ?  Comment:  ?  (NOTE) ?This assay is not validated for testing neonatal or ?myeloproliferative syndrome specimens for Vitamin B12 levels. ?Performed at [facility], [place] [address] [place], [place] ?[address] ?  ?   ?  ?  Passed - Valid encounter within last 6 months  ?  Recent Outpatient Visits   ?      ? 2 months ago Nonspecific chest pain  ? [name] [place], Coralie Keens, NP  ?  2 months ago Chest pain, unspecified type  ? [name] [place], [place] W, NP  ? 4 months ago Controlled type 2 diabetes mellitus with complication, without long-term current use of insulin ([place])  ? [facility] [place], [place] W, NP  ? 6 months ago Chronic right shoulder pain  ? [name] [place], [place] W, NP  ? 6 months ago Aortic atherosclerosis [facility])  ? [facility] [place], [place] W, NP  ?  ?  ? ?  ?  ?  Passed - CBC within normal limits and completed in the last 12 months  ?  WBC  ?Date Value Ref Range Status  ?10/06/2021 5.3 4.0 - 10.5 K/uL Final  ? ?RBC  ?Date Value Ref Range Status  ?10/06/2021 3.53 (L) 3.87 - 5.11 MIL/uL Final  ? ?Hemoglobin  ?Date Value Ref Range Status  ?10/06/2021 11.4 (L) 12.0 - 15.0 g/dL Final  ? ?HGB  ?Date Value Ref Range Status  ?09/01/2014 12.7 12.0 - 16.0 g/dL Final  ? ?HCT  ?Date Value Ref  Range Status  ?10/06/2021 33.5 (L) 36.0 - 46.0 % Final  ?09/01/2014 37.9 35.0 - 47.0 % Final  ? ?MCHC  ?Date Value Ref Range Status  ?10/06/2021 34.0 30.0 - 36.0 g/dL Final  ? ?MCH  ?Date Value Ref Range Status  ?10/06/2021 32.3 26.0 - 34.0 pg Final  ? ?MCV  ?Date Value Ref Range Status  ?10/06/2021 94.9 80.0 - 100.0 fL Final  ?09/01/2014 92 80 - 100 fL Final  ? ?No results found for: PLTCOUNTKUC, LABPLAT, [place] ?RDW  ?Date Value Ref Range Status  ?10/06/2021 12.6 11.5 - 15.5 % Final  ?09/01/2014 13.9 11.5 - 14.5 % Final  ? ?  ?  ?  ? ? ?

## 2021-12-19 NOTE — Progress Notes (Deleted)
?Office Note  ? ? ? ?CC:  follow up ?Requesting Provider:  [name], NP ? ?HPI: Kendra [name] is a 76 y.o. ([birth year]) female who presents  for follow up of carotid artery disease. She is status post bilateral carotid endarterectomy.  She underwent right carotid endarterectomy on November 10, 2019 secondary to left-sided weakness.  She then underwent left carotid endarterectomy for asymptomatic disease on December 30, 2019 by Dr. Donzetta Matters.  She has remained asymptomatic at follow up since her surgeries.  ?  ?She denies episodes of monocular blindness, Amaurosis, slurred speech, facial drooping or unilateal upper or lower extremity weakness or  numbness.  She also states she is trying to quit smoking. She is compliant with her statin, aspirin and Plavix.  ?  ?The pt is on a statin for cholesterol management.  ?The pt is on a daily aspirin.   Other AC:  Plavix ?The pt not on medication for hypertension.   ?The pt is diabetic.  ?Tobacco hx: current smoker ? ? ?Past Medical History:  ?Diagnosis Date  ? Asthma   ? Back pain   ? Cataract   ? Cervical disc disorder   ? COPD (chronic obstructive pulmonary disease) ([place])   ? Depression   ? Diabetes ([place])   ? type II  ? Diabetic neuropathy ([place])   ? Dyspnea   ? with exertion  ? GERD (gastroesophageal reflux disease)   ? History of kidney stones   ? per patient "can't remember the year"  ? Hyperlipidemia   ? Hypertension   ? per patient "take meds for it"  ? Insomnia   ? Neuropathy   ? Osteoarthritis   ? Osteopenia   ? Pneumonia 2018  ? per patient  ? Reflux   ? Rheumatoid arthritis ([place])   ? Stroke [facility]) 2015  ? and again 2017  - Weakness in left leg, staggers w/ walking, vision   ? Tenosynovitis   ? ? ?Past Surgical History:  ?Procedure Laterality Date  ? BREAST BIOPSY Right   ? Fatty Tumor  ? ENDARTERECTOMY Right 10/21/2019  ? Procedure: ENDARTERECTOMY CAROTID RIGHT;  Surgeon: [name], MD;  Location: [facility];  Service: Vascular;  Laterality: Right;  ?  ENDARTERECTOMY Left 12/30/2019  ? Procedure: LEFT CAROTID ENDARTERECTOMY;  Surgeon: [name], MD;  Location: [facility];  Service: Vascular;  Laterality: Left;  ? NECK SURGERY    ? OVARIAN CYST REMOVAL    ? PATCH ANGIOPLASTY Right 10/21/2019  ? Procedure: Patch Angioplasty Using XenoSure Biologic Patch Right Carotid;  Surgeon: [name], MD;  Location: [facility];  Service: Vascular;  Laterality: Right;  ? PATCH ANGIOPLASTY Left 12/30/2019  ? Procedure: Patch Angioplasty Left Carotid;  Surgeon: [name], MD;  Location: [facility];  Service: Vascular;  Laterality: Left;  ? ? ?Social History  ? ?Socioeconomic History  ? Marital status: Widowed  ?  Spouse name: Not on file  ? Number of children: Not on file  ? Years of education: Not on file  ? Highest education level: Not on file  ?Occupational History  ? Not on file  ?Tobacco Use  ? Smoking status: Some Days  ?  Packs/day: 0.25  ?  Years: 61.00  ?  Pack years: 15.25  ?  Types: Cigarettes  ? Smokeless tobacco: Former  ?  Types: Snuff  ?Vaping Use  ? Vaping Use: Never used  ?Substance and Sexual Activity  ? Alcohol use: No  ? Drug  use: No  ? Sexual activity: Never  ?Other Topics Concern  ? Not on file  ?Social History Narrative  ? Not on file  ? ?Social Determinants of Health  ? ?Financial Resource Strain: Low Risk   ? Difficulty of Paying Living Expenses: Not very hard  ?Food Insecurity: No Food Insecurity  ? Worried About Charity fundraiser in the Last Year: Never true  ? Ran Out of Food in the Last Year: Never true  ?Transportation Needs: No Transportation Needs  ? Lack of Transportation (Medical): No  ? Lack of Transportation (Non-Medical): No  ?Physical Activity: Inactive  ? Days of Exercise per Week: 0 days  ? Minutes of Exercise per Session: 0 min  ?Stress: No Stress Concern Present  ? Feeling of Stress : Only a little  ?Social Connections: Socially Isolated  ? Frequency of Communication with Friends and Family: More than three  times a week  ? Frequency of Social Gatherings with Friends and Family: More than three times a week  ? Attends Religious Services: Never  ? Active Member of Clubs or Organizations: No  ? Attends Archivist Meetings: Never  ? Marital Status: Widowed  ?Intimate Partner Violence: Not At Risk  ? Fear of Current or Ex-Partner: No  ? Emotionally Abused: No  ? Physically Abused: No  ? Sexually Abused: No  ? ?*** ?Family History  ?Problem Relation Age of Onset  ? Stroke Father   ? Depression Father   ? Diabetes Mother   ? Hyperlipidemia Mother   ? Breast cancer Mother   ? Heart murmur Son   ? Heart murmur Son   ? Heart murmur Son   ? ? ?Current Outpatient Medications  ?Medication Sig Dispense Refill  ? ACCU-CHEK GUIDE test strip USE AS DIRECTED. UP TO 4 TIMES DAILY ONCE DAILY 400 each 1  ? Accu-Chek Softclix Lancets lancets USE AS DIRECTED. UP TO 4 TIMES A DAY 400 each 1  ? Acetaminophen (TYLENOL 8 HOUR ARTHRITIS PAIN PO) Take 2 capsules by mouth in the morning, at noon, in the evening, and at bedtime.    ? albuterol (VENTOLIN HFA) 108 (90 Base) MCG/ACT inhaler INHALE 1-2 PUFFS BY MOUTH EVERY 6 HOURS AS NEEDED WHEEZING/ SHORTNESS OF BREATH 8.5 g 1  ? aspirin EC 81 MG tablet Take 1 tablet (81 mg total) by mouth daily. Swallow whole. 90 tablet 1  ? atorvastatin (LIPITOR) 40 MG tablet TAKE 1 TABLET BY MOUTH BEDTIME 90 tablet 0  ? blood glucose meter kit and supplies KIT Dispense based on patient and insurance preference. Use up to four times daily as directed. 1 each 0  ? Blood Glucose Monitoring Suppl (ONETOUCH VERIO) w/Device KIT USE AS DIRECTED 1 kit 0  ? buPROPion (WELLBUTRIN XL) 150 MG 24 hr tablet TAKE 1 TABLET BY MOUTH ONCE DAILY 90 tablet 0  ? clopidogrel (PLAVIX) 75 MG tablet TAKE 1 TABLET BY MOUTH ONCE DAILY 90 tablet 0  ? cyclobenzaprine (FLEXERIL) 5 MG tablet Take 1 tablet (5 mg total) by mouth 3 (three) times daily as needed for muscle spasms. 30 tablet 2  ? docusate sodium (COLACE) 100 MG capsule Take  1 capsule (100 mg total) by mouth 2 (two) times daily as needed for mild constipation. 60 capsule 2  ? Lancet Devices (ONE TOUCH DELICA LANCING DEV) MISC 1 Device by Does not apply route 2 (two) times daily. 200 each 4  ? metFORMIN (GLUCOPHAGE) 500 MG tablet TAKE 1 TABLET BY MOUTH TWICE  DAILY WITH A MEAL 180 tablet 0  ? traZODone (DESYREL) 50 MG tablet Take 0.5-1 tablets (25-50 mg total) by mouth at bedtime as needed for sleep. 90 tablet 0  ? TRELEGY ELLIPTA 100-62.5-25 MCG/ACT AEPB USE ONE INHALATION INTO THE LUNGS ONCE A DAY RINSE MOUTH AFTER EACH USE 60 each 1  ? ?No current facility-administered medications for this visit.  ? ? ?Allergies  ?Allergen Reactions  ? Citalopram Nausea And Vomiting  ? ? ? ?REVIEW OF SYSTEMS:  ?*** ?$Remo'[X]'WvfqW$  denotes positive finding, $RemoveBeforeDEI'[ ]'qFcTRlpcGEyGDVxU$  denotes negative finding ?Cardiac  Comments:  ?Chest pain or chest pressure:    ?Shortness of breath upon exertion:    ?Short of breath when lying flat:    ?Irregular heart rhythm:    ?    ?Vascular    ?Pain in calf, thigh, or hip brought on by ambulation:    ?Pain in feet at night that wakes you up from your sleep:     ?Blood clot in your veins:    ?Leg swelling:     ?    ?Pulmonary    ?Oxygen at home:    ?Productive cough:     ?Wheezing:     ?    ?Neurologic    ?Sudden weakness in arms or legs:     ?Sudden numbness in arms or legs:     ?Sudden onset of difficulty speaking or slurred speech:    ?Temporary loss of vision in one eye:     ?Problems with dizziness:     ?    ?Gastrointestinal    ?Blood in stool:     ?Vomited blood:     ?    ?Genitourinary    ?Burning when urinating:     ?Blood in urine:    ?    ?Psychiatric    ?Major depression:     ?    ?Hematologic    ?Bleeding problems:    ?Problems with blood clotting too easily:    ?    ?Skin    ?Rashes or ulcers:    ?    ?Constitutional    ?Fever or chills:    ? ? ?PHYSICAL EXAMINATION: ? ?There were no vitals filed for this visit. ? ?General:  WDWN in NAD; vital signs documented above ?Gait: Not  observed ?HENT: WNL, normocephalic ?Pulmonary: normal non-labored breathing , without Rales, rhonchi,  wheezing ?Cardiac: {Desc; regular/irreg:14544} HR, without  Murmurs {With/Without:20273} carotid bruit*** ?Bryson Ha

## 2021-12-21 ENCOUNTER — Inpatient Hospital Stay (HOSPITAL_COMMUNITY): Admission: RE | Admit: 2021-12-21 | Payer: 59 | Source: Ambulatory Visit

## 2021-12-21 ENCOUNTER — Ambulatory Visit: Payer: 59

## 2021-12-21 ENCOUNTER — Encounter: Payer: Self-pay | Admitting: Oncology

## 2021-12-21 DIAGNOSIS — I6523 Occlusion and stenosis of bilateral carotid arteries: Secondary | ICD-10-CM

## 2021-12-26 ENCOUNTER — Telehealth: Payer: Self-pay

## 2021-12-26 ENCOUNTER — Telehealth: Payer: 59

## 2021-12-26 ENCOUNTER — Ambulatory Visit (INDEPENDENT_AMBULATORY_CARE_PROVIDER_SITE_OTHER): Payer: Medicare HMO

## 2021-12-26 DIAGNOSIS — M150 Primary generalized (osteo)arthritis: Secondary | ICD-10-CM

## 2021-12-26 DIAGNOSIS — E1169 Type 2 diabetes mellitus with other specified complication: Secondary | ICD-10-CM

## 2021-12-26 DIAGNOSIS — R296 Repeated falls: Secondary | ICD-10-CM

## 2021-12-26 DIAGNOSIS — F3341 Major depressive disorder, recurrent, in partial remission: Secondary | ICD-10-CM

## 2021-12-26 DIAGNOSIS — Z8709 Personal history of other diseases of the respiratory system: Secondary | ICD-10-CM

## 2021-12-26 DIAGNOSIS — J410 Simple chronic bronchitis: Secondary | ICD-10-CM

## 2021-12-26 DIAGNOSIS — I152 Hypertension secondary to endocrine disorders: Secondary | ICD-10-CM

## 2021-12-26 DIAGNOSIS — E118 Type 2 diabetes mellitus with unspecified complications: Secondary | ICD-10-CM

## 2021-12-26 DIAGNOSIS — M159 Polyosteoarthritis, unspecified: Secondary | ICD-10-CM

## 2021-12-26 DIAGNOSIS — E1159 Type 2 diabetes mellitus with other circulatory complications: Secondary | ICD-10-CM

## 2021-12-26 NOTE — Patient Instructions (Signed)
Visit Information ? ?Thank you for taking time to visit with me today. Please don't hesitate to contact me if I can be of assistance to you before our next scheduled telephone appointment. ? ?Following are the goals we discussed today:  ?RNCM Clinical Goal(s):  ?Patient will verbalize basic understanding of HTN, HLD, COPD, DMII, Depression, and Osteoarthritis disease process and self health management plan as evidenced by following plan of care, keeping appointments, calling the provider office for changes in conditions and working with the CCM team to optimize health and well being  ?take all medications exactly as prescribed and will call provider for medication related questions as evidenced by compliance with medications and calling the office for refill needs     ?attend all scheduled medical appointments: pcp appointments and specialist appointments as evidenced by keeping appointments and calling the office for needed schedule changes        ?continue to work with Consulting civil engineer and/or Social Worker to address care management and care coordination needs related to HTN, HLD, COPD, DMII, Depression, and Osteoarthritis as evidenced by adherence to CM Team Scheduled appointments     ?demonstrate a decrease in HTN, HLD, COPD, DMII, Depression, and Osteoarthritis exacerbations  as evidenced by following the plan of care and calling for any changes or acute exacerbations in chronic conditions  ?demonstrate ongoing self health care management ability for effective management of Chronic conditions  as evidenced by  working with the CCM team through collaboration with Consulting civil engineer, provider, and care team.  ?  ?Interventions: ?1:1 collaboration with primary care provider regarding development and update of comprehensive plan of care as evidenced by provider attestation and co-signature ?Inter-disciplinary care team collaboration (see longitudinal plan of care) ?Evaluation of current treatment plan related to  self  management and patient's adherence to plan as established by provider ?  ?  ?COPD: (Status: Goal on Track (progressing): YES.) Long Term Goal  ?Reviewed medications with patient, including use of prescribed maintenance and rescue inhalers, and provided instruction on medication management and the importance of adherence. 12-26-2021: The patient is compliant with medications. Denies any medications needs today ?Provided patient with basic written and verbal COPD education on self care/management/and exacerbation prevention. 10-10-2021: The patient was in the ER for evaluation of chest pain on 10-06-2021. The patient states that they told her she had "bronchitis" but did not give her any medications. The patient states she has a runny nose and the drainage is clear. Denies fever or chills, education and appointment secured for the patient to talk to pcp on 10-11-2021 via phone at 4 pm. Review and confirmed with the patient. 11-14-2021: The patient states she is doing better and denies any issues related to COPD or changes in her breathing. She has stopped smoking and continues to refrain from smoking. Praised the patient for positive changes made for her health and well being. The patient says it is hard not to smoke but she is dedicated to refrain from smoking. 12-26-2021: The patient denies any exacerbations with her COPD. States her condition is stable ?Advised patient to track and manage COPD triggers. 11-14-2021: Review with the patient and education provider on factors that can trigger COPD exacerbation. The patient states the cold and raining weather causes issues and she knows to monitor closely for changes in her condition. 12-26-2021: The patient states her COPD is stable at this time. Knows factors that trigger an exacerbation. ?Provided written and verbal instructions on pursed lip breathing and utilized  returned demonstration as teach back ?Provided instruction about proper use of medications used for management  of COPD including inhalers ?Advised patient to self assesses COPD action plan zone and make appointment with provider if in the yellow zone for 48 hours without improvement ?Advised patient to engage in light exercise as tolerated 3-5 days a week to aid in the the management of COPD ?Provided education about and advised patient to utilize infection prevention strategies to reduce risk of respiratory infection ?Discussed the importance of adequate rest and management of fatigue with COPD. 12-26-2021: Review and the patient is staying safe and denies any acute findings. Denies any issues with CP or respiratory conditions.  ?Screening for signs and symptoms of depression related to chronic disease state  ?Assessed social determinant of health barriers ?  ?Diabetes:  (Status: Goal on Track (progressing): YES.) Long Term Goal  ?  ?     ?Lab Results  ?Component Value Date  ?  HGBA1C 7.7 (H) 08/01/2021  ?Assessed patient's understanding of A1c goal: <7%. 08-08-2021: States her A1C was elevated this time. Review of steroid shots may have caused some of the reason for elevation. The patient states she did not have her paper with her and is having to write down her numbers. Will mail the patient more sheets to record blood pressures and blood sugars. 11-14-2021: Denies any acute findings related to DM. Gave the RNCM several readings related to DM management.  ?Provided education to patient about basic DM disease process; ?Reviewed medications with patient and discussed importance of medication adherence. 08-08-2021: The patient has started back on Metformin 500 mg BID, endorses compliance. Denies any issues.  12-26-2021: The patient is compliant with medications, denies any acute findings.     ?Reviewed prescribed diet with patient heart healthy/ADA- 12-26-2021: The patient is compliant with heart healthy/ADA diet ; ?Counseled on importance of regular laboratory monitoring as prescribed. 12-26-2021: The patient is compliant with  getting regular lab work for evaluation;        ?Discussed plans with patient for ongoing care management follow up and provided patient with direct contact information for care management team.  ?Provided patient with written educational materials related to hypo and hyperglycemia and importance of correct treatment. 10-10-2021: Denies any lows but states she knows how to monitor and treat . 11-14-2021: States compliance with checking blood sugars. Denies any lows and states the lowest she has seen is 92. Highest is 243 and that was an isolated incident. She is mindful of her blood sugar readings and does not want to go back on the "needle". Education and support given.   12-26-2021: The patient states her blood sugars have been good. Did not provide readings today. Last one on 12-13-2021 was 134 fasting. The patient was a little stressed today over transportation needs. Education and support provided.   ?Reviewed scheduled/upcoming provider appointments including: States that she needs to cancel her appointment for 12-29-2021 because she does not have transportation . She changed insurance carriers and will have to wait until May 1st for transportation. Information sent to the manager of the office and the [place] manager.  ?Advised patient, providing education and rationale, to check cbg before meals and at bedtime, when you have symptoms of low or high blood sugar, and before and after exercise and record. 10-10-2021: The patient takes blood sugars and records. Denies any acute findings.   11-14-2021: Takes readings on a consistent basis. See chart below for most recent readings:    ?BS readings        ?  10/26/2021 92 11/07/2021 115  ?10/30/2021 135 11/08/2021 139  ?10/31/2021 108 11/09/2021 120  ?11/01/2021 243 11/10/2021 97  ?11/03/2021 108 11/11/2021 120  ?11/04/2021 115 11/12/2021 121  ?11/06/2021 179 11/13/2021 141 ?   ?12-26-2021: Did not provide readings today as she was stressed out. Last known reading was 134 on 12-13-2021 ?call  provider for findings outside established parameters;       ?Review of patient status, including review of consultants reports, relevant laboratory and other test results, and medications completed;

## 2021-12-26 NOTE — Telephone Encounter (Signed)
Copied from [place] [id number]. Topic: Appointment Scheduling - Scheduling Inquiry for Clinic ?>> Dec 26, 2021 12:20 PM Pawlus, Brayton Layman A wrote: ?Reason for CRM: Pt called in and wanted to re-schedule her in person [facility] appt on 4/13 due to transportation, please advise. ?

## 2021-12-26 NOTE — Chronic Care Management (AMB) (Signed)
?Chronic Care Management  ? ?CCM RN Visit Note ? ?12/26/2021 ?Name: Kendra [name] MRN: [id number] DOB: Nov 13, 1945 ? ?Subjective: ?Kendra [name] is a 76 y.o. year old female who is a primary care patient of [name], NP. The care management team was consulted for assistance with disease management and care coordination needs.   ? ?Engaged with patient by telephone for follow up visit in response to provider referral for case management and/or care coordination services.  ? ?Consent to Services:  ?The patient was given information about Chronic Care Management services, agreed to services, and gave verbal consent prior to initiation of services.  Please see initial visit note for detailed documentation.  ? ?Patient agreed to services and verbal consent obtained.  ? ?Assessment: Review of patient past medical history, allergies, medications, health status, including review of consultants reports, laboratory and other test data, was performed as part of comprehensive evaluation and provision of chronic care management services.  ? ?SDOH (Social Determinants of Health) assessments and interventions performed:   ? ?CCM Care Plan ? ?Allergies  ?Allergen Reactions  ? Citalopram Nausea And Vomiting  ? ? ?Outpatient Encounter Medications as of 12/26/2021  ?Medication Sig  ? ACCU-CHEK GUIDE test strip USE AS DIRECTED. UP TO 4 TIMES DAILY ONCE DAILY  ? Accu-Chek Softclix Lancets lancets USE AS DIRECTED. UP TO 4 TIMES A DAY  ? Acetaminophen (TYLENOL 8 HOUR ARTHRITIS PAIN PO) Take 2 capsules by mouth in the morning, at noon, in the evening, and at bedtime.  ? albuterol (VENTOLIN HFA) 108 (90 Base) MCG/ACT inhaler INHALE 1-2 PUFFS BY MOUTH EVERY 6 HOURS AS NEEDED WHEEZING/ SHORTNESS OF BREATH  ? aspirin EC 81 MG tablet Take 1 tablet (81 mg total) by mouth daily. Swallow whole.  ? atorvastatin (LIPITOR) 40 MG tablet TAKE 1 TABLET BY MOUTH BEDTIME  ? blood glucose meter kit and supplies KIT Dispense based on patient and  insurance preference. Use up to four times daily as directed.  ? Blood Glucose Monitoring Suppl (ONETOUCH VERIO) w/Device KIT USE AS DIRECTED  ? buPROPion (WELLBUTRIN XL) 150 MG 24 hr tablet TAKE 1 TABLET BY MOUTH ONCE DAILY  ? clopidogrel (PLAVIX) 75 MG tablet TAKE 1 TABLET BY MOUTH ONCE DAILY  ? cyclobenzaprine (FLEXERIL) 5 MG tablet Take 1 tablet (5 mg total) by mouth 3 (three) times daily as needed for muscle spasms.  ? docusate sodium (COLACE) 100 MG capsule Take 1 capsule (100 mg total) by mouth 2 (two) times daily as needed for mild constipation.  ? Lancet Devices (ONE TOUCH DELICA LANCING DEV) MISC 1 Device by Does not apply route 2 (two) times daily.  ? metFORMIN (GLUCOPHAGE) 500 MG tablet TAKE 1 TABLET BY MOUTH TWICE DAILY WITH A MEAL  ? traZODone (DESYREL) 50 MG tablet Take 0.5-1 tablets (25-50 mg total) by mouth at bedtime as needed for sleep.  ? TRELEGY ELLIPTA 100-62.5-25 MCG/ACT AEPB USE ONE INHALATION INTO THE LUNGS ONCE A DAY RINSE MOUTH AFTER EACH USE  ? ?No facility-administered encounter medications on file as of 12/26/2021.  ? ? ?Patient Active Problem List  ? Diagnosis Date Noted  ? Aortic atherosclerosis ([place]) 05/31/2021  ? COPD (chronic obstructive pulmonary disease) ([place]) 05/31/2021  ? Anemia 05/31/2021  ? Hemiparesis affecting left side as late effect of stroke ([place]) 02/02/2021  ? Recurrent major depressive disorder, in partial remission ([place]) 02/02/2021  ? Carotid artery stenosis 12/30/2019  ? Hypertension associated with diabetes ([place]) 12/19/2018  ? Diabetes mellitus type 2,  controlled, with complications ([place]) 81/09/7508  ? Insomnia 03/30/2015  ? Hyperlipidemia associated with type 2 diabetes mellitus ([place]) 03/30/2015  ? Gastro-esophageal reflux disease without esophagitis 03/30/2015  ? Osteoarthritis 03/30/2015  ? Osteopenia 03/30/2015  ? B12 deficiency 03/30/2015  ? ? ?Conditions to be addressed/monitored:HTN, HLD, COPD, DMII, Depression, Osteoarthritis, and falls ? ?Care Plan : RNCM:  General Plan of Care (Adult) for Chronic Disease Management and Care Coordination Needs  ?Updates made by Vanita Ingles, RN since 12/26/2021 12:00 AM  ?  ? ?Problem: RNCM: Development of Plan of Care For Chronic Disease Management (HTN, HLD, DM, OA, COPD, Depression)   ?Priority: High  ?  ? ?Long-Range Goal: RNCM: Effective Management  of Plan of Care For Chronic Disease Management (HTN, HLD, DM, OA, COPD, Depression)   ?Start Date: 08/08/2021  ?Expected End Date: 08/08/2022  ?Priority: High  ?Note:   ?Current Barriers:  ?Knowledge Deficits related to plan of care for management of HTN, HLD, COPD, DMII, Depression: depressed mood ?anxiety, and chronic pain with OA to right shoulder  ?Care Coordination needs related to Limited social support, Mental Health Concerns , and Literacy concerns  ?Chronic Disease Management support and education needs related to HTN, HLD, COPD, DMII, Depression: depressed mood ?anxiety, and Chronic Pain/OA ?Lacks caregiver support.        ? ?RNCM Clinical Goal(s):  ?Patient will verbalize basic understanding of HTN, HLD, COPD, DMII, Depression, and Osteoarthritis disease process and self health management plan as evidenced by following plan of care, keeping appointments, calling the provider office for changes in conditions and working with the CCM team to optimize health and well being  ?take all medications exactly as prescribed and will call provider for medication related questions as evidenced by compliance with medications and calling the office for refill needs     ?attend all scheduled medical appointments: pcp appointments and specialist appointments as evidenced by keeping appointments and calling the office for needed schedule changes        ?continue to work with Consulting civil engineer and/or Social Worker to address care management and care coordination needs related to HTN, HLD, COPD, DMII, Depression, and Osteoarthritis as evidenced by adherence to CM Team Scheduled appointments      ?demonstrate a decrease in HTN, HLD, COPD, DMII, Depression, and Osteoarthritis exacerbations  as evidenced by following the plan of care and calling for any changes or acute exacerbations in chronic conditions  ?demonstrate ongoing self health care management ability for effective management of Chronic conditions  as evidenced by  working with the CCM team through collaboration with Consulting civil engineer, provider, and care team.  ? ?Interventions: ?1:1 collaboration with primary care provider regarding development and update of comprehensive plan of care as evidenced by provider attestation and co-signature ?Inter-disciplinary care team collaboration (see longitudinal plan of care) ?Evaluation of current treatment plan related to  self management and patient's adherence to plan as established by provider ? ? ?COPD: (Status: Goal on Track (progressing): YES.) Long Term Goal  ?Reviewed medications with patient, including use of prescribed maintenance and rescue inhalers, and provided instruction on medication management and the importance of adherence. 12-26-2021: The patient is compliant with medications. Denies any medications needs today ?Provided patient with basic written and verbal COPD education on self care/management/and exacerbation prevention. 10-10-2021: The patient was in the ER for evaluation of chest pain on 10-06-2021. The patient states that they told her she had "bronchitis" but did not give her any medications. The patient states  she has a runny nose and the drainage is clear. Denies fever or chills, education and appointment secured for the patient to talk to pcp on 10-11-2021 via phone at 4 pm. Review and confirmed with the patient. 11-14-2021: The patient states she is doing better and denies any issues related to COPD or changes in her breathing. She has stopped smoking and continues to refrain from smoking. Praised the patient for positive changes made for her health and well being. The patient says it  is hard not to smoke but she is dedicated to refrain from smoking. 12-26-2021: The patient denies any exacerbations with her COPD. States her condition is stable ?Advised patient to track and manage COP

## 2021-12-26 NOTE — Telephone Encounter (Signed)
?  Care Management  ? ?Follow Up Note ? ? ?12/26/2021 ?Name: MARIANNA CID MRN: [id number] DOB: Aug 28, 1946 ? ? ?Referred by: [name], NP ?Reason for referral : Chronic Care Management (RNCM: Follow up for Chronic Disease Management and Care Coordination Needs) ? ? ?Call returned to the patient at a different time and the call was completed. See new encounter.  ? ?Follow Up Plan: Telephone follow up appointment with care management team member scheduled for: 01-30-2022 at 1145 am ? ?[name] RN, MSN, CCM ?Community Care Coordinator ?[facility] ?Mobile: [phone]  ?

## 2021-12-26 NOTE — Telephone Encounter (Signed)
I attempted to contact the patient several times, no answer. VM full and cannot accept any messages at this time. If the patient calls please give the patient the option to change the visit over to a virtual telephone visit.  ?

## 2021-12-27 ENCOUNTER — Inpatient Hospital Stay: Payer: Medicare HMO

## 2021-12-29 ENCOUNTER — Encounter: Payer: Self-pay | Admitting: Oncology

## 2021-12-30 ENCOUNTER — Inpatient Hospital Stay: Payer: Medicare HMO

## 2021-12-30 ENCOUNTER — Inpatient Hospital Stay: Payer: Medicare HMO | Attending: Oncology

## 2022-01-03 ENCOUNTER — Inpatient Hospital Stay: Payer: Medicare HMO

## 2022-01-03 ENCOUNTER — Inpatient Hospital Stay: Payer: Medicare HMO | Admitting: Oncology

## 2022-01-04 ENCOUNTER — Ambulatory Visit (INDEPENDENT_AMBULATORY_CARE_PROVIDER_SITE_OTHER): Payer: Medicare HMO

## 2022-01-04 VITALS — BP 117/76 | HR 97

## 2022-01-04 DIAGNOSIS — Z0000 Encounter for general adult medical examination without abnormal findings: Secondary | ICD-10-CM

## 2022-01-04 DIAGNOSIS — Z135 Encounter for screening for eye and ear disorders: Secondary | ICD-10-CM | POA: Diagnosis not present

## 2022-01-04 NOTE — Progress Notes (Signed)
? ?I connected with  Callan Yontz today by telephone and verified that I am speaking with the correct person using two identifiers. ?Location patient: home ?Location provider: work ?Persons participating in the virtual visit: [place], [place], [place] ?  ?I discussed the limitations, risks, security and privacy concerns of performing an evaluation and management service by telephone and the availability of in person appointments. I also discussed with the patient that there may be a patient responsible charge related to this service. The patient expressed understanding and verbally consented to this telephonic visit.  ?  ? ? ?Subjective: ?  ? Kendra [name] is a 76 y.o. female who presents for Medicare Annual (Subsequent) preventive examination. ?Per HPI unless specifically indicated below  ?Review of Systems    ?Review of Systems  ?Musculoskeletal:  Positive for joint swelling and myalgias.  Rt thumb pain and swelling , Rt buttock pain. The pt report falling off of her bedside commode on to her buttock on 12/24/2021. She tried to brace her fall with her Right hand and bent her Rt thumb falling   ?Cardiac Risk Factors include: advanced age (>45men, >52 women);diabetes mellitus;dyslipidemia;sedentary lifestyle;smoking/ tobacco exposure ? ?   ?Objective:  ?  ?Today's Vitals  ? 01/04/22 1421 01/04/22 1423  ?BP: 117/76   ?Pulse: 97   ?PainSc:  8   ? ?There is no height or weight on file to calculate BMI. ? ? ?  10/06/2021  ? 12:38 PM 06/28/2021  ?  1:11 PM 02/08/2021  ?  1:11 PM 11/18/2020  ?  8:08 PM 10/01/2020  ?  2:17 PM 08/20/2020  ?  8:59 AM 07/20/2020  ?  9:38 AM  ?Advanced Directives  ?Does Patient Have a Medical Advance Directive? No No No No No No No  ?Would patient like information on creating a medical advance directive? No - Patient declined   No - Patient declined  No - Patient declined No - Patient declined  ? ? ?Current Medications (verified) ?Outpatient Encounter Medications as of 01/04/2022   ?Medication Sig  ? ACCU-CHEK GUIDE test strip USE AS DIRECTED. UP TO 4 TIMES DAILY ONCE DAILY  ? Accu-Chek Softclix Lancets lancets USE AS DIRECTED. UP TO 4 TIMES A DAY  ? Acetaminophen (TYLENOL 8 HOUR ARTHRITIS PAIN PO) Take 2 capsules by mouth in the morning, at noon, in the evening, and at bedtime.  ? albuterol (VENTOLIN HFA) 108 (90 Base) MCG/ACT inhaler INHALE 1-2 PUFFS BY MOUTH EVERY 6 HOURS AS NEEDED WHEEZING/ SHORTNESS OF BREATH  ? aspirin EC 81 MG tablet Take 1 tablet (81 mg total) by mouth daily. Swallow whole.  ? atorvastatin (LIPITOR) 40 MG tablet TAKE 1 TABLET BY MOUTH BEDTIME  ? blood glucose meter kit and supplies KIT Dispense based on patient and insurance preference. Use up to four times daily as directed.  ? Blood Glucose Monitoring Suppl (ONETOUCH VERIO) w/Device KIT USE AS DIRECTED  ? buPROPion (WELLBUTRIN XL) 150 MG 24 hr tablet TAKE 1 TABLET BY MOUTH ONCE DAILY  ? clopidogrel (PLAVIX) 75 MG tablet TAKE 1 TABLET BY MOUTH ONCE DAILY  ? docusate sodium (COLACE) 100 MG capsule Take 1 capsule (100 mg total) by mouth 2 (two) times daily as needed for mild constipation.  ? Lancet Devices (ONE TOUCH DELICA LANCING DEV) MISC 1 Device by Does not apply route 2 (two) times daily.  ? metFORMIN (GLUCOPHAGE) 500 MG tablet TAKE 1 TABLET BY MOUTH TWICE DAILY WITH A MEAL  ? traZODone (DESYREL) 50  MG tablet Take 0.5-1 tablets (25-50 mg total) by mouth at bedtime as needed for sleep.  ? TRELEGY ELLIPTA 100-62.5-25 MCG/ACT AEPB USE ONE INHALATION INTO THE LUNGS ONCE A DAY RINSE MOUTH AFTER EACH USE  ? cyclobenzaprine (FLEXERIL) 5 MG tablet Take 1 tablet (5 mg total) by mouth 3 (three) times daily as needed for muscle spasms. (Patient not taking: Reported on 01/04/2022)  ? ?No facility-administered encounter medications on file as of 01/04/2022.  ? ? ?Allergies (verified) ?Citalopram  ? ?History: ?Past Medical History:  ?Diagnosis Date  ? Asthma   ? Back pain   ? Cataract   ? Cervical disc disorder   ? COPD (chronic  obstructive pulmonary disease) ([place])   ? Depression   ? Diabetes ([place])   ? type II  ? Diabetic neuropathy ([place])   ? Dyspnea   ? with exertion  ? GERD (gastroesophageal reflux disease)   ? History of kidney stones   ? per patient "can't remember the year"  ? Hyperlipidemia   ? Hypertension   ? per patient "take meds for it"  ? Insomnia   ? Neuropathy   ? Osteoarthritis   ? Osteopenia   ? Pneumonia 2018  ? per patient  ? Reflux   ? Rheumatoid arthritis ([place])   ? Stroke [facility]) 2015  ? and again 2017  - Weakness in left leg, staggers w/ walking, vision   ? Tenosynovitis   ? ?Past Surgical History:  ?Procedure Laterality Date  ? BREAST BIOPSY Right   ? Fatty Tumor  ? ENDARTERECTOMY Right 10/21/2019  ? Procedure: ENDARTERECTOMY CAROTID RIGHT;  Surgeon: [name], MD;  Location: [facility];  Service: Vascular;  Laterality: Right;  ? ENDARTERECTOMY Left 12/30/2019  ? Procedure: LEFT CAROTID ENDARTERECTOMY;  Surgeon: [name], MD;  Location: [place];  Service: Vascular;  Laterality: Left;  ? NECK SURGERY    ? OVARIAN CYST REMOVAL    ? PATCH ANGIOPLASTY Right 10/21/2019  ? Procedure: Patch Angioplasty Using XenoSure Biologic Patch Right Carotid;  Surgeon: [name], MD;  Location: [place];  Service: Vascular;  Laterality: Right;  ? PATCH ANGIOPLASTY Left 12/30/2019  ? Procedure: Patch Angioplasty Left Carotid;  Surgeon: [name], MD;  Location: [place];  Service: Vascular;  Laterality: Left;  ? ?Family History  ?Problem Relation Age of Onset  ? Stroke Father   ? Depression Father   ? Diabetes Mother   ? Hyperlipidemia Mother   ? Breast cancer Mother   ? Heart murmur Son   ? Heart murmur Son   ? Heart murmur Son   ? ?Social History  ? ?Socioeconomic History  ? Marital status: Widowed  ?  Spouse name: Not on file  ? Number of children: 3  ? Years of education: 41  ? Highest education level: Not on file  ?Occupational History  ? Occupation: Disabled  ?Tobacco Use  ? Smoking status:  Some Days  ?  Packs/day: 0.25  ?  Years: 61.00  ?  Pack years: 15.25  ?  Types: Cigarettes  ? Smokeless tobacco: Former  ?  Types: Snuff  ?Vaping Use  ? Vaping Use: Never used  ?Substance and Sexual Activity  ? Alcohol use: No  ? Drug use: No  ? Sexual activity: Never  ?Other Topics Concern  ? Not on file  ?Social History Narrative  ? Not on file  ? ?Social Determinants of Health  ? ?Financial Resource Strain: Medium Risk  ? Difficulty  of Paying Living Expenses: Somewhat hard  ?Food Insecurity: No Food Insecurity  ? Worried About Charity fundraiser in the Last Year: Never true  ? Ran Out of Food in the Last Year: Never true  ?Transportation Needs: No Transportation Needs  ? Lack of Transportation (Medical): No  ? Lack of Transportation (Non-Medical): No  ?Physical Activity: Inactive  ? Days of Exercise per Week: 0 days  ? Minutes of Exercise per Session: 0 min  ?Stress: Stress Concern Present  ? Feeling of Stress : Rather much  ?Social Connections: Socially Isolated  ? Frequency of Communication with Friends and Family: More than three times a week  ? Frequency of Social Gatherings with Friends and Family: More than three times a week  ? Attends Religious Services: Never  ? Active Member of Clubs or Organizations: No  ? Attends Archivist Meetings: Never  ? Marital Status: Widowed  ? ? ?Tobacco Counseling ?Ready to quit: Not Answered ?Counseling given: Not Answered ? ? ?Clinical Intake: ? ?  ? ?Pain : 0-10 ?Pain Score: 8  ?Pain Type: Acute pain ?Pain Location: Buttocks ?Pain Descriptors / Indicators: Throbbing ?Pain Onset: 1 to 4 weeks ago ?Pain Frequency: Occasional ? ?  ? ?Nutritional Status: BMI of 19-24  Normal ?Nutritional Risks: Unintentional weight loss ?Diabetes: Yes ?CBG done?: No ?Did pt. bring in CBG monitor from home?: No ? ?How often do you need to have someone help you when you read instructions, pamphlets, or other written materials from your doctor or pharmacy?: 1 - Never ? ?Diabetic?  Yes ? ?Interpreter Needed?: No ? ?  ? ? ?Activities of Daily Living ? ?  01/04/2022  ?  2:30 PM  ?In your present state of health, do you have any difficulty performing the following activities:  ?Hearing? 0

## 2022-01-04 NOTE — Patient Instructions (Signed)
Fall Prevention in the Home, Adult ?Falls can cause injuries and can happen to people of all ages. There are many things you can do to make your home safe and to help prevent falls. Ask for help when making these changes. ?What actions can I take to prevent falls? ?General Instructions ?Use good lighting in all rooms. Replace any light bulbs that burn out. ?Turn on the lights in dark areas. Use night-lights. ?Keep items that you use often in easy-to-reach places. Lower the shelves around your home if needed. ?Set up your furniture so you have a clear path. Avoid moving your furniture around. ?Do not have throw rugs or other things on the floor that can make you trip. ?Avoid walking on wet floors. ?If any of your floors are uneven, fix them. ?Add color or contrast paint or tape to clearly mark and help you see: ?Grab bars or handrails. ?First and last steps of staircases. ?Where the edge of each step is. ?If you use a stepladder: ?Make sure that it is fully opened. Do not climb a closed stepladder. ?Make sure the sides of the stepladder are locked in place. ?Ask someone to hold the stepladder while you use it. ?Know where your pets are when moving through your home. ?What can I do in the bathroom? ? ?  ? ?Keep the floor dry. Clean up any water on the floor right away. ?Remove soap buildup in the tub or shower. ?Use nonskid mats or decals on the floor of the tub or shower. ?Attach bath mats securely with double-sided, nonslip rug tape. ?If you need to sit down in the shower, use a plastic, nonslip stool. ?Install grab bars by the toilet and in the tub and shower. Do not use towel bars as grab bars. ?What can I do in the bedroom? ?Make sure that you have a light by your bed that is easy to reach. ?Do not use any sheets or blankets for your bed that hang to the floor. ?Have a firm chair with side arms that you can use for support when you get dressed. ?What can I do in the kitchen? ?Clean up any spills right away. ?If  you need to reach something above you, use a step stool with a grab bar. ?Keep electrical cords out of the way. ?Do not use floor polish or wax that makes floors slippery. ?What can I do with my stairs? ?Do not leave any items on the stairs. ?Make sure that you have a light switch at the top and the bottom of the stairs. ?Make sure that there are handrails on both sides of the stairs. Fix handrails that are broken or loose. ?Install nonslip stair treads on all your stairs. ?Avoid having throw rugs at the top or bottom of the stairs. ?Choose a carpet that does not hide the edge of the steps on the stairs. ?Check carpeting to make sure that it is firmly attached to the stairs. Fix carpet that is loose or worn. ?What can I do on the outside of my home? ?Use bright outdoor lighting. ?Fix the edges of walkways and driveways and fix any cracks. ?Remove anything that might make you trip as you walk through a door, such as a raised step or threshold. ?Trim any bushes or trees on paths to your home. ?Check to see if handrails are loose or broken and that both sides of all steps have handrails. ?Install guardrails along the edges of any raised decks and porches. ?Clear paths of anything  that can make you trip, such as tools or rocks. ?Have leaves, snow, or ice cleared regularly. ?Use sand or salt on paths during winter. ?Clean up any spills in your garage right away. This includes grease or oil spills. ?What other actions can I take? ?Wear shoes that: ?Have a low heel. Do not wear high heels. ?Have rubber bottoms. ?Feel good on your feet and fit well. ?Are closed at the toe. Do not wear open-toe sandals. ?Use tools that help you move around if needed. These include: ?Canes. ?Walkers. ?Scooters. ?Crutches. ?Review your medicines with your doctor. Some medicines can make you feel dizzy. This can increase your chance of falling. ?Ask your doctor what else you can do to help prevent falls. ?Where to find more information ?Centers  for Disease Control and Prevention, [employer]AD[employer]: http://www.wolf.info/ ?[employer] on Aging: http://kim-miller.com/ ?Contact a doctor if: ?You are afraid of falling at home. ?You feel weak, drowsy, or dizzy at home. ?You fall at home. ?Summary ?There are many simple things that you can do to make your home safe and to help prevent falls. ?Ways to make your home safe include removing things that can make you trip and installing grab bars in the bathroom. ?Ask for help when making these changes in your home. ?This information is not intended to replace advice given to you by your health care provider. Make sure you discuss any questions you have with your health care provider. ?Document Revised: 06/06/2021 Document Reviewed: 04/07/2020 ?Elsevier Patient Education ? [place]. ? ?Steps to Quit Smoking ?Smoking tobacco is the leading cause of preventable death. It can affect almost every organ in the body. Smoking puts you and those around you at risk for developing many serious chronic diseases. ?Quitting smoking can be very challenging. Do not get discouraged if you are not successful the first time. Some people need to make many attempts to quit before they achieve long-term success. Do your best to stick to your quit plan, and talk with your health care provider if you have any questions or concerns. ?How do I get ready to quit? ?When you decide to quit smoking, create a plan to help you succeed. Before you quit: ?Pick a date to quit. Set a date within the next 2 weeks to give you time to prepare. ?Write down the reasons why you are quitting. Keep this list in places where you will see it often. ?Tell your family, friends, and co-workers that you are quitting. Support from people you are close to can make quitting easier. ?Talk with your health care provider about your options for quitting smoking. ?Find out what treatment options are covered by your health insurance. ?Identify people, places, things, and activities that  make you want to smoke (triggers). Avoid them. ?What first steps can I take to quit smoking? ?Throw away all cigarettes at home, at work, and in your car. ?Throw away smoking accessories, such as Scientist, research (medical). ?Clean your car. Make sure to empty the ashtray. ?Clean your home, including curtains and carpets. ?What strategies can I use to quit smoking? ?Talk with your health care provider about combining strategies, such as taking medicines while you are also receiving in-person counseling. Using these two strategies together makes you more likely to succeed in quitting than if you used either strategy on its own. ?If you are pregnant or breastfeeding, talk with your health care provider about finding counseling or other support strategies to quit smoking. Do not take medicine to help you  quit smoking unless your health care provider tells you to. ?Quit right away ?Quit smoking completely, instead of gradually reducing how much you smoke over a period of time. Stopping smoking right away may be more successful than gradually quitting. ?Attend in-person counseling to help you build problem-solving skills. You are more likely to succeed in quitting if you attend counseling sessions regularly. Even short sessions of 10 minutes can be effective. ?Take medicine ?You may take medicines to help you quit smoking. Some medicines require a prescription. You can also purchase over-the-counter medicines. Medicines may have nicotine in them to replace the nicotine in cigarettes. Medicines may: ?Help to stop cravings. ?Help to relieve withdrawal symptoms. ?Your health care provider may recommend: ?Nicotine patches, gum, or lozenges. ?Nicotine inhalers or sprays. ?Non-nicotine medicine that you take by mouth. ?Find resources ?Find resources and support systems that can help you quit smoking and remain smoke-free after you quit. These resources are most helpful when you use them often. They include: ?Online chats with a  Social worker. ?Telephone quitlines. ?Careers information officer. ?Support groups or group counseling. ?Text messaging programs. ?Mobile phone apps or applications. Use apps that can help you stick to your q

## 2022-01-10 ENCOUNTER — Other Ambulatory Visit: Payer: Self-pay | Admitting: Internal Medicine

## 2022-01-10 DIAGNOSIS — G4700 Insomnia, unspecified: Secondary | ICD-10-CM

## 2022-01-10 DIAGNOSIS — E785 Hyperlipidemia, unspecified: Secondary | ICD-10-CM

## 2022-01-11 ENCOUNTER — Other Ambulatory Visit: Payer: Self-pay | Admitting: Internal Medicine

## 2022-01-11 NOTE — Telephone Encounter (Signed)
Pt called to check on her refill status/ please advise  ?

## 2022-01-12 NOTE — Telephone Encounter (Signed)
Filled 12/14/21 #180. ?Requested Prescriptions  ?Refused Prescriptions Disp Refills  ?? metFORMIN (GLUCOPHAGE) 500 MG tablet [Pharmacy Med Name: METFORMIN HCL 500 MG TAB] 180 tablet 0  ?  Sig: TAKE 1 TABLET BY MOUTH TWICE DAILY WITH A MEAL  ?  ? Endocrinology:  Diabetes - Biguan[place] Passed - 01/11/2022  4:25 PM  ?  ?  Passed - Cr in normal range and within 360 days  ?  Creat  ?Date Value Ref Range Status  ?08/01/2021 0.85 0.60 - 1.00 mg/dL Final  ? ?Creatinine, Ser  ?Date Value Ref Range Status  ?10/06/2021 0.82 0.44 - 1.00 mg/dL Final  ? ?Creatinine, Urine  ?Date Value Ref Range Status  ?05/31/2021 89 20 - 275 mg/dL Final  ?   ?  ?  Passed - HBA1C is between 0 and 7.9 and within 180 days  ?  Hemoglobin A1C  ?Date Value Ref Range Status  ?09/02/2014 9.5 (H) 4.2 - 6.3 % Final  ?  Comment:  ?  The American Diabetes Association recommends that a primary goal of ?therapy should be <7% and that physicians should reevaluate the ?treatment regimen in patients with HbA1c values consistently >8%. ?  ? ?Hgb A1c MFr Bld  ?Date Value Ref Range Status  ?08/01/2021 7.7 (H) <5.7 % of total Hgb Final  ?  Comment:  ?  For someone without known diabetes, a hemoglobin A1c ?value of 6.5% or greater indicates that they may have  ?diabetes and this should be confirmed with a follow-up  ?test. ?. ?For someone with known diabetes, a value <7% indicates  ?that their diabetes is well controlled and a value  ?greater than or equal to 7% indicates suboptimal  ?control. A1c targets should be individualized based on  ?duration of diabetes, age, comorbid conditions, and  ?other considerations. ?. ?Currently, no consensus exists regarding use of ?hemoglobin A1c for diagnosis of diabetes for children. ?. ?  ?   ?  ?  Passed - eGFR in normal range and within 360 days  ?  GFR, Est African American  ?Date Value Ref Range Status  ?06/14/2020 82 > OR = 60 mL/min/1.38m2 Final  ? ?GFR, Est Non African American  ?Date Value Ref Range Status  ?06/14/2020 70 >  OR = 60 mL/min/1.75m2 Final  ? ?GFR, Estimated  ?Date Value Ref Range Status  ?10/06/2021 >60 >60 mL/min Final  ?  Comment:  ?  (NOTE) ?Calculated using the CKD-EPI Creatinine Equation (2021) ?  ? ?eGFR  ?Date Value Ref Range Status  ?08/01/2021 72 > OR = 60 mL/min/1.24m2 Final  ?  Comment:  ?  The eGFR is based on the CKD-EPI 2021 equation. To calculate  ?the new eGFR from a previous Creatinine or Cystatin C ?result, go to https://www.kidney.org/professionals/ ?kdoqi/gfr%5Fcalculator ?  ?   ?  ?  Passed - B12 Level in normal range and within 720 days  ?  Vitamin B-12  ?Date Value Ref Range Status  ?06/06/2021 564 180 - 914 pg/mL Final  ?  Comment:  ?  (NOTE) ?This assay is not validated for testing neonatal or ?myeloproliferative syndrome specimens for Vitamin B12 levels. ?Performed at [facility], [place] [address], [place], [place] ?[address] ?  ?   ?  ?  Passed - Valid encounter within last 6 months  ?  Recent Outpatient Visits   ?      ? 3 months ago Nonspecific chest pain  ? [facility] [place], Coralie Keens, NP  ?  3 months ago Chest pain, unspecified type  ? [facility] [place], [place] W, NP  ? 5 months ago Controlled type 2 diabetes mellitus with complication, without long-term current use of insulin ([place])  ? [facility] [place], [place] W, NP  ? 6 months ago Chronic right shoulder pain  ? [name] [place], [place] W, NP  ? 7 months ago Aortic atherosclerosis [facility])  ? [facility] [place], [place] W, NP  ?  ?  ? ?  ?  ?  Passed - CBC within normal limits and completed in the last 12 months  ?  WBC  ?Date Value Ref Range Status  ?10/06/2021 5.3 4.0 - 10.5 K/uL Final  ? ?RBC  ?Date Value Ref Range Status  ?10/06/2021 3.53 (L) 3.87 - 5.11 MIL/uL Final  ? ?Hemoglobin  ?Date Value Ref Range Status  ?10/06/2021 11.4 (L) 12.0 - 15.0 g/dL Final  ? ?HGB  ?Date Value Ref Range Status  ?09/01/2014 12.7 12.0 - 16.0 g/dL Final  ? ?HCT  ?Date Value Ref  Range Status  ?10/06/2021 33.5 (L) 36.0 - 46.0 % Final  ?09/01/2014 37.9 35.0 - 47.0 % Final  ? ?MCHC  ?Date Value Ref Range Status  ?10/06/2021 34.0 30.0 - 36.0 g/dL Final  ? ?MCH  ?Date Value Ref Range Status  ?10/06/2021 32.3 26.0 - 34.0 pg Final  ? ?MCV  ?Date Value Ref Range Status  ?10/06/2021 94.9 80.0 - 100.0 fL Final  ?09/01/2014 92 80 - 100 fL Final  ? ?No results found for: PLTCOUNTKUC, LABPLAT, [place] ?RDW  ?Date Value Ref Range Status  ?10/06/2021 12.6 11.5 - 15.5 % Final  ?09/01/2014 13.9 11.5 - 14.5 % Final  ? ?  ?  ?  ? ?

## 2022-01-12 NOTE — Telephone Encounter (Signed)
Trazodone- Rx 12/10/21 #90-too soon ?Requested Prescriptions  ?Pending Prescriptions Disp Refills  ?? cyclobenzaprine (FLEXERIL) 5 MG tablet [Pharmacy Med Name: CYCLOBENZAPRINE HCL 5 MG TAB] 30 tablet 2  ?  Sig: TAKE 1 TABLET BY MOUTH 3 TIMES DAILY AS NEEDED FOR MUSCLE SPASMS  ?  ? Not Delegated - Analgesics:  Muscle Relaxants Failed - 01/11/2022 12:46 PM  ?  ?  Failed - This refill cannot be delegated  ?  ?  Passed - Valid encounter within last 6 months  ?  Recent Outpatient Visits   ?      ? 3 months ago Nonspecific chest pain  ? [facility] [place], [place] W, NP  ? 3 months ago Chest pain, unspecified type  ? [facility] [place], [place] W, NP  ? 5 months ago Controlled type 2 diabetes mellitus with complication, without long-term current use of insulin ([place])  ? [facility] [place], [place] W, NP  ? 6 months ago Chronic right shoulder pain  ? [facility] [place], [place] W, NP  ? 7 months ago Aortic atherosclerosis [name])  ? [facility] [place], Coralie Keens, NP  ?  ?  ? ?  ?  ?  ?? buPROPion (WELLBUTRIN XL) 150 MG 24 hr tablet [Pharmacy Med Name: BUPROPION HCL ER (XL) 150 MG TAB] 90 tablet 0  ?  Sig: TAKE 1 TABLET BY MOUTH ONCE DAILY  ?  ? Psychiatry: Antidepressants - bupropion Passed - 01/11/2022 12:46 PM  ?  ?  Passed - Cr in normal range and within 360 days  ?  Creat  ?Date Value Ref Range Status  ?08/01/2021 0.85 0.60 - 1.00 mg/dL Final  ? ?Creatinine, Ser  ?Date Value Ref Range Status  ?10/06/2021 0.82 0.44 - 1.00 mg/dL Final  ? ?Creatinine, Urine  ?Date Value Ref Range Status  ?05/31/2021 89 20 - 275 mg/dL Final  ?   ?  ?  Passed - AST in normal range and within 360 days  ?  AST  ?Date Value Ref Range Status  ?08/01/2021 14 10 - 35 U/L Final  ? ?SGOT(AST)  ?Date Value Ref Range Status  ?09/01/2014 20 15 - 37 Unit/L Final  ?   ?  ?  Passed - ALT in normal range and within 360 days  ?  ALT  ?Date Value Ref Range Status  ?08/01/2021 17 6 - 29 U/L  Final  ? ?SGPT (ALT)  ?Date Value Ref Range Status  ?09/01/2014 24 U/L Final  ?  Comment:  ?  14-63 ?NOTE: New Reference Range ?04/07/14 ?  ?   ?  ?  Passed - Completed PHQ-2 or PHQ-9 in the last 360 days  ?  ?  Passed - Last BP in normal range  ?  BP Readings from Last 1 Encounters:  ?01/04/22 117/76  ?   ?  ?  Passed - Valid encounter within last 6 months  ?  Recent Outpatient Visits   ?      ? 3 months ago Nonspecific chest pain  ? [facility] [place], [place], NP  ? 3 months ago Chest pain, unspecified type  ? [name] [place], [place] W, NP  ? 5 months ago Controlled type 2 diabetes mellitus with complication, without long-term current use of insulin ([place])  ? [facility] [place], [place] W, NP  ? 6 months ago Chronic right shoulder pain  ? [facility] [place], [place] W, NP  ? 7 months ago Aortic atherosclerosis [facility])  ? [facility] [place], [place], NP  ?  ?  ? ?  ?  ?  ?? atorvastatin (LIPITOR) 40 MG tablet [Pharmacy Med Name: ATORVASTATIN CALCIUM 40 MG TAB] 90 tablet 0  ?  Sig: TAKE 1 TABLET BY MOUTH BEDTIME  ?  ? Cardiovascular:  Antilipid - Statins Failed - 01/11/2022 12:46 PM  ?  ?  Failed - Lipid Panel in normal range within the last 12 months  ?  Cholesterol, Total  ?Date Value Ref Range Status  ?06/07/2015 177 100 - 199 mg/dL Final  ? ?Cholesterol  ?Date Value Ref Range Status  ?08/01/2021 135 <200 mg/dL Final  ?09/02/2014 141 0 - 200 mg/dL Final  ? ?Ldl Cholesterol, Calc  ?Date Value Ref Range Status  ?09/02/2014 73 0 - 100 mg/dL Final  ? ?LDL Cholesterol (Calc)  ?Date Value Ref Range Status  ?08/01/2021 53 mg/dL (calc) Final  ?  Comment:  ?  Reference range: <100 ?[name] ?Desirable range <100 mg/dL for primary prevention;   ?<70 mg/dL for patients with CHD or diabetic patients  ?with > or = 2 CHD risk factors. ?. ?LDL-C is now calculated using the Martin-Hopkins  ?calculation, which is a validated novel method providing  ?better  accuracy than the Friedewald equation in the  ?estimation of LDL-C.  ?Cresenciano Genre et al. Annamaria Helling. 9678;938(10): 2061-2068  ?(http://education.[employer].com/faq/FAQ164) ?  ? ?HDL Cholesterol  ?Date Value Ref Range Status  ?09/02/2014 37 (L) 40 - 60 mg/dL Final  ? ?HDL  ?Date Value Ref Range Status  ?08/01/2021 65 > OR = 50 mg/dL Final  ?06/07/2015 56 >39 mg/dL Final  ?  Comment:  ?  According to ATP-III Guidelines, HDL-C >59 mg/dL is considered a ?negative risk factor for CHD. ?  ? ?Triglycerides  ?Date Value Ref Range Status  ?08/01/2021 89 <150 mg/dL Final  ?09/02/2014 155 0 - 200 mg/dL Final  ? ?  ?  ?  Passed - Patient is not pregnant  ?  ?  Passed - Valid encounter within last 12 months  ?  Recent Outpatient Visits   ?      ? 3 months ago Nonspecific chest pain  ? [facility] [place], [place] W, NP  ? 3 months ago Chest pain, unspecified type  ? [facility] [place], [place] W, NP  ? 5 months ago Controlled type 2 diabetes mellitus with complication, without long-term current use of insulin ([place])  ? [facility] [place], [place] W, NP  ? 6 months ago Chronic right shoulder pain  ? [facility] [place], [place] W, NP  ? 7 months ago Aortic atherosclerosis [facility])  ? [facility] [place], Coralie Keens, NP  ?  ?  ? ?  ?  ?  ?Refused Prescriptions Disp Refills  ?? traZODone (DESYREL) 50 MG tablet [Pharmacy Med Name: TRAZODONE HCL 50 MG TAB] 90 tablet 0  ?  Sig: TAKE 1/2 TO 1 TABLET BY MOUTH AT [place]  ?  ? Psychiatry: Antidepressants - Serotonin Modulator Passed - 01/11/2022 12:46 PM  ?  ?  Passed - Completed PHQ-2 or PHQ-9 in the last 360 days  ?  ?  Passed - Valid encounter within last 6 months  ?  Recent Outpatient Visits   ?      ? 3 months ago Nonspecific chest pain  ?  [facility] [place], [place], NP  ? 3 months ago Chest pain, unspecified type  ? [facility] [place], [place] W, NP  ? 5 months ago Controlled  type 2 diabetes mellitus with complication, without long-term current use of insulin ([place])  ? [facility] [place], [place] W, NP  ? 6 months ago Chronic right shoulder pain  ? [facility] [place], [place] W, NP  ? 7 months ago Aortic atherosclerosis [facility])  ? [facility] [place], Coralie Keens, NP  ?  ?  ? ?  ?  ?  ? ?

## 2022-01-12 NOTE — Telephone Encounter (Signed)
Requested medication (s) are due for refill today - unsure ? ?Requested medication (s) are on the active medication list -yes ? ?Future visit scheduled -yes ? ?Last refill: 07/18/21 #30 2RF ? ?Notes to clinic: Request RF: non delegated Rx ? ?Requested Prescriptions  ?Pending Prescriptions Disp Refills  ? cyclobenzaprine (FLEXERIL) 5 MG tablet [Pharmacy Med Name: CYCLOBENZAPRINE HCL 5 MG TAB] 30 tablet 2  ?  Sig: TAKE 1 TABLET BY MOUTH 3 TIMES DAILY AS NEEDED FOR MUSCLE SPASMS  ?  ? Not Delegated - Analgesics:  Muscle Relaxants Failed - 01/11/2022 12:46 PM  ?  ?  Failed - This refill cannot be delegated  ?  ?  Passed - Valid encounter within last 6 months  ?  Recent Outpatient Visits   ? ?      ? 3 months ago Nonspecific chest pain  ? [facility] [place], [place], NP  ? 3 months ago Chest pain, unspecified type  ? [facility] [place], [place] W, NP  ? 5 months ago Controlled type 2 diabetes mellitus with complication, without long-term current use of insulin ([place])  ? [facility] [place], [place] W, NP  ? 6 months ago Chronic right shoulder pain  ? [facility] [place], [place] W, NP  ? 7 months ago Aortic atherosclerosis [facility])  ? [facility] [place], Coralie Keens, NP  ? ?  ?  ? ? ?  ?  ?  ?Signed Prescriptions Disp Refills  ? buPROPion (WELLBUTRIN XL) 150 MG 24 hr tablet 90 tablet 0  ?  Sig: TAKE 1 TABLET BY MOUTH ONCE DAILY  ?  ? Psychiatry: Antidepressants - bupropion Passed - 01/11/2022 12:46 PM  ?  ?  Passed - Cr in normal range and within 360 days  ?  Creat  ?Date Value Ref Range Status  ?08/01/2021 0.85 0.60 - 1.00 mg/dL Final  ? ?Creatinine, Ser  ?Date Value Ref Range Status  ?10/06/2021 0.82 0.44 - 1.00 mg/dL Final  ? ?Creatinine, Urine  ?Date Value Ref Range Status  ?05/31/2021 89 20 - 275 mg/dL Final  ?  ?  ?  ?  Passed - AST in normal range and within 360 days  ?  AST  ?Date Value Ref Range Status  ?08/01/2021 14 10 - 35 U/L Final  ? ?SGOT(AST)   ?Date Value Ref Range Status  ?09/01/2014 20 15 - 37 Unit/L Final  ?  ?  ?  ?  Passed - ALT in normal range and within 360 days  ?  ALT  ?Date Value Ref Range Status  ?08/01/2021 17 6 - 29 U/L Final  ? ?SGPT (ALT)  ?Date Value Ref Range Status  ?09/01/2014 24 U/L Final  ?  Comment:  ?  14-63 ?NOTE: New Reference Range ?04/07/14 ?  ?  ?  ?  ?  Passed - Completed PHQ-2 or PHQ-9 in the last 360 days  ?  ?  Passed - Last BP in normal range  ?  BP Readings from Last 1 Encounters:  ?01/04/22 117/76  ?  ?  ?  ?  Passed - Valid encounter within last 6 months  ?  Recent Outpatient Visits   ? ?      ? 3 months ago Nonspecific chest pain  ? [facility] [place], [place] W, NP  ? 3 months ago Chest pain, unspecified type  ? [facility] [place], Coralie Keens, NP  ?  5 months ago Controlled type 2 diabetes mellitus with complication, without long-term current use of insulin ([place])  ? [facility] [place], [place] W, NP  ? 6 months ago Chronic right shoulder pain  ? [facility] [place], [place], NP  ? 7 months ago Aortic atherosclerosis [facility])  ? [facility] [place], [place] W, NP  ? ?  ?  ? ? ?  ?  ?  ? atorvastatin (LIPITOR) 40 MG tablet 90 tablet 0  ?  Sig: TAKE 1 TABLET BY MOUTH BEDTIME  ?  ? Cardiovascular:  Antilipid - Statins Failed - 01/11/2022 12:46 PM  ?  ?  Failed - Lipid Panel in normal range within the last 12 months  ?  Cholesterol, Total  ?Date Value Ref Range Status  ?06/07/2015 177 100 - 199 mg/dL Final  ? ?Cholesterol  ?Date Value Ref Range Status  ?08/01/2021 135 <200 mg/dL Final  ?09/02/2014 141 0 - 200 mg/dL Final  ? ?Ldl Cholesterol, Calc  ?Date Value Ref Range Status  ?09/02/2014 73 0 - 100 mg/dL Final  ? ?LDL Cholesterol (Calc)  ?Date Value Ref Range Status  ?08/01/2021 53 mg/dL (calc) Final  ?  Comment:  ?  Reference range: <100 ?[name] ?Desirable range <100 mg/dL for primary prevention;   ?<70 mg/dL for patients with CHD or diabetic patients  ?with >  or = 2 CHD risk factors. ?. ?LDL-C is now calculated using the Martin-Hopkins  ?calculation, which is a validated novel method providing  ?better accuracy than the Friedewald equation in the  ?estimation of LDL-C.  ?Cresenciano Genre et al. Annamaria Helling. 8756;433(29): 2061-2068  ?(http://education.[facility].com/faq/FAQ164) ?  ? ?HDL Cholesterol  ?Date Value Ref Range Status  ?09/02/2014 37 (L) 40 - 60 mg/dL Final  ? ?HDL  ?Date Value Ref Range Status  ?08/01/2021 65 > OR = 50 mg/dL Final  ?06/07/2015 56 >39 mg/dL Final  ?  Comment:  ?  According to ATP-III Guidelines, HDL-C >59 mg/dL is considered a ?negative risk factor for CHD. ?  ? ?Triglycerides  ?Date Value Ref Range Status  ?08/01/2021 89 <150 mg/dL Final  ?09/02/2014 155 0 - 200 mg/dL Final  ? ?  ?  ?  Passed - Patient is not pregnant  ?  ?  Passed - Valid encounter within last 12 months  ?  Recent Outpatient Visits   ? ?      ? 3 months ago Nonspecific chest pain  ? [facility] [place], [place], NP  ? 3 months ago Chest pain, unspecified type  ? [facility] [place], [place] W, NP  ? 5 months ago Controlled type 2 diabetes mellitus with complication, without long-term current use of insulin ([place])  ? [facility] [place], [place] W, NP  ? 6 months ago Chronic right shoulder pain  ? [facility] [place], [place] W, NP  ? 7 months ago Aortic [facility])  ? [facility] [place], Coralie Keens, NP  ? ?  ?  ? ? ?  ?  ?  ?Refused Prescriptions Disp Refills  ? traZODone (DESYREL) 50 MG tablet [Pharmacy Med Name: TRAZODONE HCL 50 MG TAB] 90 tablet 0  ?  Sig: TAKE 1/2 TO 1 TABLET BY MOUTH AT [place]  ?  ? Psychiatry: Antidepressants - Serotonin Modulator Passed - 01/11/2022 12:46 PM  ?  ?  Passed - Completed PHQ-2 or PHQ-9 in the last 360 days  ?  ?  Passed - Valid encounter within last 6 months  ?  Recent Outpatient Visits   ? ?      ? 3 months ago Nonspecific chest pain  ? [facility] [place], [place], NP  ? 3 months ago Chest pain, unspecified type  ? [facility] [place], [place], NP  ? 5 months ago Controlled type 2 diabetes mellitus with complication, without long-term current use of insulin ([place])  ? [facility] [place], [place] W, NP  ? 6 months ago Chronic right shoulder pain  ? [facility] [place], [place], NP  ? 7 months ago Aortic [facility])  ? [name] [place], Coralie Keens, NP  ? ?  ?  ? ? ?  ?  ?  ? ? ? ?Requested Prescriptions  ?Pending Prescriptions Disp Refills  ? cyclobenzaprine (FLEXERIL) 5 MG tablet [Pharmacy Med Name: CYCLOBENZAPRINE HCL 5 MG TAB] 30 tablet 2  ?  Sig: TAKE 1 TABLET BY MOUTH 3 TIMES DAILY AS NEEDED FOR MUSCLE SPASMS  ?  ? Not Delegated - Analgesics:  Muscle Relaxants Failed - 01/11/2022 12:46 PM  ?  ?  Failed - This refill cannot be delegated  ?  ?  Passed - Valid encounter within last 6 months  ?  Recent Outpatient Visits   ? ?      ? 3 months ago Nonspecific chest pain  ? [facility] [place], [place], NP  ? 3 months ago Chest pain, unspecified type  ? [facility] [place], [place] W, NP  ? 5 months ago Controlled type 2 diabetes mellitus with complication, without long-term current use of insulin ([place])  ? [facility] [place], [place] W, NP  ? 6 months ago Chronic right shoulder pain  ? [facility] [place], [place] W, NP  ? 7 months ago A[facility])  ? [facility] [place], Coralie Keens, NP  ? ?  ?  ? ? ?  ?  ?  ?Signed Prescriptions Disp Refills  ? buPROPion (WELLBUTRIN XL) 150 MG 24 hr tablet 90 tablet 0  ?  Sig: TAKE 1 TABLET BY MOUTH ONCE DAILY  ?  ? Psychiatry: Antidepressants - bupropion Passed - 01/11/2022 12:46 PM  ?  ?  Passed - Cr in normal range and within 360 days  ?  Creat  ?Date Value Ref Range Status  ?08/01/2021 0.85 0.60 - 1.00 mg/dL Final  ? ?Creatinine, Ser  ?Date Value Ref Range Status   ?10/06/2021 0.82 0.44 - 1.00 mg/dL Final  ? ?Creatinine, Urine  ?Date Value Ref Range Status  ?05/31/2021 89 20 - 275 mg/dL Final  ?  ?  ?  ?  Passed - AST in normal range and within 360 days  ?  AST  ?Date Val

## 2022-01-13 ENCOUNTER — Ambulatory Visit: Payer: 59 | Admitting: Pharmacist

## 2022-01-13 DIAGNOSIS — E118 Type 2 diabetes mellitus with unspecified complications: Secondary | ICD-10-CM

## 2022-01-13 NOTE — Patient Instructions (Signed)
Visit Information ? ?Thank you for taking time to visit with me today. Please don't hesitate to contact me if I can be of assistance to you before our next scheduled telephone appointment. ? ?Following are the goals we discussed today:  ? Goals Addressed   ? ?  ?  ?  ?  ? This Visit's Progress  ?  Pharmacy - Patient Goals     ?  Please continue to keep log of home blood sugar results and have for [place] to review during our calls ? ?Our goal bad cholesterol, or LDL, is less than 70 . This is why it is important to continue taking your atorvastatin. ? ?I look forward to talking to you during our next telephone appointment. Please call if you need something sooner! ? ?Wallace Cullens, PharmD, BCACP ?Clinical Pharmacist ?[facility] ?[place] ?[phone] ? ?  ? ?  ? ? ? ?Our next appointment is by telephone on 5/31 at 9:15 am ? ?Please call the care guide team at [phone] if you need to cancel or reschedule your appointment.  ? ? ?The patient verbalized understanding of instructions, educational materials, and care plan provided today and declined offer to receive copy of patient instructions, educational materials, and care plan.  ? ?

## 2022-01-13 NOTE — Chronic Care Management (AMB) (Signed)
? ?Chronic Care Management ?CCM Pharmacy Note ? ?01/13/2022 ?Name:  Kendra [name] MRN:  [id number] DOB:  June 22, 1946 ? ? ?Subjective: ?Kendra [name] is an 76 y.o. year old female who is a primary patient of [name], NP.  The CCM team was consulted for assistance with disease management and care coordination needs.   ? ?Engaged with patient by telephone for follow up visit for pharmacy case management and/or care coordination services.  ? ?Objective: ? ?Medications Reviewed Today   ? ? Reviewed by Wilson Singer, CMA (Certified Medical Assistant) on 01/04/22 at 31  Med List Status: <None>  ? ?Medication Order Taking? Sig Documenting Provider Last Dose Status Informant  ?ACCU-CHEK GUIDE test strip [id number] Yes USE AS DIRECTED. UP TO 4 TIMES DAILY ONCE DAILY [name], Coralie Keens, NP Taking Active   ?Accu-Chek Softclix Lancets lancets [id number] Yes USE AS DIRECTED. UP TO 4 TIMES A DAY [name], Coralie Keens, NP Taking Active   ?Acetaminophen (TYLENOL 8 HOUR ARTHRITIS PAIN PO) [id number] Yes Take 2 capsules by mouth in the morning, at noon, in the evening, and at bedtime. [provider] Taking Active Multiple Informants  ?albuterol (VENTOLIN HFA) 108 (90 Base) MCG/ACT inhaler [id number] Yes INHALE 1-2 PUFFS BY MOUTH EVERY 6 HOURS AS NEEDED WHEEZING/ SHORTNESS OF BREATH [name], NP Taking Active   ?aspirin EC 81 MG tablet [id number] Yes Take 1 tablet (81 mg total) by mouth daily. Swallow whole. [name], NP Taking Active   ?atorvastatin (LIPITOR) 40 MG tablet [id number] Yes TAKE 1 TABLET BY MOUTH BEDTIME [name], NP Taking Active   ?blood glucose meter kit and supplies KIT [id number] Yes Dispense based on patient and insurance preference. Use up to four times daily as directed. [name], NP Taking Active   ?Blood Glucose Monitoring Suppl [facility] VERIO) w/Device Drucie Opitz [id number] Yes USE AS DIRECTED Malfi, [name], FNP Taking Active Multiple Informants  ?buPROPion (WELLBUTRIN XL) 150  MG 24 hr tablet [id number] Yes TAKE 1 TABLET BY MOUTH ONCE DAILY [name], NP Taking Active   ?clopidogrel (PLAVIX) 75 MG tablet [id number] Yes TAKE 1 TABLET BY MOUTH ONCE DAILY [name], Coralie Keens, NP Taking Active   ?cyclobenzaprine (FLEXERIL) 5 MG tablet [id number] No Take 1 tablet (5 mg total) by mouth 3 (three) times daily as needed for muscle spasms.  ?Patient not taking: Reported on 01/04/2022  ? [name], NP Not Taking Active   ?docusate sodium (COLACE) 100 MG capsule [id number] Yes Take 1 capsule (100 mg total) by mouth 2 (two) times daily as needed for mild constipation. [name], NP Taking Active   ?Lancet Devices (ONE TOUCH DELICA LANCING DEV) MISC [id number] Yes 1 Device by Does not apply route 2 (two) times daily. [name], FNP Taking Active Multiple Informants  ?metFORMIN (GLUCOPHAGE) 500 MG tablet [id number] Yes TAKE 1 TABLET BY MOUTH TWICE DAILY WITH A MEAL [name], Coralie Keens, NP Taking Active   ?traZODone (DESYREL) 50 MG tablet [id number] Yes Take 0.5-1 tablets (25-50 mg total) by mouth at bedtime as needed for sleep. [name], NP Taking Active   ?TRELEGY ELLIPTA 100-62.5-25 MCG/ACT AEPB [id number] Yes USE ONE INHALATION INTO THE LUNGS ONCE A DAY RINSE MOUTH AFTER EACH USE [name], NP Taking Active   ? ?  ?  ? ?  ? ? ?Pertinent Labs:  ?Lab Results  ?Component Value Date  ? HGBA1C 7.7 (H) 08/01/2021  ? ?Lab Results  ?Component  Value Date  ? CHOL 135 08/01/2021  ? HDL 65 08/01/2021  ? [place] 53 08/01/2021  ? TRIG 89 08/01/2021  ? CHOLHDL 2.1 08/01/2021  ? ?Lab Results  ?Component Value Date  ? CREATININE 0.82 10/06/2021  ? BUN 22 10/06/2021  ? NA 136 10/06/2021  ? K 3.8 10/06/2021  ? CL 101 10/06/2021  ? CO2 25 10/06/2021  ? ?BP Readings from Last 3 Encounters:  ?01/04/22 117/76  ?10/06/21 121/73  ?08/07/21 115/84  ? ?Pulse Readings from Last 3 Encounters:  ?01/04/22 97  ?10/06/21 85  ?08/07/21 94  ? ? ? ?SDOH:  (Social Determinants of Health) assessments and interventions  performed:  ? ? ?CCM Care Plan ? ?Review of patient past medical history, allergies, medications, health status, including review of consultants reports, laboratory and other test data, was performed as part of comprehensive evaluation and provision of chronic care management services.  ? ?Care Plan : PharmD - Medication Mgmt  ?Updates made by Rennis Petty, RPH-CPP since 01/13/2022 12:00 AM  ?  ? ?Problem: Disease Progression   ?  ? ?Long-Range Goal: Disease Progression Prevented or Minimized   ?Start Date: 11/01/2020  ?Expected End Date: 01/30/2021  ?Recent Progress: On track  ?Priority: High  ?Note:   ?Current Barriers:  ?Financial Barriers ?Limited social support ? ?Pharmacist Clinical Goal(s):  ?Over the next 90 days, patient will maintain adherence to monitoring guidelines and medication adherence to achieve therapeutic efficacy through collaboration with PharmD and provider.  ? ?Interventions: ?1:1 collaboration with Webb Silversmith, NP regarding development and update of comprehensive plan of care as evidenced by provider attestation and co-signature ?Inter-disciplinary care team collaboration (see longitudinal plan of care) ?Perform chart review ?Telephone conversation with patient is in and out - patient continuing to have difficulty with her phone, but reports planning to obtain new phone ? ?Coordination of Care: ?Note when last spoke with patient on 3/31, she had planned to call office to schedule appointment with PCP for evaluation of pain in hip and thumb. Today patient reports was unable to schedule appointment/has missed recent appointments due to transportation issues since switched her insurance again, first to [place], then back to [employer] (states latest coverage with [employer] active starting 01/16/2022) ?Today encourage patient to call regarding missed appointments for Carotid scan as ordered by [facility], with [facility] and to  schedule appointment with PCP. Numbers provided as needed ?Reports will call to reschedule, as well as call [place] on 5/1 to arrange transportation for appointments ? ?Medication Management/adherence: ?Reports continues to take medication from pill pack as packaged by [name]. Reports latest pack to be delivered today. ?Have counseled patient on importance of using maintenance inhaler (Trelegy) daily and rescue inhaler (albuterol) as needed as directed and to rinse out mouth after each use of Trelegy ? ?T2DM: ?Current treatment: metformin 500 mg twice daily ?Reports has not checked home blood sugar recently as unable to use new Accu-chek Guide Me meter.  ?Verbally walk patient through how to use this new meter ?Advise patient if has further questions or difficulty with meter in the future could bring device up to her local pharmacy or to call CCM Pharmacist ?Denies symptoms of hypoglycemia ?Encourage patient to have regular well-balanced meals throughout the day, limiting carbohydrate portion sizes ?Reports tried to order more Glucerna through over the counter benefit, but supplier was out of Glucerna. Advise patient might consider trying Ensure Diabetes Care or Boost Glucose Control if  either of these lower sugar options are available ?  ? ?Patient Goals/Self-Care Activities ?Over the next 90 days, patient will:  ?- take medications as prescribed ? Using pill packaging from [facility] ?- check glucose, document, and provide at future appointments ?- check blood pressure, document, and provide at future appointments ? ?Follow Up Plan: The care management team will reach out to the patient again over the next 30 days.   ? ?  ?  ? ?Wallace Cullens, PharmD, BCACP, CPP ?Clinical Pharmacist ?[facility] ?[place] ?[phone] ? ? ? ? ? ?

## 2022-01-15 DIAGNOSIS — E1159 Type 2 diabetes mellitus with other circulatory complications: Secondary | ICD-10-CM

## 2022-01-15 DIAGNOSIS — F3341 Major depressive disorder, recurrent, in partial remission: Secondary | ICD-10-CM

## 2022-01-15 DIAGNOSIS — E1169 Type 2 diabetes mellitus with other specified complication: Secondary | ICD-10-CM | POA: Diagnosis not present

## 2022-01-15 DIAGNOSIS — M159 Polyosteoarthritis, unspecified: Secondary | ICD-10-CM | POA: Diagnosis not present

## 2022-01-15 DIAGNOSIS — I152 Hypertension secondary to endocrine disorders: Secondary | ICD-10-CM

## 2022-01-15 DIAGNOSIS — J410 Simple chronic bronchitis: Secondary | ICD-10-CM

## 2022-01-15 DIAGNOSIS — E785 Hyperlipidemia, unspecified: Secondary | ICD-10-CM

## 2022-01-15 DIAGNOSIS — E118 Type 2 diabetes mellitus with unspecified complications: Secondary | ICD-10-CM | POA: Diagnosis not present

## 2022-01-17 ENCOUNTER — Encounter: Payer: Self-pay | Admitting: Oncology

## 2022-01-20 ENCOUNTER — Encounter: Payer: Self-pay | Admitting: Oncology

## 2022-01-24 ENCOUNTER — Inpatient Hospital Stay: Payer: 59 | Attending: Oncology

## 2022-01-24 ENCOUNTER — Inpatient Hospital Stay: Payer: 59

## 2022-01-24 DIAGNOSIS — E538 Deficiency of other specified B group vitamins: Secondary | ICD-10-CM | POA: Diagnosis present

## 2022-01-24 DIAGNOSIS — R803 Bence Jones proteinuria: Secondary | ICD-10-CM | POA: Insufficient documentation

## 2022-01-24 DIAGNOSIS — Z87891 Personal history of nicotine dependence: Secondary | ICD-10-CM | POA: Diagnosis not present

## 2022-01-24 LAB — CBC WITH DIFFERENTIAL/PLATELET
Abs Immature Granulocytes: 0.04 10*3/uL (ref 0.00–0.07)
Basophils Absolute: 0 10*3/uL (ref 0.0–0.1)
Basophils Relative: 0 %
Eosinophils Absolute: 0.3 10*3/uL (ref 0.0–0.5)
Eosinophils Relative: 4 %
HCT: 36.4 % (ref 36.0–46.0)
Hemoglobin: 11.9 g/dL — ABNORMAL LOW (ref 12.0–15.0)
Immature Granulocytes: 1 %
Lymphocytes Relative: 23 %
Lymphs Abs: 1.7 10*3/uL (ref 0.7–4.0)
MCH: 31.5 pg (ref 26.0–34.0)
MCHC: 32.7 g/dL (ref 30.0–36.0)
MCV: 96.3 fL (ref 80.0–100.0)
Monocytes Absolute: 0.4 10*3/uL (ref 0.1–1.0)
Monocytes Relative: 5 %
Neutro Abs: 5.1 10*3/uL (ref 1.7–7.7)
Neutrophils Relative %: 67 %
Platelets: 252 10*3/uL (ref 150–400)
RBC: 3.78 MIL/uL — ABNORMAL LOW (ref 3.87–5.11)
RDW: 13.2 % (ref 11.5–15.5)
WBC: 7.7 10*3/uL (ref 4.0–10.5)
nRBC: 0 % (ref 0.0–0.2)

## 2022-01-24 LAB — COMPREHENSIVE METABOLIC PANEL
ALT: 27 U/L (ref 0–44)
AST: 30 U/L (ref 15–41)
Albumin: 4.6 g/dL (ref 3.5–5.0)
Alkaline Phosphatase: 62 U/L (ref 38–126)
Anion gap: 8 (ref 5–15)
BUN: 23 mg/dL (ref 8–23)
CO2: 27 mmol/L (ref 22–32)
Calcium: 9.7 mg/dL (ref 8.9–10.3)
Chloride: 102 mmol/L (ref 98–111)
Creatinine, Ser: 0.83 mg/dL (ref 0.44–1.00)
GFR, Estimated: >60 mL/min (ref >60)
Glucose, Bld: 129 mg/dL — ABNORMAL HIGH (ref 70–99)
Potassium: 3.6 mmol/L (ref 3.5–5.1)
Sodium: 137 mmol/L (ref 135–145)
Total Bilirubin: 0.4 mg/dL (ref 0.3–1.2)
Total Protein: 7.8 g/dL (ref 6.5–8.1)

## 2022-01-24 LAB — VITAMIN B12: Vitamin B-12: 378 pg/mL (ref 180–914)

## 2022-01-25 ENCOUNTER — Ambulatory Visit: Payer: Medicaid Other | Admitting: Internal Medicine

## 2022-01-25 LAB — KAPPA/LAMBDA LIGHT CHAINS
Kappa free light chain: 55.1 mg/L — ABNORMAL HIGH (ref 3.3–19.4)
Kappa, lambda light chain ratio: 5.35 — ABNORMAL HIGH (ref 0.26–1.65)
Lambda free light chains: 10.3 mg/L (ref 5.7–26.3)

## 2022-01-27 LAB — MULTIPLE MYELOMA PANEL, SERUM
Albumin SerPl Elph-Mcnc: 4.4 g/dL (ref 2.9–4.4)
Albumin/Glob SerPl: 1.9 — ABNORMAL HIGH (ref 0.7–1.7)
Alpha 1: 0.2 g/dL (ref 0.0–0.4)
Alpha2 Glob SerPl Elph-Mcnc: 0.7 g/dL (ref 0.4–1.0)
B-Globulin SerPl Elph-Mcnc: 0.9 g/dL (ref 0.7–1.3)
Gamma Glob SerPl Elph-Mcnc: 0.6 g/dL (ref 0.4–1.8)
Globulin, Total: 2.4 g/dL (ref 2.2–3.9)
IgA: 177 mg/dL (ref 64–422)
IgG (Immunoglobin G), Serum: 671 mg/dL (ref 586–1602)
IgM (Immunoglobulin M), Srm: 96 mg/dL (ref 26–217)
Total Protein ELP: 6.8 g/dL (ref 6.0–8.5)

## 2022-01-30 ENCOUNTER — Ambulatory Visit: Payer: Self-pay

## 2022-01-30 ENCOUNTER — Inpatient Hospital Stay: Payer: 59

## 2022-01-30 ENCOUNTER — Telehealth: Payer: Medicaid Other

## 2022-01-30 ENCOUNTER — Ambulatory Visit (INDEPENDENT_AMBULATORY_CARE_PROVIDER_SITE_OTHER): Payer: 59

## 2022-01-30 ENCOUNTER — Inpatient Hospital Stay: Payer: 59 | Admitting: Oncology

## 2022-01-30 DIAGNOSIS — E118 Type 2 diabetes mellitus with unspecified complications: Secondary | ICD-10-CM

## 2022-01-30 DIAGNOSIS — M159 Polyosteoarthritis, unspecified: Secondary | ICD-10-CM

## 2022-01-30 DIAGNOSIS — E1159 Type 2 diabetes mellitus with other circulatory complications: Secondary | ICD-10-CM

## 2022-01-30 DIAGNOSIS — F3341 Major depressive disorder, recurrent, in partial remission: Secondary | ICD-10-CM

## 2022-01-30 DIAGNOSIS — R319 Hematuria, unspecified: Secondary | ICD-10-CM

## 2022-01-30 DIAGNOSIS — Z8709 Personal history of other diseases of the respiratory system: Secondary | ICD-10-CM

## 2022-01-30 DIAGNOSIS — E1169 Type 2 diabetes mellitus with other specified complication: Secondary | ICD-10-CM

## 2022-01-30 NOTE — Telephone Encounter (Signed)
? ? ?  Chief Complaint: Blood in urine ?Symptoms: Has seen it twice since 01/23/22. No pain ?Frequency: 01/23/22 ?Pertinent Negatives: Patient denies any pain or other symptoms. ?Disposition: '[]'$ ED /'[]'$ Urgent Care (no appt availability in office) / '[x]'$ Appointment(In office/virtual)/ '[]'$  [place]/ '[]'$ Home Care/ '[]'$ Refused Recommended Disposition /'[]'$ [facility]/ '[]'$  Follow-up with PCP ?Additional Notes:  Office closed for lunch. Asking for assistance with transportation. ?Reason for Disposition ? Blood in urine  (Exception: could be normal menstrual bleeding) ? ?Answer Assessment - Initial Assessment Questions ?1. COLOR of URINE: "Describe the color of the urine."  (e.g., tea-colored, pink, red, blood clots, bloody) ?    Dark red ?2. ONSET: "When did the bleeding start?"  ?    May 8 ?3. EPISODES: "How many times has there been blood in the urine?" or "How many times today?" ?     X 1  ?4. PAIN with URINATION: "Is there any pain with passing your urine?" If Yes, ask: "How bad is the pain?"  (Scale 1-10; or mild, moderate, severe) ?   - MILD - complains slightly about urination hurting ?   - MODERATE - interferes with normal activities   ?   - SEVERE - excruciating, unwilling or unable to urinate because of the pain  ?    None ?5. FEVER: "Do you have a fever?" If Yes, ask: "What is your temperature, how was it measured, and when did it start?" ?    No ?6. ASSOCIATED SYMPTOMS: "Are you passing urine more frequently than usual?" ?    No ?7. OTHER SYMPTOMS: "Do you have any other symptoms?" (e.g., back/flank pain, abdominal pain, vomiting) ?    [place] 01/23/22 ?8. PREGNANCY: "Is there any chance you are pregnant?" "When was your last menstrual period?" ?    No ? ?Protocols used: Urine - Blood In-A-AH ? ?

## 2022-01-30 NOTE — Patient Instructions (Signed)
Visit Information ? ?Thank you for taking time to visit with me today. Please don't hesitate to contact me if I can be of assistance to you before our next scheduled telephone appointment. ? ?Following are the goals we discussed today:  ?COPD: (Status: Goal on Track (progressing): YES.) Long Term Goal  ?Reviewed medications with patient, including use of prescribed maintenance and rescue inhalers, and provided instruction on medication management and the importance of adherence. 01-30-2022: The patient is compliant with medications. Denies any medications needs today ?Provided patient with basic written and verbal COPD education on self care/management/and exacerbation prevention. 10-10-2021: The patient was in the ER for evaluation of chest pain on 10-06-2021. The patient states that they told her she had "bronchitis" but did not give her any medications. The patient states she has a runny nose and the drainage is clear. Denies fever or chills, education and appointment secured for the patient to talk to pcp on 10-11-2021 via phone at 4 pm. Review and confirmed with the patient. 11-14-2021: The patient states she is doing better and denies any issues related to COPD or changes in her breathing. She has stopped smoking and continues to refrain from smoking. Praised the patient for positive changes made for her health and well being. The patient says it is hard not to smoke but she is dedicated to refrain from smoking. 01-30-2022: The patient denies any exacerbations with her COPD. States her condition is stable. No new concerns with her breathing at this time. Knows to call for changes. ?Advised patient to track and manage COPD triggers. 11-14-2021: Review with the patient and education provider on factors that can trigger COPD exacerbation. The patient states the cold and raining weather causes issues and she knows to monitor closely for changes in her condition. 01-30-2022: The patient states her COPD is stable at this  time. Knows factors that trigger an exacerbation. ?Provided written and verbal instructions on pursed lip breathing and utilized returned demonstration as teach back. 01-30-2022: The patient works with the [facility] on a regular basis. Knows to call for changes or new needs.  ?Provided instruction about proper use of medications used for management of COPD including inhalers ?Advised patient to self assesses COPD action plan zone and make appointment with provider if in the yellow zone for 48 hours without improvement ?Advised patient to engage in light exercise as tolerated 3-5 days a week to aid in the the management of COPD ?Provided education about and advised patient to utilize infection prevention strategies to reduce risk of respiratory infection. 01-30-2022: The patient denies any issues with her COPD. Knows the factors that contribute to increase risk of exacerbation. Will continue to monitor for changes or new needs.  ?Discussed the importance of adequate rest and management of fatigue with COPD. 01-30-2022: Review and the patient is staying safe and denies any acute findings. Denies any issues with CP or respiratory conditions.  ?Screening for signs and symptoms of depression related to chronic disease state  ?Assessed social determinant of health barriers ?  ?Diabetes:  (Status: Goal on Track (progressing): YES.) Long Term Goal  ?  ?     ?Lab Results  ?Component Value Date  ?  HGBA1C 7.7 (H) 08/01/2021  ?Assessed patient's understanding of A1c goal: <7%. 08-08-2021: States her A1C was elevated this time. Review of steroid shots may have caused some of the reason for elevation. The patient states she did not have her paper with her and is having to write down her  numbers. Will mail the patient more sheets to record blood pressures and blood sugars. 01-30-2022: Denies any acute findings related to DM. Gave the RNCM several readings related to DM management. See updated list below.  ?Provided education to patient  about basic DM disease process. 01-30-2022: Review of DM and the patient states she feels fine related to her DM health and well being. ; ?Reviewed medications with patient and discussed importance of medication adherence. 08-08-2021: The patient has started back on Metformin 500 mg BID, endorses compliance. Denies any issues.  01-30-2022: The patient is compliant with medications, denies any acute findings. The patient works with the pharm D on a regular basis for effective management of medications for DM and other chronic conditions.     ?Reviewed prescribed diet with patient heart healthy/ADA- 01-30-2022: The patient is compliant with heart healthy/ADA diet ; ?Counseled on importance of regular laboratory monitoring as prescribed. 01-30-2022: The patient is compliant with getting regular lab work for evaluation;        ?Discussed plans with patient for ongoing care management follow up and provided patient with direct contact information for care management team.  ?Provided patient with written educational materials related to hypo and hyperglycemia and importance of correct treatment. 10-10-2021: Denies any lows but states she knows how to monitor and treat . 11-14-2021: States compliance with checking blood sugars. Denies any lows and states the lowest she has seen is 92. Highest is 243 and that was an isolated incident. She is mindful of her blood sugar readings and does not want to go back on the "needle". Education and support given.   12-26-2021: The patient states her blood sugars have been good. Did not provide readings today. Last one on 12-13-2021 was 134 fasting. The patient was a little stressed today over transportation needs. Education and support provided.  01-30-2022: The patient denies any real lows or real highs. Range has been 82 to 187.  ?Reviewed scheduled/upcoming provider appointments including: 01-31-2022 at 0900 am with pcp ?Advised patient, providing education and rationale, to check cbg before  meals and at bedtime, when you have symptoms of low or high blood sugar, and before and after exercise and record. 10-10-2021: The patient takes blood sugars and records. Denies any acute findings.   01-30-2022: Takes readings on a consistent basis. See chart below for most recent readings:    ?BS readings        ?01/17/2022 116 01/27/2022 111  ?01/18/2022 187 01/28/2022 132  ?01/19/2022 99 01/30/2022 165  ?01/20/2022 111      ?01/21/2022 102      ?01/22/2022 82      ?01/23/2022 118      ?call provider for findings outside established parameters;       ?Review of patient status, including review of consultants reports, relevant laboratory and other test results, and medications completed;       ?Review of last eye exam. 11-14-2021: The patient states it has been about a year and a half since she has had an eye exam. Ask the patient to get an appointment for follow up with her eye doctor and the importance of regular eye exams.  ?  ?Depression  (Status: Goal on Track (progressing): YES.) Long Term Goal  ?Evaluation of current treatment plan related to Depression, Mental Health Concerns  self-management and patient's adherence to plan as established by provider. 10-10-2021: The patient denies any acute distress. She is not feeling the best right now due to her recent visit to  the ER and diagnosis of bronchitis. She also has appointments coming up to have her rotator cuff evaluated. The patient states that she is just wanting to feel better. Denies any mental health needs at this time. 11-14-2021: The patient states that she is doing well and denies any issues with depression or anxiety at this time. Did have a fall a week ago Saturday. States that she is okay and not injured. Discussed safety and falls prevention. Also encouraged the patient to continue to work with CCM team for needs and concerns. The patient is happy she has the CCM team for ongoing support and education. Will continue to monitor. 12-26-2021: The patient was stressed out  and frustrated today. She was having to call and cancel her appointments for this month because she changed insurance plans and does not have transportation. The patient states that she cannot make the

## 2022-01-30 NOTE — Telephone Encounter (Signed)
Gave pt the information to [facility] to arrange transportation.  ? ?Thanks,  ? ?-Mickel Baas  ?

## 2022-01-30 NOTE — Chronic Care Management (AMB) (Signed)
?Chronic Care Management  ? ?CCM RN Visit Note ? ?01/30/2022 ?Name: Kendra [name] MRN: [id number] DOB: Apr 02, 1946 ? ?Subjective: ?Kendra [name] is a 76 y.o. year old female who is a primary care patient of [name], NP. The care management team was consulted for assistance with disease management and care coordination needs.   ? ?Engaged with patient by telephone for follow up visit in response to provider referral for case management and/or care coordination services.  ? ?Consent to Services:  ?The patient was given information about Chronic Care Management services, agreed to services, and gave verbal consent prior to initiation of services.  Please see initial visit note for detailed documentation.  ? ?Patient agreed to services and verbal consent obtained.  ? ?Assessment: Review of patient past medical history, allergies, medications, health status, including review of consultants reports, laboratory and other test data, was performed as part of comprehensive evaluation and provision of chronic care management services.  ? ?SDOH (Social Determinants of Health) assessments and interventions performed:   ? ?CCM Care Plan ? ?Allergies  ?Allergen Reactions  ? Citalopram Nausea And Vomiting  ? ? ?Outpatient Encounter Medications as of 01/30/2022  ?Medication Sig  ? ACCU-CHEK GUIDE test strip USE AS DIRECTED. UP TO 4 TIMES DAILY ONCE DAILY  ? Accu-Chek Softclix Lancets lancets USE AS DIRECTED. UP TO 4 TIMES A DAY  ? Acetaminophen (TYLENOL 8 HOUR ARTHRITIS PAIN PO) Take 2 capsules by mouth in the morning, at noon, in the evening, and at bedtime.  ? albuterol (VENTOLIN HFA) 108 (90 Base) MCG/ACT inhaler INHALE 1-2 PUFFS BY MOUTH EVERY 6 HOURS AS NEEDED WHEEZING/ SHORTNESS OF BREATH  ? aspirin EC 81 MG tablet Take 1 tablet (81 mg total) by mouth daily. Swallow whole.  ? atorvastatin (LIPITOR) 40 MG tablet TAKE 1 TABLET BY MOUTH BEDTIME  ? blood glucose meter kit and supplies KIT Dispense based on patient and  insurance preference. Use up to four times daily as directed.  ? Blood Glucose Monitoring Suppl (ONETOUCH VERIO) w/Device KIT USE AS DIRECTED  ? buPROPion (WELLBUTRIN XL) 150 MG 24 hr tablet TAKE 1 TABLET BY MOUTH ONCE DAILY  ? clopidogrel (PLAVIX) 75 MG tablet TAKE 1 TABLET BY MOUTH ONCE DAILY  ? cyclobenzaprine (FLEXERIL) 5 MG tablet Take 1 tablet (5 mg total) by mouth 3 (three) times daily as needed for muscle spasms. (Patient not taking: Reported on 01/04/2022)  ? docusate sodium (COLACE) 100 MG capsule Take 1 capsule (100 mg total) by mouth 2 (two) times daily as needed for mild constipation.  ? Lancet Devices (ONE TOUCH DELICA LANCING DEV) MISC 1 Device by Does not apply route 2 (two) times daily.  ? metFORMIN (GLUCOPHAGE) 500 MG tablet TAKE 1 TABLET BY MOUTH TWICE DAILY WITH A MEAL  ? traZODone (DESYREL) 50 MG tablet Take 0.5-1 tablets (25-50 mg total) by mouth at bedtime as needed for sleep.  ? TRELEGY ELLIPTA 100-62.5-25 MCG/ACT AEPB USE ONE INHALATION INTO THE LUNGS ONCE A DAY RINSE MOUTH AFTER EACH USE  ? ?No facility-administered encounter medications on file as of 01/30/2022.  ? ? ?Patient Active Problem List  ? Diagnosis Date Noted  ? Aortic atherosclerosis ([place]) 05/31/2021  ? COPD (chronic obstructive pulmonary disease) ([place]) 05/31/2021  ? Anemia 05/31/2021  ? Hemiparesis affecting left side as late effect of stroke ([place]) 02/02/2021  ? Recurrent major depressive disorder, in partial remission ([place]) 02/02/2021  ? Carotid artery stenosis 12/30/2019  ? Hypertension associated with diabetes ([place]) 12/19/2018  ?  Diabetes mellitus type 2, controlled, with complications ([place]) 79/10/4095  ? Insomnia 03/30/2015  ? Hyperlipidemia associated with type 2 diabetes mellitus ([place]) 03/30/2015  ? Gastro-esophageal reflux disease without esophagitis 03/30/2015  ? Osteoarthritis 03/30/2015  ? Osteopenia 03/30/2015  ? B12 deficiency 03/30/2015  ? ? ?Conditions to be addressed/monitored:HTN, HLD, COPD, DMII, Depression,  Osteoarthritis, and blood in urine ? ?Care Plan : RNCM: General Plan of Care (Adult) for Chronic Disease Management and Care Coordination Needs  ?Updates made by Vanita Ingles, RN since 01/30/2022 12:00 AM  ?  ? ?Problem: RNCM: Development of Plan of Care For Chronic Disease Management (HTN, HLD, DM, OA, COPD, Depression)   ?Priority: High  ?  ? ?Long-Range Goal: RNCM: Effective Management  of Plan of Care For Chronic Disease Management (HTN, HLD, DM, OA, COPD, Depression)   ?Start Date: 08/08/2021  ?Expected End Date: 08/08/2022  ?Priority: High  ?Note:   ?Current Barriers:  ?Knowledge Deficits related to plan of care for management of HTN, HLD, COPD, DMII, Depression: depressed mood ?anxiety, and chronic pain with OA to right shoulder  ?Care Coordination needs related to Limited social support, Mental Health Concerns , and Literacy concerns  ?Chronic Disease Management support and education needs related to HTN, HLD, COPD, DMII, Depression: depressed mood ?anxiety, and Chronic Pain/OA ?Lacks caregiver support.        ? ?RNCM Clinical Goal(s):  ?Patient will verbalize basic understanding of HTN, HLD, COPD, DMII, Depression, and Osteoarthritis disease process and self health management plan as evidenced by following plan of care, keeping appointments, calling the provider office for changes in conditions and working with the CCM team to optimize health and well being  ?take all medications exactly as prescribed and will call provider for medication related questions as evidenced by compliance with medications and calling the office for refill needs     ?attend all scheduled medical appointments: pcp appointments and specialist appointments as evidenced by keeping appointments and calling the office for needed schedule changes        ?continue to work with Consulting civil engineer and/or Social Worker to address care management and care coordination needs related to HTN, HLD, COPD, DMII, Depression, and Osteoarthritis as  evidenced by adherence to CM Team Scheduled appointments     ?demonstrate a decrease in HTN, HLD, COPD, DMII, Depression, and Osteoarthritis exacerbations  as evidenced by following the plan of care and calling for any changes or acute exacerbations in chronic conditions  ?demonstrate ongoing self health care management ability for effective management of Chronic conditions  as evidenced by  working with the CCM team through collaboration with Consulting civil engineer, provider, and care team.  ? ?Interventions: ?1:1 collaboration with primary care provider regarding development and update of comprehensive plan of care as evidenced by provider attestation and co-signature ?Inter-disciplinary care team collaboration (see longitudinal plan of care) ?Evaluation of current treatment plan related to  self management and patient's adherence to plan as established by provider ? ? ?COPD: (Status: Goal on Track (progressing): YES.) Long Term Goal  ?Reviewed medications with patient, including use of prescribed maintenance and rescue inhalers, and provided instruction on medication management and the importance of adherence. 01-30-2022: The patient is compliant with medications. Denies any medications needs today ?Provided patient with basic written and verbal COPD education on self care/management/and exacerbation prevention. 10-10-2021: The patient was in the ER for evaluation of chest pain on 10-06-2021. The patient states that they told her she had "bronchitis" but did not give  her any medications. The patient states she has a runny nose and the drainage is clear. Denies fever or chills, education and appointment secured for the patient to talk to pcp on 10-11-2021 via phone at 4 pm. Review and confirmed with the patient. 11-14-2021: The patient states she is doing better and denies any issues related to COPD or changes in her breathing. She has stopped smoking and continues to refrain from smoking. Praised the patient for positive  changes made for her health and well being. The patient says it is hard not to smoke but she is dedicated to refrain from smoking. 01-30-2022: The patient denies any exacerbations with her COPD. States her conditi

## 2022-01-30 NOTE — Telephone Encounter (Signed)
Will discuss at upcoming appt, do we assist with transportation? ?

## 2022-01-31 ENCOUNTER — Encounter: Payer: Self-pay | Admitting: Internal Medicine

## 2022-01-31 ENCOUNTER — Ambulatory Visit: Payer: Medicaid Other | Admitting: Internal Medicine

## 2022-01-31 ENCOUNTER — Other Ambulatory Visit: Payer: 59 | Admitting: Internal Medicine

## 2022-01-31 DIAGNOSIS — R310 Gross hematuria: Secondary | ICD-10-CM | POA: Diagnosis not present

## 2022-01-31 LAB — POCT URINALYSIS DIPSTICK
Bilirubin, UA: NEGATIVE
Glucose, UA: NEGATIVE
Ketones, UA: NEGATIVE
Protein, UA: POSITIVE — AB
Spec Grav, UA: >=1.03 — AB (ref 1.010–1.025)
Urobilinogen, UA: 0.2 E.U./dL
pH, UA: 6 (ref 5.0–8.0)

## 2022-01-31 MED ORDER — NITROFURANTOIN MONOHYD MACRO 100 MG PO CAPS: 100.0000 mg | ORAL_CAPSULE | Freq: Two times a day (BID) | ORAL | 0 refills | Status: DC

## 2022-01-31 NOTE — Addendum Note (Signed)
Addended by: Ashley Royalty E on: 01/31/2022 04:04 PM ? ? Modules accepted: Orders ? ?

## 2022-01-31 NOTE — Progress Notes (Signed)
? ?Subjective:  ? ? Patient ID: Kendra [name], female    DOB: November 06, 1945, 76 y.o.   MRN: [id number] ? ?HPI ? ?Patient presents to clinic today with complaint of blood in her urine. She noticed this 2 days ago. She reports some associated pelvic pain but denies urgency, frequency, dysuria, vaginal discharge, odor, irritation or abnormal vaginal bleeding. She still has her uterus but has not had a period in years. She denies family history of ovarian, uterine or bladder cancer. She does smoke. ? ?Review of Systems ? ?   ?Past Medical History:  ?Diagnosis Date  ? Asthma   ? Back pain   ? Cataract   ? Cervical disc disorder   ? COPD (chronic obstructive pulmonary disease) ([place])   ? Depression   ? Diabetes ([place])   ? type II  ? Diabetic neuropathy ([place])   ? Dyspnea   ? with exertion  ? GERD (gastroesophageal reflux disease)   ? History of kidney stones   ? per patient "can't remember the year"  ? Hyperlipidemia   ? Hypertension   ? per patient "take meds for it"  ? Insomnia   ? Neuropathy   ? Osteoarthritis   ? Osteopenia   ? Pneumonia 2018  ? per patient  ? Reflux   ? Rheumatoid arthritis ([place])   ? Stroke [facility]) 2015  ? and again 2017  - Weakness in left leg, staggers w/ walking, vision   ? Tenosynovitis   ? ? ?Current Outpatient Medications  ?Medication Sig Dispense Refill  ? ACCU-CHEK GUIDE test strip USE AS DIRECTED. UP TO 4 TIMES DAILY ONCE DAILY 400 each 1  ? Accu-Chek Softclix Lancets lancets USE AS DIRECTED. UP TO 4 TIMES A DAY 400 each 1  ? Acetaminophen (TYLENOL 8 HOUR ARTHRITIS PAIN PO) Take 2 capsules by mouth in the morning, at noon, in the evening, and at bedtime.    ? albuterol (VENTOLIN HFA) 108 (90 Base) MCG/ACT inhaler INHALE 1-2 PUFFS BY MOUTH EVERY 6 HOURS AS NEEDED WHEEZING/ SHORTNESS OF BREATH 8.5 g 1  ? aspirin EC 81 MG tablet Take 1 tablet (81 mg total) by mouth daily. Swallow whole. 90 tablet 1  ? atorvastatin (LIPITOR) 40 MG tablet TAKE 1 TABLET BY MOUTH BEDTIME 90 tablet 0  ? blood glucose  meter kit and supplies KIT Dispense based on patient and insurance preference. Use up to four times daily as directed. 1 each 0  ? Blood Glucose Monitoring Suppl (ONETOUCH VERIO) w/Device KIT USE AS DIRECTED 1 kit 0  ? buPROPion (WELLBUTRIN XL) 150 MG 24 hr tablet TAKE 1 TABLET BY MOUTH ONCE DAILY 90 tablet 0  ? clopidogrel (PLAVIX) 75 MG tablet TAKE 1 TABLET BY MOUTH ONCE DAILY 90 tablet 0  ? cyclobenzaprine (FLEXERIL) 5 MG tablet Take 1 tablet (5 mg total) by mouth 3 (three) times daily as needed for muscle spasms. (Patient not taking: Reported on 01/04/2022) 30 tablet 2  ? docusate sodium (COLACE) 100 MG capsule Take 1 capsule (100 mg total) by mouth 2 (two) times daily as needed for mild constipation. 60 capsule 2  ? Lancet Devices (ONE TOUCH DELICA LANCING DEV) MISC 1 Device by Does not apply route 2 (two) times daily. 200 each 4  ? metFORMIN (GLUCOPHAGE) 500 MG tablet TAKE 1 TABLET BY MOUTH TWICE DAILY WITH A MEAL 180 tablet 0  ? traZODone (DESYREL) 50 MG tablet Take 0.5-1 tablets (25-50 mg total) by mouth at bedtime as needed for sleep.  90 tablet 0  ? TRELEGY ELLIPTA 100-62.5-25 MCG/ACT AEPB USE ONE INHALATION INTO THE LUNGS ONCE A DAY RINSE MOUTH AFTER EACH USE 60 each 1  ? ?No current facility-administered medications for this visit.  ? ? ?Allergies  ?Allergen Reactions  ? Citalopram Nausea And Vomiting  ? ? ?Family History  ?Problem Relation Age of Onset  ? Stroke Father   ? Depression Father   ? Diabetes Mother   ? Hyperlipidemia Mother   ? Breast cancer Mother   ? Heart murmur Son   ? Heart murmur Son   ? Heart murmur Son   ? ? ?Social History  ? ?Socioeconomic History  ? Marital status: Widowed  ?  Spouse name: Not on file  ? Number of children: 3  ? Years of education: 37  ? Highest education level: Not on file  ?Occupational History  ? Occupation: Disabled  ?Tobacco Use  ? Smoking status: Some Days  ?  Packs/day: 0.25  ?  Years: 61.00  ?  Pack years: 15.25  ?  Types: Cigarettes  ? Smokeless tobacco:  Former  ?  Types: Snuff  ?Vaping Use  ? Vaping Use: Never used  ?Substance and Sexual Activity  ? Alcohol use: No  ? Drug use: No  ? Sexual activity: Never  ?Other Topics Concern  ? Not on file  ?Social History Narrative  ? Not on file  ? ?Social Determinants of Health  ? ?Financial Resource Strain: Medium Risk  ? Difficulty of Paying Living Expenses: Somewhat hard  ?Food Insecurity: No Food Insecurity  ? Worried About Charity fundraiser in the Last Year: Never true  ? Ran Out of Food in the Last Year: Never true  ?Transportation Needs: No Transportation Needs  ? Lack of Transportation (Medical): No  ? Lack of Transportation (Non-Medical): No  ?Physical Activity: Inactive  ? Days of Exercise per Week: 0 days  ? Minutes of Exercise per Session: 0 min  ?Stress: Stress Concern Present  ? Feeling of Stress : Rather much  ?Social Connections: Socially Isolated  ? Frequency of Communication with Friends and Family: More than three times a week  ? Frequency of Social Gatherings with Friends and Family: More than three times a week  ? Attends Religious Services: Never  ? Active Member of Clubs or Organizations: No  ? Attends Archivist Meetings: Never  ? Marital Status: Widowed  ?Intimate Partner Violence: Not At Risk  ? Fear of Current or Ex-Partner: No  ? Emotionally Abused: No  ? Physically Abused: No  ? Sexually Abused: No  ? ? ? ?Constitutional: Denies fever, malaise, fatigue, headache or abrupt weight changes.  ?Respiratory: Denies difficulty breathing, shortness of breath, cough or sputum production.   ?Cardiovascular: Denies chest pain, chest tightness, palpitations or swelling in the hands or feet.  ?Gastrointestinal: Pt reports pelvic pain. Denies bloating, constipation, diarrhea or blood in the stool.  ?GU: Patient reports blood in urine.  Denies urgency, frequency, pain with urination, burning sensation, odor or discharge. ? ?No other specific complaints in a complete review of systems (except as  listed in HPI above). ? ?Objective:  ? Physical Exam ? ? ?There were no vitals taken for this visit. ? ?Wt Readings from Last 3 Encounters:  ?08/01/21 130 lb 3.2 oz (59.1 kg)  ?06/28/21 129 lb 8 oz (58.7 kg)  ?06/20/21 131 lb (59.4 kg)  ? ? ?General: Appears her stated age, chronically ill appearing, in NAD. ?Cardiovascular: Normal rate and  rhythm. S1,S2 noted.  No murmur, rubs or gallops noted.  ?Pulmonary/Chest: Normal effort and positive vesicular breath sounds. No respiratory distress. No wheezes, rales or ronchi noted.  ?Abdomen: Soft and nontender. Normal bowel sounds. No distention or masses noted. No CVA tenderness noted. ?Neurological: Alert and oriented.  ? ?BMET ?   ?Component Value Date/Time  ? NA 137 01/24/2022 0904  ? NA 141 01/18/2016 1150  ? NA 138 09/01/2014 2130  ? K 3.6 01/24/2022 0904  ? K 3.5 09/01/2014 2130  ? CL 102 01/24/2022 0904  ? CL 104 09/01/2014 2130  ? CO2 27 01/24/2022 0904  ? CO2 28 09/01/2014 2130  ? GLUCOSE 129 (H) 01/24/2022 0904  ? GLUCOSE 287 (H) 09/01/2014 2130  ? BUN 23 01/24/2022 0904  ? BUN 12 01/18/2016 1150  ? BUN 14 09/01/2014 2130  ? CREATININE 0.83 01/24/2022 0904  ? CREATININE 0.85 08/01/2021 1021  ? CALCIUM 9.7 01/24/2022 0904  ? CALCIUM 8.9 09/01/2014 2130  ? GFRNONAA >60 01/24/2022 0904  ? GFRNONAA 70 06/14/2020 0929  ? GFRAA 82 06/14/2020 0929  ? ? ?Lipid Panel  ?   ?Component Value Date/Time  ? CHOL 135 08/01/2021 1021  ? CHOL 177 06/07/2015 1215  ? CHOL 141 09/02/2014 0440  ? TRIG 89 08/01/2021 1021  ? TRIG 155 09/02/2014 0440  ? HDL 65 08/01/2021 1021  ? HDL 56 06/07/2015 1215  ? HDL 37 (L) 09/02/2014 0440  ? CHOLHDL 2.1 08/01/2021 1021  ? VLDL 16 10/17/2019 0606  ? VLDL 31 09/02/2014 0440  ? LDLCALC 53 08/01/2021 1021  ? [place] 73 09/02/2014 0440  ? ? ?CBC ?   ?Component Value Date/Time  ? WBC 7.7 01/24/2022 0904  ? RBC 3.78 (L) 01/24/2022 0904  ? HGB 11.9 (L) 01/24/2022 0904  ? HGB 12.7 09/01/2014 2130  ? HCT 36.4 01/24/2022 0904  ? HCT 37.9 09/01/2014 2130   ? PLT 252 01/24/2022 0904  ? PLT 260 09/05/2014 0517  ? MCV 96.3 01/24/2022 0904  ? MCV 92 09/01/2014 2130  ? MCH 31.5 01/24/2022 0904  ? MCHC 32.7 01/24/2022 0904  ? RDW 13.2 01/24/2022 0904  ? RDW 13.9 12/15/

## 2022-01-31 NOTE — Patient Instructions (Signed)

## 2022-01-31 NOTE — Addendum Note (Signed)
Addended by: [name] on: 01/31/2022 11:58 AM ? ? Modules accepted: Orders ? ?

## 2022-02-01 LAB — URINE CULTURE
MICRO NUMBER:: 13403349
SPECIMEN QUALITY:: ADEQUATE

## 2022-02-02 ENCOUNTER — Other Ambulatory Visit: Payer: Self-pay

## 2022-02-02 ENCOUNTER — Emergency Department: Payer: 59

## 2022-02-02 ENCOUNTER — Emergency Department
Admission: EM | Admit: 2022-02-02 | Discharge: 2022-02-02 | Disposition: A | Discharge status: Home / Self Care | Payer: 59 | Attending: Emergency Medicine | Admitting: Emergency Medicine

## 2022-02-02 DIAGNOSIS — D649 Anemia, unspecified: Secondary | ICD-10-CM | POA: Insufficient documentation

## 2022-02-02 DIAGNOSIS — E119 Type 2 diabetes mellitus without complications: Secondary | ICD-10-CM | POA: Diagnosis not present

## 2022-02-02 DIAGNOSIS — J449 Chronic obstructive pulmonary disease, unspecified: Secondary | ICD-10-CM | POA: Diagnosis not present

## 2022-02-02 DIAGNOSIS — R8279 Other abnormal findings on microbiological examination of urine: Secondary | ICD-10-CM | POA: Insufficient documentation

## 2022-02-02 DIAGNOSIS — R319 Hematuria, unspecified: Secondary | ICD-10-CM

## 2022-02-02 DIAGNOSIS — I10 Essential (primary) hypertension: Secondary | ICD-10-CM | POA: Insufficient documentation

## 2022-02-02 DIAGNOSIS — N3289 Other specified disorders of bladder: Secondary | ICD-10-CM

## 2022-02-02 DIAGNOSIS — K625 Hemorrhage of anus and rectum: Secondary | ICD-10-CM | POA: Diagnosis present

## 2022-02-02 LAB — COMPREHENSIVE METABOLIC PANEL
ALT: 16 U/L (ref 0–44)
AST: 21 U/L (ref 15–41)
Albumin: 4.2 g/dL (ref 3.5–5.0)
Alkaline Phosphatase: 51 U/L (ref 38–126)
Anion gap: 9 (ref 5–15)
BUN: 13 mg/dL (ref 8–23)
CO2: 25 mmol/L (ref 22–32)
Calcium: 9.9 mg/dL (ref 8.9–10.3)
Chloride: 106 mmol/L (ref 98–111)
Creatinine, Ser: 0.84 mg/dL (ref 0.44–1.00)
GFR, Estimated: >60 mL/min (ref >60)
Glucose, Bld: 102 mg/dL — ABNORMAL HIGH (ref 70–99)
Potassium: 4 mmol/L (ref 3.5–5.1)
Sodium: 140 mmol/L (ref 135–145)
Total Bilirubin: 0.6 mg/dL (ref 0.3–1.2)
Total Protein: 7 g/dL (ref 6.5–8.1)

## 2022-02-02 LAB — URINALYSIS, ROUTINE W REFLEX MICROSCOPIC
Bacteria, UA: NONE SEEN
RBC / HPF: >50 RBC/hpf — ABNORMAL HIGH (ref 0–5)
Specific Gravity, Urine: 1.025 (ref 1.005–1.030)
Squamous Epithelial / HPF: NONE SEEN (ref 0–5)

## 2022-02-02 LAB — CBC
HCT: 33.2 % — ABNORMAL LOW (ref 36.0–46.0)
Hemoglobin: 10.6 g/dL — ABNORMAL LOW (ref 12.0–15.0)
MCH: 31 pg (ref 26.0–34.0)
MCHC: 31.9 g/dL (ref 30.0–36.0)
MCV: 97.1 fL (ref 80.0–100.0)
Platelets: 267 10*3/uL (ref 150–400)
RBC: 3.42 MIL/uL — ABNORMAL LOW (ref 3.87–5.11)
RDW: 13.6 % (ref 11.5–15.5)
WBC: 4.9 10*3/uL (ref 4.0–10.5)
nRBC: 0 % (ref 0.0–0.2)

## 2022-02-02 LAB — PROTIME-INR
INR: 1.2 (ref 0.8–1.2)
Prothrombin Time: 14.7 seconds (ref 11.4–15.2)

## 2022-02-02 LAB — TYPE AND SCREEN
ABO/RH(D): O POS
Antibody Screen: NEGATIVE

## 2022-02-02 MED ORDER — IOHEXOL 300 MG/ML  SOLN
100.0000 mL | Freq: Once | INTRAMUSCULAR | Status: AC | PRN
  Administered 2022-02-02: 100 mL via INTRAVENOUS

## 2022-02-02 NOTE — ED Notes (Signed)
This RN spoke to [place] (pts sister) at [phone] for ride home. Kendra [name] states that her family just called for a taxi to pick her up. Pt brought out of department in wheelchair to lobby to wait for taxi.

## 2022-02-02 NOTE — ED Triage Notes (Signed)
Pt with c/o rectal bleeding x 2 days, pt states she is on blood thinner. [place], Pt states she is seeing bright red blood and clots.

## 2022-02-02 NOTE — ED Provider Notes (Signed)
[facility] Provider Note    Event Date/Time   First MD Initiated Contact with Patient 02/02/22 1620     (approximate)   History   Rectal Bleeding   HPI  Kendra [name] is a 76 y.o. female with COPD, prior stroke with left-sided deficits, hypertension, diabetes who comes in with concerns for rectal bleeding.  Patient reports that she will go to the bathroom and she will see blood which she thinks is in her stool.  She reports has been going on for 2 days.  She denies any current abdominal discomfort.  I reviewed the records were patient was seen by her primary care doctor on 01/31/2022 it was noted that patient had gross hematuria and given her 5-day course of Macrobid and ordered a CT scan to further evaluate.  Physical Exam   Triage Vital Signs: ED Triage Vitals  Enc Vitals Group     BP 02/02/22 1339 108/71     Pulse Rate 02/02/22 1339 79     Resp 02/02/22 1339 17     Temp 02/02/22 1339 98.3 F (36.8 C)     Temp Source 02/02/22 1339 Oral     SpO2 02/02/22 1339 99 %     Weight 02/02/22 1346 120 lb (54.4 kg)     Height 02/02/22 1346 '5\' 4"'$  (1.626 m)     Head Circumference --      Peak Flow --      Pain Score 02/02/22 1346 8     Pain Loc --      Pain Edu? --      Excl. in [place]? --     Most recent vital signs: Vitals:   02/02/22 1339  BP: 108/71  Pulse: 79  Resp: 17  Temp: 98.3 F (36.8 C)  SpO2: 99%     General: Awake, no distress.  CV:  Good peripheral perfusion.  Resp:  Normal effort.  Abd:  No distention. Soft nontender Other:  Patient had brown stool Hemoccult negative.  Vaginal exam done without any blood noted.   ED Results / Procedures / Treatments   Labs (all labs ordered are listed, but only abnormal results are displayed) Labs Reviewed  COMPREHENSIVE METABOLIC PANEL - Abnormal; Notable for the following components:      Result Value   Glucose, Bld 102 (*)    All other components within normal limits  CBC -  Abnormal; Notable for the following components:   RBC 3.42 (*)    Hemoglobin 10.6 (*)    HCT 33.2 (*)    All other components within normal limits  PROTIME-INR  URINALYSIS, ROUTINE W REFLEX MICROSCOPIC  POC OCCULT BLOOD, ED  TYPE AND SCREEN     EKG  My interpretation of EKG:  Normal sinus rate of 79, no st elevation, no twi, normal intervals.   RADIOLOGY I have reviewed the CT personally interpreted and some possible masses in the bladder but no evidence of any retention pending the radiology review   No acute intra-abdominal pathology identified. No definite radiographic explanation for the patient's reported symptoms.  Multiple hyperdense mural masses within the bladder lumen, suspicious for multiple foci of urothelial neoplasia. Correlation with cystoscopy is recommended.  1 cm left adrenal mass, probable benign adenoma. Recommend 1 year follow up adrenal washout CT. If stable for > 1 year, no further f/u imaging.   PROCEDURES:  Critical Care performed: No  Procedures   MEDICATIONS ORDERED IN ED: Medications - No data to display  IMPRESSION / MDM / ASSESSMENT AND PLAN / ED COURSE  I reviewed the triage vital signs and the nursing notes.  Patient comes in with concern for rectal bleeding.  This could be an acute life-threatening pathology and differential includes upper GIB, lower GI bleed.  She denies ever having a colonoscopy.  However on my rectal exam she has no current blood noted.  Therefore I did a vaginal exam without any blood as well.  I then saw that patient had a note 2 days ago coming on hematuria so we will have her give a urine sample to see if that could be where the blood is coming from  Patient's urine does look like it has blood in it therefore I do think this is the source of bleeding.  We will get bladder scan to make sure no retention Hemoglobin is 11.9 from 9 days ago and downtrending to 10.6 CMP shows stable kidney function.  Patient's  primary care doctor had ordered a CT scan so we will proceed with that.  She reports being on the [place]. Sent for culture but I did review the culture from 2 days ago that she has showed mixed flora so primary care doctor wanted to stop the antibiotic.  CT scan is concerning for masses in her bladder.  Suspect this is what is causing her hematuria.  We will discuss the case with urology Dr. Bernardo Heater.  Postvoid bladder scan was 30 cc so no evidence of any retention at this time  Patient provided copy of report for incidental findings   Considered admission and discuss this with pt for pt given hematuria on plaxix but given no retention pt would prefer to go home and return if develops urinary retention or weakness for recheck hemoglobin.  We discussed about the potential holding the Plavix but at this time she like to hold off given high risk for stroke given she had 2 prior strokes but she understands that if bleeding worsens or she develops clots and retention she may have to hold it.      FINAL CLINICAL IMPRESSION(S) / ED DIAGNOSES   Final diagnoses:  Hematuria, unspecified type  Bladder mass     Rx / DC Orders   ED Discharge Orders     None        Note:  This document was prepared using Dragon voice recognition software and may include unintentional dictation errors.   Vanessa Presidio, MD 02/02/22 [phone]

## 2022-02-02 NOTE — ED Notes (Signed)
Bladder scan completed, 25 ml of fluid.

## 2022-02-02 NOTE — ED Triage Notes (Signed)
First Nurse Note:  Pt via EMS from home. Pt c/o rectal bleeding for the last 3 days. Pt had 1 episode of diarrhea. Pt is A&Ox4 and NAD  124/74 94% on RA 90 HR

## 2022-02-02 NOTE — Discharge Instructions (Addendum)
Your CT scan is as below and concerning for a bladder mass that needs to be followed up with urology.  Please call them tomorrow to make an appointment.  We discussed holding her Plavix but there is a risk for stroke so I will continue it but if you developed increasing weakness or shortness of breath you need to return to the ER immediately for recheck of your hemoglobin to make sure is not going down further.  Also if you stop urinating you need to come back for Foley placement if things do get worse we would have you hold your Plavix at that time.  Otherwise you can stop the antibiotic that was prescribed 2 days ago given your urine culture was negative  No acute intra-abdominal pathology identified. No definite radiographic explanation for the patient's reported symptoms.  Multiple hyperdense mural masses within the bladder lumen, suspicious for multiple foci of urothelial neoplasia. Correlation with cystoscopy is recommended.  1 cm left adrenal mass, probable benign adenoma. Recommend 1 year follow up adrenal washout CT. If stable for > 1 year, no further f/u imaging.

## 2022-02-04 LAB — URINE CULTURE: Culture: <10000 — AB

## 2022-02-07 ENCOUNTER — Other Ambulatory Visit: Payer: Self-pay | Admitting: Internal Medicine

## 2022-02-08 ENCOUNTER — Ambulatory Visit
Admission: RE | Admit: 2022-02-08 | Discharge: 2022-02-08 | Disposition: A | Discharge status: Home / Self Care | Payer: 59 | Source: Ambulatory Visit | Attending: Internal Medicine | Admitting: Internal Medicine

## 2022-02-08 ENCOUNTER — Encounter: Payer: Self-pay | Admitting: Oncology

## 2022-02-08 DIAGNOSIS — R310 Gross hematuria: Secondary | ICD-10-CM | POA: Diagnosis present

## 2022-02-08 MED ORDER — IOHEXOL 300 MG/ML  SOLN
100.0000 mL | Freq: Once | INTRAMUSCULAR | Status: AC | PRN
  Administered 2022-02-08: 100 mL via INTRAVENOUS

## 2022-02-08 NOTE — Telephone Encounter (Signed)
Requested Prescriptions  Pending Prescriptions Disp Refills  . clopidogrel (PLAVIX) 75 MG tablet [Pharmacy Med Name: CLOPIDOGREL BISULFATE 75 MG TAB] 90 tablet 0    Sig: TAKE 1 TABLET BY MOUTH ONCE DAILY     Hematology: Antiplatelets - clopidogrel Failed - 02/07/2022  3:08 PM      Failed - HCT in normal range and within 180 days    HCT  Date Value Ref Range Status  02/02/2022 33.2 (L) 36.0 - 46.0 % Final  09/01/2014 37.9 35.0 - 47.0 % Final         Failed - HGB in normal range and within 180 days    Hemoglobin  Date Value Ref Range Status  02/02/2022 10.6 (L) 12.0 - 15.0 g/dL Final   HGB  Date Value Ref Range Status  09/01/2014 12.7 12.0 - 16.0 g/dL Final         Passed - PLT in normal range and within 180 days    Platelets  Date Value Ref Range Status  02/02/2022 267 150 - 400 K/uL Final   Platelet  Date Value Ref Range Status  09/05/2014 260 150 - 440 x10 3/mm 3 Final         Passed - Cr in normal range and within 360 days    Creat  Date Value Ref Range Status  08/01/2021 0.85 0.60 - 1.00 mg/dL Final   Creatinine, Ser  Date Value Ref Range Status  02/02/2022 0.84 0.44 - 1.00 mg/dL Final   Creatinine, Urine  Date Value Ref Range Status  05/31/2021 89 20 - 275 mg/dL Final         Passed - Valid encounter within last 6 months    Recent Outpatient Visits          4 months ago Nonspecific chest pain   [facility], Coralie Keens, NP   4 months ago Chest pain, unspecified type   [place], Coralie Keens, NP   6 months ago Controlled type 2 diabetes mellitus with complication, without long-term current use of insulin [facility])   [name] [name], NP   7 months ago Chronic right shoulder pain   [facility] [name], NP   8 months ago Aortic atherosclerosis [name])   [facility], Coralie Keens, NP      Future Appointments            Tomorrow Bernardo Heater, Ronda Fairly, [facility]

## 2022-02-09 ENCOUNTER — Encounter: Payer: Self-pay | Admitting: Oncology

## 2022-02-09 ENCOUNTER — Encounter: Payer: Self-pay | Admitting: Urology

## 2022-02-09 ENCOUNTER — Ambulatory Visit (INDEPENDENT_AMBULATORY_CARE_PROVIDER_SITE_OTHER): Payer: 59 | Admitting: Urology

## 2022-02-09 VITALS — BP 97/66 | HR 108 | Ht 64.0 in | Wt 113.0 lb

## 2022-02-09 DIAGNOSIS — R310 Gross hematuria: Secondary | ICD-10-CM

## 2022-02-09 LAB — URINALYSIS, COMPLETE
Bilirubin, UA: NEGATIVE
Glucose, UA: NEGATIVE
Ketones, UA: NEGATIVE
Leukocytes,UA: NEGATIVE
Nitrite, UA: NEGATIVE
Specific Gravity, UA: 1.03 (ref 1.005–1.030)
Urobilinogen, Ur: 0.2 mg/dL (ref 0.2–1.0)
pH, UA: 5.5 (ref 5.0–7.5)

## 2022-02-09 LAB — MICROSCOPIC EXAMINATION: RBC, Urine: >30 /hpf — AB (ref 0–2)

## 2022-02-09 NOTE — Progress Notes (Signed)
02/09/2022 5:33 PM   Kendra [name] [birth year] [id number]  Referring provider: [name], NP [address] [place],  [place] [address]  Chief Complaint  Patient presents with   Hematuria    HPI: Kendra [name] is a 76 y.o. female presents in follow-up for recent ED visit for gross hematuria and bladder mass.  Saw her PCP 01/31/2022 for gross hematuria. Treated empirically with Macrobid; urine culture without significant growth CT urogram was ordered She presented to the ED 02/02/2022 complaining of worsening gross hematuria.  History of multiple CVA and currently on Plavix CT with contrast was performed which showed multiple nonobstructing bilateral renal calculi.  There were multiple hyperdense bladder lesions that were suspicious for urothelial neoplasms.  Also noted to have a fusiform 3.4 cm infrarenal abdominal aortic aneurysm She held her Plavix and hematuria has resolved.  She has not restarted She does have urinary frequency, urgency with urge incontinence CT urogram was performed yesterday with results pending   PMH: Past Medical History:  Diagnosis Date   Asthma    Back pain    Cataract    Cervical disc disorder    COPD (chronic obstructive pulmonary disease) (HCC)    Depression    Diabetes ([place])    type II   Diabetic neuropathy (HCC)    Dyspnea    with exertion   GERD (gastroesophageal reflux disease)    History of kidney stones    per patient "can't remember the year"   Hyperlipidemia    Hypertension    per patient "take meds for it"   Insomnia    Neuropathy    Osteoarthritis    Osteopenia    Pneumonia 2018   per patient   Reflux    Rheumatoid arthritis ([place])    Stroke ([place]) 2015   and again 2017  - Weakness in left leg, staggers w/ walking, vision    Tenosynovitis     Surgical History: Past Surgical History:  Procedure Laterality Date   BREAST BIOPSY Right    Fatty Tumor   ENDARTERECTOMY Right 10/21/2019   Procedure: ENDARTERECTOMY  CAROTID RIGHT;  Surgeon: [name], MD;  Location: [employer];  Service: Vascular;  Laterality: Right;   ENDARTERECTOMY Left 12/30/2019   Procedure: LEFT CAROTID ENDARTERECTOMY;  Surgeon: [name], MD;  Location: [place];  Service: Vascular;  Laterality: Left;   NECK SURGERY     OVARIAN CYST REMOVAL     PATCH ANGIOPLASTY Right 10/21/2019   Procedure: Patch Angioplasty Using XenoSure Biologic Patch Right Carotid;  Surgeon: [name], MD;  Location: [place];  Service: Vascular;  Laterality: Right;   PATCH ANGIOPLASTY Left 12/30/2019   Procedure: Patch Angioplasty Left Carotid;  Surgeon: [name], MD;  Location: [place];  Service: Vascular;  Laterality: Left;    Home Medications:  Allergies as of 02/09/2022       Reactions   Citalopram Nausea And Vomiting        Medication List        Accurate as of Feb 09, 2022  5:33 PM. If you have any questions, ask your nurse or doctor.          Accu-Chek Guide test strip Generic drug: glucose blood USE AS DIRECTED. UP TO 4 TIMES DAILY ONCE DAILY   Accu-Chek Softclix Lancets lancets USE AS DIRECTED. UP TO 4 TIMES A DAY   albuterol 108 (90 Base) MCG/ACT inhaler Commonly known as: VENTOLIN HFA INHALE 1-2 PUFFS BY  MOUTH EVERY 6 HOURS AS NEEDED WHEEZING/ SHORTNESS OF BREATH   aspirin EC 81 MG tablet Take 1 tablet (81 mg total) by mouth daily. Swallow whole.   atorvastatin 40 MG tablet Commonly known as: LIPITOR TAKE 1 TABLET BY MOUTH BEDTIME   blood glucose meter kit and supplies Kit Dispense based on patient and insurance preference. Use up to four times daily as directed.   buPROPion 150 MG 24 hr tablet Commonly known as: WELLBUTRIN XL TAKE 1 TABLET BY MOUTH ONCE DAILY   clopidogrel 75 MG tablet Commonly known as: PLAVIX TAKE 1 TABLET BY MOUTH ONCE DAILY   cyclobenzaprine 5 MG tablet Commonly known as: FLEXERIL Take 1 tablet (5 mg total) by mouth 3 (three) times daily as  needed for muscle spasms.   docusate sodium 100 MG capsule Commonly known as: Colace Take 1 capsule (100 mg total) by mouth 2 (two) times daily as needed for mild constipation.   metFORMIN 500 MG tablet Commonly known as: GLUCOPHAGE TAKE 1 TABLET BY MOUTH TWICE DAILY WITH A MEAL   nitrofurantoin (macrocrystal-monohydrate) 100 MG capsule Commonly known as: MACROBID Take 1 capsule (100 mg total) by mouth 2 (two) times daily.   ONE TOUCH DELICA LANCING DEV Misc 1 Device by Does not apply route 2 (two) times daily.   OneTouch Verio w/Device Kit USE AS DIRECTED   traZODone 50 MG tablet Commonly known as: DESYREL Take 0.5-1 tablets (25-50 mg total) by mouth at bedtime as needed for sleep.   Trelegy Ellipta 100-62.5-25 MCG/ACT Aepb Generic drug: Fluticasone-Umeclidin-Vilant USE ONE INHALATION INTO THE LUNGS ONCE A DAY RINSE MOUTH AFTER EACH USE   TYLENOL 8 HOUR ARTHRITIS PAIN PO Take 2 capsules by mouth in the morning, at noon, in the evening, and at bedtime.        Allergies:  Allergies  Allergen Reactions   Citalopram Nausea And Vomiting    Family History: Family History  Problem Relation Age of Onset   Stroke Father    Depression Father    Diabetes Mother    Hyperlipidemia Mother    Breast cancer Mother    Heart murmur Son    Heart murmur Son    Heart murmur Son     Social History:  reports that she has been smoking cigarettes. She has a 15.25 pack-year smoking history. She has quit using smokeless tobacco.  Her smokeless tobacco use included snuff. She reports that she does not drink alcohol and does not use drugs.   Physical Exam: BP 97/66   Pulse (!) 108   Ht $R'5\' 4"'wX$  (1.626 m)   Wt 113 lb (51.3 kg)   BMI 19.40 kg/m   Constitutional:  Alert, no acute distress. HEENT: [place] AT, moist mucus membranes.  Trachea midline, no masses. Cardiovascular: No clubbing, cyanosis, or edema. Respiratory: Normal respiratory effort, no increased work of breathing. GI:  Abdomen is soft, nontender, nondistended, no abdominal masses GU: No CVA tenderness Skin: No rashes, bruises or suspicious lesions. Neurologic: Grossly intact, no focal deficits, moving all 4 extremities. Psychiatric: Normal mood and affect.  Laboratory Data:  Lab Results  Component Value Date   CREATININE 0.84 02/02/2022     Pertinent Imaging: CT images were personally reviewed and interpreted.  CT urogram on my review showed no hydronephrosis, renal masses or ureteral abnormalities.  There was a filling defect left bladder wall suspicious for tumor  Assessment & Plan:    1. Gross hematuria Possible bladder mass/masses on CT Findings were discussed and recommend  office cystoscopy I recommended she resume her Plavix    Abbie Sons, MD  [facility] [place] [address] [place], [place] [address] [phone]

## 2022-02-15 ENCOUNTER — Ambulatory Visit: Payer: Medicaid Other | Admitting: Pharmacist

## 2022-02-15 DIAGNOSIS — E118 Type 2 diabetes mellitus with unspecified complications: Secondary | ICD-10-CM

## 2022-02-15 DIAGNOSIS — F17210 Nicotine dependence, cigarettes, uncomplicated: Secondary | ICD-10-CM

## 2022-02-15 DIAGNOSIS — J449 Chronic obstructive pulmonary disease, unspecified: Secondary | ICD-10-CM

## 2022-02-15 DIAGNOSIS — E785 Hyperlipidemia, unspecified: Secondary | ICD-10-CM

## 2022-02-15 DIAGNOSIS — F32A Depression, unspecified: Secondary | ICD-10-CM

## 2022-02-15 DIAGNOSIS — I10 Essential (primary) hypertension: Secondary | ICD-10-CM

## 2022-02-15 DIAGNOSIS — E1159 Type 2 diabetes mellitus with other circulatory complications: Secondary | ICD-10-CM

## 2022-02-15 DIAGNOSIS — Z7984 Long term (current) use of oral hypoglycemic drugs: Secondary | ICD-10-CM

## 2022-02-15 DIAGNOSIS — E538 Deficiency of other specified B group vitamins: Secondary | ICD-10-CM

## 2022-02-15 NOTE — Patient Instructions (Signed)
Visit Information  Thank you for taking time to visit with me today. Please don't hesitate to contact me if I can be of assistance to you before our next scheduled telephone appointment.  Following are the goals we discussed today:   Goals Addressed             This Visit's Progress    Pharmacy - Patient Goals       Please continue to keep log of home blood sugar results and have for [place] to review during our calls  Our goal bad cholesterol, or LDL, is less than 70 . This is why it is important to continue taking your atorvastatin.  I look forward to talking to you during our next telephone appointment. Please call if you need something sooner!   Wallace Cullens, PharmD, [place] [phone]          Our next appointment is by telephone on 03/24/2022 at 9:15 AM  Please call the care guide team at [phone] if you need to cancel or reschedule your appointment.    The patient verbalized understanding of instructions, educational materials, and care plan provided today and DECLINED offer to receive copy of patient instructions, educational materials, and care plan.

## 2022-02-15 NOTE — Chronic Care Management (AMB) (Signed)
Chronic Care Management CCM Pharmacy Note  02/15/2022 Name:  Kendra [name] MRN:  [id number] DOB:  [birth year]   Subjective: Kendra [name] is an 76 y.o. year old female who is a primary patient of [name], NP.  The CCM team was consulted for assistance with disease management and care coordination needs.    Engaged with patient by telephone for follow up visit for pharmacy case management and/or care coordination services.   Objective:  Medications Reviewed Today     Reviewed by [name], RPH-CPP (Pharmacist) on 02/15/22 at 1002  Med List Status: <None>   Medication Order Taking? Sig Documenting Provider Last Dose Status Informant  ACCU-CHEK GUIDE test strip [id number]  USE AS DIRECTED. UP TO 4 TIMES DAILY ONCE DAILY [name], NP  Active   Accu-Chek Softclix Lancets lancets [id number]  USE AS DIRECTED. UP TO 4 TIMES A DAY [name], Regina W, NP  Active   Acetaminophen (TYLENOL 8 HOUR ARTHRITIS PAIN PO) [id number]  Take 2 capsules by mouth in the morning, at noon, in the evening, and at bedtime. [provider]  Active Multiple Informants  albuterol (VENTOLIN HFA) 108 (90 Base) MCG/ACT inhaler [id number] Yes INHALE 1-2 PUFFS BY MOUTH EVERY 6 HOURS AS NEEDED WHEEZING/ SHORTNESS OF BREATH [name], NP Taking Active   aspirin EC 81 MG tablet [id number]  Take 1 tablet (81 mg total) by mouth daily. Swallow whole. [name], NP  Active   atorvastatin (LIPITOR) 40 MG tablet [id number]  TAKE 1 TABLET BY MOUTH BEDTIME [name], NP  Active   blood glucose meter kit and supplies KIT [id number]  Dispense based on patient and insurance preference. Use up to four times daily as directed. [name], NP  Active   Blood Glucose Monitoring Suppl [facility] VERIO) w/Device [name] [id number]  USE AS DIRECTED [name], FNP  Active Multiple Informants  buPROPion (WELLBUTRIN XL) 150 MG 24 hr tablet [id number]  TAKE 1 TABLET BY MOUTH ONCE DAILY [name], NP  Active   clopidogrel (PLAVIX) 75 MG tablet [id number] Yes TAKE 1 TABLET BY MOUTH ONCE DAILY [name], [name], NP Taking Active   cyclobenzaprine (FLEXERIL) 5 MG tablet [id number]  Take 1 tablet (5 mg total) by mouth 3 (three) times daily as needed for muscle spasms. [name], NP  Active   docusate sodium (COLACE) 100 MG capsule [id number]  Take 1 capsule (100 mg total) by mouth 2 (two) times daily as needed for mild constipation. [name], NP  Active   Lancet Devices (ONE TOUCH DELICA LANCING DEV) MISC [id number]  1 Device by Does not apply route 2 (two) times daily. [name], FNP  Active Multiple Informants  metFORMIN (GLUCOPHAGE) 500 MG tablet [id number] Yes TAKE 1 TABLET BY MOUTH TWICE DAILY WITH A MEAL [name], [name], NP Taking Active   traZODone (DESYREL) 50 MG tablet [id number]  Take 0.5-1 tablets (25-50 mg total) by mouth at bedtime as needed for sleep. [name], NP  Active   TRELEGY ELLIPTA 100-62.5-25 MCG/ACT AEPB [id number] Yes USE ONE INHALATION INTO THE LUNGS ONCE A DAY RINSE MOUTH AFTER EACH USE [name], NP Taking Active             Pertinent Labs:  Lab Results  Component Value Date   HGBA1C 7.7 (H) 08/01/2021   Lab Results  Component Value Date   CHOL 135 08/01/2021   HDL 65 08/01/2021  LDLCALC 53 08/01/2021   TRIG 89 08/01/2021   CHOLHDL 2.1 08/01/2021   Lab Results  Component Value Date   CREATININE 0.84 02/02/2022   BUN 13 02/02/2022   NA 140 02/02/2022   K 4.0 02/02/2022   CL 106 02/02/2022   CO2 25 02/02/2022    SDOH:  (Social Determinants of Health) assessments and interventions performed:    [place]  Review of patient past medical history, allergies, medications, health status, including review of consultants reports, laboratory and other test data, was performed as part of comprehensive evaluation and provision of chronic care management services.   Care Plan : PharmD - Medication Mgmt  Updates made  by Rennis Petty, RPH-CPP since 02/15/2022 12:00 AM     Problem: Disease Progression      Long-Range Goal: Disease Progression Prevented or Minimized   Start Date: 11/01/2020  Expected End Date: 01/30/2021  Recent Progress: On track  Priority: High  Note:   Current Barriers:  Financial Barriers Limited social support  Pharmacist Clinical Goal(s):  Over the next 90 days, patient will maintain adherence to monitoring guidelines and medication adherence to achieve therapeutic efficacy through collaboration with PharmD and provider.   Interventions: 1:1 collaboration with Webb Silversmith, NP regarding development and update of comprehensive plan of care as evidenced by provider attestation and co-signature Inter-disciplinary care team collaboration (see longitudinal plan of care) Perform chart review Patient seen for Office Visit with PCP on 5/16 related to gross hematuria. Provider advised patient:  Will send urine culture RX for Macrobid 100 mg BID x 5 days Will obtain CT abd/pelvis to r/o bladder/uterine neoplasm Per lab note from urine culture on 5/16, provider advised "Urine culture did not grow any bacteria.  She needs to stop the antibiotics.  It is important that she gets the CT scan done for further evaluation" Patient [facility] in [facility] on 5/18 related to hematuria Office Visit with [place] on 5/25 related to gross hematuria and bladder mass. Provider advised patient: Recommend office cystoscopy Scheduled for 03/13/2022 Recommended she resume her Plavix Today patient reports restarted Plavix as directed and denies further bleeding at this time  Coordination of Care: Reports has switched coverage back to [employer] (states latest coverage with [employer] active starting 01/16/2022), but is having difficulty with transportation for appointments Encourage patient to call number on the back of her [employer]  card for transportation Will place urgent referral to Care Guide team to request assistance to patient with transportation for upcoming appointments  Medication Management/adherence: Reports continues to take medication from pill pack as packaged by [employer] patient on importance of using maintenance inhaler (Trelegy) daily and rescue inhaler (albuterol) as needed as directed and to rinse out mouth after each use of Trelegy  T2DM: Current treatment: metformin 500 mg twice daily Reports last checked blood sugar yesterday morning before breakfast: 115 Reports difficulty with using new Accu-chek Guide Me meter.  Patient to review "how to use" video for meter with her son [name] Advise patient if has further questions or difficulty with meter in the future could bring device up to her local pharmacy or to call CCM Pharmacist Denies symptoms of hypoglycemia Encourage patient to have regular well-balanced meals throughout the day, limiting carbohydrate portion sizes    Patient Goals/Self-Care Activities Over the next 90 days, patient will:  - take medications as prescribed  Using pill packaging from [facility] - check glucose, document, and  provide at future appointments - check blood pressure, document, and provide at future appointments  Follow Up Plan: The care management team will reach out to the patient again over the next 30 days.         Wallace Cullens, PharmD, [place] [phone]

## 2022-02-16 ENCOUNTER — Telehealth: Payer: Self-pay

## 2022-02-16 NOTE — Telephone Encounter (Signed)
   Telephone encounter was:  Successful.  02/16/2022 Name: Kendra [name] MRN: [id number] DOB: [birth year]  [name] is a 76 y.o. year old female who is a primary care patient of [name], NP . The community resource team was consulted for assistance with Transportation Needs   Care guide performed the following interventions: Patient provided with information about care guide support team and interviewed to confirm resource needs. Patient is unsure what is going on with insurance transportation. CG will call [employer] to inquire. CG will also check with county resources as pt needs ride on 6/5 and 6/26.  Follow Up Plan:  Care guide will outreach resources to assist patient with rides to appts.  [place] management  [place], [place]  Main Phone: [phone]  E-mail: [email or web address]  Website: [email or web address]

## 2022-02-16 NOTE — Telephone Encounter (Signed)
   Telephone encounter was:  Unsuccessful.  02/16/2022 Name: Kendra [name] MRN: [id number] DOB: 24-May-1946  Unsuccessful outbound call made today to assist with:  Transportation Needs   Outreach Attempt:  1st Attempt Follow up  A HIPAA compliant voice message was left requesting a return call.  Instructed patient to call back at [phone] at their earliest convenience.  CG was unable to schedule ride with insurance due to late notice, however [employer] will be picking pt up on Monday 6/5 at 2pm. CG will advise pt of insurance information listed below in order for pt to schedule her own rides in the future. Pt has 71 one-way/21 round trip rides left with [facility] she can call the customer service number on the back of her card or dial [phone] to book future rides. CG will also send out information on [employer] operational [employer] and [employer]	[place].   Follow up: CG will check back with patient again to relay information on referral status.  [place] management  [place], [place]  Main Phone: [phone]  E-mail: [email or web address]  Website: [email or web address]

## 2022-02-17 ENCOUNTER — Telehealth: Payer: Self-pay

## 2022-02-17 NOTE — Telephone Encounter (Signed)
   Telephone encounter was:  Successful.  02/17/2022 Name: Kendra [name] MRN: [id number] DOB: 10-22-1945  [name] is a 76 y.o. year old female who is a primary care patient of [name], NP . The community resource team was consulted for assistance with Transportation Needs   Care guide performed the following interventions: Follow up call placed to the patient to discuss status of referral. Pt has been educated on [employer] and how to book. Pt has also been advised county transportation information has been mailed out to her as well.  Follow Up Plan:  Care guide will follow up with patient by phone over the next few days to ensure mail has been received.  [place] management  [place], [place]  Main Phone: [phone]  E-mail: [email or web address]  Website: [email or web address]

## 2022-02-20 ENCOUNTER — Inpatient Hospital Stay: Payer: 59

## 2022-02-20 ENCOUNTER — Inpatient Hospital Stay: Payer: 59 | Attending: Oncology | Admitting: Oncology

## 2022-02-20 ENCOUNTER — Encounter: Payer: Self-pay | Admitting: Oncology

## 2022-02-20 VITALS — BP 130/76 | HR 94 | Temp 96.1°F | Ht 64.0 in | Wt 113.0 lb

## 2022-02-20 DIAGNOSIS — R809 Proteinuria, unspecified: Secondary | ICD-10-CM | POA: Insufficient documentation

## 2022-02-20 DIAGNOSIS — Z87891 Personal history of nicotine dependence: Secondary | ICD-10-CM

## 2022-02-20 DIAGNOSIS — D472 Monoclonal gammopathy: Secondary | ICD-10-CM | POA: Diagnosis present

## 2022-02-20 DIAGNOSIS — Z79899 Other long term (current) drug therapy: Secondary | ICD-10-CM | POA: Diagnosis not present

## 2022-02-20 DIAGNOSIS — R803 Bence Jones proteinuria: Secondary | ICD-10-CM

## 2022-02-20 DIAGNOSIS — E538 Deficiency of other specified B group vitamins: Secondary | ICD-10-CM | POA: Diagnosis not present

## 2022-02-20 NOTE — Progress Notes (Signed)
Hematology/Oncology Progress note [phone] [phone]      Patient Care Team: [name], NP as PCP - General (Internal Medicine) Steele Sizer, MD as Attending Physician (Family Medicine) Christene Lye, MD (General Surgery) Curley Spice Virl Diamond, RPH-CPP as Pharmacist Hall Busing Nobie Putnam, RN as Case Manager ([place]) [place], [name], FNP as Nurse Practitioner (Family Medicine) [name], LCSW as Social Worker (Licensed Clinical Social Worker)  REFERRING PROVIDER: [name], NP  CHIEF COMPLAINTS/REASON FOR VISIT:  Follow up for anemia, bence jones protein   HISTORY OF PRESENTING ILLNESS:  Kendra [name] is a  76 y.o.  female with PMH listed below who was referred to me for evaluation of anemia Reviewed patient's recent labs that was done.  06/14/20 Labs revealed anemia with hemoglobin of 11.2  Reviewed patient's previous labs ordered by primary care physician's office, anemia is chronic onset , duration is since Jan 2021 No aggravating or improving factors.  Associated signs and symptoms: Patient reports fatigue. deneis  SOB with exertion.  Denies  easy bruising, hematochezia, hemoptysis, hematuria. She has had lost weight over the past 2 years. Context: History of GI bleeding: denies               History of Chronic kidney disease: deneis               History of autoimmune disease: Rheumatoid arthritis               Last colonoscopy: 2007                Extensive smoking history   INTERVAL HISTORY Kendra [name] is a 76 y.o. female who has above history reviewed by me today presents for follow up visit for management of anemia and MGUS. Patient reports no new complaints today.  She continues to smoke.  Review of Systems  Constitutional:  Positive for fatigue. Negative for appetite change, chills and fever.  HENT:   Negative for hearing loss and voice change.   Eyes:  Negative for eye problems.  Respiratory:   Negative for chest tightness, cough and wheezing.   Cardiovascular:  Negative for chest pain.  Gastrointestinal:  Negative for abdominal distention, abdominal pain and blood in stool.  Endocrine: Negative for hot flashes.  Genitourinary:  Negative for difficulty urinating and frequency.   Musculoskeletal:  Positive for arthralgias.  Skin:  Negative for itching and rash.  Neurological:  Negative for extremity weakness.  Hematological:  Negative for adenopathy.  Psychiatric/Behavioral:  Negative for confusion.     MEDICAL HISTORY:  Past Medical History:  Diagnosis Date   Asthma    Back pain    Cataract    Cervical disc disorder    COPD (chronic obstructive pulmonary disease) (HCC)    Depression    Diabetes (HCC)    type II   Diabetic neuropathy (HCC)    Dyspnea    with exertion   GERD (gastroesophageal reflux disease)    History of kidney stones    per patient "can't remember the year"   Hyperlipidemia    Hypertension    per patient "take meds for it"   Insomnia    Neuropathy    Osteoarthritis    Osteopenia    Pneumonia 2018   per patient   Reflux    Rheumatoid arthritis ([place])    Stroke ([place]) 2015   and again 2017  - Weakness in left leg, staggers w/ walking, vision  Tenosynovitis     SURGICAL HISTORY: Past Surgical History:  Procedure Laterality Date   BREAST BIOPSY Right    Fatty Tumor   ENDARTERECTOMY Right 10/21/2019   Procedure: ENDARTERECTOMY CAROTID RIGHT;  Surgeon: [name], MD;  Location: [facility];  Service: Vascular;  Laterality: Right;   ENDARTERECTOMY Left 12/30/2019   Procedure: LEFT CAROTID ENDARTERECTOMY;  Surgeon: [name], MD;  Location: [facility];  Service: Vascular;  Laterality: Left;   NECK SURGERY     OVARIAN CYST REMOVAL     PATCH ANGIOPLASTY Right 10/21/2019   Procedure: Patch Angioplasty Using XenoSure Biologic Patch Right Carotid;  Surgeon: [name], MD;  Location: [facility];  Service: Vascular;   Laterality: Right;   PATCH ANGIOPLASTY Left 12/30/2019   Procedure: Patch Angioplasty Left Carotid;  Surgeon: [name], MD;  Location: [facility];  Service: Vascular;  Laterality: Left;    SOCIAL HISTORY: Social History   Socioeconomic History   Marital status: Widowed    Spouse name: Not on file   Number of children: 3   Years of education: 75   Highest education level: Not on file  Occupational History   Occupation: Disabled  Tobacco Use   Smoking status: Some Days    Packs/day: 0.25    Years: 61.00    Pack years: 15.25    Types: Cigarettes   Smokeless tobacco: Former    Types: Snuff  Vaping Use   Vaping Use: Never used  Substance and Sexual Activity   Alcohol use: No   Drug use: No   Sexual activity: Never  Other Topics Concern   Not on file  Social History Narrative   Not on file   Social Determinants of Health   Financial Resource Strain: Medium Risk   Difficulty of Paying Living Expenses: Somewhat hard  Food Insecurity: No Food Insecurity   Worried About Charity fundraiser in the Last Year: Never true   Ran Out of Food in the Last Year: Never true  Transportation Needs: No Transportation Needs   Lack of Transportation (Medical): No   Lack of Transportation (Non-Medical): No  Physical Activity: Inactive   Days of Exercise per Week: 0 days   Minutes of Exercise per Session: 0 min  Stress: Stress Concern Present   Feeling of Stress : Rather much  Social Connections: Socially Isolated   Frequency of Communication with Friends and Family: More than three times a week   Frequency of Social Gatherings with Friends and Family: More than three times a week   Attends Religious Services: Never   Marine scientist or Organizations: No   Attends Archivist Meetings: Never   Marital Status: Widowed  Human resources officer Violence: Not At Risk   Fear of Current or Ex-Partner: No   Emotionally Abused: No   Physically Abused: No   Sexually  Abused: No    FAMILY HISTORY: Family History  Problem Relation Age of Onset   Stroke Father    Depression Father    Diabetes Mother    Hyperlipidemia Mother    Breast cancer Mother    Heart murmur Son    Heart murmur Son    Heart murmur Son     ALLERGIES:  is allergic to citalopram.  MEDICATIONS:  Current Outpatient Medications  Medication Sig Dispense Refill   ACCU-CHEK GUIDE test strip USE AS DIRECTED. UP TO 4 TIMES DAILY ONCE DAILY 400 each 1   Accu-Chek Softclix Lancets lancets USE AS  DIRECTED. UP TO 4 TIMES A DAY 400 each 1   Acetaminophen (TYLENOL 8 HOUR ARTHRITIS PAIN PO) Take 2 capsules by mouth in the morning, at noon, in the evening, and at bedtime.     albuterol (VENTOLIN HFA) 108 (90 Base) MCG/ACT inhaler INHALE 1-2 PUFFS BY MOUTH EVERY 6 HOURS AS NEEDED WHEEZING/ SHORTNESS OF BREATH 8.5 g 1   aspirin EC 81 MG tablet Take 1 tablet (81 mg total) by mouth daily. Swallow whole. 90 tablet 1   atorvastatin (LIPITOR) 40 MG tablet TAKE 1 TABLET BY MOUTH BEDTIME 90 tablet 0   blood glucose meter kit and supplies KIT Dispense based on patient and insurance preference. Use up to four times daily as directed. 1 each 0   Blood Glucose Monitoring Suppl (ONETOUCH VERIO) w/Device KIT USE AS DIRECTED 1 kit 0   buPROPion (WELLBUTRIN XL) 150 MG 24 hr tablet TAKE 1 TABLET BY MOUTH ONCE DAILY 90 tablet 0   clopidogrel (PLAVIX) 75 MG tablet TAKE 1 TABLET BY MOUTH ONCE DAILY 90 tablet 0   cyclobenzaprine (FLEXERIL) 5 MG tablet Take 1 tablet (5 mg total) by mouth 3 (three) times daily as needed for muscle spasms. 30 tablet 2   docusate sodium (COLACE) 100 MG capsule Take 1 capsule (100 mg total) by mouth 2 (two) times daily as needed for mild constipation. 60 capsule 2   Lancet Devices (ONE TOUCH DELICA LANCING DEV) MISC 1 Device by Does not apply route 2 (two) times daily. 200 each 4   metFORMIN (GLUCOPHAGE) 500 MG tablet TAKE 1 TABLET BY MOUTH TWICE DAILY WITH A MEAL 180 tablet 0    traZODone (DESYREL) 50 MG tablet Take 0.5-1 tablets (25-50 mg total) by mouth at bedtime as needed for sleep. 90 tablet 0   TRELEGY ELLIPTA 100-62.5-25 MCG/ACT AEPB USE ONE INHALATION INTO THE LUNGS ONCE A DAY RINSE MOUTH AFTER EACH USE 60 each 1   No current facility-administered medications for this visit.     PHYSICAL EXAMINATION: ECOG PERFORMANCE STATUS: 1 - Symptomatic but completely ambulatory Vitals:   02/20/22 1438  BP: 130/76  Pulse: 94  Temp: (!) 96.1 F (35.6 C)   Filed Weights   02/20/22 1438  Weight: 113 lb (51.3 kg)    Physical Exam Constitutional:      General: She is not in acute distress.    Comments: Thin built  HENT:     Head: Normocephalic and atraumatic.  Eyes:     General: No scleral icterus. Cardiovascular:     Rate and Rhythm: Normal rate and regular rhythm.     Heart sounds: Normal heart sounds.  Pulmonary:     Effort: Pulmonary effort is normal. No respiratory distress.     Breath sounds: No wheezing.  Abdominal:     General: Bowel sounds are normal. There is no distension.     Palpations: Abdomen is soft.  Musculoskeletal:        General: No deformity. Normal range of motion.     Cervical back: Normal range of motion and neck supple.  Skin:    General: Skin is warm and dry.     Findings: No erythema or rash.  Neurological:     Mental Status: She is alert. Mental status is at baseline.     Cranial Nerves: No cranial nerve deficit.     Coordination: Coordination normal.  Psychiatric:        Mood and Affect: Mood normal.     LABORATORY DATA:  I  have reviewed the data as listed Lab Results  Component Value Date   WBC 4.9 02/02/2022   HGB 10.6 (L) 02/02/2022   HCT 33.2 (L) 02/02/2022   MCV 97.1 02/02/2022   PLT 267 02/02/2022   Recent Labs    06/06/21 1314 08/01/21 1021 10/06/21 1243 01/24/22 0904 02/02/22 1340  NA 139 139 136 137 140  K 4.1 4.5 3.8 3.6 4.0  CL 103 103 101 102 106  CO2 _0 GLUCOSE 126* 135*  139* 129* 102*  BUN 24* _1 CREATININE 0.80 0.85 0.82 0.83 0.84  CALCIUM 9.6 9.8 9.8 9.7 9.9  GFRNONAA >60  --  >60 >60 >60  PROT 7.6 6.9  --  7.8 7.0  ALBUMIN 4.2  --   --  4.6 4.2  AST 17 14  --  30 21  ALT 19 17  --  27 16  ALKPHOS 83  --   --  62 51  BILITOT 0.7 0.3  --  0.4 0.6    Iron/TIBC/Ferritin/ %Sat    Component Value Date/Time   IRON 65 07/20/2020 1018   TIBC 395 07/20/2020 1018   FERRITIN 33 07/20/2020 1018   IRONPCTSAT 17 07/20/2020 1018        ASSESSMENT & PLAN:  1. B12 deficiency   2. Bence Jones protein present in urine   3. 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 Labs reviewed and discussed with patient Free light chain ratio is stable.  M protein negative. Recommend patient to submit 24-hour urine protein electrophoresis.  Continue observation.  Vitamin B12 deficiency, vitamin B12 level has improved.  Repeat B12 level at the next visit.  # extensive smoking history: smoking cessation was discussed.  I have previously referred her to lung cancer screening program.  Orders Placed This Encounter  Procedures   IFE+PROTEIN ELECTRO, 24-HR UR    Standing Status:   Future    Standing Expiration Date:   02/21/2023   CBC with Differential/Platelet    Standing Status:   Future    Standing Expiration Date:   02/21/2023   Comprehensive metabolic panel    Standing Status:   Future    Standing Expiration Date:   02/20/2023   Kappa/lambda light chains    Standing Status:   Future    Standing Expiration Date:   02/20/2023   Multiple Myeloma Panel (SPEP&IFE w/QIG)    Standing Status:   Future    Standing Expiration Date:   02/20/2023   IFE+PROTEIN ELECTRO, 24-HR UR    Standing Status:   Future    Standing Expiration Date:   02/21/2023    All questions were answered. The patient knows to call the clinic with any problems questions or  concerns. Cc. [name], NP  Return of visit: 6 months  Earlie Server, MD, PhD 02/20/2022

## 2022-02-21 ENCOUNTER — Ambulatory Visit: Payer: Self-pay

## 2022-02-21 NOTE — Telephone Encounter (Signed)
FYI

## 2022-02-21 NOTE — Telephone Encounter (Signed)
  Chief Complaint: swollen painful hand/thumb, Hip also hurts Symptoms: ibid Frequency: Since May 8th Pertinent Negatives: Patient denies fever Disposition: '[x]'$ ED /'[]'$ Urgent Care (no appt availability in office) / '[]'$ Appointment(In office/virtual)/ '[]'$  [facility]/ '[]'$ Home Care/ '[]'$ Refused Recommended Disposition /'[]'$ [facility]/ '[]'$  Follow-up with PCP Additional Notes: Pt feel on May 8th and caught herself with her right hand, also fell on her hip. Since then hand is very swollen and painful - a burning sensation. Pt cannot use hand, and thumb is numb. Hip is also quite painful.  Pt will go to ED when her ride is off of work.  Reason for Disposition  Looks like a broken bone (e.g., knuckle is gone or depressed)  Answer Assessment - Initial Assessment Questions 1. MECHANISM: "How did the injury happen?"     May 8th - getting up to bathroom 2. ONSET: "When did the injury happen?" (Minutes or hours ago)      May 8th 3. APPEARANCE of INJURY: "What does the injury look like?"      Swollen, can't move 4. SEVERITY: "Can you use the hand normally?" "Can you bend your fingers into a ball and then fully open them?"     no 5. SIZE: For cuts, bruises, or swelling, ask: "How large is it?" (e.g., inches or centimeters;  entire hand or wrist)      no 6. PAIN: "Is there pain?" If Yes, ask: "How bad is the pain?"  (Scale 1-10; or mild, moderate, severe)     severe 7. TETANUS: For any breaks in the skin, ask: "When was the last tetanus booster?"     no 8. OTHER SYMPTOMS: "Do you have any other symptoms?"      Hip pain 9. PREGNANCY: "Is there any chance you are pregnant?" "When was your last menstrual period?"     na  Protocols used: Hand and Wrist Injury-A-AH

## 2022-02-22 NOTE — Telephone Encounter (Signed)
Noted. Will review ED note

## 2022-02-27 ENCOUNTER — Telehealth: Payer: Medicaid Other

## 2022-02-27 ENCOUNTER — Telehealth: Payer: Self-pay

## 2022-02-27 NOTE — Telephone Encounter (Signed)
  Care Management   Follow Up Note   02/27/2022 Name: Kendra [name] MRN: [id number] DOB: September 20, 1945   Referred by: [name], NP Reason for referral : Chronic Care Management (RNCM: Follow up for Chronic Disease Management and Care Coordination Needs )   An unsuccessful telephone outreach was attempted today. The patient was referred to the case management team for assistance with care management and care coordination.   Follow Up Plan: The care management team will reach out to the patient again over the next 30 days.   [name] RN, MSN, [place] Mobile: [phone]

## 2022-03-02 ENCOUNTER — Telehealth: Payer: Self-pay

## 2022-03-02 NOTE — Telephone Encounter (Signed)
   Telephone encounter was:  Unsuccessful.  03/02/2022 Name: Kendra [name] MRN: [id number] DOB: 1945-10-29  Unsuccessful outbound call made today to assist with:  Transportation Needs   Outreach Attempt:  1st Attempt f/u  A HIPAA compliant voice message was left requesting a return call.  Instructed patient to call back at [phone] at their earliest convenience.  [place] management  [place], [place]  Main Phone: [phone]  E-mail: [email or web address]  Website: [email or web address]

## 2022-03-03 ENCOUNTER — Telehealth: Payer: Self-pay

## 2022-03-03 NOTE — Telephone Encounter (Signed)
   Telephone encounter was:  Successful.  03/03/2022 Name: Kendra [name] MRN: [id number] DOB: Feb 05, 1946  Kendra [name] is a 76 y.o. year old female who is a primary care patient of [name], NP . The community resource team was consulted for assistance with Transportation Needs   Care guide performed the following interventions: Follow up call placed to the patient to discuss status of referral. Patient advised she received the transportation resources and at this time she does not have any further questions or concerns. Patient understands how to book with [name] and knows resources sent is for county transportation.  Follow Up Plan:  No further follow up planned at this time. The patient has been provided with needed resources.  [place] management  [place], [place]  Main Phone: [phone]  E-mail: [email or web address]  Website: [email or web address]

## 2022-03-06 ENCOUNTER — Ambulatory Visit (INDEPENDENT_AMBULATORY_CARE_PROVIDER_SITE_OTHER): Payer: 59

## 2022-03-06 ENCOUNTER — Telehealth: Payer: 59

## 2022-03-06 DIAGNOSIS — Z8709 Personal history of other diseases of the respiratory system: Secondary | ICD-10-CM

## 2022-03-06 DIAGNOSIS — R296 Repeated falls: Secondary | ICD-10-CM

## 2022-03-06 DIAGNOSIS — E1159 Type 2 diabetes mellitus with other circulatory complications: Secondary | ICD-10-CM

## 2022-03-06 DIAGNOSIS — E785 Hyperlipidemia, unspecified: Secondary | ICD-10-CM

## 2022-03-06 DIAGNOSIS — E118 Type 2 diabetes mellitus with unspecified complications: Secondary | ICD-10-CM

## 2022-03-06 DIAGNOSIS — G8929 Other chronic pain: Secondary | ICD-10-CM

## 2022-03-06 DIAGNOSIS — M159 Polyosteoarthritis, unspecified: Secondary | ICD-10-CM

## 2022-03-06 DIAGNOSIS — R319 Hematuria, unspecified: Secondary | ICD-10-CM

## 2022-03-06 NOTE — Chronic Care Management (AMB) (Signed)
Chronic Care Management   CCM RN Visit Note  03/06/2022 Name: [name] MRN: [id number] DOB: 10-07-1945  Subjective: Kendra [name] is a 76 y.o. year old female who is a primary care patient of Kendra Fenton, NP. The care management team was consulted for assistance with disease management and care coordination needs.    Engaged with patient by telephone for follow up visit in response to provider referral for case management and/or care coordination services.   Consent to Services:  The patient was given information about Chronic Care Management services, agreed to services, and gave verbal consent prior to initiation of services.  Please see initial visit note for detailed documentation.   Patient agreed to services and verbal consent obtained.   Assessment: Review of patient past medical history, allergies, medications, health status, including review of consultants reports, laboratory and other test data, was performed as part of comprehensive evaluation and provision of chronic care management services.   SDOH (Social Determinants of Health) assessments and interventions performed:    CCM Care Plan  Allergies  Allergen Reactions   Citalopram Nausea And Vomiting    Outpatient Encounter Medications as of 03/06/2022  Medication Sig   ACCU-CHEK GUIDE test strip USE AS DIRECTED. UP TO 4 TIMES DAILY ONCE DAILY   Accu-Chek Softclix Lancets lancets USE AS DIRECTED. UP TO 4 TIMES A DAY   Acetaminophen (TYLENOL 8 HOUR ARTHRITIS PAIN PO) Take 2 capsules by mouth in the morning, at noon, in the evening, and at bedtime.   albuterol (VENTOLIN HFA) 108 (90 Base) MCG/ACT inhaler INHALE 1-2 PUFFS BY MOUTH EVERY 6 HOURS AS NEEDED WHEEZING/ SHORTNESS OF BREATH   aspirin EC 81 MG tablet Take 1 tablet (81 mg total) by mouth daily. Swallow whole.   atorvastatin (LIPITOR) 40 MG tablet TAKE 1 TABLET BY MOUTH BEDTIME   blood glucose meter kit and supplies KIT Dispense based on patient and  insurance preference. Use up to four times daily as directed.   Blood Glucose Monitoring Suppl (ONETOUCH VERIO) w/Device KIT USE AS DIRECTED   buPROPion (WELLBUTRIN XL) 150 MG 24 hr tablet TAKE 1 TABLET BY MOUTH ONCE DAILY   clopidogrel (PLAVIX) 75 MG tablet TAKE 1 TABLET BY MOUTH ONCE DAILY   cyclobenzaprine (FLEXERIL) 5 MG tablet Take 1 tablet (5 mg total) by mouth 3 (three) times daily as needed for muscle spasms.   docusate sodium (COLACE) 100 MG capsule Take 1 capsule (100 mg total) by mouth 2 (two) times daily as needed for mild constipation.   Lancet Devices (ONE TOUCH DELICA LANCING DEV) MISC 1 Device by Does not apply route 2 (two) times daily.   metFORMIN (GLUCOPHAGE) 500 MG tablet TAKE 1 TABLET BY MOUTH TWICE DAILY WITH A MEAL   traZODone (DESYREL) 50 MG tablet Take 0.5-1 tablets (25-50 mg total) by mouth at bedtime as needed for sleep.   TRELEGY ELLIPTA 100-62.5-25 MCG/ACT AEPB USE ONE INHALATION INTO THE LUNGS ONCE A DAY RINSE MOUTH AFTER EACH USE   No facility-administered encounter medications on file as of 03/06/2022.    Patient Active Problem List   Diagnosis Date Noted   Aortic atherosclerosis (Kendra [name]) 05/31/2021   COPD (chronic obstructive pulmonary disease) (Kendra [name]) 05/31/2021   Anemia 05/31/2021   Hemiparesis affecting left side as late effect of stroke (Kendra [name]) 02/02/2021   Recurrent major depressive disorder, in partial remission (Kendra [name]) 02/02/2021   Carotid artery stenosis 12/30/2019   Hypertension associated with diabetes (Kendra [name]) 12/19/2018   Diabetes mellitus type 2,  controlled, with complications (Kendra [name]) 81/09/7508   Insomnia 03/30/2015   Hyperlipidemia associated with type 2 diabetes mellitus (Kendra [name]) 03/30/2015   Gastro-esophageal reflux disease without esophagitis 03/30/2015   Osteoarthritis 03/30/2015   Osteopenia 03/30/2015   B12 deficiency 03/30/2015    Conditions to be addressed/monitored:HTN, HLD, COPD, DMII, Depression, Osteoarthritis, and Chronic pain, falls, blood  in urine  Care Plan : RNCM: General Plan of Care (Adult) for Chronic Disease Management and Care Coordination Needs  Updates made by Vanita Ingles, RN since 03/06/2022 12:00 AM     Problem: RNCM: Development of Plan of Care For Chronic Disease Management (HTN, HLD, DM, OA, COPD, Depression)   Priority: High     Long-Range Goal: RNCM: Effective Management  of Plan of Care For Chronic Disease Management (HTN, HLD, DM, OA, COPD, Depression)   Start Date: 08/08/2021  Expected End Date: 08/08/2022  Priority: High  Note:   Current Barriers:  Knowledge Deficits related to plan of care for management of HTN, HLD, COPD, DMII, Depression: depressed mood anxiety, and chronic pain with OA to right shoulder  Care Coordination needs related to Limited social support, Mental Health Concerns , and Literacy concerns  Chronic Disease Management support and education needs related to HTN, HLD, COPD, DMII, Depression: depressed mood anxiety, and Chronic Pain/OA Lacks caregiver support.         RNCM Clinical Goal(s):  Patient will verbalize basic understanding of HTN, HLD, COPD, DMII, Depression, and Osteoarthritis disease process and self health management plan as evidenced by following plan of care, keeping appointments, calling the provider office for changes in conditions and working with the CCM team to optimize health and well being  take all medications exactly as prescribed and will call provider for medication related questions as evidenced by compliance with medications and calling the office for refill needs     attend all scheduled medical appointments: pcp appointments and specialist appointments as evidenced by keeping appointments and calling the office for needed schedule changes        continue to work with RN Care Manager and/or Social Worker to address care management and care coordination needs related to HTN, HLD, COPD, DMII, Depression, and Osteoarthritis as evidenced by adherence to CM  Team Scheduled appointments     demonstrate a decrease in HTN, HLD, COPD, DMII, Depression, and Osteoarthritis exacerbations  as evidenced by following the plan of care and calling for any changes or acute exacerbations in chronic conditions  demonstrate ongoing self health care management ability for effective management of Chronic conditions  as evidenced by  working with the CCM team through collaboration with Consulting civil engineer, provider, and care team.   Interventions: 1:1 collaboration with primary care provider regarding development and update of comprehensive plan of care as evidenced by provider attestation and co-signature Inter-disciplinary care team collaboration (see longitudinal plan of care) Evaluation of current treatment plan related to  self management and patient's adherence to plan as established by provider   COPD: (Status: Goal on Track (progressing): YES.) Long Term Goal  Reviewed medications with patient, including use of prescribed maintenance and rescue inhalers, and provided instruction on medication management and the importance of adherence. 03-06-2022: The patient is compliant with medications. Denies any medications needs today Provided patient with basic written and verbal COPD education on self care/management/and exacerbation prevention. 10-10-2021: The patient was in the ER for evaluation of chest pain on 10-06-2021. The patient states that they told her she had "bronchitis" but did not give her  any medications. The patient states she has a runny nose and the drainage is clear. Denies fever or chills, education and appointment secured for the patient to talk to pcp on 10-11-2021 via phone at 4 pm. Review and confirmed with the patient. 11-14-2021: The patient states she is doing better and denies any issues related to COPD or changes in her breathing. She has stopped smoking and continues to refrain from smoking. Praised the patient for positive changes made for her health and  well being. The patient says it is hard not to smoke but she is dedicated to refrain from smoking. 03-06-2022: The patient denies any exacerbations with her COPD. States her condition is stable. No new concerns with her breathing at this time. Knows to call for changes. Advised patient to track and manage COPD triggers. 11-14-2021: Review with the patient and education provider on factors that can trigger COPD exacerbation. The patient states the cold and raining weather causes issues and she knows to monitor closely for changes in her condition. 03-06-2022: The patient states her COPD is stable at this time. Knows factors that trigger an exacerbation. Provided written and verbal instructions on pursed lip breathing and utilized returned demonstration as teach back. 03-06-2022: The patient works with the [facility] on a regular basis. Knows to call for changes or new needs.  Provided instruction about proper use of medications used for management of COPD including inhalers Advised patient to self assesses COPD action plan zone and make appointment with provider if in the yellow zone for 48 hours without improvement Advised patient to engage in light exercise as tolerated 3-5 days a week to aid in the the management of COPD. 03-06-2022: Review with the patient to pace activities and be safe. Denies any acute findings today. States she is doing okay. Denies any new falls since May Provided education about and advised patient to utilize infection prevention strategies to reduce risk of respiratory infection. 03-06-2022: The patient denies any issues with her COPD. Knows the factors that contribute to increase risk of exacerbation. Will continue to monitor for changes or new needs.  Discussed the importance of adequate rest and management of fatigue with COPD. 03-06-2022: Review and the patient is staying safe and denies any acute findings. Denies any issues with CP or respiratory conditions.  Screening for signs and  symptoms of depression related to chronic disease state  Assessed social determinant of health barriers  Diabetes:  (Status: Goal on Track (progressing): YES.) Long Term Goal   Lab Results  Component Value Date   HGBA1C 7.7 (H) 08/01/2021  Assessed patient's understanding of A1c goal: <7%. 08-08-2021: States her A1C was elevated this time. Review of steroid shots may have caused some of the reason for elevation. The patient states she did not have her paper with her and is having to write down her numbers. Will mail the patient more sheets to record blood pressures and blood sugars. 03-06-2022: Denies any acute findings related to DM. Gave the RNCM several readings related to DM management. Reminder to get regular A1c levels drawn. See updated list below.  Provided education to patient about basic DM disease process. 03-06-2022: Review of DM and the patient states she feels fine related to her DM health and well being. ; Reviewed medications with patient and discussed importance of medication adherence. 08-08-2021: The patient has started back on Metformin 500 mg BID, endorses compliance. Denies any issues.  03-06-2022: The patient is compliant with medications, denies any acute findings. The  patient works with the pharm D on a regular basis for effective management of medications for DM and other chronic conditions.     Reviewed prescribed diet with patient heart healthy/ADA- 03-06-2022: The patient is compliant with heart healthy/ADA diet ; Counseled on importance of regular laboratory monitoring as prescribed. 03-06-2022: The patient is compliant with getting regular lab work for evaluation;        Discussed plans with patient for ongoing care management follow up and provided patient with direct contact information for care management team.  Provided patient with written educational materials related to hypo and hyperglycemia and importance of correct treatment. 10-10-2021: Denies any lows but states she  knows how to monitor and treat . 11-14-2021: States compliance with checking blood sugars. Denies any lows and states the lowest she has seen is 92. Highest is 243 and that was an isolated incident. She is mindful of her blood sugar readings and does not want to go back on the "needle". Education and support given.   12-26-2021: The patient states her blood sugars have been good. Did not provide readings today. Last one on 12-13-2021 was 134 fasting. The patient was a little stressed today over transportation needs. Education and support provided.  03-06-2022: The patient denies any real lows or real highs. Lowest the patient has been is 98 and the highest is 173. Reviewed scheduled/upcoming provider appointments including: States once she sees oncology she will call for a follow up appointment with the pcp Advised patient, providing education and rationale, to check cbg before meals and at bedtime, when you have symptoms of low or high blood sugar, and before and after exercise and record. 10-10-2021: The patient takes blood sugars and records. Denies any acute findings.   03-06-2022: Takes readings on a consistent basis. See chart below for most recent readings:    BS readings     02/24/2022 120 01/27/2022 111  02/25/2022 119    02/26/2022 98    02/28/2022 173    03/01/2022 128    03/02/2022 128    03/03/2022 128    call provider for findings outside established parameters;       Review of patient status, including review of consultants reports, relevant laboratory and other test results, and medications completed;       Review of last eye exam. 11-14-2021: The patient states it has been about a year and a half since she has had an eye exam. Ask the patient to get an appointment for follow up with her eye doctor and the importance of regular eye exams.   Depression  (Status: Goal on Track (progressing): YES.) Long Term Goal  Evaluation of current treatment plan related to Depression, Mental Health Concerns   self-management and patient's adherence to plan as established by provider. 10-10-2021: The patient denies any acute distress. She is not feeling the best right now due to her recent visit to the ER and diagnosis of bronchitis. She also has appointments coming up to have her rotator cuff evaluated. The patient states that she is just wanting to feel better. Denies any mental health needs at this time. 11-14-2021: The patient states that she is doing well and denies any issues with depression or anxiety at this time. Did have a fall a week ago Saturday. States that she is okay and not injured. Discussed safety and falls prevention. Also encouraged the patient to continue to work with CCM team for needs and concerns. The patient is happy she has the CCM  team for ongoing support and education. Will continue to monitor. 12-26-2021: The patient was stressed out and frustrated today. She was having to call and cancel her appointments for this month because she changed insurance plans and does not have transportation. The patient states that she cannot make the appointment for 12-29-2021. Education and support given. Reflective listening. Allowed the patient to express her anxieties. 01-30-2022: The patient is worried because yesterday she noticed blood in her urine and today she had a clot when she went to use the bathroom. Has an appointment to see the pcp tomorrow for evaluation. The patient states she has never had anything like this before. Reflective listening, education and support given to the patient. The patient states that she is unsure what is going on. She was supposed to go and have blood work today at the oncologist but the transportation services did not come and pick her up. Ask the patient to call and see if she could speak to them and ask about the reason they did not come. She states she will do this. The patient had switched insurance carriers and it may have something to do with this. 03-06-2022: The  patient states she is doing better for the most part. She has only seen a little blood in her urine recently. She was upset that the ER told her granddaughter what was going on and not her. The patient ask the RNCM to review the notes with her from the ER. Review the notes and CT results. The patient felt more at ease. The patient states that she is going to see the oncologist and then she will make an appointment to see the pcp for follow up. She denies any acute distress today. Discussed plans with patient for ongoing care management follow up and provided patient with direct contact information for care management team Advised patient to call the office for changes in mood, anxiety, or depression ; Provided education to patient re: staying positive, being around positive people, not being around negativity, pacing activity and working with the CCM team to effectively manage mental health and well being; Reviewed medications with patient and discussed compliance. 03-06-2022: The patient states compliance with medications ; Discussed plans with patient for ongoing care management follow up and provided patient with direct contact information for care management team; Screening for signs and symptoms of depression related to chronic disease state;  Assessed social determinant of health barriers;   Hyperlipidemia:  (Status: Goal on Track (progressing): YES.) Long Term Goal  Lab Results  Component Value Date   CHOL 135 08/01/2021   HDL 65 08/01/2021   LDLCALC 53 08/01/2021   TRIG 89 08/01/2021   CHOLHDL 2.1 08/01/2021     Medication review performed; medication list updated in electronic medical record. 03-06-2022: Is compliant with medications regimen. Takes Atorvastatin 40 mg daily. Works closely with the pharm D for effective management of medications.  Provider established cholesterol goals reviewed. 03-06-2022: The patient is at goal and doing well with cholesterol management Counseled on  importance of regular laboratory monitoring as prescribed. 03-06-2022: Review of getting lab work on a regular basis. ; Provided HLD educational materials; Reviewed role and benefits of statin for ASCVD risk reduction; Discussed strategies to manage statin-induced myalgias. 03-06-2022: Denies any issues at this time with medications and myalgias; Reviewed importance of limiting foods high in cholesterol. 03-06-2022: The patient is eating good and denies any concerns related to heart healthy/ADA diet. States she is staying away from sweets. That she  is trying to monitor and get sugar free sweets not sweets that are not sugar free. Education and support given.   Hypertension: (Status: Goal on Track (progressing): YES.) Last practice recorded BP readings:  BP Readings from Last 3 Encounters:  02/20/22 130/76  02/09/22 97/66  02/02/22 109/77  Most recent eGFR/CrCl:  Lab Results  Component Value Date   EGFR 72 08/01/2021    No components found for: CRCL  Evaluation of current treatment plan related to hypertension self management and patient's adherence to plan as established by provider. 03-06-2022: The patient is having normal blood pressures and denies any issues with HTN or heart health. Will continue to monitor for changes. Gave the RNCM several readings today. See chart below for readings self reported by the patient.  Provided education to patient re: stroke prevention, s/s of heart attack and stroke; Reviewed prescribed diet heart healthy/ADA diet. 03-06-2022: Review and education done.  Reviewed medications with patient and discussed importance of compliance. 03-06-2022: Is complaint with medications and works closely with the pharm D for effective management of medications.  Discussed plans with patient for ongoing care management follow up and provided patient with direct contact information for care management team; Advised patient, providing education and rationale, to monitor blood pressure  daily and record, calling PCP for findings outside established parameters; 03-06-2022: The patient gave several readings: Date Blood pressure and pulse readings  02/16/2022                                    102/75-90  02/17/2022                                       121/80-90  02/18/2022                                       104/72-84  02/19/2022                                       119/75-82   02/22/2022 99/69-87  02/23/2022 120/76-105  02/24/2022 235/57-32  02/26/2022 108/76-92  6/13//2023 107/73-76  03/01/2022 118/77-76  03/02/2022 130/90-78  03/03/2022 202/54-27  03/04/2022 112/81-88  Advised patient to discuss blood pressure trends  with provider; Provided education on prescribed diet heart healthy/ADA. 03-06-2022: Education and support given to the patient ;  Discussed complications of poorly controlled blood pressure such as heart disease, stroke, circulatory complications, vision complications, kidney impairment, sexual dysfunction;   Pain:  (Status: Goal on Track (progressing): YES.) Long Term Goal  Pain assessment performed. 08-08-2021: The patient rates her pain level at a "zero" today. She states most of the time she has pain at night that wakes her from her sleep. She says the medications help her. She follows up with the specialist on 08-16-2021. Advised the patient to discuss with the specialist when she is having pain. 10-10-2021: Denies any pain today. Sees specialist on 10-14-2021 for evaluation of rotator cuff. 11-14-2021: Denies any issues with pain or discomfort today. The patient is following specialist for rotator cuff concerns. The patient states her left leg is dragging also. Wants to discuss on upcoming visit with the pcp. 01-30-2022:  Denies any pain at this time. States she is worried about the blood in her urine. Seeing pcp tomorrow. 03-06-2022: Denies any pain or discomfort today. States that she has pain in her hand, thumb and hip where she fell in May but none today.  Medications  reviewed- 03-06-2022: Is compliant with medications. Takes Tylenol when needed Reviewed provider established plan for pain management; Discussed importance of adherence to all scheduled medical appointments.Appointment on 01-31-2022 to see the pcp Counseled on the importance of reporting any/all new or changed pain symptoms or management strategies to pain management provider; Advised patient to report to care team affect of pain on daily activities; Discussed use of relaxation techniques and/or diversional activities to assist with pain reduction (distraction, imagery, relaxation, massage, acupressure, TENS, heat, and cold application. 08-08-2021: Education on using heat application to help with pain relief. Reviewed with patient prescribed pharmacological and nonpharmacological pain relief strategies; Advised patient to discuss unrelieved pain or changes in level of intensity of pain  with provider; 08-08-2021: The patient states she needs a new BSC because the leg of  hers broke. Will collaborate with the pcp about getting the patient a new chair. 10-10-2021: Has DME. 11-14-2021: Is interested in a stand up walker to use when ambulating. Will discuss with the pcp at upcoming visit.    Falls:  (Status: Goal on Track (progressing): YES.) Long Term Goal  Provided written and verbal education re: potential causes of falls and Fall prevention strategies. 03-06-2022: Review of safety measures.  The patient had a fall in May with a visit to ER. Denies any new falls. Will continue to monitor.  Reviewed medications and discussed potential side effects of medications such as dizziness and frequent urination. 03-06-2022: Review and education provided. Advised patient of importance of notifying provider of falls. 03-06-2022: Education on notifying the provider for changes in her conditions and new falls. Will continue to monitor.  Assessed for signs and symptoms of orthostatic hypotension Assessed for falls since  last encounter. 11-14-2021: The patient states her last fall was 11-05-2021. The patient states she was on her [facility] and it threw her out of the chair. The patient states that she was not injured. Education and support given. 03-06-2022: The patient had a fall on Feb 02, 2022. The patient went and was evaluated at the ER. States she hurt her hand and hip but they are better. She denies pain or discomfort today. Will continue to monitor. Education provided on safety and calling the provider for changes in mobility. Assessed patients knowledge of fall risk prevention secondary to previously provided education Provided patient information for fall alert systems Assessed working status of life alert bracelet and patient adherence Advised patient to discuss new falls and injuries, and stand up walker she can use when ambulating with provider 11-14-2021: The patient inquired about a stand up walker to use. Will collaborate with the pcp concerning the request.  This is am Upright walker-standing.    New onset of blood in the urine  (Status: Goal on Track (progressing): YES.) Long Term Goal  Evaluation of current treatment plan related to  Blood in the urine ,  self-management and patient's adherence to plan as established by provider. 03-06-2022: The patient saw the pcp and also was evaluated in the ER on 02-02-2022. The patient had a little bit of blood in her urine recently but not much. States that she did not know what the ER said they told her granddaughter and not her. She was upset  about this. The notes from the ER read to the patient. The patient plans to make an appointment with the pcp after seeing oncologist for follow up. Education and support given. Discussed plans with patient for ongoing care management follow up and provided patient with direct contact information for care management team Advised patient to keep appointment with pcp for 03-06-2022, to monitor for foul smelling urine, pain on urination,  any other blood noted from other orifices and to be safe and not have any injuries or falls. ; Provided education to patient re: that there may be several different reasons she is having blood in her urine. She is worried because she has never had anything like this before and it is very concerning to her. She states yesterday it looked dark but today she had a clot when she went to the bathroom. Denies pain on urination or burning. Education and support given. ; Reviewed medications with patient and discussed compliance. ; Reviewed scheduled/upcoming provider appointments including plans to call and get a new appointment with the pcp once she see oncologist.; Discussed plans with patient for ongoing care management follow up and provided patient with direct contact information for care management team; Advised patient to discuss new concerns  with provider. Also to keep appointments and likely the pcp will want a urine test and blood work. Ask the patient to write down questions she may have for the pcp;   Patient Goals/Self-Care Activities: Take medications as prescribed   Attend all scheduled provider appointments Call pharmacy for medication refills 3-7 days in advance of running out of medications Attend church or other social activities Perform all self care activities independently  Perform IADL's (shopping, preparing meals, housekeeping, managing finances) independently Call provider office for new concerns or questions  Work with the social worker to address care coordination needs and will continue to work with the clinical team to address health care and disease management related needs call 911 call the Suicide and Crisis Lifeline: 988 call the [place] National Suicide Prevention Lifeline: 857-099-4210 call 1-800-273-TALK (toll free, 24 hour hotline) if experiencing a Mental Health or [place]  schedule appointment with eye doctor check blood sugar at prescribed times:  when you have symptoms of low or high blood sugar, before and after exercise, and as directed   check feet daily for cuts, sores or redness enter blood sugar readings and medication or insulin into daily log take the blood sugar log to all doctor visits trim toenails straight across drink 6 to 8 glasses of water each day eat fish at least once per week fill half of plate with vegetables keep a food diary limit fast food meals to no more than 1 per week manage portion size prepare main meal at home 3 to 5 days each week read food labels for fat, fiber, carbohydrates and portion size keep feet up while sitting wash and dry feet carefully every day wear comfortable, cotton socks wear comfortable, well-fitting shoes - avoid second hand smoke - eliminate smoking in my home - identify and remove indoor air pollutants - limit outdoor activity during cold weather - listen for public air quality announcements every day - do breathing exercises every day - develop a rescue plan - eliminate symptom triggers at home - follow rescue plan if symptoms flare-up - use an extra pillow to sleep - develop a new routine to improve sleep - don't eat or exercise right before bedtime - get at least 7 to 8 hours  of sleep at night - use devices that will help like a cane, sock-puller or reacher - practice relaxation or meditation daily - do exercises in a comfortable position that makes breathing as easy as possible check blood pressure daily choose a place to take my blood pressure (home, clinic or office, retail store) write blood pressure results in a log or diary learn about high blood pressure keep a blood pressure log take blood pressure log to all doctor appointments call doctor for signs and symptoms of high blood pressure develop an action plan for high blood pressure keep all doctor appointments take medications for blood pressure exactly as prescribed report new symptoms to your  doctor eat more whole grains, fruits and vegetables, lean meats and healthy fats - call for medicine refill 2 or 3 days before it runs out - take all medications exactly as prescribed - call doctor with any symptoms you believe are related to your medicine - call doctor when you experience any new symptoms - go to all doctor appointments as scheduled - adhere to prescribed diet: heart healthy/ADA diet        Plan:Telephone follow up appointment with care management team member scheduled for:  05-08-2022 at 145 pm  [place], MSN, [place] Mobile: [phone]

## 2022-03-06 NOTE — Patient Instructions (Signed)
Visit Information  Thank you for taking time to visit with me today. Please don't hesitate to contact me if I can be of assistance to you before our next scheduled telephone appointment.  Following are the goals we discussed today:  COPD: (Status: Goal on Track (progressing): YES.) Long Term Goal  Reviewed medications with patient, including use of prescribed maintenance and rescue inhalers, and provided instruction on medication management and the importance of adherence. 03-06-2022: The patient is compliant with medications. Denies any medications needs today Provided patient with basic written and verbal COPD education on self care/management/and exacerbation prevention. 10-10-2021: The patient was in the ER for evaluation of chest pain on 10-06-2021. The patient states that they told her she had "bronchitis" but did not give her any medications. The patient states she has a runny nose and the drainage is clear. Denies fever or chills, education and appointment secured for the patient to talk to pcp on 10-11-2021 via phone at 4 pm. Review and confirmed with the patient. 11-14-2021: The patient states she is doing better and denies any issues related to COPD or changes in her breathing. She has stopped smoking and continues to refrain from smoking. Praised the patient for positive changes made for her health and well being. The patient says it is hard not to smoke but she is dedicated to refrain from smoking. 03-06-2022: The patient denies any exacerbations with her COPD. States her condition is stable. No new concerns with her breathing at this time. Knows to call for changes. Advised patient to track and manage COPD triggers. 11-14-2021: Review with the patient and education provider on factors that can trigger COPD exacerbation. The patient states the cold and raining weather causes issues and she knows to monitor closely for changes in her condition. 03-06-2022: The patient states her COPD is stable at this  time. Knows factors that trigger an exacerbation. Provided written and verbal instructions on pursed lip breathing and utilized returned demonstration as teach back. 03-06-2022: The patient works with the [facility] on a regular basis. Knows to call for changes or new needs.  Provided instruction about proper use of medications used for management of COPD including inhalers Advised patient to self assesses COPD action plan zone and make appointment with provider if in the yellow zone for 48 hours without improvement Advised patient to engage in light exercise as tolerated 3-5 days a week to aid in the the management of COPD. 03-06-2022: Review with the patient to pace activities and be safe. Denies any acute findings today. States she is doing okay. Denies any new falls since May Provided education about and advised patient to utilize infection prevention strategies to reduce risk of respiratory infection. 03-06-2022: The patient denies any issues with her COPD. Knows the factors that contribute to increase risk of exacerbation. Will continue to monitor for changes or new needs.  Discussed the importance of adequate rest and management of fatigue with COPD. 03-06-2022: Review and the patient is staying safe and denies any acute findings. Denies any issues with CP or respiratory conditions.  Screening for signs and symptoms of depression related to chronic disease state  Assessed social determinant of health barriers   Diabetes:  (Status: Goal on Track (progressing): YES.) Long Term Goal         Lab Results  Component Value Date    HGBA1C 7.7 (H) 08/01/2021  Assessed patient's understanding of A1c goal: <7%. 08-08-2021: States her A1C was elevated this time. Review of steroid shots  may have caused some of the reason for elevation. The patient states she did not have her paper with her and is having to write down her numbers. Will mail the patient more sheets to record blood pressures and blood sugars.  03-06-2022: Denies any acute findings related to DM. Gave the RNCM several readings related to DM management. Reminder to get regular A1c levels drawn. See updated list below.  Provided education to patient about basic DM disease process. 03-06-2022: Review of DM and the patient states she feels fine related to her DM health and well being. ; Reviewed medications with patient and discussed importance of medication adherence. 08-08-2021: The patient has started back on Metformin 500 mg BID, endorses compliance. Denies any issues.  03-06-2022: The patient is compliant with medications, denies any acute findings. The patient works with the pharm D on a regular basis for effective management of medications for DM and other chronic conditions.     Reviewed prescribed diet with patient heart healthy/ADA- 03-06-2022: The patient is compliant with heart healthy/ADA diet ; Counseled on importance of regular laboratory monitoring as prescribed. 03-06-2022: The patient is compliant with getting regular lab work for evaluation;        Discussed plans with patient for ongoing care management follow up and provided patient with direct contact information for care management team.  Provided patient with written educational materials related to hypo and hyperglycemia and importance of correct treatment. 10-10-2021: Denies any lows but states she knows how to monitor and treat . 11-14-2021: States compliance with checking blood sugars. Denies any lows and states the lowest she has seen is 92. Highest is 243 and that was an isolated incident. She is mindful of her blood sugar readings and does not want to go back on the "needle". Education and support given.   12-26-2021: The patient states her blood sugars have been good. Did not provide readings today. Last one on 12-13-2021 was 134 fasting. The patient was a little stressed today over transportation needs. Education and support provided.  03-06-2022: The patient denies any real lows  or real highs. Lowest the patient has been is 98 and the highest is 173. Reviewed scheduled/upcoming provider appointments including: States once she sees oncology she will call for a follow up appointment with the pcp Advised patient, providing education and rationale, to check cbg before meals and at bedtime, when you have symptoms of low or high blood sugar, and before and after exercise and record. 10-10-2021: The patient takes blood sugars and records. Denies any acute findings.   03-06-2022: Takes readings on a consistent basis. See chart below for most recent readings:    BS readings        02/24/2022 120 01/27/2022 111  02/25/2022 119      02/26/2022 98      02/28/2022 173      03/01/2022 128      03/02/2022 128      03/03/2022 128      call provider for findings outside established parameters;       Review of patient status, including review of consultants reports, relevant laboratory and other test results, and medications completed;       Review of last eye exam. 11-14-2021: The patient states it has been about a year and a half since she has had an eye exam. Ask the patient to get an appointment for follow up with her eye doctor and the importance of regular eye exams.    Depression  (  Status: Goal on Track (progressing): YES.) Long Term Goal  Evaluation of current treatment plan related to Depression, Mental Health Concerns  self-management and patient's adherence to plan as established by provider. 10-10-2021: The patient denies any acute distress. She is not feeling the best right now due to her recent visit to the ER and diagnosis of bronchitis. She also has appointments coming up to have her rotator cuff evaluated. The patient states that she is just wanting to feel better. Denies any mental health needs at this time. 11-14-2021: The patient states that she is doing well and denies any issues with depression or anxiety at this time. Did have a fall a week ago Saturday. States that she is okay and  not injured. Discussed safety and falls prevention. Also encouraged the patient to continue to work with CCM team for needs and concerns. The patient is happy she has the CCM team for ongoing support and education. Will continue to monitor. 12-26-2021: The patient was stressed out and frustrated today. She was having to call and cancel her appointments for this month because she changed insurance plans and does not have transportation. The patient states that she cannot make the appointment for 12-29-2021. Education and support given. Reflective listening. Allowed the patient to express her anxieties. 01-30-2022: The patient is worried because yesterday she noticed blood in her urine and today she had a clot when she went to use the bathroom. Has an appointment to see the pcp tomorrow for evaluation. The patient states she has never had anything like this before. Reflective listening, education and support given to the patient. The patient states that she is unsure what is going on. She was supposed to go and have blood work today at the oncologist but the transportation services did not come and pick her up. Ask the patient to call and see if she could speak to them and ask about the reason they did not come. She states she will do this. The patient had switched insurance carriers and it may have something to do with this. 03-06-2022: The patient states she is doing better for the most part. She has only seen a little blood in her urine recently. She was upset that the ER told her granddaughter what was going on and not her. The patient ask the RNCM to review the notes with her from the ER. Review the notes and CT results. The patient felt more at ease. The patient states that she is going to see the oncologist and then she will make an appointment to see the pcp for follow up. She denies any acute distress today. Discussed plans with patient for ongoing care management follow up and provided patient with direct  contact information for care management team Advised patient to call the office for changes in mood, anxiety, or depression ; Provided education to patient re: staying positive, being around positive people, not being around negativity, pacing activity and working with the CCM team to effectively manage mental health and well being; Reviewed medications with patient and discussed compliance. 03-06-2022: The patient states compliance with medications ; Discussed plans with patient for ongoing care management follow up and provided patient with direct contact information for care management team; Screening for signs and symptoms of depression related to chronic disease state;  Assessed social determinant of health barriers;    Hyperlipidemia:  (Status: Goal on Track (progressing): YES.) Long Term Goal       Lab Results  Component Value Date  CHOL 135 08/01/2021    HDL 65 08/01/2021    LDLCALC 53 08/01/2021    TRIG 89 08/01/2021    CHOLHDL 2.1 08/01/2021      Medication review performed; medication list updated in electronic medical record. 03-06-2022: Is compliant with medications regimen. Takes Atorvastatin 40 mg daily. Works closely with the pharm D for effective management of medications.  Provider established cholesterol goals reviewed. 03-06-2022: The patient is at goal and doing well with cholesterol management Counseled on importance of regular laboratory monitoring as prescribed. 03-06-2022: Review of getting lab work on a regular basis. ; Provided HLD educational materials; Reviewed role and benefits of statin for ASCVD risk reduction; Discussed strategies to manage statin-induced myalgias. 03-06-2022: Denies any issues at this time with medications and myalgias; Reviewed importance of limiting foods high in cholesterol. 03-06-2022: The patient is eating good and denies any concerns related to heart healthy/ADA diet. States she is staying away from sweets. That she is trying to monitor  and get sugar free sweets not sweets that are not sugar free. Education and support given.    Hypertension: (Status: Goal on Track (progressing): YES.) Last practice recorded BP readings:     BP Readings from Last 3 Encounters:  02/20/22 130/76  02/09/22 97/66  02/02/22 109/77  Most recent eGFR/CrCl:       Lab Results  Component Value Date    EGFR 72 08/01/2021    No components found for: CRCL   Evaluation of current treatment plan related to hypertension self management and patient's adherence to plan as established by provider. 03-06-2022: The patient is having normal blood pressures and denies any issues with HTN or heart health. Will continue to monitor for changes. Gave the RNCM several readings today. See chart below for readings self reported by the patient.  Provided education to patient re: stroke prevention, s/s of heart attack and stroke; Reviewed prescribed diet heart healthy/ADA diet. 03-06-2022: Review and education done.  Reviewed medications with patient and discussed importance of compliance. 03-06-2022: Is complaint with medications and works closely with the pharm D for effective management of medications.  Discussed plans with patient for ongoing care management follow up and provided patient with direct contact information for care management team; Advised patient, providing education and rationale, to monitor blood pressure daily and record, calling PCP for findings outside established parameters; 03-06-2022: The patient gave several readings: Date Blood pressure and pulse readings  02/16/2022                                    102/75-90  02/17/2022                                       121/80-90  02/18/2022                                       104/72-84  02/19/2022                                       119/75-82    02/22/2022 99/69-87  02/23/2022 120/76-105  02/24/2022 116/78-94  02/26/2022 108/76-92  6/13//2023 107/73-76  03/01/2022 118/77-76  03/02/2022 093/26-71  03/03/2022  932/67-12  03/04/2022 112/81-88  Advised patient to discuss blood pressure trends  with provider; Provided education on prescribed diet heart healthy/ADA. 03-06-2022: Education and support given to the patient ;  Discussed complications of poorly controlled blood pressure such as heart disease, stroke, circulatory complications, vision complications, kidney impairment, sexual dysfunction;    Pain:  (Status: Goal on Track (progressing): YES.) Long Term Goal  Pain assessment performed. 08-08-2021: The patient rates her pain level at a "zero" today. She states most of the time she has pain at night that wakes her from her sleep. She says the medications help her. She follows up with the specialist on 08-16-2021. Advised the patient to discuss with the specialist when she is having pain. 10-10-2021: Denies any pain today. Sees specialist on 10-14-2021 for evaluation of rotator cuff. 11-14-2021: Denies any issues with pain or discomfort today. The patient is following specialist for rotator cuff concerns. The patient states her left leg is dragging also. Wants to discuss on upcoming visit with the pcp. 01-30-2022: Denies any pain at this time. States she is worried about the blood in her urine. Seeing pcp tomorrow. 03-06-2022: Denies any pain or discomfort today. States that she has pain in her hand, thumb and hip where she fell in May but none today.  Medications reviewed- 03-06-2022: Is compliant with medications. Takes Tylenol when needed Reviewed provider established plan for pain management; Discussed importance of adherence to all scheduled medical appointments.Appointment on 01-31-2022 to see the pcp Counseled on the importance of reporting any/all new or changed pain symptoms or management strategies to pain management provider; Advised patient to report to care team affect of pain on daily activities; Discussed use of relaxation techniques and/or diversional activities to assist with pain reduction  (distraction, imagery, relaxation, massage, acupressure, TENS, heat, and cold application. 08-08-2021: Education on using heat application to help with pain relief. Reviewed with patient prescribed pharmacological and nonpharmacological pain relief strategies; Advised patient to discuss unrelieved pain or changes in level of intensity of pain  with provider; 08-08-2021: The patient states she needs a new BSC because the leg of  hers broke. Will collaborate with the pcp about getting the patient a new chair. 10-10-2021: Has DME. 11-14-2021: Is interested in a stand up walker to use when ambulating. Will discuss with the pcp at upcoming visit.      Falls:  (Status: Goal on Track (progressing): YES.) Long Term Goal  Provided written and verbal education re: potential causes of falls and Fall prevention strategies. 03-06-2022: Review of safety measures.  The patient had a fall in May with a visit to ER. Denies any new falls. Will continue to monitor.  Reviewed medications and discussed potential side effects of medications such as dizziness and frequent urination. 03-06-2022: Review and education provided. Advised patient of importance of notifying provider of falls. 03-06-2022: Education on notifying the provider for changes in her conditions and new falls. Will continue to monitor.  Assessed for signs and symptoms of orthostatic hypotension Assessed for falls since last encounter. 11-14-2021: The patient states her last fall was 11-05-2021. The patient states she was on her [facility] and it threw her out of the chair. The patient states that she was not injured. Education and support given. 03-06-2022: The patient had a fall on Feb 02, 2022. The patient went and was evaluated at the ER. States she hurt her hand and hip but they are better. She denies pain or discomfort today. Will continue to monitor. Education  provided on safety and calling the provider for changes in mobility. Assessed patients knowledge of fall risk  prevention secondary to previously provided education Provided patient information for fall alert systems Assessed working status of life alert bracelet and patient adherence Advised patient to discuss new falls and injuries, and stand up walker she can use when ambulating with provider 11-14-2021: The patient inquired about a stand up walker to use. Will collaborate with the pcp concerning the request.  This is am Upright walker-standing.      New onset of blood in the urine  (Status: Goal on Track (progressing): YES.) Long Term Goal  Evaluation of current treatment plan related to  Blood in the urine ,  self-management and patient's adherence to plan as established by provider. 03-06-2022: The patient saw the pcp and also was evaluated in the ER on 02-02-2022. The patient had a little bit of blood in her urine recently but not much. States that she did not know what the ER said they told her granddaughter and not her. She was upset about this. The notes from the ER read to the patient. The patient plans to make an appointment with the pcp after seeing oncologist for follow up. Education and support given. Discussed plans with patient for ongoing care management follow up and provided patient with direct contact information for care management team Advised patient to keep appointment with pcp for 03-06-2022, to monitor for foul smelling urine, pain on urination, any other blood noted from other orifices and to be safe and not have any injuries or falls. ; Provided education to patient re: that there may be several different reasons she is having blood in her urine. She is worried because she has never had anything like this before and it is very concerning to her. She states yesterday it looked dark but today she had a clot when she went to the bathroom. Denies pain on urination or burning. Education and support given. ; Reviewed medications with patient and discussed compliance. ; Reviewed  scheduled/upcoming provider appointments including plans to call and get a new appointment with the pcp once she see oncologist.; Discussed plans with patient for ongoing care management follow up and provided patient with direct contact information for care management team; Advised patient to discuss new concerns  with provider. Also to keep appointments and likely the pcp will want a urine test and blood work. Ask the patient to write down questions she may have for the pcp;     Our next appointment is by telephone on 05-08-2022 at 145 pm  Please call the care guide team at [phone] if you need to cancel or reschedule your appointment.   If you are experiencing a Mental Health or [place] or need someone to talk to, please call the Suicide and Crisis Lifeline: 988 call the [place] National Suicide Prevention Lifeline: (813)529-2759 or TTY: 863-511-7842 TTY (514)541-7810) to talk to a trained counselor call 1-800-273-TALK (toll free, 24 hour hotline)   The patient verbalized understanding of instructions, educational materials, and care plan provided today and DECLINED offer to receive copy of patient instructions, educational materials, and care plan.   [name] RN, MSN, [place] Mobile: [phone]

## 2022-03-13 ENCOUNTER — Other Ambulatory Visit: Payer: 59 | Admitting: Urology

## 2022-03-15 ENCOUNTER — Other Ambulatory Visit: Payer: Self-pay | Admitting: Internal Medicine

## 2022-03-15 NOTE — Telephone Encounter (Signed)
Requested Prescriptions  Pending Prescriptions Disp Refills  . ACCU-CHEK GUIDE test strip [Pharmacy Med Name: ACCU-CHEK GUIDE STRIP] 400 each 0    Sig: USE AS DIRECTED. UP TO 4 TIMES DAILY ONCE DAILY     Endocrinology: Diabetes - Testing Supplies Passed - 03/15/2022  1:10 PM      Passed - Valid encounter within last 12 months    Recent Outpatient Visits          5 months ago Nonspecific chest pain   [facility] [place], [place], [place]   5 months ago Chest pain, unspecified type   [name] [place], [place]alie Keens, NP   7 months ago Controlled type 2 diabetes mellitus with complication, without long-term current use of insulin [facility])   [facility], NP   8 months ago Chronic right shoulder pain   [place], Coralie Keens, NP   9 months ago Aortic atherosclerosis [name])   [facility], NP             . Accu-Chek Softclix Lancets lancets [Pharmacy Med Name: ACCU-CHEK SOFTCLIX LANCETS] 400 each 0    Sig: USE AS DIRECTED. UP TO 4 TIMES A DAY     Endocrinology: Diabetes - Testing Supplies Passed - 03/15/2022  1:10 PM      Passed - Valid encounter within last 12 months    Recent Outpatient Visits          5 months ago Nonspecific chest pain   [facility] [place], [place], NP   5 months ago Chest pain, unspecified type   [facility] [place], Coralie Keens, NP   7 months ago Controlled type 2 diabetes mellitus with complication, without long-term current use of insulin [facility])   [facility], NP   8 months ago Chronic right shoulder pain   [facility] [place], Coralie Keens, NP   9 months ago Aortic atherosclerosis [facility])   [name], Coralie Keens, NP

## 2022-03-15 NOTE — Telephone Encounter (Signed)
Requested medication (s) are due for refill today: yes  Requested medication (s) are on the active medication list: yes  Last refill:  12/14/21 #180  Future visit scheduled: no  Notes to clinic:  overdue repeat Hgb A1C   Requested Prescriptions  Pending Prescriptions Disp Refills   metFORMIN (GLUCOPHAGE) 500 MG tablet [Pharmacy Med Name: METFORMIN HCL 500 MG TAB] 180 tablet     Sig: TAKE 1 TABLET BY MOUTH TWICE DAILY WITH A MEAL     Endocrinology:  Diabetes - Biguanides Failed - 03/15/2022  3:10 PM      Failed - HBA1C is between 0 and 7.9 and within 180 days    Hemoglobin A1C  Date Value Ref Range Status  09/02/2014 9.5 (H) 4.2 - 6.3 % Final    Comment:    The American Diabetes Association recommends that a primary goal of therapy should be <7% and that physicians should reevaluate the treatment regimen in patients with HbA1c values consistently >8%.    Hgb A1c MFr Bld  Date Value Ref Range Status  08/01/2021 7.7 (H) <5.7 % of total Hgb Final    Comment:    For someone without known diabetes, a hemoglobin A1c value of 6.5% or greater indicates that they may have  diabetes and this should be confirmed with a follow-up  test. . For someone with known diabetes, a value <7% indicates  that their diabetes is well controlled and a value  greater than or equal to 7% indicates suboptimal  control. A1c targets should be individualized based on  duration of diabetes, age, comorbid conditions, and  other considerations. . Currently, no consensus exists regarding use of hemoglobin A1c for diagnosis of diabetes for children. .          Passed - Cr in normal range and within 360 days    Creat  Date Value Ref Range Status  08/01/2021 0.85 0.60 - 1.00 mg/dL Final   Creatinine, Ser  Date Value Ref Range Status  02/02/2022 0.84 0.44 - 1.00 mg/dL Final   Creatinine, Urine  Date Value Ref Range Status  05/31/2021 89 20 - 275 mg/dL Final         Passed - eGFR in normal range  and within 360 days    GFR, Est African American  Date Value Ref Range Status  06/14/2020 82 > OR = 60 mL/min/1.40m2 Final   GFR, Est Non African American  Date Value Ref Range Status  06/14/2020 70 > OR = 60 mL/min/1.22m2 Final   GFR, Estimated  Date Value Ref Range Status  02/02/2022 >60 >60 mL/min Final    Comment:    (NOTE) Calculated using the CKD-EPI Creatinine Equation (2021)    eGFR  Date Value Ref Range Status  08/01/2021 72 > OR = 60 mL/min/1.40m2 Final    Comment:    The eGFR is based on the CKD-EPI 2021 equation. To calculate  the new eGFR from a previous Creatinine or Cystatin C result, go to https://www.kidney.org/professionals/ kdoqi/gfr%5Fcalculator          Passed - B12 Level in normal range and within 720 days    Vitamin B-12  Date Value Ref Range Status  01/24/2022 378 180 - 914 pg/mL Final    Comment:    (NOTE) This assay is not validated for testing neonatal or myeloproliferative syndrome specimens for Vitamin B12 levels. Performed at [facility], [place] [address] [place], [place] [address]  Passed - Valid encounter within last 6 months    Recent Outpatient Visits           5 months ago Nonspecific chest pain   [facility] [place], [place], [place]   5 months ago Chest pain, unspecified type   [name] [place], Coralie Keens, NP   7 months ago Controlled type 2 diabetes mellitus with complication, without long-term current use of insulin ([place])   [facility] [place], Coralie Keens, NP   8 months ago Chronic right shoulder pain   [facility] [place], Coralie Keens, NP   9 months ago Aortic atherosclerosis [facility]   [facility], Coralie Keens, NP              Passed - CBC within normal limits and completed in the last 12 months    WBC  Date Value Ref Range Status  02/02/2022 4.9 4.0 - 10.5 K/uL Final   RBC  Date Value Ref Range Status  02/02/2022 3.42 (L) 3.87 -  5.11 MIL/uL Final   Hemoglobin  Date Value Ref Range Status  02/02/2022 10.6 (L) 12.0 - 15.0 g/dL Final   HGB  Date Value Ref Range Status  09/01/2014 12.7 12.0 - 16.0 g/dL Final   HCT  Date Value Ref Range Status  02/02/2022 33.2 (L) 36.0 - 46.0 % Final  09/01/2014 37.9 35.0 - 47.0 % Final   MCHC  Date Value Ref Range Status  02/02/2022 31.9 30.0 - 36.0 g/dL Final   [facility]  Date Value Ref Range Status  02/02/2022 31.0 26.0 - 34.0 pg Final   MCV  Date Value Ref Range Status  02/02/2022 97.1 80.0 - 100.0 fL Final  09/01/2014 92 80 - 100 fL Final   No results found for: "PLTCOUNTKUC", "LABPLAT", "POCPLA" RDW  Date Value Ref Range Status  02/02/2022 13.6 11.5 - 15.5 % Final  09/01/2014 13.9 11.5 - 14.5 % Final         Refused Prescriptions Disp Refills   clopidogrel (PLAVIX) 75 MG tablet [Pharmacy Med Name: CLOPIDOGREL BISULFATE 75 MG TAB] 90 tablet     Sig: TAKE 1 TABLET BY MOUTH ONCE DAILY     Hematology: Antiplatelets - clopidogrel Failed - 03/15/2022  3:10 PM      Failed - HCT in normal range and within 180 days    HCT  Date Value Ref Range Status  02/02/2022 33.2 (L) 36.0 - 46.0 % Final  09/01/2014 37.9 35.0 - 47.0 % Final         Failed - HGB in normal range and within 180 days    Hemoglobin  Date Value Ref Range Status  02/02/2022 10.6 (L) 12.0 - 15.0 g/dL Final   HGB  Date Value Ref Range Status  09/01/2014 12.7 12.0 - 16.0 g/dL Final         Passed - PLT in normal range and within 180 days    Platelets  Date Value Ref Range Status  02/02/2022 267 150 - 400 K/uL Final   Platelet  Date Value Ref Range Status  09/05/2014 260 150 - 440 x10 3/mm 3 Final         Passed - Cr in normal range and within 360 days    Creat  Date Value Ref Range Status  08/01/2021 0.85 0.60 - 1.00 mg/dL Final   Creatinine, Ser  Date Value Ref Range Status  02/02/2022 0.84 0.44 - 1.00 mg/dL Final  Creatinine, Urine  Date Value Ref Range Status  05/31/2021 89 20 -  275 mg/dL Final         Passed - Valid encounter within last 6 months    Recent Outpatient Visits           5 months ago Nonspecific chest pain   [facility] [place], [place], [place]   5 months ago Chest pain, unspecified type   [facility] [place], Coralie Keens, NP   7 months ago Controlled type 2 diabetes mellitus with complication, without long-term current use of insulin [facility])   [name], NP   8 months ago Chronic right shoulder pain   [name] [place], Coralie Keens, NP   9 months ago Aortic atherosclerosis [facility])   [facility], Coralie Keens, NP

## 2022-03-15 NOTE — Telephone Encounter (Signed)
Requested Prescriptions  Pending Prescriptions Disp Refills  . metFORMIN (GLUCOPHAGE) 500 MG tablet [Pharmacy Med Name: METFORMIN HCL 500 MG TAB] 180 tablet     Sig: TAKE 1 TABLET BY MOUTH TWICE DAILY WITH A MEAL     Endocrinology:  Diabetes - Biguanides Failed - 03/15/2022  3:10 PM      Failed - HBA1C is between 0 and 7.9 and within 180 days    Hemoglobin A1C  Date Value Ref Range Status  09/02/2014 9.5 (H) 4.2 - 6.3 % Final    Comment:    The American Diabetes Association recommends that a primary goal of therapy should be <7% and that physicians should reevaluate the treatment regimen in patients with HbA1c values consistently >8%.    Hgb A1c MFr Bld  Date Value Ref Range Status  08/01/2021 7.7 (H) <5.7 % of total Hgb Final    Comment:    For someone without known diabetes, a hemoglobin A1c value of 6.5% or greater indicates that they may have  diabetes and this should be confirmed with a follow-up  test. . For someone with known diabetes, a value <7% indicates  that their diabetes is well controlled and a value  greater than or equal to 7% indicates suboptimal  control. A1c targets should be individualized based on  duration of diabetes, age, comorbid conditions, and  other considerations. . Currently, no consensus exists regarding use of hemoglobin A1c for diagnosis of diabetes for children. .          Passed - Cr in normal range and within 360 days    Creat  Date Value Ref Range Status  08/01/2021 0.85 0.60 - 1.00 mg/dL Final   Creatinine, Ser  Date Value Ref Range Status  02/02/2022 0.84 0.44 - 1.00 mg/dL Final   Creatinine, Urine  Date Value Ref Range Status  05/31/2021 89 20 - 275 mg/dL Final         Passed - eGFR in normal range and within 360 days    GFR, Est African American  Date Value Ref Range Status  06/14/2020 82 > OR = 60 mL/min/1.11m2 Final   GFR, Est Non African American  Date Value Ref Range Status  06/14/2020 70 > OR = 60 mL/min/1.23m2  Final   GFR, Estimated  Date Value Ref Range Status  02/02/2022 >60 >60 mL/min Final    Comment:    (NOTE) Calculated using the CKD-EPI Creatinine Equation (2021)    eGFR  Date Value Ref Range Status  08/01/2021 72 > OR = 60 mL/min/1.48m2 Final    Comment:    The eGFR is based on the CKD-EPI 2021 equation. To calculate  the new eGFR from a previous Creatinine or Cystatin C result, go to https://www.kidney.org/professionals/ kdoqi/gfr%5Fcalculator          Passed - B12 Level in normal range and within 720 days    Vitamin B-12  Date Value Ref Range Status  01/24/2022 378 180 - 914 pg/mL Final    Comment:    (NOTE) This assay is not validated for testing neonatal or myeloproliferative syndrome specimens for Vitamin B12 levels. Performed at [facility], [place] [address], [place], [place] [address]          Passed - Valid encounter within last 6 months    Recent Outpatient Visits          5 months ago Nonspecific chest pain   [facility] [place], Coralie Keens, [place]   5  months ago Chest pain, unspecified type   [facility] [place], Coralie Keens, NP   7 months ago Controlled type 2 diabetes mellitus with complication, without long-term current use of insulin ([place])   [facility] [place], Coralie Keens, NP   8 months ago Chronic right shoulder pain   [facility] [place], Coralie Keens, NP   9 months ago Aortic atherosclerosis [facility])   [facility] [place], Coralie Keens, NP             Passed - CBC within normal limits and completed in the last 12 months    WBC  Date Value Ref Range Status  02/02/2022 4.9 4.0 - 10.5 K/uL Final   RBC  Date Value Ref Range Status  02/02/2022 3.42 (L) 3.87 - 5.11 MIL/uL Final   Hemoglobin  Date Value Ref Range Status  02/02/2022 10.6 (L) 12.0 - 15.0 g/dL Final   HGB  Date Value Ref Range Status  09/01/2014 12.7 12.0 - 16.0 g/dL Final   HCT  Date Value Ref Range Status   02/02/2022 33.2 (L) 36.0 - 46.0 % Final  09/01/2014 37.9 35.0 - 47.0 % Final   MCHC  Date Value Ref Range Status  02/02/2022 31.9 30.0 - 36.0 g/dL Final   [facility]  Date Value Ref Range Status  02/02/2022 31.0 26.0 - 34.0 pg Final   MCV  Date Value Ref Range Status  02/02/2022 97.1 80.0 - 100.0 fL Final  09/01/2014 92 80 - 100 fL Final   No results found for: "PLTCOUNTKUC", "LABPLAT", "POCPLA" RDW  Date Value Ref Range Status  02/02/2022 13.6 11.5 - 15.5 % Final  09/01/2014 13.9 11.5 - 14.5 % Final         Refused Prescriptions Disp Refills  . clopidogrel (PLAVIX) 75 MG tablet [Pharmacy Med Name: CLOPIDOGREL BISULFATE 75 MG TAB] 90 tablet     Sig: TAKE 1 TABLET BY MOUTH ONCE DAILY     Hematology: Antiplatelets - clopidogrel Failed - 03/15/2022  3:10 PM      Failed - HCT in normal range and within 180 days    HCT  Date Value Ref Range Status  02/02/2022 33.2 (L) 36.0 - 46.0 % Final  09/01/2014 37.9 35.0 - 47.0 % Final         Failed - HGB in normal range and within 180 days    Hemoglobin  Date Value Ref Range Status  02/02/2022 10.6 (L) 12.0 - 15.0 g/dL Final   HGB  Date Value Ref Range Status  09/01/2014 12.7 12.0 - 16.0 g/dL Final         Passed - PLT in normal range and within 180 days    Platelets  Date Value Ref Range Status  02/02/2022 267 150 - 400 K/uL Final   Platelet  Date Value Ref Range Status  09/05/2014 260 150 - 440 x10 3/mm 3 Final         Passed - Cr in normal range and within 360 days    Creat  Date Value Ref Range Status  08/01/2021 0.85 0.60 - 1.00 mg/dL Final   Creatinine, Ser  Date Value Ref Range Status  02/02/2022 0.84 0.44 - 1.00 mg/dL Final   Creatinine, Urine  Date Value Ref Range Status  05/31/2021 89 20 - 275 mg/dL Final         Passed - Valid encounter within last 6 months    Recent Outpatient Visits  5 months ago Nonspecific chest pain   [facility] [place], [place], [place]   5 months ago Chest  pain, unspecified type   [facility] [place], Coralie Keens, NP   7 months ago Controlled type 2 diabetes mellitus with complication, without long-term current use of insulin [facility])   [facility] [place], Coralie Keens, NP   8 months ago Chronic right shoulder pain   [facility] [place], Coralie Keens, NP   9 months ago Aortic atherosclerosis [name])   [facility], Coralie Keens, NP

## 2022-03-17 DIAGNOSIS — E785 Hyperlipidemia, unspecified: Secondary | ICD-10-CM | POA: Diagnosis not present

## 2022-03-17 DIAGNOSIS — E1159 Type 2 diabetes mellitus with other circulatory complications: Secondary | ICD-10-CM | POA: Diagnosis not present

## 2022-03-17 DIAGNOSIS — I10 Essential (primary) hypertension: Secondary | ICD-10-CM | POA: Diagnosis not present

## 2022-03-17 DIAGNOSIS — J449 Chronic obstructive pulmonary disease, unspecified: Secondary | ICD-10-CM

## 2022-03-17 DIAGNOSIS — D472 Monoclonal gammopathy: Secondary | ICD-10-CM | POA: Diagnosis not present

## 2022-03-17 DIAGNOSIS — F17210 Nicotine dependence, cigarettes, uncomplicated: Secondary | ICD-10-CM

## 2022-03-17 DIAGNOSIS — Z7984 Long term (current) use of oral hypoglycemic drugs: Secondary | ICD-10-CM

## 2022-03-17 DIAGNOSIS — F32A Depression, unspecified: Secondary | ICD-10-CM

## 2022-03-18 ENCOUNTER — Encounter: Payer: Self-pay | Admitting: Oncology

## 2022-03-20 ENCOUNTER — Telehealth: Payer: Self-pay | Admitting: Internal Medicine

## 2022-03-20 NOTE — Telephone Encounter (Signed)
[facility] called and spoke to [place], [facility] about the refill(s) Accu-Check Softclix Lancets requested. Advised it was sent on 03/15/22 #400/0 refill(s). She says they have it, but the patient needs a new Accu-Check Softclix Lancing device so insurance will cover.

## 2022-03-20 NOTE — Telephone Encounter (Signed)
Medication Refill - Medication: Accu-Chek Softclix Lancets lancets  Has the patient contacted their pharmacy? yes (Agent: If no, request that the patient contact the pharmacy for the refill. If patient does not wish to contact the pharmacy document the reason why and proceed with request.) (Agent: If yes, when and what did the pharmacy advise?)contact pcp  Preferred Pharmacy (with phone number or street name):  [facility], [place]. Phone:  [phone]  Fax:  [phone]     Has the patient been seen for an appointment in the last year OR does the patient have an upcoming appointment? yes  Agent: Please be advised that RX refills may take up to 3 business days. We ask that you follow-up with your pharmacy.

## 2022-03-22 NOTE — Telephone Encounter (Signed)
Kendra Baas, do you know what it is she needs? I wasn't aware there are lancing devices, I only though there were lancets. We may need to call the pharmacy.

## 2022-03-22 NOTE — Telephone Encounter (Signed)
Called [name] and gave a verbal order for a lancing device.    Thanks,   -Mickel Baas

## 2022-03-23 ENCOUNTER — Encounter: Payer: Self-pay | Admitting: Oncology

## 2022-03-24 ENCOUNTER — Telehealth: Payer: Medicare Other

## 2022-03-24 ENCOUNTER — Ambulatory Visit: Payer: Self-pay | Admitting: *Deleted

## 2022-03-24 NOTE — Telephone Encounter (Signed)
Summary: urinary discomfort / rx req   The patient was previously seen for a bladder infection   The patient shares that they have began to experience frequent urination and a burning sensation when they urinate again   The patient would like to be prescribed something for their discomfort   Please contact further when possible     Attempted to call the work number- unavailable number  Reason for Disposition  [1] SEVERE pain with urination (e.g., excruciating) AND [2] not improved after 2 hours of pain medicine and Sitz bath  Answer Assessment - Initial Assessment Questions 1. SEVERITY: "How bad is the pain?"  (e.g., Scale 1-10; mild, moderate, or severe)   - MILD (1-3): complains slightly about urination hurting   - MODERATE (4-7): interferes with normal activities     - SEVERE (8-10): excruciating, unwilling or unable to urinate because of the pain      severe 2. FREQUENCY: "How many times have you had painful urination today?"      Yes- 3-4 times 3. PATTERN: "Is pain present every time you urinate or just sometimes?"      yes 4. ONSET: "When did the painful urination start?"      yesterday 5. FEVER: "Do you have a fever?" If Yes, ask: "What is your temperature, how was it measured, and when did it start?"     no 6. PAST UTI: "Have you had a urine infection before?" If Yes, ask: "When was the last time?" and "What happened that time?"      Yes- chronic 7. CAUSE: "What do you think is causing the painful urination?"  (e.g., UTI, scratch, Herpes sore)     UTI 8. OTHER SYMPTOMS: "Do you have any other symptoms?" (e.g., flank pain, vaginal discharge, genital sores, urgency, blood in urine)     urgency 9. PREGNANCY: "Is there any chance you are pregnant?" "When was your last menstrual period?"  Protocols used: Urination Pain - Female-A-AH

## 2022-03-24 NOTE — Telephone Encounter (Signed)
I would have to see her. I can't just send in abx, especially given that she has bladder cancer.

## 2022-03-24 NOTE — Telephone Encounter (Signed)
  Chief Complaint: pain with urination Symptoms: pain, urgency Frequency: started yesterday Pertinent Negatives: Patient denies fever Disposition: '[]'$ ED /'[]'$ Urgent Care (no appt availability in office) / '[]'$ Appointment(In office/virtual)/ '[]'$  [place] [facility]/ '[]'$ Home Care/ '[x]'$ Refused Recommended Disposition /'[]'$ [place] [facility]/ '[]'$  Follow-up with PCP Additional Notes: Advised patient since she was treated without being seen last time- she may need appointment- patient states she is unable to do virtual UC appointment because she does not have computer. Patient states she can not go to UC because she does not have a ride. Patient advised I would send her request for medication to PCP- but she may still advise UC visit.

## 2022-03-30 ENCOUNTER — Other Ambulatory Visit: Payer: 59 | Admitting: Urology

## 2022-03-31 ENCOUNTER — Telehealth: Payer: Self-pay | Admitting: Internal Medicine

## 2022-03-31 NOTE — Telephone Encounter (Signed)
Pt is calling to request if Rollene Fare could send a script in for [employer] for her to [facility]. Please advise [phone]

## 2022-04-02 ENCOUNTER — Emergency Department: Payer: Medicare Other

## 2022-04-02 ENCOUNTER — Emergency Department
Admission: EM | Admit: 2022-04-02 | Discharge: 2022-04-03 | Disposition: A | Discharge status: Home / Self Care | Payer: Medicare Other | Attending: Emergency Medicine | Admitting: Emergency Medicine

## 2022-04-02 ENCOUNTER — Encounter: Payer: Self-pay | Admitting: Oncology

## 2022-04-02 ENCOUNTER — Other Ambulatory Visit: Payer: Self-pay

## 2022-04-02 ENCOUNTER — Encounter: Payer: Self-pay | Admitting: Radiology

## 2022-04-02 DIAGNOSIS — R079 Chest pain, unspecified: Secondary | ICD-10-CM

## 2022-04-02 DIAGNOSIS — J449 Chronic obstructive pulmonary disease, unspecified: Secondary | ICD-10-CM | POA: Insufficient documentation

## 2022-04-02 DIAGNOSIS — E1140 Type 2 diabetes mellitus with diabetic neuropathy, unspecified: Secondary | ICD-10-CM | POA: Insufficient documentation

## 2022-04-02 DIAGNOSIS — Z7984 Long term (current) use of oral hypoglycemic drugs: Secondary | ICD-10-CM | POA: Diagnosis not present

## 2022-04-02 DIAGNOSIS — K219 Gastro-esophageal reflux disease without esophagitis: Secondary | ICD-10-CM | POA: Insufficient documentation

## 2022-04-02 DIAGNOSIS — R6889 Other general symptoms and signs: Secondary | ICD-10-CM | POA: Diagnosis not present

## 2022-04-02 DIAGNOSIS — R1013 Epigastric pain: Secondary | ICD-10-CM

## 2022-04-02 DIAGNOSIS — E1169 Type 2 diabetes mellitus with other specified complication: Secondary | ICD-10-CM | POA: Insufficient documentation

## 2022-04-02 DIAGNOSIS — Z7982 Long term (current) use of aspirin: Secondary | ICD-10-CM | POA: Insufficient documentation

## 2022-04-02 DIAGNOSIS — Z79899 Other long term (current) drug therapy: Secondary | ICD-10-CM | POA: Diagnosis not present

## 2022-04-02 DIAGNOSIS — R0789 Other chest pain: Secondary | ICD-10-CM | POA: Diagnosis not present

## 2022-04-02 DIAGNOSIS — Z743 Need for continuous supervision: Secondary | ICD-10-CM | POA: Diagnosis not present

## 2022-04-02 DIAGNOSIS — K824 Cholesterolosis of gallbladder: Secondary | ICD-10-CM | POA: Diagnosis not present

## 2022-04-02 DIAGNOSIS — I10 Essential (primary) hypertension: Secondary | ICD-10-CM | POA: Insufficient documentation

## 2022-04-02 DIAGNOSIS — R0902 Hypoxemia: Secondary | ICD-10-CM | POA: Diagnosis not present

## 2022-04-02 DIAGNOSIS — J45909 Unspecified asthma, uncomplicated: Secondary | ICD-10-CM | POA: Insufficient documentation

## 2022-04-02 DIAGNOSIS — E785 Hyperlipidemia, unspecified: Secondary | ICD-10-CM | POA: Insufficient documentation

## 2022-04-02 LAB — BASIC METABOLIC PANEL
Anion gap: 10 (ref 5–15)
BUN: 13 mg/dL (ref 8–23)
CO2: 26 mmol/L (ref 22–32)
Calcium: 10.1 mg/dL (ref 8.9–10.3)
Chloride: 101 mmol/L (ref 98–111)
Creatinine, Ser: 0.73 mg/dL (ref 0.44–1.00)
GFR, Estimated: >60 mL/min (ref >60)
Glucose, Bld: 101 mg/dL — ABNORMAL HIGH (ref 70–99)
Potassium: 3.7 mmol/L (ref 3.5–5.1)
Sodium: 137 mmol/L (ref 135–145)

## 2022-04-02 LAB — CBC
HCT: 36.1 % (ref 36.0–46.0)
Hemoglobin: 11.2 g/dL — ABNORMAL LOW (ref 12.0–15.0)
MCH: 29.6 pg (ref 26.0–34.0)
MCHC: 31 g/dL (ref 30.0–36.0)
MCV: 95.5 fL (ref 80.0–100.0)
Platelets: 277 10*3/uL (ref 150–400)
RBC: 3.78 MIL/uL — ABNORMAL LOW (ref 3.87–5.11)
RDW: 12.9 % (ref 11.5–15.5)
WBC: 5.3 10*3/uL (ref 4.0–10.5)
nRBC: 0 % (ref 0.0–0.2)

## 2022-04-02 LAB — TROPONIN I (HIGH SENSITIVITY): Troponin I (High Sensitivity): 4 ng/L (ref <18)

## 2022-04-02 MED ORDER — FAMOTIDINE IN NACL 20-0.9 MG/50ML-% IV SOLN
20.0000 mg | Freq: Once | INTRAVENOUS | Status: AC
  Administered 2022-04-03: 20 mg via INTRAVENOUS
  Filled 2022-04-02: qty 50

## 2022-04-02 NOTE — ED Provider Notes (Signed)
[facility] Provider Note    Event Date/Time   First MD Initiated Contact with Patient 04/02/22 2356     (approximate)   History   Chest Pain   HPI  Kendra [name] is a 76 y.o. female brought to the ED via EMS from home with a chief complaint of substernal chest/epigastric pain radiated to her back.  Onset approximately 5 PM after eating chicken noodle soup.  Denies associated diaphoresis, shortness of breath, nausea/vomiting, palpitations or dizziness.  Denies recent fever or cough.     Past Medical History   Past Medical History:  Diagnosis Date  . Asthma   . Back pain   . Cataract   . Cervical disc disorder   . COPD (chronic obstructive pulmonary disease) ([place])   . Depression   . Diabetes ([place])    type II  . Diabetic neuropathy ([place])   . Dyspnea    with exertion  . GERD (gastroesophageal reflux disease)   . History of kidney stones    per patient "can't remember the year"  . Hyperlipidemia   . Hypertension    per patient "take meds for it"  . Insomnia   . Neuropathy   . Osteoarthritis   . Osteopenia   . Pneumonia 2018   per patient  . Reflux   . Rheumatoid arthritis ([place])   . Stroke ([place]) 2015   and again 2017  - Weakness in left leg, staggers w/ walking, vision   . Tenosynovitis      Active Problem List   Patient Active Problem List   Diagnosis Date Noted  . Aortic atherosclerosis ([place]) 05/31/2021  . COPD (chronic obstructive pulmonary disease) ([place]) 05/31/2021  . Anemia 05/31/2021  . Hemiparesis affecting left side as late effect of stroke ([place]) 02/02/2021  . Recurrent major depressive disorder, in partial remission ([place]) 02/02/2021  . Carotid artery stenosis 12/30/2019  . Hypertension associated with diabetes ([place]) 12/19/2018  . Diabetes mellitus type 2, controlled, with complications ([place]) 16/06/9603  . Insomnia 03/30/2015  . Hyperlipidemia associated with type 2 diabetes mellitus ([place]) 03/30/2015  .  Gastro-esophageal reflux disease without esophagitis 03/30/2015  . Osteoarthritis 03/30/2015  . Osteopenia 03/30/2015  . B12 deficiency 03/30/2015     Past Surgical History   Past Surgical History:  Procedure Laterality Date  . BREAST BIOPSY Right    Fatty Tumor  . ENDARTERECTOMY Right 10/21/2019   Procedure: ENDARTERECTOMY CAROTID RIGHT;  Surgeon: [name], MD;  Location: [facility];  Service: Vascular;  Laterality: Right;  . ENDARTERECTOMY Left 12/30/2019   Procedure: LEFT CAROTID ENDARTERECTOMY;  Surgeon: [name], MD;  Location: [facility];  Service: Vascular;  Laterality: Left;  . NECK SURGERY    . OVARIAN CYST REMOVAL    . PATCH ANGIOPLASTY Right 10/21/2019   Procedure: Patch Angioplasty Using XenoSure Biologic Patch Right Carotid;  Surgeon: [name], MD;  Location: [facility];  Service: Vascular;  Laterality: Right;  . PATCH ANGIOPLASTY Left 12/30/2019   Procedure: Patch Angioplasty Left Carotid;  Surgeon: [name], MD;  Location: [facility];  Service: Vascular;  Laterality: Left;     Home Medications   Prior to Admission medications   Medication Sig Start Date End Date Taking? Authorizing Provider  ACCU-CHEK GUIDE test strip USE AS DIRECTED. UP TO 4 TIMES DAILY ONCE DAILY 03/15/22   [name], NP  Accu-Chek Softclix Lancets lancets USE AS DIRECTED. UP TO 4 TIMES A DAY 03/15/22  [name], NP  Acetaminophen (TYLENOL 8 HOUR ARTHRITIS PAIN PO) Take 2 capsules by mouth in the morning, at noon, in the evening, and at bedtime.    [provider]  albuterol (VENTOLIN HFA) 108 (90 Base) MCG/ACT inhaler INHALE 1-2 PUFFS BY MOUTH EVERY 6 HOURS AS NEEDED WHEEZING/ SHORTNESS OF BREATH 12/19/21   [name], NP  aspirin EC 81 MG tablet Take 1 tablet (81 mg total) by mouth daily. Swallow whole. 07/18/21   [name], NP  atorvastatin (LIPITOR) 40 MG tablet TAKE 1 TABLET BY MOUTH BEDTIME 01/12/22   [name], NP   blood glucose meter kit and supplies KIT Dispense based on patient and insurance preference. Use up to four times daily as directed. 08/08/21   [name], NP  Blood Glucose Monitoring Suppl [name] VERIO) w/Device KIT USE AS DIRECTED 04/05/20   Malfi, [name], FNP  buPROPion (WELLBUTRIN XL) 150 MG 24 hr tablet TAKE 1 TABLET BY MOUTH ONCE DAILY 01/12/22   [name], NP  clopidogrel (PLAVIX) 75 MG tablet TAKE 1 TABLET BY MOUTH ONCE DAILY 02/08/22   [name], NP  cyclobenzaprine (FLEXERIL) 5 MG tablet Take 1 tablet (5 mg total) by mouth 3 (three) times daily as needed for muscle spasms. 07/18/21   [name], NP  docusate sodium (COLACE) 100 MG capsule Take 1 capsule (100 mg total) by mouth 2 (two) times daily as needed for mild constipation. 06/20/21   [name], NP  Lancet Devices (ONE TOUCH DELICA LANCING DEV) MISC 1 Device by Does not apply route 2 (two) times daily. 01/02/20   Malfi, [name], FNP  metFORMIN (GLUCOPHAGE) 500 MG tablet TAKE 1 TABLET BY MOUTH TWICE DAILY WITH A MEAL 12/14/21   [name], NP  traZODone (DESYREL) 50 MG tablet Take 0.5-1 tablets (25-50 mg total) by mouth at bedtime as needed for sleep. 12/10/21   [name], NP  TRELEGY ELLIPTA 100-62.5-25 MCG/ACT AEPB USE ONE INHALATION INTO THE LUNGS ONCE A DAY RINSE MOUTH AFTER EACH USE 12/19/21   [name], NP     Allergies  Citalopram   Family History   Family History  Problem Relation Age of Onset  . Stroke Father   . Depression Father   . Diabetes Mother   . Hyperlipidemia Mother   . Breast cancer Mother   . Heart murmur Son   . Heart murmur Son   . Heart murmur Son      Physical Exam  Triage Vital Signs: ED Triage Vitals  Enc Vitals Group     BP 04/02/22 1933 (!) 150/91     Pulse Rate 04/02/22 1933 75     Resp 04/02/22 1933 18     Temp 04/02/22 1933 99.3 F (37.4 C)     Temp Source 04/02/22 1933 Oral     SpO2 04/02/22 1933 96 %     Weight 04/02/22 1945 112 lb  (50.8 kg)     Height 04/02/22 1945 $RemoveBefor'5\' 4"'fSMRPPbvPxBo$  (1.626 m)     Head Circumference --      Peak Flow --      Pain Score 04/02/22 1944 9     Pain Loc --      Pain Edu? --      Excl. in [place]? --     Updated Vital Signs: BP (!) 162/98   Pulse 84   Temp 99.3 F (37.4 C) (Oral)   Resp 15  Ht '5\' 4"'$  (1.626 m)   Wt 50.8 kg   SpO2 99%   BMI 19.22 kg/m    General: Awake, no distress.  CV:  RRR.  Good peripheral perfusion.  Resp:  Normal effort.  CTA B. Abd:  Mildly tender to epigastrium without rebound or guarding.  No distention.  Other:  Bilateral calves are nonswollen and nontender.   ED Results / Procedures / Treatments  Labs (all labs ordered are listed, but only abnormal results are displayed) Labs Reviewed  BASIC METABOLIC PANEL - Abnormal; Notable for the following components:      Result Value   Glucose, Bld 101 (*)    All other components within normal limits  CBC - Abnormal; Notable for the following components:   RBC 3.78 (*)    Hemoglobin 11.2 (*)    All other components within normal limits  HEPATIC FUNCTION PANEL  LIPASE, BLOOD  TROPONIN I (HIGH SENSITIVITY)  TROPONIN I (HIGH SENSITIVITY)     EKG  ED ECG REPORT I, Isa Hitz J, the attending physician, personally viewed and interpreted this ECG.   Date: 04/03/2022  EKG Time: 1937  Rate: 80  Rhythm: normal sinus rhythm  Axis: Normal  Intervals:none  ST&T Change: Nonspecific    RADIOLOGY Have independently visualized and interpreted patient's chest x-ray and ultrasound as well as noted the radiology interpretation:  Chest x-ray: No acute cardiopulmonary process  Ultrasound: Unremarkable  Official radiology report(s): US ABDOMEN LIMITED RUQ (LIVER/GB)  Result Date: 04/03/2022 CLINICAL DATA:  epigastric pain after eating soup EXAM: ULTRASOUND ABDOMEN LIMITED RIGHT UPPER QUADRANT COMPARISON:  CT 02/08/2022, abdominal ultrasound 06/16/2015. FINDINGS: Gallbladder: Physiologically distended. No gallstones  or wall thickening visualized. Previous gallbladder polyp is not seen. No sonographic Murphy sign noted by sonographer. Common bile duct: Diameter: 8 mm, mildly prominent but stable from prior CT. No choledocholithiasis. Liver: No focal lesion identified. Within normal limits in parenchymal echogenicity. No intrahepatic biliary ductal dilatation. Portal vein is patent on color Doppler imaging with normal direction of blood flow towards the liver. Other: No right upper quadrant ascites. IMPRESSION: 1. Normal sonographic appearance of the gallbladder. 2. Prominent common bile duct at 8 mm, similar to recent CT. This is mildly prominent but likely normal for age. 3. Normal sonographic appearance of the liver. Electronically Signed   By: Keith Rake M.D.   On: 04/03/2022 00:30   DG Chest 2 View  Result Date: 04/02/2022 CLINICAL DATA:  Chest pain. EXAM: CHEST - 2 VIEW COMPARISON:  Radiograph dated 10/06/2021. FINDINGS: No focal consolidation, pleural effusion or pneumothorax. The cardiac silhouette is within limits. No acute osseous pathology. Degenerative changes of the spine. IMPRESSION: No active cardiopulmonary disease. Electronically Signed   By: Anner Crete M.D.   On: 04/02/2022 20:16     PROCEDURES:  Critical Care performed: No  .1-3 Lead EKG Interpretation  Performed by: Paulette Blanch, MD Authorized by: Paulette Blanch, MD     Interpretation: normal     ECG rate:  80   ECG rate assessment: normal     Rhythm: sinus rhythm     Ectopy: none     Conduction: normal   Comments:     Patient placed on cardiac monitor to evaluate for arrhythmias    MEDICATIONS ORDERED IN ED: Medications  famotidine (PEPCID) IVPB 20 mg premix (20 mg Intravenous New Bag/Given 04/03/22 0029)     IMPRESSION / MDM / [place] / ED COURSE  I reviewed the triage vital signs and  the nursing notes.                             76 year old female presenting with substernal chest/epigastric pain.  Differential diagnosis includes, but is not limited to, ACS, aortic dissection, pulmonary embolism, cardiac tamponade, pneumothorax, pneumonia, pericarditis, myocarditis, GI-related causes including esophagitis/gastritis, and musculoskeletal chest wall pain.   I have personally reviewed patient's records and see that she last had an oncology office visit for B12 deficiency on 02/20/2022.  Patient's presentation is most consistent with acute complicated illness / injury requiring diagnostic workup.  The patient is on the cardiac monitor to evaluate for evidence of arrhythmia and/or significant heart rate changes.  Laboratory results demonstrate normal WBC 5.3, normal electrolytes including LFTs/lipase, initial negative troponin.  Chest x-ray is unremarkable.  Will administer IV Pepcid, repeat troponin and obtain right upper quadrant abdominal ultrasound.  Will reassess.  Clinical Course as of 04/03/22 0109  Mon Apr 03, 2022  0108 Updated patient on repeat negative troponin and unremarkable ultrasound results.  She is feeling significantly better and eager for discharge home.  Will discharge home on Pepcid and patient will follow-up closely with her PCP.  Strict return precautions given.  Patient verbalizes understanding and agrees with plan of care. [JS]    Clinical Course User Index [JS] Paulette Blanch, MD     FINAL CLINICAL IMPRESSION(S) / ED DIAGNOSES   Final diagnoses:  Nonspecific chest pain  Epigastric pain  Gastroesophageal reflux disease, unspecified whether esophagitis present     Rx / DC Orders   ED Discharge Orders     None        Note:  This document was prepared using Dragon voice recognition software and may include unintentional dictation errors.   Paulette Blanch, MD 04/03/22 [phone]

## 2022-04-02 NOTE — ED Triage Notes (Signed)
Arrived via EMS from home reporting midsternal chest pressure that radiates to back since last night after eating soup. Denies shortness of breath. Took 324 aspirin PTA. 20 G left AC. AOX4. Resp even, unlabored on RA.

## 2022-04-02 NOTE — ED Notes (Signed)
Patient to waiting room via wheelchair by EMS from home.  Per EMS patient complains of midsternal chest pain since last pm that got worse today.  EMS interventions -- ASA '324mg'$ , saline loc via 20 g angiocath to left antecub, hr 80's, bp 180.96, pulse oxi 100% on room air, cbg 177.

## 2022-04-03 ENCOUNTER — Emergency Department: Payer: Medicare Other

## 2022-04-03 DIAGNOSIS — R1013 Epigastric pain: Secondary | ICD-10-CM | POA: Diagnosis not present

## 2022-04-03 DIAGNOSIS — K824 Cholesterolosis of gallbladder: Secondary | ICD-10-CM | POA: Diagnosis not present

## 2022-04-03 DIAGNOSIS — K219 Gastro-esophageal reflux disease without esophagitis: Secondary | ICD-10-CM | POA: Diagnosis not present

## 2022-04-03 LAB — HEPATIC FUNCTION PANEL
ALT: 16 U/L (ref 0–44)
AST: 21 U/L (ref 15–41)
Albumin: 4.3 g/dL (ref 3.5–5.0)
Alkaline Phosphatase: 62 U/L (ref 38–126)
Bilirubin, Direct: <0.1 mg/dL (ref 0.0–0.2)
Total Bilirubin: 0.5 mg/dL (ref 0.3–1.2)
Total Protein: 7.6 g/dL (ref 6.5–8.1)

## 2022-04-03 LAB — LIPASE, BLOOD: Lipase: 23 U/L (ref 11–51)

## 2022-04-03 LAB — TROPONIN I (HIGH SENSITIVITY): Troponin I (High Sensitivity): 5 ng/L (ref <18)

## 2022-04-03 MED ORDER — FAMOTIDINE 20 MG PO TABS: 20.0000 mg | ORAL_TABLET | Freq: Two times a day (BID) | ORAL | 0 refills | Status: DC

## 2022-04-03 NOTE — Discharge Instructions (Signed)
Start Pepcid 20 mg twice daily.  Return to the ER for worsening symptoms, persistent vomiting, difficulty breathing or other concerns.

## 2022-04-05 ENCOUNTER — Telehealth: Payer: Medicare Other

## 2022-04-05 ENCOUNTER — Telehealth: Payer: Self-pay | Admitting: Pharmacist

## 2022-04-05 NOTE — Telephone Encounter (Signed)
  Chronic Care Management   Outreach Note  04/05/2022 Name: Kendra [name] MRN: [id number] DOB: [birth year]  Referred by: [name], NP Reason for referral : No chief complaint on file.   Was unable to reach patient via telephone today and have left HIPAA compliant voicemail asking patient to return my call.    Follow Up Plan: CM Pharmacist will attempt to reach patient by telephone again within the next 30 days  Wallace Cullens, PharmD, [facility] [phone]

## 2022-04-07 ENCOUNTER — Ambulatory Visit: Payer: Self-pay

## 2022-04-07 NOTE — Telephone Encounter (Signed)
She has active bladder cancer.  Would recommend she follow-up with her oncologist.  If she is unable to get transport then ED may be necessary.

## 2022-04-07 NOTE — Telephone Encounter (Signed)
Chief Complaint: Vaginal bleeding and weakness Symptoms: staggers when walks Frequency: now and in June Pertinent Negatives: Patient denies fever, sob Disposition: '[x]'$ ED /'[]'$ Urgent Care (no appt availability in office) / '[]'$ Appointment(In office/virtual)/ '[]'$  [facility]/ '[]'$ Home Care/ '[]'$ Refused Recommended Disposition /'[]'$ [facility]/ '[]'$  Follow-up with PCP Additional Notes: Pt called because she is having post menopausal vaginal bleeding. Upon finding an appt for Tuesday, pt stated that she is not sure she will be able to make that appt because she is so weak. Pt states that she staggers when she walks is very weak and has no transportation. Pt will call EMS. PT states she went to ED for vaginal bleeding in June, but there is not record in her chart. Reason for Disposition  [1] SEVERE weakness (i.e., unable to walk or barely able to walk, requires support) AND [2] new-onset or worsening  Answer Assessment - Initial Assessment Questions 1. AMOUNT: "Describe the bleeding that you are having."    - SPOTTING: spotting, or pinkish / brownish mucous discharge; does not fill panty liner or pad    - MILD:  less than 1 pad / hour; less than patient's usual menstrual bleeding   - MODERATE: 1-2 pads / hour; 1 menstrual cup every 6 hours; small-medium blood clots (e.g., pea, grape, small coin)   - SEVERE: soaking 2 or more pads/hour for 2 or more hours; 1 menstrual cup every 2 hours; bleeding not contained by pads or continuous red blood from vagina; large blood clots (e.g., golf ball, large coin)      Looks like a period - light  2. ONSET: "When did the bleeding begin?" "Is it continuing now?"     In June, then stopped, and came back 3 days ago. 3. MENSTRUAL PERIOD: "When was the last normal menstrual period?" "How is this different than your period?"     Post menopausal 4. REGULARITY: "How regular are your periods?"     na 5. ABDOMEN PAIN: "Do you have any pain?" "How bad is the  pain?"  (e.g., Scale 1-10; mild, moderate, or severe)   - MILD (1-3): doesn't interfere with normal activities, abdomen soft and not tender to touch    - MODERATE (4-7): interferes with normal activities or awakens from sleep, abdomen tender to touch    - SEVERE (8-10): excruciating pain, doubled over, unable to do any normal activities      Last week on right side. Cramps 6. PREGNANCY: "Is there any chance you are pregnant?" "When was your last menstrual period?"     no 7. BREASTFEEDING: "Are you breastfeeding?"     na 8. HORMONE MEDICINES: "Are you taking any hormone medicines, prescription or over-the-counter?" (e.g., birth control pills, estrogen)     no 9. BLOOD THINNER MEDICINES: "Do you take any blood thinners?" (e.g., Coumadin / warfarin, Pradaxa / dabigatran, aspirin)     yes 10. CAUSE: "What do you think is causing the bleeding?" (e.g., recent gyn surgery, recent gyn procedure; known bleeding disorder, cervical cancer, polycystic ovarian disease, fibroids)         none 11. HEMODYNAMIC STATUS: "Are you weak or feeling lightheaded?" If Yes, ask: "Can you stand and walk normally?"        No - feels a little weak 12. OTHER SYMPTOMS: "What other symptoms are you having with the bleeding?" (e.g., passed tissue, vaginal discharge, fever, menstrual-type cramps)       Cramps last week  Answer Assessment - Initial Assessment Questions 1. DESCRIPTION: "Describe  how you are feeling."     staggering 2. SEVERITY: "How bad is it?"  "Can you stand and walk?"   - MILD (0-3): Feels weak or tired, but does not interfere with work, school or normal activities.   - MODERATE (4-7): Able to stand and walk; weakness interferes with work, school, or normal activities.   - SEVERE (8-10): Unable to stand or walk; unable to do usual activities.     Moderate 3. ONSET: "When did these symptoms begin?" (e.g., hours, days, weeks, months)     When started bleeding 4. CAUSE: "What do you think is causing the  weakness or fatigue?" (e.g., not drinking enough fluids, medical problem, trouble sleeping)     Blood loss 5. NEW MEDICINES:  "Have you started on any new medicines recently?" (e.g., opioid pain medicines, benzodiazepines, muscle relaxants, antidepressants, antihistamines, neuroleptics, beta blockers)      6. OTHER SYMPTOMS: "Do you have any other symptoms?" (e.g., chest pain, fever, cough, SOB, vomiting, diarrhea, bleeding, other areas of pain)      7. PREGNANCY: "Is there any chance you are pregnant?" "When was your last menstrual period?"     na  Protocols used: Vaginal Bleeding - Abnormal-A-AH, Weakness (Generalized) and Fatigue-A-AH

## 2022-04-12 ENCOUNTER — Other Ambulatory Visit: Payer: Self-pay | Admitting: Internal Medicine

## 2022-04-12 ENCOUNTER — Other Ambulatory Visit: Payer: 59 | Admitting: Urology

## 2022-04-12 DIAGNOSIS — G4700 Insomnia, unspecified: Secondary | ICD-10-CM

## 2022-04-12 DIAGNOSIS — E785 Hyperlipidemia, unspecified: Secondary | ICD-10-CM

## 2022-04-13 NOTE — Telephone Encounter (Signed)
Requested Prescriptions  Pending Prescriptions Disp Refills  . atorvastatin (LIPITOR) 40 MG tablet [Pharmacy Med Name: ATORVASTATIN CALCIUM 40 MG TAB] 90 tablet 0    Sig: TAKE 1 TABLET BY MOUTH BEDTIME     Cardiovascular:  Antilipid - Statins Failed - 04/12/2022 12:03 PM      Failed - Lipid Panel in normal range within the last 12 months    Cholesterol, Total  Date Value Ref Range Status  06/07/2015 177 100 - 199 mg/dL Final   Cholesterol  Date Value Ref Range Status  08/01/2021 135 <200 mg/dL Final  09/02/2014 141 0 - 200 mg/dL Final   Ldl Cholesterol, Calc  Date Value Ref Range Status  09/02/2014 73 0 - 100 mg/dL Final   LDL Cholesterol (Calc)  Date Value Ref Range Status  08/01/2021 53 mg/dL (calc) Final    Comment:    Reference range: <100 . Desirable range <100 mg/dL for primary prevention;   <70 mg/dL for patients with CHD or diabetic patients  with > or = 2 CHD risk factors. [name] LDL-C is now calculated using the Martin-Hopkins  calculation, which is a validated novel method providing  better accuracy than the Friedewald equation in the  estimation of LDL-C.  Cresenciano Genre et al. Annamaria Helling. 6222;979(89): 2061-2068  (http://education.[facility].com/faq/FAQ164)    HDL Cholesterol  Date Value Ref Range Status  09/02/2014 37 (L) 40 - 60 mg/dL Final   HDL  Date Value Ref Range Status  08/01/2021 65 > OR = 50 mg/dL Final  06/07/2015 56 >39 mg/dL Final    Comment:    According to ATP-III Guidelines, HDL-C >59 mg/dL is considered a negative risk factor for CHD.    Triglycerides  Date Value Ref Range Status  08/01/2021 89 <150 mg/dL Final  09/02/2014 155 0 - 200 mg/dL Final         Passed - Patient is not pregnant      Passed - Valid encounter within last 12 months    Recent Outpatient Visits          6 months ago Nonspecific chest pain   [facility] [place], [place] W, NP   6 months ago Chest pain, unspecified type   [facility]  [place], Coralie Keens, NP   8 months ago Controlled type 2 diabetes mellitus with complication, without long-term current use of insulin ([place])   [facility], NP   9 months ago Chronic right shoulder pain   [facility] [place], Coralie Keens, NP   10 months ago Aortic atherosclerosis [name])   [facility], [place] W, NP             . buPROPion (WELLBUTRIN XL) 150 MG 24 hr tablet [Pharmacy Med Name: BUPROPION HCL ER (XL) 150 MG TAB] 90 tablet 0    Sig: TAKE 1 TABLET BY MOUTH ONCE DAILY     Psychiatry: Antidepressants - bupropion Failed - 04/12/2022 12:03 PM      Failed - Last BP in normal range    BP Readings from Last 1 Encounters:  04/03/22 (!) 161/96         Passed - Cr in normal range and within 360 days    Creat  Date Value Ref Range Status  08/01/2021 0.85 0.60 - 1.00 mg/dL Final   Creatinine, Ser  Date Value Ref Range Status  04/02/2022 0.73 0.44 - 1.00 mg/dL Final   Creatinine, Urine  Date Value  Ref Range Status  05/31/2021 89 20 - 275 mg/dL Final         Passed - AST in normal range and within 360 days    AST  Date Value Ref Range Status  04/02/2022 21 15 - 41 U/L Final   SGOT(AST)  Date Value Ref Range Status  09/01/2014 20 15 - 37 Unit/L Final         Passed - ALT in normal range and within 360 days    ALT  Date Value Ref Range Status  04/02/2022 16 0 - 44 U/L Final   SGPT (ALT)  Date Value Ref Range Status  09/01/2014 24 U/L Final    Comment:    14-63 NOTE: New Reference Range 04/07/14          Passed - Completed PHQ-2 or PHQ-9 in the last 360 days      Passed - Valid encounter within last 6 months    Recent Outpatient Visits          6 months ago Nonspecific chest pain   [name] [place], [place], NP   6 months ago Chest pain, unspecified type   [facility] [place], Coralie Keens, NP   8 months ago Controlled type 2 diabetes mellitus with complication,  without long-term current use of insulin ([place])   [facility] [place], Coralie Keens, NP   9 months ago Chronic right shoulder pain   [facility] [place], Coralie Keens, NP   10 months ago Aortic atherosclerosis [facility])   [facility] [place], [place] W, NP             . traZODone (DESYREL) 50 MG tablet [Pharmacy Med Name: TRAZODONE HCL 50 MG TAB] 90 tablet 0    Sig: TAKE 1/2 TO 1 TABLET BY MOUTH AT [place]     Psychiatry: Antidepressants - Serotonin Modulator Passed - 04/12/2022 12:03 PM      Passed - Completed PHQ-2 or PHQ-9 in the last 360 days      Passed - Valid encounter within last 6 months    Recent Outpatient Visits          6 months ago Nonspecific chest pain   [name] [place], [place], NP   6 months ago Chest pain, unspecified type   [facility] [place], Coralie Keens, NP   8 months ago Controlled type 2 diabetes mellitus with complication, without long-term current use of insulin ([place])   [facility] [place], Coralie Keens, NP   9 months ago Chronic right shoulder pain   [facility] [place], [place] W, NP   10 months ago Aortic atherosclerosis [facility])   [facility] [place], [place] W, NP             . metFORMIN (GLUCOPHAGE) 500 MG tablet [Pharmacy Med Name: METFORMIN HCL 500 MG TAB] 180 tablet 0    Sig: TAKE 1 TABLET BY MOUTH TWICE DAILY WITH A MEAL     Endocrinology:  Diabetes - Biguanides Failed - 04/12/2022 12:03 PM      Failed - HBA1C is between 0 and 7.9 and within 180 days    Hemoglobin A1C  Date Value Ref Range Status  09/02/2014 9.5 (H) 4.2 - 6.3 % Final    Comment:    The American Diabetes Association recommends that a primary goal of therapy should be <7% and that physicians should  reevaluate the treatment regimen in patients with HbA1c values consistently >8%.    Hgb A1c MFr Bld  Date Value Ref Range Status  08/01/2021 7.7 (H) <5.7 % of  total Hgb Final    Comment:    For someone without known diabetes, a hemoglobin A1c value of 6.5% or greater indicates that they may have  diabetes and this should be confirmed with a follow-up  test. . For someone with known diabetes, a value <7% indicates  that their diabetes is well controlled and a value  greater than or equal to 7% indicates suboptimal  control. A1c targets should be individualized based on  duration of diabetes, age, comorbid conditions, and  other considerations. . Currently, no consensus exists regarding use of hemoglobin A1c for diagnosis of diabetes for children. .          Passed - Cr in normal range and within 360 days    Creat  Date Value Ref Range Status  08/01/2021 0.85 0.60 - 1.00 mg/dL Final   Creatinine, Ser  Date Value Ref Range Status  04/02/2022 0.73 0.44 - 1.00 mg/dL Final   Creatinine, Urine  Date Value Ref Range Status  05/31/2021 89 20 - 275 mg/dL Final         Passed - eGFR in normal range and within 360 days    GFR, Est African American  Date Value Ref Range Status  06/14/2020 82 > OR = 60 mL/min/1.27m Final   GFR, Est Non African American  Date Value Ref Range Status  06/14/2020 70 > OR = 60 mL/min/1.777mFinal   GFR, Estimated  Date Value Ref Range Status  04/02/2022 >60 >60 mL/min Final    Comment:    (NOTE) Calculated using the CKD-EPI Creatinine Equation (2021)    eGFR  Date Value Ref Range Status  08/01/2021 72 > OR = 60 mL/min/1.7335minal    Comment:    The eGFR is based on the CKD-EPI 2021 equation. To calculate  the new eGFR from a previous Creatinine or Cystatin C result, go to https://www.kidney.org/professionals/ kdoqi/gfr%5Fcalculator          Passed - B12 Level in normal range and within 720 days    Vitamin B-12  Date Value Ref Range Status  01/24/2022 378 180 - 914 pg/mL Final    Comment:    (NOTE) This assay is not validated for testing neonatal or myeloproliferative syndrome specimens  for Vitamin B12 levels. Performed at [facility] [id number]       Passed - Valid encounter within last 6 months    Recent Outpatient Visits          6 months ago Nonspecific chest pain   [facility]   6 months ago Chest pain, unspecified type   [facility]   8 months ago Controlled type 2 diabetes mellitus with complication, without long-term current use of insulin ([name]   9 months ago Chronic right shoulder pain   [facility]   10 months ago Aortic atherosclerosis ([facility] [place]             Passed - CBC within normal limits and completed in the last 12 months    WBC  Date Value Ref  Range Status  04/02/2022 5.3 4.0 - 10.5 K/uL Final   RBC  Date Value Ref Range Status  04/02/2022 3.78 (L) 3.87 - 5.11 MIL/uL Final   Hemoglobin  Date Value Ref Range Status  04/02/2022 11.2 (L) 12.0 - 15.0 g/dL Final   HGB  Date Value Ref Range Status  09/01/2014 12.7 12.0 - 16.0 g/dL Final   HCT  Date Value Ref Range Status  04/02/2022 36.1 36.0 - 46.0 % Final  09/01/2014 37.9 35.0 - 47.0 % Final   MCHC  Date Value Ref Range Status  04/02/2022 31.0 30.0 - 36.0 g/dL Final   [facility]  Date Value Ref Range Status  04/02/2022 29.6 26.0 - 34.0 pg Final   MCV  Date Value Ref Range Status  04/02/2022 95.5 80.0 - 100.0 fL Final  09/01/2014 92 80 - 100 fL Final   No results found for: "PLTCOUNTKUC", "LABPLAT", "POCPLA" RDW  Date Value Ref Range Status  04/02/2022 12.9 11.5 - 15.5 % Final  09/01/2014 13.9 11.5 - 14.5 % Final

## 2022-04-19 ENCOUNTER — Other Ambulatory Visit: Payer: Self-pay | Admitting: Internal Medicine

## 2022-04-19 DIAGNOSIS — J432 Centrilobular emphysema: Secondary | ICD-10-CM

## 2022-04-19 DIAGNOSIS — G4700 Insomnia, unspecified: Secondary | ICD-10-CM

## 2022-04-20 NOTE — Telephone Encounter (Signed)
Requested medication (s) are due for refill today: yes  Requested medication (s) are on the active medication list: yes  Last refill:  Trellegy 12/19/21 #60 1 RF              cyclobenzaprine: 07/18/21 #30 3 RF  Future visit scheduled: no  Notes to clinic:  Trellegy not assigned a protocol Cyclobenzaprine not delegated to NT to refill   Requested Prescriptions  Pending Prescriptions Disp Refills   TRELEGY ELLIPTA 100-62.5-25 MCG/ACT AEPB [Pharmacy Med Name: TRELEGY ELLIPTA 100-62.5-25 MCG/ACT] 60 each     Sig: USE ONE INHALATION INTO THE LUNGS ONCE A DAY RINSE MOUTH AFTER EACH USE     Off-Protocol Failed - 04/19/2022  5:19 PM      Failed - Medication not assigned to a protocol, review manually.      Passed - Valid encounter within last 12 months    Recent Outpatient Visits           6 months ago Nonspecific chest pain   [facility], [place], [place]   6 months ago Chest pain, unspecified type   [facility] [place], Coralie Keens, NP   8 months ago Controlled type 2 diabetes mellitus with complication, without long-term current use of insulin [facility])   [facility], [facility]   10 months ago Chronic right shoulder pain   [facility] [place], Coralie Keens, NP   10 months ago Aortic atherosclerosis [name])   [facility] [place], Coralie Keens, NP               cyclobenzaprine (FLEXERIL) 5 MG tablet [Pharmacy Med Name: CYCLOBENZAPRINE HCL 5 MG TAB] 30 tablet     Sig: TAKE 1 TABLET BY MOUTH 3 TIMES DAILY AS NEEDED FOR MUSCLE SPASMS     Not Delegated - Analgesics:  Muscle Relaxants Failed - 04/19/2022  5:19 PM      Failed - This refill cannot be delegated      Passed - Valid encounter within last 6 months    Recent Outpatient Visits           6 months ago Nonspecific chest pain   [facility] [place], Coralie Keens, NP   6 months ago Chest pain, unspecified type   [name] [place],  Coralie Keens, NP   8 months ago Controlled type 2 diabetes mellitus with complication, without long-term current use of insulin [facility])   [facility], Cor[facility] Keens, NP   10 months ago Chronic right shoulder pain   [facility] [place], Coralie Keens, NP   10 months ago Aortic atherosclerosis [facility])   [name] [place], Coralie Keens, NP              Refused Prescriptions Disp Refills   traZODone (DESYREL) 50 MG tablet [Pharmacy Med Name: TRAZODONE HCL 50 MG TAB] 90 tablet     Sig: TAKE 1/2 TO 1 TABLET BY MOUTH AT [place]     Psychiatry: Antidepressants - Serotonin Modulator Passed - 04/19/2022  5:19 PM      Passed - Completed PHQ-2 or PHQ-9 in the last 360 days      Passed - Valid encounter within last 6 months    Recent Outpatient Visits           6 months ago Nonspecific chest pain   [facility] [place], [place]  W, NP   6 months ago Chest pain, unspecified type   [facility] [place], Coralie Keens, NP   8 months ago Controlled type 2 diabetes mellitus with complication, without long-term current use of insulin [facility])   [facility] [place], Coralie Keens, NP   10 months ago Chronic right shoulder pain   [place], Coralie Keens, NP   10 months ago Aortic atherosclerosis [facility])   [facility], Coralie Keens, NP

## 2022-04-20 NOTE — Telephone Encounter (Signed)
Requested Prescriptions  Pending Prescriptions Disp Refills  . traZODone (DESYREL) 50 MG tablet [Pharmacy Med Name: TRAZODONE HCL 50 MG TAB] 90 tablet     Sig: TAKE 1/2 TO 1 TABLET BY MOUTH AT [name] NEEDED SLEEP     Psychiatry: Antidepressants - Serotonin Modulator Passed - 04/19/2022  5:19 PM      Passed - Completed PHQ-2 or PHQ-9 in the last 360 days      Passed - Valid encounter within last 6 months    Recent Outpatient Visits          6 months ago Nonspecific chest pain   [facility] [place], [place], NP   6 months ago Chest pain, unspecified type   [name] [place], Coralie Keens, NP   8 months ago Controlled type 2 diabetes mellitus with complication, without long-term current use of insulin [facility])   [name] [place], Coralie Keens, NP   10 months ago Chronic right shoulder pain   [facility] [place], Coralie Keens, NP   10 months ago Aortic atherosclerosis [facility])   [facility], [place] W, NP             . TRELEGY ELLIPTA 100-62.5-25 MCG/ACT AEPB [Pharmacy Med Name: TRELEGY ELLIPTA 100-62.5-25 MCG/ACT] 60 each     Sig: USE ONE INHALATION INTO THE LUNGS ONCE A DAY RINSE MOUTH AFTER EACH USE     Off-Protocol Failed - 04/19/2022  5:19 PM      Failed - Medication not assigned to a protocol, review manually.      Passed - Valid encounter within last 12 months    Recent Outpatient Visits          6 months ago Nonspecific chest pain   [facility] [place], [place], NP   6 months ago Chest pain, unspecified type   [facility] [place], Coralie Keens, NP   8 months ago Controlled type 2 diabetes mellitus with complication, without long-term current use of insulin [facility])   [name], NP   10 months ago Chronic right shoulder pain   [facility] [place], Coralie Keens, NP   10 months ago Aortic atherosclerosis [facility])   [facility]  [place], Coralie Keens, NP             . cyclobenzaprine (FLEXERIL) 5 MG tablet [Pharmacy Med Name: CYCLOBENZAPRINE HCL 5 MG TAB] 30 tablet     Sig: TAKE 1 TABLET BY MOUTH 3 TIMES DAILY AS NEEDED FOR MUSCLE SPASMS     Not Delegated - Analgesics:  Muscle Relaxants Failed - 04/19/2022  5:19 PM      Failed - This refill cannot be delegated      Passed - Valid encounter within last 6 months    Recent Outpatient Visits          6 months ago Nonspecific chest pain   [facility] [place], Coralie Keens, NP   6 months ago Chest pain, unspecified type   [facility] [place], Coralie Keens, NP   8 months ago Controlled type 2 diabetes mellitus with complication, without long-term current use of insulin [facility])   [facility] [place], Coralie Keens, NP   10 months ago Chronic right shoulder pain   [place], Coralie Keens, NP   10 months ago Aortic atherosclerosis ([place])   [facility] [place], Coralie Keens, NP

## 2022-04-24 ENCOUNTER — Other Ambulatory Visit: Payer: Self-pay | Admitting: Internal Medicine

## 2022-04-24 NOTE — Telephone Encounter (Signed)
Medication Refill - Medication: metFORMIN (GLUCOPHAGE) 500 MG tablet, clopidogrel (PLAVIX) 75 MG tablet  Has the patient contacted their pharmacy? No.   Preferred Pharmacy (with phone number or street name):  [place], [place]. Phone:  [phone]  Fax:  [phone]     Has the patient been seen for an appointment in the last year OR does the patient have an upcoming appointment? Yes.    Patient is completely out of these 2 medications so she needs as soon as possible. Please assist patient further.

## 2022-04-25 MED ORDER — CLOPIDOGREL BISULFATE 75 MG PO TABS: 75.0000 mg | ORAL_TABLET | Freq: Every day | ORAL | 0 refills | Status: DC

## 2022-04-25 NOTE — Telephone Encounter (Signed)
Refilled 04/13/22 # 180. Requested Prescriptions  Signed Prescriptions Disp Refills   clopidogrel (PLAVIX) 75 MG tablet 90 tablet 0    Sig: Take 1 tablet (75 mg total) by mouth daily.     Hematology: Antiplatelets - clopidogrel Failed - 04/24/2022  5:28 PM      Failed - HGB in normal range and within 180 days    Hemoglobin  Date Value Ref Range Status  04/02/2022 11.2 (L) 12.0 - 15.0 g/dL Final   HGB  Date Value Ref Range Status  09/01/2014 12.7 12.0 - 16.0 g/dL Final         Passed - HCT in normal range and within 180 days    HCT  Date Value Ref Range Status  04/02/2022 36.1 36.0 - 46.0 % Final  09/01/2014 37.9 35.0 - 47.0 % Final         Passed - PLT in normal range and within 180 days    Platelets  Date Value Ref Range Status  04/02/2022 277 150 - 400 K/uL Final   Platelet  Date Value Ref Range Status  09/05/2014 260 150 - 440 x10 3/mm 3 Final         Passed - Cr in normal range and within 360 days    Creat  Date Value Ref Range Status  08/01/2021 0.85 0.60 - 1.00 mg/dL Final   Creatinine, Ser  Date Value Ref Range Status  04/02/2022 0.73 0.44 - 1.00 mg/dL Final   Creatinine, Urine  Date Value Ref Range Status  05/31/2021 89 20 - 275 mg/dL Final         Passed - Valid encounter within last 6 months    Recent Outpatient Visits          6 months ago Nonspecific chest pain   [facility] [place], Coralie Keens, NP   6 months ago Chest pain, unspecified type   [facility] [place], Coralie Keens, NP   8 months ago Controlled type 2 diabetes mellitus with complication, without long-term current use of insulin [facility])   [facility] [name], NP   10 months ago Chronic right shoulder pain   [place], Coralie Keens, NP   10 months ago Aortic atherosclerosis [facility])   [facility] [place], Coralie Keens, NP             Refused Prescriptions Disp Refills  . metFORMIN (GLUCOPHAGE) 500 MG tablet  180 tablet 0    Sig: Take 1 tablet (500 mg total) by mouth 2 (two) times daily with a meal.     Endocrinology:  Diabetes - Biguanides Failed - 04/24/2022  5:28 PM      Failed - HBA1C is between 0 and 7.9 and within 180 days    Hemoglobin A1C  Date Value Ref Range Status  09/02/2014 9.5 (H) 4.2 - 6.3 % Final    Comment:    The American Diabetes Association recommends that a primary goal of therapy should be <7% and that physicians should reevaluate the treatment regimen in patients with HbA1c values consistently >8%.    Hgb A1c MFr Bld  Date Value Ref Range Status  08/01/2021 7.7 (H) <5.7 % of total Hgb Final    Comment:    For someone without known diabetes, a hemoglobin A1c value of 6.5% or greater indicates that they may have  diabetes and this should be confirmed with a follow-up  test. . For someone with  known diabetes, a value <7% indicates  that their diabetes is well controlled and a value  greater than or equal to 7% indicates suboptimal  control. A1c targets should be individualized based on  duration of diabetes, age, comorbid conditions, and  other considerations. . Currently, no consensus exists regarding use of hemoglobin A1c for diagnosis of diabetes for children. .          Passed - Cr in normal range and within 360 days    Creat  Date Value Ref Range Status  08/01/2021 0.85 0.60 - 1.00 mg/dL Final   Creatinine, Ser  Date Value Ref Range Status  04/02/2022 0.73 0.44 - 1.00 mg/dL Final   Creatinine, Urine  Date Value Ref Range Status  05/31/2021 89 20 - 275 mg/dL Final         Passed - eGFR in normal range and within 360 days    GFR, Est African American  Date Value Ref Range Status  06/14/2020 82 > OR = 60 mL/min/1.98m2 Final   GFR, Est Non African American  Date Value Ref Range Status  06/14/2020 70 > OR = 60 mL/min/1.54m2 Final   GFR, Estimated  Date Value Ref Range Status  04/02/2022 >60 >60 mL/min Final    Comment:    (NOTE) Calculated  using the CKD-EPI Creatinine Equation (2021)    eGFR  Date Value Ref Range Status  08/01/2021 72 > OR = 60 mL/min/1.33m2 Final    Comment:    The eGFR is based on the CKD-EPI 2021 equation. To calculate  the new eGFR from a previous Creatinine or Cystatin C result, go to https://www.kidney.org/professionals/ kdoqi/gfr%5Fcalculator          Passed - B12 Level in normal range and within 720 days    Vitamin B-12  Date Value Ref Range Status  01/24/2022 378 180 - 914 pg/mL Final    Comment:    (NOTE) This assay is not validated for testing neonatal or myeloproliferative syndrome specimens for Vitamin B12 levels. Performed at [facility], [place] [address] [place], [place] [address]          Passed - Valid encounter within last 6 months    Recent Outpatient Visits          6 months ago Nonspecific chest pain   [facility] [place], [place], NP   6 months ago Chest pain, unspecified type   [facility] [place], Coralie Keens, NP   8 months ago Controlled type 2 diabetes mellitus with complication, without long-term current use of insulin [facility])   [facility], NP   10 months ago Chronic right shoulder pain   [name] [place], Coralie Keens, NP   10 months ago Aortic [facility])   [facility], Coralie Keens, NP             Passed - CBC within normal limits and completed in the last 12 months    WBC  Date Value Ref Range Status  04/02/2022 5.3 4.0 - 10.5 K/uL Final   RBC  Date Value Ref Range Status  04/02/2022 3.78 (L) 3.87 - 5.11 MIL/uL Final   Hemoglobin  Date Value Ref Range Status  04/02/2022 11.2 (L) 12.0 - 15.0 g/dL Final   HGB  Date Value Ref Range Status  09/01/2014 12.7 12.0 - 16.0 g/dL Final   HCT  Date Value Ref Range Status  04/02/2022 36.1 36.0 -  46.0 % Final  09/01/2014 37.9 35.0 - 47.0 % Final   MCHC  Date Value Ref Range Status  04/02/2022 31.0  30.0 - 36.0 g/dL Final   [name]  Date Value Ref Range Status  04/02/2022 29.6 26.0 - 34.0 pg Final   MCV  Date Value Ref Range Status  04/02/2022 95.5 80.0 - 100.0 fL Final  09/01/2014 92 80 - 100 fL Final   No results found for: "PLTCOUNTKUC", "LABPLAT", "POCPLA" RDW  Date Value Ref Range Status  04/02/2022 12.9 11.5 - 15.5 % Final  09/01/2014 13.9 11.5 - 14.5 % Final

## 2022-04-26 ENCOUNTER — Other Ambulatory Visit: Payer: 59 | Admitting: Urology

## 2022-04-26 ENCOUNTER — Encounter: Payer: Self-pay | Admitting: Oncology

## 2022-04-28 ENCOUNTER — Ambulatory Visit: Payer: Medicare Other | Admitting: Pharmacist

## 2022-04-28 ENCOUNTER — Other Ambulatory Visit: Payer: 59 | Admitting: Urology

## 2022-04-28 ENCOUNTER — Other Ambulatory Visit: Payer: Self-pay | Admitting: Internal Medicine

## 2022-04-28 DIAGNOSIS — E118 Type 2 diabetes mellitus with unspecified complications: Secondary | ICD-10-CM

## 2022-04-28 DIAGNOSIS — E785 Hyperlipidemia, unspecified: Secondary | ICD-10-CM

## 2022-04-28 NOTE — Telephone Encounter (Signed)
Metformin duplicate request, others just filled requesting early. Pharm states automated request that gets sent out. Requested Prescriptions  Pending Prescriptions Disp Refills  . atorvastatin (LIPITOR) 40 MG tablet [Pharmacy Med Name: ATORVASTATIN CALCIUM 40 MG TAB] 90 tablet 0    Sig: TAKE 1 TABLET BY MOUTH BEDTIME     Cardiovascular:  Antilipid - Statins Failed - 04/28/2022 11:42 AM      Failed - Lipid Panel in normal range within the last 12 months    Cholesterol, Total  Date Value Ref Range Status  06/07/2015 177 100 - 199 mg/dL Final   Cholesterol  Date Value Ref Range Status  08/01/2021 135 <200 mg/dL Final  09/02/2014 141 0 - 200 mg/dL Final   Ldl Cholesterol, Calc  Date Value Ref Range Status  09/02/2014 73 0 - 100 mg/dL Final   LDL Cholesterol (Calc)  Date Value Ref Range Status  08/01/2021 53 mg/dL (calc) Final    Comment:    Reference range: <100 . Desirable range <100 mg/dL for primary prevention;   <70 mg/dL for patients with CHD or diabetic patients  with > or = 2 CHD risk factors. [name] LDL-C is now calculated using the Martin-Hopkins  calculation, which is a validated novel method providing  better accuracy than the Friedewald equation in the  estimation of LDL-C.  Cresenciano Genre et al. Annamaria Helling. 9371;696(78): 2061-2068  (http://education.[facility].com/faq/FAQ164)    HDL Cholesterol  Date Value Ref Range Status  09/02/2014 37 (L) 40 - 60 mg/dL Final   HDL  Date Value Ref Range Status  08/01/2021 65 > OR = 50 mg/dL Final  06/07/2015 56 >39 mg/dL Final    Comment:    According to ATP-III Guidelines, HDL-C >59 mg/dL is considered a negative risk factor for CHD.    Triglycerides  Date Value Ref Range Status  08/01/2021 89 <150 mg/dL Final  09/02/2014 155 0 - 200 mg/dL Final         Passed - Patient is not pregnant      Passed - Valid encounter within last 12 months    Recent Outpatient Visits          6 months ago Nonspecific chest pain   [facility] [place], [place] W, NP   6 months ago Chest pain, unspecified type   [facility] [place], Coralie Keens, NP   9 months ago Controlled type 2 diabetes mellitus with complication, without long-term current use of insulin [facility])   [facility], Coralie Keens, NP   10 months ago Chronic right shoulder pain   [facility] [place], Coralie Keens, NP   11 months ago Aortic atherosclerosis [facility])   [facility], [place] W, NP             . buPROPion (WELLBUTRIN XL) 150 MG 24 hr tablet [Pharmacy Med Name: BUPROPION HCL ER (XL) 150 MG TAB] 90 tablet 0    Sig: TAKE 1 TABLET BY MOUTH ONCE DAILY     Psychiatry: Antidepressants - bupropion Failed - 04/28/2022 11:42 AM      Failed - Last BP in normal range    BP Readings from Last 1 Encounters:  04/03/22 (!) 161/96         Passed - Cr in normal range and within 360 days    Creat  Date Value Ref Range Status  08/01/2021 0.85 0.60 - 1.00 mg/dL Final   Creatinine, Ser  Date Value Ref Range  Status  04/02/2022 0.73 0.44 - 1.00 mg/dL Final   Creatinine, Urine  Date Value Ref Range Status  05/31/2021 89 20 - 275 mg/dL Final         Passed - AST in normal range and within 360 days    AST  Date Value Ref Range Status  04/02/2022 21 15 - 41 U/L Final   SGOT(AST)  Date Value Ref Range Status  09/01/2014 20 15 - 37 Unit/L Final         Passed - ALT in normal range and within 360 days    ALT  Date Value Ref Range Status  04/02/2022 16 0 - 44 U/L Final   SGPT (ALT)  Date Value Ref Range Status  09/01/2014 24 U/L Final    Comment:    14-63 NOTE: New Reference Range 04/07/14          Passed - Completed PHQ-2 or PHQ-9 in the last 360 days      Passed - Valid encounter within last 6 months    Recent Outpatient Visits          6 months ago Nonspecific chest pain   [facility] [place], Coralie Keens, NP   6 months ago Chest pain, unspecified type    [facility] [place], Coralie Keens, NP   9 months ago Controlled type 2 diabetes mellitus with complication, without long-term current use of insulin [facility])   [facility] [place], Coralie Keens, NP   10 months ago Chronic right shoulder pain   [facility] [place], [place], NP   11 months ago Aortic atherosclerosis [facility])   [name] [place], Coralie Keens, NP             . metFORMIN (GLUCOPHAGE) 500 MG tablet [Pharmacy Med Name: METFORMIN HCL 500 MG TAB] 180 tablet 0    Sig: TAKE 1 TABLET BY MOUTH TWICE DAILY WITH A MEAL     Endocrinology:  Diabetes - Biguanides Failed - 04/28/2022 11:42 AM      Failed - HBA1C is between 0 and 7.9 and within 180 days    Hemoglobin A1C  Date Value Ref Range Status  09/02/2014 9.5 (H) 4.2 - 6.3 % Final    Comment:    The American Diabetes Association recommends that a primary goal of therapy should be <7% and that physicians should reevaluate the treatment regimen in patients with HbA1c values consistently >8%.    Hgb A1c MFr Bld  Date Value Ref Range Status  08/01/2021 7.7 (H) <5.7 % of total Hgb Final    Comment:    For someone without known diabetes, a hemoglobin A1c value of 6.5% or greater indicates that they may have  diabetes and this should be confirmed with a follow-up  test. . For someone with known diabetes, a value <7% indicates  that their diabetes is well controlled and a value  greater than or equal to 7% indicates suboptimal  control. A1c targets should be individualized based on  duration of diabetes, age, comorbid conditions, and  other considerations. . Currently, no consensus exists regarding use of hemoglobin A1c for diagnosis of diabetes for children. .          Passed - Cr in normal range and within 360 days    Creat  Date Value Ref Range Status  08/01/2021 0.85 0.60 - 1.00 mg/dL Final   Creatinine, Ser  Date Value Ref Range Status  04/02/2022 0.73  0.44 - 1.00 mg/dL  Final   Creatinine, Urine  Date Value Ref Range Status  05/31/2021 89 20 - 275 mg/dL Final         Passed - eGFR in normal range and within 360 days    GFR, Est African American  Date Value Ref Range Status  06/14/2020 82 > OR = 60 mL/min/1.22m2 Final   GFR, Est Non African American  Date Value Ref Range Status  06/14/2020 70 > OR = 60 mL/min/1.8m2 Final   GFR, Estimated  Date Value Ref Range Status  04/02/2022 >60 >60 mL/min Final    Comment:    (NOTE) Calculated using the CKD-EPI Creatinine Equation (2021)    eGFR  Date Value Ref Range Status  08/01/2021 72 > OR = 60 mL/min/1.31m2 Final    Comment:    The eGFR is based on the CKD-EPI 2021 equation. To calculate  the new eGFR from a previous Creatinine or Cystatin C result, go to https://www.kidney.org/professionals/ kdoqi/gfr%5Fcalculator          Passed - B12 Level in normal range and within 720 days    Vitamin B-12  Date Value Ref Range Status  01/24/2022 378 180 - 914 pg/mL Final    Comment:    (NOTE) This assay is not validated for testing neonatal or myeloproliferative syndrome specimens for Vitamin B12 levels. Performed at [facility], [place] [address] [place],  [address]          Passed - Valid encounter within last 6 months    Recent Outpatient Visits          6 months ago Nonspecific chest pain   [facility] [place], [place], NP   6 months ago Chest pain, unspecified type   [facility] [place], Coralie Keens, NP   9 months ago Controlled type 2 diabetes mellitus with complication, without long-term current use of insulin ([place])   [facility] [place], Coralie Keens, NP   10 months ago Chronic right shoulder pain   [facility] [place], Coralie Keens, NP   11 months ago Aortic atherosclerosis [name])   [facility] [place], Coralie Keens, NP             Passed - CBC within normal limits and completed in the last 12 months    WBC   Date Value Ref Range Status  04/02/2022 5.3 4.0 - 10.5 K/uL Final   RBC  Date Value Ref Range Status  04/02/2022 3.78 (L) 3.87 - 5.11 MIL/uL Final   Hemoglobin  Date Value Ref Range Status  04/02/2022 11.2 (L) 12.0 - 15.0 g/dL Final   HGB  Date Value Ref Range Status  09/01/2014 12.7 12.0 - 16.0 g/dL Final   HCT  Date Value Ref Range Status  04/02/2022 36.1 36.0 - 46.0 % Final  09/01/2014 37.9 35.0 - 47.0 % Final   MCHC  Date Value Ref Range Status  04/02/2022 31.0 30.0 - 36.0 g/dL Final   [facility]  Date Value Ref Range Status  04/02/2022 29.6 26.0 - 34.0 pg Final   MCV  Date Value Ref Range Status  04/02/2022 95.5 80.0 - 100.0 fL Final  09/01/2014 92 80 - 100 fL Final   No results found for: "PLTCOUNTKUC", "LABPLAT", "POCPLA" RDW  Date Value Ref Range Status  04/02/2022 12.9 11.5 - 15.5 % Final  09/01/2014 13.9 11.5 - 14.5 % Final

## 2022-04-28 NOTE — Chronic Care Management (AMB) (Signed)
Chronic Care Management CCM Pharmacy Note  04/28/2022 Name:  Kendra [name] MRN:  [id number] DOB:  03-01-1946   Subjective: Kendra [name] is an 76 y.o. year old female who is a primary patient of Kendra Fenton, NP.  The CCM team was consulted for assistance with disease management and care coordination needs.    Engaged with patient by telephone for follow up visit for pharmacy case management and/or care coordination services.   Objective:  Medications Reviewed Today     Reviewed by Vanita Ingles, RN (Case Manager) on 03/06/22 at 1416  Med List Status: <None>   Medication Order Taking? Sig Documenting Provider Last Dose Status Informant  ACCU-CHEK GUIDE test strip [id number] No USE AS DIRECTED. UP TO 4 TIMES DAILY ONCE DAILY Kendra Fenton, NP Taking Active   Accu-Chek Softclix Lancets lancets [id number] No USE AS DIRECTED. UP TO 4 TIMES A DAY [name], Kendra Keens, NP Taking Active   Acetaminophen (TYLENOL 8 HOUR ARTHRITIS PAIN PO) [id number] No Take 2 capsules by mouth in the morning, at noon, in the evening, and at bedtime. [provider] Taking Active Multiple Informants  albuterol (VENTOLIN HFA) 108 (90 Base) MCG/ACT inhaler [id number] No INHALE 1-2 PUFFS BY MOUTH EVERY 6 HOURS AS NEEDED WHEEZING/ SHORTNESS OF BREATH Kendra Fenton, NP Taking Active   aspirin EC 81 MG tablet [id number] No Take 1 tablet (81 mg total) by mouth daily. Swallow whole. Kendra Fenton, NP Taking Active   atorvastatin (LIPITOR) 40 MG tablet [id number] No TAKE 1 TABLET BY MOUTH BEDTIME Kendra Fenton, NP Taking Active   blood glucose meter kit and supplies KIT [id number] No Dispense based on patient and insurance preference. Use up to four times daily as directed. Kendra Fenton, NP Taking Active   Blood Glucose Monitoring Suppl [name] VERIO) w/Device Drucie Opitz [id number] No USE AS DIRECTED [name] [name], FNP Taking Active Multiple Informants  buPROPion (WELLBUTRIN XL) 150 MG 24 hr tablet [id number]  No TAKE 1 TABLET BY MOUTH ONCE DAILY Kendra Fenton, NP Taking Active   clopidogrel (PLAVIX) 75 MG tablet [id number] No TAKE 1 TABLET BY MOUTH ONCE DAILY [name], Kendra Keens, NP Taking Active   cyclobenzaprine (FLEXERIL) 5 MG tablet [id number] No Take 1 tablet (5 mg total) by mouth 3 (three) times daily as needed for muscle spasms. Kendra Fenton, NP Taking Active   docusate sodium (COLACE) 100 MG capsule [id number] No Take 1 capsule (100 mg total) by mouth 2 (two) times daily as needed for mild constipation. Kendra Fenton, NP Taking Active   Lancet Devices (ONE TOUCH DELICA LANCING DEV) MISC [id number] No 1 Device by Does not apply route 2 (two) times daily. [name], FNP Taking Active Multiple Informants  metFORMIN (GLUCOPHAGE) 500 MG tablet [id number] No TAKE 1 TABLET BY MOUTH TWICE DAILY WITH A MEAL [name], Kendra Keens, NP Taking Active   traZODone (DESYREL) 50 MG tablet [id number] No Take 0.5-1 tablets (25-50 mg total) by mouth at bedtime as needed for sleep. Kendra Fenton, NP Taking Active   Kendra [name] 100-62.5-25 MCG/ACT AEPB [id number] No USE ONE INHALATION INTO THE LUNGS ONCE A DAY RINSE MOUTH AFTER EACH USE Kendra Fenton, NP Taking Active             Pertinent Labs:  Lab Results  Component Value Date   HGBA1C 7.7 (H) 08/01/2021   Lab Results  Component Value Date   CHOL 135 08/01/2021   HDL 65  08/01/2021   LDLCALC 53 08/01/2021   TRIG 89 08/01/2021   CHOLHDL 2.1 08/01/2021   Lab Results  Component Value Date   CREATININE 0.73 04/02/2022   BUN 13 04/02/2022   NA 137 04/02/2022   K 3.7 04/02/2022   CL 101 04/02/2022   CO2 26 04/02/2022    SDOH:  (Social Determinants of Health) assessments and interventions performed:    [place]  Review of patient past medical history, allergies, medications, health status, including review of consultants reports, laboratory and other test data, was performed as part of comprehensive evaluation and provision of chronic  care management services.   Care Plan : PharmD - Medication Mgmt  Updates made by Rennis Petty, RPH-CPP since 04/28/2022 12:00 AM     Problem: Disease Progression      Long-Range Goal: Disease Progression Prevented or Minimized   Start Date: 11/01/2020  Expected End Date: 01/30/2021  Recent Progress: On track  Priority: High  Note:   Current Barriers:  Financial Barriers Limited social support  Pharmacist Clinical Goal(s):  Over the next 90 days, patient will maintain adherence to monitoring guidelines and medication adherence to achieve therapeutic efficacy through collaboration with PharmD and provider.   Interventions: 1:1 collaboration with Webb Silversmith, NP regarding development and update of comprehensive plan of care as evidenced by provider attestation and co-signature Inter-disciplinary care team collaboration (see longitudinal plan of care)  Coordination of Care: From review of chart, note patient missed/canceled appointments with Urology. Note appointment with Urology now scheduled for 8/23.  Patient reports still having difficulty with scheduling transportation through her [employer] coverage From review of chart, note [place] has been working with patient regarding transportation. Advise patient to place a follow up call back to Care Guide again today for further support/to ensure she will have transportation for upcoming appointments  Medication Management/adherence: Today reports that she has been without her medications, as pill packed by [name], for over 1 week Patient believes issue to be related to pharmacy needing further medication refills Outreach to [place] on behalf of patient today. Speak with Leafy Ro who advises that pharmacy has not sent pill pack as they have been unable to get in touch with patient to arrange delivery Identify that pharmacy has a previous non-working number for patient on file. Provide current  phone number Leafy Ro reviews patient's pharmacy profile during our call and confirms has active prescriptions for patient/nothing further needed for our office today and states that pharmacy will get pill pack ready for patient Return call to patient and advise her to follow up with [name] today to arrange for pick up/delivery of her pill pack    Patient Goals/Self-Care Activities Over the next 90 days, patient will:  - take medications as prescribed  Using pill packaging from [facility] - check glucose, document, and provide at future appointments - check blood pressure, document, and provide at future appointments        Plan: Telephone follow up appointment with care management team member scheduled for:  05/24/2022 11:30 AM  Wallace Cullens, PharmD, [place] [phone]

## 2022-04-28 NOTE — Telephone Encounter (Signed)
Spoke with pharmacy, Metformin duplicate request. Other two are just being requested to keep one on file. Pharmacy states to ignore it and states it is automated request that goes out when pt fills last refill.

## 2022-04-28 NOTE — Patient Instructions (Signed)
Visit Information  Thank you for taking time to visit with me today. Please don't hesitate to contact me if I can be of assistance to you before our next scheduled telephone appointment.  Following are the goals we discussed today:   Goals Addressed             This Visit's Progress    Pharmacy - Patient Goals       Please continue to keep log of home blood sugar results and have for [place] to review during our calls  Our goal bad cholesterol, or LDL, is less than 70 . This is why it is important to continue taking your atorvastatin.  I look forward to talking to you during our next telephone appointment. Please call if you need something sooner!   Wallace Cullens, PharmD, [place] [phone]          Our next appointment is by telephone on 05/24/2022 11:30 AM  Please call the care guide team at [phone] if you need to cancel or reschedule your appointment.    The patient verbalized understanding of instructions, educational materials, and care plan provided today and DECLINED offer to receive copy of patient instructions, educational materials, and care plan.

## 2022-05-02 ENCOUNTER — Telehealth: Payer: Self-pay

## 2022-05-08 ENCOUNTER — Telehealth: Payer: 59

## 2022-05-08 ENCOUNTER — Ambulatory Visit: Payer: Self-pay

## 2022-05-08 NOTE — Progress Notes (Signed)
This encounter was created in error - please disregard.

## 2022-05-08 NOTE — Chronic Care Management (AMB) (Signed)
Error in charting see new plan of care

## 2022-05-08 NOTE — Patient Instructions (Signed)
Visit Information  Thank you for taking time to visit with me today. Please don't hesitate to contact me if I can be of assistance to you.   Following are the goals we discussed today:   Goals Addressed             This Visit's Progress    RNCM: "I am still bleeding"       Care Coordination Interventions: Evaluation of current treatment plan related to uterine bleeding and patient's adherence to plan as established by provider Advised patient to make sure to keep her appointment for Wednesday, follow the instructions of the provider, report what sx and sx she has been having, and to make sure she understands her follow up instructions before leaving after there procedure Provided education to patient re: simple instructions about upcoming cystoscope, letting the pcp know she needs strips for her glucose meter and talking with pharm D for assistance with a pen needle to use to stick her finger with Reviewed medications with patient and discussed compliance. The patient states compliance with medications Reviewed scheduled/upcoming provider appointments including 05-10-2022 with the urologist for scheduled cystoscopy  Discussed plans with patient for ongoing care management follow up and provided patient with direct contact information for care management team Advised patient to discuss changes in urinary health and other questions and concerns  with provider Screening for signs and symptoms of depression related to chronic disease state  Assessed social determinant of health barriers Collaboration with the pcp and pharm D concerning the need for strips for her glucose meter and also recommendations because she has lost her pen that sticks her fingers for finger sticks. Is asking for something that wont hurt her fingers as bad.           Our next appointment is by telephone on 06-29-2022 at 1 pm  Please call the care guide team at [phone] if you need to cancel or reschedule your  appointment.   If you are experiencing a Mental Health or [place] or need someone to talk to, please call the Suicide and Crisis Lifeline: 988 call the [place] National Suicide Prevention Lifeline: 816-533-2853 or TTY: 302-483-5655 TTY 310-692-1159) to talk to a trained counselor call 1-800-273-TALK (toll free, 24 hour hotline)  The patient verbalized understanding of instructions, educational materials, and care plan provided today and DECLINED offer to receive copy of patient instructions, educational materials, and care plan.   Telephone follow up appointment with care management team member scheduled for: 06-29-2022 at 1 pm  [place], MSN, [place] [facility] Mobile: [phone]

## 2022-05-08 NOTE — Patient Outreach (Signed)
  Care Coordination   Follow Up Visit Note   05/08/2022 Name: Kendra [name] MRN: [id number] DOB: November 04, 1945  Kendra [name] is a 76 y.o. year old female who sees [name], Coralie Keens, NP for primary care. I spoke with  Kendra [name] by phone today  What matters to the patients health and wellness today?  The patient is still have uterine bleeding and is worried about her upcoming procedure    Goals Addressed             This Visit's Progress    RNCM: "I am still bleeding"       Care Coordination Interventions: Evaluation of current treatment plan related to uterine bleeding and patient's adherence to plan as established by provider Advised patient to make sure to keep her appointment for Wednesday, follow the instructions of the provider, report what sx and sx she has been having, and to make sure she understands her follow up instructions before leaving after there procedure Provided education to patient re: simple instructions about upcoming cystoscope, letting the pcp know she needs strips for her glucose meter and talking with pharm D for assistance with a pen needle to use to stick her finger with Reviewed medications with patient and discussed compliance. The patient states compliance with medications Reviewed scheduled/upcoming provider appointments including 05-10-2022 with the urologist for scheduled cystoscopy  Discussed plans with patient for ongoing care management follow up and provided patient with direct contact information for care management team Advised patient to discuss changes in urinary health and other questions and concerns  with provider Screening for signs and symptoms of depression related to chronic disease state  Assessed social determinant of health barriers Collaboration with the pcp and pharm D concerning the need for strips for her glucose meter and also recommendations because she has lost her pen that sticks her fingers for finger sticks. Is asking  for something that wont hurt her fingers as bad.           SDOH assessments and interventions completed:  Yes  SDOH Interventions Today    Flowsheet Row Most Recent Value  SDOH Interventions   Food Insecurity Interventions Intervention Not Indicated  Housing Interventions Intervention Not Indicated        Care Coordination Interventions Activated:  Yes  Care Coordination Interventions:  Yes, provided   Follow up plan: Follow up call scheduled for 06-29-2022 at 1 pm    Encounter Outcome:  Pt. Visit Completed   [name] RN, MSN, [place] [facility] Mobile: [phone]

## 2022-05-09 ENCOUNTER — Other Ambulatory Visit: Payer: Self-pay

## 2022-05-09 MED ORDER — ACCU-CHEK SOFTCLIX LANCETS MISC: 0 refills | Status: DC

## 2022-05-09 MED ORDER — ACCU-CHEK GUIDE VI STRP: ORAL_STRIP | 0 refills | Status: DC

## 2022-05-10 ENCOUNTER — Other Ambulatory Visit: Payer: 59 | Admitting: Urology

## 2022-05-23 ENCOUNTER — Emergency Department
Admission: EM | Admit: 2022-05-23 | Discharge: 2022-05-24 | Disposition: A | Discharge status: Home / Self Care | Payer: Medicare Other | Attending: Emergency Medicine | Admitting: Emergency Medicine

## 2022-05-23 ENCOUNTER — Emergency Department: Payer: Medicare Other

## 2022-05-23 DIAGNOSIS — R8271 Bacteriuria: Secondary | ICD-10-CM | POA: Diagnosis not present

## 2022-05-23 DIAGNOSIS — N200 Calculus of kidney: Secondary | ICD-10-CM | POA: Diagnosis not present

## 2022-05-23 DIAGNOSIS — N329 Bladder disorder, unspecified: Secondary | ICD-10-CM | POA: Diagnosis not present

## 2022-05-23 DIAGNOSIS — I7143 Infrarenal abdominal aortic aneurysm, without rupture: Secondary | ICD-10-CM

## 2022-05-23 DIAGNOSIS — C679 Malignant neoplasm of bladder, unspecified: Secondary | ICD-10-CM | POA: Insufficient documentation

## 2022-05-23 DIAGNOSIS — N3289 Other specified disorders of bladder: Secondary | ICD-10-CM

## 2022-05-23 DIAGNOSIS — N281 Cyst of kidney, acquired: Secondary | ICD-10-CM | POA: Diagnosis not present

## 2022-05-23 DIAGNOSIS — R319 Hematuria, unspecified: Secondary | ICD-10-CM

## 2022-05-23 LAB — COMPREHENSIVE METABOLIC PANEL
ALT: 19 U/L (ref 0–44)
AST: 23 U/L (ref 15–41)
Albumin: 4.3 g/dL (ref 3.5–5.0)
Alkaline Phosphatase: 68 U/L (ref 38–126)
Anion gap: 8 (ref 5–15)
BUN: 18 mg/dL (ref 8–23)
CO2: 28 mmol/L (ref 22–32)
Calcium: 10.2 mg/dL (ref 8.9–10.3)
Chloride: 104 mmol/L (ref 98–111)
Creatinine, Ser: 0.82 mg/dL (ref 0.44–1.00)
GFR, Estimated: >60 mL/min (ref >60)
Glucose, Bld: 160 mg/dL — ABNORMAL HIGH (ref 70–99)
Potassium: 4.3 mmol/L (ref 3.5–5.1)
Sodium: 140 mmol/L (ref 135–145)
Total Bilirubin: 0.6 mg/dL (ref 0.3–1.2)
Total Protein: 7.6 g/dL (ref 6.5–8.1)

## 2022-05-23 LAB — CBC WITH DIFFERENTIAL/PLATELET
Abs Immature Granulocytes: 0.01 10*3/uL (ref 0.00–0.07)
Basophils Absolute: 0 10*3/uL (ref 0.0–0.1)
Basophils Relative: 1 %
Eosinophils Absolute: 1.1 10*3/uL — ABNORMAL HIGH (ref 0.0–0.5)
Eosinophils Relative: 19 %
HCT: 32.7 % — ABNORMAL LOW (ref 36.0–46.0)
Hemoglobin: 10.5 g/dL — ABNORMAL LOW (ref 12.0–15.0)
Immature Granulocytes: 0 %
Lymphocytes Relative: 30 %
Lymphs Abs: 1.7 10*3/uL (ref 0.7–4.0)
MCH: 29.9 pg (ref 26.0–34.0)
MCHC: 32.1 g/dL (ref 30.0–36.0)
MCV: 93.2 fL (ref 80.0–100.0)
Monocytes Absolute: 0.2 10*3/uL (ref 0.1–1.0)
Monocytes Relative: 3 %
Neutro Abs: 2.7 10*3/uL (ref 1.7–7.7)
Neutrophils Relative %: 47 %
Platelets: 248 10*3/uL (ref 150–400)
RBC: 3.51 MIL/uL — ABNORMAL LOW (ref 3.87–5.11)
RDW: 13.7 % (ref 11.5–15.5)
WBC: 5.7 10*3/uL (ref 4.0–10.5)
nRBC: 0.4 % — ABNORMAL HIGH (ref 0.0–0.2)

## 2022-05-23 LAB — URINALYSIS, ROUTINE W REFLEX MICROSCOPIC
Bilirubin Urine: NEGATIVE
Glucose, UA: NEGATIVE mg/dL
Ketones, ur: NEGATIVE mg/dL
Leukocytes,Ua: NEGATIVE
Nitrite: NEGATIVE
Protein, ur: 100 mg/dL — AB
RBC / HPF: >50 RBC/hpf — ABNORMAL HIGH (ref 0–5)
Specific Gravity, Urine: 1.02 (ref 1.005–1.030)
pH: 5 (ref 5.0–8.0)

## 2022-05-23 LAB — TYPE AND SCREEN
ABO/RH(D): O POS
Antibody Screen: NEGATIVE

## 2022-05-23 MED ORDER — IOHEXOL 300 MG/ML  SOLN
100.0000 mL | Freq: Once | INTRAMUSCULAR | Status: AC | PRN
  Administered 2022-05-23: 100 mL via INTRAVENOUS

## 2022-05-23 MED ORDER — SODIUM CHLORIDE 0.9 % IV SOLN
1.0000 g | INTRAVENOUS | Status: AC
  Administered 2022-05-23: 1 g via INTRAVENOUS
  Filled 2022-05-23: qty 10

## 2022-05-23 MED ORDER — SODIUM CHLORIDE 0.9 % IV BOLUS
1000.0000 mL | Freq: Once | INTRAVENOUS | Status: AC
  Administered 2022-05-23: 1000 mL via INTRAVENOUS

## 2022-05-23 MED ORDER — CEPHALEXIN 500 MG PO CAPS: 500.0000 mg | ORAL_CAPSULE | Freq: Two times a day (BID) | ORAL | 0 refills | Status: DC

## 2022-05-23 NOTE — ED Provider Notes (Signed)
[name] Provider Note    Event Date/Time   First MD Initiated Contact with Patient 05/23/22 1934     (approximate)   History   Rectal Bleeding (Patient is here today for rectal bleeding x 1 month, reports that the bleeding has progressively worsened and she is now weak and fatigued; Says she is passing dark clots (not bright red blood); She does take Plavix (h/o strokes); Reports midline lower abdominal pain, denies urinary symptoms)  Please note, I discussed in detail with the patient and she reports she is not having any rectal bleeding.  She reports that she is having blood in her urine and has had that before, and knows that she has likely bladder cancer.  She has been having ongoing blood in her urine for over a month now.  She denies any black or bloody stools no vomiting.  Triage nurse to noted rectal bleeding, patient reports bleeding from her bladder and it is in her urine only.  HPI  Kendra [name] is a 76 y.o. female 1 month of blood in her urine, has had previous, was supposed to see oncologist and specialist but due to transportation issues has not yet been able to do so.  She does have another upcoming appointment with her oncologist, and is working with the cancer center for transportation.  However, she is continue to see blood in her urine in particular she has had blood in her urine daily for about a month.  She reports she started to feel slightly fatigued.  No nausea no vomiting no fevers no chills no other symptoms just ongoing blood in her urine and she is worried that she may need a blood transfusion as she is developing some fatigue now.     Physical Exam   Triage Vital Signs: ED Triage Vitals  Enc Vitals Group     BP 05/23/22 1514 116/68     Pulse Rate 05/23/22 1514 92     Resp 05/23/22 1514 18     Temp 05/23/22 1514 98.5 F (36.9 C)     Temp Source 05/23/22 1514 Oral     SpO2 05/23/22 1514 97 %     Weight 05/23/22 1528 109 lb  9.6 oz (49.7 kg)     Height 05/23/22 1528 '5\' 5"'$  (1.651 m)     Head Circumference --      Peak Flow --      Pain Score 05/23/22 1515 1     Pain Loc --      Pain Edu? --      Excl. in [place]? --     Most recent vital signs: Vitals:   05/23/22 2100 05/23/22 2304  BP: (!) 161/84 (!) 153/97  Pulse: 84 97  Resp: 18 18  Temp:    SpO2: 100% 96%     General: Awake, no distress.  Pleasant.  Appears somewhat underweight but not cachectic. CV:  Good peripheral perfusion.  Normal heart tones Resp:  Normal effort.  Clear bilateral Abd:  No distention.  Soft nontender nondistended throughout except she reports a very mild discomfort in the suprapubic region without rebound or guarding Other:  Warm well-perfused extremities.  Oriented x4.  In no distress.   ED Results / Procedures / Treatments   Labs (all labs ordered are listed, but only abnormal results are displayed) Labs Reviewed  CBC WITH DIFFERENTIAL/PLATELET - Abnormal; Notable for the following components:      Result Value   RBC 3.51 (*)  Hemoglobin 10.5 (*)    HCT 32.7 (*)    nRBC 0.4 (*)    Eosinophils Absolute 1.1 (*)    All other components within normal limits  COMPREHENSIVE METABOLIC PANEL - Abnormal; Notable for the following components:   Glucose, Bld 160 (*)    All other components within normal limits  URINALYSIS, ROUTINE W REFLEX MICROSCOPIC - Abnormal; Notable for the following components:   Color, Urine AMBER (*)    APPearance CLOUDY (*)    Hgb urine dipstick LARGE (*)    Protein, ur 100 (*)    RBC / HPF >50 (*)    Bacteria, UA FEW (*)    All other components within normal limits  URINE CULTURE  TYPE AND SCREEN    RADIOLOGY  CT HEMATURIA WORKUP  Result Date: 05/23/2022 CLINICAL DATA:  Gross hematuria. EXAM: CT ABDOMEN AND PELVIS WITHOUT AND WITH CONTRAST TECHNIQUE: Multidetector CT imaging of the abdomen and pelvis was performed following the standard protocol before and following the bolus  administration of intravenous contrast. RADIATION DOSE REDUCTION: This exam was performed according to the departmental dose-optimization program which includes automated exposure control, adjustment of the mA and/or kV according to patient size and/or use of iterative reconstruction technique. CONTRAST:  140m OMNIPAQUE IOHEXOL 300 MG/ML  SOLN COMPARISON:  CT 02/08/2022 FINDINGS: Lower chest: The lung bases are clear. Hepatobiliary: No focal liver abnormality is seen. No gallstones, gallbladder wall thickening, or biliary dilatation. Pancreas: No ductal dilatation or inflammation. Spleen: Normal in size without focal abnormality. Adrenals/Urinary Tract: 11 mm left adrenal nodule is unchanged from prior exam. No follow-up imaging is recommended. Normal right adrenal gland. There are multiple bilateral nonobstructing intrarenal calculi. No hydronephrosis. Stable small cyst in the upper left kidney, no further follow-up is recommended. No solid or enhancing renal lesion. No perinephric edema. There are no filling defects within the ureters on delayed phase imaging. Again seen 2 enhancing lobulated polypoid lesions in the urinary bladder. Lesion arising in the left anterior dome measures 2 cm, unchanged. Right inferior lesion measures 18 mm, previously 7 mm. No definite additional new lesion. There is mild enhancement along the left lateral urinary bladder. Stomach/Bowel: Unremarkable appearance of the stomach. There is no bowel obstruction or inflammation. Normal appendix. Moderate volume of colonic stool. Vascular/Lymphatic: 4 cm infrarenal aortic aneurysm, unchanged in size on my retrospective measurement. Dense aortic atherosclerosis. No aneurysm rupture or acute aortic findings. Patent portal vein. No suspicious abdominopelvic lymph nodes. Reproductive: Uterus and bilateral adnexa are unremarkable. Other: No free air or ascites. Musculoskeletal: Degenerative change in the spine and right hip. There are no acute or  suspicious osseous abnormalities. IMPRESSION: 1. Two enhancing lobulated polypoid lesions in the urinary bladder highly suspicious for neoplasm, largest measuring 2 cm. The more inferior lesion has enlarged from prior exam. Cystoscopy again suggested. 2. Bilateral nonobstructing intrarenal calculi. 3. Unchanged 4 cm infrarenal aortic aneurysm. Recommend follow-up every 12 months and vascular consultation. This recommendation follows ACR consensus guidelines: White Paper of the ACR Incidental Findings Committee II on Vascular Findings. J Am Coll Radiol 2013; 10:789-794. Aortic Atherosclerosis (ICD10-I70.0). Electronically Signed   By: MKeith RakeM.D.   On: 05/23/2022 23:08       PROCEDURES:  Critical Care performed: No  Procedures   MEDICATIONS ORDERED IN ED: Medications  sodium chloride 0.9 % bolus 1,000 mL (1,000 mLs Intravenous New Bag/Given 05/23/22 2026)  cefTRIAXone (ROCEPHIN) 1 g in sodium chloride 0.9 % 100 mL IVPB (0 g Intravenous Stopped 05/23/22  2111)  iohexol (OMNIPAQUE) 300 MG/ML solution 100 mL (100 mLs Intravenous Contrast Given 05/23/22 2207)     IMPRESSION / MDM / ASSESSMENT AND PLAN / ED COURSE  I reviewed the triage vital signs and the nursing notes.                              Differential diagnosis includes, but is not limited to, possible hemorrhagic cystitis, bleeding secondary to urologic cancer, kidney stones, UTI, renal lesion etc.  Based on review of previous studies including primary care note the patient has findings suspicious for bladder cancer, it appears that she has not been able to get to see oncology due to transportation issues and at this juncture seems to have ongoing hematuria.  Her hemoglobin is not acutely low to the point that I would suspect it is obviously causing severe weakness, but she does have obvious hematuria in her urine without passing clots or difficulty voiding.  No obvious infectious symptoms but bacteria are  present.    Patient's presentation is most consistent with acute illness / injury with system symptoms.  The patient is on the cardiac monitor to evaluate for evidence of arrhythmia and/or significant heart rate changes.  Clinical Course as of 05/23/22 2337  Tue May 23, 2022  2324 CT head on my review and interpretation does not show any evidence of subdural hematoma. [PR]    Clinical Course User Index [PR] Merlyn Lot, MD   ----------------------------------------- 11:44 PM on 05/23/2022 ----------------------------------------- Patient is comfortable with plan for discharge.  She feels improved resting comfortably without distress having received fluids and antibiotics.  We discussed in careful detail the need for follow-up, and I have also communicated and spoke with Dr. Janese Banks who advises she will discuss with the patient's hematology and oncology team tomorrow to set up close follow-up and assist with getting patient in for evaluation as she is not had much success in getting in due to transportation issues.  Patient advises comfortable with plan for discharge and careful return precautions.  She is very understanding of the need for follow-up with both urology and oncology and I have placed urgent referrals to both and communicated directly with oncology on this.  Reassuring exam at this time.  Hematuria appears to be quite chronic without evidence of acute instability.  Will start on Keflex twice daily for a week as well as we await culture in the event that urinary tract infection may also be present though it is somewhat hard to delineate at this time.  Return precautions and treatment recommendations and follow-up discussed with the patient who is agreeable with the plan.  Patient advises that she thinks her sisters nephew will be able to come pick her up  to take her home and get prescription  FINAL CLINICAL IMPRESSION(S) / ED DIAGNOSES   Final diagnoses:  Bladder mass   Hematuria, unspecified type  Bacteria in urine  Infrarenal abdominal aortic aneurysm (AAA) without rupture [facility])     Rx / DC Orders   ED Discharge Orders          Ordered    Ambulatory referral to Urology       Comments: Hematuria, concern for possible bladder cancer or polyps   05/23/22 2329    Ambulatory referral to Hematology / Oncology        05/23/22 2329    cephALEXin (KEFLEX) 500 MG capsule  2 times daily  05/23/22 2336             Note:  This document was prepared using Dragon voice recognition software and may include unintentional dictation errors.   Delman Kitten, MD 05/23/22 [phone]

## 2022-05-23 NOTE — ED Notes (Signed)
Patient transported to CT 

## 2022-05-23 NOTE — ED Provider Triage Note (Signed)
Emergency Medicine Provider Triage Evaluation Note  Kendra [name] , a 76 y.o. female  was evaluated in triage.  Pt complains of rectal bleeding, dark red, patient is also on Plavix.  Review of Systems  Positive:  Negative:   Physical Exam  BP 116/68   Pulse 92   Temp 98.5 F (36.9 C) (Oral)   Resp 18   SpO2 97%  Gen:   Awake, no distress   Resp:  Normal effort  MSK:   Moves extremities without difficulty  Other:    Medical Decision Making  Medically screening exam initiated at 3:17 PM.  Appropriate orders placed.  Kendra [name] was informed that the remainder of the evaluation will be completed by another provider, this initial triage assessment does not replace that evaluation, and the importance of remaining in the ED until their evaluation is complete.  GI bleed protocols initiated by nursing staff   Versie Starks, PA-C 05/23/22 1518

## 2022-05-23 NOTE — ED Triage Notes (Signed)
Patient is here today for rectal bleeding x 1 month, reports that the bleeding has progressively worsened and she is now weak and fatigued; Says she is passing dark clots (not bright red blood); She does take Plavix (h/o strokes); Reports midline lower abdominal pain, denies urinary symptoms

## 2022-05-24 ENCOUNTER — Telehealth: Payer: Self-pay

## 2022-05-24 ENCOUNTER — Other Ambulatory Visit: Payer: Self-pay | Admitting: Internal Medicine

## 2022-05-24 ENCOUNTER — Telehealth: Payer: Self-pay | Admitting: Pharmacist

## 2022-05-24 ENCOUNTER — Telehealth: Payer: Medicare Other

## 2022-05-24 LAB — URINE CULTURE: Culture: NO GROWTH

## 2022-05-24 NOTE — Telephone Encounter (Signed)
-----   Message from Earlie Server, MD sent at 05/24/2022  8:02 AM EDT ----- ER sent message : "Needs close follow-up.  Has transportation issues and has missed appointments.  Significant concern for possible bladder pathology or cancer"   Please move his app up to this or next week

## 2022-05-24 NOTE — ED Notes (Signed)
Patient verbalizes understanding of discharge instructions. Opportunity for questioning and answers were provided. Armband removed by staff, pt discharged from ED. Pt's family called for transport home

## 2022-05-24 NOTE — Telephone Encounter (Signed)
Per Dr. Tasia Catchings, please move up appts to this week or next. Pt has transportation issues, she may need [employer]. Please inform pt of appt.

## 2022-05-24 NOTE — Telephone Encounter (Signed)
  Chronic Care Management   Outreach Note  05/24/2022 Name: JUSTYN BOYSON MRN: [id number] DOB: 31-May-1946  Referred by: [name], NP Reason for referral : No chief complaint on file.   Was unable to reach patient via telephone today and unable to leave a message as no voicemail picks up   Follow Up Plan: CM Pharmacist will attempt to reach patient by telephone again within the next 30 days  Wallace Cullens, PharmD, [place] [facility] [phone]

## 2022-05-25 ENCOUNTER — Other Ambulatory Visit: Payer: Self-pay | Admitting: Internal Medicine

## 2022-05-25 DIAGNOSIS — J449 Chronic obstructive pulmonary disease, unspecified: Secondary | ICD-10-CM

## 2022-05-25 NOTE — Telephone Encounter (Signed)
Refilled 04/13/22 # 90. Pt. Should still have medication. Requested Prescriptions  Refused Prescriptions Disp Refills  . buPROPion (WELLBUTRIN XL) 150 MG 24 hr tablet [Pharmacy Med Name: BUPROPION HCL ER (XL) 150 MG TAB] 90 tablet 0    Sig: TAKE 1 TABLET BY MOUTH ONCE DAILY     Psychiatry: Antidepressants - bupropion Failed - 05/24/2022  2:54 PM      Failed - Last BP in normal range    BP Readings from Last 1 Encounters:  05/23/22 (!) 142/88         Passed - Cr in normal range and within 360 days    Creat  Date Value Ref Range Status  08/01/2021 0.85 0.60 - 1.00 mg/dL Final   Creatinine, Ser  Date Value Ref Range Status  05/23/2022 0.82 0.44 - 1.00 mg/dL Final   Creatinine, Urine  Date Value Ref Range Status  05/31/2021 89 20 - 275 mg/dL Final         Passed - AST in normal range and within 360 days    AST  Date Value Ref Range Status  05/23/2022 23 15 - 41 U/L Final   SGOT(AST)  Date Value Ref Range Status  09/01/2014 20 15 - 37 Unit/L Final         Passed - ALT in normal range and within 360 days    ALT  Date Value Ref Range Status  05/23/2022 19 0 - 44 U/L Final   SGPT (ALT)  Date Value Ref Range Status  09/01/2014 24 U/L Final    Comment:    14-63 NOTE: New Reference Range 04/07/14          Passed - Completed PHQ-2 or PHQ-9 in the last 360 days      Passed - Valid encounter within last 6 months    Recent Outpatient Visits          7 months ago Nonspecific chest pain   [facility] [place], Coralie Keens, NP   7 months ago Chest pain, unspecified type   [facility] [place], Coralie Keens, NP   9 months ago Controlled type 2 diabetes mellitus with complication, without long-term current use of insulin [facility])   [facility] [place], Coralie Keens, NP   11 months ago Chronic right shoulder pain   [facility] [place], Coralie Keens, NP   11 months ago Aortic atherosclerosis [facility])   [facility] [place], Coralie Keens, NP      Future Appointments            In 1 week Garn[facility] Gunner, Coralie Keens, NP [facility], [facility]

## 2022-05-26 NOTE — Telephone Encounter (Signed)
Requested Prescriptions  Pending Prescriptions Disp Refills  . albuterol (VENTOLIN HFA) 108 (90 Base) MCG/ACT inhaler [Pharmacy Med Name: ALBUTEROL SULFATE HFA 108 (90 BASE)] 8.5 g 1    Sig: INHALE 1-2 PUFFS BY MOUTH EVERY 6 HOURS AS NEEDED WHEEZING/ SHORTNESS OF BREATH     Pulmonology:  Beta Agonists 2 Failed - 05/25/2022  3:55 PM      Failed - Last BP in normal range    BP Readings from Last 1 Encounters:  05/23/22 (!) 142/88         Passed - Last Heart Rate in normal range    Pulse Readings from Last 1 Encounters:  05/23/22 88         Passed - Valid encounter within last 12 months    Recent Outpatient Visits          7 months ago Nonspecific chest pain   [facility] [place], Coralie Keens, NP   7 months ago Chest pain, unspecified type   [facility] [place], Coralie Keens, NP   9 months ago Controlled type 2 diabetes mellitus with complication, without long-term current use of insulin [facility])   [facility], NP   11 months ago Chronic right shoulder pain   [facility] [place], Coralie Keens, NP   12 months ago Aortic atherosclerosis [facility])   [facility], Coralie Keens, NP      Future Appointments            In 6 days [place], Coralie Keens, NP [facility], [facility]

## 2022-05-29 ENCOUNTER — Other Ambulatory Visit: Payer: Self-pay | Admitting: Internal Medicine

## 2022-05-29 NOTE — Telephone Encounter (Signed)
Requested medication (s) are due for refill today: yes  Requested medication (s) are on the active medication list: yes  Last refill:  04/20/22 #30/0  Future visit scheduled: yes  Notes to clinic:  Unable to refill per protocol, cannot delegate.      Requested Prescriptions  Pending Prescriptions Disp Refills   cyclobenzaprine (FLEXERIL) 5 MG tablet [Pharmacy Med Name: CYCLOBENZAPRINE HCL 5 MG TAB] 30 tablet 0    Sig: TAKE 1 TABLET BY MOUTH 3 TIMES DAILY AS NEEDED FOR MUSCLE SPASMS     Not Delegated - Analgesics:  Muscle Relaxants Failed - 05/29/2022  1:03 PM      Failed - This refill cannot be delegated      Passed - Valid encounter within last 6 months    Recent Outpatient Visits           7 months ago Nonspecific chest pain   [facility] [place], Coralie Keens, NP   7 months ago Chest pain, unspecified type   [facility] [place], Coralie Keens, NP   10 months ago Controlled type 2 diabetes mellitus with complication, without long-term current use of insulin [facility])   [facility], NP   11 months ago Chronic right shoulder pain   [facility] [place], Coralie Keens, NP   12 months ago Aortic atherosclerosis [facility])   [facility], Coralie Keens, NP       Future Appointments             In 3 days [employer], Coralie Keens, NP [facility], [facility]

## 2022-05-30 ENCOUNTER — Inpatient Hospital Stay: Payer: Medicaid Other

## 2022-05-30 ENCOUNTER — Inpatient Hospital Stay: Payer: Medicaid Other | Admitting: Oncology

## 2022-06-01 ENCOUNTER — Ambulatory Visit: Payer: Medicare Other | Admitting: Internal Medicine

## 2022-06-01 ENCOUNTER — Telehealth (INDEPENDENT_AMBULATORY_CARE_PROVIDER_SITE_OTHER): Payer: Self-pay

## 2022-06-01 NOTE — Telephone Encounter (Signed)
Pt called stating that she saw Dr Lucky Cowboy on 09/05 and she is still having the same problem and it is getting worse.  She asked for Dr Lucky Cowboy to give her a call back.  I tried calling the pt back there was no answer and no vm.  I will try to reach out to her again later today.

## 2022-06-01 NOTE — Progress Notes (Deleted)
Subjective:    Patient ID: Kendra [name], female    DOB: 03-Mar-1946, 76 y.o.   MRN: [id number]  HPI  Patient presents to clinic today for ER follow-up.  She presented to the ER 9/5 with complaint of blood in her urine and blood in her stool for the last month.  She has known bladder cancer but is not getting any treatment for this.  CT abdomen/pelvis did not show any evidence of a GI bleed.  Labs revealed a stable anemia.  She was given Keflex, discharged and advised to follow-up with her PCP and urology.  Since that time.  Review of Systems     Past Medical History:  Diagnosis Date   Asthma    Back pain    Cataract    Cervical disc disorder    COPD (chronic obstructive pulmonary disease) (HCC)    Depression    Diabetes (HCC)    type II   Diabetic neuropathy (HCC)    Dyspnea    with exertion   GERD (gastroesophageal reflux disease)    History of kidney stones    per patient "can't remember the year"   Hyperlipidemia    Hypertension    per patient "take meds for it"   Insomnia    Neuropathy    Osteoarthritis    Osteopenia    Pneumonia 2018   per patient   Reflux    Rheumatoid arthritis (HCC)    Stroke (HCC) 2015   and again 2017  - Weakness in left leg, staggers w/ walking, vision    Tenosynovitis     Current Outpatient Medications  Medication Sig Dispense Refill   Accu-Chek Softclix Lancets lancets USE AS DIRECTED. UP TO 4 TIMES A DAY DX: E11.9 400 each 0   Acetaminophen (TYLENOL 8 HOUR ARTHRITIS PAIN PO) Take 2 capsules by mouth in the morning, at noon, in the evening, and at bedtime.     albuterol (VENTOLIN HFA) 108 (90 Base) MCG/ACT inhaler INHALE 1-2 PUFFS BY MOUTH EVERY 6 HOURS AS NEEDED WHEEZING/ SHORTNESS OF BREATH 8.5 g 1   aspirin EC 81 MG tablet Take 1 tablet (81 mg total) by mouth daily. Swallow whole. 90 tablet 1   atorvastatin (LIPITOR) 40 MG tablet TAKE 1 TABLET BY MOUTH BEDTIME 90 tablet 0   blood glucose meter kit and supplies KIT Dispense based  on patient and insurance preference. Use up to four times daily as directed. 1 each 0   Blood Glucose Monitoring Suppl (ONETOUCH VERIO) w/Device KIT USE AS DIRECTED 1 kit 0   buPROPion (WELLBUTRIN XL) 150 MG 24 hr tablet TAKE 1 TABLET BY MOUTH ONCE DAILY 90 tablet 0   cephALEXin (KEFLEX) 500 MG capsule Take 1 capsule (500 mg total) by mouth 2 (two) times daily. 14 capsule 0   clopidogrel (PLAVIX) 75 MG tablet Take 1 tablet (75 mg total) by mouth daily. 90 tablet 0   cyclobenzaprine (FLEXERIL) 5 MG tablet TAKE 1 TABLET BY MOUTH 3 TIMES DAILY AS NEEDED FOR MUSCLE SPASMS 30 tablet 0   docusate sodium (COLACE) 100 MG capsule Take 1 capsule (100 mg total) by mouth 2 (two) times daily as needed for mild constipation. 60 capsule 2   famotidine (PEPCID) 20 MG tablet Take 1 tablet (20 mg total) by mouth 2 (two) times daily. 60 tablet 0   glucose blood (ACCU-CHEK GUIDE) test strip Use as directed up to 4 times daily. DX: E11.9 400 each 0   Lancet Devices (ONE TOUCH  DELICA LANCING DEV) MISC 1 Device by Does not apply route 2 (two) times daily. 200 each 4   metFORMIN (GLUCOPHAGE) 500 MG tablet TAKE 1 TABLET BY MOUTH TWICE DAILY WITH A MEAL 180 tablet 0   traZODone (DESYREL) 50 MG tablet TAKE 1/2 TO 1 TABLET BY MOUTH AT BEDTIMEAS NEEDED SLEEP 90 tablet 0   TRELEGY ELLIPTA 100-62.5-25 MCG/ACT AEPB USE ONE INHALATION INTO THE LUNGS ONCE A DAY RINSE MOUTH AFTER EACH USE 60 each 2   No current facility-administered medications for this visit.    Allergies  Allergen Reactions   Citalopram Nausea And Vomiting    Family History  Problem Relation Age of Onset   Stroke Father    Depression Father    Diabetes Mother    Hyperlipidemia Mother    Breast cancer Mother    Heart murmur Son    Heart murmur Son    Heart murmur Son     Social History   Socioeconomic History   Marital status: Widowed    Spouse name: Not on file   Number of children: 3   Years of education: 58   Highest education level: Not  on file  Occupational History   Occupation: Disabled  Tobacco Use   Smoking status: Some Days    Packs/day: 0.25    Years: 61.00    Total pack years: 15.25    Types: Cigarettes   Smokeless tobacco: Former    Types: Snuff  Vaping Use   Vaping Use: Never used  Substance and Sexual Activity   Alcohol use: No   Drug use: No   Sexual activity: Never  Other Topics Concern   Not on file  Social History Narrative   Not on file   Social Determinants of Health   Financial Resource Strain: Medium Risk (01/04/2022)   Overall Financial Resource Strain (CARDIA)    Difficulty of Paying Living Expenses: Somewhat hard  Food Insecurity: No Food Insecurity (05/08/2022)   Hunger Vital Sign    Worried About Running Out of Food in the Last Year: Never true    [place] in the Last Year: Never true  Transportation Needs: No Transportation Needs (02/20/2022)   PRAPARE - Hydrologist (Medical): No    Lack of Transportation (Non-Medical): No  Physical Activity: Inactive (11/14/2021)   Exercise Vital Sign    Days of Exercise per Week: 0 days    Minutes of Exercise per Session: 0 min  Stress: Stress Concern Present (01/04/2022)   [place]    Feeling of Stress : Rather much  Social Connections: Socially Isolated (11/14/2021)   Social Connection and Isolation Panel [NHANES]    Frequency of Communication with Friends and Family: More than three times a week    Frequency of Social Gatherings with Friends and Family: More than three times a week    Attends Religious Services: Never    Marine scientist or Organizations: No    Attends Archivist Meetings: Never    Marital Status: Widowed  Intimate Partner Violence: Not At Risk (05/08/2022)   Humiliation, Afraid, Rape, and Kick questionnaire    Fear of Current or Ex-Partner: No    Emotionally Abused: No    Physically Abused: No    Sexually  Abused: No     Constitutional: Denies fever, malaise, fatigue, headache or abrupt weight changes.  HEENT: Denies eye pain, eye redness, ear pain, ringing  in the ears, wax buildup, runny nose, nasal congestion, bloody nose, or sore throat. Respiratory: Denies difficulty breathing, shortness of breath, cough or sputum production.   Cardiovascular: Denies chest pain, chest tightness, palpitations or swelling in the hands or feet.  Gastrointestinal: Denies abdominal pain, bloating, constipation, diarrhea or blood in the stool.  GU: Patient reports blood in urine.  Denies urgency, frequency, pain with urination, burning sensation, odor or discharge. Musculoskeletal: Denies decrease in range of motion, difficulty with gait, muscle pain or joint pain and swelling.  Skin: Denies redness, rashes, lesions or ulcercations.  Neurological: Denies dizziness, difficulty with memory, difficulty with speech or problems with balance and coordination.  Psych: Denies anxiety, depression, SI/HI.  No other specific complaints in a complete review of systems (except as listed in HPI above).  Objective:   Physical Exam  There were no vitals taken for this visit. Wt Readings from Last 3 Encounters:  05/23/22 109 lb 9.6 oz (49.7 kg)  04/02/22 112 lb (50.8 kg)  02/20/22 113 lb (51.3 kg)    General: Appears their stated age, well developed, well nourished in NAD. Skin: Warm, dry and intact. No rashes, lesions or ulcerations noted. HEENT: Head: normal shape and size; Eyes: sclera white, no icterus, conjunctiva pink, PERRLA and EOMs intact; Ears: Tm's gray and intact, normal light reflex; Nose: mucosa pink and moist, septum midline; Throat/Mouth: Teeth present, mucosa pink and moist, no exudate, lesions or ulcerations noted.  Neck:  Neck supple, trachea midline. No masses, lumps or thyromegaly present.  Cardiovascular: Normal rate and rhythm. S1,S2 noted.  No murmur, rubs or gallops noted. No JVD or BLE edema. No  carotid bruits noted. Pulmonary/Chest: Normal effort and positive vesicular breath sounds. No respiratory distress. No wheezes, rales or ronchi noted.  Abdomen: Soft and nontender. Normal bowel sounds. No distention or masses noted. Liver, spleen and kidneys non palpable. Musculoskeletal: Normal range of motion. No signs of joint swelling. No difficulty with gait.  Neurological: Alert and oriented. Cranial nerves II-XII grossly intact. Coordination normal.  Psychiatric: Mood and affect normal. Behavior is normal. Judgment and thought content normal.    BMET    Component Value Date/Time   NA 140 05/23/2022 1523   NA 141 01/18/2016 1150   NA 138 09/01/2014 2130   K 4.3 05/23/2022 1523   K 3.5 09/01/2014 2130   CL 104 05/23/2022 1523   CL 104 09/01/2014 2130   CO2 28 05/23/2022 1523   CO2 28 09/01/2014 2130   GLUCOSE 160 (H) 05/23/2022 1523   GLUCOSE 287 (H) 09/01/2014 2130   BUN 18 05/23/2022 1523   BUN 12 01/18/2016 1150   BUN 14 09/01/2014 2130   CREATININE 0.82 05/23/2022 1523   CREATININE 0.85 08/01/2021 1021   CALCIUM 10.2 05/23/2022 1523   CALCIUM 8.9 09/01/2014 2130   GFRNONAA >60 05/23/2022 1523   GFRNONAA 70 06/14/2020 0929   GFRAA 82 06/14/2020 0929    Lipid Panel     Component Value Date/Time   CHOL 135 08/01/2021 1021   CHOL 177 06/07/2015 1215   CHOL 141 09/02/2014 0440   TRIG 89 08/01/2021 1021   TRIG 155 09/02/2014 0440   HDL 65 08/01/2021 1021   HDL 56 06/07/2015 1215   HDL 37 (L) 09/02/2014 0440   CHOLHDL 2.1 08/01/2021 1021   VLDL 16 10/17/2019 0606   VLDL 31 09/02/2014 0440   LDLCALC 53 08/01/2021 1021   LDLCALC 73 09/02/2014 0440    CBC    Component Value  Date/Time   WBC 5.7 05/23/2022 1523   RBC 3.51 (L) 05/23/2022 1523   HGB 10.5 (L) 05/23/2022 1523   HGB 12.7 09/01/2014 2130   HCT 32.7 (L) 05/23/2022 1523   HCT 37.9 09/01/2014 2130   PLT 248 05/23/2022 1523   PLT 260 09/05/2014 0517   MCV 93.2 05/23/2022 1523   MCV 92 09/01/2014  2130   MCH 29.9 05/23/2022 1523   MCHC 32.1 05/23/2022 1523   RDW 13.7 05/23/2022 1523   RDW 13.9 09/01/2014 2130   LYMPHSABS 1.7 05/23/2022 1523   MONOABS 0.2 05/23/2022 1523   EOSABS 1.1 (H) 05/23/2022 1523   BASOSABS 0.0 05/23/2022 1523    Hgb A1C Lab Results  Component Value Date   HGBA1C 7.7 (H) 08/01/2021            Assessment & Plan:   ER follow-up for Hematuria, Bladder Cancer:  ER notes, labs and imaging reviewed I would recommend she follow-up with urology and oncology as prescribed  Schedule follow-up for follow-up of chronic conditions Webb Silversmith, NP

## 2022-06-02 ENCOUNTER — Encounter: Payer: Self-pay | Admitting: Urology

## 2022-06-02 NOTE — Telephone Encounter (Signed)
Called pt back again no answer and no VM

## 2022-06-02 NOTE — Telephone Encounter (Signed)
Tried calling pt again no answer and no VM

## 2022-06-05 ENCOUNTER — Ambulatory Visit: Payer: Medicaid Other | Admitting: Oncology

## 2022-06-05 ENCOUNTER — Telehealth (INDEPENDENT_AMBULATORY_CARE_PROVIDER_SITE_OTHER): Payer: Self-pay | Admitting: Vascular Surgery

## 2022-06-05 ENCOUNTER — Other Ambulatory Visit: Payer: Medicaid Other

## 2022-06-05 NOTE — Telephone Encounter (Signed)
Reach out to the patient for more information but I was not able to leave a message

## 2022-06-05 NOTE — Telephone Encounter (Signed)
Patient will need to referral to be seen since it has been over 3 years.

## 2022-06-05 NOTE — Telephone Encounter (Signed)
Patient LVM stating she is trying to get an appointment with jd. Stating the same problem she saw him for  before is happening again.  Please advise.

## 2022-06-08 ENCOUNTER — Ambulatory Visit: Payer: Medicare Other | Admitting: Internal Medicine

## 2022-06-09 ENCOUNTER — Telehealth: Payer: Self-pay | Admitting: Oncology

## 2022-06-09 NOTE — Telephone Encounter (Signed)
Reached out to patient to confirm appt for 06/12/2022 and to confirm transportation. Couldn't reach patient with phone numbers listed.

## 2022-06-12 ENCOUNTER — Inpatient Hospital Stay: Payer: Medicaid Other

## 2022-06-12 ENCOUNTER — Inpatient Hospital Stay: Payer: Medicaid Other | Admitting: Oncology

## 2022-06-12 ENCOUNTER — Telehealth: Payer: Self-pay

## 2022-06-12 NOTE — Telephone Encounter (Signed)
Pt has had a couple no shows and staff is unable to reach her at any ph number in chart. No show letter sent to pt to call back and r/s.

## 2022-06-15 ENCOUNTER — Telehealth: Payer: Self-pay | Admitting: Internal Medicine

## 2022-06-15 ENCOUNTER — Other Ambulatory Visit: Payer: Self-pay | Admitting: Urology

## 2022-06-15 ENCOUNTER — Emergency Department: Payer: Medicare Other

## 2022-06-15 ENCOUNTER — Inpatient Hospital Stay
Admission: EM | Admit: 2022-06-15 | Discharge: 2022-06-18 | DRG: 687 | Disposition: A | Discharge status: Home / Self Care | Payer: Medicare Other | Attending: Internal Medicine | Admitting: Internal Medicine

## 2022-06-15 ENCOUNTER — Ambulatory Visit: Payer: Medicare Other | Admitting: Internal Medicine

## 2022-06-15 ENCOUNTER — Ambulatory Visit: Payer: Self-pay | Admitting: *Deleted

## 2022-06-15 DIAGNOSIS — K219 Gastro-esophageal reflux disease without esophagitis: Secondary | ICD-10-CM | POA: Diagnosis not present

## 2022-06-15 DIAGNOSIS — M8580 Other specified disorders of bone density and structure, unspecified site: Secondary | ICD-10-CM | POA: Diagnosis present

## 2022-06-15 DIAGNOSIS — M069 Rheumatoid arthritis, unspecified: Secondary | ICD-10-CM | POA: Diagnosis present

## 2022-06-15 DIAGNOSIS — R319 Hematuria, unspecified: Secondary | ICD-10-CM | POA: Diagnosis not present

## 2022-06-15 DIAGNOSIS — F17200 Nicotine dependence, unspecified, uncomplicated: Secondary | ICD-10-CM | POA: Diagnosis present

## 2022-06-15 DIAGNOSIS — Z7982 Long term (current) use of aspirin: Secondary | ICD-10-CM | POA: Diagnosis not present

## 2022-06-15 DIAGNOSIS — R630 Anorexia: Secondary | ICD-10-CM | POA: Diagnosis present

## 2022-06-15 DIAGNOSIS — F17210 Nicotine dependence, cigarettes, uncomplicated: Secondary | ICD-10-CM | POA: Diagnosis not present

## 2022-06-15 DIAGNOSIS — D62 Acute posthemorrhagic anemia: Secondary | ICD-10-CM | POA: Diagnosis present

## 2022-06-15 DIAGNOSIS — F32A Depression, unspecified: Secondary | ICD-10-CM | POA: Diagnosis present

## 2022-06-15 DIAGNOSIS — I10 Essential (primary) hypertension: Secondary | ICD-10-CM | POA: Diagnosis present

## 2022-06-15 DIAGNOSIS — I69354 Hemiplegia and hemiparesis following cerebral infarction affecting left non-dominant side: Secondary | ICD-10-CM

## 2022-06-15 DIAGNOSIS — G4700 Insomnia, unspecified: Secondary | ICD-10-CM | POA: Diagnosis present

## 2022-06-15 DIAGNOSIS — Z681 Body mass index (BMI) 19 or less, adult: Secondary | ICD-10-CM

## 2022-06-15 DIAGNOSIS — E1140 Type 2 diabetes mellitus with diabetic neuropathy, unspecified: Secondary | ICD-10-CM | POA: Diagnosis not present

## 2022-06-15 DIAGNOSIS — Z7902 Long term (current) use of antithrombotics/antiplatelets: Secondary | ICD-10-CM

## 2022-06-15 DIAGNOSIS — D494 Neoplasm of unspecified behavior of bladder: Secondary | ICD-10-CM | POA: Diagnosis not present

## 2022-06-15 DIAGNOSIS — I714 Abdominal aortic aneurysm, without rupture, unspecified: Secondary | ICD-10-CM | POA: Diagnosis not present

## 2022-06-15 DIAGNOSIS — R634 Abnormal weight loss: Secondary | ICD-10-CM | POA: Diagnosis not present

## 2022-06-15 DIAGNOSIS — N200 Calculus of kidney: Secondary | ICD-10-CM | POA: Diagnosis not present

## 2022-06-15 DIAGNOSIS — Z888 Allergy status to other drugs, medicaments and biological substances status: Secondary | ICD-10-CM

## 2022-06-15 DIAGNOSIS — R296 Repeated falls: Secondary | ICD-10-CM | POA: Diagnosis present

## 2022-06-15 DIAGNOSIS — J4489 Other specified chronic obstructive pulmonary disease: Secondary | ICD-10-CM | POA: Diagnosis not present

## 2022-06-15 DIAGNOSIS — Z743 Need for continuous supervision: Secondary | ICD-10-CM | POA: Diagnosis not present

## 2022-06-15 DIAGNOSIS — E785 Hyperlipidemia, unspecified: Secondary | ICD-10-CM | POA: Diagnosis not present

## 2022-06-15 DIAGNOSIS — D649 Anemia, unspecified: Secondary | ICD-10-CM | POA: Diagnosis not present

## 2022-06-15 DIAGNOSIS — M5090 Cervical disc disorder, unspecified, unspecified cervical region: Secondary | ICD-10-CM | POA: Diagnosis not present

## 2022-06-15 DIAGNOSIS — Z79899 Other long term (current) drug therapy: Secondary | ICD-10-CM | POA: Diagnosis not present

## 2022-06-15 DIAGNOSIS — R310 Gross hematuria: Secondary | ICD-10-CM | POA: Diagnosis present

## 2022-06-15 DIAGNOSIS — M47816 Spondylosis without myelopathy or radiculopathy, lumbar region: Secondary | ICD-10-CM | POA: Diagnosis not present

## 2022-06-15 DIAGNOSIS — Z87442 Personal history of urinary calculi: Secondary | ICD-10-CM

## 2022-06-15 DIAGNOSIS — Z823 Family history of stroke: Secondary | ICD-10-CM | POA: Diagnosis not present

## 2022-06-15 DIAGNOSIS — Z83438 Family history of other disorder of lipoprotein metabolism and other lipidemia: Secondary | ICD-10-CM | POA: Diagnosis not present

## 2022-06-15 DIAGNOSIS — E118 Type 2 diabetes mellitus with unspecified complications: Secondary | ICD-10-CM | POA: Diagnosis present

## 2022-06-15 DIAGNOSIS — Z7984 Long term (current) use of oral hypoglycemic drugs: Secondary | ICD-10-CM

## 2022-06-15 DIAGNOSIS — N3289 Other specified disorders of bladder: Secondary | ICD-10-CM | POA: Diagnosis not present

## 2022-06-15 DIAGNOSIS — Z818 Family history of other mental and behavioral disorders: Secondary | ICD-10-CM | POA: Diagnosis not present

## 2022-06-15 DIAGNOSIS — J449 Chronic obstructive pulmonary disease, unspecified: Secondary | ICD-10-CM | POA: Diagnosis present

## 2022-06-15 DIAGNOSIS — F324 Major depressive disorder, single episode, in partial remission: Secondary | ICD-10-CM | POA: Diagnosis not present

## 2022-06-15 DIAGNOSIS — J410 Simple chronic bronchitis: Secondary | ICD-10-CM | POA: Diagnosis not present

## 2022-06-15 DIAGNOSIS — R404 Transient alteration of awareness: Secondary | ICD-10-CM | POA: Diagnosis not present

## 2022-06-15 LAB — CBC WITH DIFFERENTIAL/PLATELET
Abs Immature Granulocytes: 0.01 10*3/uL (ref 0.00–0.07)
Basophils Absolute: 0 10*3/uL (ref 0.0–0.1)
Basophils Relative: 0 %
Eosinophils Absolute: 0.4 10*3/uL (ref 0.0–0.5)
Eosinophils Relative: 9 %
HCT: 22.9 % — ABNORMAL LOW (ref 36.0–46.0)
Hemoglobin: 7.3 g/dL — ABNORMAL LOW (ref 12.0–15.0)
Immature Granulocytes: 0 %
Lymphocytes Relative: 34 %
Lymphs Abs: 1.6 10*3/uL (ref 0.7–4.0)
MCH: 29.6 pg (ref 26.0–34.0)
MCHC: 31.9 g/dL (ref 30.0–36.0)
MCV: 92.7 fL (ref 80.0–100.0)
Monocytes Absolute: 0.3 10*3/uL (ref 0.1–1.0)
Monocytes Relative: 7 %
Neutro Abs: 2.3 10*3/uL (ref 1.7–7.7)
Neutrophils Relative %: 50 %
Platelets: 279 10*3/uL (ref 150–400)
RBC: 2.47 MIL/uL — ABNORMAL LOW (ref 3.87–5.11)
RDW: 14 % (ref 11.5–15.5)
WBC: 4.7 10*3/uL (ref 4.0–10.5)
nRBC: 0 % (ref 0.0–0.2)

## 2022-06-15 LAB — BASIC METABOLIC PANEL WITH GFR
Anion gap: 11 (ref 5–15)
BUN: 19 mg/dL (ref 8–23)
CO2: 24 mmol/L (ref 22–32)
Calcium: 9.5 mg/dL (ref 8.9–10.3)
Chloride: 103 mmol/L (ref 98–111)
Creatinine, Ser: 0.77 mg/dL (ref 0.44–1.00)
GFR, Estimated: >60 mL/min
Glucose, Bld: 119 mg/dL — ABNORMAL HIGH (ref 70–99)
Potassium: 4 mmol/L (ref 3.5–5.1)
Sodium: 138 mmol/L (ref 135–145)

## 2022-06-15 LAB — URINALYSIS, ROUTINE W REFLEX MICROSCOPIC
RBC / HPF: >50 RBC/hpf — ABNORMAL HIGH (ref 0–5)
Specific Gravity, Urine: 1.022 (ref 1.005–1.030)
Squamous Epithelial / HPF: NONE SEEN (ref 0–5)
WBC, UA: >50 WBC/hpf — ABNORMAL HIGH (ref 0–5)

## 2022-06-15 LAB — GLUCOSE, CAPILLARY: Glucose-Capillary: 89 mg/dL (ref 70–99)

## 2022-06-15 LAB — PREPARE RBC (CROSSMATCH)

## 2022-06-15 MED ORDER — UMECLIDINIUM BROMIDE 62.5 MCG/ACT IN AEPB
1.0000 | INHALATION_SPRAY | Freq: Every day | RESPIRATORY_TRACT | Status: DC
  Administered 2022-06-16 – 2022-06-18 (×3): 1 via RESPIRATORY_TRACT
  Filled 2022-06-15: qty 7

## 2022-06-15 MED ORDER — ATORVASTATIN CALCIUM 20 MG PO TABS
40.0000 mg | ORAL_TABLET | Freq: Every day | ORAL | Status: DC
  Administered 2022-06-15 – 2022-06-18 (×4): 40 mg via ORAL
  Filled 2022-06-15 (×4): qty 2

## 2022-06-15 MED ORDER — DOCUSATE SODIUM 100 MG PO CAPS
100.0000 mg | ORAL_CAPSULE | Freq: Two times a day (BID) | ORAL | Status: DC | PRN
Start: 2022-06-15 — End: 2022-06-18

## 2022-06-15 MED ORDER — ALBUTEROL SULFATE (2.5 MG/3ML) 0.083% IN NEBU: 3.0000 mL | INHALATION_SOLUTION | RESPIRATORY_TRACT | Status: DC | PRN

## 2022-06-15 MED ORDER — ASPIRIN 81 MG PO TBEC
81.0000 mg | DELAYED_RELEASE_TABLET | Freq: Every day | ORAL | Status: DC
  Administered 2022-06-16 – 2022-06-17 (×2): 81 mg via ORAL
  Filled 2022-06-15 (×2): qty 1

## 2022-06-15 MED ORDER — SODIUM CHLORIDE 0.9 % IR SOLN
3000.0000 mL | Status: DC
  Administered 2022-06-15 (×2): 3000 mL

## 2022-06-15 MED ORDER — NICOTINE 21 MG/24HR TD PT24
21.0000 mg | MEDICATED_PATCH | Freq: Every day | TRANSDERMAL | Status: DC
  Administered 2022-06-15 – 2022-06-18 (×4): 21 mg via TRANSDERMAL
  Filled 2022-06-15 (×4): qty 1

## 2022-06-15 MED ORDER — BUPROPION HCL ER (XL) 150 MG PO TB24
150.0000 mg | ORAL_TABLET | Freq: Every day | ORAL | Status: DC
  Administered 2022-06-15 – 2022-06-18 (×4): 150 mg via ORAL
  Filled 2022-06-15 (×4): qty 1

## 2022-06-15 MED ORDER — SODIUM CHLORIDE 0.9 % IV SOLN
10.0000 mL/h | Freq: Once | INTRAVENOUS | Status: AC
  Administered 2022-06-15: 10 mL/h via INTRAVENOUS

## 2022-06-15 MED ORDER — FAMOTIDINE 20 MG PO TABS
20.0000 mg | ORAL_TABLET | Freq: Every day | ORAL | Status: DC
  Administered 2022-06-15 – 2022-06-17 (×3): 20 mg via ORAL
  Filled 2022-06-15 (×3): qty 1

## 2022-06-15 MED ORDER — CYCLOBENZAPRINE HCL 10 MG PO TABS
5.0000 mg | ORAL_TABLET | Freq: Three times a day (TID) | ORAL | Status: DC | PRN
Start: 2022-06-15 — End: 2022-06-18

## 2022-06-15 MED ORDER — TRAZODONE HCL 50 MG PO TABS
50.0000 mg | ORAL_TABLET | Freq: Every evening | ORAL | Status: DC | PRN
  Administered 2022-06-15 – 2022-06-17 (×3): 50 mg via ORAL
  Filled 2022-06-15 (×3): qty 1

## 2022-06-15 MED ORDER — ACETAMINOPHEN 325 MG PO TABS
650.0000 mg | ORAL_TABLET | Freq: Three times a day (TID) | ORAL | Status: DC
  Administered 2022-06-15 – 2022-06-18 (×8): 650 mg via ORAL
  Filled 2022-06-15 (×8): qty 2

## 2022-06-15 MED ORDER — FLUTICASONE FUROATE-VILANTEROL 100-25 MCG/ACT IN AEPB
1.0000 | INHALATION_SPRAY | Freq: Every day | RESPIRATORY_TRACT | Status: DC
  Administered 2022-06-16 – 2022-06-18 (×3): 1 via RESPIRATORY_TRACT
  Filled 2022-06-15: qty 28

## 2022-06-15 MED ORDER — INFLUENZA VAC A&B SA ADJ QUAD 0.5 ML IM PRSY
0.5000 mL | PREFILLED_SYRINGE | INTRAMUSCULAR | Status: DC
  Filled 2022-06-15: qty 0.5

## 2022-06-15 NOTE — ED Notes (Signed)
First Nurse Note: Patient to ED via [facility] from home for blood clots in urine x1 month. Patient was seen for same and admitted at [facility]. Patient had a near syncopal episode this AM. Hx of stroke x4 and COPD.   119/78 62 HR 167 cbg

## 2022-06-15 NOTE — ED Notes (Signed)
Placed on cardiac, BP and oxygen monitor by EDT.

## 2022-06-15 NOTE — Telephone Encounter (Addendum)
  Chief Complaint:   Had an appt this morning but did not come due  to "I'm feeling so weak and staggering around".  "I couldn't come feeling like that". Symptoms: Also having blood in her urine now passing blood clots for a month Frequency: Blood in urine every time she urinates. Pertinent Negatives: Patient denies having bleeding from her rectum. Disposition: '[x]'$ ED /'[]'$ Urgent Care (no appt availability in office) / '[]'$ Appointment(In office/virtual)/ '[]'$  [address] [facility]/ '[]'$ Home Care/ '[]'$ Refused Recommended Disposition /'[]'$ [facility] [facility]/ '[]'$  Follow-up with PCP Additional Notes: I have referred her to the ED.   She is agreeable to going however she doesn't have anyone to take her so I instructed her to  call 911.    I called into [facility] and spoke with Apolonio Schneiders.   I let her know I have referred this pt to the ED.   The office manager just got off the phone with the pt.  She missed her appt. This morning and has missed several appts.

## 2022-06-15 NOTE — Progress Notes (Signed)
Surgical Physician Order Form [facility]  * Scheduling expectation :  06/23/2022  *Length of Case: 1.5 hours  *Clearance needed: no  *Anticoagulation Instructions:  Hold Plavix 5 days(last dose 9/27), okay to continue 81 mg aspirin  *Aspirin Instructions: Hold Plavix ok to continue ASA '81mg'$   *Post-op visit Date/Instructions:  1-2 week with pathology review  *Diagnosis: Bladder Tumor  *Procedure:     TURBT 2-5cm (03159)   Additional orders: Gemcitabine '2000mg'$  bladder instillation  -Admit type: OUTpatient  -Anesthesia: General  -VTE Prophylaxis Standing Order SCD's       Other:   -Standing Lab Orders Per Anesthesia    Lab other: UA&Urine Culture (sent 9/28)  -Standing Test orders EKG/Chest x-ray per Anesthesia       Test other:   - Medications:  Ancef 2gm IV  -Other orders:  N/A  -Patient has no showed multiple appointments, please stress the importance of keeping scheduled follow-up for surgery, otherwise she will continue to have ER visits for bleeding

## 2022-06-15 NOTE — Assessment & Plan Note (Addendum)
Counseled, continue nicotine transdermal patch 21 mg daily

## 2022-06-15 NOTE — Assessment & Plan Note (Addendum)
Secondary to gross hematuria most likely related to bladder mass concerning for malignancy with concomitant dual antiplatelet use. Hold  Plavix, continue aspirin Status post transfusional 1 unit of packed RBC Check serial H&H.  Transfuse if hemoglobin drops less than 7

## 2022-06-15 NOTE — Assessment & Plan Note (Addendum)
Patient with a history of a prior stroke with left-sided hemiparesis. Hold Plavix due to symptomatic acute blood loss anemia from hematuria and planned procedure as an outpatient.  Continue aspirin -urology is okay with continuing aspirin Continue atorvastatin

## 2022-06-15 NOTE — ED Triage Notes (Signed)
Hematuria x 30 days , pt has been seen here for same problem

## 2022-06-15 NOTE — Telephone Encounter (Signed)
PT'S PHONE NUMBER WAS WRONG IN THE CHART!!!   PHONE NUMBER HAS UPDATED!!

## 2022-06-15 NOTE — Consult Note (Signed)
Urology Consult   I have been asked to see the patient by Dr. Cheri Fowler, for evaluation and management of gross hematuria, bladder tumor on CT.  Chief Complaint: Gross hematuria  HPI:  Kendra [name] is a 76 y.o. female with a number of comorbidities including diabetes, history of stroke on Plavix(last dose 9/20 7 PM) who has had at least 5-6 months of gross hematuria.  She was seen by Dr. Bernardo Heater in urology clinic on 02/09/2022 and CT was concerning for 2 bladder lesions concerning for bladder cancer(2 cm and 1cm), and the likely etiology for gross hematuria.  She was scheduled for cystoscopy but never followed up.  She has missed multiple appointments with PCP and oncology as well.  She was seen in the ER again on 05/23/2022 with recurrent hematuria, and CT urogram showed continued growth in the bladder lesions, measuring 2 cm each.  Urine culture was negative.  She presents again today with ongoing gross hematuria and clots.  She is voiding spontaneously, no evidence of residual clot or incomplete emptying on CT stone protocol today.  She has had a drop in hematocrit to 22.9 from 32.7 3 weeks ago.  She denies any lower abdominal pain, mild dysuria with passage of clots, no flank pain.  She is 1/2 pack a day smoker.  Hematuria has resolved spontaneously previously when she held Plavix.  PMH: Past Medical History:  Diagnosis Date   Asthma    Back pain    Cataract    Cervical disc disorder    COPD (chronic obstructive pulmonary disease) (HCC)    Depression    Diabetes (HCC)    type II   Diabetic neuropathy (HCC)    Dyspnea    with exertion   GERD (gastroesophageal reflux disease)    History of kidney stones    per patient "can't remember the year"   Hyperlipidemia    Hypertension    per patient "take meds for it"   Insomnia    Neuropathy    Osteoarthritis    Osteopenia    Pneumonia 2018   per patient   Reflux    Rheumatoid arthritis ([place])    Stroke ([place]) 2015   and again  2017  - Weakness in left leg, staggers w/ walking, vision    Tenosynovitis     Surgical History: Past Surgical History:  Procedure Laterality Date   BREAST BIOPSY Right    Fatty Tumor   ENDARTERECTOMY Right 10/21/2019   Procedure: ENDARTERECTOMY CAROTID RIGHT;  Surgeon: [name], MD;  Location: [facility];  Service: Vascular;  Laterality: Right;   ENDARTERECTOMY Left 12/30/2019   Procedure: LEFT CAROTID ENDARTERECTOMY;  Surgeon: [name], MD;  Location: [facility];  Service: Vascular;  Laterality: Left;   NECK SURGERY     OVARIAN CYST REMOVAL     PATCH ANGIOPLASTY Right 10/21/2019   Procedure: Patch Angioplasty Using XenoSure Biologic Patch Right Carotid;  Surgeon: [name], MD;  Location: [facility];  Service: Vascular;  Laterality: Right;   PATCH ANGIOPLASTY Left 12/30/2019   Procedure: Patch Angioplasty Left Carotid;  Surgeon: [name], MD;  Location: [facility];  Service: Vascular;  Laterality: Left;    Allergies:  Allergies  Allergen Reactions   Citalopram Nausea And Vomiting    Family History: Family History  Problem Relation Age of Onset   Stroke Father    Depression Father    Diabetes Mother    Hyperlipidemia Mother    Breast  cancer Mother    Heart murmur Son    Heart murmur Son    Heart murmur Son     Social History:  reports that she has been smoking cigarettes. She has a 15.25 pack-year smoking history. She has quit using smokeless tobacco.  Her smokeless tobacco use included snuff. She reports that she does not drink alcohol and does not use drugs.  ROS: Negative aside from those stated in the HPI.  Physical Exam: BP 110/72 (BP Location: Right Arm)   Pulse 90   Temp 98.5 F (36.9 C) (Oral)   Resp 18   Ht $R'5\' 5"'Qo$  (1.651 m)   Wt 50 kg   SpO2 100%   BMI 18.34 kg/m    Constitutional:  Alert and oriented, No acute distress. Cardiovascular: No clubbing, cyanosis, or edema. Respiratory: Normal respiratory effort,  no increased work of breathing. GI: Abdomen is soft, nontender, nondistended, no abdominal masses  Laboratory Data: Reviewed in epic Hematocrit 22.9(32.7) Creatinine 0.77, EGFR greater than 60 Urinalysis grossly contaminated with blood, greater than 50 RBCs, greater than 50 WBC, few bacteria, sent for culture, urine culture 9/5 no growth  Pertinent Imaging: I have personally reviewed the multiple CT scans, including the most recent CT from today. Two separate bladder lesions concerning for malignancy measuring approximately 2 cm each. No evidence of metastatic disease.  Assessment & Plan:   76 year old female with 5 to 6 months of gross hematuria, known bladder lesion since May 2023 who has not followed up and has had multiple ER visits for gross hematuria likely secondary to bladder tumor seen on CT.  On Plavix for history of stroke, with most recent dose last night 9/27.  She denies any lower abdominal pain and is voiding spontaneously with clots, drop in hematocrit to 22.9 from 32.7.  Multiple social/transportation issues have made care challenging, and she has been lost to follow-up numerous times with multiple ER visits.  I reviewed at length with her her CT findings and likely diagnosis of bladder cancer, with need for TURBT for both diagnosis, staging, and treatment. We discussed transurethral resection of bladder tumor (TURBT) and risks and benefits at length. This is typically a 1 to 2-hour procedure done under general anesthesia in the operating room.  A scope is inserted through the urethra and used to resect abnormal tissue within the bladder, which is then sent to the pathologist to determine grade and stage of the tumor.  Risks include bleeding, infection, need for temporary Foley placement, and bladder perforation.  Treatment strategies are based on the type of tumor and depth of invasion.  We briefly reviewed the different treatment pathways for non-muscle invasive and muscle invasive  bladder cancer.  We also discussed that I would not recommend TURBT acutely in the setting of Plavix use with the high risk of bleeding.  She continues to void spontaneously, no evidence of significant clot in the bladder or incomplete emptying on CT today.  Agree with admission for transfusion, hold Plavix, and we will plan to schedule TURBT next week.  Can discharge from a urology perspective in the next 1 to 2 days if hematocrit stable after transfusion.  Recommendations:  -Agree with admission to hospitalist for transfusion and repeat H/H tomorrow morning -Avoid Foley catheter as patient continues to void spontaneously and no evidence of incomplete emptying or clot retention -Send urine for culture, though urine culture 05/23/2022 no growth -Hold Plavix, anticipate resuming 2 to 3 days after surgery next week.  Okay to continue  $'81mg'k$  aspirin. -Will coordinate TURBT(transurethral resection of bladder tumor) next week as outpatient -If hematocrit stable, okay for discharge from a urology perspective, she has had ongoing gross hematuria over the last 5 months which has resolved previously by holding Plavix  Billey Co, MD  Total time spent on the floor was 65 minutes, with greater than 50% spent in counseling and coordination of care with the patient regarding CT findings of suspected bladder cancer as primary etiology for ongoing gross hematuria, need to hold Plavix prior to any surgical intervention.  [place] [address], [place], [place] [address] [phone]

## 2022-06-15 NOTE — H&P (Signed)
History and Physical    Patient: Kendra [name] KCM:[id number] DOB: Mar 05, 1946 DOA: 06/15/2022 DOS: the patient was seen and examined on 06/15/2022 PCP: [name], NP  Patient coming from: Home  Chief Complaint:  Chief Complaint  Patient presents with   Hematuria   HPI: Kendra [name] is a 76 y.o. female with medical history significant for CVA with left-sided residual weakness, type 2 diabetes mellitus, hypertension, nicotine dependence, COPD, rheumatoid arthritis who presents to the emergency room for the second time this month for evaluation of gross hematuria with clots. She was seen in the ER on 09/25 for evaluation of gross hematuria and fatigue.  Her hemoglobin during that encounter was 10.5.  She had a CT scan of abdomen and pelvis with contrast which showed two enhancing lobulated polypoid lesions in the urinary bladder highly suspicious for neoplasm, largest measuring 2 cm. The more inferior lesion has enlarged from prior exam. Cystoscopy again suggested.  Patient was discharged home to follow-up with urology as an outpatient. She presents again 3 weeks later with persistent hematuria associated with passage of large clots, increased fatigue and weakness as well as lightheadedness with several falls over the last 1 week.  She has had significant weight loss over the last 3 months and very poor appetite.  She denies having any abdominal pain or changes in her bowel habits and denies having any fever, no chills, no headache, no blurred vision, no chest pain, no shortness of breath, no leg swelling, no blurred vision no focal deficit. Hemoglobin today is 7.3 down from 10.5, 3 weeks ago. 1 unit of packed RBC has been ordered and she will be admitted to the hospital for further evaluation Urology consult has been requested as well     Review of Systems: As mentioned in the history of present illness. All other systems reviewed and are negative. Past Medical History:   Diagnosis Date   Asthma    Back pain    Cataract    Cervical disc disorder    COPD (chronic obstructive pulmonary disease) (HCC)    Depression    Diabetes (HCC)    type II   Diabetic neuropathy (HCC)    Dyspnea    with exertion   GERD (gastroesophageal reflux disease)    History of kidney stones    per patient "can't remember the year"   Hyperlipidemia    Hypertension    per patient "take meds for it"   Insomnia    Neuropathy    Osteoarthritis    Osteopenia    Pneumonia 2018   per patient   Reflux    Rheumatoid arthritis ([place])    Stroke ([place]) 2015   and again 2017  - Weakness in left leg, staggers w/ walking, vision    Tenosynovitis    Past Surgical History:  Procedure Laterality Date   BREAST BIOPSY Right    Fatty Tumor   ENDARTERECTOMY Right 10/21/2019   Procedure: ENDARTERECTOMY CAROTID RIGHT;  Surgeon: [name], MD;  Location: [place];  Service: Vascular;  Laterality: Right;   ENDARTERECTOMY Left 12/30/2019   Procedure: LEFT CAROTID ENDARTERECTOMY;  Surgeon: [name], MD;  Location: [place];  Service: Vascular;  Laterality: Left;   NECK SURGERY     OVARIAN CYST REMOVAL     PATCH ANGIOPLASTY Right 10/21/2019   Procedure: Patch Angioplasty Using XenoSure Biologic Patch Right Carotid;  Surgeon: [name], MD;  Location: [place];  Service: Vascular;  Laterality: Right;  PATCH ANGIOPLASTY Left 12/30/2019   Procedure: Patch Angioplasty Left Carotid;  Surgeon: [name], MD;  Location: [place];  Service: Vascular;  Laterality: Left;   Social History:  reports that she has been smoking cigarettes. She has a 15.25 pack-year smoking history. She has quit using smokeless tobacco.  Her smokeless tobacco use included snuff. She reports that she does not drink alcohol and does not use drugs.  Allergies  Allergen Reactions   Citalopram Nausea And Vomiting    Family History  Problem Relation Age of Onset   Stroke Father     Depression Father    Diabetes Mother    Hyperlipidemia Mother    Breast cancer Mother    Heart murmur Son    Heart murmur Son    Heart murmur Son     Prior to Admission medications   Medication Sig Start Date End Date Taking? Authorizing Provider  aspirin EC 81 MG tablet Take 1 tablet (81 mg total) by mouth daily. Swallow whole. 07/18/21  Yes [name], NP  atorvastatin (LIPITOR) 40 MG tablet TAKE 1 TABLET BY MOUTH BEDTIME 04/13/22  Yes [name], Coralie Keens, NP  buPROPion (WELLBUTRIN XL) 150 MG 24 hr tablet TAKE 1 TABLET BY MOUTH ONCE DAILY 04/13/22  Yes [name], Coralie Keens, NP  cephALEXin (KEFLEX) 500 MG capsule Take 1 capsule (500 mg total) by mouth 2 (two) times daily. 05/23/22  Yes Delman Kitten, MD  clopidogrel (PLAVIX) 75 MG tablet Take 1 tablet (75 mg total) by mouth daily. 04/25/22  Yes [name], Coralie Keens, NP  cyclobenzaprine (FLEXERIL) 5 MG tablet TAKE 1 TABLET BY MOUTH 3 TIMES DAILY AS NEEDED FOR MUSCLE SPASMS 05/30/22  Yes [name], NP  famotidine (PEPCID) 20 MG tablet Take 1 tablet (20 mg total) by mouth 2 (two) times daily. 04/03/22  Yes Paulette Blanch, MD  metFORMIN (GLUCOPHAGE) 500 MG tablet TAKE 1 TABLET BY MOUTH TWICE DAILY WITH A MEAL 04/13/22  Yes [name], NP  traZODone (DESYREL) 50 MG tablet TAKE 1/2 TO 1 TABLET BY MOUTH AT [name] NEEDED SLEEP 04/13/22  Yes [name], NP  TRELEGY ELLIPTA 100-62.5-25 MCG/ACT AEPB USE ONE INHALATION INTO THE LUNGS ONCE A DAY RINSE MOUTH AFTER EACH USE 04/20/22  Yes [name], NP  Accu-Chek Softclix Lancets lancets USE AS DIRECTED. UP TO 4 TIMES A DAY DX: E11.9 05/09/22   [name], NP  Acetaminophen (TYLENOL 8 HOUR ARTHRITIS PAIN PO) Take 2 capsules by mouth in the morning, at noon, in the evening, and at bedtime.    [provider]  albuterol (VENTOLIN HFA) 108 (90 Base) MCG/ACT inhaler INHALE 1-2 PUFFS BY MOUTH EVERY 6 HOURS AS NEEDED WHEEZING/ SHORTNESS OF BREATH 05/26/22   [name], NP  blood glucose meter  kit and supplies KIT Dispense based on patient and insurance preference. Use up to four times daily as directed. 08/08/21   [name], NP  Blood Glucose Monitoring Suppl [name] VERIO) w/Device KIT USE AS DIRECTED 04/05/20   Malfi, [name], FNP  docusate sodium (COLACE) 100 MG capsule Take 1 capsule (100 mg total) by mouth 2 (two) times daily as needed for mild constipation. Patient not taking: Reported on 06/15/2022 06/20/21   [name], NP  glucose blood (ACCU-CHEK GUIDE) test strip Use as directed up to 4 times daily. DX: E11.9 05/09/22   [name], NP  Lancet Devices (ONE TOUCH DELICA LANCING DEV) MISC 1 Device by  Does not apply route 2 (two) times daily. 01/02/20   [name], FNP    Physical Exam: Vitals:   06/15/22 1219 06/15/22 1220  BP: 110/72   Pulse: 90   Resp: 18   Temp: 98.5 F (36.9 C)   TempSrc: Oral   SpO2: 100%   Weight:  50 kg  Height:  $Remove'5\' 5"'JhCtvCj$  (1.651 m)   Physical Exam Vitals and nursing note reviewed.  Constitutional:      Comments: Thin, chronically ill-appearing  HENT:     Head: Normocephalic and atraumatic.     Nose: Nose normal.     Mouth/Throat:     Mouth: Mucous membranes are moist.  Eyes:     Comments: Pale conjunctiva  Cardiovascular:     Rate and Rhythm: Normal rate and regular rhythm.  Pulmonary:     Effort: Pulmonary effort is normal.     Breath sounds: Normal breath sounds.  Abdominal:     General: Abdomen is flat.     Palpations: Abdomen is soft.     Tenderness: There is abdominal tenderness.     Comments: Tenderness over the suprapubic area  Musculoskeletal:        General: Normal range of motion.     Cervical back: Normal range of motion and neck supple.  Skin:    General: Skin is warm and dry.  Neurological:     Motor: Weakness present.  Psychiatric:        Mood and Affect: Mood normal.        Behavior: Behavior normal.     Data Reviewed: Relevant notes from primary care and specialist visits, past  discharge summaries as available in EHR, including Care Everywhere. Prior diagnostic testing as pertinent to current admission diagnoses Updated medications and problem lists for reconciliation ED course, including vitals, labs, imaging, treatment and response to treatment Triage notes, nursing and pharmacy notes and ED provider's notes Notable results as noted in HPI Labs reviewed. Sodium 138, potassium 4.0, chloride 103, bicarb 24, glucose 119, BUN 19, creatinine 0.77, calcium 9.5, white count 4.7, hemoglobin 7.3 compared to baseline of 10.5, hematocrit 22.9, platelet count 279 Renal stone CT shows nonobstructing BILATERAL renal calculi. Soft tissue masses again identified at bladder base 19 x 16 x 14 mm and at LEFT anterolateral bladder wall 20 x 14 x 10 mm, highly suspicious for bladder neoplasms; recommend correlation with cystoscopy.Infrarenal abdominal aortic aneurysm 4.0 x 3.7 cm extending approximately 3.8 cm length, unchanged; There are no new results to review at this time.   Assessment and Plan: * ABLA (acute blood loss anemia) Secondary to gross hematuria most likely related to bladder mass concerning for malignancy with concomitant dual antiplatelet use. Hold aspirin and Plavix Transfuse 1 unit of packed RBC Check serial H&H  Hematuria Patient presents for evaluation of gross hematuria and imaging shows a soft tissue mass in the bladder concerning for malignancy. Consult urology CBI Check serial H&H and transfuse as needed  COPD (chronic obstructive pulmonary disease) (HCC) Stable and not acutely exacerbated Continue as needed bronchodilator therapy as well as inhaled steroids  Hemiparesis affecting left side as late effect of stroke [facility]) Patient with a history of a prior stroke with left-sided hemiparesis. Hold aspirin and Plavix due to symptomatic acute blood loss anemia Continue atorvastatin  Diabetes mellitus type 2, controlled, with complications (HCC) Diet  controlled Maintain consistent carbohydrate diet Check blood sugars with meals  Nicotine dependence Smoking cessation has been discussed with patient in detail We  will place patient on a nicotine transdermal patch 21 mg daily  Gastro-esophageal reflux disease without esophagitis Stable Continue Pepcid  Depression Continue bupropion and trazodone      Advance Care Planning:   Code Status: Prior   Consults: Urology  Family Communication: Greater than 50% of time was spent discussing patient's condition and plan of care with her at the bedside.  All questions and concerns have been addressed.  She verbalizes understanding and agrees to the plan.  Severity of Illness: The appropriate patient status for this patient is INPATIENT. Inpatient status is judged to be reasonable and necessary in order to provide the required intensity of service to ensure the patient's safety. The patient's presenting symptoms, physical exam findings, and initial radiographic and laboratory data in the context of their chronic comorbidities is felt to place them at high risk for further clinical deterioration. Furthermore, it is not anticipated that the patient will be medically stable for discharge from the hospital within 2 midnights of admission.   * I certify that at the point of admission it is my clinical judgment that the patient will require inpatient hospital care spanning beyond 2 midnights from the point of admission due to high intensity of service, high risk for further deterioration and high frequency of surveillance required.*  Author: Collier Bullock, MD 06/15/2022 2:51 PM  For on call review [email or web address].

## 2022-06-15 NOTE — Assessment & Plan Note (Addendum)
Continue bupropion and trazodone.

## 2022-06-15 NOTE — Assessment & Plan Note (Addendum)
Stable and not acutely exacerbated Continue as needed bronchodilator therapy as well as inhaled steroids 

## 2022-06-15 NOTE — ED Provider Triage Note (Signed)
Emergency Medicine Provider Triage Evaluation Note  Kendra [name] , a 76 y.o. female  was evaluated in triage.  Pt complains of hematuria.  Patient has history of hematuria.  Is followed by urologist.  Has not appointment next month.  Patient states today it is burning and stinging..  Review of Systems  Positive: Burning with urination, hematuria Negative: Fever  Physical Exam  BP 110/72 (BP Location: Right Arm)   Pulse 90   Temp 98.5 F (36.9 C) (Oral)   Resp 18   Ht '5\' 5"'$  (1.651 m)   Wt 50 kg   SpO2 100%   BMI 18.34 kg/m  Gen:   Awake, no distress   Resp:  Normal effort  MSK:   Moves extremities without difficulty  Other:    Medical Decision Making  Medically screening exam initiated at 12:22 PM.  Appropriate orders placed.  Kendra [name] was informed that the remainder of the evaluation will be completed by another provider, this initial triage assessment does not replace that evaluation, and the importance of remaining in the ED until their evaluation is complete.  Basic labs, CT renal stone to hematuria flank pain   Kendra Starks, PA-C 06/15/22 1222

## 2022-06-15 NOTE — Assessment & Plan Note (Addendum)
Continue Pepcid  

## 2022-06-15 NOTE — Assessment & Plan Note (Addendum)
Evaluated by urology.  Foley discontinued and CBI stopped per urology recommendation.  TURBT next Friday for management of 2 large bladder tumors that are highly concerning for bladder cancer and are the very likely source of her ongoing hematuria and blood loss anemia.  Holding Plavix

## 2022-06-15 NOTE — Assessment & Plan Note (Addendum)
Diet controlled Maintain consistent carbohydrate diet

## 2022-06-15 NOTE — ED Provider Notes (Signed)
[facility] Provider Note   Event Date/Time   First MD Initiated Contact with Patient 06/15/22 1301     (approximate) History  Hematuria  HPI Kendra [name] is a 76 y.o. female with a past medical history of recent diagnosis on 9 5 of a bladder mass who presents for continued gross hematuria as well as orthostatic lightheadedness and dyspnea on exertion.  Patient states that she was diagnosed recently with this bladder mass but has been unable to follow-up at her oncology and urology appointments secondary to "transportation issues". ROS: Patient currently denies any vision changes, tinnitus, difficulty speaking, facial droop, sore throat, chest pain, shortness of breath, abdominal pain, nausea/vomiting/diarrhea, or weakness/numbness/paresthesias in any extremity   Physical Exam  Triage Vital Signs: ED Triage Vitals  Enc Vitals Group     BP 06/15/22 1219 110/72     Pulse Rate 06/15/22 1219 90     Resp 06/15/22 1219 18     Temp 06/15/22 1219 98.5 F (36.9 C)     Temp Source 06/15/22 1219 Oral     SpO2 06/15/22 1219 100 %     Weight 06/15/22 1220 110 lb 3.7 oz (50 kg)     Height 06/15/22 1220 '5\' 5"'$  (1.651 m)     Head Circumference --      Peak Flow --      Pain Score 06/15/22 1220 3     Pain Loc --      Pain Edu? --      Excl. in [place]? --    Most recent vital signs: Vitals:   06/15/22 1219  BP: 110/72  Pulse: 90  Resp: 18  Temp: 98.5 F (36.9 C)  SpO2: 100%   General: Awake, oriented x4. CV:  Good peripheral perfusion.  Resp:  Normal effort.  Abd:  No distention.  Other:  Elderly African-American female laying in bed in no acute distress ED Results / Procedures / Treatments  Labs (all labs ordered are listed, but only abnormal results are displayed) Labs Reviewed  BASIC METABOLIC PANEL - Abnormal; Notable for the following components:      Result Value   Glucose, Bld 119 (*)    All other components within normal limits  CBC WITH  DIFFERENTIAL/PLATELET - Abnormal; Notable for the following components:   RBC 2.47 (*)    Hemoglobin 7.3 (*)    HCT 22.9 (*)    All other components within normal limits  URINALYSIS, ROUTINE W REFLEX MICROSCOPIC - Abnormal; Notable for the following components:   Color, Urine RED (*)    APPearance TURBID (*)    Glucose, UA   (*)    Value: TEST NOT REPORTED DUE TO COLOR INTERFERENCE OF URINE PIGMENT   Hgb urine dipstick   (*)    Value: TEST NOT REPORTED DUE TO COLOR INTERFERENCE OF URINE PIGMENT   Bilirubin Urine   (*)    Value: TEST NOT REPORTED DUE TO COLOR INTERFERENCE OF URINE PIGMENT   Ketones, ur   (*)    Value: TEST NOT REPORTED DUE TO COLOR INTERFERENCE OF URINE PIGMENT   Protein, ur   (*)    Value: TEST NOT REPORTED DUE TO COLOR INTERFERENCE OF URINE PIGMENT   Nitrite   (*)    Value: TEST NOT REPORTED DUE TO COLOR INTERFERENCE OF URINE PIGMENT   Leukocytes,Ua   (*)    Value: TEST NOT REPORTED DUE TO COLOR INTERFERENCE OF URINE PIGMENT   RBC / HPF >50 (*)  WBC, UA >50 (*)    Bacteria, UA FEW (*)    All other components within normal limits  RADIOLOGY ED MD interpretation: CT of the abdomen and pelvis without IV contrast interpreted by me and shows a nonobstructing bilateral renal calculi as well as soft tissue masses in the bladder that are once again characterized as similar to previous study -Agree with radiology assessment Official radiology report(s): CT Renal Stone Study  Result Date: 06/15/2022 CLINICAL DATA:  Flank pain, hematuria, suspected kidney stone EXAM: CT ABDOMEN AND PELVIS WITHOUT CONTRAST TECHNIQUE: Multidetector CT imaging of the abdomen and pelvis was performed following the standard protocol without IV contrast. RADIATION DOSE REDUCTION: This exam was performed according to the departmental dose-optimization program which includes automated exposure control, adjustment of the mA and/or kV according to patient size and/or use of iterative reconstruction  technique. COMPARISON:  05/23/2022 FINDINGS: Lower chest: Lung bases appear emphysematous but clear Hepatobiliary: Gallbladder and liver normal appearance Pancreas: Normal appearance Spleen: Normal appearance Adrenals/Urinary Tract: Mild thickening of adrenal glands without discrete mass. Nonobstructing BILATERAL renal calculi. Scattered renal vascular calcifications. No renal mass, hydronephrosis or hydroureter. No ureteral calcifications. Soft tissue mass at bladder base 19 x 16 x 14 mm question bladder neoplasm. Additional less well-defined mass at LEFT anterolateral bladder 20 x 14 x 10 mm question additional bladder tumor. These are better defined on the prior contrast enhanced CT exam. Stomach/Bowel: Increased stool in colon. Normal appendix. Stomach and bowel loops otherwise normal appearance. Vascular/Lymphatic: Extensive atherosclerotic calcifications aorta, iliac arteries, femoral arteries, visceral arteries. Infrarenal abdominal aortic aneurysm 4.0 x 3.7 cm image 35 extending approximately 3.8 cm length. No surrounding infiltration or hemorrhage. No adenopathy. Reproductive: Unremarkable uterus and adnexa Other: No free air or free fluid.  No hernia. Musculoskeletal: Osseous demineralization with degenerative changes lumbar spine. IMPRESSION: Nonobstructing BILATERAL renal calculi. Soft tissue masses again identified at bladder base 19 x 16 x 14 mm and at LEFT anterolateral bladder wall 20 x 14 x 10 mm, highly suspicious for bladder neoplasms; recommend correlation with cystoscopy. Infrarenal abdominal aortic aneurysm 4.0 x 3.7 cm extending approximately 3.8 cm length, unchanged; Recommend follow-up every 12 months and vascular consultation. This recommendation follows ACR consensus guidelines: White Paper of the ACR Incidental Findings Committee II on Vascular Findings. J Am Coll Radiol 2013; 10:789-794. Recommend follow-up ultrasound every 6 months and vascular consultation. Emphysema (ICD10-J43.9).  Aortic Atherosclerosis (ICD10-I70.0). Aortic aneurysm NOS (ICD10-I71.9). Electronically Signed   By: Lavonia Dana M.D.   On: 06/15/2022 13:07   PROCEDURES: Critical Care performed: No .1-3 Lead EKG Interpretation  Performed by: Naaman Plummer, MD Authorized by: Naaman Plummer, MD     Interpretation: normal     ECG rate:  87   ECG rate assessment: normal     Rhythm: sinus rhythm     Ectopy: none     Conduction: normal   CRITICAL CARE Performed by: Naaman Plummer  Total critical care time: 39 minutes  Critical care time was exclusive of separately billable procedures and treating other patients.  Critical care was necessary to treat or prevent imminent or life-threatening deterioration.  Critical care was time spent personally by me on the following activities: development of treatment plan with patient and/or surrogate as well as nursing, discussions with consultants, evaluation of patient's response to treatment, examination of patient, obtaining history from patient or surrogate, ordering and performing treatments and interventions, ordering and review of laboratory studies, ordering and review of radiographic studies, pulse oximetry  and re-evaluation of patient's condition.  MEDICATIONS ORDERED IN ED: Medications - No data to display IMPRESSION / MDM / [place] / ED COURSE  I reviewed the triage vital signs and the nursing notes.                             The patient is on the cardiac monitor to evaluate for evidence of arrhythmia and/or significant heart rate changes. Patient's presentation is most consistent with acute presentation with potential threat to life or bodily function. Patient is a 76 year old female with the above-stated past medical history who presents with concerns for persistent bleeding from likely a bladder cancer as well as now symptomatic anemia.  Patient's hemoglobin has decreased to 7.3 today from 10.53 weeks ago.  Patient has gross blood on  urinalysis.  Patient CT scan redemonstrates bladder masses.  At this point I have low suspicion for renal stones, significant urinary tract infection, sepsis, or DIC.  I spoke to the on-call urologist, Dr. Diamantina Providence who agrees to follow this patient.  I spoke to the on-call hospitalist team who agrees to accept this patient for admission given symptomatic anemia and persistent hematuria.  Dispo: Admit to medicine   FINAL CLINICAL IMPRESSION(S) / ED DIAGNOSES   Final diagnoses:  None   Rx / DC Orders   ED Discharge Orders     None      Note:  This document was prepared using Dragon voice recognition software and may include unintentional dictation errors.   Naaman Plummer, MD 06/15/22 [phone]

## 2022-06-15 NOTE — Telephone Encounter (Signed)
Reason for Disposition  Passing pure blood or large blood clots (i.e., size > a dime)  (Exception: Fleck or small strands.)  Answer Assessment - Initial Assessment Questions 1. COLOR of URINE: "Describe the color of the urine."  (e.g., tea-colored, pink, red, bloody) "Do you have blood clots in your urine?" (e.g., none, pea, grape, small coin)     Had an appt. Today for 9:20.   Did not make it to the appt.   I'm weak and staggering a lot.   I just didn't feel like coming in because I'm so weak. 2. ONSET: "When did the bleeding start?"      Having blood in urine for a month.    When I pee it's a lot with blood clots or "clumps" in my pee. At this point I referred her to the ED.   She does not have anyone to take her so I instructed her to call 911.   She asked,  "How will I get home from the hospital?"    I let her know they could assist her with that if she didn't have anyone she could call but her health was the main concern.   She was agreeable to going to [facility]. 3. EPISODES: "How many times has there been blood in the urine?" or "How many times today?"     Having blood in urine for a month At this 4. PAIN with URINATION: "Is there any pain with passing your urine?" If Yes, ask: "How bad is the pain?"  (Scale 1-10; or mild, moderate, severe)    - MILD: Complains slightly about urination hurting.    - MODERATE: Interferes with normal activities.      - SEVERE: Excruciating, unwilling or unable to urinate because of the pain.       5. FEVER: "Do you have a fever?" If Yes, ask: "What is your temperature, how was it measured, and when did it start?"      6. ASSOCIATED SYMPTOMS: "Are you passing urine more frequently than usual?"      7. OTHER SYMPTOMS: "Do you have any other symptoms?" (e.g., back/flank pain, abdomen pain, vomiting)      8. PREGNANCY: "Is there any chance you are pregnant?" "When was your last menstrual period?"  Protocols used: Urine - Blood  In-A-AH

## 2022-06-16 ENCOUNTER — Telehealth: Payer: Self-pay

## 2022-06-16 DIAGNOSIS — R310 Gross hematuria: Secondary | ICD-10-CM | POA: Diagnosis not present

## 2022-06-16 DIAGNOSIS — J410 Simple chronic bronchitis: Secondary | ICD-10-CM | POA: Diagnosis not present

## 2022-06-16 DIAGNOSIS — F324 Major depressive disorder, single episode, in partial remission: Secondary | ICD-10-CM

## 2022-06-16 DIAGNOSIS — N3289 Other specified disorders of bladder: Secondary | ICD-10-CM | POA: Diagnosis not present

## 2022-06-16 DIAGNOSIS — D62 Acute posthemorrhagic anemia: Secondary | ICD-10-CM | POA: Diagnosis not present

## 2022-06-16 LAB — URINE CULTURE: Culture: NO GROWTH

## 2022-06-16 LAB — TYPE AND SCREEN
ABO/RH(D): O POS
Antibody Screen: NEGATIVE
Unit division: 0

## 2022-06-16 LAB — GLUCOSE, CAPILLARY
Glucose-Capillary: 92 mg/dL (ref 70–99)
Glucose-Capillary: 190 mg/dL — ABNORMAL HIGH (ref 70–99)
Glucose-Capillary: 109 mg/dL — ABNORMAL HIGH (ref 70–99)

## 2022-06-16 LAB — BPAM RBC
Blood Product Expiration Date: 202311032359
ISSUE DATE / TIME: 202309281732
Unit Type and Rh: 5100

## 2022-06-16 LAB — HEMOGLOBIN AND HEMATOCRIT, BLOOD
HCT: 25.9 % — ABNORMAL LOW (ref 36.0–46.0)
HCT: 27.5 % — ABNORMAL LOW (ref 36.0–46.0)
HCT: 27.7 % — ABNORMAL LOW (ref 36.0–46.0)
Hemoglobin: 8.4 g/dL — ABNORMAL LOW (ref 12.0–15.0)
Hemoglobin: 9.1 g/dL — ABNORMAL LOW (ref 12.0–15.0)
Hemoglobin: 9.2 g/dL — ABNORMAL LOW (ref 12.0–15.0)

## 2022-06-16 NOTE — Progress Notes (Signed)
Urology Inpatient Progress Note  Subjective: No acute events overnight.  She is afebrile, VSS. Hemoglobin up today, 9.1.  She is s/p 1 unit PRBCs last night. Foley catheter in place draining red urine on slow drip CBI.  She denies any difficulty voiding. Today she asks if her recent falls have been causing her gross hematuria.  She is interested in using [employer] benefits to arrange transportation for her upcoming TURBT next week.  Anti-infectives: Anti-infectives (From admission, onward)    None       Current Facility-Administered Medications  Medication Dose Route Frequency Provider Last Rate Last Admin   acetaminophen (TYLENOL) tablet 650 mg  650 mg Oral Q8H Agbata, Tochukwu, MD   650 mg at 06/16/22 0351   albuterol (PROVENTIL) (2.5 MG/3ML) 0.083% nebulizer solution 3 mL  3 mL Inhalation Q4H PRN Agbata, Tochukwu, MD       aspirin EC tablet 81 mg  81 mg Oral Daily Agbata, Tochukwu, MD       atorvastatin (LIPITOR) tablet 40 mg  40 mg Oral Daily Agbata, Tochukwu, MD   40 mg at 06/15/22 1715   buPROPion (WELLBUTRIN XL) 24 hr tablet 150 mg  150 mg Oral Daily Agbata, Tochukwu, MD   150 mg at 06/15/22 1715   cyclobenzaprine (FLEXERIL) tablet 5 mg  5 mg Oral TID PRN Agbata, Tochukwu, MD       docusate sodium (COLACE) capsule 100 mg  100 mg Oral BID PRN Agbata, Tochukwu, MD       famotidine (PEPCID) tablet 20 mg  20 mg Oral QHS Agbata, Tochukwu, MD   20 mg at 06/15/22 2010   fluticasone furoate-vilanterol (BREO ELLIPTA) 100-25 MCG/ACT 1 puff  1 puff Inhalation Daily Agbata, Tochukwu, MD       And   umeclidinium bromide (INCRUSE ELLIPTA) 62.5 MCG/ACT 1 puff  1 puff Inhalation Daily Agbata, Tochukwu, MD       influenza vaccine adjuvanted (FLUAD) injection 0.5 mL  0.5 mL Intramuscular Tomorrow-1000 Agbata, Tochukwu, MD       nicotine (NICODERM CQ - dosed in mg/24 hours) patch 21 mg  21 mg Transdermal Daily Agbata, Tochukwu, MD   21 mg at 06/15/22 1715   sodium chloride irrigation 0.9 %  3,000 mL  3,000 mL Irrigation Continuous Naaman Plummer, MD   3,000 mL at 06/15/22 2200   traZODone (DESYREL) tablet 50 mg  50 mg Oral QHS PRN Agbata, Tochukwu, MD   50 mg at 06/15/22 2010   Objective: Vital signs in last 24 hours: Temp:  [97.7 F (36.5 C)-98.5 F (36.9 C)] 98.2 F (36.8 C) (09/29 0737) Pulse Rate:  [72-92] 75 (09/29 0737) Resp:  [16-20] 16 (09/29 0737) BP: (110-127)/(63-87) 121/73 (09/29 0737) SpO2:  [96 %-100 %] 96 % (09/29 0737) Weight:  [50 kg] 50 kg (09/28 1220)  Intake/Output from previous day: 09/28 0701 - 09/29 0700 In: 6536.8 [P.O.:240; Blood:396.8] Out: 3150 [Urine:3150] Intake/Output this shift: Total I/O In: -  Out: 9326 [Urine:2300]  Physical Exam Vitals and nursing note reviewed.  Constitutional:      General: She is not in acute distress.    Appearance: She is cachectic. She is not ill-appearing, toxic-appearing or diaphoretic.  Pulmonary:     Effort: Pulmonary effort is normal. No respiratory distress.  Skin:    General: Skin is warm and dry.  Neurological:     Mental Status: She is alert and oriented to person, place, and time.  Psychiatric:  Mood and Affect: Mood normal.        Behavior: Behavior normal.    Lab Results:  Recent Labs    06/15/22 1223 06/15/22 2336 06/16/22 0757  WBC 4.7  --   --   HGB 7.3* 9.1* 9.2*  HCT 22.9* 27.7* 27.5*  PLT 279  --   --    BMET Recent Labs    06/15/22 1223  NA 138  K 4.0  CL 103  CO2 24  GLUCOSE 119*  BUN 19  CREATININE 0.77  CALCIUM 9.5    Studies/Results: CT Renal Stone Study  Result Date: 06/15/2022 CLINICAL DATA:  Flank pain, hematuria, suspected kidney stone EXAM: CT ABDOMEN AND PELVIS WITHOUT CONTRAST TECHNIQUE: Multidetector CT imaging of the abdomen and pelvis was performed following the standard protocol without IV contrast. RADIATION DOSE REDUCTION: This exam was performed according to the departmental dose-optimization program which includes automated exposure  control, adjustment of the mA and/or kV according to patient size and/or use of iterative reconstruction technique. COMPARISON:  05/23/2022 FINDINGS: Lower chest: Lung bases appear emphysematous but clear Hepatobiliary: Gallbladder and liver normal appearance Pancreas: Normal appearance Spleen: Normal appearance Adrenals/Urinary Tract: Mild thickening of adrenal glands without discrete mass. Nonobstructing BILATERAL renal calculi. Scattered renal vascular calcifications. No renal mass, hydronephrosis or hydroureter. No ureteral calcifications. Soft tissue mass at bladder base 19 x 16 x 14 mm question bladder neoplasm. Additional less well-defined mass at LEFT anterolateral bladder 20 x 14 x 10 mm question additional bladder tumor. These are better defined on the prior contrast enhanced CT exam. Stomach/Bowel: Increased stool in colon. Normal appendix. Stomach and bowel loops otherwise normal appearance. Vascular/Lymphatic: Extensive atherosclerotic calcifications aorta, iliac arteries, femoral arteries, visceral arteries. Infrarenal abdominal aortic aneurysm 4.0 x 3.7 cm image 35 extending approximately 3.8 cm length. No surrounding infiltration or hemorrhage. No adenopathy. Reproductive: Unremarkable uterus and adnexa Other: No free air or free fluid.  No hernia. Musculoskeletal: Osseous demineralization with degenerative changes lumbar spine. IMPRESSION: Nonobstructing BILATERAL renal calculi. Soft tissue masses again identified at bladder base 19 x 16 x 14 mm and at LEFT anterolateral bladder wall 20 x 14 x 10 mm, highly suspicious for bladder neoplasms; recommend correlation with cystoscopy. Infrarenal abdominal aortic aneurysm 4.0 x 3.7 cm extending approximately 3.8 cm length, unchanged; Recommend follow-up every 12 months and vascular consultation. This recommendation follows ACR consensus guidelines: White Paper of the ACR Incidental Findings Committee II on Vascular Findings. J Am Coll Radiol 2013;  10:789-794. Recommend follow-up ultrasound every 6 months and vascular consultation. Emphysema (ICD10-J43.9). Aortic Atherosclerosis (ICD10-I70.0). Aortic aneurysm NOS (ICD10-I71.9). Electronically Signed   By: Lavonia Dana M.D.   On: 06/15/2022 13:07    Assessment & Plan: 76 year old female with 2 known bladder lesions repeatedly lost to follow-up and recurrent gross hematuria with acute blood loss anemia on Plavix admitted with acute blood loss anemia once more.  Her hemoglobin is up today following 1 unit PRBCs.  CBI has been started for unclear reasons.  I am placing orders to discontinue CBI and remove her Foley catheter this morning, as this is likely worsening her gross hematuria due to irritation of friable bladder tumors.  We discussed that she needs to undergo TURBT next Friday, 06/23/2022, to remove her bladder tumors.  We discussed that her bladder tumors and anticoagulation are the source of her gross hematuria and that she is likely falling due to acute blood loss anemia secondary to these.  She will need to stay off Plavix until  about 2 to 3 days after surgery.  Okay to continue aspirin.  She expressed understanding.  I have concerns about transportation issues related to her upcoming surgery.  Will coordinate with hospitalist and social work regarding their recommendations.  Ultimately, may need to consider admitting her overnight for monitoring with discharge 24 hours after surgery if she is unable to arrange transportation.  Debroah Loop, PA-C 06/16/2022

## 2022-06-16 NOTE — Progress Notes (Signed)
[facility] Posting Form   Surgery Date/Time: Date: 06/23/2022  Surgeon: Dr. Nickolas Madrid, MD  Surgery Location: Day Surgery  Inpt ( No  )   Outpt (Yes)   Obs ( No  )   Diagnosis: D49.4 Bladder Tumor  -CPT: 09200, 41593  Surgery: Transurethral Resection of Bladder Tumor with Intravesical instillation of Gemcitabine  Stop Anticoagulations: Yes, patient to hold Plavix, may continue '81mg'$  ASA  Cardiac/Medical/Pulmonary Clearance needed: no  *Orders entered into EPIC  Date: 06/16/22   *Case booked in EPIC  Date: 06/16/22  *Notified pt of Surgery: Date: 06/16/22  PRE-OP UA & CX: yes, sent on 06/15/2022  *Placed into Prior Authorization Work Fabio Bering Date: 06/16/22   Assistant/laser/rep:No

## 2022-06-16 NOTE — Progress Notes (Signed)
I just spoke with the patient's son, [name], via telephone.  There is a DPR in her chart from 2021 that allows me to disclose PHI to him.  He reports that he resides with his mother.  We discussed that we have scheduled her for TURBT next Friday for management of 2 large bladder tumors that are highly concerning for bladder cancer and are the very likely source of her ongoing hematuria and blood loss anemia.  We discussed that delaying treatment increases her risk for progression of disease to muscle invasive bladder cancer and increases the risk for metastasis.  We discussed that she will need a family member or friend to drive her to and from the hospital for surgery and stay with her for 24 hours postoperatively as she recovers from anesthesia.  He assures me that their family will be able to provide transportation.  I advised him that our surgical scheduler, Melissa, would be in touch regarding preop coordination.  He expressed understanding.

## 2022-06-16 NOTE — Hospital Course (Signed)
76 year old female with a known history of CVA with left-sided residual weakness, type 2 diabetes mellitus, hypertension, nicotine dependence, COPD, rheumatoid arthritis admitted for hematuria  9/29: Urology recommends holding Plavix.  No Foley or CBI while here.  TURBT scheduled for next Friday as an outpatient per urology 9/30: Persistent hematuria per patient

## 2022-06-16 NOTE — Progress Notes (Signed)
Foley cath discontinued as MD order , will do voiding trial.

## 2022-06-16 NOTE — Telephone Encounter (Signed)
Kendra Hoff, PA spoke with [name] while inpatient. We have discussed possible surgery dates and Friday October 6th, 2023 was agreed upon by all parties. Patient given information about surgery date, what to expect pre-operatively and post operatively.  We discussed that a Pre-Admission Testing office will be calling to set up the pre-op visit that will take place prior to surgery, and that these appointments are typically done over the phone with a Pre-Admissions RN.  Informed patient that our office will communicate any additional care to be provided after surgery. Patients questions or concerns were discussed during our call. Advised to call our office should there be any additional information, questions or concerns that arise. Patient verbalized understanding.   I have included instructions for surgery, I have mailed a copy to patient's address as well.

## 2022-06-16 NOTE — Progress Notes (Signed)
  Progress Note   Patient: Kendra [name] VZC:[id number] DOB: [birth year] DOA: 06/15/2022     1 DOS: the patient was seen and examined on 06/16/2022   Brief hospital course: 76 year old female with a known history of CVA with left-sided residual weakness, type 2 diabetes mellitus, hypertension, nicotine dependence, COPD, rheumatoid arthritis admitted for hematuria  9/29: Urology recommends holding Plavix.  No Foley or CBI while here.  TURBT scheduled for next Friday as an outpatient per urology   Assessment and Plan: * ABLA (acute blood loss anemia) Secondary to gross hematuria most likely related to bladder mass concerning for malignancy with concomitant dual antiplatelet use. Hold  Plavix, continue aspirin Status post transfusional 1 unit of packed RBC Check serial H&H.  Transfuse if hemoglobin drops less than 7  Hematuria Evaluated by urology.  Foley discontinued and CBI stopped per urology recommendation.  TURBT next Friday for management of 2 large bladder tumors that are highly concerning for bladder cancer and are the very likely source of her ongoing hematuria and blood loss anemia.  Holding Plavix  COPD (chronic obstructive pulmonary disease) (HCC) Stable and not acutely exacerbated Continue as needed bronchodilator therapy as well as inhaled steroids.  Hemiparesis affecting left side as late effect of stroke [facility]) Patient with a history of a prior stroke with left-sided hemiparesis. Hold Plavix due to symptomatic acute blood loss anemia from hematuria and planned procedure as an outpatient.  Continue aspirin -urology is okay with continuing aspirin Continue atorvastatin  Diabetes mellitus type 2, controlled, with complications ([place]) Diet controlled Maintain consistent carbohydrate diet   Nicotine dependence Counseled, continue nicotine transdermal patch 21 mg daily  Gastro-esophageal reflux disease without esophagitis Continue Pepcid  Depression Continue bupropion  and trazodone.        Subjective: No new issues, persistent hematuria  Physical Exam: Vitals:   06/16/22 0002 06/16/22 0355 06/16/22 0737 06/16/22 1642  BP: 117/72 126/75 121/73 117/72  Pulse: 88 89 75 72  Resp: '20 17 16 17  '$ Temp: 98.4 F (36.9 C) 97.9 F (36.6 C) 98.2 F (36.8 C) 98.7 F (37.1 C)  TempSrc:   Oral Oral  SpO2: 99% 100% 96% 97%  Weight:      Height:       76 year old cachectic looking female lying in the bed comfortably in no acute distress Lungs clear to auscultation bilaterally Cardiovascular regular rate and rhythm Abdomen soft, benign Neuro alert and awake GU: Hematuria present in the Foley bag Data Reviewed:  Hemoglobin 8.4  Family Communication: Son updated over phone  Disposition: Status is: Inpatient Remains inpatient appropriate because: Await clearance of hematuria over the weekend   Planned Discharge Destination: Home    DVT prophylaxis-SCDs Time spent: 35 minutes  Author: Max Sane, MD 06/16/2022 7:38 PM  For on call review [email or web address].

## 2022-06-17 DIAGNOSIS — D62 Acute posthemorrhagic anemia: Secondary | ICD-10-CM | POA: Diagnosis not present

## 2022-06-17 DIAGNOSIS — R310 Gross hematuria: Secondary | ICD-10-CM | POA: Diagnosis not present

## 2022-06-17 DIAGNOSIS — F324 Major depressive disorder, single episode, in partial remission: Secondary | ICD-10-CM | POA: Diagnosis not present

## 2022-06-17 DIAGNOSIS — J410 Simple chronic bronchitis: Secondary | ICD-10-CM | POA: Diagnosis not present

## 2022-06-17 LAB — BASIC METABOLIC PANEL
Anion gap: 5 (ref 5–15)
BUN: 17 mg/dL (ref 8–23)
CO2: 26 mmol/L (ref 22–32)
Calcium: 9.3 mg/dL (ref 8.9–10.3)
Chloride: 108 mmol/L (ref 98–111)
Creatinine, Ser: 0.82 mg/dL (ref 0.44–1.00)
GFR, Estimated: >60 mL/min (ref >60)
Glucose, Bld: 108 mg/dL — ABNORMAL HIGH (ref 70–99)
Potassium: 3.9 mmol/L (ref 3.5–5.1)
Sodium: 139 mmol/L (ref 135–145)

## 2022-06-17 LAB — CBC
HCT: 26 % — ABNORMAL LOW (ref 36.0–46.0)
Hemoglobin: 8.5 g/dL — ABNORMAL LOW (ref 12.0–15.0)
MCH: 30 pg (ref 26.0–34.0)
MCHC: 32.7 g/dL (ref 30.0–36.0)
MCV: 91.9 fL (ref 80.0–100.0)
Platelets: 280 10*3/uL (ref 150–400)
RBC: 2.83 MIL/uL — ABNORMAL LOW (ref 3.87–5.11)
RDW: 13.7 % (ref 11.5–15.5)
WBC: 6.8 10*3/uL (ref 4.0–10.5)
nRBC: 0 % (ref 0.0–0.2)

## 2022-06-17 LAB — GLUCOSE, CAPILLARY
Glucose-Capillary: 118 mg/dL — ABNORMAL HIGH (ref 70–99)
Glucose-Capillary: 123 mg/dL — ABNORMAL HIGH (ref 70–99)

## 2022-06-17 NOTE — Assessment & Plan Note (Signed)
Diet controlled Maintain consistent carbohydrate diet

## 2022-06-17 NOTE — Assessment & Plan Note (Signed)
Counseled, continue nicotine transdermal patch 21 mg daily

## 2022-06-17 NOTE — Assessment & Plan Note (Signed)
Continue Pepcid  

## 2022-06-17 NOTE — Assessment & Plan Note (Signed)
Stable and not acutely exacerbated Continue as needed bronchodilator therapy as well as inhaled steroids 

## 2022-06-17 NOTE — Assessment & Plan Note (Signed)
Continue bupropion and trazodone

## 2022-06-17 NOTE — Assessment & Plan Note (Signed)
Secondary to gross hematuria most likely related to bladder mass concerning for malignancy with concomitant dual antiplatelet use. Hold  Plavix, continue aspirin Status post transfusional 1 unit of packed RBC Check serial H&H.  Transfuse if hemoglobin drops less than 7

## 2022-06-17 NOTE — Progress Notes (Signed)
  Progress Note   Patient: Kendra [name] QRF:[id number] DOB: 29-Jan-1946 DOA: 06/15/2022     2 DOS: the patient was seen and examined on 06/17/2022   Brief hospital course: 76 year old female with a known history of CVA with left-sided residual weakness, type 2 diabetes mellitus, hypertension, nicotine dependence, COPD, rheumatoid arthritis admitted for hematuria  9/29: Urology recommends holding Plavix.  No Foley or CBI while here.  TURBT scheduled for next Friday as an outpatient per urology 9/30: Persistent hematuria per patient   Assessment and Plan: * ABLA (acute blood loss anemia) Secondary to gross hematuria most likely related to bladder mass concerning for malignancy with concomitant dual antiplatelet use. Hold  Plavix, continue aspirin Status post transfusional 1 unit of packed RBC Check serial H&H.  Transfuse if hemoglobin drops less than 7  Hematuria Evaluated by urology.  Foley discontinued and CBI stopped per urology recommendation. TURBT next Friday for management of 2 large bladder tumors that are highly concerning for bladder cancer and are the very likely source of her ongoing hematuria and blood loss anemia.  Holding Plavix and aspirin for now  COPD (chronic obstructive pulmonary disease) (HCC) Stable and not acutely exacerbated Continue as needed bronchodilator therapy as well as inhaled steroids.  Hemiparesis affecting left side as late effect of stroke [name]) Patient with a history of a prior stroke with left-sided hemiparesis. Hold Plavix due to symptomatic acute blood loss anemia from hematuria and planned procedure as an outpatient.   -Due to persistent hematuria will hold aspirin for now.  Patient understands the risk of holding antiplatelets Continue atorvastatin  Diabetes mellitus type 2, controlled, with complications ([place]) Diet controlled Maintain consistent carbohydrate diet   Nicotine dependence Counseled, continue nicotine transdermal patch 21 mg  daily  Gastro-esophageal reflux disease without esophagitis Continue Pepcid  Depression Continue bupropion and trazodone        Subjective: Patient ports having persistent hematuria when she urinates  Physical Exam: Vitals:   06/16/22 2115 06/17/22 0036 06/17/22 0544 06/17/22 0818  BP: 111/61 100/62 102/64 96/61  Pulse: 83 80 83 82  Resp: '16 16 16 17  '$ Temp: 97.9 F (36.6 C) (!) 97.2 F (36.2 C) 98.4 F (36.9 C) 97.9 F (36.6 C)  TempSrc: Oral     SpO2: 100% 100% 99% 100%  Weight:      Height:       76 year old cachectic looking female lying in the bed comfortably in no acute distress Lungs clear to auscultation bilaterally Cardiovascular regular rate and rhythm Abdomen soft, benign Neuro alert and awake  Data Reviewed:  Hemoglobin 8.5  Family Communication: None  Disposition: Status is: Inpatient Remains inpatient appropriate because: Persistent hematuria   Planned Discharge Destination: Home    DVT prophylaxis-SCDs Time spent: 35 minutes  Author: Max Sane, MD 06/17/2022 3:58 PM  For on call review [email or web address].

## 2022-06-17 NOTE — Progress Notes (Signed)
Mobility Specialist - Progress Note   06/17/22 0941  Mobility  Activity Ambulated with assistance in hallway;Ambulated with assistance to bathroom;Stood at bedside;Dangled on edge of bed  Level of Assistance Standby assist, set-up cues, supervision of patient - no hands on  Assistive Device None  Distance Ambulated (ft) 240 ft  Activity Response Tolerated well  $Mobility charge 1 Mobility   Pt supine in bed on RA upon arrival. Pt STS and ambulates to restroom and in hallway SBA. Pt has no LOB or complaints during session. Pt returns to bed with needs in reach.   Kendra [name]  Mobility Specialist  06/17/22 9:43 AM

## 2022-06-17 NOTE — Assessment & Plan Note (Signed)
Patient with a history of a prior stroke with left-sided hemiparesis. Hold Plavix due to symptomatic acute blood loss anemia from hematuria and planned procedure as an outpatient.   -Due to persistent hematuria will hold aspirin for now.  Patient understands the risk of holding antiplatelets Continue atorvastatin

## 2022-06-17 NOTE — Assessment & Plan Note (Signed)
Evaluated by urology.  Foley discontinued and CBI stopped per urology recommendation. TURBT next Friday for management of 2 large bladder tumors that are highly concerning for bladder cancer and are the very likely source of her ongoing hematuria and blood loss anemia.  Holding Plavix and aspirin for now

## 2022-06-18 DIAGNOSIS — D649 Anemia, unspecified: Secondary | ICD-10-CM | POA: Diagnosis not present

## 2022-06-18 DIAGNOSIS — R310 Gross hematuria: Secondary | ICD-10-CM | POA: Diagnosis not present

## 2022-06-18 DIAGNOSIS — I69354 Hemiplegia and hemiparesis following cerebral infarction affecting left non-dominant side: Secondary | ICD-10-CM

## 2022-06-18 DIAGNOSIS — D62 Acute posthemorrhagic anemia: Secondary | ICD-10-CM | POA: Diagnosis not present

## 2022-06-18 DIAGNOSIS — R319 Hematuria, unspecified: Secondary | ICD-10-CM | POA: Diagnosis not present

## 2022-06-18 LAB — CBC
HCT: 24 % — ABNORMAL LOW (ref 36.0–46.0)
Hemoglobin: 7.8 g/dL — ABNORMAL LOW (ref 12.0–15.0)
MCH: 30 pg (ref 26.0–34.0)
MCHC: 32.5 g/dL (ref 30.0–36.0)
MCV: 92.3 fL (ref 80.0–100.0)
Platelets: 239 10*3/uL (ref 150–400)
RBC: 2.6 MIL/uL — ABNORMAL LOW (ref 3.87–5.11)
RDW: 13.7 % (ref 11.5–15.5)
WBC: 6.4 10*3/uL (ref 4.0–10.5)
nRBC: 0 % (ref 0.0–0.2)

## 2022-06-18 LAB — GLUCOSE, CAPILLARY: Glucose-Capillary: 98 mg/dL (ref 70–99)

## 2022-06-18 NOTE — TOC CM/SW Note (Signed)
Cab voucher provided. RN to call when ready.  Oleh Genin, [place]

## 2022-06-18 NOTE — Progress Notes (Signed)
Called to patient's room.  Patient states that she just killed a bedbug on her sheets.  There was a small spot of blood on her sheet at the site she reports killing the bug.  Patient is upset about this and requests being moved into another room.  Upon inspection, a small bug was found on her bed and put in tape and taken to CN.  Patient moved into another room, all clothing bagged and patient given new gown and paper scrubs.  MD, [facility], EVS and staff notified.

## 2022-06-18 NOTE — Progress Notes (Signed)
Mobility Specialist - Progress Note   06/18/22 0954  Mobility  Activity Ambulated with assistance in hallway  Level of Assistance Standby assist, set-up cues, supervision of patient - no hands on  Assistive Device None  Distance Ambulated (ft) 240 ft  Activity Response Tolerated well  $Mobility charge 1 Mobility   Pt EOB on RA upon arrival. Pt STS and ambulates in hallway supervision. Pt returns to bed with needs in reach.   Gretchen Short  Mobility Specialist  06/18/22 9:55 AM

## 2022-06-19 ENCOUNTER — Telehealth: Payer: Self-pay | Admitting: *Deleted

## 2022-06-19 DIAGNOSIS — D494 Neoplasm of unspecified behavior of bladder: Secondary | ICD-10-CM

## 2022-06-19 NOTE — Telephone Encounter (Signed)
   Telephone encounter was:  Unsuccessful.  06/19/2022 Name: GINELLE BAYS MRN: [id number] DOB: [birth year]  Unsuccessful outbound call made today to assist with:  Transportation Needs  and Food Insecurity  Outreach Attempt:  1st Attempt  A HIPAA compliant voice message was left requesting a return call.  Instructed patient to call back at [phone].  [place] [phone] [address] , [place] [address] Email : [email or web address]

## 2022-06-19 NOTE — Patient Outreach (Signed)
  Care Coordination TOC Note Transition Care Management Follow-up Telephone Call Date of discharge and from where: [name] [phone] How have you been since you were released from the hospital? Feel better no more blood when I go to the bathroom Any questions or concerns? Yes No dependable transportation to take her for her surgery Items Reviewed: Did the pt receive and understand the discharge instructions provided? Yes  Medications obtained and verified? Yes  Other? No  Any new allergies since your discharge? No  Dietary orders reviewed? No Do you have support at home? Yes   Home Care and Equipment/Supplies: Were home health services ordered? not applicable If so, what is the name of the agency? N/a  Has the agency set up a time to come to the patient's home? not applicable Were any new equipment or medical supplies ordered?  No What is the name of the medical supply agency? N/a Were you able to get the supplies/equipment? not applicable Do you have any questions related to the use of the equipment or supplies? No  Functional Questionnaire: (I = Independent and D = Dependent) ADLs: I  Bathing/Dressing- I  Meal Prep- I  Eating- I  Maintaining continence- I  Transferring/Ambulation- I  Managing Meds- I  Follow up appointments reviewed:  PCP Hospital f/u appt confirmed? No  S. Specialist Hospital f/u appt confirmed? y Scheduled to see Surgery [phone] Are transportation arrangements needed? No  If their condition worsens, is the pt aware to call PCP or go to the Emergency Dept.? Yes Was the patient provided with contact information for the PCP's office or ED? Y  Was to pt encouraged to call back with questions or concerns? Yes  SDOH assessments and interventions completed:   y  Care Coordination Interventions Activated:  Yes   Care Coordination Interventions: Referred for [facility] meal Plan activated for Dual SNP/ Referred for PCP appointment to be scheduled/ RN gave patient  number to order incontinent supplies.Transportation arranged    Encounter Outcome:  Pt. Visit Completed    [facility] [phone]

## 2022-06-20 ENCOUNTER — Telehealth: Payer: Self-pay

## 2022-06-20 ENCOUNTER — Telehealth: Payer: Self-pay | Admitting: *Deleted

## 2022-06-20 NOTE — Telephone Encounter (Signed)
Spoke with patient and scheduled a telephonic Palliative Consult for 06/29/22 @ 2 PM.  Consent obtained; updated Netsmart, Team List and Epic.

## 2022-06-20 NOTE — Telephone Encounter (Signed)
   Telephone encounter was:  Successful.  06/20/2022 Name: Kendra [name] MRN: [id number] DOB: 1946/09/03  Kendra [name] is a 76 y.o. year old female who is a primary care patient of [name], NP . The community resource team was consulted for assistance with Transportation Needs   Care guide performed the following interventions: Patient provided with information about care guide support team and interviewed to confirm resource needs.  Follow Up Planpatient has medicaid transportation through united [employer] care dual plan proivived information about transportation patient to call on their own/ activated [id number] ands also united health care    [facility] [address] , [place] [address] Email : [email or web address]

## 2022-06-20 NOTE — Discharge Summary (Signed)
Physician Discharge Summary   Patient: Kendra [name] MRN: [id number] DOB: [birth year]  Admit date:     06/15/2022  Discharge date: 06/18/2022  Discharge Physician: Max Sane   PCP: [name], NP   Recommendations at discharge:   Follow-up with outpatient providers as requested  Discharge Diagnoses: Principal Problem:   ABLA (acute blood loss anemia) Active Problems:   Hematuria   Depression   Gastro-esophageal reflux disease without esophagitis   Nicotine dependence   Diabetes mellitus type 2, controlled, with complications (HCC)   Hemiparesis affecting left side as late effect of stroke (HCC)   COPD (chronic obstructive pulmonary disease) (HCC)   Symptomatic anemia  Hospital Course: 76 year old female with a known history of CVA with left-sided residual weakness, type 2 diabetes mellitus, hypertension, nicotine dependence, COPD, rheumatoid arthritis admitted for hematuria  9/29: Urology recommends holding Plavix.  No Foley or CBI while here.  TURBT scheduled for next Friday as an outpatient per urology 9/30: Persistent hematuria per patient  Assessment and Plan: * ABLA (acute blood loss anemia) Secondary to gross hematuria most likely related to bladder mass concerning for malignancy with concomitant dual antiplatelet use. Hold  Plavix, continue aspirin per urology Status post transfusional 1 unit of packed RBC  Hematuria Evaluated by urology.  Foley discontinued and CBI stopped per urology recommendation. TURBT next Friday on 10/6 for management of 2 large bladder tumors that are highly concerning for bladder cancer and are the very likely source of her ongoing hematuria and blood loss anemia.  Holding Plavix for now per urology  COPD (chronic obstructive pulmonary disease) ([place]) Stable and not acutely exacerbated Continue as needed bronchodilator therapy as well as inhaled steroids.  Hemiparesis affecting left side as late effect of stroke [facility]) Patient with a  history of a prior stroke with left-sided hemiparesis. Hold Plavix due to symptomatic acute blood loss anemia from hematuria and planned procedure as an outpatient.   -Patient understands the risk of holding antiplatelets Continue atorvastatin  Diabetes mellitus type 2, controlled, with complications ([place]) Diet controlled Maintain consistent carbohydrate diet  Nicotine dependence Counseled  Gastro-esophageal reflux disease without esophagitis Continue Pepcid  Depression Continue bupropion and trazodone         Consultants: Urology Disposition: Home Diet recommendation:  Discharge Diet Orders (From admission, onward)     Start     Ordered   06/18/22 0000  Diet - low sodium heart healthy        06/18/22 1234           Carb modified diet DISCHARGE MEDICATION: Allergies as of 06/18/2022       Reactions   Citalopram Nausea And Vomiting        Medication List     STOP taking these medications    cephALEXin 500 MG capsule Commonly known as: KEFLEX   clopidogrel 75 MG tablet Commonly known as: PLAVIX   docusate sodium 100 MG capsule Commonly known as: Colace   metFORMIN 500 MG tablet Commonly known as: GLUCOPHAGE       TAKE these medications    Accu-Chek Guide test strip Generic drug: glucose blood Use as directed up to 4 times daily. DX: E11.9   Accu-Chek Softclix Lancets lancets USE AS DIRECTED. UP TO 4 TIMES A DAY DX: E11.9   albuterol 108 (90 Base) MCG/ACT inhaler Commonly known as: VENTOLIN HFA INHALE 1-2 PUFFS BY MOUTH EVERY 6 HOURS AS NEEDED WHEEZING/ SHORTNESS OF BREATH   aspirin EC 81 MG tablet Take 1  tablet (81 mg total) by mouth daily. Swallow whole.   atorvastatin 40 MG tablet Commonly known as: LIPITOR TAKE 1 TABLET BY MOUTH BEDTIME   blood glucose meter kit and supplies Kit Dispense based on patient and insurance preference. Use up to four times daily as directed.   buPROPion 150 MG 24 hr tablet Commonly known as:  WELLBUTRIN XL TAKE 1 TABLET BY MOUTH ONCE DAILY   cyclobenzaprine 5 MG tablet Commonly known as: FLEXERIL TAKE 1 TABLET BY MOUTH 3 TIMES DAILY AS NEEDED FOR MUSCLE SPASMS   famotidine 20 MG tablet Commonly known as: Pepcid Take 1 tablet (20 mg total) by mouth 2 (two) times daily.   ONE TOUCH DELICA LANCING DEV Misc 1 Device by Does not apply route 2 (two) times daily.   OneTouch Verio w/Device Kit USE AS DIRECTED   traZODone 50 MG tablet Commonly known as: DESYREL TAKE 1/2 TO 1 TABLET BY MOUTH AT [employer] NEEDED SLEEP   Trelegy Ellipta 100-62.5-25 MCG/ACT Aepb Generic drug: Fluticasone-Umeclidin-Vilant USE ONE INHALATION INTO THE LUNGS ONCE A DAY RINSE MOUTH AFTER EACH USE   TYLENOL 8 HOUR ARTHRITIS PAIN PO Take 2 capsules by mouth in the morning, at noon, in the evening, and at bedtime.        Follow-up Information     [name], NP. Schedule an appointment as soon as possible for a visit in 1 week(s).   Specialties: Internal Medicine, Emergency Medicine Why: OFFICE CLOSED AT THIS TIME PATIENT TO MAKE OWN FOLLOW UP [facility] Discharge F/UP Contact information: [place] [address] [phone]         Billey Co, MD. Schedule an appointment as soon as possible for a visit on 06/23/2022.   Specialty: Urology Why: OFFICE CLOSED AT THIS TIME PATIENT TO MAKE OWN FOLLOW UP APPT    as scheduled for TURBT Contact information: [place] [address] [phone]                Discharge Exam: Danley Danker Weights   06/15/22 1220  Weight: 36 kg   76 year old cachectic looking female lying in the bed comfortably in no acute distress Lungs clear to auscultation bilaterally Cardiovascular regular rate and rhythm Abdomen soft, benign Neuro alert and awake  Condition at discharge: fair  The results of significant diagnostics from this hospitalization (including imaging, microbiology, ancillary and  laboratory) are listed below for reference.   Imaging Studies: CT Renal Stone Study  Result Date: 06/15/2022 CLINICAL DATA:  Flank pain, hematuria, suspected kidney stone EXAM: CT ABDOMEN AND PELVIS WITHOUT CONTRAST TECHNIQUE: Multidetector CT imaging of the abdomen and pelvis was performed following the standard protocol without IV contrast. RADIATION DOSE REDUCTION: This exam was performed according to the departmental dose-optimization program which includes automated exposure control, adjustment of the mA and/or kV according to patient size and/or use of iterative reconstruction technique. COMPARISON:  05/23/2022 FINDINGS: Lower chest: Lung bases appear emphysematous but clear Hepatobiliary: Gallbladder and liver normal appearance Pancreas: Normal appearance Spleen: Normal appearance Adrenals/Urinary Tract: Mild thickening of adrenal glands without discrete mass. Nonobstructing BILATERAL renal calculi. Scattered renal vascular calcifications. No renal mass, hydronephrosis or hydroureter. No ureteral calcifications. Soft tissue mass at bladder base 19 x 16 x 14 mm question bladder neoplasm. Additional less well-defined mass at LEFT anterolateral bladder 20 x 14 x 10 mm question additional bladder tumor. These are better defined on the prior contrast enhanced CT exam. Stomach/Bowel: Increased stool in  colon. Normal appendix. Stomach and bowel loops otherwise normal appearance. Vascular/Lymphatic: Extensive atherosclerotic calcifications aorta, iliac arteries, femoral arteries, visceral arteries. Infrarenal abdominal aortic aneurysm 4.0 x 3.7 cm image 35 extending approximately 3.8 cm length. No surrounding infiltration or hemorrhage. No adenopathy. Reproductive: Unremarkable uterus and adnexa Other: No free air or free fluid.  No hernia. Musculoskeletal: Osseous demineralization with degenerative changes lumbar spine. IMPRESSION: Nonobstructing BILATERAL renal calculi. Soft tissue masses again identified at  bladder base 19 x 16 x 14 mm and at LEFT anterolateral bladder wall 20 x 14 x 10 mm, highly suspicious for bladder neoplasms; recommend correlation with cystoscopy. Infrarenal abdominal aortic aneurysm 4.0 x 3.7 cm extending approximately 3.8 cm length, unchanged; Recommend follow-up every 12 months and vascular consultation. This recommendation follows ACR consensus guidelines: White Paper of the ACR Incidental Findings Committee II on Vascular Findings. J Am Coll Radiol 2013; 10:789-794. Recommend follow-up ultrasound every 6 months and vascular consultation. Emphysema (ICD10-J43.9). Aortic Atherosclerosis (ICD10-I70.0). Aortic aneurysm NOS (ICD10-I71.9). Electronically Signed   By: Lavonia Dana M.D.   On: 06/15/2022 13:07   CT HEMATURIA WORKUP  Result Date: 05/23/2022 CLINICAL DATA:  Gross hematuria. EXAM: CT ABDOMEN AND PELVIS WITHOUT AND WITH CONTRAST TECHNIQUE: Multidetector CT imaging of the abdomen and pelvis was performed following the standard protocol before and following the bolus administration of intravenous contrast. RADIATION DOSE REDUCTION: This exam was performed according to the departmental dose-optimization program which includes automated exposure control, adjustment of the mA and/or kV according to patient size and/or use of iterative reconstruction technique. CONTRAST:  159m OMNIPAQUE IOHEXOL 300 MG/ML  SOLN COMPARISON:  CT 02/08/2022 FINDINGS: Lower chest: The lung bases are clear. Hepatobiliary: No focal liver abnormality is seen. No gallstones, gallbladder wall thickening, or biliary dilatation. Pancreas: No ductal dilatation or inflammation. Spleen: Normal in size without focal abnormality. Adrenals/Urinary Tract: 11 mm left adrenal nodule is unchanged from prior exam. No follow-up imaging is recommended. Normal right adrenal gland. There are multiple bilateral nonobstructing intrarenal calculi. No hydronephrosis. Stable small cyst in the upper left kidney, no further follow-up is  recommended. No solid or enhancing renal lesion. No perinephric edema. There are no filling defects within the ureters on delayed phase imaging. Again seen 2 enhancing lobulated polypoid lesions in the urinary bladder. Lesion arising in the left anterior dome measures 2 cm, unchanged. Right inferior lesion measures 18 mm, previously 7 mm. No definite additional new lesion. There is mild enhancement along the left lateral urinary bladder. Stomach/Bowel: Unremarkable appearance of the stomach. There is no bowel obstruction or inflammation. Normal appendix. Moderate volume of colonic stool. Vascular/Lymphatic: 4 cm infrarenal aortic aneurysm, unchanged in size on my retrospective measurement. Dense aortic atherosclerosis. No aneurysm rupture or acute aortic findings. Patent portal vein. No suspicious abdominopelvic lymph nodes. Reproductive: Uterus and bilateral adnexa are unremarkable. Other: No free air or ascites. Musculoskeletal: Degenerative change in the spine and right hip. There are no acute or suspicious osseous abnormalities. IMPRESSION: 1. Two enhancing lobulated polypoid lesions in the urinary bladder highly suspicious for neoplasm, largest measuring 2 cm. The more inferior lesion has enlarged from prior exam. Cystoscopy again suggested. 2. Bilateral nonobstructing intrarenal calculi. 3. Unchanged 4 cm infrarenal aortic aneurysm. Recommend follow-up every 12 months and vascular consultation. This recommendation follows ACR consensus guidelines: White Paper of the ACR Incidental Findings Committee II on Vascular Findings. J Am Coll Radiol 2013; 10:789-794. Aortic Atherosclerosis (ICD10-I70.0). Electronically Signed   By: MKeith RakeM.D.   On: 05/23/2022 23:08  Microbiology: Results for orders placed or performed during the hospital encounter of 06/15/22  Urine Culture     Status: None   Collection Time: 06/15/22  6:55 PM   Specimen: Urine, Clean Catch  Result Value Ref Range Status   Specimen  Description   Final    URINE, CLEAN CATCH Performed at [facility], [address], [place], [place] [address]    Special Requests   Final    NONE Performed at [facility], [address], [place], [place] [address]    Culture   Final    NO GROWTH Performed at [facility], [place] [address] [place], [place] [address]    Report Status 06/16/2022 FINAL  Final   *Note: Due to a large number of results and/or encounters for the requested time period, some results have not been displayed. A complete set of results can be found in Results Review.    Labs: CBC: Recent Labs  Lab 06/15/22 1223 06/15/22 2336 06/16/22 0757 06/16/22 1844 06/17/22 0606 06/18/22 0654  WBC 4.7  --   --   --  6.8 6.4  NEUTROABS 2.3  --   --   --   --   --   HGB 7.3* 9.1* 9.2* 8.4* 8.5* 7.8*  HCT 22.9* 27.7* 27.5* 25.9* 26.0* 24.0*  MCV 92.7  --   --   --  91.9 92.3  PLT 279  --   --   --  280 440   Basic Metabolic Panel: Recent Labs  Lab 06/15/22 1223 06/17/22 0606  NA 138 139  K 4.0 3.9  CL 103 108  CO2 24 26  GLUCOSE 119* 108*  BUN 19 17  CREATININE 0.77 0.82  CALCIUM 9.5 9.3   Liver Function Tests: No results for input(s): "AST", "ALT", "ALKPHOS", "BILITOT", "PROT", "ALBUMIN" in the last 168 hours. CBG: Recent Labs  Lab 06/16/22 1125 06/16/22 1638 06/17/22 0525 06/17/22 1720 06/18/22 0845  GLUCAP 190* 109* 118* 123* 98    Discharge time spent: greater than 30 minutes.  Signed: Max Sane, MD [facility] 06/20/2022

## 2022-06-21 ENCOUNTER — Other Ambulatory Visit: Payer: Self-pay

## 2022-06-21 ENCOUNTER — Encounter
Admission: RE | Admit: 2022-06-21 | Discharge: 2022-06-21 | Disposition: A | Discharge status: Home / Self Care | Payer: Medicare Other | Source: Ambulatory Visit | Attending: Urology | Admitting: Urology

## 2022-06-21 ENCOUNTER — Encounter
Admit: 2022-06-21 | Discharge: 2022-06-21 | Disposition: A | Discharge status: Home / Self Care | Payer: Medicare Other | Attending: Urology | Admitting: Urology

## 2022-06-21 DIAGNOSIS — Z01812 Encounter for preprocedural laboratory examination: Secondary | ICD-10-CM | POA: Diagnosis not present

## 2022-06-21 DIAGNOSIS — Z01818 Encounter for other preprocedural examination: Secondary | ICD-10-CM

## 2022-06-21 DIAGNOSIS — D649 Anemia, unspecified: Secondary | ICD-10-CM | POA: Diagnosis not present

## 2022-06-21 HISTORY — DX: Neoplasm of unspecified behavior of bladder: D49.4

## 2022-06-21 HISTORY — DX: Occlusion and stenosis of unspecified carotid artery: I65.29

## 2022-06-21 HISTORY — DX: Anemia, unspecified: D64.9

## 2022-06-21 HISTORY — DX: Atherosclerosis of aorta: I70.0

## 2022-06-21 HISTORY — DX: Hematuria, unspecified: R31.9

## 2022-06-21 LAB — CBC
HCT: 26.7 % — ABNORMAL LOW (ref 36.0–46.0)
Hemoglobin: 8.5 g/dL — ABNORMAL LOW (ref 12.0–15.0)
MCH: 29.6 pg (ref 26.0–34.0)
MCHC: 31.8 g/dL (ref 30.0–36.0)
MCV: 93 fL (ref 80.0–100.0)
Platelets: 339 10*3/uL (ref 150–400)
RBC: 2.87 MIL/uL — ABNORMAL LOW (ref 3.87–5.11)
RDW: 13.7 % (ref 11.5–15.5)
WBC: 5.6 10*3/uL (ref 4.0–10.5)
nRBC: 0 % (ref 0.0–0.2)

## 2022-06-21 LAB — TYPE AND SCREEN
ABO/RH(D): O POS
Antibody Screen: NEGATIVE
Extend sample reason: TRANSFUSED

## 2022-06-21 NOTE — Patient Instructions (Signed)
Your procedure is scheduled on:06-23-22 Friday Report to the Registration Desk on the 1st floor of the [facility].Then proceed to the 2nd floor Surgery Desk To find out your arrival time, please call [phone] between 1PM - 3PM on:06-22-22 Thursday If your arrival time is 6:00 am, do not arrive prior to that time as the [facility] entrance doors do not open until 6:00 am.  REMEMBER: Instructions that are not followed completely may result in serious medical risk, up to and including death; or upon the discretion of your surgeon and anesthesiologist your surgery may need to be rescheduled.  Do not eat food OR drink any liquids after midnight the night before surgery.  No gum chewing, lozengers or hard candies.  Do NOT take any medication the morning of surgery  Use your Albuterol Inhaler the day of surgery and bring your Albuterol Inhaler to the hospital  Last dose of clopidogrel (PLAVIX) was on 06-14-22 as instructed by Dr Diamantina Providence  Continue your 81 mg Aspirin up until the day prior to surgery-Do NOT take morning of surgery  One week prior to surgery: Stop Anti-inflammatories (NSAIDS) such as Advil, Aleve, Ibuprofen, Motrin, Naproxen, Naprosyn and Aspirin based products such as Excedrin, Goodys Powder, BC Powder.You may however, continue to take Tylenol if needed for pain up until the day of surgery.  Stop ANY OVER THE COUNTER supplements/vitamins NOW 06-21-22) until after surgery.   No Alcohol for 24 hours before or after surgery.  No Smoking including e-cigarettes for 24 hours prior to surgery.  No chewable tobacco products for at least 6 hours prior to surgery.  No nicotine patches on the day of surgery.  Do not use any "recreational" drugs for at least a week prior to your surgery.  Please be advised that the combination of cocaine and anesthesia may have negative outcomes, up to and including death. If you test positive for cocaine, your surgery will be cancelled.  On the  morning of surgery brush your teeth with toothpaste and water, you may rinse your mouth with mouthwash if you wish. Do not swallow any toothpaste or mouthwash.  Do not wear jewelry, make-up, hairpins, clips or nail polish.  Do not wear lotions, powders, or perfumes.   Do not shave body from the neck down 48 hours prior to surgery just in case you cut yourself which could leave a site for infection.  Also, freshly shaved skin may become irritated if using the CHG soap.  Contact lenses, hearing aids and dentures may not be worn into surgery.  Do not bring valuables to the hospital. [facility] is not responsible for any missing/lost belongings or valuables.   Notify your doctor if there is any change in your medical condition (cold, fever, infection).  Wear comfortable clothing (specific to your surgery type) to the hospital.  After surgery, you can help prevent lung complications by doing breathing exercises.  Take deep breaths and cough every 1-2 hours. Your doctor may order a device called an Incentive Spirometer to help you take deep breaths. When coughing or sneezing, hold a pillow firmly against your incision with both hands. This is called "splinting." Doing this helps protect your incision. It also decreases belly discomfort.  If you are being admitted to the hospital overnight, leave your suitcase in the car. After surgery it may be brought to your room.  If you are being discharged the day of surgery, you will not be allowed to drive home. You will need a responsible adult (  18 years or older) to drive you home and stay with you that night.   If you are taking public transportation, you will need to have a responsible adult (18 years or older) with you. Please confirm with your physician that it is acceptable to use public transportation.   Please call the [facility] at [phone] if you have any questions about these instructions.  Surgery Visitation  Policy:  Patients undergoing a surgery or procedure may have two family members or support persons with them as long as the person is not COVID-19 positive or experiencing its symptoms.

## 2022-06-22 ENCOUNTER — Encounter: Payer: Self-pay | Admitting: Oncology

## 2022-06-22 ENCOUNTER — Telehealth: Payer: Self-pay | Admitting: *Deleted

## 2022-06-22 NOTE — Telephone Encounter (Signed)
   Telephone encounter was:  Successful.  06/22/2022 Name: Kendra [name] MRN: [id number] DOB: 1945-09-23  Kendra [name] is a 76 y.o. year old female who is a primary care patient of [name], NP . The community resource team was consulted for assistance with [place] guide performed the following interventions: Patient provided with information about care guide support team and interviewed to confirm resource needs Follow up call placed to the patient to discuss status of referral. I received an e mail from CM at [facility] that said  I attempted to reach this Member but had to leave a VM. Thank you Farber RN CCM  Patient informed of number to reach out to case management     Follow Up Plan:  No further follow up planned at this time. The patient has been provided with needed resources.  [facility] [phone] [address] , [place] [address] Email : [email or web address]

## 2022-06-23 ENCOUNTER — Ambulatory Visit: Payer: Medicare Other | Admitting: Urgent Care

## 2022-06-23 ENCOUNTER — Other Ambulatory Visit: Payer: Self-pay

## 2022-06-23 ENCOUNTER — Encounter
Admission: RE | Disposition: A | Discharge status: Home / Self Care | Payer: Self-pay | Source: Ambulatory Visit | Attending: Urology

## 2022-06-23 ENCOUNTER — Ambulatory Visit
Admission: RE | Admit: 2022-06-23 | Discharge: 2022-06-23 | Disposition: A | Discharge status: Home / Self Care | Payer: Medicare Other | Source: Ambulatory Visit | Attending: Urology | Admitting: Urology

## 2022-06-23 ENCOUNTER — Encounter: Payer: Self-pay | Admitting: Urology

## 2022-06-23 DIAGNOSIS — I10 Essential (primary) hypertension: Secondary | ICD-10-CM | POA: Insufficient documentation

## 2022-06-23 DIAGNOSIS — D494 Neoplasm of unspecified behavior of bladder: Secondary | ICD-10-CM

## 2022-06-23 DIAGNOSIS — E1151 Type 2 diabetes mellitus with diabetic peripheral angiopathy without gangrene: Secondary | ICD-10-CM | POA: Insufficient documentation

## 2022-06-23 DIAGNOSIS — C678 Malignant neoplasm of overlapping sites of bladder: Secondary | ICD-10-CM | POA: Diagnosis not present

## 2022-06-23 DIAGNOSIS — C672 Malignant neoplasm of lateral wall of bladder: Secondary | ICD-10-CM | POA: Diagnosis not present

## 2022-06-23 DIAGNOSIS — M069 Rheumatoid arthritis, unspecified: Secondary | ICD-10-CM | POA: Insufficient documentation

## 2022-06-23 DIAGNOSIS — E1140 Type 2 diabetes mellitus with diabetic neuropathy, unspecified: Secondary | ICD-10-CM | POA: Insufficient documentation

## 2022-06-23 DIAGNOSIS — F17210 Nicotine dependence, cigarettes, uncomplicated: Secondary | ICD-10-CM | POA: Diagnosis not present

## 2022-06-23 DIAGNOSIS — D649 Anemia, unspecified: Secondary | ICD-10-CM | POA: Insufficient documentation

## 2022-06-23 DIAGNOSIS — G8194 Hemiplegia, unspecified affecting left nondominant side: Secondary | ICD-10-CM | POA: Diagnosis not present

## 2022-06-23 DIAGNOSIS — Z01818 Encounter for other preprocedural examination: Secondary | ICD-10-CM

## 2022-06-23 DIAGNOSIS — J4489 Other specified chronic obstructive pulmonary disease: Secondary | ICD-10-CM | POA: Diagnosis not present

## 2022-06-23 DIAGNOSIS — C671 Malignant neoplasm of dome of bladder: Secondary | ICD-10-CM | POA: Diagnosis not present

## 2022-06-23 DIAGNOSIS — Z87442 Personal history of urinary calculi: Secondary | ICD-10-CM | POA: Insufficient documentation

## 2022-06-23 DIAGNOSIS — J449 Chronic obstructive pulmonary disease, unspecified: Secondary | ICD-10-CM | POA: Diagnosis not present

## 2022-06-23 DIAGNOSIS — M1990 Unspecified osteoarthritis, unspecified site: Secondary | ICD-10-CM | POA: Diagnosis not present

## 2022-06-23 HISTORY — PX: TRANSURETHRAL RESECTION OF BLADDER TUMOR: SHX2575

## 2022-06-23 HISTORY — PX: BLADDER INSTILLATION: SHX6893

## 2022-06-23 LAB — GLUCOSE, CAPILLARY: Glucose-Capillary: 177 mg/dL — ABNORMAL HIGH (ref 70–99)

## 2022-06-23 SURGERY — TURBT (TRANSURETHRAL RESECTION OF BLADDER TUMOR)
Anesthesia: General

## 2022-06-23 MED ORDER — FENTANYL CITRATE (PF) 100 MCG/2ML IJ SOLN
INTRAMUSCULAR | Status: DC | PRN
  Administered 2022-06-23: 25 ug via INTRAVENOUS
  Administered 2022-06-23: 50 ug via INTRAVENOUS
  Administered 2022-06-23: 25 ug via INTRAVENOUS

## 2022-06-23 MED ORDER — LIDOCAINE HCL (CARDIAC) PF 100 MG/5ML IV SOSY
PREFILLED_SYRINGE | INTRAVENOUS | Status: DC | PRN
  Administered 2022-06-23: 50 mg via INTRAVENOUS

## 2022-06-23 MED ORDER — MIDAZOLAM HCL 2 MG/2ML IJ SOLN
INTRAMUSCULAR | Status: DC | PRN
  Administered 2022-06-23: 1 mg via INTRAVENOUS

## 2022-06-23 MED ORDER — SUGAMMADEX SODIUM 200 MG/2ML IV SOLN
INTRAVENOUS | Status: DC | PRN
  Administered 2022-06-23: 200 mg via INTRAVENOUS

## 2022-06-23 MED ORDER — LACTATED RINGERS IV SOLN: INTRAVENOUS | Status: DC

## 2022-06-23 MED ORDER — ROCURONIUM BROMIDE 100 MG/10ML IV SOLN
INTRAVENOUS | Status: DC | PRN
  Administered 2022-06-23: 10 mg via INTRAVENOUS
  Administered 2022-06-23: 30 mg via INTRAVENOUS

## 2022-06-23 MED ORDER — ONDANSETRON HCL 4 MG/2ML IJ SOLN
INTRAMUSCULAR | Status: DC | PRN
  Administered 2022-06-23: 4 mg via INTRAVENOUS

## 2022-06-23 MED ORDER — FENTANYL CITRATE (PF) 100 MCG/2ML IJ SOLN: 25.0000 ug | INTRAMUSCULAR | Status: DC | PRN

## 2022-06-23 MED ORDER — PROPOFOL 10 MG/ML IV BOLUS
INTRAVENOUS | Status: DC | PRN
  Administered 2022-06-23: 80 mg via INTRAVENOUS

## 2022-06-23 MED ORDER — CHLORHEXIDINE GLUCONATE 0.12 % MT SOLN
OROMUCOSAL | Status: AC
  Administered 2022-06-23: 15 mL via OROMUCOSAL
  Filled 2022-06-23: qty 15

## 2022-06-23 MED ORDER — FENTANYL CITRATE (PF) 100 MCG/2ML IJ SOLN
INTRAMUSCULAR | Status: AC
  Filled 2022-06-23: qty 2

## 2022-06-23 MED ORDER — OXYCODONE HCL 5 MG/5ML PO SOLN: 5.0000 mg | Freq: Once | ORAL | Status: DC | PRN

## 2022-06-23 MED ORDER — GEMCITABINE CHEMO FOR BLADDER INSTILLATION 2000 MG
INTRAVENOUS | Status: DC | PRN
  Administered 2022-06-23: 2000 mg via INTRAVESICAL

## 2022-06-23 MED ORDER — OXYCODONE HCL 5 MG PO TABS: 5.0000 mg | ORAL_TABLET | Freq: Once | ORAL | Status: DC | PRN

## 2022-06-23 MED ORDER — ONDANSETRON HCL 4 MG/2ML IJ SOLN: 4.0000 mg | Freq: Once | INTRAMUSCULAR | Status: DC | PRN

## 2022-06-23 MED ORDER — FAMOTIDINE 20 MG PO TABS
20.0000 mg | ORAL_TABLET | Freq: Once | ORAL | Status: AC
  Administered 2022-06-23: 20 mg via ORAL

## 2022-06-23 MED ORDER — CEFAZOLIN SODIUM-DEXTROSE 2-4 GM/100ML-% IV SOLN
INTRAVENOUS | Status: AC
  Filled 2022-06-23: qty 100

## 2022-06-23 MED ORDER — CEFAZOLIN SODIUM-DEXTROSE 2-4 GM/100ML-% IV SOLN
2.0000 g | INTRAVENOUS | Status: AC
  Administered 2022-06-23: 2 g via INTRAVENOUS

## 2022-06-23 MED ORDER — ACETAMINOPHEN 10 MG/ML IV SOLN: 1000.0000 mg | Freq: Once | INTRAVENOUS | Status: DC | PRN

## 2022-06-23 MED ORDER — ESMOLOL HCL 100 MG/10ML IV SOLN
INTRAVENOUS | Status: DC | PRN
  Administered 2022-06-23: 10 mg via INTRAVENOUS

## 2022-06-23 MED ORDER — GEMCITABINE CHEMO FOR BLADDER INSTILLATION 2000 MG
2000.0000 mg | Freq: Once | INTRAVENOUS | Status: DC
  Filled 2022-06-23: qty 52.6

## 2022-06-23 MED ORDER — MIDAZOLAM HCL 2 MG/2ML IJ SOLN
INTRAMUSCULAR | Status: AC
  Filled 2022-06-23: qty 2

## 2022-06-23 MED ORDER — SODIUM CHLORIDE 0.9 % IR SOLN
Status: DC | PRN
  Administered 2022-06-23 (×2): 3000 mL via INTRAVESICAL

## 2022-06-23 MED ORDER — HYDROCODONE-ACETAMINOPHEN 5-325 MG PO TABS: 1.0000 | ORAL_TABLET | Freq: Four times a day (QID) | ORAL | 0 refills | Status: AC | PRN

## 2022-06-23 MED ORDER — CHLORHEXIDINE GLUCONATE 0.12 % MT SOLN: 15.0000 mL | Freq: Once | OROMUCOSAL | Status: AC

## 2022-06-23 MED ORDER — FAMOTIDINE 20 MG PO TABS
ORAL_TABLET | ORAL | Status: AC
  Filled 2022-06-23: qty 1

## 2022-06-23 MED ORDER — SODIUM CHLORIDE 0.9 % IV SOLN: INTRAVENOUS | Status: DC

## 2022-06-23 MED ORDER — ORAL CARE MOUTH RINSE: 15.0000 mL | Freq: Once | OROMUCOSAL | Status: AC

## 2022-06-23 SURGICAL SUPPLY — 28 items
BAG DRAIN SIEMENS DORNER NS (MISCELLANEOUS) ×1 IMPLANT
BAG DRN NS LF (MISCELLANEOUS) ×1
BAG DRN RND TRDRP ANRFLXCHMBR (UROLOGICAL SUPPLIES) ×1
BAG URINE DRAIN 2000ML AR STRL (UROLOGICAL SUPPLIES) ×1 IMPLANT
BRUSH SCRUB EZ  4% CHG (MISCELLANEOUS) ×1
BRUSH SCRUB EZ 4% CHG (MISCELLANEOUS) ×1 IMPLANT
CATH FOL 2WAY LX 20X30 (CATHETERS) IMPLANT
DRAPE UTILITY 15X26 TOWEL STRL (DRAPES) ×1 IMPLANT
DRSG TELFA 3X4 N-ADH STERILE (GAUZE/BANDAGES/DRESSINGS) ×1 IMPLANT
ELECT REM PT RETURN 9FT ADLT (ELECTROSURGICAL) ×1
ELECTRODE REM PT RTRN 9FT ADLT (ELECTROSURGICAL) IMPLANT
GLOVE SURG UNDER POLY LF SZ7.5 (GLOVE) ×1 IMPLANT
GOWN STRL REUS W/ TWL LRG LVL3 (GOWN DISPOSABLE) ×1 IMPLANT
GOWN STRL REUS W/ TWL XL LVL3 (GOWN DISPOSABLE) ×1 IMPLANT
GOWN STRL REUS W/TWL LRG LVL3 (GOWN DISPOSABLE) ×1
GOWN STRL REUS W/TWL XL LVL3 (GOWN DISPOSABLE) ×1
IV NS IRRIG 3000ML ARTHROMATIC (IV SOLUTION) ×2 IMPLANT
KIT TURNOVER CYSTO (KITS) ×1 IMPLANT
LOOP CUT BIPOLAR 24F LRG (ELECTROSURGICAL) IMPLANT
NDL SAFETY ECLIP 18X1.5 (MISCELLANEOUS) ×1 IMPLANT
PACK CYSTO AR (MISCELLANEOUS) ×1 IMPLANT
PAD ARMBOARD 7.5X6 YLW CONV (MISCELLANEOUS) ×1 IMPLANT
SET IRRIG Y TYPE TUR BLADDER L (SET/KITS/TRAYS/PACK) ×1 IMPLANT
SURGILUBE 2OZ TUBE FLIPTOP (MISCELLANEOUS) ×1 IMPLANT
SYR TOOMEY IRRIG 70ML (MISCELLANEOUS) ×1
SYRINGE TOOMEY IRRIG 70ML (MISCELLANEOUS) ×1 IMPLANT
WATER STERILE IRR 1000ML POUR (IV SOLUTION) ×1 IMPLANT
WATER STERILE IRR 500ML POUR (IV SOLUTION) ×1 IMPLANT

## 2022-06-23 NOTE — Anesthesia Procedure Notes (Signed)
Procedure Name: Intubation Date/Time: 06/23/2022 11:45 AM  Performed by: Lorie Apley, CRNAPre-anesthesia Checklist: Patient identified, Patient being monitored, Timeout performed, Emergency Drugs available and Suction available Patient Re-evaluated:Patient Re-evaluated prior to induction Oxygen Delivery Method: Circle system utilized Preoxygenation: Pre-oxygenation with 100% oxygen Induction Type: IV induction Ventilation: Mask ventilation without difficulty Laryngoscope Size: Mac and 3 Grade View: Grade I Tube type: Oral Tube size: 7.0 mm Number of attempts: 1 Airway Equipment and Method: Stylet Placement Confirmation: ETT inserted through vocal cords under direct vision, positive ETCO2 and breath sounds checked- equal and bilateral Secured at: 21 cm Tube secured with: Tape Dental Injury: Teeth and Oropharynx as per pre-operative assessment

## 2022-06-23 NOTE — Anesthesia Postprocedure Evaluation (Signed)
Anesthesia Post Note  Patient: Kendra [name]  Procedure(s) Performed: TRANSURETHRAL RESECTION OF BLADDER TUMOR (TURBT) BLADDER INSTILLATION OF GEMCITABINE  Patient location during evaluation: PACU Anesthesia Type: General Level of consciousness: awake and alert, oriented and patient cooperative Pain management: pain level controlled Vital Signs Assessment: post-procedure vital signs reviewed and stable Respiratory status: spontaneous breathing, nonlabored ventilation and respiratory function stable Cardiovascular status: blood pressure returned to baseline and stable Postop Assessment: adequate PO intake Anesthetic complications: no   No notable events documented.   Last Vitals:  Vitals:   06/23/22 1245 06/23/22 1300  BP: 127/81 132/71  Pulse: 90 90  Resp: 15 14  Temp:  (!) 36.4 C  SpO2: 100% 100%    Last Pain:  Vitals:   06/23/22 1300  TempSrc:   PainSc: 0-No pain                 Darrin Nipper

## 2022-06-23 NOTE — Discharge Instructions (Signed)

## 2022-06-23 NOTE — Op Note (Signed)
Date of procedure: 06/23/22  Preoperative diagnosis:  Bladder tumor(>2.5cm)  Postoperative diagnosis:  Same  Procedure: TURBT Intravesical instillation of gemcitabine  Surgeon: Nickolas Madrid, MD  Anesthesia: General  Complications: None  Intraoperative findings:  Two papillary bladder tumors, one at the right bladder neck just inside the bladder(2cm), second at the left lateral wall/dome(2.5cm).  Ureteral orifices not involved bilaterally All tumor resected, excellent hemostasis  EBL: Minimal  Specimens: Bladder tumor  Drains: 63 French two-way Foley  Indication: Kendra [name] is a 76 y.o. patient with gross hematuria and CT showing two filling defects consistent with bladder tumors.  After reviewing the management options for treatment, they elected to proceed with the above surgical procedure(s). We have discussed the potential benefits and risks of the procedure, side effects of the proposed treatment, the likelihood of the patient achieving the goals of the procedure, and any potential problems that might occur during the procedure or recuperation. Informed consent has been obtained.  Description of procedure:  The patient was taken to the operating room and general anesthesia was induced. SCDs were placed for DVT prophylaxis. The patient was placed in the dorsal lithotomy position, prepped and draped in the usual sterile fashion, and preoperative antibiotics were administered. A preoperative time-out was performed.   A 23 French rigid cystoscope was used to intubate the urethra and thorough cystoscopy was performed.  There was a 2 cm papillary tumor at the right bladder neck just inside the urethra, as well as a larger 2.5 cm papillary tumor at the left lateral wall/dome.  Subtle papillary change at 1:00 at the bladder neck, and right lateral wall.  A 26 French resectoscope was then inserted into the urethra.  A large resecting loop was used to methodically remove all  abnormal tissue, starting with the tumor at the bladder neck, then moving to the left-sided lateral wall/dome tumor.  This was resected down to the muscle layer of the bladder until all abnormal tissue was removed.  At no point fat was seen.  Meticulous hemostasis was achieved.  There was also a small area of papillary change at 1 o clock at the bladder neck, and at the right lateral wall that was <1cm.  Both areas were carefully fulgurated.  The ureteral orifices were well away from any tumor.  All bladder tumor chips were irrigated free from the bladder and sent for pathology.  With the bladder decompressed there was excellent hemostasis.  A 20 French Foley passed into the bladder and 10 mL were placed in the balloon.  The catheter irrigated easily and remained clear.  This was irrigated with 300 mL of sterile water, then drained.  2 g/50 mL gemcitabine were then instilled into the catheter, and the catheter clamped.  Disposition: Stable to PACU  Plan: Unclamp Foley at 1:20 PM and allow gemcitabine to drain Follow-up in clinic on Thursday 10/12 for Foley removal and discuss pathology  Nickolas Madrid, MD

## 2022-06-23 NOTE — H&P (Signed)
06/23/22 11:16 AM   Kendra [name] [birth year] [id number]  CC: Bladder tumor, gross hematuria  HPI: 76 year old female with gross hematuria for 5 to 6 months who was originally seen in May 2023 with CT showing 2 distinct bladder tumors measuring approximately 2 cm each.  She has not followed up and missed numerous clinic visits.  She was admitted last week for gross hematuria and and anemia requiring transfusion.  Plavix was held.  She presents today for TURBT.   PMH: Past Medical History:  Diagnosis Date   Anemia    Aortic atherosclerosis (HCC)    Asthma    Back pain    Bladder tumor    Carotid artery stenosis    Cataract    Cervical disc disorder    COPD (chronic obstructive pulmonary disease) (HCC)    Depression    Diabetes (HCC)    type II   Diabetic neuropathy (HCC)    Dyspnea    with exertion   GERD (gastroesophageal reflux disease)    Hematuria    History of kidney stones    per patient "can't remember the year"   Hyperlipidemia    Hypertension    per patient "take meds for it"   Insomnia    Neuropathy    Osteoarthritis    Osteopenia    Pneumonia 2018   per patient   Reflux    Rheumatoid arthritis ([place])    Stroke ([place]) 2015   and again 2017  - Weakness in left leg, staggers w/ walking, vision    Tenosynovitis     Surgical History: Past Surgical History:  Procedure Laterality Date   BREAST BIOPSY Right    Fatty Tumor   ENDARTERECTOMY Right 10/21/2019   Procedure: ENDARTERECTOMY CAROTID RIGHT;  Surgeon: [name], MD;  Location: [facility];  Service: Vascular;  Laterality: Right;   ENDARTERECTOMY Left 12/30/2019   Procedure: LEFT CAROTID ENDARTERECTOMY;  Surgeon: [name], MD;  Location: [facility];  Service: Vascular;  Laterality: Left;   NECK SURGERY     OVARIAN CYST REMOVAL     PATCH ANGIOPLASTY Right 10/21/2019   Procedure: Patch Angioplasty Using XenoSure Biologic Patch Right Carotid;  Surgeon: [name],  MD;  Location: [facility];  Service: Vascular;  Laterality: Right;   PATCH ANGIOPLASTY Left 12/30/2019   Procedure: Patch Angioplasty Left Carotid;  Surgeon: [name], MD;  Location: [facility] OR;  Service: Vascular;  Laterality: Left;    Family History: Family History  Problem Relation Age of Onset   Stroke Father    Depression Father    Diabetes Mother    Hyperlipidemia Mother    Breast cancer Mother    Heart murmur Son    Heart murmur Son    Heart murmur Son     Social History:  reports that she has been smoking cigarettes. She has a 15.25 pack-year smoking history. She has quit using smokeless tobacco.  Her smokeless tobacco use included snuff. She reports that she does not drink alcohol and does not use drugs.  Physical Exam: BP 126/80 (BP Location: Right Arm)   Pulse 97   Temp (!) 97 F (36.1 C) (Oral)   Resp 18   SpO2 98%    Constitutional:  Alert and oriented, No acute distress. Cardiovascular: Regular rate and rhythm Respiratory: Clear to auscultation bilaterally GI: Abdomen is soft, nontender, nondistended, no abdominal masses   Laboratory Data: Urine culture 9/28 no growth  Assessment & Plan:   76 year old  female with at least 6 months of gross hematuria, CT showing two bladder tumors measuring ~2cm each as likely etiology of gross hematuria.  No upper tract lesions or lymphadenopathy.  We discussed transurethral resection of bladder tumor (TURBT) and risks and benefits at length. This is typically a 1 to 2-hour procedure done under general anesthesia in the operating room.  A scope is inserted through the urethra and used to resect abnormal tissue within the bladder, which is then sent to the pathologist to determine grade and stage of the tumor.  Risks include bleeding, infection, need for temporary Foley placement, and bladder perforation.  Treatment strategies are based on the type of tumor and depth of invasion.  We briefly reviewed the different treatment  pathways for non-muscle invasive and muscle invasive bladder cancer.  TURBT with possible gemcitabine  Nickolas Madrid, MD 06/23/2022  [facility] [address] [place], [place] [address] [phone]

## 2022-06-23 NOTE — Transfer of Care (Signed)
Immediate Anesthesia Transfer of Care Note  Patient: Kendra [name]  Procedure(s) Performed: TRANSURETHRAL RESECTION OF BLADDER TUMOR (TURBT) BLADDER INSTILLATION OF GEMCITABINE  Patient Location: PACU  Anesthesia Type:General  Level of Consciousness: drowsy  Airway & Oxygen Therapy: Patient Spontanous Breathing and Patient connected to face mask oxygen  Post-op Assessment: Report given to RN, Post -op Vital signs reviewed and stable and Patient moving all extremities  Post vital signs: Reviewed and stable  Last Vitals:  Vitals Value Taken Time  BP 113/70 06/23/22 1233  Temp    Pulse 81 06/23/22 1236  Resp 18 06/23/22 1236  SpO2 100 % 06/23/22 1236  Vitals shown include unvalidated device data.  Last Pain:  Vitals:   06/23/22 1019  TempSrc: Oral  PainSc: 0-No pain         Complications: No notable events documented.

## 2022-06-23 NOTE — Anesthesia Preprocedure Evaluation (Addendum)
Anesthesia Evaluation  Patient identified by MRN, date of birth, ID band Patient awake    Reviewed: Allergy & Precautions, NPO status , Patient's Chart, lab work & pertinent test results  History of Anesthesia Complications Negative for: history of anesthetic complications  Airway Mallampati: I   Neck ROM: Full    Dental  (+) Edentulous Upper, Edentulous Lower   Pulmonary COPD, Current Smoker (1/2 ppd) and Patient abstained from smoking.,    Pulmonary exam normal breath sounds clear to auscultation       Cardiovascular hypertension, + Peripheral Vascular Disease (carotid stenosis s/p b/l CEA)  Normal cardiovascular exam Rhythm:Regular Rate:Normal  ECG 06/21/22:  Normal sinus rhythm with sinus arrhythmia Moderate voltage criteria for LVH, may be normal variant ( R in aVL , Cornell product ) Septal infarct (cited on or before 20-Apr-2021)  Myocardial perfusion 06/21/21:  .  Normal pharmacologic myocardial perfusion stress test without significant ischemia or scar. .  Left ventricular function is normal (EF > 65%). .  Coronary artery calcification and aortic atherosclerosis are noted on the attenuation correction CT. [name]  The study is low risk.  Echo 12/09/18:   Normal left ventricular systolic function, ejection fraction 55 to 28%   Diastolic dysfunction - grade I (normal filling pressures)   Normal right ventricular systolic function   Neuro/Psych PSYCHIATRIC DISORDERS Depression  Neuromuscular disease (diabetic neuropathy) CVA (2015, 2017; residual left-sided weakness)    GI/Hepatic GERD  ,  Endo/Other  diabetes, Type 2  Renal/GU Renal disease (nephrolithiasis)     Musculoskeletal  (+) Arthritis , Osteoarthritis and Rheumatoid disorders,    Abdominal   Peds  Hematology negative hematology ROS (+)   Anesthesia Other Findings Cardiology note 06/10/21:  1. Coronary artery calcification noted chest CT, risk factors  smoking, diabetes, hyperlipidemia.  Last echocardiogram 09/2019 EF normal 55 to 60%.  Obtain Lexiscan Myoview to evaluate any significant ischemia.  Reduce aspirin to 81 mg daily, continue Plavix 75 mg daily.  LDL at goal.  Continue Lipitor. 2. Dyspnea on exertion, this could be an anginal equivalent or secondary to COPD.  Lexiscan Myoview as above.  Last echo with preserved EF. 3. Hyperlipidemia, LDL at goal, continue Lipitor 40 mg daily. 4. Current smoker, cessation advised.  Follow-up after Myoview.  Recommend follow-up with PCP regarding arthritis management.  Referral to rheum may be considered.  Reproductive/Obstetrics                            Anesthesia Physical Anesthesia Plan  ASA: 3  Anesthesia Plan: General   Post-op Pain Management:    Induction: Intravenous  PONV Risk Score and Plan: 2 and Ondansetron, Dexamethasone and Treatment may vary due to age or medical condition  Airway Management Planned: Oral ETT  Additional Equipment:   Intra-op Plan:   Post-operative Plan: Extubation in OR  Informed Consent: I have reviewed the patients History and Physical, chart, labs and discussed the procedure including the risks, benefits and alternatives for the proposed anesthesia with the patient or authorized representative who has indicated his/her understanding and acceptance.     Dental advisory given  Plan Discussed with: CRNA  Anesthesia Plan Comments: (Patient consented for risks of anesthesia including but not limited to:  - adverse reactions to medications - damage to eyes, teeth, lips or other oral mucosa - nerve damage due to positioning  - sore throat or hoarseness - damage to heart, brain, nerves, lungs, other parts of body  or loss of life  Informed patient about role of CRNA in peri- and intra-operative care.  Patient voiced understanding.)        Anesthesia Quick Evaluation

## 2022-06-26 ENCOUNTER — Telehealth: Payer: Self-pay

## 2022-06-26 ENCOUNTER — Inpatient Hospital Stay: Payer: Medicare Other | Admitting: Internal Medicine

## 2022-06-26 ENCOUNTER — Telehealth: Payer: Self-pay | Admitting: Urology

## 2022-06-26 LAB — SURGICAL PATHOLOGY

## 2022-06-26 NOTE — Telephone Encounter (Signed)
Dr. Quay Burow with [facility] called the office today with a pathology call report: invasive high grade urothelial carcinoma.  Please be advised.

## 2022-06-26 NOTE — Telephone Encounter (Signed)
Called and spoke w/ pt receive a message from the nurse access line, pt is have leaking around at her cath and itching , asked if she able come in at 130 pm today to see Sam . Pt is going to look for ride here , pt will call us back. Pt said that she uses transpiration service will 3 day notice. For her appt.

## 2022-06-27 ENCOUNTER — Ambulatory Visit: Payer: Medicare Other | Admitting: Physician Assistant

## 2022-06-29 ENCOUNTER — Ambulatory Visit: Payer: Medicare Other | Admitting: Urology

## 2022-06-29 ENCOUNTER — Ambulatory Visit: Payer: Self-pay

## 2022-06-29 ENCOUNTER — Telehealth: Payer: Medicare Other | Admitting: Primary Care

## 2022-06-29 ENCOUNTER — Ambulatory Visit (INDEPENDENT_AMBULATORY_CARE_PROVIDER_SITE_OTHER): Payer: Medicare Other | Admitting: Urology

## 2022-06-29 DIAGNOSIS — C678 Malignant neoplasm of overlapping sites of bladder: Secondary | ICD-10-CM | POA: Diagnosis not present

## 2022-06-29 NOTE — Patient Outreach (Signed)
  Care Coordination   Follow Up Visit Note   06/29/2022 Name: Kendra [name] MRN: [id number] DOB: 12-Aug-1946  Kendra [name] is a 76 y.o. year old female who sees [name], Coralie Keens, NP for primary care. I spoke with  [name] by phone today.  What matters to the patients health and wellness today?  Finding out what the treatment for her bladder cancer will be. Has an appointment today with the surgeon.    Goals Addressed             This Visit's Progress    RNCM: "I am still bleeding"       Care Coordination Interventions: Evaluation of current treatment plan related to uterine bleeding and patient's adherence to plan as established by provider. The patient had surgery last week for her bladder cancer. She is going to the provider this afternoon for follow up. The patient states that she will know more after her appointment. Advised patient to make sure to keep her appointment for today. She states that she has to call the office for a new appointment with the pcp Provided education to patient [id number] up needed with pcp and to follow up with any additional treatments needed for effective management of her bladder cancer  Reviewed medications with patient and discussed compliance. The patient states compliance with medications Reviewed scheduled/upcoming provider appointments including 06-29-2022 with the surgeon who preformed  her baldder cancer surgery Discussed plans with patient for ongoing care management follow up and provided patient with direct contact information for care management team Advised patient to discuss changes in urinary health and other questions and concerns  with provider Screening for signs and symptoms of depression related to chronic disease state  Assessed social determinant of health barriers Collaboration with the pcp and pharm D concerning the need for strips for her glucose meter and also recommendations because she has lost her pen that sticks  her fingers for finger sticks. Is asking for something that wont hurt her fingers as bad.           SDOH assessments and interventions completed:  Yes  SDOH Interventions Today    Flowsheet Row Most Recent Value  SDOH Interventions   Utilities Interventions Intervention Not Indicated        Care Coordination Interventions Activated:  Yes  Care Coordination Interventions:  Yes, provided   Follow up plan: No further intervention required.   Encounter Outcome:  Pt. Visit Completed   [name] RN, MSN, [place]  Mobile: [phone]

## 2022-06-29 NOTE — Patient Instructions (Signed)
Visit Information  Thank you for taking time to visit with me today. Please don't hesitate to contact me if I can be of assistance to you.   Following are the goals we discussed today:   Goals Addressed             This Visit's Progress    RNCM: "I am still bleeding"       Care Coordination Interventions: Evaluation of current treatment plan related to uterine bleeding and patient's adherence to plan as established by provider. The patient had surgery last week for her bladder cancer. She is going to the provider this afternoon for follow up. The patient states that she will know more after her appointment. Advised patient to make sure to keep her appointment for today. She states that she has to call the office for a new appointment with the pcp Provided education to patient [id number] up needed with pcp and to follow up with any additional treatments needed for effective management of her bladder cancer  Reviewed medications with patient and discussed compliance. The patient states compliance with medications Reviewed scheduled/upcoming provider appointments including 06-29-2022 with the surgeon who preformed  her baldder cancer surgery Discussed plans with patient for ongoing care management follow up and provided patient with direct contact information for care management team Advised patient to discuss changes in urinary health and other questions and concerns  with provider Screening for signs and symptoms of depression related to chronic disease state  Assessed social determinant of health barriers Collaboration with the pcp and pharm D concerning the need for strips for her glucose meter and also recommendations because she has lost her pen that sticks her fingers for finger sticks. Is asking for something that wont hurt her fingers as bad.             Please call the care guide team at [phone] if you need to schedule an appointment.   If you are experiencing a Mental  Health or [place] or need someone to talk to, please call the Suicide and Crisis Lifeline: 988 call the [place] National Suicide Prevention Lifeline: 936-839-2581 or TTY: 630-245-4799 TTY (772)391-2853) to talk to a trained counselor call 1-800-273-TALK (toll free, 24 hour hotline)  The patient verbalized understanding of instructions, educational materials, and care plan provided today and DECLINED offer to receive copy of patient instructions, educational materials, and care plan.   Follow up with provider re: the patient is seeing oncologist currently [name] RN, MSN, [place]  Mobile: [phone]

## 2022-06-29 NOTE — Progress Notes (Signed)
   06/29/2022 3:56 PM   Kendra [name] 1946/01/05 [id number]  Reason for visit: Discuss TURBT pathology  HPI: Frail and comorbid 76 year old female who underwent TURBT with gemcitabine on 06/23/2022 for two ~2.5cm papillary bladder lesions.  All abnormal tissue was resected, and pathology showed HG urothelial cell carcinoma with invasion into the lamina propria, muscle was present and not involved with tumor (HG T1).  She had gross hematuria for at least 6 months, no showed multiple urology appointments prior to finally being admitted for gross hematuria with anemia requiring blood transfusion, and TURBT was set up at that point after holding Plavix.  She has had some spasms from the Foley catheter.  Urine in the Foley is faint pink today.  Keflex was given for prophylaxis and Foley catheter was removed.  We had a long conversation about her new diagnosis of bladder cancer.  We reviewed the AUA guidelines that recommend considering a second look TURBT in patients with T1 disease.  She defers second look TURBT, which I think is reasonable with her complete resection and comorbidities.  We reviewed the efficacy of BCG, and that this helps decrease recurrence and progression in patients with nonmuscle invasive bladder cancer.  She was amenable to pursuing at least induction BCG, and ideally we will continue 2 to 3 years BCG total.  We also reviewed the need for close surveillance cystoscopy every 3 to 4 months for the first 2 years before surveillance can start to be spaced out.  We again reviewed high risk of recurrence with her ongoing smoking, and smoking cessation discussed again at length.  She does not think she can quit smoking.  Induction BCG in 4 weeks, followed by initial surveillance cystoscopy in approximately 3 months Smoking cessation discussed at [place], [place] [address], [place], [place] [address] [phone]

## 2022-07-04 ENCOUNTER — Telehealth: Payer: Self-pay | Admitting: Internal Medicine

## 2022-07-04 NOTE — Telephone Encounter (Signed)
Copied from [place] [id number]. Topic: General - Other >> Jul 04, 2022 10:51 AM Everette C wrote: Donah Driver with [name] would like to speak with a member of clinical staff regarding the patient's prescriptions for   Please contact further when possible to review the medications   docusate sodium (COLACE) capsule 100 mg   [[id number]]  metFORMIN (GLUCOPHAGE-XR) 500 MG 24 hr tablet [[id number]]  DISCONTINUED  clopidogrel (PLAVIX) 75 MG tablet [[id number]]  DISCONTINUED  cephALEXin (KEFLEX) 500 MG capsule [[id number]]  DISCONTINUED

## 2022-07-04 NOTE — Telephone Encounter (Signed)
[name] called and spoke [facility], [employer]. She wanted to verify that the medications listed are discontinued, advised they are no longer on her current medication profile list.   docusate sodium (COLACE) capsule 100 mg   [[id number]]   metFORMIN (GLUCOPHAGE-XR) 500 MG 24 hr tablet [[id number]]  DISCONTINUED   clopidogrel (PLAVIX) 75 MG tablet [[id number]]  DISCONTINUED   cephALEXin (KEFLEX) 500 MG capsule [[id number]]  DISCONTINUED

## 2022-07-11 ENCOUNTER — Telehealth: Payer: Self-pay

## 2022-07-11 NOTE — Telephone Encounter (Signed)
-----   Message from Billey Co, MD sent at 06/29/2022  3:11 PM EDT ----- Regarding: BCG Please schedule induction BCG to start in 3-6 weeks, followed by cystoscopy with me 2 months after completion of BCG, thanks  Nickolas Madrid, MD 06/29/2022

## 2022-07-11 NOTE — Telephone Encounter (Signed)
Attempted to reach pt for scheduling, no answer, no VM set up.

## 2022-07-13 NOTE — Telephone Encounter (Signed)
Attempted to reach pt for scheduling, no answer, no VM set up. Attempt #2.

## 2022-07-18 ENCOUNTER — Other Ambulatory Visit: Payer: Medicare Other | Admitting: Primary Care

## 2022-07-18 DIAGNOSIS — J410 Simple chronic bronchitis: Secondary | ICD-10-CM

## 2022-07-18 DIAGNOSIS — C679 Malignant neoplasm of bladder, unspecified: Secondary | ICD-10-CM

## 2022-07-18 DIAGNOSIS — F17200 Nicotine dependence, unspecified, uncomplicated: Secondary | ICD-10-CM | POA: Diagnosis not present

## 2022-07-18 DIAGNOSIS — Z515 Encounter for palliative care: Secondary | ICD-10-CM | POA: Diagnosis not present

## 2022-07-18 NOTE — Progress Notes (Signed)
[facility] Palliative Care Consult Note Telephone: [phone]  Fax: [phone]   Date of encounter: 07/18/22 1:17 PM PATIENT NAME: Kendra [name] [address] [place] [id number]   [phone] (home)  DOB: [birth year] MRN: [id number] PRIMARY CARE PROVIDER:    [name], NP,  [place] [address] [phone]  REFERRING PROVIDER:   [name], [place],  [place] [address] [phone]  RESPONSIBLE PARTY:    Contact Information     Name Relation Home Work Mobile   [place] Son [phone]  [phone]   [name], Double   [place]. Son   [phone]        I met face to face with patient and family in  home. Palliative Care was asked to follow this patient by consultation request of  [name], NP to address advance care planning and complex medical decision making. This is the initial visit.                                     ASSESSMENT AND PLAN / RECOMMENDATIONS:   Advance Care Planning/Goals of Care: Goals include to maximize quality of life and symptom management. Patient/health care surrogate gave his/her permission to discuss.Our advance care planning conversation included a discussion about:    The value and importance of advance care planning  Experiences with loved ones who have been seriously ill or have died  Exploration of personal, cultural or spiritual beliefs that might influence medical decisions  Exploration of goals of care in the event of a sudden injury or illness  Identification  of a healthcare agent - son helps  Review and creation of an  advance directive document . Decision not to resuscitate due to poor prognosis. CODE STATUS: DNR  I completed a MOST form today. The patient and family outlined their wishes for the following treatment decisions:  Cardiopulmonary Resuscitation: Do Not Attempt Resuscitation (DNR/No CPR)   Medical Interventions: Limited Additional Interventions: Use medical treatment, IV fluids and cardiac monitoring as indicated, DO NOT USE intubation or mechanical ventilation. May consider use of less invasive airway support such as BiPAP or CPAP. Also provide comfort measures. Transfer to the hospital if indicated. Avoid intensive care.   Antibiotics: Antibiotics if indicated  IV Fluids: No IV fluids (provide other measures to ensure comfort)  Feeding Tube: No feeding tube    I spent 20 minutes providing this consultation. More than 50% of the time in this consultation was spent in counseling and care coordination. -----------------------------------------------------------------------------------------------------------------------------------------------------------------------------------------------------  Symptom Management/Plan:  Home health indicated for rehab, safety, fall prevention. Has weakness, bladder cancer. H/o CVA, L sided weakness.  Homebound.  PT Eval and Treat OT Eval and Treat SN Medication management, symptom management.   Patient has recent dx of bladder cancer. She found out about it by hematuria and anemia.   Nutrition:  Needs supplemental nutrition, recommend 20-30 g protein. Now weighs 109 lbs, was  117 lbs a year  and a half ago and not taking insulin now. Not on anything for glucose management. Endorses she was 170 lbs last year, does not appear to be a reliable historian.  Glucose management: no recent A 1 C, has been taken off of insulin and no po.   Constipation: endorses, recommend miralax or senna. Also could use senna, Rx 1-2 daily.  Sleep: Has trazodone for hs.  Mobility: Ill appearing, has had 1 fall. Had a quad cane. Has DME that is borrowed. Has not recently had PT or OT services.  Needs f/u for safety, HEP, DME.   Follow up Palliative Care Visit: Palliative care will continue to follow for complex medical decision making, advance care planning,  and clarification of goals. Return 4 weeks or prn.  This visit was coded based on medical decision making (MDM).  PPS: 40%  HOSPICE ELIGIBILITY/DIAGNOSIS: TBD  Chief Complaint: debility, bladder cancer  HISTORY OF PRESENT ILLNESS:  Kendra [name] is a 76 y.o. year old female  with bladder  cancer,  anemia, wt loss, debility, DM.   History obtained from review of EMR, discussion with primary team, and interview with family, facility staff/caregiver and/or Kendra [name].  I reviewed available labs, medications, imaging, studies and related documents from the EMR.  Records reviewed and summarized above.   ROS   General: NAD EYES: denies vision changes ENMT: denies dysphagia Cardiovascular: denies chest pain, denies DOE Pulmonary: denies cough, denies increased SOB Abdomen: endorses fair  appetite, endorses dysgeusia, Endorses  constipation, endorses continence of bowel GU: denies dysuria, endorses continence of urine, denies hematuria MSK:  denies increased weakness,  endorses several  falls  Skin: denies rashes or wounds Neurological: denies pain, denies insomnia Psych: Endorses positive mood Heme/lymph/immuno: denies bruises, abnormal bleeding  Physical Exam: Current and past weights:109 lbs reported Constitutional: NAD General: frail appearing, thin/ EYES: anicteric sclera, lids intact, no discharge  ENMT: intact hearing, oral mucous membranes moist, tongue ashen CV:no LE edema Pulmonary:no increased work of breathing, no cough, room air Abdomen: intake 60%,soft and non tender, no ascites GU: deferred MSK: + sarcopenia, moves all extremities, ambulatory Skin: warm and dry, no rashes or wounds on visible skin Neuro:  + generalized weakness,  no cognitive impairment Psych: non-anxious affect, A and O x 3 Hem/lymph/immuno: no widespread bruising CURRENT PROBLEM LIST:  Patient Active Problem List   Diagnosis Date Noted   ABLA (acute blood loss anemia) 06/15/2022    Hematuria 06/15/2022   Aortic atherosclerosis ([place]) 05/31/2021   COPD (chronic obstructive pulmonary disease) ([place]) 05/31/2021   Symptomatic anemia 05/31/2021   Hemiparesis affecting left side as late effect of stroke ([place]) 02/02/2021   Recurrent major depressive disorder, in partial remission ([place]) 02/02/2021   Carotid artery stenosis 12/30/2019   Hypertension associated with diabetes ([place]) 12/19/2018   Diabetes mellitus type 2, controlled, with complications ([place]) 94/85/4627   Insomnia 03/30/2015   Depression 03/30/2015   Hyperlipidemia associated with type 2 diabetes mellitus ([place]) 03/30/2015   Gastro-esophageal reflux disease without esophagitis 03/30/2015   Osteoarthritis 03/30/2015   Osteopenia 03/30/2015   B12 deficiency 03/30/2015   Nicotine dependence 03/30/2015   PAST MEDICAL HISTORY:  Active Ambulatory Problems    Diagnosis Date Noted   Insomnia 03/30/2015   Depression 03/30/2015   Hyperlipidemia associated with type 2 diabetes mellitus ([place]) 03/30/2015   Gastro-esophageal reflux disease without esophagitis 03/30/2015   Osteoarthritis 03/30/2015   Osteopenia 03/30/2015   B12 deficiency 03/30/2015   Nicotine dependence 03/30/2015   Diabetes mellitus type 2, controlled, with complications ([place]) 03/50/0938   Hypertension associated with diabetes ([place]) 12/19/2018   Carotid artery stenosis 12/30/2019   Hemiparesis affecting left side as late effect of stroke ([place]) 02/02/2021   Recurrent major depressive disorder, in partial remission ([place]) 02/02/2021   Aortic atherosclerosis ([place]) 05/31/2021   COPD (chronic obstructive pulmonary disease) ([place]) 05/31/2021   Symptomatic anemia  05/31/2021   ABLA (acute blood loss anemia) 06/15/2022   Hematuria 06/15/2022   Resolved Ambulatory Problems    Diagnosis Date Noted   Cataract 03/30/2015   Other and unspecified disc disorder of cervical region 03/30/2015   Centrilobular emphysema ([place]) 03/30/2015   Increased thickness of nail  03/30/2015   History of cerebrovascular accident with residual deficit 03/30/2015   Excess weight 03/30/2015   Allergic rhinitis 03/30/2015   Vitamin D deficiency 03/30/2015   Closed fracture of one or more phalanges of foot 08/12/2014   Mycotic toenails 08/12/2014   Diabetic peripheral neuropathy associated with type 2 diabetes mellitus ([place]) 08/12/2014   Weight loss 06/07/2015   Tenosynovitis of wrist 06/07/2015   Protein calorie malnutrition ([place]) 06/07/2015   CMC arthritis, thumb, degenerative 07/16/2015   Early satiety 07/26/2016   Calcification of aorta ([place]) 09/27/2016   Radiculopathy of leg 04/01/2017   Chronic midline low back pain without sciatica 05/07/2018   Weakness of left upper extremity 10/16/2019   Left-sided weakness 10/16/2019   Stenosis of intracranial portions of left internal carotid artery 10/17/2019   Visit for suture removal 01/23/2020   Encounter for screening colonoscopy 01/23/2020   Right hand pain 01/29/2020   Colon cancer screening 05/25/2020   Normocytic anemia 06/15/2020   Vitamin B12 deficiency 08/20/2020   Personal history of tobacco use, presenting hazards to health 08/20/2020   Loss of taste 09/23/2020   Recurrent falls 01/24/2021   Past Medical History:  Diagnosis Date   Anemia    Asthma    Back pain    Bladder tumor    Cervical disc disorder    Diabetes ([place])    Diabetic neuropathy (HCC)    Dyspnea    GERD (gastroesophageal reflux disease)    History of kidney stones    Hyperlipidemia    Hypertension    Neuropathy    Pneumonia 2018   Reflux    Rheumatoid arthritis ([place])    Stroke ([place]) 2015   Tenosynovitis    SOCIAL HX:  Social History   Tobacco Use   Smoking status: Every Day    Packs/day: 0.25    Years: 61.00    Total pack years: 15.25    Types: Cigarettes   Smokeless tobacco: Former    Types: Snuff  Substance Use Topics   Alcohol use: No   FAMILY HX:  Family History  Problem Relation Age of Onset   Stroke  Father    Depression Father    Diabetes Mother    Hyperlipidemia Mother    Breast cancer Mother    Heart murmur Son    Heart murmur Son    Heart murmur Son       ALLERGIES:  Allergies  Allergen Reactions   Citalopram Nausea And Vomiting     PERTINENT MEDICATIONS:  Outpatient Encounter Medications as of 07/18/2022  Medication Sig   Accu-Chek Softclix Lancets lancets USE AS DIRECTED. UP TO 4 TIMES A DAY DX: E11.9   Acetaminophen (TYLENOL 8 HOUR ARTHRITIS PAIN PO) Take 2 capsules by mouth in the morning, at noon, in the evening, and at bedtime.   albuterol (VENTOLIN HFA) 108 (90 Base) MCG/ACT inhaler INHALE 1-2 PUFFS BY MOUTH EVERY 6 HOURS AS NEEDED WHEEZING/ SHORTNESS OF BREATH (Patient taking differently: 1-2 puffs every 6 (six) hours as needed. INHALE 1-2 PUFFS BY MOUTH EVERY 6 HOURS AS NEEDED WHEEZING/ SHORTNESS OF BREATH)   aspirin EC 81 MG tablet Take 1 tablet (81 mg total) by mouth daily. Swallow  whole.   atorvastatin (LIPITOR) 40 MG tablet TAKE 1 TABLET BY MOUTH BEDTIME (Patient taking differently: Take 40 mg by mouth at bedtime.)   blood glucose meter kit and supplies KIT Dispense based on patient and insurance preference. Use up to four times daily as directed.   Blood Glucose Monitoring Suppl (ONETOUCH VERIO) w/Device KIT USE AS DIRECTED   clopidogrel (PLAVIX) 75 MG tablet Take 75 mg by mouth daily.   glucose blood (ACCU-CHEK GUIDE) test strip Use as directed up to 4 times daily. DX: E11.9   Lancet Devices (ONE TOUCH DELICA LANCING DEV) MISC 1 Device by Does not apply route 2 (two) times daily.   traZODone (DESYREL) 50 MG tablet TAKE 1/2 TO 1 TABLET BY MOUTH AT BEDTIMEAS NEEDED SLEEP (Patient taking differently: Take 50 mg by mouth at bedtime.)   TRELEGY ELLIPTA 100-62.5-25 MCG/ACT AEPB USE ONE INHALATION INTO THE LUNGS ONCE A DAY RINSE MOUTH AFTER EACH USE (Patient taking differently: Inhale 1 puff into the lungs at bedtime.)   No facility-administered encounter medications on  file as of 07/18/2022.   Thank you for the opportunity to participate in the care of Ms. Neidhardt.  The palliative care team will continue to follow. Please call our office at [phone] if we can be of additional assistance.   Jason Coop, NP ,   COVID-19 PATIENT SCREENING TOOL Asked and negative response unless otherwise noted:  Have you had symptoms of covid, tested positive or been in contact with someone with symptoms/positive test in the past 5-10 days?

## 2022-07-24 DIAGNOSIS — C679 Malignant neoplasm of bladder, unspecified: Secondary | ICD-10-CM | POA: Diagnosis not present

## 2022-07-24 DIAGNOSIS — F17200 Nicotine dependence, unspecified, uncomplicated: Secondary | ICD-10-CM | POA: Diagnosis not present

## 2022-07-24 DIAGNOSIS — Z8673 Personal history of transient ischemic attack (TIA), and cerebral infarction without residual deficits: Secondary | ICD-10-CM | POA: Diagnosis not present

## 2022-07-24 DIAGNOSIS — J410 Simple chronic bronchitis: Secondary | ICD-10-CM | POA: Diagnosis not present

## 2022-07-26 DIAGNOSIS — Z8673 Personal history of transient ischemic attack (TIA), and cerebral infarction without residual deficits: Secondary | ICD-10-CM | POA: Diagnosis not present

## 2022-07-26 DIAGNOSIS — C679 Malignant neoplasm of bladder, unspecified: Secondary | ICD-10-CM | POA: Diagnosis not present

## 2022-07-26 DIAGNOSIS — F17200 Nicotine dependence, unspecified, uncomplicated: Secondary | ICD-10-CM | POA: Diagnosis not present

## 2022-07-26 DIAGNOSIS — J410 Simple chronic bronchitis: Secondary | ICD-10-CM | POA: Diagnosis not present

## 2022-07-26 NOTE — Telephone Encounter (Signed)
Pt scheduled for 6 induction BCG treatments, pt confirmed.

## 2022-07-27 ENCOUNTER — Other Ambulatory Visit: Payer: Self-pay | Admitting: Internal Medicine

## 2022-07-27 ENCOUNTER — Ambulatory Visit: Payer: Medicare Other | Admitting: Urology

## 2022-07-27 ENCOUNTER — Telehealth: Payer: Self-pay

## 2022-07-27 DIAGNOSIS — J410 Simple chronic bronchitis: Secondary | ICD-10-CM | POA: Diagnosis not present

## 2022-07-27 DIAGNOSIS — F17200 Nicotine dependence, unspecified, uncomplicated: Secondary | ICD-10-CM | POA: Diagnosis not present

## 2022-07-27 DIAGNOSIS — E785 Hyperlipidemia, unspecified: Secondary | ICD-10-CM

## 2022-07-27 DIAGNOSIS — Z8673 Personal history of transient ischemic attack (TIA), and cerebral infarction without residual deficits: Secondary | ICD-10-CM | POA: Diagnosis not present

## 2022-07-27 DIAGNOSIS — C679 Malignant neoplasm of bladder, unspecified: Secondary | ICD-10-CM | POA: Diagnosis not present

## 2022-07-27 NOTE — Telephone Encounter (Signed)
Patient must keep upcoming appointment for further refills. Requested Prescriptions  Pending Prescriptions Disp Refills   buPROPion (WELLBUTRIN XL) 150 MG 24 hr tablet [Pharmacy Med Name: BUPROPION HCL ER (XL) 150 MG TAB] 30 tablet 0    Sig: TAKE 1 TABLET BY MOUTH ONCE DAILY     Psychiatry: Antidepressants - bupropion Failed - 07/27/2022  5:40 PM      Failed - Last BP in normal range    BP Readings from Last 1 Encounters:  06/23/22 (!) 131/90         Failed - Valid encounter within last 6 months    Recent Outpatient Visits           9 months ago Nonspecific chest pain   [facility] [place], Coralie Keens, NP   9 months ago Chest pain, unspecified type   [facility] [place], Coralie Keens, NP   12 months ago Controlled type 2 diabetes mellitus with complication, without long-term current use of insulin ([place])   [facility] [place], Coralie Keens, NP   1 year ago Chronic right shoulder pain   [facility] [place], Coralie Keens, NP   1 year ago Aortic atherosclerosis [facility])   [name], Coralie Keens, NP       Future Appointments             In 4 days [place], Coralie Keens, NP [facility] [place] in normal range and within 360 days    Creat  Date Value Ref Range Status  08/01/2021 0.85 0.60 - 1.00 mg/dL Final   Creatinine, Ser  Date Value Ref Range Status  06/17/2022 0.82 0.44 - 1.00 mg/dL Final   Creatinine, Urine  Date Value Ref Range Status  05/31/2021 89 20 - 275 mg/dL Final         Passed - AST in normal range and within 360 days    AST  Date Value Ref Range Status  05/23/2022 23 15 - 41 U/L Final   SGOT(AST)  Date Value Ref Range Status  09/01/2014 20 15 - 37 Unit/L Final         Passed - ALT in normal range and within 360 days    ALT  Date Value Ref Range Status  05/23/2022 19 0 - 44 U/L Final   SGPT (ALT)  Date Value Ref Range Status  09/01/2014 24 U/L Final     Comment:    14-63 NOTE: New Reference Range 04/07/14          Passed - Completed PHQ-2 or PHQ-9 in the last 360 days       atorvastatin (LIPITOR) 40 MG tablet [Pharmacy Med Name: ATORVASTATIN CALCIUM 40 MG TAB] 30 tablet 0    Sig: TAKE 1 TABLET BY MOUTH BEDTIME     Cardiovascular:  Antilipid - Statins Failed - 07/27/2022  5:40 PM      Failed - Lipid Panel in normal range within the last 12 months    Cholesterol, Total  Date Value Ref Range Status  06/07/2015 177 100 - 199 mg/dL Final   Cholesterol  Date Value Ref Range Status  08/01/2021 135 <200 mg/dL Final  09/02/2014 141 0 - 200 mg/dL Final   Ldl Cholesterol, Calc  Date Value Ref Range Status  09/02/2014 73 0 - 100 mg/dL Final   LDL Cholesterol (Calc)  Date Value Ref  Range Status  08/01/2021 53 mg/dL (calc) Final    Comment:    Reference range: <100 . Desirable range <100 mg/dL for primary prevention;   <70 mg/dL for patients with CHD or diabetic patients  with > or = 2 CHD risk factors. [name] LDL-C is now calculated using the Martin-Hopkins  calculation, which is a validated novel method providing  better accuracy than the Friedewald equation in the  estimation of LDL-C.  Cresenciano Genre et al. Annamaria Helling. 3300;762(26): 2061-2068  (http://education.[employer].com/faq/FAQ164)    HDL Cholesterol  Date Value Ref Range Status  09/02/2014 37 (L) 40 - 60 mg/dL Final   HDL  Date Value Ref Range Status  08/01/2021 65 > OR = 50 mg/dL Final  06/07/2015 56 >39 mg/dL Final    Comment:    According to ATP-III Guidelines, HDL-C >59 mg/dL is considered a negative risk factor for CHD.    Triglycerides  Date Value Ref Range Status  08/01/2021 89 <150 mg/dL Final  09/02/2014 155 0 - 200 mg/dL Final         Passed - Patient is not pregnant      Passed - Valid encounter within last 12 months    Recent Outpatient Visits           9 months ago Nonspecific chest pain   [facility] [place], [place], NP   9  months ago Chest pain, unspecified type   [facility] [place], Coralie Keens, NP   12 months ago Controlled type 2 diabetes mellitus with complication, without long-term current use of insulin [facility])   [facility], Coralie Keens, NP   1 year ago Chronic right shoulder pain   [facility] [place], Coralie Keens, NP   1 year ago Aortic atherosclerosis [facility])   [facility], Coralie Keens, NP       Future Appointments             In 4 days [place], Coralie Keens, NP [facility], [facility]

## 2022-07-27 NOTE — Telephone Encounter (Signed)
1150 am.  Return call made to Sherlynn Stalls, [place] with [facility].    Verbal orders given to OT for week 2.

## 2022-07-27 NOTE — Telephone Encounter (Signed)
Called pt and made appt for 07/31/2022.

## 2022-07-31 ENCOUNTER — Ambulatory Visit: Payer: Medicare Other | Admitting: Internal Medicine

## 2022-07-31 DIAGNOSIS — C679 Malignant neoplasm of bladder, unspecified: Secondary | ICD-10-CM | POA: Diagnosis not present

## 2022-07-31 DIAGNOSIS — Z8673 Personal history of transient ischemic attack (TIA), and cerebral infarction without residual deficits: Secondary | ICD-10-CM | POA: Diagnosis not present

## 2022-07-31 DIAGNOSIS — F17200 Nicotine dependence, unspecified, uncomplicated: Secondary | ICD-10-CM | POA: Diagnosis not present

## 2022-07-31 DIAGNOSIS — J410 Simple chronic bronchitis: Secondary | ICD-10-CM | POA: Diagnosis not present

## 2022-07-31 NOTE — Progress Notes (Deleted)
BCG Bladder Instillation  BCG # 1/6  Due to Bladder Cancer patient is present today for a BCG treatment. Patient was cleaned and prepped in a sterile fashion with betadine. A 14 FR catheter was inserted, urine return was noted 30 ml, urine was yellow clear in color.  50ml of reconstituted BCG was instilled into the bladder. The catheter was then removed. Patient tolerated well, no complications were noted  Performed by: Aliza Moret, PA-C and Jessica Qualls, CMA   Follow up/ Additional notes: Next week for #2/6 BCG  

## 2022-08-01 ENCOUNTER — Ambulatory Visit: Payer: Medicare Other | Admitting: Urology

## 2022-08-01 DIAGNOSIS — F17200 Nicotine dependence, unspecified, uncomplicated: Secondary | ICD-10-CM | POA: Diagnosis not present

## 2022-08-01 DIAGNOSIS — Z8673 Personal history of transient ischemic attack (TIA), and cerebral infarction without residual deficits: Secondary | ICD-10-CM | POA: Diagnosis not present

## 2022-08-01 DIAGNOSIS — J410 Simple chronic bronchitis: Secondary | ICD-10-CM | POA: Diagnosis not present

## 2022-08-01 DIAGNOSIS — C679 Malignant neoplasm of bladder, unspecified: Secondary | ICD-10-CM | POA: Diagnosis not present

## 2022-08-03 DIAGNOSIS — F17200 Nicotine dependence, unspecified, uncomplicated: Secondary | ICD-10-CM | POA: Diagnosis not present

## 2022-08-03 DIAGNOSIS — Z8673 Personal history of transient ischemic attack (TIA), and cerebral infarction without residual deficits: Secondary | ICD-10-CM | POA: Diagnosis not present

## 2022-08-03 DIAGNOSIS — C679 Malignant neoplasm of bladder, unspecified: Secondary | ICD-10-CM | POA: Diagnosis not present

## 2022-08-03 DIAGNOSIS — J410 Simple chronic bronchitis: Secondary | ICD-10-CM | POA: Diagnosis not present

## 2022-08-07 ENCOUNTER — Ambulatory Visit: Payer: Medicare Other | Admitting: Physician Assistant

## 2022-08-07 ENCOUNTER — Other Ambulatory Visit: Payer: Self-pay | Admitting: Internal Medicine

## 2022-08-07 DIAGNOSIS — G4700 Insomnia, unspecified: Secondary | ICD-10-CM

## 2022-08-07 MED ORDER — TRAZODONE HCL 50 MG PO TABS: 50.0000 mg | ORAL_TABLET | Freq: Every day | ORAL | 0 refills | Status: DC

## 2022-08-07 NOTE — Telephone Encounter (Signed)
Medication Refill - Medication: One touch verio test strips   traZODone (DESYREL) 50 MG tablet  Cyclobenzaprine   And  famotidine (PEPCID) tablet 20 mg      Has the patient contacted their pharmacy? Yes.   (Agent: If no, request that the patient contact the pharmacy for the refill. If patient does not wish to contact the pharmacy document the reason why and proceed with request.) (Agent: If yes, when and what did the pharmacy advise?)  Preferred Pharmacy (with phone number or street name): [name]  Has the patient been seen for an appointment in the last year OR does the patient have an upcoming appointment? Yes.    Agent: Please be advised that RX refills may take up to 3 business days. We ask that you follow-up with your pharmacy.

## 2022-08-07 NOTE — Telephone Encounter (Signed)
One touch test strips, flexeril and pepcid are not on current list, routing for review.

## 2022-08-08 ENCOUNTER — Encounter: Payer: Self-pay | Admitting: Urology

## 2022-08-08 DIAGNOSIS — J410 Simple chronic bronchitis: Secondary | ICD-10-CM | POA: Diagnosis not present

## 2022-08-08 DIAGNOSIS — Z8673 Personal history of transient ischemic attack (TIA), and cerebral infarction without residual deficits: Secondary | ICD-10-CM | POA: Diagnosis not present

## 2022-08-08 DIAGNOSIS — F17200 Nicotine dependence, unspecified, uncomplicated: Secondary | ICD-10-CM | POA: Diagnosis not present

## 2022-08-08 DIAGNOSIS — C679 Malignant neoplasm of bladder, unspecified: Secondary | ICD-10-CM | POA: Diagnosis not present

## 2022-08-11 DIAGNOSIS — F17200 Nicotine dependence, unspecified, uncomplicated: Secondary | ICD-10-CM | POA: Diagnosis not present

## 2022-08-11 DIAGNOSIS — Z8673 Personal history of transient ischemic attack (TIA), and cerebral infarction without residual deficits: Secondary | ICD-10-CM | POA: Diagnosis not present

## 2022-08-11 DIAGNOSIS — C679 Malignant neoplasm of bladder, unspecified: Secondary | ICD-10-CM | POA: Diagnosis not present

## 2022-08-11 DIAGNOSIS — J410 Simple chronic bronchitis: Secondary | ICD-10-CM | POA: Diagnosis not present

## 2022-08-14 DIAGNOSIS — C679 Malignant neoplasm of bladder, unspecified: Secondary | ICD-10-CM | POA: Diagnosis not present

## 2022-08-14 DIAGNOSIS — J410 Simple chronic bronchitis: Secondary | ICD-10-CM | POA: Diagnosis not present

## 2022-08-14 DIAGNOSIS — Z8673 Personal history of transient ischemic attack (TIA), and cerebral infarction without residual deficits: Secondary | ICD-10-CM | POA: Diagnosis not present

## 2022-08-14 DIAGNOSIS — F17200 Nicotine dependence, unspecified, uncomplicated: Secondary | ICD-10-CM | POA: Diagnosis not present

## 2022-08-14 NOTE — Progress Notes (Unsigned)
No show

## 2022-08-15 ENCOUNTER — Telehealth: Payer: Self-pay

## 2022-08-15 ENCOUNTER — Ambulatory Visit (INDEPENDENT_AMBULATORY_CARE_PROVIDER_SITE_OTHER): Payer: Medicare Other | Admitting: Urology

## 2022-08-15 DIAGNOSIS — C678 Malignant neoplasm of overlapping sites of bladder: Secondary | ICD-10-CM

## 2022-08-15 NOTE — Telephone Encounter (Signed)
Eloise from [employer] informed me that the driver contacted Kyonna at 8 am to confirm she was to be picked up at 9 am for an appt at [facility]. Pt agreed. Driver arrived and honked several times with no response. He went to the door and knocked with no response.   Called pt - n/a went straight to VM.

## 2022-08-21 ENCOUNTER — Ambulatory Visit: Payer: Medicare Other | Admitting: Physician Assistant

## 2022-08-22 ENCOUNTER — Inpatient Hospital Stay: Payer: Medicare Other | Attending: Oncology

## 2022-08-22 ENCOUNTER — Inpatient Hospital Stay: Payer: Medicare Other

## 2022-08-22 ENCOUNTER — Other Ambulatory Visit: Payer: Self-pay

## 2022-08-22 DIAGNOSIS — D472 Monoclonal gammopathy: Secondary | ICD-10-CM | POA: Diagnosis not present

## 2022-08-22 DIAGNOSIS — R803 Bence Jones proteinuria: Secondary | ICD-10-CM

## 2022-08-22 DIAGNOSIS — Z23 Encounter for immunization: Secondary | ICD-10-CM | POA: Insufficient documentation

## 2022-08-22 DIAGNOSIS — E538 Deficiency of other specified B group vitamins: Secondary | ICD-10-CM | POA: Diagnosis not present

## 2022-08-22 DIAGNOSIS — D500 Iron deficiency anemia secondary to blood loss (chronic): Secondary | ICD-10-CM | POA: Insufficient documentation

## 2022-08-22 DIAGNOSIS — F17210 Nicotine dependence, cigarettes, uncomplicated: Secondary | ICD-10-CM | POA: Insufficient documentation

## 2022-08-22 DIAGNOSIS — R809 Proteinuria, unspecified: Secondary | ICD-10-CM | POA: Diagnosis not present

## 2022-08-22 DIAGNOSIS — Z79899 Other long term (current) drug therapy: Secondary | ICD-10-CM | POA: Diagnosis not present

## 2022-08-22 LAB — COMPREHENSIVE METABOLIC PANEL
ALT: 17 U/L (ref 0–44)
AST: 20 U/L (ref 15–41)
Albumin: 3.6 g/dL (ref 3.5–5.0)
Alkaline Phosphatase: 63 U/L (ref 38–126)
Anion gap: 8 (ref 5–15)
BUN: 20 mg/dL (ref 8–23)
CO2: 27 mmol/L (ref 22–32)
Calcium: 9.3 mg/dL (ref 8.9–10.3)
Chloride: 104 mmol/L (ref 98–111)
Creatinine, Ser: 0.77 mg/dL (ref 0.44–1.00)
GFR, Estimated: >60 mL/min (ref >60)
Glucose, Bld: 128 mg/dL — ABNORMAL HIGH (ref 70–99)
Potassium: 4 mmol/L (ref 3.5–5.1)
Sodium: 139 mmol/L (ref 135–145)
Total Bilirubin: 0.4 mg/dL (ref 0.3–1.2)
Total Protein: 7.1 g/dL (ref 6.5–8.1)

## 2022-08-22 LAB — CBC WITH DIFFERENTIAL/PLATELET
Abs Immature Granulocytes: 0.02 10*3/uL (ref 0.00–0.07)
Basophils Absolute: 0 10*3/uL (ref 0.0–0.1)
Basophils Relative: 0 %
Eosinophils Absolute: 0.1 10*3/uL (ref 0.0–0.5)
Eosinophils Relative: 2 %
HCT: 27.1 % — ABNORMAL LOW (ref 36.0–46.0)
Hemoglobin: 8.5 g/dL — ABNORMAL LOW (ref 12.0–15.0)
Immature Granulocytes: 0 %
Lymphocytes Relative: 29 %
Lymphs Abs: 1.7 10*3/uL (ref 0.7–4.0)
MCH: 27.3 pg (ref 26.0–34.0)
MCHC: 31.4 g/dL (ref 30.0–36.0)
MCV: 87.1 fL (ref 80.0–100.0)
Monocytes Absolute: 0.3 10*3/uL (ref 0.1–1.0)
Monocytes Relative: 5 %
Neutro Abs: 3.6 10*3/uL (ref 1.7–7.7)
Neutrophils Relative %: 64 %
Platelets: 307 10*3/uL (ref 150–400)
RBC: 3.11 MIL/uL — ABNORMAL LOW (ref 3.87–5.11)
RDW: 14.6 % (ref 11.5–15.5)
WBC: 5.8 10*3/uL (ref 4.0–10.5)
nRBC: 0 % (ref 0.0–0.2)

## 2022-08-23 LAB — KAPPA/LAMBDA LIGHT CHAINS
Kappa free light chain: 63.1 mg/L — ABNORMAL HIGH (ref 3.3–19.4)
Kappa, lambda light chain ratio: 4.93 — ABNORMAL HIGH (ref 0.26–1.65)
Lambda free light chains: 12.8 mg/L (ref 5.7–26.3)

## 2022-08-24 DIAGNOSIS — E538 Deficiency of other specified B group vitamins: Secondary | ICD-10-CM | POA: Diagnosis not present

## 2022-08-24 DIAGNOSIS — R634 Abnormal weight loss: Secondary | ICD-10-CM | POA: Diagnosis not present

## 2022-08-24 DIAGNOSIS — I700 Atherosclerosis of aorta: Secondary | ICD-10-CM | POA: Diagnosis not present

## 2022-08-24 DIAGNOSIS — I69354 Hemiplegia and hemiparesis following cerebral infarction affecting left non-dominant side: Secondary | ICD-10-CM | POA: Diagnosis not present

## 2022-08-24 DIAGNOSIS — M8580 Other specified disorders of bone density and structure, unspecified site: Secondary | ICD-10-CM | POA: Diagnosis not present

## 2022-08-24 DIAGNOSIS — M5090 Cervical disc disorder, unspecified, unspecified cervical region: Secondary | ICD-10-CM | POA: Diagnosis not present

## 2022-08-24 DIAGNOSIS — J4489 Other specified chronic obstructive pulmonary disease: Secondary | ICD-10-CM | POA: Diagnosis not present

## 2022-08-24 DIAGNOSIS — J432 Centrilobular emphysema: Secondary | ICD-10-CM | POA: Diagnosis not present

## 2022-08-24 DIAGNOSIS — G4700 Insomnia, unspecified: Secondary | ICD-10-CM | POA: Diagnosis not present

## 2022-08-24 DIAGNOSIS — C679 Malignant neoplasm of bladder, unspecified: Secondary | ICD-10-CM | POA: Diagnosis not present

## 2022-08-24 DIAGNOSIS — E1159 Type 2 diabetes mellitus with other circulatory complications: Secondary | ICD-10-CM | POA: Diagnosis not present

## 2022-08-24 DIAGNOSIS — M1990 Unspecified osteoarthritis, unspecified site: Secondary | ICD-10-CM | POA: Diagnosis not present

## 2022-08-24 DIAGNOSIS — R296 Repeated falls: Secondary | ICD-10-CM | POA: Diagnosis not present

## 2022-08-24 DIAGNOSIS — Z9181 History of falling: Secondary | ICD-10-CM | POA: Diagnosis not present

## 2022-08-24 DIAGNOSIS — E1142 Type 2 diabetes mellitus with diabetic polyneuropathy: Secondary | ICD-10-CM | POA: Diagnosis not present

## 2022-08-24 DIAGNOSIS — I6529 Occlusion and stenosis of unspecified carotid artery: Secondary | ICD-10-CM | POA: Diagnosis not present

## 2022-08-24 DIAGNOSIS — F17210 Nicotine dependence, cigarettes, uncomplicated: Secondary | ICD-10-CM | POA: Diagnosis not present

## 2022-08-24 DIAGNOSIS — M069 Rheumatoid arthritis, unspecified: Secondary | ICD-10-CM | POA: Diagnosis not present

## 2022-08-24 DIAGNOSIS — I152 Hypertension secondary to endocrine disorders: Secondary | ICD-10-CM | POA: Diagnosis not present

## 2022-08-24 DIAGNOSIS — E785 Hyperlipidemia, unspecified: Secondary | ICD-10-CM | POA: Diagnosis not present

## 2022-08-24 DIAGNOSIS — E1169 Type 2 diabetes mellitus with other specified complication: Secondary | ICD-10-CM | POA: Diagnosis not present

## 2022-08-24 DIAGNOSIS — Z7982 Long term (current) use of aspirin: Secondary | ICD-10-CM | POA: Diagnosis not present

## 2022-08-24 DIAGNOSIS — K219 Gastro-esophageal reflux disease without esophagitis: Secondary | ICD-10-CM | POA: Diagnosis not present

## 2022-08-24 DIAGNOSIS — D630 Anemia in neoplastic disease: Secondary | ICD-10-CM | POA: Diagnosis not present

## 2022-08-25 ENCOUNTER — Other Ambulatory Visit: Payer: Self-pay | Admitting: Internal Medicine

## 2022-08-25 DIAGNOSIS — E785 Hyperlipidemia, unspecified: Secondary | ICD-10-CM

## 2022-08-28 ENCOUNTER — Ambulatory Visit (INDEPENDENT_AMBULATORY_CARE_PROVIDER_SITE_OTHER): Payer: Medicare Other | Admitting: Physician Assistant

## 2022-08-28 VITALS — BP 121/75 | HR 88 | Ht 64.0 in | Wt 113.0 lb

## 2022-08-28 DIAGNOSIS — R300 Dysuria: Secondary | ICD-10-CM

## 2022-08-28 DIAGNOSIS — C678 Malignant neoplasm of overlapping sites of bladder: Secondary | ICD-10-CM | POA: Diagnosis not present

## 2022-08-28 DIAGNOSIS — R310 Gross hematuria: Secondary | ICD-10-CM | POA: Diagnosis not present

## 2022-08-28 LAB — URINALYSIS, COMPLETE
Bilirubin, UA: NEGATIVE
Glucose, UA: NEGATIVE
Ketones, UA: NEGATIVE
Nitrite, UA: POSITIVE — AB
RBC, UA: NEGATIVE
Specific Gravity, UA: 1.03 (ref 1.005–1.030)
Urobilinogen, Ur: 1 mg/dL (ref 0.2–1.0)
pH, UA: 5.5 (ref 5.0–7.5)

## 2022-08-28 LAB — MICROSCOPIC EXAMINATION: WBC, UA: >30 /hpf — AB (ref 0–5)

## 2022-08-28 MED ORDER — SULFAMETHOXAZOLE-TRIMETHOPRIM 800-160 MG PO TABS: 1.0000 | ORAL_TABLET | Freq: Two times a day (BID) | ORAL | 0 refills | Status: AC

## 2022-08-28 NOTE — Telephone Encounter (Signed)
Requested medication (s) are due for refill today: yes  Requested medication (s) are on the active medication list: yes  Last refill:  07/27/22  Future visit scheduled: no  Notes to clinic:  Unable to refill per protocol, courtesy refill already given, routing for provider approval.      Requested Prescriptions  Pending Prescriptions Disp Refills   atorvastatin (LIPITOR) 40 MG tablet [Pharmacy Med Name: ATORVASTATIN CALCIUM 40 MG TAB] 30 tablet 0    Sig: TAKE 1 TABLET BY MOUTH BEDTIME     Cardiovascular:  Antilipid - Statins Failed - 08/25/2022  4:28 PM      Failed - Lipid Panel in normal range within the last 12 months    Cholesterol, Total  Date Value Ref Range Status  06/07/2015 177 100 - 199 mg/dL Final   Cholesterol  Date Value Ref Range Status  08/01/2021 135 <200 mg/dL Final  09/02/2014 141 0 - 200 mg/dL Final   Ldl Cholesterol, Calc  Date Value Ref Range Status  09/02/2014 73 0 - 100 mg/dL Final   LDL Cholesterol (Calc)  Date Value Ref Range Status  08/01/2021 53 mg/dL (calc) Final    Comment:    Reference range: <100 . Desirable range <100 mg/dL for primary prevention;   <70 mg/dL for patients with CHD or diabetic patients  with > or = 2 CHD risk factors. [name] LDL-C is now calculated using the Martin-Hopkins  calculation, which is a validated novel method providing  better accuracy than the Friedewald equation in the  estimation of LDL-C.  Cresenciano Genre et al. Annamaria Helling. 7616;073(71): 2061-2068  (http://education.[facility].com/faq/FAQ164)    HDL Cholesterol  Date Value Ref Range Status  09/02/2014 37 (L) 40 - 60 mg/dL Final   HDL  Date Value Ref Range Status  08/01/2021 65 > OR = 50 mg/dL Final  06/07/2015 56 >39 mg/dL Final    Comment:    According to ATP-III Guidelines, HDL-C >59 mg/dL is considered a negative risk factor for CHD.    Triglycerides  Date Value Ref Range Status  08/01/2021 89 <150 mg/dL Final  09/02/2014 155 0 - 200 mg/dL Final          Passed - Patient is not pregnant      Passed - Valid encounter within last 12 months    Recent Outpatient Visits           10 months ago Nonspecific chest pain   [facility] [place], [place], NP   10 months ago Chest pain, unspecified type   [name] [place], Coralie Keens, NP   1 year ago Controlled type 2 diabetes mellitus with complication, without long-term current use of insulin [facility])   [facility], Coralie Keens, NP   1 year ago Chronic right shoulder pain   [name] [place], Coralie Keens, NP   1 year ago Aortic atherosclerosis [facility])   [facility] [place], Coralie Keens, NP               buPROPion (WELLBUTRIN XL) 150 MG 24 hr tablet [Pharmacy Med Name: BUPROPION HCL ER (XL) 150 MG TAB] 30 tablet 0    Sig: TAKE 1 TABLET BY MOUTH ONCE DAILY ** NEED TO KEEP UPCOMING APPOINTMENT!!1     Psychiatry: Antidepressants - bupropion Failed - 08/25/2022  4:28 PM      Failed - Last BP in normal range    BP Readings from Last 1 Encounters:  06/23/22 [name])  131/90         Failed - Valid encounter within last 6 months    Recent Outpatient Visits           10 months ago Nonspecific chest pain   [facility] [place], Coralie Keens, NP   10 months ago Chest pain, unspecified type   [facility] [place], Coralie Keens, NP   1 year ago Controlled type 2 diabetes mellitus with complication, without long-term current use of insulin [facility])   [facility] [place], Coralie Keens, NP   1 year ago Chronic right shoulder pain   [name] [place], Coralie Keens, NP   1 year ago Aortic atherosclerosis [facility])   [name], Coralie Keens, NP              Passed - Cr in normal range and within 360 days    Creat  Date Value Ref Range Status  08/01/2021 0.85 0.60 - 1.00 mg/dL Final   Creatinine, Ser  Date Value Ref Range Status  08/22/2022 0.77 0.44 - 1.00 mg/dL  Final   Creatinine, Urine  Date Value Ref Range Status  05/31/2021 89 20 - 275 mg/dL Final         Passed - AST in normal range and within 360 days    AST  Date Value Ref Range Status  08/22/2022 20 15 - 41 U/L Final   SGOT(AST)  Date Value Ref Range Status  09/01/2014 20 15 - 37 Unit/L Final         Passed - ALT in normal range and within 360 days    ALT  Date Value Ref Range Status  08/22/2022 17 0 - 44 U/L Final   SGPT (ALT)  Date Value Ref Range Status  09/01/2014 24 U/L Final    Comment:    14-63 NOTE: New Reference Range 04/07/14          Passed - Completed PHQ-2 or PHQ-9 in the last 360 days

## 2022-08-28 NOTE — Progress Notes (Unsigned)
Patient presented to the clinic today for a scheduled BCG instillation.  Urine grossly infected on UA, culture pending.  Patient reports dysuria since having her Foley catheter removed on 06/29/2022.  Will treat for acute UTI today.  Prescription for extreme DS twice daily x 5 days sent to pharmacy.  Patient to return to clinic next week for next scheduled BCG treatment.  We will add an additional treatment to her scheduled series to make up for today's missed instillation.  Additionally, patient wonders when she is starting chemotherapy for bladder cancer.  I clarified that she is not recommended to undergo chemotherapy for her bladder cancer and that her bladder cancer treatment is intravesical BCG.  She has no showed her first 4 appointments for this despite [place] arranging transportation for her to come to our clinic for treatments, see prior notes.  She is adamant today that she does desire BCG.  Will arrange transportation again even though she no showed her first arranged taxi.  If she no-shows her arranged taxi a second time, we will not arrange any further rides.  Notably, she was able to come to clinic today via a [employer] that was seemingly arranged by her health insurance company.  Today she also mentions that she is being seen in the cancer center tomorrow for her bladder cancer.  Chart review reveals that this is actually a 53-monthfollow-up with Dr. YTasia Catchingsfor management of her chronic anemia and Bence Jones proteins in the urine.  She does not need to see the cancer center for management of her bladder cancer.  I explained this to her and she expressed understanding.  SDebroah Loop PA-C 08/28/22 11:35 AM   Return for Will call to arrange BCG treatments and transportation.   I spent 25 minutes on the day of the encounter to include pre-visit record review, face-to-face time with the patient, and post-visit ordering of tests.

## 2022-08-29 ENCOUNTER — Inpatient Hospital Stay (HOSPITAL_BASED_OUTPATIENT_CLINIC_OR_DEPARTMENT_OTHER): Payer: Medicare Other | Admitting: Oncology

## 2022-08-29 ENCOUNTER — Other Ambulatory Visit: Payer: Self-pay

## 2022-08-29 ENCOUNTER — Inpatient Hospital Stay: Payer: Medicare Other

## 2022-08-29 VITALS — BP 129/82 | HR 80 | Temp 97.1°F | Ht 64.5 in | Wt 113.0 lb

## 2022-08-29 DIAGNOSIS — C679 Malignant neoplasm of bladder, unspecified: Secondary | ICD-10-CM | POA: Diagnosis not present

## 2022-08-29 DIAGNOSIS — R803 Bence Jones proteinuria: Secondary | ICD-10-CM | POA: Diagnosis not present

## 2022-08-29 DIAGNOSIS — Z7189 Other specified counseling: Secondary | ICD-10-CM | POA: Diagnosis not present

## 2022-08-29 DIAGNOSIS — Z720 Tobacco use: Secondary | ICD-10-CM | POA: Diagnosis not present

## 2022-08-29 DIAGNOSIS — Z79899 Other long term (current) drug therapy: Secondary | ICD-10-CM | POA: Diagnosis not present

## 2022-08-29 DIAGNOSIS — Z23 Encounter for immunization: Secondary | ICD-10-CM | POA: Diagnosis not present

## 2022-08-29 DIAGNOSIS — E538 Deficiency of other specified B group vitamins: Secondary | ICD-10-CM

## 2022-08-29 DIAGNOSIS — D500 Iron deficiency anemia secondary to blood loss (chronic): Secondary | ICD-10-CM | POA: Insufficient documentation

## 2022-08-29 DIAGNOSIS — F17210 Nicotine dependence, cigarettes, uncomplicated: Secondary | ICD-10-CM | POA: Diagnosis not present

## 2022-08-29 DIAGNOSIS — R809 Proteinuria, unspecified: Secondary | ICD-10-CM | POA: Diagnosis not present

## 2022-08-29 DIAGNOSIS — D472 Monoclonal gammopathy: Secondary | ICD-10-CM | POA: Diagnosis not present

## 2022-08-29 LAB — IRON AND TIBC
Iron: 39 ug/dL (ref 28–170)
Saturation Ratios: 8 % — ABNORMAL LOW (ref 10.4–31.8)
TIBC: 500 ug/dL — ABNORMAL HIGH (ref 250–450)
UIBC: 461 ug/dL

## 2022-08-29 LAB — MULTIPLE MYELOMA PANEL, SERUM
Albumin SerPl Elph-Mcnc: 3.5 g/dL (ref 2.9–4.4)
Albumin/Glob SerPl: 1.2 (ref 0.7–1.7)
Alpha 1: 0.3 g/dL (ref 0.0–0.4)
Alpha2 Glob SerPl Elph-Mcnc: 0.9 g/dL (ref 0.4–1.0)
B-Globulin SerPl Elph-Mcnc: 1 g/dL (ref 0.7–1.3)
Gamma Glob SerPl Elph-Mcnc: 0.8 g/dL (ref 0.4–1.8)
Globulin, Total: 3.1 g/dL (ref 2.2–3.9)
IgA: 245 mg/dL (ref 64–422)
IgG (Immunoglobin G), Serum: 823 mg/dL (ref 586–1602)
IgM (Immunoglobulin M), Srm: 150 mg/dL (ref 26–217)
Total Protein ELP: 6.6 g/dL (ref 6.0–8.5)

## 2022-08-29 LAB — FOLATE: Folate: 12.4 ng/mL (ref >5.9)

## 2022-08-29 LAB — VITAMIN B12: Vitamin B-12: 277 pg/mL (ref 180–914)

## 2022-08-29 LAB — FERRITIN: Ferritin: 7 ng/mL — ABNORMAL LOW (ref 11–307)

## 2022-08-29 LAB — LACTATE DEHYDROGENASE: LDH: 94 U/L — ABNORMAL LOW (ref 98–192)

## 2022-08-29 MED ORDER — INFLUENZA VAC A&B SA ADJ QUAD 0.5 ML IM PRSY: 0.5000 mL | PREFILLED_SYRINGE | INTRAMUSCULAR | Status: DC

## 2022-08-29 MED ORDER — INFLUENZA VAC A&B SA ADJ QUAD 0.5 ML IM PRSY
0.5000 mL | PREFILLED_SYRINGE | INTRAMUSCULAR | Status: AC
  Administered 2022-08-29: 0.5 mL via INTRAMUSCULAR

## 2022-08-29 NOTE — Assessment & Plan Note (Signed)
Recommend influenza vaccination. She agrees

## 2022-08-29 NOTE — Assessment & Plan Note (Signed)
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 Labs reviewed and discussed with patient Free light chain ratio is stable.  M protein negative. 24-hour urine protein electrophoresis, result is pending. Continue observation.

## 2022-08-29 NOTE — Assessment & Plan Note (Signed)
Status post TURBT on 06/23/2022 Pathology showed invasive high-grade urothelial carcinoma, carcinoma invades into the lamina propria.  Muscular propria is present and not involved. Recommend patient to continue follow-up with urology for intrathecal BCG therapy.

## 2022-08-29 NOTE — Assessment & Plan Note (Signed)
Recommend smoke cessation.  

## 2022-08-29 NOTE — Assessment & Plan Note (Signed)
Acute on chronic anemia, Additional blood work up test today showed decrease ferritin and iron saturation.  Consistent with iron deficiency anemia.  Likely due to recent hematuria. I discussed option of proceed with IV Venofer treatments. I discussed about the potential risks including but not limited to allergic reactions/infusion reactions including anaphylactic reactions, phlebitis, high blood pressure, wheezing, SOB, skin rash, weight gain, leg swelling, headache, nausea and fatigue, etc. patient agrees with the plan.  Plan IV venofer weekly x 4

## 2022-08-29 NOTE — Progress Notes (Signed)
Hematology/Oncology Progress note [phone] [phone]      Patient Care Team: Earlie Server, MD as PCP - General (Oncology) Steele Sizer, MD as Attending Physician (Family Medicine) Christene Lye, MD (General Surgery) Malfi, [name], FNP as Nurse Practitioner (Family Medicine) [name], LCSW as Social Worker (Licensed Clinical Social Worker)  ASSESSMENT & PLAN:   Iron deficiency anemia due to chronic blood loss Acute on chronic anemia, Additional blood work up test today showed decrease ferritin and iron saturation.  Consistent with iron deficiency anemia.  Likely due to recent hematuria. I discussed option of proceed with IV Venofer treatments. I discussed about the potential risks including but not limited to allergic reactions/infusion reactions including anaphylactic reactions, phlebitis, high blood pressure, wheezing, SOB, skin rash, weight gain, leg swelling, headache, nausea and fatigue, etc. patient agrees with the plan.  Plan IV venofer weekly x 4   Bence Jones protein present in urine Likely light chain MGUS or smoldering light chain myeloma. Labs reviewed and discussed with patient Free light chain ratio is stable.  M protein negative. 24-hour urine protein electrophoresis, result is pending. Continue observation.  Urothelial carcinoma of bladder ([place]) Status post TURBT on 06/23/2022 Pathology showed invasive high-grade urothelial carcinoma, carcinoma invades into the lamina propria.  Muscular propria is present and not involved. Recommend patient to continue follow-up with urology for intrathecal BCG therapy.  Tobacco use Recommend smoke cessation.  Need for prophylactic vaccination and inoculation against influenza Recommend influenza vaccination. She agrees   Orders Placed This Encounter  Procedures   Iron and TIBC([facility]/Sun[facility])    Standing Status:   Future    Number of Occurrences:   1    Standing Expiration  Date:   08/30/2023   Ferritin    Standing Status:   Future    Number of Occurrences:   1    Standing Expiration Date:   08/30/2023   Vitamin B12    Standing Status:   Future    Number of Occurrences:   1    Standing Expiration Date:   08/30/2023   Lactate dehydrogenase    Standing Status:   Future    Number of Occurrences:   1    Standing Expiration Date:   08/30/2023   Folate    Standing Status:   Future    Number of Occurrences:   1    Standing Expiration Date:   08/30/2023   Follow up in 4 months.  All questions were answered. The patient knows to call the clinic with any problems, questions or concerns.  Earlie Server, MD, PhD [facility] Hematology Oncology 08/29/2022    CHIEF COMPLAINTS/REASON FOR VISIT:  Follow up for anemia, bence jones protein   HISTORY OF PRESENTING ILLNESS:  Kendra [name] is a  76 y.o.  female with PMH listed below who was referred to me for evaluation of anemia Reviewed patient's recent labs that was done.  06/14/20 Labs revealed anemia with hemoglobin of 11.2  Reviewed patient's previous labs ordered by primary care physician's office, anemia is chronic onset , duration is since Jan 2021 No aggravating or improving factors.  Associated signs and symptoms: Patient reports fatigue. deneis  SOB with exertion.  Denies  easy bruising, hematochezia, hemoptysis, hematuria. She has had lost weight over the past 2 years. Context: History of GI bleeding: denies               History of Chronic kidney disease: deneis  History of autoimmune disease: Rheumatoid arthritis               Last colonoscopy: 2007                Extensive smoking history   INTERVAL HISTORY Kendra [name] is a 76 y.o. female who has above history reviewed by me today presents for follow up visit for management of anemia and MGUS. During interval, patient has developed hematuria. She underwent urology workup with cystoscopy and found to have bladder cancer.   Status post TURBT on 06/23/2022 Pathology showed invasive high-grade urothelial carcinoma, carcinoma invades into the lamina propria.  Muscular propria is present and not involved. She follows up with urology and there was plan for intrathecal BCG.  Patient was also prescribed with a course of antibiotics for UTI treatments.  She has not started antibiotics yet.  Denies any dysuria symptoms today.  Patient reports feeling fatigued.  Review of Systems  Constitutional:  Positive for fatigue. Negative for appetite change, chills and fever.  HENT:   Negative for hearing loss and voice change.   Eyes:  Negative for eye problems.  Respiratory:  Negative for chest tightness, cough and wheezing.   Cardiovascular:  Negative for chest pain.  Gastrointestinal:  Negative for abdominal distention, abdominal pain and blood in stool.  Endocrine: Negative for hot flashes.  Genitourinary:  Negative for difficulty urinating and frequency.   Musculoskeletal:  Positive for arthralgias.  Skin:  Negative for itching and rash.  Neurological:  Negative for extremity weakness.  Hematological:  Negative for adenopathy.  Psychiatric/Behavioral:  Negative for confusion.      MEDICAL HISTORY:  Past Medical History:  Diagnosis Date   Anemia    Aortic atherosclerosis (HCC)    Asthma    Back pain    Bladder tumor    Carotid artery stenosis    Cataract    Cervical disc disorder    COPD (chronic obstructive pulmonary disease) (HCC)    Depression    Diabetes (HCC)    type II   Diabetic neuropathy (HCC)    Dyspnea    with exertion   GERD (gastroesophageal reflux disease)    Hematuria    History of kidney stones    per patient "can't remember the year"   Hyperlipidemia    Hypertension    per patient "take meds for it"   Insomnia    Neuropathy    Osteoarthritis    Osteopenia    Pneumonia 2018   per patient   Reflux    Rheumatoid arthritis ([place])    Stroke ([place]) 2015   and again 2017  - Weakness in  left leg, staggers w/ walking, vision    Tenosynovitis     SURGICAL HISTORY: Past Surgical History:  Procedure Laterality Date   BLADDER INSTILLATION N/A 06/23/2022   Procedure: BLADDER INSTILLATION OF GEMCITABINE;  Surgeon: Billey Co, MD;  Location: [facility];  Service: Urology;  Laterality: N/A;   BREAST BIOPSY Right    Fatty Tumor   ENDARTERECTOMY Right 10/21/2019   Procedure: ENDARTERECTOMY CAROTID RIGHT;  Surgeon: [name], MD;  Location: [place];  Service: Vascular;  Laterality: Right;   ENDARTERECTOMY Left 12/30/2019   Procedure: LEFT CAROTID ENDARTERECTOMY;  Surgeon: [name], MD;  Location: [place];  Service: Vascular;  Laterality: Left;   NECK SURGERY     OVARIAN CYST REMOVAL     PATCH ANGIOPLASTY Right 10/21/2019   Procedure: Patch Angioplasty Using [facility]  Patch Right Carotid;  Surgeon: [name], MD;  Location: [place];  Service: Vascular;  Laterality: Right;   PATCH ANGIOPLASTY Left 12/30/2019   Procedure: Patch Angioplasty Left Carotid;  Surgeon: [name], MD;  Location: [place];  Service: Vascular;  Laterality: Left;   TRANSURETHRAL RESECTION OF BLADDER TUMOR N/A 06/23/2022   Procedure: TRANSURETHRAL RESECTION OF BLADDER TUMOR (TURBT);  Surgeon: Billey Co, MD;  Location: [facility];  Service: Urology;  Laterality: N/A;    SOCIAL HISTORY: Social History   Socioeconomic History   Marital status: Widowed    Spouse name: Not on file   Number of children: 3   Years of education: 17   Highest education level: Not on file  Occupational History   Occupation: Disabled  Tobacco Use   Smoking status: Every Day    Packs/day: 0.25    Years: 61.00    Total pack years: 15.25    Types: Cigarettes   Smokeless tobacco: Former    Types: Snuff  Vaping Use   Vaping Use: Never used  Substance and Sexual Activity   Alcohol use: No   Drug use: No   Sexual activity: Never  Other Topics Concern   Not on  file  Social History Narrative   Not on file   Social Determinants of Health   Financial Resource Strain: Medium Risk (01/04/2022)   Overall Financial Resource Strain (CARDIA)    Difficulty of Paying Living Expenses: Somewhat hard  Food Insecurity: No Food Insecurity (05/08/2022)   Hunger Vital Sign    Worried About Running Out of Food in the Last Year: Never true    [place] in the Last Year: Never true  Transportation Needs: Unmet Transportation Needs (08/09/2022)   PRAPARE - Transportation    Lack of Transportation (Medical): Yes    Lack of Transportation (Non-Medical): Yes  Physical Activity: Inactive (11/14/2021)   Exercise Vital Sign    Days of Exercise per Week: 0 days    Minutes of Exercise per Session: 0 min  Stress: Stress Concern Present (01/04/2022)   [place] of Stress : Rather much  Social Connections: Socially Isolated (11/14/2021)   Social Connection and Isolation Panel [NHANES]    Frequency of Communication with Friends and Family: More than three times a week    Frequency of Social Gatherings with Friends and Family: More than three times a week    Attends Religious Services: Never    Marine scientist or Organizations: No    Attends Archivist Meetings: Never    Marital Status: Widowed  Intimate Partner Violence: Not At Risk (05/08/2022)   Humiliation, Afraid, Rape, and Kick questionnaire    Fear of Current or Ex-Partner: No    Emotionally Abused: No    Physically Abused: No    Sexually Abused: No    FAMILY HISTORY: Family History  Problem Relation Age of Onset   Stroke Father    Depression Father    Diabetes Mother    Hyperlipidemia Mother    Breast cancer Mother    Heart murmur Son    Heart murmur Son    Heart murmur Son     ALLERGIES:  is allergic to citalopram.  MEDICATIONS:  Current Outpatient Medications  Medication Sig Dispense Refill    Acetaminophen (TYLENOL 8 HOUR ARTHRITIS PAIN PO) Take 2 capsules by mouth in the morning, at noon, in the evening, and  at bedtime.     albuterol (VENTOLIN HFA) 108 (90 Base) MCG/ACT inhaler INHALE 1-2 PUFFS BY MOUTH EVERY 6 HOURS AS NEEDED WHEEZING/ SHORTNESS OF BREATH (Patient taking differently: 1-2 puffs every 6 (six) hours as needed. INHALE 1-2 PUFFS BY MOUTH EVERY 6 HOURS AS NEEDED WHEEZING/ SHORTNESS OF BREATH) 8.5 g 1   aspirin EC 81 MG tablet Take 1 tablet (81 mg total) by mouth daily. Swallow whole. 90 tablet 1   atorvastatin (LIPITOR) 40 MG tablet TAKE 1 TABLET BY MOUTH BEDTIME 30 tablet 0   blood glucose meter kit and supplies KIT Dispense based on patient and insurance preference. Use up to four times daily as directed. 1 each 0   Blood Glucose Monitoring Suppl (ONETOUCH VERIO) w/Device KIT USE AS DIRECTED 1 kit 0   buPROPion (WELLBUTRIN XL) 150 MG 24 hr tablet TAKE 1 TABLET BY MOUTH ONCE DAILY ** NEED TO KEEP UPCOMING APPOINTMENT!!1 30 tablet 0   glucose blood (ACCU-CHEK GUIDE) test strip Use as directed up to 4 times daily. DX: E11.9 400 each 0   TRELEGY ELLIPTA 100-62.5-25 MCG/ACT AEPB USE ONE INHALATION INTO THE LUNGS ONCE A DAY RINSE MOUTH AFTER EACH USE (Patient taking differently: Inhale 1 puff into the lungs at bedtime.) 60 each 2   Accu-Chek Softclix Lancets lancets USE AS DIRECTED. UP TO 4 TIMES A DAY DX: E11.9 (Patient not taking: Reported on 08/29/2022) 400 each 0   Lancet Devices (ONE TOUCH DELICA LANCING DEV) MISC 1 Device by Does not apply route 2 (two) times daily. (Patient not taking: Reported on 08/29/2022) 200 each 4   sulfamethoxazole-trimethoprim (BACTRIM DS) 800-160 MG tablet Take 1 tablet by mouth 2 (two) times daily for 5 days. 10 tablet 0   traZODone (DESYREL) 50 MG tablet Take 1 tablet (50 mg total) by mouth at bedtime. 90 tablet 0   No current facility-administered medications for this visit.     PHYSICAL EXAMINATION: ECOG PERFORMANCE STATUS: 1 - Symptomatic  but completely ambulatory Vitals:   08/29/22 1319  BP: 129/82  Pulse: 80  Temp: (!) 97.1 F (36.2 C)   Filed Weights   08/29/22 1319  Weight: 113 lb (51.3 kg)    Physical Exam Constitutional:      General: She is not in acute distress.    Comments: Thin built  HENT:     Head: Normocephalic and atraumatic.  Eyes:     General: No scleral icterus. Cardiovascular:     Rate and Rhythm: Normal rate and regular rhythm.     Heart sounds: Normal heart sounds.  Pulmonary:     Effort: Pulmonary effort is normal. No respiratory distress.     Breath sounds: No wheezing.  Abdominal:     General: Bowel sounds are normal. There is no distension.     Palpations: Abdomen is soft.  Musculoskeletal:        General: No deformity. Normal range of motion.     Cervical back: Normal range of motion and neck supple.  Skin:    General: Skin is warm and dry.     Findings: No erythema or rash.  Neurological:     Mental Status: She is alert. Mental status is at baseline.     Cranial Nerves: No cranial nerve deficit.     Coordination: Coordination normal.  Psychiatric:        Mood and Affect: Mood normal.      LABORATORY DATA:  I have reviewed the data as listed Lab Results  Component  Value Date   WBC 5.8 08/22/2022   HGB 8.5 (L) 08/22/2022   HCT 27.1 (L) 08/22/2022   MCV 87.1 08/22/2022   PLT 307 08/22/2022   Recent Labs    04/02/22 1948 05/23/22 1523 06/15/22 1223 06/17/22 0606 08/22/22 1209  NA 137 140 138 139 139  K 3.7 4.3 4.0 3.9 4.0  CL 101 104 103 108 104  CO2 _0 GLUCOSE 101* 160* 119* 108* 128*  BUN _1 CREATININE 0.73 0.82 0.77 0.82 0.77  CALCIUM 10.1 10.2 9.5 9.3 9.3  GFRNONAA >60 >60 >60 >60 >60  PROT 7.6 7.6  --   --  7.1  ALBUMIN 4.3 4.3  --   --  3.6  AST 21 23  --   --  20  ALT 16 19  --   --  17  ALKPHOS 62 68  --   --  63  BILITOT 0.5 0.6  --   --  0.4  BILIDIR <0.1  --   --   --   --   IBILI NOT CALCULATED  --   --   --   --      Iron/TIBC/Ferritin/ %Sat    Component Value Date/Time   IRON 39 08/29/2022 1402   TIBC 500 (H) 08/29/2022 1402   FERRITIN 7 (L) 08/29/2022 1402   IRONPCTSAT 8 (L) 08/29/2022 1402        ASSESSMENT & PLAN:  1. Need for prophylactic vaccination and inoculation against influenza   2. Bence Jones protein present in urine   3. B12 deficiency   4. Iron deficiency anemia due to chronic blood loss    Bence-Jones proteinuria   Vitamin B12 deficiency, vitamin B12 level has improved.  Repeat B12 level at the next visit.  # extensive smoking history: smoking cessation was discussed.  I have previously referred her to lung cancer screening program.  Orders Placed This Encounter  Procedures   Iron and TIBC([facility])    Standing Status:   Future    Number of Occurrences:   1    Standing Expiration Date:   08/30/2023   Ferritin    Standing Status:   Future    Number of Occurrences:   1    Standing Expiration Date:   08/30/2023   Vitamin B12    Standing Status:   Future    Number of Occurrences:   1    Standing Expiration Date:   08/30/2023   Lactate dehydrogenase    Standing Status:   Future    Number of Occurrences:   1    Standing Expiration Date:   08/30/2023   Folate    Standing Status:   Future    Number of Occurrences:   1    Standing Expiration Date:   08/30/2023    All questions were answered. The patient knows to call the clinic with any problems questions or concerns. Cc. [name], NP  Return of visit: 6 months  Earlie Server, MD, PhD 08/29/2022

## 2022-08-30 ENCOUNTER — Telehealth: Payer: Self-pay

## 2022-08-30 ENCOUNTER — Other Ambulatory Visit: Payer: Self-pay

## 2022-08-30 DIAGNOSIS — F17210 Nicotine dependence, cigarettes, uncomplicated: Secondary | ICD-10-CM | POA: Diagnosis not present

## 2022-08-30 DIAGNOSIS — D472 Monoclonal gammopathy: Secondary | ICD-10-CM | POA: Diagnosis not present

## 2022-08-30 DIAGNOSIS — Z23 Encounter for immunization: Secondary | ICD-10-CM | POA: Diagnosis not present

## 2022-08-30 DIAGNOSIS — R803 Bence Jones proteinuria: Secondary | ICD-10-CM

## 2022-08-30 DIAGNOSIS — E538 Deficiency of other specified B group vitamins: Secondary | ICD-10-CM | POA: Diagnosis not present

## 2022-08-30 DIAGNOSIS — D500 Iron deficiency anemia secondary to blood loss (chronic): Secondary | ICD-10-CM | POA: Diagnosis not present

## 2022-08-30 DIAGNOSIS — Z79899 Other long term (current) drug therapy: Secondary | ICD-10-CM | POA: Diagnosis not present

## 2022-08-30 DIAGNOSIS — R809 Proteinuria, unspecified: Secondary | ICD-10-CM | POA: Diagnosis not present

## 2022-08-30 NOTE — Telephone Encounter (Signed)
-----   Message from Earlie Server, MD sent at 08/29/2022  9:30 PM EST ----- Please let her know that iron level is low. I recommend IV venofer weekly x 5 Follow up in 3 months, labs prior to MD + venfoer. Labs are ordered.   Also, 5-HIAA urine test needs to be cancelled. [Wrong vernal order] Please check if they can check 24 hour UPEP instead, if not, please group urine test with other tests at next visit.

## 2022-08-30 NOTE — Telephone Encounter (Signed)
24 hour testing added to yesterday's labs.   Pt informed of MD recommendations and follow up plan and verbalized understanding.   Please schedule and inform pt of appts. She would also like appts mailed.   Venofer weekly x5 Labs in 3 months  MD/ venofer 1-2 days AFTER labs

## 2022-08-31 ENCOUNTER — Telehealth: Payer: Self-pay | Admitting: Internal Medicine

## 2022-08-31 DIAGNOSIS — K219 Gastro-esophageal reflux disease without esophagitis: Secondary | ICD-10-CM | POA: Diagnosis not present

## 2022-08-31 DIAGNOSIS — E538 Deficiency of other specified B group vitamins: Secondary | ICD-10-CM | POA: Diagnosis not present

## 2022-08-31 DIAGNOSIS — Z7982 Long term (current) use of aspirin: Secondary | ICD-10-CM | POA: Diagnosis not present

## 2022-08-31 DIAGNOSIS — M5090 Cervical disc disorder, unspecified, unspecified cervical region: Secondary | ICD-10-CM | POA: Diagnosis not present

## 2022-08-31 DIAGNOSIS — I6529 Occlusion and stenosis of unspecified carotid artery: Secondary | ICD-10-CM | POA: Diagnosis not present

## 2022-08-31 DIAGNOSIS — E1159 Type 2 diabetes mellitus with other circulatory complications: Secondary | ICD-10-CM | POA: Diagnosis not present

## 2022-08-31 DIAGNOSIS — E1142 Type 2 diabetes mellitus with diabetic polyneuropathy: Secondary | ICD-10-CM | POA: Diagnosis not present

## 2022-08-31 DIAGNOSIS — J432 Centrilobular emphysema: Secondary | ICD-10-CM | POA: Diagnosis not present

## 2022-08-31 DIAGNOSIS — I700 Atherosclerosis of aorta: Secondary | ICD-10-CM | POA: Diagnosis not present

## 2022-08-31 DIAGNOSIS — F17210 Nicotine dependence, cigarettes, uncomplicated: Secondary | ICD-10-CM | POA: Diagnosis not present

## 2022-08-31 DIAGNOSIS — I152 Hypertension secondary to endocrine disorders: Secondary | ICD-10-CM | POA: Diagnosis not present

## 2022-08-31 DIAGNOSIS — E1169 Type 2 diabetes mellitus with other specified complication: Secondary | ICD-10-CM | POA: Diagnosis not present

## 2022-08-31 DIAGNOSIS — M069 Rheumatoid arthritis, unspecified: Secondary | ICD-10-CM | POA: Diagnosis not present

## 2022-08-31 DIAGNOSIS — I69354 Hemiplegia and hemiparesis following cerebral infarction affecting left non-dominant side: Secondary | ICD-10-CM | POA: Diagnosis not present

## 2022-08-31 DIAGNOSIS — R296 Repeated falls: Secondary | ICD-10-CM | POA: Diagnosis not present

## 2022-08-31 DIAGNOSIS — Z9181 History of falling: Secondary | ICD-10-CM | POA: Diagnosis not present

## 2022-08-31 DIAGNOSIS — M8580 Other specified disorders of bone density and structure, unspecified site: Secondary | ICD-10-CM | POA: Diagnosis not present

## 2022-08-31 DIAGNOSIS — G4700 Insomnia, unspecified: Secondary | ICD-10-CM | POA: Diagnosis not present

## 2022-08-31 DIAGNOSIS — M1990 Unspecified osteoarthritis, unspecified site: Secondary | ICD-10-CM | POA: Diagnosis not present

## 2022-08-31 DIAGNOSIS — R634 Abnormal weight loss: Secondary | ICD-10-CM | POA: Diagnosis not present

## 2022-08-31 DIAGNOSIS — J4489 Other specified chronic obstructive pulmonary disease: Secondary | ICD-10-CM | POA: Diagnosis not present

## 2022-08-31 DIAGNOSIS — C679 Malignant neoplasm of bladder, unspecified: Secondary | ICD-10-CM | POA: Diagnosis not present

## 2022-08-31 DIAGNOSIS — D630 Anemia in neoplastic disease: Secondary | ICD-10-CM | POA: Diagnosis not present

## 2022-08-31 DIAGNOSIS — E785 Hyperlipidemia, unspecified: Secondary | ICD-10-CM | POA: Diagnosis not present

## 2022-08-31 LAB — CULTURE, URINE COMPREHENSIVE

## 2022-08-31 NOTE — Telephone Encounter (Signed)
Medication Refill - Medication: glucose blood (ACCU-CHEK GUIDE) test strip [[id number]]    Has the patient contacted their pharmacy? Yes.   (Agent: If no, request that the patient contact the pharmacy for the refill. If patient does not wish to contact the pharmacy document the reason why and proceed with request.) (Agent: If yes, when and what did the pharmacy advise?)call pcp   Preferred Pharmacy (with phone number or street name): [name]  Has the patient been seen for an appointment in the last year OR does the patient have an upcoming appointment? Yes.    Agent: Please be advised that RX refills may take up to 3 business days. We ask that you follow-up with your pharmacy.

## 2022-08-31 NOTE — Telephone Encounter (Signed)
Requested medication (s) are due for refill today: yes   Requested medication (s) are on the active medication list: yes   Last refill:  05/09/22 #400 0 refills  Future visit scheduled: no   Notes to clinic:   do you want to refill Rx?      Requested Prescriptions  Pending Prescriptions Disp Refills   glucose blood (ACCU-CHEK GUIDE) test strip 400 each 0    Sig: Use as directed up to 4 times daily. DX: E11.9     Endocrinology: Diabetes - Testing Supplies Passed - 08/31/2022  3:50 PM      Passed - Valid encounter within last 12 months    Recent Outpatient Visits           10 months ago Nonspecific chest pain   [facility], Coralie Keens, NP   10 months ago Chest pain, unspecified type   [facility], Coralie Keens, NP   1 year ago Controlled type 2 diabetes mellitus with complication, without long-term current use of insulin [name])   [facility], Coralie Keens, NP   1 year ago Chronic right shoulder pain   [facility], Coralie Keens, NP   1 year ago Aortic atherosclerosis [facility])   [facility] Coralie Keens, NP

## 2022-09-01 ENCOUNTER — Encounter: Payer: Self-pay | Admitting: Urology

## 2022-09-01 LAB — 5 HIAA, QUANTITATIVE, URINE, 24 HOUR
5-HIAA, Ur: 7.6 mg/L
5-HIAA,Quant.,24 Hr Urine: 3 mg/24 hr (ref 0.0–14.9)
Total Volume: 400

## 2022-09-01 LAB — IFE+PROTEIN ELECTRO, 24-HR UR
% BETA, Urine: 27.4 %
ALPHA 1 URINE: 3 %
Albumin, U: 43.1 %
Alpha 2, Urine: 7.9 %
GAMMA GLOBULIN URINE: 18.7 %
M-SPIKE %, Urine: 7 % — ABNORMAL HIGH
M-Spike, Mg/24 Hr: 10 mg/24 hr — ABNORMAL HIGH
Total Protein, Urine-Ur/day: 141 mg/24 hr (ref 30–150)
Total Protein, Urine: 35.3 mg/dL
Total Volume: 400

## 2022-09-04 ENCOUNTER — Ambulatory Visit (INDEPENDENT_AMBULATORY_CARE_PROVIDER_SITE_OTHER): Payer: Medicare Other | Admitting: Physician Assistant

## 2022-09-04 DIAGNOSIS — R310 Gross hematuria: Secondary | ICD-10-CM

## 2022-09-04 DIAGNOSIS — C678 Malignant neoplasm of overlapping sites of bladder: Secondary | ICD-10-CM

## 2022-09-04 LAB — URINALYSIS, COMPLETE
Bilirubin, UA: NEGATIVE
Glucose, UA: NEGATIVE
Ketones, UA: NEGATIVE
Leukocytes,UA: NEGATIVE
Nitrite, UA: NEGATIVE
Protein,UA: NEGATIVE
RBC, UA: NEGATIVE
Specific Gravity, UA: 1.03 (ref 1.005–1.030)
Urobilinogen, Ur: 0.2 mg/dL (ref 0.2–1.0)
pH, UA: 6 (ref 5.0–7.5)

## 2022-09-04 LAB — MICROSCOPIC EXAMINATION: Epithelial Cells (non renal): >10 /hpf — AB (ref 0–10)

## 2022-09-04 MED ORDER — BCG LIVE 50 MG IS SUSR
3.2400 mL | Freq: Once | INTRAVESICAL | Status: AC
  Administered 2022-09-04: 81 mg via INTRAVESICAL

## 2022-09-04 NOTE — Progress Notes (Signed)
BCG Bladder Instillation  BCG # 1 of 6  Due to Bladder Cancer patient is present today for a BCG treatment. Patient was cleaned and prepped in a sterile fashion with betadine. A 14FR catheter was inserted, urine return was noted 142m, urine was yellow in color.  560mof reconstituted BCG was instilled into the bladder. The catheter was then removed. Patient tolerated well, no complications were noted  Performed by: SaDebroah LoopPA-C and CaElberta LeatherwoodCMA  Additional notes: After patient no-showed her first 4 schedule BCG visits and did not contact our office to reschedule these, her medicine was allocated to another patient. BCG is now on backorder. We have 2 available doses in clinic, which have been allocated to Kendra [name]. Will treat her today and next week and call her when another 4 doses are available. We discussed that we anticipate BCG shipments to resume in January, but the timing of this is unclear, as is the amount of BCG we will receive.  Patient remained in clinic for 30 minutes following instillation today for monitoring of hypersensitivity reaction; none noted.  We reviewed post-instillation instructions including holding the urine for 2 hours with quarter turns every 15 minutes and pouring bleach into the toilet with subsequent voids for 6 hours. Written instructions also provided today. She expressed understanding.   Follow up/ Additional notes: 1 week for BCG #2 of 6

## 2022-09-04 NOTE — Patient Instructions (Signed)
Your Timeline for Today:  Right now through 1:35pm: Hold your urine and do your quarter turns every 15 minutes. 1:35pm-7:35pm today: Every time you urinate, pour 1/2 cup of bleach into the toilet and let it sit for 15 minutes prior to flushing. 7:35pm onward: Resume your normal routine.   After every 2-3 treatments, use a Rid-X treatment at home to replace the enzymes in your septic tank. You can get these at the hardware store.  Patient Education: (BCG) Into the Bladder (Intravesical Chemotherapy)  BCG is a vaccine which is used to prevent tuberculosis (TB).  But it's also a helpful treatment for some early bladder cancers.  When BCG goes directly into the bladder the treatment is described as intravesical.  BCG is a type of immunotherapy.  Immunotherapy stimulates the body's immune system to destroy cancer cells.  How it's given BCG treatment is given to you in an outpatient setting.  It takes a few minutes to administer and you can go home as soon as it's finished.  It might be a good idea to ask someone to bring you, particularly the fist time.  Unlike chemotherapy into the bladder, BCG treatment is never given immediately after surgery to remove bladder tumors.  There needs to be a delay usually of at least two weeks after surgery, before you can have it.  You won't be given treatment with BCG if you are unwell or have an infection in your urine.  You're usually asked to limit the amount you drink before your treatment.  This will help to increase the concentration of BCG in your bladder.  Drinking too much before your treatment may make your bladder feel uncomfortably full.  If you normally take water tablets (diuretics) take them later in the day after your treatment.  Your nurse or doctor will give you more advise about preparing for your treatment.  You will have a small tube (catheter) placed into your bladder.  Your doctor will then put the liquid vaccine directly into your bladder  through the catheter and remove the catheter.  You will need to hold your urine for two hours afterwards.  Rotating every 15 minutes from side to side. This can be difficult but it's to give the treatment time to work.  When the treatment is over you can go to the toilet.  After your treatment there are some precautions you'll need to take.  This is because BCG is a live vaccine and other people shouldn't be exposed to it.  For the next six hours, you'll need to avoid your urine splashing on the toilet seat and getting any urine on your hands.  It might be easer for men to sit down when they're using an ordinary toilet although using a stand up urinal should be alright.  The main this is to avoid splashing urine and spreading the vaccine.  You will also be asked to put 1/2 cup undiluted bleach into the toilet to destroy any live vaccine and leave it for 15 minutes until you flush for the next 6 voids.  Side Effects Because BCG goes directly into the bladder most of the side effects are linked with the bladder.  They usually go away within one to two days after your treatment.  The most common ones are: -needing to pass urine often -pain when you pass urine -blood in urine -flu-like symptoms (tiredness, general aching and raised temperature)  Theses side effects should settle down within a day or two.  If they  don't get better contact your doctor.  Drinking lots of fluids can help flush the drug out of your bladder and reduce some of these effects.  Taking Ibuprofen or Aleve is encouraged unless you have a condition that would make these medications unsafe to take (renal failure, diabetes, gerd)  Rare side effects can include a continuing high temperature (fever), pain in your joints and a cough.  If you have any of these symptoms, or if you feel generally unwell, contact your doctor.  These symptoms could be a sign of a more serious infection (due to BCG) that needs to be treated immediately.  If  this happens you'll be treated with the same drugs (antibiotics) that are used to treat TB.  Contraception Men should use a condom during sex for the first 48 hours after their treatment.  If you are a women who has had BCG treatment then your partner should use a condom.  Using a condom will protect your partner from any vaccine present in your semen or vaginal fluid.  We don't know how BCG may affect a developing fetus so it's not advisable to become pregnant or father a child while having it.  It is important to use effective contraception during your treatment and for six weeks afterwards.  You can discuss this with your doctor or specialist nurse.

## 2022-09-08 DIAGNOSIS — E1142 Type 2 diabetes mellitus with diabetic polyneuropathy: Secondary | ICD-10-CM | POA: Diagnosis not present

## 2022-09-08 DIAGNOSIS — R634 Abnormal weight loss: Secondary | ICD-10-CM | POA: Diagnosis not present

## 2022-09-08 DIAGNOSIS — M5090 Cervical disc disorder, unspecified, unspecified cervical region: Secondary | ICD-10-CM | POA: Diagnosis not present

## 2022-09-08 DIAGNOSIS — E785 Hyperlipidemia, unspecified: Secondary | ICD-10-CM | POA: Diagnosis not present

## 2022-09-08 DIAGNOSIS — G4700 Insomnia, unspecified: Secondary | ICD-10-CM | POA: Diagnosis not present

## 2022-09-08 DIAGNOSIS — K219 Gastro-esophageal reflux disease without esophagitis: Secondary | ICD-10-CM | POA: Diagnosis not present

## 2022-09-08 DIAGNOSIS — E538 Deficiency of other specified B group vitamins: Secondary | ICD-10-CM | POA: Diagnosis not present

## 2022-09-08 DIAGNOSIS — I69354 Hemiplegia and hemiparesis following cerebral infarction affecting left non-dominant side: Secondary | ICD-10-CM | POA: Diagnosis not present

## 2022-09-08 DIAGNOSIS — M8580 Other specified disorders of bone density and structure, unspecified site: Secondary | ICD-10-CM | POA: Diagnosis not present

## 2022-09-08 DIAGNOSIS — I6529 Occlusion and stenosis of unspecified carotid artery: Secondary | ICD-10-CM | POA: Diagnosis not present

## 2022-09-08 DIAGNOSIS — Z7982 Long term (current) use of aspirin: Secondary | ICD-10-CM | POA: Diagnosis not present

## 2022-09-08 DIAGNOSIS — I152 Hypertension secondary to endocrine disorders: Secondary | ICD-10-CM | POA: Diagnosis not present

## 2022-09-08 DIAGNOSIS — D630 Anemia in neoplastic disease: Secondary | ICD-10-CM | POA: Diagnosis not present

## 2022-09-08 DIAGNOSIS — J432 Centrilobular emphysema: Secondary | ICD-10-CM | POA: Diagnosis not present

## 2022-09-08 DIAGNOSIS — E1169 Type 2 diabetes mellitus with other specified complication: Secondary | ICD-10-CM | POA: Diagnosis not present

## 2022-09-08 DIAGNOSIS — E1159 Type 2 diabetes mellitus with other circulatory complications: Secondary | ICD-10-CM | POA: Diagnosis not present

## 2022-09-08 DIAGNOSIS — J4489 Other specified chronic obstructive pulmonary disease: Secondary | ICD-10-CM | POA: Diagnosis not present

## 2022-09-08 DIAGNOSIS — R296 Repeated falls: Secondary | ICD-10-CM | POA: Diagnosis not present

## 2022-09-08 DIAGNOSIS — Z9181 History of falling: Secondary | ICD-10-CM | POA: Diagnosis not present

## 2022-09-08 DIAGNOSIS — M1990 Unspecified osteoarthritis, unspecified site: Secondary | ICD-10-CM | POA: Diagnosis not present

## 2022-09-08 DIAGNOSIS — M069 Rheumatoid arthritis, unspecified: Secondary | ICD-10-CM | POA: Diagnosis not present

## 2022-09-08 DIAGNOSIS — F17210 Nicotine dependence, cigarettes, uncomplicated: Secondary | ICD-10-CM | POA: Diagnosis not present

## 2022-09-08 DIAGNOSIS — C679 Malignant neoplasm of bladder, unspecified: Secondary | ICD-10-CM | POA: Diagnosis not present

## 2022-09-08 DIAGNOSIS — I700 Atherosclerosis of aorta: Secondary | ICD-10-CM | POA: Diagnosis not present

## 2022-09-12 ENCOUNTER — Ambulatory Visit: Payer: Medicare Other | Admitting: Physician Assistant

## 2022-09-13 ENCOUNTER — Ambulatory Visit: Payer: Medicare Other | Admitting: Physician Assistant

## 2022-09-13 MED FILL — Iron Sucrose Inj 20 MG/ML (Fe Equiv): INTRAVENOUS | Qty: 10 | Status: AC

## 2022-09-14 ENCOUNTER — Inpatient Hospital Stay: Payer: Medicare Other

## 2022-09-14 DIAGNOSIS — D630 Anemia in neoplastic disease: Secondary | ICD-10-CM | POA: Diagnosis not present

## 2022-09-14 DIAGNOSIS — C679 Malignant neoplasm of bladder, unspecified: Secondary | ICD-10-CM | POA: Diagnosis not present

## 2022-09-14 DIAGNOSIS — E1159 Type 2 diabetes mellitus with other circulatory complications: Secondary | ICD-10-CM | POA: Diagnosis not present

## 2022-09-14 DIAGNOSIS — J432 Centrilobular emphysema: Secondary | ICD-10-CM | POA: Diagnosis not present

## 2022-09-14 DIAGNOSIS — Z7982 Long term (current) use of aspirin: Secondary | ICD-10-CM | POA: Diagnosis not present

## 2022-09-14 DIAGNOSIS — R296 Repeated falls: Secondary | ICD-10-CM | POA: Diagnosis not present

## 2022-09-14 DIAGNOSIS — E538 Deficiency of other specified B group vitamins: Secondary | ICD-10-CM | POA: Diagnosis not present

## 2022-09-14 DIAGNOSIS — I700 Atherosclerosis of aorta: Secondary | ICD-10-CM | POA: Diagnosis not present

## 2022-09-14 DIAGNOSIS — R634 Abnormal weight loss: Secondary | ICD-10-CM | POA: Diagnosis not present

## 2022-09-14 DIAGNOSIS — E785 Hyperlipidemia, unspecified: Secondary | ICD-10-CM | POA: Diagnosis not present

## 2022-09-14 DIAGNOSIS — M069 Rheumatoid arthritis, unspecified: Secondary | ICD-10-CM | POA: Diagnosis not present

## 2022-09-14 DIAGNOSIS — M5090 Cervical disc disorder, unspecified, unspecified cervical region: Secondary | ICD-10-CM | POA: Diagnosis not present

## 2022-09-14 DIAGNOSIS — I152 Hypertension secondary to endocrine disorders: Secondary | ICD-10-CM | POA: Diagnosis not present

## 2022-09-14 DIAGNOSIS — K219 Gastro-esophageal reflux disease without esophagitis: Secondary | ICD-10-CM | POA: Diagnosis not present

## 2022-09-14 DIAGNOSIS — E1142 Type 2 diabetes mellitus with diabetic polyneuropathy: Secondary | ICD-10-CM | POA: Diagnosis not present

## 2022-09-14 DIAGNOSIS — I6529 Occlusion and stenosis of unspecified carotid artery: Secondary | ICD-10-CM | POA: Diagnosis not present

## 2022-09-14 DIAGNOSIS — M8580 Other specified disorders of bone density and structure, unspecified site: Secondary | ICD-10-CM | POA: Diagnosis not present

## 2022-09-14 DIAGNOSIS — J4489 Other specified chronic obstructive pulmonary disease: Secondary | ICD-10-CM | POA: Diagnosis not present

## 2022-09-14 DIAGNOSIS — I69354 Hemiplegia and hemiparesis following cerebral infarction affecting left non-dominant side: Secondary | ICD-10-CM | POA: Diagnosis not present

## 2022-09-14 DIAGNOSIS — E1169 Type 2 diabetes mellitus with other specified complication: Secondary | ICD-10-CM | POA: Diagnosis not present

## 2022-09-14 DIAGNOSIS — G4700 Insomnia, unspecified: Secondary | ICD-10-CM | POA: Diagnosis not present

## 2022-09-14 DIAGNOSIS — F17210 Nicotine dependence, cigarettes, uncomplicated: Secondary | ICD-10-CM | POA: Diagnosis not present

## 2022-09-14 DIAGNOSIS — M1990 Unspecified osteoarthritis, unspecified site: Secondary | ICD-10-CM | POA: Diagnosis not present

## 2022-09-14 DIAGNOSIS — Z9181 History of falling: Secondary | ICD-10-CM | POA: Diagnosis not present

## 2022-09-15 ENCOUNTER — Ambulatory Visit (INDEPENDENT_AMBULATORY_CARE_PROVIDER_SITE_OTHER): Payer: Medicare Other | Admitting: Physician Assistant

## 2022-09-15 DIAGNOSIS — D494 Neoplasm of unspecified behavior of bladder: Secondary | ICD-10-CM

## 2022-09-15 DIAGNOSIS — C679 Malignant neoplasm of bladder, unspecified: Secondary | ICD-10-CM | POA: Diagnosis not present

## 2022-09-15 LAB — URINALYSIS, COMPLETE
Bilirubin, UA: NEGATIVE
Glucose, UA: NEGATIVE
Ketones, UA: NEGATIVE
Nitrite, UA: NEGATIVE
RBC, UA: NEGATIVE
Specific Gravity, UA: 1.025 (ref 1.005–1.030)
Urobilinogen, Ur: 0.2 mg/dL (ref 0.2–1.0)
pH, UA: 6.5 (ref 5.0–7.5)

## 2022-09-15 LAB — MICROSCOPIC EXAMINATION

## 2022-09-15 MED ORDER — BCG LIVE 50 MG IS SUSR
3.2400 mL | Freq: Once | INTRAVESICAL | Status: AC
  Administered 2022-09-15: 81 mg via INTRAVESICAL

## 2022-09-15 NOTE — Progress Notes (Signed)
BCG Bladder Instillation  BCG # 2 of 6  Due to Bladder Cancer patient is present today for a BCG treatment. Patient was cleaned and prepped in a sterile fashion with betadine. A 14FR catheter was inserted, urine return was noted 21m, urine was yellow in color.  560mof reconstituted BCG was instilled into the bladder. The catheter was then removed. Patient tolerated well, no complications were noted  Performed by: SaDebroah LoopPA-C and JeGaspar ColaCMA  Additional notes: We have received another shipment of BCG and have enough doses on hand to complete her induction course; appointments scheduled and I will ask CrJoyice Fastero coordinate taxi rides.  Follow up/ Additional notes: 1 week for BCG #3 of 6

## 2022-09-15 NOTE — Patient Instructions (Signed)
Your Timeline for Today:  Right now through 3:30pm: Hold your urine and do your quarter turns every 15 minutes. 3:30pm-9:30pm today: Every time you urinate, pour 1/2 cup of bleach into the toilet and let it sit for 15 minutes prior to flushing. 9:30pm onward: Resume your normal routine.

## 2022-09-18 ENCOUNTER — Encounter: Payer: Self-pay | Admitting: Oncology

## 2022-09-19 ENCOUNTER — Ambulatory Visit: Payer: Medicare Other | Admitting: Physician Assistant

## 2022-09-20 MED FILL — Iron Sucrose Inj 20 MG/ML (Fe Equiv): INTRAVENOUS | Qty: 10 | Status: AC

## 2022-09-21 ENCOUNTER — Inpatient Hospital Stay: Payer: Medicare HMO | Attending: Oncology

## 2022-09-22 ENCOUNTER — Encounter: Payer: Self-pay | Admitting: Oncology

## 2022-09-22 ENCOUNTER — Other Ambulatory Visit: Payer: Self-pay | Admitting: Internal Medicine

## 2022-09-22 ENCOUNTER — Ambulatory Visit (INDEPENDENT_AMBULATORY_CARE_PROVIDER_SITE_OTHER): Payer: Medicare HMO | Admitting: Physician Assistant

## 2022-09-22 DIAGNOSIS — C679 Malignant neoplasm of bladder, unspecified: Secondary | ICD-10-CM

## 2022-09-22 DIAGNOSIS — D494 Neoplasm of unspecified behavior of bladder: Secondary | ICD-10-CM

## 2022-09-22 DIAGNOSIS — E785 Hyperlipidemia, unspecified: Secondary | ICD-10-CM

## 2022-09-22 LAB — URINALYSIS, COMPLETE
Bilirubin, UA: NEGATIVE
Glucose, UA: NEGATIVE
Ketones, UA: NEGATIVE
Leukocytes,UA: NEGATIVE
Nitrite, UA: NEGATIVE
RBC, UA: NEGATIVE
Specific Gravity, UA: 1.03 (ref 1.005–1.030)
Urobilinogen, Ur: 0.2 mg/dL (ref 0.2–1.0)
pH, UA: 5 (ref 5.0–7.5)

## 2022-09-22 LAB — MICROSCOPIC EXAMINATION

## 2022-09-22 MED ORDER — BCG LIVE 50 MG IS SUSR
3.2400 mL | Freq: Once | INTRAVESICAL | Status: AC
  Administered 2022-09-22: 81 mg via INTRAVESICAL

## 2022-09-22 NOTE — Telephone Encounter (Signed)
Requested by interface surescripts. Patient not at pracitce.  Requested Prescriptions  Refused Prescriptions Disp Refills   buPROPion (WELLBUTRIN XL) 150 MG 24 hr tablet [Pharmacy Med Name: BUPROPION HCL ER (XL) 150 MG TAB] 30 tablet 0    Sig: TAKE 1 TABLET BY MOUTH ONCE DAILY ** NEED TO KEEP UPCOMING APPOINTMENT!!1     Psychiatry: Antidepressants - bupropion Failed - 09/22/2022  1:14 PM      Failed - Valid encounter within last 6 months    Recent Outpatient Visits           11 months ago Nonspecific chest pain   [facility] [place], Coralie Keens, NP   11 months ago Chest pain, unspecified type   [facility] [place], Coralie Keens, NP   1 year ago Controlled type 2 diabetes mellitus with complication, without long-term current use of insulin ([place])   [facility] [place], Coralie Keens, NP   1 year ago Chronic right shoulder pain   [facility] [place], Coralie Keens, NP   1 year ago Aortic atherosclerosis [facility])   [facility], Coralie Keens, NP       Future Appointments             In 1 week Debroah Loop, PA-C [place]   In 2 weeks Debroah Loop, [place]   In 3 weeks Debroah Loop, [place] [facility] [place]            Passed - Cr in normal range and within 360 days    Creat  Date Value Ref Range Status  08/01/2021 0.85 0.60 - 1.00 mg/dL Final   Creatinine, Ser  Date Value Ref Range Status  08/22/2022 0.77 0.44 - 1.00 mg/dL Final   Creatinine, Urine  Date Value Ref Range Status  05/31/2021 89 20 - 275 mg/dL Final         Passed - AST in normal range and within 360 days    AST  Date Value Ref Range Status  08/22/2022 20 15 - 41 U/L Final   SGOT(AST)  Date Value Ref Range Status  09/01/2014 20 15 - 37 Unit/L Final         Passed - ALT in normal range and within 360 days    ALT  Date Value Ref Range Status  08/22/2022 17 0  - 44 U/L Final   SGPT (ALT)  Date Value Ref Range Status  09/01/2014 24 U/L Final    Comment:    14-63 NOTE: New Reference Range 04/07/14          Passed - Completed PHQ-2 or PHQ-9 in the last 360 days      Passed - Last BP in normal range    BP Readings from Last 1 Encounters:  08/29/22 129/82          atorvastatin (LIPITOR) 40 MG tablet [Pharmacy Med Name: ATORVASTATIN CALCIUM 40 MG TAB] 30 tablet 0    Sig: TAKE 1 TABLET BY MOUTH BEDTIME     Cardiovascular:  Antilipid - Statins Failed - 09/22/2022  1:14 PM      Failed - Lipid Panel in normal range within the last 12 months    Cholesterol, Total  Date Value Ref Range Status  06/07/2015 177 100 - 199 mg/dL Final   Cholesterol  Date Value Ref Range Status  08/01/2021 135 <200 mg/dL Final  09/02/2014 141 0 - 200 mg/dL Final  Ldl Cholesterol, Calc  Date Value Ref Range Status  09/02/2014 73 0 - 100 mg/dL Final   LDL Cholesterol (Calc)  Date Value Ref Range Status  08/01/2021 53 mg/dL (calc) Final    Comment:    Reference range: <100 . Desirable range <100 mg/dL for primary prevention;   <70 mg/dL for patients with CHD or diabetic patients  with > or = 2 CHD risk factors. [name] LDL-C is now calculated using the Martin-Hopkins  calculation, which is a validated novel method providing  better accuracy than the Friedewald equation in the  estimation of LDL-C.  Cresenciano Genre et al. Annamaria Helling. 0569;794(80): 2061-2068  (http://education.[employer].com/faq/FAQ164)    HDL Cholesterol  Date Value Ref Range Status  09/02/2014 37 (L) 40 - 60 mg/dL Final   HDL  Date Value Ref Range Status  08/01/2021 65 > OR = 50 mg/dL Final  06/07/2015 56 >39 mg/dL Final    Comment:    According to ATP-III Guidelines, HDL-C >59 mg/dL is considered a negative risk factor for CHD.    Triglycerides  Date Value Ref Range Status  08/01/2021 89 <150 mg/dL Final  09/02/2014 155 0 - 200 mg/dL Final         Passed - Patient is not  pregnant      Passed - Valid encounter within last 12 months    Recent Outpatient Visits           11 months ago Nonspecific chest pain   [name] [place], [place], [place]   11 months ago Chest pain, unspecified type   [facility] [place], Coralie Keens, NP   1 year ago Controlled type 2 diabetes mellitus with complication, without long-term current use of insulin [facility])   [place], Coralie Keens, NP   1 year ago Chronic right shoulder pain   [facility] [place], Coralie Keens, NP   1 year ago Aortic atherosclerosis [facility])   [facility], Coralie Keens, NP       Future Appointments             In 1 week Debroah Loop, PA-C [place]   In 2 weeks Debroah Loop, [place]   In 3 weeks Debroah Loop, [place]

## 2022-09-22 NOTE — Telephone Encounter (Signed)
Called patient to schedule appt for medication refills. No answer from patient , recording call can not be completed at this time. Call again later. Unable to leave message .

## 2022-09-22 NOTE — Progress Notes (Signed)
BCG Bladder Instillation  BCG # 3 of 6  Due to Bladder Cancer patient is present today for a BCG treatment. Patient was cleaned and prepped in a sterile fashion with betadine. A 14FR catheter was inserted, urine return was noted 58m, urine was yellow in color.  527mof reconstituted BCG was instilled into the bladder. The catheter was then removed. Patient tolerated well, no complications were noted  Performed by: SaDebroah LoopPA-C and AnTiffany KocherCMA  Follow up/ Additional notes: 1 week for BCG #4 of 6

## 2022-09-22 NOTE — Telephone Encounter (Signed)
Requested medication (s) are due for refill today: yes   Requested medication (s) are on the active medication list: yes   Last refill:  08/28/22 #30 0 refills  Future visit scheduled: no   Notes to clinic:  no valid encounter attempted to contact patient to scheduled appt for medication refills. No answer unable to leave VM. Do you want to give another courtesy refill?     Requested Prescriptions  Pending Prescriptions Disp Refills   buPROPion (WELLBUTRIN XL) 150 MG 24 hr tablet [Pharmacy Med Name: BUPROPION HCL ER (XL) 150 MG TAB] 30 tablet 0    Sig: TAKE 1 TABLET BY MOUTH ONCE DAILY ** NEED TO KEEP UPCOMING APPOINTMENT!!1     Psychiatry: Antidepressants - bupropion Failed - 09/22/2022  1:14 PM      Failed - Valid encounter within last 6 months    Recent Outpatient Visits           11 months ago Nonspecific chest pain   [facility] [place], Coralie Keens, NP   11 months ago Chest pain, unspecified type   [facility] [place], Coralie Keens, NP   1 year ago Controlled type 2 diabetes mellitus with complication, without long-term current use of insulin ([place])   [facility] [place], Coralie Keens, NP   1 year ago Chronic right shoulder pain   [name] [place], [place] W, NP   1 year ago Aortic atherosclerosis [facility])   [name], Coralie Keens, NP       Future Appointments             In 1 week Debroah Loop, PA-C [place]   In 2 weeks Debroah Loop, [place]   In 3 weeks Debroah Loop, [place] [facility] [place]            Passed - Cr in normal range and within 360 days    Creat  Date Value Ref Range Status  08/01/2021 0.85 0.60 - 1.00 mg/dL Final   Creatinine, Ser  Date Value Ref Range Status  08/22/2022 0.77 0.44 - 1.00 mg/dL Final   Creatinine, Urine  Date Value Ref Range Status  05/31/2021 89 20 - 275 mg/dL Final          Passed - AST in normal range and within 360 days    AST  Date Value Ref Range Status  08/22/2022 20 15 - 41 U/L Final   SGOT(AST)  Date Value Ref Range Status  09/01/2014 20 15 - 37 Unit/L Final         Passed - ALT in normal range and within 360 days    ALT  Date Value Ref Range Status  08/22/2022 17 0 - 44 U/L Final   SGPT (ALT)  Date Value Ref Range Status  09/01/2014 24 U/L Final    Comment:    14-63 NOTE: New Reference Range 04/07/14          Passed - Completed PHQ-2 or PHQ-9 in the last 360 days      Passed - Last BP in normal range    BP Readings from Last 1 Encounters:  08/29/22 129/82          atorvastatin (LIPITOR) 40 MG tablet [Pharmacy Med Name: ATORVASTATIN CALCIUM 40 MG TAB] 30 tablet 0    Sig: TAKE 1 TABLET BY MOUTH BEDTIME     Cardiovascular:  Antilipid - Statins Failed -  09/22/2022  1:14 PM      Failed - Lipid Panel in normal range within the last 12 months    Cholesterol, Total  Date Value Ref Range Status  06/07/2015 177 100 - 199 mg/dL Final   Cholesterol  Date Value Ref Range Status  08/01/2021 135 <200 mg/dL Final  09/02/2014 141 0 - 200 mg/dL Final   Ldl Cholesterol, Calc  Date Value Ref Range Status  09/02/2014 73 0 - 100 mg/dL Final   LDL Cholesterol (Calc)  Date Value Ref Range Status  08/01/2021 53 mg/dL (calc) Final    Comment:    Reference range: <100 . Desirable range <100 mg/dL for primary prevention;   <70 mg/dL for patients with CHD or diabetic patients  with > or = 2 CHD risk factors. [name] LDL-C is now calculated using the Martin-Hopkins  calculation, which is a validated novel method providing  better accuracy than the Friedewald equation in the  estimation of LDL-C.  Cresenciano Genre et al. Annamaria Helling. 3428;768(11): 2061-2068  (http://education.[facility].com/faq/FAQ164)    HDL Cholesterol  Date Value Ref Range Status  09/02/2014 37 (L) 40 - 60 mg/dL Final   HDL  Date Value Ref Range Status  08/01/2021 65 > OR  = 50 mg/dL Final  06/07/2015 56 >39 mg/dL Final    Comment:    According to ATP-III Guidelines, HDL-C >59 mg/dL is considered a negative risk factor for CHD.    Triglycerides  Date Value Ref Range Status  08/01/2021 89 <150 mg/dL Final  09/02/2014 155 0 - 200 mg/dL Final         Passed - Patient is not pregnant      Passed - Valid encounter within last 12 months    Recent Outpatient Visits           11 months ago Nonspecific chest pain   [facility] [place], [place], NP   11 months ago Chest pain, unspecified type   [facility] [place], Coralie Keens, NP   1 year ago Controlled type 2 diabetes mellitus with complication, without long-term current use of insulin [name])   [facility], Coralie Keens, NP   1 year ago Chronic right shoulder pain   [facility] [place], Coralie Keens, NP   1 year ago Aortic atherosclerosis [facility])   [facility], Coralie Keens, NP       Future Appointments             In 1 week Debroah Loop, PA-C [place]   In 2 weeks Debroah Loop, [place]   In 3 weeks Debroah Loop, [place]            s

## 2022-09-22 NOTE — Patient Instructions (Signed)
Your Timeline for Today:  Right now through 3:30pm: Hold your urine and do your quarter turns every 15 minutes. 3:30pm-9:30pm today: Every time you urinate, pour 1/2 cup of bleach into the toilet and let it sit for 15 minutes prior to flushing. 9:30pm onward: Resume your normal routine.

## 2022-09-25 ENCOUNTER — Ambulatory Visit: Payer: Self-pay | Admitting: *Deleted

## 2022-09-25 ENCOUNTER — Other Ambulatory Visit: Payer: Self-pay | Admitting: Internal Medicine

## 2022-09-25 DIAGNOSIS — E785 Hyperlipidemia, unspecified: Secondary | ICD-10-CM

## 2022-09-25 NOTE — Telephone Encounter (Signed)
Please advise. Pharmacy staff Leafy Ro from [facility] calling to request why medications are being denied or refused?  Pharmacy staff requesting refills for wellbutrin and atorvastatin.  Being refused due to not a patient at practice but patient has been seen , no shows notedb but does have refills previously. Please advise.    Reason for Disposition  [1] Pharmacy calling with prescription question AND [2] triager unable to answer question  Answer Assessment - Initial Assessment Questions 1. NAME of MEDICINE: "What medicine(s) are you calling about?"     Wellbutrin , atorvastatin 2. QUESTION: "What is your question?" (e.g., double dose of medicine, side effect)     [facility] calling to request why medications are being denied or refused? 3. PRESCRIBER: "Who prescribed the medicine?" Reason: if prescribed by specialist, call should be referred to that group.     Avie Echevaria, NP 4. SYMPTOMS: "Do you have any symptoms?" If Yes, ask: "What symptoms are you having?"  "How bad are the symptoms (e.g., mild, moderate, severe)     Needs refills on medication 5. PREGNANCY:  "Is there any chance that you are pregnant?" "When was your last menstrual period?"     na  Protocols used: Medication Question Call-A-AH

## 2022-09-26 NOTE — Telephone Encounter (Signed)
Her refills were approved earlier today

## 2022-09-26 NOTE — Telephone Encounter (Signed)
Pt called to see if pt still planning to see Rollene Fare, NP as PCP, unable to complete call, tried [name] son both numbers and 1 is wrong # and other # not available. # for [name] isn't set up for VM.

## 2022-09-26 NOTE — Telephone Encounter (Signed)
Requested medication (s) are due for refill today: yes  Requested medication (s) are on the active medication list: yes  Last refill:  08/28/22 #30/0  Future visit scheduled: no  Notes to clinic:  pt is still currently pt of [place], NP, tried to contact pt and sons but unable to reach any. Spoke with FC who advised to send back and have Rollene Fare review for refills.      Requested Prescriptions  Pending Prescriptions Disp Refills   atorvastatin (LIPITOR) 40 MG tablet [Pharmacy Med Name: ATORVASTATIN CALCIUM 40 MG TAB] 30 tablet 0    Sig: TAKE 1 TABLET BY MOUTH BEDTIME     Cardiovascular:  Antilipid - Statins Failed - 09/25/2022  3:30 PM      Failed - Lipid Panel in normal range within the last 12 months    Cholesterol, Total  Date Value Ref Range Status  06/07/2015 177 100 - 199 mg/dL Final   Cholesterol  Date Value Ref Range Status  08/01/2021 135 <200 mg/dL Final  09/02/2014 141 0 - 200 mg/dL Final   Ldl Cholesterol, Calc  Date Value Ref Range Status  09/02/2014 73 0 - 100 mg/dL Final   LDL Cholesterol (Calc)  Date Value Ref Range Status  08/01/2021 53 mg/dL (calc) Final    Comment:    Reference range: <100 . Desirable range <100 mg/dL for primary prevention;   <70 mg/dL for patients with CHD or diabetic patients  with > or = 2 CHD risk factors. [name] LDL-C is now calculated using the Martin-Hopkins  calculation, which is a validated novel method providing  better accuracy than the Friedewald equation in the  estimation of LDL-C.  Cresenciano Genre et al. Annamaria Helling. 2703;500(93): 2061-2068  (http://education.[facility].com/faq/FAQ164)    HDL Cholesterol  Date Value Ref Range Status  09/02/2014 37 (L) 40 - 60 mg/dL Final   HDL  Date Value Ref Range Status  08/01/2021 65 > OR = 50 mg/dL Final  06/07/2015 56 >39 mg/dL Final    Comment:    According to ATP-III Guidelines, HDL-C >59 mg/dL is considered a negative risk factor for CHD.    Triglycerides  Date Value Ref  Range Status  08/01/2021 89 <150 mg/dL Final  09/02/2014 155 0 - 200 mg/dL Final         Passed - Patient is not pregnant      Passed - Valid encounter within last 12 months    Recent Outpatient Visits           11 months ago Nonspecific chest pain   [facility] [place], [place], NP   11 months ago Chest pain, unspecified type   [facility] [place], Coralie Keens, NP   1 year ago Controlled type 2 diabetes mellitus with complication, without long-term current use of insulin [facility])   [facility], Coralie Keens, NP   1 year ago Chronic right shoulder pain   [facility] [place], Coralie Keens, NP   1 year ago Aortic atherosclerosis [name])   [facility], Coralie Keens, NP       Future Appointments             In 3 days Debroah Loop, [place]   In 1 week Debroah Loop, [place]   In 2 weeks Debroah Loop, [place] [facility] [place]             buPROPion [facility]  XL) 150 MG 24 hr tablet [Pharmacy Med Name: BUPROPION HCL ER (XL) 150 MG TAB] 30 tablet 0    Sig: TAKE 1 TABLET BY MOUTH ONCE DAILY ** NEED TO KEEP UPCOMING APPOINTMENT!!1     Psychiatry: Antidepressants - bupropion Failed - 09/25/2022  3:30 PM      Failed - Valid encounter within last 6 months    Recent Outpatient Visits           11 months ago Nonspecific chest pain   [facility] [place], Coralie Keens, NP   11 months ago Chest pain, unspecified type   [facility] [place], Coralie Keens, NP   1 year ago Controlled type 2 diabetes mellitus with complication, without long-term current use of insulin ([place])   [facility], Coralie Keens, NP   1 year ago Chronic right shoulder pain   [facility] [place], [place] W, NP   1 year ago Aortic atherosclerosis [facility])   [facility], Coralie Keens, NP        Future Appointments             In 3 days Debroah Loop, [place]   In 1 week Debroah Loop, [place]   In 2 weeks Debroah Loop, [place] [facility] [place]            Passed - Cr in normal range and within 360 days    Creat  Date Value Ref Range Status  08/01/2021 0.85 0.60 - 1.00 mg/dL Final   Creatinine, Ser  Date Value Ref Range Status  08/22/2022 0.77 0.44 - 1.00 mg/dL Final   Creatinine, Urine  Date Value Ref Range Status  05/31/2021 89 20 - 275 mg/dL Final         Passed - AST in normal range and within 360 days    AST  Date Value Ref Range Status  08/22/2022 20 15 - 41 U/L Final   SGOT(AST)  Date Value Ref Range Status  09/01/2014 20 15 - 37 Unit/L Final         Passed - ALT in normal range and within 360 days    ALT  Date Value Ref Range Status  08/22/2022 17 0 - 44 U/L Final   SGPT (ALT)  Date Value Ref Range Status  09/01/2014 24 U/L Final    Comment:    14-63 NOTE: New Reference Range 04/07/14          Passed - Completed PHQ-2 or PHQ-9 in the last 360 days      Passed - Last BP in normal range    BP Readings from Last 1 Encounters:  08/29/22 129/82

## 2022-09-27 MED FILL — Iron Sucrose Inj 20 MG/ML (Fe Equiv): INTRAVENOUS | Qty: 10 | Status: AC

## 2022-09-28 ENCOUNTER — Inpatient Hospital Stay: Payer: Medicare HMO

## 2022-09-29 ENCOUNTER — Ambulatory Visit (INDEPENDENT_AMBULATORY_CARE_PROVIDER_SITE_OTHER): Payer: Medicare HMO | Admitting: Physician Assistant

## 2022-09-29 DIAGNOSIS — N3000 Acute cystitis without hematuria: Secondary | ICD-10-CM

## 2022-09-29 LAB — URINALYSIS, COMPLETE
Bilirubin, UA: NEGATIVE
Glucose, UA: NEGATIVE
Ketones, UA: NEGATIVE
Nitrite, UA: POSITIVE — AB
RBC, UA: NEGATIVE
Specific Gravity, UA: 1.03 (ref 1.005–1.030)
Urobilinogen, Ur: 0.2 mg/dL (ref 0.2–1.0)
pH, UA: 5 (ref 5.0–7.5)

## 2022-09-29 LAB — MICROSCOPIC EXAMINATION: WBC, UA: >30 /hpf — AB (ref 0–5)

## 2022-09-29 MED ORDER — NITROFURANTOIN MONOHYD MACRO 100 MG PO CAPS: 100.0000 mg | ORAL_CAPSULE | Freq: Two times a day (BID) | ORAL | 0 refills | Status: AC

## 2022-09-29 NOTE — Progress Notes (Signed)
Patient presented to the clinic today for a scheduled BCG instillation.  Urine grossly infected on UA, culture pending.  Patient reports no sx.  Will treat for acute UTI today.  Prescription for Macrobid '100mg'$  BID x5 days sent to pharmacy.  Patient to return to clinic next week for next scheduled BCG treatment.  We will add an additional treatment to her scheduled series to make up for today's missed instillation.  Debroah Loop, PA-C 09/29/22 1:17 PM

## 2022-10-02 ENCOUNTER — Telehealth: Payer: Self-pay | Admitting: Physician Assistant

## 2022-10-02 NOTE — Telephone Encounter (Signed)
Pt LMOM asking about RX that Sam wanted her to start.  Shed didn't know the name of meds.

## 2022-10-02 NOTE — Telephone Encounter (Signed)
Medication is macrobid and this was sent to the pharmacy 09/29/22  .left message to have patient return my call.

## 2022-10-03 ENCOUNTER — Ambulatory Visit: Payer: Self-pay | Admitting: *Deleted

## 2022-10-03 LAB — CULTURE, URINE COMPREHENSIVE

## 2022-10-03 NOTE — Telephone Encounter (Signed)
Reason for Disposition  [1] MODERATE pain (e.g., interferes with normal activities, limping) AND [2] present > 3 days  Answer Assessment - Initial Assessment Questions 1. ONSET: "When did the pain start?"      I'm having pain in both of my legs.   N  In my hips and both legs.   I can't lay on my right or left hip.     2. LOCATION: "Where is the pain located?"      I'm having pain in both hips and both legs the pain has gotten worse over the last 2 weeks. 3. PAIN: "How bad is the pain?"    (Scale 1-10; or mild, moderate, severe)   -  MILD (1-3): doesn't interfere with normal activities    -  MODERATE (4-7): interferes with normal activities (e.g., work or school) or awakens from sleep, limping    -  SEVERE (8-10): excruciating pain, unable to do any normal activities, unable to walk     Moderate    4. WORK OR EXERCISE: "Has there been any recent work or exercise that involved this part of the body?"      No accidents 5. CAUSE: "What do you think is causing the leg pain?"     I don't know 6. OTHER SYMPTOMS: "Do you have any other symptoms?" (e.g., chest pain, back pain, breathing difficulty, swelling, rash, fever, numbness, weakness)     No 7. PREGNANCY: "Is there any chance you are pregnant?" "When was your last menstrual period?"     N/A  Protocols used: Leg Pain-A-AH

## 2022-10-03 NOTE — Telephone Encounter (Signed)
  Chief Complaint: Pain in both hips and legs over the last 2 weeks.   Symptoms: Pain in legs and hips Frequency: Gotten worse over the last 2 weeks Pertinent Negatives: Patient denies swelling in legs, no chest pain, no accidents or injuries Disposition: '[]'$ ED /'[]'$ Urgent Care (no appt availability in office) / '[]'$ Appointment(In office/virtual)/ '[]'$  [facility] [facility]/ '[]'$ Home Care/ '[]'$ Refused Recommended Disposition /'[]'$ [place] [facility]/ '[x]'$  Follow-up with PCP Additional Notes: While getting pt scheduled she mentioned she did not have transportation until Feb.  To make her appt then.   While scheduling her she kept saying I don't know when I can come.  She is to get a new insurance card where she will have transportation through insurance co but it won't arrive until Feb.   She decided to call us back when she gets her insurance card and can arrange transportation.   I let her know if the pain get too ba d o go to the ED.     She doesn't have anyone to help her with transportation.

## 2022-10-03 NOTE — Telephone Encounter (Signed)
Tried calling again will wait for pt to return call, no VM

## 2022-10-04 MED FILL — Iron Sucrose Inj 20 MG/ML (Fe Equiv): INTRAVENOUS | Qty: 10 | Status: AC

## 2022-10-05 ENCOUNTER — Other Ambulatory Visit: Payer: Medicare Other | Admitting: Urology

## 2022-10-05 ENCOUNTER — Inpatient Hospital Stay: Payer: Medicare HMO

## 2022-10-06 ENCOUNTER — Ambulatory Visit (INDEPENDENT_AMBULATORY_CARE_PROVIDER_SITE_OTHER): Payer: Medicare HMO | Admitting: Physician Assistant

## 2022-10-06 DIAGNOSIS — C679 Malignant neoplasm of bladder, unspecified: Secondary | ICD-10-CM

## 2022-10-06 DIAGNOSIS — D494 Neoplasm of unspecified behavior of bladder: Secondary | ICD-10-CM

## 2022-10-06 LAB — URINALYSIS, COMPLETE
Bilirubin, UA: NEGATIVE
Glucose, UA: NEGATIVE
Ketones, UA: NEGATIVE
Leukocytes,UA: NEGATIVE
Nitrite, UA: NEGATIVE
Specific Gravity, UA: >1.03 — ABNORMAL HIGH (ref 1.005–1.030)
Urobilinogen, Ur: 1 mg/dL (ref 0.2–1.0)
pH, UA: 5 (ref 5.0–7.5)

## 2022-10-06 LAB — MICROSCOPIC EXAMINATION: Epithelial Cells (non renal): >10 /hpf — AB (ref 0–10)

## 2022-10-06 MED ORDER — BCG LIVE 50 MG IS SUSR
3.2400 mL | Freq: Once | INTRAVESICAL | Status: AC
  Administered 2022-10-06: 81 mg via INTRAVESICAL

## 2022-10-06 NOTE — Progress Notes (Signed)
BCG Bladder Instillation  BCG # 4 of 6  Due to Bladder Cancer patient is present today for a BCG treatment. Patient was cleaned and prepped in a sterile fashion with betadine. A 14FR catheter was inserted, urine return was noted 64m, urine was yellow in color.  561mof reconstituted BCG was instilled into the bladder. The catheter was then removed. Patient tolerated well, no complications were noted  Performed by: SaDebroah LoopPA-C and AnTiffany KocherCMA  Follow up/ Additional notes: 1 week for BCG #5 of 6

## 2022-10-11 MED FILL — Iron Sucrose Inj 20 MG/ML (Fe Equiv): INTRAVENOUS | Qty: 10 | Status: AC

## 2022-10-12 ENCOUNTER — Inpatient Hospital Stay: Payer: Medicare HMO

## 2022-10-12 ENCOUNTER — Other Ambulatory Visit: Payer: Medicare Other | Admitting: Urology

## 2022-10-13 ENCOUNTER — Ambulatory Visit (INDEPENDENT_AMBULATORY_CARE_PROVIDER_SITE_OTHER): Payer: Medicare HMO | Admitting: Physician Assistant

## 2022-10-13 DIAGNOSIS — C678 Malignant neoplasm of overlapping sites of bladder: Secondary | ICD-10-CM

## 2022-10-13 LAB — MICROSCOPIC EXAMINATION: WBC, UA: >30 /hpf — AB (ref 0–5)

## 2022-10-13 LAB — URINALYSIS, COMPLETE
Bilirubin, UA: NEGATIVE
Glucose, UA: NEGATIVE
Ketones, UA: NEGATIVE
Nitrite, UA: NEGATIVE
Protein,UA: NEGATIVE
Specific Gravity, UA: 1.025 (ref 1.005–1.030)
Urobilinogen, Ur: 0.2 mg/dL (ref 0.2–1.0)
pH, UA: 5.5 (ref 5.0–7.5)

## 2022-10-13 MED ORDER — BCG LIVE 50 MG IS SUSR
3.2400 mL | Freq: Once | INTRAVESICAL | Status: AC
  Administered 2022-10-13: 81 mg via INTRAVESICAL

## 2022-10-13 NOTE — Progress Notes (Signed)
BCG Bladder Instillation  BCG # 5 of 6  Due to Bladder Cancer patient is present today for a BCG treatment. Patient was cleaned and prepped in a sterile fashion with betadine. A 14FR catheter was inserted, urine return was noted 20m, urine was yellow in color.  562mof reconstituted BCG was instilled into the bladder. The catheter was then removed. Patient tolerated well, no complications were noted  Performed by: SaDebroah LoopPA-C and AnTiffany KocherCMA  Additional notes: UA again suspicious today, however patient denies dysuria, urgency, or frequency. Will proceed with treatment, suspect asymptomatic bacteriuria. We discussed calling clinic if she develops infective sx this week so we can start antibiotics. She expressed understanding.  Follow up/ Additional notes: 1 week for BCG #6 of 6

## 2022-10-16 ENCOUNTER — Other Ambulatory Visit: Payer: Medicare Other | Admitting: Urology

## 2022-10-18 ENCOUNTER — Telehealth: Payer: Self-pay | Admitting: *Deleted

## 2022-10-18 LAB — CULTURE, URINE COMPREHENSIVE

## 2022-10-18 NOTE — Telephone Encounter (Signed)
Patient states she is not having any urinary symptoms  at this time.

## 2022-10-18 NOTE — Telephone Encounter (Signed)
-----  Message from Nori Riis, PA-C sent at 10/18/2022 12:10 PM EST ----- Will you check with Mrs. Speece and see if she is having an UTI symptoms?  If so, we need to prescribe an antibiotic.

## 2022-10-19 ENCOUNTER — Encounter: Payer: Self-pay | Admitting: Oncology

## 2022-10-19 ENCOUNTER — Other Ambulatory Visit: Payer: Medicare Other | Admitting: Urology

## 2022-10-21 ENCOUNTER — Encounter: Payer: Self-pay | Admitting: Oncology

## 2022-10-24 ENCOUNTER — Ambulatory Visit (INDEPENDENT_AMBULATORY_CARE_PROVIDER_SITE_OTHER): Payer: 59 | Admitting: Physician Assistant

## 2022-10-24 DIAGNOSIS — D494 Neoplasm of unspecified behavior of bladder: Secondary | ICD-10-CM | POA: Diagnosis not present

## 2022-10-24 MED ORDER — BCG LIVE 50 MG IS SUSR
3.2400 mL | Freq: Once | INTRAVESICAL | Status: AC
  Administered 2022-10-24: 81 mg via INTRAVESICAL

## 2022-10-24 NOTE — Progress Notes (Signed)
BCG Bladder Instillation  BCG # 6 of 6  Due to Bladder Cancer patient is present today for a BCG treatment. Patient was cleaned and prepped in a sterile fashion with betadine. A 14FR catheter was inserted, urine return was noted 35m, urine was yellow in color.  511mof reconstituted BCG was instilled into the bladder. The catheter was then removed. Patient tolerated well, no complications were noted  Performed by: SaDebroah LoopPA-C and JeGaspar ColaCMA  Additional notes: UA is contaminated today but she denies irritative voiding symptoms, low concern for infection at this time.   Follow up/ Additional notes: Return in about 2 months (around 12/23/2022) for Cysto with Dr. SnDiamantina Providence

## 2022-10-25 LAB — URINALYSIS, COMPLETE
Bilirubin, UA: NEGATIVE
Ketones, UA: NEGATIVE
Nitrite, UA: NEGATIVE
RBC, UA: NEGATIVE
Specific Gravity, UA: >1.03 — ABNORMAL HIGH (ref 1.005–1.030)
Urobilinogen, Ur: 1 mg/dL (ref 0.2–1.0)
pH, UA: 5 (ref 5.0–7.5)

## 2022-10-25 LAB — MICROSCOPIC EXAMINATION
Epithelial Cells (non renal): >10 /hpf — AB (ref 0–10)
WBC, UA: >30 /hpf — AB (ref 0–5)

## 2022-10-27 ENCOUNTER — Other Ambulatory Visit: Payer: Self-pay | Admitting: Internal Medicine

## 2022-10-27 DIAGNOSIS — G4700 Insomnia, unspecified: Secondary | ICD-10-CM

## 2022-10-27 NOTE — Telephone Encounter (Signed)
Requested medication (s) are due for refill today: yes  Requested medication (s) are on the active medication list: yes  Last refill:  08/07/22  Future visit scheduled: no  Notes to clinic:  Unable to refill per protocol, appointment needed.      Requested Prescriptions  Pending Prescriptions Disp Refills   traZODone (DESYREL) 50 MG tablet [Pharmacy Med Name: TRAZODONE HCL 50 MG TAB] 90 tablet 0    Sig: TAKE 1 TABLET BY MOUTH AT BEDTIME     Psychiatry: Antidepressants - Serotonin Modulator Failed - 10/27/2022  2:13 PM      Failed - Valid encounter within last 6 months    Recent Outpatient Visits           1 year ago Nonspecific chest pain   [facility] [place], Coralie Keens, NP   1 year ago Chest pain, unspecified type   [facility] [place], [place] W, NP   1 year ago Controlled type 2 diabetes mellitus with complication, without long-term current use of insulin [facility])   [facility] [place], [place]alie Keens, NP   1 year ago Chronic right shoulder pain   [facility] [place], Coralie Keens, NP   1 year ago Aortic atherosclerosis [facility])   [facility] [place], Coralie Keens, [place]              Passed - Completed PHQ-2 or PHQ-9 in the last 360 days

## 2022-11-02 ENCOUNTER — Other Ambulatory Visit: Payer: Self-pay | Admitting: Internal Medicine

## 2022-11-02 DIAGNOSIS — G4700 Insomnia, unspecified: Secondary | ICD-10-CM

## 2022-11-02 NOTE — Telephone Encounter (Signed)
Requested medications are due for refill today.  yes  Requested medications are on the active medications list.  yes  Last refill. 08/07/2022 #90   Future visit scheduled.   no  Notes to clinic.  Zhou listed as PCP.    Requested Prescriptions  Pending Prescriptions Disp Refills   traZODone (DESYREL) 50 MG tablet [Pharmacy Med Name: TRAZODONE HCL 50 MG TAB] 90 tablet 0    Sig: TAKE 1 TABLET BY MOUTH AT BEDTIME     Psychiatry: Antidepressants - Serotonin Modulator Failed - 11/02/2022  2:24 PM      Failed - Valid encounter within last 6 months    Recent Outpatient Visits           1 year ago Nonspecific chest pain   [facility] [place], Coralie Keens, NP   1 year ago Chest pain, unspecified type   [facility] [place], [place] W, NP   1 year ago Controlled type 2 diabetes mellitus with complication, without long-term current use of insulin [facility])   [facility] [place], Coralie Keens, NP   1 year ago Chronic right shoulder pain   [facility] [place], Coralie Keens, NP   1 year ago Aortic atherosclerosis [facility])   [facility] [place], Coralie Keens, [place]              Passed - Completed PHQ-2 or PHQ-9 in the last 360 days

## 2022-11-02 NOTE — Telephone Encounter (Signed)
Mandy from [facility] is calling in requesting a refill on the medication Trazodone. Leafy Ro says she has been trying to get the fill for over a month now. Please follow up with [facility].

## 2022-11-17 ENCOUNTER — Ambulatory Visit: Payer: 59 | Admitting: Internal Medicine

## 2022-11-22 ENCOUNTER — Encounter: Payer: Self-pay | Admitting: Oncology

## 2022-11-23 ENCOUNTER — Ambulatory Visit: Payer: 59 | Admitting: Internal Medicine

## 2022-11-29 ENCOUNTER — Telehealth: Payer: Self-pay | Admitting: Oncology

## 2022-11-29 ENCOUNTER — Inpatient Hospital Stay: Payer: 59 | Attending: Oncology

## 2022-11-29 MED FILL — Iron Sucrose Inj 20 MG/ML (Fe Equiv): INTRAVENOUS | Qty: 10 | Status: AC

## 2022-11-29 NOTE — Telephone Encounter (Signed)
LVM for pt and her son to r/s labs prior to MD appt on 3/14 if iron is needed.

## 2022-11-30 ENCOUNTER — Inpatient Hospital Stay: Payer: 59

## 2022-11-30 ENCOUNTER — Inpatient Hospital Stay: Payer: 59 | Admitting: Oncology

## 2022-11-30 NOTE — Assessment & Plan Note (Deleted)
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 Labs reviewed and discussed with patient Free light chain ratio is stable.  M protein negative. 24-hour urine protein electrophoresis, result is pending. Continue observation. 

## 2022-11-30 NOTE — Assessment & Plan Note (Deleted)
Acute on chronic anemia, Additional blood work up test today showed decrease ferritin and iron saturation.  Consistent with iron deficiency anemia.  Likely due to recent hematuria. I discussed option of proceed with IV Venofer treatments. I discussed about the potential risks including but not limited to allergic reactions/infusion reactions including anaphylactic reactions, phlebitis, high blood pressure, wheezing, SOB, skin rash, weight gain, leg swelling, headache, nausea and fatigue, etc. patient agrees with the plan.  Plan IV venofer weekly x 4  

## 2022-12-04 ENCOUNTER — Ambulatory Visit: Payer: 59 | Admitting: Internal Medicine

## 2022-12-25 ENCOUNTER — Ambulatory Visit: Payer: 59 | Admitting: Internal Medicine

## 2022-12-25 ENCOUNTER — Ambulatory Visit: Payer: Self-pay

## 2022-12-25 NOTE — Progress Notes (Deleted)
Subjective:    Patient ID: Kendra [name], female    DOB: 04-11-1946, 77 y.o.   MRN: [id number]  HPI    Review of Systems     Past Medical History:  Diagnosis Date   Anemia    Aortic atherosclerosis (HCC)    Asthma    Back pain    Bladder tumor    Carotid artery stenosis    Cataract    Cervical disc disorder    COPD (chronic obstructive pulmonary disease) (HCC)    Depression    Diabetes (HCC)    type II   Diabetic neuropathy (HCC)    Dyspnea    with exertion   GERD (gastroesophageal reflux disease)    Hematuria    History of kidney stones    per patient "can't remember the year"   Hyperlipidemia    Hypertension    per patient "take meds for it"   Insomnia    Neuropathy    Osteoarthritis    Osteopenia    Pneumonia 2018   per patient   Reflux    Rheumatoid arthritis (HCC)    Stroke (HCC) 2015   and again 2017  - Weakness in left leg, staggers w/ walking, vision    Tenosynovitis     Current Outpatient Medications  Medication Sig Dispense Refill   Accu-Chek Softclix Lancets lancets USE AS DIRECTED. UP TO 4 TIMES A DAY DX: E11.9 400 each 0   Acetaminophen (TYLENOL 8 HOUR ARTHRITIS PAIN PO) Take 2 capsules by mouth in the morning, at noon, in the evening, and at bedtime.     albuterol (VENTOLIN HFA) 108 (90 Base) MCG/ACT inhaler INHALE 1-2 PUFFS BY MOUTH EVERY 6 HOURS AS NEEDED WHEEZING/ SHORTNESS OF BREATH (Patient taking differently: 1-2 puffs every 6 (six) hours as needed. INHALE 1-2 PUFFS BY MOUTH EVERY 6 HOURS AS NEEDED WHEEZING/ SHORTNESS OF BREATH) 8.5 g 1   aspirin EC 81 MG tablet Take 1 tablet (81 mg total) by mouth daily. Swallow whole. 90 tablet 1   atorvastatin (LIPITOR) 40 MG tablet TAKE 1 TABLET BY MOUTH BEDTIME 90 tablet 0   blood glucose meter kit and supplies KIT Dispense based on patient and insurance preference. Use up to four times daily as directed. 1 each 0   Blood Glucose Monitoring Suppl (ONETOUCH VERIO) w/Device KIT USE AS DIRECTED 1 kit  0   buPROPion (WELLBUTRIN XL) 150 MG 24 hr tablet TAKE 1 TABLET BY MOUTH ONCE DAILY ** NEED TO KEEP UPCOMING APPOINTMENT!!1 90 tablet 0   glucose blood (ACCU-CHEK GUIDE) test strip Use as directed up to 4 times daily. DX: E11.9 400 each 0   Lancet Devices (ONE TOUCH DELICA LANCING DEV) MISC 1 Device by Does not apply route 2 (two) times daily. 200 each 4   traZODone (DESYREL) 50 MG tablet Take 1 tablet (50 mg total) by mouth at bedtime. 90 tablet 0   TRELEGY ELLIPTA 100-62.5-25 MCG/ACT AEPB USE ONE INHALATION INTO THE LUNGS ONCE A DAY RINSE MOUTH AFTER EACH USE (Patient taking differently: Inhale 1 puff into the lungs at bedtime.) 60 each 2   No current facility-administered medications for this visit.    Allergies  Allergen Reactions   Citalopram Nausea And Vomiting    Family History  Problem Relation Age of Onset   Stroke Father    Depression Father    Diabetes Mother    Hyperlipidemia Mother    Breast cancer Mother    Heart murmur Son  Heart murmur Son    Heart murmur Son     Social History   Socioeconomic History   Marital status: Widowed    Spouse name: Not on file   Number of children: 3   Years of education: 4811   Highest education level: Not on file  Occupational History   Occupation: Disabled  Tobacco Use   Smoking status: Every Day    Packs/day: 0.25    Years: 61.00    Additional pack years: 0.00    Total pack years: 15.25    Types: Cigarettes   Smokeless tobacco: Former    Types: Snuff  Vaping Use   Vaping Use: Never used  Substance and Sexual Activity   Alcohol use: No   Drug use: No   Sexual activity: Never  Other Topics Concern   Not on file  Social History Narrative   Not on file   Social Determinants of Health   Financial Resource Strain: Medium Risk (01/04/2022)   Overall Financial Resource Strain (CARDIA)    Difficulty of Paying Living Expenses: Somewhat hard  Food Insecurity: No Food Insecurity (05/08/2022)   Hunger Vital Sign     Worried About Running Out of Food in the Last Year: Never true    Ran Out of Food in the Last Year: Never true  Transportation Needs: Unmet Transportation Needs (08/09/2022)   PRAPARE - Transportation    Lack of Transportation (Medical): Yes    Lack of Transportation (Non-Medical): Yes  Physical Activity: Inactive (11/14/2021)   Exercise Vital Sign    Days of Exercise per Week: 0 days    Minutes of Exercise per Session: 0 min  Stress: Stress Concern Present (01/04/2022)   [employer] - Occupational Stress Questionnaire    Feeling of Stress : Rather much  Social Connections: Socially Isolated (11/14/2021)   Social Connection and Isolation Panel [NHANES]    Frequency of Communication with Friends and Family: More than three times a week    Frequency of Social Gatherings with Friends and Family: More than three times a week    Attends Religious Services: Never    Database administratorActive Member of Clubs or Organizations: No    Attends BankerClub or Organization Meetings: Never    Marital Status: Widowed  Intimate Partner Violence: Not At Risk (05/08/2022)   Humiliation, Afraid, Rape, and Kick questionnaire    Fear of Current or Ex-Partner: No    Emotionally Abused: No    Physically Abused: No    Sexually Abused: No     Constitutional: Denies fever, malaise, fatigue, headache or abrupt weight changes.  HEENT: Denies eye pain, eye redness, ear pain, ringing in the ears, wax buildup, runny nose, nasal congestion, bloody nose, or sore throat. Respiratory: Denies difficulty breathing, shortness of breath, cough or sputum production.   Cardiovascular: Denies chest pain, chest tightness, palpitations or swelling in the hands or feet.  Gastrointestinal: Denies abdominal pain, bloating, constipation, diarrhea or blood in the stool.  GU: Denies urgency, frequency, pain with urination, burning sensation, blood in urine, odor or discharge. Musculoskeletal: Denies decrease in range of motion,  difficulty with gait, muscle pain or joint pain and swelling.  Skin: Denies redness, rashes, lesions or ulcercations.  Neurological: Denies dizziness, difficulty with memory, difficulty with speech or problems with balance and coordination.  Psych: Denies anxiety, depression, SI/HI.  No other specific complaints in a complete review of systems (except as listed in HPI above).  Objective:   Physical Exam   There  were no vitals taken for this visit. Wt Readings from Last 3 Encounters:  08/29/22 113 lb (51.3 kg)  08/28/22 113 lb (51.3 kg)  06/15/22 110 lb 3.7 oz (50 kg)    General: Appears their stated age, well developed, well nourished in NAD. Skin: Warm, dry and intact. No rashes, lesions or ulcerations noted. HEENT: Head: normal shape and size; Eyes: sclera white, no icterus, conjunctiva pink, PERRLA and EOMs intact; Ears: Tm's gray and intact, normal light reflex; Nose: mucosa pink and moist, septum midline; Throat/Mouth: Teeth present, mucosa pink and moist, no exudate, lesions or ulcerations noted.  Neck:  Neck supple, trachea midline. No masses, lumps or thyromegaly present.  Cardiovascular: Normal rate and rhythm. S1,S2 noted.  No murmur, rubs or gallops noted. No JVD or BLE edema. No carotid bruits noted. Pulmonary/Chest: Normal effort and positive vesicular breath sounds. No respiratory distress. No wheezes, rales or ronchi noted.  Abdomen: Soft and nontender. Normal bowel sounds. No distention or masses noted. Liver, spleen and kidneys non palpable. Musculoskeletal: Normal range of motion. No signs of joint swelling. No difficulty with gait.  Neurological: Alert and oriented. Cranial nerves II-XII grossly intact. Coordination normal.  Psychiatric: Mood and affect normal. Behavior is normal. Judgment and thought content normal.     BMET    Component Value Date/Time   NA 139 08/22/2022 1209   NA 141 01/18/2016 1150   NA 138 09/01/2014 2130   K 4.0 08/22/2022 1209   K 3.5  09/01/2014 2130   CL 104 08/22/2022 1209   CL 104 09/01/2014 2130   CO2 27 08/22/2022 1209   CO2 28 09/01/2014 2130   GLUCOSE 128 (H) 08/22/2022 1209   GLUCOSE 287 (H) 09/01/2014 2130   BUN 20 08/22/2022 1209   BUN 12 01/18/2016 1150   BUN 14 09/01/2014 2130   CREATININE 0.77 08/22/2022 1209   CREATININE 0.85 08/01/2021 1021   CALCIUM 9.3 08/22/2022 1209   CALCIUM 8.9 09/01/2014 2130   GFRNONAA >60 08/22/2022 1209   GFRNONAA 70 06/14/2020 0929   GFRAA 82 06/14/2020 0929    Lipid Panel     Component Value Date/Time   CHOL 135 08/01/2021 1021   CHOL 177 06/07/2015 1215   CHOL 141 09/02/2014 0440   TRIG 89 08/01/2021 1021   TRIG 155 09/02/2014 0440   HDL 65 08/01/2021 1021   HDL 56 06/07/2015 1215   HDL 37 (L) 09/02/2014 0440   CHOLHDL 2.1 08/01/2021 1021   VLDL 16 10/17/2019 0606   VLDL 31 09/02/2014 0440   LDLCALC 53 08/01/2021 1021   LDLCALC 73 09/02/2014 0440    CBC    Component Value Date/Time   WBC 5.8 08/22/2022 1209   RBC 3.11 (L) 08/22/2022 1209   HGB 8.5 (L) 08/22/2022 1209   HGB 12.7 09/01/2014 2130   HCT 27.1 (L) 08/22/2022 1209   HCT 37.9 09/01/2014 2130   PLT 307 08/22/2022 1209   PLT 260 09/05/2014 0517   MCV 87.1 08/22/2022 1209   MCV 92 09/01/2014 2130   MCH 27.3 08/22/2022 1209   MCHC 31.4 08/22/2022 1209   RDW 14.6 08/22/2022 1209   RDW 13.9 09/01/2014 2130   LYMPHSABS 1.7 08/22/2022 1209   MONOABS 0.3 08/22/2022 1209   EOSABS 0.1 08/22/2022 1209   BASOSABS 0.0 08/22/2022 1209    Hgb A1C Lab Results  Component Value Date   HGBA1C 7.7 (H) 08/01/2021           Assessment & Plan:  Webb Silversmith, NP

## 2022-12-25 NOTE — Telephone Encounter (Signed)
     Chief Complaint: Burned top of left foot last week. Blisters have popped, has pain. Had appointment today, but does not have transportation. Did not have enough time to notify ride. Rescheduled appointment at her request. Symptoms: Above Frequency: Last week Pertinent Negatives: Patient denies any redness or signs of infection. Disposition: [] ED /[] Urgent Care (no appt availability in office) / [x] Appointment(In office/virtual)/ []  [place] [facility]/ [] Home Care/ [] Refused Recommended Disposition /[] [place] [facility]/ []  Follow-up with PCP Additional Notes: Instructed to call back with any signs of infection, redness, swelling, increased pain, drainage.  Reason for Disposition  [1] Broken (ruptured) blister AND [2] caller doesn't want to trim the dead skin  Answer Assessment - Initial Assessment Questions 1. ONSET: "When did it happen?" If happened < 3 hours ago, ask: "Did you apply cold water?" If not, give First Aid Advice immediately.      Last week 2. LOCATION: "Where is the burn located?"      Left foot - top 3. BURN SIZE: "How large is the burn?"  The palm is roughly 1% of the total body surface area (BSA).     Big toe across 3 toes and ankle 4. SEVERITY OF THE BURN: "Are there any blisters?"      Yes - blisters popped 5. MECHANISM: "Tell me how it happened."     Boiling water 6. PAIN: "Are you having any pain?" "How bad is the pain?" (Scale 1-10; or mild, moderate, severe)   - MILD (1-3): doesn't interfere with normal activities    - MODERATE (4-7): interferes with normal activities or awakens from sleep    - SEVERE (8-10): excruciating pain, unable to do any normal activities      Now - 8 7. INHALATION INJURY: "Were you exposed to any smoke or fumes?" If Yes, ask: "Do you have any cough or difficulty breathing?"     No 8. OTHER SYMPTOMS: "Do you have any other symptoms?" (e.g., headache, nausea)     No 9. PREGNANCY: "Is there any chance you are pregnant?"  "When was your last menstrual period?"     No  Protocols used: Burns - Thermal-A-AH

## 2022-12-27 ENCOUNTER — Other Ambulatory Visit: Payer: 59 | Admitting: Urology

## 2022-12-28 ENCOUNTER — Ambulatory Visit: Payer: 59 | Admitting: Internal Medicine

## 2022-12-29 ENCOUNTER — Other Ambulatory Visit: Payer: Self-pay | Admitting: Internal Medicine

## 2022-12-29 NOTE — Telephone Encounter (Signed)
Requested Prescriptions  Pending Prescriptions Disp Refills   buPROPion (WELLBUTRIN XL) 150 MG 24 hr tablet [Pharmacy Med Name: BUPROPION HCL ER (XL) 150 MG TAB] 90 tablet 0    Sig: TAKE 1 TABLET BY MOUTH ONCE DAILY ** NEED TO KEEP UPCOMING APPOINTMENT!!1     Psychiatry: Antidepressants - bupropion Failed - 12/29/2022 11:18 AM      Failed - Valid encounter within last 6 months    Recent Outpatient Visits           1 year ago Nonspecific chest pain   [place] [facility] [place], [name], NP   1 year ago Chest pain, unspecified type   [place] [facility] [place], [place] W, NP   1 year ago Controlled type 2 diabetes mellitus with complication, without long-term current use of insulin [facility])   [place] [name] [place], [place] W, NP   1 year ago Chronic right shoulder pain   [place] [facility] [place], [place] W, NP   1 year ago Aortic atherosclerosis [facility])   [place] [facility] [place], [name], NP       Future Appointments             In 4 days [place], [name], NP [place] [facility]            Passed - Cr in normal range and within 360 days    Creat  Date Value Ref Range Status  08/01/2021 0.85 0.60 - 1.00 mg/dL Final   Creatinine, Ser  Date Value Ref Range Status  08/22/2022 0.77 0.44 - 1.00 mg/dL Final   Creatinine, Urine  Date Value Ref Range Status  05/31/2021 89 20 - 275 mg/dL Final         Passed - AST in normal range and within 360 days    AST  Date Value Ref Range Status  08/22/2022 20 15 - 41 U/L Final   SGOT(AST)  Date Value Ref Range Status  09/01/2014 20 15 - 37 Unit/L Final         Passed - ALT in normal range and within 360 days    ALT  Date Value Ref Range Status  08/22/2022 17 0 - 44 U/L Final   SGPT (ALT)  Date Value Ref Range Status  09/01/2014 24 U/L Final    Comment:    14-63 NOTE: New Reference Range 04/07/14           Passed - Completed PHQ-2 or PHQ-9 in the last 360 days      Passed - Last BP in normal range    BP Readings from Last 1 Encounters:  08/29/22 129/82

## 2023-01-02 ENCOUNTER — Other Ambulatory Visit: Payer: Self-pay | Admitting: Internal Medicine

## 2023-01-02 ENCOUNTER — Encounter: Payer: Self-pay | Admitting: Internal Medicine

## 2023-01-02 ENCOUNTER — Ambulatory Visit (INDEPENDENT_AMBULATORY_CARE_PROVIDER_SITE_OTHER): Payer: 59 | Admitting: Internal Medicine

## 2023-01-02 VITALS — BP 112/78 | HR 84 | Temp 96.8°F | Wt 122.0 lb

## 2023-01-02 DIAGNOSIS — K219 Gastro-esophageal reflux disease without esophagitis: Secondary | ICD-10-CM | POA: Diagnosis not present

## 2023-01-02 DIAGNOSIS — I700 Atherosclerosis of aorta: Secondary | ICD-10-CM | POA: Diagnosis not present

## 2023-01-02 DIAGNOSIS — J410 Simple chronic bronchitis: Secondary | ICD-10-CM

## 2023-01-02 DIAGNOSIS — F5101 Primary insomnia: Secondary | ICD-10-CM

## 2023-01-02 DIAGNOSIS — F324 Major depressive disorder, single episode, in partial remission: Secondary | ICD-10-CM

## 2023-01-02 DIAGNOSIS — D500 Iron deficiency anemia secondary to blood loss (chronic): Secondary | ICD-10-CM

## 2023-01-02 DIAGNOSIS — T25122A Burn of first degree of left foot, initial encounter: Secondary | ICD-10-CM

## 2023-01-02 DIAGNOSIS — E785 Hyperlipidemia, unspecified: Secondary | ICD-10-CM

## 2023-01-02 DIAGNOSIS — E1159 Type 2 diabetes mellitus with other circulatory complications: Secondary | ICD-10-CM

## 2023-01-02 DIAGNOSIS — E118 Type 2 diabetes mellitus with unspecified complications: Secondary | ICD-10-CM | POA: Diagnosis not present

## 2023-01-02 DIAGNOSIS — J432 Centrilobular emphysema: Secondary | ICD-10-CM

## 2023-01-02 DIAGNOSIS — C679 Malignant neoplasm of bladder, unspecified: Secondary | ICD-10-CM

## 2023-01-02 DIAGNOSIS — G8929 Other chronic pain: Secondary | ICD-10-CM

## 2023-01-02 DIAGNOSIS — I152 Hypertension secondary to endocrine disorders: Secondary | ICD-10-CM

## 2023-01-02 DIAGNOSIS — M25511 Pain in right shoulder: Secondary | ICD-10-CM | POA: Diagnosis not present

## 2023-01-02 DIAGNOSIS — R634 Abnormal weight loss: Secondary | ICD-10-CM

## 2023-01-02 DIAGNOSIS — G4700 Insomnia, unspecified: Secondary | ICD-10-CM

## 2023-01-02 DIAGNOSIS — M159 Polyosteoarthritis, unspecified: Secondary | ICD-10-CM | POA: Diagnosis not present

## 2023-01-02 DIAGNOSIS — J449 Chronic obstructive pulmonary disease, unspecified: Secondary | ICD-10-CM

## 2023-01-02 DIAGNOSIS — E1169 Type 2 diabetes mellitus with other specified complication: Secondary | ICD-10-CM | POA: Diagnosis not present

## 2023-01-02 DIAGNOSIS — I6523 Occlusion and stenosis of bilateral carotid arteries: Secondary | ICD-10-CM

## 2023-01-02 DIAGNOSIS — I69354 Hemiplegia and hemiparesis following cerebral infarction affecting left non-dominant side: Secondary | ICD-10-CM | POA: Diagnosis not present

## 2023-01-02 DIAGNOSIS — M150 Primary generalized (osteo)arthritis: Secondary | ICD-10-CM

## 2023-01-02 DIAGNOSIS — D509 Iron deficiency anemia, unspecified: Secondary | ICD-10-CM | POA: Diagnosis not present

## 2023-01-02 DIAGNOSIS — M8588 Other specified disorders of bone density and structure, other site: Secondary | ICD-10-CM

## 2023-01-02 LAB — CBC: RBC: 3.62 10*6/uL — ABNORMAL LOW (ref 3.80–5.10)

## 2023-01-02 MED ORDER — TRAZODONE HCL 50 MG PO TABS
50.0000 mg | ORAL_TABLET | Freq: Every day | ORAL | 0 refills | Status: DC
Start: 2023-01-02 — End: 2023-03-29

## 2023-01-02 MED ORDER — SILVER SULFADIAZINE 1 % EX CREA: 1.0000 | TOPICAL_CREAM | Freq: Every day | CUTANEOUS | 0 refills | Status: DC

## 2023-01-02 MED ORDER — BUPROPION HCL ER (XL) 150 MG PO TB24: ORAL_TABLET | ORAL | 0 refills | Status: DC

## 2023-01-02 MED ORDER — TRELEGY ELLIPTA 100-62.5-25 MCG/ACT IN AEPB
1.0000 | INHALATION_SPRAY | Freq: Every evening | RESPIRATORY_TRACT | 0 refills | Status: DC
Start: 2023-01-02 — End: 2023-05-17

## 2023-01-02 MED ORDER — ALBUTEROL SULFATE HFA 108 (90 BASE) MCG/ACT IN AERS
1.0000 | INHALATION_SPRAY | Freq: Four times a day (QID) | RESPIRATORY_TRACT | 1 refills | Status: AC | PRN
Start: 2023-01-02 — End: ?

## 2023-01-02 NOTE — Assessment & Plan Note (Signed)
C-Met and lipid profile today Encouraged her to consume a low-fat diet We will have her restart atorvastatin, aspirin 

## 2023-01-02 NOTE — Assessment & Plan Note (Signed)
Will restart trazodone

## 2023-01-02 NOTE — Assessment & Plan Note (Signed)
Will restart bupropion Support offered 

## 2023-01-02 NOTE — Assessment & Plan Note (Signed)
Continue Tylenol arthritis ?

## 2023-01-02 NOTE — Assessment & Plan Note (Signed)
Will obtain MRI of the right shoulder

## 2023-01-02 NOTE — Assessment & Plan Note (Signed)
She declines bone density She declines starting calcium and vitamin D OTC

## 2023-01-02 NOTE — Assessment & Plan Note (Signed)
Encourage smoking cessation Will have her restart Trelegy and use albuterol as needed

## 2023-01-02 NOTE — Assessment & Plan Note (Addendum)
C-Met and lipid profile today Encouraged her to consume low-fat diet We will have her restart atorvastatin

## 2023-01-02 NOTE — Assessment & Plan Note (Signed)
Normotensive off meds We will monitor

## 2023-01-02 NOTE — Assessment & Plan Note (Signed)
She will continue BCG treatments She will continue to follow with urology and oncology

## 2023-01-02 NOTE — Assessment & Plan Note (Signed)
C-Met and lipid profile today Encouraged her to consume a low-fat diet We will have her restart atorvastatin, aspirin

## 2023-01-02 NOTE — Patient Instructions (Signed)

## 2023-01-02 NOTE — Assessment & Plan Note (Signed)
Try to avoid foods that trigger reflux Okay to take Tums OTC as needed

## 2023-01-02 NOTE — Assessment & Plan Note (Signed)
A1c and urine microalbumin today Encouraged her to consume a low-carb diet Encouraged routine eye exam Foot exam today Encouraged her to get a flu shot in the fall She declines Prevnar 20 today Encouraged her to her COVID booster

## 2023-01-02 NOTE — Assessment & Plan Note (Signed)
CBC and iron panel today She will continue to follow with hematology

## 2023-01-02 NOTE — Progress Notes (Signed)
Subjective:    Patient ID: Kendra [name], female    DOB: June 03, 1946, 77 y.o.   MRN: [id number]  HPI  Pt presents to the clinic today for follow-up of chronic conditions.  HTN: Her BP today is 112/78.  She is not taking any antihypertensive medication at this time.  ECG from 06/2022 reviewed.  HLD with Aortic Atherosclerosis, CAD status post CVA: She reports residual left-sided weakness.  Her last LDL was 53, triglycerides 89, 07/2021.  She is not currently taking her Atorvastatin, Plavix or Aspirin.  She does not consume low-fat diet.  DM 2: Her last A1c was 7.7%, 07/2021.  She is not taking any oral diabetic medication at this time.  She does not check her sugars.  She does not check her feet routinely.  Her last eye exam was 2021.  Flu 08/2022.  Pneumovax 07/2018.  Prevnar 11/2014.  COVID Pfizer x 2.  COPD: She reports chronic cough but denies shortness of breath.  She is using Albuterol only as needed.  She is not using Trelegy.  There are no PFTs on file.  She does continue to smoke.  GERD: Triggered by spicy foods.  She is not taking any medications for this at this time.  There is no upper GI on file.  OA: Mainly in her back.  She is taking Tylenol arthritis as needed with some relief of symptoms.  She does not follow with orthopedics.  Osteopenia: Her last bone density was >5 years ago.  She is not currently taking Calcium or Vitamin D OTC.  She does not get weightbearing exercise and daily  Depression: Deteriorated since she has been off her Bupropion.  She is not currently seeing a therapist.  She denies anxiety, SI/HI.  Iron Deficiency Anemia: Her last H/H was 8.7/27.1, 08/2022.  She is not taking any oral iron at this time but has been getting iron infusions.  She follows with heme allergy.  Insomnia: She has difficulty falling and staying asleep.  She is not currently taking any medications for this.  There is no sleep study on file.  Bladder Cancer: Status post tumor  removal.  She is currently getting BCG infusions.  She follows with urology and oncology.  She also reports a burn to her left foot.  This occurred 1 week ago after she dropped scolding hot coffee on her foot.  She has kept the area clean and dry but she is concerned that part of it may not be healing properly.  She has not noticed any pus draining from the area.  She denies fever, chills, nausea or vomiting.  She also reports chronic right shoulder pain.  This started over a year ago.  She describes the pain as sharp and stabbing.  The pain is worse with movement.  She does have some weakness of the right upper extremity.  She denies any injury to the area.  She is taken Tylenol arthritis OTC with minimal relief of symptoms.  X-ray from 04/2021 reviewed.  Review of Systems     Past Medical History:  Diagnosis Date   Anemia    Aortic atherosclerosis (HCC)    Asthma    Back pain    Bladder tumor    Carotid artery stenosis    Cataract    Cervical disc disorder    COPD (chronic obstructive pulmonary disease) (HCC)    Depression    Diabetes (HCC)    type II   Diabetic neuropathy (HCC)    Dyspnea  with exertion   GERD (gastroesophageal reflux disease)    Hematuria    History of kidney stones    per patient "can't remember the year"   Hyperlipidemia    Hypertension    per patient "take meds for it"   Insomnia    Neuropathy    Osteoarthritis    Osteopenia    Pneumonia 2018   per patient   Reflux    Rheumatoid arthritis (HCC)    Stroke (HCC) 2015   and again 2017  - Weakness in left leg, staggers w/ walking, vision    Tenosynovitis     Current Outpatient Medications  Medication Sig Dispense Refill   Accu-Chek Softclix Lancets lancets USE AS DIRECTED. UP TO 4 TIMES A DAY DX: E11.9 400 each 0   Acetaminophen (TYLENOL 8 HOUR ARTHRITIS PAIN PO) Take 2 capsules by mouth in the morning, at noon, in the evening, and at bedtime.     albuterol (VENTOLIN HFA) 108 (90 Base) MCG/ACT  inhaler INHALE 1-2 PUFFS BY MOUTH EVERY 6 HOURS AS NEEDED WHEEZING/ SHORTNESS OF BREATH (Patient taking differently: 1-2 puffs every 6 (six) hours as needed. INHALE 1-2 PUFFS BY MOUTH EVERY 6 HOURS AS NEEDED WHEEZING/ SHORTNESS OF BREATH) 8.5 g 1   aspirin EC 81 MG tablet Take 1 tablet (81 mg total) by mouth daily. Swallow whole. 90 tablet 1   atorvastatin (LIPITOR) 40 MG tablet TAKE 1 TABLET BY MOUTH BEDTIME 90 tablet 0   blood glucose meter kit and supplies KIT Dispense based on patient and insurance preference. Use up to four times daily as directed. 1 each 0   Blood Glucose Monitoring Suppl (ONETOUCH VERIO) w/Device KIT USE AS DIRECTED 1 kit 0   buPROPion (WELLBUTRIN XL) 150 MG 24 hr tablet TAKE 1 TABLET BY MOUTH ONCE DAILY ** NEED TO KEEP UPCOMING APPOINTMENT!!1 90 tablet 0   glucose blood (ACCU-CHEK GUIDE) test strip Use as directed up to 4 times daily. DX: E11.9 400 each 0   Lancet Devices (ONE TOUCH DELICA LANCING DEV) MISC 1 Device by Does not apply route 2 (two) times daily. 200 each 4   traZODone (DESYREL) 50 MG tablet Take 1 tablet (50 mg total) by mouth at bedtime. 90 tablet 0   TRELEGY ELLIPTA 100-62.5-25 MCG/ACT AEPB USE ONE INHALATION INTO THE LUNGS ONCE A DAY RINSE MOUTH AFTER EACH USE (Patient taking differently: Inhale 1 puff into the lungs at bedtime.) 60 each 2   No current facility-administered medications for this visit.    Allergies  Allergen Reactions   Citalopram Nausea And Vomiting    Family History  Problem Relation Age of Onset   Stroke Father    Depression Father    Diabetes Mother    Hyperlipidemia Mother    Breast cancer Mother    Heart murmur Son    Heart murmur Son    Heart murmur Son     Social History   Socioeconomic History   Marital status: Widowed    Spouse name: Not on file   Number of children: 3   Years of education: 47   Highest education level: Not on file  Occupational History   Occupation: Disabled  Tobacco Use   Smoking status:  Every Day    Packs/day: 0.25    Years: 61.00    Additional pack years: 0.00    Total pack years: 15.25    Types: Cigarettes   Smokeless tobacco: Former    Types: Snuff  Vaping Use  Vaping Use: Never used  Substance and Sexual Activity   Alcohol use: No   Drug use: No   Sexual activity: Never  Other Topics Concern   Not on file  Social History Narrative   Not on file   Social Determinants of Health   Financial Resource Strain: Medium Risk (01/04/2022)   Overall Financial Resource Strain (CARDIA)    Difficulty of Paying Living Expenses: Somewhat hard  Food Insecurity: No Food Insecurity (05/08/2022)   Hunger Vital Sign    Worried About Running Out of Food in the Last Year: Never true    Ran Out of Food in the Last Year: Never true  Transportation Needs: Unmet Transportation Needs (08/09/2022)   PRAPARE - Transportation    Lack of Transportation (Medical): Yes    Lack of Transportation (Non-Medical): Yes  Physical Activity: Inactive (11/14/2021)   Exercise Vital Sign    Days of Exercise per Week: 0 days    Minutes of Exercise per Session: 0 min  Stress: Stress Concern Present (01/04/2022)   [employer] of Occupational Health - Occupational Stress Questionnaire    Feeling of Stress : Rather much  Social Connections: Socially Isolated (11/14/2021)   Social Connection and Isolation Panel [NHANES]    Frequency of Communication with Friends and Family: More than three times a week    Frequency of Social Gatherings with Friends and Family: More than three times a week    Attends Religious Services: Never    Database administrator or Organizations: No    Attends Banker Meetings: Never    Marital Status: Widowed  Intimate Partner Violence: Not At Risk (05/08/2022)   Humiliation, Afraid, Rape, and Kick questionnaire    Fear of Current or Ex-Partner: No    Emotionally Abused: No    Physically Abused: No    Sexually Abused: No     Constitutional: Patient  reports unintentional weight loss.  Denies fever, malaise, fatigue, headache.  HEENT: Denies eye pain, eye redness, ear pain, ringing in the ears, wax buildup, runny nose, nasal congestion, bloody nose, or sore throat. Respiratory: Patient reports chronic cough.  Denies difficulty breathing, shortness of breath,  or sputum production.   Cardiovascular: Denies chest pain, chest tightness, palpitations or swelling in the hands or feet.  Gastrointestinal: Denies abdominal pain, bloating, constipation, diarrhea or blood in the stool.  GU: Denies urgency, frequency, pain with urination, burning sensation, blood in urine, odor or discharge. Musculoskeletal: Patient reports left-sided weakness, right shoulder pain and decreased range of motion.  Denies difficulty with gait, muscle pain or joint  swelling.  Skin: Patient reports burn to top of left foot.  Denies redness, rashes, lesions or ulcercations.  Neurological: Denies dizziness, difficulty with memory, difficulty with speech or problems with balance and coordination.  Psych: Patient has a history of depression.  Denies anxiety, SI/HI.  No other specific complaints in a complete review of systems (except as listed in HPI above).  Objective:   Physical Exam BP 112/78 (BP Location: Right Arm, Patient Position: Sitting, Cuff Size: Normal)   Pulse 84   Temp (!) 96.8 F (36 C) (Temporal)   Wt 122 lb (55.3 kg)   SpO2 96%   BMI 20.62 kg/m   Wt Readings from Last 3 Encounters:  08/29/22 113 lb (51.3 kg)  08/28/22 113 lb (51.3 kg)  06/15/22 110 lb 3.7 oz (50 kg)    General: Appears her stated age, chronically ill appearing, in NAD. Skin: Hypopigmented area noted to  the top of the left foot.  No blisters no redness no purulent drainage noted. Neck:  Neck supple, trachea midline. No masses, lumps or thyromegaly present.  Cardiovascular: Normal rate and rhythm. S1,S2 noted.  No murmur, rubs or gallops noted. No JVD or BLE edema. No carotid bruits  noted. Pulmonary/Chest: Normal effort and coarse breath sounds. No respiratory distress. No wheezes, rales or ronchi noted.  Abdomen: Soft and nontender. Normal bowel sounds.  Musculoskeletal: Decreased internal and external rotation of the right shoulder.  Negative drop can test on the right.  She has fullness noted within the proximal bicep.  Strength 5/5 BUE.  Handgrips equal.  Gait slow and steady with use of cane. Neurological: Alert and oriented.  Coordination normal.  Psychiatric: Mood and affect mildly flat. Behavior is normal. Judgment and thought content normal.     BMET    Component Value Date/Time   NA 139 08/22/2022 1209   NA 141 01/18/2016 1150   NA 138 09/01/2014 2130   K 4.0 08/22/2022 1209   K 3.5 09/01/2014 2130   CL 104 08/22/2022 1209   CL 104 09/01/2014 2130   CO2 27 08/22/2022 1209   CO2 28 09/01/2014 2130   GLUCOSE 128 (H) 08/22/2022 1209   GLUCOSE 287 (H) 09/01/2014 2130   BUN 20 08/22/2022 1209   BUN 12 01/18/2016 1150   BUN 14 09/01/2014 2130   CREATININE 0.77 08/22/2022 1209   CREATININE 0.85 08/01/2021 1021   CALCIUM 9.3 08/22/2022 1209   CALCIUM 8.9 09/01/2014 2130   GFRNONAA >60 08/22/2022 1209   GFRNONAA 70 06/14/2020 0929   GFRAA 82 06/14/2020 0929    Lipid Panel     Component Value Date/Time   CHOL 135 08/01/2021 1021   CHOL 177 06/07/2015 1215   CHOL 141 09/02/2014 0440   TRIG 89 08/01/2021 1021   TRIG 155 09/02/2014 0440   HDL 65 08/01/2021 1021   HDL 56 06/07/2015 1215   HDL 37 (L) 09/02/2014 0440   CHOLHDL 2.1 08/01/2021 1021   VLDL 16 10/17/2019 0606   VLDL 31 09/02/2014 0440   LDLCALC 53 08/01/2021 1021   LDLCALC 73 09/02/2014 0440    CBC    Component Value Date/Time   WBC 5.8 08/22/2022 1209   RBC 3.11 (L) 08/22/2022 1209   HGB 8.5 (L) 08/22/2022 1209   HGB 12.7 09/01/2014 2130   HCT 27.1 (L) 08/22/2022 1209   HCT 37.9 09/01/2014 2130   PLT 307 08/22/2022 1209   PLT 260 09/05/2014 0517   MCV 87.1 08/22/2022 1209    MCV 92 09/01/2014 2130   MCH 27.3 08/22/2022 1209   MCHC 31.4 08/22/2022 1209   RDW 14.6 08/22/2022 1209   RDW 13.9 09/01/2014 2130   LYMPHSABS 1.7 08/22/2022 1209   MONOABS 0.3 08/22/2022 1209   EOSABS 0.1 08/22/2022 1209   BASOSABS 0.0 08/22/2022 1209    Hgb A1C Lab Results  Component Value Date   HGBA1C 7.7 (H) 08/01/2021            Assessment & Plan:   Burn of Left Foot:  Rx for Silvadene cream-use as directed No evidence of infection, antibiotic not warranted  Unintentional Weight Loss:  Will check TSH today  RTC in 3 months, for your annual exam Nicki Reaper, NP

## 2023-01-02 NOTE — Assessment & Plan Note (Signed)
Using cane for assistance with gait C-Met and lipid profile today We will have her restart atorvastatin and aspirin

## 2023-01-03 ENCOUNTER — Telehealth: Payer: Self-pay

## 2023-01-03 ENCOUNTER — Encounter: Payer: Self-pay | Admitting: Oncology

## 2023-01-03 ENCOUNTER — Other Ambulatory Visit: Payer: Self-pay | Admitting: Internal Medicine

## 2023-01-03 ENCOUNTER — Telehealth: Payer: Self-pay | Admitting: Internal Medicine

## 2023-01-03 DIAGNOSIS — E785 Hyperlipidemia, unspecified: Secondary | ICD-10-CM

## 2023-01-03 LAB — CBC
HCT: 32.1 % — ABNORMAL LOW (ref 35.0–45.0)
Hemoglobin: 10.4 g/dL — ABNORMAL LOW (ref 11.7–15.5)
MCH: 28.7 pg (ref 27.0–33.0)
MCHC: 32.4 g/dL (ref 32.0–36.0)
MCV: 88.7 fL (ref 80.0–100.0)
MPV: 9.7 fL (ref 7.5–12.5)
Platelets: 283 10*3/uL (ref 140–400)
RDW: 16.4 % — ABNORMAL HIGH (ref 11.0–15.0)
WBC: 5.3 10*3/uL (ref 3.8–10.8)

## 2023-01-03 LAB — IRON,TIBC AND FERRITIN PANEL
%SAT: 23 % (calc) (ref 16–45)
Ferritin: 15 ng/mL — ABNORMAL LOW (ref 16–288)
Iron: 100 ug/dL (ref 45–160)
TIBC: 437 mcg/dL (calc) (ref 250–450)

## 2023-01-03 LAB — COMPLETE METABOLIC PANEL WITH GFR
AG Ratio: 1.6 (calc) (ref 1.0–2.5)
ALT: 19 U/L (ref 6–29)
AST: 17 U/L (ref 10–35)
Albumin: 4.2 g/dL (ref 3.6–5.1)
Alkaline phosphatase (APISO): 84 U/L (ref 37–153)
BUN: 20 mg/dL (ref 7–25)
CO2: 28 mmol/L (ref 20–32)
Calcium: 9.8 mg/dL (ref 8.6–10.4)
Chloride: 104 mmol/L (ref 98–110)
Creat: 0.88 mg/dL (ref 0.60–1.00)
Globulin: 2.6 g/dL (calc) (ref 1.9–3.7)
Glucose, Bld: 174 mg/dL — ABNORMAL HIGH (ref 65–99)
Potassium: 4.1 mmol/L (ref 3.5–5.3)
Sodium: 139 mmol/L (ref 135–146)
Total Bilirubin: 0.4 mg/dL (ref 0.2–1.2)
Total Protein: 6.8 g/dL (ref 6.1–8.1)
eGFR: 69 mL/min/{1.73_m2} (ref >=60)

## 2023-01-03 LAB — TSH: TSH: 0.73 mIU/L (ref 0.40–4.50)

## 2023-01-03 LAB — HEMOGLOBIN A1C
Hgb A1c MFr Bld: 7 % of total Hgb — ABNORMAL HIGH (ref <5.7)
Mean Plasma Glucose: 154 mg/dL
eAG (mmol/L): 8.5 mmol/L

## 2023-01-03 LAB — LIPID PANEL
Cholesterol: 130 mg/dL (ref <200)
HDL: 70 mg/dL (ref >=50)
LDL Cholesterol (Calc): 42 mg/dL (calc)
Non-HDL Cholesterol (Calc): 60 mg/dL (calc) (ref <130)
Total CHOL/HDL Ratio: 1.9 (calc) (ref <5.0)
Triglycerides: 98 mg/dL (ref <150)

## 2023-01-03 LAB — MICROALBUMIN / CREATININE URINE RATIO
Creatinine, Urine: 87 mg/dL (ref 20–275)
Microalb Creat Ratio: 11 mg/g creat (ref <30)
Microalb, Ur: 1 mg/dL

## 2023-01-03 MED ORDER — ATORVASTATIN CALCIUM 40 MG PO TABS
ORAL_TABLET | ORAL | 1 refills | Status: DC
Start: 2023-01-03 — End: 2023-05-17

## 2023-01-03 NOTE — Telephone Encounter (Signed)
-----   Message from [name], NP sent at 01/03/2023  9:49 AM EDT ----- Her anemia has improved.  Her iron levels are normal but her iron stores are still low.  Her liver and kidney function is normal.  Her A1c is down to 7%.  Does she want to start on oral diabetes medication at this time?  Reinforce low-carb diet.  Thyroid function is normal.  Cholesterol looks great.  Diabetes is not affecting her kidneys.

## 2023-01-03 NOTE — Telephone Encounter (Signed)
Attempted to reach patient to discuss results but no answer and unable to leave message.  Left message on sons voicemail for patient to call.  OK for [facility] to give patient results.

## 2023-01-03 NOTE — Telephone Encounter (Signed)
Noted  

## 2023-01-03 NOTE — Telephone Encounter (Signed)
Patient called to get lab results. Lab results given and patient verbal understanding. She states that she would not like to take the diabetes medication at this time.

## 2023-01-09 ENCOUNTER — Ambulatory Visit
Admission: RE | Admit: 2023-01-09 | Discharge: 2023-01-09 | Disposition: A | Discharge status: Home / Self Care | Payer: 59 | Source: Ambulatory Visit | Attending: Internal Medicine | Admitting: Internal Medicine

## 2023-01-09 DIAGNOSIS — M25511 Pain in right shoulder: Secondary | ICD-10-CM | POA: Insufficient documentation

## 2023-01-09 DIAGNOSIS — G8929 Other chronic pain: Secondary | ICD-10-CM | POA: Insufficient documentation

## 2023-01-15 NOTE — Addendum Note (Signed)
Addended by: [name] on: 01/15/2023 02:22 PM   Modules accepted: Orders

## 2023-01-25 ENCOUNTER — Encounter: Payer: Self-pay | Admitting: Internal Medicine

## 2023-01-25 ENCOUNTER — Ambulatory Visit (INDEPENDENT_AMBULATORY_CARE_PROVIDER_SITE_OTHER): Payer: 59 | Admitting: Internal Medicine

## 2023-01-25 VITALS — BP 115/76 | HR 68 | Temp 97.6°F | Resp 18 | Wt 120.0 lb

## 2023-01-25 DIAGNOSIS — M25511 Pain in right shoulder: Secondary | ICD-10-CM

## 2023-01-25 DIAGNOSIS — R252 Cramp and spasm: Secondary | ICD-10-CM | POA: Diagnosis not present

## 2023-01-25 DIAGNOSIS — G8929 Other chronic pain: Secondary | ICD-10-CM | POA: Diagnosis not present

## 2023-01-25 MED ORDER — METHOCARBAMOL 500 MG PO TABS
500.0000 mg | ORAL_TABLET | Freq: Three times a day (TID) | ORAL | 0 refills | Status: DC | PRN
Start: 2023-01-25 — End: 2023-04-10

## 2023-01-25 NOTE — Progress Notes (Signed)
Subjective:    Patient ID: Kendra [name], female    DOB: [birth year], 77 y.o.   MRN: [id number]  HPI  Patient presents to clinic today with complaint of shoulder pain.  This is a chronic issue that has worsened over the last few months.Cathlean Sauer of the right shoulder from 06/2021 showed:  IMPRESSION: 1. Mild acromioclavicular and glenohumeral degenerative change. 2. Subcortical cystic change in the lateral humeral head, can be seen with rotator cuff pathology.  She was seen 4/16 for the same.  MRI was ordered which showed:  IMPRESSION: 1. Moderate tendinosis of the supraspinatus tendon with mild subcortical reactive marrow edema at the insertion. 2. Severe tendinosis of the infraspinatus tendon with subcortical reactive marrow changes. 3. Moderate tendinosis of the subscapularis tendon. 4. Moderate tendinosis of the intra-articular portion of the long head of the biceps tendon. 5. Moderate-severe osteoarthritis of the glenohumeral joint.  She was referred for physical therapy but she has not been called about this appointment.  She is currently taking Tylenol OTC.  She has seen Dr. Hyacinth Meeker at [facility] in the past and would like to see him again.  She also reports muscle cramps in her legs.  She reports this occurs intermittently.  She reports this is keeping her awake at night.  She does not report severe muscle cramps with ambulation that improves with rest.  She does smoke.  She is not currently taking any medications for this.  Review of Systems   Past Medical History:  Diagnosis Date   Anemia    Aortic atherosclerosis (HCC)    Asthma    Back pain    Bladder tumor    Carotid artery stenosis    Cataract    Cervical disc disorder    COPD (chronic obstructive pulmonary disease) (HCC)    Depression    Diabetes (HCC)    type II   Diabetic neuropathy (HCC)    Dyspnea    with exertion   GERD (gastroesophageal reflux disease)    Hematuria    History of kidney stones     per patient "can't remember the year"   Hyperlipidemia    Hypertension    per patient "take meds for it"   Insomnia    Neuropathy    Osteoarthritis    Osteopenia    Pneumonia 2018   per patient   Reflux    Rheumatoid arthritis (HCC)    Stroke (HCC) 2015   and again 2017  - Weakness in left leg, staggers w/ walking, vision    Tenosynovitis     Current Outpatient Medications  Medication Sig Dispense Refill   Acetaminophen (TYLENOL 8 HOUR ARTHRITIS PAIN PO) Take 2 capsules by mouth in the morning, at noon, in the evening, and at bedtime.     albuterol (VENTOLIN HFA) 108 (90 Base) MCG/ACT inhaler Inhale 1-2 puffs into the lungs every 6 (six) hours as needed. INHALE 1-2 PUFFS BY MOUTH EVERY 6 HOURS AS NEEDED WHEEZING/ SHORTNESS OF BREATH 18 g 1   aspirin EC 81 MG tablet Take 1 tablet (81 mg total) by mouth daily. Swallow whole. 90 tablet 1   atorvastatin (LIPITOR) 40 MG tablet TAKE 1 TABLET BY MOUTH BEDTIME 90 tablet 1   buPROPion (WELLBUTRIN XL) 150 MG 24 hr tablet TAKE 1 TABLET BY MOUTH ONCE DAILY ** NEED TO KEEP UPCOMING APPOINTMENT!!1 90 tablet 0   Fluticasone-Umeclidin-Vilant (TRELEGY ELLIPTA) 100-62.5-25 MCG/ACT AEPB Inhale 1 puff into the lungs at bedtime. 60 each 0  silver sulfADIAZINE (SILVADENE) 1 % cream Apply 1 Application topically daily. 50 g 0   traZODone (DESYREL) 50 MG tablet Take 1 tablet (50 mg total) by mouth at bedtime. 90 tablet 0   No current facility-administered medications for this visit.    Allergies  Allergen Reactions   Citalopram Nausea And Vomiting    Family History  Problem Relation Age of Onset   Stroke Father    Depression Father    Diabetes Mother    Hyperlipidemia Mother    Breast cancer Mother    Heart murmur Son    Heart murmur Son    Heart murmur Son     Social History   Socioeconomic History   Marital status: Widowed    Spouse name: Not on file   Number of children: 3   Years of education: 23   Highest education level: Not on  file  Occupational History   Occupation: Disabled  Tobacco Use   Smoking status: Every Day    Packs/day: 0.25    Years: 61.00    Additional pack years: 0.00    Total pack years: 15.25    Types: Cigarettes   Smokeless tobacco: Former    Types: Snuff  Vaping Use   Vaping Use: Never used  Substance and Sexual Activity   Alcohol use: No   Drug use: No   Sexual activity: Never  Other Topics Concern   Not on file  Social History Narrative   Not on file   Social Determinants of Health   Financial Resource Strain: Medium Risk (01/04/2022)   Overall Financial Resource Strain (CARDIA)    Difficulty of Paying Living Expenses: Somewhat hard  Food Insecurity: No Food Insecurity (05/08/2022)   Hunger Vital Sign    Worried About Running Out of Food in the Last Year: Never true    Ran Out of Food in the Last Year: Never true  Transportation Needs: Unmet Transportation Needs (08/09/2022)   PRAPARE - Transportation    Lack of Transportation (Medical): Yes    Lack of Transportation (Non-Medical): Yes  Physical Activity: Inactive (11/14/2021)   Exercise Vital Sign    Days of Exercise per Week: 0 days    Minutes of Exercise per Session: 0 min  Stress: Stress Concern Present (01/04/2022)   [employer] of Occupational Health - Occupational Stress Questionnaire    Feeling of Stress : Rather much  Social Connections: Socially Isolated (11/14/2021)   Social Connection and Isolation Panel [NHANES]    Frequency of Communication with Friends and Family: More than three times a week    Frequency of Social Gatherings with Friends and Family: More than three times a week    Attends Religious Services: Never    Database administrator or Organizations: No    Attends Banker Meetings: Never    Marital Status: Widowed  Intimate Partner Violence: Not At Risk (05/08/2022)   Humiliation, Afraid, Rape, and Kick questionnaire    Fear of Current or Ex-Partner: No    Emotionally Abused: No     Physically Abused: No    Sexually Abused: No     Constitutional: Denies fever, malaise, fatigue, headache or abrupt weight changes.  Respiratory: Denies difficulty breathing, shortness of breath, cough or sputum production.   Cardiovascular: Denies chest pain, chest tightness, palpitations or swelling in the hands or feet.  Musculoskeletal: Patient reports right shoulder pain, decreased range of motion and muscle cramps.  Denies difficulty with gait, muscle pain or joint swelling.  Skin: Denies redness, rashes, lesions or ulcercations.  Neurological: Denies numbness, tingling, weakness or problems with balance and coordination.    No other specific complaints in a complete review of systems (except as listed in HPI above).      Objective:   Physical Exam BP 115/76   Pulse 68   Temp 97.6 F (36.4 C)   Resp 18   Wt 120 lb (54.4 kg)   SpO2 95%   BMI 20.28 kg/m   Wt Readings from Last 3 Encounters:  01/02/23 122 lb (55.3 kg)  08/29/22 113 lb (51.3 kg)  08/28/22 113 lb (51.3 kg)    General: Appears her stated age, chronically ill hearing, in NAD. HEENT: Head: normal shape and size; Eyes: sclera white, no icterus, conjunctiva pink, PERRLA and EOMs intact;  Cardiovascular: Normal rate and rhythm. S1,S2 noted.  No murmur, rubs or gallops noted. No JVD or BLE edema.  Radial and pedal pulses 2+ bilaterally. Pulmonary/Chest: Normal effort and diminished breath sounds. No respiratory distress. No wheezes, rales or ronchi noted.  Musculoskeletal: Decreased internal and external rotation of the right shoulder.  Generalized pain with palpation of the right shoulder.  No bony tenderness noted over the lumbar spine.  Gait slow and steady with use of cane. Neurological: Alert and oriented.     BMET    Component Value Date/Time   NA 139 01/02/2023 1332   NA 141 01/18/2016 1150   NA 138 09/01/2014 2130   K 4.1 01/02/2023 1332   K 3.5 09/01/2014 2130   CL 104 01/02/2023 1332   CL 104  09/01/2014 2130   CO2 28 01/02/2023 1332   CO2 28 09/01/2014 2130   GLUCOSE 174 (H) 01/02/2023 1332   GLUCOSE 287 (H) 09/01/2014 2130   BUN 20 01/02/2023 1332   BUN 12 01/18/2016 1150   BUN 14 09/01/2014 2130   CREATININE 0.88 01/02/2023 1332   CALCIUM 9.8 01/02/2023 1332   CALCIUM 8.9 09/01/2014 2130   GFRNONAA >60 08/22/2022 1209   GFRNONAA 70 06/14/2020 0929   GFRAA 82 06/14/2020 0929    Lipid Panel     Component Value Date/Time   CHOL 130 01/02/2023 1332   CHOL 177 06/07/2015 1215   CHOL 141 09/02/2014 0440   TRIG 98 01/02/2023 1332   TRIG 155 09/02/2014 0440   HDL 70 01/02/2023 1332   HDL 56 06/07/2015 1215   HDL 37 (L) 09/02/2014 0440   CHOLHDL 1.9 01/02/2023 1332   VLDL 16 10/17/2019 0606   VLDL 31 09/02/2014 0440   LDLCALC 42 01/02/2023 1332   LDLCALC 73 09/02/2014 0440    CBC    Component Value Date/Time   WBC 5.3 01/02/2023 1332   RBC 3.62 (L) 01/02/2023 1332   HGB 10.4 (L) 01/02/2023 1332   HGB 12.7 09/01/2014 2130   HCT 32.1 (L) 01/02/2023 1332   HCT 37.9 09/01/2014 2130   PLT 283 01/02/2023 1332   PLT 260 09/05/2014 0517   MCV 88.7 01/02/2023 1332   MCV 92 09/01/2014 2130   MCH 28.7 01/02/2023 1332   MCHC 32.4 01/02/2023 1332   RDW 16.4 (H) 01/02/2023 1332   RDW 13.9 09/01/2014 2130   LYMPHSABS 1.7 08/22/2022 1209   MONOABS 0.3 08/22/2022 1209   EOSABS 0.1 08/22/2022 1209   BASOSABS 0.0 08/22/2022 1209    Hgb A1C Lab Results  Component Value Date   HGBA1C 7.0 (H) 01/02/2023           Assessment & Plan:  Chronic Right Shoulder Pain:  Referral to orthopedics placed Be on the look out for call about physical therapy Continue Tylenol 3 times daily  Muscle Cramps:  Advised her to try Magnesium 400 mg at bedtime Rx for Methocarbamol 500 mg every 8 hours as needed-sedation caution given Encouraged calf stretching  RTC 2 months for annual exam Nicki Reaper, NP

## 2023-01-25 NOTE — Patient Instructions (Signed)
Shoulder Range of Motion Exercises Shoulder range of motion (ROM) exercises are done to keep the shoulder moving freely or to increase movement. They are recommended for people who have shoulder pain or stiffness or who are recovering from a shoulder surgery. Ask your health care provider which exercises are safe for you. Do exercises exactly as told by your health care provider and adjust them as directed. It is normal to feel mild stretching, pulling, tightness, or discomfort as you do these exercises. Stop right away if you feel sudden pain or your pain gets worse. Do not begin these exercises until told by your health care provider. Phase 1 exercise When you are able, do this exercise 1-2 times a day for 30-60 seconds in each direction, or as directed by your health care provider. Pendulum exercise     To do this exercise while sitting: Sit in a chair or at the edge of your bed with your feet flat on the floor. Let your affected arm hang down in front of you over the edge of the bed or chair. Relax your shoulder, arm, and hand. Rock your body so your arm gently swings in small circles. You can also use your unaffected arm to start the motion. Repeat, changing the direction of the circles, swinging your arm left and right, and swinging your arm forward and back. To do this exercise while standing: Stand next to a sturdy chair or table, and hold on to it with your hand on your unaffected side. Bend forward at the waist. Bend your knees slightly. Relax your shoulder, arm, and hand. While keeping your shoulder relaxed, use body motion to swing your arm in small circles. Repeat, changing the direction of the circles, swinging your arm left and right, and swinging your arm forward and back. Between exercises, stand up tall and take a short break to relax your lower back.  Phase 2 exercises Do these exercises 1-2 times a day or as told by your health care provider. Hold each stretch for 30  seconds, and repeat 3 times. Do the exercises with one or both arms as instructed by your health care provider. For these exercises, sit at a table with your hand and arm supported by the table. A chair that slides easily or has wheels can be helpful. External rotation  Turn your chair so that your affected side is nearest to the table. Place your forearm on the table to your side. Bend your arm to about a 90-degree angle (right angle) at the elbow, and place your hand palm-down on the table. Your elbow should be about 6 inches (15 cm) away from your side. Keeping your arm on the table, lean your body forward. Abduction  Turn your chair so that your affected side is nearest to the table. Place your forearm and hand on the table so that your thumb points toward the ceiling and your arm is straight out to your side. Slide your hand out to the side and away from you. To increase the stretch, you can slide your chair away from the table. Flexion: forward stretch  Sit facing the table. Place your hand and elbow on the table in front of you. Slide your hand forward and away from you, using your unaffected arm to do the work. To increase the stretch, you can slide your chair backward. Phase 3 exercises Do these exercises 1-2 times a day or as told by your health care provider. Hold each stretch for 30 seconds, and   repeat 3 times. Do the exercises with one or both arms as instructed by your health care provider. You will need a cane, a piece of PVC pipe, or a sturdy wooden dowel for the wand exercises. Cross-body stretch: posterior capsule stretch  Lift your arm straight out in front of you. Bend your arm in a 90-degree angle (right angle) at the elbow so your forearm moves across your body. Use your other arm to gently pull the elbow across your body, toward your other shoulder. Wall climbs  Stand with your affected arm extended out to the side with your hand resting on a door frame. Slide your  hand slowly up the door frame. To increase the stretch, step through the door frame. Keep your body upright and do not lean. Flexion     To do this exercise while standing: Hold the wand with both of your hands, palms-down. Lift the wand up and over your head, if able. Lift mostly with your affected arm, and use the other arm to help. Push upward with your other arm to gently increase the stretch. To do this exercise while lying down: Lie on your back with your elbows resting on the floor and the wand in both your hands. Your hands will be palm-down, or pointing toward your feet. Lift your hands toward the ceiling, using your unaffected arm to help if needed. Bring your arms overhead as able, using your unaffected arm to help if needed.  Internal rotation  Stand while holding the wand behind you with both hands. Your unaffected arm should be extended above your head with the arm of the affected side extended behind you at the level of your waist. The wand should be pointing straight up and down as you hold it. Slowly pull the wand up behind your back by straightening the elbow of your unaffected arm and bending the elbow of your affected arm. External rotation  Lie on your back with your affected upper arm supported on a small pillow or rolled towel. When you first do this exercise, keep your upper arm close to your body. Over time, bring your arm up to a 90-degree angle (right angle) out to the side. Hold the wand across your stomach and with both hands palm-up. Your elbow on your affected side should be bent at a 90-degree angle. Use your unaffected side to help push your forearm away from you and toward the floor. Keep your elbow on your affected side bent at a 90-degree angle. This information is not intended to replace advice given to you by your health care provider. Make sure you discuss any questions you have with your health care provider. Document Revised: 10/25/2021 Document  Reviewed: 10/25/2021 Elsevier Patient Education  2023 Elsevier Inc.  

## 2023-02-09 ENCOUNTER — Telehealth: Payer: Self-pay | Admitting: Internal Medicine

## 2023-02-09 NOTE — Telephone Encounter (Signed)
She is able to walk. She uses a cane but she is able to walk

## 2023-02-09 NOTE — Telephone Encounter (Signed)
Pt is calling to request if Rene Kocher can write a letter to the [employer] stating that the patient is unable to walk and is needing the mailbox moved besides her home. Please mail the letter when completed. Address verified in the chart.  Please advise CB- [phone]

## 2023-02-14 ENCOUNTER — Telehealth: Payer: Self-pay

## 2023-02-14 NOTE — Telephone Encounter (Signed)
Copied from CRM [id number]. Topic: General - Other >> Feb 14, 2023  2:43 PM Clide Dales wrote: Patient would like to discuss with provider getting approved for a mailbox on her porch. Please follow up with pateint.

## 2023-02-14 NOTE — Telephone Encounter (Signed)
I advised pt that since she is able to walk with her cane so you would not be able to approve having her mailbox moved.  Do you need her schedule an office visit with you to document the need to have her mailbox moved?   Thanks,   -Vernona Rieger

## 2023-02-14 NOTE — Telephone Encounter (Signed)
Pt advised.   Thanks,   -Bernardo Brayman  

## 2023-02-15 NOTE — Telephone Encounter (Signed)
Is she saying that she is unable to walk with her cane? If so, OV will be needed. Otherwise, if she is able to walk with her cane, I can not approve the moving of her mailbox

## 2023-02-16 DIAGNOSIS — M19011 Primary osteoarthritis, right shoulder: Secondary | ICD-10-CM | POA: Diagnosis not present

## 2023-02-16 DIAGNOSIS — M75101 Unspecified rotator cuff tear or rupture of right shoulder, not specified as traumatic: Secondary | ICD-10-CM | POA: Diagnosis not present

## 2023-02-16 DIAGNOSIS — G8929 Other chronic pain: Secondary | ICD-10-CM | POA: Diagnosis not present

## 2023-02-16 DIAGNOSIS — M25511 Pain in right shoulder: Secondary | ICD-10-CM | POA: Diagnosis not present

## 2023-02-19 ENCOUNTER — Other Ambulatory Visit: Payer: Self-pay | Admitting: Student

## 2023-02-19 DIAGNOSIS — G8929 Other chronic pain: Secondary | ICD-10-CM

## 2023-02-19 DIAGNOSIS — M19011 Primary osteoarthritis, right shoulder: Secondary | ICD-10-CM

## 2023-02-21 ENCOUNTER — Encounter: Payer: Self-pay | Admitting: Urology

## 2023-02-21 ENCOUNTER — Ambulatory Visit (INDEPENDENT_AMBULATORY_CARE_PROVIDER_SITE_OTHER): Payer: 59 | Admitting: Urology

## 2023-02-21 VITALS — BP 129/74 | HR 90 | Ht 65.0 in | Wt 126.0 lb

## 2023-02-21 DIAGNOSIS — Z8551 Personal history of malignant neoplasm of bladder: Secondary | ICD-10-CM | POA: Diagnosis not present

## 2023-02-21 DIAGNOSIS — D494 Neoplasm of unspecified behavior of bladder: Secondary | ICD-10-CM

## 2023-02-21 MED ORDER — SULFAMETHOXAZOLE-TRIMETHOPRIM 800-160 MG PO TABS
1.0000 | ORAL_TABLET | Freq: Once | ORAL | Status: AC
Start: 2023-02-21 — End: 2023-02-21
  Administered 2023-02-21: 1 via ORAL

## 2023-02-21 NOTE — Patient Instructions (Signed)
Steps to Quit Smoking Smoking tobacco is the leading cause of preventable death. It can affect almost every organ in the body. Smoking puts you and people around you at risk for many serious, long-lasting (chronic) diseases. Quitting smoking can be hard, but it is one of the best things that you can do for your health. It is never too late to quit. Do not give up if you cannot quit the first time. Some people need to try many times to quit. Do your best to stick to your quit plan, and talk with your doctor if you have any questions or concerns. How do I get ready to quit? Pick a date to quit. Set a date within the next 2 weeks to give you time to prepare. Write down the reasons why you are quitting. Keep this list in places where you will see it often. Tell your family, friends, and co-workers that you are quitting. Their support is important. Talk with your doctor about the choices that may help you quit. Find out if your health insurance will pay for these treatments. Know the people, places, things, and activities that make you want to smoke (triggers). Avoid them. What first steps can I take to quit smoking? Throw away all cigarettes at home, at work, and in your car. Throw away the things that you use when you smoke, such as ashtrays and lighters. Clean your car. Empty the ashtray. Clean your home, including curtains and carpets. What can I do to help me quit smoking? Talk with your doctor about taking medicines and seeing a counselor. You are more likely to succeed when you do both. If you are pregnant or breastfeeding: Talk with your doctor about counseling or other ways to quit smoking. Do not take medicine to help you quit smoking unless your doctor tells you to. Quit right away Quit smoking completely, instead of slowly cutting back on how much you smoke over a period of time. Stopping smoking right away may be more successful than slowly quitting. Go to counseling. In-person is best  if this is an option. You are more likely to quit if you go to counseling sessions regularly. Take medicine You may take medicines to help you quit. Some medicines need a prescription, and some you can buy over-the-counter. Some medicines may contain a drug called nicotine to replace the nicotine in cigarettes. Medicines may: Help you stop having the desire to smoke (cravings). Help to stop the problems that come when you stop smoking (withdrawal symptoms). Your doctor may ask you to use: Nicotine patches, gum, or lozenges. Nicotine inhalers or sprays. Non-nicotine medicine that you take by mouth. Find resources Find resources and other ways to help you quit smoking and remain smoke-free after you quit. They include: Online chats with a counselor. Phone quitlines. Printed self-help materials. Support groups or group counseling. Text messaging programs. Mobile phone apps. Use apps on your mobile phone or tablet that can help you stick to your quit plan. Examples of free services include Quit Guide from the CDC and smokefree.gov  What can I do to make it easier to quit?  Talk to your family and friends. Ask them to support and encourage you. Call a phone quitline, such as 1-800-QUIT-NOW, reach out to support groups, or work with a counselor. Ask people who smoke to not smoke around you. Avoid places that make you want to smoke, such as: Bars. Parties. Smoke-break areas at work. Spend time with people who do not smoke. Lower   the stress in your life. Stress can make you want to smoke. Try these things to lower stress: Getting regular exercise. Doing deep-breathing exercises. Doing yoga. Meditating. What benefits will I see if I quit smoking? Over time, you may have: A better sense of smell and taste. Less coughing and sore throat. A slower heart rate. Lower blood pressure. Clearer skin. Better breathing. Fewer sick days. Summary Quitting smoking can be hard, but it is one of  the best things that you can do for your health. Do not give up if you cannot quit the first time. Some people need to try many times to quit. When you decide to quit smoking, make a plan to help you succeed. Quit smoking right away, not slowly over a period of time. When you start quitting, get help and support to keep you smoke-free. This information is not intended to replace advice given to you by your health care provider. Make sure you discuss any questions you have with your health care provider. Document Revised: 08/26/2021 Document Reviewed: 08/26/2021 Elsevier Patient Education  2024 Elsevier Inc.  

## 2023-02-21 NOTE — Progress Notes (Signed)
Bladder cancer surveillance note  INDICATION Bladder cancer  UROLOGIC HISTORY Kendra [name] is a 77 y.o. female who presented with gross hematuria and was found to have multiple bladder tumors and underwent TURBT on 06/23/2022 with resection of 2 separate papillary bladder tumors, the largest measuring 2.5 cm.  Initial Diagnosis of Bladder  Year: 06/2022 Pathology: HG T1  Recurrent Bladder Cancer Diagnosis None  Treatments for Bladder Cancer 06/23/2022: TURBT and gemcitabine, deferred second look TURBT with her comorbidities and frailty  Induction BCG completed February 2024  No showed multiple visits for initial surveillance cystoscopy  AUA Risk Category High  Cystoscopy Procedure Note:  Bactrim given for prophylaxis  After informed consent and discussion of the procedure and its risks, Kendra [name] was positioned and prepped in the standard fashion. Cystoscopy was performed with the a flexible cystoscope. The urethra, bladder neck and entire bladder was visualized in a standard fashion, and bladder was grossly normal throughout. The ureteral orifices were visualized in their normal location and orientation.  No abnormalities on retroflexion.  No evidence of recurrence Counseled on smoking cessation extensively, continues to smoke a few cigarettes per day She is amenable to continuing BCG, will coordinate BCG to start ASAP.  Ideally complete at least 2 years of BCG Continue cystoscopy every 3 to 4 months for the first 2 years  Legrand Rams, MD 02/21/2023

## 2023-03-05 ENCOUNTER — Ambulatory Visit
Admission: RE | Admit: 2023-03-05 | Discharge: 2023-03-05 | Disposition: A | Discharge status: Home / Self Care | Payer: 59 | Source: Ambulatory Visit | Attending: Student | Admitting: Student

## 2023-03-05 DIAGNOSIS — M25511 Pain in right shoulder: Secondary | ICD-10-CM | POA: Diagnosis not present

## 2023-03-05 DIAGNOSIS — M19011 Primary osteoarthritis, right shoulder: Secondary | ICD-10-CM

## 2023-03-05 DIAGNOSIS — G8929 Other chronic pain: Secondary | ICD-10-CM

## 2023-03-05 DIAGNOSIS — M1990 Unspecified osteoarthritis, unspecified site: Secondary | ICD-10-CM | POA: Diagnosis not present

## 2023-03-06 ENCOUNTER — Ambulatory Visit (INDEPENDENT_AMBULATORY_CARE_PROVIDER_SITE_OTHER): Payer: 59 | Admitting: Physician Assistant

## 2023-03-06 DIAGNOSIS — N390 Urinary tract infection, site not specified: Secondary | ICD-10-CM | POA: Diagnosis not present

## 2023-03-06 DIAGNOSIS — C678 Malignant neoplasm of overlapping sites of bladder: Secondary | ICD-10-CM

## 2023-03-06 DIAGNOSIS — R310 Gross hematuria: Secondary | ICD-10-CM | POA: Diagnosis not present

## 2023-03-06 LAB — MICROSCOPIC EXAMINATION

## 2023-03-06 LAB — URINALYSIS, COMPLETE
Bilirubin, UA: NEGATIVE
Glucose, UA: NEGATIVE
Ketones, UA: NEGATIVE
Nitrite, UA: POSITIVE — AB
Protein,UA: NEGATIVE
RBC, UA: NEGATIVE
Specific Gravity, UA: >1.03 — ABNORMAL HIGH (ref 1.005–1.030)
Urobilinogen, Ur: 0.2 mg/dL (ref 0.2–1.0)
pH, UA: 5.5 (ref 5.0–7.5)

## 2023-03-06 MED ORDER — SULFAMETHOXAZOLE-TRIMETHOPRIM 800-160 MG PO TABS
1.0000 | ORAL_TABLET | Freq: Two times a day (BID) | ORAL | 0 refills | Status: AC
Start: 2023-03-06 — End: 2023-03-11

## 2023-03-06 NOTE — Progress Notes (Signed)
Patient presented to the clinic today for a scheduled BCG instillation.  Urine grossly infected on UA, culture pending.  Patient reports no irritative voiding symptoms.  Will treat for acute UTI today.  Prescription for Bactrim DS BID x5 days sent to pharmacy.  Patient to return to clinic next week for next scheduled BCG treatment.  We will add an additional treatment to her scheduled series to make up for today's missed instillation.  Carman Ching, PA-C 03/06/23 9:29 AM

## 2023-03-08 LAB — CULTURE, URINE COMPREHENSIVE

## 2023-03-09 LAB — CULTURE, URINE COMPREHENSIVE

## 2023-03-13 ENCOUNTER — Ambulatory Visit (INDEPENDENT_AMBULATORY_CARE_PROVIDER_SITE_OTHER): Payer: 59 | Admitting: Physician Assistant

## 2023-03-13 VITALS — BP 112/72 | HR 91

## 2023-03-13 DIAGNOSIS — C678 Malignant neoplasm of overlapping sites of bladder: Secondary | ICD-10-CM | POA: Diagnosis not present

## 2023-03-13 LAB — URINALYSIS, COMPLETE
Bilirubin, UA: NEGATIVE
Glucose, UA: NEGATIVE
Ketones, UA: NEGATIVE
Leukocytes,UA: NEGATIVE
Nitrite, UA: NEGATIVE
Protein,UA: NEGATIVE
RBC, UA: NEGATIVE
Specific Gravity, UA: 1.025 (ref 1.005–1.030)
Urobilinogen, Ur: 1 mg/dL (ref 0.2–1.0)
pH, UA: 5.5 (ref 5.0–7.5)

## 2023-03-13 LAB — MICROSCOPIC EXAMINATION: Epithelial Cells (non renal): >10 /hpf — AB (ref 0–10)

## 2023-03-13 MED ORDER — BCG LIVE 50 MG IS SUSR
3.2400 mL | Freq: Once | INTRAVESICAL | Status: AC
Start: 2023-03-13 — End: 2023-03-13
  Administered 2023-03-13: 81 mg via INTRAVESICAL

## 2023-03-13 NOTE — Progress Notes (Signed)
BCG Bladder Instillation  BCG # 1 of 3  Due to Bladder Cancer patient is present today for a BCG treatment. Patient was cleaned and prepped in a sterile fashion with betadine. A 14FR catheter was inserted, urine return was noted 25ml, urine was yellow in color.  50ml of reconstituted BCG was instilled into the bladder. The catheter was then removed. Patient tolerated well, no complications were noted  Performed by: Carman Ching, PA-C and Humberta Magallon-Mariche, CMA  Follow up/ Additional notes: 1 week for BCG #2

## 2023-03-20 ENCOUNTER — Ambulatory Visit (INDEPENDENT_AMBULATORY_CARE_PROVIDER_SITE_OTHER): Payer: 59 | Admitting: Physician Assistant

## 2023-03-20 DIAGNOSIS — C679 Malignant neoplasm of bladder, unspecified: Secondary | ICD-10-CM

## 2023-03-20 DIAGNOSIS — D494 Neoplasm of unspecified behavior of bladder: Secondary | ICD-10-CM

## 2023-03-20 LAB — MICROSCOPIC EXAMINATION: Epithelial Cells (non renal): >10 /hpf — AB (ref 0–10)

## 2023-03-20 LAB — URINALYSIS, COMPLETE
Bilirubin, UA: NEGATIVE
Glucose, UA: NEGATIVE
Ketones, UA: NEGATIVE
Leukocytes,UA: NEGATIVE
Nitrite, UA: NEGATIVE
RBC, UA: NEGATIVE
Specific Gravity, UA: >1.03 — ABNORMAL HIGH (ref 1.005–1.030)
Urobilinogen, Ur: 1 mg/dL (ref 0.2–1.0)
pH, UA: 5 (ref 5.0–7.5)

## 2023-03-20 MED ORDER — BCG LIVE 50 MG IS SUSR
3.2400 mL | Freq: Once | INTRAVESICAL | Status: AC
Start: 2023-03-20 — End: 2023-03-20
  Administered 2023-03-20: 81 mg via INTRAVESICAL

## 2023-03-20 NOTE — Progress Notes (Signed)
BCG Bladder Instillation  BCG # 3  Due to Bladder Cancer patient is present today for a BCG treatment. Patient was cleaned and prepped in a sterile fashion with betadine. A 14 FR catheter was inserted, urine return was noted 0 ml, urine was nothing in color.  50ml of reconstituted BCG was instilled into the bladder. The catheter was then removed. Patient tolerated well, no complications were noted  Performed by: Maryland Pink RMA  Follow up/ Additional notes: F/UP as scheduled

## 2023-03-20 NOTE — Progress Notes (Signed)
BCG Bladder Instillation  BCG # 2 of 3  Due to Bladder Cancer patient is present today for a BCG treatment. Patient was cleaned and prepped in a sterile fashion with betadine. A 14FR catheter was inserted, urine return was noted 0ml.  50ml of reconstituted BCG was instilled into the bladder. The catheter was then removed. Patient tolerated well, no complications were noted.  Performed by: Carman Ching, PA-C and [place] Pink, CMA  Follow up/ Additional notes: 1 week for BCG #3 of 3

## 2023-03-26 DIAGNOSIS — M7581 Other shoulder lesions, right shoulder: Secondary | ICD-10-CM | POA: Diagnosis not present

## 2023-03-26 DIAGNOSIS — M19011 Primary osteoarthritis, right shoulder: Secondary | ICD-10-CM | POA: Diagnosis not present

## 2023-03-26 DIAGNOSIS — E1142 Type 2 diabetes mellitus with diabetic polyneuropathy: Secondary | ICD-10-CM | POA: Diagnosis not present

## 2023-03-26 DIAGNOSIS — M542 Cervicalgia: Secondary | ICD-10-CM | POA: Diagnosis not present

## 2023-03-26 DIAGNOSIS — S46101A Unspecified injury of muscle, fascia and tendon of long head of biceps, right arm, initial encounter: Secondary | ICD-10-CM | POA: Diagnosis not present

## 2023-03-26 DIAGNOSIS — M47812 Spondylosis without myelopathy or radiculopathy, cervical region: Secondary | ICD-10-CM | POA: Diagnosis not present

## 2023-03-27 ENCOUNTER — Ambulatory Visit: Payer: 59 | Admitting: Physician Assistant

## 2023-03-28 ENCOUNTER — Other Ambulatory Visit: Payer: Self-pay | Admitting: Internal Medicine

## 2023-03-28 DIAGNOSIS — G4700 Insomnia, unspecified: Secondary | ICD-10-CM

## 2023-03-29 ENCOUNTER — Ambulatory Visit (INDEPENDENT_AMBULATORY_CARE_PROVIDER_SITE_OTHER): Payer: 59 | Admitting: Physician Assistant

## 2023-03-29 ENCOUNTER — Encounter: Payer: Self-pay | Admitting: Physician Assistant

## 2023-03-29 ENCOUNTER — Ambulatory Visit: Payer: 59 | Admitting: Physician Assistant

## 2023-03-29 DIAGNOSIS — C678 Malignant neoplasm of overlapping sites of bladder: Secondary | ICD-10-CM

## 2023-03-29 LAB — URINALYSIS, COMPLETE
Bilirubin, UA: NEGATIVE
Glucose, UA: NEGATIVE
Ketones, UA: NEGATIVE
Leukocytes,UA: NEGATIVE
Nitrite, UA: NEGATIVE
Protein,UA: NEGATIVE
RBC, UA: NEGATIVE
Specific Gravity, UA: 1.015 (ref 1.005–1.030)
Urobilinogen, Ur: 0.2 mg/dL (ref 0.2–1.0)
pH, UA: 5.5 (ref 5.0–7.5)

## 2023-03-29 LAB — MICROSCOPIC EXAMINATION: Epithelial Cells (non renal): >10 /hpf — AB (ref 0–10)

## 2023-03-29 MED ORDER — BCG LIVE 50 MG IS SUSR
3.2400 mL | Freq: Once | INTRAVESICAL | Status: AC
Start: 2023-03-29 — End: 2023-03-29
  Administered 2023-03-29: 81 mg via INTRAVESICAL

## 2023-03-29 NOTE — Telephone Encounter (Signed)
Requested Prescriptions  Pending Prescriptions Disp Refills   traZODone (DESYREL) 50 MG tablet [Pharmacy Med Name: TRAZODONE HCL 50 MG TAB] 90 tablet 0    Sig: TAKE 1 TABLET BY MOUTH AT BEDTIME     Psychiatry: Antidepressants - Serotonin Modulator Passed - 03/28/2023  3:03 PM      Passed - Completed PHQ-2 or PHQ-9 in the last 360 days      Passed - Valid encounter within last 6 months    Recent Outpatient Visits           2 months ago Chronic right shoulder pain   [facility] [place], [place] W, NP   2 months ago Controlled type 2 diabetes mellitus with complication, without long-term current use of insulin [facility])   [place] [name] [place], [name], NP   1 year ago Nonspecific chest pain   [place] [facility] [place], [name], NP   1 year ago Chest pain, unspecified type   [place] [facility] [place], [place], NP   1 year ago Controlled type 2 diabetes mellitus with complication, without long-term current use of insulin [facility])   [place] [facility] [place], [name], NP       Future Appointments             Today Carman Ching, PA-C [facility] [place]   In 5 days [place], [name], NP [name], [facility]

## 2023-03-29 NOTE — Progress Notes (Signed)
BCG Bladder Instillation  BCG # 3 of 3  Due to Bladder Cancer patient is present today for a BCG treatment. Patient was cleaned and prepped in a sterile fashion with betadine. A 14FR catheter was inserted, urine return was noted 10ml, urine was yellow in color.  50ml of reconstituted BCG was instilled into the bladder. The catheter was then removed. Patient tolerated well, no complications were noted  Performed by: Carman Ching, PA-C and [place] Pink, RMA  Follow up/ Additional notes: Return in about 3 months (around 06/29/2023) for Cysto with Dr. Richardo Hanks.

## 2023-04-03 ENCOUNTER — Encounter: Payer: Self-pay | Admitting: Internal Medicine

## 2023-04-03 ENCOUNTER — Other Ambulatory Visit: Payer: Self-pay | Admitting: Surgery

## 2023-04-03 ENCOUNTER — Ambulatory Visit (INDEPENDENT_AMBULATORY_CARE_PROVIDER_SITE_OTHER): Payer: 59 | Admitting: Internal Medicine

## 2023-04-03 VITALS — BP 128/72 | HR 83 | Temp 96.4°F | Wt 127.0 lb

## 2023-04-03 DIAGNOSIS — E1169 Type 2 diabetes mellitus with other specified complication: Secondary | ICD-10-CM

## 2023-04-03 DIAGNOSIS — Z1211 Encounter for screening for malignant neoplasm of colon: Secondary | ICD-10-CM | POA: Diagnosis not present

## 2023-04-03 DIAGNOSIS — Z0001 Encounter for general adult medical examination with abnormal findings: Secondary | ICD-10-CM

## 2023-04-03 DIAGNOSIS — E785 Hyperlipidemia, unspecified: Secondary | ICD-10-CM

## 2023-04-03 DIAGNOSIS — E118 Type 2 diabetes mellitus with unspecified complications: Secondary | ICD-10-CM | POA: Diagnosis not present

## 2023-04-03 LAB — CBC
MCH: 30.8 pg (ref 27.0–33.0)
MCV: 92 fL (ref 80.0–100.0)

## 2023-04-03 NOTE — Patient Instructions (Signed)
Health Maintenance for Postmenopausal Women Menopause is a normal process in which your ability to get pregnant comes to an end. This process happens slowly over many months or years, usually between the ages of 48 and 55. Menopause is complete when you have missed your menstrual period for 12 months. It is important to talk with your health care provider about some of the most common conditions that affect women after menopause (postmenopausal women). These include heart disease, cancer, and bone loss (osteoporosis). Adopting a healthy lifestyle and getting preventive care can help to promote your health and wellness. The actions you take can also lower your chances of developing some of these common conditions. What are the signs and symptoms of menopause? During menopause, you may have the following symptoms: Hot flashes. These can be moderate or severe. Night sweats. Decrease in sex drive. Mood swings. Headaches. Tiredness (fatigue). Irritability. Memory problems. Problems falling asleep or staying asleep. Talk with your health care provider about treatment options for your symptoms. Do I need hormone replacement therapy? Hormone replacement therapy is effective in treating symptoms that are caused by menopause, such as hot flashes and night sweats. Hormone replacement carries certain risks, especially as you become older. If you are thinking about using estrogen or estrogen with progestin, discuss the benefits and risks with your health care provider. How can I reduce my risk for heart disease and stroke? The risk of heart disease, heart attack, and stroke increases as you age. One of the causes may be a change in the body's hormones during menopause. This can affect how your body uses dietary fats, triglycerides, and cholesterol. Heart attack and stroke are medical emergencies. There are many things that you can do to help prevent heart disease and stroke. Watch your blood pressure High  blood pressure causes heart disease and increases the risk of stroke. This is more likely to develop in people who have high blood pressure readings or are overweight. Have your blood pressure checked: Every 3-5 years if you are 18-39 years of age. Every year if you are 40 years old or older. Eat a healthy diet  Eat a diet that includes plenty of vegetables, fruits, low-fat dairy products, and lean protein. Do not eat a lot of foods that are high in solid fats, added sugars, or sodium. Get regular exercise Get regular exercise. This is one of the most important things you can do for your health. Most adults should: Try to exercise for at least 150 minutes each week. The exercise should increase your heart rate and make you sweat (moderate-intensity exercise). Try to do strengthening exercises at least twice each week. Do these in addition to the moderate-intensity exercise. Spend less time sitting. Even light physical activity can be beneficial. Other tips Work with your health care provider to achieve or maintain a healthy weight. Do not use any products that contain nicotine or tobacco. These products include cigarettes, chewing tobacco, and vaping devices, such as e-cigarettes. If you need help quitting, ask your health care provider. Know your numbers. Ask your health care provider to check your cholesterol and your blood sugar (glucose). Continue to have your blood tested as directed by your health care provider. Do I need screening for cancer? Depending on your health history and family history, you may need to have cancer screenings at different stages of your life. This may include screening for: Breast cancer. Cervical cancer. Lung cancer. Colorectal cancer. What is my risk for osteoporosis? After menopause, you may be   at increased risk for osteoporosis. Osteoporosis is a condition in which bone destruction happens more quickly than new bone creation. To help prevent osteoporosis or  the bone fractures that can happen because of osteoporosis, you may take the following actions: If you are 19-50 years old, get at least 1,000 mg of calcium and at least 600 international units (IU) of vitamin D per day. If you are older than age 50 but younger than age 70, get at least 1,200 mg of calcium and at least 600 international units (IU) of vitamin D per day. If you are older than age 70, get at least 1,200 mg of calcium and at least 800 international units (IU) of vitamin D per day. Smoking and drinking excessive alcohol increase the risk of osteoporosis. Eat foods that are rich in calcium and vitamin D, and do weight-bearing exercises several times each week as directed by your health care provider. How does menopause affect my mental health? Depression may occur at any age, but it is more common as you become older. Common symptoms of depression include: Feeling depressed. Changes in sleep patterns. Changes in appetite or eating patterns. Feeling an overall lack of motivation or enjoyment of activities that you previously enjoyed. Frequent crying spells. Talk with your health care provider if you think that you are experiencing any of these symptoms. General instructions See your health care provider for regular wellness exams and vaccines. This may include: Scheduling regular health, dental, and eye exams. Getting and maintaining your vaccines. These include: Influenza vaccine. Get this vaccine each year before the flu season begins. Pneumonia vaccine. Shingles vaccine. Tetanus, diphtheria, and pertussis (Tdap) booster vaccine. Your health care provider may also recommend other immunizations. Tell your health care provider if you have ever been abused or do not feel safe at home. Summary Menopause is a normal process in which your ability to get pregnant comes to an end. This condition causes hot flashes, night sweats, decreased interest in sex, mood swings, headaches, or lack  of sleep. Treatment for this condition may include hormone replacement therapy. Take actions to keep yourself healthy, including exercising regularly, eating a healthy diet, watching your weight, and checking your blood pressure and blood sugar levels. Get screened for cancer and depression. Make sure that you are up to date with all your vaccines. This information is not intended to replace advice given to you by your health care provider. Make sure you discuss any questions you have with your health care provider. Document Revised: 01/24/2021 Document Reviewed: 01/24/2021 Elsevier Patient Education  2024 Elsevier Inc.  

## 2023-04-03 NOTE — Progress Notes (Unsigned)
Subjective:    Patient ID: Kendra [name], female    DOB: 29-Aug-1946, 77 y.o.   MRN: [id number]  HPI  Patient presents to clinic today for her annual exam.  Flu: 08/2022 Tetanus: 07/2010 COVID: X 2 Pneumovax: 07/2018 Prevnar: 10/2014 Zostavax: 05/2013 Shingrix: Never Pap smear: 10/2014 Mammogram: 09/2020 Bone density: 07/2010 Colon screening: 01/2006 Vision screening: as needed Dentist: as needed  Diet: She does eat meat. She consumes fruits and veggies. She tries to avoid fried foods. She drinks mostly water, coffee. Exercise: None   Review of Systems     Past Medical History:  Diagnosis Date   Anemia    Aortic atherosclerosis (HCC)    Asthma    Back pain    Bladder tumor    Carotid artery stenosis    Cataract    Cervical disc disorder    COPD (chronic obstructive pulmonary disease) (HCC)    Depression    Diabetes (HCC)    type II   Diabetic neuropathy (HCC)    Dyspnea    with exertion   GERD (gastroesophageal reflux disease)    Hematuria    History of kidney stones    per patient "can't remember the year"   Hyperlipidemia    Hypertension    per patient "take meds for it"   Insomnia    Neuropathy    Osteoarthritis    Osteopenia    Pneumonia 2018   per patient   Reflux    Rheumatoid arthritis (HCC)    Stroke (HCC) 2015   and again 2017  - Weakness in left leg, staggers w/ walking, vision    Tenosynovitis     Current Outpatient Medications  Medication Sig Dispense Refill   Acetaminophen (TYLENOL 8 HOUR ARTHRITIS PAIN PO) Take 2 capsules by mouth in the morning, at noon, in the evening, and at bedtime.     albuterol (VENTOLIN HFA) 108 (90 Base) MCG/ACT inhaler Inhale 1-2 puffs into the lungs every 6 (six) hours as needed. INHALE 1-2 PUFFS BY MOUTH EVERY 6 HOURS AS NEEDED WHEEZING/ SHORTNESS OF BREATH 18 g 1   aspirin EC 81 MG tablet Take 1 tablet (81 mg total) by mouth daily. Swallow whole. 90 tablet 1   atorvastatin (LIPITOR) 40 MG tablet TAKE 1  TABLET BY MOUTH BEDTIME 90 tablet 1   buPROPion (WELLBUTRIN XL) 150 MG 24 hr tablet TAKE 1 TABLET BY MOUTH ONCE DAILY ** NEED TO KEEP UPCOMING APPOINTMENT!!1 90 tablet 0   Fluticasone-Umeclidin-Vilant (TRELEGY ELLIPTA) 100-62.5-25 MCG/ACT AEPB Inhale 1 puff into the lungs at bedtime. 60 each 0   methocarbamol (ROBAXIN) 500 MG tablet Take 1 tablet (500 mg total) by mouth every 8 (eight) hours as needed for muscle spasms. 30 tablet 0   silver sulfADIAZINE (SILVADENE) 1 % cream Apply 1 Application topically daily. 50 g 0   traZODone (DESYREL) 50 MG tablet TAKE 1 TABLET BY MOUTH AT BEDTIME 90 tablet 0   No current facility-administered medications for this visit.    Allergies  Allergen Reactions   Citalopram Nausea And Vomiting    Family History  Problem Relation Age of Onset   Stroke Father    Depression Father    Diabetes Mother    Hyperlipidemia Mother    Breast cancer Mother    Heart murmur Son    Heart murmur Son    Heart murmur Son     Social History   Socioeconomic History   Marital status: Widowed    Spouse name:  Not on file   Number of children: 3   Years of education: 11   Highest education level: Not on file  Occupational History   Occupation: Disabled  Tobacco Use   Smoking status: Every Day    Current packs/day: 0.25    Average packs/day: 0.3 packs/day for 61.0 years (15.3 ttl pk-yrs)    Types: Cigarettes    Passive exposure: Current   Smokeless tobacco: Former    Types: Snuff  Vaping Use   Vaping status: Never Used  Substance and Sexual Activity   Alcohol use: No   Drug use: No   Sexual activity: Never  Other Topics Concern   Not on file  Social History Narrative   Not on file   Social Determinants of Health   Financial Resource Strain: Medium Risk (01/04/2022)   Overall Financial Resource Strain (CARDIA)    Difficulty of Paying Living Expenses: Somewhat hard  Food Insecurity: No Food Insecurity (05/08/2022)   Hunger Vital Sign    Worried About  Running Out of Food in the Last Year: Never true    Ran Out of Food in the Last Year: Never true  Transportation Needs: Unmet Transportation Needs (08/09/2022)   PRAPARE - Transportation    Lack of Transportation (Medical): Yes    Lack of Transportation (Non-Medical): Yes  Physical Activity: Inactive (11/14/2021)   Exercise Vital Sign    Days of Exercise per Week: 0 days    Minutes of Exercise per Session: 0 min  Stress: Stress Concern Present (01/04/2022)   [employer] of Occupational Health - Occupational Stress Questionnaire    Feeling of Stress : Rather much  Social Connections: Socially Isolated (11/14/2021)   Social Connection and Isolation Panel [NHANES]    Frequency of Communication with Friends and Family: More than three times a week    Frequency of Social Gatherings with Friends and Family: More than three times a week    Attends Religious Services: Never    Database administrator or Organizations: No    Attends Banker Meetings: Never    Marital Status: Widowed  Intimate Partner Violence: Not At Risk (05/08/2022)   Humiliation, Afraid, Rape, and Kick questionnaire    Fear of Current or Ex-Partner: No    Emotionally Abused: No    Physically Abused: No    Sexually Abused: No     Constitutional: Denies fever, malaise, fatigue, headache or abrupt weight changes.  HEENT: Denies eye pain, eye redness, ear pain, ringing in the ears, wax buildup, runny nose, nasal congestion, bloody nose, or sore throat. Respiratory: Denies difficulty breathing, shortness of breath, cough or sputum production.   Cardiovascular: Denies chest pain, chest tightness, palpitations or swelling in the hands or feet.  Gastrointestinal: Denies abdominal pain, bloating, constipation, diarrhea or blood in the stool.  GU: Denies urgency, frequency, pain with urination, burning sensation, blood in urine, odor or discharge. Musculoskeletal: Patient reports chronic joint pain, left-sided  weakness.  Denies decrease in range of motion, difficulty with gait, muscle pain or joint swelling.  Skin: Denies redness, rashes, lesions or ulcercations.  Neurological: Patient reports insomnia.  Denies dizziness, difficulty with memory, difficulty with speech or problems with balance and coordination.  Psych: Patient has a history of depression.  Denies anxiety, SI/HI.  No other specific complaints in a complete review of systems (except as listed in HPI above).  Objective:   Physical Exam  ,BP 128/72 (BP Location: Left Arm, Patient Position: Sitting, Cuff Size: Normal)  Pulse 83   Temp (!) 96.4 F (35.8 C) (Temporal)   Wt 127 lb (57.6 kg)   SpO2 97%   BMI 21.13 kg/m   Wt Readings from Last 3 Encounters:  02/21/23 126 lb (57.2 kg)  01/25/23 120 lb (54.4 kg)  01/02/23 122 lb (55.3 kg)    General: Appears her stated age, chronically ill appearing, in NAD. Skin: Warm, dry and intact. No ulcerations noted. HEENT: Head: normal shape and size; Eyes: sclera white, no icterus, conjunctiva pink, PERRLA and EOMs intact;  Neck:  Neck supple, trachea midline. No masses, lumps or thyromegaly present.  Cardiovascular: Normal rate and rhythm. S1,S2 noted.  No murmur, rubs or gallops noted. No JVD or BLE edema. No carotid bruits noted. Pulmonary/Chest: Normal effort and coarse breath sounds. No respiratory distress. No wheezes, rales or ronchi noted.  Abdomen: Soft and nontender. Normal bowel sounds.  Musculoskeletal: Gait slow and steady with cane. Neurological: Alert and oriented. Cranial nerves II-XII grossly intact. Coordination normal.  Psychiatric: Mood and affect normal. Behavior is normal. Judgment and thought content normal.    BMET    Component Value Date/Time   NA 139 01/02/2023 1332   NA 141 01/18/2016 1150   NA 138 09/01/2014 2130   K 4.1 01/02/2023 1332   K 3.5 09/01/2014 2130   CL 104 01/02/2023 1332   CL 104 09/01/2014 2130   CO2 28 01/02/2023 1332   CO2 28  09/01/2014 2130   GLUCOSE 174 (H) 01/02/2023 1332   GLUCOSE 287 (H) 09/01/2014 2130   BUN 20 01/02/2023 1332   BUN 12 01/18/2016 1150   BUN 14 09/01/2014 2130   CREATININE 0.88 01/02/2023 1332   CALCIUM 9.8 01/02/2023 1332   CALCIUM 8.9 09/01/2014 2130   GFRNONAA >60 08/22/2022 1209   GFRNONAA 70 06/14/2020 0929   GFRAA 82 06/14/2020 0929    Lipid Panel     Component Value Date/Time   CHOL 130 01/02/2023 1332   CHOL 177 06/07/2015 1215   CHOL 141 09/02/2014 0440   TRIG 98 01/02/2023 1332   TRIG 155 09/02/2014 0440   HDL 70 01/02/2023 1332   HDL 56 06/07/2015 1215   HDL 37 (L) 09/02/2014 0440   CHOLHDL 1.9 01/02/2023 1332   VLDL 16 10/17/2019 0606   VLDL 31 09/02/2014 0440   LDLCALC 42 01/02/2023 1332   LDLCALC 73 09/02/2014 0440    CBC    Component Value Date/Time   WBC 5.3 01/02/2023 1332   RBC 3.62 (L) 01/02/2023 1332   HGB 10.4 (L) 01/02/2023 1332   HGB 12.7 09/01/2014 2130   HCT 32.1 (L) 01/02/2023 1332   HCT 37.9 09/01/2014 2130   PLT 283 01/02/2023 1332   PLT 260 09/05/2014 0517   MCV 88.7 01/02/2023 1332   MCV 92 09/01/2014 2130   MCH 28.7 01/02/2023 1332   MCHC 32.4 01/02/2023 1332   RDW 16.4 (H) 01/02/2023 1332   RDW 13.9 09/01/2014 2130   LYMPHSABS 1.7 08/22/2022 1209   MONOABS 0.3 08/22/2022 1209   EOSABS 0.1 08/22/2022 1209   BASOSABS 0.0 08/22/2022 1209    Hgb A1C Lab Results  Component Value Date   HGBA1C 7.0 (H) 01/02/2023           Assessment & Plan:   Preventative health maintenance:  Encouraged her to get a flu shot in the fall She declines tetanus for financial reasons, advised her if she gets bit or cut to go get this done Pneumovax and Prevnar UTD  Encouraged her to get her COVID booster Zostavax UTD Discussed Shingrix vaccine, she will check coverage with her insurance company and schedule visit if she would like to have this done She no longer wants to screen for cervical cancer She declines mammogram and bone density  at this time Referral to GI for screening colonoscopy Encouraged her to consume a balanced diet and exercise regimen Advised her to see an eye doctor and dentist annually We will check CBC, c-Met, lipid and A1c today  RTC in 6 months, follow-up chronic conditions Nicki Reaper, NP

## 2023-04-04 ENCOUNTER — Other Ambulatory Visit: Payer: Self-pay

## 2023-04-04 ENCOUNTER — Encounter: Payer: Self-pay | Admitting: Internal Medicine

## 2023-04-04 ENCOUNTER — Encounter
Admission: RE | Admit: 2023-04-04 | Discharge: 2023-04-04 | Disposition: A | Discharge status: Home / Self Care | Payer: 59 | Source: Ambulatory Visit | Attending: Surgery | Admitting: Surgery

## 2023-04-04 VITALS — BP 141/89 | HR 86 | Resp 16

## 2023-04-04 DIAGNOSIS — Z01818 Encounter for other preprocedural examination: Secondary | ICD-10-CM | POA: Insufficient documentation

## 2023-04-04 DIAGNOSIS — E118 Type 2 diabetes mellitus with unspecified complications: Secondary | ICD-10-CM | POA: Insufficient documentation

## 2023-04-04 DIAGNOSIS — R9431 Abnormal electrocardiogram [ECG] [EKG]: Secondary | ICD-10-CM | POA: Insufficient documentation

## 2023-04-04 DIAGNOSIS — Z01810 Encounter for preprocedural cardiovascular examination: Secondary | ICD-10-CM | POA: Diagnosis not present

## 2023-04-04 DIAGNOSIS — Z01812 Encounter for preprocedural laboratory examination: Secondary | ICD-10-CM

## 2023-04-04 HISTORY — DX: Malignant (primary) neoplasm, unspecified: C80.1

## 2023-04-04 LAB — HEMOGLOBIN A1C
Hgb A1c MFr Bld: 6.8 % of total Hgb — ABNORMAL HIGH (ref <5.7)
Mean Plasma Glucose: 148 mg/dL
eAG (mmol/L): 8.2 mmol/L

## 2023-04-04 LAB — LIPID PANEL
Cholesterol: 112 mg/dL (ref <200)
HDL: 65 mg/dL (ref >=50)
LDL Cholesterol (Calc): 29 mg/dL (calc)
Non-HDL Cholesterol (Calc): 47 mg/dL (calc) (ref <130)
Total CHOL/HDL Ratio: 1.7 (calc) (ref <5.0)
Triglycerides: 92 mg/dL (ref <150)

## 2023-04-04 LAB — COMPLETE METABOLIC PANEL WITH GFR
AG Ratio: 1.7 (calc) (ref 1.0–2.5)
ALT: 14 U/L (ref 6–29)
AST: 14 U/L (ref 10–35)
Albumin: 4.4 g/dL (ref 3.6–5.1)
Alkaline phosphatase (APISO): 90 U/L (ref 37–153)
BUN: 13 mg/dL (ref 7–25)
CO2: 26 mmol/L (ref 20–32)
Calcium: 9.7 mg/dL (ref 8.6–10.4)
Chloride: 106 mmol/L (ref 98–110)
Creat: 0.9 mg/dL (ref 0.60–1.00)
Globulin: 2.6 g/dL (calc) (ref 1.9–3.7)
Glucose, Bld: 141 mg/dL — ABNORMAL HIGH (ref 65–139)
Potassium: 4.2 mmol/L (ref 3.5–5.3)
Sodium: 142 mmol/L (ref 135–146)
Total Bilirubin: 0.3 mg/dL (ref 0.2–1.2)
Total Protein: 7 g/dL (ref 6.1–8.1)
eGFR: 67 mL/min/{1.73_m2} (ref >=60)

## 2023-04-04 LAB — CBC
HCT: 33.5 % — ABNORMAL LOW (ref 35.0–45.0)
Hemoglobin: 11.2 g/dL — ABNORMAL LOW (ref 11.7–15.5)
MCHC: 33.4 g/dL (ref 32.0–36.0)
MPV: 8.8 fL (ref 7.5–12.5)
Platelets: 236 10*3/uL (ref 140–400)
RBC: 3.64 10*6/uL — ABNORMAL LOW (ref 3.80–5.10)
RDW: 13.4 % (ref 11.0–15.0)
WBC: 5.1 10*3/uL (ref 3.8–10.8)

## 2023-04-04 LAB — TYPE AND SCREEN
ABO/RH(D): O POS
Antibody Screen: NEGATIVE

## 2023-04-04 LAB — SURGICAL PCR SCREEN
MRSA, PCR: NEGATIVE
Staphylococcus aureus: NEGATIVE

## 2023-04-04 NOTE — Patient Instructions (Addendum)
Your procedure is scheduled on:04/10/23 - Tuesday Report to the Registration Desk on the 1st floor of the [facility]. To find out your arrival time, please call [phone] between 1PM - 3PM on: 04/09/23 - Monday If your arrival time is 6:00 am, do not arrive before that time as the [facility] entrance doors do not open until 6:00 am.  REMEMBER: Instructions that are not followed completely may result in serious medical risk, up to and including death; or upon the discretion of your surgeon and anesthesiologist your surgery may need to be rescheduled.  Do not eat food after midnight the night before surgery.  No gum chewing or hard candies.  You may however, drink CLEAR liquids up to 2 hours before you are scheduled to arrive for your surgery. Do not drink anything within 2 hours of your scheduled arrival time.  . In addition, your doctor has ordered for you to drink the provided:   Gatorade G2 Drinking this carbohydrate drink up to two hours before surgery helps to reduce insulin resistance and improve patient outcomes. Please complete drinking 2 hours before scheduled arrival time.  One week prior to surgery: Stop Anti-inflammatories (NSAIDS) such as Advil, Aleve, Ibuprofen, Motrin, Naproxen, Naprosyn and Aspirin based products such as Excedrin, Goody's Powder, BC Powder.  Stop ANY OVER THE COUNTER supplements until after surgery : calcium  You may however, continue to take Tylenol if needed for pain up until the day of surgery.   TAKE ONLY THESE MEDICATIONS THE MORNING OF SURGERY WITH A SIP OF WATER:  NONE   Use inhalers on the day of surgery and bring to the hospital.   No Alcohol for 24 hours before or after surgery.  No Smoking including e-cigarettes for 24 hours before surgery.  No chewable tobacco products for at least 6 hours before surgery.  No nicotine patches on the day of surgery.  Do not use any "recreational" drugs for at least a week (preferably 2 weeks)  before your surgery.  Please be advised that the combination of cocaine and anesthesia may have negative outcomes, up to and including death. If you test positive for cocaine, your surgery will be cancelled.  On the morning of surgery brush your teeth with toothpaste and water, you may rinse your mouth with mouthwash if you wish. Do not swallow any toothpaste or mouthwash.  Use CHG Soap or wipes as directed on instruction sheet.  Do not wear jewelry, make-up, hairpins, clips or nail polish.  Do not wear lotions, powders, or perfumes.   Contact lenses, hearing aids and dentures may not be worn into surgery.  Do not bring valuables to the hospital. [facility] is not responsible for any missing/lost belongings or valuables.   Total Shoulder Arthroplasty:  use Benzoyl Peroxide 5% Gel as directed on instruction sheet.  Notify your doctor if there is any change in your medical condition (cold, fever, infection).  Wear comfortable clothing (specific to your surgery type) to the hospital.  After surgery, you can help prevent lung complications by doing breathing exercises.  Take deep breaths and cough every 1-2 hours. Your doctor may order a device called an Incentive Spirometer to help you take deep breaths. When coughing or sneezing, hold a pillow firmly against your incision with both hands. This is called "splinting." Doing this helps protect your incision. It also decreases belly discomfort.  If you are being admitted to the hospital overnight, leave your suitcase in the car. After surgery it may be  brought to your room.  In case of increased patient census, it may be necessary for you, the patient, to continue your postoperative care in the Same Day Surgery department.  If you are being discharged the day of surgery, you will not be allowed to drive home. You will need a responsible individual to drive you home and stay with you for 24 hours after surgery.   If you are taking public  transportation, you will need to have a responsible individual with you.  Please call the Pre-admissions Testing Dept. at [phone] if you have any questions about these instructions.  Surgery Visitation Policy:  Patients having surgery or a procedure may have two visitors.  Children under the age of 65 must have an adult with them who is not the patient.  Inpatient Visitation:    Visiting hours are 7 a.m. to 8 p.m. Up to four visitors are allowed at one time in a patient room. The visitors may rotate out with other people during the day.  One visitor age 75 or older may stay with the patient overnight and must be in the room by 8 p.m.   Pre-operative 5 CHG Bath Instructions   You can play a key role in reducing the risk of infection after surgery. Your skin needs to be as free of germs as possible. You can reduce the number of germs on your skin by washing with CHG (chlorhexidine gluconate) soap before surgery. CHG is an antiseptic soap that kills germs and continues to kill germs even after washing.   DO NOT use if you have an allergy to chlorhexidine/CHG or antibacterial soaps. If your skin becomes reddened or irritated, stop using the CHG and notify one of our RNs at [phone].   Please shower with the CHG soap starting 4 days before surgery using the following schedule: 04/06/23 - 04/10/23.    Please keep in mind the following:  DO NOT shave, including legs and underarms, starting the day of your first shower.   You may shave your face at any point before/day of surgery.  Place clean sheets on your bed the day you start using CHG soap. Use a clean washcloth (not used since being washed) for each shower. DO NOT sleep with pets once you start using the CHG.   CHG Shower Instructions:  If you choose to wash your hair and private area, wash first with your normal shampoo/soap.  After you use shampoo/soap, rinse your hair and body thoroughly to remove shampoo/soap residue.   Turn the water OFF and apply about 3 tablespoons (45 ml) of CHG soap to a CLEAN washcloth.  Apply CHG soap ONLY FROM YOUR NECK DOWN TO YOUR TOES (washing for 3-5 minutes)  DO NOT use CHG soap on face, private areas, open wounds, or sores.  Pay special attention to the area where your surgery is being performed.  If you are having back surgery, having someone wash your back for you may be helpful. Wait 2 minutes after CHG soap is applied, then you may rinse off the CHG soap.  Pat dry with a clean towel  Put on clean clothes/pajamas   If you choose to wear lotion, please use ONLY the CHG-compatible lotions on the back of this paper.     Additional instructions for the day of surgery: DO NOT APPLY any lotions, deodorants, cologne, or perfumes.   Put on clean/comfortable clothes.  Brush your teeth.  Ask your nurse before applying any prescription medications to the  skin.      CHG Compatible Lotions   Aveeno Moisturizing lotion  Cetaphil Moisturizing Cream  Cetaphil Moisturizing Lotion  Clairol Herbal Essence Moisturizing Lotion, Dry Skin  Clairol Herbal Essence Moisturizing Lotion, Extra Dry Skin  Clairol Herbal Essence Moisturizing Lotion, Normal Skin  Curel Age Defying Therapeutic Moisturizing Lotion with Alpha Hydroxy  Curel Extreme Care Body Lotion  Curel Soothing Hands Moisturizing Hand Lotion  Curel Therapeutic Moisturizing Cream, Fragrance-Free  Curel Therapeutic Moisturizing Lotion, Fragrance-Free  Curel Therapeutic Moisturizing Lotion, Original Formula  Eucerin Daily Replenishing Lotion  Eucerin Dry Skin Therapy Plus Alpha Hydroxy Crme  Eucerin Dry Skin Therapy Plus Alpha Hydroxy Lotion  Eucerin Original Crme  Eucerin Original Lotion  Eucerin Plus Crme Eucerin Plus Lotion  Eucerin TriLipid Replenishing Lotion  Keri Anti-Bacterial Hand Lotion  Keri Deep Conditioning Original Lotion Dry Skin Formula Softly Scented  Keri Deep Conditioning Original Lotion, Fragrance  Free Sensitive Skin Formula  Keri Lotion Fast Absorbing Fragrance Free Sensitive Skin Formula  Keri Lotion Fast Absorbing Softly Scented Dry Skin Formula  Keri Original Lotion  Keri Skin Renewal Lotion Keri Silky Smooth Lotion  Keri Silky Smooth Sensitive Skin Lotion  Nivea Body Creamy Conditioning Oil  Nivea Body Extra Enriched Lotion  Nivea Body Original Lotion  Nivea Body Sheer Moisturizing Lotion Nivea Crme  Nivea Skin Firming Lotion  NutraDerm 30 Skin Lotion  NutraDerm Skin Lotion  NutraDerm Therapeutic Skin Cream  NutraDerm Therapeutic Skin Lotion  ProShield Protective Hand Cream  Provon moisturizing lotion  How to Use an Incentive Spirometer  An incentive spirometer is a tool that measures how well you are filling your lungs with each breath. Learning to take long, deep breaths using this tool can help you keep your lungs clear and active. This may help to reverse or lessen your chance of developing breathing (pulmonary) problems, especially infection. You may be asked to use a spirometer: After a surgery. If you have a lung problem or a history of smoking. After a long period of time when you have been unable to move or be active. If the spirometer includes an indicator to show the highest number that you have reached, your health care provider or respiratory therapist will help you set a goal. Keep a log of your progress as told by your health care provider. What are the risks? Breathing too quickly may cause dizziness or cause you to pass out. Take your time so you do not get dizzy or light-headed. If you are in pain, you may need to take pain medicine before doing incentive spirometry. It is harder to take a deep breath if you are having pain. How to use your incentive spirometer  Sit up on the edge of your bed or on a chair. Hold the incentive spirometer so that it is in an upright position. Before you use the spirometer, breathe out normally. Place the mouthpiece in  your mouth. Make sure your lips are closed tightly around it. Breathe in slowly and as deeply as you can through your mouth, causing the piston or the ball to rise toward the top of the chamber. Hold your breath for 3-5 seconds, or for as long as possible. If the spirometer includes a coach indicator, use this to guide you in breathing. Slow down your breathing if the indicator goes above the marked areas. Remove the mouthpiece from your mouth and breathe out normally. The piston or ball will return to the bottom of the chamber. Rest for a few seconds, then  repeat the steps 10 or more times. Take your time and take a few normal breaths between deep breaths so that you do not get dizzy or light-headed. Do this every 1-2 hours when you are awake. If the spirometer includes a goal marker to show the highest number you have reached (best effort), use this as a goal to work toward during each repetition. After each set of 10 deep breaths, cough a few times. This will help to make sure that your lungs are clear. If you have an incision on your chest or abdomen from surgery, place a pillow or a rolled-up towel firmly against the incision when you cough. This can help to reduce pain while taking deep breaths and coughing. General tips When you are able to get out of bed: Walk around often. Continue to take deep breaths and cough in order to clear your lungs. Keep using the incentive spirometer until your health care provider says it is okay to stop using it. If you have been in the hospital, you may be told to keep using the spirometer at home. Contact a health care provider if: You are having difficulty using the spirometer. You have trouble using the spirometer as often as instructed. Your pain medicine is not giving enough relief for you to use the spirometer as told. You have a fever. Get help right away if: You develop shortness of breath. You develop a cough with bloody mucus from the  lungs. You have fluid or blood coming from an incision site after you cough. Summary An incentive spirometer is a tool that can help you learn to take long, deep breaths to keep your lungs clear and active. You may be asked to use a spirometer after a surgery, if you have a lung problem or a history of smoking, or if you have been inactive for a long period of time. Use your incentive spirometer as instructed every 1-2 hours while you are awake. If you have an incision on your chest or abdomen, place a pillow or a rolled-up towel firmly against your incision when you cough. This will help to reduce pain. Get help right away if you have shortness of breath, you cough up bloody mucus, or blood comes from your incision when you cough. This information is not intended to replace advice given to you by your health care provider. Make sure you discuss any questions you have with your health care provider. Document Revised: 11/24/2019 Document Reviewed: 11/24/2019 Elsevier Patient Education  2023 [employer]   POLAR CARE INFORMATION   OPERATING INSTRUCTIONS  Start the product With dry hands, connect the transformer to the electrical connection located on the top of the cooler. Next, plug the transformer into an appropriate electrical outlet. The unit will automatically start running at this point.  To stop the pump, disconnect electrical power.  Unplug to stop the product when not in use. Unplugging the Polar Care unit turns it off. Always unplug immediately after use. Never leave it plugged in while unattended. Remove pad.    FIRST ADD WATER TO FILL LINE, THEN ICE---Replace ice when existing ice is almost melted  1 Discuss Treatment with your Licensed Health Care Practitioner and Use Only as Prescribed 2 Apply Insulation Barrier & Cold Therapy Pad 3 Check for Moisture 4 Inspect Skin Regularly  Tips and Trouble Shooting Usage Tips 1. Use cubed or chunked ice for optimal performance. 2. It  is recommended to drain the Pad between uses. To drain the pad,  hold the Pad upright with the hose pointed toward the ground. Depress the black plunger and allow water to drain out. 3. You may disconnect the Pad from the unit without removing the pad from the affected area by depressing the silver tabs on the hose coupling and gently pulling the hoses apart. The Pad and unit will seal itself and will not leak. Note: Some dripping during release is normal. 4. DO NOT RUN PUMP WITHOUT WATER! The pump in this unit is designed to run with water. Running the unit without water will cause permanent damage to the pump. 5. Unplug unit before removing lid.  TROUBLESHOOTING GUIDE Pump not running, Water not flowing to the pad, Pad is not getting cold 1. Make sure the transformer is plugged into the wall outlet. 2. Confirm that the ice and water are filled to the indicated levels. 3. Make sure there are no kinks in the pad. 4. Gently pull on the blue tube to make sure the tube/pad junction is straight. 5. Remove the pad from the treatment site and ll it while the pad is lying at; then reapply. 6. Confirm that the pad couplings are securely attached to the unit. Listen for the double clicks (Figure 1) to confirm the pad couplings are securely attached.  Leaks    Note: Some condensation on the lines, controller, and pads is unavoidable, especially in warmer climates. 1. If using a [employer] Polar Care Cold Therapy unit with a detachable Cold Therapy Pad, and a leak exists (other than condensation on the lines) disconnect the pad couplings. Make sure the silver tabs on the couplings are depressed before reconnecting the pad to the pump hose; then confirm both sides of the coupling are properly clicked in. 2. If the coupling continues to leak or a leak is detected in the pad itself, stop using it and call [employer] Customer Care at [phone].  Cleaning After use, empty and dry the unit with a soft cloth. Warm water  and mild detergent may be used occasionally to clean the pump and tubes.  WARNING: The Polar Care Cube can be cold enough to cause serious injury, including full skin necrosis. Follow these Operating Instructions, and carefully read the Product Insert (see pouch on side of unit) and the Cold Therapy Pad Fitting Instructions (provided with each Cold Therapy Pad) prior to use. Preparing for Total Shoulder Arthroplasty  Before surgery, you can play an important role by reducing the number of germs on your skin by using the following products:  Benzoyl Peroxide Gel  o Reduces the number of germs present on the skin  o Applied twice a day to shoulder area starting two days before surgery  Chlorhexidine Gluconate (CHG) Soap  o An antiseptic cleaner that kills germs and bonds with the skin to continue killing germs even after washing  o Used for showering the night before surgery and morning of surgery  BENZOYL PEROXIDE 5% GEL  Please do not use if you have an allergy to benzoyl peroxide. If your skin becomes reddened/irritated stop using the benzoyl peroxide.  Starting two days before surgery, apply as follows:  1. Apply benzoyl peroxide in the morning and at night. Apply after taking a shower. If you are not taking a shower, clean entire shoulder front, back, and side along with the armpit with a clean wet washcloth.  2. Place a quarter-sized dollop on your shoulder and rub in thoroughly, making sure to cover the front, back, and side of your shoulder,  along with the armpit.  2 days before ____ AM ____ PM 1 day before ____ AM ____ PM  3. Do this twice a day for two days. (Last application is the night before surgery, AFTER using the CHG soap).  4. Do NOT apply benzoyl peroxide gel on the day of surgery.

## 2023-04-05 ENCOUNTER — Telehealth: Payer: Self-pay

## 2023-04-05 NOTE — Telephone Encounter (Signed)
Patient has been contacted to schedule her colonoscopy.  She said she is having shoulder surgery next week.  Informed her that her referral will be good for 1 year and she can call office back to schedule.  Reminder letter mailed.  Thanks, [place], [place]

## 2023-04-10 ENCOUNTER — Ambulatory Visit: Payer: Self-pay | Admitting: Urgent Care

## 2023-04-10 ENCOUNTER — Other Ambulatory Visit: Payer: Self-pay

## 2023-04-10 ENCOUNTER — Ambulatory Visit: Payer: 59 | Admitting: Anesthesiology

## 2023-04-10 ENCOUNTER — Ambulatory Visit: Payer: 59

## 2023-04-10 ENCOUNTER — Encounter
Admission: RE | Disposition: A | Discharge status: Home / Self Care | Payer: Self-pay | Source: Home / Self Care | Attending: Surgery

## 2023-04-10 ENCOUNTER — Encounter: Payer: Self-pay | Admitting: Surgery

## 2023-04-10 ENCOUNTER — Ambulatory Visit: Admission: RE | Admit: 2023-04-10 | Payer: 59 | Source: Home / Self Care | Admitting: Surgery

## 2023-04-10 DIAGNOSIS — S46101A Unspecified injury of muscle, fascia and tendon of long head of biceps, right arm, initial encounter: Secondary | ICD-10-CM | POA: Insufficient documentation

## 2023-04-10 DIAGNOSIS — M19011 Primary osteoarthritis, right shoulder: Secondary | ICD-10-CM | POA: Diagnosis not present

## 2023-04-10 DIAGNOSIS — Z79899 Other long term (current) drug therapy: Secondary | ICD-10-CM | POA: Diagnosis not present

## 2023-04-10 DIAGNOSIS — Z471 Aftercare following joint replacement surgery: Secondary | ICD-10-CM | POA: Diagnosis not present

## 2023-04-10 DIAGNOSIS — Z96611 Presence of right artificial shoulder joint: Secondary | ICD-10-CM | POA: Diagnosis not present

## 2023-04-10 DIAGNOSIS — I10 Essential (primary) hypertension: Secondary | ICD-10-CM | POA: Diagnosis not present

## 2023-04-10 DIAGNOSIS — E118 Type 2 diabetes mellitus with unspecified complications: Secondary | ICD-10-CM

## 2023-04-10 DIAGNOSIS — X58XXXA Exposure to other specified factors, initial encounter: Secondary | ICD-10-CM | POA: Diagnosis not present

## 2023-04-10 DIAGNOSIS — E1140 Type 2 diabetes mellitus with diabetic neuropathy, unspecified: Secondary | ICD-10-CM | POA: Diagnosis not present

## 2023-04-10 DIAGNOSIS — Z5982 Transportation insecurity: Secondary | ICD-10-CM | POA: Diagnosis not present

## 2023-04-10 DIAGNOSIS — M25511 Pain in right shoulder: Secondary | ICD-10-CM | POA: Diagnosis not present

## 2023-04-10 DIAGNOSIS — M47812 Spondylosis without myelopathy or radiculopathy, cervical region: Secondary | ICD-10-CM | POA: Diagnosis not present

## 2023-04-10 DIAGNOSIS — E1142 Type 2 diabetes mellitus with diabetic polyneuropathy: Secondary | ICD-10-CM | POA: Diagnosis not present

## 2023-04-10 DIAGNOSIS — F17210 Nicotine dependence, cigarettes, uncomplicated: Secondary | ICD-10-CM | POA: Insufficient documentation

## 2023-04-10 DIAGNOSIS — Z7984 Long term (current) use of oral hypoglycemic drugs: Secondary | ICD-10-CM | POA: Insufficient documentation

## 2023-04-10 DIAGNOSIS — M7581 Other shoulder lesions, right shoulder: Secondary | ICD-10-CM | POA: Insufficient documentation

## 2023-04-10 DIAGNOSIS — J4489 Other specified chronic obstructive pulmonary disease: Secondary | ICD-10-CM | POA: Diagnosis not present

## 2023-04-10 DIAGNOSIS — Z5986 Financial insecurity: Secondary | ICD-10-CM | POA: Diagnosis not present

## 2023-04-10 DIAGNOSIS — F32A Depression, unspecified: Secondary | ICD-10-CM | POA: Diagnosis not present

## 2023-04-10 DIAGNOSIS — M7521 Bicipital tendinitis, right shoulder: Secondary | ICD-10-CM | POA: Diagnosis not present

## 2023-04-10 DIAGNOSIS — G8918 Other acute postprocedural pain: Secondary | ICD-10-CM | POA: Diagnosis not present

## 2023-04-10 HISTORY — PX: REVERSE SHOULDER ARTHROPLASTY: SHX5054

## 2023-04-10 LAB — GLUCOSE, CAPILLARY
Glucose-Capillary: 129 mg/dL — ABNORMAL HIGH (ref 70–99)
Glucose-Capillary: 206 mg/dL — ABNORMAL HIGH (ref 70–99)

## 2023-04-10 SURGERY — ARTHROPLASTY, SHOULDER, TOTAL, REVERSE
Anesthesia: General | Site: Shoulder | Laterality: Right

## 2023-04-10 MED ORDER — ROCURONIUM BROMIDE 100 MG/10ML IV SOLN
INTRAVENOUS | Status: DC | PRN
  Administered 2023-04-10: 50 mg via INTRAVENOUS

## 2023-04-10 MED ORDER — PROPOFOL 1000 MG/100ML IV EMUL
INTRAVENOUS | Status: AC
  Filled 2023-04-10: qty 100

## 2023-04-10 MED ORDER — CEFAZOLIN SODIUM-DEXTROSE 2-4 GM/100ML-% IV SOLN
2.0000 g | Freq: Four times a day (QID) | INTRAVENOUS | Status: DC
  Administered 2023-04-10: 2 g via INTRAVENOUS

## 2023-04-10 MED ORDER — SUGAMMADEX SODIUM 200 MG/2ML IV SOLN
INTRAVENOUS | Status: DC | PRN
  Administered 2023-04-10: 200 mg via INTRAVENOUS

## 2023-04-10 MED ORDER — TRANEXAMIC ACID 1000 MG/10ML IV SOLN
INTRAVENOUS | Status: DC | PRN
  Administered 2023-04-10: 1000 mg via TOPICAL

## 2023-04-10 MED ORDER — FAMOTIDINE 20 MG PO TABS
ORAL_TABLET | ORAL | Status: AC
  Filled 2023-04-10: qty 1

## 2023-04-10 MED ORDER — CEFAZOLIN SODIUM-DEXTROSE 2-4 GM/100ML-% IV SOLN
INTRAVENOUS | Status: AC
  Filled 2023-04-10: qty 100

## 2023-04-10 MED ORDER — BUPIVACAINE-EPINEPHRINE (PF) 0.5% -1:200000 IJ SOLN
INTRAMUSCULAR | Status: AC
  Filled 2023-04-10: qty 10

## 2023-04-10 MED ORDER — BUPIVACAINE-EPINEPHRINE (PF) 0.5% -1:200000 IJ SOLN
INTRAMUSCULAR | Status: DC | PRN
  Administered 2023-04-10: 30 mL

## 2023-04-10 MED ORDER — CHLORHEXIDINE GLUCONATE 0.12 % MT SOLN
15.0000 mL | Freq: Once | OROMUCOSAL | Status: AC
  Administered 2023-04-10: 15 mL via OROMUCOSAL

## 2023-04-10 MED ORDER — BUPIVACAINE HCL (PF) 0.5 % IJ SOLN
INTRAMUSCULAR | Status: AC
  Filled 2023-04-10: qty 10

## 2023-04-10 MED ORDER — METOCLOPRAMIDE HCL 5 MG/ML IJ SOLN: 5.0000 mg | Freq: Three times a day (TID) | INTRAMUSCULAR | Status: DC | PRN

## 2023-04-10 MED ORDER — BUPIVACAINE HCL (PF) 0.5 % IJ SOLN
INTRAMUSCULAR | Status: DC | PRN
  Administered 2023-04-10: 10 mL

## 2023-04-10 MED ORDER — FAMOTIDINE 20 MG PO TABS
20.0000 mg | ORAL_TABLET | Freq: Once | ORAL | Status: AC
  Administered 2023-04-10: 20 mg via ORAL

## 2023-04-10 MED ORDER — ONDANSETRON HCL 4 MG/2ML IJ SOLN: 4.0000 mg | Freq: Four times a day (QID) | INTRAMUSCULAR | Status: DC | PRN

## 2023-04-10 MED ORDER — OXYCODONE HCL 5 MG PO TABS: 5.0000 mg | ORAL_TABLET | ORAL | 0 refills | Status: DC | PRN

## 2023-04-10 MED ORDER — MIDAZOLAM HCL 2 MG/2ML IJ SOLN
INTRAMUSCULAR | Status: AC
  Filled 2023-04-10: qty 2

## 2023-04-10 MED ORDER — ONDANSETRON HCL 4 MG PO TABS: 4.0000 mg | ORAL_TABLET | Freq: Four times a day (QID) | ORAL | Status: DC | PRN

## 2023-04-10 MED ORDER — BUPIVACAINE LIPOSOME 1.3 % IJ SUSP
INTRAMUSCULAR | Status: DC | PRN
  Administered 2023-04-10: 20 mL

## 2023-04-10 MED ORDER — FENTANYL CITRATE (PF) 100 MCG/2ML IJ SOLN
INTRAMUSCULAR | Status: AC
  Filled 2023-04-10: qty 2

## 2023-04-10 MED ORDER — SODIUM CHLORIDE 0.9 % IR SOLN
Status: DC | PRN
  Administered 2023-04-10: 3000 mL

## 2023-04-10 MED ORDER — SODIUM CHLORIDE 0.9 % IV SOLN: INTRAVENOUS | Status: DC

## 2023-04-10 MED ORDER — ACETAMINOPHEN 10 MG/ML IV SOLN
INTRAVENOUS | Status: AC
  Filled 2023-04-10: qty 100

## 2023-04-10 MED ORDER — FENTANYL CITRATE (PF) 100 MCG/2ML IJ SOLN
INTRAMUSCULAR | Status: DC | PRN
  Administered 2023-04-10 (×2): 50 ug via INTRAVENOUS

## 2023-04-10 MED ORDER — PROPOFOL 10 MG/ML IV BOLUS
INTRAVENOUS | Status: DC | PRN
Start: 2023-04-10 — End: 2023-04-10
  Administered 2023-04-10: 50 mg via INTRAVENOUS
  Administered 2023-04-10: 20 mg via INTRAVENOUS
  Administered 2023-04-10: 80 mg via INTRAVENOUS
  Administered 2023-04-10: 40 mg via INTRAVENOUS

## 2023-04-10 MED ORDER — TRIAMCINOLONE ACETONIDE 40 MG/ML IJ SUSP
INTRAMUSCULAR | Status: AC
  Filled 2023-04-10: qty 2

## 2023-04-10 MED ORDER — DEXAMETHASONE SODIUM PHOSPHATE 10 MG/ML IJ SOLN
INTRAMUSCULAR | Status: DC | PRN
  Administered 2023-04-10: 5 mg via INTRAVENOUS

## 2023-04-10 MED ORDER — OXYCODONE HCL 5 MG PO TABS: 5.0000 mg | ORAL_TABLET | ORAL | Status: DC | PRN

## 2023-04-10 MED ORDER — BUPIVACAINE LIPOSOME 1.3 % IJ SUSP
INTRAMUSCULAR | Status: AC
  Filled 2023-04-10: qty 20

## 2023-04-10 MED ORDER — OXYCODONE HCL 5 MG/5ML PO SOLN: 5.0000 mg | Freq: Once | ORAL | Status: DC | PRN

## 2023-04-10 MED ORDER — ONDANSETRON HCL 4 MG/2ML IJ SOLN
INTRAMUSCULAR | Status: DC | PRN
  Administered 2023-04-10: 4 mg via INTRAVENOUS

## 2023-04-10 MED ORDER — SODIUM CHLORIDE FLUSH 0.9 % IV SOLN
INTRAVENOUS | Status: AC
  Filled 2023-04-10: qty 20

## 2023-04-10 MED ORDER — PHENYLEPHRINE HCL-NACL 20-0.9 MG/250ML-% IV SOLN
INTRAVENOUS | Status: DC | PRN
  Administered 2023-04-10: 20 ug/min via INTRAVENOUS

## 2023-04-10 MED ORDER — FENTANYL CITRATE (PF) 100 MCG/2ML IJ SOLN: 25.0000 ug | INTRAMUSCULAR | Status: DC | PRN

## 2023-04-10 MED ORDER — KETOROLAC TROMETHAMINE 15 MG/ML IJ SOLN
15.0000 mg | Freq: Once | INTRAMUSCULAR | Status: AC
  Administered 2023-04-10: 15 mg via INTRAVENOUS

## 2023-04-10 MED ORDER — LIDOCAINE HCL (CARDIAC) PF 100 MG/5ML IV SOSY
PREFILLED_SYRINGE | INTRAVENOUS | Status: DC | PRN
  Administered 2023-04-10: 50 mg via INTRAVENOUS

## 2023-04-10 MED ORDER — PROPOFOL 10 MG/ML IV BOLUS
INTRAVENOUS | Status: AC
  Filled 2023-04-10: qty 20

## 2023-04-10 MED ORDER — ORAL CARE MOUTH RINSE: 15.0000 mL | Freq: Once | OROMUCOSAL | Status: AC

## 2023-04-10 MED ORDER — METOCLOPRAMIDE HCL 10 MG PO TABS: 5.0000 mg | ORAL_TABLET | Freq: Three times a day (TID) | ORAL | Status: DC | PRN

## 2023-04-10 MED ORDER — CEFAZOLIN SODIUM-DEXTROSE 2-4 GM/100ML-% IV SOLN
2.0000 g | INTRAVENOUS | Status: AC
  Administered 2023-04-10: 2 g via INTRAVENOUS

## 2023-04-10 MED ORDER — PHENYLEPHRINE HCL (PRESSORS) 10 MG/ML IV SOLN
INTRAVENOUS | Status: DC | PRN
  Administered 2023-04-10: 80 ug via INTRAVENOUS
  Administered 2023-04-10: 160 ug via INTRAVENOUS
  Administered 2023-04-10: 80 ug via INTRAVENOUS

## 2023-04-10 MED ORDER — 0.9 % SODIUM CHLORIDE (POUR BTL) OPTIME
TOPICAL | Status: DC | PRN
  Administered 2023-04-10: 500 mL

## 2023-04-10 MED ORDER — ACETAMINOPHEN 10 MG/ML IV SOLN: 1000.0000 mg | Freq: Once | INTRAVENOUS | Status: DC | PRN

## 2023-04-10 MED ORDER — MIDAZOLAM HCL 2 MG/2ML IJ SOLN
1.0000 mg | Freq: Once | INTRAMUSCULAR | Status: AC
  Administered 2023-04-10: 1 mg via INTRAVENOUS

## 2023-04-10 MED ORDER — ONDANSETRON HCL 4 MG/2ML IJ SOLN: 4.0000 mg | Freq: Once | INTRAMUSCULAR | Status: DC | PRN

## 2023-04-10 MED ORDER — CHLORHEXIDINE GLUCONATE 0.12 % MT SOLN
OROMUCOSAL | Status: AC
  Filled 2023-04-10: qty 15

## 2023-04-10 MED ORDER — LACTATED RINGERS IV SOLN: INTRAVENOUS | Status: DC | PRN

## 2023-04-10 MED ORDER — KETOROLAC TROMETHAMINE 15 MG/ML IJ SOLN
INTRAMUSCULAR | Status: AC
  Filled 2023-04-10: qty 1

## 2023-04-10 MED ORDER — BUPIVACAINE LIPOSOME 1.3 % IJ SUSP
INTRAMUSCULAR | Status: AC
  Filled 2023-04-10: qty 10

## 2023-04-10 MED ORDER — PHENYLEPHRINE HCL-NACL 20-0.9 MG/250ML-% IV SOLN
INTRAVENOUS | Status: AC
  Filled 2023-04-10: qty 250

## 2023-04-10 MED ORDER — ACETAMINOPHEN 325 MG PO TABS: 325.0000 mg | ORAL_TABLET | Freq: Four times a day (QID) | ORAL | Status: DC | PRN

## 2023-04-10 MED ORDER — ACETAMINOPHEN 10 MG/ML IV SOLN
INTRAVENOUS | Status: DC | PRN
  Administered 2023-04-10: 1000 mg via INTRAVENOUS

## 2023-04-10 MED ORDER — TRANEXAMIC ACID 1000 MG/10ML IV SOLN
INTRAVENOUS | Status: AC
  Filled 2023-04-10: qty 10

## 2023-04-10 MED ORDER — OXYCODONE HCL 5 MG PO TABS: 5.0000 mg | ORAL_TABLET | Freq: Once | ORAL | Status: DC | PRN

## 2023-04-10 SURGICAL SUPPLY — 68 items
APL PRP STRL LF DISP 70% ISPRP (MISCELLANEOUS) ×4
BASEPLATE GLENOSPHERE 25 (Plate) IMPLANT
BEARING HUMERAL SHLDER 36M STD (Shoulder) IMPLANT
BIT DRILL TWIST 2.7 (BIT) IMPLANT
BLADE SAW SAG 25X90X1.19 (BLADE) ×1 IMPLANT
BRNG HUM STD 36 RVRS SHLDR (Shoulder) ×1 IMPLANT
CHLORAPREP W/TINT 26 (MISCELLANEOUS) ×1 IMPLANT
COOLER POLAR GLACIER W/PUMP (MISCELLANEOUS) ×1 IMPLANT
COVER BACK TABLE REUSABLE LG (DRAPES) ×1 IMPLANT
DRAPE 3/4 80X56 (DRAPES) ×1 IMPLANT
DRAPE INCISE IOBAN 66X45 STRL (DRAPES) ×1 IMPLANT
DRSG OPSITE POSTOP 4X6 (GAUZE/BANDAGES/DRESSINGS) IMPLANT
DRSG OPSITE POSTOP 4X8 (GAUZE/BANDAGES/DRESSINGS) ×1 IMPLANT
ELECT BLADE 6.5 EXT (BLADE) IMPLANT
ELECT CAUTERY BLADE 6.4 (BLADE) ×1 IMPLANT
ELECT REM PT RETURN 9FT ADLT (ELECTROSURGICAL) ×1
ELECTRODE REM PT RTRN 9FT ADLT (ELECTROSURGICAL) ×1 IMPLANT
GAUZE XEROFORM 1X8 LF (GAUZE/BANDAGES/DRESSINGS) ×1 IMPLANT
GLENOID SPHERE 36MM CVD +3 (Orthopedic Implant) IMPLANT
GLOVE BIO SURGEON STRL SZ7.5 (GLOVE) ×4 IMPLANT
GLOVE BIO SURGEON STRL SZ8 (GLOVE) ×4 IMPLANT
GLOVE BIOGEL PI IND STRL 8 (GLOVE) ×2 IMPLANT
GLOVE INDICATOR 8.0 STRL GRN (GLOVE) ×1 IMPLANT
GOWN STRL REUS W/ TWL LRG LVL3 (GOWN DISPOSABLE) ×1 IMPLANT
GOWN STRL REUS W/ TWL XL LVL3 (GOWN DISPOSABLE) ×1 IMPLANT
GOWN STRL REUS W/TWL LRG LVL3 (GOWN DISPOSABLE) ×1
GOWN STRL REUS W/TWL XL LVL3 (GOWN DISPOSABLE) ×1
HANDLE YANKAUER SUCT OPEN TIP (MISCELLANEOUS) ×1 IMPLANT
HOOD PEEL AWAY T7 (MISCELLANEOUS) ×3 IMPLANT
IV NS IRRIG 3000ML ARTHROMATIC (IV SOLUTION) ×1 IMPLANT
KIT STABILIZATION SHOULDER (MISCELLANEOUS) ×1 IMPLANT
KIT TURNOVER KIT A (KITS) ×1 IMPLANT
MANIFOLD NEPTUNE II (INSTRUMENTS) ×1 IMPLANT
MASK FACE SPIDER DISP (MASK) ×1 IMPLANT
MAT ABSORB FLUID 56X50 GRAY (MISCELLANEOUS) ×1 IMPLANT
NDL MAYO CATGUT SZ1 (NEEDLE) IMPLANT
NDL SAFETY ECLIP 18X1.5 (MISCELLANEOUS) ×1 IMPLANT
NDL SPNL 20GX3.5 QUINCKE YW (NEEDLE) ×1 IMPLANT
NEEDLE MAYO CATGUT SZ1 (NEEDLE) IMPLANT
NEEDLE SPNL 20GX3.5 QUINCKE YW (NEEDLE) ×1 IMPLANT
NS IRRIG 500ML POUR BTL (IV SOLUTION) ×1 IMPLANT
PACK ARTHROSCOPY SHOULDER (MISCELLANEOUS) ×1 IMPLANT
PAD ARMBOARD 7.5X6 YLW CONV (MISCELLANEOUS) ×1 IMPLANT
PAD WRAPON POLAR SHDR UNIV (MISCELLANEOUS) ×1 IMPLANT
PIN THREADED REVERSE (PIN) IMPLANT
PULSAVAC PLUS IRRIG FAN TIP (DISPOSABLE) ×1
SCREW CENTRAL 6.5X20MM (Screw) IMPLANT
SCREW LOCKING 4.75MMX15MM (Screw) IMPLANT
SCREW LOCKING NS 4.75MMX20MM (Screw) IMPLANT
SHOULDER HUMERAL BEAR 36M STD (Shoulder) ×1 IMPLANT
SLING ULTRA II M (MISCELLANEOUS) IMPLANT
SPONGE T-LAP 18X18 ~~LOC~~+RFID (SPONGE) ×2 IMPLANT
STAPLER SKIN PROX 35W (STAPLE) ×1 IMPLANT
STEM HUMERAL MICRO SZ11 (Stem) IMPLANT
SUT ETHIBOND 0 MO6 C/R (SUTURE) ×1 IMPLANT
SUT FIBERWIRE #2 38 BLUE 1/2 (SUTURE) ×4
SUT VIC AB 0 CT1 36 (SUTURE) ×1 IMPLANT
SUT VIC AB 2-0 CT1 27 (SUTURE) ×2
SUT VIC AB 2-0 CT1 TAPERPNT 27 (SUTURE) ×2 IMPLANT
SUTURE FIBERWR #2 38 BLUE 1/2 (SUTURE) ×4 IMPLANT
SYR 10ML LL (SYRINGE) ×1 IMPLANT
SYR 30ML LL (SYRINGE) ×1 IMPLANT
SYR TOOMEY 50ML (SYRINGE) ×1 IMPLANT
TIP FAN IRRIG PULSAVAC PLUS (DISPOSABLE) ×1 IMPLANT
TRAP FLUID SMOKE EVACUATOR (MISCELLANEOUS) ×1 IMPLANT
TRAY HUMERAL NEUTRAL EXT 6 (Shoulder) IMPLANT
WATER STERILE IRR 500ML POUR (IV SOLUTION) ×1 IMPLANT
WRAPON POLAR PAD SHDR UNIV (MISCELLANEOUS) ×1

## 2023-04-10 NOTE — Discharge Instructions (Addendum)
Orthopedic discharge instructions: May shower with intact OpSite dressing once nerve block has worn off (Saturday).  Apply ice frequently to shoulder or use Polar Care device. Take ES Tylenol if necessary for pain. May take pain medication for more severe pain. Resume baby aspirin daily starting tomorrow. Keep shoulder immobilizer on at all times except may remove for bathing purposes. Follow-up in 10-14 days or as scheduled.   Interscalene Nerve Block (ISNB) Discharge Instructions    For your surgery you have received an Interscalene Nerve Block. Nerve Blocks affect many types of nerves, including nerves that control movement, pain and normal sensation.  You may experience feelings such as numbness, tingling, heaviness, weakness or the inability to move your arm or the feeling or sensation that your arm has "fallen asleep". A nerve block can last for 2 - 36 hours or more depending on the medication used.  Usually the weakness wears off first.  The tingling and heaviness usually wear off next.  Finally you may start to notice pain.  Keep in mind that this may occur in any order.  once a nerve block starts to wear off it is usually completely gone within 60 minutes. ISNB may cause mild shortness of breath, a hoarse voice, blurry vision, unequal pupils, or drooping of the face on the same side as the nerve block.  These symptoms will usually go away within 12 hours.  Very rarely the procedure itself can cause mild seizures. If needed, your surgeon will give you a prescription for pain medication.  It will take about 60 minutes for the oral pain medication to become fully effective.  So, it is recommended that you start taking this medication before the nerve block first begins to wear off, or when you first begin to feel discomfort. Keep in mind that nerve blocks often wear off in the middle of the night.   If you are going to bed and the block has not started to wear off or you have not started to  have any discomfort, consider setting an alarm for 2 to 3 hours, so you can assess your block.  If you notice the block is wearing off or you are starting to have discomfort, you can take your pain medication. Take your pain medication only as prescribed.  Pain medication can cause sedation and decrease your breathing if you take more than you need for the level of pain that you have. Nausea is a common side effect of many pain medications.  You may want to eat something before taking your pain medicine to prevent nausea. After an Interscalene nerve block, you cannot feel pain, pressure or extremes in temperature in the effected arm.  Because your arm is numb it is at an increased risk for injury.  To decrease the possibility of injury, please practice the following:  While you are awake change the position of your arm frequently to prevent too much pressure on any one area for prolonged periods of time.  If you have a cast or tight dressing, check the color or your fingers every couple of hours.  Call your surgeon with the appearance of any discoloration (white or blue). If you are given a sling to wear before you go home, please wear it  at all times until the block has completely worn off.  Do not get up at night without your sling. If you experience any problems or concerns, please contact your surgeon's office. If you experience severe or prolonged shortness of breath  go to the nearest emergency department.   POLAR CARE INFORMATION  MassAdvertisement.it  How to use [employer] Polar Care [name] Therapy System?  YouTube   ShippingScam.co.uk  OPERATING INSTRUCTIONS  Start the product With dry hands, connect the transformer to the electrical connection located on the top of the cooler. Next, plug the transformer into an appropriate electrical outlet. The unit will automatically start running at this point.  To stop the pump, disconnect electrical power.  Unplug to stop the  product when not in use. Unplugging the Polar Care unit turns it off. Always unplug immediately after use. Never leave it plugged in while unattended. Remove pad.    FIRST ADD WATER TO FILL LINE, THEN ICE---Replace ice when existing ice is almost melted  1 Discuss Treatment with your Licensed Health Care Practitioner and Use Only as Prescribed 2 Apply Insulation Barrier & Cold Therapy Pad 3 Check for Moisture 4 Inspect Skin Regularly  Tips and Trouble Shooting Usage Tips 1. Use cubed or chunked ice for optimal performance. 2. It is recommended to drain the Pad between uses. To drain the pad, hold the Pad upright with the hose pointed toward the ground. Depress the black plunger and allow water to drain out. 3. You may disconnect the Pad from the unit without removing the pad from the affected area by depressing the silver tabs on the hose coupling and gently pulling the hoses apart. The Pad and unit will seal itself and will not leak. Note: Some dripping during release is normal. 4. DO NOT RUN PUMP WITHOUT WATER! The pump in this unit is designed to run with water. Running the unit without water will cause permanent damage to the pump. 5. Unplug unit before removing lid.  TROUBLESHOOTING GUIDE Pump not running, Water not flowing to the pad, Pad is not getting cold 1. Make sure the transformer is plugged into the wall outlet. 2. Confirm that the ice and water are filled to the indicated levels. 3. Make sure there are no kinks in the pad. 4. Gently pull on the blue tube to make sure the tube/pad junction is straight. 5. Remove the pad from the treatment site and ll it while the pad is lying at; then reapply. 6. Confirm that the pad couplings are securely attached to the unit. Listen for the double clicks (Figure 1) to confirm the pad couplings are securely attached.  Leaks    Note: Some condensation on the lines, controller, and pads is unavoidable, especially in warmer climates. 1. If using  a Breg Polar Care Cold Therapy unit with a detachable Cold Therapy Pad, and a leak exists (other than condensation on the lines) disconnect the pad couplings. Make sure the silver tabs on the couplings are depressed before reconnecting the pad to the pump hose; then confirm both sides of the coupling are properly clicked in. 2. If the coupling continues to leak or a leak is detected in the pad itself, stop using it and call [employer] Customer Care at [phone].  Cleaning After use, empty and dry the unit with a soft cloth. Warm water and mild detergent may be used occasionally to clean the pump and tubes.  WARNING: The Polar Care Cube can be cold enough to cause serious injury, including full skin necrosis. Follow these Operating Instructions, and carefully read the Product Insert (see pouch on side of unit) and the Cold Therapy Pad Fitting Instructions (provided with each Cold Therapy Pad) prior to use.    DeRoyal Abductor  sling instructions and diagram: (Blue ball)  Youtube video: https://youtu.be/[email or web address]  Please contact your surgeon's office if you have any questions about this shoulder immobilizer.          AMBULATORY SURGERY  DISCHARGE INSTRUCTIONS   The drugs that you were given will stay in your system until tomorrow so for the next 24 hours you should not:  Drive an automobile Make any legal decisions Drink any alcoholic beverage   You may resume regular meals tomorrow.  Today it is better to start with liquids and gradually work up to solid foods.  You may eat anything you prefer, but it is better to start with liquids, then soup and crackers, and gradually work up to solid foods.   Please notify your doctor immediately if you have any unusual bleeding, trouble breathing, redness and pain at the surgery site, drainage, fever, or pain not relieved by medication.    Additional Instructions: PLEASE LEAVE TEAL BRACELET ON FOR 4 DAYS        Please contact  your physician with any problems or Same Day Surgery at [phone], Monday through Friday 6 am to 4 pm, or [place] at [facility] number at [phone].

## 2023-04-10 NOTE — Op Note (Signed)
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  Post-Op Diagnosis:   Same  Procedure:   Reverse right total shoulder arthroplasty with biceps tenodesis.  Surgeon:   Maryagnes Amos, MD  Assistant:   Horris Latino, PA-C  Anesthesia:   General endotracheal with an interscalene block using Exparel placed preoperatively by the anesthesiologist.  Findings:   As above.  Complications:   None  EBL:   20 cc  Fluids:   300 cc crystalloid  UOP:   None  TT:   None  Drains:   None  Closure:   Staples  Implants:   All press-fit [employer] Comprehensive system with a #11 identity micro-humeral stem, a -6 mm extended neutral identity humeral tray with a +0 mm insert, and a mini-base plate with a 36 mm mm concentric glenosphere with +3 mm of lateral offset.  Brief Clinical Note:   The patient is a 77 year old female with a long history of progressively worsening pain and limited motion of the right shoulder. The patient's symptoms have progressed despite medications, activity modification, etc. The patient's history and examination are consistent with advanced degenerative joint disease with rotator cuff and biceps tendinopathy, all of which were confirmed by MRI scan preoperatively. The patient presents at this time for a reverse right total shoulder arthroplasty with biceps tenodesis.  Procedure:   The patient underwent placement of an interscalene block using Exparel by the anesthesiologist in the preoperative holding area before being brought into the operating room and lain in the supine position. The patient then underwent general endotracheal intubation and anesthesia before the patient was repositioned in the beach chair position using the beach chair positioner. The right shoulder and upper extremity were prepped with ChloraPrep solution before being  draped sterilely. Preoperative antibiotics were administered. A timeout was performed to verify the appropriate surgical site.    A standard anterior approach to the shoulder was made through an approximately 4-5 inch incision. The incision was carried down through the subcutaneous tissues to expose the deltopectoral fascia. The interval between the deltoid and pectoralis muscles was identified and this plane developed, retracting the cephalic vein laterally with the deltoid muscle. The conjoined tendon was identified. Its lateral margin was dissected and the Kolbel self-retraining retractor inserted. The "three sisters" were identified and cauterized. Bursal tissues were removed to improve visualization.   The biceps tendon was identified near the inferior aspect of the bicipital groove. A soft tissue tenodesis was performed by attaching the biceps tendon to the adjacent pectoralis major tendon using two #0 Ethibond interrupted sutures. The biceps tendon was then transected just proximal to the tenodesis site. The subscapularis tendon was released from its attachment to the lesser tuberosity 1 cm proximal to its insertion and several tagging sutures placed. The inferior capsule was released with care after identifying and protecting the axillary nerve. The proximal humeral cut was made at approximately 25 of retroversion using the extra-medullary guide.   Attention was redirected to the glenoid. The labrum was debrided circumferentially before the center of the glenoid was marked with electrocautery. The guidewire was drilled into the glenoid vault using the appropriate guide. After verifying its position, it was overreamed with the mini-baseplate reamer to create a flat surface. The permanent mini-baseplate was impacted into place. It was stabilized with a 20 x 6.5 mm central screw and four peripheral locking screws. The permanent  36 mm concentric glenosphere with +3 mm of lateral offset was then impacted  into place and its Morse taper locking mechanism verified using manual distraction.  Attention was directed to the humeral side. The humeral canal was reamed sequentially beginning with the end-cutting reamer then progressing from a 4 mm reamer up to an 11 mm reamer. This provided excellent circumferential chatter. The canal was broached beginning with a #9 broach and progressing to a #11 broach.  The plastic stem was inserted into the end of the broach and the proximal reaming performed. A trial reduction was performed using the -6 mm extended neutral humeral platform with the +0 mm insert. With the +0 mm insert, the arm demonstrated excellent range of motion as the hand could be brought across the chest to the opposite shoulder and brought to the top of the patient's head and to the patient's ear. The shoulder appeared stable throughout this range of motion. The joint was dislocated and the trial components removed.   The permanent #11 Identity micro-stem was connected with the -6 mm extended neutral humeral platform on the back table before this construct was impacted into place with care taken to maintain the appropriate version. The +0 mm insert was snapped into place. The shoulder was relocated using two finger pressure and again placed through a range of motion with the findings as described above.  The wound was copiously irrigated with sterile saline solution using the jet lavage system before a total of 30 cc of 0.5% Sensorcaine with epinephrine was injected into the pericapsular and peri-incisional tissues to help with postoperative analgesia. The subscapularis tendon was reapproximated using #2 FiberWire interrupted sutures. The deltopectoral interval was closed using #0 Vicryl interrupted sutures before the subcutaneous tissues were closed using 2-0 Vicryl interrupted sutures. The skin was closed using staples. Prior to closing the skin, 1 g of transexemic acid in 10 cc of normal saline was  injected intra-articularly to help with postoperative bleeding. A sterile occlusive dressing was applied to the wound before the arm was placed into a shoulder immobilizer with an abduction pillow. A Polar Care system also was applied to the shoulder. The patient was then transferred back to a hospital bed before being awakened, extubated, and returned to the recovery room in satisfactory condition after tolerating the procedure well.

## 2023-04-10 NOTE — Progress Notes (Signed)
Patient is not able to walk the distance required to go the bathroom, or he/she is unable to safely negotiate stairs required to access the bathroom.  A 3in1 BSC will alleviate this problem  

## 2023-04-10 NOTE — Anesthesia Procedure Notes (Signed)
Procedure Name: Intubation Date/Time: 04/10/2023 8:00 AM  Performed by: Merlene Pulling, CRNAPre-anesthesia Checklist: Patient identified, Patient being monitored, Timeout performed, Emergency Drugs available and Suction available Patient Re-evaluated:Patient Re-evaluated prior to induction Oxygen Delivery Method: Circle system utilized Preoxygenation: Pre-oxygenation with 100% oxygen Induction Type: IV induction Ventilation: Mask ventilation without difficulty Laryngoscope Size: Mac and 3 Grade View: Grade I Tube type: Oral Tube size: 6.5 mm Number of attempts: 1 Airway Equipment and Method: Stylet Placement Confirmation: ETT inserted through vocal cords under direct vision, positive ETCO2 and breath sounds checked- equal and bilateral Secured at: 20 cm Tube secured with: Tape Dental Injury: Teeth and Oropharynx as per pre-operative assessment

## 2023-04-10 NOTE — Anesthesia Procedure Notes (Signed)
Anesthesia Regional Block: Interscalene brachial plexus block   Pre-Anesthetic Checklist: , timeout performed,  Correct Patient, Correct Site, Correct Laterality,  Correct Procedure, Correct Position, site marked,  Risks and benefits discussed,  Surgical consent,  Pre-op evaluation,  At surgeon's request and post-op pain management  Laterality: Right  Prep: chloraprep       Needles:  Injection technique: Single-shot  Needle Type: Echogenic Needle     Needle Length: 4cm  Needle Gauge: 25     Additional Needles:   Procedures:,,,, ultrasound used (permanent image in chart),,    Narrative:  Injection made incrementally with aspirations every 5 mL.  Performed by: Personally  Anesthesiologist: Corinda Gubler, MD  Additional Notes: Patient's chart reviewed and they were deemed appropriate candidate for procedure, at surgeon's request. Patient educated about risks, benefits, and alternatives of the block including but not limited to: temporary or permanent nerve damage, bleeding, infection, damage to surround tissues, pneumothorax, hemidiaphragmatic paralysis, unilateral Horner's syndrome, block failure, local anesthetic toxicity. Patient expressed understanding. A formal time-out was conducted consistent with institution rules.  Monitors were applied, and minimal sedation used (see nursing record). The site was prepped with skin prep and allowed to dry, and sterile gloves were used. A high frequency linear ultrasound probe with probe cover was utilized throughout. C5-7 nerve roots located and appeared anatomically normal, local anesthetic injected around them, and echogenic block needle trajectory was monitored throughout. Aspiration performed every 5ml. Lung and blood vessels were avoided. All injections were performed without resistance and free of blood and paresthesias. The patient tolerated the procedure well.  Injectate: 20ml exparel + 10ml 0.5% bupivacaine

## 2023-04-10 NOTE — Anesthesia Postprocedure Evaluation (Signed)
Anesthesia Post Note  Patient: Kendra [name]  Procedure(s) Performed: REVERSE SHOULDER ARTHROPLASTY WITH BICEPS TENODESIS (Right: Shoulder)  Patient location during evaluation: PACU Anesthesia Type: General Level of consciousness: awake and alert Pain management: pain level controlled Vital Signs Assessment: post-procedure vital signs reviewed and stable Respiratory status: spontaneous breathing, nonlabored ventilation, respiratory function stable and patient connected to nasal cannula oxygen Cardiovascular status: blood pressure returned to baseline and stable Postop Assessment: no apparent nausea or vomiting Anesthetic complications: no   No notable events documented.   Last Vitals:  Vitals:   04/10/23 1053 04/10/23 1055  BP: 114/77   Pulse: 78   Resp: 19   Temp: (!) 36.2 C   SpO2:  94%    Last Pain:  Vitals:   04/10/23 1053  TempSrc: Temporal  PainSc: 0-No pain                 Corinda Gubler

## 2023-04-10 NOTE — Anesthesia Preprocedure Evaluation (Signed)
Anesthesia Evaluation  Patient identified by MRN, date of birth, ID band Patient awake    Reviewed: Allergy & Precautions, NPO status , Patient's Chart, lab work & pertinent test results  History of Anesthesia Complications Negative for: history of anesthetic complications  Airway Mallampati: III  TM Distance: >3 FB Neck ROM: Limited    Dental  (+) Edentulous Upper, Edentulous Lower   Pulmonary asthma , neg sleep apnea, COPD,  COPD inhaler, Current Smoker and Patient abstained from smoking. Preop o2 on room air = 97%   Pulmonary exam normal breath sounds clear to auscultation       Cardiovascular Exercise Tolerance: Poor METShypertension, Pt. on medications (-) CAD and (-) Past MI (-) dysrhythmias  Rhythm:Regular Rate:Normal - Systolic murmurs    Neuro/Psych  PSYCHIATRIC DISORDERS  Depression    Residual left weakness CVA, Residual Symptoms    GI/Hepatic ,GERD  Controlled,,(+)     (-) substance abuse    Endo/Other  diabetes    Renal/GU negative Renal ROS     Musculoskeletal   Abdominal   Peds  Hematology   Anesthesia Other Findings Past Medical History: No date: Anemia No date: Aortic atherosclerosis (HCC) No date: Asthma No date: Back pain No date: Bladder tumor No date: Cancer (HCC) No date: Carotid artery stenosis No date: Cataract No date: Cervical disc disorder No date: COPD (chronic obstructive pulmonary disease) (HCC) No date: Depression No date: Diabetes (HCC)     Comment:  type II No date: Diabetic neuropathy (HCC) No date: Dyspnea     Comment:  with exertion No date: GERD (gastroesophageal reflux disease) No date: Hematuria No date: History of kidney stones     Comment:  per patient "can't remember the year" No date: Hyperlipidemia No date: Hypertension     Comment:  per patient "take meds for it" No date: Insomnia No date: Neuropathy No date: Osteoarthritis No date:  Osteopenia 2018: Pneumonia     Comment:  per patient No date: Reflux No date: Rheumatoid arthritis (HCC) 2015: Stroke (HCC)     Comment:  and again 2017  - Weakness in left leg, staggers w/               walking, vision  No date: Tenosynovitis  Reproductive/Obstetrics                             Anesthesia Physical Anesthesia Plan  ASA: 3  Anesthesia Plan: General   Post-op Pain Management: Ofirmev IV (intra-op)* and Regional block*   Induction: Intravenous  PONV Risk Score and Plan: 2 and Ondansetron, Dexamethasone and Midazolam  Airway Management Planned: Oral ETT and Video Laryngoscope Planned  Additional Equipment: None  Intra-op Plan:   Post-operative Plan: Extubation in OR  Informed Consent: I have reviewed the patients History and Physical, chart, labs and discussed the procedure including the risks, benefits and alternatives for the proposed anesthesia with the patient or authorized representative who has indicated his/her understanding and acceptance.     Dental advisory given  Plan Discussed with: CRNA and Surgeon  Anesthesia Plan Comments: (Discussed risks of anesthesia with patient, including PONV, sore throat, lip/dental/eye damage. Rare risks discussed as well, such as cardiorespiratory and neurological sequelae, and allergic reactions. Discussed the role of CRNA in patient's perioperative care. Patient understands.  Discussed r/b/a of interscalene block, including elective nature. Risks discussed: - Rare: bleeding, infection, nerve damage - shortness of breath from hemidiaphragmatic paralysis - unilateral horner's  syndrome - poor/non-working blocks - reactions and toxicity to local anesthetic Patient understands and agrees.  Patient counseled on benefits of smoking cessation, and increased perioperative risks associated with continued smoking. )       Anesthesia Quick Evaluation

## 2023-04-10 NOTE — H&P (Signed)
History of Present Illness:  Kendra [name] is a 77 y.o. female who presents today as a result of referral from [facility], [place], for right shoulder pain.   The patient's symptoms began many years ago and developed without any specific cause or injury . The patient describes the symptoms as marked (major pain with significant limitations) and have the quality of being aching, constant, miserable, nagging, stabbing, tender, and throbbing. The pain is localized to the anterolateral aspect of the shoulder but will radiate down her arm to her elbow as well as up to the right side of her neck. She also notes difficulty turning her head from side-to-side due to her right-sided neck pain . These symptoms are aggravated constantly, with normal daily activities, with sleeping, carrying heavy objects, at higher levels of activity, with overhead activity, and reaching behind the back. She has tried acetaminophen and non-steroidal anti-inflammatories (Advil) with with limited benefit. She has tried rest and heat with limited benefit. She has tried several steroid injections over the years, but has not tried any formal physical therapy. The patient feels that her right-sided neck pain is actually more problematic on today's visit. She localizes the pain to the right side of her neck, but denies any radiation of pain down her arm to her hand, nor does she note any numbness or paresthesias down her arm to her hand. She is right-hand dominant.  This complaint is not work related. She is a sports non-participant.  Shoulder Surgical History:  The patient has had no shoulder surgery in the past.  PMH/PSH/Family History/Social History/Meds/Allergies:  I have reviewed past medical, surgical, social and family history, medications and allergies as documented in the EMR.  Current Outpatient Medications:  ACCU-CHEK SOFTCLIX LANCETS lancets  acetaminophen (TYLENOL) 650 MG ER tablet Take 2 capsules by mouth in the  morning, at noon, in the evening, and at bedtime.  albuterol (VENTOLIN HFA) 90 mcg/actuation inhaler Inhale 2 inhalations into the lungs INHALE 1-2 PUFFS BY MOUTH EVERY 6 HOURS AS NEEDED WHEEZING/ SHORTNESS OF BREATH  aspirin 81 MG EC tablet Take by mouth Take 1 tablet (81 mg total) by mouth daily. Swallow whole.  atorvastatin (LIPITOR) 40 MG tablet TAKE 1 TABLET BY MOUTH BEDTIME  buPROPion (WELLBUTRIN XL) 150 MG XL tablet Take 1 tablet by mouth once daily  clopidogreL (PLAVIX) 75 mg tablet Take by mouth Take 1 tablet (75 mg total) by mouth daily.  cyclobenzaprine (FLEXERIL) 5 MG tablet Take 5 mg by mouth 3 (three) times daily as needed  diazePAM (VALIUM) 5 MG tablet Take 1 tablet 30 minutes prior to MRI scan as needed for anxiety 1 tablet 0  fluticasone-umeclidinium-vilanterol (TRELEGY ELLIPTA) 100-62.5-25 mcg inhaler USE ONE INHALATION INTO THE LUNGS ONCE A DAY RINSE MOUTH AFTER EACH USE  ibuprofen (MOTRIN) 600 MG tablet  ketoconazole (NIZORAL) 2 % cream  lisinopriL (ZESTRIL) 5 MG tablet  metFORMIN (GLUCOPHAGE) 500 MG tablet  methocarbamoL (ROBAXIN) 500 MG tablet Take 1 tablet (500 mg total) by mouth 3 (three) times daily 30 tablet 2  nicotine (NICOTROL) 10 mg cartridge for inhalation  ondansetron (ZOFRAN) 4 MG tablet  pravastatin (PRAVACHOL) 20 MG tablet Take 1 tablet by mouth once daily  silver sulfADIAZINE (SSD) 1 % cream Apply topically  SITagliptin phos-metFORMIN (JANUMET XR) 100-1,000 mg ER 24 hr multiphase tablet  SITagliptin phos-metFORMIN (JANUMET XR) 50-1,000 mg ER 24 hr mutliphase tablet  traMADoL (ULTRAM) 50 mg tablet Take 1 tablet by mouth every 6 (six) hours as needed  traZODone (  DESYREL) 50 MG tablet Take 50 mg by mouth at bedtime  umeclidinium (INCRUSE ELLIPTA) 62.5 mcg/actuation DsDv inhalation unit  vancomycin (VANCOCIN) 250 MG capsule  zolpidem (AMBIEN) 5 MG tablet   Allergies:  Citalopram Nausea And Vomiting   Past Medical History:  Asthma without status  asthmaticus (HHS-HCC)  Cataract cortical, senile  COPD (chronic obstructive pulmonary disease) (CMS/HHS-HCC)  Depression  Diabetes mellitus type 2, uncomplicated (CMS/HHS-HCC)  Glaucoma (increased eye pressure)  History of stroke  2015 and 2017- weakness in the left leg  Hyperlipidemia  Hypertension  Neuropathy  Pneumonia 2018  Rheumatoid arthritis (CMS/HHS-HCC)   Past Surgical History:   ACDF 12/15/2008 (C5-6, C6-7. Diskectomy at C5-6 and C6-7. Insertion of interbody device BAK-C at C5-6. Insertion of interbody device BAK-C at C6-7)  ENDARTERECTOMY CAROTID RIGHT. Patch Angioplasty Using XenoSure Biologic Patch Right Carotid. Right 10/21/2019  Surgeon: Maeola Harman, MD  LEFT CAROTID ENDARTERECTOMY. Patch Angioplasty Left Carotid. Left 12/30/2019 (Surgeon: Maeola Harman MD)  BREAST EXCISIONAL BIOPSY Right (Fatty Tumor)  LEFT WRIST SURGERY  RIGHT RING FINGER   Family History:  Hyperlipidemia (Elevated cholesterol) Mother  Diabetes Mother  High blood pressure (Hypertension) Mother  Cataracts Mother  Breast cancer Mother  Diabetes type II Mother  Depression Father  Stroke Father  Heart murmur Son   Social History:   Socioeconomic History:  Marital status: Widowed  Tobacco Use  Smoking status: Every Day  Current packs/day: 0.25  Types: Cigarettes  Smokeless tobacco: Never  Vaping Use  Vaping status: Never Used  Substance and Sexual Activity  Alcohol use: No  Drug use: No  Sexual activity: Defer   Social Determinants of Health:   Financial Resource Strain: Medium Risk (01/04/2022)  Received from [facility]  Overall Financial Resource Strain (CARDIA)  Difficulty of Paying Living Expenses: Somewhat hard  Food Insecurity: No Food Insecurity (05/08/2022)  Received from [facility]  Hunger Vital Sign  Worried About Running Out of Food in the Last Year: Never true  Ran Out of Food in the Last Year: Never true  Transportation Needs: Unmet  Transportation Needs (08/09/2022)  Received from [facility] - Transportation  Lack of Transportation (Medical): Yes  Lack of Transportation (Non-Medical): Yes   Review of Systems:  A comprehensive 14 point ROS was performed, reviewed, and the pertinent orthopaedic findings are documented in the HPI.  Physical Exam:  Vitals:  03/26/23 1123  BP: 116/64  Weight: 57 kg (125 lb 9.6 oz)  Height: 165.1 cm (5\' 5" )  PainSc: 10-Worst pain ever  PainLoc: Shoulder   General/Constitutional: The patient appears to be well-nourished, well-developed, and in no acute distress. Neuro/Psych: Normal mood and affect, oriented to person, place and time. Eyes: Non-icteric. Pupils are equal, round, and reactive to light, and exhibit synchronous movement. ENT: Unremarkable. Lymphatic: No palpable adenopathy. Respiratory: Lungs clear to auscultation, Normal chest excursion, and Non-labored breathing. Mild end expiratory wheeze. Cardiovascular: Regular rate and rhythm. No murmurs. and No edema, swelling or tenderness, except as noted in detailed exam. Integumentary: No impressive skin lesions present, except as noted in detailed exam. Musculoskeletal: Unremarkable, except as noted in detailed exam.  Right shoulder exam: SKIN: normal SWELLING: none WARMTH: none LYMPH NODES: no adenopathy palpable CREPITUS: none TENDERNESS: Mildly tender over anterolateral shoulder ROM (active):  Forward flexion: 130 degrees Abduction: 140 degrees Internal rotation: L1 ROM (passive):  Forward flexion: 150 degrees Abduction: 150 degrees  ER/IR at 90 abd: 85 degrees / 45 degrees  She describes mild to moderate  pain with forward flexion, abduction, and internal rotation at 90 degrees of abduction.  STRENGTH: Forward flexion: 4-4+/5 Abduction: 4-4+/5 External rotation: 4-4+/5 Internal rotation: 4+/5 Pain with RC testing: Mild pain with resisted forward flexion and abduction.  STABILITY: Normal  SPECIAL  TESTS: Juanetta Gosling' test: positive, mild Speed's test: positive Capsulitis - pain w/ passive ER: no Crossed arm test: Mildly positive Crank: Not evaluated Anterior apprehension: Negative Posterior apprehension: Not evaluated  Cervical Spine:  Supple, mildly tender over the right paracervical muscles.  ROM: Flexion: 40 degrees Extension: 20 degrees Left/Right turn: 55 degrees / 45 degrees Left/Right tilt: 25 degrees / 15 degrees  She has moderate pain with right turn and right tilt. Neck motion does not reproduce the patient's shoulder symptoms. She has a negative Spurling's test bilaterally. She is neurovascularly intact to the right upper extremity.  Imaging:  Shoulder X-Ray Imaging: Recent true AP, Y-scapular, and axillary views of the right shoulder are available for review and have been reviewed by myself. These films demonstrate moderate degenerative changes as manifest by joint space narrowing and small osteophytes off both the humerus and inferior glenoid. The subacromial space is moderately decreased, primarily due to a significantly laterally downsloping acromion . There is no subacromial or infra-clavicular spurring. She demonstrates a Type II acromion.  Right Shoulder Imaging, MRI: MRI Shoulder Cartilage: Full thickness humeral head cartilage loss. Full thickness glenoid cartilage loss. MRI Shoulder Rotator Cuff: Moderate to severe tendinopathic changes involving the supraspinatus and infraspinatus tendons without obvious partial or full-thickness tearing. No retraction. MRI Shoulder Labrum / Biceps: Biceps tendinopathy. MRI Shoulder Bone: Normal bone.  Both the films and report were reviewed by myself and discussed with the patient.  Assessment:  1. Primary osteoarthritis of right shoulder.  2. Injury of tendon of long head of right biceps.  3. Rotator cuff tendinitis, right.  4. Cervical spondylosis.   Plan:  The treatment options were discussed with the patient. In  addition, patient educational materials were provided regarding the diagnosis and treatment options. The patient is quite frustrated by her symptoms and functional limitations, especially as they pertains to her right shoulder. She understands that she may have two processes contributing to her symptoms, including both the right shoulder pathology and her cervical pathology. Regardless, she would like to proceed with surgical intervention for her right shoulder to include a reverse right total shoulder arthroplasty. The procedure was discussed with the patient, as were the potential risks (including bleeding, infection, nerve and/or blood vessel injury, persistent or recurrent pain, loosening and/or failure of the components, dislocation, need for further surgery, blood clots, strokes, heart attacks and/or arhythmias, pneumonia, etc.) and benefits. The patient states her understanding and wishes to proceed.    H&P reviewed and patient re-examined. No changes.

## 2023-04-10 NOTE — TOC Progression Note (Addendum)
Transition of Care [facility]) - Progression Note    Patient Details  Name: Kendra [name] MRN: [id number] Date of Birth: 07-09-1946  [facility] of [facility] CM/SW Contact  Marlowe Sax, RN Phone Number: 04/10/2023, 11:52 AM  Clinical Narrative:    The patient is set up with [facility] services prior to Surgery by surgeons office    She requested a 3 in 1 to be delivered to her home, [employer] to deliver, however will jot likley be today but hopefully deliver tomorrow    Expected Discharge Plan and Services         Expected Discharge Date: 04/10/23                                     Social Determinants of Health (SDOH) Interventions SDOH Screenings   Food Insecurity: No Food Insecurity (05/08/2022)  Housing: Low Risk  (05/08/2022)  Transportation Needs: Unmet Transportation Needs (08/09/2022)  Utilities: Not At Risk (06/29/2022)  Alcohol Screen: Low Risk  (05/08/2022)  Depression (PHQ2-9): Low Risk  (04/04/2023)  Financial Resource Strain: Medium Risk (01/04/2022)  Physical Activity: Inactive (11/14/2021)  Social Connections: Socially Isolated (11/14/2021)  Stress: Stress Concern Present (01/04/2022)  Tobacco Use: High Risk (04/10/2023)  Health Literacy: Low Risk  (12/25/2020)   Received from [name]    Readmission Risk Interventions     No data to display

## 2023-04-10 NOTE — Evaluation (Signed)
Occupational Therapy Evaluation Patient Details Name: Kendra [name] MRN: [id number] DOB: 12/07/1945 Today's Date: 04/10/2023   History of Present Illness Karilynn Carranza is a 77 y/o F with PMH including DM2, apnea, HTN, RA, falls. Presents for planned reverse TSA on R shoulder.   Clinical Impression   Patient received seated in recliner. OT provided all education/training for s/p shoulder surgery, including donning/doffing sling, donning/doffing polarice, donning/doffing upper body clothing, positioning of arm, and movement/WB restrictions. Patient's family member participated in training and demonstrated understanding during session.   Patient with no further need for OT in acute care; discharge OT services.       Recommendations for follow up therapy are one component of a multi-disciplinary discharge planning process, led by the attending physician.  Recommendations may be updated based on patient status, additional functional criteria and insurance authorization.   Assistance Recommended at Discharge Intermittent Supervision/Assistance  Patient can return home with the following A little help with walking and/or transfers;A lot of help with bathing/dressing/bathroom;Assistance with cooking/housework;Direct supervision/assist for medications management;Direct supervision/assist for financial management;Assist for transportation;Help with stairs or ramp for entrance    Functional Status Assessment  Patient has had a recent decline in their functional status and demonstrates the ability to make significant improvements in function in a reasonable and predictable amount of time.  Equipment Recommendations  BSC/3in1    Recommendations for Other Services       Precautions / Restrictions Precautions Precautions: Fall;Shoulder Type of Shoulder Precautions: NWB; no active movement Shoulder Interventions: Shoulder sling/immobilizer;Shoulder abduction pillow;At all times Required Braces  or Orthoses: Sling Restrictions Weight Bearing Restrictions: Yes RUE Weight Bearing: Non weight bearing      Mobility Bed Mobility               General bed mobility comments: not tested; pt received in recliner    Transfers Overall transfer level: Needs assistance                 General transfer comment: pt stated that she's been transferring with son assist; declines t/f and mobility at this time.      Balance Overall balance assessment:  (not assessed at this time; pt stating she is at baseline; education provided on having sons provide CGA for balance assist given pt's hx of falls; pt to use cane in L hand and son guarding on R side around waist.)                                         ADL either performed or assessed with clinical judgement   ADL Overall ADL's : Needs assistance/impaired                 Upper Body Dressing : Maximal assistance;Sitting Upper Body Dressing Details (indicate cue type and reason): OT assisted pt and son with training on doffing and donning sling, polarice, and shirt. Son able to demonstrate donning sling and polarice and was present throughout for training. Recommendation provided for pt to wear button down shirts in the next few weeks for increased comfort   Lower Body Dressing Details (indicate cue type and reason): Pt already dressed in LB clothing when OT arrived   Toilet Transfer Details (indicate cue type and reason): Pt states she's been up to the bathroom with son assist.         Functional mobility during ADLs:  (pt declines functional  mobility at this time) General ADL Comments: Recommend supervision and assist for all ADLs. Dependent IADLs.     Vision         Perception     Praxis      Pertinent Vitals/Pain Pain Assessment Pain Assessment: No/denies pain (Pt states she is unable to feel her R shoulder and arm and hand)     Hand Dominance Right   Extremity/Trunk Assessment  Upper Extremity Assessment Upper Extremity Assessment: RUE deficits/detail RUE Deficits / Details: s/p rTSA on R   Lower Extremity Assessment Lower Extremity Assessment: Generalized weakness (not tested today; pt stating she's been walking to bathroom with son assist)       Communication Communication Communication: No difficulties   Cognition Arousal/Alertness: Awake/alert Behavior During Therapy: WFL for tasks assessed/performed Overall Cognitive Status: Within Functional Limits for tasks assessed                                       General Comments  pt on room air    Exercises     Shoulder Instructions      Home Living Family/patient expects to be discharged to:: Private residence Living Arrangements: Children Available Help at Discharge: Family;Available 24 hours/day Type of Home: House Home Access: Level entry     Home Layout: One level     Bathroom Shower/Tub: Sponge bathes at baseline   Bathroom Toilet: Standard     Home Equipment: Cane - single Librarian, academic (2 wheels) (her cane is a "hurry cane" with three small points)   Additional Comments: One son living in the home; another son lives nearby. Son present during evaluation today is the son who doesn't live with her.      Prior Functioning/Environment Prior Level of Function : Needs assist;History of Falls (last six months)             Mobility Comments: Pt with intermittent falls over the past few years; most recent fall was 2 weeks ago. Uses hurry cane in R hand at baseline ADLs Comments: Pt is (I) with ADLs, sponge bathes at baseline; Sons assist with transportation and grocery shopping and household needs (meals, laundry, etc). Pt enjoys watching TV.        OT Problem List: Decreased range of motion;Impaired UE functional use      OT Treatment/Interventions:      OT Goals(Current goals can be found in the care plan section) Acute Rehab OT Goals Patient Stated  Goal: Go home OT Goal Formulation: All assessment and education complete, DC therapy  OT Frequency:      Co-evaluation              AM-PAC OT "6 Clicks" Daily Activity     Outcome Measure Help from another person eating meals?: A Little Help from another person taking care of personal grooming?: A Little Help from another person toileting, which includes using toliet, bedpan, or urinal?: A Little Help from another person bathing (including washing, rinsing, drying)?: A Lot Help from another person to put on and taking off regular upper body clothing?: A Lot Help from another person to put on and taking off regular lower body clothing?: A Lot 6 Click Score: 15   End of Session Equipment Utilized During Treatment:  (polarice; abduction brace) Nurse Communication: Mobility status;Other (comment) (need for BSC at d/c.)  Activity Tolerance: Patient tolerated treatment well Patient left: in chair;with call  bell/phone within reach;with family/visitor present  OT Visit Diagnosis: Other (comment) (s/p R shoulder surgery)                Time: 1610-9604 OT Time Calculation (min): 30 min Charges:  OT General Charges $OT Visit: 1 Visit OT Evaluation $OT Eval Moderate Complexity: 1 Mod OT Treatments $Self Care/Home Management : 8-22 mins  Linward Foster, MS, OTR/L  Alvester Morin 04/10/2023, 2:56 PM

## 2023-04-10 NOTE — Transfer of Care (Signed)
Immediate Anesthesia Transfer of Care Note  Patient: Kendra [name]  Procedure(s) Performed: REVERSE SHOULDER ARTHROPLASTY WITH BICEPS TENODESIS (Right: Shoulder)  Patient Location: PACU  Anesthesia Type:General  Level of Consciousness: drowsy  Airway & Oxygen Therapy: Patient Spontanous Breathing and Patient connected to face mask oxygen  Post-op Assessment: Report given to RN and Post -op Vital signs reviewed and stable  Post vital signs: Reviewed  Last Vitals:  Vitals Value Taken Time  BP 151/81 04/10/23 1008  Temp 18F   Pulse 70 04/10/23 1013  Resp 21 04/10/23 1013  SpO2 100 % 04/10/23 1013  Vitals shown include unfiled device data.  Last Pain:  Vitals:   04/10/23 0636  TempSrc: Temporal  PainSc: 0-No pain         Complications: No notable events documented.

## 2023-04-11 ENCOUNTER — Other Ambulatory Visit: Payer: Self-pay

## 2023-04-11 ENCOUNTER — Emergency Department: Payer: 59

## 2023-04-11 ENCOUNTER — Observation Stay
Admission: EM | Admit: 2023-04-11 | Discharge: 2023-04-12 | Disposition: A | Discharge status: Home / Self Care | Payer: 59 | Source: Home / Self Care | Attending: Emergency Medicine | Admitting: Emergency Medicine

## 2023-04-11 DIAGNOSIS — R42 Dizziness and giddiness: Principal | ICD-10-CM

## 2023-04-11 DIAGNOSIS — E860 Dehydration: Secondary | ICD-10-CM | POA: Diagnosis not present

## 2023-04-11 DIAGNOSIS — R0689 Other abnormalities of breathing: Secondary | ICD-10-CM | POA: Diagnosis not present

## 2023-04-11 DIAGNOSIS — F17210 Nicotine dependence, cigarettes, uncomplicated: Secondary | ICD-10-CM | POA: Diagnosis not present

## 2023-04-11 DIAGNOSIS — Z96611 Presence of right artificial shoulder joint: Secondary | ICD-10-CM | POA: Insufficient documentation

## 2023-04-11 DIAGNOSIS — Z556 Problems related to health literacy: Secondary | ICD-10-CM | POA: Diagnosis not present

## 2023-04-11 DIAGNOSIS — F32A Depression, unspecified: Secondary | ICD-10-CM | POA: Diagnosis present

## 2023-04-11 DIAGNOSIS — R000 Tachycardia, unspecified: Secondary | ICD-10-CM

## 2023-04-11 DIAGNOSIS — M47812 Spondylosis without myelopathy or radiculopathy, cervical region: Secondary | ICD-10-CM | POA: Diagnosis not present

## 2023-04-11 DIAGNOSIS — Z7982 Long term (current) use of aspirin: Secondary | ICD-10-CM | POA: Diagnosis not present

## 2023-04-11 DIAGNOSIS — I499 Cardiac arrhythmia, unspecified: Secondary | ICD-10-CM | POA: Diagnosis not present

## 2023-04-11 DIAGNOSIS — E119 Type 2 diabetes mellitus without complications: Secondary | ICD-10-CM | POA: Insufficient documentation

## 2023-04-11 DIAGNOSIS — I69398 Other sequelae of cerebral infarction: Secondary | ICD-10-CM | POA: Diagnosis not present

## 2023-04-11 DIAGNOSIS — J449 Chronic obstructive pulmonary disease, unspecified: Secondary | ICD-10-CM | POA: Insufficient documentation

## 2023-04-11 DIAGNOSIS — Z743 Need for continuous supervision: Secondary | ICD-10-CM | POA: Diagnosis not present

## 2023-04-11 DIAGNOSIS — M25511 Pain in right shoulder: Secondary | ICD-10-CM | POA: Insufficient documentation

## 2023-04-11 DIAGNOSIS — E1140 Type 2 diabetes mellitus with diabetic neuropathy, unspecified: Secondary | ICD-10-CM | POA: Diagnosis not present

## 2023-04-11 DIAGNOSIS — H409 Unspecified glaucoma: Secondary | ICD-10-CM | POA: Diagnosis not present

## 2023-04-11 DIAGNOSIS — Z79899 Other long term (current) drug therapy: Secondary | ICD-10-CM | POA: Insufficient documentation

## 2023-04-11 DIAGNOSIS — R531 Weakness: Secondary | ICD-10-CM

## 2023-04-11 DIAGNOSIS — Z7951 Long term (current) use of inhaled steroids: Secondary | ICD-10-CM | POA: Diagnosis not present

## 2023-04-11 DIAGNOSIS — Z471 Aftercare following joint replacement surgery: Secondary | ICD-10-CM | POA: Diagnosis not present

## 2023-04-11 DIAGNOSIS — E785 Hyperlipidemia, unspecified: Secondary | ICD-10-CM | POA: Diagnosis not present

## 2023-04-11 DIAGNOSIS — I10 Essential (primary) hypertension: Secondary | ICD-10-CM | POA: Diagnosis not present

## 2023-04-11 DIAGNOSIS — E1136 Type 2 diabetes mellitus with diabetic cataract: Secondary | ICD-10-CM | POA: Diagnosis not present

## 2023-04-11 DIAGNOSIS — J45909 Unspecified asthma, uncomplicated: Secondary | ICD-10-CM | POA: Insufficient documentation

## 2023-04-11 DIAGNOSIS — Z1152 Encounter for screening for COVID-19: Secondary | ICD-10-CM | POA: Diagnosis not present

## 2023-04-11 DIAGNOSIS — I2510 Atherosclerotic heart disease of native coronary artery without angina pectoris: Secondary | ICD-10-CM | POA: Diagnosis not present

## 2023-04-11 DIAGNOSIS — R059 Cough, unspecified: Secondary | ICD-10-CM | POA: Diagnosis not present

## 2023-04-11 DIAGNOSIS — Z5982 Transportation insecurity: Secondary | ICD-10-CM | POA: Diagnosis not present

## 2023-04-11 DIAGNOSIS — M069 Rheumatoid arthritis, unspecified: Secondary | ICD-10-CM | POA: Diagnosis not present

## 2023-04-11 DIAGNOSIS — J4489 Other specified chronic obstructive pulmonary disease: Secondary | ICD-10-CM | POA: Diagnosis not present

## 2023-04-11 DIAGNOSIS — R6889 Other general symptoms and signs: Secondary | ICD-10-CM | POA: Diagnosis not present

## 2023-04-11 LAB — BASIC METABOLIC PANEL
Anion gap: 10 (ref 5–15)
BUN: 20 mg/dL (ref 8–23)
CO2: 24 mmol/L (ref 22–32)
Calcium: 9 mg/dL (ref 8.9–10.3)
Chloride: 103 mmol/L (ref 98–111)
Creatinine, Ser: 0.89 mg/dL (ref 0.44–1.00)
GFR, Estimated: >60 mL/min (ref >60)
Glucose, Bld: 214 mg/dL — ABNORMAL HIGH (ref 70–99)
Potassium: 3.7 mmol/L (ref 3.5–5.1)
Sodium: 137 mmol/L (ref 135–145)

## 2023-04-11 LAB — URINALYSIS, ROUTINE W REFLEX MICROSCOPIC
Bacteria, UA: NONE SEEN
Bilirubin Urine: NEGATIVE
Glucose, UA: 50 mg/dL — AB
Hgb urine dipstick: NEGATIVE
Ketones, ur: NEGATIVE mg/dL
Leukocytes,Ua: NEGATIVE
Nitrite: NEGATIVE
Protein, ur: 30 mg/dL — AB
Specific Gravity, Urine: 1.023 (ref 1.005–1.030)
pH: 5 (ref 5.0–8.0)

## 2023-04-11 LAB — CBC
HCT: 35.3 % — ABNORMAL LOW (ref 36.0–46.0)
Hemoglobin: 11.4 g/dL — ABNORMAL LOW (ref 12.0–15.0)
MCH: 30.3 pg (ref 26.0–34.0)
MCHC: 32.3 g/dL (ref 30.0–36.0)
MCV: 93.9 fL (ref 80.0–100.0)
Platelets: 227 10*3/uL (ref 150–400)
RBC: 3.76 MIL/uL — ABNORMAL LOW (ref 3.87–5.11)
RDW: 14.1 % (ref 11.5–15.5)
WBC: 9.3 10*3/uL (ref 4.0–10.5)
nRBC: 0 % (ref 0.0–0.2)

## 2023-04-11 LAB — RESP PANEL BY RT-PCR (RSV, FLU A&B, COVID)  RVPGX2
Influenza A by PCR: NEGATIVE
Influenza B by PCR: NEGATIVE
Resp Syncytial Virus by PCR: NEGATIVE
SARS Coronavirus 2 by RT PCR: NEGATIVE

## 2023-04-11 LAB — TROPONIN I (HIGH SENSITIVITY): Troponin I (High Sensitivity): 7 ng/L (ref <18)

## 2023-04-11 MED ORDER — OXYCODONE-ACETAMINOPHEN 5-325 MG PO TABS
1.0000 | ORAL_TABLET | Freq: Once | ORAL | Status: AC
  Administered 2023-04-11: 1 via ORAL
  Filled 2023-04-11: qty 1

## 2023-04-11 MED ORDER — SODIUM CHLORIDE 0.9 % IV BOLUS
1000.0000 mL | Freq: Once | INTRAVENOUS | Status: AC
  Administered 2023-04-11: 1000 mL via INTRAVENOUS

## 2023-04-11 NOTE — ED Provider Notes (Incomplete)
[name] Provider Note    Event Date/Time   First MD Initiated Contact with Patient 04/11/23 2104     (approximate)   History   Weakness and Tremors   HPI  Kendra [name] is a 77 y.o. female past medical history significant for COPD, diabetes, GERD, recent right shoulder surgery yesterday, who presents to the emergency department with generalized weakness, shoulder pain and neck stiffness.  Patient states that she has had worsening pain to her right shoulder and her neck since surgery yesterday.  States that she was unable to get any pain medications that were prescribed to her and was having difficulty getting to the pharmacy since she does not have a car and her son does not have a car.  States that today she had worsening generalized weakness and fatigue.  Denies any falls.  Denies any headache or change in vision.  Denies any focal upper or lower extremity weakness.  Denies any numbness or tingling.  Denies any chest pain or shortness of breath.  Denies any abdominal pain, nausea, vomiting or diarrhea.  Denies any blood in her stool.  No dysuria, urinary urgency or frequency.  Denies significant cough.  No prior history of DVT or PE.  Decreased p.o. intake today.     Physical Exam   Triage Vital Signs: ED Triage Vitals  Encounter Vitals Group     BP 04/11/23 2048 134/69     Systolic BP Percentile --      Diastolic BP Percentile --      Pulse Rate 04/11/23 2048 (!) 104     Resp 04/11/23 2048 20     Temp 04/11/23 2048 99.2 F (37.3 C)     Temp Source 04/11/23 2048 Oral     SpO2 04/11/23 2048 93 %     Weight --      Height --      Head Circumference --      Peak Flow --      Pain Score 04/11/23 2049 10     Pain Loc --      Pain Education --      Exclude from Growth Chart --     Most recent vital signs: Vitals:   04/11/23 2048  BP: 134/69  Pulse: (!) 104  Resp: 20  Temp: 99.2 F (37.3 C)  SpO2: 93%    Physical Exam Constitutional:       Appearance: She is well-developed.  HENT:     Head: Atraumatic.  Eyes:     Conjunctiva/sclera: Conjunctivae normal.  Cardiovascular:     Rate and Rhythm: Regular rhythm. Tachycardia present.  Pulmonary:     Effort: No respiratory distress.     Breath sounds: No wheezing or rhonchi.  Abdominal:     General: There is no distension.     Tenderness: There is no abdominal tenderness. There is no guarding.  Musculoskeletal:        General: Normal range of motion.     Cervical back: Normal range of motion.     Comments: Right arm immobilizer in place  Skin:    General: Skin is warm.     Capillary Refill: Capillary refill takes less than 2 seconds.  Neurological:     Mental Status: She is alert. Mental status is at baseline.     GCS: GCS eye subscore is 4. GCS verbal subscore is 5. GCS motor subscore is 6.     Cranial Nerves: Cranial nerves 2-12 are intact.  Sensory: Sensation is intact.     Motor: Motor function is intact.     Comments: Able to ambulate in the emergency department, mildly unsteady gait     IMPRESSION / MDM / ASSESSMENT AND PLAN / ED COURSE  I reviewed the triage vital signs and the nursing notes.  Differential diagnosis including infectious process, dehydration, ACS, pneumonia, electrolyte abnormality, urinary tract infection, COVID.  Consider pulmonary embolism given that she is postop with some tachycardia however she is not having any shortness of breath or chest pain, have a lower suspicion for pulmonary embolism.  Less likely to cause her generalized weakness.  EKG  I, Corena Herter, the attending physician, personally viewed and interpreted this ECG.   Rate:108  Rhythm: Sinus tachycardia  Axis: Normal  Intervals: Normal  ST&T Change: None Significant underlying artifact  Sinus tachycardia while on cardiac telemetry.  RADIOLOGY I independently reviewed imaging, my interpretation of imaging: Chest x-ray with no signs of pneumonia  LABS (all  labs ordered are listed, but only abnormal results are displayed) Labs interpreted as -    Labs Reviewed  BASIC METABOLIC PANEL - Abnormal; Notable for the following components:      Result Value   Glucose, Bld 214 (*)    All other components within normal limits  CBC - Abnormal; Notable for the following components:   RBC 3.76 (*)    Hemoglobin 11.4 (*)    HCT 35.3 (*)    All other components within normal limits  RESP PANEL BY RT-PCR (RSV, FLU A&B, COVID)  RVPGX2  URINALYSIS, ROUTINE W REFLEX MICROSCOPIC  TROPONIN I (HIGH SENSITIVITY)     MDM    No significant electrolyte abnormalities.  No significant leukocytosis.  COVID and influenza testing are negative.  No findings of urinary tract infection.  Troponin is negative.  Patient given 1 L of IV fluids  On reevaluation patient attempted to ambulate, normally walks with a cane, unable to ambulate with generalized weakness, unstable gait.   PROCEDURES:  Critical Care performed: No  Procedures  Patient's presentation is most consistent with {EM COPA:27473}   MEDICATIONS ORDERED IN ED: Medications - No data to display  FINAL CLINICAL IMPRESSION(S) / ED DIAGNOSES   Final diagnoses:  None     Rx / DC Orders   ED Discharge Orders     None        Note:  This document was prepared using Dragon voice recognition software and may include unintentional dictation errors.

## 2023-04-11 NOTE — ED Triage Notes (Signed)
Pt presents to ER with c/o weakness x3 days, and tremors that started yesterday.  Pt is s/p right shoulder arthroplasty done at this hospital yesterday.  Pt states she was prescribed pain meds, but has trouble with transportation and picking up these pain meds from the pharmacy.  Pt states weakness is more in her legs than anywhere else, and states she had a fall asa recent as 7/18.  Pt does live at home with her son.  Pt denies any fevers, chills, n/v/d, or chest pain, but states she did develop a cough today.  Pt otherwise A&O x4 and in NAD.

## 2023-04-11 NOTE — ED Notes (Signed)
Assisted to toilet, unsteady gait. Urine sample collected. Assisted back to stretcher, call bell in reach, stretcher locked in lowest position.

## 2023-04-11 NOTE — ED Notes (Signed)
See triage note, pt reports generalized weakness worsening after surgery yesterday. Denies recent falls. NAD noted. Alert and oriented.

## 2023-04-11 NOTE — ED Provider Notes (Signed)
[facility] Provider Note    Event Date/Time   First MD Initiated Contact with Patient 04/11/23 2104     (approximate)   History   Weakness and Tremors   HPI  Kendra [name] is a 77 y.o. female past medical history significant for COPD, diabetes, GERD, recent right shoulder surgery yesterday, who presents to the emergency department with generalized weakness, shoulder pain and neck stiffness.  Patient states that she has had worsening pain to her right shoulder and her neck since surgery yesterday.  States that she was unable to get any pain medications that were prescribed to her and was having difficulty getting to the pharmacy since she does not have a car and her son does not have a car.  States that today she had worsening generalized weakness and fatigue.  Denies any falls.  Denies any headache or change in vision.  Denies any focal upper or lower extremity weakness.  Denies any numbness or tingling.  Denies any chest pain or shortness of breath.  Denies any abdominal pain, nausea, vomiting or diarrhea.  Denies any blood in her stool.  No dysuria, urinary urgency or frequency.  Denies significant cough.  No prior history of DVT or PE.  Decreased p.o. intake today.     Physical Exam   Triage Vital Signs: ED Triage Vitals  Encounter Vitals Group     BP 04/11/23 2048 134/69     Systolic BP Percentile --      Diastolic BP Percentile --      Pulse Rate 04/11/23 2048 (!) 104     Resp 04/11/23 2048 20     Temp 04/11/23 2048 99.2 F (37.3 C)     Temp Source 04/11/23 2048 Oral     SpO2 04/11/23 2048 93 %     Weight --      Height --      Head Circumference --      Peak Flow --      Pain Score 04/11/23 2049 10     Pain Loc --      Pain Education --      Exclude from Growth Chart --     Most recent vital signs: Vitals:   04/11/23 2330 04/11/23 2345  BP: 130/75   Pulse: (!) 103 (!) 104  Resp:    Temp:    SpO2: 91% 92%    Physical  Exam Constitutional:      Appearance: She is well-developed.  HENT:     Head: Atraumatic.  Eyes:     Conjunctiva/sclera: Conjunctivae normal.  Cardiovascular:     Rate and Rhythm: Regular rhythm. Tachycardia present.  Pulmonary:     Effort: No respiratory distress.     Breath sounds: No wheezing or rhonchi.  Abdominal:     General: There is no distension.     Tenderness: There is no abdominal tenderness. There is no guarding.  Musculoskeletal:        General: Normal range of motion.     Cervical back: Normal range of motion.     Comments: Right arm immobilizer in place  Skin:    General: Skin is warm.     Capillary Refill: Capillary refill takes less than 2 seconds.  Neurological:     Mental Status: She is alert. Mental status is at baseline.     GCS: GCS eye subscore is 4. GCS verbal subscore is 5. GCS motor subscore is 6.     Cranial Nerves:  Cranial nerves 2-12 are intact.     Sensory: Sensation is intact.     Motor: Motor function is intact.     Gait: Gait abnormal.     Comments: Unsteady gait.     IMPRESSION / MDM / ASSESSMENT AND PLAN / ED COURSE  I reviewed the triage vital signs and the nursing notes.  Differential diagnosis including infectious process, dehydration, ACS, pneumonia, electrolyte abnormality, urinary tract infection, COVID.  Consider pulmonary embolism given that she is postop with some tachycardia however she is not having any shortness of breath or chest pain, have a lower suspicion for pulmonary embolism.  Less likely to cause her generalized weakness.  EKG  I, Corena Herter, the attending physician, personally viewed and interpreted this ECG.   Rate:108  Rhythm: Sinus tachycardia  Axis: Normal  Intervals: Normal  ST&T Change: None Significant underlying artifact  Sinus tachycardia while on cardiac telemetry.  RADIOLOGY I independently reviewed imaging, my interpretation of imaging: Chest x-ray with no signs of pneumonia  LABS (all labs  ordered are listed, but only abnormal results are displayed) Labs interpreted as -    Labs Reviewed  BASIC METABOLIC PANEL - Abnormal; Notable for the following components:      Result Value   Glucose, Bld 214 (*)    All other components within normal limits  CBC - Abnormal; Notable for the following components:   RBC 3.76 (*)    Hemoglobin 11.4 (*)    HCT 35.3 (*)    All other components within normal limits  URINALYSIS, ROUTINE W REFLEX MICROSCOPIC - Abnormal; Notable for the following components:   Color, Urine YELLOW (*)    APPearance HAZY (*)    Glucose, UA 50 (*)    Protein, ur 30 (*)    All other components within normal limits  RESP PANEL BY RT-PCR (RSV, FLU A&B, COVID)  RVPGX2  TROPONIN I (HIGH SENSITIVITY)     MDM    No significant electrolyte abnormalities.  No significant leukocytosis.  COVID and influenza testing are negative.  No findings of urinary tract infection.  Troponin is negative.  No focal deficits on exam, have low suspicion and clinical picture is not consistent with CVA.  No headache, have a low suspicion for intracranial hemorrhage.  No falls or trauma.  Continues to have no chest pain or shortness of breath, have a low suspicion for pulmonary embolism or ACS.  Patient given 1 L of IV fluids  On reevaluation patient attempted to ambulate, normally walks with a cane, unable to ambulate with generalized weakness, unstable gait.  Orthostatic blood pressures was positive for hypotension.  Generalized weakness.  Clinical picture concerning for dehydration.  Consulted hospitalist for dehydration associated with weakness.    PROCEDURES:  Critical Care performed: No  Procedures  Patient's presentation is most consistent with acute presentation with potential threat to life or bodily function.   MEDICATIONS ORDERED IN ED: Medications  sodium chloride 0.9 % bolus 1,000 mL (1,000 mLs Intravenous New Bag/Given 04/11/23 2153)  oxyCODONE-acetaminophen  (PERCOCET/ROXICET) 5-325 MG per tablet 1 tablet (1 tablet Oral Given 04/11/23 2159)    FINAL CLINICAL IMPRESSION(S) / ED DIAGNOSES   Final diagnoses:  None     Rx / DC Orders   ED Discharge Orders     None        Note:  This document was prepared using Dragon voice recognition software and may include unintentional dictation errors.   Corena Herter, MD 04/12/23 [id number]

## 2023-04-12 ENCOUNTER — Observation Stay: Payer: 59

## 2023-04-12 DIAGNOSIS — E860 Dehydration: Secondary | ICD-10-CM | POA: Diagnosis not present

## 2023-04-12 DIAGNOSIS — E785 Hyperlipidemia, unspecified: Secondary | ICD-10-CM

## 2023-04-12 DIAGNOSIS — R42 Dizziness and giddiness: Secondary | ICD-10-CM

## 2023-04-12 DIAGNOSIS — R000 Tachycardia, unspecified: Secondary | ICD-10-CM

## 2023-04-12 DIAGNOSIS — R918 Other nonspecific abnormal finding of lung field: Secondary | ICD-10-CM | POA: Diagnosis not present

## 2023-04-12 DIAGNOSIS — I2510 Atherosclerotic heart disease of native coronary artery without angina pectoris: Secondary | ICD-10-CM | POA: Diagnosis not present

## 2023-04-12 DIAGNOSIS — R7989 Other specified abnormal findings of blood chemistry: Secondary | ICD-10-CM | POA: Diagnosis not present

## 2023-04-12 DIAGNOSIS — R531 Weakness: Secondary | ICD-10-CM

## 2023-04-12 DIAGNOSIS — R55 Syncope and collapse: Secondary | ICD-10-CM | POA: Diagnosis not present

## 2023-04-12 LAB — GLUCOSE, CAPILLARY
Glucose-Capillary: 138 mg/dL — ABNORMAL HIGH (ref 70–99)
Glucose-Capillary: 202 mg/dL — ABNORMAL HIGH (ref 70–99)
Glucose-Capillary: 157 mg/dL — ABNORMAL HIGH (ref 70–99)

## 2023-04-12 LAB — CBC
HCT: 34.3 % — ABNORMAL LOW (ref 36.0–46.0)
Hemoglobin: 11.1 g/dL — ABNORMAL LOW (ref 12.0–15.0)
MCH: 30.1 pg (ref 26.0–34.0)
MCHC: 32.4 g/dL (ref 30.0–36.0)
MCV: 93 fL (ref 80.0–100.0)
Platelets: 221 10*3/uL (ref 150–400)
RBC: 3.69 MIL/uL — ABNORMAL LOW (ref 3.87–5.11)
RDW: 14.2 % (ref 11.5–15.5)
WBC: 9.7 10*3/uL (ref 4.0–10.5)
nRBC: 0 % (ref 0.0–0.2)

## 2023-04-12 LAB — BASIC METABOLIC PANEL
Anion gap: 6 (ref 5–15)
BUN: 19 mg/dL (ref 8–23)
CO2: 26 mmol/L (ref 22–32)
Calcium: 9.2 mg/dL (ref 8.9–10.3)
Chloride: 106 mmol/L (ref 98–111)
Creatinine, Ser: 0.82 mg/dL (ref 0.44–1.00)
GFR, Estimated: >60 mL/min (ref >60)
Glucose, Bld: 172 mg/dL — ABNORMAL HIGH (ref 70–99)
Potassium: 4.2 mmol/L (ref 3.5–5.1)
Sodium: 138 mmol/L (ref 135–145)

## 2023-04-12 LAB — D-DIMER, QUANTITATIVE: D-Dimer, Quant: 1.09 ug/mL-FEU — ABNORMAL HIGH (ref 0.00–0.50)

## 2023-04-12 MED ORDER — UMECLIDINIUM BROMIDE 62.5 MCG/ACT IN AEPB
1.0000 | INHALATION_SPRAY | Freq: Every day | RESPIRATORY_TRACT | Status: DC
  Administered 2023-04-12: 1 via RESPIRATORY_TRACT
  Filled 2023-04-12: qty 7

## 2023-04-12 MED ORDER — MAGNESIUM HYDROXIDE 400 MG/5ML PO SUSP: 30.0000 mL | Freq: Every day | ORAL | Status: DC | PRN

## 2023-04-12 MED ORDER — ALBUTEROL SULFATE HFA 108 (90 BASE) MCG/ACT IN AERS: 1.0000 | INHALATION_SPRAY | Freq: Four times a day (QID) | RESPIRATORY_TRACT | Status: DC | PRN

## 2023-04-12 MED ORDER — FLUTICASONE FUROATE-VILANTEROL 100-25 MCG/ACT IN AEPB
1.0000 | INHALATION_SPRAY | Freq: Every day | RESPIRATORY_TRACT | Status: DC
  Administered 2023-04-12: 1 via RESPIRATORY_TRACT
  Filled 2023-04-12: qty 28

## 2023-04-12 MED ORDER — BUPROPION HCL ER (XL) 150 MG PO TB24
150.0000 mg | ORAL_TABLET | Freq: Every day | ORAL | Status: DC
  Administered 2023-04-12: 150 mg via ORAL
  Filled 2023-04-12: qty 1

## 2023-04-12 MED ORDER — INSULIN ASPART 100 UNIT/ML IJ SOLN: 0.0000 [IU] | Freq: Every day | INTRAMUSCULAR | Status: DC

## 2023-04-12 MED ORDER — ASPIRIN 81 MG PO TBEC
81.0000 mg | DELAYED_RELEASE_TABLET | Freq: Every day | ORAL | Status: DC
  Administered 2023-04-12: 81 mg via ORAL
  Filled 2023-04-12: qty 1

## 2023-04-12 MED ORDER — ONDANSETRON HCL 4 MG PO TABS: 4.0000 mg | ORAL_TABLET | Freq: Four times a day (QID) | ORAL | Status: DC | PRN

## 2023-04-12 MED ORDER — SILVER SULFADIAZINE 1 % EX CREA: 1.0000 | TOPICAL_CREAM | Freq: Every day | CUTANEOUS | Status: DC | PRN

## 2023-04-12 MED ORDER — ONDANSETRON HCL 4 MG/2ML IJ SOLN: 4.0000 mg | Freq: Four times a day (QID) | INTRAMUSCULAR | Status: DC | PRN

## 2023-04-12 MED ORDER — ACETAMINOPHEN 325 MG PO TABS: 650.0000 mg | ORAL_TABLET | Freq: Four times a day (QID) | ORAL | Status: DC | PRN

## 2023-04-12 MED ORDER — CALCIUM CARBONATE 1250 (500 CA) MG PO TABS
1250.0000 mg | ORAL_TABLET | Freq: Every day | ORAL | Status: DC
  Administered 2023-04-12: 1250 mg via ORAL
  Filled 2023-04-12: qty 1

## 2023-04-12 MED ORDER — ACETAMINOPHEN 650 MG RE SUPP: 650.0000 mg | Freq: Four times a day (QID) | RECTAL | Status: DC | PRN

## 2023-04-12 MED ORDER — TRAZODONE HCL 50 MG PO TABS: 50.0000 mg | ORAL_TABLET | Freq: Every day | ORAL | Status: DC

## 2023-04-12 MED ORDER — SODIUM CHLORIDE 0.9 % IV SOLN: INTRAVENOUS | Status: DC

## 2023-04-12 MED ORDER — OXYCODONE HCL 5 MG PO TABS: 5.0000 mg | ORAL_TABLET | ORAL | Status: DC | PRN

## 2023-04-12 MED ORDER — TRAZODONE HCL 50 MG PO TABS: 25.0000 mg | ORAL_TABLET | Freq: Every evening | ORAL | Status: DC | PRN

## 2023-04-12 MED ORDER — ENOXAPARIN SODIUM 40 MG/0.4ML IJ SOSY
40.0000 mg | PREFILLED_SYRINGE | INTRAMUSCULAR | Status: DC
  Administered 2023-04-12: 40 mg via SUBCUTANEOUS
  Filled 2023-04-12: qty 0.4

## 2023-04-12 MED ORDER — INSULIN ASPART 100 UNIT/ML IJ SOLN
0.0000 [IU] | Freq: Three times a day (TID) | INTRAMUSCULAR | Status: DC
  Administered 2023-04-12: 3 [IU] via SUBCUTANEOUS
  Administered 2023-04-12: 2 [IU] via SUBCUTANEOUS
  Administered 2023-04-12: 5 [IU] via SUBCUTANEOUS
  Filled 2023-04-12 (×3): qty 1

## 2023-04-12 MED ORDER — ALBUTEROL SULFATE (2.5 MG/3ML) 0.083% IN NEBU: 2.5000 mg | INHALATION_SOLUTION | Freq: Four times a day (QID) | RESPIRATORY_TRACT | Status: DC | PRN

## 2023-04-12 MED ORDER — ATORVASTATIN CALCIUM 20 MG PO TABS: 40.0000 mg | ORAL_TABLET | Freq: Every day | ORAL | Status: DC

## 2023-04-12 MED ORDER — IOHEXOL 350 MG/ML SOLN
75.0000 mL | Freq: Once | INTRAVENOUS | Status: AC | PRN
  Administered 2023-04-12: 56 mL via INTRAVENOUS

## 2023-04-12 NOTE — Progress Notes (Signed)
OT Cancellation Note  Patient Details Name: Kendra [name] MRN: [id number] DOB: 09/07/1946   Cancelled Treatment:    Reason Eval/Treat Not Completed: Other (comment). Consult received. Chart Reviewed. Pending Chest CT Results. When results received will evaluate as medically appropriate. Will check back at later time/date.   Arman Filter., MPH, MS, OTR/L ascom [phone] 04/12/23, 2:34 PM

## 2023-04-12 NOTE — H&P (Addendum)
[place]   PATIENT NAME: Kendra [name]    MR#:  [id number]  DATE OF BIRTH:  06-Nov-1945  DATE OF ADMISSION:  04/11/2023  PRIMARY CARE PHYSICIAN: [name], NP   Patient is coming from: Home  REQUESTING/REFERRING PHYSICIAN: Corena Herter, MD  CHIEF COMPLAINT:   Chief Complaint  Patient presents with   Weakness   Tremors    HISTORY OF PRESENT ILLNESS:  Kendra [name] is a 77 y.o. African-American female with medical history significant for asthma, COPD, type 2 diabetes mellitus, hypertension, dyslipidemia, diabetic neuropathy and GERD, who presented to the emergency room with acute onset of generalized fatigue and weakness for the last couple of days.  She had a right shoulder surgery yesterday.  She has not been having much appetite since then.  Today she got up from sitting position and almost had a presyncope.  She denies any paresthesias or focal muscle weakness.  No nausea or vomiting or diarrhea or abdominal pain.  No chest pain or palpitations.  No fever or chills.  No headache or dizziness or blurred vision.  ED Course: When the patient came to the ER, temperature was 99.2 and heart rate was 104 with otherwise normal vital signs.  Labs revealed blood glucose of 214 with otherwise normal BMP and CBC showed mild anemia.  Influenza antigens, COVID-19 PCR and RSV PCR came back negative.  UA showed 50 glucose and 30 protein and was otherwise unremarkable. EKG as reviewed by me : Twelve-lead EKG showed sinus tachycardia with rate of 108 with probable LVH and prolonged QT interval with QTc of 504 MS. Imaging: Portable chest x-ray showed no acute cardiopulmonary disease.  The patient was given 1 L bolus of IV normal saline and 1 p.o. Percocet.  She will be admitted to an observation medical telemetry bed for further evaluation and management. PAST MEDICAL HISTORY:   Past Medical History:  Diagnosis Date   Anemia    Aortic atherosclerosis (HCC)    Asthma    Back  pain    Bladder tumor    Cancer (HCC)    Carotid artery stenosis    Cataract    Cervical disc disorder    COPD (chronic obstructive pulmonary disease) (HCC)    Depression    Diabetes (HCC)    type II   Diabetic neuropathy (HCC)    Dyspnea    with exertion   GERD (gastroesophageal reflux disease)    Hematuria    History of kidney stones    per patient "can't remember the year"   Hyperlipidemia    Hypertension    per patient "take meds for it"   Insomnia    Neuropathy    Osteoarthritis    Osteopenia    Pneumonia 2018   per patient   Reflux    Rheumatoid arthritis (HCC)    Stroke (HCC) 2015   and again 2017  - Weakness in left leg, staggers w/ walking, vision    Tenosynovitis     PAST SURGICAL HISTORY:   Past Surgical History:  Procedure Laterality Date   BLADDER INSTILLATION N/A 06/23/2022   Procedure: BLADDER INSTILLATION OF GEMCITABINE;  Surgeon: Sondra Come, MD;  Location: [facility] ORS;  Service: Urology;  Laterality: N/A;   BREAST BIOPSY Right    Fatty Tumor   ENDARTERECTOMY Right 10/21/2019   Procedure: ENDARTERECTOMY CAROTID RIGHT;  Surgeon: Maeola Harman, MD;  Location: [name] OR;  Service: Vascular;  Laterality: Right;   ENDARTERECTOMY Left  12/30/2019   Procedure: LEFT CAROTID ENDARTERECTOMY;  Surgeon: Maeola Harman, MD;  Location: [facility] OR;  Service: Vascular;  Laterality: Left;   NECK SURGERY     OVARIAN CYST REMOVAL     PATCH ANGIOPLASTY Right 10/21/2019   Procedure: Patch Angioplasty Using XenoSure Biologic Patch Right Carotid;  Surgeon: Maeola Harman, MD;  Location: [facility] OR;  Service: Vascular;  Laterality: Right;   PATCH ANGIOPLASTY Left 12/30/2019   Procedure: Patch Angioplasty Left Carotid;  Surgeon: Maeola Harman, MD;  Location: [facility] OR;  Service: Vascular;  Laterality: Left;   REVERSE SHOULDER ARTHROPLASTY Right 04/10/2023   Procedure: REVERSE SHOULDER ARTHROPLASTY WITH BICEPS TENODESIS;  Surgeon: Christena Flake, MD;   Location: [facility] ORS;  Service: Orthopedics;  Laterality: Right;  MAKE SECOND CASE OF THE DAY   TRANSURETHRAL RESECTION OF BLADDER TUMOR N/A 06/23/2022   Procedure: TRANSURETHRAL RESECTION OF BLADDER TUMOR (TURBT);  Surgeon: Sondra Come, MD;  Location: [facility] ORS;  Service: Urology;  Laterality: N/A;    SOCIAL HISTORY:   Social History   Tobacco Use   Smoking status: Every Day    Current packs/day: 0.25    Average packs/day: 0.3 packs/day for 61.0 years (15.3 ttl pk-yrs)    Types: Cigarettes    Passive exposure: Current   Smokeless tobacco: Former    Types: Snuff  Substance Use Topics   Alcohol use: No    FAMILY HISTORY:   Family History  Problem Relation Age of Onset   Stroke Father    Depression Father    Diabetes Mother    Hyperlipidemia Mother    Breast cancer Mother    Heart murmur Son    Heart murmur Son    Heart murmur Son     DRUG ALLERGIES:   Allergies  Allergen Reactions   Citalopram Nausea And Vomiting    REVIEW OF SYSTEMS:   ROS As per history of present illness. All pertinent systems were reviewed above. Constitutional, HEENT, cardiovascular, respiratory, GI, GU, musculoskeletal, neuro, psychiatric, endocrine, integumentary and hematologic systems were reviewed and are otherwise negative/unremarkable except for positive findings mentioned above in the HPI.   MEDICATIONS AT HOME:   Prior to Admission medications   Medication Sig Start Date End Date Taking? Authorizing Provider  Acetaminophen (TYLENOL 8 HOUR ARTHRITIS PAIN PO) Take 2 capsules by mouth in the morning, at noon, in the evening, and at bedtime.    [provider]  albuterol (VENTOLIN HFA) 108 (90 Base) MCG/ACT inhaler Inhale 1-2 puffs into the lungs every 6 (six) hours as needed. INHALE 1-2 PUFFS BY MOUTH EVERY 6 HOURS AS NEEDED WHEEZING/ SHORTNESS OF BREATH 01/02/23   [name], NP  aspirin EC 81 MG tablet Take 1 tablet (81 mg total) by mouth daily. Swallow whole. 07/18/21    [name], NP  atorvastatin (LIPITOR) 40 MG tablet TAKE 1 TABLET BY MOUTH BEDTIME 01/03/23   [name], NP  buPROPion (WELLBUTRIN XL) 150 MG 24 hr tablet TAKE 1 TABLET BY MOUTH ONCE DAILY ** NEED TO KEEP UPCOMING APPOINTMENT!!1 01/02/23   [name], NP  Calcium Carbonate (CALCIUM 600 PO) Take 1 tablet by mouth daily.    [provider]  Fluticasone-Umeclidin-Vilant (TRELEGY ELLIPTA) 100-62.5-25 MCG/ACT AEPB Inhale 1 puff into the lungs at bedtime. 01/02/23   [name], NP  oxyCODONE (ROXICODONE) 5 MG immediate release tablet Take 1-2 tablets (5-10 mg total) by mouth every 4 (four) hours as needed for moderate pain or severe  pain. 04/10/23   Poggi, Excell Seltzer, MD  silver sulfADIAZINE (SILVADENE) 1 % cream Apply 1 Application topically daily. 01/02/23   [name], NP  traZODone (DESYREL) 50 MG tablet TAKE 1 TABLET BY MOUTH AT BEDTIME 03/29/23   [name], NP      VITAL SIGNS:  Blood pressure 109/72, pulse (!) 104, temperature 99.2 F (37.3 C), temperature source Oral, resp. rate (!) 22, SpO2 92%.  PHYSICAL EXAMINATION:  Physical Exam  GENERAL:  77 y.o.-year-old African-American female patient lying in the bed with no acute distress.  EYES: Pupils equal, round, reactive to light and accommodation. No scleral icterus. Extraocular muscles intact.  HEENT: Head atraumatic, normocephalic. Oropharynx and nasopharynx clear.  NECK:  Supple, no jugular venous distention. No thyroid enlargement, no tenderness.  LUNGS: Normal breath sounds bilaterally, no wheezing, rales,rhonchi or crepitation. No use of accessory muscles of respiration.  CARDIOVASCULAR: Regular rate and rhythm, S1, S2 normal. No murmurs, rubs, or gallops.  ABDOMEN: Soft, nondistended, nontender. Bowel sounds present. No organomegaly or mass.  EXTREMITIES: No pedal edema, cyanosis, or clubbing.  NEUROLOGIC: Cranial nerves II through XII are intact. Muscle strength 5/5 in all extremities. Sensation  intact. Gait not checked.  PSYCHIATRIC: The patient is alert and oriented x 3.  Normal affect and good eye contact. SKIN: No obvious rash, lesion, or ulcer.   LABORATORY PANEL:   CBC Recent Labs  Lab 04/11/23 2055  WBC 9.3  HGB 11.4*  HCT 35.3*  PLT 227   ------------------------------------------------------------------------------------------------------------------  Chemistries  Recent Labs  Lab 04/11/23 2055  NA 137  K 3.7  CL 103  CO2 24  GLUCOSE 214*  BUN 20  CREATININE 0.89  CALCIUM 9.0   ------------------------------------------------------------------------------------------------------------------  Cardiac Enzymes No results for input(s): "TROPONINI" in the last 168 hours. ------------------------------------------------------------------------------------------------------------------  RADIOLOGY:  DG Chest Portable 1 View  Result Date: 04/11/2023 CLINICAL DATA:  Cough EXAM: PORTABLE CHEST 1 VIEW COMPARISON:  Chest x-ray 04/02/2022 FINDINGS: The heart size and mediastinal contours are within normal limits. Both lungs are clear. Surgical changes overlie the cervical spine and neck. Right shoulder arthroplasty appears new from prior. Skin staples are still present. IMPRESSION: No active disease. Electronically Signed   By: Darliss Cheney M.D.   On: 04/11/2023 22:04   DG Shoulder Right Port  Result Date: 04/10/2023 CLINICAL DATA:  Status post reverse shoulder arthroplasty, right. EXAM: RIGHT SHOULDER - 1 VIEW COMPARISON:  None Available. FINDINGS: Reverse shoulder arthroplasty in expected alignment. No periprosthetic lucency or fracture. Recent postsurgical change includes air and edema in the joint space. Skin staples in place. IMPRESSION: Reverse shoulder arthroplasty without immediate postoperative complication. Electronically Signed   By: Narda Rutherford M.D.   On: 04/10/2023 13:22   [place] OR NERVE BLOCK-IMAGE ONLY [facility])  Result Date: 04/10/2023 There is no  interpretation for this exam.  This order is for images obtained during a surgical procedure.  Please See "Surgeries" Tab for more information regarding the procedure.      IMPRESSION AND PLAN:  Assessment and Plan: * Postural dizziness with presyncope - The patient be admitted to an observation medical telemetry bed. - We will follow orthostatics. - This like secondary to volume depletion and orthostatic hypotension.  She has been having associated generalized weakness - We will continue hydration with IV normal saline.  Dehydration - This is likely the culprit for #1. - Management as above.  Dyslipidemia - We will continue statin therapy.  Sinus tachycardia - We will add D-dimer.  Depression - We will continue trazodone and Wellbutrin XL.    DVT prophylaxis: Lovenox.  Advanced Care Planning:  Code Status: The patient is DNRand DNI.  This was discussed with her. Family Communication:  The plan of care was discussed in details with the patient (and family). I answered all questions. The patient agreed to proceed with the above mentioned plan. Further management will depend upon hospital course. Disposition Plan: Back to previous home environment Consults called: none.  All the records are reviewed and case discussed with ED provider.  Status is: Observation  I certify that at the time of admission, it is my clinical judgment that the patient will require  hospital care extending less than 2 midnights.                            Dispo: The patient is from: Home              Anticipated d/c is to: Home              Patient currently is not medically stable to d/c.              Difficult to place patient: No  Hannah Beat M.D on 04/12/2023 at 12:54 AM  [facility]   From 7 PM-7 AM, contact night-coverage [email or web address]  CC: Primary care physician; [name], NP

## 2023-04-12 NOTE — Assessment & Plan Note (Signed)
-   We will continue trazodone and Wellbutrin XL.

## 2023-04-12 NOTE — Assessment & Plan Note (Addendum)
-   The patient be admitted to an observation medical telemetry bed. - We will follow orthostatics. - This like secondary to volume depletion and orthostatic hypotension.  She has been having associated generalized weakness - We will continue hydration with IV normal saline.

## 2023-04-12 NOTE — Assessment & Plan Note (Signed)
-   We will add D-dimer.

## 2023-04-12 NOTE — Assessment & Plan Note (Signed)
-   We will continue statin therapy. 

## 2023-04-12 NOTE — Plan of Care (Signed)

## 2023-04-12 NOTE — Plan of Care (Signed)
Patient discharged per MD orders at this time.All dc instructions, education and medications reviewed with the patient.Pt expressed understanding and will comply with dc instructions.follow up appointments was also communicated to the Pt.no verbal c/o or any ssx of distress.Pt was dc home with HH/PT/OT services per order.patient was transported home by a commercial transportation vehicle.

## 2023-04-12 NOTE — Assessment & Plan Note (Signed)
-  This is likely the culprit for #1. - Management as above.

## 2023-04-12 NOTE — Care Management Obs Status (Signed)
MEDICARE OBSERVATION STATUS NOTIFICATION   Patient Details  Name: Kendra [name] MRN: [id number] Date of Birth: [birth year]   Medicare Observation Status Notification Given:  Yes    Garret Reddish, RN 04/12/2023, 3:59 PM

## 2023-04-12 NOTE — Progress Notes (Signed)
Pt newly admitted to unit. Denies pain. Ambulated with one assist to bathroom. VSS. Denies shortness of breath. Educated on fall risks, belongings policy and oriented to room and call light system.

## 2023-04-12 NOTE — TOC Transition Note (Signed)
Transition of Care [name]) - CM/SW Discharge Note   Patient Details  Name: Kendra [name] MRN: [id number] Date of Birth: Feb 10, 1946  Transition of Care [facility] CM/SW Contact:  Garret Reddish, RN Phone Number: 04/12/2023, 3:53 PM   Clinical Narrative:   Chart reviewed.  Patient will be a discharge for today.    I have spoken with Mrs. Collet.  She reports that prior to admission she lived with her son at home.  She reports that she currently is active with [facility].  She reports that she would like to use their services on discharge. I have informed [place] with [facility] that patient will be a discharge for today. Home Health Services will include PT and OT.    Mrs. Stagner reports that she has a rolling walker and a cane at home.    Mrs. Ancona reports that she will need a cab voucher home today.    I have explained that she is currently not meeting in patient status and is currently under observation status.  She verbalizes understanding.    I have informed staff nurse of the above information.     Final next level of care: Home w Home Health Services Barriers to Discharge: No Barriers Identified   Patient Goals and CMS Choice CMS Medicare.gov Compare Post Acute Care list provided to:: Patient Choice offered to / list presented to : Patient  Discharge Placement                         Discharge Plan and Services Additional resources added to the After Visit Summary for                  DME Arranged:  (Patient has all needed DME.  Patient has rollig walker and cane at home.)         HH Arranged: PT, OT HH Agency: [facility] Date [facility] Agency Contacted: 04/12/23 Time HH Agency Contacted: 1530 Representative spoke with at [name] Agency: [place]  Social Determinants of Health (SDOH) Interventions SDOH Screenings   Food Insecurity: No Food Insecurity (04/12/2023)  Housing: Low Risk  (04/12/2023)  Transportation Needs: Unmet  Transportation Needs (04/12/2023)  Utilities: Not At Risk (04/12/2023)  Alcohol Screen: Low Risk  (05/08/2022)  Depression (PHQ2-9): Low Risk  (04/04/2023)  Financial Resource Strain: Medium Risk (01/04/2022)  Physical Activity: Inactive (11/14/2021)  Social Connections: Socially Isolated (11/14/2021)  Stress: Stress Concern Present (01/04/2022)  Tobacco Use: High Risk (04/11/2023)  Health Literacy: Low Risk  (12/25/2020)   Received from [facility], [facility]     Readmission Risk Interventions     No data to display

## 2023-04-12 NOTE — Discharge Summary (Signed)
Physician Discharge Summary   Patient: Kendra [name] MRN: [id number] DOB: 02-08-1946  Admit date:     04/11/2023  Discharge date: 04/12/23  Discharge Physician: Enedina Finner   PCP: [name], NP   Recommendations at discharge:   F/u PCP in 1 week Pt advised to keep herself hydrated F/u Dr Joice Lofts on your post-op scheduled appt   Discharge Diagnoses: Principal Problem:   Postural dizziness with presyncope Active Problems:   Dehydration   Sinus tachycardia   Dyslipidemia   Depression   Weakness  Kendra [name] is a 77 y.o. African-American female with medical history significant for asthma, COPD, type 2 diabetes mellitus, hypertension, dyslipidemia, diabetic neuropathy and GERD, who presented to the emergency room with acute onset of generalized fatigue and weakness for the last couple of days.  She had a right shoulder surgery yesterday.  She has not been having much appetite since then.  Today she got up from sitting position and almost had a presyncope.   Orthostatic Hypotension/Dizziness Post op right shoulder surgery on 04/10/23 --secondary to volume depletion and orthostatic hypotension.  She has been having associated generalized weakness  --received IVF and feeling a lot better. --BP stable and orthostasis resolved --pt worked with therapy. Will set up [facility] PT/OT --pt overall feels ok and eager to go home. Spoke with son Ephriam Knuckles on the phone and updated  Tachycardia --improved --d-dimer elevated. CT chest negative for PE  Hyperlipidemia --cont statins  Depression --Cont wellbutrin  D/c home. TOC to help with cab voucher       Pain control - [facility] Controlled Substance Reporting System database was reviewed. and patient was instructed, not to drive, operate heavy machinery, perform activities at heights, swimming or participation in water activities or provide baby-sitting services while on Pain, Sleep and Anxiety Medications; until their  outpatient Physician has advised to do so again. Also recommended to not to take more than prescribed Pain, Sleep and Anxiety Medications.  Disposition: Home Diet recommendation:  Discharge Diet Orders (From admission, onward)     Start     Ordered   04/12/23 0000  Diet - low sodium heart healthy        04/12/23 1527           Cardiac diet DISCHARGE MEDICATION: Allergies as of 04/12/2023       Reactions   Citalopram Nausea And Vomiting        Medication List     TAKE these medications    albuterol 108 (90 Base) MCG/ACT inhaler Commonly known as: VENTOLIN HFA Inhale 1-2 puffs into the lungs every 6 (six) hours as needed. INHALE 1-2 PUFFS BY MOUTH EVERY 6 HOURS AS NEEDED WHEEZING/ SHORTNESS OF BREATH   aspirin EC 81 MG tablet Take 1 tablet (81 mg total) by mouth daily. Swallow whole.   atorvastatin 40 MG tablet Commonly known as: LIPITOR TAKE 1 TABLET BY MOUTH BEDTIME   buPROPion 150 MG 24 hr tablet Commonly known as: WELLBUTRIN XL TAKE 1 TABLET BY MOUTH ONCE DAILY ** NEED TO KEEP UPCOMING APPOINTMENT!!1   CALCIUM 600 PO Take 1 tablet by mouth daily.   oxyCODONE 5 MG immediate release tablet Commonly known as: Roxicodone Take 1-2 tablets (5-10 mg total) by mouth every 4 (four) hours as needed for moderate pain or severe pain.   silver sulfADIAZINE 1 % cream Commonly known as: Silvadene Apply 1 Application topically daily.   traZODone 50 MG tablet Commonly known as: DESYREL TAKE 1 TABLET BY  MOUTH AT BEDTIME   Trelegy Ellipta 100-62.5-25 MCG/ACT Aepb Generic drug: Fluticasone-Umeclidin-Vilant Inhale 1 puff into the lungs at bedtime.   TYLENOL 8 HOUR ARTHRITIS PAIN PO Take 2 capsules by mouth in the morning, at noon, in the evening, and at bedtime.        Follow-up Information     [name], NP. Schedule an appointment as soon as possible for a visit in 1 week(s).   Specialties: Internal Medicine, Emergency Medicine Contact information: [address] [place] [address] [phone]         Poggi, Excell Seltzer, MD. Go to.   Specialty: Orthopedic Surgery Why: on your scheduled post-op follow up Contact information: [address] [facility] [place] [address] [phone]                Discharge Exam: Alert and o x3 Right shoulder sling+ CV s1s2 normal not murmur Resp: clear to auscultation Neuro--non focal Condition at discharge: fair  The results of significant diagnostics from this hospitalization (including imaging, microbiology, ancillary and laboratory) are listed below for reference.   Imaging Studies: CT Angio Chest Pulmonary Embolism (PE) W or WO Contrast  Result Date: 04/12/2023 CLINICAL DATA:  Positive D-dimer, high clinical suspicion for PE, recent surgery EXAM: CT ANGIOGRAPHY CHEST WITH CONTRAST TECHNIQUE: Multidetector CT imaging of the chest was performed using the standard protocol during bolus administration of intravenous contrast. Multiplanar CT image reconstructions and MIPs were obtained to evaluate the vascular anatomy. RADIATION DOSE REDUCTION: This exam was performed according to the departmental dose-optimization program which includes automated exposure control, adjustment of the mA and/or kV according to patient size and/or use of iterative reconstruction technique. CONTRAST:  56mL OMNIPAQUE IOHEXOL 350 MG/ML SOLN COMPARISON:  Previous chest radiograph done on 04/11/2023, CT done on 02/04/2021 FINDINGS: Cardiovascular: There are no intraluminal filling defects in pulmonary artery branches. There is homogeneous enhancement in thoracic aorta. There are scattered atherosclerotic plaques and calcifications in thoracic aorta. Coronary artery calcifications are seen. Mediastinum/Nodes: No significant lymphadenopathy is seen. Lungs/Pleura: There is no focal pulmonary consolidation. Small patchy infiltrates are seen in the posterior lower lung fields, more so on the right side. There is  no significant pleural effusion or pneumothorax. Upper Abdomen: Arterial calcifications are seen. There are few small calcific densities in both kidneys. Musculoskeletal: There is reverse arthroplasty in right shoulder. There are pockets of air in the soft tissues around the right shoulder from recent surgery. Review of the MIP images confirms the above findings. IMPRESSION: There is no evidence of pulmonary embolism. There is no evidence of thoracic aortic dissection. Scattered calcifications are noted in thoracic aorta and coronary arteries. Small linear patchy infiltrates are seen in the posterior lower lung fields suggesting subsegmental atelectasis. There are multiple small calcifications in visualized portions of both kidneys suggesting renal stones and possibly arterial calcifications. There is recent right shoulder arthroplasty. Electronically Signed   By: Ernie Avena M.D.   On: 04/12/2023 14:35   DG Chest Portable 1 View  Result Date: 04/11/2023 CLINICAL DATA:  Cough EXAM: PORTABLE CHEST 1 VIEW COMPARISON:  Chest x-ray 04/02/2022 FINDINGS: The heart size and mediastinal contours are within normal limits. Both lungs are clear. Surgical changes overlie the cervical spine and neck. Right shoulder arthroplasty appears new from prior. Skin staples are still present. IMPRESSION: No active disease. Electronically Signed   By: Darliss Cheney M.D.   On: 04/11/2023 22:04   DG Shoulder Right Port  Result Date:  04/10/2023 CLINICAL DATA:  Status post reverse shoulder arthroplasty, right. EXAM: RIGHT SHOULDER - 1 VIEW COMPARISON:  None Available. FINDINGS: Reverse shoulder arthroplasty in expected alignment. No periprosthetic lucency or fracture. Recent postsurgical change includes air and edema in the joint space. Skin staples in place. IMPRESSION: Reverse shoulder arthroplasty without immediate postoperative complication. Electronically Signed   By: Narda Rutherford M.D.   On: 04/10/2023 13:22   [place] OR  NERVE BLOCK-IMAGE ONLY [facility])  Result Date: 04/10/2023 There is no interpretation for this exam.  This order is for images obtained during a surgical procedure.  Please See "Surgeries" Tab for more information regarding the procedure.    Microbiology: Results for orders placed or performed during the hospital encounter of 04/11/23  Resp panel by RT-PCR (RSV, Flu A&B, Covid) Anterior Nasal Swab     Status: None   Collection Time: 04/11/23  9:01 PM   Specimen: Anterior Nasal Swab  Result Value Ref Range Status   SARS Coronavirus 2 by RT PCR NEGATIVE NEGATIVE Final    Comment: (NOTE) SARS-CoV-2 target nucleic acids are NOT DETECTED.  The SARS-CoV-2 RNA is generally detectable in upper respiratory specimens during the acute phase of infection. The lowest concentration of SARS-CoV-2 viral copies this assay can detect is 138 copies/mL. A negative result does not preclude SARS-Cov-2 infection and should not be used as the sole basis for treatment or other patient management decisions. A negative result may occur with  improper specimen collection/handling, submission of specimen other than nasopharyngeal swab, presence of viral mutation(s) within the areas targeted by this assay, and inadequate number of viral copies(<138 copies/mL). A negative result must be combined with clinical observations, patient history, and epidemiological information. The expected result is Negative.  Fact Sheet for Patients:  BloggerCourse.com  Fact Sheet for Healthcare Providers:  SeriousBroker.it  This test is no t yet approved or cleared by the [place] FDA and  has been authorized for detection and/or diagnosis of SARS-CoV-2 by FDA under an Emergency Use Authorization (EUA). This EUA will remain  in effect (meaning this test can be used) for the duration of the COVID-19 declaration under Section 564(b)(1) of the Act, 21 U.S.C.section 360bbb-3(b)(1),  unless the authorization is terminated  or revoked sooner.       Influenza A by PCR NEGATIVE NEGATIVE Final   Influenza B by PCR NEGATIVE NEGATIVE Final    Comment: (NOTE) The Xpert Xpress SARS-CoV-2/FLU/RSV plus assay is intended as an aid in the diagnosis of influenza from Nasopharyngeal swab specimens and should not be used as a sole basis for treatment. Nasal washings and aspirates are unacceptable for Xpert Xpress SARS-CoV-2/FLU/RSV testing.  Fact Sheet for Patients: BloggerCourse.com  Fact Sheet for Healthcare Providers: SeriousBroker.it  This test is not yet approved or cleared by the [place] FDA and has been authorized for detection and/or diagnosis of SARS-CoV-2 by FDA under an Emergency Use Authorization (EUA). This EUA will remain in effect (meaning this test can be used) for the duration of the COVID-19 declaration under Section 564(b)(1) of the Act, 21 U.S.C. section 360bbb-3(b)(1), unless the authorization is terminated or revoked.     Resp Syncytial Virus by PCR NEGATIVE NEGATIVE Final    Comment: (NOTE) Fact Sheet for Patients: BloggerCourse.com  Fact Sheet for Healthcare Providers: SeriousBroker.it  This test is not yet approved or cleared by the [place] FDA and has been authorized for detection and/or diagnosis of SARS-CoV-2 by FDA under an Emergency Use Authorization (EUA). This EUA will remain  in effect (meaning this test can be used) for the duration of the COVID-19 declaration under Section 564(b)(1) of the Act, 21 U.S.C. section 360bbb-3(b)(1), unless the authorization is terminated or revoked.  Performed at [facility], [address] [place], [place] [address]    *Note: Due to a large number of results and/or encounters for the requested time period, some results have not been displayed. A complete set of results can be  found in Results Review.    Labs: CBC: Recent Labs  Lab 04/11/23 2055 04/12/23 0150  WBC 9.3 9.7  HGB 11.4* 11.1*  HCT 35.3* 34.3*  MCV 93.9 93.0  PLT 227 221   Basic Metabolic Panel: Recent Labs  Lab 04/11/23 2055 04/12/23 0150  NA 137 138  K 3.7 4.2  CL 103 106  CO2 24 26  GLUCOSE 214* 172*  BUN 20 19  CREATININE 0.89 0.82  CALCIUM 9.0 9.2   CBG: Recent Labs  Lab 04/10/23 0622 04/10/23 1013 04/12/23 0807 04/12/23 1114  GLUCAP 129* 206* 138* 202*    Discharge time spent: greater than 30 minutes.  Signed: Enedina Finner, MD [facility] 04/12/2023

## 2023-04-12 NOTE — Evaluation (Signed)
Occupational Therapy Evaluation Patient Details Name: Kendra [name] MRN: [id number] DOB: Mar 18, 1946 Today's Date: 04/12/2023   History of Present Illness 77 y.o. African-American female with medical history significant for asthma, COPD, type 2 diabetes mellitus, hypertension, dyslipidemia, diabetic neuropathy and GERD, who presented to the emergency room with acute onset of generalized fatigue and weakness for the last couple of days.  She had a right reverse TSA on 04/10/23.   Clinical Impression   Pt was seen for OT evaluation this date. Prior to hospital admission, pt was home for ~1 day from shoulder surgery before coming back to [facility] ED for weakness. Pt lives with her son who had been assisted with ADL and IADL as needed since surgery. Pt notes she was sleeping in the recliner. Pt endorses some pain in R shoulder but unable to rate and notes she was premedicated. Pt denies symptoms with positional changes during session. (Earlier orthostatics negative) Pt presents to acute OT demonstrating impaired ADL performance and functional mobility 2/2 decreased strength, ROM, UE functional use, and balance (See OT problem list for additional functional deficits). Pt currently requires MIN A For ADL transfers + HHA for mobility, and MAX A for UB ADL. OT adjusted sling for improved fit and positioning while educating pt on proper fit. Pt verbalized understanding. Pt would benefit from skilled OT services to address noted impairments and functional limitations (see below for any additional details) in order to maximize safety and independence while minimizing falls risk and caregiver burden. MD notified.    Recommendations for follow up therapy are one component of a multi-disciplinary discharge planning process, led by the attending physician.  Recommendations may be updated based on patient status, additional functional criteria and insurance authorization.   Assistance Recommended at Discharge Frequent or  constant Supervision/Assistance  Patient can return home with the following A little help with walking and/or transfers;A lot of help with bathing/dressing/bathroom;Assistance with cooking/housework;Assist for transportation;Help with stairs or ramp for entrance;Direct supervision/assist for medications management    Functional Status Assessment  Patient has had a recent decline in their functional status and demonstrates the ability to make significant improvements in function in a reasonable and predictable amount of time.  Equipment Recommendations  None recommended by OT    Recommendations for Other Services       Precautions / Restrictions Precautions Precautions: Fall;Shoulder Type of Shoulder Precautions: NWB; no active movement Shoulder Interventions: Shoulder sling/immobilizer;Shoulder abduction pillow;At all times Precaution Booklet Issued: No Required Braces or Orthoses: Sling Restrictions Weight Bearing Restrictions: Yes RUE Weight Bearing: Non weight bearing      Mobility Bed Mobility Overal bed mobility: Needs Assistance Bed Mobility: Rolling, Supine to Sit Rolling: Min guard   Supine to sit: Min assist, HOB elevated     General bed mobility comments: Min A for trunk support    Transfers Overall transfer level: Needs assistance Equipment used: 1 person hand held assist Transfers: Sit to/from Stand Sit to Stand: Min guard                  Balance Overall balance assessment: Needs assistance Sitting-balance support: No upper extremity supported, Feet supported Sitting balance-Leahy Scale: Fair     Standing balance support: Single extremity supported Standing balance-Leahy Scale: Fair                             ADL either performed or assessed with clinical judgement   ADL Overall ADL's : Needs  assistance/impaired                 Upper Body Dressing : Maximal assistance;Sitting   Lower Body Dressing: Minimal  assistance;Sit to/from stand   Toilet Transfer: Hydrographic surveyor Details (indicate cue type and reason): HHA           General ADL Comments: Pt continues to require significant assist for ADL at this time. Pt notes that son is able to provide needed level of assist.     Vision         Perception     Praxis      Pertinent Vitals/Pain Pain Assessment Pain Assessment: 0-10 Pain Location: R shoulder - pt endorses pain but doesn't rate Pain Intervention(s): Premedicated before session, Repositioned     Hand Dominance Right   Extremity/Trunk Assessment Upper Extremity Assessment Upper Extremity Assessment: Generalized weakness;RUE deficits/detail RUE Deficits / Details: s/p rTSA on R RUE: Unable to fully assess due to immobilization;Unable to fully assess due to pain;Shoulder pain at rest   Lower Extremity Assessment Lower Extremity Assessment: Generalized weakness       Communication Communication Communication: No difficulties   Cognition Arousal/Alertness: Awake/alert Behavior During Therapy: WFL for tasks assessed/performed Overall Cognitive Status: Within Functional Limits for tasks assessed                                       General Comments       Exercises Other Exercises Other Exercises: OT adjusted sling for improved fit and positioning while educating pt on proper fit.   Shoulder Instructions      Home Living Family/patient expects to be discharged to:: Private residence Living Arrangements: Children Available Help at Discharge: Family;Available 24 hours/day Type of Home: House Home Access: Level entry     Home Layout: One level     Bathroom Shower/Tub: Sponge bathes at baseline   Bathroom Toilet: Standard     Home Equipment: Cane - single Librarian, academic (2 wheels);BSC/3in1 (her cane is a "hurry cane" with three small points)   Additional Comments: One son living in the home; another son lives nearby. Pt  notes that neither son has a car and she took an [place] home from the surgery.      Prior Functioning/Environment Prior Level of Function : Needs assist;History of Falls (last six months)             Mobility Comments: Pt with intermittent falls over the past few years; most recent fall was 2 weeks ago. Uses hurry cane in R hand at baseline. Pt notes she was using her cane at home. ADLs Comments: Pt is (I) with ADLs, sponge bathes at baseline; Sons assist with transportation and grocery shopping and household needs (meals, laundry, etc). Pt enjoys watching TV. Since return home from surgery, pt notes son has helped her with ADL and IADL.        OT Problem List: Decreased range of motion;Impaired UE functional use;Decreased strength;Impaired balance (sitting and/or standing);Decreased knowledge of use of DME or AE      OT Treatment/Interventions: Self-care/ADL training;Therapeutic exercise;Therapeutic activities;Patient/family education;DME and/or AE instruction;Balance training;Manual therapy    OT Goals(Current goals can be found in the care plan section) Acute Rehab OT Goals Patient Stated Goal: go home to son OT Goal Formulation: With patient Time For Goal Achievement: 04/26/23 Potential to Achieve Goals: Good ADL Goals Pt Will Perform Upper  Body Dressing: with caregiver independent in assisting;sitting Additional ADL Goal #1: Pt will independently instruct family/caregiver in polar care mgt Additional ADL Goal #2: Pt will independently instruct family/caregiver in shoulder sling mgt  OT Frequency: Min 1X/week    Co-evaluation              AM-PAC OT "6 Clicks" Daily Activity     Outcome Measure Help from another person eating meals?: A Little Help from another person taking care of personal grooming?: A Little Help from another person toileting, which includes using toliet, bedpan, or urinal?: A Lot Help from another person bathing (including washing, rinsing, drying)?:  A Lot Help from another person to put on and taking off regular upper body clothing?: A Lot Help from another person to put on and taking off regular lower body clothing?: A Lot 6 Click Score: 14   End of Session Nurse Communication: Other (comment) (NT - new gown)  Activity Tolerance: Patient tolerated treatment well Patient left: in chair;with call bell/phone within reach;with chair alarm set;Other (comment) (MD)  OT Visit Diagnosis: Other abnormalities of gait and mobility (R26.89);Pain Pain - Right/Left: Right Pain - part of body: Shoulder                Time: 1610-9604 OT Time Calculation (min): 18 min Charges:  OT General Charges $OT Visit: 1 Visit OT Evaluation $OT Eval Low Complexity: 1 Low OT Treatments $Self Care/Home Management : 8-22 mins  Arman Filter., MPH, MS, OTR/L ascom [phone] 04/12/23, 3:27 PM

## 2023-04-12 NOTE — Progress Notes (Signed)
PT Cancellation Note  Patient Details Name: Kendra [name] MRN: [id number] DOB: 08-24-1946   Cancelled Treatment:    Reason Eval/Treat Not Completed: Other (comment); PT orders received. Chart Reviewed. Pending Chest CT Results. When results received will evaluate as medically appropriate. Will check back at later time/date.    Creed Copper Fairly, PT, DPT 04/12/23 2:12 PM

## 2023-04-13 DIAGNOSIS — Z471 Aftercare following joint replacement surgery: Secondary | ICD-10-CM | POA: Diagnosis not present

## 2023-04-14 DIAGNOSIS — Z7951 Long term (current) use of inhaled steroids: Secondary | ICD-10-CM | POA: Diagnosis not present

## 2023-04-14 DIAGNOSIS — Z5982 Transportation insecurity: Secondary | ICD-10-CM | POA: Diagnosis not present

## 2023-04-14 DIAGNOSIS — E1136 Type 2 diabetes mellitus with diabetic cataract: Secondary | ICD-10-CM | POA: Diagnosis not present

## 2023-04-14 DIAGNOSIS — M069 Rheumatoid arthritis, unspecified: Secondary | ICD-10-CM | POA: Diagnosis not present

## 2023-04-14 DIAGNOSIS — I2510 Atherosclerotic heart disease of native coronary artery without angina pectoris: Secondary | ICD-10-CM | POA: Diagnosis not present

## 2023-04-14 DIAGNOSIS — Z96611 Presence of right artificial shoulder joint: Secondary | ICD-10-CM | POA: Diagnosis not present

## 2023-04-14 DIAGNOSIS — I69398 Other sequelae of cerebral infarction: Secondary | ICD-10-CM | POA: Diagnosis not present

## 2023-04-14 DIAGNOSIS — M47812 Spondylosis without myelopathy or radiculopathy, cervical region: Secondary | ICD-10-CM | POA: Diagnosis not present

## 2023-04-14 DIAGNOSIS — Z471 Aftercare following joint replacement surgery: Secondary | ICD-10-CM | POA: Diagnosis not present

## 2023-04-14 DIAGNOSIS — I10 Essential (primary) hypertension: Secondary | ICD-10-CM | POA: Diagnosis not present

## 2023-04-14 DIAGNOSIS — Z556 Problems related to health literacy: Secondary | ICD-10-CM | POA: Diagnosis not present

## 2023-04-14 DIAGNOSIS — Z7982 Long term (current) use of aspirin: Secondary | ICD-10-CM | POA: Diagnosis not present

## 2023-04-14 DIAGNOSIS — R531 Weakness: Secondary | ICD-10-CM | POA: Diagnosis not present

## 2023-04-14 DIAGNOSIS — F32A Depression, unspecified: Secondary | ICD-10-CM | POA: Diagnosis not present

## 2023-04-14 DIAGNOSIS — E1140 Type 2 diabetes mellitus with diabetic neuropathy, unspecified: Secondary | ICD-10-CM | POA: Diagnosis not present

## 2023-04-14 DIAGNOSIS — F17210 Nicotine dependence, cigarettes, uncomplicated: Secondary | ICD-10-CM | POA: Diagnosis not present

## 2023-04-14 DIAGNOSIS — H409 Unspecified glaucoma: Secondary | ICD-10-CM | POA: Diagnosis not present

## 2023-04-14 DIAGNOSIS — J4489 Other specified chronic obstructive pulmonary disease: Secondary | ICD-10-CM | POA: Diagnosis not present

## 2023-04-14 DIAGNOSIS — E785 Hyperlipidemia, unspecified: Secondary | ICD-10-CM | POA: Diagnosis not present

## 2023-04-17 DIAGNOSIS — F32A Depression, unspecified: Secondary | ICD-10-CM | POA: Diagnosis not present

## 2023-04-17 DIAGNOSIS — J4489 Other specified chronic obstructive pulmonary disease: Secondary | ICD-10-CM | POA: Diagnosis not present

## 2023-04-17 DIAGNOSIS — F17210 Nicotine dependence, cigarettes, uncomplicated: Secondary | ICD-10-CM | POA: Diagnosis not present

## 2023-04-17 DIAGNOSIS — I69398 Other sequelae of cerebral infarction: Secondary | ICD-10-CM | POA: Diagnosis not present

## 2023-04-17 DIAGNOSIS — Z471 Aftercare following joint replacement surgery: Secondary | ICD-10-CM | POA: Diagnosis not present

## 2023-04-17 DIAGNOSIS — E1136 Type 2 diabetes mellitus with diabetic cataract: Secondary | ICD-10-CM | POA: Diagnosis not present

## 2023-04-17 DIAGNOSIS — M069 Rheumatoid arthritis, unspecified: Secondary | ICD-10-CM | POA: Diagnosis not present

## 2023-04-17 DIAGNOSIS — Z5982 Transportation insecurity: Secondary | ICD-10-CM | POA: Diagnosis not present

## 2023-04-17 DIAGNOSIS — Z7951 Long term (current) use of inhaled steroids: Secondary | ICD-10-CM | POA: Diagnosis not present

## 2023-04-17 DIAGNOSIS — I2510 Atherosclerotic heart disease of native coronary artery without angina pectoris: Secondary | ICD-10-CM | POA: Diagnosis not present

## 2023-04-17 DIAGNOSIS — Z96611 Presence of right artificial shoulder joint: Secondary | ICD-10-CM | POA: Diagnosis not present

## 2023-04-17 DIAGNOSIS — Z7982 Long term (current) use of aspirin: Secondary | ICD-10-CM | POA: Diagnosis not present

## 2023-04-17 DIAGNOSIS — I10 Essential (primary) hypertension: Secondary | ICD-10-CM | POA: Diagnosis not present

## 2023-04-17 DIAGNOSIS — E785 Hyperlipidemia, unspecified: Secondary | ICD-10-CM | POA: Diagnosis not present

## 2023-04-17 DIAGNOSIS — M47812 Spondylosis without myelopathy or radiculopathy, cervical region: Secondary | ICD-10-CM | POA: Diagnosis not present

## 2023-04-17 DIAGNOSIS — H409 Unspecified glaucoma: Secondary | ICD-10-CM | POA: Diagnosis not present

## 2023-04-17 DIAGNOSIS — R531 Weakness: Secondary | ICD-10-CM | POA: Diagnosis not present

## 2023-04-17 DIAGNOSIS — E1140 Type 2 diabetes mellitus with diabetic neuropathy, unspecified: Secondary | ICD-10-CM | POA: Diagnosis not present

## 2023-04-17 DIAGNOSIS — Z556 Problems related to health literacy: Secondary | ICD-10-CM | POA: Diagnosis not present

## 2023-04-17 NOTE — Progress Notes (Signed)
Symptoms of COVID

## 2023-04-18 DIAGNOSIS — F17210 Nicotine dependence, cigarettes, uncomplicated: Secondary | ICD-10-CM | POA: Diagnosis not present

## 2023-04-18 DIAGNOSIS — E785 Hyperlipidemia, unspecified: Secondary | ICD-10-CM | POA: Diagnosis not present

## 2023-04-18 DIAGNOSIS — F32A Depression, unspecified: Secondary | ICD-10-CM | POA: Diagnosis not present

## 2023-04-18 DIAGNOSIS — Z556 Problems related to health literacy: Secondary | ICD-10-CM | POA: Diagnosis not present

## 2023-04-18 DIAGNOSIS — J4489 Other specified chronic obstructive pulmonary disease: Secondary | ICD-10-CM | POA: Diagnosis not present

## 2023-04-18 DIAGNOSIS — Z5982 Transportation insecurity: Secondary | ICD-10-CM | POA: Diagnosis not present

## 2023-04-18 DIAGNOSIS — M069 Rheumatoid arthritis, unspecified: Secondary | ICD-10-CM | POA: Diagnosis not present

## 2023-04-18 DIAGNOSIS — H409 Unspecified glaucoma: Secondary | ICD-10-CM | POA: Diagnosis not present

## 2023-04-18 DIAGNOSIS — R531 Weakness: Secondary | ICD-10-CM | POA: Diagnosis not present

## 2023-04-18 DIAGNOSIS — Z7951 Long term (current) use of inhaled steroids: Secondary | ICD-10-CM | POA: Diagnosis not present

## 2023-04-18 DIAGNOSIS — M47812 Spondylosis without myelopathy or radiculopathy, cervical region: Secondary | ICD-10-CM | POA: Diagnosis not present

## 2023-04-18 DIAGNOSIS — I2510 Atherosclerotic heart disease of native coronary artery without angina pectoris: Secondary | ICD-10-CM | POA: Diagnosis not present

## 2023-04-18 DIAGNOSIS — I69398 Other sequelae of cerebral infarction: Secondary | ICD-10-CM | POA: Diagnosis not present

## 2023-04-18 DIAGNOSIS — E1136 Type 2 diabetes mellitus with diabetic cataract: Secondary | ICD-10-CM | POA: Diagnosis not present

## 2023-04-18 DIAGNOSIS — I10 Essential (primary) hypertension: Secondary | ICD-10-CM | POA: Diagnosis not present

## 2023-04-18 DIAGNOSIS — Z96611 Presence of right artificial shoulder joint: Secondary | ICD-10-CM | POA: Diagnosis not present

## 2023-04-18 DIAGNOSIS — Z471 Aftercare following joint replacement surgery: Secondary | ICD-10-CM | POA: Diagnosis not present

## 2023-04-18 DIAGNOSIS — Z7982 Long term (current) use of aspirin: Secondary | ICD-10-CM | POA: Diagnosis not present

## 2023-04-18 DIAGNOSIS — E1140 Type 2 diabetes mellitus with diabetic neuropathy, unspecified: Secondary | ICD-10-CM | POA: Diagnosis not present

## 2023-04-19 DIAGNOSIS — E785 Hyperlipidemia, unspecified: Secondary | ICD-10-CM | POA: Diagnosis not present

## 2023-04-19 DIAGNOSIS — Z7982 Long term (current) use of aspirin: Secondary | ICD-10-CM | POA: Diagnosis not present

## 2023-04-19 DIAGNOSIS — J4489 Other specified chronic obstructive pulmonary disease: Secondary | ICD-10-CM | POA: Diagnosis not present

## 2023-04-19 DIAGNOSIS — E1136 Type 2 diabetes mellitus with diabetic cataract: Secondary | ICD-10-CM | POA: Diagnosis not present

## 2023-04-19 DIAGNOSIS — I10 Essential (primary) hypertension: Secondary | ICD-10-CM | POA: Diagnosis not present

## 2023-04-19 DIAGNOSIS — M47812 Spondylosis without myelopathy or radiculopathy, cervical region: Secondary | ICD-10-CM | POA: Diagnosis not present

## 2023-04-19 DIAGNOSIS — Z5982 Transportation insecurity: Secondary | ICD-10-CM | POA: Diagnosis not present

## 2023-04-19 DIAGNOSIS — Z96611 Presence of right artificial shoulder joint: Secondary | ICD-10-CM | POA: Diagnosis not present

## 2023-04-19 DIAGNOSIS — Z7951 Long term (current) use of inhaled steroids: Secondary | ICD-10-CM | POA: Diagnosis not present

## 2023-04-19 DIAGNOSIS — R531 Weakness: Secondary | ICD-10-CM | POA: Diagnosis not present

## 2023-04-19 DIAGNOSIS — I2510 Atherosclerotic heart disease of native coronary artery without angina pectoris: Secondary | ICD-10-CM | POA: Diagnosis not present

## 2023-04-19 DIAGNOSIS — Z471 Aftercare following joint replacement surgery: Secondary | ICD-10-CM | POA: Diagnosis not present

## 2023-04-19 DIAGNOSIS — F17210 Nicotine dependence, cigarettes, uncomplicated: Secondary | ICD-10-CM | POA: Diagnosis not present

## 2023-04-19 DIAGNOSIS — E1140 Type 2 diabetes mellitus with diabetic neuropathy, unspecified: Secondary | ICD-10-CM | POA: Diagnosis not present

## 2023-04-19 DIAGNOSIS — Z556 Problems related to health literacy: Secondary | ICD-10-CM | POA: Diagnosis not present

## 2023-04-19 DIAGNOSIS — M069 Rheumatoid arthritis, unspecified: Secondary | ICD-10-CM | POA: Diagnosis not present

## 2023-04-19 DIAGNOSIS — F32A Depression, unspecified: Secondary | ICD-10-CM | POA: Diagnosis not present

## 2023-04-19 DIAGNOSIS — H409 Unspecified glaucoma: Secondary | ICD-10-CM | POA: Diagnosis not present

## 2023-04-19 DIAGNOSIS — I69398 Other sequelae of cerebral infarction: Secondary | ICD-10-CM | POA: Diagnosis not present

## 2023-04-23 DIAGNOSIS — H409 Unspecified glaucoma: Secondary | ICD-10-CM | POA: Diagnosis not present

## 2023-04-23 DIAGNOSIS — Z471 Aftercare following joint replacement surgery: Secondary | ICD-10-CM | POA: Diagnosis not present

## 2023-04-23 DIAGNOSIS — E1140 Type 2 diabetes mellitus with diabetic neuropathy, unspecified: Secondary | ICD-10-CM | POA: Diagnosis not present

## 2023-04-23 DIAGNOSIS — I69398 Other sequelae of cerebral infarction: Secondary | ICD-10-CM | POA: Diagnosis not present

## 2023-04-23 DIAGNOSIS — Z7951 Long term (current) use of inhaled steroids: Secondary | ICD-10-CM | POA: Diagnosis not present

## 2023-04-23 DIAGNOSIS — M47812 Spondylosis without myelopathy or radiculopathy, cervical region: Secondary | ICD-10-CM | POA: Diagnosis not present

## 2023-04-23 DIAGNOSIS — Z7982 Long term (current) use of aspirin: Secondary | ICD-10-CM | POA: Diagnosis not present

## 2023-04-23 DIAGNOSIS — Z96611 Presence of right artificial shoulder joint: Secondary | ICD-10-CM | POA: Diagnosis not present

## 2023-04-23 DIAGNOSIS — E785 Hyperlipidemia, unspecified: Secondary | ICD-10-CM | POA: Diagnosis not present

## 2023-04-23 DIAGNOSIS — R531 Weakness: Secondary | ICD-10-CM | POA: Diagnosis not present

## 2023-04-23 DIAGNOSIS — Z556 Problems related to health literacy: Secondary | ICD-10-CM | POA: Diagnosis not present

## 2023-04-23 DIAGNOSIS — I2510 Atherosclerotic heart disease of native coronary artery without angina pectoris: Secondary | ICD-10-CM | POA: Diagnosis not present

## 2023-04-23 DIAGNOSIS — I10 Essential (primary) hypertension: Secondary | ICD-10-CM | POA: Diagnosis not present

## 2023-04-23 DIAGNOSIS — J4489 Other specified chronic obstructive pulmonary disease: Secondary | ICD-10-CM | POA: Diagnosis not present

## 2023-04-23 DIAGNOSIS — E1136 Type 2 diabetes mellitus with diabetic cataract: Secondary | ICD-10-CM | POA: Diagnosis not present

## 2023-04-23 DIAGNOSIS — M069 Rheumatoid arthritis, unspecified: Secondary | ICD-10-CM | POA: Diagnosis not present

## 2023-04-23 DIAGNOSIS — F32A Depression, unspecified: Secondary | ICD-10-CM | POA: Diagnosis not present

## 2023-04-23 DIAGNOSIS — Z5982 Transportation insecurity: Secondary | ICD-10-CM | POA: Diagnosis not present

## 2023-04-23 DIAGNOSIS — F17210 Nicotine dependence, cigarettes, uncomplicated: Secondary | ICD-10-CM | POA: Diagnosis not present

## 2023-04-30 DIAGNOSIS — H409 Unspecified glaucoma: Secondary | ICD-10-CM | POA: Diagnosis not present

## 2023-04-30 DIAGNOSIS — G4700 Insomnia, unspecified: Secondary | ICD-10-CM | POA: Diagnosis not present

## 2023-04-30 DIAGNOSIS — F17210 Nicotine dependence, cigarettes, uncomplicated: Secondary | ICD-10-CM | POA: Diagnosis not present

## 2023-04-30 DIAGNOSIS — Z556 Problems related to health literacy: Secondary | ICD-10-CM | POA: Diagnosis not present

## 2023-04-30 DIAGNOSIS — H25012 Cortical age-related cataract, left eye: Secondary | ICD-10-CM | POA: Diagnosis not present

## 2023-04-30 DIAGNOSIS — Z7951 Long term (current) use of inhaled steroids: Secondary | ICD-10-CM | POA: Diagnosis not present

## 2023-04-30 DIAGNOSIS — I951 Orthostatic hypotension: Secondary | ICD-10-CM | POA: Diagnosis not present

## 2023-04-30 DIAGNOSIS — I69344 Monoplegia of lower limb following cerebral infarction affecting left non-dominant side: Secondary | ICD-10-CM | POA: Diagnosis not present

## 2023-04-30 DIAGNOSIS — E1140 Type 2 diabetes mellitus with diabetic neuropathy, unspecified: Secondary | ICD-10-CM | POA: Diagnosis not present

## 2023-04-30 DIAGNOSIS — M069 Rheumatoid arthritis, unspecified: Secondary | ICD-10-CM | POA: Diagnosis not present

## 2023-04-30 DIAGNOSIS — Z5982 Transportation insecurity: Secondary | ICD-10-CM | POA: Diagnosis not present

## 2023-04-30 DIAGNOSIS — H25011 Cortical age-related cataract, right eye: Secondary | ICD-10-CM | POA: Diagnosis not present

## 2023-04-30 DIAGNOSIS — M4802 Spinal stenosis, cervical region: Secondary | ICD-10-CM | POA: Diagnosis not present

## 2023-04-30 DIAGNOSIS — R000 Tachycardia, unspecified: Secondary | ICD-10-CM | POA: Diagnosis not present

## 2023-04-30 DIAGNOSIS — D649 Anemia, unspecified: Secondary | ICD-10-CM | POA: Diagnosis not present

## 2023-04-30 DIAGNOSIS — I10 Essential (primary) hypertension: Secondary | ICD-10-CM | POA: Diagnosis not present

## 2023-04-30 DIAGNOSIS — I700 Atherosclerosis of aorta: Secondary | ICD-10-CM | POA: Diagnosis not present

## 2023-04-30 DIAGNOSIS — E785 Hyperlipidemia, unspecified: Secondary | ICD-10-CM | POA: Diagnosis not present

## 2023-04-30 DIAGNOSIS — K219 Gastro-esophageal reflux disease without esophagitis: Secondary | ICD-10-CM | POA: Diagnosis not present

## 2023-04-30 DIAGNOSIS — M8580 Other specified disorders of bone density and structure, unspecified site: Secondary | ICD-10-CM | POA: Diagnosis not present

## 2023-04-30 DIAGNOSIS — Z471 Aftercare following joint replacement surgery: Secondary | ICD-10-CM | POA: Diagnosis not present

## 2023-04-30 DIAGNOSIS — Z96611 Presence of right artificial shoulder joint: Secondary | ICD-10-CM | POA: Diagnosis not present

## 2023-04-30 DIAGNOSIS — J4489 Other specified chronic obstructive pulmonary disease: Secondary | ICD-10-CM | POA: Diagnosis not present

## 2023-05-02 DIAGNOSIS — J4489 Other specified chronic obstructive pulmonary disease: Secondary | ICD-10-CM | POA: Diagnosis not present

## 2023-05-02 DIAGNOSIS — Z7951 Long term (current) use of inhaled steroids: Secondary | ICD-10-CM | POA: Diagnosis not present

## 2023-05-02 DIAGNOSIS — I951 Orthostatic hypotension: Secondary | ICD-10-CM | POA: Diagnosis not present

## 2023-05-02 DIAGNOSIS — E1140 Type 2 diabetes mellitus with diabetic neuropathy, unspecified: Secondary | ICD-10-CM | POA: Diagnosis not present

## 2023-05-02 DIAGNOSIS — H409 Unspecified glaucoma: Secondary | ICD-10-CM | POA: Diagnosis not present

## 2023-05-02 DIAGNOSIS — H25011 Cortical age-related cataract, right eye: Secondary | ICD-10-CM | POA: Diagnosis not present

## 2023-05-02 DIAGNOSIS — Z5982 Transportation insecurity: Secondary | ICD-10-CM | POA: Diagnosis not present

## 2023-05-02 DIAGNOSIS — H25012 Cortical age-related cataract, left eye: Secondary | ICD-10-CM | POA: Diagnosis not present

## 2023-05-02 DIAGNOSIS — D649 Anemia, unspecified: Secondary | ICD-10-CM | POA: Diagnosis not present

## 2023-05-02 DIAGNOSIS — R000 Tachycardia, unspecified: Secondary | ICD-10-CM | POA: Diagnosis not present

## 2023-05-02 DIAGNOSIS — E785 Hyperlipidemia, unspecified: Secondary | ICD-10-CM | POA: Diagnosis not present

## 2023-05-02 DIAGNOSIS — I69344 Monoplegia of lower limb following cerebral infarction affecting left non-dominant side: Secondary | ICD-10-CM | POA: Diagnosis not present

## 2023-05-02 DIAGNOSIS — I700 Atherosclerosis of aorta: Secondary | ICD-10-CM | POA: Diagnosis not present

## 2023-05-02 DIAGNOSIS — I10 Essential (primary) hypertension: Secondary | ICD-10-CM | POA: Diagnosis not present

## 2023-05-02 DIAGNOSIS — G4700 Insomnia, unspecified: Secondary | ICD-10-CM | POA: Diagnosis not present

## 2023-05-02 DIAGNOSIS — M069 Rheumatoid arthritis, unspecified: Secondary | ICD-10-CM | POA: Diagnosis not present

## 2023-05-02 DIAGNOSIS — K219 Gastro-esophageal reflux disease without esophagitis: Secondary | ICD-10-CM | POA: Diagnosis not present

## 2023-05-02 DIAGNOSIS — Z96611 Presence of right artificial shoulder joint: Secondary | ICD-10-CM | POA: Diagnosis not present

## 2023-05-02 DIAGNOSIS — F17210 Nicotine dependence, cigarettes, uncomplicated: Secondary | ICD-10-CM | POA: Diagnosis not present

## 2023-05-02 DIAGNOSIS — M4802 Spinal stenosis, cervical region: Secondary | ICD-10-CM | POA: Diagnosis not present

## 2023-05-02 DIAGNOSIS — Z556 Problems related to health literacy: Secondary | ICD-10-CM | POA: Diagnosis not present

## 2023-05-02 DIAGNOSIS — M8580 Other specified disorders of bone density and structure, unspecified site: Secondary | ICD-10-CM | POA: Diagnosis not present

## 2023-05-02 DIAGNOSIS — Z471 Aftercare following joint replacement surgery: Secondary | ICD-10-CM | POA: Diagnosis not present

## 2023-05-03 DIAGNOSIS — Z5982 Transportation insecurity: Secondary | ICD-10-CM | POA: Diagnosis not present

## 2023-05-03 DIAGNOSIS — G4700 Insomnia, unspecified: Secondary | ICD-10-CM | POA: Diagnosis not present

## 2023-05-03 DIAGNOSIS — H25011 Cortical age-related cataract, right eye: Secondary | ICD-10-CM | POA: Diagnosis not present

## 2023-05-03 DIAGNOSIS — I10 Essential (primary) hypertension: Secondary | ICD-10-CM | POA: Diagnosis not present

## 2023-05-03 DIAGNOSIS — K219 Gastro-esophageal reflux disease without esophagitis: Secondary | ICD-10-CM | POA: Diagnosis not present

## 2023-05-03 DIAGNOSIS — R000 Tachycardia, unspecified: Secondary | ICD-10-CM | POA: Diagnosis not present

## 2023-05-03 DIAGNOSIS — D649 Anemia, unspecified: Secondary | ICD-10-CM | POA: Diagnosis not present

## 2023-05-03 DIAGNOSIS — I951 Orthostatic hypotension: Secondary | ICD-10-CM | POA: Diagnosis not present

## 2023-05-03 DIAGNOSIS — E1140 Type 2 diabetes mellitus with diabetic neuropathy, unspecified: Secondary | ICD-10-CM | POA: Diagnosis not present

## 2023-05-03 DIAGNOSIS — I69344 Monoplegia of lower limb following cerebral infarction affecting left non-dominant side: Secondary | ICD-10-CM | POA: Diagnosis not present

## 2023-05-03 DIAGNOSIS — Z7951 Long term (current) use of inhaled steroids: Secondary | ICD-10-CM | POA: Diagnosis not present

## 2023-05-03 DIAGNOSIS — I700 Atherosclerosis of aorta: Secondary | ICD-10-CM | POA: Diagnosis not present

## 2023-05-03 DIAGNOSIS — Z556 Problems related to health literacy: Secondary | ICD-10-CM | POA: Diagnosis not present

## 2023-05-03 DIAGNOSIS — H409 Unspecified glaucoma: Secondary | ICD-10-CM | POA: Diagnosis not present

## 2023-05-03 DIAGNOSIS — J4489 Other specified chronic obstructive pulmonary disease: Secondary | ICD-10-CM | POA: Diagnosis not present

## 2023-05-03 DIAGNOSIS — M8580 Other specified disorders of bone density and structure, unspecified site: Secondary | ICD-10-CM | POA: Diagnosis not present

## 2023-05-03 DIAGNOSIS — Z96611 Presence of right artificial shoulder joint: Secondary | ICD-10-CM | POA: Diagnosis not present

## 2023-05-03 DIAGNOSIS — H25012 Cortical age-related cataract, left eye: Secondary | ICD-10-CM | POA: Diagnosis not present

## 2023-05-03 DIAGNOSIS — Z471 Aftercare following joint replacement surgery: Secondary | ICD-10-CM | POA: Diagnosis not present

## 2023-05-03 DIAGNOSIS — F17210 Nicotine dependence, cigarettes, uncomplicated: Secondary | ICD-10-CM | POA: Diagnosis not present

## 2023-05-03 DIAGNOSIS — M069 Rheumatoid arthritis, unspecified: Secondary | ICD-10-CM | POA: Diagnosis not present

## 2023-05-03 DIAGNOSIS — E785 Hyperlipidemia, unspecified: Secondary | ICD-10-CM | POA: Diagnosis not present

## 2023-05-03 DIAGNOSIS — M4802 Spinal stenosis, cervical region: Secondary | ICD-10-CM | POA: Diagnosis not present

## 2023-05-04 DIAGNOSIS — G4700 Insomnia, unspecified: Secondary | ICD-10-CM | POA: Diagnosis not present

## 2023-05-04 DIAGNOSIS — I951 Orthostatic hypotension: Secondary | ICD-10-CM | POA: Diagnosis not present

## 2023-05-04 DIAGNOSIS — M8580 Other specified disorders of bone density and structure, unspecified site: Secondary | ICD-10-CM | POA: Diagnosis not present

## 2023-05-04 DIAGNOSIS — I10 Essential (primary) hypertension: Secondary | ICD-10-CM | POA: Diagnosis not present

## 2023-05-04 DIAGNOSIS — E1140 Type 2 diabetes mellitus with diabetic neuropathy, unspecified: Secondary | ICD-10-CM | POA: Diagnosis not present

## 2023-05-04 DIAGNOSIS — R000 Tachycardia, unspecified: Secondary | ICD-10-CM | POA: Diagnosis not present

## 2023-05-04 DIAGNOSIS — I700 Atherosclerosis of aorta: Secondary | ICD-10-CM | POA: Diagnosis not present

## 2023-05-04 DIAGNOSIS — M4802 Spinal stenosis, cervical region: Secondary | ICD-10-CM | POA: Diagnosis not present

## 2023-05-04 DIAGNOSIS — Z96611 Presence of right artificial shoulder joint: Secondary | ICD-10-CM | POA: Diagnosis not present

## 2023-05-04 DIAGNOSIS — J4489 Other specified chronic obstructive pulmonary disease: Secondary | ICD-10-CM | POA: Diagnosis not present

## 2023-05-04 DIAGNOSIS — H25012 Cortical age-related cataract, left eye: Secondary | ICD-10-CM | POA: Diagnosis not present

## 2023-05-04 DIAGNOSIS — K219 Gastro-esophageal reflux disease without esophagitis: Secondary | ICD-10-CM | POA: Diagnosis not present

## 2023-05-04 DIAGNOSIS — E785 Hyperlipidemia, unspecified: Secondary | ICD-10-CM | POA: Diagnosis not present

## 2023-05-04 DIAGNOSIS — Z556 Problems related to health literacy: Secondary | ICD-10-CM | POA: Diagnosis not present

## 2023-05-04 DIAGNOSIS — D649 Anemia, unspecified: Secondary | ICD-10-CM | POA: Diagnosis not present

## 2023-05-04 DIAGNOSIS — F17210 Nicotine dependence, cigarettes, uncomplicated: Secondary | ICD-10-CM | POA: Diagnosis not present

## 2023-05-04 DIAGNOSIS — M069 Rheumatoid arthritis, unspecified: Secondary | ICD-10-CM | POA: Diagnosis not present

## 2023-05-04 DIAGNOSIS — H409 Unspecified glaucoma: Secondary | ICD-10-CM | POA: Diagnosis not present

## 2023-05-04 DIAGNOSIS — Z471 Aftercare following joint replacement surgery: Secondary | ICD-10-CM | POA: Diagnosis not present

## 2023-05-04 DIAGNOSIS — H25011 Cortical age-related cataract, right eye: Secondary | ICD-10-CM | POA: Diagnosis not present

## 2023-05-04 DIAGNOSIS — I69344 Monoplegia of lower limb following cerebral infarction affecting left non-dominant side: Secondary | ICD-10-CM | POA: Diagnosis not present

## 2023-05-04 DIAGNOSIS — Z5982 Transportation insecurity: Secondary | ICD-10-CM | POA: Diagnosis not present

## 2023-05-04 DIAGNOSIS — Z7951 Long term (current) use of inhaled steroids: Secondary | ICD-10-CM | POA: Diagnosis not present

## 2023-05-07 DIAGNOSIS — H25012 Cortical age-related cataract, left eye: Secondary | ICD-10-CM | POA: Diagnosis not present

## 2023-05-07 DIAGNOSIS — H25011 Cortical age-related cataract, right eye: Secondary | ICD-10-CM | POA: Diagnosis not present

## 2023-05-07 DIAGNOSIS — I951 Orthostatic hypotension: Secondary | ICD-10-CM | POA: Diagnosis not present

## 2023-05-07 DIAGNOSIS — Z96611 Presence of right artificial shoulder joint: Secondary | ICD-10-CM | POA: Diagnosis not present

## 2023-05-07 DIAGNOSIS — R000 Tachycardia, unspecified: Secondary | ICD-10-CM | POA: Diagnosis not present

## 2023-05-07 DIAGNOSIS — M4802 Spinal stenosis, cervical region: Secondary | ICD-10-CM | POA: Diagnosis not present

## 2023-05-07 DIAGNOSIS — Z5982 Transportation insecurity: Secondary | ICD-10-CM | POA: Diagnosis not present

## 2023-05-07 DIAGNOSIS — I69344 Monoplegia of lower limb following cerebral infarction affecting left non-dominant side: Secondary | ICD-10-CM | POA: Diagnosis not present

## 2023-05-07 DIAGNOSIS — H409 Unspecified glaucoma: Secondary | ICD-10-CM | POA: Diagnosis not present

## 2023-05-07 DIAGNOSIS — I700 Atherosclerosis of aorta: Secondary | ICD-10-CM | POA: Diagnosis not present

## 2023-05-07 DIAGNOSIS — G4700 Insomnia, unspecified: Secondary | ICD-10-CM | POA: Diagnosis not present

## 2023-05-07 DIAGNOSIS — M8580 Other specified disorders of bone density and structure, unspecified site: Secondary | ICD-10-CM | POA: Diagnosis not present

## 2023-05-07 DIAGNOSIS — Z556 Problems related to health literacy: Secondary | ICD-10-CM | POA: Diagnosis not present

## 2023-05-07 DIAGNOSIS — F17210 Nicotine dependence, cigarettes, uncomplicated: Secondary | ICD-10-CM | POA: Diagnosis not present

## 2023-05-07 DIAGNOSIS — D649 Anemia, unspecified: Secondary | ICD-10-CM | POA: Diagnosis not present

## 2023-05-07 DIAGNOSIS — J4489 Other specified chronic obstructive pulmonary disease: Secondary | ICD-10-CM | POA: Diagnosis not present

## 2023-05-07 DIAGNOSIS — Z471 Aftercare following joint replacement surgery: Secondary | ICD-10-CM | POA: Diagnosis not present

## 2023-05-07 DIAGNOSIS — E785 Hyperlipidemia, unspecified: Secondary | ICD-10-CM | POA: Diagnosis not present

## 2023-05-07 DIAGNOSIS — I10 Essential (primary) hypertension: Secondary | ICD-10-CM | POA: Diagnosis not present

## 2023-05-07 DIAGNOSIS — K219 Gastro-esophageal reflux disease without esophagitis: Secondary | ICD-10-CM | POA: Diagnosis not present

## 2023-05-07 DIAGNOSIS — M069 Rheumatoid arthritis, unspecified: Secondary | ICD-10-CM | POA: Diagnosis not present

## 2023-05-07 DIAGNOSIS — E1140 Type 2 diabetes mellitus with diabetic neuropathy, unspecified: Secondary | ICD-10-CM | POA: Diagnosis not present

## 2023-05-07 DIAGNOSIS — Z7951 Long term (current) use of inhaled steroids: Secondary | ICD-10-CM | POA: Diagnosis not present

## 2023-05-09 DIAGNOSIS — D649 Anemia, unspecified: Secondary | ICD-10-CM | POA: Diagnosis not present

## 2023-05-09 DIAGNOSIS — Z556 Problems related to health literacy: Secondary | ICD-10-CM | POA: Diagnosis not present

## 2023-05-09 DIAGNOSIS — G4700 Insomnia, unspecified: Secondary | ICD-10-CM | POA: Diagnosis not present

## 2023-05-09 DIAGNOSIS — H25012 Cortical age-related cataract, left eye: Secondary | ICD-10-CM | POA: Diagnosis not present

## 2023-05-09 DIAGNOSIS — I700 Atherosclerosis of aorta: Secondary | ICD-10-CM | POA: Diagnosis not present

## 2023-05-09 DIAGNOSIS — J4489 Other specified chronic obstructive pulmonary disease: Secondary | ICD-10-CM | POA: Diagnosis not present

## 2023-05-09 DIAGNOSIS — H409 Unspecified glaucoma: Secondary | ICD-10-CM | POA: Diagnosis not present

## 2023-05-09 DIAGNOSIS — Z5982 Transportation insecurity: Secondary | ICD-10-CM | POA: Diagnosis not present

## 2023-05-09 DIAGNOSIS — Z96611 Presence of right artificial shoulder joint: Secondary | ICD-10-CM | POA: Diagnosis not present

## 2023-05-09 DIAGNOSIS — E785 Hyperlipidemia, unspecified: Secondary | ICD-10-CM | POA: Diagnosis not present

## 2023-05-09 DIAGNOSIS — H25011 Cortical age-related cataract, right eye: Secondary | ICD-10-CM | POA: Diagnosis not present

## 2023-05-09 DIAGNOSIS — F17210 Nicotine dependence, cigarettes, uncomplicated: Secondary | ICD-10-CM | POA: Diagnosis not present

## 2023-05-09 DIAGNOSIS — R000 Tachycardia, unspecified: Secondary | ICD-10-CM | POA: Diagnosis not present

## 2023-05-09 DIAGNOSIS — I10 Essential (primary) hypertension: Secondary | ICD-10-CM | POA: Diagnosis not present

## 2023-05-09 DIAGNOSIS — K219 Gastro-esophageal reflux disease without esophagitis: Secondary | ICD-10-CM | POA: Diagnosis not present

## 2023-05-09 DIAGNOSIS — Z471 Aftercare following joint replacement surgery: Secondary | ICD-10-CM | POA: Diagnosis not present

## 2023-05-09 DIAGNOSIS — Z7951 Long term (current) use of inhaled steroids: Secondary | ICD-10-CM | POA: Diagnosis not present

## 2023-05-09 DIAGNOSIS — I951 Orthostatic hypotension: Secondary | ICD-10-CM | POA: Diagnosis not present

## 2023-05-09 DIAGNOSIS — M069 Rheumatoid arthritis, unspecified: Secondary | ICD-10-CM | POA: Diagnosis not present

## 2023-05-09 DIAGNOSIS — M8580 Other specified disorders of bone density and structure, unspecified site: Secondary | ICD-10-CM | POA: Diagnosis not present

## 2023-05-09 DIAGNOSIS — M4802 Spinal stenosis, cervical region: Secondary | ICD-10-CM | POA: Diagnosis not present

## 2023-05-09 DIAGNOSIS — E1140 Type 2 diabetes mellitus with diabetic neuropathy, unspecified: Secondary | ICD-10-CM | POA: Diagnosis not present

## 2023-05-09 DIAGNOSIS — I69344 Monoplegia of lower limb following cerebral infarction affecting left non-dominant side: Secondary | ICD-10-CM | POA: Diagnosis not present

## 2023-05-10 ENCOUNTER — Telehealth: Payer: Self-pay

## 2023-05-10 NOTE — Telephone Encounter (Signed)
Ok to order this 

## 2023-05-10 NOTE — Telephone Encounter (Signed)
Copied from CRM [id number]. Topic: Referral - Request for Referral >> May 10, 2023 12:14 PM Phill Myron wrote: Ms Agustin Cree who is with [facility] is requesting an order for services: Medication Management and Education ..please call her at [phone]

## 2023-05-10 NOTE — Telephone Encounter (Signed)
Is this okay to order?  Thanks,   -Laura  

## 2023-05-10 NOTE — Telephone Encounter (Signed)
[name] was able able to take a verbal.  She says they will call back with pt [name] will need.   Thanks,   -Vernona Rieger

## 2023-05-11 DIAGNOSIS — Z96611 Presence of right artificial shoulder joint: Secondary | ICD-10-CM | POA: Diagnosis not present

## 2023-05-11 DIAGNOSIS — E1140 Type 2 diabetes mellitus with diabetic neuropathy, unspecified: Secondary | ICD-10-CM | POA: Diagnosis not present

## 2023-05-11 DIAGNOSIS — H25012 Cortical age-related cataract, left eye: Secondary | ICD-10-CM | POA: Diagnosis not present

## 2023-05-11 DIAGNOSIS — D649 Anemia, unspecified: Secondary | ICD-10-CM | POA: Diagnosis not present

## 2023-05-11 DIAGNOSIS — H25011 Cortical age-related cataract, right eye: Secondary | ICD-10-CM | POA: Diagnosis not present

## 2023-05-11 DIAGNOSIS — Z471 Aftercare following joint replacement surgery: Secondary | ICD-10-CM | POA: Diagnosis not present

## 2023-05-11 DIAGNOSIS — Z556 Problems related to health literacy: Secondary | ICD-10-CM | POA: Diagnosis not present

## 2023-05-11 DIAGNOSIS — M4802 Spinal stenosis, cervical region: Secondary | ICD-10-CM | POA: Diagnosis not present

## 2023-05-11 DIAGNOSIS — Z7951 Long term (current) use of inhaled steroids: Secondary | ICD-10-CM | POA: Diagnosis not present

## 2023-05-11 DIAGNOSIS — I700 Atherosclerosis of aorta: Secondary | ICD-10-CM | POA: Diagnosis not present

## 2023-05-11 DIAGNOSIS — I951 Orthostatic hypotension: Secondary | ICD-10-CM | POA: Diagnosis not present

## 2023-05-11 DIAGNOSIS — H409 Unspecified glaucoma: Secondary | ICD-10-CM | POA: Diagnosis not present

## 2023-05-11 DIAGNOSIS — I69344 Monoplegia of lower limb following cerebral infarction affecting left non-dominant side: Secondary | ICD-10-CM | POA: Diagnosis not present

## 2023-05-11 DIAGNOSIS — M8580 Other specified disorders of bone density and structure, unspecified site: Secondary | ICD-10-CM | POA: Diagnosis not present

## 2023-05-11 DIAGNOSIS — M069 Rheumatoid arthritis, unspecified: Secondary | ICD-10-CM | POA: Diagnosis not present

## 2023-05-11 DIAGNOSIS — K219 Gastro-esophageal reflux disease without esophagitis: Secondary | ICD-10-CM | POA: Diagnosis not present

## 2023-05-11 DIAGNOSIS — R000 Tachycardia, unspecified: Secondary | ICD-10-CM | POA: Diagnosis not present

## 2023-05-11 DIAGNOSIS — F17210 Nicotine dependence, cigarettes, uncomplicated: Secondary | ICD-10-CM | POA: Diagnosis not present

## 2023-05-11 DIAGNOSIS — I10 Essential (primary) hypertension: Secondary | ICD-10-CM | POA: Diagnosis not present

## 2023-05-11 DIAGNOSIS — Z5982 Transportation insecurity: Secondary | ICD-10-CM | POA: Diagnosis not present

## 2023-05-11 DIAGNOSIS — E785 Hyperlipidemia, unspecified: Secondary | ICD-10-CM | POA: Diagnosis not present

## 2023-05-11 DIAGNOSIS — J4489 Other specified chronic obstructive pulmonary disease: Secondary | ICD-10-CM | POA: Diagnosis not present

## 2023-05-11 DIAGNOSIS — G4700 Insomnia, unspecified: Secondary | ICD-10-CM | POA: Diagnosis not present

## 2023-05-14 DIAGNOSIS — I69344 Monoplegia of lower limb following cerebral infarction affecting left non-dominant side: Secondary | ICD-10-CM | POA: Diagnosis not present

## 2023-05-14 DIAGNOSIS — Z471 Aftercare following joint replacement surgery: Secondary | ICD-10-CM | POA: Diagnosis not present

## 2023-05-14 DIAGNOSIS — H25011 Cortical age-related cataract, right eye: Secondary | ICD-10-CM | POA: Diagnosis not present

## 2023-05-14 DIAGNOSIS — H409 Unspecified glaucoma: Secondary | ICD-10-CM | POA: Diagnosis not present

## 2023-05-14 DIAGNOSIS — J4489 Other specified chronic obstructive pulmonary disease: Secondary | ICD-10-CM | POA: Diagnosis not present

## 2023-05-14 DIAGNOSIS — I951 Orthostatic hypotension: Secondary | ICD-10-CM | POA: Diagnosis not present

## 2023-05-14 DIAGNOSIS — H25012 Cortical age-related cataract, left eye: Secondary | ICD-10-CM | POA: Diagnosis not present

## 2023-05-14 DIAGNOSIS — M4802 Spinal stenosis, cervical region: Secondary | ICD-10-CM | POA: Diagnosis not present

## 2023-05-14 DIAGNOSIS — Z556 Problems related to health literacy: Secondary | ICD-10-CM | POA: Diagnosis not present

## 2023-05-14 DIAGNOSIS — R000 Tachycardia, unspecified: Secondary | ICD-10-CM | POA: Diagnosis not present

## 2023-05-14 DIAGNOSIS — G4700 Insomnia, unspecified: Secondary | ICD-10-CM | POA: Diagnosis not present

## 2023-05-14 DIAGNOSIS — M069 Rheumatoid arthritis, unspecified: Secondary | ICD-10-CM | POA: Diagnosis not present

## 2023-05-14 DIAGNOSIS — D649 Anemia, unspecified: Secondary | ICD-10-CM | POA: Diagnosis not present

## 2023-05-14 DIAGNOSIS — I10 Essential (primary) hypertension: Secondary | ICD-10-CM | POA: Diagnosis not present

## 2023-05-14 DIAGNOSIS — Z5982 Transportation insecurity: Secondary | ICD-10-CM | POA: Diagnosis not present

## 2023-05-14 DIAGNOSIS — M8580 Other specified disorders of bone density and structure, unspecified site: Secondary | ICD-10-CM | POA: Diagnosis not present

## 2023-05-14 DIAGNOSIS — E785 Hyperlipidemia, unspecified: Secondary | ICD-10-CM | POA: Diagnosis not present

## 2023-05-14 DIAGNOSIS — E1140 Type 2 diabetes mellitus with diabetic neuropathy, unspecified: Secondary | ICD-10-CM | POA: Diagnosis not present

## 2023-05-14 DIAGNOSIS — Z96611 Presence of right artificial shoulder joint: Secondary | ICD-10-CM | POA: Diagnosis not present

## 2023-05-14 DIAGNOSIS — I700 Atherosclerosis of aorta: Secondary | ICD-10-CM | POA: Diagnosis not present

## 2023-05-14 DIAGNOSIS — Z7951 Long term (current) use of inhaled steroids: Secondary | ICD-10-CM | POA: Diagnosis not present

## 2023-05-14 DIAGNOSIS — F17210 Nicotine dependence, cigarettes, uncomplicated: Secondary | ICD-10-CM | POA: Diagnosis not present

## 2023-05-14 DIAGNOSIS — K219 Gastro-esophageal reflux disease without esophagitis: Secondary | ICD-10-CM | POA: Diagnosis not present

## 2023-05-16 ENCOUNTER — Other Ambulatory Visit: Payer: Self-pay | Admitting: Internal Medicine

## 2023-05-16 DIAGNOSIS — E1140 Type 2 diabetes mellitus with diabetic neuropathy, unspecified: Secondary | ICD-10-CM | POA: Diagnosis not present

## 2023-05-16 DIAGNOSIS — Z556 Problems related to health literacy: Secondary | ICD-10-CM | POA: Diagnosis not present

## 2023-05-16 DIAGNOSIS — I700 Atherosclerosis of aorta: Secondary | ICD-10-CM | POA: Diagnosis not present

## 2023-05-16 DIAGNOSIS — H25012 Cortical age-related cataract, left eye: Secondary | ICD-10-CM | POA: Diagnosis not present

## 2023-05-16 DIAGNOSIS — E785 Hyperlipidemia, unspecified: Secondary | ICD-10-CM | POA: Diagnosis not present

## 2023-05-16 DIAGNOSIS — H409 Unspecified glaucoma: Secondary | ICD-10-CM | POA: Diagnosis not present

## 2023-05-16 DIAGNOSIS — M4802 Spinal stenosis, cervical region: Secondary | ICD-10-CM | POA: Diagnosis not present

## 2023-05-16 DIAGNOSIS — J4489 Other specified chronic obstructive pulmonary disease: Secondary | ICD-10-CM | POA: Diagnosis not present

## 2023-05-16 DIAGNOSIS — R000 Tachycardia, unspecified: Secondary | ICD-10-CM | POA: Diagnosis not present

## 2023-05-16 DIAGNOSIS — M069 Rheumatoid arthritis, unspecified: Secondary | ICD-10-CM | POA: Diagnosis not present

## 2023-05-16 DIAGNOSIS — I951 Orthostatic hypotension: Secondary | ICD-10-CM | POA: Diagnosis not present

## 2023-05-16 DIAGNOSIS — Z5982 Transportation insecurity: Secondary | ICD-10-CM | POA: Diagnosis not present

## 2023-05-16 DIAGNOSIS — I10 Essential (primary) hypertension: Secondary | ICD-10-CM | POA: Diagnosis not present

## 2023-05-16 DIAGNOSIS — Z7951 Long term (current) use of inhaled steroids: Secondary | ICD-10-CM | POA: Diagnosis not present

## 2023-05-16 DIAGNOSIS — J432 Centrilobular emphysema: Secondary | ICD-10-CM

## 2023-05-16 DIAGNOSIS — K219 Gastro-esophageal reflux disease without esophagitis: Secondary | ICD-10-CM | POA: Diagnosis not present

## 2023-05-16 DIAGNOSIS — G4700 Insomnia, unspecified: Secondary | ICD-10-CM | POA: Diagnosis not present

## 2023-05-16 DIAGNOSIS — I69344 Monoplegia of lower limb following cerebral infarction affecting left non-dominant side: Secondary | ICD-10-CM | POA: Diagnosis not present

## 2023-05-16 DIAGNOSIS — F17210 Nicotine dependence, cigarettes, uncomplicated: Secondary | ICD-10-CM | POA: Diagnosis not present

## 2023-05-16 DIAGNOSIS — H25011 Cortical age-related cataract, right eye: Secondary | ICD-10-CM | POA: Diagnosis not present

## 2023-05-16 DIAGNOSIS — M8580 Other specified disorders of bone density and structure, unspecified site: Secondary | ICD-10-CM | POA: Diagnosis not present

## 2023-05-16 DIAGNOSIS — Z96611 Presence of right artificial shoulder joint: Secondary | ICD-10-CM | POA: Diagnosis not present

## 2023-05-16 DIAGNOSIS — D649 Anemia, unspecified: Secondary | ICD-10-CM | POA: Diagnosis not present

## 2023-05-16 DIAGNOSIS — Z471 Aftercare following joint replacement surgery: Secondary | ICD-10-CM | POA: Diagnosis not present

## 2023-05-16 NOTE — Telephone Encounter (Signed)
Medication Refill - Medication: aspirin EC 81 MG tablet atorvastatin (LIPITOR) 40 MG tablet Calcium Carbonate (CALCIUM 600 PO) Fluticasone-Umeclidin-Vilant (TRELEGY ELLIPTA) 100-62.5-25 MCG/ACT AEPB Acetaminophen (TYLENOL 8 HOUR ARTHRITIS PAIN PO)   Has the patient contacted their pharmacy? Yes.   Denise with pharmacy is calling in for refill request.   Preferred Pharmacy (with phone number or street name):  [facility] [place], [place] - [address] Phone: [phone]  Fax: [phone]     Has the patient been seen for an appointment in the last year OR does the patient have an upcoming appointment? Yes.    Agent: Please be advised that RX refills may take up to 3 business days. We ask that you follow-up with your pharmacy.

## 2023-05-17 DIAGNOSIS — Z5982 Transportation insecurity: Secondary | ICD-10-CM | POA: Diagnosis not present

## 2023-05-17 DIAGNOSIS — M069 Rheumatoid arthritis, unspecified: Secondary | ICD-10-CM | POA: Diagnosis not present

## 2023-05-17 DIAGNOSIS — D649 Anemia, unspecified: Secondary | ICD-10-CM | POA: Diagnosis not present

## 2023-05-17 DIAGNOSIS — Z96611 Presence of right artificial shoulder joint: Secondary | ICD-10-CM | POA: Diagnosis not present

## 2023-05-17 DIAGNOSIS — R000 Tachycardia, unspecified: Secondary | ICD-10-CM | POA: Diagnosis not present

## 2023-05-17 DIAGNOSIS — E1140 Type 2 diabetes mellitus with diabetic neuropathy, unspecified: Secondary | ICD-10-CM | POA: Diagnosis not present

## 2023-05-17 DIAGNOSIS — J4489 Other specified chronic obstructive pulmonary disease: Secondary | ICD-10-CM | POA: Diagnosis not present

## 2023-05-17 DIAGNOSIS — I69344 Monoplegia of lower limb following cerebral infarction affecting left non-dominant side: Secondary | ICD-10-CM | POA: Diagnosis not present

## 2023-05-17 DIAGNOSIS — I700 Atherosclerosis of aorta: Secondary | ICD-10-CM | POA: Diagnosis not present

## 2023-05-17 DIAGNOSIS — Z556 Problems related to health literacy: Secondary | ICD-10-CM | POA: Diagnosis not present

## 2023-05-17 DIAGNOSIS — I951 Orthostatic hypotension: Secondary | ICD-10-CM | POA: Diagnosis not present

## 2023-05-17 DIAGNOSIS — I10 Essential (primary) hypertension: Secondary | ICD-10-CM | POA: Diagnosis not present

## 2023-05-17 DIAGNOSIS — F17210 Nicotine dependence, cigarettes, uncomplicated: Secondary | ICD-10-CM | POA: Diagnosis not present

## 2023-05-17 DIAGNOSIS — Z471 Aftercare following joint replacement surgery: Secondary | ICD-10-CM | POA: Diagnosis not present

## 2023-05-17 DIAGNOSIS — Z7951 Long term (current) use of inhaled steroids: Secondary | ICD-10-CM | POA: Diagnosis not present

## 2023-05-17 DIAGNOSIS — E785 Hyperlipidemia, unspecified: Secondary | ICD-10-CM | POA: Diagnosis not present

## 2023-05-17 DIAGNOSIS — K219 Gastro-esophageal reflux disease without esophagitis: Secondary | ICD-10-CM | POA: Diagnosis not present

## 2023-05-17 DIAGNOSIS — G4700 Insomnia, unspecified: Secondary | ICD-10-CM | POA: Diagnosis not present

## 2023-05-17 DIAGNOSIS — H25012 Cortical age-related cataract, left eye: Secondary | ICD-10-CM | POA: Diagnosis not present

## 2023-05-17 DIAGNOSIS — H409 Unspecified glaucoma: Secondary | ICD-10-CM | POA: Diagnosis not present

## 2023-05-17 DIAGNOSIS — H25011 Cortical age-related cataract, right eye: Secondary | ICD-10-CM | POA: Diagnosis not present

## 2023-05-17 DIAGNOSIS — M8580 Other specified disorders of bone density and structure, unspecified site: Secondary | ICD-10-CM | POA: Diagnosis not present

## 2023-05-17 DIAGNOSIS — M4802 Spinal stenosis, cervical region: Secondary | ICD-10-CM | POA: Diagnosis not present

## 2023-05-17 MED ORDER — ATORVASTATIN CALCIUM 40 MG PO TABS
ORAL_TABLET | ORAL | 1 refills | Status: DC
Start: 2023-05-17 — End: 2023-09-26

## 2023-05-17 MED ORDER — TRELEGY ELLIPTA 100-62.5-25 MCG/ACT IN AEPB: 1.0000 | INHALATION_SPRAY | Freq: Every evening | RESPIRATORY_TRACT | 5 refills | Status: DC

## 2023-05-17 MED ORDER — ACETAMINOPHEN ER 650 MG PO TBCR: 650.0000 mg | EXTENDED_RELEASE_TABLET | Freq: Three times a day (TID) | ORAL | 1 refills | Status: DC | PRN

## 2023-05-17 MED ORDER — CALCIUM CARBONATE 600 MG PO TABS: 600.0000 mg | ORAL_TABLET | Freq: Every day | ORAL | 1 refills | Status: DC

## 2023-05-17 NOTE — Telephone Encounter (Signed)
Aspirin EC 81 MG tablet can not be refilled, needs new script, routing for approval.  Other requested medications sent to nurse triage for refill.

## 2023-05-18 ENCOUNTER — Other Ambulatory Visit: Payer: Self-pay | Admitting: Internal Medicine

## 2023-05-18 MED ORDER — ASPIRIN 81 MG PO TBEC: 81.0000 mg | DELAYED_RELEASE_TABLET | Freq: Every day | ORAL | 1 refills | Status: DC

## 2023-05-18 NOTE — Telephone Encounter (Signed)
Medication Refill - Medication: aspirin EC 81 MG tablet   Denise from [facility] called to report that this medication was requested days ago   Has the patient contacted their pharmacy? Yes.   (Agent: If no, request that the patient contact the pharmacy for the refill. If patient does not wish to contact the pharmacy document the reason why and proceed with request.) (Agent: If yes, when and what did the pharmacy advise?)  Preferred Pharmacy (with phone number or street name):  [facility] [place], [place] - [address]  [address] [place] [address]  Phone: [phone] Fax: [phone]   Has the patient been seen for an appointment in the last year OR does the patient have an upcoming appointment? Yes.    Agent: Please be advised that RX refills may take up to 3 business days. We ask that you follow-up with your pharmacy.

## 2023-05-23 DIAGNOSIS — I700 Atherosclerosis of aorta: Secondary | ICD-10-CM | POA: Diagnosis not present

## 2023-05-23 DIAGNOSIS — Z7951 Long term (current) use of inhaled steroids: Secondary | ICD-10-CM | POA: Diagnosis not present

## 2023-05-23 DIAGNOSIS — G4700 Insomnia, unspecified: Secondary | ICD-10-CM | POA: Diagnosis not present

## 2023-05-23 DIAGNOSIS — K219 Gastro-esophageal reflux disease without esophagitis: Secondary | ICD-10-CM | POA: Diagnosis not present

## 2023-05-23 DIAGNOSIS — I951 Orthostatic hypotension: Secondary | ICD-10-CM | POA: Diagnosis not present

## 2023-05-23 DIAGNOSIS — H25011 Cortical age-related cataract, right eye: Secondary | ICD-10-CM | POA: Diagnosis not present

## 2023-05-23 DIAGNOSIS — H25012 Cortical age-related cataract, left eye: Secondary | ICD-10-CM | POA: Diagnosis not present

## 2023-05-23 DIAGNOSIS — H409 Unspecified glaucoma: Secondary | ICD-10-CM | POA: Diagnosis not present

## 2023-05-23 DIAGNOSIS — M8580 Other specified disorders of bone density and structure, unspecified site: Secondary | ICD-10-CM | POA: Diagnosis not present

## 2023-05-23 DIAGNOSIS — J4489 Other specified chronic obstructive pulmonary disease: Secondary | ICD-10-CM | POA: Diagnosis not present

## 2023-05-23 DIAGNOSIS — F17210 Nicotine dependence, cigarettes, uncomplicated: Secondary | ICD-10-CM | POA: Diagnosis not present

## 2023-05-23 DIAGNOSIS — Z556 Problems related to health literacy: Secondary | ICD-10-CM | POA: Diagnosis not present

## 2023-05-23 DIAGNOSIS — M069 Rheumatoid arthritis, unspecified: Secondary | ICD-10-CM | POA: Diagnosis not present

## 2023-05-23 DIAGNOSIS — I10 Essential (primary) hypertension: Secondary | ICD-10-CM | POA: Diagnosis not present

## 2023-05-23 DIAGNOSIS — E785 Hyperlipidemia, unspecified: Secondary | ICD-10-CM | POA: Diagnosis not present

## 2023-05-23 DIAGNOSIS — Z471 Aftercare following joint replacement surgery: Secondary | ICD-10-CM | POA: Diagnosis not present

## 2023-05-23 DIAGNOSIS — Z5982 Transportation insecurity: Secondary | ICD-10-CM | POA: Diagnosis not present

## 2023-05-23 DIAGNOSIS — I69344 Monoplegia of lower limb following cerebral infarction affecting left non-dominant side: Secondary | ICD-10-CM | POA: Diagnosis not present

## 2023-05-23 DIAGNOSIS — Z96611 Presence of right artificial shoulder joint: Secondary | ICD-10-CM | POA: Diagnosis not present

## 2023-05-23 DIAGNOSIS — R000 Tachycardia, unspecified: Secondary | ICD-10-CM | POA: Diagnosis not present

## 2023-05-23 DIAGNOSIS — D649 Anemia, unspecified: Secondary | ICD-10-CM | POA: Diagnosis not present

## 2023-05-23 DIAGNOSIS — M4802 Spinal stenosis, cervical region: Secondary | ICD-10-CM | POA: Diagnosis not present

## 2023-05-23 DIAGNOSIS — E1140 Type 2 diabetes mellitus with diabetic neuropathy, unspecified: Secondary | ICD-10-CM | POA: Diagnosis not present

## 2023-05-24 DIAGNOSIS — Z7951 Long term (current) use of inhaled steroids: Secondary | ICD-10-CM | POA: Diagnosis not present

## 2023-05-24 DIAGNOSIS — M8580 Other specified disorders of bone density and structure, unspecified site: Secondary | ICD-10-CM | POA: Diagnosis not present

## 2023-05-24 DIAGNOSIS — I700 Atherosclerosis of aorta: Secondary | ICD-10-CM | POA: Diagnosis not present

## 2023-05-24 DIAGNOSIS — Z471 Aftercare following joint replacement surgery: Secondary | ICD-10-CM | POA: Diagnosis not present

## 2023-05-24 DIAGNOSIS — M069 Rheumatoid arthritis, unspecified: Secondary | ICD-10-CM | POA: Diagnosis not present

## 2023-05-24 DIAGNOSIS — G4700 Insomnia, unspecified: Secondary | ICD-10-CM | POA: Diagnosis not present

## 2023-05-24 DIAGNOSIS — D649 Anemia, unspecified: Secondary | ICD-10-CM | POA: Diagnosis not present

## 2023-05-24 DIAGNOSIS — M4802 Spinal stenosis, cervical region: Secondary | ICD-10-CM | POA: Diagnosis not present

## 2023-05-24 DIAGNOSIS — Z5982 Transportation insecurity: Secondary | ICD-10-CM | POA: Diagnosis not present

## 2023-05-24 DIAGNOSIS — H25011 Cortical age-related cataract, right eye: Secondary | ICD-10-CM | POA: Diagnosis not present

## 2023-05-24 DIAGNOSIS — Z556 Problems related to health literacy: Secondary | ICD-10-CM | POA: Diagnosis not present

## 2023-05-24 DIAGNOSIS — J4489 Other specified chronic obstructive pulmonary disease: Secondary | ICD-10-CM | POA: Diagnosis not present

## 2023-05-24 DIAGNOSIS — I10 Essential (primary) hypertension: Secondary | ICD-10-CM | POA: Diagnosis not present

## 2023-05-24 DIAGNOSIS — Z96611 Presence of right artificial shoulder joint: Secondary | ICD-10-CM | POA: Diagnosis not present

## 2023-05-24 DIAGNOSIS — H409 Unspecified glaucoma: Secondary | ICD-10-CM | POA: Diagnosis not present

## 2023-05-24 DIAGNOSIS — R000 Tachycardia, unspecified: Secondary | ICD-10-CM | POA: Diagnosis not present

## 2023-05-24 DIAGNOSIS — E785 Hyperlipidemia, unspecified: Secondary | ICD-10-CM | POA: Diagnosis not present

## 2023-05-24 DIAGNOSIS — K219 Gastro-esophageal reflux disease without esophagitis: Secondary | ICD-10-CM | POA: Diagnosis not present

## 2023-05-24 DIAGNOSIS — H25012 Cortical age-related cataract, left eye: Secondary | ICD-10-CM | POA: Diagnosis not present

## 2023-05-24 DIAGNOSIS — E1140 Type 2 diabetes mellitus with diabetic neuropathy, unspecified: Secondary | ICD-10-CM | POA: Diagnosis not present

## 2023-05-24 DIAGNOSIS — I69344 Monoplegia of lower limb following cerebral infarction affecting left non-dominant side: Secondary | ICD-10-CM | POA: Diagnosis not present

## 2023-05-24 DIAGNOSIS — I951 Orthostatic hypotension: Secondary | ICD-10-CM | POA: Diagnosis not present

## 2023-05-24 DIAGNOSIS — F17210 Nicotine dependence, cigarettes, uncomplicated: Secondary | ICD-10-CM | POA: Diagnosis not present

## 2023-05-25 DIAGNOSIS — M19011 Primary osteoarthritis, right shoulder: Secondary | ICD-10-CM | POA: Diagnosis not present

## 2023-05-25 DIAGNOSIS — M7581 Other shoulder lesions, right shoulder: Secondary | ICD-10-CM | POA: Diagnosis not present

## 2023-05-25 DIAGNOSIS — Z471 Aftercare following joint replacement surgery: Secondary | ICD-10-CM | POA: Diagnosis not present

## 2023-05-25 DIAGNOSIS — Z96611 Presence of right artificial shoulder joint: Secondary | ICD-10-CM | POA: Diagnosis not present

## 2023-05-28 DIAGNOSIS — H409 Unspecified glaucoma: Secondary | ICD-10-CM | POA: Diagnosis not present

## 2023-05-28 DIAGNOSIS — R000 Tachycardia, unspecified: Secondary | ICD-10-CM | POA: Diagnosis not present

## 2023-05-28 DIAGNOSIS — J4489 Other specified chronic obstructive pulmonary disease: Secondary | ICD-10-CM | POA: Diagnosis not present

## 2023-05-28 DIAGNOSIS — I10 Essential (primary) hypertension: Secondary | ICD-10-CM | POA: Diagnosis not present

## 2023-05-28 DIAGNOSIS — H25011 Cortical age-related cataract, right eye: Secondary | ICD-10-CM | POA: Diagnosis not present

## 2023-05-28 DIAGNOSIS — Z471 Aftercare following joint replacement surgery: Secondary | ICD-10-CM | POA: Diagnosis not present

## 2023-05-28 DIAGNOSIS — K219 Gastro-esophageal reflux disease without esophagitis: Secondary | ICD-10-CM | POA: Diagnosis not present

## 2023-05-28 DIAGNOSIS — Z7951 Long term (current) use of inhaled steroids: Secondary | ICD-10-CM | POA: Diagnosis not present

## 2023-05-28 DIAGNOSIS — M8580 Other specified disorders of bone density and structure, unspecified site: Secondary | ICD-10-CM | POA: Diagnosis not present

## 2023-05-28 DIAGNOSIS — H25012 Cortical age-related cataract, left eye: Secondary | ICD-10-CM | POA: Diagnosis not present

## 2023-05-28 DIAGNOSIS — E785 Hyperlipidemia, unspecified: Secondary | ICD-10-CM | POA: Diagnosis not present

## 2023-05-28 DIAGNOSIS — M069 Rheumatoid arthritis, unspecified: Secondary | ICD-10-CM | POA: Diagnosis not present

## 2023-05-28 DIAGNOSIS — F17210 Nicotine dependence, cigarettes, uncomplicated: Secondary | ICD-10-CM | POA: Diagnosis not present

## 2023-05-28 DIAGNOSIS — Z556 Problems related to health literacy: Secondary | ICD-10-CM | POA: Diagnosis not present

## 2023-05-28 DIAGNOSIS — Z96611 Presence of right artificial shoulder joint: Secondary | ICD-10-CM | POA: Diagnosis not present

## 2023-05-28 DIAGNOSIS — M4802 Spinal stenosis, cervical region: Secondary | ICD-10-CM | POA: Diagnosis not present

## 2023-05-28 DIAGNOSIS — I700 Atherosclerosis of aorta: Secondary | ICD-10-CM | POA: Diagnosis not present

## 2023-05-28 DIAGNOSIS — G4700 Insomnia, unspecified: Secondary | ICD-10-CM | POA: Diagnosis not present

## 2023-05-28 DIAGNOSIS — Z5982 Transportation insecurity: Secondary | ICD-10-CM | POA: Diagnosis not present

## 2023-05-28 DIAGNOSIS — D649 Anemia, unspecified: Secondary | ICD-10-CM | POA: Diagnosis not present

## 2023-05-28 DIAGNOSIS — I69344 Monoplegia of lower limb following cerebral infarction affecting left non-dominant side: Secondary | ICD-10-CM | POA: Diagnosis not present

## 2023-05-28 DIAGNOSIS — I951 Orthostatic hypotension: Secondary | ICD-10-CM | POA: Diagnosis not present

## 2023-05-28 DIAGNOSIS — E1140 Type 2 diabetes mellitus with diabetic neuropathy, unspecified: Secondary | ICD-10-CM | POA: Diagnosis not present

## 2023-05-29 DIAGNOSIS — Z556 Problems related to health literacy: Secondary | ICD-10-CM | POA: Diagnosis not present

## 2023-05-29 DIAGNOSIS — M069 Rheumatoid arthritis, unspecified: Secondary | ICD-10-CM | POA: Diagnosis not present

## 2023-05-29 DIAGNOSIS — H409 Unspecified glaucoma: Secondary | ICD-10-CM | POA: Diagnosis not present

## 2023-05-29 DIAGNOSIS — Z96611 Presence of right artificial shoulder joint: Secondary | ICD-10-CM | POA: Diagnosis not present

## 2023-05-29 DIAGNOSIS — M4802 Spinal stenosis, cervical region: Secondary | ICD-10-CM | POA: Diagnosis not present

## 2023-05-29 DIAGNOSIS — I700 Atherosclerosis of aorta: Secondary | ICD-10-CM | POA: Diagnosis not present

## 2023-05-29 DIAGNOSIS — I951 Orthostatic hypotension: Secondary | ICD-10-CM | POA: Diagnosis not present

## 2023-05-29 DIAGNOSIS — J4489 Other specified chronic obstructive pulmonary disease: Secondary | ICD-10-CM | POA: Diagnosis not present

## 2023-05-29 DIAGNOSIS — D649 Anemia, unspecified: Secondary | ICD-10-CM | POA: Diagnosis not present

## 2023-05-29 DIAGNOSIS — F17210 Nicotine dependence, cigarettes, uncomplicated: Secondary | ICD-10-CM | POA: Diagnosis not present

## 2023-05-29 DIAGNOSIS — I69344 Monoplegia of lower limb following cerebral infarction affecting left non-dominant side: Secondary | ICD-10-CM | POA: Diagnosis not present

## 2023-05-29 DIAGNOSIS — K219 Gastro-esophageal reflux disease without esophagitis: Secondary | ICD-10-CM | POA: Diagnosis not present

## 2023-05-29 DIAGNOSIS — H25011 Cortical age-related cataract, right eye: Secondary | ICD-10-CM | POA: Diagnosis not present

## 2023-05-29 DIAGNOSIS — I10 Essential (primary) hypertension: Secondary | ICD-10-CM | POA: Diagnosis not present

## 2023-05-29 DIAGNOSIS — Z7951 Long term (current) use of inhaled steroids: Secondary | ICD-10-CM | POA: Diagnosis not present

## 2023-05-29 DIAGNOSIS — Z471 Aftercare following joint replacement surgery: Secondary | ICD-10-CM | POA: Diagnosis not present

## 2023-05-29 DIAGNOSIS — H25012 Cortical age-related cataract, left eye: Secondary | ICD-10-CM | POA: Diagnosis not present

## 2023-05-29 DIAGNOSIS — R000 Tachycardia, unspecified: Secondary | ICD-10-CM | POA: Diagnosis not present

## 2023-05-29 DIAGNOSIS — E1140 Type 2 diabetes mellitus with diabetic neuropathy, unspecified: Secondary | ICD-10-CM | POA: Diagnosis not present

## 2023-05-29 DIAGNOSIS — E785 Hyperlipidemia, unspecified: Secondary | ICD-10-CM | POA: Diagnosis not present

## 2023-05-29 DIAGNOSIS — M8580 Other specified disorders of bone density and structure, unspecified site: Secondary | ICD-10-CM | POA: Diagnosis not present

## 2023-05-29 DIAGNOSIS — G4700 Insomnia, unspecified: Secondary | ICD-10-CM | POA: Diagnosis not present

## 2023-05-29 DIAGNOSIS — Z5982 Transportation insecurity: Secondary | ICD-10-CM | POA: Diagnosis not present

## 2023-05-30 DIAGNOSIS — M069 Rheumatoid arthritis, unspecified: Secondary | ICD-10-CM | POA: Diagnosis not present

## 2023-05-30 DIAGNOSIS — Z556 Problems related to health literacy: Secondary | ICD-10-CM | POA: Diagnosis not present

## 2023-05-30 DIAGNOSIS — E785 Hyperlipidemia, unspecified: Secondary | ICD-10-CM | POA: Diagnosis not present

## 2023-05-30 DIAGNOSIS — H409 Unspecified glaucoma: Secondary | ICD-10-CM | POA: Diagnosis not present

## 2023-05-30 DIAGNOSIS — E1140 Type 2 diabetes mellitus with diabetic neuropathy, unspecified: Secondary | ICD-10-CM | POA: Diagnosis not present

## 2023-05-30 DIAGNOSIS — Z96611 Presence of right artificial shoulder joint: Secondary | ICD-10-CM | POA: Diagnosis not present

## 2023-05-30 DIAGNOSIS — M8580 Other specified disorders of bone density and structure, unspecified site: Secondary | ICD-10-CM | POA: Diagnosis not present

## 2023-05-30 DIAGNOSIS — R000 Tachycardia, unspecified: Secondary | ICD-10-CM | POA: Diagnosis not present

## 2023-05-30 DIAGNOSIS — F17210 Nicotine dependence, cigarettes, uncomplicated: Secondary | ICD-10-CM | POA: Diagnosis not present

## 2023-05-30 DIAGNOSIS — Z7951 Long term (current) use of inhaled steroids: Secondary | ICD-10-CM | POA: Diagnosis not present

## 2023-05-30 DIAGNOSIS — I10 Essential (primary) hypertension: Secondary | ICD-10-CM | POA: Diagnosis not present

## 2023-05-30 DIAGNOSIS — Z5982 Transportation insecurity: Secondary | ICD-10-CM | POA: Diagnosis not present

## 2023-05-30 DIAGNOSIS — J4489 Other specified chronic obstructive pulmonary disease: Secondary | ICD-10-CM | POA: Diagnosis not present

## 2023-05-30 DIAGNOSIS — I951 Orthostatic hypotension: Secondary | ICD-10-CM | POA: Diagnosis not present

## 2023-05-30 DIAGNOSIS — I700 Atherosclerosis of aorta: Secondary | ICD-10-CM | POA: Diagnosis not present

## 2023-05-30 DIAGNOSIS — I69344 Monoplegia of lower limb following cerebral infarction affecting left non-dominant side: Secondary | ICD-10-CM | POA: Diagnosis not present

## 2023-05-30 DIAGNOSIS — K219 Gastro-esophageal reflux disease without esophagitis: Secondary | ICD-10-CM | POA: Diagnosis not present

## 2023-05-30 DIAGNOSIS — H25012 Cortical age-related cataract, left eye: Secondary | ICD-10-CM | POA: Diagnosis not present

## 2023-05-30 DIAGNOSIS — M4802 Spinal stenosis, cervical region: Secondary | ICD-10-CM | POA: Diagnosis not present

## 2023-05-30 DIAGNOSIS — H25011 Cortical age-related cataract, right eye: Secondary | ICD-10-CM | POA: Diagnosis not present

## 2023-05-30 DIAGNOSIS — G4700 Insomnia, unspecified: Secondary | ICD-10-CM | POA: Diagnosis not present

## 2023-05-30 DIAGNOSIS — Z471 Aftercare following joint replacement surgery: Secondary | ICD-10-CM | POA: Diagnosis not present

## 2023-05-30 DIAGNOSIS — D649 Anemia, unspecified: Secondary | ICD-10-CM | POA: Diagnosis not present

## 2023-06-05 DIAGNOSIS — G4700 Insomnia, unspecified: Secondary | ICD-10-CM | POA: Diagnosis not present

## 2023-06-05 DIAGNOSIS — E785 Hyperlipidemia, unspecified: Secondary | ICD-10-CM | POA: Diagnosis not present

## 2023-06-05 DIAGNOSIS — D649 Anemia, unspecified: Secondary | ICD-10-CM | POA: Diagnosis not present

## 2023-06-05 DIAGNOSIS — M8580 Other specified disorders of bone density and structure, unspecified site: Secondary | ICD-10-CM | POA: Diagnosis not present

## 2023-06-05 DIAGNOSIS — I10 Essential (primary) hypertension: Secondary | ICD-10-CM | POA: Diagnosis not present

## 2023-06-05 DIAGNOSIS — M4802 Spinal stenosis, cervical region: Secondary | ICD-10-CM | POA: Diagnosis not present

## 2023-06-05 DIAGNOSIS — J4489 Other specified chronic obstructive pulmonary disease: Secondary | ICD-10-CM | POA: Diagnosis not present

## 2023-06-05 DIAGNOSIS — I700 Atherosclerosis of aorta: Secondary | ICD-10-CM | POA: Diagnosis not present

## 2023-06-05 DIAGNOSIS — K219 Gastro-esophageal reflux disease without esophagitis: Secondary | ICD-10-CM | POA: Diagnosis not present

## 2023-06-05 DIAGNOSIS — H409 Unspecified glaucoma: Secondary | ICD-10-CM | POA: Diagnosis not present

## 2023-06-05 DIAGNOSIS — Z5982 Transportation insecurity: Secondary | ICD-10-CM | POA: Diagnosis not present

## 2023-06-05 DIAGNOSIS — R000 Tachycardia, unspecified: Secondary | ICD-10-CM | POA: Diagnosis not present

## 2023-06-05 DIAGNOSIS — Z471 Aftercare following joint replacement surgery: Secondary | ICD-10-CM | POA: Diagnosis not present

## 2023-06-05 DIAGNOSIS — F17210 Nicotine dependence, cigarettes, uncomplicated: Secondary | ICD-10-CM | POA: Diagnosis not present

## 2023-06-05 DIAGNOSIS — I951 Orthostatic hypotension: Secondary | ICD-10-CM | POA: Diagnosis not present

## 2023-06-05 DIAGNOSIS — M069 Rheumatoid arthritis, unspecified: Secondary | ICD-10-CM | POA: Diagnosis not present

## 2023-06-05 DIAGNOSIS — H25012 Cortical age-related cataract, left eye: Secondary | ICD-10-CM | POA: Diagnosis not present

## 2023-06-05 DIAGNOSIS — Z96611 Presence of right artificial shoulder joint: Secondary | ICD-10-CM | POA: Diagnosis not present

## 2023-06-05 DIAGNOSIS — Z556 Problems related to health literacy: Secondary | ICD-10-CM | POA: Diagnosis not present

## 2023-06-05 DIAGNOSIS — I69344 Monoplegia of lower limb following cerebral infarction affecting left non-dominant side: Secondary | ICD-10-CM | POA: Diagnosis not present

## 2023-06-05 DIAGNOSIS — E1140 Type 2 diabetes mellitus with diabetic neuropathy, unspecified: Secondary | ICD-10-CM | POA: Diagnosis not present

## 2023-06-05 DIAGNOSIS — H25011 Cortical age-related cataract, right eye: Secondary | ICD-10-CM | POA: Diagnosis not present

## 2023-06-05 DIAGNOSIS — Z7951 Long term (current) use of inhaled steroids: Secondary | ICD-10-CM | POA: Diagnosis not present

## 2023-06-06 DIAGNOSIS — F17210 Nicotine dependence, cigarettes, uncomplicated: Secondary | ICD-10-CM | POA: Diagnosis not present

## 2023-06-06 DIAGNOSIS — J4489 Other specified chronic obstructive pulmonary disease: Secondary | ICD-10-CM | POA: Diagnosis not present

## 2023-06-06 DIAGNOSIS — M069 Rheumatoid arthritis, unspecified: Secondary | ICD-10-CM | POA: Diagnosis not present

## 2023-06-06 DIAGNOSIS — M8580 Other specified disorders of bone density and structure, unspecified site: Secondary | ICD-10-CM | POA: Diagnosis not present

## 2023-06-06 DIAGNOSIS — Z556 Problems related to health literacy: Secondary | ICD-10-CM | POA: Diagnosis not present

## 2023-06-06 DIAGNOSIS — E1140 Type 2 diabetes mellitus with diabetic neuropathy, unspecified: Secondary | ICD-10-CM | POA: Diagnosis not present

## 2023-06-06 DIAGNOSIS — Z471 Aftercare following joint replacement surgery: Secondary | ICD-10-CM | POA: Diagnosis not present

## 2023-06-06 DIAGNOSIS — I700 Atherosclerosis of aorta: Secondary | ICD-10-CM | POA: Diagnosis not present

## 2023-06-06 DIAGNOSIS — H409 Unspecified glaucoma: Secondary | ICD-10-CM | POA: Diagnosis not present

## 2023-06-06 DIAGNOSIS — E785 Hyperlipidemia, unspecified: Secondary | ICD-10-CM | POA: Diagnosis not present

## 2023-06-06 DIAGNOSIS — I69344 Monoplegia of lower limb following cerebral infarction affecting left non-dominant side: Secondary | ICD-10-CM | POA: Diagnosis not present

## 2023-06-06 DIAGNOSIS — I10 Essential (primary) hypertension: Secondary | ICD-10-CM | POA: Diagnosis not present

## 2023-06-06 DIAGNOSIS — K219 Gastro-esophageal reflux disease without esophagitis: Secondary | ICD-10-CM | POA: Diagnosis not present

## 2023-06-06 DIAGNOSIS — D649 Anemia, unspecified: Secondary | ICD-10-CM | POA: Diagnosis not present

## 2023-06-06 DIAGNOSIS — M4802 Spinal stenosis, cervical region: Secondary | ICD-10-CM | POA: Diagnosis not present

## 2023-06-06 DIAGNOSIS — I951 Orthostatic hypotension: Secondary | ICD-10-CM | POA: Diagnosis not present

## 2023-06-06 DIAGNOSIS — Z7951 Long term (current) use of inhaled steroids: Secondary | ICD-10-CM | POA: Diagnosis not present

## 2023-06-06 DIAGNOSIS — H25012 Cortical age-related cataract, left eye: Secondary | ICD-10-CM | POA: Diagnosis not present

## 2023-06-06 DIAGNOSIS — Z96611 Presence of right artificial shoulder joint: Secondary | ICD-10-CM | POA: Diagnosis not present

## 2023-06-06 DIAGNOSIS — Z5982 Transportation insecurity: Secondary | ICD-10-CM | POA: Diagnosis not present

## 2023-06-06 DIAGNOSIS — H25011 Cortical age-related cataract, right eye: Secondary | ICD-10-CM | POA: Diagnosis not present

## 2023-06-06 DIAGNOSIS — G4700 Insomnia, unspecified: Secondary | ICD-10-CM | POA: Diagnosis not present

## 2023-06-06 DIAGNOSIS — R000 Tachycardia, unspecified: Secondary | ICD-10-CM | POA: Diagnosis not present

## 2023-06-07 ENCOUNTER — Ambulatory Visit (INDEPENDENT_AMBULATORY_CARE_PROVIDER_SITE_OTHER): Payer: 59

## 2023-06-07 DIAGNOSIS — Z0000 Encounter for general adult medical examination without abnormal findings: Secondary | ICD-10-CM | POA: Diagnosis not present

## 2023-06-07 NOTE — Patient Instructions (Addendum)
Kendra [name] , Thank you for taking time to come for your Medicare Wellness Visit. I appreciate your ongoing commitment to your health goals. Please review the following plan we discussed and let me know if I can assist you in the future.   Referrals/Orders/Follow-Ups/Clinician Recommendations: none  This is a list of the screening recommended for you and due dates:  Health Maintenance  Topic Date Due   Zoster (Shingles) Vaccine (1 of 2) 04/26/1967   Cologuard (Stool DNA test)  Never done   Eye exam for diabetics  07/30/2020   DTaP/Tdap/Td vaccine (2 - Td or Tdap) 08/04/2020   Flu Shot  04/19/2023   Hemoglobin A1C  10/04/2023   Yearly kidney health urinalysis for diabetes  01/02/2024   Complete foot exam   01/02/2024   Yearly kidney function blood test for diabetes  04/11/2024   Medicare Annual Wellness Visit  06/06/2024   Pneumonia Vaccine  Completed   DEXA scan (bone density measurement)  Completed   Hepatitis C Screening  Completed   HPV Vaccine  Aged Out   Colon Cancer Screening  Discontinued   COVID-19 Vaccine  Discontinued    Advanced directives: (In Chart) A copy of your advanced directives are scanned into your chart should your provider ever need it.  Next Medicare Annual Wellness Visit scheduled for next year: Yes   06/06/24 @ 1:30 pm in person

## 2023-06-07 NOTE — Progress Notes (Signed)
Subjective:   Kendra [name] is a 77 y.o. female who presents for Medicare Annual (Subsequent) preventive examination.  Visit Complete: Virtual  I connected with  [name] on 06/07/23 by a audio enabled telemedicine application and verified that I am speaking with the correct person using two identifiers.  Patient Location: Home  Provider Location: Office/Clinic  I discussed the limitations of evaluation and management by telemedicine. The patient expressed understanding and agreed to proceed.  Vital Signs: Unable to obtain new vitals due to this being a telehealth visit.  Cardiac Risk Factors include: advanced age (>85men, >24 women);dyslipidemia;sedentary lifestyle;smoking/ tobacco exposure;hypertension     Objective:    There were no vitals filed for this visit. There is no height or weight on file to calculate BMI.     06/07/2023    1:34 PM 04/12/2023    2:00 AM 04/11/2023    8:51 PM 04/10/2023    6:22 AM 04/04/2023    8:33 AM 08/29/2022    1:14 PM 06/23/2022   10:21 AM  Advanced Directives  Does Patient Have a Medical Advance Directive? Yes  No No No No No  Type of Estate agent of [place];Living will        Does patient want to make changes to medical advance directive? No - Patient declined        Copy of Healthcare Power of Attorney in Chart? Yes - validated most recent copy scanned in chart (See row information)        Would patient like information on creating a medical advance directive?  No - Patient declined  No - Patient declined  Yes (MAU/Ambulatory/Procedural Areas - Information given) No - Patient declined    Current Medications (verified) Outpatient Encounter Medications as of 06/07/2023  Medication Sig   acetaminophen (TYLENOL 8 HOUR ARTHRITIS PAIN) 650 MG CR tablet Take 1 tablet (650 mg total) by mouth 3 (three) times daily as needed for pain.   albuterol (VENTOLIN HFA) 108 (90 Base) MCG/ACT inhaler Inhale 1-2 puffs into  the lungs every 6 (six) hours as needed. INHALE 1-2 PUFFS BY MOUTH EVERY 6 HOURS AS NEEDED WHEEZING/ SHORTNESS OF BREATH   aspirin EC 81 MG tablet Take 1 tablet (81 mg total) by mouth daily. Swallow whole.   atorvastatin (LIPITOR) 40 MG tablet TAKE 1 TABLET BY MOUTH BEDTIME   buPROPion (WELLBUTRIN XL) 150 MG 24 hr tablet TAKE 1 TABLET BY MOUTH ONCE DAILY ** NEED TO KEEP UPCOMING APPOINTMENT!!1   calcium carbonate (CALCIUM 600) 600 MG TABS tablet Take 1 tablet (600 mg total) by mouth daily.   Fluticasone-Umeclidin-Vilant (TRELEGY ELLIPTA) 100-62.5-25 MCG/ACT AEPB Inhale 1 puff into the lungs at bedtime.   oxyCODONE (ROXICODONE) 5 MG immediate release tablet Take 1-2 tablets (5-10 mg total) by mouth every 4 (four) hours as needed for moderate pain or severe pain.   silver sulfADIAZINE (SILVADENE) 1 % cream Apply 1 Application topically daily.   traZODone (DESYREL) 50 MG tablet TAKE 1 TABLET BY MOUTH AT BEDTIME (Patient not taking: Reported on 06/07/2023)   No facility-administered encounter medications on file as of 06/07/2023.    Allergies (verified) Citalopram   History: Past Medical History:  Diagnosis Date   Anemia    Aortic atherosclerosis (HCC)    Asthma    Back pain    Bladder tumor    Cancer (HCC)    Carotid artery stenosis    Cataract    Cervical disc disorder    COPD (chronic  obstructive pulmonary disease) (HCC)    Depression    Diabetes (HCC)    type II   Diabetic neuropathy (HCC)    Dyspnea    with exertion   GERD (gastroesophageal reflux disease)    Hematuria    History of kidney stones    per patient "can't remember the year"   Hyperlipidemia    Hypertension    per patient "take meds for it"   Insomnia    Neuropathy    Osteoarthritis    Osteopenia    Pneumonia 2018   per patient   Reflux    Rheumatoid arthritis (HCC)    Stroke (HCC) 2015   and again 2017  - Weakness in left leg, staggers w/ walking, vision    Tenosynovitis    Past Surgical History:   Procedure Laterality Date   BLADDER INSTILLATION N/A 06/23/2022   Procedure: BLADDER INSTILLATION OF GEMCITABINE;  Surgeon: Sondra Come, MD;  Location: [facility] ORS;  Service: Urology;  Laterality: N/A;   BREAST BIOPSY Right    Fatty Tumor   ENDARTERECTOMY Right 10/21/2019   Procedure: ENDARTERECTOMY CAROTID RIGHT;  Surgeon: Maeola Harman, MD;  Location: [facility] OR;  Service: Vascular;  Laterality: Right;   ENDARTERECTOMY Left 12/30/2019   Procedure: LEFT CAROTID ENDARTERECTOMY;  Surgeon: Maeola Harman, MD;  Location[facility] OR;  Service: Vascular;  Laterality: Left;   NECK SURGERY     OVARIAN CYST REMOVAL     PATCH ANGIOPLASTY Right 10/21/2019   Procedure: Patch Angioplasty Using XenoSure Biologic Patch Right Carotid;  Surgeon: Maeola Harman, MD;  Location: [facility] OR;  Service: Vascular;  Laterality: Right;   PATCH ANGIOPLASTY Left 12/30/2019   Procedure: Patch Angioplasty Left Carotid;  Surgeon: Maeola Harman, MD;  Location: [facility] OR;  Service: Vascular;  Laterality: Left;   REVERSE SHOULDER ARTHROPLASTY Right 04/10/2023   Procedure: REVERSE SHOULDER ARTHROPLASTY WITH BICEPS TENODESIS;  Surgeon: Christena Flake, MD;  Location: [facility] ORS;  Service: Orthopedics;  Laterality: Right;  MAKE SECOND CASE OF THE DAY   TRANSURETHRAL RESECTION OF BLADDER TUMOR N/A 06/23/2022   Procedure: TRANSURETHRAL RESECTION OF BLADDER TUMOR (TURBT);  Surgeon: Sondra Come, MD;  Location: [facility] ORS;  Service: Urology;  Laterality: N/A;   Family History  Problem Relation Age of Onset   Stroke Father    Depression Father    Diabetes Mother    Hyperlipidemia Mother    Breast cancer Mother    Heart murmur Son    Heart murmur Son    Heart murmur Son    Social History   Socioeconomic History   Marital status: Widowed    Spouse name: Not on file   Number of children: 3   Years of education: 2   Highest education level: Not on file  Occupational History   Occupation: Disabled   Tobacco Use   Smoking status: Every Day    Current packs/day: 0.25    Average packs/day: 0.3 packs/day for 61.0 years (15.3 ttl pk-yrs)    Types: Cigarettes    Passive exposure: Current   Smokeless tobacco: Former    Types: Snuff  Vaping Use   Vaping status: Never Used  Substance and Sexual Activity   Alcohol use: No   Drug use: No   Sexual activity: Never  Other Topics Concern   Not on file  Social History Narrative   Not on file   Social Determinants of Health   Financial Resource Strain: Low Risk  (06/07/2023)  Overall Financial Resource Strain (CARDIA)    Difficulty of Paying Living Expenses: Not hard at all  Food Insecurity: No Food Insecurity (06/07/2023)   Hunger Vital Sign    Worried About Running Out of Food in the Last Year: Never true    Ran Out of Food in the Last Year: Never true  Transportation Needs: No Transportation Needs (06/07/2023)   PRAPARE - Administrator, Civil Service (Medical): No    Lack of Transportation (Non-Medical): No  Recent Concern: Transportation Needs - Unmet Transportation Needs (04/12/2023)   PRAPARE - Transportation    Lack of Transportation (Medical): Yes    Lack of Transportation (Non-Medical): Yes  Physical Activity: Sufficiently Active (06/07/2023)   Exercise Vital Sign    Days of Exercise per Week: 3 days    Minutes of Exercise per Session: 60 min  Stress: No Stress Concern Present (06/07/2023)   [employer] of Occupational Health - Occupational Stress Questionnaire    Feeling of Stress : Only a little  Social Connections: Socially Isolated (06/07/2023)   Social Connection and Isolation Panel [NHANES]    Frequency of Communication with Friends and Family: More than three times a week    Frequency of Social Gatherings with Friends and Family: Once a week    Attends Religious Services: Never    Database administrator or Organizations: No    Attends Banker Meetings: Never    Marital Status: Widowed     Tobacco Counseling Ready to quit: Not Answered Counseling given: Not Answered   Clinical Intake:  Pre-visit preparation completed: Yes  Pain : No/denies pain     Nutritional Status: BMI of 19-24  Normal Nutritional Risks: None Diabetes: No  How often do you need to have someone help you when you read instructions, pamphlets, or other written materials from your doctor or pharmacy?: 1 - Never  Interpreter Needed?: No  Information entered by :: Kennedy Bucker, LPN   Activities of Daily Living    06/07/2023    1:36 PM 04/12/2023    1:56 AM  In your present state of health, do you have any difficulty performing the following activities:  Hearing? 0 0  Vision? 0 0  Difficulty concentrating or making decisions? 0 0  Walking or climbing stairs? 1 1  Dressing or bathing? 0 1  Doing errands, shopping? 0 1  Preparing Food and eating ? N   Using the Toilet? N   In the past six months, have you accidently leaked urine? N   Do you have problems with loss of bowel control? N   Managing your Medications? N   Managing your Finances? N   Housekeeping or managing your Housekeeping? N     Patient Care Team: [name], NP as PCP - General (Internal Medicine) Alba Cory, MD as Attending Physician (Family Medicine) Kieth Brightly, MD (General Surgery) Malfi, Jodelle Gross, FNP as Nurse Practitioner (Family Medicine) Bridgett Larsson, LCSW as Social Worker (Licensed Clinical Social Worker)  Indicate any recent Medical Services you may have received from other than [facility] providers in the past year (date may be approximate).     Assessment:   This is a routine wellness examination for Jahaira.  Hearing/Vision screen Hearing Screening - Comments:: No aids Vision Screening - Comments:: Wears glasses- MD in [place]    Goals Addressed             This Visit's Progress    DIET - EAT MORE  FRUITS AND VEGETABLES         Depression Screen    06/07/2023     1:32 PM 04/04/2023    7:06 AM 01/02/2023    2:51 PM 01/04/2022    2:36 PM 10/06/2021   11:44 AM 05/31/2021    2:10 PM 12/21/2020   10:29 AM  PHQ 2/9 Scores  PHQ - 2 Score 4 1 2 3 1 1 1   PHQ- 9 Score 8  9 9  4 9     Fall Risk    06/07/2023    1:35 PM 04/04/2023    7:06 AM 01/02/2023    2:52 PM 02/22/2021   11:12 AM 11/04/2019    2:17 PM  Fall Risk   Falls in the past year? 1 1 0 1 1  Number falls in past yr: 0 0  0 1  Injury with Fall? 0 0 0 1 1  Risk for fall due to : History of fall(s) Impaired balance/gait No Fall Risks Impaired balance/gait Impaired balance/gait;Impaired mobility  Follow up Falls prevention discussed;Falls evaluation completed   Falls evaluation completed Education provided    MEDICARE RISK AT HOME: Medicare Risk at Home Any stairs in or around the home?: No If so, are there any without handrails?: No Home free of loose throw rugs in walkways, pet beds, electrical cords, etc?: Yes Adequate lighting in your home to reduce risk of falls?: Yes Life alert?: No Use of a cane, walker or w/c?: Yes (cane) Grab bars in the bathroom?: Yes Shower chair or bench in shower?: Yes Elevated toilet seat or a handicapped toilet?: No  TIMED UP AND GO:  Was the test performed?  No    Cognitive Function:        06/07/2023    1:37 PM 01/04/2022    2:33 PM  6CIT Screen  What Year? 0 points 0 points  What month? 0 points 0 points  What time? 0 points 0 points  Count back from 20 0 points 0 points  Months in reverse 4 points 0 points  Repeat phrase 4 points 0 points  Total Score 8 points 0 points    Immunizations Immunization History  Administered Date(s) Administered   Fluad Quad(high Dose 65+) 05/21/2019, 05/31/2021, 08/29/2022   Influenza Split 06/20/2012   Influenza, High Dose Seasonal PF 07/26/2016, 06/14/2017, 07/31/2018   Influenza, Seasonal, Injecte, Preservative Fre 08/04/2010   Influenza,inj,Quad PF,6+ Mos 06/10/2013, 05/26/2014, 06/07/2015    Influenza-Unspecified 05/26/2014, 06/18/2018   PFIZER(Purple Top)SARS-COV-2 Vaccination 03/02/2020, 03/30/2020   Pneumococcal Conjugate-13 10/31/2007, 10/21/2014   Pneumococcal Polysaccharide-23 10/31/2007, 06/20/2012, 07/31/2018   Tdap 08/04/2010   Zoster, Live 06/10/2013    TDAP status: Due, Education has been provided regarding the importance of this vaccine. Advised may receive this vaccine at local pharmacy or Health Dept. Aware to provide a copy of the vaccination record if obtained from local pharmacy or Health Dept. Verbalized acceptance and understanding.  Flu Vaccine status: Up to date  Pneumococcal vaccine status: Up to date  Covid-19 vaccine status: Completed vaccines  Qualifies for Shingles Vaccine? Yes   Zostavax completed Yes   Shingrix Completed?: No.    Education has been provided regarding the importance of this vaccine. Patient has been advised to call insurance company to determine out of pocket expense if they have not yet received this vaccine. Advised may also receive vaccine at local pharmacy or Health Dept. Verbalized acceptance and understanding.  Screening Tests Health Maintenance  Topic Date Due   Zoster Vaccines- Shingrix (  1 of 2) 04/26/1967   Fecal DNA (Cologuard)  Never done   OPHTHALMOLOGY EXAM  07/30/2020   DTaP/Tdap/Td (2 - Td or Tdap) 08/04/2020   INFLUENZA VACCINE  04/19/2023   HEMOGLOBIN A1C  10/04/2023   Diabetic kidney evaluation - Urine ACR  01/02/2024   FOOT EXAM  01/02/2024   Diabetic kidney evaluation - eGFR measurement  04/11/2024   Medicare Annual Wellness (AWV)  06/06/2024   Pneumonia Vaccine 66+ Years old  Completed   DEXA SCAN  Completed   Hepatitis C Screening  Completed   HPV VACCINES  Aged Out   Colonoscopy  Discontinued   COVID-19 Vaccine  Discontinued    Health Maintenance  Health Maintenance Due  Topic Date Due   Zoster Vaccines- Shingrix (1 of 2) 04/26/1967   Fecal DNA (Cologuard)  Never done   OPHTHALMOLOGY EXAM   07/30/2020   DTaP/Tdap/Td (2 - Td or Tdap) 08/04/2020   INFLUENZA VACCINE  04/19/2023    Declined referral for colonoscopy  Mammogram status: No longer required due to age.  Declined referral for BDS  Lung Cancer Screening: (Low Dose CT Chest recommended if Age 56-80 years, 20 pack-year currently smoking OR have quit w/in 15years.) does not qualify.    Additional Screening:  Hepatitis C Screening: does qualify; Completed 02/13/13  Vision Screening: Recommended annual ophthalmology exams for early detection of glaucoma and other disorders of the eye. Is the patient up to date with their annual eye exam?  No  Who is the provider or what is the name of the office in which the patient attends annual eye exams? No one If pt is not established with a provider, would they like to be referred to a provider to establish care? No .   Dental Screening: Recommended annual dental exams for proper oral hygiene   Community Resource Referral / Chronic Care Management: CRR required this visit?  No   CCM required this visit?  No     Plan:     I have personally reviewed and noted the following in the patient's chart:   Medical and social history Use of alcohol, tobacco or illicit drugs  Current medications and supplements including opioid prescriptions. Patient is not currently taking opioid prescriptions. Functional ability and status Nutritional status Physical activity Advanced directives List of other physicians Hospitalizations, surgeries, and ER visits in previous 12 months Vitals Screenings to include cognitive, depression, and falls Referrals and appointments  In addition, I have reviewed and discussed with patient certain preventive protocols, quality metrics, and best practice recommendations. A written personalized care plan for preventive services as well as general preventive health recommendations were provided to patient.     Hal Hope, LPN   1/61/0960    After Visit Summary: (MyChart) Due to this being a telephonic visit, the after visit summary with patients personalized plan was offered to patient via MyChart   Nurse Notes: none

## 2023-06-12 DIAGNOSIS — I700 Atherosclerosis of aorta: Secondary | ICD-10-CM | POA: Diagnosis not present

## 2023-06-12 DIAGNOSIS — K219 Gastro-esophageal reflux disease without esophagitis: Secondary | ICD-10-CM | POA: Diagnosis not present

## 2023-06-12 DIAGNOSIS — Z5982 Transportation insecurity: Secondary | ICD-10-CM | POA: Diagnosis not present

## 2023-06-12 DIAGNOSIS — F17210 Nicotine dependence, cigarettes, uncomplicated: Secondary | ICD-10-CM | POA: Diagnosis not present

## 2023-06-12 DIAGNOSIS — H409 Unspecified glaucoma: Secondary | ICD-10-CM | POA: Diagnosis not present

## 2023-06-12 DIAGNOSIS — Z471 Aftercare following joint replacement surgery: Secondary | ICD-10-CM | POA: Diagnosis not present

## 2023-06-12 DIAGNOSIS — Z556 Problems related to health literacy: Secondary | ICD-10-CM | POA: Diagnosis not present

## 2023-06-12 DIAGNOSIS — H25011 Cortical age-related cataract, right eye: Secondary | ICD-10-CM | POA: Diagnosis not present

## 2023-06-12 DIAGNOSIS — M4802 Spinal stenosis, cervical region: Secondary | ICD-10-CM | POA: Diagnosis not present

## 2023-06-12 DIAGNOSIS — G4700 Insomnia, unspecified: Secondary | ICD-10-CM | POA: Diagnosis not present

## 2023-06-12 DIAGNOSIS — Z7951 Long term (current) use of inhaled steroids: Secondary | ICD-10-CM | POA: Diagnosis not present

## 2023-06-12 DIAGNOSIS — I951 Orthostatic hypotension: Secondary | ICD-10-CM | POA: Diagnosis not present

## 2023-06-12 DIAGNOSIS — E785 Hyperlipidemia, unspecified: Secondary | ICD-10-CM | POA: Diagnosis not present

## 2023-06-12 DIAGNOSIS — I10 Essential (primary) hypertension: Secondary | ICD-10-CM | POA: Diagnosis not present

## 2023-06-12 DIAGNOSIS — E1140 Type 2 diabetes mellitus with diabetic neuropathy, unspecified: Secondary | ICD-10-CM | POA: Diagnosis not present

## 2023-06-12 DIAGNOSIS — Z96611 Presence of right artificial shoulder joint: Secondary | ICD-10-CM | POA: Diagnosis not present

## 2023-06-12 DIAGNOSIS — I69344 Monoplegia of lower limb following cerebral infarction affecting left non-dominant side: Secondary | ICD-10-CM | POA: Diagnosis not present

## 2023-06-12 DIAGNOSIS — J4489 Other specified chronic obstructive pulmonary disease: Secondary | ICD-10-CM | POA: Diagnosis not present

## 2023-06-12 DIAGNOSIS — H25012 Cortical age-related cataract, left eye: Secondary | ICD-10-CM | POA: Diagnosis not present

## 2023-06-12 DIAGNOSIS — R000 Tachycardia, unspecified: Secondary | ICD-10-CM | POA: Diagnosis not present

## 2023-06-12 DIAGNOSIS — M8580 Other specified disorders of bone density and structure, unspecified site: Secondary | ICD-10-CM | POA: Diagnosis not present

## 2023-06-12 DIAGNOSIS — M069 Rheumatoid arthritis, unspecified: Secondary | ICD-10-CM | POA: Diagnosis not present

## 2023-06-12 DIAGNOSIS — D649 Anemia, unspecified: Secondary | ICD-10-CM | POA: Diagnosis not present

## 2023-06-13 DIAGNOSIS — J4489 Other specified chronic obstructive pulmonary disease: Secondary | ICD-10-CM | POA: Diagnosis not present

## 2023-06-13 DIAGNOSIS — D649 Anemia, unspecified: Secondary | ICD-10-CM | POA: Diagnosis not present

## 2023-06-13 DIAGNOSIS — Z556 Problems related to health literacy: Secondary | ICD-10-CM | POA: Diagnosis not present

## 2023-06-13 DIAGNOSIS — I69344 Monoplegia of lower limb following cerebral infarction affecting left non-dominant side: Secondary | ICD-10-CM | POA: Diagnosis not present

## 2023-06-13 DIAGNOSIS — M8580 Other specified disorders of bone density and structure, unspecified site: Secondary | ICD-10-CM | POA: Diagnosis not present

## 2023-06-13 DIAGNOSIS — H25012 Cortical age-related cataract, left eye: Secondary | ICD-10-CM | POA: Diagnosis not present

## 2023-06-13 DIAGNOSIS — F17210 Nicotine dependence, cigarettes, uncomplicated: Secondary | ICD-10-CM | POA: Diagnosis not present

## 2023-06-13 DIAGNOSIS — K219 Gastro-esophageal reflux disease without esophagitis: Secondary | ICD-10-CM | POA: Diagnosis not present

## 2023-06-13 DIAGNOSIS — R000 Tachycardia, unspecified: Secondary | ICD-10-CM | POA: Diagnosis not present

## 2023-06-13 DIAGNOSIS — H25011 Cortical age-related cataract, right eye: Secondary | ICD-10-CM | POA: Diagnosis not present

## 2023-06-13 DIAGNOSIS — Z471 Aftercare following joint replacement surgery: Secondary | ICD-10-CM | POA: Diagnosis not present

## 2023-06-13 DIAGNOSIS — E1140 Type 2 diabetes mellitus with diabetic neuropathy, unspecified: Secondary | ICD-10-CM | POA: Diagnosis not present

## 2023-06-13 DIAGNOSIS — G4700 Insomnia, unspecified: Secondary | ICD-10-CM | POA: Diagnosis not present

## 2023-06-13 DIAGNOSIS — M4802 Spinal stenosis, cervical region: Secondary | ICD-10-CM | POA: Diagnosis not present

## 2023-06-13 DIAGNOSIS — Z5982 Transportation insecurity: Secondary | ICD-10-CM | POA: Diagnosis not present

## 2023-06-13 DIAGNOSIS — I10 Essential (primary) hypertension: Secondary | ICD-10-CM | POA: Diagnosis not present

## 2023-06-13 DIAGNOSIS — M069 Rheumatoid arthritis, unspecified: Secondary | ICD-10-CM | POA: Diagnosis not present

## 2023-06-13 DIAGNOSIS — I951 Orthostatic hypotension: Secondary | ICD-10-CM | POA: Diagnosis not present

## 2023-06-13 DIAGNOSIS — Z96611 Presence of right artificial shoulder joint: Secondary | ICD-10-CM | POA: Diagnosis not present

## 2023-06-13 DIAGNOSIS — I700 Atherosclerosis of aorta: Secondary | ICD-10-CM | POA: Diagnosis not present

## 2023-06-13 DIAGNOSIS — Z7951 Long term (current) use of inhaled steroids: Secondary | ICD-10-CM | POA: Diagnosis not present

## 2023-06-13 DIAGNOSIS — H409 Unspecified glaucoma: Secondary | ICD-10-CM | POA: Diagnosis not present

## 2023-06-13 DIAGNOSIS — E785 Hyperlipidemia, unspecified: Secondary | ICD-10-CM | POA: Diagnosis not present

## 2023-06-18 DIAGNOSIS — Z471 Aftercare following joint replacement surgery: Secondary | ICD-10-CM | POA: Diagnosis not present

## 2023-06-18 DIAGNOSIS — Z556 Problems related to health literacy: Secondary | ICD-10-CM | POA: Diagnosis not present

## 2023-06-18 DIAGNOSIS — M8580 Other specified disorders of bone density and structure, unspecified site: Secondary | ICD-10-CM | POA: Diagnosis not present

## 2023-06-18 DIAGNOSIS — Z7951 Long term (current) use of inhaled steroids: Secondary | ICD-10-CM | POA: Diagnosis not present

## 2023-06-18 DIAGNOSIS — H25012 Cortical age-related cataract, left eye: Secondary | ICD-10-CM | POA: Diagnosis not present

## 2023-06-18 DIAGNOSIS — I69344 Monoplegia of lower limb following cerebral infarction affecting left non-dominant side: Secondary | ICD-10-CM | POA: Diagnosis not present

## 2023-06-18 DIAGNOSIS — H25011 Cortical age-related cataract, right eye: Secondary | ICD-10-CM | POA: Diagnosis not present

## 2023-06-18 DIAGNOSIS — H409 Unspecified glaucoma: Secondary | ICD-10-CM | POA: Diagnosis not present

## 2023-06-18 DIAGNOSIS — E785 Hyperlipidemia, unspecified: Secondary | ICD-10-CM | POA: Diagnosis not present

## 2023-06-18 DIAGNOSIS — G4700 Insomnia, unspecified: Secondary | ICD-10-CM | POA: Diagnosis not present

## 2023-06-18 DIAGNOSIS — I700 Atherosclerosis of aorta: Secondary | ICD-10-CM | POA: Diagnosis not present

## 2023-06-18 DIAGNOSIS — K219 Gastro-esophageal reflux disease without esophagitis: Secondary | ICD-10-CM | POA: Diagnosis not present

## 2023-06-18 DIAGNOSIS — E1140 Type 2 diabetes mellitus with diabetic neuropathy, unspecified: Secondary | ICD-10-CM | POA: Diagnosis not present

## 2023-06-18 DIAGNOSIS — I951 Orthostatic hypotension: Secondary | ICD-10-CM | POA: Diagnosis not present

## 2023-06-18 DIAGNOSIS — R000 Tachycardia, unspecified: Secondary | ICD-10-CM | POA: Diagnosis not present

## 2023-06-18 DIAGNOSIS — M069 Rheumatoid arthritis, unspecified: Secondary | ICD-10-CM | POA: Diagnosis not present

## 2023-06-18 DIAGNOSIS — D649 Anemia, unspecified: Secondary | ICD-10-CM | POA: Diagnosis not present

## 2023-06-18 DIAGNOSIS — I10 Essential (primary) hypertension: Secondary | ICD-10-CM | POA: Diagnosis not present

## 2023-06-18 DIAGNOSIS — F17210 Nicotine dependence, cigarettes, uncomplicated: Secondary | ICD-10-CM | POA: Diagnosis not present

## 2023-06-18 DIAGNOSIS — M4802 Spinal stenosis, cervical region: Secondary | ICD-10-CM | POA: Diagnosis not present

## 2023-06-18 DIAGNOSIS — J4489 Other specified chronic obstructive pulmonary disease: Secondary | ICD-10-CM | POA: Diagnosis not present

## 2023-06-18 DIAGNOSIS — Z96611 Presence of right artificial shoulder joint: Secondary | ICD-10-CM | POA: Diagnosis not present

## 2023-06-18 DIAGNOSIS — Z5982 Transportation insecurity: Secondary | ICD-10-CM | POA: Diagnosis not present

## 2023-06-20 ENCOUNTER — Other Ambulatory Visit: Payer: Self-pay | Admitting: Internal Medicine

## 2023-06-20 DIAGNOSIS — Z556 Problems related to health literacy: Secondary | ICD-10-CM | POA: Diagnosis not present

## 2023-06-20 DIAGNOSIS — J4489 Other specified chronic obstructive pulmonary disease: Secondary | ICD-10-CM | POA: Diagnosis not present

## 2023-06-20 DIAGNOSIS — M4802 Spinal stenosis, cervical region: Secondary | ICD-10-CM | POA: Diagnosis not present

## 2023-06-20 DIAGNOSIS — I10 Essential (primary) hypertension: Secondary | ICD-10-CM | POA: Diagnosis not present

## 2023-06-20 DIAGNOSIS — Z471 Aftercare following joint replacement surgery: Secondary | ICD-10-CM | POA: Diagnosis not present

## 2023-06-20 DIAGNOSIS — H25012 Cortical age-related cataract, left eye: Secondary | ICD-10-CM | POA: Diagnosis not present

## 2023-06-20 DIAGNOSIS — H409 Unspecified glaucoma: Secondary | ICD-10-CM | POA: Diagnosis not present

## 2023-06-20 DIAGNOSIS — M069 Rheumatoid arthritis, unspecified: Secondary | ICD-10-CM | POA: Diagnosis not present

## 2023-06-20 DIAGNOSIS — I951 Orthostatic hypotension: Secondary | ICD-10-CM | POA: Diagnosis not present

## 2023-06-20 DIAGNOSIS — I69344 Monoplegia of lower limb following cerebral infarction affecting left non-dominant side: Secondary | ICD-10-CM | POA: Diagnosis not present

## 2023-06-20 DIAGNOSIS — R000 Tachycardia, unspecified: Secondary | ICD-10-CM | POA: Diagnosis not present

## 2023-06-20 DIAGNOSIS — I700 Atherosclerosis of aorta: Secondary | ICD-10-CM | POA: Diagnosis not present

## 2023-06-20 DIAGNOSIS — Z96611 Presence of right artificial shoulder joint: Secondary | ICD-10-CM | POA: Diagnosis not present

## 2023-06-20 DIAGNOSIS — H25011 Cortical age-related cataract, right eye: Secondary | ICD-10-CM | POA: Diagnosis not present

## 2023-06-20 DIAGNOSIS — Z5982 Transportation insecurity: Secondary | ICD-10-CM | POA: Diagnosis not present

## 2023-06-20 DIAGNOSIS — Z7951 Long term (current) use of inhaled steroids: Secondary | ICD-10-CM | POA: Diagnosis not present

## 2023-06-20 DIAGNOSIS — G4700 Insomnia, unspecified: Secondary | ICD-10-CM | POA: Diagnosis not present

## 2023-06-20 DIAGNOSIS — K219 Gastro-esophageal reflux disease without esophagitis: Secondary | ICD-10-CM | POA: Diagnosis not present

## 2023-06-20 DIAGNOSIS — D649 Anemia, unspecified: Secondary | ICD-10-CM | POA: Diagnosis not present

## 2023-06-20 DIAGNOSIS — M8580 Other specified disorders of bone density and structure, unspecified site: Secondary | ICD-10-CM | POA: Diagnosis not present

## 2023-06-20 DIAGNOSIS — F17210 Nicotine dependence, cigarettes, uncomplicated: Secondary | ICD-10-CM | POA: Diagnosis not present

## 2023-06-20 DIAGNOSIS — E785 Hyperlipidemia, unspecified: Secondary | ICD-10-CM | POA: Diagnosis not present

## 2023-06-20 DIAGNOSIS — E1140 Type 2 diabetes mellitus with diabetic neuropathy, unspecified: Secondary | ICD-10-CM | POA: Diagnosis not present

## 2023-06-20 DIAGNOSIS — J449 Chronic obstructive pulmonary disease, unspecified: Secondary | ICD-10-CM

## 2023-06-21 NOTE — Telephone Encounter (Signed)
Requested Prescriptions  Pending Prescriptions Disp Refills   albuterol (VENTOLIN HFA) 108 (90 Base) MCG/ACT inhaler [Pharmacy Med Name: ALBUTEROL HFA *PROA* 108 (90 BAS Aerosol] 8.5 g 5    Sig: INHALE 1-2 PUFFS INTO THE LUNGS EVERY 6 HOURS AS NEEDED     Pulmonology:  Beta Agonists 2 Failed - 06/20/2023  7:12 PM      Failed - Last BP in normal range    BP Readings from Last 1 Encounters:  04/12/23 (!) 123/92         Passed - Last Heart Rate in normal range    Pulse Readings from Last 1 Encounters:  04/12/23 100         Passed - Valid encounter within last 12 months    Recent Outpatient Visits           2 months ago Encounter for general adult medical examination with abnormal findings   [facility] [place], [name], NP   4 months ago Chronic right shoulder pain   [place] [facility] [place], [place] W, NP   5 months ago Controlled type 2 diabetes mellitus with complication, without long-term current use of insulin [facility])   [place] [facility], [name], NP   1 year ago Nonspecific chest pain   [place] [facility], [name], NP   1 year ago Chest pain, unspecified type   [facility] [place], [name], NP       Future Appointments             In 3 months [name], [name], NP [place] [facility], [facility]

## 2023-06-28 ENCOUNTER — Other Ambulatory Visit: Payer: 59 | Admitting: Urology

## 2023-07-09 DIAGNOSIS — M25511 Pain in right shoulder: Secondary | ICD-10-CM | POA: Diagnosis not present

## 2023-07-09 DIAGNOSIS — M6281 Muscle weakness (generalized): Secondary | ICD-10-CM | POA: Diagnosis not present

## 2023-07-09 DIAGNOSIS — G8929 Other chronic pain: Secondary | ICD-10-CM | POA: Diagnosis not present

## 2023-07-09 DIAGNOSIS — M25611 Stiffness of right shoulder, not elsewhere classified: Secondary | ICD-10-CM | POA: Diagnosis not present

## 2023-07-09 DIAGNOSIS — Z96611 Presence of right artificial shoulder joint: Secondary | ICD-10-CM | POA: Diagnosis not present

## 2023-07-17 DIAGNOSIS — Z96611 Presence of right artificial shoulder joint: Secondary | ICD-10-CM | POA: Diagnosis not present

## 2023-07-22 ENCOUNTER — Other Ambulatory Visit: Payer: Self-pay | Admitting: Internal Medicine

## 2023-07-22 DIAGNOSIS — G4700 Insomnia, unspecified: Secondary | ICD-10-CM

## 2023-07-23 DIAGNOSIS — M47812 Spondylosis without myelopathy or radiculopathy, cervical region: Secondary | ICD-10-CM | POA: Diagnosis not present

## 2023-07-23 DIAGNOSIS — Z96611 Presence of right artificial shoulder joint: Secondary | ICD-10-CM | POA: Diagnosis not present

## 2023-07-23 DIAGNOSIS — M5412 Radiculopathy, cervical region: Secondary | ICD-10-CM | POA: Diagnosis not present

## 2023-07-23 DIAGNOSIS — M4802 Spinal stenosis, cervical region: Secondary | ICD-10-CM | POA: Diagnosis not present

## 2023-07-23 DIAGNOSIS — M19011 Primary osteoarthritis, right shoulder: Secondary | ICD-10-CM | POA: Diagnosis not present

## 2023-07-23 DIAGNOSIS — M7581 Other shoulder lesions, right shoulder: Secondary | ICD-10-CM | POA: Diagnosis not present

## 2023-07-24 NOTE — Telephone Encounter (Signed)
Requested Prescriptions  Pending Prescriptions Disp Refills   traZODone (DESYREL) 50 MG tablet [Pharmacy Med Name: TRAZODONE 50 MG TABS 50 Tablet] 90 tablet 0    Sig: TAKE 1 TABLET BY MOUTH EVERY NIGHT AT BEDTIME     Psychiatry: Antidepressants - Serotonin Modulator Passed - 07/22/2023  8:30 PM      Passed - Completed PHQ-2 or PHQ-9 in the last 360 days      Passed - Valid encounter within last 6 months    Recent Outpatient Visits           3 months ago Encounter for general adult medical examination with abnormal findings   [place] [facility] [place], [name], NP   6 months ago Chronic right shoulder pain   [place] [name] [place], [place] W, NP   6 months ago Controlled type 2 diabetes mellitus with complication, without long-term current use of insulin [facility])   [place] [facility] [place], [name], NP   1 year ago Nonspecific chest pain   [place] [name] [place], [name], NP   1 year ago Chest pain, unspecified type   [name] [place], [name], NP       Future Appointments             In 2 months [name], [name], NP [facility], [facility]

## 2023-07-25 ENCOUNTER — Other Ambulatory Visit: Payer: Self-pay | Admitting: Student

## 2023-07-25 ENCOUNTER — Other Ambulatory Visit: Payer: 59 | Admitting: Urology

## 2023-07-25 DIAGNOSIS — M4302 Spondylolysis, cervical region: Secondary | ICD-10-CM

## 2023-07-25 DIAGNOSIS — M5412 Radiculopathy, cervical region: Secondary | ICD-10-CM

## 2023-07-25 DIAGNOSIS — M4802 Spinal stenosis, cervical region: Secondary | ICD-10-CM

## 2023-07-26 DIAGNOSIS — M25611 Stiffness of right shoulder, not elsewhere classified: Secondary | ICD-10-CM | POA: Diagnosis not present

## 2023-07-26 DIAGNOSIS — Z96611 Presence of right artificial shoulder joint: Secondary | ICD-10-CM | POA: Diagnosis not present

## 2023-08-21 ENCOUNTER — Other Ambulatory Visit: Payer: Self-pay | Admitting: Internal Medicine

## 2023-08-21 ENCOUNTER — Other Ambulatory Visit: Payer: 59

## 2023-08-23 ENCOUNTER — Ambulatory Visit
Admission: RE | Admit: 2023-08-23 | Discharge: 2023-08-23 | Disposition: A | Discharge status: Home / Self Care | Payer: 59 | Source: Ambulatory Visit | Attending: Student | Admitting: Student

## 2023-08-23 DIAGNOSIS — M4802 Spinal stenosis, cervical region: Secondary | ICD-10-CM | POA: Diagnosis not present

## 2023-08-23 DIAGNOSIS — M47812 Spondylosis without myelopathy or radiculopathy, cervical region: Secondary | ICD-10-CM | POA: Diagnosis not present

## 2023-08-23 DIAGNOSIS — M9971 Connective tissue and disc stenosis of intervertebral foramina of cervical region: Secondary | ICD-10-CM | POA: Diagnosis not present

## 2023-08-23 DIAGNOSIS — M4302 Spondylolysis, cervical region: Secondary | ICD-10-CM

## 2023-08-23 DIAGNOSIS — M5412 Radiculopathy, cervical region: Secondary | ICD-10-CM

## 2023-08-23 NOTE — Telephone Encounter (Signed)
Requested Prescriptions  Pending Prescriptions Disp Refills   buPROPion (WELLBUTRIN XL) 150 MG 24 hr tablet [Pharmacy Med Name: BUPROPION XL 150MG  TAB 150 Tablet] 10 tablet 10    Sig: TAKE 1 TABLET BY MOUTH ONCE DAILY *NEED TO KEEP UPCOMING APPOINTMENT*     Psychiatry: Antidepressants - bupropion Failed - 08/21/2023  8:00 PM      Failed - Last BP in normal range    BP Readings from Last 1 Encounters:  04/12/23 (!) 123/92         Passed - Cr in normal range and within 360 days    Creat  Date Value Ref Range Status  04/03/2023 0.90 0.60 - 1.00 mg/dL Final   Creatinine, Ser  Date Value Ref Range Status  04/12/2023 0.82 0.44 - 1.00 mg/dL Final   Creatinine, Urine  Date Value Ref Range Status  01/02/2023 87 20 - 275 mg/dL Final         Passed - AST in normal range and within 360 days    AST  Date Value Ref Range Status  04/03/2023 14 10 - 35 U/L Final   SGOT(AST)  Date Value Ref Range Status  09/01/2014 20 15 - 37 Unit/L Final         Passed - ALT in normal range and within 360 days    ALT  Date Value Ref Range Status  04/03/2023 14 6 - 29 U/L Final   SGPT (ALT)  Date Value Ref Range Status  09/01/2014 24 U/L Final    Comment:    14-63 NOTE: New Reference Range 04/07/14          Passed - Completed PHQ-2 or PHQ-9 in the last 360 days      Passed - Valid encounter within last 6 months    Recent Outpatient Visits           4 months ago Encounter for general adult medical examination with abnormal findings   [place] [name] [place], [name], NP   7 months ago Chronic right shoulder pain   [place] [facility] [place], [name], NP   7 months ago Controlled type 2 diabetes mellitus with complication, without long-term current use of insulin [facility])   [place] [place], [name], NP   1 year ago Nonspecific chest pain   [name] [place], [name], NP   1 year ago Chest  pain, unspecified type   [facility] [place], [name], NP       Future Appointments             In 1 month [place], [name], NP [place] [facility], [facility]

## 2023-09-21 ENCOUNTER — Other Ambulatory Visit: Payer: Self-pay

## 2023-09-21 ENCOUNTER — Inpatient Hospital Stay
Admission: RE | Admit: 2023-09-21 | Discharge: 2023-09-21 | Disposition: A | Discharge status: Home / Self Care | Payer: Self-pay | Source: Ambulatory Visit | Attending: Physician Assistant | Admitting: Physician Assistant

## 2023-09-21 DIAGNOSIS — Z049 Encounter for examination and observation for unspecified reason: Secondary | ICD-10-CM

## 2023-09-25 NOTE — Progress Notes (Deleted)
 Referring Physician:  Kip Lynwood Double, PA-C [address] [place],  [place] [address]  Primary Physician:  Kendra [name] ORN, NP  History of Present Illness: 09/25/2023 [name] is here today with a chief complaint of ***   Duration: *** Location: *** Quality: ***aching, throbbing Severity: ***  Precipitating: aggravated by *** Modifying factors: made better by *** Weakness: none Timing: ***constant Bowel/Bladder Dysfunction: none  Conservative measures:  Physical therapy: *** has participated in for her shoulder at [facility], none for her neck? Multimodal medical therapy including regular antiinflammatories: *** tylenol , oxycodone , flexeril   Injections: *** no epidural steroid injections?  Past Surgery: *** Neck Surgery??  Kendra [name] has ***no symptoms of cervical myelopathy.  The symptoms are causing a significant impact on the patient's life.   Review of Systems:  A 10 point review of systems is negative, except for the pertinent positives and negatives detailed in the HPI.  Past Medical History: Past Medical History:  Diagnosis Date   Anemia    Aortic atherosclerosis (HCC)    Asthma    Back pain    Bladder tumor    Cancer (HCC)    Carotid artery stenosis    Cataract    Cervical disc disorder    COPD (chronic obstructive pulmonary disease) (HCC)    Depression    Diabetes (HCC)    type II   Diabetic neuropathy (HCC)    Dyspnea    with exertion   GERD (gastroesophageal reflux disease)    Hematuria    History of kidney stones    per patient can't remember the year   Hyperlipidemia    Hypertension    per patient take meds for it   Insomnia    Neuropathy    Osteoarthritis    Osteopenia    Pneumonia 2018   per patient   Reflux    Rheumatoid arthritis (HCC)    Stroke (HCC) 2015   and again 2017  - Weakness in left leg, staggers w/ walking, vision    Tenosynovitis     Past Surgical History: Past Surgical  History:  Procedure Laterality Date   BLADDER INSTILLATION N/A 06/23/2022   Procedure: BLADDER INSTILLATION OF GEMCITABINE ;  Surgeon: Kendra Redell BROCKS, MD;  Location: [facility] ORS;  Service: Urology;  Laterality: N/A;   BREAST BIOPSY Right    Fatty Tumor   ENDARTERECTOMY Right 10/21/2019   Procedure: ENDARTERECTOMY CAROTID RIGHT;  Surgeon: Kendra Penne Bruckner, MD;  Location: [facility] OR;  Service: Vascular;  Laterality: Right;   ENDARTERECTOMY Left 12/30/2019   Procedure: LEFT CAROTID ENDARTERECTOMY;  Surgeon: Kendra Penne Bruckner, MD;  Location: [facility] OR;  Service: Vascular;  Laterality: Left;   NECK SURGERY     OVARIAN CYST REMOVAL     PATCH ANGIOPLASTY Right 10/21/2019   Procedure: Patch Angioplasty Using XenoSure Biologic Patch Right Carotid;  Surgeon: Kendra Penne Bruckner, MD;  Location: [facility] OR;  Service: Vascular;  Laterality: Right;   PATCH ANGIOPLASTY Left 12/30/2019   Procedure: Patch Angioplasty Left Carotid;  Surgeon: Kendra Penne Bruckner, MD;  Location: [facility] OR;  Service: Vascular;  Laterality: Left;   REVERSE SHOULDER ARTHROPLASTY Right 04/10/2023   Procedure: REVERSE SHOULDER ARTHROPLASTY WITH BICEPS TENODESIS;  Surgeon: Kendra Norleen PARAS, MD;  Location: [facility] ORS;  Service: Orthopedics;  Laterality: Right;  MAKE SECOND CASE OF THE DAY   TRANSURETHRAL RESECTION OF BLADDER TUMOR N/A 06/23/2022   Procedure: TRANSURETHRAL RESECTION OF BLADDER TUMOR (TURBT);  Surgeon: Kendra Redell BROCKS, MD;  Location:  [facility] ORS;  Service: Urology;  Laterality: N/A;    Allergies: Allergies as of 09/26/2023 - Review Complete 06/07/2023  Allergen Reaction Noted   Citalopram  Nausea And Vomiting 04/30/2019    Medications: Outpatient Encounter Medications as of 09/26/2023  Medication Sig   acetaminophen  (TYLENOL  8 HOUR ARTHRITIS PAIN) 650 MG CR tablet Take 1 tablet (650 mg total) by mouth 3 (three) times daily as needed for pain.   albuterol  (VENTOLIN  HFA) 108 (90 Base) MCG/ACT inhaler INHALE 1-2 PUFFS INTO  THE LUNGS EVERY 6 HOURS AS NEEDED   aspirin  EC 81 MG tablet Take 1 tablet (81 mg total) by mouth daily. Swallow whole.   atorvastatin  (LIPITOR) 40 MG tablet TAKE 1 TABLET BY MOUTH BEDTIME   buPROPion  (WELLBUTRIN  XL) 150 MG 24 hr tablet TAKE 1 TABLET BY MOUTH ONCE DAILY *NEED TO KEEP UPCOMING APPOINTMENT*   calcium  carbonate (CALCIUM  600) 600 MG TABS tablet Take 1 tablet (600 mg total) by mouth daily.   Fluticasone -Umeclidin-Vilant (TRELEGY ELLIPTA ) 100-62.5-25 MCG/ACT AEPB Inhale 1 puff into the lungs at bedtime.   oxyCODONE  (ROXICODONE ) 5 MG immediate release tablet Take 1-2 tablets (5-10 mg total) by mouth every 4 (four) hours as needed for moderate pain or severe pain.   silver  sulfADIAZINE  (SILVADENE ) 1 % cream Apply 1 Application topically daily.   traZODone  (DESYREL ) 50 MG tablet TAKE 1 TABLET BY MOUTH EVERY NIGHT AT BEDTIME   No facility-administered encounter medications on file as of 09/26/2023.    Social History: Social History   Tobacco Use   Smoking status: Every Day    Current packs/day: 0.25    Average packs/day: 0.3 packs/day for 61.0 years (15.3 ttl pk-yrs)    Types: Cigarettes    Passive exposure: Current   Smokeless tobacco: Former    Types: Snuff  Vaping Use   Vaping status: Never Used  Substance Use Topics   Alcohol use: No   Drug use: No    Family Medical History: Family History  Problem Relation Age of Onset   Stroke Father    Depression Father    Diabetes Mother    Hyperlipidemia Mother    Breast cancer Mother    Heart murmur Son    Heart murmur Son    Heart murmur Son     Physical Examination: @VITALWITHPAIN @  General: Patient is well developed, well nourished, calm, collected, and in no apparent distress. Attention to examination is appropriate.  Psychiatric: Patient is non-anxious.  Head:  Pupils equal, round, and reactive to light.  ENT:  Oral mucosa appears well hydrated.  Neck:   Supple.  ***Full range of  motion.  Respiratory: Patient is breathing without any difficulty.  Extremities: No edema.  Vascular: Palpable dorsal pedal pulses.  Skin:   On exposed skin, there are no abnormal skin lesions.  NEUROLOGICAL:     Awake, alert, oriented to person, place, and time.  Speech is clear and fluent. Fund of knowledge is appropriate.   Cranial Nerves: Pupils equal round and reactive to light.  Facial tone is symmetric.  Facial sensation is symmetric.  ROM of spine: ***full.  Palpation of spine: ***non tender.    Strength: Side Biceps Triceps Deltoid Interossei Grip Wrist Ext. Wrist Flex.  R 5 5 5 5 5 5 5   L 5 5 5 5 5 5 5    Side Iliopsoas Quads Hamstring PF DF EHL  R 5 5 5 5 5 5   L 5 5 5 5 5 5    Reflexes are ***2+ and  symmetric at the biceps, triceps, brachioradialis, patella and achilles.   Hoffman's is absent.  Clonus is not present.  Toes are down-going.  Bilateral upper and lower extremity sensation is intact to light touch.    Gait is normal.   No difficulty with tandem gait.   No evidence of dysmetria noted.  Medical Decision Making  Imaging: ***  I have personally reviewed the images and agree with the above interpretation.  Assessment and Plan: Ms. Soileau is a pleasant 78 y.o. female with ***    Thank you for involving me in the care of this patient.   I spent a total of *** minutes in both face-to-face and non-face-to-face activities for this visit on the date of this encounter.   Lyle Decamp, PA-C Dept. of Neurosurgery

## 2023-09-25 NOTE — Progress Notes (Signed)
 Referring Physician:  Kip Lynwood Double, PA-C [address] [place],  [place] [address]  Primary Physician:  Antonette Angeline ORN, NP  History of Present Illness: 09/26/2023 Kendra [name] is here today with a chief complaint of right upper extremity numbness and tingling x 5 months.  The patient states that she underwent a right reverse total shoulder arthroplasty on 04/10/23 with interscalene block and upon waking from surgery she had immediate weakness, numbness, and tingling in her right arm.  She states that this was new compared to prior to surgery.  She has been dropping things as a result on her right hand.  Her pain extends from the right side of her neck and shoulder area and extends down into all 5 fingers with a burning pain.  She denies any issues with ambulation or saddle anesthesia.  Patient underwent previous C5-7 ACDF approximately 15 years ago.  The symptoms are causing a significant impact on the patient's life.   Review of Systems:  A 10 point review of systems is negative, except for the pertinent positives and negatives detailed in the HPI.  Past Medical History: Past Medical History:  Diagnosis Date   Anemia    Aortic atherosclerosis (HCC)    Asthma    Back pain    Bladder tumor    Cancer (HCC)    Carotid artery stenosis    Cataract    Cervical disc disorder    COPD (chronic obstructive pulmonary disease) (HCC)    Depression    Diabetes (HCC)    type II   Diabetic neuropathy (HCC)    Dyspnea    with exertion   GERD (gastroesophageal reflux disease)    Hematuria    History of kidney stones    per patient can't remember the year   Hyperlipidemia    Hypertension    per patient take meds for it   Insomnia    Neuropathy    Osteoarthritis    Osteopenia    Pneumonia 2018   per patient   Reflux    Rheumatoid arthritis (HCC)    Stroke (HCC) 2015   and again 2017  - Weakness in left leg, staggers w/ walking, vision    Tenosynovitis      Past Surgical History: Past Surgical History:  Procedure Laterality Date   BLADDER INSTILLATION N/A 06/23/2022   Procedure: BLADDER INSTILLATION OF GEMCITABINE ;  Surgeon: Francisca Redell BROCKS, MD;  Location: [facility] ORS;  Service: Urology;  Laterality: N/A;   BREAST BIOPSY Right    Fatty Tumor   ENDARTERECTOMY Right 10/21/2019   Procedure: ENDARTERECTOMY CAROTID RIGHT;  Surgeon: Sheree Penne Bruckner, MD;  Location: [facility] OR;  Service: Vascular;  Laterality: Right;   ENDARTERECTOMY Left 12/30/2019   Procedure: LEFT CAROTID ENDARTERECTOMY;  Surgeon: Sheree Penne Bruckner, MD;  Location: [facility] OR;  Service: Vascular;  Laterality: Left;   NECK SURGERY     OVARIAN CYST REMOVAL     PATCH ANGIOPLASTY Right 10/21/2019   Procedure: Patch Angioplasty Using XenoSure Biologic Patch Right Carotid;  Surgeon: Sheree Penne Bruckner, MD;  Location: [facility] OR;  Service: Vascular;  Laterality: Right;   PATCH ANGIOPLASTY Left 12/30/2019   Procedure: Patch Angioplasty Left Carotid;  Surgeon: Sheree Penne Bruckner, MD;  Location: [name] OR;  Service: Vascular;  Laterality: Left;   REVERSE SHOULDER ARTHROPLASTY Right 04/10/2023   Procedure: REVERSE SHOULDER ARTHROPLASTY WITH BICEPS TENODESIS;  Surgeon: Edie Norleen PARAS, MD;  Location: [facility] ORS;  Service: Orthopedics;  Laterality: Right;  MAKE SECOND  CASE OF THE DAY   TRANSURETHRAL RESECTION OF BLADDER TUMOR N/A 06/23/2022   Procedure: TRANSURETHRAL RESECTION OF BLADDER TUMOR (TURBT);  Surgeon: Francisca Redell BROCKS, MD;  Location: [facility] ORS;  Service: Urology;  Laterality: N/A;    Allergies: Allergies as of 09/26/2023 - Review Complete 09/26/2023  Allergen Reaction Noted   Citalopram  Nausea And Vomiting 04/30/2019    Medications: Outpatient Encounter Medications as of 09/26/2023  Medication Sig   acetaminophen  (TYLENOL  8 HOUR ARTHRITIS PAIN) 650 MG CR tablet Take 1 tablet (650 mg total) by mouth 3 (three) times daily as needed for pain.   albuterol  (VENTOLIN  HFA) 108 (90  Base) MCG/ACT inhaler INHALE 1-2 PUFFS INTO THE LUNGS EVERY 6 HOURS AS NEEDED   aspirin  EC 81 MG tablet Take 1 tablet (81 mg total) by mouth daily. Swallow whole.   Fluticasone -Umeclidin-Vilant (TRELEGY ELLIPTA ) 100-62.5-25 MCG/ACT AEPB Inhale 1 puff into the lungs at bedtime.   gabapentin  (NEURONTIN ) 300 MG capsule Take 1 capsule (300 mg total) by mouth 3 (three) times daily.   oxyCODONE  (ROXICODONE ) 5 MG immediate release tablet Take 1-2 tablets (5-10 mg total) by mouth every 4 (four) hours as needed for moderate pain or severe pain.   [DISCONTINUED] atorvastatin  (LIPITOR) 40 MG tablet TAKE 1 TABLET BY MOUTH BEDTIME   [DISCONTINUED] buPROPion  (WELLBUTRIN  XL) 150 MG 24 hr tablet TAKE 1 TABLET BY MOUTH ONCE DAILY *NEED TO KEEP UPCOMING APPOINTMENT*   [DISCONTINUED] calcium  carbonate (CALCIUM  600) 600 MG TABS tablet Take 1 tablet (600 mg total) by mouth daily.   [DISCONTINUED] gabapentin  (NEURONTIN ) 100 MG capsule Take by mouth.   [DISCONTINUED] silver  sulfADIAZINE  (SILVADENE ) 1 % cream Apply 1 Application topically daily.   [DISCONTINUED] traZODone  (DESYREL ) 50 MG tablet TAKE 1 TABLET BY MOUTH EVERY NIGHT AT BEDTIME   No facility-administered encounter medications on file as of 09/26/2023.    Social History: Social History   Tobacco Use   Smoking status: Every Day    Current packs/day: 0.25    Average packs/day: 0.3 packs/day for 61.0 years (15.3 ttl pk-yrs)    Types: Cigarettes    Passive exposure: Current   Smokeless tobacco: Former    Types: Snuff  Vaping Use   Vaping status: Never Used  Substance Use Topics   Alcohol use: No   Drug use: No    Family Medical History: Family History  Problem Relation Age of Onset   Stroke Father    Depression Father    Diabetes Mother    Hyperlipidemia Mother    Breast cancer Mother    Heart murmur Son    Heart murmur Son    Heart murmur Son     Physical Examination:   General: Patient is well developed, well nourished, calm,  collected, and in no apparent distress. Attention to examination is appropriate.  Psychiatric: Patient is non-anxious.  Head:  Pupils equal, round, and reactive to light.  ENT:  Oral mucosa appears well hydrated.  Neck:   Supple.  Decreased range of motion  Respiratory: Patient is breathing without any difficulty.  Extremities: No edema.  Vascular: Palpable dorsal pedal pulses.  Skin:   On exposed skin, there are no abnormal skin lesions.  NEUROLOGICAL:     Awake, alert, oriented to person, place, and time.  Speech is clear and fluent. Fund of knowledge is appropriate.   Cranial Nerves: Pupils equal round and reactive to light.  Facial tone is symmetric.    Asymmetric muscle bulk in right upper extremity compared to left.  Decreased in the right upper extremity.  Patient with allodynia over deltoid patch and decreased sensation in the remaining portion of her right upper extremity compared to the left.    Exercised some caution with motor exam due to recent shoulder surgery. Golf ball sized fluid collection on RUE olecranon  Strength: Side Biceps Triceps Deltoid Interossei Grip Wrist Ext. Wrist Flex.  R 4+ 4+ 2 4+ 4+ 4+ 4+  L 5 5 5 5 5 5 5     Hyporeflexia in bilateral upper extremities.  Hoffman's is absent.  Patient ambulates with cane.  Medical Decision Making  Imaging: MRI Cervical spine without contrast: IMPRESSION: 1. Postsurgical changes from C5-C7 ACDF. No evidence of hardware complication. 2. Severe spinal canal narrowing at C3-C4 with severe bilateral neural foraminal narrowing, right-greater-than-left. 3. Severe right neural foraminal narrowing at C2-C3. 4. Moderate to severe bilateral neural foraminal narrowing at C4-C5.   I have personally reviewed the images and agree with the above interpretation.  Assessment and Plan: Kendra [name] is a pleasant 78 y.o. female is here today with a chief complaint of right upper extremity numbness and tingling x 5  months.  The patient states that she underwent a right reverse total shoulder arthroplasty on 04/10/23 with interscalene block and upon waking from surgery she had immediate weakness, numbness, and tingling in her right arm.  She states that this was new compared to prior to surgery.  She has been dropping things as a result on her right hand.  Her pain extends from the right side of her neck and shoulder area and extends down into all 5 fingers with a burning pain.  She denies any issues with ambulation or saddle anesthesia.  Patient underwent previous C5-7 ACDF approximately 15 years ago. On examination, Asymmetric muscle bulk in right upper extremity compared to left.  Decreased in the right upper extremity.  Patient with allodynia over deltoid patch and decreased sensation in the remaining portion of her right upper extremity compared to the left. Significant weakness in deltoid, but present throughout RUE. Golf ball sized fluid collection on RUE olecranon.  Hyporeflexia in bilateral upper extremities.  Hoffman's is absent.  MRI shows severe spinal stenosis at C3-4 with severe bilateral neural foraminal narrowing, R>L. Severe right neural foraminal narrowing at C2-3.   It was a pleasure to the patient in clinic today.  Although patient does have severe spinal canal narrowing, patient's pain, numbness, tingling, and weakness per the patient all began following waking up from her reverse right total shoulder arthroplasty.  Concern for stretch versus block injury from interscalene block. Cervical radiculopathy and peripheral neuropathies also on the differential.  Would like her to undergo EMG as soon as possible to evaluate for this.  Once EMG is complete we will review.  In the future, would consider CT of her cervical spine for further evaluation due to some artifact on previous MRI due to previous ACDF cages.  Thank you for involving me in the care of this patient.   Lyle Decamp, PA-C Dept. of  Neurosurgery

## 2023-09-26 ENCOUNTER — Encounter: Payer: Self-pay | Admitting: Physician Assistant

## 2023-09-26 ENCOUNTER — Ambulatory Visit: Payer: 59 | Admitting: Physician Assistant

## 2023-09-26 VITALS — BP 136/88 | Ht 65.0 in | Wt 127.0 lb

## 2023-09-26 DIAGNOSIS — R29898 Other symptoms and signs involving the musculoskeletal system: Secondary | ICD-10-CM | POA: Diagnosis not present

## 2023-09-26 DIAGNOSIS — M4802 Spinal stenosis, cervical region: Secondary | ICD-10-CM | POA: Diagnosis not present

## 2023-09-26 DIAGNOSIS — R269 Unspecified abnormalities of gait and mobility: Secondary | ICD-10-CM

## 2023-09-26 DIAGNOSIS — Z96611 Presence of right artificial shoulder joint: Secondary | ICD-10-CM

## 2023-09-26 DIAGNOSIS — R202 Paresthesia of skin: Secondary | ICD-10-CM | POA: Diagnosis not present

## 2023-09-26 DIAGNOSIS — R200 Anesthesia of skin: Secondary | ICD-10-CM | POA: Diagnosis not present

## 2023-09-26 MED ORDER — GABAPENTIN 300 MG PO CAPS
300.0000 mg | ORAL_CAPSULE | Freq: Three times a day (TID) | ORAL | 1 refills | Status: DC
Start: 2023-09-26 — End: 2023-11-12

## 2023-10-01 ENCOUNTER — Other Ambulatory Visit: Payer: Self-pay

## 2023-10-01 ENCOUNTER — Encounter: Payer: Self-pay | Admitting: Neurology

## 2023-10-01 DIAGNOSIS — R202 Paresthesia of skin: Secondary | ICD-10-CM

## 2023-10-04 ENCOUNTER — Ambulatory Visit: Payer: 59 | Admitting: Internal Medicine

## 2023-10-12 NOTE — Telephone Encounter (Signed)
RX printed and signed, placed up front for pickup

## 2023-10-12 NOTE — Telephone Encounter (Signed)
Copied from CRM [id number]. Topic: General - Other >> Oct 12, 2023 10:47 AM Turkey B wrote: Reason for CRM: pt came in states she needs a new walker. She states one of the wheels came off

## 2023-10-19 ENCOUNTER — Other Ambulatory Visit: Payer: Self-pay | Admitting: Internal Medicine

## 2023-10-19 DIAGNOSIS — G4700 Insomnia, unspecified: Secondary | ICD-10-CM

## 2023-10-19 DIAGNOSIS — E785 Hyperlipidemia, unspecified: Secondary | ICD-10-CM

## 2023-10-22 NOTE — Telephone Encounter (Signed)
Refused Atorvastatin 40 mg and Trazodone 50 mg because they were discontinued 09/26/2023.

## 2023-10-23 ENCOUNTER — Other Ambulatory Visit: Payer: Self-pay

## 2023-10-23 ENCOUNTER — Ambulatory Visit: Payer: 59 | Admitting: Neurology

## 2023-10-23 DIAGNOSIS — G5601 Carpal tunnel syndrome, right upper limb: Secondary | ICD-10-CM

## 2023-10-23 DIAGNOSIS — M5412 Radiculopathy, cervical region: Secondary | ICD-10-CM

## 2023-10-23 NOTE — Procedures (Signed)
  [facility]  [address] [place], [address]  [place], [place] [address] Tel: [phone] Fax: [phone] Test Date:  10/23/2023  Patient: Kendra [name] DOB: 1946-03-15 Physician: Venetia Potters, MD  Sex: Female Height: 5' 5 Ref Phys: Lyle Decamp, DEVONNA  ID#: [id number]   Technician:    History: This is a 78 year old female with numbness, tingling, and weakness in the right upper limb.  NCV & EMG Findings: Extensive electrodiagnostic evaluation of the right upper limb shows: Right median sensory response is absent. Right ulnar and radial sensory responses are within normal limits. Right median (APB) motor response shows prolonged distal onset latency (4.7 ms) and reduced amplitude (2.9 mV). Right ulnar (ADM) motor response is within normal limits. Chronic motor axon loss changes without accompanying active denervation changes are seen in the right biceps and deltoid muscles. Cervical paraspinal muscles were deferred due to prior cervical spine surgery.  Impression: This is an abnormal study. The findings are most consistent with the following: Evidence of a right median mononeuropathy at or distal to the wrist, consistent with carpal tunnel syndrome, severe in degree electrically. The residuals of an old intraspinal canal lesion (ie: motor radiculopathy) at the right C5 root or segment, mild to moderate in degree electrically. Screening studies for right ulnar or radial mononeuropathies are normal.    ___________________________ Venetia Potters, MD    Nerve Conduction Studies Motor Nerve Results    Latency Amplitude F-Lat Segment Distance CV Comment  Site (ms) Norm (mV) Norm (ms)  (cm) (m/s) Norm   Right Median (APB) Motor  Wrist *4.7  < 4.0 *2.9  > 5.0        Elbow 9.5 - 2.5 -  Elbow-Wrist 27 56  > 50   Right Ulnar (ADM) Motor  Wrist 2.2  < 3.1 10.0  > 7.0        Bel elbow 6.1 - 9.0 -  Bel elbow-Wrist 22 56  > 50   Ab elbow 8.1 - 8.3 -  Ab elbow-Bel elbow 10 50 -     Sensory Sites    Neg Peak Lat Amplitude (O-P) Segment Distance Velocity Comment  Site (ms) Norm (V) Norm  (cm) (ms)   Right Median Sensory  Wrist-Dig II *NR  < 3.8 *NR  > 10 Wrist-Dig II 13    Right Radial Sensory  Forearm-Wrist 2.6  < 2.8 17  > 10 Forearm-Wrist 10    Right Ulnar Sensory  Wrist-Dig V 2.9  < 3.2 11  > 5 Wrist-Dig V 11     Electromyography   Side Muscle Ins.Act Fibs Fasc Recrt Amp Dur Poly Activation Comment  Right FDI Nml Nml Nml Nml Nml Nml Nml Nml N/A  Right EIP Nml Nml Nml Nml Nml Nml Nml Nml N/A  Right Pronator teres Nml Nml Nml Nml Nml Nml Nml Nml N/A  Right Biceps Nml Nml Nml *2- *1+ *1+ *1+ Nml N/A  Right Triceps Nml Nml Nml Nml Nml Nml Nml Nml N/A  Right Deltoid Nml Nml Nml *1- *1+ *1+ *1+ Nml N/A      Waveforms:  Motor      Sensory

## 2023-10-25 NOTE — Progress Notes (Signed)
"   Can we have this patient scheduled to see Dr. Felipe Horton for a return slot to discuss EMG and surgery options?  Brooke "    Tried to contact the patient 3x

## 2023-11-05 NOTE — Progress Notes (Deleted)
 Referring Physician:  No referring provider defined for this encounter.  Primary Physician:  [name], NP  History of Present Illness: 11/05/2023 Kendra [name] is here today with a chief complaint of ***   PreCharting for nerve patients : EMG? -  Imaging of Nerve, Ultrasound or MRI? -  Prior Surgery?-   Intake: Are you Having Pain? If so, where?: *** Are you having weakness? If so, where?: *** Are you having sensory changes? If so where?: *** When did it start?: *** How did it start/happen?: ***  Conservative measures:  Physical therapy: {yes/no:20286} Occupational Therapy: {yes/no:20286} Hand Therapy: {yes/no:20286} Injections: {yes/no:20286} Gabapentin: {yes/no:20286}, Lyrica: {yes/no:20286}, Cymbalta: {yes/no:20286} Past Surgery: {yes/no:20286}  Is this a Second Opinion: {yes/no:20286}  Main Question for Surgeon: ***  Review of Systems:  A 10 point review of systems is negative, except for the pertinent positives and negatives detailed in the HPI.  Past Medical History: Past Medical History:  Diagnosis Date   Anemia    Aortic atherosclerosis (HCC)    Asthma    Back pain    Bladder tumor    Cancer (HCC)    Carotid artery stenosis    Cataract    Cervical disc disorder    COPD (chronic obstructive pulmonary disease) (HCC)    Depression    Diabetes (HCC)    type II   Diabetic neuropathy (HCC)    Dyspnea    with exertion   GERD (gastroesophageal reflux disease)    Hematuria    History of kidney stones    per patient "can't remember the year"   Hyperlipidemia    Hypertension    per patient "take meds for it"   Insomnia    Neuropathy    Osteoarthritis    Osteopenia    Pneumonia 2018   per patient   Reflux    Rheumatoid arthritis (HCC)    Stroke (HCC) 2015   and again 2017  - Weakness in left leg, staggers w/ walking, vision    Tenosynovitis     Past Surgical History: Past Surgical History:  Procedure Laterality Date    BLADDER INSTILLATION N/A 06/23/2022   Procedure: BLADDER INSTILLATION OF GEMCITABINE;  Surgeon: Sondra Come, MD;  Location: [facility] ORS;  Service: Urology;  Laterality: N/A;   BREAST BIOPSY Right    Fatty Tumor   ENDARTERECTOMY Right 10/21/2019   Procedure: ENDARTERECTOMY CAROTID RIGHT;  Surgeon: Maeola Harman, MD;  Location: [facility] OR;  Service: Vascular;  Laterality: Right;   ENDARTERECTOMY Left 12/30/2019   Procedure: LEFT CAROTID ENDARTERECTOMY;  Surgeon: Maeola Harman, MD;  Location: [facility] OR;  Service: Vascular;  Laterality: Left;   NECK SURGERY     OVARIAN CYST REMOVAL     PATCH ANGIOPLASTY Right 10/21/2019   Procedure: Patch Angioplasty Using XenoSure Biologic Patch Right Carotid;  Surgeon: Maeola Harman, MD;  Location: [facility] OR;  Service: Vascular;  Laterality: Right;   PATCH ANGIOPLASTY Left 12/30/2019   Procedure: Patch Angioplasty Left Carotid;  Surgeon: Maeola Harman, MD;  Location: [facility] OR;  Service: Vascular;  Laterality: Left;   REVERSE SHOULDER ARTHROPLASTY Right 04/10/2023   Procedure: REVERSE SHOULDER ARTHROPLASTY WITH BICEPS TENODESIS;  Surgeon: Christena Flake, MD;  Location: [facility] ORS;  Service: Orthopedics;  Laterality: Right;  MAKE SECOND CASE OF THE DAY   TRANSURETHRAL RESECTION OF BLADDER TUMOR N/A 06/23/2022   Procedure: TRANSURETHRAL RESECTION OF BLADDER TUMOR (TURBT);  Surgeon: Sondra Come, MD;  Location: [facility] ORS;  Service:  Urology;  Laterality: N/A;    Allergies: Allergies as of 11/07/2023 - Review Complete 09/26/2023  Allergen Reaction Noted   Citalopram Nausea And Vomiting 04/30/2019    Medications:  Current Outpatient Medications:    acetaminophen (TYLENOL 8 HOUR ARTHRITIS PAIN) 650 MG CR tablet, Take 1 tablet (650 mg total) by mouth 3 (three) times daily as needed for pain., Disp: 90 tablet, Rfl: 1   albuterol (VENTOLIN HFA) 108 (90 Base) MCG/ACT inhaler, INHALE 1-2 PUFFS INTO THE LUNGS EVERY 6 HOURS AS NEEDED, Disp: 8.5  g, Rfl: 5   aspirin EC 81 MG tablet, Take 1 tablet (81 mg total) by mouth daily. Swallow whole., Disp: 90 tablet, Rfl: 1   Fluticasone-Umeclidin-Vilant (TRELEGY ELLIPTA) 100-62.5-25 MCG/ACT AEPB, Inhale 1 puff into the lungs at bedtime., Disp: 60 each, Rfl: 5   gabapentin (NEURONTIN) 300 MG capsule, Take 1 capsule (300 mg total) by mouth 3 (three) times daily., Disp: 90 capsule, Rfl: 1   oxyCODONE (ROXICODONE) 5 MG immediate release tablet, Take 1-2 tablets (5-10 mg total) by mouth every 4 (four) hours as needed for moderate pain or severe pain., Disp: 30 tablet, Rfl: 0  Social History: Social History   Tobacco Use   Smoking status: Every Day    Current packs/day: 0.25    Average packs/day: 0.3 packs/day for 61.0 years (15.3 ttl pk-yrs)    Types: Cigarettes    Passive exposure: Current   Smokeless tobacco: Former    Types: Snuff  Vaping Use   Vaping status: Never Used  Substance Use Topics   Alcohol use: No   Drug use: No    Family Medical History: Family History  Problem Relation Age of Onset   Stroke Father    Depression Father    Diabetes Mother    Hyperlipidemia Mother    Breast cancer Mother    Heart murmur Son    Heart murmur Son    Heart murmur Son     Physical Examination: There were no vitals filed for this visit.  General: Patient is in no apparent distress. Attention to examination is appropriate.  Neck:   Supple.  Full range of motion.  Respiratory: Patient is breathing without any difficulty.   NEUROLOGICAL:     Awake, alert, oriented to person, place, and time.  Speech is clear and fluent.   Cranial Nerves: Pupils equal round and reactive to light.  Facial tone is symmetric. Shoulder shrug is symmetric. Tongue protrusion is midline.  There is no pronator drift.  Motor Exam:  ***  Reflexes are ***2+ and symmetric at the biceps, triceps, brachioradialis, patella and achilles.   Hoffman's is absent. Clonus is Absent  Bilateral upper and lower  extremity sensation is intact to light touch ***.     Gait is normal.  ***   Medical Decision Making  Imaging: ***  Electrodiagnostics: ***  I have personally reviewed the images and electrodiagnostics and agree with the above interpretation.  Assessment and Plan: Kendra [name] is a pleasant 78 y.o. female with ***. The symptoms are causing a significant impact on the patient's life. I have utilized the care everywhere function in epic to review the outside records available from external health systems, and have personally reviewed relevant imaging and electrodiagnostic workup.    Thank you for involving me in the care of this patient.    Lovenia Kim MD/MSCR Neurosurgery - Peripheral Nerve Surgery

## 2023-11-07 ENCOUNTER — Ambulatory Visit: Payer: 59 | Admitting: Neurosurgery

## 2023-11-08 NOTE — Progress Notes (Signed)
 Referring Physician:  No referring provider defined for this encounter.  Primary Physician:  Kendra Munroe, NP  History of Present Illness: 11/12/23 [name] is here today with a chief complaint of right-sided carpal tunnel syndrome.  She has been dealing with this for at least 6 to 7 months.  She has been evaluated for progressive cervical myelopathy versus cervical radiculopathy versus postoperative issues after shoulder injury.  Upon evaluation she was found to have a severe right sided carpal tunnel syndrome.  She states that this wakes her from bed almost every night.  When she has the numbness and tingling in her hands show when she touches anything she has hyperesthesias and it causes her discomfort.  She does get worse when she is using her hand.  Gets worse when she is sleeping.  Review of Systems:  A 10 point review of systems is negative, except for the pertinent positives and negatives detailed in the HPI.  Past Medical History: Past Medical History:  Diagnosis Date   Anemia    Aortic atherosclerosis (HCC)    Asthma    Back pain    Bladder tumor    Cancer (HCC)    Carotid artery stenosis    Cataract    Cervical disc disorder    COPD (chronic obstructive pulmonary disease) (HCC)    Depression    Diabetes (HCC)    type II   Diabetic neuropathy (HCC)    Dyspnea    with exertion   GERD (gastroesophageal reflux disease)    Hematuria    History of kidney stones    per patient "can't remember the year"   Hyperlipidemia    Hypertension    per patient "take meds for it"   Insomnia    Neuropathy    Osteoarthritis    Osteopenia    Pneumonia 2018   per patient   Reflux    Rheumatoid arthritis (HCC)    Stroke (HCC) 2015   and again 2017  - Weakness in left leg, staggers w/ walking, vision    Tenosynovitis     Past Surgical History: Past Surgical History:  Procedure Laterality Date   BLADDER INSTILLATION N/A 06/23/2022   Procedure: BLADDER  INSTILLATION OF GEMCITABINE;  Surgeon: Sondra Come, MD;  Location: [facility] ORS;  Service: Urology;  Laterality: N/A;   BREAST BIOPSY Right    Fatty Tumor   ENDARTERECTOMY Right 10/21/2019   Procedure: ENDARTERECTOMY CAROTID RIGHT;  Surgeon: Maeola Harman, MD;  Location: [facility] OR;  Service: Vascular;  Laterality: Right;   ENDARTERECTOMY Left 12/30/2019   Procedure: LEFT CAROTID ENDARTERECTOMY;  Surgeon: Maeola Harman, MD;  Location: [facility] OR;  Service: Vascular;  Laterality: Left;   NECK SURGERY     OVARIAN CYST REMOVAL     PATCH ANGIOPLASTY Right 10/21/2019   Procedure: Patch Angioplasty Using XenoSure Biologic Patch Right Carotid;  Surgeon: Maeola Harman, MD;  Location: [facility] OR;  Service: Vascular;  Laterality: Right;   PATCH ANGIOPLASTY Left 12/30/2019   Procedure: Patch Angioplasty Left Carotid;  Surgeon: Maeola Harman, MD;  Location: [name] OR;  Service: Vascular;  Laterality: Left;   REVERSE SHOULDER ARTHROPLASTY Right 04/10/2023   Procedure: REVERSE SHOULDER ARTHROPLASTY WITH BICEPS TENODESIS;  Surgeon: Christena Flake, MD;  Location: [facility] ORS;  Service: Orthopedics;  Laterality: Right;  MAKE SECOND CASE OF THE DAY   TRANSURETHRAL RESECTION OF BLADDER TUMOR N/A 06/23/2022   Procedure: TRANSURETHRAL RESECTION OF BLADDER TUMOR (TURBT);  Surgeon: Richardo Hanks,  Laurette Schimke, MD;  Location: [facility] ORS;  Service: Urology;  Laterality: N/A;    Allergies: Allergies as of 11/07/2023 - Review Complete 09/26/2023  Allergen Reaction Noted   Citalopram Nausea And Vomiting 04/30/2019    Medications:  Current Outpatient Medications:    acetaminophen (TYLENOL 8 HOUR ARTHRITIS PAIN) 650 MG CR tablet, Take 1 tablet (650 mg total) by mouth 3 (three) times daily as needed for pain., Disp: 90 tablet, Rfl: 1   albuterol (VENTOLIN HFA) 108 (90 Base) MCG/ACT inhaler, INHALE 1-2 PUFFS INTO THE LUNGS EVERY 6 HOURS AS NEEDED, Disp: 8.5 g, Rfl: 5   aspirin EC 81 MG tablet, Take 1 tablet (81  mg total) by mouth daily. Swallow whole., Disp: 90 tablet, Rfl: 1   Fluticasone-Umeclidin-Vilant (TRELEGY ELLIPTA) 100-62.5-25 MCG/ACT AEPB, Inhale 1 puff into the lungs at bedtime., Disp: 60 each, Rfl: 5   gabapentin (NEURONTIN) 300 MG capsule, Take 1 capsule (300 mg total) by mouth 3 (three) times daily., Disp: 90 capsule, Rfl: 1   oxyCODONE (ROXICODONE) 5 MG immediate release tablet, Take 1-2 tablets (5-10 mg total) by mouth every 4 (four) hours as needed for moderate pain or severe pain., Disp: 30 tablet, Rfl: 0  Social History: Social History   Tobacco Use   Smoking status: Every Day    Current packs/day: 0.25    Average packs/day: 0.3 packs/day for 61.0 years (15.3 ttl pk-yrs)    Types: Cigarettes    Passive exposure: Current   Smokeless tobacco: Former    Types: Snuff  Vaping Use   Vaping status: Never Used  Substance Use Topics   Alcohol use: No   Drug use: No    Family Medical History: Family History  Problem Relation Age of Onset   Stroke Father    Depression Father    Diabetes Mother    Hyperlipidemia Mother    Breast cancer Mother    Heart murmur Son    Heart murmur Son    Heart murmur Son     Physical Examination: There were no vitals filed for this visit.  General: Patient is in no apparent distress. Attention to examination is appropriate.  Neck:   Supple.  Full range of motion.  Respiratory: Patient is breathing without any difficulty.   NEUROLOGICAL:     Awake, alert, oriented to person, place, and time.  Speech is clear and fluent.   Cranial Nerves: Pupils equal round and reactive to light.  Facial tone is symmetric. Shoulder shrug is symmetric. Tongue protrusion is midline.  There is no pronator drift.  Motor Exam:  Thenar wasting on the right, 3-4 out of 5 strength in the hypothenar median musculature  Severe decrease in median sensation, palmar sparing on the right  Carpal compression test is positive, reverse Phalen's  positive.   Medical Decision Making  Electrodiagnostics:  [facility]  [address] [place], [address]  [place], [place] [address] Tel: [phone] Fax: [phone] Test Date:  10/23/2023   Patient: Cordella Nyquist DOB: 04-Jul-1946 Physician: Jacquelyne Balint, MD  Sex: Female Height: 5\' 5"  Ref Phys: Joan Flores, PA-C  ID#: [id number]     Technician:      History: This is a 78 year old female with numbness, tingling, and weakness in the right upper limb.   NCV & EMG Findings: Extensive electrodiagnostic evaluation of the right upper limb shows: Right median sensory response is absent. Right ulnar and radial sensory responses are within normal limits. Right median (APB) motor response shows prolonged  distal onset latency (4.7 ms) and reduced amplitude (2.9 mV). Right ulnar (ADM) motor response is within normal limits. Chronic motor axon loss changes without accompanying active denervation changes are seen in the right biceps and deltoid muscles. Cervical paraspinal muscles were deferred due to prior cervical spine surgery.   Impression: This is an abnormal study. The findings are most consistent with the following: Evidence of a right median mononeuropathy at or distal to the wrist, consistent with carpal tunnel syndrome, severe in degree electrically. The residuals of an old intraspinal canal lesion (ie: motor radiculopathy) at the right C5 root or segment, mild to moderate in degree electrically. Screening studies for right ulnar or radial mononeuropathies are normal.       ___________________________ Jacquelyne Balint, MD    I have personally reviewed the images and electrodiagnostics and agree with the above interpretation.  Assessment and Plan: Ms. Haith is a pleasant 78 y.o. female with a long history including cervical spine surgery, chronic neck pain, and at least 6 to 7 months of progressive right upper extremity numbness and tingling and pain that wakes her from  sleeping.  She states that she has to wake up to shake out her hand.  She is also having worsening weakness in her hand and fingers.  Has also lost sensation.  She states that she wakes up every night with a numb hand and has to shake it out.  She has also lost some function.  She has noticed that she is lost mass in her hand as well.  This is causing a significant decrease in her quality of life.. The symptoms are causing a significant impact on the patient's life. I have utilized the care everywhere function in epic to review the outside records available from external health systems, and have personally reviewed relevant imaging and electrodiagnostic workup.  We discussed carpal tunnel release with ultrasound guidance given her progressive symptoms, we discussed that there would be a risk of wrist pain, numbness tingling weakness, failure to improve her symptoms.  We spoke that with her neck issues as well that her likelihood for full recovery is lower than if it were an isolated median neuropathy.  She like to go forward with the procedure.   Thank you for involving me in the care of this patient.    Lovenia Kim MD/MSCR Neurosurgery - Peripheral Nerve Surgery

## 2023-11-12 ENCOUNTER — Other Ambulatory Visit: Payer: Self-pay

## 2023-11-12 ENCOUNTER — Encounter: Payer: Self-pay | Admitting: Neurosurgery

## 2023-11-12 ENCOUNTER — Ambulatory Visit (INDEPENDENT_AMBULATORY_CARE_PROVIDER_SITE_OTHER): Payer: 59 | Admitting: Neurosurgery

## 2023-11-12 VITALS — BP 130/84 | Ht 65.0 in | Wt 127.0 lb

## 2023-11-12 DIAGNOSIS — Z01818 Encounter for other preprocedural examination: Secondary | ICD-10-CM

## 2023-11-12 DIAGNOSIS — G5601 Carpal tunnel syndrome, right upper limb: Secondary | ICD-10-CM | POA: Diagnosis not present

## 2023-11-12 NOTE — Patient Instructions (Signed)
 Please see below for information in regards to your upcoming surgery:   Planned surgery: Right carpal tunnel release with ultrasound guidance   Surgery date: 11/29/23 at [facility] (Medical Mall: [address] [place], [place] [address]) - you will find out your arrival time the business day before your surgery.   Pre-op appointment at [facility] Pre-admit Testing: we will call you with a date/time for this. If you are scheduled for an in person appointment, Pre-admit Testing is located on the first floor of the [facility] building, [address] [facility], [address]. Please bring all prescriptions in the original prescription bottles to your appointment. During this appointment, they will advise you which medications you can take the morning of surgery, and which medications you will need to hold for surgery. Labs (such as blood work, EKG) may be done at your pre-op appointment. You are not required to fast for these labs. Should you need to change your pre-op appointment, please call Pre-admit testing at [phone].     Blood thinners:   Aspirin 81mg :  OK to stay on aspirin 81mg     How to contact us:  If you have any questions/concerns before or after surgery, you can reach [place] at [phone], or you can send a mychart message. We can be reached by phone or mychart 8am-4pm, Monday-Friday.  *Please note: Calls after 4pm are forwarded to a third party answering service. Mychart messages are not routinely monitored during evenings, weekends, and holidays. Please call our office to contact the answering service for urgent concerns during non-business hours.    If you have FMLA/disability paperwork, please drop it off or fax it to [phone], attention Patty.   Appointments/FMLA & disability paperwork: Joycelyn Rua, & Flonnie Hailstone Registered Nurse/Surgery scheduler: Royston Cowper Medical Assistants: Nash Mantis Physician Assistants: Joan Flores, PA-C,  Manning Charity, PA-C & Drake Leach, PA-C Surgeons: Venetia Night, MD & Ernestine Mcmurray, MD

## 2023-11-15 ENCOUNTER — Other Ambulatory Visit: Payer: Self-pay | Admitting: Physician Assistant

## 2023-11-19 ENCOUNTER — Other Ambulatory Visit: Payer: Self-pay | Admitting: Internal Medicine

## 2023-11-20 NOTE — Telephone Encounter (Signed)
 Requested medications are due for refill today.  Unsure  Requested medications are on the active medications list.  yes  Last refill. 10/19/2023  Future visit scheduled.   no  Notes to clinic.  Medication is historical.    Requested Prescriptions  Pending Prescriptions Disp Refills   buPROPion (WELLBUTRIN XL) 150 MG 24 hr tablet [Pharmacy Med Name: BUPROPION XL 150MG  TAB 150 Tablet] 30 tablet 10    Sig: TAKE 1 TABLET BY MOUTH ONCE DAILY *PATIENT NEEDS TO KEEP UPCOMING APPOINTMENT*     Psychiatry: Antidepressants - bupropion Passed - 11/20/2023  5:35 PM      Passed - Cr in normal range and within 360 days    Creat  Date Value Ref Range Status  04/03/2023 0.90 0.60 - 1.00 mg/dL Final   Creatinine, Ser  Date Value Ref Range Status  04/12/2023 0.82 0.44 - 1.00 mg/dL Final   Creatinine, Urine  Date Value Ref Range Status  01/02/2023 87 20 - 275 mg/dL Final         Passed - AST in normal range and within 360 days    AST  Date Value Ref Range Status  04/03/2023 14 10 - 35 U/L Final   SGOT(AST)  Date Value Ref Range Status  09/01/2014 20 15 - 37 Unit/L Final         Passed - ALT in normal range and within 360 days    ALT  Date Value Ref Range Status  04/03/2023 14 6 - 29 U/L Final   SGPT (ALT)  Date Value Ref Range Status  09/01/2014 24 U/L Final    Comment:    14-63 NOTE: New Reference Range 04/07/14          Passed - Completed PHQ-2 or PHQ-9 in the last 360 days      Passed - Last BP in normal range    BP Readings from Last 1 Encounters:  11/12/23 130/84         Passed - Valid encounter within last 6 months    Recent Outpatient Visits           7 months ago Encounter for general adult medical examination with abnormal findings   [place] [name] [place], [name], NP   9 months ago Chronic right shoulder pain   [place] [facility] [place], [name], NP   10 months ago Controlled type 2 diabetes mellitus with  complication, without long-term current use of insulin [facility])   [place] [name] [place], [place], NP   2 years ago Nonspecific chest pain   [place] [facility] [place], [name], NP   2 years ago Chest pain, unspecified type   [name], [name], [place]

## 2023-11-21 ENCOUNTER — Inpatient Hospital Stay: Admission: RE | Admit: 2023-11-21 | Payer: 59 | Source: Ambulatory Visit

## 2023-11-21 HISTORY — DX: Carpal tunnel syndrome, right upper limb: G56.01

## 2023-11-21 HISTORY — DX: Iron deficiency anemia, unspecified: D50.9

## 2023-11-21 HISTORY — DX: Malignant neoplasm of bladder, unspecified: C67.9

## 2023-11-21 HISTORY — DX: Hemiplegia and hemiparesis following cerebral infarction affecting left non-dominant side: I69.354

## 2023-11-21 HISTORY — DX: Deficiency of other specified B group vitamins: E53.8

## 2023-11-21 HISTORY — DX: Type 2 diabetes mellitus without complications: E11.9

## 2023-11-22 ENCOUNTER — Other Ambulatory Visit: Payer: Self-pay

## 2023-11-22 ENCOUNTER — Encounter
Admission: RE | Admit: 2023-11-22 | Discharge: 2023-11-22 | Disposition: A | Discharge status: Home / Self Care | Source: Ambulatory Visit | Attending: Neurosurgery | Admitting: Neurosurgery

## 2023-11-22 ENCOUNTER — Telehealth: Payer: Self-pay

## 2023-11-22 DIAGNOSIS — Z01812 Encounter for preprocedural laboratory examination: Secondary | ICD-10-CM

## 2023-11-22 DIAGNOSIS — Z01818 Encounter for other preprocedural examination: Secondary | ICD-10-CM

## 2023-11-22 DIAGNOSIS — I700 Atherosclerosis of aorta: Secondary | ICD-10-CM

## 2023-11-22 DIAGNOSIS — J410 Simple chronic bronchitis: Secondary | ICD-10-CM

## 2023-11-22 DIAGNOSIS — D500 Iron deficiency anemia secondary to blood loss (chronic): Secondary | ICD-10-CM

## 2023-11-22 DIAGNOSIS — I6523 Occlusion and stenosis of bilateral carotid arteries: Secondary | ICD-10-CM

## 2023-11-22 DIAGNOSIS — Z01810 Encounter for preprocedural cardiovascular examination: Secondary | ICD-10-CM

## 2023-11-22 DIAGNOSIS — E118 Type 2 diabetes mellitus with unspecified complications: Secondary | ICD-10-CM

## 2023-11-22 NOTE — Telephone Encounter (Signed)
 I received the following message from [name], RN with pre-admit testing: "Hi! So I have called this pt for 2 days now and left multiple messages and have not been able to reach her. Also called her 3 sons listed and vm is either full or call cannot be completed. Can you reach out to this pt to let her know she needs to call us back ASAP? She has to have labs and EKG. Thanks! "  I left a message on pt's # to contact PAT to reschedule missed appointment ASAP in order to avoid having to cancel her surgery. I also called emergency contact [place], no answer, voicemail not set up.

## 2023-11-22 NOTE — Patient Instructions (Signed)
 Your procedure is scheduled on:11-29-23 Thursday Report to the Registration Desk on the 1st floor of the [facility].Then proceed to the 2nd floor Surgery Desk To find out your arrival time, please call [phone] between 1PM - 3PM on:11-28-23 Wednesday If your arrival time is 6:00 am, do not arrive before that time as the [facility] entrance doors do not open until 6:00 am.  REMEMBER: Instructions that are not followed completely may result in serious medical risk, up to and including death; or upon the discretion of your surgeon and anesthesiologist your surgery may need to be rescheduled.  Do not eat food after midnight the night before surgery.  No gum chewing or hard candies.  You may however, drink Water up to 2 hours before you are scheduled to arrive for your surgery. Do not drink anything within 2 hours of your scheduled arrival time.  One week prior to surgery: Stop ANY OVER THE COUNTER supplements until after surgery.  You may however, continue to take Tylenol if needed for pain up until the day of surgery.  Continue taking all of your other prescription medications up until the day of surgery.  ON THE DAY OF SURGERY ONLY TAKE THESE MEDICATIONS WITH SIPS OF WATER: -buPROPion (WELLBUTRIN XL)   No Alcohol for 24 hours before or after surgery.  No Smoking including e-cigarettes for 24 hours before surgery.  No chewable tobacco products for at least 6 hours before surgery.  No nicotine patches on the day of surgery.  Do not use any "recreational" drugs for at least a week (preferably 2 weeks) before your surgery.  Please be advised that the combination of cocaine and anesthesia may have negative outcomes, up to and including death. If you test positive for cocaine, your surgery will be cancelled.  On the morning of surgery brush your teeth with toothpaste and water, you may rinse your mouth with mouthwash if you wish. Do not swallow any toothpaste or mouthwash.  Use  CHG Soap as directed on instruction sheet.  Do not wear jewelry, make-up, hairpins, clips or nail polish.  For welded (permanent) jewelry: bracelets, anklets, waist bands, etc.  Please have this removed prior to surgery.  If it is not removed, there is a chance that hospital personnel will need to cut it off on the day of surgery.  Do not wear lotions, powders, or perfumes.   Do not shave body hair from the neck down 48 hours before surgery.  Contact lenses, hearing aids and dentures may not be worn into surgery.  Do not bring valuables to the hospital. [facility] is not responsible for any missing/lost belongings or valuables.   Notify your doctor if there is any change in your medical condition (cold, fever, infection).  Wear comfortable clothing (specific to your surgery type) to the hospital.  After surgery, you can help prevent lung complications by doing breathing exercises.  Take deep breaths and cough every 1-2 hours. Your doctor may order a device called an Incentive Spirometer to help you take deep breaths. When coughing or sneezing, hold a pillow firmly against your incision with both hands. This is called "splinting." Doing this helps protect your incision. It also decreases belly discomfort.  If you are being admitted to the hospital overnight, leave your suitcase in the car. After surgery it may be brought to your room.  In case of increased patient census, it may be necessary for you, the patient, to continue your postoperative care in the Same Day  Surgery department.  If you are being discharged the day of surgery, you will not be allowed to drive home. You will need a responsible individual to drive you home and stay with you for 24 hours after surgery.   If you are taking public transportation, you will need to have a responsible individual with you.  Please call the Pre-admissions Testing Dept. at [phone] if you have any questions about these  instructions.  Surgery Visitation Policy:  Patients having surgery or a procedure may have two visitors.  Children under the age of 6 must have an adult with them who is not the patient.  Temporary Visitor Restrictions Due to increasing cases of flu, RSV and COVID-19: Children ages 19 and under will not be able to visit patients in [facility] hospitals under most circumstances.     Preparing for Surgery with CHLORHEXIDINE GLUCONATE (CHG) Soap  Chlorhexidine Gluconate (CHG) Soap  o An antiseptic cleaner that kills germs and bonds with the skin to continue killing germs even after washing  o Used for showering the night before surgery and morning of surgery  Before surgery, you can play an important role by reducing the number of germs on your skin.  CHG (Chlorhexidine gluconate) soap is an antiseptic cleanser which kills germs and bonds with the skin to continue killing germs even after washing.  Please do not use if you have an allergy to CHG or antibacterial soaps. If your skin becomes reddened/irritated stop using the CHG.  1. Shower the NIGHT BEFORE SURGERY and the MORNING OF SURGERY with CHG soap.  2. If you choose to wash your hair, wash your hair first as usual with your normal shampoo.  3. After shampooing, rinse your hair and body thoroughly to remove the shampoo.  4. Use CHG as you would any other liquid soap. You can apply CHG directly to the skin and wash gently with a scrungie or a clean washcloth.  5. Apply the CHG soap to your body only from the neck down. Do not use on open wounds or open sores. Avoid contact with your eyes, ears, mouth, and genitals (private parts). Wash face and genitals (private parts) with your normal soap.  6. Wash thoroughly, paying special attention to the area where your surgery will be performed.  7. Thoroughly rinse your body with warm water.  8. Do not shower/wash with your normal soap after using and rinsing off the CHG  soap.  9. Pat yourself dry with a clean towel.  10. Wear clean pajamas to bed the night before surgery.  12. Place clean sheets on your bed the night of your first shower and do not sleep with pets.  13. Shower again with the CHG soap on the day of surgery prior to arriving at the hospital.  14. Do not apply any deodorants/lotions/powders.  15. Please wear clean clothes to the hospital.

## 2023-11-22 NOTE — Telephone Encounter (Signed)
 Transferred patient to PAT

## 2023-11-23 NOTE — Progress Notes (Signed)
 Called pt to do her anesthesia interview for upcoming CTR. Pt states that she is going to have an Benedetto Goad drive her home after her surgery with Dr Katrinka Blazing. I informed pt that she has to have someone in the [place] with her to go home. Pt states she does not have anyone. States her son was Clelia Schaumann come but he has to go to court that day. I informed pt that she has to have someone or surgery will be cancelled. Pt states that she will have to find someone. I told pt to call Dr Lonn Georgia office if she can't find someone to be in the [place] with her. Pt verbalized understanding. Quentin Mulling NP notified Royston Cowper of this on 11-23-23

## 2023-11-26 ENCOUNTER — Encounter: Payer: Self-pay | Admitting: Urgent Care

## 2023-11-26 ENCOUNTER — Telehealth: Payer: Self-pay

## 2023-11-26 ENCOUNTER — Encounter
Admission: RE | Admit: 2023-11-26 | Discharge: 2023-11-26 | Disposition: A | Discharge status: Home / Self Care | Source: Ambulatory Visit | Attending: Neurosurgery | Admitting: Neurosurgery

## 2023-11-26 DIAGNOSIS — I700 Atherosclerosis of aorta: Secondary | ICD-10-CM | POA: Insufficient documentation

## 2023-11-26 DIAGNOSIS — J410 Simple chronic bronchitis: Secondary | ICD-10-CM | POA: Insufficient documentation

## 2023-11-26 DIAGNOSIS — Z01810 Encounter for preprocedural cardiovascular examination: Secondary | ICD-10-CM

## 2023-11-26 DIAGNOSIS — E118 Type 2 diabetes mellitus with unspecified complications: Secondary | ICD-10-CM | POA: Diagnosis not present

## 2023-11-26 DIAGNOSIS — D500 Iron deficiency anemia secondary to blood loss (chronic): Secondary | ICD-10-CM | POA: Diagnosis not present

## 2023-11-26 DIAGNOSIS — Z01812 Encounter for preprocedural laboratory examination: Secondary | ICD-10-CM

## 2023-11-26 DIAGNOSIS — I6523 Occlusion and stenosis of bilateral carotid arteries: Secondary | ICD-10-CM | POA: Diagnosis not present

## 2023-11-26 DIAGNOSIS — Z01818 Encounter for other preprocedural examination: Secondary | ICD-10-CM | POA: Insufficient documentation

## 2023-11-26 LAB — BASIC METABOLIC PANEL
Anion gap: 8 (ref 5–15)
BUN: 13 mg/dL (ref 8–23)
CO2: 27 mmol/L (ref 22–32)
Calcium: 9.5 mg/dL (ref 8.9–10.3)
Chloride: 105 mmol/L (ref 98–111)
Creatinine, Ser: 0.72 mg/dL (ref 0.44–1.00)
GFR, Estimated: >60 mL/min (ref >60)
Glucose, Bld: 95 mg/dL (ref 70–99)
Potassium: 3.9 mmol/L (ref 3.5–5.1)
Sodium: 140 mmol/L (ref 135–145)

## 2023-11-26 LAB — CBC
HCT: 35.2 % — ABNORMAL LOW (ref 36.0–46.0)
Hemoglobin: 11.7 g/dL — ABNORMAL LOW (ref 12.0–15.0)
MCH: 30.9 pg (ref 26.0–34.0)
MCHC: 33.2 g/dL (ref 30.0–36.0)
MCV: 92.9 fL (ref 80.0–100.0)
Platelets: 242 10*3/uL (ref 150–400)
RBC: 3.79 MIL/uL — ABNORMAL LOW (ref 3.87–5.11)
RDW: 13.5 % (ref 11.5–15.5)
WBC: 4.2 10*3/uL (ref 4.0–10.5)
nRBC: 0 % (ref 0.0–0.2)

## 2023-11-26 NOTE — Telephone Encounter (Signed)
 Called pt again, no answer.

## 2023-11-26 NOTE — Telephone Encounter (Signed)
 I was informed by Quentin Mulling, NP that Ms Crunkleton mentioned to the pre-admit testing office that she may not have a ride for surgery on 11/29/23 because her son has court. She informed their office she was planning on taking an [place]. The PAT office educated Ms Boston that she cannot take an Benedetto Goad for surgery.   If she does not have a ride for surgery on 11/29/23, her surgery will need to be rescheduled.  I have discussed the above with Dr Katrinka Blazing. If she does not have a ride for 11/29/23, we may be able to reschedule her surgery to 11/30/23.

## 2023-11-26 NOTE — Telephone Encounter (Signed)
 I called the patient x 2, but she did not answer and her voicemail is full.

## 2023-11-27 NOTE — Telephone Encounter (Signed)
Called pt again, no answer, mailbox still full.

## 2023-11-27 NOTE — Telephone Encounter (Signed)
 No answer

## 2023-11-28 MED ORDER — SODIUM CHLORIDE 0.9 % IV SOLN: INTRAVENOUS | Status: DC

## 2023-11-28 MED ORDER — CEFAZOLIN IN SODIUM CHLORIDE 2-0.9 GM/100ML-% IV SOLN
2.0000 g | Freq: Once | INTRAVENOUS | Status: DC
Start: 2023-11-28 — End: 2023-11-29
  Filled 2023-11-28: qty 100

## 2023-11-28 MED ORDER — ORAL CARE MOUTH RINSE: 15.0000 mL | Freq: Once | OROMUCOSAL | Status: DC

## 2023-11-28 MED ORDER — CHLORHEXIDINE GLUCONATE 0.12 % MT SOLN: 15.0000 mL | Freq: Once | OROMUCOSAL | Status: DC

## 2023-11-28 NOTE — Telephone Encounter (Signed)
 No answer, voicemail still full. Sent "SMS notification" via voicemail.

## 2023-11-28 NOTE — Telephone Encounter (Signed)
 Discussed with Dr Katrinka Blazing and Nehemiah Settle that I have not been able to reach patient.

## 2023-11-29 ENCOUNTER — Encounter: Payer: Self-pay | Admitting: Neurosurgery

## 2023-11-29 ENCOUNTER — Other Ambulatory Visit: Payer: Self-pay

## 2023-11-29 ENCOUNTER — Encounter
Admission: RE | Disposition: A | Discharge status: Home / Self Care | Payer: Self-pay | Source: Home / Self Care | Attending: Neurosurgery

## 2023-11-29 ENCOUNTER — Ambulatory Visit
Admission: RE | Admit: 2023-11-29 | Discharge: 2023-11-29 | Disposition: A | Discharge status: Home / Self Care | Payer: 59 | Attending: Neurosurgery | Admitting: Neurosurgery

## 2023-11-29 DIAGNOSIS — G5601 Carpal tunnel syndrome, right upper limb: Secondary | ICD-10-CM | POA: Diagnosis not present

## 2023-11-29 DIAGNOSIS — Z539 Procedure and treatment not carried out, unspecified reason: Secondary | ICD-10-CM | POA: Insufficient documentation

## 2023-11-29 DIAGNOSIS — Z01818 Encounter for other preprocedural examination: Secondary | ICD-10-CM

## 2023-11-29 LAB — GLUCOSE, CAPILLARY: Glucose-Capillary: 104 mg/dL — ABNORMAL HIGH (ref 70–99)

## 2023-11-29 SURGERY — CARPAL TUNNEL RELEASE
Anesthesia: Monitor Anesthesia Care | Laterality: Right

## 2023-11-29 MED ORDER — CEFAZOLIN SODIUM-DEXTROSE 2-4 GM/100ML-% IV SOLN
INTRAVENOUS | Status: AC
  Filled 2023-11-29: qty 100

## 2023-11-29 MED ORDER — CEFAZOLIN SODIUM-DEXTROSE 2-4 GM/100ML-% IV SOLN: 2.0000 g | INTRAVENOUS | Status: DC

## 2023-11-29 MED ORDER — CHLORHEXIDINE GLUCONATE 0.12 % MT SOLN
OROMUCOSAL | Status: AC
  Filled 2023-11-29: qty 15

## 2023-11-29 NOTE — H&P (Signed)
 [name] presented to the preoperative area but unfortunately did not have a ride to her from the hospital and utilized a ride share.  We discussed with her that it would not be safe for [place] to discharge her with a ride share after having a surgical procedure.  We let her know that we will reach out to her to reschedule this as soon as we can.  She was instructed not to take a ride share by both preop clearance as well as our clinical nurse lead.  Unfortunately it was her only option to get here this morning.  Will plan on rescheduling for decompression at another time.

## 2023-11-29 NOTE — Progress Notes (Signed)
 Patient was supposed to be here at 630am, arrived at 41am by Benedetto Goad. Dr Katrinka Blazing made aware. Patient has no ride home except for [place]. Family contacted by patient and no one is able to take her home or ride with her. Procedure canceled.

## 2023-12-03 ENCOUNTER — Other Ambulatory Visit: Payer: Self-pay

## 2023-12-03 ENCOUNTER — Telehealth: Payer: Self-pay

## 2023-12-03 DIAGNOSIS — Z01818 Encounter for other preprocedural examination: Secondary | ICD-10-CM

## 2023-12-03 NOTE — Telephone Encounter (Signed)
 Received message from the answering service at 4:21pm that pt is requesting I call her back. I tried calling her, but she did not answer and her mailbox is still full.

## 2023-12-03 NOTE — Telephone Encounter (Signed)
 Attempted to contact Kendra [name] to discuss rescheduling her surgery. Her phone goes straight to voicemail and her voicemail is full. Sent "SMS notification" with our office # to her voicemail.

## 2023-12-03 NOTE — Telephone Encounter (Signed)
 I spoke with Kendra [name]. She would like to reschedule surgery. I reminded her that she will need to have a ride. She states her neighbor is going to give her a ride. Surgery has been rescheduled for 12/13/23.

## 2023-12-04 NOTE — Telephone Encounter (Signed)
 Latisha from [facility] called stating this patient called her yesterday and was confused about her surgery date. I attempted to call the patient to discuss, but her mailbox is full.

## 2023-12-07 ENCOUNTER — Inpatient Hospital Stay
Admission: RE | Admit: 2023-12-07 | Discharge: 2023-12-07 | Disposition: A | Discharge status: Home / Self Care | Source: Ambulatory Visit

## 2023-12-07 NOTE — Pre-Procedure Instructions (Signed)
 This Clinical research associate have tried multiple time to reach patient for her follow up PAT call, not able to leave VM, but did send SMS notification, Sharlot Gowda RN made aware and is trying to reach her as well.

## 2023-12-10 ENCOUNTER — Other Ambulatory Visit

## 2023-12-12 ENCOUNTER — Encounter: Payer: Self-pay | Admitting: Anesthesiology

## 2023-12-12 ENCOUNTER — Encounter: Payer: 59 | Admitting: Physician Assistant

## 2023-12-12 MED ORDER — CEFAZOLIN SODIUM-DEXTROSE 2-4 GM/100ML-% IV SOLN
2.0000 g | INTRAVENOUS | Status: AC
  Administered 2024-09-17: 2 g via INTRAVENOUS

## 2023-12-12 MED ORDER — ORAL CARE MOUTH RINSE: 15.0000 mL | Freq: Once | OROMUCOSAL | Status: AC

## 2023-12-12 MED ORDER — CHLORHEXIDINE GLUCONATE 0.12 % MT SOLN: 15.0000 mL | Freq: Once | OROMUCOSAL | Status: AC

## 2023-12-12 MED ORDER — CEFAZOLIN IN SODIUM CHLORIDE 2-0.9 GM/100ML-% IV SOLN
2.0000 g | Freq: Once | INTRAVENOUS | Status: DC
  Filled 2023-12-12: qty 100

## 2023-12-12 MED ORDER — SODIUM CHLORIDE 0.9 % IV SOLN: INTRAVENOUS | Status: AC

## 2023-12-13 ENCOUNTER — Ambulatory Visit: Admission: RE | Admit: 2023-12-13 | Source: Home / Self Care | Admitting: Neurosurgery

## 2023-12-13 ENCOUNTER — Telehealth: Payer: Self-pay

## 2023-12-13 ENCOUNTER — Encounter: Admission: RE | Payer: Self-pay | Source: Home / Self Care

## 2023-12-13 DIAGNOSIS — Z01812 Encounter for preprocedural laboratory examination: Secondary | ICD-10-CM

## 2023-12-13 DIAGNOSIS — Z01818 Encounter for other preprocedural examination: Secondary | ICD-10-CM

## 2023-12-13 SURGERY — CARPAL TUNNEL RELEASE
Anesthesia: Monitor Anesthesia Care | Laterality: Right

## 2023-12-13 NOTE — Telephone Encounter (Signed)
 Ms Codd called in to inquire what time her surgery is tomorrow, 3/27. Flonnie Hailstone informed her that today is 3/27 and that she missed her surgery. She stated "no one called me". I explained that myself, the OR, and pre-admit testing called her several times over the past few days and her phone rings several times, but it goes to voicemail and her voicemail is full. She stated that she may need a new phone. I have rescheduled her surgery to 01/10/23. I have notified her that her new pre-admit testing appointment is a phone call on 01/03/24 between 8am and 1pm. I have also provided her with the # to pre-admit testing and same day surgery.

## 2023-12-13 NOTE — Telephone Encounter (Signed)
 Multiple attempts were made yesterday by the OR staff and myself to reach Ms Kendra [name]. Her phone goes to voicemail and you cannot leave a message. Ms Kendra [name] did not show up for surgery this morning. All post op appointments have been canceled. At this time, we will wait for her to contact us. This was her 2nd surgery cancellation on the day of. Per Dr Katrinka Blazing, we will allow a reschedule one more time.

## 2023-12-13 NOTE — Telephone Encounter (Signed)
 Kendra [name] has placed a copy of all of her appointments and surgery instructions in the mail.

## 2023-12-18 ENCOUNTER — Other Ambulatory Visit: Payer: Self-pay | Admitting: Internal Medicine

## 2023-12-18 DIAGNOSIS — J449 Chronic obstructive pulmonary disease, unspecified: Secondary | ICD-10-CM

## 2023-12-20 NOTE — Telephone Encounter (Signed)
 Requested Prescriptions  Pending Prescriptions Disp Refills   albuterol (VENTOLIN HFA) 108 (90 Base) MCG/ACT inhaler [Pharmacy Med Name: ALBUTEROL HFA *PROA* 108 (90 BAS Aerosol] 8.5 g 4    Sig: INHALE 1-2 PUFFS INTO THE LUNGS EVERY 6 HOURS AS NEEDED     Pulmonology:  Beta Agonists 2 Failed - 12/20/2023 12:25 PM      Failed - Last BP in normal range    BP Readings from Last 1 Encounters:  11/29/23 (!) 152/98         Failed - Valid encounter within last 12 months    Recent Outpatient Visits   None            Passed - Last Heart Rate in normal range    Pulse Readings from Last 1 Encounters:  11/29/23 91

## 2023-12-26 ENCOUNTER — Encounter: Admitting: Physician Assistant

## 2023-12-31 ENCOUNTER — Telehealth: Payer: Self-pay | Admitting: Neurosurgery

## 2023-12-31 NOTE — Telephone Encounter (Signed)
 Patient called and states that she does not have transportation for her upcoming surgery. Please advise.

## 2023-12-31 NOTE — Telephone Encounter (Signed)
 Surgery has been canceled. Perhaps it would be best if we call her to schedule once we start doing carpal tunnel surgery in office?

## 2023-12-31 NOTE — Telephone Encounter (Deleted)
 Will need to discuss with Dr. Felipe Horton regarding situation and rescheduling potentially to when we can do this office.

## 2024-01-03 ENCOUNTER — Inpatient Hospital Stay: Admission: RE | Admit: 2024-01-03 | Source: Ambulatory Visit

## 2024-01-07 ENCOUNTER — Encounter: Payer: 59 | Admitting: Neurosurgery

## 2024-01-10 ENCOUNTER — Ambulatory Visit: Admit: 2024-01-10 | Admitting: Neurosurgery

## 2024-01-10 SURGERY — CARPAL TUNNEL RELEASE
Anesthesia: Monitor Anesthesia Care | Laterality: Right

## 2024-01-16 ENCOUNTER — Other Ambulatory Visit: Payer: Self-pay | Admitting: Internal Medicine

## 2024-01-16 DIAGNOSIS — J432 Centrilobular emphysema: Secondary | ICD-10-CM

## 2024-01-17 NOTE — Telephone Encounter (Signed)
 Requested medication (s) are due for refill today - yes  Requested medication (s) are on the active medication list -yes  Future visit scheduled -no  Last refill: 05/17/23 60each 5RF  Notes to clinic: off protocol- provider review   Requested Prescriptions  Pending Prescriptions Disp Refills   TRELEGY ELLIPTA  100-62.5-25 MCG/ACT AEPB [Pharmacy Med Name: TRELEGY ELLIPTA  100-62.5-25 100-62.5-25 Aerosol] 60 each 11    Sig: INHALE 1 PUFF INTO THE LUNGS DAILY     Off-Protocol Failed - 01/17/2024  8:38 AM      Failed - Medication not assigned to a protocol, review manually.      Failed - Valid encounter within last 12 months    Recent Outpatient Visits   None               Requested Prescriptions  Pending Prescriptions Disp Refills   TRELEGY ELLIPTA  100-62.5-25 MCG/ACT AEPB [Pharmacy Med Name: TRELEGY ELLIPTA  100-62.5-25 100-62.5-25 Aerosol] 60 each 11    Sig: INHALE 1 PUFF INTO THE LUNGS DAILY     Off-Protocol Failed - 01/17/2024  8:38 AM      Failed - Medication not assigned to a protocol, review manually.      Failed - Valid encounter within last 12 months    Recent Outpatient Visits   None

## 2024-01-21 ENCOUNTER — Encounter: Admitting: Neurosurgery

## 2024-01-23 ENCOUNTER — Encounter: Admitting: Physician Assistant

## 2024-02-15 ENCOUNTER — Encounter: Payer: 59 | Admitting: Physician Assistant

## 2024-02-20 ENCOUNTER — Encounter: Admitting: Neurosurgery

## 2024-03-03 ENCOUNTER — Encounter: Admitting: Physician Assistant

## 2024-04-28 ENCOUNTER — Ambulatory Visit: Payer: Self-pay

## 2024-04-28 ENCOUNTER — Telehealth: Payer: Self-pay

## 2024-04-28 ENCOUNTER — Encounter: Payer: Self-pay | Admitting: Oncology

## 2024-04-28 NOTE — Telephone Encounter (Signed)
 This is something that requires a face-to-face office visit.  She is also well overdue for an appointment.  Please have her schedule an appointment with me

## 2024-04-28 NOTE — Telephone Encounter (Signed)
 Tried calling patient no answer or VM

## 2024-04-28 NOTE — Telephone Encounter (Signed)
 Copied from CRM [id number]. Topic: Clinical - Order For Equipment >> Apr 28, 2024  1:39 PM Precious C wrote: Reason for CRM: Pt called in requesting another recliner due to it no longer working, stated it's completely shut off.

## 2024-04-28 NOTE — Telephone Encounter (Signed)
 Offered patient an appointment to see her PCP tomorrow 04/29/2024 but she declined and stated that she needs three days advance to get transportation arranged  FYI Only or Action Required?: Action required by provider: request for appointment, update on patient condition, and patients' recliner isn't working that Moore prescribed and patient also wants Angeline to prescribe a mailbox to be by her door since she has a wheeled chair to walk with now.  Patient was last seen in primary care on 04/03/2023 by Antonette Angeline ORN, NP.  Called Nurse Triage reporting Fatigue.  Symptoms began about a month ago.  Interventions attempted: Rest, hydration, or home remedies.  Symptoms are: gradually worsening.  Triage Disposition: See Physician Within 24 Hours  Patient/caregiver understands and will follow disposition?: No, wishes to speak with PCP           Copied from CRM #[id number]. Topic: Clinical - Red Word Triage >> Apr 28, 2024  1:46 PM Precious C wrote: [facility] that prompted transfer to Nurse Triage: [facility] STANDING ISSUE  Pt stated she weak and unable to walk and her legs give on her or properly stand, pt is stated she not in any pain. Reason for Disposition  [1] MODERATE weakness (e.g., interferes with work, school, normal activities) AND [2] persists > 3 days  Answer Assessment - Initial Assessment Questions x 1 month Legs get weak when patient stands Feel tired and sleep a lot during the day but can't sleep at night Right shoulder surgery in May or June per patient. Patient states that she fell last month--EMS came and checked her out at that time Patient states her appetite comes and goes and she can't taste her food for the past two weeks Patient uses a chair with wheels to walk around with Patient states that Angeline had ordered her a recliner but it isn't working. Patient wants a mailbox by her door since she got a chair with wheels to walk with  5:30 AM last normal bowel  movement  Patient is advised that if anything worsens to go to the Emergency Room. Patient verbalized understanding.    1. DESCRIPTION: Describe how you are feeling.     Weak and tired 2. SEVERITY: How bad is it?  Can you stand and walk?     Feels weak when she stands 3. ONSET: When did these symptoms begin? (e.g., hours, days, weeks, months)     About a month ago 4. CAUSE: What do you think is causing the weakness or fatigue? (e.g., not drinking enough fluids, medical problem, trouble sleeping)     I dont sleep good 5. NEW MEDICINES:  Have you started on any new medicines recently? (e.g., opioid pain medicines, benzodiazepines, muscle relaxants, antidepressants, antihistamines, neuroleptics, beta blockers)     Celecoxib once a day in the morning 6. OTHER SYMPTOMS: Do you have any other symptoms? (e.g., chest pain, fever, cough, SOB, vomiting, diarrhea, bleeding, other areas of pain)     Patient denies  Protocols used: Weakness (Generalized) and Fatigue-A-AH

## 2024-05-01 ENCOUNTER — Ambulatory Visit: Admitting: Internal Medicine

## 2024-05-02 ENCOUNTER — Ambulatory Visit: Admitting: Internal Medicine

## 2024-05-02 NOTE — Progress Notes (Deleted)
 Subjective:    Patient ID: Kendra [name], female    DOB: 1945-11-13, 78 y.o.   MRN: [id number]  HPI     Review of Systems     Past Medical History:  Diagnosis Date   Aortic atherosclerosis (HCC)    Asthma    Back pain    Bladder tumor    Carotid artery stenosis    Carpal tunnel syndrome of right wrist    Cataract    Cervical disc disorder    COPD (chronic obstructive pulmonary disease) (HCC)    Depression    Diabetic neuropathy (HCC)    DM (diabetes mellitus), type 2 (HCC)    Dyspnea    with exertion   GERD (gastroesophageal reflux disease)    Hematuria    Hemiparesis affecting left side as late effect of cerebrovascular accident (CVA) (HCC)    History of kidney stones    per patient can't remember the year   Hyperlipidemia    Hypertension    per patient take meds for it   IDA (iron  deficiency anemia)    Insomnia    Neuropathy    Osteoarthritis    Osteopenia    Pneumonia 2018   per patient   Reflux    Rheumatoid arthritis (HCC)    Stroke (HCC) 2015   and again 2017  - Weakness in left leg, staggers w/ walking, vision    Tenosynovitis    Urothelial carcinoma of bladder (HCC)    Vitamin B 12 deficiency     Current Outpatient Medications  Medication Sig Dispense Refill   acetaminophen  (TYLENOL  8 HOUR ARTHRITIS PAIN) 650 MG CR tablet Take 1 tablet (650 mg total) by mouth 3 (three) times daily as needed for pain. 90 tablet 1   albuterol  (VENTOLIN  HFA) 108 (90 Base) MCG/ACT inhaler INHALE 1-2 PUFFS INTO THE LUNGS EVERY 6 HOURS AS NEEDED 8.5 g 4   aspirin  EC 81 MG tablet Take 1 tablet (81 mg total) by mouth daily. Swallow whole. 90 tablet 1   atorvastatin  (LIPITOR) 40 MG tablet Take 40 mg by mouth at bedtime.     buPROPion  (WELLBUTRIN  XL) 150 MG 24 hr tablet Take 150 mg by mouth daily.     Fluticasone -Umeclidin-Vilant (TRELEGY ELLIPTA ) 100-62.5-25 MCG/ACT AEPB Inhale 1 puff into the lungs at bedtime. 60 each 5   gabapentin  (NEURONTIN ) 300 MG capsule TAKE  1 CAPSULE BY MOUTH 3 TIMES DAILY (Patient taking differently: Take 300 mg by mouth at bedtime.) 90 capsule 1   No current facility-administered medications for this visit.   Facility-Administered Medications Ordered in Other Visits  Medication Dose Route Frequency Provider Last Rate Last Admin   ceFAZolin  (ANCEF ) IVPB 2g/100 mL premix  2 g Intravenous 60 min Pre-Op Claudene Penne ORN, MD       chlorhexidine  (PERIDEX ) 0.12 % solution 15 mL  15 mL Mouth/Throat Once Chesley Lendia CROME, MD       Or   Oral care mouth rinse  15 mL Mouth Rinse Once Chesley Lendia CROME, MD        Allergies  Allergen Reactions   Citalopram  Nausea And Vomiting    Family History  Problem Relation Age of Onset   Stroke Father    Depression Father    Diabetes Mother    Hyperlipidemia Mother    Breast cancer Mother    Heart murmur Son    Heart murmur Son    Heart murmur Son     Social History   Socioeconomic  History   Marital status: Widowed    Spouse name: Not on file   Number of children: 3   Years of education: 56   Highest education level: Not on file  Occupational History   Occupation: Disabled  Tobacco Use   Smoking status: Every Day    Current packs/day: 0.25    Average packs/day: 0.3 packs/day for 61.0 years (15.3 ttl pk-yrs)    Types: Cigarettes    Passive exposure: Current   Smokeless tobacco: Former    Types: Snuff  Vaping Use   Vaping status: Never Used  Substance and Sexual Activity   Alcohol use: No   Drug use: No   Sexual activity: Never  Other Topics Concern   Not on file  Social History Narrative   Not on file   Social Drivers of Health   Financial Resource Strain: Low Risk  (03/12/2024)   Received from [facility]   Overall Financial Resource Strain (CARDIA)    Difficulty of Paying Living Expenses: Not hard at all  Food Insecurity: Food Insecurity Present (03/12/2024)   Received from [facility]   Hunger Vital Sign    Within the past 12  months, you worried that your food would run out before you got the money to buy more.: Sometimes true    Within the past 12 months, the food you bought just didn't last and you didn't have money to get more.: Sometimes true  Transportation Needs: No Transportation Needs (03/12/2024)   Received from [facility] - [facility]    In the past 12 months, has lack of transportation kept you from medical appointments or from getting medications?: No    Lack of Transportation (Non-Medical): No  Physical Activity: Sufficiently Active (06/07/2023)   Exercise Vital Sign    Days of Exercise per Week: 3 days    Minutes of Exercise per Session: 60 min  Stress: No Stress Concern Present (06/07/2023)   [employer] of Occupational Health - Occupational Stress Questionnaire    Feeling of Stress : Only a little  Social Connections: Socially Isolated (06/07/2023)   Social Connection and Isolation Panel    Frequency of Communication with Friends and Family: More than three times a week    Frequency of Social Gatherings with Friends and Family: Once a week    Attends Religious Services: Never    Database administrator or Organizations: No    Attends Banker Meetings: Never    Marital Status: Widowed  Intimate Partner Violence: Not At Risk (06/07/2023)   Humiliation, Afraid, Rape, and Kick questionnaire    Fear of Current or Ex-Partner: No    Emotionally Abused: No    Physically Abused: No    Sexually Abused: No     Constitutional: Denies fever, malaise, fatigue, headache or abrupt weight changes.  HEENT: Denies eye pain, eye redness, ear pain, ringing in the ears, wax buildup, runny nose, nasal congestion, bloody nose, or sore throat. Respiratory: Denies difficulty breathing, shortness of breath, cough or sputum production.   Cardiovascular: Denies chest pain, chest tightness, palpitations or swelling in the hands or feet.  Gastrointestinal: Denies abdominal  pain, bloating, constipation, diarrhea or blood in the stool.  GU: Denies urgency, frequency, pain with urination, burning sensation, blood in urine, odor or discharge. Musculoskeletal: Patient reports chronic joint pain, left-sided weakness.  Denies decrease in range of motion, difficulty with gait, muscle pain or joint swelling.  Skin: Denies redness,  rashes, lesions or ulcercations.  Neurological: Patient reports insomnia.  Denies dizziness, difficulty with memory, difficulty with speech or problems with balance and coordination.  Psych: Patient has a history of depression.  Denies anxiety, SI/HI.  No other specific complaints in a complete review of systems (except as listed in HPI above).  Objective:   Physical Exam  ,There were no vitals taken for this visit.  Wt Readings from Last 3 Encounters:  11/29/23 127 lb (57.6 kg)  11/12/23 127 lb (57.6 kg)  09/26/23 127 lb (57.6 kg)    General: Appears her stated age, chronically ill appearing, in NAD. Skin: Warm, dry and intact. No ulcerations noted. HEENT: Head: normal shape and size; Eyes: sclera white, no icterus, conjunctiva pink, PERRLA and EOMs intact;  Neck:  Neck supple, trachea midline. No masses, lumps or thyromegaly present.  Cardiovascular: Normal rate and rhythm. S1,S2 noted.  No murmur, rubs or gallops noted. No JVD or BLE edema. No carotid bruits noted. Pulmonary/Chest: Normal effort and coarse breath sounds. No respiratory distress. No wheezes, rales or ronchi noted.  Abdomen: Soft and nontender. Normal bowel sounds.  Musculoskeletal: Gait slow and steady with cane. Neurological: Alert and oriented. Cranial nerves II-XII grossly intact. Coordination normal.  Psychiatric: Mood and affect normal. Behavior is normal. Judgment and thought content normal.    BMET    Component Value Date/Time   NA 140 11/26/2023 0818   NA 141 01/18/2016 1150   NA 138 09/01/2014 2130   K 3.9 11/26/2023 0818   K 3.5 09/01/2014 2130   CL  105 11/26/2023 0818   CL 104 09/01/2014 2130   CO2 27 11/26/2023 0818   CO2 28 09/01/2014 2130   GLUCOSE 95 11/26/2023 0818   GLUCOSE 287 (H) 09/01/2014 2130   BUN 13 11/26/2023 0818   BUN 12 01/18/2016 1150   BUN 14 09/01/2014 2130   CREATININE 0.72 11/26/2023 0818   CREATININE 0.90 04/03/2023 1123   CALCIUM  9.5 11/26/2023 0818   CALCIUM  8.9 09/01/2014 2130   GFRNONAA >60 11/26/2023 0818   GFRNONAA 70 06/14/2020 0929   GFRAA 82 06/14/2020 0929    Lipid Panel     Component Value Date/Time   CHOL 112 04/03/2023 1123   CHOL 177 06/07/2015 1215   CHOL 141 09/02/2014 0440   TRIG 92 04/03/2023 1123   TRIG 155 09/02/2014 0440   HDL 65 04/03/2023 1123   HDL 56 06/07/2015 1215   HDL 37 (L) 09/02/2014 0440   CHOLHDL 1.7 04/03/2023 1123   VLDL 16 10/17/2019 0606   VLDL 31 09/02/2014 0440   LDLCALC 29 04/03/2023 1123   LDLCALC 73 09/02/2014 0440    CBC    Component Value Date/Time   WBC 4.2 11/26/2023 0818   RBC 3.79 (L) 11/26/2023 0818   HGB 11.7 (L) 11/26/2023 0818   HGB 12.7 09/01/2014 2130   HCT 35.2 (L) 11/26/2023 0818   HCT 37.9 09/01/2014 2130   PLT 242 11/26/2023 0818   PLT 260 09/05/2014 0517   MCV 92.9 11/26/2023 0818   MCV 92 09/01/2014 2130   MCH 30.9 11/26/2023 0818   MCHC 33.2 11/26/2023 0818   RDW 13.5 11/26/2023 0818   RDW 13.9 09/01/2014 2130   LYMPHSABS 1.7 08/22/2022 1209   MONOABS 0.3 08/22/2022 1209   EOSABS 0.1 08/22/2022 1209   BASOSABS 0.0 08/22/2022 1209    Hgb A1C Lab Results  Component Value Date   HGBA1C 6.8 (H) 04/03/2023  Assessment & Plan:     Schedule an appointment for follow-up chronic conditions Angeline Laura, NP

## 2024-05-13 ENCOUNTER — Encounter: Payer: Self-pay | Admitting: Internal Medicine

## 2024-05-13 ENCOUNTER — Ambulatory Visit (INDEPENDENT_AMBULATORY_CARE_PROVIDER_SITE_OTHER): Admitting: Internal Medicine

## 2024-05-13 VITALS — BP 110/60 | HR 106 | Ht 65.0 in | Wt 134.8 lb

## 2024-05-13 DIAGNOSIS — E118 Type 2 diabetes mellitus with unspecified complications: Secondary | ICD-10-CM

## 2024-05-13 DIAGNOSIS — Z0001 Encounter for general adult medical examination with abnormal findings: Secondary | ICD-10-CM | POA: Diagnosis not present

## 2024-05-13 DIAGNOSIS — E1169 Type 2 diabetes mellitus with other specified complication: Secondary | ICD-10-CM

## 2024-05-13 DIAGNOSIS — B351 Tinea unguium: Secondary | ICD-10-CM

## 2024-05-13 DIAGNOSIS — E785 Hyperlipidemia, unspecified: Secondary | ICD-10-CM

## 2024-05-13 DIAGNOSIS — Z122 Encounter for screening for malignant neoplasm of respiratory organs: Secondary | ICD-10-CM

## 2024-05-13 NOTE — Progress Notes (Signed)
 Subjective:    Patient ID: Kendra [name], female    DOB: 07/05/1946, 78 y.o.   MRN: [id number]  HPI  Patient presents to clinic today for her annual exam.  Flu: 08/2022 Tetanus: 07/2010 COVID: X 2 Pneumovax: 07/2018 Prevnar: 10/2014 Zostavax: 05/2013 Shingrix: Never Pap smear: 10/2014 Mammogram: 09/2020 Bone density: 07/2010 Colon screening: 01/2006 Vision screening: as needed Dentist: as needed  Diet: She does eat meat. She consumes fruits and veggies. She tries to avoid fried foods. She drinks mostly water, coffee. Exercise: None   Review of Systems     Past Medical History:  Diagnosis Date   Aortic atherosclerosis (HCC)    Asthma    Back pain    Bladder tumor    Carotid artery stenosis    Carpal tunnel syndrome of right wrist    Cataract    Cervical disc disorder    COPD (chronic obstructive pulmonary disease) (HCC)    Depression    Diabetic neuropathy (HCC)    DM (diabetes mellitus), type 2 (HCC)    Dyspnea    with exertion   GERD (gastroesophageal reflux disease)    Hematuria    Hemiparesis affecting left side as late effect of cerebrovascular accident (CVA) (HCC)    History of kidney stones    per patient can't remember the year   Hyperlipidemia    Hypertension    per patient take meds for it   IDA (iron  deficiency anemia)    Insomnia    Neuropathy    Osteoarthritis    Osteopenia    Pneumonia 2018   per patient   Reflux    Rheumatoid arthritis (HCC)    Stroke (HCC) 2015   and again 2017  - Weakness in left leg, staggers w/ walking, vision    Tenosynovitis    Urothelial carcinoma of bladder (HCC)    Vitamin B 12 deficiency     Current Outpatient Medications  Medication Sig Dispense Refill   acetaminophen  (TYLENOL  8 HOUR ARTHRITIS PAIN) 650 MG CR tablet Take 1 tablet (650 mg total) by mouth 3 (three) times daily as needed for pain. 90 tablet 1   albuterol  (VENTOLIN  HFA) 108 (90 Base) MCG/ACT inhaler INHALE 1-2 PUFFS INTO THE LUNGS EVERY  6 HOURS AS NEEDED 8.5 g 4   aspirin  EC 81 MG tablet Take 1 tablet (81 mg total) by mouth daily. Swallow whole. 90 tablet 1   atorvastatin  (LIPITOR) 40 MG tablet Take 40 mg by mouth at bedtime.     buPROPion  (WELLBUTRIN  XL) 150 MG 24 hr tablet Take 150 mg by mouth daily.     Fluticasone -Umeclidin-Vilant (TRELEGY ELLIPTA ) 100-62.5-25 MCG/ACT AEPB Inhale 1 puff into the lungs at bedtime. 60 each 5   gabapentin  (NEURONTIN ) 300 MG capsule TAKE 1 CAPSULE BY MOUTH 3 TIMES DAILY (Patient taking differently: Take 300 mg by mouth at bedtime.) 90 capsule 1   No current facility-administered medications for this visit.   Facility-Administered Medications Ordered in Other Visits  Medication Dose Route Frequency Provider Last Rate Last Admin   ceFAZolin  (ANCEF ) IVPB 2g/100 mL premix  2 g Intravenous 60 min Pre-Op Claudene Penne ORN, MD       chlorhexidine  (PERIDEX ) 0.12 % solution 15 mL  15 mL Mouth/Throat Once Chesley Lendia CROME, MD       Or   Oral care mouth rinse  15 mL Mouth Rinse Once Chesley Lendia CROME, MD        Allergies  Allergen Reactions   Citalopram   Nausea And Vomiting    Family History  Problem Relation Age of Onset   Stroke Father    Depression Father    Diabetes Mother    Hyperlipidemia Mother    Breast cancer Mother    Heart murmur Son    Heart murmur Son    Heart murmur Son     Social History   Socioeconomic History   Marital status: Widowed    Spouse name: Not on file   Number of children: 3   Years of education: 77   Highest education level: Not on file  Occupational History   Occupation: Disabled  Tobacco Use   Smoking status: Every Day    Current packs/day: 0.25    Average packs/day: 0.3 packs/day for 61.0 years (15.3 ttl pk-yrs)    Types: Cigarettes    Passive exposure: Current   Smokeless tobacco: Former    Types: Snuff  Vaping Use   Vaping status: Never Used  Substance and Sexual Activity   Alcohol use: No   Drug use: No   Sexual activity: Never  Other  Topics Concern   Not on file  Social History Narrative   Not on file   Social Drivers of Health   Financial Resource Strain: Low Risk  (03/12/2024)   Received from [facility]   Overall Financial Resource Strain (CARDIA)    Difficulty of Paying Living Expenses: Not hard at all  Food Insecurity: Food Insecurity Present (03/12/2024)   Received from [facility]   Hunger Vital Sign    Within the past 12 months, you worried that your food would run out before you got the money to buy more.: Sometimes true    Within the past 12 months, the food you bought just didn't last and you didn't have money to get more.: Sometimes true  Transportation Needs: No Transportation Needs (03/12/2024)   Received from [facility] - Transportation    In the past 12 months, has lack of transportation kept you from medical appointments or from getting medications?: No    Lack of Transportation (Non-Medical): No  Physical Activity: Sufficiently Active (06/07/2023)   Exercise Vital Sign    Days of Exercise per Week: 3 days    Minutes of Exercise per Session: 60 min  Stress: No Stress Concern Present (06/07/2023)   [employer] of Occupational Health - Occupational Stress Questionnaire    Feeling of Stress : Only a little  Social Connections: Socially Isolated (06/07/2023)   Social Connection and Isolation Panel    Frequency of Communication with Friends and Family: More than three times a week    Frequency of Social Gatherings with Friends and Family: Once a week    Attends Religious Services: Never    Database administrator or Organizations: No    Attends Banker Meetings: Never    Marital Status: Widowed  Intimate Partner Violence: Not At Risk (06/07/2023)   Humiliation, Afraid, Rape, and Kick questionnaire    Fear of Current or Ex-Partner: No    Emotionally Abused: No    Physically Abused: No    Sexually Abused: No      Constitutional: Denies fever, malaise, fatigue, headache or abrupt weight changes.  HEENT: Denies eye pain, eye redness, ear pain, ringing in the ears, wax buildup, runny nose, nasal congestion, bloody nose, or sore throat. Respiratory: Pt reports chronic cough. Denies difficulty breathing, shortness of breath, or sputum production.   Cardiovascular:  Denies chest pain, chest tightness, palpitations or swelling in the hands or feet.  Gastrointestinal: Denies abdominal pain, bloating, constipation, diarrhea or blood in the stool.  GU: Denies urgency, frequency, pain with urination, burning sensation, blood in urine, odor or discharge. Musculoskeletal: Patient reports chronic joint pain, left-sided weakness.  Denies decrease in range of motion, difficulty with gait, muscle pain or joint swelling.  Skin: Denies redness, rashes, lesions or ulcercations.  Neurological: Patient reports insomnia, paresthesia of left lower extremity.  Denies dizziness, difficulty with memory, difficulty with speech or problems with balance and coordination.  Psych: Patient has a history of depression.  Denies anxiety, SI/HI.  No other specific complaints in a complete review of systems (except as listed in HPI above).  Objective:   Physical Exam BP 110/60 (BP Location: Right Arm, Patient Position: Sitting, Cuff Size: Normal)   Pulse (!) 106   Ht 5' 5 (1.651 m)   Wt 134 lb 12.8 oz (61.1 kg)   SpO2 95%   BMI 22.43 kg/m    Wt Readings from Last 3 Encounters:  11/29/23 127 lb (57.6 kg)  11/12/23 127 lb (57.6 kg)  09/26/23 127 lb (57.6 kg)    General: Appears her stated age, chronically ill appearing, in NAD. Skin: Warm, dry and intact. No ulcerations noted. HEENT: Head: normal shape and size; Eyes: sclera white, no icterus, conjunctiva pink, PERRLA and EOMs intact;  Neck:  Neck supple, trachea midline. No masses, lumps or thyromegaly present.  Cardiovascular: Tachycardic with normal rhythm. S1,S2 noted.   No murmur, rubs or gallops noted. No JVD or BLE edema. No carotid bruits noted. Pulmonary/Chest: Normal effort and diminished breath sounds. No respiratory distress. No wheezes, rales or ronchi noted.  Abdomen: Soft and nontender. Normal bowel sounds.  Musculoskeletal: Gait slow and steady with use of rolling walker. Neurological: Alert and oriented. Cranial nerves II-XII grossly intact. Coordination normal.  Psychiatric: Mood and affect normal. Behavior is normal. Judgment and thought content normal.    BMET    Component Value Date/Time   NA 140 11/26/2023 0818   NA 141 01/18/2016 1150   NA 138 09/01/2014 2130   K 3.9 11/26/2023 0818   K 3.5 09/01/2014 2130   CL 105 11/26/2023 0818   CL 104 09/01/2014 2130   CO2 27 11/26/2023 0818   CO2 28 09/01/2014 2130   GLUCOSE 95 11/26/2023 0818   GLUCOSE 287 (H) 09/01/2014 2130   BUN 13 11/26/2023 0818   BUN 12 01/18/2016 1150   BUN 14 09/01/2014 2130   CREATININE 0.72 11/26/2023 0818   CREATININE 0.90 04/03/2023 1123   CALCIUM  9.5 11/26/2023 0818   CALCIUM  8.9 09/01/2014 2130   GFRNONAA >60 11/26/2023 0818   GFRNONAA 70 06/14/2020 0929   GFRAA 82 06/14/2020 0929    Lipid Panel     Component Value Date/Time   CHOL 112 04/03/2023 1123   CHOL 177 06/07/2015 1215   CHOL 141 09/02/2014 0440   TRIG 92 04/03/2023 1123   TRIG 155 09/02/2014 0440   HDL 65 04/03/2023 1123   HDL 56 06/07/2015 1215   HDL 37 (L) 09/02/2014 0440   CHOLHDL 1.7 04/03/2023 1123   VLDL 16 10/17/2019 0606   VLDL 31 09/02/2014 0440   LDLCALC 29 04/03/2023 1123   LDLCALC 73 09/02/2014 0440    CBC    Component Value Date/Time   WBC 4.2 11/26/2023 0818   RBC 3.79 (L) 11/26/2023 0818   HGB 11.7 (L) 11/26/2023 0818   HGB 12.7  09/01/2014 2130   HCT 35.2 (L) 11/26/2023 0818   HCT 37.9 09/01/2014 2130   PLT 242 11/26/2023 0818   PLT 260 09/05/2014 0517   MCV 92.9 11/26/2023 0818   MCV 92 09/01/2014 2130   MCH 30.9 11/26/2023 0818   MCHC 33.2 11/26/2023  0818   RDW 13.5 11/26/2023 0818   RDW 13.9 09/01/2014 2130   LYMPHSABS 1.7 08/22/2022 1209   MONOABS 0.3 08/22/2022 1209   EOSABS 0.1 08/22/2022 1209   BASOSABS 0.0 08/22/2022 1209    Hgb A1C Lab Results  Component Value Date   HGBA1C 6.8 (H) 04/03/2023           Assessment & Plan:   Preventative health maintenance:  Encouraged her to get a flu shot in the fall She declines tetanus for financial reasons, advised her if she gets bit or cut to go get this done Pneumovax and Prevnar UTD Encouraged her to get her COVID booster Zostavax UTD Discussed Shingrix vaccine, she will check coverage with her insurance company and schedule visit if she would like to have this done CT lung cancer screening ordered She no longer wants to screen for cervical cancer She declines mammogram and bone density at this time She no longer wants to screen for colon cancer Encouraged her to consume a balanced diet and exercise regimen Advised her to see an eye doctor and dentist annually We will check CBC, c-Met, lipid, A1c and urine microalbumin today  RTC in 6 months, follow-up chronic conditions Angeline Laura, NP

## 2024-05-13 NOTE — Patient Instructions (Signed)
 Health Maintenance for Postmenopausal Women Menopause is a normal process in which your ability to get pregnant comes to an end. This process happens slowly over many months or years, usually between the ages of 76 and 38. Menopause is complete when you have missed your menstrual period for 12 months. It is important to talk with your health care provider about some of the most common conditions that affect women after menopause (postmenopausal women). These include heart disease, cancer, and bone loss (osteoporosis). Adopting a healthy lifestyle and getting preventive care can help to promote your health and wellness. The actions you take can also lower your chances of developing some of these common conditions. What are the signs and symptoms of menopause? During menopause, you may have the following symptoms: Hot flashes. These can be moderate or severe. Night sweats. Decrease in sex drive. Mood swings. Headaches. Tiredness (fatigue). Irritability. Memory problems. Problems falling asleep or staying asleep. Talk with your health care provider about treatment options for your symptoms. Do I need hormone replacement therapy? Hormone replacement therapy is effective in treating symptoms that are caused by menopause, such as hot flashes and night sweats. Hormone replacement carries certain risks, especially as you become older. If you are thinking about using estrogen or estrogen with progestin, discuss the benefits and risks with your health care provider. How can I reduce my risk for heart disease and stroke? The risk of heart disease, heart attack, and stroke increases as you age. One of the causes may be a change in the body's hormones during menopause. This can affect how your body uses dietary fats, triglycerides, and cholesterol. Heart attack and stroke are medical emergencies. There are many things that you can do to help prevent heart disease and stroke. Watch your blood pressure High  blood pressure causes heart disease and increases the risk of stroke. This is more likely to develop in people who have high blood pressure readings or are overweight. Have your blood pressure checked: Every 3-5 years if you are 32-23 years of age. Every year if you are 31 years old or older. Eat a healthy diet  Eat a diet that includes plenty of vegetables, fruits, low-fat dairy products, and lean protein. Do not eat a lot of foods that are high in solid fats, added sugars, or sodium. Get regular exercise Get regular exercise. This is one of the most important things you can do for your health. Most adults should: Try to exercise for at least 150 minutes each week. The exercise should increase your heart rate and make you sweat (moderate-intensity exercise). Try to do strengthening exercises at least twice each week. Do these in addition to the moderate-intensity exercise. Spend less time sitting. Even light physical activity can be beneficial. Other tips Work with your health care provider to achieve or maintain a healthy weight. Do not use any products that contain nicotine or tobacco. These products include cigarettes, chewing tobacco, and vaping devices, such as e-cigarettes. If you need help quitting, ask your health care provider. Know your numbers. Ask your health care provider to check your cholesterol and your blood sugar (glucose). Continue to have your blood tested as directed by your health care provider. Do I need screening for cancer? Depending on your health history and family history, you may need to have cancer screenings at different stages of your life. This may include screening for: Breast cancer. Cervical cancer. Lung cancer. Colorectal cancer. What is my risk for osteoporosis? After menopause, you may be  at increased risk for osteoporosis. Osteoporosis is a condition in which bone destruction happens more quickly than new bone creation. To help prevent osteoporosis or  the bone fractures that can happen because of osteoporosis, you may take the following actions: If you are 24-54 years old, get at least 1,000 mg of calcium and at least 600 international units (IU) of vitamin D  per day. If you are older than age 75 but younger than age 30, get at least 1,200 mg of calcium and at least 600 international units (IU) of vitamin D  per day. If you are older than age 8, get at least 1,200 mg of calcium and at least 800 international units (IU) of vitamin D  per day. Smoking and drinking excessive alcohol increase the risk of osteoporosis. Eat foods that are rich in calcium and vitamin D , and do weight-bearing exercises several times each week as directed by your health care provider. How does menopause affect my mental health? Depression may occur at any age, but it is more common as you become older. Common symptoms of depression include: Feeling depressed. Changes in sleep patterns. Changes in appetite or eating patterns. Feeling an overall lack of motivation or enjoyment of activities that you previously enjoyed. Frequent crying spells. Talk with your health care provider if you think that you are experiencing any of these symptoms. General instructions See your health care provider for regular wellness exams and vaccines. This may include: Scheduling regular health, dental, and eye exams. Getting and maintaining your vaccines. These include: Influenza vaccine. Get this vaccine each year before the flu season begins. Pneumonia vaccine. Shingles vaccine. Tetanus, diphtheria, and pertussis (Tdap) booster vaccine. Your health care provider may also recommend other immunizations. Tell your health care provider if you have ever been abused or do not feel safe at home. Summary Menopause is a normal process in which your ability to get pregnant comes to an end. This condition causes hot flashes, night sweats, decreased interest in sex, mood swings, headaches, or lack  of sleep. Treatment for this condition may include hormone replacement therapy. Take actions to keep yourself healthy, including exercising regularly, eating a healthy diet, watching your weight, and checking your blood pressure and blood sugar levels. Get screened for cancer and depression. Make sure that you are up to date with all your vaccines. This information is not intended to replace advice given to you by your health care provider. Make sure you discuss any questions you have with your health care provider. Document Revised: 01/24/2021 Document Reviewed: 01/24/2021 Elsevier Patient Education  2024 [name].

## 2024-05-14 ENCOUNTER — Ambulatory Visit: Payer: Self-pay | Admitting: Internal Medicine

## 2024-05-14 ENCOUNTER — Telehealth: Payer: Self-pay

## 2024-05-14 ENCOUNTER — Telehealth: Payer: Self-pay | Admitting: Urology

## 2024-05-14 LAB — COMPREHENSIVE METABOLIC PANEL WITH GFR
AG Ratio: 1.7 (calc) (ref 1.0–2.5)
ALT: 8 U/L (ref 6–29)
AST: 10 U/L (ref 10–35)
Albumin: 4.3 g/dL (ref 3.6–5.1)
Alkaline phosphatase (APISO): 92 U/L (ref 37–153)
BUN: 15 mg/dL (ref 7–25)
CO2: 29 mmol/L (ref 20–32)
Calcium: 9.6 mg/dL (ref 8.6–10.4)
Chloride: 107 mmol/L (ref 98–110)
Creat: 0.88 mg/dL (ref 0.60–1.00)
Globulin: 2.5 g/dL (ref 1.9–3.7)
Glucose, Bld: 189 mg/dL — ABNORMAL HIGH (ref 65–99)
Potassium: 4.6 mmol/L (ref 3.5–5.3)
Sodium: 141 mmol/L (ref 135–146)
Total Bilirubin: 0.4 mg/dL (ref 0.2–1.2)
Total Protein: 6.8 g/dL (ref 6.1–8.1)
eGFR: 68 mL/min/1.73m2 (ref >=60)

## 2024-05-14 LAB — CBC
HCT: 37.1 % (ref 35.0–45.0)
Hemoglobin: 12.3 g/dL (ref 11.7–15.5)
MCH: 30.5 pg (ref 27.0–33.0)
MCHC: 33.2 g/dL (ref 32.0–36.0)
MCV: 92.1 fL (ref 80.0–100.0)
MPV: 9.1 fL (ref 7.5–12.5)
Platelets: 237 Thousand/uL (ref 140–400)
RBC: 4.03 Million/uL (ref 3.80–5.10)
RDW: 13.1 % (ref 11.0–15.0)
WBC: 4.7 Thousand/uL (ref 3.8–10.8)

## 2024-05-14 LAB — HEMOGLOBIN A1C
Hgb A1c MFr Bld: 8.1 % — ABNORMAL HIGH (ref <5.7)
Mean Plasma Glucose: 186 mg/dL
eAG (mmol/L): 10.3 mmol/L

## 2024-05-14 LAB — LIPID PANEL
Cholesterol: 185 mg/dL (ref <200)
HDL: 52 mg/dL (ref >=50)
LDL Cholesterol (Calc): 108 mg/dL — ABNORMAL HIGH
Non-HDL Cholesterol (Calc): 133 mg/dL — ABNORMAL HIGH (ref <130)
Total CHOL/HDL Ratio: 3.6 (calc) (ref <5.0)
Triglycerides: 140 mg/dL (ref <150)

## 2024-05-14 LAB — MICROALBUMIN / CREATININE URINE RATIO
Creatinine, Urine: 250 mg/dL (ref 20–275)
Microalb Creat Ratio: 12 mg/g{creat} (ref <30)
Microalb, Ur: 2.9 mg/dL

## 2024-05-14 MED ORDER — METFORMIN HCL 500 MG PO TABS: ORAL_TABLET | ORAL | 0 refills | Status: DC

## 2024-05-14 MED ORDER — ATORVASTATIN CALCIUM 40 MG PO TABS: 40.0000 mg | ORAL_TABLET | Freq: Every evening | ORAL | 0 refills | Status: DC

## 2024-05-14 NOTE — Telephone Encounter (Signed)
 Patient called requesting a referral be sent to the cancer center for her. Patient would like to know if this can be done without her being seen at our office or does she require an appointment with us  first. Patient states she is not having any issues at the moment. Please advise.

## 2024-05-14 NOTE — Telephone Encounter (Signed)
 Copied from CRM [id number]. Topic: Referral - Question >> May 13, 2024  3:06 PM Kendra [name] wrote: Reason for CRM: patient is calling to ask if her provider could give her a referral to the cancer center  Phone number for the cancer center [phone]

## 2024-05-14 NOTE — Telephone Encounter (Signed)
 Patient notified to see urologist

## 2024-05-14 NOTE — Telephone Encounter (Signed)
 Please offer patient next available appointment as she has not been seen within the last year. Thanks!

## 2024-05-14 NOTE — Telephone Encounter (Signed)
 No, she needs to go back and see her urologist.  They were the ones treating her.

## 2024-05-14 NOTE — Telephone Encounter (Signed)
 Patient scheduled.

## 2024-05-15 ENCOUNTER — Other Ambulatory Visit: Payer: Self-pay | Admitting: Internal Medicine

## 2024-05-15 DIAGNOSIS — J449 Chronic obstructive pulmonary disease, unspecified: Secondary | ICD-10-CM

## 2024-05-16 ENCOUNTER — Ambulatory Visit: Payer: Self-pay

## 2024-05-16 ENCOUNTER — Ambulatory Visit: Payer: Self-pay | Admitting: Internal Medicine

## 2024-05-16 ENCOUNTER — Ambulatory Visit: Admitting: Family Medicine

## 2024-05-16 ENCOUNTER — Telehealth: Payer: Self-pay

## 2024-05-16 NOTE — Telephone Encounter (Signed)
 FYI Only or Action Required?: Action required by provider: update on patient condition.  Patient was last seen in primary care on 05/13/2024 by Antonette Angeline ORN, NP.  Called Nurse Triage reporting Diarrhea.  Symptoms began several days ago.  Triage Disposition: Go to ED Now (Notify PCP)  Patient/caregiver understands and will follow disposition?: No, wishes to speak with PCP      Copied from CRM #[id number]. Topic: Clinical - Pink Word Triage >> May 16, 2024  2:52 PM Tysheama G wrote: Reason for Triage: Patient stated she's been having diarrhea and  abdominal pain ever since she's been taking medication metFORMIN  (GLUCOPHAGE ) 500 MG tablet. Callback number is [phone] >> May 16, 2024  2:53 PM Tysheama G wrote: Patient stated she's been having abdominal pain and diarrhea ever since she's been taking her medication metFORMIN  (GLUCOPHAGE ) 500 MG tablet. Callback number is [phone] Reason for Disposition  [1] SEVERE abdominal pain (e.g., excruciating) AND [2] present > 1 hour  Answer Assessment - Initial Assessment Questions Pt started metformin  on 8/27 and thinks this could be causing the below symptoms. This RN recommends pt goes to ED based off 9/10 constant abdomen pain. Pt states she does not have transportation and this RN offered to call pt an ambulance.Pt refused and states if her symptoms get worse she will go.This RN notified [facility].   DIARRHEA SEVERITY: How bad is the diarrhea? How many more stools have you had in the past 24 hours than normal?      3-4 times  ONSET: When did the diarrhea begin?      8/27  STOOL DESCRIPTION:  How loose or watery is the diarrhea? What is the stool color? Is there any blood or mucous in the stool?     Loose; denies blood or mucous  VOMITING: Are you also vomiting? If Yes, ask: How many times in the past 24 hours?      Denies  ABDOMEN PAIN SEVERITY: If present, ask: How bad is the pain?  (e.g., Scale 1-10; mild, moderate,  or severe)     9/10 pain level constant  HYDRATION: Any signs of dehydration? (e.g., dry mouth [not just dry lips], too weak to stand, dizziness, new weight loss) When did you last urinate?     Feels weak when stand; pt states this has been ongoing since before medication and she told doctor this  Protocols used: [facility]

## 2024-05-16 NOTE — Telephone Encounter (Signed)
 Copied from CRM [id number]. Topic: Clinical - Medication Question >> May 16, 2024  2:55 PM Tysheama G wrote: Reason for CRM: Patient stated she's been having  abdominal pain and diarrhea since Dr. Amedeo her on medication metFORMIN  (GLUCOPHAGE ) 500 MG tablet. Callback number is [phone]

## 2024-05-16 NOTE — Telephone Encounter (Signed)
 See previous message

## 2024-05-16 NOTE — Telephone Encounter (Signed)
 Spoke with patient she stated she is taking metformin  when she eats. I asked how many times a day are you taking the metformin  she stated once.  Advised as we discussed she needs to give her body time to adjust  to the medication and not to come in to the appointment this afternoon. Is there any further instructions?

## 2024-05-16 NOTE — Telephone Encounter (Signed)
 No further recommendations time.  She can call back next week if symptoms persist or worsen

## 2024-05-16 NOTE — Telephone Encounter (Signed)
 She should currently only be taking 1 pill/day.  Is this how she has been taking it?

## 2024-05-16 NOTE — Telephone Encounter (Signed)
 Requested Prescriptions  Pending Prescriptions Disp Refills   albuterol  (VENTOLIN  HFA) 108 (90 Base) MCG/ACT inhaler [Pharmacy Med Name: ALBUTEROL  HFA *PROA* 108 (90 BAS Aerosol] 8.5 g 5    Sig: INHALE 1-2 PUFFS BY MOUTH EVERY 6 HOURS AS NEEDED     Pulmonology:  Beta Agonists 2 Passed - 05/16/2024  5:53 PM      Passed - Last BP in normal range    BP Readings from Last 1 Encounters:  05/13/24 110/60         Passed - Last Heart Rate in normal range    Pulse Readings from Last 1 Encounters:  05/13/24 (!) 106         Passed - Valid encounter within last 12 months    Recent Outpatient Visits           3 days ago Encounter for general adult medical examination with abnormal findings   [facility] [place], Angeline ORN, NP       Future Appointments             In 1 month Francisca, Redell BROCKS, MD [facility] [address]

## 2024-05-16 NOTE — Telephone Encounter (Addendum)
 Same day visit scheduled. FYI Only or Action Required?: FYI only for provider.  Patient was last seen in primary care on 05/13/2024 by Antonette Angeline ORN, NP.  Called Nurse Triage reporting diarrhea.  Symptoms began several days ago.  Interventions attempted: Nothing.  Symptoms are: gradually worsening.  Triage Disposition: See Today In Office  Patient/caregiver understands and will follow disposition?: Yes  FYI Only or Action Required?: FYI  Patient was last seen in primary care on 05/13/2024 by Antonette Angeline ORN, NP.  Called Nurse Triage reporting Diarrhea.  Symptoms began several days ago.  Interventions attempted: Nothing.  Symptoms are: gradually worsening.  Triage Disposition: Discuss With PCP and Callback by Nurse Today (overriding See Physician Within 24 Hours)  Patient/caregiver understands and will follow disposition?: No, wishes to speak with PCP  Reason for Disposition  [1] MODERATE diarrhea (e.g., 4-6 times / day more than normal) AND [2] age > 70 years  [1] MODERATE diarrhea (e.g., 4-6 times / day more than normal) AND [2] present > 48 hours (2 days)  Answer Assessment - Initial Assessment Questions 1. DIARRHEA SEVERITY: How bad is the diarrhea? How many more stools have you had in the past 24 hours than normal?      3+  2. ONSET: When did the diarrhea begin?      8/27 after starting metformin   3. STOOL DESCRIPTION:  How loose or watery is the diarrhea? What is the stool color? Is there any blood or mucous in the stool?     Brown  4. VOMITING: Are you also vomiting? If Yes, ask: How many times in the past 24 hours?      Denies  5. ABDOMEN PAIN: Are you having any abdomen pain? If Yes, ask: What does it feel like? (e.g., crampy, dull, intermittent, constant)      Yes, mid upper  10. ANTIBIOTIC USE: Are you taking antibiotics now or have you taken antibiotics in the past 2 months?       No, last listed antibiotic is 4 months ago  11.  OTHER SYMPTOMS: Do you have any other symptoms? (e.g., fever, blood in stool)       Nausea  Protocols used: Diarrhea-A-AH Message from [place] G sent at 05/16/2024  2:53 PM EDT  Patient stated she's been having abdominal pain and diarrhea ever since she's been taking her medication metFORMIN  (GLUCOPHAGE ) 500 MG tablet. Callback number is [phone]

## 2024-05-20 ENCOUNTER — Encounter: Payer: Self-pay | Admitting: Oncology

## 2024-05-22 ENCOUNTER — Other Ambulatory Visit: Payer: Self-pay | Admitting: Surgery

## 2024-05-26 ENCOUNTER — Telehealth: Payer: Self-pay

## 2024-05-26 ENCOUNTER — Telehealth: Payer: Self-pay | Admitting: Internal Medicine

## 2024-05-26 NOTE — Telephone Encounter (Unsigned)
 Copied from CRM [id number]. Topic: Clinical - Medication Question >> May 26, 2024  1:24 PM Montie POUR wrote: Reason for CRM:  Kendra [name] would like medication to quit smoking. She would like the pill and gum. Please call her at [phone] to discuss.

## 2024-05-26 NOTE — Telephone Encounter (Signed)
 Spoke to patient and advised per [place].

## 2024-05-26 NOTE — Telephone Encounter (Signed)
 Copied from CRM [id number]. Topic: General - Other >> May 26, 2024  1:59 PM Yolanda T wrote: Patient called back to see if she could speak with Angeline Laura. Advise the message was taken and provider would call back once she get an opportunity to do so

## 2024-05-26 NOTE — Telephone Encounter (Signed)
 Copied from CRM [id number]. Topic: Clinical - Order For Equipment >> May 26, 2024  1:23 PM Montie POUR wrote: She saw NP [name] on 05/13/24 and discuss about getting her a new lift chair. No one has contacted Ceylin about the lift chair. Please call [place] at [phone] to discuss the order for the lift chair.

## 2024-05-26 NOTE — Telephone Encounter (Signed)
 Patient advised per Angeline. She states she will give us  a call back to schedule once she checks her schedule.

## 2024-05-26 NOTE — Telephone Encounter (Signed)
 She did not mention this at her appt. Medicare requires a face to face encounter with specific documentation in order to approve a lift chair. She will need to schedule an appt for this.

## 2024-05-27 ENCOUNTER — Ambulatory Visit: Admitting: Podiatry

## 2024-05-27 MED ORDER — NICOTINE POLACRILEX 4 MG MT GUM: 4.0000 mg | CHEWING_GUM | OROMUCOSAL | 0 refills | Status: DC | PRN

## 2024-05-27 NOTE — Addendum Note (Signed)
 Addended by: ANTONETTE ANGELINE ORN on: 05/27/2024 10:42 AM   Modules accepted: Orders

## 2024-05-27 NOTE — Telephone Encounter (Signed)
 Patient advised and verbalized understanding

## 2024-05-27 NOTE — Telephone Encounter (Signed)
 I will send in the gum however if she wants Chantix, we would have a discussion either virtually or in person about this.

## 2024-05-28 ENCOUNTER — Encounter
Admission: RE | Admit: 2024-05-28 | Discharge: 2024-05-28 | Disposition: A | Discharge status: Home / Self Care | Source: Ambulatory Visit | Attending: Surgery | Admitting: Surgery

## 2024-05-28 ENCOUNTER — Other Ambulatory Visit: Payer: Self-pay

## 2024-05-28 VITALS — Ht 65.0 in | Wt 123.0 lb

## 2024-05-28 DIAGNOSIS — E118 Type 2 diabetes mellitus with unspecified complications: Secondary | ICD-10-CM

## 2024-05-28 NOTE — Patient Instructions (Addendum)
 Your procedure is scheduled on: Wednesday 06/04/24 Report to the Registration Desk on the 1st floor of the [facility]. To find out your arrival time, please call [phone] between 1PM - 3PM on: Tuesday 06/03/24 If your arrival time is 6:00 am, do not arrive before that time as the [facility] entrance doors do not open until 6:00 am.  REMEMBER: Instructions that are not followed completely may result in serious medical risk, up to and including death; or upon the discretion of your surgeon and anesthesiologist your surgery may need to be rescheduled.  Do not eat food after midnight the night before surgery.  No gum chewing or hard candies.  You may however, drink CLEAR liquids up to 2 hours before you are scheduled to arrive for your surgery. Do not drink anything within 2 hours of your scheduled arrival time.  Clear liquids include: - water  Do NOT drink anything that is not on this list.   In addition, your doctor has ordered for you to drink the provided:  Gatorade G2 Drinking this carbohydrate drink up to two hours before surgery helps to reduce insulin  resistance and improve patient outcomes. Please complete drinking 2 hours before scheduled arrival time.  One week prior to surgery: Stop Anti-inflammatories (NSAIDS) such as Advil , Aleve , Ibuprofen , Motrin , Naproxen , Naprosyn  and Aspirin  based products such as Excedrin, Goody's Powder, BC Powder. Stop ANY OVER THE COUNTER supplements until after surgery.   Stop metFORMIN  (GLUCOPHAGE ) 500 MG 2 days prior to surgery (take last dose Sunday 06/01/24)  You may however, continue to take Tylenol  if needed for pain up until the day of surgery.   Continue taking all of your other prescription medications up until the day of surgery.  ON THE DAY OF SURGERY ONLY TAKE THESE MEDICATIONS WITH SIPS OF WATER:  None   Use inhalers on the day of surgery and bring to the hospital. albuterol  (VENTOLIN  HFA) 108 (90 Base) MCG/ACT inhaler   Fluticasone -Umeclidin-Vilant (TRELEGY ELLIPTA ) 100-62.5-25 MCG/ACT AEPB   No Alcohol for 24 hours before or after surgery.  No Smoking including e-cigarettes for 24 hours before surgery.  No chewable tobacco products for at least 6 hours before surgery.  No nicotine  patches on the day of surgery.  Do not use any recreational drugs for at least a week (preferably 2 weeks) before your surgery.  Please be advised that the combination of cocaine and anesthesia may have negative outcomes, up to and including death. If you test positive for cocaine, your surgery will be cancelled.  On the morning of surgery brush your teeth with toothpaste and water, you may rinse your mouth with mouthwash if you wish. Do not swallow any toothpaste or mouthwash.  Use CHG Soap or wipes as directed on instruction sheet.  Do not wear jewelry, make-up, hairpins, clips or nail polish.  For welded (permanent) jewelry: bracelets, anklets, waist bands, etc.  Please have this removed prior to surgery.  If it is not removed, there is a chance that hospital personnel will need to cut it off on the day of surgery.  Do not wear lotions, powders, or perfumes.   Do not shave body hair from the neck down 48 hours before surgery.  Contact lenses, hearing aids and dentures may not be worn into surgery.  Do not bring valuables to the hospital. [facility] is not responsible for any missing/lost belongings or valuables.   Notify your doctor if there is any change in your medical condition (cold, fever, infection).  Wear comfortable clothing (specific to your surgery type) to the hospital.  After surgery, you can help prevent lung complications by doing breathing exercises.  Take deep breaths and cough every 1-2 hours. Your doctor may order a device called an Incentive Spirometer to help you take deep breaths. When coughing or sneezing, hold a pillow firmly against your incision with both hands. This is called  "splinting." Doing this helps protect your incision. It also decreases belly discomfort.  If you are being admitted to the hospital overnight, leave your suitcase in the car. After surgery it may be brought to your room.  In case of increased patient census, it may be necessary for you, the patient, to continue your postoperative care in the Same Day Surgery department.  If you are being discharged the day of surgery, you will not be allowed to drive home. You will need a responsible individual to drive you home and stay with you for 24 hours after surgery.   If you are taking public transportation, you will need to have a responsible individual with you.  Please call the Pre-admissions Testing Dept. at [phone] if you have any questions about these instructions.  Surgery Visitation Policy:  Patients having surgery or a procedure may have two visitors.  Children under the age of 23 must have an adult with them who is not the patient.  Inpatient Visitation:    Visiting hours are 7 a.m. to 8 p.m. Up to four visitors are allowed at one time in a patient room. The visitors may rotate out with other people during the day.  One visitor age 66 or older may stay with the patient overnight and must be in the room by 8 p.m.   Merchandiser, retail to address health-related social needs:  [email or web address]                                                                                                            Preparing for Surgery with CHLORHEXIDINE  GLUCONATE (CHG) Soap  Chlorhexidine  Gluconate (CHG) Soap  o An antiseptic cleaner that kills germs and bonds with the skin to continue killing germs even after washing  o Used for showering the night before surgery and morning of surgery  Before surgery, you can play an important role by reducing the number of germs on your skin.  CHG (Chlorhexidine  gluconate) soap is an antiseptic cleanser which kills germs and  bonds with the skin to continue killing germs even after washing.  Please do not use if you have an allergy to CHG or antibacterial soaps. If your skin becomes reddened/irritated stop using the CHG.  1. Shower the NIGHT BEFORE SURGERY and the MORNING OF SURGERY with CHG soap.  2. If you choose to wash your hair, wash your hair first as usual with your normal shampoo.  3. After shampooing, rinse your hair and body thoroughly to remove the shampoo.  4. Use CHG as you would any other liquid soap. You can apply CHG directly to the skin and wash gently with a  scrungie or a clean washcloth.  5. Apply the CHG soap to your body only from the neck down. Do not use on open wounds or open sores. Avoid contact with your eyes, ears, mouth, and genitals (private parts). Wash face and genitals (private parts) with your normal soap.  6. Wash thoroughly, paying special attention to the area where your surgery will be performed.  7. Thoroughly rinse your body with warm water.  8. Do not shower/wash with your normal soap after using and rinsing off the CHG soap.  9. Pat yourself dry with a clean towel.  10. Wear clean pajamas to bed the night before surgery.  12. Place clean sheets on your bed the night of your first shower and do not sleep with pets.  13. Shower again with the CHG soap on the day of surgery prior to arriving at the hospital.  14. Do not apply any deodorants/lotions/powders.  15. Please wear clean clothes to the hospital.

## 2024-06-03 ENCOUNTER — Ambulatory Visit: Admitting: Podiatry

## 2024-06-04 ENCOUNTER — Encounter: Admission: RE | Payer: Self-pay | Source: Home / Self Care

## 2024-06-04 ENCOUNTER — Ambulatory Visit: Admission: RE | Admit: 2024-06-04 | Source: Home / Self Care | Admitting: Surgery

## 2024-06-04 SURGERY — RELEASE, CARPAL TUNNEL, ENDOSCOPIC
Anesthesia: Choice | Site: Wrist | Laterality: Right

## 2024-06-04 NOTE — H&P (Signed)
 History of Present Illness:  Kendra [name] is a 78 y.o. female who presents today for repeat evaluation status post a right reverse total shoulder arthroplasty performed in July 2024. The patient is now close to 59-month status post surgery. The patient is taking occasional Celebrex and Tylenol  as needed for discomfort. The patient does have a history of known carpal tunnel syndrome involving the right wrist, she has been scheduled to undergo a carpal tunnel release by neurosurgery at 1 time but has not yet undergone this procedure. The patient has not suffered any falls or trauma affecting the right shoulder since her last evaluation. She reports mild discomfort in the right shoulder primarily along the incision site but has not noticed any loss of range of motion. She does state that sometimes the cold weather and rain does make the shoulder feel more achy. She denies any signs of infection to the right shoulder such as fevers or chills. Her primary concern is the ongoing burning and tingling that she is experiencing in her right hand, she does report that the sharp pain will occasionally wake her up at night and she feels that she has to shake out her right hand. The patient does have severe carpal tunnel documented via a nerve conduction study. The patient does have a history of asthma in addition to diabetes. No previous history of DVT. The patient denies any personal history of heart attack or stroke.  Current Outpatient Medications:  ACCU-CHEK SOFTCLIX LANCETS lancets  acetaminophen  (TYLENOL ) 650 MG ER tablet Take 2 capsules by mouth in the morning, at noon, in the evening, and at bedtime.  albuterol  (VENTOLIN  HFA) 90 mcg/actuation inhaler Inhale 2 inhalations into the lungs INHALE 1-2 PUFFS BY MOUTH EVERY 6 HOURS AS NEEDED WHEEZING/ SHORTNESS OF BREATH  aspirin  81 MG EC tablet Take by mouth Take 1 tablet (81 mg total) by mouth daily. Swallow whole.  atorvastatin  (LIPITOR) 40 MG tablet TAKE 1  TABLET BY MOUTH BEDTIME  buPROPion  (WELLBUTRIN  XL) 150 MG XL tablet Take 1 tablet by mouth once daily  celecoxib (CELEBREX) 200 MG capsule Take 1 capsule (200 mg total) by mouth once daily 60 capsule 1  clopidogreL  (PLAVIX ) 75 mg tablet Take by mouth Take 1 tablet (75 mg total) by mouth daily.  cyclobenzaprine  (FLEXERIL ) 5 MG tablet Take 5 mg by mouth 3 (three) times daily as needed  diazePAM (VALIUM) 5 MG tablet Take 1 tablet 30 minutes prior to MRI scan as needed for anxiety 1 tablet 0  fluticasone -umeclidinium-vilanterol (TRELEGY ELLIPTA ) 100-62.5-25 mcg inhaler USE ONE INHALATION INTO THE LUNGS ONCE A DAY RINSE MOUTH AFTER EACH USE  gabapentin  (NEURONTIN ) 100 MG capsule Take 1 capsule (100 mg total) by mouth 2 (two) times daily 60 capsule 0  ibuprofen  (MOTRIN ) 600 MG tablet  ketoconazole (NIZORAL) 2 % cream  lisinopriL  (ZESTRIL ) 5 MG tablet  metFORMIN  (GLUCOPHAGE ) 500 MG tablet  methocarbamoL  (ROBAXIN ) 500 MG tablet Take 1 tablet (500 mg total) by mouth 3 (three) times daily 30 tablet 2  nicotine  (NICOTROL ) 10 mg cartridge for inhalation  ondansetron  (ZOFRAN ) 4 MG tablet  oxyCODONE  (ROXICODONE ) 5 MG immediate release tablet Take 1 tablet (5 mg total) by mouth every 4 (four) hours as needed 30 tablet 0  pravastatin (PRAVACHOL) 20 MG tablet Take 1 tablet by mouth once daily  silver  sulfADIAZINE  (SSD) 1 % cream Apply topically  SITagliptin  phos-metFORMIN  (JANUMET  XR) 100-1,000 mg ER 24 hr multiphase tablet  SITagliptin  phos-metFORMIN  (JANUMET  XR) 50-1,000 mg ER 24 hr mutliphase  tablet  traMADoL  (ULTRAM ) 50 mg tablet Take 1 tablet by mouth every 6 (six) hours as needed  traZODone  (DESYREL ) 50 MG tablet Take 50 mg by mouth at bedtime  umeclidinium (INCRUSE ELLIPTA ) 62.5 mcg/actuation DsDv inhalation unit  vancomycin (VANCOCIN) 250 MG capsule  zolpidem  (AMBIEN ) 5 MG tablet   Allergies:  Citalopram  Nausea And Vomiting   Past Medical History:  Asthma without status asthmaticus (HHS-HCC)   Cataract cortical, senile  COPD (chronic obstructive pulmonary disease) (CMS/HHS-HCC)  Depression  Diabetes mellitus type 2, uncomplicated (CMS/HHS-HCC)  Glaucoma (increased eye pressure)  History of stroke  2015 and 2017- weakness in the left leg  Hyperlipidemia  Hypertension  Neuropathy  Pneumonia 2018  Rheumatoid arthritis (CMS/HHS-HCC)   Past Surgical History:  ACDF 12/15/2008 (C5-6, C6-7. Diskectomy at C5-6 and C6-7. Insertion of interbody device BAK-C at C5-6. Insertion of interbody device BAK-C at C6-7)  ENDARTERECTOMY CAROTID RIGHT. Patch Angioplasty Using XenoSure Biologic Patch Right Carotid. Right 10/21/2019  Surgeon: Sheree Penne Bruckner, MD  LEFT CAROTID ENDARTERECTOMY. Patch Angioplasty Left Carotid. Left 12/30/2019 (Surgeon: Sheree Penne Bruckner MD)  Reverse right total shoulder arthroplasty with biceps tenodesis Right 04/10/2023 (Dr. Edie)  BREAST EXCISIONAL BIOPSY Right (Fatty Tumor)  LEFT WRIST SURGERY  RIGHT RING FINGER   Family History:  Hyperlipidemia (Elevated cholesterol) Mother  Diabetes Mother  High blood pressure (Hypertension) Mother  Cataracts Mother  Breast cancer Mother  Diabetes type II Mother  Depression Father  Stroke Father  Heart murmur Son   Social History:   Socioeconomic History:  Marital status: Widowed  Tobacco Use  Smoking status: Every Day  Current packs/day: 0.25  Types: Cigarettes  Smokeless tobacco: Never  Vaping Use  Vaping status: Never Used  Substance and Sexual Activity  Alcohol use: No  Drug use: No  Sexual activity: Defer   Social Drivers of Health:   Physicist, medical Strain: Low Risk (03/12/2024)  Overall Financial Resource Strain (CARDIA)  Difficulty of Paying Living Expenses: Not hard at all  Food Insecurity: Food Insecurity Present (03/12/2024)  Hunger Vital Sign  Worried About Running Out of Food in the Last Year: Sometimes true  Ran Out of Food in the Last Year: Sometimes true   Transportation Needs: No Transportation Needs (03/12/2024)  PRAPARE - Risk analyst (Medical): No  Lack of Transportation (Non-Medical): No   Review of Systems:  A comprehensive 14 point ROS was performed, reviewed, and the pertinent orthopaedic findings are documented in the HPI.  Physical Exam: BP 102/76  Ht 152.4 cm (5')  Wt 61.5 kg (135 lb 9.6 oz)  BMI 26.48 kg/m  General/Constitutional: The patient appears to be well-nourished, well-developed, and in no acute distress. Neuro/Psych: Normal mood and affect, oriented to person, place and time. Eyes: Non-icteric. Pupils are equal, round, and reactive to light, and exhibit synchronous movement. ENT: Unremarkable. Lymphatic: No palpable adenopathy. Respiratory: Moderate wheeze, Normal chest excursion, and Non-labored breathing Cardiovascular: Regular rate and rhythm. No murmurs. and No edema, swelling or tenderness, except as noted in detailed exam. Integumentary: No impressive skin lesions present, except as noted in detailed exam. Musculoskeletal: Unremarkable, except as noted in detailed exam.  Right shoulder exam: Skin inspection of the right shoulder demonstrates her surgical incision to be well-healed and without evidence for infection. No swelling, erythema, ecchymosis, abrasions, or other skin abnormalities are identified. She does have some discomfort palpation along the surgical incision site. Actively, she is able to reach her ipsilateral ear and the top of her head, as well  as across to the opposite shoulder, and she is able to reach behind her neck. She is able to internally rotate to the level of her right sacroiliac joint at today's visit. She denies any pain with range of motion of the right shoulder at today's appointment. She exhibits 4/5 strength with resisted internal and external rotation, as well as with resisted abduction. The patient does have some underlying thenar muscle wasting noted to the  right hand due to her underlying carpal tunnel syndrome. Positive Tinel's test and positive Phalen's test at the right wrist.  X-rays/MRI/Lab data:  AP, Y scapular and axillary views of the right shoulder were obtained in a previous visit and reviewed by me. These x-rays demonstrate the patient is status post a right reverse total shoulder arthroplasty. The hardware is in excellent position without any evidence of loosening. There is no acute fracture identified. No evidence of dislocation identified.  Assessment: Type 2 diabetes mellitus with peripheral neuropathy (CMS/HHS-HCC)  Status post reverse total shoulder replacement, right Yes  Primary osteoarthritis of right shoulder  Right carpal tunnel syndrome   Plan: 1. Treatment options were discussed today with the patient. 2. Pertaining to the right shoulder she feels that she is doing well at today's visit. She does have excellent range of motion without any mechanical symptoms. She denies any significant pain in the right shoulder. 3. She may continue to perform activities as tolerated to the right upper extremity. Continue with home exercises. She will follow-up with me on an as-needed basis for the right shoulder. 4. The patient does have documented severe right carpal tunnel syndrome of the right wrist. The patient is quite frustrated by her continued right wrist discomfort and would like to discuss more aggressive treatment options at this time. 5. The risk and benefits of a endoscopic right carpal tunnel release procedure were discussed today in clinic. I did inform her that neurosurgery has been working her up for this procedure however she would like for this to be performed by Dr. Edie at this time. 5. This document will serve as a surgical history and physical for the patient. The patient will follow-up 2 weeks after surgery for suture removal. 6. She can contact the clinic if she has any questions, new symptoms develop or symptoms  worsen.  The procedure was discussed with the patient, as were the potential risks (including bleeding, infection, nerve and/or blood vessel injury, persistent or recurrent pain, failure of the release, continued weakness, need for further surgery, blood clots, strokes, heart attacks and/or arhythmias, pneumonia, etc.) and benefits. The patient states her understanding and wishes to proceed.    H&P reviewed and patient re-examined. No changes.

## 2024-06-05 ENCOUNTER — Telehealth: Payer: Self-pay | Admitting: Acute Care

## 2024-06-05 DIAGNOSIS — Z122 Encounter for screening for malignant neoplasm of respiratory organs: Secondary | ICD-10-CM

## 2024-06-05 DIAGNOSIS — F17210 Nicotine dependence, cigarettes, uncomplicated: Secondary | ICD-10-CM

## 2024-06-05 DIAGNOSIS — Z87891 Personal history of nicotine dependence: Secondary | ICD-10-CM

## 2024-06-05 NOTE — Telephone Encounter (Signed)
 Lung Cancer Screening Narrative/Criteria Questionnaire (Cigarette Smokers Only- No Cigars/Pipes/vapes)   Kendra [name]   SDMV:06/16/24 at 11a/[facility]                                           09/21/1945               LDCT: 06/17/24 at 1pm/[facility]    78 y.o.   Phone: [phone]  Lung Screening Narrative (confirm age 21-77 yrs Medicare / 50-80 yrs Private pay insurance)   Insurance information:[employer]   Referring Provider:[name]   This screening involves an initial phone call with a team member from our program. It is called a shared decision making visit. The initial meeting is required by insurance and Medicare to make sure you understand the program. This appointment takes about 15-20 minutes to complete. The CT scan will completed at a separate date/time. This scan takes about 5-10 minutes to complete and you may eat and drink before and after the scan.  Criteria questions for Lung Cancer Screening:   Are you a current or former smoker? Current Age began smoking: 15y   If you are a former smoker, what year did you quit smoking? NA   To calculate your smoking history, I need an accurate estimate of how many packs of cigarettes you smoked per day and for how many years. (Not just the number of PPD you are now smoking)   Years smoking 63 x Packs per day 3/4 to 1 = Pack years 55   (at least 20 pack yrs)   (Make sure they understand that we need to know how much they have smoked in the past, not just the number of PPD they are smoking now)  Do you have a personal history of cancer?  No    Do you have a family history of cancer? No - states her family never talked about things  Are you coughing up blood?  No  Have you had unexplained weight loss of 15 lbs or more in the last 6 months? No *pt states she lost from 200lb to 123lb in 2 months from insuline adjust but documented wts are:            Jan 2025 =127lbs   June = 133 lbs   Aug = 134 lbs  Sept = 123 lbs  It looks like you meet all  criteria.     Additional information: NA

## 2024-06-10 ENCOUNTER — Ambulatory Visit: Admitting: Podiatry

## 2024-06-13 ENCOUNTER — Ambulatory Visit: Payer: 59

## 2024-06-16 ENCOUNTER — Encounter: Payer: Self-pay | Admitting: Adult Health

## 2024-06-16 ENCOUNTER — Ambulatory Visit (INDEPENDENT_AMBULATORY_CARE_PROVIDER_SITE_OTHER): Admitting: Adult Health

## 2024-06-16 DIAGNOSIS — Z122 Encounter for screening for malignant neoplasm of respiratory organs: Secondary | ICD-10-CM

## 2024-06-16 DIAGNOSIS — F17210 Nicotine dependence, cigarettes, uncomplicated: Secondary | ICD-10-CM | POA: Diagnosis not present

## 2024-06-16 NOTE — Progress Notes (Signed)
  Virtual Visit via Telephone Note  I connected with Kendra [name] , 06/16/24 11:03 AM by a telemedicine application and verified that I am speaking with the correct person using two identifiers.  Location: Patient: home Provider: home   I discussed the limitations of evaluation and management by telemedicine and the availability of in person appointments. The patient expressed understanding and agreed to proceed.   Shared Decision Making Visit Lung Cancer Screening Program [phone])   Eligibility: 78 y.o. Pack Years Smoking History Calculation = 55 pack years (# packs/per year x # years smoked) Recent History of coughing up blood  no Unexplained weight loss? no ( >Than 15 pounds within the last 6 months ) Prior History Lung / other cancer no (Diagnosis within the last 5 years already requiring surveillance chest CT Scans). Smoking Status Current Smoker  Visit Components: Discussion included one or more decision making aids. YES Discussion included risk/benefits of screening. YES Discussion included potential follow up diagnostic testing for abnormal scans. YES Discussion included meaning and risk of over diagnosis. YES Discussion included meaning and risk of False Positives. YES Discussion included meaning of total radiation exposure. YES  Counseling Included: Importance of adherence to annual lung cancer LDCT screening. YES Impact of comorbidities on ability to participate in the program. YES Ability and willingness to under diagnostic treatment. YES  Smoking Cessation Counseling: Current Smokers:  Discussed importance of smoking cessation. yes Information about tobacco cessation classes and interventions provided to patient. yes Patient provided with ticket for LDCT Scan. yes Symptomatic Patient. NO Diagnosis Code: Tobacco Use Z72.0 Asymptomatic Patient yes  Counseling - 4 minutes of smoking cessation counseling  (CT Chest Lung Cancer Screening Low Dose W/O CM)  [id number]  Z12.2-Screening of respiratory organs Z87.891-Personal history of nicotine  dependence   Kendra [name] 06/16/24

## 2024-06-16 NOTE — Patient Instructions (Signed)

## 2024-06-17 ENCOUNTER — Ambulatory Visit: Admission: RE | Admit: 2024-06-17 | Source: Ambulatory Visit

## 2024-06-20 ENCOUNTER — Ambulatory Visit: Admitting: Podiatry

## 2024-06-24 ENCOUNTER — Encounter: Payer: Self-pay | Admitting: Podiatry

## 2024-06-24 ENCOUNTER — Ambulatory Visit (INDEPENDENT_AMBULATORY_CARE_PROVIDER_SITE_OTHER): Admitting: Podiatry

## 2024-06-24 VITALS — Ht 65.0 in | Wt 123.0 lb

## 2024-06-24 DIAGNOSIS — M79675 Pain in left toe(s): Secondary | ICD-10-CM | POA: Diagnosis not present

## 2024-06-24 DIAGNOSIS — B351 Tinea unguium: Secondary | ICD-10-CM

## 2024-06-24 DIAGNOSIS — M79674 Pain in right toe(s): Secondary | ICD-10-CM

## 2024-06-24 NOTE — Progress Notes (Signed)
   Chief Complaint  Patient presents with   Nail Problem    Pt is here for [facility] also complains of possible ingrown on the right 4th or 5th toe, she is not sure which one.    SUBJECTIVE Patient with a history of diabetes mellitus presents to office today complaining of elongated, thickened nails that cause pain while ambulating in shoes.  Patient is unable to trim their own nails. Patient is here for further evaluation and treatment.  Past Medical History:  Diagnosis Date   Aortic atherosclerosis    Asthma    Back pain    Bladder tumor    Carotid artery stenosis    Carpal tunnel syndrome of right wrist    Cataract    Cervical disc disorder    COPD (chronic obstructive pulmonary disease) (HCC)    Depression    Diabetic neuropathy (HCC)    DM (diabetes mellitus), type 2 (HCC)    Dyspnea    with exertion   GERD (gastroesophageal reflux disease)    Hematuria    Hemiparesis affecting left side as late effect of cerebrovascular accident (CVA) (HCC)    History of kidney stones    per patient can't remember the year   Hyperlipidemia    Hypertension    per patient take meds for it   IDA (iron  deficiency anemia)    Insomnia    Neuropathy    Osteoarthritis    Osteopenia    Pneumonia 2018   per patient   Reflux    Rheumatoid arthritis (HCC)    Stroke (HCC) 2015   and again 2017  - Weakness in left leg, staggers w/ walking, vision    Tenosynovitis    Urothelial carcinoma of bladder (HCC)    Vitamin B 12 deficiency     Allergies  Allergen Reactions   Metformin  Hcl Er Diarrhea   Citalopram  Nausea Only     OBJECTIVE General Patient is awake, alert, and oriented x 3 and in no acute distress. Derm Skin is dry and supple bilateral. Negative open lesions or macerations. Remaining integument unremarkable. Nails are tender, long, thickened and dystrophic with subungual debris, consistent with onychomycosis, 1-5 bilateral. No signs of infection noted. Vasc  DP and PT pedal  pulses palpable bilaterally. Temperature gradient within normal limits.  Neuro Epicritic and protective threshold sensation diminished bilaterally.  Musculoskeletal Exam No symptomatic pedal deformities noted bilateral. Muscular strength within normal limits.  ASSESSMENT 1. Diabetes Mellitus w/ peripheral neuropathy 2.  Pain due to onychomycosis of toenails bilateral  PLAN OF CARE 1. Patient evaluated today. 2. Instructed to maintain good pedal hygiene and foot care. Stressed importance of controlling blood sugar.  3. Mechanical debridement of nails 1-5 bilaterally performed using a nail nipper. Filed with dremel without incident.  4. Return to clinic in 3 mos.     Thresa EMERSON Sar, DPM [facility]  Dr. Thresa EMERSON Sar, DPM    [address] [place], [place] [address]                Office [phone]  Fax [phone]

## 2024-06-25 ENCOUNTER — Other Ambulatory Visit: Payer: Self-pay

## 2024-06-25 ENCOUNTER — Encounter
Admission: RE | Admit: 2024-06-25 | Discharge: 2024-06-25 | Disposition: A | Discharge status: Home / Self Care | Source: Ambulatory Visit | Attending: Surgery | Admitting: Surgery

## 2024-06-25 DIAGNOSIS — J410 Simple chronic bronchitis: Secondary | ICD-10-CM

## 2024-06-25 DIAGNOSIS — Z01812 Encounter for preprocedural laboratory examination: Secondary | ICD-10-CM

## 2024-06-25 NOTE — Patient Instructions (Addendum)
 Your procedure is scheduled on: 07/02/2024 Wednesday Report to the Registration Desk on the 1st floor of the [facility]. To find out your arrival time, please call [phone] between 1PM - 3PM on: 07/01/2024  If your arrival time is 6:00 am, do not arrive before that time as the [facility] entrance doors do not open until 6:00 am.  REMEMBER: Instructions that are not followed completely may result in serious medical risk, up to and including death; or upon the discretion of your surgeon and anesthesiologist your surgery may need to be rescheduled.  Do not eat food after midnight the night before surgery.  No gum chewing or hard candies.  You may however, drink CLEAR liquids up to 2 hours before you are scheduled to arrive for your surgery. Do not drink anything within 2 hours of your scheduled arrival time.   In addition, your doctor has ordered for you to drink the provided:  Gatorade G2 Drinking this carbohydrate drink up to two hours before surgery helps to reduce insulin  resistance and improve patient outcomes. Please complete drinking 2 hours before scheduled arrival time.  One week prior to surgery: Stop Anti-inflammatories (NSAIDS) such as Advil , Aleve , Ibuprofen , Motrin , Naproxen , Naprosyn  and Aspirin  based products such as Excedrin, Goody's Powder, BC Powder. Stop ANY OVER THE COUNTER supplements until after surgery.   Dr. Edie is okay for you to continue taking aspirin  81 mg all the way until day prior to surgery.  You may however, continue to take Tylenol  if needed for pain up until the day of surgery.    Continue taking all of your other prescription medications up until the day of surgery.  ON THE DAY OF SURGERY ONLY TAKE THESE MEDICATIONS WITH SIPS OF WATER:  celecoxib (CELEBREX)   Use albuterol   inhaler  as prescribed and bring to the hospital.   No Alcohol for 24 hours before or after surgery.  No Smoking including e-cigarettes for 24 hours before  surgery.  No chewable tobacco products for at least 6 hours before surgery.  No nicotine  patches on the day of surgery.  Do not use any recreational drugs for at least a week (preferably 2 weeks) before your surgery.  Please be advised that the combination of cocaine and anesthesia may have negative outcomes, up to and including death. If you test positive for cocaine, your surgery will be cancelled.  On the morning of surgery brush your teeth with toothpaste and water, you may rinse your mouth with mouthwash if you wish. Do not swallow any toothpaste or mouthwash.  Use CHG Soap or wipes as directed on instruction sheet.-provided for you   Do not wear jewelry, make-up, hairpins, clips or nail polish.  Do not wear lotions, powders, or perfumes.   Do not shave body hair from the neck down 48 hours before surgery.  Contact lenses, hearing aids and dentures may not be worn into surgery.  Do not bring valuables to the hospital. [facility] is not responsible for any missing/lost belongings or valuables.    Notify your doctor if there is any change in your medical condition (cold, fever, infection).  Wear comfortable clothing (specific to your surgery type) to the hospital.  After surgery, you can help prevent lung complications by doing breathing exercises.  Take deep breaths and cough every 1-2 hours. Your doctor may order a device called an Incentive Spirometer to help you take deep breaths.  If you are being admitted to the hospital overnight, leave your suitcase  in the car. After surgery it may be brought to your room.  If you are being discharged the day of surgery, you will not be allowed to drive home. You will need a responsible individual to drive you home and stay with you for 24 hours after surgery.    Please call the Pre-admissions Testing Dept. at [phone] if you have any questions about these instructions.  Surgery Visitation Policy:  Patients having surgery  or a procedure may have two visitors.  Children under the age of 78 must have an adult with them who is not the patient.    Merchandiser, retail to address health-related social needs:  [email or web address]                                                                                                             Preparing for Surgery with CHLORHEXIDINE  GLUCONATE (CHG) Soap  Chlorhexidine  Gluconate (CHG) Soap  o An antiseptic cleaner that kills germs and bonds with the skin to continue killing germs even after washing  o Used for showering the night before surgery and morning of surgery  Before surgery, you can play an important role by reducing the number of germs on your skin.  CHG (Chlorhexidine  gluconate) soap is an antiseptic cleanser which kills germs and bonds with the skin to continue killing germs even after washing.  Please do not use if you have an allergy to CHG or antibacterial soaps. If your skin becomes reddened/irritated stop using the CHG.  1. Shower the NIGHT BEFORE SURGERY with CHG soap.  2. If you choose to wash your hair, wash your hair first as usual with your normal shampoo.  3. After shampooing, rinse your hair and body thoroughly to remove the shampoo.  4. Use CHG as you would any other liquid soap. You can apply CHG directly to the skin and wash gently with a clean washcloth.  5. Apply the CHG soap to your body only from the neck down. Do not use on open wounds or open sores. Avoid contact with your eyes, ears, mouth, and genitals (private parts). Wash face and genitals (private parts) with your normal soap.  6. Wash thoroughly, paying special attention to the area where your surgery will be performed.  7. Thoroughly rinse your body with warm water.  8. Do not shower/wash with your normal soap after using and rinsing off the CHG soap.  9. Do not use lotions, oils, etc., after showering with CHG.  10. Pat yourself dry with a clean  towel.  11. Wear clean pajamas to bed the night before surgery.  12. Place clean sheets on your bed the night of your shower and do not sleep with pets.  13. Do not apply any deodorants/lotions/powders.  14. Please wear clean clothes to the hospital.  15. Remember to brush your teeth with your regular toothpaste.

## 2024-07-01 ENCOUNTER — Inpatient Hospital Stay: Admission: RE | Admit: 2024-07-01 | Source: Ambulatory Visit

## 2024-07-01 ENCOUNTER — Encounter: Payer: Self-pay | Admitting: Urgent Care

## 2024-07-02 ENCOUNTER — Ambulatory Visit: Admission: RE | Admit: 2024-07-02 | Source: Home / Self Care | Admitting: Surgery

## 2024-07-02 ENCOUNTER — Encounter: Admission: RE | Payer: Self-pay | Source: Home / Self Care

## 2024-07-02 ENCOUNTER — Ambulatory Visit: Admitting: Urology

## 2024-07-02 SURGERY — RELEASE, CARPAL TUNNEL, ENDOSCOPIC
Anesthesia: Choice | Site: Wrist | Laterality: Right

## 2024-07-03 ENCOUNTER — Telehealth: Payer: Self-pay

## 2024-07-03 NOTE — Telephone Encounter (Signed)
 Copied from CRM [id number]. Topic: General - Other >> Jul 03, 2024  2:16 PM Amy B wrote: Reason for CRM: Patient would like to know what stage of cancer she is in and requests a call back to discuss.  [phone]

## 2024-07-03 NOTE — Telephone Encounter (Signed)
 Spoke with patient notified she needs to contact her urologist

## 2024-07-03 NOTE — Telephone Encounter (Signed)
 She needs to call her urologist to discuss this.  I do not know the answers to her questions.  All I know is that she had bladder cancer.

## 2024-07-09 ENCOUNTER — Other Ambulatory Visit: Payer: Self-pay | Admitting: Surgery

## 2024-07-17 ENCOUNTER — Encounter: Payer: Self-pay | Admitting: Oncology

## 2024-07-23 ENCOUNTER — Encounter: Admission: RE | Payer: Self-pay | Source: Home / Self Care

## 2024-07-23 ENCOUNTER — Ambulatory Visit: Admission: RE | Admit: 2024-07-23 | Source: Home / Self Care | Admitting: Surgery

## 2024-07-23 SURGERY — RELEASE, CARPAL TUNNEL, ENDOSCOPIC
Anesthesia: Choice | Site: Wrist | Laterality: Right

## 2024-08-13 ENCOUNTER — Ambulatory Visit (INDEPENDENT_AMBULATORY_CARE_PROVIDER_SITE_OTHER): Admitting: Urology

## 2024-08-13 VITALS — BP 107/67 | HR 96 | Ht 65.0 in | Wt 133.4 lb

## 2024-08-13 DIAGNOSIS — Z8551 Personal history of malignant neoplasm of bladder: Secondary | ICD-10-CM

## 2024-08-13 MED ORDER — LIDOCAINE HCL URETHRAL/MUCOSAL 2 % EX GEL
1.0000 | Freq: Once | CUTANEOUS | Status: AC
  Administered 2024-08-13: 1 via URETHRAL

## 2024-08-13 MED ORDER — SULFAMETHOXAZOLE-TRIMETHOPRIM 800-160 MG PO TABS
1.0000 | ORAL_TABLET | Freq: Once | ORAL | Status: AC
  Administered 2024-08-13: 1 via ORAL

## 2024-08-13 NOTE — Progress Notes (Signed)
 Bladder cancer surveillance note  INDICATION Bladder cancer  UROLOGIC HISTORY Kendra [name] is a 78 y.o. female who presented with gross hematuria and was found to have multiple bladder tumors and underwent TURBT on 06/23/2022 with resection of 2 separate papillary bladder tumors, the largest measuring 2.5 cm.  Initial Diagnosis of Bladder  Year: 06/2022 Pathology: HG T1  Recurrent Bladder Cancer Diagnosis None  Treatments for Bladder Cancer 06/23/2022: TURBT and gemcitabine , deferred second look TURBT with her comorbidities and frailty  Induction BCG completed February 2024  No showed multiple visits for initial surveillance cystoscopy  mBCG 03/2023 No showed further BCG treatments  AUA Risk Category High  Cystoscopy Procedure Note:  Bactrim  given for prophylaxis  After informed consent and discussion of the procedure and its risks, [name] was positioned and prepped in the standard fashion. Cystoscopy was performed with the a flexible cystoscope. The urethra, bladder neck and entire bladder was visualized in a standard fashion, and bladder was grossly normal throughout. The ureteral orifices were visualized in their normal location and orientation.  No abnormalities on retroflexion.  -No evidence of recurrence -CT ordered for routine staging per guideline recommendations -She defers further BCG -Can space to every 6 month cystoscopy at this point  Redell Burnet, MD 08/13/2024

## 2024-08-18 ENCOUNTER — Ambulatory Visit: Admitting: Internal Medicine

## 2024-08-18 ENCOUNTER — Telehealth: Payer: Self-pay | Admitting: Internal Medicine

## 2024-08-18 NOTE — Telephone Encounter (Unsigned)
 Copied from CRM #[id number]. Topic: Clinical - Medication Refill >> Aug 18, 2024 10:31 AM Gustabo D wrote: Medication: atorvastatin  (LIPITOR) 40 MG tablet  Has the patient contacted their pharmacy? No (Agent: If no, request that the patient contact the pharmacy for the refill. If patient does not wish to contact the pharmacy document the reason why and proceed with request.) (Agent: If yes, when and what did the pharmacy advise?)  This is the patient's preferred pharmacy:  [name] - [place], [place] - [address] [place] [address] Phone: [phone] Fax: [phone]    Is this the correct pharmacy for this prescription? Yes If no, delete pharmacy and type the correct one.   Has the prescription been filled recently? No  Is the patient out of the medication? Yes  Has the patient been seen for an appointment in the last year OR does the patient have an upcoming appointment? Yes  Can we respond through MyChart? No  Agent: Please be advised that Rx refills may take up to 3 business days. We ask that you follow-up with your pharmacy.

## 2024-08-18 NOTE — Progress Notes (Deleted)
 Subjective:    Patient ID: Kendra [name], female    DOB: 1946/08/17, 78 y.o.   MRN: [id number]  HPI  Pt presents to the clinic today for follow-up of chronic conditions.  HTN: Her BP today is 112/78.  She is not taking any antihypertensive medication at this time.  ECG from 12/2023 reviewed.  HLD with aortic atherosclerosis, CAD status post CVA: She reports residual left-sided weakness.  Her last LDL was 108, triglycerides 140, 04/2024.  She denies myalgias on atorvastatin .  She is taking aspirin  and clopidogrel  as well.  She does not consume low-fat diet.  DM 2: Her last A1c was 8.1%, 04/2024.  She is taking metformin  as prescribed.  She does not check her sugars.  She does not check her feet routinely.  Her last eye exam was 2021.  Flu 08/2022.  Pneumovax 07/2018.  Prevnar 11/2014.  COVID Pfizer x 2.  COPD: She reports chronic cough but denies shortness of breath.  She is using trelegy as prescribed and albuterol  only as needed.  She is not using Trelegy.  There are no PFTs on file.  She does continue to smoke.  GERD: Triggered by spicy foods.  She is not taking any medications for this at this time.  There is no upper GI on file.  OA: Mainly in her back.  She is taking tylenol  arthritis as needed with some relief of symptoms.  She does not follow with orthopedics.  Osteopenia: Her last bone density was >5 years ago.  She is not currently taking calcium  or vitamin d  OTC.  She does not get weightbearing exercise and daily  Depression: Deteriorated since she has been off her Bupropion .  She is not currently seeing a therapist.  She denies anxiety, SI/HI.  Iron  deficiency anemia: Her last H/H was 12.3/37.1,/2025.  She is not taking any oral iron  at this time but has been getting iron  infusions.  She follows with heme allergy.  Insomnia: She has difficulty falling and staying asleep.  She is not currently taking any medications for this.  There is no sleep study on file.  Bladder cancer:  Status post tumor removal.  She is no longer getting BCG infusions.  She follows with urology and oncology.  Chronic right shoulder pain.  Managed with celecoxib and tylenol  arthritis OTC with minimal relief of symptoms.  X-ray from 04/2021 reviewed.  Review of Systems     Past Medical History:  Diagnosis Date   Aortic atherosclerosis    Asthma    Back pain    Bladder tumor    Carotid artery stenosis    Carpal tunnel syndrome of right wrist    Cataract    Cervical disc disorder    COPD (chronic obstructive pulmonary disease) (HCC)    Depression    Diabetic neuropathy (HCC)    DM (diabetes mellitus), type 2 (HCC)    Dyspnea    with exertion   GERD (gastroesophageal reflux disease)    Hematuria    Hemiparesis affecting left side as late effect of cerebrovascular accident (CVA) (HCC)    History of kidney stones    per patient can't remember the year   Hyperlipidemia    Hypertension    per patient take meds for it   IDA (iron  deficiency anemia)    Insomnia    Neuropathy    Osteoarthritis    Osteopenia    Pneumonia 2018   per patient   Reflux    Rheumatoid arthritis (HCC)  Stroke [name]) 2015   and again 2017  - Weakness in left leg, staggers w/ walking, vision    Tenosynovitis    Urothelial carcinoma of bladder (HCC)    Vitamin B 12 deficiency     Current Outpatient Medications  Medication Sig Dispense Refill   acetaminophen  (TYLENOL ) 500 MG tablet Take 500 mg by mouth every 6 (six) hours as needed (pain.).     albuterol  (VENTOLIN  HFA) 108 (90 Base) MCG/ACT inhaler INHALE 1-2 PUFFS BY MOUTH EVERY 6 HOURS AS NEEDED 8.5 g 5   aspirin  81 MG chewable tablet Chew 81 mg by mouth daily.     atorvastatin  (LIPITOR) 40 MG tablet Take 1 tablet (40 mg total) by mouth at bedtime. 90 tablet 0   celecoxib (CELEBREX) 200 MG capsule Take 200 mg by mouth daily with breakfast.     Fluticasone -Umeclidin-Vilant (TRELEGY ELLIPTA ) 100-62.5-25 MCG/ACT AEPB Inhale 1 puff into the lungs at  bedtime. 60 each 5   metFORMIN  (GLUCOPHAGE ) 500 MG tablet Take 1 tablet (500 mg total) by mouth daily with breakfast for 7 days, THEN 1 tablet (500 mg total) 2 (two) times daily with a meal for 7 days, THEN 2 tablets (1,000 mg total) 2 (two) times daily with a meal. (Patient not taking: No sig reported) 325 tablet 0   nicotine  polacrilex (NICORETTE ) 4 MG gum Take 1 each (4 mg total) by mouth as needed for smoking cessation. 30 tablet 0   No current facility-administered medications for this visit.   Facility-Administered Medications Ordered in Other Visits  Medication Dose Route Frequency Provider Last Rate Last Admin   ceFAZolin  (ANCEF ) IVPB 2g/100 mL premix  2 g Intravenous 60 min Pre-Op Claudene Penne ORN, MD       chlorhexidine  (PERIDEX ) 0.12 % solution 15 mL  15 mL Mouth/Throat Once Chesley Lendia CROME, MD       Or   Oral care mouth rinse  15 mL Mouth Rinse Once Chesley Lendia CROME, MD        Allergies  Allergen Reactions   Metformin  Hcl Er Diarrhea   Citalopram  Nausea Only    Family History  Problem Relation Age of Onset   Stroke Father    Depression Father    Diabetes Mother    Hyperlipidemia Mother    Breast cancer Mother    Heart murmur Son    Heart murmur Son    Heart murmur Son     Social History   Socioeconomic History   Marital status: Widowed    Spouse name: Not on file   Number of children: 3   Years of education: 15   Highest education level: Not on file  Occupational History   Occupation: Disabled  Tobacco Use   Smoking status: Every Day    Current packs/day: 0.25    Average packs/day: 0.3 packs/day for 61.0 years (15.3 ttl pk-yrs)    Types: Cigarettes    Passive exposure: Current   Smokeless tobacco: Former    Types: Snuff  Vaping Use   Vaping status: Never Used  Substance and Sexual Activity   Alcohol use: No   Drug use: No   Sexual activity: Never  Other Topics Concern   Not on file  Social History Narrative   Not on file   Social Drivers of  Health   Financial Resource Strain: Low Risk  (03/12/2024)   Received from [facility]   Overall Financial Resource Strain (CARDIA)    Difficulty of Paying Living Expenses:  Not hard at all  Food Insecurity: Food Insecurity Present (03/12/2024)   Received from [facility]   Hunger Vital Sign    Within the past 12 months, you worried that your food would run out before you got the money to buy more.: Sometimes true    Within the past 12 months, the food you bought just didn't last and you didn't have money to get more.: Sometimes true  Transportation Needs: No Transportation Needs (03/12/2024)   Received from [facility] - Transportation    In the past 12 months, has lack of transportation kept you from medical appointments or from getting medications?: No    Lack of Transportation (Non-Medical): No  Physical Activity: Sufficiently Active (06/07/2023)   Exercise Vital Sign    Days of Exercise per Week: 3 days    Minutes of Exercise per Session: 60 min  Stress: No Stress Concern Present (06/07/2023)   [employer] - Occupational Stress Questionnaire    Feeling of Stress : Only a little  Social Connections: Socially Isolated (06/07/2023)   Social Connection and Isolation Panel    Frequency of Communication with Friends and Family: More than three times a week    Frequency of Social Gatherings with Friends and Family: Once a week    Attends Religious Services: Never    Database Administrator or Organizations: No    Attends Banker Meetings: Never    Marital Status: Widowed  Intimate Partner Violence: Not At Risk (06/07/2023)   Humiliation, Afraid, Rape, and Kick questionnaire    Fear of Current or Ex-Partner: No    Emotionally Abused: No    Physically Abused: No    Sexually Abused: No     Constitutional: Patient reports unintentional weight loss.  Denies fever, malaise, fatigue,  headache.  HEENT: Denies eye pain, eye redness, ear pain, ringing in the ears, wax buildup, runny nose, nasal congestion, bloody nose, or sore throat. Respiratory: Patient reports chronic cough.  Denies difficulty breathing, shortness of breath,  or sputum production.   Cardiovascular: Denies chest pain, chest tightness, palpitations or swelling in the hands or feet.  Gastrointestinal: Denies abdominal pain, bloating, constipation, diarrhea or blood in the stool.  GU: Denies urgency, frequency, pain with urination, burning sensation, blood in urine, odor or discharge. Musculoskeletal: Patient reports left-sided weakness, right shoulder pain.  Denies difficulty with gait, muscle pain or joint  swelling.  Skin: Patient reports burn to top of left foot.  Denies redness, rashes, lesions or ulcercations.  Neurological: Denies dizziness, difficulty with memory, difficulty with speech or problems with balance and coordination.  Psych: Patient has a history of depression.  Denies anxiety, SI/HI.  No other specific complaints in a complete review of systems (except as listed in HPI above).  Objective:   Physical Exam There were no vitals taken for this visit.  Wt Readings from Last 3 Encounters:  08/13/24 133 lb 6.4 oz (60.5 kg)  06/24/24 123 lb (55.8 kg)  05/28/24 123 lb (55.8 kg)    General: Appears her stated age, chronically ill appearing, in NAD. Skin: Hypopigmented area noted to the top of the left foot.  No blisters no redness no purulent drainage noted. Neck:  Neck supple, trachea midline. No masses, lumps or thyromegaly present.  Cardiovascular: Normal rate and rhythm. S1,S2 noted.  No murmur, rubs or gallops noted. No JVD or BLE edema. No carotid bruits noted. Pulmonary/Chest: Normal effort and coarse  breath sounds. No respiratory distress. No wheezes, rales or ronchi noted.  Abdomen: Soft and nontender. Normal bowel sounds.  Musculoskeletal: Decreased internal and external rotation of  the right shoulder.  Negative drop can test on the right.  She has fullness noted within the proximal bicep.  Strength 5/5 BUE.  Handgrips equal.  Gait slow and steady with use of cane. Neurological: Alert and oriented.  Coordination normal.  Psychiatric: Mood and affect mildly flat. Behavior is normal. Judgment and thought content normal.     BMET    Component Value Date/Time   NA 141 05/13/2024 1323   NA 141 01/18/2016 1150   NA 138 09/01/2014 2130   K 4.6 05/13/2024 1323   K 3.5 09/01/2014 2130   CL 107 05/13/2024 1323   CL 104 09/01/2014 2130   CO2 29 05/13/2024 1323   CO2 28 09/01/2014 2130   GLUCOSE 189 (H) 05/13/2024 1323   GLUCOSE 287 (H) 09/01/2014 2130   BUN 15 05/13/2024 1323   BUN 12 01/18/2016 1150   BUN 14 09/01/2014 2130   CREATININE 0.88 05/13/2024 1323   CALCIUM  9.6 05/13/2024 1323   CALCIUM  8.9 09/01/2014 2130   GFRNONAA >60 11/26/2023 0818   GFRNONAA 70 06/14/2020 0929   GFRAA 82 06/14/2020 0929    Lipid Panel     Component Value Date/Time   CHOL 185 05/13/2024 1323   CHOL 177 06/07/2015 1215   CHOL 141 09/02/2014 0440   TRIG 140 05/13/2024 1323   TRIG 155 09/02/2014 0440   HDL 52 05/13/2024 1323   HDL 56 06/07/2015 1215   HDL 37 (L) 09/02/2014 0440   CHOLHDL 3.6 05/13/2024 1323   VLDL 16 10/17/2019 0606   VLDL 31 09/02/2014 0440   LDLCALC 108 (H) 05/13/2024 1323   LDLCALC 73 09/02/2014 0440    CBC    Component Value Date/Time   WBC 4.7 05/13/2024 1323   RBC 4.03 05/13/2024 1323   HGB 12.3 05/13/2024 1323   HGB 12.7 09/01/2014 2130   HCT 37.1 05/13/2024 1323   HCT 37.9 09/01/2014 2130   PLT 237 05/13/2024 1323   PLT 260 09/05/2014 0517   MCV 92.1 05/13/2024 1323   MCV 92 09/01/2014 2130   MCH 30.5 05/13/2024 1323   MCHC 33.2 05/13/2024 1323   RDW 13.1 05/13/2024 1323   RDW 13.9 09/01/2014 2130   LYMPHSABS 1.7 08/22/2022 1209   MONOABS 0.3 08/22/2022 1209   EOSABS 0.1 08/22/2022 1209   BASOSABS 0.0 08/22/2022 1209    Hgb  A1C Lab Results  Component Value Date   HGBA1C 8.1 (H) 05/13/2024            Assessment & Plan:     RTC in 3 months, for your annual exam Angeline Laura, NP

## 2024-08-20 ENCOUNTER — Other Ambulatory Visit: Payer: Self-pay | Admitting: Internal Medicine

## 2024-08-21 ENCOUNTER — Other Ambulatory Visit: Payer: Self-pay | Admitting: Surgery

## 2024-08-21 MED ORDER — ATORVASTATIN CALCIUM 40 MG PO TABS: 40.0000 mg | ORAL_TABLET | Freq: Every evening | ORAL | 0 refills | Status: AC

## 2024-08-21 NOTE — Telephone Encounter (Signed)
 Requested Prescriptions  Pending Prescriptions Disp Refills   atorvastatin  (LIPITOR) 40 MG tablet 90 tablet 0    Sig: Take 1 tablet (40 mg total) by mouth at bedtime.     Cardiovascular:  Antilipid - Statins Failed - 08/21/2024 11:58 AM      Failed - Lipid Panel in normal range within the last 12 months    Cholesterol, Total  Date Value Ref Range Status  06/07/2015 177 100 - 199 mg/dL Final   Cholesterol  Date Value Ref Range Status  05/13/2024 185 <200 mg/dL Final  87/83/7984 858 0 - 200 mg/dL Final   Ldl Cholesterol, Calc  Date Value Ref Range Status  09/02/2014 73 0 - 100 mg/dL Final   LDL Cholesterol (Calc)  Date Value Ref Range Status  05/13/2024 108 (H) mg/dL (calc) Final    Comment:    Reference range: <100 . Desirable range <100 mg/dL for primary prevention;   <70 mg/dL for patients with CHD or diabetic patients  with > or = 2 CHD risk factors. SABRA LDL-C is now calculated using the Martin-Hopkins  calculation, which is a validated novel method providing  better accuracy than the Friedewald equation in the  estimation of LDL-C.  Gladis APPLETHWAITE et al. SANDREA. 7986;689(80): 2061-2068  (http://education.[facility].com/faq/FAQ164)    HDL Cholesterol  Date Value Ref Range Status  09/02/2014 37 (L) 40 - 60 mg/dL Final   HDL  Date Value Ref Range Status  05/13/2024 52 > OR = 50 mg/dL Final  90/80/7983 56 >60 mg/dL Final    Comment:    According to ATP-III Guidelines, HDL-C >59 mg/dL is considered a negative risk factor for CHD.    Triglycerides  Date Value Ref Range Status  05/13/2024 140 <150 mg/dL Final  87/83/7984 844 0 - 200 mg/dL Final         Passed - Patient is not pregnant      Passed - Valid encounter within last 12 months    Recent Outpatient Visits           3 months ago Encounter for general adult medical examination with abnormal findings   [place] [facility] [place], Angeline ORN, NP

## 2024-08-22 NOTE — Telephone Encounter (Signed)
 Requested Prescriptions  Refused Prescriptions Disp Refills   atorvastatin  (LIPITOR) 40 MG tablet [Pharmacy Med Name: ATORVASTATIN  CALCIUM  40 MG TAB] 90 tablet 0    Sig: TAKE 1 TABLET BY MOUTH AT BEDTIME     Cardiovascular:  Antilipid - Statins Failed - 08/22/2024  3:34 PM      Failed - Lipid Panel in normal range within the last 12 months    Cholesterol, Total  Date Value Ref Range Status  06/07/2015 177 100 - 199 mg/dL Final   Cholesterol  Date Value Ref Range Status  05/13/2024 185 <200 mg/dL Final  87/83/7984 858 0 - 200 mg/dL Final   Ldl Cholesterol, Calc  Date Value Ref Range Status  09/02/2014 73 0 - 100 mg/dL Final   LDL Cholesterol (Calc)  Date Value Ref Range Status  05/13/2024 108 (H) mg/dL (calc) Final    Comment:    Reference range: <100 . Desirable range <100 mg/dL for primary prevention;   <70 mg/dL for patients with CHD or diabetic patients  with > or = 2 CHD risk factors. SABRA LDL-C is now calculated using the Martin-Hopkins  calculation, which is a validated novel method providing  better accuracy than the Friedewald equation in the  estimation of LDL-C.  Gladis APPLETHWAITE et al. SANDREA. 7986;689(80): 2061-2068  (http://education.[facility].com/faq/FAQ164)    HDL Cholesterol  Date Value Ref Range Status  09/02/2014 37 (L) 40 - 60 mg/dL Final   HDL  Date Value Ref Range Status  05/13/2024 52 > OR = 50 mg/dL Final  90/80/7983 56 >60 mg/dL Final    Comment:    According to ATP-III Guidelines, HDL-C >59 mg/dL is considered a negative risk factor for CHD.    Triglycerides  Date Value Ref Range Status  05/13/2024 140 <150 mg/dL Final  87/83/7984 844 0 - 200 mg/dL Final         Passed - Patient is not pregnant      Passed - Valid encounter within last 12 months    Recent Outpatient Visits           3 months ago Encounter for general adult medical examination with abnormal findings   [place] [facility] [place], Angeline ORN, NP

## 2024-09-03 ENCOUNTER — Other Ambulatory Visit: Payer: Self-pay | Admitting: Internal Medicine

## 2024-09-04 ENCOUNTER — Other Ambulatory Visit: Payer: Self-pay

## 2024-09-04 ENCOUNTER — Inpatient Hospital Stay: Admission: RE | Admit: 2024-09-04 | Discharge: 2024-09-04 | Attending: Surgery

## 2024-09-04 DIAGNOSIS — E118 Type 2 diabetes mellitus with unspecified complications: Secondary | ICD-10-CM

## 2024-09-04 DIAGNOSIS — Z01810 Encounter for preprocedural cardiovascular examination: Secondary | ICD-10-CM

## 2024-09-04 DIAGNOSIS — E119 Type 2 diabetes mellitus without complications: Secondary | ICD-10-CM

## 2024-09-04 DIAGNOSIS — Z01812 Encounter for preprocedural laboratory examination: Secondary | ICD-10-CM

## 2024-09-04 DIAGNOSIS — M25531 Pain in right wrist: Secondary | ICD-10-CM

## 2024-09-04 DIAGNOSIS — I10 Essential (primary) hypertension: Secondary | ICD-10-CM

## 2024-09-04 NOTE — Patient Instructions (Addendum)
 Your procedure is scheduled on: 09/17/24 - Wednesday Report to the Registration Desk on the 1st floor of the [facility]. To find out your arrival time, please call [phone] between 1PM - 3PM on: 09/16/24 - Tuesday If your arrival time is 6:00 am, do not arrive before that time as the [facility] entrance doors do not open until 6:00 am.  REMEMBER: Instructions that are not followed completely may result in serious medical risk, up to and including death; or upon the discretion of your surgeon and anesthesiologist your surgery may need to be rescheduled.  Do not eat food after midnight the night before surgery.  No gum chewing or hard candies.  You may however, drink CLEAR liquids up to 2 hours before you are scheduled to arrive for your surgery. Do not drink anything within 2 hours of your scheduled arrival time.  Clear liquids include: - water   In addition, your doctor has ordered for you to drink the provided:  Gatorade G2 Drinking this carbohydrate drink up to two hours before surgery helps to reduce insulin  resistance and improve patient outcomes. Please complete drinking 2 hours before scheduled arrival time.  One week prior to surgery: Stop Anti-inflammatories (NSAIDS) such as Advil , Aleve , Ibuprofen , Motrin , Naproxen , Naprosyn  and Aspirin  based products such as Excedrin, Goody's Powder, BC Powder. You may continue to take Tylenol  if needed for pain up until the day of surgery.  Stop ANY OVER THE COUNTER supplements until after surgery.  ON THE DAY OF SURGERY ONLY TAKE THESE MEDICATIONS WITH SIPS OF WATER:  celecoxib (CELEBREX)   Use inhalers albuterol  (VENTOLIN  HFA)  on the day of surgery and bring to the hospital.  No Alcohol for 24 hours before or after surgery.  No Smoking including e-cigarettes for 24 hours before surgery.  No chewable tobacco products for at least 6 hours before surgery.  No nicotine  patches on the day of surgery.  Do not use any  recreational drugs for at least a week (preferably 2 weeks) before your surgery.  Please be advised that the combination of cocaine and anesthesia may have negative outcomes, up to and including death. If you test positive for cocaine, your surgery will be cancelled.  On the morning of surgery brush your teeth with toothpaste and water, you may rinse your mouth with mouthwash if you wish. Do not swallow any toothpaste or mouthwash.  Use CHG Soap or wipes as directed on instruction sheet.  Do not wear jewelry, make-up, hairpins, clips or nail polish.  For welded (permanent) jewelry: bracelets, anklets, waist bands, etc.  Please have this removed prior to surgery.  If it is not removed, there is a chance that hospital personnel will need to cut it off on the day of surgery.  Do not wear lotions, powders, or perfumes.   Do not shave body hair from the neck down 48 hours before surgery.  Contact lenses, hearing aids and dentures may not be worn into surgery.  Do not bring valuables to the hospital. [facility] is not responsible for any missing/lost belongings or valuables.   Notify your doctor if there is any change in your medical condition (cold, fever, infection).  Wear comfortable clothing (specific to your surgery type) to the hospital.  After surgery, you can help prevent lung complications by doing breathing exercises.  Take deep breaths and cough every 1-2 hours. Your doctor may order a device called an Incentive Spirometer to help you take deep breaths.  If you are  being admitted to the hospital overnight, leave your suitcase in the car. After surgery it may be brought to your room.  In case of increased patient census, it may be necessary for you, the patient, to continue your postoperative care in the Same Day Surgery department.  If you are being discharged the day of surgery, you will not be allowed to drive home. You will need a responsible individual to drive you home  and stay with you for 24 hours after surgery.   If you are taking public transportation, you will need to have a responsible individual with you.  Please call the Pre-admissions Testing Dept. at [phone] if you have any questions about these instructions.  Surgery Visitation Policy:  Patients having surgery or a procedure may have two visitors.  Children under the age of 37 must have an adult with them who is not the patient.  Inpatient Visitation:    Visiting hours are 7 a.m. to 8 p.m. Up to four visitors are allowed at one time in a patient room. The visitors may rotate out with other people during the day.  One visitor age 35 or older may stay with the patient overnight and must be in the room by 8 p.m.   Merchandiser, Retail to address health-related social needs:  [email or web address]  Preparing the Skin Before Surgery  To help prevent the risk of infection at your surgical site, we are now providing you with rinse-free Sage 2% Chlorhexidine  Gluconate (CHG) disposable wipes.  Chlorhexidine  Gluconate (CHG) Soap  o An antiseptic cleaner that kills germs and bonds with the skin to continue killing germs even after washing  o Used for showering the night before surgery and morning of surgery  The night before surgery: Shower or bathe with warm water. Do not apply perfume, lotions, powders. Wait one hour after shower. Skin should be dry and cool. Open Sage wipe package - use 6 disposable cloths. Wipe body using one cloth for the right arm, one cloth for the left arm, one cloth for the right leg, one cloth for the left leg, one cloth for the chest/abdomen area, and one cloth for the back. Do not use on open wounds or sores. Do not use on face or genitals (private parts). If you are breast feeding, do not use on breasts. 5. Do not rinse, allow to dry. 6. Skin may feel tacky for several minutes. 7. Dress in clean clothes. 8. Place clean sheets on  your bed and do not sleep with pets.  REPEAT ABOVE ON THE MORNING OF SURGERY BEFORE ARRIVING TO THE HOSPITAL.

## 2024-09-05 ENCOUNTER — Ambulatory Visit

## 2024-09-05 ENCOUNTER — Other Ambulatory Visit: Payer: Self-pay | Admitting: Internal Medicine

## 2024-09-05 DIAGNOSIS — Z0000 Encounter for general adult medical examination without abnormal findings: Secondary | ICD-10-CM | POA: Diagnosis not present

## 2024-09-05 DIAGNOSIS — J410 Simple chronic bronchitis: Secondary | ICD-10-CM

## 2024-09-05 DIAGNOSIS — Z780 Asymptomatic menopausal state: Secondary | ICD-10-CM | POA: Diagnosis not present

## 2024-09-05 DIAGNOSIS — C679 Malignant neoplasm of bladder, unspecified: Secondary | ICD-10-CM

## 2024-09-05 DIAGNOSIS — M150 Primary generalized (osteo)arthritis: Secondary | ICD-10-CM

## 2024-09-05 DIAGNOSIS — I69354 Hemiplegia and hemiparesis following cerebral infarction affecting left non-dominant side: Secondary | ICD-10-CM

## 2024-09-05 DIAGNOSIS — E118 Type 2 diabetes mellitus with unspecified complications: Secondary | ICD-10-CM

## 2024-09-05 NOTE — Patient Instructions (Addendum)
 Kendra [name],  Thank you for taking the time for your Medicare Wellness Visit. I appreciate your continued commitment to your health goals. Please review the care plan we discussed, and feel free to reach out if I can assist you further.  Please note that Annual Wellness Visits do not include a physical exam. Some assessments may be limited, especially if the visit was conducted virtually. If needed, we may recommend an in-person follow-up with your provider.  Ongoing Care Seeing your primary care provider every 3 to 6 months helps us  monitor your health and provide consistent, personalized care.   Referrals If a referral was made during today's visit and you haven't received any updates within two weeks, please contact the referred provider directly to check on the status.  REFERRAL SENT FOR BONE DENSITY SCAN   Recommended Screenings:  Health Maintenance  Topic Date Due   Zoster (Shingles) Vaccine (1 of 2) 04[birth year]26/1967   Eye exam for diabetics  07/30/2020   Flu Shot  04/18/2024   DTaP/Tdap/Td vaccine (2 - Td or Tdap) 05/13/2025*   Hemoglobin A1C  11/13/2024   Yearly kidney function blood test for diabetes  05/13/2025   Yearly kidney health urinalysis for diabetes  05/13/2025   Complete foot exam   05/13/2025   Medicare Annual Wellness Visit  09/05/2025   Pneumococcal Vaccine for age over 80  Completed   Osteoporosis screening with Bone Density Scan  Completed   Hepatitis C Screening  Completed   Meningitis B Vaccine  Aged Out   Breast Cancer Screening  Discontinued   Colon Cancer Screening  Discontinued   COVID-19 Vaccine  Discontinued  *Topic was postponed. The date shown is not the original due date.     Vision: Annual vision screenings are recommended for early detection of glaucoma, cataracts, and diabetic retinopathy. These exams can also reveal signs of chronic conditions such as diabetes and high blood pressure.  Dental: Annual dental screenings help detect early signs  of oral cancer, gum disease, and other conditions linked to overall health, including heart disease and diabetes.  Please see the attached documents for additional preventive care recommendations.   NEXT AWV 09/09/25 @ 1:40 PM IN PERSON

## 2024-09-05 NOTE — Progress Notes (Signed)
 "  Chief Complaint  Patient presents with   Medicare Wellness     Subjective:   Kendra [name] is a 78 y.o. female who presents for a Medicare Annual Wellness Visit.  Visit info / Clinical Intake: Medicare Wellness Visit Type:: Subsequent Annual Wellness Visit Persons participating in visit and providing information:: patient Medicare Wellness Visit Mode:: Telephone If telephone:: video declined Since this visit was completed virtually, some vitals may be partially provided or unavailable. Missing vitals are due to the limitations of the virtual format.: Unable to obtain vitals - no equipment If Telephone or Video please confirm:: I connected with patient using audio/video enable telemedicine. I verified patient identity with two identifiers, discussed telehealth limitations, and patient agreed to proceed. Patient Location:: home Provider Location:: home office Interpreter Needed?: No Pre-visit prep was completed: yes AWV questionnaire completed by patient prior to visit?: yes Date:: 09/04/24 Living arrangements:: with family/others Patient's Overall Health Status Rating: (!) poor Typical amount of pain: none Does pain affect daily life?: no Are you currently prescribed opioids?: no  Dietary Habits and Nutritional Risks How many meals a day?: 2 Eats fruit and vegetables daily?: yes Most meals are obtained by: having others provide food In the last 2 weeks, have you had any of the following?: (!) nausea, vomiting, diarrhea (nausea) Diabetic:: no  Functional Status Activities of Daily Living (to include ambulation/medication): (!) Needs Assist Feeding: Independent Dressing/Grooming: Independent Bathing: Independent Toileting: Independent Transfer: Independent Ambulation: Independent Medication Administration: Independent Manage your own finances?: yes Primary transportation is: family / friends Concerns about vision?: no *vision screening is required for WTM* (needs  glasses-) Concerns about hearing?: no  Fall Screening Falls in the past year?: 0 Number of falls in past year: 0 Was there an injury with Fall?: 0 Fall Risk Category Calculator: 0 Patient Fall Risk Level: Low Fall Risk  Fall Risk Patient at Risk for Falls Due to: Impaired balance/gait Fall risk Follow up: Falls evaluation completed; Falls prevention discussed  Home and Transportation Safety: All rugs have non-skid backing?: yes All stairs or steps have railings?: N/A, no stairs Grab bars in the bathtub or shower?: (!) no Have non-skid surface in bathtub or shower?: yes Good home lighting?: yes Regular seat belt use?: yes Hospital stays in the last year:: no  Cognitive Assessment Difficulty concentrating, remembering, or making decisions? : no Will 6CIT or Mini Cog be Completed: yes What year is it?: 0 points What month is it?: 0 points Give patient an address phrase to remember (5 components): [address] [place], [place] About what time is it?: 0 points Count backwards from 20 to 1: 4 points Say the months of the year in reverse: 4 points Repeat the address phrase from earlier: 0 points 6 CIT Score: 8 points  Advance Directives (For Healthcare) Does Patient Have a Medical Advance Directive?: No Does patient want to make changes to medical advance directive?: No - Patient declined Type of Advance Directive: Living will Would patient like information on creating a medical advance directive?: No - Patient declined  Reviewed/Updated  Reviewed/Updated: Reviewed All (Medical, Surgical, Family, Medications, Allergies, Care Teams, Patient Goals)    Allergies (verified) Metformin  hcl er and Citalopram    Current Medications (verified) Outpatient Encounter Medications as of 09/05/2024  Medication Sig   acetaminophen  (TYLENOL ) 500 MG tablet Take 500 mg by mouth every 6 (six) hours as needed for moderate pain (pain score 4-6).   albuterol  (VENTOLIN  HFA) 108 (90 Base)  MCG/ACT inhaler INHALE 1-2  PUFFS BY MOUTH EVERY 6 HOURS AS NEEDED   aspirin  81 MG chewable tablet Chew 81 mg by mouth daily.   atorvastatin  (LIPITOR) 40 MG tablet Take 1 tablet (40 mg total) by mouth at bedtime.   celecoxib (CELEBREX) 200 MG capsule Take 200 mg by mouth daily with breakfast.   Fluticasone -Umeclidin-Vilant (TRELEGY ELLIPTA ) 100-62.5-25 MCG/ACT AEPB Inhale 1 puff into the lungs at bedtime. (Patient taking differently: Inhale 1 puff into the lungs at bedtime as needed (shortness of breath).)   metFORMIN  (GLUCOPHAGE ) 500 MG tablet Take 1 tablet (500 mg total) by mouth daily with breakfast for 7 days, THEN 1 tablet (500 mg total) 2 (two) times daily with a meal for 7 days, THEN 2 tablets (1,000 mg total) 2 (two) times daily with a meal. (Patient not taking: No sig reported)   nicotine  polacrilex (NICORETTE ) 4 MG gum Take 1 each (4 mg total) by mouth as needed for smoking cessation. (Patient not taking: Reported on 09/05/2024)   Facility-Administered Encounter Medications as of 09/05/2024  Medication   ceFAZolin  (ANCEF ) IVPB 2g/100 mL premix   chlorhexidine  (PERIDEX ) 0.12 % solution 15 mL   Or   Oral care mouth rinse    History: Past Medical History:  Diagnosis Date   Anginal pain    Aortic atherosclerosis    Asthma    Back pain    Bladder tumor    Carotid artery stenosis    Carpal tunnel syndrome of right wrist    Cataract    Cervical disc disorder    COPD (chronic obstructive pulmonary disease) (HCC)    Depression    Diabetic neuropathy (HCC)    DM (diabetes mellitus), type 2 (HCC)    Dyspnea    Dyspnea on exertion    GERD (gastroesophageal reflux disease)    Hematuria    Hemiparesis affecting left side as late effect of cerebrovascular accident (CVA) (HCC)    History of kidney stones    Hyperlipidemia    Hypertension    IDA (iron  deficiency anemia)    Insomnia    Neuropathy    Osteoarthritis    Osteopenia    Pneumonia 2018   Reflux    Rheumatoid arthritis  (HCC)    Stroke (HCC) 2015   and again 2017  - Weakness in left leg, staggers w/ walking, vision    Tenosynovitis    Urothelial carcinoma of bladder (HCC)    Vitamin B 12 deficiency    Past Surgical History:  Procedure Laterality Date   BLADDER INSTILLATION N/A 06/23/2022   Procedure: BLADDER INSTILLATION OF GEMCITABINE ;  Surgeon: Francisca Redell BROCKS, MD;  Location: [facility] ORS;  Service: Urology;  Laterality: N/A;   BREAST BIOPSY Right    Fatty Tumor   ENDARTERECTOMY Right 10/21/2019   Procedure: ENDARTERECTOMY CAROTID RIGHT;  Surgeon: Sheree Penne Bruckner, MD;  Location: [facility] OR;  Service: Vascular;  Laterality: Right;   ENDARTERECTOMY Left 12/30/2019   Procedure: LEFT CAROTID ENDARTERECTOMY;  Surgeon: Sheree Penne Bruckner, MD;  Location: [facility] OR;  Service: Vascular;  Laterality: Left;   NECK SURGERY     OVARIAN CYST REMOVAL     PATCH ANGIOPLASTY Right 10/21/2019   Procedure: Patch Angioplasty Using XenoSure Biologic Patch Right Carotid;  Surgeon: Sheree Penne Bruckner, MD;  Location: [facility] OR;  Service: Vascular;  Laterality: Right;   PATCH ANGIOPLASTY Left 12/30/2019   Procedure: Patch Angioplasty Left Carotid;  Surgeon: Sheree Penne Bruckner, MD;  Location: [facility] OR;  Service: Vascular;  Laterality: Left;   REVERSE  SHOULDER ARTHROPLASTY Right 04/10/2023   Procedure: REVERSE SHOULDER ARTHROPLASTY WITH BICEPS TENODESIS;  Surgeon: Edie Norleen PARAS, MD;  Location: [facility] ORS;  Service: Orthopedics;  Laterality: Right;  MAKE SECOND CASE OF THE DAY   TRANSURETHRAL RESECTION OF BLADDER TUMOR N/A 06/23/2022   Procedure: TRANSURETHRAL RESECTION OF BLADDER TUMOR (TURBT);  Surgeon: Francisca Redell BROCKS, MD;  Location: [facility] ORS;  Service: Urology;  Laterality: N/A;   Family History  Problem Relation Age of Onset   Stroke Father    Depression Father    Diabetes Mother    Hyperlipidemia Mother    Breast cancer Mother    Heart murmur Son    Heart murmur Son    Heart murmur Son    Social History    Occupational History   Occupation: Disabled  Tobacco Use   Smoking status: Every Day    Current packs/day: 0.25    Average packs/day: 0.3 packs/day for 61.0 years (15.3 ttl pk-yrs)    Types: Cigarettes    Passive exposure: Current   Smokeless tobacco: Former    Types: Snuff  Vaping Use   Vaping status: Never Used  Substance and Sexual Activity   Alcohol use: No   Drug use: No   Sexual activity: Never   Tobacco Counseling Ready to quit: Not Answered Counseling given: Not Answered  SDOH Screenings   Food Insecurity: No Food Insecurity (09/05/2024)  Housing: Unknown (09/05/2024)  Transportation Needs: No Transportation Needs (09/05/2024)  Utilities: Not At Risk (09/05/2024)  Alcohol Screen: Low Risk (06/07/2023)  Depression (PHQ2-9): Medium Risk (09/05/2024)  Financial Resource Strain: Low Risk  (03/12/2024)   Received from [facility]  Physical Activity: Insufficiently Active (09/05/2024)  Social Connections: Socially Isolated (09/05/2024)  Stress: Stress Concern Present (09/05/2024)  Tobacco Use: High Risk (09/05/2024)  Health Literacy: Adequate Health Literacy (09/05/2024)   See flowsheets for full screening details  Depression Screen PHQ 2 & 9 Depression Scale- Over the past 2 weeks, how often have you been bothered by any of the following problems? Little interest or pleasure in doing things: 2 Feeling down, depressed, or hopeless (PHQ Adolescent also includes...irritable): 2 PHQ-2 Total Score: 4 Trouble falling or staying asleep, or sleeping too much: 0 Feeling tired or having little energy: 2 Poor appetite or overeating (PHQ Adolescent also includes...weight loss): 0 Feeling bad about yourself - or that you are a failure or have let yourself or your family down: 0 Trouble concentrating on things, such as reading the newspaper or watching television (PHQ Adolescent also includes...like school work): 0 Moving or speaking so slowly that other  people could have noticed. Or the opposite - being so fidgety or restless that you have been moving around a lot more than usual: 0 Thoughts that you would be better off dead, or of hurting yourself in some way: 0 PHQ-9 Total Score: 6 If you checked off any problems, how difficult have these problems made it for you to do your work, take care of things at home, or get along with other people?: Not difficult at all  Depression Treatment Depression Interventions/Treatment : EYV7-0 Score <4 Follow-up Not Indicated     Goals Addressed             This Visit's Progress    DIET - EAT MORE FRUITS AND VEGETABLES               Objective:    There were no vitals filed for this visit. There is no height or weight  on file to calculate BMI.  Hearing/Vision screen Hearing Screening - Comments:: NO AIDS Vision Screening - Comments:: NEEDS GLASSES Immunizations and Health Maintenance Health Maintenance  Topic Date Due   Zoster Vaccines- Shingrix (1 of 2) 04/26/1967   OPHTHALMOLOGY EXAM  07/30/2020   Influenza Vaccine  04/18/2024   DTaP/Tdap/Td (2 - Td or Tdap) 05/13/2025 (Originally 08/04/2020)   HEMOGLOBIN A1C  11/13/2024   Diabetic kidney evaluation - eGFR measurement  05/13/2025   Diabetic kidney evaluation - Urine ACR  05/13/2025   FOOT EXAM  05/13/2025   Medicare Annual Wellness (AWV)  09/05/2025   Pneumococcal Vaccine: 50+ Years  Completed   Bone Density Scan  Completed   Hepatitis C Screening  Completed   Meningococcal B Vaccine  Aged Out   Mammogram  Discontinued   Colonoscopy  Discontinued   COVID-19 Vaccine  Discontinued        Assessment/Plan:  This is a routine wellness examination for Kendra [name].  Patient Care Team: Antonette Angeline ORN, NP as PCP - General (Internal Medicine) Glenard Mire, MD as Attending Physician (Family Medicine) Dellie Louanne MATSU, MD (General Surgery) Ezzard Rolin BIRCH, LCSW as Social Worker (Licensed Clinical Social Worker)  I have  personally reviewed and noted the following in the patients chart:   Medical and social history Use of alcohol, tobacco or illicit drugs  Current medications and supplements including opioid prescriptions. Functional ability and status Nutritional status Physical activity Advanced directives List of other physicians Hospitalizations, surgeries, and ER visits in previous 12 months Vitals Screenings to include cognitive, depression, and falls Referrals and appointments  No orders of the defined types were placed in this encounter.  In addition, I have reviewed and discussed with patient certain preventive protocols, quality metrics, and best practice recommendations. A written personalized care plan for preventive services as well as general preventive health recommendations were provided to patient.   Jhonnie GORMAN Das, LPN   [id number]   Return in 1 year (on 09/05/2025).  After Visit Summary: (MyChart) Due to this being a telephonic visit, the after visit summary with patients personalized plan was offered to patient via MyChart   Nurse Notes: NEEDS FLU, SHINGRIX; AGED OUT OF MAMMOGRAM & COLONOSCOPY; BDS ORDERED   "

## 2024-09-05 NOTE — Telephone Encounter (Signed)
 Requested medication (s) are due for refill today: yes  Requested medication (s) are on the active medication list: yes  Last refill:  05/14/24  Future visit scheduled: yes  Notes to clinic:   routing for the review of Sig     Requested Prescriptions  Pending Prescriptions Disp Refills   atorvastatin  (LIPITOR) 40 MG tablet [Pharmacy Med Name: ATORVASTATIN  CALCIUM  40 MG TAB] 90 tablet 0    Sig: TAKE 1 TABLET BY MOUTH AT BEDTIME     Cardiovascular:  Antilipid - Statins Failed - 09/05/2024  2:48 PM      Failed - Lipid Panel in normal range within the last 12 months    Cholesterol, Total  Date Value Ref Range Status  06/07/2015 177 100 - 199 mg/dL Final   Cholesterol  Date Value Ref Range Status  05/13/2024 185 <200 mg/dL Final  87/83/7984 858 0 - 200 mg/dL Final   Ldl Cholesterol, Calc  Date Value Ref Range Status  09/02/2014 73 0 - 100 mg/dL Final   LDL Cholesterol (Calc)  Date Value Ref Range Status  05/13/2024 108 (H) mg/dL (calc) Final    Comment:    Reference range: <100 . Desirable range <100 mg/dL for primary prevention;   <70 mg/dL for patients with CHD or diabetic patients  with > or = 2 CHD risk factors. SABRA LDL-C is now calculated using the Martin-Hopkins  calculation, which is a validated novel method providing  better accuracy than the Friedewald equation in the  estimation of LDL-C.  Gladis APPLETHWAITE et al. SANDREA. 7986;689(80): 2061-2068  (http://education.[facility].com/faq/FAQ164)    HDL Cholesterol  Date Value Ref Range Status  09/02/2014 37 (L) 40 - 60 mg/dL Final   HDL  Date Value Ref Range Status  05/13/2024 52 > OR = 50 mg/dL Final  90/80/7983 56 >60 mg/dL Final    Comment:    According to ATP-III Guidelines, HDL-C >59 mg/dL is considered a negative risk factor for CHD.    Triglycerides  Date Value Ref Range Status  05/13/2024 140 <150 mg/dL Final  87/83/7984 844 0 - 200 mg/dL Final         Passed - Patient is not pregnant       Passed - Valid encounter within last 12 months    Recent Outpatient Visits           3 months ago Encounter for general adult medical examination with abnormal findings   [facility] [place], Angeline ORN, NP               metFORMIN  (GLUCOPHAGE ) 500 MG tablet [Pharmacy Med Name: METFORMIN  HCL 500 MG TAB] 325 tablet 0    Sig: TAKE 1 TABLET BY MOUTH DAILY WITH BREAKFAST FOR 7 DAYS, THEN 1 TABLET TWICE A DAY FOR 7 DAYS THEN 2 TAB TWICE A DAY. TAKE WITH A MEAL     Endocrinology:  Diabetes - Biguanides Failed - 09/05/2024  2:48 PM      Failed - HBA1C is between 0 and 7.9 and within 180 days    Hemoglobin A1C  Date Value Ref Range Status  09/02/2014 9.5 (H) 4.2 - 6.3 % Final    Comment:    The American Diabetes Association recommends that a primary goal of therapy should be <7% and that physicians should reevaluate the treatment regimen in patients with HbA1c values consistently >8%.    Hgb A1c MFr Bld  Date Value Ref Range Status  05/13/2024 8.1 (H) <5.7 %  Final    Comment:    For someone without known diabetes, a hemoglobin A1c value of 6.5% or greater indicates that they may have  diabetes and this should be confirmed with a follow-up  test. . For someone with known diabetes, a value <7% indicates  that their diabetes is well controlled and a value  greater than or equal to 7% indicates suboptimal  control. A1c targets should be individualized based on  duration of diabetes, age, comorbid conditions, and  other considerations. . Currently, no consensus exists regarding use of hemoglobin A1c for diagnosis of diabetes for children. .          Failed - B12 Level in normal range and within 720 days    Vitamin B-12  Date Value Ref Range Status  08/29/2022 277 180 - 914 pg/mL Final    Comment:    (NOTE) This assay is not validated for testing neonatal or myeloproliferative syndrome specimens for Vitamin B12 levels. Performed at [facility], [address] [place], [place] [address]          Failed - CBC within normal limits and completed in the last 12 months    WBC  Date Value Ref Range Status  05/13/2024 4.7 3.8 - 10.8 Thousand/uL Final   RBC  Date Value Ref Range Status  05/13/2024 4.03 3.80 - 5.10 Million/uL Final   Hemoglobin  Date Value Ref Range Status  05/13/2024 12.3 11.7 - 15.5 g/dL Final   HGB  Date Value Ref Range Status  09/01/2014 12.7 12.0 - 16.0 g/dL Final   HCT  Date Value Ref Range Status  05/13/2024 37.1 35.0 - 45.0 % Final  09/01/2014 37.9 35.0 - 47.0 % Final   MCHC  Date Value Ref Range Status  05/13/2024 33.2 32.0 - 36.0 g/dL Final    Comment:    For adults, a slight decrease in the calculated MCHC value (in the range of 30 to 32 g/dL) is most likely not clinically significant; however, it should be interpreted with caution in correlation with other red cell parameters and the patient's clinical condition.    [facility]  Date Value Ref Range Status  05/13/2024 30.5 27.0 - 33.0 pg Final   MCV  Date Value Ref Range Status  05/13/2024 92.1 80.0 - 100.0 fL Final  09/01/2014 92 80 - 100 fL Final   No results found for: PLTCOUNTKUC, LABPLAT, POCPLA RDW  Date Value Ref Range Status  05/13/2024 13.1 11.0 - 15.0 % Final  09/01/2014 13.9 11.5 - 14.5 % Final         Passed - Cr in normal range and within 360 days    Creat  Date Value Ref Range Status  05/13/2024 0.88 0.60 - 1.00 mg/dL Final   Creatinine, Urine  Date Value Ref Range Status  05/13/2024 250 20 - 275 mg/dL Final         Passed - eGFR in normal range and within 360 days    GFR, Est African American  Date Value Ref Range Status  06/14/2020 82 > OR = 60 mL/min/1.31m2 Final   GFR, Est Non African American  Date Value Ref Range Status  06/14/2020 70 > OR = 60 mL/min/1.60m2 Final   GFR, Estimated  Date Value Ref Range Status  11/26/2023 >60 >60 mL/min Final    Comment:    (NOTE) Calculated using  the CKD-EPI Creatinine Equation (2021)    eGFR  Date Value Ref Range Status  05/13/2024 68 >  OR = 60 mL/min/1.5m2 Final         Passed - Valid encounter within last 6 months    Recent Outpatient Visits           3 months ago Encounter for general adult medical examination with abnormal findings   [facility], Angeline ORN, [place]

## 2024-09-09 ENCOUNTER — Other Ambulatory Visit: Payer: Self-pay | Admitting: Internal Medicine

## 2024-09-09 ENCOUNTER — Encounter
Admission: RE | Admit: 2024-09-09 | Discharge: 2024-09-09 | Disposition: A | Discharge status: Home / Self Care | Source: Ambulatory Visit | Attending: Surgery | Admitting: Surgery

## 2024-09-09 DIAGNOSIS — I10 Essential (primary) hypertension: Secondary | ICD-10-CM

## 2024-09-09 DIAGNOSIS — Z1231 Encounter for screening mammogram for malignant neoplasm of breast: Secondary | ICD-10-CM

## 2024-09-09 DIAGNOSIS — E119 Type 2 diabetes mellitus without complications: Secondary | ICD-10-CM | POA: Insufficient documentation

## 2024-09-09 DIAGNOSIS — E118 Type 2 diabetes mellitus with unspecified complications: Secondary | ICD-10-CM

## 2024-09-09 DIAGNOSIS — Z01818 Encounter for other preprocedural examination: Secondary | ICD-10-CM | POA: Insufficient documentation

## 2024-09-09 DIAGNOSIS — Z01812 Encounter for preprocedural laboratory examination: Secondary | ICD-10-CM

## 2024-09-09 DIAGNOSIS — Z01810 Encounter for preprocedural cardiovascular examination: Secondary | ICD-10-CM

## 2024-09-09 DIAGNOSIS — I444 Left anterior fascicular block: Secondary | ICD-10-CM | POA: Insufficient documentation

## 2024-09-09 DIAGNOSIS — Z780 Asymptomatic menopausal state: Secondary | ICD-10-CM

## 2024-09-09 LAB — CBC
HCT: 37.7 % (ref 36.0–46.0)
Hemoglobin: 12.4 g/dL (ref 12.0–15.0)
MCH: 30.5 pg (ref 26.0–34.0)
MCHC: 32.9 g/dL (ref 30.0–36.0)
MCV: 92.6 fL (ref 80.0–100.0)
Platelets: 235 K/uL (ref 150–400)
RBC: 4.07 MIL/uL (ref 3.87–5.11)
RDW: 12.9 % (ref 11.5–15.5)
WBC: 4.5 K/uL (ref 4.0–10.5)
nRBC: 0 % (ref 0.0–0.2)

## 2024-09-09 LAB — BASIC METABOLIC PANEL WITH GFR
Anion gap: 10 (ref 5–15)
BUN: 13 mg/dL (ref 8–23)
CO2: 27 mmol/L (ref 22–32)
Calcium: 9.5 mg/dL (ref 8.9–10.3)
Chloride: 104 mmol/L (ref 98–111)
Creatinine, Ser: 0.8 mg/dL (ref 0.44–1.00)
GFR, Estimated: >60 mL/min
Glucose, Bld: 174 mg/dL — ABNORMAL HIGH (ref 70–99)
Potassium: 3.8 mmol/L (ref 3.5–5.1)
Sodium: 140 mmol/L (ref 135–145)

## 2024-09-09 LAB — HEMOGLOBIN A1C
Hgb A1c MFr Bld: 8 % — ABNORMAL HIGH (ref 4.8–5.6)
Mean Plasma Glucose: 182.9 mg/dL

## 2024-09-16 ENCOUNTER — Telehealth: Payer: Self-pay

## 2024-09-16 ENCOUNTER — Other Ambulatory Visit: Payer: Self-pay | Admitting: Internal Medicine

## 2024-09-16 DIAGNOSIS — J449 Chronic obstructive pulmonary disease, unspecified: Secondary | ICD-10-CM

## 2024-09-16 MED ORDER — SODIUM CHLORIDE 0.9 % IV SOLN: INTRAVENOUS | Status: DC

## 2024-09-16 MED ORDER — ORAL CARE MOUTH RINSE: 15.0000 mL | Freq: Once | OROMUCOSAL | Status: AC

## 2024-09-16 MED ORDER — CHLORHEXIDINE GLUCONATE 0.12 % MT SOLN
15.0000 mL | Freq: Once | OROMUCOSAL | Status: AC
  Administered 2024-09-17: 15 mL via OROMUCOSAL

## 2024-09-16 MED ORDER — CEFAZOLIN SODIUM-DEXTROSE 2-4 GM/100ML-% IV SOLN: 2.0000 g | INTRAVENOUS | Status: DC

## 2024-09-16 NOTE — Telephone Encounter (Signed)
 Spoke to patient, she will wait to hear from care management team. Explained to her this could take a little longer then usual given the holidays and time off.

## 2024-09-16 NOTE — Addendum Note (Signed)
 Encounter addended by: Quierra Silverio L on: 09/16/2024 10:25 AM  Actions taken: Imaging Exam ended

## 2024-09-16 NOTE — Telephone Encounter (Signed)
 A referral was placed on 12/19 to the [facility] care management team regarding getting help with the mailbox closer to her door.  Typically, they will reach out to the patient within 30 days.

## 2024-09-16 NOTE — Telephone Encounter (Signed)
 Copied from CRM #[id number]. Topic: General - Other >> Sep 16, 2024  3:00 PM Amy B wrote: Reason for CRM: Patient requests a call back from [facility] regarding obtaining a mail box for her home.  She states she is unable to walk to her mail box and that she has discuss this with her in the past.  Please call [phone]

## 2024-09-17 ENCOUNTER — Ambulatory Visit: Payer: Self-pay | Admitting: Urgent Care

## 2024-09-17 ENCOUNTER — Other Ambulatory Visit: Payer: Self-pay

## 2024-09-17 ENCOUNTER — Telehealth: Payer: Self-pay

## 2024-09-17 ENCOUNTER — Encounter
Admission: RE | Disposition: A | Discharge status: Home / Self Care | Payer: Self-pay | Source: Home / Self Care | Attending: Surgery

## 2024-09-17 ENCOUNTER — Ambulatory Visit

## 2024-09-17 ENCOUNTER — Encounter: Payer: Self-pay | Admitting: Surgery

## 2024-09-17 ENCOUNTER — Ambulatory Visit
Admission: RE | Admit: 2024-09-17 | Discharge: 2024-09-17 | Disposition: A | Discharge status: Home / Self Care | Source: Home / Self Care | Attending: Surgery | Admitting: Surgery

## 2024-09-17 DIAGNOSIS — F17210 Nicotine dependence, cigarettes, uncomplicated: Secondary | ICD-10-CM | POA: Diagnosis not present

## 2024-09-17 DIAGNOSIS — I10 Essential (primary) hypertension: Secondary | ICD-10-CM | POA: Insufficient documentation

## 2024-09-17 DIAGNOSIS — Z01812 Encounter for preprocedural laboratory examination: Secondary | ICD-10-CM

## 2024-09-17 DIAGNOSIS — G5601 Carpal tunnel syndrome, right upper limb: Secondary | ICD-10-CM | POA: Diagnosis present

## 2024-09-17 DIAGNOSIS — E1141 Type 2 diabetes mellitus with diabetic mononeuropathy: Secondary | ICD-10-CM | POA: Diagnosis not present

## 2024-09-17 DIAGNOSIS — J4489 Other specified chronic obstructive pulmonary disease: Secondary | ICD-10-CM | POA: Diagnosis not present

## 2024-09-17 DIAGNOSIS — E119 Type 2 diabetes mellitus without complications: Secondary | ICD-10-CM

## 2024-09-17 DIAGNOSIS — F32A Depression, unspecified: Secondary | ICD-10-CM | POA: Insufficient documentation

## 2024-09-17 DIAGNOSIS — Z7984 Long term (current) use of oral hypoglycemic drugs: Secondary | ICD-10-CM | POA: Diagnosis not present

## 2024-09-17 DIAGNOSIS — Z79899 Other long term (current) drug therapy: Secondary | ICD-10-CM | POA: Diagnosis not present

## 2024-09-17 DIAGNOSIS — G709 Myoneural disorder, unspecified: Secondary | ICD-10-CM | POA: Insufficient documentation

## 2024-09-17 DIAGNOSIS — Z8673 Personal history of transient ischemic attack (TIA), and cerebral infarction without residual deficits: Secondary | ICD-10-CM | POA: Diagnosis not present

## 2024-09-17 DIAGNOSIS — J432 Centrilobular emphysema: Secondary | ICD-10-CM

## 2024-09-17 DIAGNOSIS — K219 Gastro-esophageal reflux disease without esophagitis: Secondary | ICD-10-CM | POA: Insufficient documentation

## 2024-09-17 HISTORY — PX: CARPAL TUNNEL RELEASE: SHX101

## 2024-09-17 LAB — GLUCOSE, CAPILLARY
Glucose-Capillary: 130 mg/dL — ABNORMAL HIGH (ref 70–99)
Glucose-Capillary: 122 mg/dL — ABNORMAL HIGH (ref 70–99)

## 2024-09-17 SURGERY — RELEASE, CARPAL TUNNEL, ENDOSCOPIC
Anesthesia: General | Site: Wrist | Laterality: Right

## 2024-09-17 MED ORDER — OXYCODONE HCL 5 MG/5ML PO SOLN: 5.0000 mg | Freq: Once | ORAL | Status: AC | PRN

## 2024-09-17 MED ORDER — GLYCOPYRROLATE 0.2 MG/ML IJ SOLN
INTRAMUSCULAR | Status: DC | PRN
  Administered 2024-09-17: .2 mg via INTRAVENOUS

## 2024-09-17 MED ORDER — TRAMADOL HCL 50 MG PO TABS: 50.0000 mg | ORAL_TABLET | Freq: Four times a day (QID) | ORAL | 0 refills | Status: AC | PRN

## 2024-09-17 MED ORDER — 0.9 % SODIUM CHLORIDE (POUR BTL) OPTIME
TOPICAL | Status: DC | PRN
  Administered 2024-09-17: 500 mL

## 2024-09-17 MED ORDER — PHENYLEPHRINE 80 MCG/ML (10ML) SYRINGE FOR IV PUSH (FOR BLOOD PRESSURE SUPPORT)
PREFILLED_SYRINGE | INTRAVENOUS | Status: AC
  Filled 2024-09-17: qty 10

## 2024-09-17 MED ORDER — EPHEDRINE 5 MG/ML INJ
INTRAVENOUS | Status: AC
  Filled 2024-09-17: qty 5

## 2024-09-17 MED ORDER — FENTANYL CITRATE (PF) 100 MCG/2ML IJ SOLN
INTRAMUSCULAR | Status: DC | PRN
  Administered 2024-09-17 (×2): 50 ug via INTRAVENOUS

## 2024-09-17 MED ORDER — TRELEGY ELLIPTA 100-62.5-25 MCG/ACT IN AEPB: 1.0000 | INHALATION_SPRAY | Freq: Every evening | RESPIRATORY_TRACT | Status: AC | PRN

## 2024-09-17 MED ORDER — FENTANYL CITRATE (PF) 100 MCG/2ML IJ SOLN
INTRAMUSCULAR | Status: AC
  Filled 2024-09-17: qty 2

## 2024-09-17 MED ORDER — CHLORHEXIDINE GLUCONATE 0.12 % MT SOLN
OROMUCOSAL | Status: AC
  Filled 2024-09-17: qty 15

## 2024-09-17 MED ORDER — DEXAMETHASONE SOD PHOSPHATE PF 10 MG/ML IJ SOLN
INTRAMUSCULAR | Status: DC | PRN
  Administered 2024-09-17: 5 mg via INTRAVENOUS

## 2024-09-17 MED ORDER — CEFAZOLIN SODIUM-DEXTROSE 2-4 GM/100ML-% IV SOLN
INTRAVENOUS | Status: AC
  Filled 2024-09-17: qty 100

## 2024-09-17 MED ORDER — ONDANSETRON HCL 4 MG/2ML IJ SOLN
INTRAMUSCULAR | Status: DC | PRN
  Administered 2024-09-17: 4 mg via INTRAVENOUS

## 2024-09-17 MED ORDER — FENTANYL CITRATE (PF) 100 MCG/2ML IJ SOLN: 25.0000 ug | INTRAMUSCULAR | Status: DC | PRN

## 2024-09-17 MED ORDER — KETOROLAC TROMETHAMINE 15 MG/ML IJ SOLN
INTRAMUSCULAR | Status: AC
  Filled 2024-09-17: qty 1

## 2024-09-17 MED ORDER — EPHEDRINE SULFATE-NACL 50-0.9 MG/10ML-% IV SOSY
PREFILLED_SYRINGE | INTRAVENOUS | Status: DC | PRN
  Administered 2024-09-17: 5 mg via INTRAVENOUS

## 2024-09-17 MED ORDER — KETOROLAC TROMETHAMINE 15 MG/ML IJ SOLN
15.0000 mg | Freq: Once | INTRAMUSCULAR | Status: AC
  Administered 2024-09-17: 15 mg via INTRAVENOUS

## 2024-09-17 MED ORDER — OXYCODONE HCL 5 MG PO TABS
ORAL_TABLET | ORAL | Status: AC
  Filled 2024-09-17: qty 1

## 2024-09-17 MED ORDER — OXYCODONE HCL 5 MG PO TABS
5.0000 mg | ORAL_TABLET | Freq: Once | ORAL | Status: AC | PRN
  Administered 2024-09-17: 5 mg via ORAL

## 2024-09-17 MED ORDER — MIDAZOLAM HCL 2 MG/2ML IJ SOLN
INTRAMUSCULAR | Status: AC
  Filled 2024-09-17: qty 2

## 2024-09-17 MED ORDER — GLYCOPYRROLATE 0.2 MG/ML IJ SOLN
INTRAMUSCULAR | Status: AC
  Filled 2024-09-17: qty 1

## 2024-09-17 MED ORDER — LIDOCAINE HCL (CARDIAC) PF 100 MG/5ML IV SOSY
PREFILLED_SYRINGE | INTRAVENOUS | Status: DC | PRN
  Administered 2024-09-17: 60 mg via INTRAVENOUS

## 2024-09-17 MED ORDER — MIDAZOLAM HCL (PF) 2 MG/2ML IJ SOLN
INTRAMUSCULAR | Status: DC | PRN
  Administered 2024-09-17: 1 mg via INTRAVENOUS

## 2024-09-17 MED ORDER — BUPIVACAINE HCL (PF) 0.5 % IJ SOLN
INTRAMUSCULAR | Status: DC | PRN
  Administered 2024-09-17: 10 mL

## 2024-09-17 MED ORDER — PROPOFOL 10 MG/ML IV BOLUS
INTRAVENOUS | Status: DC | PRN
  Administered 2024-09-17: 100 mg via INTRAVENOUS

## 2024-09-17 SURGICAL SUPPLY — 31 items
BNDG COHESIVE 4X5 TAN STRL LF (GAUZE/BANDAGES/DRESSINGS) ×1 IMPLANT
BNDG ELASTIC 2INX 5YD STR LF (GAUZE/BANDAGES/DRESSINGS) ×1 IMPLANT
BNDG ESMARCH 4X12 STRL LF (GAUZE/BANDAGES/DRESSINGS) ×1 IMPLANT
CHLORAPREP W/TINT 26 (MISCELLANEOUS) ×1 IMPLANT
CORD BIP STRL DISP 12FT (MISCELLANEOUS) ×1 IMPLANT
CUFF TOURN SGL QUICK 18X4 (TOURNIQUET CUFF) ×1 IMPLANT
DRAPE SURG 17X11 SM STRL (DRAPES) ×1 IMPLANT
FORCEPS JEWEL BIP 4-3/4 STR (INSTRUMENTS) ×1 IMPLANT
GAUZE SPONGE 4X4 12PLY STRL (GAUZE/BANDAGES/DRESSINGS) ×1 IMPLANT
GAUZE XEROFORM 1X8 LF (GAUZE/BANDAGES/DRESSINGS) ×1 IMPLANT
GLOVE BIO SURGEON STRL SZ8 (GLOVE) ×1 IMPLANT
GLOVE BIOGEL PI IND STRL 8 (GLOVE) ×1 IMPLANT
GOWN STRL REUS W/ TWL LRG LVL3 (GOWN DISPOSABLE) ×1 IMPLANT
GOWN STRL REUS W/ TWL XL LVL3 (GOWN DISPOSABLE) ×1 IMPLANT
KIT ESCP INSRT D SLOT CANN KN (MISCELLANEOUS) ×1 IMPLANT
KIT TURNOVER KIT A (KITS) ×1 IMPLANT
MANIFOLD NEPTUNE II (INSTRUMENTS) ×1 IMPLANT
NS IRRIG 500ML POUR BTL (IV SOLUTION) ×1 IMPLANT
PACK EXTREMITY ARMC (MISCELLANEOUS) ×1 IMPLANT
PENCIL SMOKE EVACUATOR (MISCELLANEOUS) ×1 IMPLANT
SOLN STERILE WATER 500 ML (IV SOLUTION) IMPLANT
SPLINT LT WRIST LG 8.5 (SOFTGOODS) IMPLANT
SPLINT LT WRIST MD 8 (SOFTGOODS) IMPLANT
SPLINT LT WRIST XL 8.5+ LP LCK (SOFTGOODS) IMPLANT
SPLINT RT WRIST LG 8.5 (SOFTGOODS) IMPLANT
SPLINT RT WRIST MD 8 (SOFTGOODS) IMPLANT
SPLINT RT WRIST XL 8.5+ (SOFTGOODS) IMPLANT
SPLINT WRIST LG RT TX900304 (SOFTGOODS) IMPLANT
STOCKINETTE IMPERVIOUS 9X36 MD (GAUZE/BANDAGES/DRESSINGS) ×1 IMPLANT
SUT PROLENE 4 0 PS 2 18 (SUTURE) ×1 IMPLANT
TRAP FLUID SMOKE EVACUATOR (MISCELLANEOUS) IMPLANT

## 2024-09-17 NOTE — H&P (Signed)
 History of Present Illness:  Kendra [name] is a 78 y.o. female who presents today for her surgical history and physical for upcoming right endoscopic carpal tunnel release scheduled with Dr. Edie on 07/23/24. The patient denies any changes in her medical history since she was last evaluated. She denies any trauma or injury affecting her right wrist since her last visit. She continues to experience some burning and tingling discomfort on the right arm and does have decree strength of the right upper extremity. The patient does have severe carpal tunnel documented via a nerve conduction study. The patient does have a history of COPD in addition to diabetes. No previous history of DVT. The patient denies any personal history of heart attack or stroke.  Current Outpatient Medications:  acetaminophen  (TYLENOL ) 650 MG ER tablet Take 2 capsules by mouth in the morning, at noon, in the evening, and at bedtime.  aspirin  81 MG chewable tablet Take 81 mg by mouth once daily  aspirin  81 MG EC tablet Take by mouth Take 1 tablet (81 mg total) by mouth daily. Swallow whole.  atorvastatin  (LIPITOR) 40 MG tablet TAKE 1 TABLET BY MOUTH BEDTIME  celecoxib (CELEBREX) 200 MG capsule Take 1 capsule (200 mg total) by mouth once daily 60 capsule 2  metFORMIN  (GLUCOPHAGE ) 500 MG tablet  ACCU-CHEK SOFTCLIX LANCETS lancets (Patient not taking: Reported on 06/20/2024)  albuterol  (VENTOLIN  HFA) 90 mcg/actuation inhaler Inhale 2 inhalations into the lungs INHALE 1-2 PUFFS BY MOUTH EVERY 6 HOURS AS NEEDED WHEEZING/ SHORTNESS OF BREATH (Patient not taking: Reported on 06/20/2024)  buPROPion  (WELLBUTRIN  XL) 150 MG XL tablet Take 1 tablet by mouth once daily (Patient not taking: Reported on 06/20/2024)  clopidogreL  (PLAVIX ) 75 mg tablet Take by mouth Take 1 tablet (75 mg total) by mouth daily. (Patient not taking: Reported on 06/20/2024)  cyclobenzaprine  (FLEXERIL ) 5 MG tablet Take 5 mg by mouth 3 (three) times daily as needed (Patient  not taking: Reported on 06/20/2024)  diazePAM (VALIUM) 5 MG tablet Take 1 tablet 30 minutes prior to MRI scan as needed for anxiety (Patient not taking: Reported on 06/20/2024) 1 tablet 0  fluticasone -umeclidinium-vilanterol (TRELEGY ELLIPTA ) 100-62.5-25 mcg inhaler USE ONE INHALATION INTO THE LUNGS ONCE A DAY RINSE MOUTH AFTER EACH USE (Patient not taking: Reported on 06/20/2024)  gabapentin  (NEURONTIN ) 100 MG capsule Take 1 capsule (100 mg total) by mouth 2 (two) times daily (Patient not taking: Reported on 06/20/2024) 60 capsule 0  ibuprofen  (MOTRIN ) 600 MG tablet (Patient not taking: Reported on 06/20/2024)  ketoconazole (NIZORAL) 2 % cream (Patient not taking: Reported on 06/20/2024)  lisinopriL  (ZESTRIL ) 5 MG tablet (Patient not taking: Reported on 06/20/2024)  methocarbamoL  (ROBAXIN ) 500 MG tablet Take 1 tablet (500 mg total) by mouth 3 (three) times daily (Patient not taking: Reported on 06/20/2024) 30 tablet 2  nicotine  (NICOTROL ) 10 mg cartridge for inhalation (Patient not taking: Reported on 06/20/2024)  nicotine  polacrilex (NICORETTE ) 4 MG gum Take 4 mg by mouth (Patient not taking: Reported on 06/20/2024)  ondansetron  (ZOFRAN ) 4 MG tablet (Patient not taking: Reported on 06/20/2024)  oxyCODONE  (ROXICODONE ) 5 MG immediate release tablet Take 1 tablet (5 mg total) by mouth every 4 (four) hours as needed (Patient not taking: Reported on 06/20/2024) 30 tablet 0  pravastatin (PRAVACHOL) 20 MG tablet Take 1 tablet by mouth once daily (Patient not taking: Reported on 06/20/2024)  silver  sulfADIAZINE  (SSD) 1 % cream Apply topically (Patient not taking: Reported on 06/20/2024)  SITagliptin  phos-metFORMIN  (JANUMET  XR) 100-1,000 mg ER 24  hr multiphase tablet (Patient not taking: Reported on 06/20/2024)  SITagliptin  phos-metFORMIN  (JANUMET  XR) 50-1,000 mg ER 24 hr mutliphase tablet  traMADoL  (ULTRAM ) 50 mg tablet Take 1 tablet by mouth every 6 (six) hours as needed (Patient not taking: Reported on 06/20/2024)   traZODone  (DESYREL ) 50 MG tablet Take 50 mg by mouth at bedtime (Patient not taking: Reported on 06/20/2024)  umeclidinium (INCRUSE ELLIPTA ) 62.5 mcg/actuation DsDv inhalation unit (Patient not taking: Reported on 06/20/2024)  vancomycin (VANCOCIN) 250 MG capsule (Patient not taking: Reported on 06/20/2024)  zolpidem  (AMBIEN ) 5 MG tablet (Patient not taking: Reported on 06/20/2024)   Allergies:  Citalopram  Nausea And Vomiting   Past Medical History:  Asthma without status asthmaticus (HHS-HCC)  Cataract cortical, senile  COPD (chronic obstructive pulmonary disease) (CMS/HHS-HCC)  Depression  Diabetes mellitus type 2, uncomplicated (CMS/HHS-HCC)  Glaucoma (increased eye pressure)  History of stroke  2015 and 2017- weakness in the left leg  Hyperlipidemia  Hypertension  Neuropathy  Pneumonia 2018  Rheumatoid arthritis (CMS/HHS-HCC)   Past Surgical History:  ACDF 12/15/2008 C5-6, C6-7 (Diskectomy at C5-6 and C6-7. Insertion of interbody device BAK-C at C5-6. Insertion of interbody device BAK-C at C6-7)  ENDARTERECTOMY CAROTID RIGHT. Patch Angioplasty Using XenoSure Biologic Patch Right Carotid. Right 10/21/2019  Surgeon: Sheree Penne Bruckner, MD  LEFT CAROTID ENDARTERECTOMY. Patch Angioplasty Left Carotid. Left 12/30/2019 (Surgeon: Sheree Penne Bruckner MD)  Reverse right total shoulder arthroplasty with biceps tenodesis 04/10/2023 (Dr. Edie)  BREAST EXCISIONAL BIOPSY Right (Fatty Tumor)  LEFT WRIST SURGERY  RIGHT RING FINGER   Family History:  Hyperlipidemia (Elevated cholesterol) Mother  Diabetes Mother  High blood pressure (Hypertension) Mother  Cataracts Mother  Breast cancer Mother  Diabetes type II Mother  Depression Father  Stroke Father  Heart murmur Son   Social History:   Socioeconomic History:  Marital status: Widowed  Tobacco Use  Smoking status: Every Day  Current packs/day: 0.25  Types: Cigarettes  Smokeless tobacco: Never  Vaping Use  Vaping  status: Never Used  Substance and Sexual Activity  Alcohol use: No  Drug use: No  Sexual activity: Defer   Social Drivers of Health:   Physicist, Medical Strain: Low Risk (03/12/2024)  Overall Financial Resource Strain (CARDIA)  Difficulty of Paying Living Expenses: Not hard at all  Food Insecurity: Food Insecurity Present (03/12/2024)  Hunger Vital Sign  Worried About Running Out of Food in the Last Year: Sometimes true  Ran Out of Food in the Last Year: Sometimes true  Transportation Needs: No Transportation Needs (03/12/2024)  PRAPARE - Risk Analyst (Medical): No  Lack of Transportation (Non-Medical): No   Review of Systems:  A comprehensive 14 point ROS was performed, reviewed, and the pertinent orthopaedic findings are documented in the HPI.  Physical Exam: BP 112/76  Ht 152.4 cm (5')  Wt 61.3 kg (135 lb 3.2 oz)  BMI 26.40 kg/m  General/Constitutional: The patient appears to be well-nourished, well-developed, and in no acute distress. Neuro/Psych: Normal mood and affect, oriented to person, place and time. Eyes: Non-icteric. Pupils are equal, round, and reactive to light, and exhibit synchronous movement. ENT: Unremarkable. Lymphatic: No palpable adenopathy. Respiratory: Moderate wheeze, Normal chest excursion, and Non-labored breathing Cardiovascular: Regular rate and rhythm. No murmurs. and No edema, swelling or tenderness, except as noted in detailed exam. Integumentary: No impressive skin lesions present, except as noted in detailed exam. Musculoskeletal: Unremarkable, except as noted in detailed exam.  Neck: Neck has full range of motion. There is no tenderness  to palpation. Spurling's test is negative.   Right Upper Extremity: Normal shoulder contour. Good active and passive range of motion and stability of the shoulder, elbow, and wrist. Normal motion of the hand and digits. No swelling, erythema, or ecchymosis is noted. There is no  triggering or locking of the digits noted. No thenar atrophy is visualized. Moderate intrinsic wasting. The patient has a positive Phalen's test. The patient has a positive Tinel's test. The patient has decreased pinprick and light touch sensation in the median nerve distribution. There is decreased grip strength and pincer strength. The patient has less than 2 second capillary refill with good skin warmth. Normal radial and ulnar pulse is palpated.   Neurologic: Sensory function is intact, except as noted above. Motor strength is 5/5, except as noted above. No tremor or clonus is present. Good motor coordination is noted.   NCV/EMG Findings: The findings are most consistent with evidence of a right median mononeuropathy at or distal to the wrist, consistent with carpal tunnel syndrome, severe in degree electrically. The residuals of an old intraspinal canal lesion (ie: motor radiculopathy) at the right C5 root or segment, mild to moderate in degree electrically. Screening studies for right ulnar or radial mononeuropathies are normal.  Assessment: Right carpal tunnel syndrome.   Plan: 1. Treatment options were discussed today with the patient. 2. The patient is scheduled for a right endoscopic carpal tunnel release with Dr. Edie on 07/23/24. 3. The risk and benefits of a endoscopic right carpal tunnel release procedure were discussed today in clinic.  5. This document will serve as a surgical history and physical for the patient.  6. The patient will follow-up 2 weeks after surgery for suture removal. 6. She can contact the clinic if she has any questions, new symptoms develop or symptoms worsen.  The procedure was discussed with the patient, as were the potential risks (including bleeding, infection, nerve and/or blood vessel injury, persistent or recurrent pain, failure of the release, continued weakness, need for further surgery, blood clots, strokes, heart attacks and/or arhythmias, pneumonia,  etc.) and benefits. The patient states her understanding and wishes to proceed.    H&P reviewed and patient re-examined. No changes.

## 2024-09-17 NOTE — Anesthesia Procedure Notes (Signed)
 Procedure Name: LMA Insertion Date/Time: 09/17/2024 2:10 PM  Performed by: Veronica Alm BROCKS, CRNAPre-anesthesia Checklist: Patient identified, Emergency Drugs available, Suction available and Patient being monitored Patient Re-evaluated:Patient Re-evaluated prior to induction Oxygen Delivery Method: Circle system utilized Preoxygenation: Pre-oxygenation with 100% oxygen Induction Type: IV induction Ventilation: Mask ventilation without difficulty LMA Size: 3.0 Number of attempts: 1 Placement Confirmation: positive ETCO2, CO2 detector and breath sounds checked- equal and bilateral Tube secured with: Tape

## 2024-09-17 NOTE — Op Note (Signed)
 09/17/2024  2:45 PM  Patient:   Kendra [name]  Pre-Op Diagnosis:   Right carpal tunnel syndrome.  Post-Op Diagnosis:   Same.  Procedure:   Endoscopic right carpal tunnel release.  Surgeon:   DOROTHA Reyes Maltos, MD  Anesthesia:   General LMA  Findings:   As above.  Complications:   None  EBL:   0 cc  Fluids:   250 cc crystalloid  TT:   11 minutes at 250 mmHg  Drains:   None  Closure:   4-0 Prolene interrupted sutures  Brief Clinical Note:   The patient is a 78 year old female with a long history of progressive worsening pain and paresthesias to her right hand. Her symptoms have progressed despite medications, activity modification, etc. Her history and examination are consistent with carpal tunnel syndrome, confirmed by EMG. The patient presents at this time for an endoscopic right carpal tunnel release.   Procedure:   The patient was brought into the operating room and lain in the supine position. After adequate general laryngeal mask anesthesia was obtained, the right hand and upper extremity were prepped with ChloraPrep solution before being draped sterilely. Preoperative antibiotics were administered. A timeout was performed to verify the appropriate surgical site before the limb was exsanguinated with an Esmarch and the tourniquet inflated to 250 mmHg.   An approximately 1.5-2 cm incision was made over the volar wrist flexion crease, centered over the palmaris longus tendon. The incision was carried down through the subcutaneous tissues with care taken to identify and protect any neurovascular structures. The distal forearm fascia was penetrated just proximal to the transverse carpal ligament. The soft tissues were released off the superficial and deep surfaces of the distal forearm fascia and this was released proximally for 3-4 cm under direct visualization.  Attention was directed distally. The Therapist, nutritional was passed beneath the transverse carpal ligament along the  ulnar aspect of the carpal tunnel and used to release any adhesions as well as to remove any adherent synovial tissue before first the smaller then the larger of the two dilators were passed beneath the transverse carpal ligament along the ulnar margin of the carpal tunnel. The slotted cannula was introduced and the endoscope was placed into the slotted cannula and the undersurface of the transverse carpal ligament visualized. The distal margin of the transverse carpal ligament was marked by placing a 25-gauge needle percutaneously at Kaplan's cardinal point so that it entered the distal portion of the slotted cannula. Under endoscopic visualization, the transverse carpal ligament was released from proximal to distal using the end-cutting blade. A second pass was performed to ensure complete release of the ligament. The adequacy of release was verified both endoscopically and by palpation using the freer elevator.  The wound was irrigated thoroughly with sterile saline solution before being closed using 4-0 Prolene interrupted sutures. A total of 10 cc of 0.5% plain Sensorcaine  was injected in and around the incision before a sterile bulky dressing was applied to the wound. The patient was placed into a volar wrist splint before being awakened, extubated, and returned to the recovery room in satisfactory condition after tolerating the procedure well.

## 2024-09-17 NOTE — Progress Notes (Signed)
 Complex Care Management Note Care Guide Note  09/17/2024 Name: MARTIN SMEAL MRN: [id number] DOB: 1946/06/15   Complex Care Management Outreach Attempts: An unsuccessful telephone outreach was attempted today to offer the patient information about available complex care management services.  Follow Up Plan:  Additional outreach attempts will be made to offer the patient complex care management information and services.   Encounter Outcome:  No Answer  Jeoffrey Buffalo , RMA     [place]  [facility], [facility] Guide  Direct Dial: [phone]  Website: [email or web address]

## 2024-09-17 NOTE — Discharge Instructions (Addendum)
 Orthopedic discharge instructions: Keep dressing dry and intact. Keep hand elevated above heart level. May shower after dressing removed on postop day 4 (Sunday). Cover sutures with Band-Aids after drying off, then reapply Velcro splint. Apply ice to affected area frequently. Take ibuprofen 600 mg TID with meals for 3-5 days, then as necessary. Take ES Tylenol or pain medication as prescribed when needed.  Return for follow-up in 10-14 days or as scheduled.

## 2024-09-17 NOTE — Anesthesia Preprocedure Evaluation (Addendum)
 "                                  Anesthesia Evaluation  Patient identified by MRN, date of birth, ID band Patient awake    Reviewed: Allergy & Precautions, NPO status , Patient's Chart, lab work & pertinent test results  History of Anesthesia Complications Negative for: history of anesthetic complications  Airway Mallampati: III  TM Distance: >3 FB Neck ROM: full    Dental  (+) Edentulous Upper, Edentulous Lower   Pulmonary shortness of breath, asthma , COPD,  COPD inhaler, Current Smoker and Patient abstained from smoking.   Pulmonary exam normal        Cardiovascular hypertension, On Medications (-) angina Normal cardiovascular exam     Neuro/Psych  PSYCHIATRIC DISORDERS  Depression     Neuromuscular disease CVA, Residual Symptoms    GI/Hepatic Neg liver ROS,GERD  ,,  Endo/Other  diabetes, Type 2    Renal/GU      Musculoskeletal   Abdominal   Peds  Hematology  (+) Blood dyscrasia, anemia   Anesthesia Other Findings Past Medical History: No date: Anginal pain No date: Aortic atherosclerosis No date: Asthma No date: Back pain No date: Bladder tumor No date: Carotid artery stenosis No date: Carpal tunnel syndrome of right wrist No date: Cataract No date: Cervical disc disorder No date: COPD (chronic obstructive pulmonary disease) (HCC) No date: Depression No date: Diabetic neuropathy (HCC) No date: DM (diabetes mellitus), type 2 (HCC) No date: Dyspnea No date: Dyspnea on exertion No date: GERD (gastroesophageal reflux disease) No date: Hematuria No date: Hemiparesis affecting left side as late effect of  cerebrovascular accident (CVA) (HCC) No date: History of kidney stones No date: Hyperlipidemia No date: Hypertension No date: IDA (iron  deficiency anemia) No date: Insomnia No date: Neuropathy No date: Osteoarthritis No date: Osteopenia 2018: Pneumonia No date: Reflux No date: Rheumatoid arthritis (HCC) 2015: Stroke (HCC)      Comment:  and again 2017  - Weakness in left leg, staggers w/               walking, vision  No date: Tenosynovitis No date: Urothelial carcinoma of bladder (HCC) No date: Vitamin B 12 deficiency  Past Surgical History: 06/23/2022: BLADDER INSTILLATION; N/A     Comment:  Procedure: BLADDER INSTILLATION OF GEMCITABINE ;                Surgeon: Francisca Redell BROCKS, MD;  Location: [facility] ORS;                Service: Urology;  Laterality: N/A; No date: BREAST BIOPSY; Right     Comment:  Fatty Tumor 10/21/2019: ENDARTERECTOMY; Right     Comment:  Procedure: ENDARTERECTOMY CAROTID RIGHT;  Surgeon: Sheree Penne Bruckner, MD;  Location: [facility] OR;  Service:               Vascular;  Laterality: Right; 12/30/2019: ENDARTERECTOMY; Left     Comment:  Procedure: LEFT CAROTID ENDARTERECTOMY;  Surgeon: Sheree Penne Bruckner, MD;  Location: [facility] OR;  Service:               Vascular;  Laterality: Left; No date: NECK SURGERY No date: OVARIAN CYST REMOVAL 10/21/2019: PATCH ANGIOPLASTY; Right  Comment:  Procedure: Patch Angioplasty Using XenoSure Biologic               Patch Right Carotid;  Surgeon: Sheree Penne Bruckner,              MD;  Location: [facility] OR;  Service: Vascular;  Laterality:               Right; 12/30/2019: PATCH ANGIOPLASTY; Left     Comment:  Procedure: Patch Angioplasty Left Carotid;  Surgeon:               Sheree Penne Bruckner, MD;  Location: [facility] OR;                Service: Vascular;  Laterality: Left; 04/10/2023: REVERSE SHOULDER ARTHROPLASTY; Right     Comment:  Procedure: REVERSE SHOULDER ARTHROPLASTY WITH BICEPS               TENODESIS;  Surgeon: Edie Norleen PARAS, MD;  Location: [facility]               ORS;  Service: Orthopedics;  Laterality: Right;  MAKE               SECOND CASE OF THE DAY 06/23/2022: TRANSURETHRAL RESECTION OF BLADDER TUMOR; N/A     Comment:  Procedure: TRANSURETHRAL RESECTION OF BLADDER TUMOR               (TURBT);  Surgeon: Francisca Redell BROCKS, MD;  Location: [facility]               ORS;  Service: Urology;  Laterality: N/A;     Reproductive/Obstetrics negative OB ROS                              Anesthesia Physical Anesthesia Plan  ASA: 3  Anesthesia Plan: General LMA   Post-op Pain Management: Minimal or no pain anticipated   Induction: Intravenous  PONV Risk Score and Plan: 3 and Dexamethasone , Ondansetron , Midazolam  and Treatment may vary due to age or medical condition  Airway Management Planned: LMA  Additional Equipment:   Intra-op Plan:   Post-operative Plan: Extubation in OR  Informed Consent: I have reviewed the patients History and Physical, chart, labs and discussed the procedure including the risks, benefits and alternatives for the proposed anesthesia with the patient or authorized representative who has indicated his/her understanding and acceptance.     Dental Advisory Given  Plan Discussed with: Anesthesiologist, CRNA and Surgeon  Anesthesia Plan Comments: (Patient consented for risks of anesthesia including but not limited to:  - adverse reactions to medications - damage to eyes, teeth, lips or other oral mucosa - nerve damage due to positioning  - sore throat or hoarseness - Damage to heart, brain, nerves, lungs, other parts of body or loss of life  Patient voiced understanding and assent.)         Anesthesia Quick Evaluation  "

## 2024-09-17 NOTE — Transfer of Care (Signed)
 Immediate Anesthesia Transfer of Care Note  Patient: Kendra [name]  Procedure(s) Performed: RELEASE, CARPAL TUNNEL, ENDOSCOPIC (Right: Wrist)  Patient Location: PACU  Anesthesia Type:General  Level of Consciousness: sedated  Airway & Oxygen Therapy: Patient Spontanous Breathing and Patient connected to face mask oxygen  Post-op Assessment: Report given to RN  Post vital signs: Reviewed and stable  Last Vitals:  Vitals Value Taken Time  BP 98/63 09/17/24 14:45  Temp    Pulse 76 09/17/24 14:45  Resp 11 09/17/24 14:45  SpO2 100 % 09/17/24 14:45  Vitals shown include unfiled device data.  Last Pain:  Vitals:   09/17/24 1445  TempSrc:   PainSc: 2          Complications: No notable events documented.

## 2024-09-18 ENCOUNTER — Other Ambulatory Visit: Payer: Self-pay | Admitting: Internal Medicine

## 2024-09-18 DIAGNOSIS — I69354 Hemiplegia and hemiparesis following cerebral infarction affecting left non-dominant side: Secondary | ICD-10-CM

## 2024-09-18 NOTE — Telephone Encounter (Signed)
 Requested Prescriptions  Pending Prescriptions Disp Refills   albuterol  (VENTOLIN  HFA) 108 (90 Base) MCG/ACT inhaler [Pharmacy Med Name: ALBUTEROL  SULFATE HFA 108 (90 BASE)] 18 g 1    Sig: INHALE 1-2 PUFFS INTO THE LUNGS EVERY 6 HOURS AS NEEDED     Pulmonology:  Beta Agonists 2 Passed - 09/18/2024 10:18 AM      Passed - Last BP in normal range    BP Readings from Last 1 Encounters:  09/17/24 117/78         Passed - Last Heart Rate in normal range    Pulse Readings from Last 1 Encounters:  09/17/24 (!) 52         Passed - Valid encounter within last 12 months    Recent Outpatient Visits           4 months ago Encounter for general adult medical examination with abnormal findings   [place] [facility] [place], Angeline ORN, [place]

## 2024-09-19 ENCOUNTER — Encounter: Payer: Self-pay | Admitting: Surgery

## 2024-09-19 NOTE — Anesthesia Postprocedure Evaluation (Signed)
"   Anesthesia Post Note  Patient: Kendra [name]  Procedure(s) Performed: RELEASE, CARPAL TUNNEL, ENDOSCOPIC (Right: Wrist)  Patient location during evaluation: PACU Anesthesia Type: General Level of consciousness: awake and alert Pain management: pain level controlled Vital Signs Assessment: post-procedure vital signs reviewed and stable Respiratory status: spontaneous breathing, nonlabored ventilation, respiratory function stable and patient connected to nasal cannula oxygen Cardiovascular status: blood pressure returned to baseline and stable Postop Assessment: no apparent nausea or vomiting Anesthetic complications: no   No notable events documented.   Last Vitals:  Vitals:   09/17/24 1530 09/17/24 1547  BP: 119/68 117/78  Pulse: 76 (!) 52  Resp: 15 18  Temp: 36.4 C   SpO2: 99% 93%    Last Pain:  Vitals:   09/17/24 1547  TempSrc:   PainSc: 0-No pain                 Kendra [name]      "

## 2024-09-25 ENCOUNTER — Encounter: Payer: Self-pay | Admitting: Oncology

## 2024-09-25 NOTE — Progress Notes (Signed)
 Complex Care Management Note  Care Guide Note 09/25/2024 Name: Kendra [name] MRN: [id number] DOB: 1946-05-06  Kendra [name] is a 78 y.o. year old female who sees [name], Angeline ORN, NP for primary care. I reached out to Allia A Villard by phone today to offer complex care management services.  Kendra [name] was given information about Complex Care Management services today including:   The Complex Care Management services include support from the care team which includes your Nurse Care Manager, Clinical Social Worker, or Pharmacist.  The Complex Care Management team is here to help remove barriers to the health concerns and goals most important to you. Complex Care Management services are voluntary, and the patient may decline or stop services at any time by request to their care team member.   Complex Care Management Consent Status: Patient agreed to services and verbal consent obtained.   Follow up plan:  Telephone appointment with complex care management team member scheduled for:  BSW 09/26/2024 RNCM 09/29/2024  Encounter Outcome:  Patient Scheduled  Jeoffrey Buffalo , RMA     [place]  [facility], [name] Guide  Direct Dial: [phone]  Website: [email or web address]

## 2024-09-26 ENCOUNTER — Encounter: Payer: Self-pay | Admitting: Oncology

## 2024-09-26 ENCOUNTER — Other Ambulatory Visit: Payer: Self-pay

## 2024-09-26 NOTE — Patient Instructions (Signed)
 Visit Information  Thank you for taking time to visit with me today. Please don't hesitate to contact me if I can be of assistance to you before our next scheduled appointment.  Our next appointment is by telephone on 10/15/24 at 11am Please call the care guide team at [phone] if you need to cancel or reschedule your appointment.   Following is a copy of your care plan:   Goals Addressed             This Visit's Progress    BSW Goals       Current SDOH Barriers:  Cover and Pillow for home Food resources  Interventions: SW mailed resources for assistance with household items and food pantries.   Thersia Hoar, BSW, MHA [place]  [facility] Social Worker, Population Health [phone]           Please call the Suicide and Crisis Lifeline: 988 call the [place]  National Suicide Prevention Lifeline: 220 248 8584 or TTY: (325) 868-4817 TTY 805-299-3509) to talk to a trained counselor call 1-800-273-TALK (toll free, 24 hour hotline) call 911 if you are experiencing a Mental Health or Behavioral Health Crisis or need someone to talk to.  Patient verbalized understanding of Care plan and visit instructions communicated this visit  Thersia Hoar, BSW, [facility] [place]  [facility] Social Worker, Population Health [phone]

## 2024-09-26 NOTE — Patient Outreach (Signed)
 Social Drivers of Health  Community Resource and Care Coordination Visit Note   09/26/2024  Name: Kendra [name] MRN: [id number] DOB:[birth year]  Situation: Referral received for [facility] needs assessment and assistance related to food and household linens. I obtained verbal consent from Patient.  Visit completed with Patient on the phone.   Background:   SDOH Interventions Today    Flowsheet Row Most Recent Value  SDOH Interventions   Food Insecurity Interventions Community Resources Provided     Assessment:   Goals Addressed             This Visit's Progress    BSW Goals       Current SDOH Barriers:  Cover and Pillow for home Food resources  Interventions: SW mailed resources for assistance with household items and food pantries.   Thersia Hoar, HEDWIG, MHA [place]  [facility] Social Worker, Population Health [phone]           Recommendation:   Use resources sent via mail for assistance with food and household linens.  Follow Up Plan:   Telephone follow-up 10/16/23 at 11am  Thersia Hoar, BSW, [facility] [place]  Value Based [facility] Social Worker, Population Health [phone]

## 2024-09-28 ENCOUNTER — Encounter: Payer: Self-pay | Admitting: Surgery

## 2024-09-29 ENCOUNTER — Other Ambulatory Visit: Payer: Self-pay

## 2024-09-29 MED ORDER — LANCET DEVICE MISC: 1.0000 | 0 refills | Status: AC

## 2024-09-29 MED ORDER — BLOOD GLUCOSE MONITORING SUPPL DEVI: 1.0000 | 0 refills | Status: AC

## 2024-09-29 MED ORDER — BLOOD GLUCOSE TEST VI STRP: 1.0000 | ORAL_STRIP | 0 refills | Status: AC

## 2024-09-29 MED ORDER — LANCETS MISC: 1.0000 | 0 refills | Status: AC

## 2024-09-29 NOTE — Patient Outreach (Signed)
 Complex Care Management   Visit Note  09/29/2024  Name:  Kendra [name] MRN: [id number] DOB: 06-21-1946  Situation: Referral received for Complex Care Management related to Diabetes with Complications I obtained verbal consent from Patient.  Visit completed with Patient  on the phone  Background:   Past Medical History:  Diagnosis Date   Anginal pain    Aortic atherosclerosis    Asthma    Back pain    Bladder tumor    Carotid artery stenosis    Carpal tunnel syndrome of right wrist    Cataract    Cervical disc disorder    COPD (chronic obstructive pulmonary disease) (HCC)    Depression    Diabetic neuropathy (HCC)    DM (diabetes mellitus), type 2 (HCC)    Dyspnea    Dyspnea on exertion    GERD (gastroesophageal reflux disease)    Hematuria    Hemiparesis affecting left side as late effect of cerebrovascular accident (CVA) (HCC)    History of kidney stones    Hyperlipidemia    Hypertension    IDA (iron  deficiency anemia)    Insomnia    Neuropathy    Osteoarthritis    Osteopenia    Pneumonia 2018   Reflux    Rheumatoid arthritis (HCC)    Stroke (HCC) 2015   and again 2017  - Weakness in left leg, staggers w/ walking, vision    Tenosynovitis    Urothelial carcinoma of bladder (HCC)    Vitamin B 12 deficiency     Assessment: Patient Reported Symptoms:  Cognitive Cognitive Status: No symptoms reported   Health Maintenance Behaviors: None Healing Pattern: Average  Neurological Neurological Review of Symptoms: No symptoms reported Neurological Management Strategies: Adequate rest Neurological Self-Management Outcome: 4 (good)  HEENT HEENT Symptoms Reported: No symptoms reported HEENT Management Strategies: Routine screening HEENT Self-Management Outcome: 4 (good)    Cardiovascular Cardiovascular Symptoms Reported: No symptoms reported Does patient have uncontrolled Hypertension?: No Cardiovascular Management Strategies: Adequate rest Weight: 133 lb (60.3  kg) Cardiovascular Self-Management Outcome: 4 (good)  Respiratory Respiratory Symptoms Reported: Productive cough, Shortness of breath Other Respiratory Symptoms: sob with activity Respiratory Management Strategies: Adequate rest Respiratory Self-Management Outcome: 4 (good)  Endocrine Endocrine Symptoms Reported: Shakiness Is patient diabetic?: Yes Is patient checking blood sugars at home?: No Endocrine Self-Management Outcome: 3 (uncertain) Endocrine Comment: needs a CBG  Gastrointestinal Gastrointestinal Symptoms Reported: No symptoms reported Gastrointestinal Management Strategies: Adequate rest Gastrointestinal Self-Management Outcome: 4 (good)    Genitourinary Genitourinary Symptoms Reported: No symptoms reported Genitourinary Management Strategies: Adequate rest, Incontinence garment/pad Genitourinary Self-Management Outcome: 3 (uncertain)  Integumentary Additional Integumentary Details: carpal tunnel surgical incision Skin Management Strategies: Routine screening Skin Self-Management Outcome: 4 (good)  Musculoskeletal Musculoskelatal Symptoms Reviewed: Unsteady gait Musculoskeletal Management Strategies: Routine screening, Medication therapy Musculoskeletal Self-Management Outcome: 4 (good) Falls in the past year?: No Number of falls in past year: 1 or less Fall risk Follow up: Falls evaluation completed, Education provided, Falls prevention discussed  Psychosocial   Behavioral Management Strategies: Coping strategies Behavioral Health Self-Management Outcome: 4 (good)   Quality of Family Relationships: helpful, involved Do you feel physically threatened by others?: No    09/29/2024    PHQ2-9 Depression Screening   Little interest or pleasure in doing things    Feeling down, depressed, or hopeless    PHQ-2 - Total Score    Trouble falling or staying asleep, or sleeping too much    Feeling tired or having little energy  Poor appetite or overeating     Feeling bad  about yourself - or that you are a failure or have let yourself or your family down    Trouble concentrating on things, such as reading the newspaper or watching television    Moving or speaking so slowly that other people could have noticed.  Or the opposite - being so fidgety or restless that you have been moving around a lot more than usual    Thoughts that you would be better off dead, or hurting yourself in some way    PHQ2-9 Total Score    If you checked off any problems, how difficult have these problems made it for you to do your work, take care of things at home, or get along with other people    Depression Interventions/Treatment      Today's Vitals   09/29/24 0919  Weight: 133 lb (60.3 kg)   Pain Scale: 0-10 Pain Score: 0-No pain Pain Intervention(s): Medication (See eMAR) Multiple Pain Sites: No  Medications Reviewed Today     Reviewed by Nivia, Herb Beltre , RN (Registered Nurse) on 09/29/24 at 0915  Med List Status: <None>   Medication Order Taking? Sig Documenting Provider Last Dose Status Informant  acetaminophen  (TYLENOL ) 500 MG tablet [id number] Yes Take 500 mg by mouth every 6 (six) hours as needed for moderate pain (pain score 4-6). [provider]  Active Self  albuterol  (VENTOLIN  HFA) 108 (90 Base) MCG/ACT inhaler [id number] Yes INHALE 1-2 PUFFS INTO THE LUNGS EVERY 6 HOURS AS NEEDED Antonette Angeline ORN, NP  Active   aspirin  81 MG chewable tablet [id number] Yes Chew 81 mg by mouth daily. [provider]  Active Self  atorvastatin  (LIPITOR) 40 MG tablet [id number]  Take 1 tablet (40 mg total) by mouth at bedtime.  Patient not taking: Reported on 09/29/2024   Antonette Angeline ORN, NP  Active Self  celecoxib (CELEBREX) 200 MG capsule [id number] Yes Take 200 mg by mouth daily with breakfast. [provider]  Active Self  chlorhexidine  (PERIDEX ) 0.12 % solution 15 mL [id number]   Chesley Lendia CROME, MD  Active   Fluticasone -Umeclidin-Vilant (TRELEGY ELLIPTA )  100-62.5-25 MCG/ACT AEPB [id number] Yes Inhale 1 puff into the lungs at bedtime as needed (shortness of breath). Poggi, Norleen PARAS, MD  Active   metFORMIN  (GLUCOPHAGE ) 500 MG tablet [id number]  Take 2 tablets (1,000 mg total) by mouth 2 (two) times daily with a meal.  Patient not taking: Reported on 09/29/2024   Antonette Angeline ORN, NP  Active   Oral care mouth rinse [id number]   Chesley Lendia CROME, MD  Active   traMADol  (ULTRAM ) 50 MG tablet [id number] Yes Take 1 tablet (50 mg total) by mouth every 6 (six) hours as needed for moderate pain (pain score 4-6) or severe pain (pain score 7-10). Poggi, Norleen PARAS, MD  Active             Recommendation:   PCP Follow-up Specialty provider follow-up Dr Edie 10/31/24 Diagnostic requests: Obtain a new Blood glucose monitor Continue Current Plan of Care  Follow Up Plan:   Telephone follow-up 140days  Jai Steil RN RN Care Manager [facility] [phone]

## 2024-09-29 NOTE — Patient Instructions (Signed)
 Visit Information  Thank you for taking time to visit with me today. Please don't hesitate to contact me if I can be of assistance to you before our next scheduled appointment.  Our next appointment is by telephone on 10/13/24 at 9:30am Please call the care guide team at [phone] if you need to cancel or reschedule your appointment.   Following is a copy of your care plan:   Goals Addressed             This Visit's Progress    VBCI RN Care Plan       Problems:  Chronic Disease Management support and education needs related to DMII  Goal: Over the next 90 days the Patient will continue to work with RN Care Manager and/or Social Worker to address care management and care coordination needs related to Diabetes as evidenced by adherence to care management team scheduled appointments     demonstrate Improved health management independence as evidenced by medical record        demonstrate understanding of rationale for each prescribed medication as evidenced by adherence    take all medications exactly as prescribed and will call provider for medication related questions as evidenced by medical record      Interventions:   Diabetes Interventions: Assessed patient's understanding of A1c goal: <7% Provided education to patient about basic DM disease process Reviewed medications with patient and discussed importance of medication adherence Counseled on importance of regular laboratory monitoring as prescribed Discussed plans with patient for ongoing care management follow up and provided patient with direct contact information for care management team Provided patient with written educational materials related to hypo and hyperglycemia and importance of correct treatment Reviewed scheduled/upcoming provider appointments including: PCP 2/24 Advised patient, providing education and rationale, to check cbg 3x a day and record, calling PCP for findings outside established parameters Lab  Results  Component Value Date   HGBA1C 8.0 (H) 09/09/2024    Patient Self-Care Activities:  Call pharmacy for medication refills 3-7 days in advance of running out of medications Call provider office for new concerns or questions  Work with the social worker to address care coordination needs and will continue to work with the clinical team to address health care and disease management related needs schedule appointment with eye doctor check feet daily for cuts, sores or redness enter blood sugar readings and medication or insulin  into daily log take the blood sugar log to all doctor visits trim toenails straight across drink 6 to 8 glasses of water each day eat fish at least once per week fill half of plate with vegetables limit fast food meals to no more than 1 per week  Plan:  Follow up with provider re: 11/11/24 Telephone follow up appointment with care management team member scheduled for:  10/13/24             Please call the Suicide and Crisis Lifeline: 988 call the [place]  National Suicide Prevention Lifeline: 347-004-2776 or TTY: 325-456-7415 TTY 858-235-8713) to talk to a trained counselor if you are experiencing a Mental Health or Behavioral Health Crisis or need someone to talk to.  Patient verbalized understanding of Care plan and visit instructions communicated this visit  Evagelia Knack RN RN Care Manager [facility] [phone]    Visit Information  Thank you for taking time to visit with me today. Please don't hesitate to contact me if I can be of assistance to you before our next scheduled appointment.  Our  next appointment is by telephone on 10/13/24 at 9:30am Please call the care guide team at [phone] if you need to cancel or reschedule your appointment.   Following is a copy of your care plan:   Goals Addressed             This Visit's Progress    VBCI RN Care Plan       Problems:  Chronic Disease Management support and  education needs related to DMII  Goal: Over the next 90 days the Patient will continue to work with RN Care Manager and/or Social Worker to address care management and care coordination needs related to Diabetes as evidenced by adherence to care management team scheduled appointments     demonstrate Improved health management independence as evidenced by medical record        demonstrate understanding of rationale for each prescribed medication as evidenced by adherence    take all medications exactly as prescribed and will call provider for medication related questions as evidenced by medical record      Interventions:   Diabetes Interventions: Assessed patient's understanding of A1c goal: <7% Provided education to patient about basic DM disease process Reviewed medications with patient and discussed importance of medication adherence Counseled on importance of regular laboratory monitoring as prescribed Discussed plans with patient for ongoing care management follow up and provided patient with direct contact information for care management team Provided patient with written educational materials related to hypo and hyperglycemia and importance of correct treatment Reviewed scheduled/upcoming provider appointments including: PCP 2/24 Advised patient, providing education and rationale, to check cbg 3x a day and record, calling PCP for findings outside established parameters Lab Results  Component Value Date   HGBA1C 8.0 (H) 09/09/2024    Patient Self-Care Activities:  Call pharmacy for medication refills 3-7 days in advance of running out of medications Call provider office for new concerns or questions  Work with the social worker to address care coordination needs and will continue to work with the clinical team to address health care and disease management related needs schedule appointment with eye doctor check feet daily for cuts, sores or redness enter blood sugar readings and  medication or insulin  into daily log take the blood sugar log to all doctor visits trim toenails straight across drink 6 to 8 glasses of water each day eat fish at least once per week fill half of plate with vegetables limit fast food meals to no more than 1 per week  Plan:  Follow up with provider re: 11/11/24 Telephone follow up appointment with care management team member scheduled for:  10/13/24             Please call the Suicide and Crisis Lifeline: 988 call the [place]  National Suicide Prevention Lifeline: (782)423-6845 or TTY: 873-677-0086 TTY 978-489-4427) to talk to a trained counselor call 1-800-273-TALK (toll free, 24 hour hotline) if you are experiencing a Mental Health or Behavioral Health Crisis or need someone to talk to.  Patient verbalized understanding of Care plan and visit instructions communicated this visit  Tyger Wichman RN RN Care Manager [name] [phone]

## 2024-10-02 ENCOUNTER — Encounter

## 2024-10-02 ENCOUNTER — Other Ambulatory Visit

## 2024-10-06 ENCOUNTER — Ambulatory Visit: Admission: RE | Admit: 2024-10-06 | Source: Ambulatory Visit

## 2024-10-06 ENCOUNTER — Encounter: Payer: Self-pay | Admitting: Oncology

## 2024-10-13 ENCOUNTER — Other Ambulatory Visit: Payer: Self-pay

## 2024-10-13 NOTE — Patient Instructions (Signed)
 [name] - I am sorry I was unable to reach you today for our scheduled appointment. I work with Antonette Angeline ORN, NP and am calling to support your healthcare needs. Please contact me at [phone] at your earliest convenience. I look forward to speaking with you soon.   Thank you,  Enzley Kitchens  Administrator, [employer] [phone]

## 2024-10-15 ENCOUNTER — Other Ambulatory Visit: Payer: Self-pay

## 2024-10-15 NOTE — Patient Instructions (Signed)
 Visit Information  Thank you for taking time to visit with me today. Please don't hesitate to contact me if I can be of assistance to you before our next scheduled appointment.  Your next care management appointment is by telephone on 10/27/24 at 1130   Please call the care guide team at [phone] if you need to cancel, schedule, or reschedule an appointment.   Please call the Suicide and Crisis Lifeline: 988 call the [place]  National Suicide Prevention Lifeline: 760-154-6188 or TTY: 908-728-6539 TTY 984-107-3672) to talk to a trained counselor call 1-800-273-TALK (toll free, 24 hour hotline) call 911 if you are experiencing a Mental Health or Behavioral Health Crisis or need someone to talk to.  Thersia Hoar, HEDWIG, MHA [place]  [facility] Social Worker, Population Health [phone]

## 2024-10-15 NOTE — Patient Outreach (Signed)
 Social Drivers of Health  Community Resource and Care Coordination Visit Note   10/15/2024  Name: Kendra [name] MRN: [id number] DOB:05-May-1946  Situation: Referral received for [facility] needs assessment and assistance related to Eye resources. I obtained verbal consent from Patient.  Visit completed with Patient on the phone.   Background:      Assessment:   Goals Addressed             This Visit's Progress    BSW Goals       Current SDOH Barriers:  Cover and Pillow for home Food resources Eye appointment  Interventions: SW mailed resources for assistance with household items and food pantries. SW mailed resources for eye doctor in [facility] [place], [place], [place]  Value Based [facility] Social Worker, Population Health [phone]           Recommendation:   Use eye resources SW provided to schedule an appointment.  Follow Up Plan:   Telephone follow-up 10/27/24 at 1130  Thersia Hoar, [place], [place] [facility]  Value Based [facility] Social Worker, Population Health [phone]

## 2024-10-17 ENCOUNTER — Other Ambulatory Visit: Payer: Self-pay

## 2024-10-22 ENCOUNTER — Other Ambulatory Visit: Payer: Self-pay

## 2024-10-22 NOTE — Patient Instructions (Signed)
 Benigna A Spera - I have attempted to call you three times but have been unsuccessful in reaching you. I work with Antonette Angeline ORN, NP and am calling to support your healthcare needs. If I can be of assistance to you, please contact me at [phone].     Thank you,  Gurney Balthazor  Administrator, [employer] [phone]

## 2024-10-22 NOTE — Patient Outreach (Signed)
 Complex Care Management   Visit Note  10/22/2024  Name:  Kendra [name] MRN: [id number] DOB: 06/12/1946  Situation: Referral received for Complex Care Management related to Heart Failure, COPD, and Diabetes with Complications I obtained verbal consent from Patient.  Visit completed with Patient  on the phone  Background:   Past Medical History:  Diagnosis Date   Anginal pain    Aortic atherosclerosis    Asthma    Back pain    Bladder tumor    Carotid artery stenosis    Carpal tunnel syndrome of right wrist    Cataract    Cervical disc disorder    COPD (chronic obstructive pulmonary disease) (HCC)    Depression    Diabetic neuropathy (HCC)    DM (diabetes mellitus), type 2 (HCC)    Dyspnea    Dyspnea on exertion    GERD (gastroesophageal reflux disease)    Hematuria    Hemiparesis affecting left side as late effect of cerebrovascular accident (CVA) (HCC)    History of kidney stones    Hyperlipidemia    Hypertension    IDA (iron  deficiency anemia)    Insomnia    Neuropathy    Osteoarthritis    Osteopenia    Pneumonia 2018   Reflux    Rheumatoid arthritis (HCC)    Stroke (HCC) 2015   and again 2017  - Weakness in left leg, staggers w/ walking, vision    Tenosynovitis    Urothelial carcinoma of bladder (HCC)    Vitamin B 12 deficiency     Assessment: Patient Reported Symptoms:  Cognitive        Neurological      HEENT        Cardiovascular      Respiratory      Endocrine      Gastrointestinal        Genitourinary      Integumentary      Musculoskeletal          Psychosocial            10/22/2024    PHQ2-9 Depression Screening   Little interest or pleasure in doing things    Feeling down, depressed, or hopeless    PHQ-2 - Total Score    Trouble falling or staying asleep, or sleeping too much    Feeling tired or having little energy    Poor appetite or overeating     Feeling bad about yourself - or that you are a failure or have let  yourself or your family down    Trouble concentrating on things, such as reading the newspaper or watching television    Moving or speaking so slowly that other people could have noticed.  Or the opposite - being so fidgety or restless that you have been moving around a lot more than usual    Thoughts that you would be better off dead, or hurting yourself in some way    PHQ2-9 Total Score    If you checked off any problems, how difficult have these problems made it for you to do your work, take care of things at home, or get along with other people    Depression Interventions/Treatment      There were no vitals filed for this visit.    Medications Reviewed Today   Medications were not reviewed in this encounter     Recommendation:   Continue Current Plan of Care  Follow Up Plan:   We have been  unable to make contact with the patient x 3 attempts. Closing from Complex Care Management Ilona Colley RN RN Care Manager [facility] [phone]

## 2024-10-27 ENCOUNTER — Telehealth

## 2024-11-11 ENCOUNTER — Ambulatory Visit: Admitting: Internal Medicine

## 2025-02-10 ENCOUNTER — Other Ambulatory Visit: Admitting: Urology

## 2025-09-09 ENCOUNTER — Ambulatory Visit
